# Patient Record
Sex: Male | Born: 1940
Health system: Southern US, Community
[De-identification: ages and names within clinical notes are randomized; demographics above are authoritative.]

## PROBLEM LIST (undated history)

## (undated) DIAGNOSIS — J42 Unspecified chronic bronchitis: Secondary | ICD-10-CM

## (undated) DIAGNOSIS — G4733 Obstructive sleep apnea (adult) (pediatric): Secondary | ICD-10-CM

## (undated) DIAGNOSIS — Z9289 Personal history of other medical treatment: Secondary | ICD-10-CM

## (undated) DIAGNOSIS — I10 Essential (primary) hypertension: Secondary | ICD-10-CM

## (undated) DIAGNOSIS — Z8719 Personal history of other diseases of the digestive system: Secondary | ICD-10-CM

## (undated) DIAGNOSIS — N183 Chronic kidney disease, stage 3 unspecified: Secondary | ICD-10-CM

## (undated) DIAGNOSIS — E669 Obesity, unspecified: Secondary | ICD-10-CM

## (undated) DIAGNOSIS — I5032 Chronic diastolic (congestive) heart failure: Secondary | ICD-10-CM

## (undated) DIAGNOSIS — Z9989 Dependence on other enabling machines and devices: Secondary | ICD-10-CM

## (undated) DIAGNOSIS — M4800 Spinal stenosis, site unspecified: Secondary | ICD-10-CM

## (undated) DIAGNOSIS — E119 Type 2 diabetes mellitus without complications: Secondary | ICD-10-CM

## (undated) DIAGNOSIS — E1142 Type 2 diabetes mellitus with diabetic polyneuropathy: Secondary | ICD-10-CM

## (undated) DIAGNOSIS — I219 Acute myocardial infarction, unspecified: Secondary | ICD-10-CM

## (undated) DIAGNOSIS — R9430 Abnormal result of cardiovascular function study, unspecified: Secondary | ICD-10-CM

## (undated) DIAGNOSIS — M545 Low back pain, unspecified: Secondary | ICD-10-CM

## (undated) DIAGNOSIS — M542 Cervicalgia: Secondary | ICD-10-CM

## (undated) DIAGNOSIS — K219 Gastro-esophageal reflux disease without esophagitis: Secondary | ICD-10-CM

## (undated) DIAGNOSIS — G629 Polyneuropathy, unspecified: Secondary | ICD-10-CM

## (undated) DIAGNOSIS — E785 Hyperlipidemia, unspecified: Secondary | ICD-10-CM

## (undated) DIAGNOSIS — M1990 Unspecified osteoarthritis, unspecified site: Secondary | ICD-10-CM

## (undated) DIAGNOSIS — I714 Abdominal aortic aneurysm, without rupture: Secondary | ICD-10-CM

## (undated) DIAGNOSIS — G8929 Other chronic pain: Secondary | ICD-10-CM

## (undated) DIAGNOSIS — N200 Calculus of kidney: Secondary | ICD-10-CM

## (undated) DIAGNOSIS — C4362 Malignant melanoma of left upper limb, including shoulder: Secondary | ICD-10-CM

## (undated) DIAGNOSIS — I2510 Atherosclerotic heart disease of native coronary artery without angina pectoris: Secondary | ICD-10-CM

## (undated) DIAGNOSIS — M48061 Spinal stenosis, lumbar region without neurogenic claudication: Secondary | ICD-10-CM

## (undated) DIAGNOSIS — R7989 Other specified abnormal findings of blood chemistry: Secondary | ICD-10-CM

## (undated) DIAGNOSIS — N289 Disorder of kidney and ureter, unspecified: Secondary | ICD-10-CM

## (undated) DIAGNOSIS — IMO0002 Reserved for concepts with insufficient information to code with codable children: Secondary | ICD-10-CM

## (undated) DIAGNOSIS — C4490 Unspecified malignant neoplasm of skin, unspecified: Secondary | ICD-10-CM

## (undated) DIAGNOSIS — Z87442 Personal history of urinary calculi: Secondary | ICD-10-CM

## (undated) DIAGNOSIS — K439 Ventral hernia without obstruction or gangrene: Secondary | ICD-10-CM

## (undated) HISTORY — DX: Reserved for concepts with insufficient information to code with codable children: IMO0002

## (undated) HISTORY — PX: CERVICAL DISC SURGERY: SHX588

## (undated) HISTORY — PX: NASAL SINUS SURGERY: SHX719

## (undated) HISTORY — DX: Unspecified malignant neoplasm of skin, unspecified: C44.90

## (undated) HISTORY — PX: KNEE ARTHROSCOPY: SUR90

## (undated) HISTORY — PX: CATARACT EXTRACTION W/ INTRAOCULAR LENS  IMPLANT, BILATERAL: SHX1307

## (undated) HISTORY — PX: CARPAL TUNNEL RELEASE: SHX101

## (undated) HISTORY — PX: BACK SURGERY: SHX140

## (undated) HISTORY — DX: Obesity, unspecified: E66.9

## (undated) HISTORY — DX: Ventral hernia without obstruction or gangrene: K43.9

## (undated) HISTORY — DX: Abnormal result of cardiovascular function study, unspecified: R94.30

## (undated) HISTORY — DX: Calculus of kidney: N20.0

## (undated) HISTORY — DX: Hyperlipidemia, unspecified: E78.5

## (undated) HISTORY — DX: Personal history of other diseases of the digestive system: Z87.19

## (undated) HISTORY — DX: Disorder of kidney and ureter, unspecified: N28.9

## (undated) HISTORY — PX: EYE SURGERY: SHX253

## (undated) HISTORY — DX: Atherosclerotic heart disease of native coronary artery without angina pectoris: I25.10

## (undated) HISTORY — DX: Essential (primary) hypertension: I10

---
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
## 1998-10-12 HISTORY — PX: CORONARY ANGIOPLASTY WITH STENT PLACEMENT: SHX49

## 1999-03-17 ENCOUNTER — Ambulatory Visit (HOSPITAL_COMMUNITY): Admission: RE | Admit: 1999-03-17 | Discharge: 1999-03-17 | Payer: Self-pay | Admitting: *Deleted

## 1999-03-17 ENCOUNTER — Encounter: Payer: Self-pay | Admitting: *Deleted

## 1999-05-24 ENCOUNTER — Encounter: Payer: Self-pay | Admitting: Emergency Medicine

## 1999-05-25 ENCOUNTER — Inpatient Hospital Stay (HOSPITAL_COMMUNITY): Admission: EM | Admit: 1999-05-25 | Discharge: 1999-05-28 | Payer: Self-pay | Admitting: Emergency Medicine

## 1999-07-24 ENCOUNTER — Ambulatory Visit: Admission: RE | Admit: 1999-07-24 | Discharge: 1999-07-24 | Payer: Self-pay | Admitting: Cardiology

## 1999-07-24 ENCOUNTER — Encounter (INDEPENDENT_AMBULATORY_CARE_PROVIDER_SITE_OTHER): Payer: Self-pay | Admitting: *Deleted

## 2000-07-28 ENCOUNTER — Inpatient Hospital Stay (HOSPITAL_COMMUNITY): Admission: EM | Admit: 2000-07-28 | Discharge: 2000-07-31 | Payer: Self-pay | Admitting: Emergency Medicine

## 2005-02-09 ENCOUNTER — Ambulatory Visit: Payer: Self-pay | Admitting: Cardiology

## 2005-02-11 ENCOUNTER — Ambulatory Visit: Payer: Self-pay | Admitting: Cardiology

## 2005-02-18 ENCOUNTER — Ambulatory Visit: Payer: Self-pay

## 2005-03-13 ENCOUNTER — Ambulatory Visit: Payer: Self-pay | Admitting: Cardiology

## 2005-03-19 ENCOUNTER — Ambulatory Visit: Payer: Self-pay | Admitting: Internal Medicine

## 2005-03-24 ENCOUNTER — Ambulatory Visit (HOSPITAL_COMMUNITY): Admission: RE | Admit: 2005-03-24 | Discharge: 2005-03-24 | Payer: Self-pay | Admitting: Internal Medicine

## 2005-04-09 ENCOUNTER — Ambulatory Visit: Payer: Self-pay | Admitting: Internal Medicine

## 2005-04-24 ENCOUNTER — Encounter: Admission: RE | Admit: 2005-04-24 | Discharge: 2005-04-24 | Payer: Self-pay | Admitting: Internal Medicine

## 2005-05-20 ENCOUNTER — Encounter: Admission: RE | Admit: 2005-05-20 | Discharge: 2005-05-20 | Payer: Self-pay | Admitting: Internal Medicine

## 2005-06-26 ENCOUNTER — Ambulatory Visit: Payer: Self-pay | Admitting: *Deleted

## 2005-06-26 ENCOUNTER — Inpatient Hospital Stay (HOSPITAL_COMMUNITY): Admission: EM | Admit: 2005-06-26 | Discharge: 2005-07-01 | Payer: Self-pay | Admitting: Emergency Medicine

## 2005-07-01 ENCOUNTER — Ambulatory Visit: Payer: Self-pay | Admitting: Emergency Medicine

## 2005-07-08 ENCOUNTER — Ambulatory Visit: Payer: Self-pay | Admitting: Cardiology

## 2005-07-16 ENCOUNTER — Ambulatory Visit: Payer: Self-pay | Admitting: Internal Medicine

## 2005-07-20 ENCOUNTER — Encounter: Admission: RE | Admit: 2005-07-20 | Discharge: 2005-07-20 | Payer: Self-pay | Admitting: Internal Medicine

## 2005-07-21 ENCOUNTER — Ambulatory Visit (HOSPITAL_BASED_OUTPATIENT_CLINIC_OR_DEPARTMENT_OTHER): Admission: RE | Admit: 2005-07-21 | Discharge: 2005-07-21 | Payer: Self-pay | Admitting: Emergency Medicine

## 2005-07-21 ENCOUNTER — Encounter: Payer: Self-pay | Admitting: Emergency Medicine

## 2005-07-23 ENCOUNTER — Ambulatory Visit: Payer: Self-pay | Admitting: Emergency Medicine

## 2005-08-04 ENCOUNTER — Ambulatory Visit: Payer: Self-pay | Admitting: Emergency Medicine

## 2005-08-31 ENCOUNTER — Ambulatory Visit (HOSPITAL_COMMUNITY): Admission: RE | Admit: 2005-08-31 | Discharge: 2005-08-31 | Payer: Self-pay | Admitting: Neurological Surgery

## 2006-02-08 ENCOUNTER — Ambulatory Visit: Payer: Self-pay | Admitting: Emergency Medicine

## 2006-03-01 ENCOUNTER — Ambulatory Visit: Payer: Self-pay | Admitting: Cardiology

## 2006-03-30 ENCOUNTER — Ambulatory Visit: Payer: Self-pay | Admitting: Cardiology

## 2006-07-01 ENCOUNTER — Ambulatory Visit: Payer: Self-pay | Admitting: Cardiology

## 2006-07-01 ENCOUNTER — Inpatient Hospital Stay (HOSPITAL_COMMUNITY): Admission: EM | Admit: 2006-07-01 | Discharge: 2006-07-03 | Payer: Self-pay | Admitting: Emergency Medicine

## 2006-07-12 ENCOUNTER — Ambulatory Visit: Payer: Self-pay

## 2006-07-15 ENCOUNTER — Ambulatory Visit: Payer: Self-pay | Admitting: Cardiology

## 2006-07-22 ENCOUNTER — Ambulatory Visit: Payer: Self-pay | Admitting: Cardiology

## 2006-07-22 LAB — CONVERTED CEMR LAB
BUN: 35 mg/dL — ABNORMAL HIGH (ref 6–23)
CO2: 30 meq/L (ref 19–32)
Calcium: 9.5 mg/dL (ref 8.4–10.5)
Chloride: 105 meq/L (ref 96–112)
GFR calc non Af Amer: 54 mL/min
Glomerular Filtration Rate, Af Am: 65 mL/min/{1.73_m2}

## 2006-07-26 ENCOUNTER — Ambulatory Visit: Payer: Self-pay | Admitting: Cardiology

## 2006-08-26 ENCOUNTER — Ambulatory Visit: Payer: Self-pay | Admitting: Cardiology

## 2006-09-17 ENCOUNTER — Ambulatory Visit: Payer: Self-pay | Admitting: Cardiology

## 2006-09-17 LAB — CONVERTED CEMR LAB
Albumin: 3.9 g/dL (ref 3.5–5.2)
Chol/HDL Ratio, serum: 5.7
HDL: 28.4 mg/dL — ABNORMAL LOW (ref >39.0)
LDL DIRECT: 95.3 mg/dL
Total Bilirubin: 0.8 mg/dL (ref 0.3–1.2)
Total Protein: 7.3 g/dL (ref 6.0–8.3)
Triglyceride fasting, serum: 203 mg/dL (ref 0–149)

## 2006-09-22 ENCOUNTER — Ambulatory Visit: Payer: Self-pay | Admitting: Cardiology

## 2006-12-28 ENCOUNTER — Ambulatory Visit: Payer: Self-pay | Admitting: Cardiology

## 2007-01-25 ENCOUNTER — Ambulatory Visit: Payer: Self-pay | Admitting: Emergency Medicine

## 2007-02-02 ENCOUNTER — Ambulatory Visit: Payer: Self-pay | Admitting: Cardiology

## 2007-03-17 ENCOUNTER — Ambulatory Visit: Payer: Self-pay | Admitting: Cardiology

## 2007-03-17 LAB — CONVERTED CEMR LAB
ALT: 31 units/L (ref 0–40)
AST: 22 units/L (ref 0–37)
Bilirubin, Direct: 0.1 mg/dL (ref 0.0–0.3)
Cholesterol: 205 mg/dL (ref 0–200)
Total Protein: 7.1 g/dL (ref 6.0–8.3)

## 2007-03-22 ENCOUNTER — Ambulatory Visit: Payer: Self-pay | Admitting: Cardiology

## 2007-04-06 ENCOUNTER — Ambulatory Visit: Payer: Self-pay | Admitting: Cardiology

## 2007-04-06 LAB — CONVERTED CEMR LAB
GFR calc Af Amer: 71 mL/min
GFR calc non Af Amer: 59 mL/min
Glucose, Bld: 176 mg/dL — ABNORMAL HIGH (ref 70–99)
Potassium: 3.7 meq/L (ref 3.5–5.1)
Sodium: 142 meq/L (ref 135–145)

## 2007-06-02 ENCOUNTER — Ambulatory Visit: Payer: Self-pay | Admitting: Cardiology

## 2007-09-21 ENCOUNTER — Telehealth: Payer: Self-pay | Admitting: Internal Medicine

## 2007-09-21 ENCOUNTER — Encounter: Payer: Self-pay | Admitting: Internal Medicine

## 2007-10-18 ENCOUNTER — Encounter: Payer: Self-pay | Admitting: Internal Medicine

## 2007-10-21 ENCOUNTER — Encounter: Payer: Self-pay | Admitting: Internal Medicine

## 2007-10-21 ENCOUNTER — Ambulatory Visit: Payer: Self-pay | Admitting: Internal Medicine

## 2007-10-21 DIAGNOSIS — M109 Gout, unspecified: Secondary | ICD-10-CM

## 2007-10-21 DIAGNOSIS — R5381 Other malaise: Secondary | ICD-10-CM

## 2007-10-21 DIAGNOSIS — R5383 Other fatigue: Secondary | ICD-10-CM

## 2007-10-21 DIAGNOSIS — Z87442 Personal history of urinary calculi: Secondary | ICD-10-CM

## 2007-10-21 DIAGNOSIS — K449 Diaphragmatic hernia without obstruction or gangrene: Secondary | ICD-10-CM | POA: Insufficient documentation

## 2007-10-21 DIAGNOSIS — Z85828 Personal history of other malignant neoplasm of skin: Secondary | ICD-10-CM

## 2007-10-21 DIAGNOSIS — J329 Chronic sinusitis, unspecified: Secondary | ICD-10-CM | POA: Insufficient documentation

## 2007-10-21 DIAGNOSIS — K439 Ventral hernia without obstruction or gangrene: Secondary | ICD-10-CM | POA: Insufficient documentation

## 2007-10-21 DIAGNOSIS — F329 Major depressive disorder, single episode, unspecified: Secondary | ICD-10-CM

## 2007-10-28 ENCOUNTER — Ambulatory Visit: Payer: Self-pay | Admitting: Gastroenterology

## 2007-11-11 ENCOUNTER — Ambulatory Visit: Payer: Self-pay | Admitting: Gastroenterology

## 2007-11-11 ENCOUNTER — Encounter: Payer: Self-pay | Admitting: Gastroenterology

## 2007-11-11 ENCOUNTER — Encounter: Payer: Self-pay | Admitting: Internal Medicine

## 2007-11-23 ENCOUNTER — Ambulatory Visit: Payer: Self-pay | Admitting: Cardiology

## 2007-12-06 ENCOUNTER — Telehealth: Payer: Self-pay | Admitting: Internal Medicine

## 2008-02-29 ENCOUNTER — Ambulatory Visit: Payer: Self-pay | Admitting: Cardiology

## 2008-02-29 LAB — CONVERTED CEMR LAB
ALT: 43 units/L (ref 0–53)
AST: 32 units/L (ref 0–37)
Albumin: 4.2 g/dL (ref 3.5–5.2)
BUN: 26 mg/dL — ABNORMAL HIGH (ref 6–23)
Basophils Relative: 0.4 % (ref 0.0–1.0)
Chloride: 101 meq/L (ref 96–112)
Creatinine, Ser: 1.6 mg/dL — ABNORMAL HIGH (ref 0.4–1.5)
Direct LDL: 72.8 mg/dL
Eosinophils Absolute: 0.1 10*3/uL (ref 0.0–0.7)
Eosinophils Relative: 2 % (ref 0.0–5.0)
GFR calc non Af Amer: 46 mL/min
HCT: 41.4 % (ref 39.0–52.0)
MCV: 93.4 fL (ref 78.0–100.0)
Neutrophils Relative %: 48.8 % (ref 43.0–77.0)
RBC: 4.43 M/uL (ref 4.22–5.81)
Total Protein: 7.9 g/dL (ref 6.0–8.3)
WBC: 5.8 10*3/uL (ref 4.5–10.5)

## 2008-03-09 ENCOUNTER — Ambulatory Visit: Payer: Self-pay

## 2008-04-09 ENCOUNTER — Ambulatory Visit: Payer: Self-pay | Admitting: Cardiology

## 2008-04-25 ENCOUNTER — Ambulatory Visit: Payer: Self-pay | Admitting: Internal Medicine

## 2008-04-25 DIAGNOSIS — R7989 Other specified abnormal findings of blood chemistry: Secondary | ICD-10-CM | POA: Insufficient documentation

## 2008-04-25 LAB — CONVERTED CEMR LAB: Hgb A1c MFr Bld: 10.1 % — ABNORMAL HIGH (ref 4.6–6.0)

## 2008-04-29 ENCOUNTER — Encounter: Payer: Self-pay | Admitting: Internal Medicine

## 2008-05-07 ENCOUNTER — Telehealth: Payer: Self-pay | Admitting: Internal Medicine

## 2008-05-10 ENCOUNTER — Encounter: Admission: RE | Admit: 2008-05-10 | Discharge: 2008-05-10 | Payer: Self-pay | Admitting: Internal Medicine

## 2008-05-11 ENCOUNTER — Encounter: Payer: Self-pay | Admitting: Internal Medicine

## 2008-05-21 ENCOUNTER — Telehealth: Payer: Self-pay | Admitting: Internal Medicine

## 2008-05-23 ENCOUNTER — Ambulatory Visit: Payer: Self-pay | Admitting: Internal Medicine

## 2008-07-25 ENCOUNTER — Ambulatory Visit: Payer: Self-pay | Admitting: Cardiology

## 2008-08-23 ENCOUNTER — Ambulatory Visit: Payer: Self-pay | Admitting: Internal Medicine

## 2008-08-26 ENCOUNTER — Telehealth: Payer: Self-pay | Admitting: Internal Medicine

## 2008-09-17 ENCOUNTER — Encounter: Payer: Self-pay | Admitting: Internal Medicine

## 2008-09-27 ENCOUNTER — Telehealth: Payer: Self-pay | Admitting: Internal Medicine

## 2009-01-22 DIAGNOSIS — R609 Edema, unspecified: Secondary | ICD-10-CM

## 2009-01-22 DIAGNOSIS — E663 Overweight: Secondary | ICD-10-CM

## 2009-01-23 ENCOUNTER — Encounter: Payer: Self-pay | Admitting: Cardiology

## 2009-01-23 ENCOUNTER — Ambulatory Visit: Payer: Self-pay | Admitting: Cardiology

## 2009-03-20 ENCOUNTER — Ambulatory Visit: Payer: Self-pay | Admitting: Cardiology

## 2009-05-30 ENCOUNTER — Ambulatory Visit: Payer: Self-pay | Admitting: Internal Medicine

## 2009-05-30 ENCOUNTER — Encounter: Payer: Self-pay | Admitting: Internal Medicine

## 2009-05-30 DIAGNOSIS — Z8709 Personal history of other diseases of the respiratory system: Secondary | ICD-10-CM | POA: Insufficient documentation

## 2009-05-30 DIAGNOSIS — J168 Pneumonia due to other specified infectious organisms: Secondary | ICD-10-CM

## 2009-05-30 DIAGNOSIS — R209 Unspecified disturbances of skin sensation: Secondary | ICD-10-CM

## 2009-05-30 LAB — CONVERTED CEMR LAB
Albumin: 3.9 g/dL (ref 3.5–5.2)
Alkaline Phosphatase: 88 units/L (ref 39–117)
BUN: 21 mg/dL (ref 6–23)
Basophils Absolute: 0 10*3/uL (ref 0.0–0.1)
Bilirubin, Direct: 0.1 mg/dL (ref 0.0–0.3)
CO2: 29 meq/L (ref 19–32)
Calcium: 9.3 mg/dL (ref 8.4–10.5)
Cholesterol: 163 mg/dL (ref 0–200)
Creatinine, Ser: 1.2 mg/dL (ref 0.4–1.5)
Eosinophils Absolute: 0.1 10*3/uL (ref 0.0–0.7)
Folate: 8.7 ng/mL
Glucose, Bld: 168 mg/dL — ABNORMAL HIGH (ref 70–99)
HDL goal, serum: 40 mg/dL
Hemoglobin, Urine: NEGATIVE
Hgb A1c MFr Bld: 6.7 % — ABNORMAL HIGH (ref 4.6–6.5)
Leukocytes, UA: NEGATIVE
Lymphocytes Relative: 27.1 % (ref 12.0–46.0)
MCHC: 34.7 g/dL (ref 30.0–36.0)
Monocytes Absolute: 0.6 10*3/uL (ref 0.1–1.0)
Neutrophils Relative %: 63.8 % (ref 43.0–77.0)
Nitrite: NEGATIVE
RDW: 13 % (ref 11.5–14.6)
Total Protein, Urine: NEGATIVE mg/dL
Triglycerides: 239 mg/dL — ABNORMAL HIGH (ref 0.0–149.0)
Vit D, 25-Hydroxy: 27 ng/mL — ABNORMAL LOW (ref 30–89)
Vitamin B-12: 355 pg/mL (ref 211–911)
pH: 5.5 (ref 5.0–8.0)

## 2009-05-31 ENCOUNTER — Encounter: Payer: Self-pay | Admitting: Internal Medicine

## 2009-06-05 ENCOUNTER — Telehealth: Payer: Self-pay | Admitting: Internal Medicine

## 2009-08-13 ENCOUNTER — Telehealth (INDEPENDENT_AMBULATORY_CARE_PROVIDER_SITE_OTHER): Payer: Self-pay | Admitting: *Deleted

## 2009-08-19 ENCOUNTER — Telehealth: Payer: Self-pay | Admitting: Internal Medicine

## 2009-09-17 ENCOUNTER — Encounter: Payer: Self-pay | Admitting: Cardiology

## 2009-09-18 ENCOUNTER — Ambulatory Visit: Payer: Self-pay | Admitting: Cardiology

## 2009-11-25 ENCOUNTER — Telehealth: Payer: Self-pay | Admitting: Cardiology

## 2009-12-24 ENCOUNTER — Ambulatory Visit: Payer: Self-pay | Admitting: Cardiology

## 2010-01-01 ENCOUNTER — Ambulatory Visit: Payer: Self-pay | Admitting: Cardiology

## 2010-01-22 LAB — CONVERTED CEMR LAB
Cholesterol: 165 mg/dL (ref 0–200)
Direct LDL: 85.9 mg/dL
HDL: 30.1 mg/dL — ABNORMAL LOW (ref >39.00)
Hgb A1c MFr Bld: 6.7 % — ABNORMAL HIGH (ref 4.6–6.5)
Total CHOL/HDL Ratio: 5
VLDL: 60.2 mg/dL — ABNORMAL HIGH (ref 0.0–40.0)

## 2010-03-06 ENCOUNTER — Ambulatory Visit: Payer: Self-pay | Admitting: Internal Medicine

## 2010-03-18 ENCOUNTER — Telehealth: Payer: Self-pay | Admitting: Internal Medicine

## 2010-08-04 ENCOUNTER — Ambulatory Visit: Payer: Self-pay | Admitting: Internal Medicine

## 2010-08-04 ENCOUNTER — Inpatient Hospital Stay (HOSPITAL_COMMUNITY): Admission: EM | Admit: 2010-08-04 | Discharge: 2010-08-08 | Payer: Self-pay | Admitting: Emergency Medicine

## 2010-08-04 ENCOUNTER — Ambulatory Visit: Payer: Self-pay | Admitting: Pulmonary Disease

## 2010-08-04 ENCOUNTER — Telehealth: Payer: Self-pay | Admitting: Cardiology

## 2010-08-05 ENCOUNTER — Ambulatory Visit: Payer: Self-pay | Admitting: Surgery

## 2010-08-05 ENCOUNTER — Encounter: Payer: Self-pay | Admitting: Internal Medicine

## 2010-08-07 ENCOUNTER — Encounter: Payer: Self-pay | Admitting: Pulmonary Disease

## 2010-08-15 ENCOUNTER — Telehealth (INDEPENDENT_AMBULATORY_CARE_PROVIDER_SITE_OTHER): Payer: Self-pay | Admitting: *Deleted

## 2010-08-20 ENCOUNTER — Encounter: Payer: Self-pay | Admitting: Pulmonary Disease

## 2010-08-25 ENCOUNTER — Ambulatory Visit: Payer: Self-pay | Admitting: Pulmonary Disease

## 2010-08-27 ENCOUNTER — Encounter: Payer: Self-pay | Admitting: Cardiology

## 2010-08-28 ENCOUNTER — Ambulatory Visit: Payer: Self-pay | Admitting: Cardiology

## 2010-08-28 LAB — CONVERTED CEMR LAB
CO2: 27 meq/L (ref 19–32)
Calcium: 9.7 mg/dL (ref 8.4–10.5)
Creatinine, Ser: 1.6 mg/dL — ABNORMAL HIGH (ref 0.4–1.5)
GFR calc non Af Amer: 46.94 mL/min (ref >60)
Sodium: 137 meq/L (ref 135–145)

## 2010-08-29 ENCOUNTER — Ambulatory Visit: Payer: Self-pay | Admitting: Internal Medicine

## 2010-09-02 ENCOUNTER — Encounter: Payer: Self-pay | Admitting: Internal Medicine

## 2010-09-02 LAB — CONVERTED CEMR LAB
Collection Interval-CRCL: 24 hr
Creatinine 24 HR UR: 2599 mg/24hr — ABNORMAL HIGH (ref 800–2000)
Creatinine, Urine: 104.2 mg/dL

## 2010-09-09 ENCOUNTER — Telehealth: Payer: Self-pay | Admitting: Internal Medicine

## 2010-09-14 ENCOUNTER — Encounter: Payer: Self-pay | Admitting: Internal Medicine

## 2010-09-28 ENCOUNTER — Encounter: Payer: Self-pay | Admitting: Pulmonary Disease

## 2010-11-11 NOTE — Assessment & Plan Note (Signed)
Summary: PER CHECK OUT/SF      Allergies Added:   Visit Type:  Follow-up Primary Provider:  Jacques Navy MD  CC:   CAD.  History of Present Illness: The patient is seen for followup of coronary artery disease.  He has not been having any chest pain.  He continues to work.  He has good LV function by history but he has not had an echo in many years.  We know that his LV function was good at the time of his cath.  He had nuclear scan in May of 2009 showing no ischemia.  I saw him last December, 2010.  At that time we decided to start him on fenofibrate.  He is tolerated well.  We will arrange for followup lipid testing.  Current Medications (verified): 1)  Furosemide 80 Mg Tabs (Furosemide) .... Take 1 Tablet By Mouth Two Times A Day 2)  Bayer Aspirin 325 Mg  Tabs (Aspirin) .... Take 1 Tablet By Mouth Once A Day 3)  Fish Oil 1000 Mg  Caps (Omega-3 Fatty Acids) .... Take 2 Tablets By Mouth Once A Day 4)  Protonix 40 Mg  Pack (Pantoprazole Sodium) .... Take 1 Tablet By Mouth Once A Day 5)  Tylenol Extra Strength 500 Mg  Tabs (Acetaminophen) .... Take 2 Tablet By Mouth Two Times A Day 6)  Diltiazem Hcl Cr 180 Mg  Cp24 (Diltiazem Hcl) .... Take 1 Tablet By Mouth Once A Day 7)  Atenolol 50 Mg  Tabs (Atenolol) .... Take 1 Tablet By Mouth Once A Day 8)  Imdur 30 Mg  Tb24 (Isosorbide Mononitrate) .... Take 1 Tablet By Mouth Once A Day 9)  Nitroglycerin 0.4 Mg Subl (Nitroglycerin) .... One Tablet Under Tongue Every 5 Minutes As Needed For Chest Pain---May Repeat Times Three 10)  Indomethacin 50 Mg  Caps (Indomethacin) .... Take 1 Tablet By Mouth Three Times A Day As Needed 11)  Allopurinol 300 Mg  Tabs (Allopurinol) .Marland Kitchen.. 1 Once Daily 12)  Amaryl 2 Mg  Tabs (Glimepiride) .Marland Kitchen.. 1 Two Times A Day 13)  Freestyle Freedom Lite W/device Kit (Blood Glucose Monitoring Suppl) .... Use Two Times A Day As Directed 14)  Freestyle Lite Test  Strp (Glucose Blood) .... Use Two Times A Day As Directed 15)   Vitamin D 1000 Unit  Tabs (Cholecalciferol) .... Once Daily 16)  Fenofibrate 54 Mg Tabs (Fenofibrate) .... Take One Tablet By Mouth Daily With A Meal  Allergies (verified): 1)  ! Codeine  Past History:  Past Medical History: Last updated: 12/24/2009 OVERWEIGHT/OBESITY (ICD-278.02) EDEMA (ICD-782.3) DIABETES MELLITUS, TYPE II (ICD-250.00) RENAL INSUFFICIENCY (ICD-588.9) Hx of FATIGUE (ICD-780.79) * NITRATE INTOLERANCE WITH HEADACHES NEPHROLITHIASIS, HX OF (ICD-V13.01) GOUT (ICD-274.9) DEPRESSION (ICD-311) SKIN CANCER, HX OF (ICD-V10.83) CAD.....STENT IN 2000..  /  ..cath  2006...stent patent   /   cath 2007...stent probably patent, but difficult to assess fully...  /  nuclear 02/2008...no ischemia SLEEP APNEA (ICD-780.57) VENTRAL HERNIA (ICD-553.20) HIATAL HERNIA (ICD-553.3) Hx of SINUSITIS (ICD-473.9) * BACK PROBLEMS * NASAL SURGERY HYPERTENSION (ICD-401.9) HYPERLIPIDEMIA (ICD-272.4) LV... good by history.... no echo data as of December 24, 2009 Obesity Volume overload  Pneumonia, hx of  Review of Systems       Patient denies fever, chills, headache, sweats, rash, change in vision, change in hearing, chest pain, cough, nausea vomiting, urinary symptoms.  He does have some old low back pain.  All other systems are reviewed and are negative.  Vital Signs:  Patient profile:  70 year old male Height:      70 inches Weight:      271 pounds BMI:     39.03 Pulse rate:   70 / minute BP sitting:   106 / 57  (right arm)  Vitals Entered By: Hardin Negus, RMA (December 24, 2009 2:01 PM)  Physical Exam  General:  patient is stable in general. Eyes:  no xanthelasma. Neck:  no jugular venous distention. Lungs:  lungs are clear.  Respiratory effort is nonlabored. Heart:  cardiac exam reveals S1-S2.  No clicks or significant murmurs. Abdomen:  abdomen is obese but soft. Extremities:  no peripheral edema. Psych:  patient is oriented to person time and place.  Affect is  normal.   Impression & Recommendations:  Problem # 1:  FLUID OVERLOAD (ICD-276.6) Fluid status is stable.  No change in therapy.  Problem # 2:  OVERWEIGHT/OBESITY (ICD-278.02) As always the patient needs to lose some weight.  Problem # 3:  HYPERTENSION (ICD-401.9) Blood pressure is under good control.  No change in therapy.  Problem # 4:  HYPERLIPIDEMIA (ICD-272.4)  His updated medication list for this problem includes:    Fenofibrate 54 Mg Tabs (Fenofibrate) .Marland Kitchen... Take one tablet by mouth daily with a meal Patient will have followup fasting lipid.  Problem # 5:  CORONARY ARTERY DISEASE WIT A STENT IN 2000 (ICD-414.00)  His updated medication list for this problem includes:    Bayer Aspirin 325 Mg Tabs (Aspirin) .Marland Kitchen... Take 1 tablet by mouth once a day    Diltiazem Hcl Cr 180 Mg Cp24 (Diltiazem hcl) .Marland Kitchen... Take 1 tablet by mouth once a day    Atenolol 50 Mg Tabs (Atenolol) .Marland Kitchen... Take 1 tablet by mouth once a day    Imdur 30 Mg Tb24 (Isosorbide mononitrate) .Marland Kitchen... Take 1 tablet by mouth once a day    Nitroglycerin 0.4 Mg Subl (Nitroglycerin) ..... One tablet under tongue every 5 minutes as needed for chest pain---may repeat times three Coronary disease is stable.  No further workup.  Patient Instructions: 1)  Follow up in 6 months

## 2010-11-11 NOTE — Miscellaneous (Signed)
  Clinical Lists Changes  Problems: Removed problem of LABORATORY EXAMINATION UNSPECIFIED (ICD-V72.60) Removed problem of * CATHETERIZATION IN THE FALL OF 2006 - most recently in September 2007 with a follow up Myoview showing no ischemia Added new problem of * D-DIMER ELEVATION  OCTOBER, 2011 Observations: Added new observation of PAST MED HX: OVERWEIGHT/OBESITY (ICD-278.02) EDEMA (ICD-782.3) DIABETES MELLITUS, TYPE II (ICD-250.00) RENAL INSUFFICIENCY (ICD-588.9)  CKD... stage III Hx of FATIGUE (ICD-780.79) * NITRATE INTOLERANCE WITH HEADACHES NEPHROLITHIASIS, HX OF (ICD-V13.01) GOUT (ICD-274.9) DEPRESSION (ICD-311) SKIN CANCER, HX OF (ICD-V10.83) CAD.....STENT IN 2000..  /  ..cath  2006...stent patent   /   cath 2007...stent probably patent, but difficult to assess fully...  /  nuclear 02/2008...no ischemia  /  catheterization August 06, 2010.. patent LAD stent with mild in-stent restenosis, moderate elevation LVEDP D-dimer   significant elevation.. hospital.. October, 2011.Marland Kitchen etiology unclear SLEEP APNEA (ICD-780.57) VENTRAL HERNIA (ICD-553.20) HIATAL HERNIA (ICD-553.3) Hx of SINUSITIS (ICD-473.9) * BACK PROBLEMS * NASAL SURGERY HYPERTENSION (ICD-401.9) HYPERLIPIDEMIA (ICD-272.4) EF 55%   echo.. October, 2011.. mild inferior hypokinesis Obesity Volume overload Pneumonia, hx of Obstructive sleep apnea   CPAP.. Dr. Craige Cotta Chest and back pain   Hospital... October, 2011... not cardiac... unexplained elevated d-dimer... CT of the chest, no coronary embolus or dissection.... VQ scan - normal....MRI of the spine, mild degenerative changes without spinal stenosis   foraminal narrowing on the right C5-C6 moderate degree D-dimer   significant elevation.  (20).. etiology unclear.. October, 2011... CT of the abdomen and pelvis, no acute abnormalities... chest CT, no obvious masses....venous Dopplers, no DVT Shortness of breath   October, 2011 .. mild restrictive defect on PFTs.Marland Kitchen likely due to  obesity and some volume overload... Dr. Craige Cotta this   (08/27/2010 11:03) Added new observation of REFERRING MD: Zackery Barefoot (08/27/2010 11:03) Added new observation of PRIMARY MD: Jacques Navy MD (08/27/2010 11:03)       Past History:  Past Medical History: OVERWEIGHT/OBESITY (ICD-278.02) EDEMA (ICD-782.3) DIABETES MELLITUS, TYPE II (ICD-250.00) RENAL INSUFFICIENCY (ICD-588.9)  CKD... stage III Hx of FATIGUE (ICD-780.79) * NITRATE INTOLERANCE WITH HEADACHES NEPHROLITHIASIS, HX OF (ICD-V13.01) GOUT (ICD-274.9) DEPRESSION (ICD-311) SKIN CANCER, HX OF (ICD-V10.83) CAD.....STENT IN 2000..  /  ..cath  2006...stent patent   /   cath 2007...stent probably patent, but difficult to assess fully...  /  nuclear 02/2008...no ischemia  /  catheterization August 06, 2010.. patent LAD stent with mild in-stent restenosis, moderate elevation LVEDP D-dimer   significant elevation.. hospital.. October, 2011.Marland Kitchen etiology unclear SLEEP APNEA (ICD-780.57) VENTRAL HERNIA (ICD-553.20) HIATAL HERNIA (ICD-553.3) Hx of SINUSITIS (ICD-473.9) * BACK PROBLEMS * NASAL SURGERY HYPERTENSION (ICD-401.9) HYPERLIPIDEMIA (ICD-272.4) EF 55%   echo.. October, 2011.. mild inferior hypokinesis Obesity Volume overload Pneumonia, hx of Obstructive sleep apnea   CPAP.. Dr. Craige Cotta Chest and back pain   Hospital... October, 2011... not cardiac... unexplained elevated d-dimer... CT of the chest, no coronary embolus or dissection.... VQ scan - normal....MRI of the spine, mild degenerative changes without spinal stenosis   foraminal narrowing on the right C5-C6 moderate degree D-dimer   significant elevation.  (20).. etiology unclear.. October, 2011... CT of the abdomen and pelvis, no acute abnormalities... chest CT, no obvious masses....venous Dopplers, no DVT Shortness of breath   October, 2011 .. mild restrictive defect on PFTs.Marland Kitchen likely due to obesity and some volume overload... Dr. Craige Cotta this

## 2010-11-11 NOTE — Letter (Signed)
    [facility] [address] [place], [place]  [address] Phone: [phone]      September 16, 2010   William Hendrix [address] [place], [place] [address]  RE:  LAB RESULTS  Dear  [name],  The following is an interpretation of your most recent lab tests.  Please take note of any instructions provided or changes to medications that have resulted from your lab work.    24 hour urine studies reveal normal renal (kidney) function with normal protein level and normal creatinine clearance.   Please come see me if you have any questions about these lab results.   Sincerely Yours,    Jacques Navy MD Tests: (1) Creatinine Clearance (29562) ! Creatinine                1.44 mg/dL                  0.40-1.50 ! Total Volume, Urine       2495 mL   Collection Interval       24 hours   Creatinine, Urine         104.2 mg/dL  Creatinine, 24 Hr Urine                        [H]  2599 mg/day                 [phone]   Creatinine Clearance      125 mL/min                  75-125     To convert to Grams, divide mg by 1000  Tests: (2) Total Protein, Urine Timed (13086) ! Total Volume, Urine       2495 mL ! Collection Interval       24 hours   Protein, 24 Hr Urine      50 mg/day                   50-100

## 2010-11-11 NOTE — Assessment & Plan Note (Signed)
Summary: POST HOSP/ [facility]   Vital Signs:  Patient profile:   70 year old male Height:      70 inches Weight:      263 pounds BMI:     37.87 O2 Sat:      96 % on Room air Temp:     98.1 degrees F oral Pulse rate:   54 / minute BP sitting:   110 / 62  (left arm) Cuff size:   large  Vitals Entered By: Bill Salinas CMA (August 29, 2010 10:50 AM)  O2 Flow:  Room air CC: hosp follow up/ ab   Primary Care Provider:  Jacques Navy MD  CC:  hosp follow up/ ab.  History of Present Illness: Patient presents for hospital follow-up. He had been admitted for chest pain to r/o MI vs aortic dissection.   Cardiac - Cardiac cath revealed open stent, no obstructive disease and normal LV function and normal aorta. He came to 2 d echo with EF 55%. He was ruled out by enzymes. He has seen Dr. Myrtis Ser since hospital D/C and is stable form cardiac perspective.  Elevated D-dimer at greater than 20. He had a negative CT angio. He had negative LE venous doppler study. He had a negative V/Q scan. He had a negative thromboembolic work-up. He was screened for occult malignancy with CT abdomen and pelvis which was negative except for incidental finding of fatty liver. CT Angio chest did not reveal any mass or abnormality. He has had no symptoms of malignancy - weight loss, night sweats, poor appetite, enlarged lymph nodes.  CKD - patient did have an elevated Creatinine in hospital with low calculated GFR. Reviewed labs to '07 and the creatinine has been 1.3 consistently until October '11 with a creatinine to 1.46 - to 1.39- to 1.3 - to 1.49 Oct 28th. Last creatinine was 1.6 Nov 17th.   All these issues were discussed at length with the patient. He and his wife's chief concern is the elevated D-dimer as a sign of possible malignancy and the diagnosis of CKD that was reported to them. He is free of chest pain and is doing generally well since being home.   Current Medications (verified): 1)  Furosemide 80 Mg Tabs  (Furosemide) .... Take 1 Tablet By Mouth Two Times A Day 2)  Bayer Aspirin 325 Mg  Tabs (Aspirin) .... Take 1 Tablet By Mouth Once A Day 3)  Fish Oil 1000 Mg  Caps (Omega-3 Fatty Acids) .... Take 2 Tablets By Mouth Once A Day 4)  Protonix 40 Mg  Pack (Pantoprazole Sodium) .... Take 1 Tablet By Mouth Once A Day 5)  Tylenol Extra Strength 500 Mg  Tabs (Acetaminophen) .... Take 2 Tablet By Mouth Two Times A Day 6)  Diltiazem Hcl Cr 180 Mg  Cp24 (Diltiazem Hcl) .... Take 1 Tablet By Mouth Once A Day 7)  Atenolol 50 Mg  Tabs (Atenolol) .... Take 1 Tablet By Mouth Once A Day 8)  Imdur 30 Mg  Tb24 (Isosorbide Mononitrate) .... Take 1 Tablet By Mouth Once A Day 9)  Nitroglycerin 0.4 Mg Subl (Nitroglycerin) .... One Tablet Under Tongue Every 5 Minutes As Needed For Chest Pain---May Repeat Times Three 10)  Allopurinol 300 Mg  Tabs (Allopurinol) .Marland Kitchen.. 1 Once Daily 11)  Amaryl 2 Mg  Tabs (Glimepiride) .Marland Kitchen.. 1 Two Times A Day 12)  Freestyle Freedom Lite W/device Kit (Blood Glucose Monitoring Suppl) .... Use Two Times A Day As Directed 13)  Freestyle  Lite Test  Strp (Glucose Blood) .... Use Two Times A Day As Directed 14)  Vitamin D 1000 Unit  Tabs (Cholecalciferol) .... Once Daily 15)  Fenofibrate 54 Mg Tabs (Fenofibrate) .... Take 1 Tablet By Mouth Two Times A Day With Meals  Allergies (verified): 1)  ! Codeine  Past History:  Past Medical History: Last updated: 08/28/2010 OVERWEIGHT/OBESITY (ICD-278.02) EDEMA (ICD-782.3) DIABETES MELLITUS, TYPE II (ICD-250.00) RENAL INSUFFICIENCY (ICD-588.9)  CKD... creatinine up to 1.5 in hospital after dye in October, 2011 Hx of FATIGUE (ICD-780.79) * NITRATE INTOLERANCE WITH HEADACHES NEPHROLITHIASIS, HX OF (ICD-V13.01) GOUT (ICD-274.9) DEPRESSION (ICD-311) SKIN CANCER, HX OF (ICD-V10.83) CAD.....STENT IN 2000..  /  ..cath  2006...stent patent   /   cath 2007...stent probably patent, but difficult to assess fully...  /  nuclear 02/2008...no ischemia  /   catheterization August 06, 2010.. patent LAD stent with mild in-stent restenosis, moderate elevation LVEDP D-dimer   significant elevation.. hospital.. October, 2011.Marland Kitchen etiology unclear SLEEP APNEA (ICD-780.57) VENTRAL HERNIA (ICD-553.20) HIATAL HERNIA (ICD-553.3) Hx of SINUSITIS (ICD-473.9) * BACK PROBLEMS * NASAL SURGERY HYPERTENSION (ICD-401.9) HYPERLIPIDEMIA (ICD-272.4) EF 55%   echo.. October, 2011.. mild inferior hypokinesis Obesity Volume overload Pneumonia, hx of Obstructive sleep apnea   CPAP.. Dr. Craige Cotta Chest and back pain   Hospital... October, 2011... not cardiac... unexplained elevated d-dimer... CT of the chest, no coronary embolus or dissection.... VQ scan - normal....MRI of the spine, mild degenerative changes without spinal stenosis   foraminal narrowing on the right C5-C6 moderate degree D-dimer   significant elevation.  (20).. etiology unclear.. October, 2011... CT of the abdomen and pelvis, no acute abnormalities... chest CT, no obvious masses....venous Dopplers, no DVT Shortness of breath   October, 2011 .. mild restrictive defect on PFTs.Marland Kitchen likely due to obesity and some volume overload... Dr. Craige Cotta this  Past Surgical History: Last updated: 10/21/2007 * CATHETERIZATION IN THE FALL OF 2006 CORONARY ARTERY DISEASE WITH A STENT IN 2000 (ICD-414.00) * NASAL SURGERY  Family History: Last updated: 01/22/2009 positive for prostate cancer, bladder cancer, renal cancer. Family History of Coronary Artery Disease:   Social History: Last updated: 01/22/2009 married works as a Education administrator. Tobacco Use - Former.  Alcohol Use - no  Review of Systems  The patient denies anorexia, fever, weight loss, decreased hearing, hoarseness, chest pain, dyspnea on exertion, peripheral edema, headaches, hemoptysis, abdominal pain, severe indigestion/heartburn, muscle weakness, suspicious skin lesions, unusual weight change, enlarged lymph nodes, and angioedema.    Physical  Exam  General:  WNWD white male in no distress Head:  Normocephalic and atraumatic without obvious abnormalities. No apparent alopecia or balding. Eyes:  vision grossly intact, pupils equal, pupils round, corneas and lenses clear, and no injection.   Neck:  supple.   Lungs:  normal respiratory effort, normal breath sounds, and no wheezes.   Heart:  normal rate and regular rhythm.   Abdomen:  obese, soft, BS+ Msk:  no joint tenderness, no joint warmth, and no redness over joints.   Pulses:  2+ radial Neurologic:  alert & oriented X3, cranial nerves II-XII intact, strength normal in all extremities, and gait normal.   Skin:  turgor normal, color normal, and no rashes.   Psych:  normally interactive, good eye contact, and not anxious appearing.     Impression & Recommendations:  Problem # 1:  CORONARY ARTERY DISEASE WIT A STENT IN 2000 (ICD-414.00) Stable with a complete work-up that did not reveal any progressive obstructive disease and a well preserved ejection fraction.  Plan - no further work-up planned by cardiology at this time.  His updated medication list for this problem includes:    Furosemide 80 Mg Tabs (Furosemide) .Marland Kitchen... Take 1 tablet by mouth two times a day    Bayer Aspirin 325 Mg Tabs (Aspirin) .Marland Kitchen... Take 1 tablet by mouth once a day    Diltiazem Hcl Cr 180 Mg Cp24 (Diltiazem hcl) .Marland Kitchen... Take 1 tablet by mouth once a day    Atenolol 50 Mg Tabs (Atenolol) .Marland Kitchen... Take 1 tablet by mouth once a day    Imdur 30 Mg Tb24 (Isosorbide mononitrate) .Marland Kitchen... Take 1 tablet by mouth once a day    Nitroglycerin 0.4 Mg Subl (Nitroglycerin) ..... One tablet under tongue every 5 minutes as needed for chest pain---may repeat times three  Problem # 2:  * D-DIMER ELEVATION  OCTOBER, 2011 Reveiwed all the studies done. Patient has no thromboembolic disease established. He has no symptoms t suggest thromboembolic disease. He had no malignancy identified by CT abd/pelvis and chest. He has had no  symptoms to suggest malignancy. Did a quick literature search: the association with malignancy and D-dimer is based on the hypercoagulable state seen in malignance (Trousseau's sign).  Plan - no further cancer screening. Patient does report he had a colonoscopy in the last 10 years that was normal           It may be reasonable to r/u coagulopaty: ATIII, Protein C&S, Leiden antifactor V deficiency - will discuss at next visit  Problem # 3:  DIABETES MELLITUS, TYPE II (ICD-250.00)  His updated medication list for this problem includes:    Bayer Aspirin 325 Mg Tabs (Aspirin) .Marland Kitchen... Take 1 tablet by mouth once a day    Amaryl 2 Mg Tabs (Glimepiride) .Marland Kitchen... 1 two times a day  Orders: T-Urine 24 Hr. Protein [phone]) T-Urine 24 Hr. Creatinine Clearance [phone])  Labs Reviewed: Creat: 1.6 (08/28/2010)     Last Eye Exam: normal (01/17/2009) Reviewed HgBA1c results: 6.7 (01/01/2010)  6.7 (05/30/2009)  Patient has been well controlled.! Will continue amaryl  Problem # 4:  RENAL INSUFFICIENCY (ICD-588.9) Mild renal insufficiency. May be some underlying diabetic glomerulonephropathy that was aggrevated by dye load. Explained renal insufficiency and DM-glomerulonephropathy to the patient and his wife (including cartoon)  Plan - 24 hour Urine for total protein and creatinine clearance.           based on results may prescribe ACE or ARB  Problem # 5:  HYPERTENSION (ICD-401.9)  His updated medication list for this problem includes:    Furosemide 80 Mg Tabs (Furosemide) .Marland Kitchen... Take 1 tablet by mouth two times a day    Diltiazem Hcl Cr 180 Mg Cp24 (Diltiazem hcl) .Marland Kitchen... Take 1 tablet by mouth once a day    Atenolol 50 Mg Tabs (Atenolol) .Marland Kitchen... Take 1 tablet by mouth once a day  BP today: 110/62 Prior BP: 106/58 (08/28/2010)  Prior 10 Yr Risk Heart Disease: N/A (05/30/2009)  Labs Reviewed: K+: 3.9 (08/28/2010) Creat: : 1.6 (08/28/2010)     Excellent control.   Complete Medication  List: 1)  Furosemide 80 Mg Tabs (Furosemide) .... Take 1 tablet by mouth two times a day 2)  Bayer Aspirin 325 Mg Tabs (Aspirin) .... Take 1 tablet by mouth once a day 3)  Fish Oil 1000 Mg Caps (Omega-3 fatty acids) .... Take 2 tablets by mouth once a day 4)  Protonix 40 Mg Pack (Pantoprazole sodium) .... Take 1 tablet by mouth once a day  5)  Tylenol Extra Strength 500 Mg Tabs (Acetaminophen) .... Take 2 tablet by mouth two times a day 6)  Diltiazem Hcl Cr 180 Mg Cp24 (Diltiazem hcl) .... Take 1 tablet by mouth once a day 7)  Atenolol 50 Mg Tabs (Atenolol) .... Take 1 tablet by mouth once a day 8)  Imdur 30 Mg Tb24 (Isosorbide mononitrate) .... Take 1 tablet by mouth once a day 9)  Nitroglycerin 0.4 Mg Subl (Nitroglycerin) .... One tablet under tongue every 5 minutes as needed for chest pain---may repeat times three 10)  Allopurinol 300 Mg Tabs (Allopurinol) .Marland Kitchen.. 1 once daily 11)  Amaryl 2 Mg Tabs (Glimepiride) .Marland Kitchen.. 1 two times a day 12)  Freestyle Freedom Lite W/device Kit (Blood glucose monitoring suppl) .... Use two times a day as directed 13)  Freestyle Lite Test Strp (Glucose blood) .... Use two times a day as directed 14)  Vitamin D 1000 Unit Tabs (Cholecalciferol) .... Once daily 15)  Fenofibrate 54 Mg Tabs (Fenofibrate) .... Take 1 tablet by mouth two times a day with meals   Orders Added: 1)  T-Urine 24 Hr. Protein [phone] 2)  T-Urine 24 Hr. Creatinine Clearance [[id number]] 3)  Est. Patient Level V [09811]

## 2010-11-11 NOTE — Progress Notes (Signed)
  Phone Note Refill Request Call back at Home Phone [phone] Message from:  Patient on March 18, 2010 1:07 PM  Refills Requested: Medication #1:  ALLOPURINOL 300 MG  TABS 1 once daily   Supply Requested: 3 months  Medication #2:  AMARYL 2 MG  TABS 1 two times a day   Supply Requested: 3 months pt  wife called requesting that refill for the above med are sent to [facility]  Initial call taken by: Rock Nephew CMA,  March 18, 2010 1:07 PM    Prescriptions: AMARYL 2 MG  TABS (GLIMEPIRIDE) 1 two times a day  #180 x 3   Entered by:   Rock Nephew CMA   Authorized by:   Jacques Navy MD   Signed by:   Rock Nephew CMA on 03/18/2010   Method used:   Faxed to ...       [facility]* (mail-order)             ,          Ph: [phone]       Fax: [phone]   RxID:   [id number] ALLOPURINOL 300 MG  TABS (ALLOPURINOL) 1 once daily  #90 x 3   Entered by:   Rock Nephew CMA   Authorized by:   Jacques Navy MD   Signed by:   Rock Nephew CMA on 03/18/2010   Method used:   Faxed to ...       [facility]* (mail-order)             ,          Ph: [phone]       Fax: [phone]   RxID:   [id number]

## 2010-11-11 NOTE — Progress Notes (Signed)
Summary: HFU needed with [facility]---SCHEDULED  ---- Converted from flag ---- ---- 08/08/2010 10:32 AM, Michel Bickers CMA wrote: Call to sch HFU with VS.  ---- 08/08/2010 10:16 AM, [name] MD wrote: Malvin Johns Mr. Los in the hospital.  He will likely be d/c home next week.  Can you call in the middle of next week to schedule a HFU visit 2 weeks after d/c home to f/u his dyspnea and sleep apnea.  Thanks. ------------------------------  Phone Note Outgoing Call   Call placed by: Michel Bickers CMA,  August 15, 2010 12:11 PM Call placed to: Patient Summary of Call: [facility]Michel Bickers CMA  August 15, 2010 12:11 PM  [facility]Michel Bickers CMA  August 18, 2010 4:45 PM  [facility]Michel Bickers CMA  August 21, 2010 10:39 AM    Follow-up for Phone Call        Pt's spouse called and scheduled hfu for monday, 08/25/2010 @ 2:15pm with VS. Follow-up by: Michel Bickers CMA,  August 22, 2010 9:05 AM

## 2010-11-11 NOTE — Progress Notes (Signed)
  Phone Note Refill Request      New/Updated Medications: FREESTYLE LANCETS  MISC (LANCETS) Test twice daily Prescriptions: FREESTYLE LANCETS  MISC (LANCETS) Test twice daily  #100 x 5   Entered by:   Rock Nephew CMA   Authorized by:   Etta Grandchild MD   Signed by:   Rock Nephew CMA on 09/09/2010   Method used:   Electronically to        [facility] [phone]* (retail)       [address]       [place], [place]  [address]       Ph: [phone]       Fax: [phone]   RxID:   [id number]

## 2010-11-11 NOTE — Progress Notes (Signed)
Summary: QUESTIONS ABOUT MEDICATION  Medications Added FUROSEMIDE 80 MG TABS (FUROSEMIDE) Take 1 tablet by mouth two times a day       Phone Note Call from Patient Call back at Home Phone [phone] Call back at [phone]   Caller: Spouse/[name] Summary of Call: WIFE HAVE QUESTIONS ABOUT THE PT PRESCRIPTION  FUROSEMIDE 40MG  Initial call taken by: Judie Grieve,  November 25, 2009 9:58 AM  Follow-up for Phone Call        left message Hardin Negus, RMA  November 25, 2009 3:22 PM   returning call, Migdalia Dk  November 26, 2009 10:04 AM   left message Hardin Negus, [place]  November 27, 2009 3:38 PM   Additional Follow-up for Phone Call Additional follow up Details #1::        wife states pt has been on lasix 80mg  two times a day for a long time and new rx was for 40mg , will send in new prescription Meredith Staggers, RN  November 28, 2009 9:34 AM     New/Updated Medications: FUROSEMIDE 80 MG TABS (FUROSEMIDE) Take 1 tablet by mouth two times a day Prescriptions: FUROSEMIDE 80 MG TABS (FUROSEMIDE) Take 1 tablet by mouth two times a day  #180 x 3   Entered by:   Meredith Staggers, RN   Authorized by:   Talitha Givens, MD, [facility]   Signed by:   Meredith Staggers, RN on 11/28/2009   Method used:   Electronically to        [facility]* (mail-order)             ,          Ph: [phone]       Fax: [phone]   RxID:   [id number]

## 2010-11-11 NOTE — Assessment & Plan Note (Signed)
Summary: William Hendrix      Allergies Added:   Visit Type:  post hospital visit Primary Provider:  Jacques Navy MD  CC:  chest pain.  History of Present Illness: The patient is seen back post hospitalization.  The chest discomfort and shortness of breath.  There was a component of volume overload.  He was diuresed.  There was also significant concern about the possibility of a pulmonary embolus.  During the hospitalization the patient had a chest CT with dye.  He also had a VQ scan that was normal.  Venous Dopplers revealed no DVT.  MRI of the spine revealed mild degenerative changes with no spinal stenosis.  There was foraminal narrowing right C5-C6 to moderate degree.  It was felt that this was probably not the basis of some of his chest discomfort.  He was seen by the pulmonary team and has been seen post hospital by Dr. Craige Cotta.  We are trying to get CPAP in place.  He is doing well.  He has lost 11 pounds and he is very motivated and working with his wife to continue to lose weight.  One remaining issue is the unexplained elevated d-dimer in the range of 20 in the hospital.  This was repeated at the same level.  We do not think this is from pulmonary was.  There is some concern of an undiagnosed malignancy.  This has not been seen either on a chest CT or abdominal CT.  Patient will discuss this issue further with Dr. Debby Bud.  As part of today's evaluation I carefully reviewed all of the hospital records at great length.  The past medical history has been carefully updated  I failed to mention in the first  That the patient did undergo cardiac catheterization.  The stent to the LAD was patent and there was mild in-stent restenosis.  His LVDP was elevated compatible with his volume overload at that time.  Current Medications (verified): 1)  Furosemide 80 Mg Tabs (Furosemide) .... Take 1 Tablet By Mouth Two Times A Day 2)  Bayer Aspirin 325 Mg  Tabs (Aspirin) .... Take 1 Tablet By Mouth Once A  Day 3)  Fish Oil 1000 Mg  Caps (Omega-3 Fatty Acids) .... Take 2 Tablets By Mouth Once A Day 4)  Protonix 40 Mg  Pack (Pantoprazole Sodium) .... Take 1 Tablet By Mouth Once A Day 5)  Tylenol Extra Strength 500 Mg  Tabs (Acetaminophen) .... Take 2 Tablet By Mouth Two Times A Day 6)  Diltiazem Hcl Cr 180 Mg  Cp24 (Diltiazem Hcl) .... Take 1 Tablet By Mouth Once A Day 7)  Atenolol 50 Mg  Tabs (Atenolol) .... Take 1 Tablet By Mouth Once A Day 8)  Imdur 30 Mg  Tb24 (Isosorbide Mononitrate) .... Take 1 Tablet By Mouth Once A Day 9)  Nitroglycerin 0.4 Mg Subl (Nitroglycerin) .... One Tablet Under Tongue Every 5 Minutes As Needed For Chest Pain---May Repeat Times Three 10)  Allopurinol 300 Mg  Tabs (Allopurinol) .Marland Kitchen.. 1 Once Daily 11)  Amaryl 2 Mg  Tabs (Glimepiride) .Marland Kitchen.. 1 Two Times A Day 12)  Freestyle Freedom Lite W/device Kit (Blood Glucose Monitoring Suppl) .... Use Two Times A Day As Directed 13)  Freestyle Lite Test  Strp (Glucose Blood) .... Use Two Times A Day As Directed 14)  Vitamin D 1000 Unit  Tabs (Cholecalciferol) .... Once Daily 15)  Fenofibrate 54 Mg Tabs (Fenofibrate) .... Take 1 Tablet By Mouth Two Times A Day With  Meals  Allergies (verified): 1)  ! Codeine  Past History:  Past Medical History: OVERWEIGHT/OBESITY (ICD-278.02) EDEMA (ICD-782.3) DIABETES MELLITUS, TYPE II (ICD-250.00) RENAL INSUFFICIENCY (ICD-588.9)  CKD... creatinine up to 1.5 in hospital after dye in October, 2011 Hx of FATIGUE (ICD-780.79) * NITRATE INTOLERANCE WITH HEADACHES NEPHROLITHIASIS, HX OF (ICD-V13.01) GOUT (ICD-274.9) DEPRESSION (ICD-311) SKIN CANCER, HX OF (ICD-V10.83) CAD.....STENT IN 2000..  /  ..cath  2006...stent patent   /   cath 2007...stent probably patent, but difficult to assess fully...  /  nuclear 02/2008...no ischemia  /  catheterization August 06, 2010.. patent LAD stent with mild in-stent restenosis, moderate elevation LVEDP D-dimer   significant elevation.. hospital.. October,  2011.Marland Kitchen etiology unclear SLEEP APNEA (ICD-780.57) VENTRAL HERNIA (ICD-553.20) HIATAL HERNIA (ICD-553.3) Hx of SINUSITIS (ICD-473.9) * BACK PROBLEMS * NASAL SURGERY HYPERTENSION (ICD-401.9) HYPERLIPIDEMIA (ICD-272.4) EF 55%   echo.. October, 2011.. mild inferior hypokinesis Obesity Volume overload Pneumonia, hx of Obstructive sleep apnea   CPAP.. Dr. Craige Cotta Chest and back pain   Hospital... October, 2011... not cardiac... unexplained elevated d-dimer... CT of the chest, no coronary embolus or dissection.... VQ scan - normal....MRI of the spine, mild degenerative changes without spinal stenosis   foraminal narrowing on the right C5-C6 moderate degree D-dimer   significant elevation.  (20).. etiology unclear.. October, 2011... CT of the abdomen and pelvis, no acute abnormalities... chest CT, no obvious masses....venous Dopplers, no DVT Shortness of breath   October, 2011 .. mild restrictive defect on PFTs.Marland Kitchen likely due to obesity and some volume overload... Dr. Craige Cotta this  Review of Systems       Patient denies fever, chills, headache, sweats, rash, change in vision, change in hearing, chest pain, cough, nausea vomiting, urinary symptoms.  All of the systems are reviewed and are negative.  Vital Signs:  Patient profile:   70 year old male Height:      70 inches Weight:      260 pounds BMI:     37.44 Pulse rate:   55 / minute BP sitting:   106 / 58  (left arm) Cuff size:   large  Vitals Entered By: Hardin Negus, RMA (August 28, 2010 9:58 AM)  Physical Exam  General:  patient looks good today and has lost some weight. Head:  head is atraumatic. Eyes:  no xanthelasma. Neck:  no jugular venous distention. Chest Wall:  no chest wall tenderness. Lungs:  lungs are clear.  Respiratory effort is nonlabored. Heart:  cardiac exam reveals S1-S2.  No clicks or significant murmurs. Abdomen:  abdomen is soft. Msk:  no musculoskeletal deformities. Extremities:  no peripheral edema. Skin:   no skin rashes. Psych:  patient is oriented to person time and place. Affect is normal.  He is here with his wife today.   Impression & Recommendations:  Problem # 1:  * D-DIMER ELEVATION  OCTOBER, 2011  D-dimer will be checked again today.  We cannot explain why it was 20.  As noted his chest CT did not show any tumors in the abdominal CT did not show any marked abnormalities.  The patient will discuss with Dr.Norins whether other evaluation should be undertaken.  Orders: T-D-Dimer Fibrin Derivatives Quantitive [id number])  Problem # 2:  DYSPNEA (ICD-786.05)  His updated medication list for this problem includes:    Furosemide 80 Mg Tabs (Furosemide) .Marland Kitchen... Take 1 tablet by mouth two times a day    Bayer Aspirin 325 Mg Tabs (Aspirin) .Marland Kitchen... Take 1 tablet by mouth once a day  Diltiazem Hcl Cr 180 Mg Cp24 (Diltiazem hcl) .Marland Kitchen... Take 1 tablet by mouth once a day    Atenolol 50 Mg Tabs (Atenolol) .Marland Kitchen... Take 1 tablet by mouth once a day The patient is breathing much better.  He is following with Dr.Sood and will be receiving updated CPAP.  He also is losing weight and I'm sure that this will have a very significant effect.  Orders: TLB-BMP (Basic Metabolic Panel-BMET) (80048-METABOL) T-D-Dimer Fibrin Derivatives Quantitive [id number])  Problem # 3:  FLUID OVERLOAD (ICD-276.6) Volume status is stable at this time.  No change in therapy.  Problem # 4:  OVERWEIGHT/OBESITY (ICD-278.02) patient is quite motivated and definitely losing weight at this time.  Problem # 5:  RENAL INSUFFICIENCY (ICD-588.9) Chemistry level rechecked today.  Patient was told that he had renal failure.  His creatinine did go up to 1.5 in the hospital.  His baseline is in the range of 1.0.  He did receive dye in the catheter lab and with his chest CTA.  Hopefully his creatinine has normalized his baseline.  Problem # 6:  HYPERTENSION (ICD-401.9)  His updated medication list for this problem includes:     Furosemide 80 Mg Tabs (Furosemide) .Marland Kitchen... Take 1 tablet by mouth two times a day    Bayer Aspirin 325 Mg Tabs (Aspirin) .Marland Kitchen... Take 1 tablet by mouth once a day    Diltiazem Hcl Cr 180 Mg Cp24 (Diltiazem hcl) .Marland Kitchen... Take 1 tablet by mouth once a day    Atenolol 50 Mg Tabs (Atenolol) .Marland Kitchen... Take 1 tablet by mouth once a day  Orders: EKG w/ Interpretation (93000) Blood pressure control.  No change in therapy.  EKG is done today reviewed by me.  He has sinus rhythm  Patient Instructions: 1)  Labs today 2)  Your physician wants you to follow-up in:  6 months.  You will receive a reminder letter in the mail two months in advance. If you don't receive a letter, please call our office to schedule the follow-up appointment. Prescriptions: FENOFIBRATE 54 MG TABS (FENOFIBRATE) Take 1 tablet by mouth two times a day with meals  #180 x 3   Entered by:   Meredith Staggers, RN   Authorized by:   Talitha Givens, MD, [facility]   Signed by:   Meredith Staggers, RN on 08/28/2010   Method used:   Faxed to ...       [facility] (mail-order)             , [place]         Ph: [phone]       Fax: [phone]   RxID:   [id number] IMDUR 30 MG  TB24 (ISOSORBIDE MONONITRATE) Take 1 tablet by mouth once a day  #90 x 4   Entered by:   Meredith Staggers, RN   Authorized by:   Talitha Givens, MD, [facility]   Signed by:   Meredith Staggers, RN on 08/28/2010   Method used:   Faxed to ...       [facility] (mail-order)             , [place]         Ph: [phone]       Fax: [phone]   RxID:   [id number] ATENOLOL 50 MG  TABS (ATENOLOL) Take 1 tablet by mouth once a day  #90 x 4   Entered by:   Meredith Staggers, RN   Authorized by:   Talitha Givens, MD, [facility]  Signed by:   Meredith Staggers, RN on 08/28/2010   Method used:   Faxed to ...       [facility] (mail-order)             , [place]         Ph: [phone]       Fax: [phone]   RxID:   [id number] DILTIAZEM HCL CR 180 MG  CP24 (DILTIAZEM HCL) Take 1 tablet by mouth once a day   #90 x 4   Entered by:   Meredith Staggers, RN   Authorized by:   Talitha Givens, MD, [facility]   Signed by:   Meredith Staggers, RN on 08/28/2010   Method used:   Faxed to ...       [facility] (mail-order)             , [place]         Ph: [phone]       Fax: [phone]   RxID:   [id number] FUROSEMIDE 80 MG TABS (FUROSEMIDE) Take 1 tablet by mouth two times a day  #180 x 3   Entered by:   Meredith Staggers, RN   Authorized by:   Talitha Givens, MD, [facility]   Signed by:   Meredith Staggers, RN on 08/28/2010   Method used:   Faxed to ...       [facility] (mail-order)             , [place]         Ph: [phone]       Fax: [phone]   RxID:   [id number]

## 2010-11-11 NOTE — Miscellaneous (Signed)
  Clinical Lists Changes  Observations: Added new observation of PAST MED HX: OVERWEIGHT/OBESITY (ICD-278.02) EDEMA (ICD-782.3) DIABETES MELLITUS, TYPE II (ICD-250.00) RENAL INSUFFICIENCY (ICD-588.9) Hx of FATIGUE (ICD-780.79) * NITRATE INTOLERANCE WITH HEADACHES NEPHROLITHIASIS, HX OF (ICD-V13.01) GOUT (ICD-274.9) DEPRESSION (ICD-311) SKIN CANCER, HX OF (ICD-V10.83) CAD.....STENT IN 2000..  /  ..cath  2006...stent patent   /   cath 2007...stent probably patent, but difficult to assess fully...  /  nuclear 02/2008...no ischemia SLEEP APNEA (ICD-780.57) VENTRAL HERNIA (ICD-553.20) HIATAL HERNIA (ICD-553.3) Hx of SINUSITIS (ICD-473.9) * BACK PROBLEMS * NASAL SURGERY HYPERTENSION (ICD-401.9) HYPERLIPIDEMIA (ICD-272.4) LV... good by history.... no echo data as of December 24, 2009 Obesity Volume overload  Pneumonia, hx of  (12/24/2009 10:22) Added new observation of PRIMARY MD: Jacques Navy MD (12/24/2009 10:22)       Past History:  Past Medical History: OVERWEIGHT/OBESITY (ICD-278.02) EDEMA (ICD-782.3) DIABETES MELLITUS, TYPE II (ICD-250.00) RENAL INSUFFICIENCY (ICD-588.9) Hx of FATIGUE (ICD-780.79) * NITRATE INTOLERANCE WITH HEADACHES NEPHROLITHIASIS, HX OF (ICD-V13.01) GOUT (ICD-274.9) DEPRESSION (ICD-311) SKIN CANCER, HX OF (ICD-V10.83) CAD.....STENT IN 2000..  /  ..cath  2006...stent patent   /   cath 2007...stent probably patent, but difficult to assess fully...  /  nuclear 02/2008...no ischemia SLEEP APNEA (ICD-780.57) VENTRAL HERNIA (ICD-553.20) HIATAL HERNIA (ICD-553.3) Hx of SINUSITIS (ICD-473.9) * BACK PROBLEMS * NASAL SURGERY HYPERTENSION (ICD-401.9) HYPERLIPIDEMIA (ICD-272.4) LV... good by history.... no echo data as of December 24, 2009 Obesity Volume overload  Pneumonia, hx of

## 2010-11-11 NOTE — Miscellaneous (Signed)
Summary: Pulmonary function test   Pulmonary Function Test Date: 08/07/2010 Height (in.): 70 Gender: Male  Pre-Spirometry FVC    Value: 3.22 L/min   Pred: 4.50 L/min     % Pred: 71 % FEV1    Value: 2.31 L     Pred: 3.30 L     % Pred: 69 % FEV1/FVC  Value: 72 %     Pred: 74 %     % Pred: 96 % FEF 25-75  Value: 1.42 L/min   Pred: 2.50 L/min     % Pred: 56 %  Post-Spirometry FVC    Value: 2.96 L/min   Pred: 4.50 L/min     % Pred: 65 % FEV1    Value: 2.23 L     Pred: 3.30 L     % Pred: 67 % FEV1/FVC  Value: 75 %     Pred: 74 %     % Pred: 101 % FEF 25-75  Value: 1.04 L/min   Pred: 2.50 L/min     % Pred: 41 %  Lung Volumes TLC    Value: 5.02 L   % Pred: 70 % RV    Value: 1.54 L   % Pred: 61 % DLCO    Value: 22.47 %   % Pred: 68 % DLCO/VA  Value: 4.80 %   % Pred: 103 %  Clinical Lists Changes  Observations: Added new observation of DLCO/VA%EXP: 103 % (08/07/2010 1:01) Added new observation of DLCO/VA: 4.80 % (08/07/2010 1:01) Added new observation of DLCO % EXPEC: 68 % (08/07/2010 1:01) Added new observation of DLCO: 22.47 % (08/07/2010 1:01) Added new observation of RV % EXPECT: 61 % (08/07/2010 1:01) Added new observation of RV: 1.54 L (08/07/2010 1:01) Added new observation of TLC % EXPECT: 70 % (08/07/2010 1:01) Added new observation of TLC: 5.02 L (08/07/2010 1:01) Added new observation of FEF2575%EXPS: 41 % (08/07/2010 1:01) Added new observation of PSTFEF25/75P: 2.50  (08/07/2010 1:01) Added new observation of PSTFEF25/75%: 1.04 L/min (08/07/2010 1:01) Added new observation of PSTFEV1/FCV%: 101 % (08/07/2010 1:01) Added new observation of FEV1FVCPRDPS: 74 % (08/07/2010 1:01) Added new observation of PSTFEV1/FVC: 75 % (08/07/2010 1:01) Added new observation of POSTFEV1%PRD: 67 % (08/07/2010 1:01) Added new observation of FEV1PRDPST: 3.30 L (08/07/2010 1:01) Added new observation of POST FEV1: 2.23 L/min (08/07/2010 1:01) Added new observation of POST FVC%EXP: 65 %  (08/07/2010 1:01) Added new observation of FVCPRDPST: 4.50 L/min (08/07/2010 1:01) Added new observation of POST FVC: 2.96 L (08/07/2010 1:01) Added new observation of FEF % EXPEC: 56 % (08/07/2010 1:01) Added new observation of FEF25-75%PRE: 2.50 L/min (08/07/2010 1:01) Added new observation of FEF 25-75%: 1.42 L/min (08/07/2010 1:01) Added new observation of FEV1/FVC%EXP: 96 % (08/07/2010 1:01) Added new observation of FEV1/FVC PRE: 74 % (08/07/2010 1:01) Added new observation of FEV1/FVC: 72 % (08/07/2010 1:01) Added new observation of FEV1 % EXP: 69 % (08/07/2010 1:01) Added new observation of FEV1 PREDICT: 3.30 L (08/07/2010 1:01) Added new observation of FEV1: 2.31 L (08/07/2010 1:01) Added new observation of FVC % EXPECT: 71 % (08/07/2010 1:01) Added new observation of FVC PREDICT: 4.50 L (08/07/2010 1:01) Added new observation of FVC: 3.22 L (08/07/2010 1:01) Added new observation of PFT HEIGHT: 70  (08/07/2010 1:01) Added new observation of PFT DATE: 08/07/2010  (08/07/2010 1:01)

## 2010-11-11 NOTE — Assessment & Plan Note (Signed)
Summary: hospital follow-up/LC   Visit Type:  Hospital Follow-up Copy to:  Zackery Barefoot Primary Provider/Referring Provider:  Jacques Navy MD  CC:  HFU...no breathing complaints today...doing well on CPAP...sleeping approx 7 hours with CPAP everynight.  History of Present Illness: 70 yo male with dyspnea, and OSA.  He is hear for hospital follow up.  His breathing has been doing better since he left the hospital.  He denies chest pain, wheeze, cough, sputum, fever, or hemoptysis.  He has been doing well with his CPAP.  He is sleeping well, and does not snore while wearing mask.   Current Medications (verified): 1)  Furosemide 80 Mg Tabs (Furosemide) .... Take 1 Tablet By Mouth Two Times A Day 2)  Bayer Aspirin 325 Mg  Tabs (Aspirin) .... Take 1 Tablet By Mouth Once A Day 3)  Fish Oil 1000 Mg  Caps (Omega-3 Fatty Acids) .... Take 2 Tablets By Mouth Once A Day 4)  Protonix 40 Mg  Pack (Pantoprazole Sodium) .... Take 1 Tablet By Mouth Once A Day 5)  Tylenol Extra Strength 500 Mg  Tabs (Acetaminophen) .... Take 2 Tablet By Mouth Two Times A Day 6)  Diltiazem Hcl Cr 180 Mg  Cp24 (Diltiazem Hcl) .... Take 1 Tablet By Mouth Once A Day 7)  Atenolol 50 Mg  Tabs (Atenolol) .... Take 1 Tablet By Mouth Once A Day 8)  Imdur 30 Mg  Tb24 (Isosorbide Mononitrate) .... Take 1 Tablet By Mouth Once A Day 9)  Nitroglycerin 0.4 Mg Subl (Nitroglycerin) .... One Tablet Under Tongue Every 5 Minutes As Needed For Chest Pain---May Repeat Times Three 10)  Allopurinol 300 Mg  Tabs (Allopurinol) .Marland Kitchen.. 1 Once Daily 11)  Amaryl 2 Mg  Tabs (Glimepiride) .Marland Kitchen.. 1 Two Times A Day 12)  Freestyle Freedom Lite W/device Kit (Blood Glucose Monitoring Suppl) .... Use Two Times A Day As Directed 13)  Freestyle Lite Test  Strp (Glucose Blood) .... Use Two Times A Day As Directed 14)  Vitamin D 1000 Unit  Tabs (Cholecalciferol) .... Once Daily 15)  Fenofibrate 54 Mg Tabs (Fenofibrate) .... Take 1 Tablet By Mouth Two Times A  Day With Meals  Allergies (verified): 1)  ! Codeine  Past History:  Past Medical History: OVERWEIGHT/OBESITY (ICD-278.02) EDEMA (ICD-782.3) DIABETES MELLITUS, TYPE II (ICD-250.00) RENAL INSUFFICIENCY (ICD-588.9) Hx of FATIGUE (ICD-780.79) * NITRATE INTOLERANCE WITH HEADACHES NEPHROLITHIASIS, HX OF (ICD-V13.01) GOUT (ICD-274.9) DEPRESSION (ICD-311) SKIN CANCER, HX OF (ICD-V10.83) CAD.....STENT IN 2000..  /  ..cath  2006...stent patent   /   cath 2007...stent probably patent, but difficult to assess fully...  /  nuclear 02/2008...no ischemia SLEEP APNEA (ICD-780.57) VENTRAL HERNIA (ICD-553.20) HIATAL HERNIA (ICD-553.3) Hx of SINUSITIS (ICD-473.9) * BACK PROBLEMS * NASAL SURGERY HYPERTENSION (ICD-401.9) HYPERLIPIDEMIA (ICD-272.4) LV... good by history.... no echo data as of December 24, 2009 Obesity Volume overload Pneumonia, hx of  Past Surgical History: Reviewed history from 10/21/2007 and no changes required. * CATHETERIZATION IN THE FALL OF 2006 CORONARY ARTERY DISEASE WITH A STENT IN 2000 (ICD-414.00) * NASAL SURGERY  Vital Signs:  Patient profile:   70 year old male Height:      70 inches (177.80 cm) Weight:      266 pounds (120.91 kg) BMI:     38.30 O2 Sat:      94 % on Room air Temp:     98.1 degrees F (36.72 degrees C) oral Pulse rate:   68 / minute BP sitting:   98 / 58  (  left arm) Cuff size:   large  Vitals Entered By: Michel Bickers CMA (August 25, 2010 2:23 PM)  O2 Sat at Rest %:  94 O2 Flow:  Room air CC: HFU...no breathing complaints today...doing well on CPAP...sleeping approx 7 hours with CPAP everynight Comments Medications reviewed with patient Michel Bickers CMA  August 25, 2010 2:32 PM   Physical Exam  General:  normal appearance, healthy appearing, and obese.   Nose:  no deformity, discharge, inflammation, or lesions Mouth:  MP 3, no exudate Neck:  no JVD.   Lungs:  clear bilaterally to auscultation and percussion Heart:  regular rhythm,  normal rate, and no murmurs.   Extremities:  trace pedal edema.   Neurologic:  normal CN II-XII and strength normal.   Cervical Nodes:  no significant adenopathy Psych:  alert and cooperative; normal mood and affect; normal attention span and concentration   Impression & Recommendations:  Problem # 1:  SLEEP APNEA (ICD-780.57) He has been doing better since he has been out of the hospital.  Will get a copy of his CPAP download to determine if his pressure needs to be adjusted.  Will also see if he can get a new machine.  Problem # 2:  DYSPNEA (ICD-786.05) Likely related to volume overload and cardiac dysfunction.  This has improved.  He had recent extensive pulmonary evaluation while in hospital (PFT, V/Q scan, CT chest) which was unremarkable for a cause of his dyspnea.  He did have mild restrictive defect on PFT, but this is likely related to obesity and volume overload at the time of the test.  He does not need any additional pulmonary testing at this time.  Problem # 3:  LABORATORY EXAMINATION UNSPECIFIED (ICD-V72.60) He was noted to have elevated D-Dimer while in the hospital.  Evaluation for this has been unrevealing to date.  He is to follow up with primary care for further assessment of this.  Complete Medication List: 1)  Furosemide 80 Mg Tabs (Furosemide) .... Take 1 tablet by mouth two times a day 2)  Bayer Aspirin 325 Mg Tabs (Aspirin) .... Take 1 tablet by mouth once a day 3)  Fish Oil 1000 Mg Caps (Omega-3 fatty acids) .... Take 2 tablets by mouth once a day 4)  Protonix 40 Mg Pack (Pantoprazole sodium) .... Take 1 tablet by mouth once a day 5)  Tylenol Extra Strength 500 Mg Tabs (Acetaminophen) .... Take 2 tablet by mouth two times a day 6)  Diltiazem Hcl Cr 180 Mg Cp24 (Diltiazem hcl) .... Take 1 tablet by mouth once a day 7)  Atenolol 50 Mg Tabs (Atenolol) .... Take 1 tablet by mouth once a day 8)  Imdur 30 Mg Tb24 (Isosorbide mononitrate) .... Take 1 tablet by mouth once a  day 9)  Nitroglycerin 0.4 Mg Subl (Nitroglycerin) .... One tablet under tongue every 5 minutes as needed for chest pain---may repeat times three 10)  Allopurinol 300 Mg Tabs (Allopurinol) .Marland Kitchen.. 1 once daily 11)  Amaryl 2 Mg Tabs (Glimepiride) .Marland Kitchen.. 1 two times a day 12)  Freestyle Freedom Lite W/device Kit (Blood glucose monitoring suppl) .... Use two times a day as directed 13)  Freestyle Lite Test Strp (Glucose blood) .... Use two times a day as directed 14)  Vitamin D 1000 Unit Tabs (Cholecalciferol) .... Once daily 15)  Fenofibrate 54 Mg Tabs (Fenofibrate) .... Take 1 tablet by mouth two times a day with meals  Other Orders: Est. Patient Level III (16109) DME Referral (DME)  Patient Instructions: 1)  Will check CPAP report 2)  Will check about getting a new CPAP machine 3)  Follow up in 6 months   Immunization History:  Influenza Immunization History:    Influenza:  historical (07/12/2010)   Appended Document: hospital follow-up/LC

## 2010-11-11 NOTE — Assessment & Plan Note (Signed)
Summary: KNEE AND HIP PAIN/ GOING ON FOR A WHILE/NWS   #   Vital Signs:  Patient profile:   70 year old male Height:      70 inches Weight:      269 pounds BMI:     38.74 O2 Sat:      96 % on Room air Temp:     97.2 degrees F oral Pulse rate:   66 / minute BP sitting:   112 / 62  (left arm) Cuff size:   large  Vitals Entered By: Bill Salinas CMA (Mar 06, 2010 2:03 PM)  O2 Flow:  Room air CC: pt here with c/o left knee giving out. He has problems when he tries to bend his left leg and occasionally has burning sensation that travels form left knee to hip up to his lower back region. Pt no longer takes indomethacin and needs 90 supply refills (Allopurinol, glimepiride) sent to [facility]   Primary Care Provider:  Jacques Navy MD  CC:  pt here with c/o left knee giving out. He has problems when he tries to bend his left leg and occasionally has burning sensation that travels form left knee to hip up to his lower back region. Pt no longer takes indomethacin and needs 90 supply refills (Allopurinol and glimepiride) sent to [facility].  History of Present Illness: Patient trouble with his left knee. It will lock out and has caused gait trouble. He also has pain at the left SI joint.   Current Medications (verified): 1)  Furosemide 80 Mg Tabs (Furosemide) .... Take 1 Tablet By Mouth Two Times A Day 2)  Bayer Aspirin 325 Mg  Tabs (Aspirin) .... Take 1 Tablet By Mouth Once A Day 3)  Fish Oil 1000 Mg  Caps (Omega-3 Fatty Acids) .... Take 2 Tablets By Mouth Once A Day 4)  Protonix 40 Mg  Pack (Pantoprazole Sodium) .... Take 1 Tablet By Mouth Once A Day 5)  Tylenol Extra Strength 500 Mg  Tabs (Acetaminophen) .... Take 2 Tablet By Mouth Two Times A Day 6)  Diltiazem Hcl Cr 180 Mg  Cp24 (Diltiazem Hcl) .... Take 1 Tablet By Mouth Once A Day 7)  Atenolol 50 Mg  Tabs (Atenolol) .... Take 1 Tablet By Mouth Once A Day 8)  Imdur 30 Mg  Tb24 (Isosorbide Mononitrate) .... Take 1 Tablet By Mouth Once A  Day 9)  Nitroglycerin 0.4 Mg Subl (Nitroglycerin) .... One Tablet Under Tongue Every 5 Minutes As Needed For Chest Pain---May Repeat Times Three 10)  Indomethacin 50 Mg  Caps (Indomethacin) .... Take 1 Tablet By Mouth Three Times A Day As Needed 11)  Allopurinol 300 Mg  Tabs (Allopurinol) .Marland Kitchen.. 1 Once Daily 12)  Amaryl 2 Mg  Tabs (Glimepiride) .Marland Kitchen.. 1 Two Times A Day 13)  Freestyle Freedom Lite W/device Kit (Blood Glucose Monitoring Suppl) .... Use Two Times A Day As Directed 14)  Freestyle Lite Test  Strp (Glucose Blood) .... Use Two Times A Day As Directed 15)  Vitamin D 1000 Unit  Tabs (Cholecalciferol) .... Once Daily 16)  Fenofibrate 54 Mg Tabs (Fenofibrate) .... Take 1 Tablet By Mouth Two Times A Day With Meals  Allergies (verified): 1)  ! Codeine  Past History:  Past Medical History: Last updated: 12/24/2009 OVERWEIGHT/OBESITY (ICD-278.02) EDEMA (ICD-782.3) DIABETES MELLITUS, TYPE II (ICD-250.00) RENAL INSUFFICIENCY (ICD-588.9) Hx of FATIGUE (ICD-780.79) * NITRATE INTOLERANCE WITH HEADACHES NEPHROLITHIASIS, HX OF (ICD-V13.01) GOUT (ICD-274.9) DEPRESSION (ICD-311) SKIN CANCER, HX OF (ICD-V10.83)  CAD.Marland Kitchen...STENT IN 2000..  /  ..cath  2006...stent patent   /   cath 2007...stent probably patent, but difficult to assess fully...  /  nuclear 02/2008...no ischemia SLEEP APNEA (ICD-780.57) VENTRAL HERNIA (ICD-553.20) HIATAL HERNIA (ICD-553.3) Hx of SINUSITIS (ICD-473.9) * BACK PROBLEMS * NASAL SURGERY HYPERTENSION (ICD-401.9) HYPERLIPIDEMIA (ICD-272.4) LV... good by history.... no echo data as of December 24, 2009 Obesity Volume overload  Pneumonia, hx of  Past Surgical History: Last updated: 10/21/2007 * CATHETERIZATION IN THE FALL OF 2006 CORONARY ARTERY DISEASE WITH A STENT IN 2000 (ICD-414.00) * NASAL SURGERY  Family History: Last updated: 01/22/2009 positive for prostate cancer, bladder cancer, renal cancer. Family History of Coronary Artery Disease:   Social  History: Last updated: 01/22/2009 married works as a Education administrator. Tobacco Use - Former.  Alcohol Use - no  Risk Factors: Smoking Status: quit (01/22/2009)  Review of Systems  The patient denies anorexia, fever, weight loss, weight gain, decreased hearing, hoarseness, syncope, peripheral edema, headaches, abdominal pain, and melena.    Physical Exam  General:  Overweight white male in NAD Head:  Normocephalic and atraumatic without obvious abnormalities. No apparent alopecia or balding. Msk:  nl stand, flex to 90 degrees; nl gait, toe/heel walk. Limited in step up with left leg due to knee pain. Nl SLR sitting, nl DTRs. NO CVAT tenderness.   Impression & Recommendations:  Problem # 1:  KNEE PAIN, LEFT, ACUTE (ICD-719.46)  pain and lock out. Feels like he will collapse but he has not fallen.   Plan - refer to Dr. Priscille Kluver.  His updated medication list for this problem includes:    Bayer Aspirin 325 Mg Tabs (Aspirin) .Marland Kitchen... Take 1 tablet by mouth once a day    Tylenol Extra Strength 500 Mg Tabs (Acetaminophen) .Marland Kitchen... Take 2 tablet by mouth two times a day    Indomethacin 50 Mg Caps (Indomethacin) .Marland Kitchen... Take 1 tablet by mouth three times a day as needed  Orders: Orthopedic Surgeon Referral (Ortho Surgeon)  Complete Medication List: 1)  Furosemide 80 Mg Tabs (Furosemide) .... Take 1 tablet by mouth two times a day 2)  Bayer Aspirin 325 Mg Tabs (Aspirin) .... Take 1 tablet by mouth once a day 3)  Fish Oil 1000 Mg Caps (Omega-3 fatty acids) .... Take 2 tablets by mouth once a day 4)  Protonix 40 Mg Pack (Pantoprazole sodium) .... Take 1 tablet by mouth once a day 5)  Tylenol Extra Strength 500 Mg Tabs (Acetaminophen) .... Take 2 tablet by mouth two times a day 6)  Diltiazem Hcl Cr 180 Mg Cp24 (Diltiazem hcl) .... Take 1 tablet by mouth once a day 7)  Atenolol 50 Mg Tabs (Atenolol) .... Take 1 tablet by mouth once a day 8)  Imdur 30 Mg Tb24 (Isosorbide mononitrate) .... Take 1 tablet by  mouth once a day 9)  Nitroglycerin 0.4 Mg Subl (Nitroglycerin) .... One tablet under tongue every 5 minutes as needed for chest pain---may repeat times three 10)  Indomethacin 50 Mg Caps (Indomethacin) .... Take 1 tablet by mouth three times a day as needed 11)  Allopurinol 300 Mg Tabs (Allopurinol) .Marland Kitchen.. 1 once daily 12)  Amaryl 2 Mg Tabs (Glimepiride) .Marland Kitchen.. 1 two times a day 13)  Freestyle Freedom Lite W/device Kit (Blood glucose monitoring suppl) .... Use two times a day as directed 14)  Freestyle Lite Test Strp (Glucose blood) .... Use two times a day as directed 15)  Vitamin D 1000 Unit Tabs (Cholecalciferol) .... Once daily 16)  Fenofibrate 54 Mg Tabs (Fenofibrate) .... Take 1 tablet by mouth two times a day with meals

## 2010-11-11 NOTE — Progress Notes (Signed)
Summary: pt chest pain   Phone Note Call from Patient Call back at [phone]   Caller: Spouse/ Reason for Call: Talk to Nurse, Talk to Doctor Summary of Call: pt has not been feeling well. He is still having chest pain and it gets worse and then better he took nitro last night and it helpped a little. Initial call taken by: Omer Jack,  August 04, 2010 4:15 PM  Follow-up for Phone Call        Pt. having CP over the last few days & took one ntg. SL which pt states he isnt sure if it helped b/c he fell asleep. Pt. currently having CP/SOB rating it between 5-6. He is also diaphoretic & SOB with activity. Pt. uses CPAP at night. He took two Ntg. SL tablets while on the phone with me & his CP did not get any better & he is describing it as very "tight." I have advised him to go to the ER for further eval. I will notify Trish & the ER that he is coming. Whitney Maeola Sarah RN  August 04, 2010 4:30 PM  Follow-up by: Whitney Maeola Sarah RN,  August 04, 2010 4:19 PM

## 2010-11-13 NOTE — Miscellaneous (Signed)
Summary: CPAP download 09/15/10 to 09/28/10   Clinical Lists Changes Used on 14 of 14 nights with average 6hrs 47 min.  With CPAP at 15 cm average AHI 0.2, and minimal airleak.  Results d/w pt's wife over the phone.  Explained that he has very good control of his sleep apnea with CPAP, and in fact could do trial of lowering CPAP pressure (likely to 13 or 14 cm H2O) if he desired.  Mrs. Hovis will d/w her husband and call back if he wants to try lowering CPAP setting.

## 2010-12-04 ENCOUNTER — Telehealth: Payer: Self-pay | Admitting: Internal Medicine

## 2010-12-09 NOTE — Progress Notes (Signed)
Summary: LOW CBG?   Phone Note Call from Patient Call back at [facility] Phone [phone] Call back at [phone]   Caller: Wife Summary of Call: Spoke w/pt's wife - Pt has lost 20 to 25 lbs over the last few mths. Am cbg's have gone from 130's and 140's to 90 to 100's. Pt is having low cbg's before lunch. Yesterday had 65 cbg and felt shaky before lunch. I advised he keep a snack w/him while out working - pt has had some more  Currently taking glimipride two times a day, should he decrease dose?  Initial call taken by: Lamar Sprinkles, CMA,  December 04, 2010 5:15 PM  Follow-up for Phone Call        Yes - reduce glimeperide to once a day. Med list changed Follow-up by: Jacques Navy MD,  December 04, 2010 5:45 PM  Additional Follow-up for Phone Call Additional follow up Details #1::        Pt informed  Additional Follow-up by: Lamar Sprinkles, CMA,  December 04, 2010 6:29 PM    New/Updated Medications: AMARYL 2 MG  TABS (GLIMEPIRIDE) 1 tab once daily

## 2010-12-24 LAB — HEPARIN LEVEL (UNFRACTIONATED)
Heparin Unfractionated: 0.11 IU/mL — ABNORMAL LOW (ref 0.30–0.70)
Heparin Unfractionated: 0.13 IU/mL — ABNORMAL LOW (ref 0.30–0.70)
Heparin Unfractionated: 0.4 IU/mL (ref 0.30–0.70)
Heparin Unfractionated: <0.1 IU/mL — ABNORMAL LOW (ref 0.30–0.70)

## 2010-12-24 LAB — CBC
HCT: 40.1 % (ref 39.0–52.0)
Hemoglobin: 12.6 g/dL — ABNORMAL LOW (ref 13.0–17.0)
Hemoglobin: 12.7 g/dL — ABNORMAL LOW (ref 13.0–17.0)
MCHC: 35.2 g/dL (ref 30.0–36.0)
MCV: 91.8 fL (ref 78.0–100.0)
MCV: 92.6 fL (ref 78.0–100.0)
Platelets: 155 10*3/uL (ref 150–400)
Platelets: 157 10*3/uL (ref 150–400)
RBC: 3.99 MIL/uL — ABNORMAL LOW (ref 4.22–5.81)
RBC: 4.01 MIL/uL — ABNORMAL LOW (ref 4.22–5.81)
RDW: 13.2 % (ref 11.5–15.5)
RDW: 13.5 % (ref 11.5–15.5)
WBC: 5.3 10*3/uL (ref 4.0–10.5)
WBC: 6.2 10*3/uL (ref 4.0–10.5)
WBC: 6.3 10*3/uL (ref 4.0–10.5)
WBC: 7.2 10*3/uL (ref 4.0–10.5)

## 2010-12-24 LAB — BASIC METABOLIC PANEL
BUN: 26 mg/dL — ABNORMAL HIGH (ref 6–23)
CO2: 28 mEq/L (ref 19–32)
CO2: 30 mEq/L (ref 19–32)
Calcium: 8.9 mg/dL (ref 8.4–10.5)
Calcium: 9.5 mg/dL (ref 8.4–10.5)
Calcium: 9.8 mg/dL (ref 8.4–10.5)
Chloride: 100 mEq/L (ref 96–112)
Chloride: 101 mEq/L (ref 96–112)
Chloride: 105 mEq/L (ref 96–112)
Creatinine, Ser: 1.4 mg/dL (ref 0.4–1.5)
GFR calc Af Amer: >60 mL/min (ref >60)
GFR calc Af Amer: >60 mL/min (ref >60)
GFR calc Af Amer: >60 mL/min (ref >60)
GFR calc non Af Amer: 52 mL/min — ABNORMAL LOW (ref >60)
Glucose, Bld: 137 mg/dL — ABNORMAL HIGH (ref 70–99)
Glucose, Bld: 147 mg/dL — ABNORMAL HIGH (ref 70–99)
Potassium: 3.9 mEq/L (ref 3.5–5.1)
Potassium: 4 mEq/L (ref 3.5–5.1)
Sodium: 138 mEq/L (ref 135–145)
Sodium: 139 mEq/L (ref 135–145)
Sodium: 139 mEq/L (ref 135–145)
Sodium: 141 mEq/L (ref 135–145)

## 2010-12-24 LAB — GLUCOSE, CAPILLARY
Glucose-Capillary: 106 mg/dL — ABNORMAL HIGH (ref 70–99)
Glucose-Capillary: 113 mg/dL — ABNORMAL HIGH (ref 70–99)
Glucose-Capillary: 128 mg/dL — ABNORMAL HIGH (ref 70–99)
Glucose-Capillary: 137 mg/dL — ABNORMAL HIGH (ref 70–99)
Glucose-Capillary: 139 mg/dL — ABNORMAL HIGH (ref 70–99)
Glucose-Capillary: 145 mg/dL — ABNORMAL HIGH (ref 70–99)
Glucose-Capillary: 159 mg/dL — ABNORMAL HIGH (ref 70–99)
Glucose-Capillary: 162 mg/dL — ABNORMAL HIGH (ref 70–99)
Glucose-Capillary: 175 mg/dL — ABNORMAL HIGH (ref 70–99)
Glucose-Capillary: 175 mg/dL — ABNORMAL HIGH (ref 70–99)
Glucose-Capillary: 182 mg/dL — ABNORMAL HIGH (ref 70–99)
Glucose-Capillary: 187 mg/dL — ABNORMAL HIGH (ref 70–99)
Glucose-Capillary: 187 mg/dL — ABNORMAL HIGH (ref 70–99)

## 2010-12-24 LAB — POCT CARDIAC MARKERS
CKMB, poc: <1 ng/mL — ABNORMAL LOW (ref 1.0–8.0)
Myoglobin, poc: 106 ng/mL (ref 12–200)

## 2010-12-24 LAB — CARDIAC PANEL(CRET KIN+CKTOT+MB+TROPI)
CK, MB: 1.4 ng/mL (ref 0.3–4.0)
CK, MB: 1.5 ng/mL (ref 0.3–4.0)
Relative Index: 1.4 (ref 0.0–2.5)
Relative Index: INVALID (ref 0.0–2.5)
Total CK: 108 U/L (ref 7–232)
Total CK: 99 U/L (ref 7–232)
Troponin I: <0.01 ng/mL (ref 0.00–0.06)

## 2010-12-24 LAB — CK TOTAL AND CKMB (NOT AT ARMC)
CK, MB: 1.7 ng/mL (ref 0.3–4.0)
Relative Index: 1.6 (ref 0.0–2.5)
Total CK: 104 U/L (ref 7–232)

## 2010-12-24 LAB — COMPREHENSIVE METABOLIC PANEL
Albumin: 3.4 g/dL — ABNORMAL LOW (ref 3.5–5.2)
Alkaline Phosphatase: 58 U/L (ref 39–117)
BUN: 28 mg/dL — ABNORMAL HIGH (ref 6–23)
Calcium: 9.3 mg/dL (ref 8.4–10.5)
Glucose, Bld: 165 mg/dL — ABNORMAL HIGH (ref 70–99)
Potassium: 3.7 mEq/L (ref 3.5–5.1)
Sodium: 140 mEq/L (ref 135–145)
Total Protein: 6.3 g/dL (ref 6.0–8.3)

## 2010-12-24 LAB — MRSA PCR SCREENING: MRSA by PCR: NEGATIVE

## 2010-12-24 LAB — DIFFERENTIAL
Basophils Absolute: 0 10*3/uL (ref 0.0–0.1)
Basophils Relative: 0 % (ref 0–1)
Monocytes Relative: 6 % (ref 3–12)
Neutro Abs: 3.3 10*3/uL (ref 1.7–7.7)
Neutrophils Relative %: 46 % (ref 43–77)

## 2010-12-24 LAB — LIPID PANEL
Cholesterol: 157 mg/dL (ref 0–200)
HDL: 28 mg/dL — ABNORMAL LOW (ref >39)
Total CHOL/HDL Ratio: 5.6 RATIO
Triglycerides: 506 mg/dL — ABNORMAL HIGH (ref <150)

## 2010-12-24 LAB — AMYLASE: Amylase: 57 U/L (ref 0–105)

## 2010-12-24 LAB — LIPASE, BLOOD
Lipase: 101 U/L — ABNORMAL HIGH (ref 11–59)
Lipase: 43 U/L (ref 11–59)

## 2010-12-24 LAB — D-DIMER, QUANTITATIVE
D-Dimer, Quant: >20 ug/mL-FEU — ABNORMAL HIGH (ref 0.00–0.48)
D-Dimer, Quant: >20 ug/mL-FEU — ABNORMAL HIGH (ref 0.00–0.48)

## 2010-12-24 LAB — PROTIME-INR: INR: 0.98 (ref 0.00–1.49)

## 2011-02-10 DIAGNOSIS — C4362 Malignant melanoma of left upper limb, including shoulder: Secondary | ICD-10-CM

## 2011-02-10 HISTORY — DX: Malignant melanoma of left upper limb, including shoulder: C43.62

## 2011-02-10 HISTORY — PX: MELANOMA EXCISION: SHX5266

## 2011-02-20 ENCOUNTER — Encounter: Payer: Self-pay | Admitting: Cardiology

## 2011-02-22 ENCOUNTER — Encounter: Payer: Self-pay | Admitting: Cardiology

## 2011-02-22 DIAGNOSIS — G4730 Sleep apnea, unspecified: Secondary | ICD-10-CM | POA: Insufficient documentation

## 2011-02-22 DIAGNOSIS — I10 Essential (primary) hypertension: Secondary | ICD-10-CM | POA: Insufficient documentation

## 2011-02-22 DIAGNOSIS — E785 Hyperlipidemia, unspecified: Secondary | ICD-10-CM | POA: Insufficient documentation

## 2011-02-22 DIAGNOSIS — Z79899 Other long term (current) drug therapy: Secondary | ICD-10-CM | POA: Insufficient documentation

## 2011-02-22 DIAGNOSIS — R9430 Abnormal result of cardiovascular function study, unspecified: Secondary | ICD-10-CM | POA: Insufficient documentation

## 2011-02-22 DIAGNOSIS — I25118 Atherosclerotic heart disease of native coronary artery with other forms of angina pectoris: Secondary | ICD-10-CM | POA: Insufficient documentation

## 2011-02-22 DIAGNOSIS — R0602 Shortness of breath: Secondary | ICD-10-CM | POA: Insufficient documentation

## 2011-02-22 DIAGNOSIS — G4733 Obstructive sleep apnea (adult) (pediatric): Secondary | ICD-10-CM | POA: Insufficient documentation

## 2011-02-22 DIAGNOSIS — N289 Disorder of kidney and ureter, unspecified: Secondary | ICD-10-CM | POA: Insufficient documentation

## 2011-02-22 DIAGNOSIS — R7989 Other specified abnormal findings of blood chemistry: Secondary | ICD-10-CM | POA: Insufficient documentation

## 2011-02-23 ENCOUNTER — Ambulatory Visit (INDEPENDENT_AMBULATORY_CARE_PROVIDER_SITE_OTHER): Payer: Medicare Other | Admitting: Cardiology

## 2011-02-23 ENCOUNTER — Encounter: Payer: Self-pay | Admitting: Cardiology

## 2011-02-23 DIAGNOSIS — R791 Abnormal coagulation profile: Secondary | ICD-10-CM

## 2011-02-23 DIAGNOSIS — I2510 Atherosclerotic heart disease of native coronary artery without angina pectoris: Secondary | ICD-10-CM

## 2011-02-23 DIAGNOSIS — R42 Dizziness and giddiness: Secondary | ICD-10-CM | POA: Insufficient documentation

## 2011-02-23 DIAGNOSIS — R0602 Shortness of breath: Secondary | ICD-10-CM

## 2011-02-23 DIAGNOSIS — R7989 Other specified abnormal findings of blood chemistry: Secondary | ICD-10-CM

## 2011-02-23 DIAGNOSIS — E119 Type 2 diabetes mellitus without complications: Secondary | ICD-10-CM

## 2011-02-23 LAB — D-DIMER, QUANTITATIVE: D-Dimer, Quant: >20 ug/mL-FEU — ABNORMAL HIGH (ref 0.00–0.48)

## 2011-02-23 MED ORDER — ASPIRIN EC 81 MG PO TBEC: 81.0000 mg | DELAYED_RELEASE_TABLET | Freq: Every day | ORAL | Status: DC

## 2011-02-23 NOTE — Assessment & Plan Note (Signed)
The patient had a significantly elevated d-dimer in October, 2011.  The etiology was never clear.  We will check this to see if it has remained elevated.

## 2011-02-23 NOTE — Assessment & Plan Note (Signed)
His blood pressure is running on the low side.  I'm hesitant to cut back his diuretic or his nitrate.  I am going to put his diltiazem on hold.  Although see him back for followup.

## 2011-02-23 NOTE — Assessment & Plan Note (Signed)
He did have some chest pain recently.  It is possible that there is an anginal component.  It has not persisted.  He underwent cardiac catheterization in October, 2011.  He had mild in-stent restenosis.  LVDP was elevated at that time.  EKG today does not show any acute changes.  I feel he does not need further aggressive workup at this time.

## 2011-02-23 NOTE — Assessment & Plan Note (Signed)
A shortness of breath is relatively stable.  Some of this may be due to good volume control.  I am hesitant to cut back on his diuretics.  He has not had labs checked for his diuretic therapy recently.  Chemistry will be obtained.

## 2011-02-23 NOTE — Patient Instructions (Signed)
Your physician recommends that you schedule a follow-up appointment in: 3 weeks with Dr. Myrtis Ser. Your physician has recommended you make the following change in your medication: Stop Diltiazem. Decrease aspirin to 81 mg by mouth daily.

## 2011-02-23 NOTE — Progress Notes (Signed)
HPI Patient is seen for cardiology followup.  I saw him last November, 2011.  He has continued to lose a few pounds.  With some recent holidays he's not done as well with his diet.  He mentions some mild dizziness at times.  He also has had some chest discomfort.  After lifting some bags and doing some yard work to [employer] he had some chest discomfort.  Is not having any nausea or diaphoresis. Allergies  Allergen Reactions  . Codeine     Current Outpatient Prescriptions  Medication Sig Dispense Refill  . allopurinol (ZYLOPRIM) 300 MG tablet Take 300 mg by mouth daily.        Marland Kitchen aspirin 325 MG EC tablet Take 325 mg by mouth daily.        Marland Kitchen atenolol (TENORMIN) 50 MG tablet Take 50 mg by mouth daily.        . Cholecalciferol (VITAMIN D) 1000 UNITS capsule Take 1,000 Units by mouth daily.        Marland Kitchen diltiazem (DILACOR XR) 180 MG 24 hr capsule Take 180 mg by mouth daily.        . fenofibrate 54 MG tablet Take 54 mg by mouth 2 (two) times daily.        Marland Kitchen glimepiride (AMARYL) 2 MG tablet Take 2 mg by mouth daily before breakfast.        . isosorbide mononitrate (IMDUR) 30 MG 24 hr tablet Take 30 mg by mouth daily.        . nitroGLYCERIN (NITROSTAT) 0.4 MG SL tablet Place 0.4 mg under the tongue every 5 (five) minutes as needed.        . Omega-3 Fatty Acids (FISH OIL) 1000 MG CAPS 2 tabs po qd       . pantoprazole (PROTONIX) 40 MG tablet Take 40 mg by mouth daily.          History   Social History  . Marital Status: Married    Spouse Name: N/A    Number of Children: N/A  . Years of Education: N/A   Occupational History  . Not on file.   Social History Main Topics  . Smoking status: Former Smoker    Quit date: 09/17/1996  . Smokeless tobacco: Not on file  . Alcohol Use: No  . Drug Use: Not on file  . Sexually Active: Not on file   Other Topics Concern  . Not on file   Social History Narrative  . No narrative on file    Family History  Problem Relation Age of Onset  .  Prostate cancer    . Kidney cancer    . Coronary artery disease      Past Medical History  Diagnosis Date  . Obesity   . Edema   . DM (diabetes mellitus)   . Renal insufficiency      CKD... creatinine up to 1.5 in hospital after dye in October, 2011  . Fatigue   . Nephrolithiasis   . Gout   . Depression   . Skin cancer   . CAD (coronary artery disease)     stent 2000 /  cath,stent patent 2006 / cath 2007,stent probably patenet ,but difficult to assess / cath 07/2010, patent LAD stent with mild instent restenosis, moderagte elevation LVEDP  . Sleep apnea     CPAP,  [name]  . Ventral hernia   . Sinusitis   . Back problem   . Hyperlipidemia   . HTN (hypertension)   .  OSA (obstructive sleep apnea)      CPAP.. Dr. Craige Cotta  . Chest pain     See data hospital 07/2010  . Drug therapy     Ntg intolerance  . Positive D dimer     significant elevation ,hospital 07/2010, etiology unclear  . Ejection fraction     55%, 07/2010, mild inferior hypo  . SOB (shortness of breath)     mild restrictive defect on PFTs...likely due to obesity and some volume overload    Past Surgical History  Procedure Date  . Nose surgery   . Cardiac catheterization     ROS  Patient denies fever, chills, headache, sweats, rash, change in vision, change in hearing, cough, nausea vomiting, urinary symptoms.  All of the systems are reviewed and are negative.  PHYSICAL EXAM Patient is oriented to person time and place.  Affect is normal.  He is here with his wife today.  Head is atraumatic.  There is no xanthelasma.  There is no jugular venous distention.  Lungs are clear.  Respiratory effort is nonlabored.  Cardiac exam reveals S1-S2.  There are no clicks or significant murmurs.  Abdomen is protuberant.  There are no musculoskeletal deformities.  There no skin rashes.  There is no significant peripheral edema. Filed Vitals:   02/23/11 1452  BP: 118/68  Pulse: 63  Resp: 18  Height: 5\' 11"  (1.803 m)    Weight: 258 lb (117.028 kg)    EKG  EKG is done today and reviewed by me.  He has normal sinus rhythm with nonspecific ST-T wave changes.  ASSESSMENT & PLAN

## 2011-02-24 LAB — HEMOGLOBIN A1C: Hgb A1c MFr Bld: 6.1 % (ref 4.6–6.5)

## 2011-02-24 NOTE — Assessment & Plan Note (Signed)
[facility]ALTHCARE                            CARDIOLOGY OFFICE NOTE   NAME:Dwiggins, CHANZ CAHALL                     MRN:          [id number]  DATE:02/29/2008                            DOB:          03-Dec-1940    HISTORY:  Mr. Riese is having some shortness of breath and chest  pain.  We know he has significant cardiac disease.  His last exercise  test was done in November 2007.  He is not having any significant  peripheral edema.  However, he does have some if he is wearing his boots  and up working all day long.  His exercise tolerance appears to be  limited at this time.  He is not having PND or orthopnea.  When he walks  or exercises, he gets shortness of breath and a tightness in his chest.  He has no radiation to his arms.  There is no nausea, vomiting,  diaphoresis.  He has no syncope or presyncope.   ALLERGIES:  CODEINE.   MEDICATIONS:  1. Lasix 80.  2. Allopurinol 300.  3. Trilipix (to be put on hold today).  4. Aspirin 325.  5. Fish oil.  6. Protonix.  7. Ibuprofen as needed.  8. Diltiazem 180.  9. Atenolol 50.  10.Imdur 30.   OTHER MEDICAL PROBLEMS:  See the list below.   REVIEW OF SYSTEMS:  He mentions also that he is having a tightness in  his legs with motion.  It is possible that he has some peripheral  arterial disease in his legs and we will proceed with arterial Dopplers  of his legs.  The patient also has generalized fatigue.  Otherwise  review of systems is negative.   PHYSICAL EXAMINATION:  VITAL SIGNS:  Weight is 267 pounds.  Blood  pressure 116/70 with a pulse of 65.  GENERAL:  The patient is oriented  to person, time and place.  Affect is normal.  He is here with his wife  today.  HEENT:  Reveals no xanthelasma.  Has normal extraocular motion.  NECK:  There are no carotid bruits.  There is no jugular venous  distention.  LUNGS:  Clear.  Respiratory effort is not labored.  CARDIAC:  Reveals S1-S2.  There are no clicks  or significant murmurs.  ABDOMEN:  Obese, but soft.  EXTREMITIES:  No significant peripheral edema today.   LABORATORY DATA:  No labs are available yet.  He will have studies done.   PROBLEMS:  1. History of nasal surgery.  2. Significant back problems, stable.  3. History of sinusitis.  4. Hypertension, treated.  5. History of ventral hernia  6. Sleep apnea on CPAP.  7. Coronary artery disease.  He had a stent in 2000.  Cath in 2006.      No ischemia in 2007.  We now need an adenosine Myoview and this      will be done in the near future.  8. History of nitrate intolerance, although he is tolerating low-dose      Imdur.  9. Hypercholesterolemia.  We tried him on Trilipix.  We need to be  sure that this is not causing problems.  CPK will be checked today      and he will hold his Trilipix until I see him back.  10.History of good left ventricular function.  11.Some fatigue and depression, and this may be playing a role at this      time.  12.Obesity.  He needs to lose weight.  13.History of renal stones.  14.History of gout.  15.History of some type of skin cancer with question of melanoma in      the past.  16.Volume status, this is stable.  17.History of family history of iron overload.  We checked him and      this is normal.   PLAN:  The patient needs multiple studies at this point.  He has not  eaten.  He will have a fasting lipid today.  There will also be a CPK.  Also CBC, TSH and CMET.  His Trilipix will be held.  He will have a  chest x-ray and we will arrange for leg Dopplers and adenosine Myoview  and then I will see him back.     [name], MD, [facility]  Electronically Signed    JDK/MedQ  DD: 02/29/2008  DT: 02/29/2008  Job #: [id number]

## 2011-02-24 NOTE — Assessment & Plan Note (Signed)
[facility]                            CARDIOLOGY OFFICE NOTE   JMICHAEL, GILLE                     MRN:          [id number]  DATE:04/09/2008                            DOB:          07/08/1941    William Hendrix is stable today.  I had seen him on Mar 09, 2008, and we  arranged for further evaluation.  I have labs from Feb 29, 2008.  He was  feeling poorly after we had started tried lipids.  This was stopped and  he felt better.  Labs were drawn on the day he was here.  It would  appear that to try lipid was not effectively treating his triglycerides  anyway.  It is of note that on that day his glucose was significantly  elevated.  We will arrange for him to see Dr. Debby Bud for early followup  to make sure that there is an ongoing significant diabetes.   When I saw him last, we proceeded with an arterial leg Doppler to be  sure he had no major disease and this showed no significant  abnormalities.  He had a stress Myoview scan.  There is question of a  very small abnormality, but this is mild and there is no marked ischemia  and he can be followed medically.   Most recently he had significant cough and sputum production in an after  hours.  He was seen fine.  He was treated with antibiotics.  He is still  weak, but he is definitely improving.   PAST MEDICAL HISTORY:   ALLERGIES:  CODEINE.   MEDICATIONS INTOLERANCE:  The patient has had significant difficulties  with all cholesterol medicines.  He had CPK elevation from Lipitor.  he  felt poorly with Zetia and he had felt better when Zetia was stopped.  He does not tolerate Niaspan, most recently he felt very poorly with try  lipids.  It is of note that he when he felt poorly with try lipids on  Feb 29, 2008, CPK was checked and it was not elevated.   MEDICATIONS:  1. Aspirin 325 mg.  2. Fish oil.  3. Protonix.  4. Ibuprofen.  5. Diltiazem 180 daily.  6. Atenolol 50 mg.  7. Imdur 30  mg.  8. Lasix 80 mg daily.  9. Allopurinol 300 mg.   OTHER MEDICAL PROBLEMS:  See the list below.   REVIEW OF SYSTEMS:  He feels weak recovering from the bronchopneumonia  that he had.  However, otherwise, his review of systems is negative.   PHYSICAL EXAMINATION:  VITAL SIGNS:  Weight is 262 pounds.  Blood  pressure 131/76 with a pulse of 72.  The patient is oriented to person,  time, and place.  Affect is normal.  HEENT:  Reveals no xanthelasma.  He has normal extraocular motion.  There are no carotid bruits.  There is no jugular venous distention.  LUNGS:  Clear.  Respiratory effort is not labored.  CARDIAC:  Reveals S1-S2.  There are no clicks or significant murmurs.  ABDOMEN:  Obese, but soft.  EXTREMITIES:  He has no significant  peripheral edema.   PROBLEMS:  1. History of nasal surgery.  2. Significant back problems, stable.  3. History of sinusitis.  4. Hypertension treated.  5. History of ventral hernia.  6. Sleep apnea on CPAP.  7. Coronary artery disease.  Stent in 2000.  Cath in 2006.  No      ischemia in 2007.  Most recent Myoview done recently reveals no      marked abnormalities.  8. History of nitrate intolerance.  He is tolerating very low-dose      Imdur.  9. Hypercholesterolemia.  I believe his lipids maybe of possibly being      affected by diabetes.  He needs early follow up with Dr. Debby Bud for      this.  At this point, we have no choices in terms of medications      used for his lipids.  10.History of good left ventricular function.  11.History of some fatigue and depression and this is stable.  12.Obesity.  13.History of renal stones.  14.History of gout.  15.History of some type of skin cancer with question of melanoma in      the past.  16.Volume.  He is stable at this time.  17.Family history of iron overload.  We assessed him for this and it      was normal.   No change in his meds today.  Careful discussion was done with the  patient and his  wife.  We will look for fairly early followup with Dr.  Debby Bud for complete evaluation and specifically to find out if he has it  and if he should be treated for diabetes.     William Abed, MD, [facility]  Electronically Signed    JDK/MedQ  DD: 04/09/2008  DT: 04/10/2008  Job #: [id number]   cc:   [name], MD

## 2011-02-24 NOTE — Assessment & Plan Note (Signed)
[facility] HEALTHCARE                            CARDIOLOGY OFFICE NOTE   BERNICE, MULLIN                     MRN:          [id number]  DATE:03/22/2007                            DOB:          January 02, 1941    [name] is seen for followup.  I saw him last on February 02, 2007.  At that time, his blood pressure was relatively low.  We cut his  lisinopril from 10 mg to 5 mg.  He is actually feeling well.  His blood  pressure remains borderline low at 101/63 with a pulse of 60.  After  careful consideration and with combination of other changes, I think we  will stop his lisinopril for now.   He is not having any significant chest pain.  We do believe that he does  not tolerate Zetia, and he did not tolerate Lipitor in the past.  He is  tolerating his Imdur, and his headaches have gone away.   Labs were checked, and his LDL is 93.  HDL is 27, and his triglycerides  are 364.  We talked about this at great length, and we will add a low  dose of Niaspan and follow him.   PAST MEDICAL HISTORY:  Other medical problems, see my extensive list on  the note of December 28, 2006.   ALLERGIES:  CODEINE.   MEDICATIONS:  Aspirin, fish oil, Protonix, ibuprofen, atenolol (will  consider changing this to Coreg), Imdur 30, lisinopril 5 (to be held),  and Lasix 80 daily.   REVIEW OF SYSTEMS:  He is actually feeling well.  He is working outside  in the heat, and he knows to be very careful about this, otherwise his  review of systems is negative.   PHYSICAL EXAMINATION:  VITAL SIGNS:  Weight is 267 pounds, which is down  6 pounds.  Blood pressure 101/63 with a pulse of 60.  GENERAL:  Patient is oriented to person, time, and place.  Affect was  normal.  HEENT:  He has normal extraocular motions.  Conjunctivae are normal.  NECK:  There are no carotid bruits.  There is no jugular venous  distention.  LUNGS:  Clear.  Respiratory effort is not labored.  CARDIAC:  An S1 and  S2.  There are no clicks or significant murmurs.  ABDOMEN:  Soft but obese.  He has no peripheral edema today.   PROBLEMS:  1. History of nasal surgery.  2. Significant back problems.  3. History of sinusitis.  4. Hypertension.  For unknown reasons, he is now hypotensive, and his      lisinopril will be stopped.  5. History of ventral hernia.  6. Sleep apnea, on CPAP.  7. Coronary disease with a stent in 2000 and catheterization in 2006      and most recently in September, 2007 with a followup showing no      ischemia.  8. History of nitrate intolerance, although he is tolerating low-dose      Imdur.  9. Hypercholesterolemia.  He had CPK elevation on Lipitor.  He felt      poorly  on Zetia and better when the Zetia was stopped.  For now, we      will assume that he cannot take either.  With his low HDL and      elevated triglycerides, we will see if he can tolerate some Niaspan      and it will be started.  I instructed him concerning this.  10.Good left ventricular function.  11.Fatigue and depression:  Overall, this is somewhat better.  12.Obesity:  He does know to los weight.  13.History of renal stones.  14.History of gout.  15.History of some type of skin cancer in the past with question of      melanoma.  16.Volume status.  He appears to need the Lasix, and he tolerates it      well.  A BMET will be checked.  17.Question of family history of iron overload, according to his      wife.  He has been encouraged to have his ferritin level checked,      and we will draw it today.   We will make these changes and see him back in followup.     [name], MD, [facility]  Electronically Signed    JDK/MedQ  DD: 03/22/2007  DT: 03/23/2007  Job #: [id number]

## 2011-02-24 NOTE — Assessment & Plan Note (Signed)
[facility] HEALTHCARE                            CARDIOLOGY OFFICE NOTE   CHANG, TIGGS                     MRN:          [id number]  DATE:11/23/2007                            DOB:          1941/06/14    Mr. Alcocer is seen for cardiology follow-up.  He has known documented  disease.  I saw him last in August 2008 and he was stable.  Recently he  needed to get up a flight of stairs rapidly and he chose to try to run  up them.  When he got to the top he be began to have chest pain.  He had  significant pain for 20 minutes but ultimately he this resolved.  He  then when about activities as normal and has had no recurrence.   The patient as outlined has known coronary disease.  His most recent  cath was done in 2006.  He had a stent in 2000.  He does have residual  disease but not high-grade disease.  He has not had syncope or  presyncope.   PAST MEDICAL HISTORY:   ALLERGIES:  CODEINE.   MEDICATIONS:  1. Lasix 40.  2. Allopurinol 300.  3. Aspirin 325.  4. Fish oil.  5. Protonix,  6. Diltiazem.  7. Atenolol.  8. Imdur.   OTHER MEDICAL PROBLEMS:  See the list below.   REVIEW OF SYSTEMS:  He recently had an episode of gout affecting his  right foot.  This was quite painful but it is now stabilized.  Otherwise  his review of systems is negative.   PHYSICAL EXAM:  Weight is 268 pounds.  This is stable for him. Blood  pressure is 115/69 with a pulse of 80.  The patient is oriented to person, time and place.  Affect is normal.  He is here with his wife today.  HEENT:  Reveals no xanthelasma.  He has normal extraocular motion.  There are no carotid bruits.  There is no jugular venous distention.  LUNGS:  Clear.  Respiratory effort is not labored.  CARDIAC:  Reveals an S1 with an S2.  There are no clicks or significant  murmurs.  ABDOMEN:  Protuberant but soft.  He has no significant peripheral edema  today.   EKG reveals no significant  change.  He is in sinus rhythm.   PROBLEMS:  1. History of nasal surgery.  2. Significant back problem.  3. History of sinusitis.  4. Hypertension.  His blood pressure is under good control today.  5. History of ventral hernia.  6. Sleep apnea on CPAP.  7. Coronary disease with a stent in 2000 and cath in 2006 and no      ischemia in 2007.  8. History of nitrate intolerance, although he is now tolerating low-      dose Imdur.  9. Hypercholesterolemia.  He had CPK elevation on Lipitor.  He felt      poorly on Zetia. He does not tolerate Niaspan. No changes. Because      he has had low HDL and elevated triglycerides in the past, we could  consider him on one of the fibrate medications.  Trilipix  has been      approved for the use in this setting and there is no interaction      with other medications.  I will talk to him about this.  10.Good left ventricular function.  11.History of some fatigue and  depression that is stable.  12.Obesity.  He needs to lose weight.  13.History of renal stones.  14.History of gout.  15.Some type of skin cancer in the past with question of melanoma.  16.Volume status.  He needs Lasix, but he is stable.  17.Question a family history of iron overload.  We had checked a      ferritin level. It was normal.   I am concerned about his recent episode of chest pain.  However, it was  clearly with excess exertion for him and he has had no other symptoms.  I am comfortable continuing to follow him with his current medications.  Will talk to him about trying a cholesterol medicine and then I will see  him back for follow-up.     [name], MD, [facility]  Electronically Signed    JDK/MedQ  DD: 11/23/2007  DT: 11/25/2007  Job #: [id number]

## 2011-02-24 NOTE — Assessment & Plan Note (Signed)
[facility] HEALTHCARE                            CARDIOLOGY OFFICE NOTE   JOUSHUA, DUGAR                     MRN:          [id number]  DATE:06/02/2007                            DOB:          [birth year]    Mr. Crumpler is seen for followup.  See my note of March 22, 2007.  He is  doing well.  We tried Niaspan.  He could not tolerate it.  He had  significant diarrhea, and it has been stopped.  His lisinopril has been  stopped, and his blood pressure remains under good control.  His  ferritin level was checked and it is in the normal range.  BMET showed  that his renal function was stable.   He is feeling relatively well in general.   PAST MEDICAL HISTORY:   ALLERGIES:  CODEINE.   MEDICATIONS:  1. Aspirin.  2. Fish oil.  3. Protonix.  4. Ibuprofen.  5. Diltiazem.  6. Atenolol.  7. Imdur.  8. Lasix 80 daily.   OTHER MEDICAL PROBLEMS:  See the list from the note of March 22, 2007.   REVIEW OF SYSTEMS:  He is doing relatively well and has no major  complaints.   PHYSICAL EXAM:  Blood pressure 110/70, pulse 68.  The patient was oriented to person, time, and place and his affect is  normal.  He is overweight and he needs to lose weight.  LUNGS:  Clear.  Respiratory effort is not labored.  CARDIAC:  Reveals an S1 with an S2.  There are no clicks or significant  murmurs.  ABDOMEN:  Obese, but soft.  He has no significant peripheral edema  today.   PROBLEMS:  Listed numbers 1 through 39 on my note of March 22, 2007.  1. Hypercholesterolemia.  His CPK was elevated on Lipitor.  He felt      poorly on Zetia.  He does not tolerate Niaspan.  We will not start      any new medicines at this time, and we will follow him over time.      He needs to watch his diet and do the best he can.  2. Intolerance to NIASPAN.   As the patient got up to put his shirt on, he had a sharp chest pain.  This may well be the type of pain that he has had and I believe  that  this is not cardiac in origin.     [name], MD, [facility]  Electronically Signed    JDK/MedQ  DD: 06/02/2007  DT: 06/03/2007  Job #: [id number]

## 2011-02-24 NOTE — Assessment & Plan Note (Signed)
[facility]ALTHCARE                            CARDIOLOGY OFFICE NOTE   MALICK, NETZ                     MRN:          [id number]  DATE:07/25/2008                            DOB:          16-Dec-1940    [name] is doing well.  He has had some slight chest discomfort.  He had an increase at the time that he ran out of his Protonix.  Also,  he has done fair amount of physical work and he has had some  musculoskeletal problems which are being helped by his chiropractor.  He  does not appear to be having any significant angina.  I do follow him  for his coronary artery disease.  He has not had any syncope or  presyncope.  He has no palpitations.  His last cath was in 2006.  Most  recent Myoview in May 2009 revealed no significant ischemia.   PAST MEDICAL HISTORY:   ALLERGIES:  CODEINE.   MEDICATIONS:  Lasix 40, allopurinol, aspirin, fish oil, Protonix,  diltiazem, atenolol, and Imdur.   OTHER MEDICAL PROBLEMS:  See the extensive list on my note of April 09, 2008.   REVIEW OF SYSTEMS:  He has no GI or GU symptoms.  He has no fever.  He  is stable.  Otherwise, review of systems is negative.   PHYSICAL EXAMINATION:  VITAL SIGNS:  Blood pressure 116/64.  His pulse  is 64.  GENERAL:  The patient is oriented to person, time, and place.  Affect is  normal.  HEENT:  No xanthelasma.  He has normal extraocular motion.  There are no  carotid bruits.  There is no jugular venous distention.  LUNGS:  Clear.  Respiratory effort is not labored.  CARDIAC:  S1 and S2.  There are no clicks or significant murmurs.  ABDOMEN:  Protuberant but soft.  He has no significant peripheral edema.   EKG is reviewed and reveals no significant change.   Problems are listed on my note of April 09, 2008.  He has known coronary  artery disease but he is stable.  There will be no change in his meds.  I will see him back in 6 months.     William Abed, MD, [facility]  Electronically Signed    JDK/MedQ  DD: 07/25/2008  DT: 07/26/2008  Job #: [id number]

## 2011-02-27 NOTE — Cardiovascular Report (Signed)
NAME:  JUDSON, TSAN NO.:  [id number]   MEDICAL RECORD NO.:  [id number]          PATIENT TYPE:  INP   LOCATION:  3736                         FACILITY:  [facility]   PHYSICIAN:  Charlies Constable, M.D. [facility] DATE OF BIRTH:  08-27-1941   DATE OF PROCEDURE:  06/29/2005  DATE OF DISCHARGE:                              CARDIAC CATHETERIZATION   CLINICAL HISTORY:  Mr. 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  He had a stent placed in the left anterior descending artery  in 1991 by Dr. Gerri Spore.  He was admitted on Friday with an episode of  prolonged chest pain and shortness of breath which occurred early in the  morning.  He went to the fire department and was brought to [facility] [employer] [facility], I believe by EMS, and was still having pain on arrival.  His pain settled down and he was kept over the weekend for a catheterization  today.   DESCRIPTION OF PROCEDURE:  The procedure was performed via the right femoral  artery using arterial sheath and 6 French preformed coronary catheters.  We  had to use a Doppler needle to obtain access.  The procedure was performed  with 6 [place] preformed coronary catheters.  Distal aortogram was performed  to rule out abdominal aortic aneurysm.  We were unable to close the right  femoral artery due to high bifurcation.   RESULTS:  The aortic pressure was 119/69 with a mean of 89 and left  ventricular pressure was 119/17.   Left main coronary artery:  The left main coronary artery was free of  significant disease.   Left anterior descending coronary artery:  The left anterior descending  artery gave rise to three diagonal branches and a large and small septal  perforator.  There was less than 30% narrowing within the stent in the  proximal LAD.  Shifts to LAD was irregular and small caliber but had no  major obstruction.   Circumflex coronary artery:  The circumflex artery gave rise to a ramus  branch,  an atrial branch, a small marginal branch, a large marginal branch  and a large posterolateral branch.  This vessel was irregular but there was  no significant obstruction.   Right coronary artery:  The right coronary artery is a moderately large  vessel that gave rise to a right ventricular branch, a posterior descending  branch and three posterolateral branches.  There was 30% narrowing in the  mid vessel.   Left ventriculogram:  The left ventriculogram performed in the RAO  projection showed good wall motion with no areas of hypokinesis.  The  estimated ejection fraction was 60%.   Distal aortogram:  Distal aortogram was performed which showed patent right  renal artery and dual renal arteries in the left which were patent.  The  distal aorta was hepatic but there was no discrete aneurysm.   CONCLUSION:  Coronary artery disease status post prior stenting of the left  anterior descending with nonobstructive coronary artery disease with less  than 30% narrowing at the stent site  in the proximal left anterior  descending, irregularities in the distal left anterior descending and  circumflex artery, 30% narrowing in the mid right coronary artery and normal  left ventricular function.   RECOMMENDATIONS:  The stent is widely patent and there is only mild  nonobstructive coronary artery disease and there is no source of ischemia.  The etiology of the patient's symptoms are not clear.  His oxygen saturation  is  95% on room air and there is no predisposing factors to pulmonary embolism.  Will get a D-dimer.  If this is low, then will treat him for reflux symptoms  with a proton pump inhibitor.  Will plan discharge later today if his D-  dimer is normal.  Will arrange follow-up with Dr. Myrtis Ser.           ______________________________  Charlies Constable, M.D. [facility]     BB/MEDQ  D:  06/29/2005  T:  06/29/2005  Job:  [id number]   cc:   [name], M.D. [facility]  [address]   [place]  [place] [address]   [name], M.D.  [address]  [address]  [place]  [place] [address]   Cardiopulmonary Lab

## 2011-02-27 NOTE — Letter (Signed)
January 03, 2009    [employer] rights [employer]  Attention:  Lonn Georgia Request  [address]  [place], [place] [address].   Juror # [id number]   RE:  William Hendrix, William Hendrix  MRN:  [id number]  /  DOB:  1941/07/11   To whom it may concern:   [name] has significant coronary artery disease.  He has  intermittent chest discomfort.  I see him and help treat this on a  regular basis.  He also has problems with fluid overload and has to be  on diuretics for this.   My purpose in writing is to note that I feel that on a medical basis, it  will not be appropriate for [name] to have jury duty.  I am  writing to request that he be relieved from this.   Please do not hesitate to contact me for any questions.    Sincerely,      [name], MD, [facility]  Electronically Signed    JDK/MedQ  DD: 01/03/2009  DT: 01/03/2009  Job #: [id number]

## 2011-02-27 NOTE — Consult Note (Signed)
NAME:  William Hendrix, William Hendrix NO.:  [id number]   MEDICAL RECORD NO.:  [id number]          PATIENT TYPE:  INP   LOCATION:  3736                         FACILITY:  [facility]   PHYSICIAN:  [name], M.D.  DATE OF BIRTH:  1941-09-30   DATE OF CONSULTATION:  06/29/2005  DATE OF DISCHARGE:                                   CONSULTATION   HISTORY OF PRESENT ILLNESS:  Dola Argyle. Blissett is a 70 year old white male  born on 1940/12/22, with a history of coronary artery disease. This  patient has come to the hospital at this point with problems with increasing  shortness of breath, has had a prior LAD stent placement. The patient was re-  evaluated for coronary artery disease. The patient underwent a cardiac  catheterization on the 18th of September 2006, and was found to have minimal  coronary artery stenosis. The patient has a history of obstructive sleep  apnea followed by Dr. Shelle Iron, initiated in 2000, and really has not been re-  evaluated since that time.  The patient has had increasing problems with  fatigue, apathy, cannot be productive on the job. The patient has had some  increased frustration, nervousness, and depression. The patient has been  short tempered. The patient has noted decreasing tremors in the arms. He has  had a chronic tremor since the 1990s after a cervical spine surgery on the  left side, but now has mild tremor as well on the right.  This does not  severely impact his ability to use his arms however. The patient has had  some numbness in the feet that date back several months. He has had some  slight gait instability and imbalance. The patient does report some  nightsweats over the last two to three weeks. The patient comes to the  hospital at this point. Neurology is asked to see this patient for further  evaluation.   PAST MEDICAL HISTORY:  1.  History of coronary artery disease, status post catheterization today,      prior stent in the LAD,  minimal coronary artery disease noted at this      time.  2.  History of obstructive sleep apnea, on CPAP.  3.  History of hypertension.  4.  Hiatal hernia.  5.  Renal calculi.  6.  History of melanoma, status post resection.  7.  History of cervical spine surgery, C5-6 level.  8.  History of left cataract surgery.  9.  Gout.  10. Knee surgery in the 1980s.  11. History of obesity.   MEDICATIONS AT THIS TIME:  1.  Aspirin 325 mg daily.  2.  Diltiazem 180 mg daily.  3.  Lisinopril 10 mg daily.  4.  Metoprolol 50 mg daily.  5.  Hydrochlorothiazide 12.5 mg daily.  6.  Zetia 10 mg daily.  7.  Protonix 40 mg daily.   ALLERGIES:  The patient has allergy to CODEINE.   SOCIAL HISTORY:  The patient quit smoking in 1998. He is no longer drinking  alcohol.   SOCIAL HISTORY:  The patient lives in a [place],  [facility] [place] area, is  married, lives with his wife. Works as a Education administrator.  She has three children.   FAMILY MEDICAL HISTORY:  The mother died at age 67 of pancreatic cancer.  Father died at age 36 of prostate cancer. The patient has several siblings  with stomach, lung, and colon cancer. History of alcoholism in the family as  well.   REVIEW OF SYSTEMS:  Notable for some recent nightsweats. The patient denies  headache. Does note shortness of breath. The patient denies headache. Does  note shortness of breath. Denies any abdominal complaints, nausea, vomiting.  No problems controlling the bowel or bladder. Numbness of the feet noted.   PHYSICAL EXAMINATION:  VITAL SIGNS: Blood pressure is currently 94/62, heart  rate 63, respiratory rate 20, temperature afebrile.  GENERAL: The patient is a markedly obese white male who is alert and  cooperative at this time.  HEENT: Head is atraumatic. Pupils equal, round, and reactive to light. Discs  are flat bilaterally.  NECK: Supple, no carotid bruits noted.  RESPIRATORY: Clear.  CARDIOVASCULAR: Regular rate and rhythm with no obvious  murmurs or rubs  noted.  EXTREMITIES: Without significant edema. Noted. Also 1+ edema noted in the  ankles bilaterally.  NEUROLOGIC: Cranial nerves as above. Facial symmetry is present. The patient  has good sensation to facial pinprick, soft touch bilaterally. He has good  strength in facial muscles and the muscles of head turning, shoulder shrug  bilaterally. Speech is well enunciated and not aphasic.  Motor testing 5/5  strength in both arms and left leg; cannot test the right leg due to recent  catheterization. The patient has good finger-nose-finger bilaterally and  good toe-to-finger on the left. Pinprick and soft touch sensation is normal  throughout with the exception of stocking glove, pinprick sensory deficit  noted just across the ankles bilaterally. Sensation is impaired to a  moderate degree in both feet as compared to the arms which are normal. No  drift is seen. Deep tendon reflexes are depressed, but symmetric. Toes are  neutral bilaterally. The patient is not ambulating.   Laboratory values notable for a white count of 10.9, hemoglobin 14.3,  hematocrit 41.1, MCV 91.6, platelet count 203,000.  Sodium 137, potassium  4.5, chloride 100, CO2 30, glucose 137, BUN 26, creatinine 1.2, calcium 9.5,  alkaline phosphatase 56, SGOT 18, SGPT 25, total protein 6.2, albumin 3.6,  calcium 8.8.  INR 1.0.   IMPRESSION:  1.  History of tremors, mild by exam.  2.  Fatigue.  3.  Obstructive sleep apnea.  4.  Possible depression.  5.  Obesity.  6.  Coronary artery disease.   The patient mainly seems to be complaining of apathy and fatigue. The  patient complains of some short windedness with activity as well. The  patient may need to be re-evaluated for obstructive sleep apnea to determine  if CPAP is effective at this point. Need to rule out other problems such as  B12 deficiency as it can result in peripheral neuropathy and fatigue as this patient seems to be complaining of.  The  patient is on Toprol which can also  cause some fatigue problems. Rule out cerebrovascular disease as well.   PLAN:  1.  MRI of the brain with and without gadolinium.  2.  Check bloodwork for B12 level, serum protein electrophoresis, sed rate.  3.  Will follow the patient's clinical course. Again a pulmonary evaluation      may be important.  [name], M.D.  Electronically Signed     CKW/MEDQ  D:  06/29/2005  T:  06/29/2005  Job:  [id number]

## 2011-02-27 NOTE — Consult Note (Signed)
NAME:  [name], [name] NO.:  [id number]   MEDICAL RECORD NO.:  [id number]          PATIENT TYPE:  INP   LOCATION:  3736                         FACILITY:  [facility]   PHYSICIAN:  [name], M.D.  DATE OF BIRTH:  1941/02/01   DATE OF CONSULTATION:  07/01/2005  DATE OF DISCHARGE:  07/01/2005                                   CONSULTATION   CHIEF COMPLAINT:  We were asked by Dr. Myrtis Ser to see Mr. Goodin for his  increasing fatigue and dyspnea on exertion.   BRIEF HISTORY:  William Hendrix is a pleasant 70 year old man with a history of  coronary artery disease, hypertension, hyperlipidemia, and obstructive sleep  apnea that was diagnosed around 2001. He was admitted on June 26, 2005  with chest pain. His review of systems revealed that he had also been  experiencing dyspnea on exertion with worsening fatigue that has effected  his ability to work. He has also had some mild orthopnea. These symptoms  prompted a full workup which has included a left heart catheterization that  showed no new critical coronary artery disease. He has also had a CT  pulmonary angiogram that had no evidence of pulmonary embolism and normal  parenchyma. Mr. Delo tells me that he is being compliant with his C-PAP  at 12 centimeter of water plus room air and states that it has improved his  day time symptoms significantly. He has not had any follow-up since he  started C-PAP several years ago.   Further review of systems obtained from the patient and from his wife have  revealed some subtle changes in his mood including a short temper and some  depressed affect. He has also had some decrease in his energy level.  Finally, he tells me that he has gained approximately 30 pounds over the  last several years. We are consulted to evaluate for probable pulmonary  contribution to his current symptoms.   PAST MEDICAL HISTORY:  1.  Coronary artery disease:  A left heart catheterization on  June 29, 2005 showed a less than 30% proximal LAD lesion. His old LAD stent was      intact. A 30% mid-RCA lesion and normal left ventricular function on      ventriculogram.  2.  Obstructive sleep apnea compliant with 12 centimeter of water plus room      air.  3.  Hypertension.  4.  Significant prior tobacco use with 90 to 120-pack-year total history.  5.  Obesity.  6.  Degenerative joint disease of the spine and knee.  7.  Gout.  8.  Melanoma, status post resections.  9.  Left cataract removal.  10. Knee surgery.   ALLERGIES:  CODEINE.   CURRENT MEDICATIONS:  1.  Aspirin 325 mg daily.  2.  Hydrochlorothiazide 12.5 mg daily.  3.  Prinivil 10 mg daily.  4.  Protonix 40 mg daily.  5.  Tiazac/Cardizem 180 mg daily.  6.  Toprol XL 50 mg daily.  7.  Zetia 10 mg daily.  8.  Ambien p.r.n.  9.  GI cocktail p.r.n.  10. Maalox p.r.n.  11. Milk of Magnesia p.r.n.  12. Tylenol p.r.n.  13. Vicodin p.r.n.  14. Zofran p.r.n.   SOCIAL HISTORY:  The patient lives with his wife in [place]. He works as a  Education administrator, has done so for many years. He is a former heavy tobacco user with  90 to 120 total pack years. He quit in 1997. 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   FAMILY HISTORY:  Significant for pancreatic, prostate, stomach, and lung  cancer.   PHYSICAL EXAMINATION:  VITAL SIGNS:  Temperature 98.8, blood pressure  115/70, heart rate 62, respiratory rate 18 at SPO2 99% on room air.  GENERAL:  This is a comfortable, obese, pleasant gentleman who is in acute  distress on room air. He is sitting up on the side of the bed.  HEENT:  Oropharynx is somewhat narrow but there is no erythema or evidence  of postnasal drip. His pupils are equal, round, and react to light.  NECK:  Large but supple with no lymphadenopathy or JVD.  HEART:  Regular rate and rhythm with normal S1 and S2. No murmur, rub, or  gallop.  LUNGS:  Clear to auscultation bilaterally.  Breath sounds are easily heard  and there are no wheezes or crackles.  ABDOMEN:  Obese, soft, nontender, and nondistended with positive bowel  sounds.  EXTREMITIES:  No clubbing, cyanosis, or edema.  NEUROLOGICAL:  The patient is alert and oriented x3. Cranial nerves are  grossly intact. He has no tremor on my exam. He moves all four extremities  without 5/5 strength. Sensation is intact and his reflexes were not tested.   LABORATORY DATA:  White blood cell count 12.3, hematocrit 43.2, platelets  216,000. Sodium 133, potassium 4.7, chloride 99, CO2 27, BUN 25, creatinine  1.1, glucose 148. Troponins on admission were negative. An ACE level is  pending. ANA is pending. ERS was 10. Rheumatoid factor was 99 which is  significantly elevated. TSH was 1.87. CT pulmonary angiogram is detailed  above. A MRI of the brain has been performed which is negative for any acute  stroke or intracranial process. An EEG has been performed which shows some  diffuse nonspecific slowing and no evidence of seizure focus.   IMPRESSION:  1.  Fatigue with apparent psychomotor symptoms that may be due to      depression:  Both fatigue and depressive symptoms can certainly be      exacerbated by under treated obstructive sleep apnea. He is at      significant risk for under treatment given his over 30 pound weight gain      since his C-PAP was initially prescribed. He will need repeat      polysomnography with C-PAP titration. I will arrange this for him as an      outpatient. I agree with treatment of his possible depression if we      believe this is a significant contributor to his symptoms.  2.  Dyspnea on exertion:  Contributors likely include chronic obstructive      pulmonary disease with his heavy tobacco history, although he has no      wheezing on exam. We must also consider possible restrictive disease     from his body habitus or also from possible interstitial disease. He has      an elevated  rheumatoid factor which appears to be a new finding. His CT      scan  of the chest has no parenchymal abnormality but it may be      appropriate to perform a high resolution CT scan of the chest as an      outpatient depending on the results of his pulmonary function testing. I      would recommend full pulmonary function tests as an outpatient with      follow-up by Dr. Shelle Iron or by myself. I will arrange for this. We will      perform ambulatory oximetry prior to his discharge to rule out occult      hypoxemia. We will arrange for further testing as an outpatient as      dictated by his      pulmonary function tests results including possible high resolution CT.      He has no current evidence of pulmonary hypertension and I will defer      any evaluation for this at this time unless he desaturates when he      ambulates.           ______________________________  [name], M.D.     RSB/MEDQ  D:  07/01/2005  T:  07/02/2005  Job:  [id number]   cc:   [name], M.D. [facility]  [address]  [place]  [place] [address]   [name], M.D.  [address]  [address]  [place]  [place] [address]   Marcelyn Bruins, M.D. [facility]  [address]  [place]  [place] [address]

## 2011-02-27 NOTE — Assessment & Plan Note (Signed)
[facility]                            CARDIOLOGY OFFICE NOTE   William, Hendrix                     MRN:          [id number]  DATE:09/22/2006                            DOB:          [birth year]    [name] is doing better.  Please see my last note of August 26, 2006.  I decided to push his diuretic further.  We put him on 40 mg of  Lasix twice a day and he has had a excellent response.  He has lost  three pounds of fluid weight.  His breathing is better and he is doing  well.  We discussed the timing of his second dose of Lasix and it can be  adjusted based on his activity.   For past medical history, other medical problems, allergies, and  medications, see the chart and the prior notes.   REVIEW OF SYSTEMS:  As described above.  He feels well and his review of  systems is negative.   PHYSICAL EXAMINATION:  VITAL SIGNS:  Weight today is down three pounds  to 265.  Blood pressure 105/69 with a pulse of 58.  GENERAL:  The patient's wife is present and we discussed all of the  findings.  He is oriented to person, time and place and his affect is  normal.  HEENT:  No xanthelasma.  He has normal extraocular motion.  NECK:  There are no carotid bruits.  There is no jugular venous  distension.  LUNGS:  Clear today.  Respiratory effort is not labored.  CARDIAC:  S1 and S2.  There are no clicks or significant murmurs.  ABDOMEN:  Obese but soft.  EXTREMITIES:  Today he has no peripheral edema.  MUSCULOSKELETAL:  There are no musculoskeletal deformities.   Problems are listed on my note of July 15, 2006.  His overall status  is now improved.  His volume status is better controlled on 40 mg of  Lasix b.i.d. and he will be kept on this and I will see him back in  three months.  We know that his potassium has been stable.     [name], MD, [name]  Electronically Signed    JDK/MedQ  DD: 09/22/2006  DT: 09/22/2006  Job #: [id number]

## 2011-02-27 NOTE — Assessment & Plan Note (Signed)
[facility]                              CARDIOLOGY OFFICE NOTE   NAME:William Hendrix, William Hendrix                     MRN:          [id number]  DATE:08/26/2006                            DOB:          1941-08-22    Please see my complete note of July 26, 2006.   When seen last, I decided to follow the patient.  We continued his Lasix at  40 mg daily.  He is doing well.  He is working.  He does have exertional  shortness of breath.   PAST MEDICAL HISTORY AND MEDICATIONS:  See the note of July 26, 2006.   REVIEW OF SYSTEMS:  He is really doing well other than some exertional  shortness of breath.   PHYSICAL EXAMINATION:  VITAL SIGNS:  His weight today is similar to what it  has been at 268.  Blood pressure is 122/70 with a pulse of 70.  The patient  is here with his wife.  The patient is oriented to person, time, and place,  and his affect is normal.  HEENT:  No xanthelasma in his eyes.  He has normal extraocular motion.  He  has no dental problems.  There is no jugular venous distention. There are no  carotid bruits.  LUNGS:  Clear.  Respiratory effort is not labored.  CARDIAC:  S1, S2.  There are no clicks or significant murmurs.  ABDOMEN:  Protuberant but soft.  EXTREMITIES:  The patient still has 1+ peripheral edema.   We will try to push his diuresis a little bit harder and then see him back  for early follow up to see how he does.  He will take an additional dose of  Lasix in the late afternoon, and he continues to be careful with salt and  fluid intake.     [name], MD, [facility]  Electronically Signed    JDK/MedQ  DD: 08/26/2006  DT: 08/26/2006  Job #: [id number]

## 2011-02-27 NOTE — Discharge Summary (Signed)
[place]  Patient:    William Hendrix, William Hendrix                     MRN: [id number] Adm. Date:  [id number] Disc. Date: 07/31/00 Attending:  Talitha Givens Dictator:   Delton See, P.A. CC:         Feliciana Rossetti, M.D.   Discharge Summary  DATE OF BIRTH:  1941/09/22  HISTORY OF PRESENT ILLNESS:  Mr. Keidel is a 70 year old male with a history of coronary artery disease, status post stent to the LAD in August 2000.  He has hypertension, hyperlipidemia.  He was previously on Lipitor.  This was discontinued secondary to myalgias.  He has obstructive sleep apnea.  The patient was seen in the emergency room for chest pain, which he stated was previous to his previous stent pain.  The patient was admitted for further evaluation.  PAST MEDICAL HISTORY:  Please see above.  The patient is also status post cervical disk surgery, status post back surgery.  He has a history of a hiatal hernia.  ALLERGIES:  CODEINE.  SOCIAL HISTORY:  The patient quit smoking in 1998.  He smoked two packs per day for 45 years.  He used to be a heavy drinker.  HOSPITAL COURSE:  As noted, this patient was admitted to [facility] on July 28, 2000, by Dr. Nathen May after presenting to the emergency room for an evaluation of chest pain.  He was scheduled for an adenosine Cardiolite that was performed on July 29, 2000.  This showed a focal area of ischemia in the apical anterior lateral wall, ejection fraction estimated to be 53%.  The patient underwent a cardiac catheterization on July 30, 2000, performed by Dr. Arturo Morton. Stuc[place].  There was no stent restenosis.  There was slow filling of the apical vessel with diffuse irregularities.  The left ventricle was felt to be normal with an ejection fraction of approximately 70%.  Some mild spasm was noted, and an irregular area in the RCA.  It was felt that there was a possibility that the  patients pain might be coming from the distal LAD, and it was recommended that Imdur be added.  The patient was started on Imdur 60 mg q.d.  DISPOSITION:  Arrangements were made to discharge the patient from the hospital in an improved condition.  His elevated cholesterol would be further evaluated in the office when seen in followup by Dr. [name].  LABORATORY DATA:  A CBC was within normal limits on July 30, 2000. Chemistries were unremarkable except for a sodium of 134.  Cardiac enzymes were negative x 3.  A lipid profile revealed a cholesterol of 180, triglycerides of 165, HDL 27, LDL 120.  TSH was 2.617.  A chest x-ray showed cardiomegaly with no active disease.  DISCHARGE MEDICATIONS: 1. Coated aspirin 325 mg q.d. 2. Lopressor 25 mg b.i.d. 3. Zestril 20 mg q.d. 4. Nitroglycerin p.r.n. chest pain. 5. Imdur 60 mg q.d.  INSTRUCTIONS:  The patient was told to avoid any strenuous activity or driving for at least two days.  He is told to call the office if he has any increased pain, swelling, or bleeding from his groin.  DIET:  He is to be on a low-salt, low-fat diet.  FOLLOWUP:  He is to see Dr. Myrtis Ser on August 27, 2000, at 11 a.m., to see Dr. Feliciana Rossetti as needed.  DISCHARGE DIAGNOSES:  1. Chest pain, myocardial infarction ruled out.  2. Adenosine Cardiolite performed on July 29, 2000, revealing an     ejection fraction of 53%, and focal area of ischemia in the anterior     apical lateral wall.  3. History of left anterior descending coronary artery stent in August 2000,     with no significant restenosis on catheterization this admission     performed on July 30, 2000.  4. Normal left ventricular function.  5. History of hyperlipidemia with intolerance to Lipitor.  6. Status post multiple surgeries.  7. History of hypertension.  8. Obstructive sleep apnea.  9. Remote alcohol and tobacco use. 10. History of a hiatal hernia. DD:  07/31/00 TD:   07/31/00 Job: [id number]

## 2011-02-27 NOTE — H&P (Signed)
NAME:  William Hendrix, PREVETTE NO.:  [id number]   MEDICAL RECORD NO.:  [id number]          PATIENT TYPE:  EMS   LOCATION:  MAJO                         FACILITY:  [facility]   PHYSICIAN:  [name], M.D.     DATE OF BIRTH:  1941-05-20   DATE OF ADMISSION:  06/26/2005  DATE OF DISCHARGE:                                HISTORY & PHYSICAL   CARDIOLOGIST:  Dr. Jerral Bonito.   PRIMARY CARE PHYSICIAN:  Dr. Debby Bud.   HISTORY OF PRESENT ILLNESS:  William Hendrix is a 70 year old Caucasian  gentleman with a known history of coronary artery disease, last cath in  2001, who presents today with complaints of intermittent chest pressure over  the last 2-3 weeks, very vague symptoms prior to this a.m.  However this  morning, at around 5:30, chest pressure woke [name] up.  He rated it a  10 initially.  He states he has not felt very well over the last 3 days, has  been very tired and short of breath at times.  Also he recently had an  epidural injection of steroids to his back last Friday for his chronic neck  pain and degenerative problems.  Today, the episode of substernal chest pain  under his left breast, he states it was pressure with tightness.  Over a  period of 30 minutes he took 3 nitroglycerin with decrease in discomfort to  a 2 to 3 after the third nitroglycerin.  His wife drove him up to the fire  department near their house.  Blood pressure was checked, initially 199/99.  Chest discomfort continued.  He received another nitroglycerin and  discomfort gradually eased off to a 1 to 2.  William Hendrix chest discomfort  has been associated with shortness of breath and diaphoresis.  Currently he  is having pain between a 2 and 3.  His allergies include CODEINE and  intolerance to STATINS.   CURRENT MEDICATIONS:  Aspirin 325 mg daily, Tiazac 180 mg daily, lisinopril  10 mg daily, Toprol XL 50 mg daily, Zetia 10 mg daily, HCTZ 12.5 mg daily,  and fish oil 1000 mg p.o. b.i.d.   PAST MEDICAL HISTORY:  Coronary artery disease.  [name] had a stent to  the LAD in 2000 and then a cardiac catheterization repeated in 2001  secondary to episode of chest discomfort.  There was no stent restenosis.  There was slow filling of the apical vessel with diffuse irregularities.  The left ventricle was felt to be normal, with an ejection fraction of  approximately 70%.  Some mild spasm was noted, and an irregular area in the  right coronary artery.  It was felt that there was a possibility that this  patient's pain might be coming from the distal LAD, and it was recommended  that Imdur be added.  The patient was started on Imdur 60 mg daily.  However, the patient states he has no recollection of ever being started on  Imdur, at least since he left the hospital in 2001.  Other medical problems  include hypertension, hyperlipidemia, obesity, severe obstructive sleep  apnea  requiring CPAP q.h.s., kidney stones, gout, history of skin cancer in  the form of melanoma per wife, hiatal hernia, cervical disk disease status  post neck surgery, and status post back surgery.  Adenosine Cardiolite in  May 2006 showed an EF of 51% with negative ischemia.   SOCIAL HISTORY:  The patient lives in [place] with his wife.  He is a Estate agent.  He has 3 adult children.  He has a 120-pack-per-year history of  smoking.  He quit in 1998.  Exercise limited secondary to back and neck  problems.  ETOH:  History of alcohol abuse.  He quit 15 year ago.  Denies  any drug or herbal medication use.   FAMILY HISTORY:  Predominantly medical problems in the form of cancer.  Mother deceased at age 41 from pancreatic cancer.  Father deceased at age 31  from prostate cancer.  He has multiple siblings who have all died from  stomach cancer, lung cancer, colon cancer, etc.   REVIEW OF SYSTEMS:  Positive for night sweats, chest pain, shortness of  breath, dyspnea on exertion, orthopnea, edema.  The patient  states he has  had bilateral ankle edema.  Wife states his ankle edema has been somewhat  pronounced at times over the last 2 or 3 weeks.  MS:  Positive for myalgia  and arthralgia and pain in back and neck.  All other systems negative per  patient.   PHYSICAL EXAMINATION:  VITAL SIGNS:  Temp 97.8, pulse is 56, respirations  are 16, blood pressure currently 104/54, sat 98% on 2 liters.  GENERALLY:  The patient is very pleasant and cooperative.  He is complaining  of some mild chest discomfort.  HEENT:  Pupils are equal, round and reactive to light, and sclerae are  clear.  NECK:  Supple, without lymphadenopathy.  Negative bruit, negative JVD.  HEART:  Regular rate and rhythm, S1 and S2.  Pulses are 2+ and equal.  LUNGS:  Clear to auscultation bilaterally, somewhat decreased breath sounds  in bilateral bases.  ABDOMEN:  Soft, nontender.  He has positive bowel sounds.  EXTREMITIES:  No clubbing or cyanosis.  He has 2+ BPs with a trace of edema.  NEURO:  He is alert and oriented x3.  Cranial nerves II-XII grossly intact.   ADMISSION LABORATORIES:  Chest x-ray shows minimal left base subseg  atelectasis.  EKG at a rate of 55, sinus brady.   Lab work showing hemoglobin 15 with hematocrit 44, potassium 4.3, BUN 33  with creatinine 1.2, glucose 126.  AST is 16, ALT is 28.  Troponin point of  care less than 0.05 x2.  BNP is less than 30.   ASSESSMENT AND PLAN:  The patient is being admitted with unstable angina.  Plans for cardiac catheterization.  Dr. Dionicio Stall has examined and  assessed the patient.   IMPRESSION:  1.  Coronary artery disease status post PCI.  2.  Unstable angina.  3.  Sleep apnea.  4.  Hypertension, very stable.  5.  Statin intolerance.   The patient will be admitted to telemetry.  Cycle cardiac enzymes, plan for  cardiac catheterization, proceed with heparin and nitroglycerin drip, check a fasting lipid panel, possibly check CT of the chest secondary to  findings  on chest x-ray.      William Pod, NP    ______________________________  [name], M.D.    MB/MEDQ  D:  06/26/2005  T:  06/26/2005  Job:  [id number]

## 2011-02-27 NOTE — Assessment & Plan Note (Signed)
[facility]                             PULMONARY OFFICE NOTE   NAME:[name], William Hendrix                     MRN:          [id number]  DATE:01/25/2007                            DOB:          Aug 12, 1941    SUBJECTIVE:  [name] is a 70 year old man with a history of  coronary artery disease followed by Dr. Myrtis Ser at [facility].  He  has also been followed by me for obstructive sleep apnea.  He reports  that he has been tolerating CPAP of 15 cm of water quite well since our  last visit one year ago.  He does not have any snoring or witnessed  apneas as long as he is wearing positive pressure.  He denies any  daytime sleepiness although he thinks that he has been getting more  fatigued since there have been efforts to more aggressively manage his  blood pressure.  He has also had some difficulty with pulmonary edema  and his Lasix has been adjusted.  He denies dyspnea except with  strenuous work including climbing ladders or walking over 200 feet.  He  tells me that his CPAP is in good repair.   MEDICATIONS:  1. Aspirin 325 mg daily.  2. Fish oil 1000 mg daily.  3. Protonix 40 mg daily.  4. Lisinopril 10 mg daily.  5. Ibuprofen 600 mg b.i.d.  6. Diltiazem 180 mg daily.  7. Atenolol 50 mg daily.  8. Lasix 40 mg b.i.d.  9. Imdur 30 mg daily.  10.Nitroglycerin p.r.n.  11.Ibuprofen p.r.n.   PHYSICAL EXAMINATION:  GENERAL:  This is a very pleasant, obese man in  no distress on room air.  VITAL SIGNS:  His weight is 268 pounds, which is up five pounds from our  last visit one year ago.  Temperature 97.9.  Blood pressure 110/68.  Heart rate 58.  SP02 of 95 percent on room air.  HEENT:  The oropharynx is moist without lesion.  He has no posterior  pharyngeal erythema.  NECK:  Supple without any lymphadenopathy or stridor.  LUNGS:  Clear to auscultation bilaterally without crackles or wheezes.  HEART:  Regular rhythm, he is borderline  bradycardic.  There are no  murmurs.  ABDOMEN:  Obese, soft, nontender with positive bowel sounds.  EXTREMITIES:  No cyanosis, clubbing, or edema.   IMPRESSION:  1. Obstructive sleep apnea that appears to be well controlled on 15 cm      of water CPAP.  2. History of tobacco use with normal spirometry and diffusion      capacity in October of 2006.  3. Coronary artery disease.  4. Hypertension.   PLAN:  I will continue [name] on CPAP of 15 cm of water.  He will  follow up with me in one year or sooner should he have any difficulties.  If he continues to have shortness of breath when he exerts himself, it  may be reasonable to repeat his spirometry at some point to insure that  he has not developing air flow limitation given his history of tobacco  use.  William Peer, MD  Electronically Signed    RSB/MedQ  DD: 01/25/2007  DT: 01/25/2007  Job #: [id number]   cc:   William Abed, MD, [facility]

## 2011-02-27 NOTE — Cardiovascular Report (Signed)
NAME:  William Hendrix, William Hendrix NO.:  [id number]   MEDICAL RECORD NO.:  [id number]          PATIENT TYPE:  INP   LOCATION:  2916                         FACILITY:  [facility]   PHYSICIAN:  Arturo Morton. Riley Kill, MD, FACCDATE OF BIRTH:  12-Jan-1941   DATE OF PROCEDURE:  07/02/2006  DATE OF DISCHARGE:                              CARDIAC CATHETERIZATION   INDICATIONS:  William Hendrix is a 70 year old gentleman who presents with  recurrent neck and chest discomfort.  The current study is done to assess  coronary anatomy.  The patient underwent stenting of the LAD in 2001, in  2006 the stent continued to remain patent.   PROCEDURE:  1. Left heart catheterization.  2. Selective coronary arteriography.  3. Selective left ventriculography.  4. Aortic root aortography.   DESCRIPTION OF PROCEDURE:  The patient was brought to the catheterization  laboratory, prepped and draped in usual fashion.  Through an anterior  puncture, the right femoral artery was easily entered and a 5-French sheath  was placed.  We then took views of the left and right coronary arteries.  Central aortic and left ventricular pressures were measured.  We elected not  to do a ventriculogram because of mild elevation in BUN. We did do a  proximal root aortogram because the patient had neck and shoulder pain as  part of his presentation, and blood pressure was elevated.  This was  completed with 25 mL of contrast.  He tolerated procedure without  complications and was taken to the holding area in satisfactory clinical  condition.   HEMODYNAMIC DATA:  1. Central aortic pressure 125/64, mean 90.  2. Left ventricular pressure 124/23.  3. No gradient pullback across aortic valve.   ANGIOGRAPHIC DATA:  1. Aortic root aortography demonstrates what appears to be a relatively      smooth aorta.  There does not appear to be significant aortic      regurgitation, no sign of double density in this single plane aortic  root angiogram is demonstrated.  2. The right coronary artery has some proximal calcification.  There is      some mild luminal irregularity of about 20-30% in the mid vessel but      this does not appear to be high grade or flow limiting.  There is minor      luminal irregularity at the origin of the PDA and the posterolateral      vessels are without significant focal obstruction.  3. The left main coronary artery is large caliber vessel that is free of      critical disease.  4. The left anterior descending artery demonstrates some tapered narrowing      of about 30-40% at the ostium.  This leads into a previously stented      area.  The stent, itself, has 30-40% and, perhaps at worst, 50%      narrowing proximally.  The vessel distal to this opens up and then the      LAD wraps the apex. There two diagonal branches.  The LAD, otherwise,      appears to be  free of critical disease. The diagonals do not appear      significantly compromised.  5. The circumflex is a large caliber vessel.  It provides a tiny first      marginal branch that has about 70-80% proximal narrowing, but is      insignificant.  The two remaining marginal branches are fairly large in      caliber and free of critical disease.   CONCLUSION:  1. No obvious evidence of aortic dissection.  2. Probable continued patency of the left anterior descending stent with      some mild narrowing in the proximal portion of the stent as noted      above.   DISPOSITION:  Based upon the above angiogram, my inclination would be to  recommend continued medical therapy.  I would be inclined to consider a  stress imaging study to rule out significant anterior ischemia.  If there  were be some present, then an intravascular ultrasound would be worthwhile  to reassess the LAD stent.  At the present time, it does not appear to be  significantly compromised, but the density of the stent makes intraluminal  visualization a little bit  more difficult than a standard stent.  Also,  comparing the study to the previous year, there does not appear to be high  grade change.  I will discuss the options with the patient and his family.      Arturo Morton. Riley Kill, MD, [facility]  Electronically Signed     TDS/MEDQ  D:  07/02/2006  T:  07/04/2006  Job:  [id number]   cc:   [name], MD, [facility]  [name], MD

## 2011-02-27 NOTE — Assessment & Plan Note (Signed)
[facility]                            CARDIOLOGY OFFICE NOTE   William Hendrix, William Hendrix                     MRN:          [id number]  DATE:02/02/2007                            DOB:          08/28/1941    William Hendrix is back for Cardiology followup.  See my note of December 28, 2006.  At that time, we did add a low dose of Imdur.  In fact, he is  feeling better.  We also stopped his Zetia.  I believe he does get  symptoms from the Zetia.   At this time, he is doing well.   PAST MEDICAL HISTORY:   ALLERGIES:  CODEINE.   MEDICATIONS:  1. Aspirin.  2. Fish oil.  3. Protonix.  4. Lisinopril 10 (to be changed to 5).  5. Diltiazem 180.  6. Atenolol 50.  7. Lasix 80 daily.  8. Imdur 30.   OTHER MEDICAL PROBLEMS:  See the extensive list on my note of December 28, 2006.   REVIEW OF SYSTEMS:  He is doing much better, and has no major  complaints.  He does mention that he had some dizziness today, and  noticed that his blood pressure was in the range of 110/50 while at [facility].  Otherwise, his review of systems is negative.   PHYSICAL EXAMINATION:  Blood pressure is 120/59 with a pulse of 74.  The patient is oriented to person, time, and place.  Affect is normal.  HEENT:  Reveals no xanthelasma.  He has normal extraocular motion.  He has no carotid bruits.  There is no jugular venous distention.  LUNGS:  Clear.  Respiratory effort is not labored.  CARDIAC EXAM:  Reveals an S1 with an S2.  He has no clicks or  significant murmurs.  He has no significant peripheral edema today.  He has normal bowel sounds.   Problems are listed on my note of December 28, 2006.  Number 8. Coronary artery disease.  He seems to feel better with Imdur,  and this low dose will be continued, and he is not having significant  headache.  Number 11. Hypercholesterolemia, on Zetia.  He had CPK elevation from  Lipitor.  We stopped his Zetia, and he feels better.  We will keep  him  off all medications.  He is on fish oil.  We will reconsider at the next  visit.  Number 8.  Volume overload.  He has changed to taking his Lasix as 80 mg  daily, and this helps with his overall status.  New problem:  Mild hypotension.  We will cut his Lisinopril dose from 10  to 5 mg daily, and not make any other changes today, and then I will see  him back in 6 weeks.     [name], MD, [facility]  Electronically Signed    JDK/MedQ  DD: 02/02/2007  DT: 02/02/2007  Job #: [id number]

## 2011-02-27 NOTE — Discharge Summary (Signed)
NAME:  William Hendrix, HAUGEN NO.:  [id number]   MEDICAL RECORD NO.:  [id number]          PATIENT TYPE:  INP   LOCATION:  3736                         FACILITY:  [facility]   PHYSICIAN:  Vida Roller, M.D.   DATE OF BIRTH:  08/23/1941   DATE OF ADMISSION:  06/26/2005  DATE OF DISCHARGE:  07/01/2005                                 DISCHARGE SUMMARY   ADDENDUM:  The patient has been scheduled for an outpatient pulmonary  function test with [facility] on Thursday, July 23, 2005, follow  up with Dr. Delton Coombes or Dr. Shelle Iron.      Dorian Pod, NP      Vida Roller, M.D.  Electronically Signed    MB/MEDQ  D:  07/02/2005  T:  07/03/2005  Job:  [id number]   cc:   [name], M.D. [facility]  [address]  [place]  [place] [address]   [name], M.D.   [name], M.D.  [address]  [address]  [place]  [place] [address]   C. Lesia Sago, M.D.  Fax: [phone]

## 2011-02-27 NOTE — Procedures (Signed)
NAME:  William Hendrix, SWINDLER NO.:  [id number]   MEDICAL RECORD NO.:  [id number]          PATIENT TYPE:  OUT   LOCATION:  SLEEP CENTER                 [facility]ACILITY[facility]  [facility]   PHYSICIAN:  Marcelyn Bruins, M.D. [facility] DATE OF BIRTH:  14-Jan-1941   DATE OF STUDY:  07/21/2005                              NOCTURNAL POLYSOMNOGRAM   REFERRING PHYSICIAN:  Dr. Levy Pupa.   DATE OF STUDY:  July 21, 2005.   INDICATION FOR STUDY:  Hypersomnia with sleep apnea. The patient was  diagnosed with moderate obstructive sleep apnea in 2000 and now is having  increasing symptoms. He returns for titration.   EPWORTH SCORE:  Seven.   SLEEP ARCHITECTURE:  The patient had total sleep time of 381 minutes with  decreased slow wave sleep but adequate REM. Sleep onset latency was normal  as was REM onset. Sleep efficiency was 84%.   RESPIRATORY DATA:  The patient was order to have a C-PAP titration study.  Titration was done with the patient's nasal pillows and atoms circuits from  home. He was initiated on C-PAP of 12 cm due to his demands, and ultimately  he was increased to a final pressure 15 cm due to breakthrough snoring. He  had excellent tolerance of this pressure with no evidence for leaking from  his oral airway or his nasal pillows.   OXYGEN DATA:  Lowest O2 saturation during the titration was 90%.   CARDIAC DATA:  Rare PVC.   MOVEMENT/PARASOMNIA:  The patient was found to have 155 leg jerks with 7 per  hour resulting in sleep disruption. Clinical correlation is suggested after  appropriate treatment of the patient's obstructive sleep apnea.   IMPRESSION/RECOMMENDATIONS:  1.  Good control of previously diagnosed obstructive sleep apnea and snoring      with a C-PAP pressure of 15 cm.  2.  Large numbers of leg jerks with significant sleep disruption. These      continued despite optimal C-PAP pressure. I would recommend at this time      optimizing the patient's C-PAP pressure and  considering treatment of his      leg      jerks if he continues to be symptomatic after optimal C-PAP or if he is      having symptoms of the restless leg syndrome currently.           ______________________________  Marcelyn Bruins, M.D. [facility]  Diplomate, American Board of Sleep  Medicine     KC/MEDQ  D:  07/30/2005 15:05:57  T:  07/30/2005 22:33:53  Job:  [id number]

## 2011-02-27 NOTE — Assessment & Plan Note (Signed)
[facility]                              CARDIOLOGY OFFICE NOTE   BRAETON, WOLGAMOTT                     MRN:          [id number]  DATE:07/15/2006                            DOB:          [birth year]    [name] is seen back post hospitalization.  He had been admitted for  chest pain.  He was catheterized.  He has a stent to his LAD from the past  that was patent.  Dr. Rosalyn Charters careful evaluation led to the conclusions  that he did not need an intervention.  It was felt that a follow-up Myoview  scan would be helpful to be sure that there was no sign of ischemia in the  LAD distribution.  This study has been done.  The nuclear scan was done on  July 12, 2006.  He has good LV motion.  There was no sign of ischemia.   The patient returns today and he still has some intermittent chest pain.  His bigger complaint is shortness of breath with exertion.  He is  significantly overweight, and he knows this.  At times he may have some  chest tightness when he becomes short of breath.  He also has leg fatigue  when walking.  He has not had syncope or presyncope.   PAST MEDICAL HISTORY:  Allergies:  CODEINE.   MEDICATIONS:  1. Aspirin 325 mg.  2. Hydrochlorothiazide.  3. Tiazac 180 mg.  4. Zetia 10 mg.  5. Fish oil.  6. Toprol.  7. Protonix.  8. Lisinopril 10 mg.   Other medical problems:  See the list below.   REVIEW OF SYSTEMS:  He is not having any GI or GU complaints.  He has leg  fatigue when walking.  We will decide in the future if follow-up Dopplers  are indicated.  Otherwise, his review of systems is negative other than the  HPI.   PHYSICAL EXAMINATION:  VITAL SIGNS:  Blood pressure is 112/74.  The pulse is  56.  The weight is 264 pounds.  Rhythm is regular.  GENERAL:  He is well-nourished.  In fact, he is significantly overweight.  He is stable sitting in the room today.  He is oriented to person, time and  place.  Affect gives  the impression that the patient may be mildly  depressed.  HEENT:  No xanthelasma.  He has normal extraocular motion.  NECK:  There is no jugular venous distention.  There are no carotid bruits.  BACK:  He has no kyphosis or scoliosis.  LUNGS:  Clear.  Respiratory effort is not labored.  CARDIAC:  An S1 with an S2.  There are no clicks or significant murmurs.  ABDOMEN:  Obese but soft.  There are no obvious masses or bruits.  There are  no bruits heard in the abdomen.  SKIN:  There is a question of slight pitting of the skin in his lower back.  EXTREMITIES:  There is 1+ peripheral edema.  There are 1+ distal pulses.  MUSCULOSKELETAL:  There are no major musculoskeletal deformities.   The patient had a Myoview  scan on July 12, 2006, with no sign of ischemia.   PROBLEM LIST:  1. History of nasal surgery in the past.  2. Significant back problems in the past.  3. Sinusitis history.  4. Hypertension, controlled.  5. History of hiatal hernia.  6. Sleep apnea.  The patient is on CPAP.  7. Coronary disease with a stent in 2000, catheterization in the fall of      2006, and most recent catheterization in September 2007, with a follow-      up Myoview showing no ischemia.  8. History of nitrate intolerance with headaches.  9. Hypercholesterolemia, on Zetia.  He elevated his CPK on Lipitor.  He      takes fish oil.  We will reassess and consider whether or not we will      test him with other statins.  10.Normal left ventricular function.  11.History of fatigue and depression.  12.Obesity.  He knows that he needs to lose weight.  13.History of renal stones.  14.History of gout.  15.History of skin cancer of some type in the past with question of      melanoma.  16.Some leg fatigue.  We will decide in the future if Dopplers are      appropriate.  17.Question of total body volume overload.  There is question of some      pitting in his lower back.  He does have some peripheral edema.   He      does not watch his salt intake, and he may be drinking some extra fluid      for him.  On this basis, I have asked him to be more careful with salt      and to control his volume intake, and I have changed his      hydrochlorothiazide to 40 mg of Lasix.  I will see him back in      approximately 10 days with a BMET a day or two before to see how he is      doing.  Over time we will also consider a cardiopulmonary exercise test      to learn more about his exertional shortness of breath.            ______________________________  [name], MD, [facility]     JDK/MedQ  DD:  07/15/2006  DT:  07/16/2006  Job #:  [id number]

## 2011-02-27 NOTE — Assessment & Plan Note (Signed)
[facility]                            CARDIOLOGY OFFICE NOTE   [name], [name]                     MRN:          [id number]  DATE:12/28/2006                            DOB:          May 02, 1941    William Hendrix is here for cardiology followup. He has had some chest  discomfort. His last procedure was done in September 2007. At that time  a catheterization was carefully done, Dr. Riley Kill carefully evaluated it  and he did not need an intervention. We did a nuclear scan to be sure  there was no ischemia in the LAD distribution and this showed no  ischemia. He had responded nicely to diuresis. It has been hard for him  to take his second dose of Lasix when he gets home from work. His weight  is up and there may be a volume component to his symptoms. Also he has  mild diffuse aching. He had CPK elevation from a statin and this was  stopped. He is on Zetia and will have to hold his Zetia.   PAST MEDICAL HISTORY:  CODEINE.   MEDICATIONS:  Aspirin, Zetia, fish oil, Protonix, lisinopril, ibuprofen,  diltiazem, atenolol and Lasix 40 b.i.d. (to be changed to 80 mg daily)   OTHER MEDICAL PROBLEMS:  See the list below.   REVIEW OF SYSTEMS:  I mentioned that he is having some leg discomfort  and otherwise his review of systems is negative.   PHYSICAL EXAMINATION:  VITAL SIGNS:  Weight is 272 pounds, blood  pressure 128/73 with a pulse of 54.  GENERAL:  The patient is oriented to person, time and place. Affect is  normal.  HEENT:  There is no xanthelasma. He has normal extraocular motion.  NECK:  There is no carotid bruits. There is no jugular venous  distention.  LUNGS:  Clear. Respiratory effect is not labored.  ABDOMEN:  Protuberant.  CARDIAC:  Reveals an S1 with an S2 but no clicks or significant murmurs.  He has trace peripheral edema.  MUSCULOSKELETAL:  No deformities.   EKG reveals sinus bradycardia. He has some nonspecific ST/T wave  changes  that are not significantly changed.   PROBLEM LIST:  1. History of nasal surgery.  2. Significant back problems.  3. History of sinusitis.  4. Hypertension treated.  5. Hiatal hernia.  6. Ventral hernia.  7. Sleep apnea on CPAP.  8. Coronary disease with a stent in 2000.  9. Catheterization in the fall of 2006 and most recently in September      2007 with a followup Myoview showing no ischemia.  10.History of nitrate intolerance with headaches.  11.Hypercholesterolemia on Zetia. He has a CPK elevation on Lipitor.      He takes fish oil. Because of his aches and pains will have to hold      his Zetia and then see him back.  12.Good LV function.  13.History of fatigue and depression.  14.Obesity and he knows he needs to lose weight and we talked about      this.  15.History of renal stones.  16.History of gout.  17.History of some type of skin cancer in the past with question of      melanoma.  18.Volume overload. He may hide a fair amount of fluid in various      tissues. He will start taking his Lasix 80 in the morning to be      sure he does not forget to take his dose later in the day.   Currently he is having some chest pain. I am not inclined to do either a  nuclear scan or a cath at this point. Will try to diurese him further.  Will hold his Zetia because of his diffuse aches and pains. I had  considered adding Imdur although his problem list now shows that he had  headaches from Imdur. I will talk to him about possible retrial of a low-  dose of Imdur.   Dictation ended at this point.     [name], MD, [facility]  Electronically Signed    JDK/MedQ  DD: 12/28/2006  DT: 12/29/2006  Job #: [id number]

## 2011-02-27 NOTE — Discharge Summary (Signed)
NAME:  MARCELL, CHAVARIN NO.:  [id number]   MEDICAL RECORD NO.:  [id number]          PATIENT TYPE:  INP   LOCATION:  3709                         FACILITY:  [facility]   PHYSICIAN:  Doylene Canning. Ladona Ridgel, MD    DATE OF BIRTH:  [birth year]   DATE OF ADMISSION:  07/01/2006  DATE OF DISCHARGE:  07/03/2006                                 DISCHARGE SUMMARY   PRIMARY CARDIOLOGIST:  Is Dr. [name].   PRIMARY CARE PHYSICIAN:  Dr. [name].   PRINCIPAL DIAGNOSIS:  Chest pain.   SECONDARY DIAGNOSES:  1. Coronary artery disease, status post stenting of the left anterior      descending in 2000 with normal left ventricular function at that time.  2. Hyperlipidemia.  3. Hypertension.  4. Hiatal hernia.  5. Sleep apnea.  6. History of spinal surgery.  7. Depression.  8. Nephrolithiasis.  9. Gout.  10.History of skin cancer.  11.History of intolerance to nitrates secondary to headaches.  12.History of nasal surgery.  13.History of sinusitis.   ALLERGIES:  CODEINE AND LIPITOR.   PROCEDURE:  Left heart cardiac catheterization.   HISTORY OF PRESENT ILLNESS:  A 70 year old male with prior history of CAD,  status post PCI of the LAD in 2000 with subsequent catheterization in 2006,  revealing patent stent.  He was in his usual state of health until July 01, 2006, when while at work he developed significant shortness of breath  lasting over one hour, associated with chest tightness.  This prompted him  to present to the [facility] ED where EKG showed nonspecific ST/T-wave  changes and he was admitted for further evaluation.   HOSPITAL COURSE:  Mr. Biebel ruled out for MI by cardiac markers x2.  He  underwent left heart cardiac catheterization on July 02, 2006 revealing  30% to 50% stenosis within the previously placed proximal LAD stent.  He,  otherwise, had nonobstructive coronary disease.  Decision was made to  continue medical therapy and he has had not any  recurrent chest pain.  He  will follow up with Dr. Myrtis Ser in approximately 2 weeks and will be discharged  home today in satisfactory condition.   DISCHARGE LABS:  Hemoglobin 13.4, hematocrit 37.8, WBC 6.1, platelets 173,  sodium 139, potassium 3.6, chloride 103, CO2 28, BUN 22, creatinine 1.2,  glucose 153, calcium 9.4.  CK 116, MB 2.0, troponin I 1.7.  Total  cholesterol 162, triglycerides 206, HDL 29, LDL 92.  TSH 2.324.   DISPOSITION:  The patient is being discharged home today in good condition.   FOLLOWUP PLANS AND APPOINTMENT:  He will be contacted by our office for  follow up with Dr. Myrtis Ser in approximately 2 weeks.  He is asked to follow up  with Dr. Debby Bud as previously scheduled.   DISCHARGE MEDICATIONS:  1. Aspirin 325 mg daily.  2. HCTZ 12.5 mg daily.  3. Toprol-XL 50 mg daily.  4. Zetia 10 mg daily.  5. Lisinopril 10 mg daily.  6. Protonix 40 mg daily.  7. Fish oil 1000 mg b.i.d.  8. Cardizem 180 mg  daily.  9. Nitroglycerin 0.4 mg sublingual p.r.n. chest pain.   OUTSTANDING LAB STUDIES:  None.   DURATION DISCHARGE ENCOUNTER:  Forty minutes, including physician time.     ______________________________  Nicolasa Ducking, ANP    ______________________________  Doylene Canning. Ladona Ridgel, MD    CB/MEDQ  D:  07/03/2006  T:  07/03/2006  Job:  [id number]   cc:   [name], MD

## 2011-02-27 NOTE — Discharge Summary (Signed)
NAME:  William Hendrix, BIR NO.:  [id number]   MEDICAL RECORD NO.:  [id number]          PATIENT TYPE:  INP   LOCATION:  3736                         FACILITY:  [facility]   PHYSICIAN:  [name], M.D.     DATE OF BIRTH:  27-Apr-1941   DATE OF ADMISSION:  06/26/2005  DATE OF DISCHARGE:  07/01/2005                                 DISCHARGE SUMMARY   PRIMARY CARDIOLOGIST:  [name], M.D.   PRIMARY CARE PHYSICIAN:  [name], M.D. [facility].   PULMONOLOGY:  [name], M.D.   NEUROLOGIST:  [name], M.D.   DISCHARGING DIAGNOSES:  1.  Chest pain, status post cardiac catheterization on June 29, 2005,      by Dr. Charlies Constable. Estimated ejection fraction of 60%. Coronary artery      disease, status post prior stenting of the left anterior descending with      nonobstructive coronary artery disease with less than 30% narrowing at      the stent site in the proximal left anterior descending, irregularities      in the distal left anterior descending and circumflex artery, 30%      narrowing in the mid right coronary artery, and normal left ventricular      function.  2.  Increased fatigue.  3.  Increased dyspnea.   PAST MEDICAL HISTORY:  1.  Coronary artery disease, status post stent to the LAD in 2000; repeat      cardiac catheterization in 2001 secondary to episode of chest      discomfort. There were some mild spasms noted and an irregular area in      the right coronary artery. It was felt that the patient's pain possibly      could be coming from the distal LAD.  2.  Hypertension.  3.  Hyperlipidemia.  4.  Obesity.  5.  Severe obstructive sleep apnea requiring CPAP.  6.  Kidney stones.  7.  Gout.  8.  History of skin cancer in the form of melanoma per wife.  9.  Hiatal hernia.  10. Cervical disk disease.  11. Status post neck surgery.  12. Status post back surgery.  13. Last stress test was an adenosine Cardiolite in May 2006 which showed  an      EF of 51%, with no ischemia.  14. Hypertension.  15. Statin intolerance.   PROCEDURES THIS ADMISSION:  1.  Cardiac catheterization on June 29, 2005.  2.  CT of the chest on September 19th. Negative for acute pulmonary      embolism. Diffuse fatty infiltration of liver.  3.  MRI of the brain on September 18th showing minimal nonspecific white      matter-type changes. No infarct or abnormal intracranial enhancing      lesion.   CONSULTATIONS THIS ADMISSION:  1.  On June 29, 2005, by Dr. Lesia Sago, secondary to tremors in his      arms and short temper, and numbness in his feet. The patient mainly      seemed to be complaining of apathy and fatigue.  The patient also      complains of short-windedness with activity. The patient may need to be      re-evaluated for obstructive sleep apnea to determine if the CPAP is      effective. He felt we needed to rule out other problems such as B12      deficiency as it carries peripheral neuropathy and fatigue as this      patient seems to be complaining of. The patient is also on Toprol which      can also cause some fatigue problems. Rule out cerebrovascular disease      as well. MRI of the brain results as stated above, within normal limits.      No further neurologic workup.  2.  Consultation with Dr. Delton Coombes on July 01, 2005, for increasing      fatigue and dyspnea on exertion. His impression was fatigue with      apparent psychomotor symptoms that may be due to depression, which both      can certainly be exacerbated by undertreated obstructive sleep apnea.      The patient has had 30-pound weight gain since his CPAP was initially      prescribed. He will need repeat polysomnography with CPAP titration. Dr.      Delton Coombes states he will arrange this for him as an outpatient.  3.  Dyspnea on exertion. Contributors likely include chronic obstructive      pulmonary disease with his heavy tobacco history, although he has no       wheezing on exam. Also possibly consider restrictive disease from his      body habitus from possible interstitial disease.  4.  CT scan of the chest without parenchymal abnormalities, but it would be      appropriate to perform a high resolution CT scan of his chest as an      outpatient depending on the results of his pulmonary function tests. He      recommended a full pulmonary function test as an outpatient with follow-      up by Dr. Shelle Iron or Dr. Delton Coombes, and Dr. Delton Coombes states he will arrange for      this.   HOSPITAL COURSE:  Mr. [name] initially admitted with complaints of chest  discomfort, increased dyspnea over a three-day period. The patient states he  took nitroglycerin with some decrease in his chest discomfort. However,  blood pressure had been running higher than normal at 199/99. The patient  presented to [facility] emergency room for further evaluation. He was  admitted to telemetry, cardiac enzymes were cycled. It was decided to re-  evaluate the patient's stent. He was treated with heparin and nitroglycerin.  Baseline bloodwork obtained. CT of the chest was also obtained and results  as stated above. The patient went to the cath lab on June 29, 2005,  with results as stated above. The patient tolerated the procedure without  complications. However, continued to complain of dyspnea and decreased  energy level. Consultations with urology and pulmonology obtained. No  further inpatient testing recommended. The patient is to followed up  outpatient.   Labwork prior to discharge revealed total protein 7.0, ANA negative. CBC  revealed white blood cell count of 12.3, hemoglobin 15, hematocrit 43.2,  with a platelet count 216,000. Sed rate 10.  RA 99, normal range 0-20.  Vitamin B12 was 442. Chemistry revealed sodium of 133, potassium 4.7, glucose 148, BUN 25, creatinine 1.1. D-dimer 1.19. However, CT  negative for  PE.  Portable chest x-ray on September 15th showed  mild left base  subsegmental atelectasis   DISPOSITION:  Patient being discharged home with follow up post cardiac  catheterization appointment on Wednesday, September 27th at 8:45 with  Wende Bushy, [facility]. Dr. Myrtis Ser will also be in the office that day. The  patient is to call and arrange for follow-up appointment with Dr. Debby Bud in  two to three weeks. I will call Dr. Kavin Leech office and remind them that the  patient  needs follow-up evaluation at their office. However, according to  Dr. Kavin Leech note he was going to arrange for that. The patient is also being  given discharge instructions post cardiac catheterization, no lifting over  10 pounds times one week, no driving for two days, no tub bathing times two  days. He is to call our office for any problems from his cath site.   FOLLOWUP MEDICATIONS:  A new prescription for Protonix 40 mg one p.o. daily.  He is to continue his previous medications including aspirin 325 mg, Tiazac  180 mg, lisinopril 10 mg, Toprol-XL 50 mg, Zetia 10 mg, HCTZ 12.5 mg, and  fish oil 1000 mg two capsules daily or as previously taken.      Dorian Pod, NP    ______________________________  [name], M.D.    MB/MEDQ  D:  07/02/2005  T:  07/02/2005  Job:  [id number]   cc:   [name], M.D.  Fax: [phone]   [name], M.D.   Rosalyn Gess Norins, M.D. [facility]  [address]  [place]  [place] [address]   [name], M.D.  [address]  [address]  [place]  [place] [address]

## 2011-02-27 NOTE — H&P (Signed)
NAME:  William Hendrix NO.:  [id number]   MEDICAL RECORD NO.:  [id number]          PATIENT TYPE:  INP   LOCATION:  3709                         FACILITY:  [facility]   PHYSICIAN:  Jonelle Sidle, MD DATE OF BIRTH:  [birth year]   DATE OF ADMISSION:  07/01/2006  DATE OF DISCHARGE:                                HISTORY & PHYSICAL   PRIMARY CARDIOLOGIST:  [name], MD.   PRIMARY CARE PHYSICIAN:  [name], MD.   CHIEF COMPLAINT:  Chest pain and shortness of breath.   HISTORY OF PRESENT ILLNESS:  [name] is a very pleasant 70 year old  male patient, followed by Dr. Myrtis Ser with a history of coronary disease;  status post stenting to the LAD in 2000.  He underwent catheterization  September 2006; revealed a patent stent in the proximal LAD and a 30%  stenosis in the mid RCA and good LV function.  The patient had been in his  usual state of health until this past week, when he started to notice left  arm discomfort with exertion.  Today, while at work, he developed  significant shortness of breath that lasted over an hour.  He also noted  chest tightness.  His chest tightness started around 9 o'clock and is still  ongoing.  He has noticed some diaphoresis.  He denies any syncope,  presyncope.  Denies any palpitations.  Denies any pleuritic chest pain.  He  continues to notice left arm discomfort.   PAST MEDICAL HISTORY:  As noted above, is significant for:  1. Coronary disease, status post stenting to the LAD in 2000.      Catheterization September 2006 showed less than 30% stenosis at the      proximal LAD stent, and mid RCA stenosis with 30% luminal      irregularities in the circumflex.  Good LV function with an EF of 60%.  2. Hypercholesterolemia.  3. Hypertension.  4. Hiatal hernia.  5. Sleep apnea.  6. Spine surgery.  7. Depression.  8. Nephrolithiasis.  9. Gout.  10.Skin cancer.  11.History of intolerance to nitrates secondary to  headaches in the past.  12.History of nasal surgery.  13.History of sinusitis.   CURRENT MEDICATIONS:  1. Aspirin 325 mg daily.  2. Hydrochlorothiazide 12.5 mg daily.  3. Tiazac 180 mg daily.  4. Zetia 10 mg daily.  5. Fish oil 1 gram daily.  6. Toprol 50 mg daily.  7. Protonix 40 mg daily.  8. Synthroid 10 mg daily.  9. Nitroglycerin p.r.n.   ALLERGIES:  CODEINE.   SOCIAL HISTORY:  The patient denies any current tobacco abuse.  He is an ex-  smoker.  Denies any alcohol abuse.  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   FAMILY HISTORY:  Significant for coronary disease.   REVIEW OF SYSTEMS:  Please see HPI.  Denies any fever, chills, cough,  melena, hematochezia, hematuria or dysuria; dysphagia or odynophagia.  The  rest of the review of systems are negative.   PHYSICAL EXAMINATION:  GENERAL:  He is a well nourished, well developed male  in no distress.  VITAL SIGNS:  Blood pressure 120/68, pulse 63, weight 270 pounds.  HEENT:  Head normocephalic and atraumatic.  Eyes:  PERRLA, EOMI.  Sclerae  are clear.  NECK:  Without lymphadenopathy and without thyromegaly.  Carotids without  bruits bilaterally.  CARDIAC:  Normal S1, S2.  Regular rate and rhythm without murmurs, clicks,  rubs or gallops.  LUNGS:  Clear to auscultation bilaterally; without wheezes, rales or  rhonchi.  ABDOMEN:  Soft and nontender.  There are no active bowel sounds.  No  organomegaly.  EXTREMITIES:  With trace 1+ edema bilaterally.  Calves are soft and  nontender.  VASCULAR EXAMINATION:  Femoral artery pulses are diminished, but without  bruits bilaterally.  Dorsalis pedis and posterior tibial pulses 2+  bilaterally.  SKIN:  Warm and dry.  NEUROLOGIC:  He is alert and oriented x3.  Cranial nerves II-XII are grossly  intact.   EKG:  Reveals sinus rhythm, with a heart rate of 63.  Left axis deviation.  Nonspecific ST-T wave changes.  Nonacute.   IMPRESSION:  1. Chest pain, concerning for unstable  angina pectoris.  2. Coronary artery disease.      a.     Status post stenting to the left anterior descending artery in       2000.      b.     Catheterization September 2006, revealing:  patent stent in the       left anterior descending artery and 30% stenosis in the mid right       coronary artery.      c.     Good left ventricular function.  3. Hyperlipidemia.  4. Hypertension.  5. History of hiatal hernia.  6. Sleep apnea.  7. History of spine surgery.  8. History of depression.  9. History of nephrolithiasis.  10.History of gout.  11.History of skin cancer.   PLAN:  The patient was also examined by Jonelle Sidle, MD.  We plan to  admit him to [facility].  Will treat him with aspirin, heparin and  nitroglycerin.  Hopefully he will be able to tolerate the nitrates  and not have any significant headaches.  Will also treat him with morphine  for pain.  Will continue all of his other home medications, which include  Toprol.  Will check serial cardiac markers and likely plan cardiac  catheterization tomorrow pending review by Dr. Myrtis Ser.     ______________________________  Tereso Newcomer, PA-C      Jonelle Sidle, MD  Electronically Signed    SW/MEDQ  D:  07/01/2006  T:  07/03/2006  Job:  [id number]   cc:   [name], MD, [facility]  [name], MD

## 2011-02-27 NOTE — Assessment & Plan Note (Signed)
[facility]                              CARDIOLOGY OFFICE NOTE   DELORES, THELEN                     MRN:          [id number]  DATE:07/26/2006                            DOB:          1941/01/06    Mr. Sivley returns after the visit of July 15, 2006.  I had added Lasix  at that time.  He clearly had a clinical response in terms of peeing all  the time.  His weight has not changed.  He did travel this past weekend to  a family reunion.  With this kind of travel, he may have had more  difficulties with fluid and salt issues, and sitting for long periods of  time.  His wife does think that his shortness of breath is somewhat better.  He appears less puffy.  He is not having any chest pain.  He has had no  syncope or pre-syncope.  We did check his BMET.  His creatinine is in the  1.4 range and it could be followed at that level, and his BUN is 35.   PAST MEDICAL HISTORY:   ALLERGIES:  CODEINE.   MEDICATIONS:  1. Aspirin 325 (we can lower this in the future).  2. Tiazac 180.  3. Zetia 10.  4. Fish oil.  5. Toprol 50.  6. Protonix.  7. Lisinopril 10.  8. Lasix 40.   OTHER MEDICAL PROBLEMS:  See the complete list on my note of July 15, 2006.   REVIEW OF SYSTEMS:  He is stable.  Other than his shortness of breath, he  has no significant other ongoing problems.  His review of systems,  otherwise, is negative.   PHYSICAL EXAM:  The patient is over-nourished in that his weight is high.  He is oriented to person, time, and place.  His affect reveals that he  always appears slightly depressed.  His wife is present at this time.  HEENT:  No xanthelasma.  He has normal extraocular motion.  There are no carotid bruits.  There is no jugular venous distension.  LUNGS:  Clear.  Respiratory effort is not labored.  On the previous visit I thought he might have a slight upper body edema.  This does not appear to be present today.  CARDIAC:   S1 with an S2.  There are no clicks or significant murmurs.  ABDOMEN:  Obese, but soft.  There are no masses, murmurs, or bruits.  Today, he has only trace peripheral edema.  Blood pressure is 102/60 with a pulse of 60, and his weight is 268 pounds,  which is the same as his weight at the time of the last visit.   Problems are listed extensively on the note of July 15, 2006.  1. Total body volume overload.  I do believe he has been and still is      slightly overloaded.  We talked carefully about salt and fluid intake.      He will continue on 40 of Lasix daily and I will see him back in      several weeks.  At  that time, we will consider cardiopulmonary exercise      testing because of the points made in the past.  2. Shortness of breath.  This fits in with the approach to the volume      status that I have been discussing.            ______________________________  [name], MD, [facility]     JDK/MedQ  DD:  07/26/2006  DT:  07/26/2006  Job #:  [id number]

## 2011-03-06 ENCOUNTER — Encounter: Payer: Self-pay | Admitting: Cardiology

## 2011-03-16 ENCOUNTER — Encounter: Payer: Self-pay | Admitting: Cardiology

## 2011-03-16 ENCOUNTER — Ambulatory Visit (INDEPENDENT_AMBULATORY_CARE_PROVIDER_SITE_OTHER): Payer: Medicare Other | Admitting: Cardiology

## 2011-03-16 DIAGNOSIS — R791 Abnormal coagulation profile: Secondary | ICD-10-CM

## 2011-03-16 DIAGNOSIS — C439 Malignant melanoma of skin, unspecified: Secondary | ICD-10-CM

## 2011-03-16 DIAGNOSIS — R5383 Other fatigue: Secondary | ICD-10-CM

## 2011-03-16 DIAGNOSIS — R7989 Other specified abnormal findings of blood chemistry: Secondary | ICD-10-CM

## 2011-03-16 DIAGNOSIS — R5381 Other malaise: Secondary | ICD-10-CM

## 2011-03-16 MED ORDER — FUROSEMIDE 40 MG PO TABS: 40.0000 mg | ORAL_TABLET | Freq: Every day | ORAL | Status: DC

## 2011-03-16 NOTE — Assessment & Plan Note (Signed)
We know that the patient has a very high d-dimer.  This was seen in the hospital with no explanation.  It was rechecked recently to see if it did change in urine remains the same.

## 2011-03-16 NOTE — Assessment & Plan Note (Signed)
Today was the first time that I have learned about the melanoma removed from his arm.  I am hoping that none of the symptomatology is related to this.  Up to this point there's been no proof that he has any metastatic disease.

## 2011-03-16 NOTE — Progress Notes (Signed)
HPI Patient is seen for cardiology followup.  I saw him last rate of 14, 2012.  He is fatigued.  He has some shortness of breath.  Is not having any significant chest pain.  Blood pressure was low.  Diltiazem was held.  Blood pressure is again low today.  He is not having syncope or presyncope.  Patient tells me that he had a melanoma removed from his left arm with a wide excision in the past month or 2.  The area is completely healed. Allergies  Allergen Reactions  . Codeine     Current Outpatient Prescriptions  Medication Sig Dispense Refill  . acetaminophen (TYLENOL) 500 MG tablet Take 500 mg by mouth 2 (two) times daily.        Marland Kitchen allopurinol (ZYLOPRIM) 300 MG tablet Take 300 mg by mouth daily.        Marland Kitchen aspirin 81 MG tablet Take 1 tablet (81 mg total) by mouth daily.      Marland Kitchen atenolol (TENORMIN) 50 MG tablet Take 50 mg by mouth daily.        . Cholecalciferol (VITAMIN D) 1000 UNITS capsule Take 1,000 Units by mouth daily.        . fenofibrate 54 MG tablet Take 54 mg by mouth 2 (two) times daily.        . furosemide (LASIX) 40 MG tablet Take 80 mg by mouth daily.        Marland Kitchen glimepiride (AMARYL) 2 MG tablet Take 2 mg by mouth daily before breakfast.        . glucose blood (FREESTYLE LITE) test strip 2 each by Other route daily. Use as instructed       . isosorbide mononitrate (IMDUR) 30 MG 24 hr tablet Take 30 mg by mouth daily.        . Omega-3 Fatty Acids (FISH OIL) 1000 MG CAPS 2 tabs po qd       . pantoprazole (PROTONIX) 40 MG tablet Take 40 mg by mouth daily.        Marland Kitchen diltiazem (CARDIZEM CD) 180 MG 24 hr capsule Take 180 mg by mouth daily.        . Lancets (FREESTYLE) lancets 2 each by Other route daily. Use as instructed       . nitroGLYCERIN (NITROSTAT) 0.4 MG SL tablet Place 0.4 mg under the tongue every 5 (five) minutes as needed.          History   Social History  . Marital Status: Married    Spouse Name: N/A    Number of Children: N/A  . Years of Education: N/A    Occupational History  . [employer]    Social History Main Topics  . Smoking status: Former Smoker    Quit date: 09/17/1996  . Smokeless tobacco: Not on file  . Alcohol Use: No  . Drug Use: Not on file  . Sexually Active: Not on file   Other Topics Concern  . Not on file   Social History Narrative   Married    Family History  Problem Relation Age of Onset  . Prostate cancer    . Kidney cancer    . Cancer      Bladder cancer  . Coronary artery disease      Past Medical History  Diagnosis Date  . Obesity   . Edema   . DM (diabetes mellitus)   . Renal insufficiency      CKD... creatinine up to 1.5 in hospital after  dye in October, 2011  . Fatigue   . Nephrolithiasis   . Gout   . Depression   . Skin cancer   . CAD (coronary artery disease)     stent 2000 /  cath,stent patent 2006 / cath 2007,stent probably patenet ,but difficult to assess / cath 07/2010, patent LAD stent with mild instent restenosis, moderagte elevation LVEDP  . Sleep apnea     CPAP,  [name]  . Ventral hernia   . Sinusitis   . Back problem   . Hyperlipidemia   . HTN (hypertension)   . OSA (obstructive sleep apnea)      CPAP.. Dr. Craige Cotta  . Chest pain     [facility] 07/2010  . Drug therapy     Ntg intolerance  . Positive D dimer     significant elevation ,hospital 07/2010, etiology unclear  . Ejection fraction     55%, 07/2010, mild inferior hypo  . SOB (shortness of breath)     mild restrictive defect on PFTs...likely due to obesity and some volume overload  . Dizziness     Mild positional dizziness May, 2012  . History of pneumonia   . Melanoma     Melanoma removed from the left forearm with wide excision May, 2012    Past Surgical History  Procedure Date  . Nose surgery   . Cardiac catheterization   . Coronary stent placement 2000    CAD    ROS  Patient denies fever, chills, headache, sweats, rash, change in vision, change in hearing, chest pain, cough, nausea vomiting,  urinary symptoms.  All of the systems are reviewed and are negative.  PHYSICAL EXAM Patient is overweight and stable.  Head is atraumatic.  Lungs are clear.  Respiratory effort is nonlabored.  Cardiac exam reveals S1-S2.  No clicks or significant murmurs.  There is no jugular venous distention.  No peripheral edema.  No skin rashes. Filed Vitals:   03/16/11 1609  BP: 98/60  Pulse: 72  Height: 5' 10.5" (1.791 m)  Weight: 256 lb (116.121 kg)    EKG  Is Not done today.  ASSESSMENT & PLAN

## 2011-03-16 NOTE — Patient Instructions (Signed)
Labs tomorrow at [facility] office Continue to hold your Diltiazem and hold your Imdur Decrease Furosemide to 40 mg daily Your physician recommends that you schedule a follow-up appointment in: 4 weeks

## 2011-03-16 NOTE — Assessment & Plan Note (Signed)
At this time the etiology of his fatigue is still not clear.  His blood pressure continues to be low.  He normally put on hold.  Lasix dose will be decreased to 40 mg daily.  I have not yet changed his beta blocker dose.  We met will be checked along with a TSH and CBC.

## 2011-03-17 ENCOUNTER — Telehealth: Payer: Self-pay | Admitting: Cardiology

## 2011-03-17 ENCOUNTER — Other Ambulatory Visit (INDEPENDENT_AMBULATORY_CARE_PROVIDER_SITE_OTHER): Payer: Medicare Other

## 2011-03-17 DIAGNOSIS — R5381 Other malaise: Secondary | ICD-10-CM

## 2011-03-17 DIAGNOSIS — R5383 Other fatigue: Secondary | ICD-10-CM

## 2011-03-17 LAB — BASIC METABOLIC PANEL
Calcium: 9.6 mg/dL (ref 8.4–10.5)
Chloride: 103 mEq/L (ref 96–112)
Creatinine, Ser: 1.4 mg/dL (ref 0.4–1.5)

## 2011-03-17 LAB — CBC WITH DIFFERENTIAL/PLATELET
Basophils Relative: 0.4 % (ref 0.0–3.0)
Eosinophils Relative: 1.5 % (ref 0.0–5.0)
Lymphocytes Relative: 40.6 % (ref 12.0–46.0)
MCV: 95 fl (ref 78.0–100.0)
Monocytes Relative: 7.8 % (ref 3.0–12.0)
Neutrophils Relative %: 49.7 % (ref 43.0–77.0)
RBC: 4.49 Mil/uL (ref 4.22–5.81)
WBC: 7 10*3/uL (ref 4.5–10.5)

## 2011-03-17 NOTE — Telephone Encounter (Signed)
I talked with pt's wife. Pt has been on Lasix 80mg  bid since DC from hospital 08/09/10. Our records stated Lasix 40mg  bid. Dr Myrtis Ser asked pt to decrease lasix  in half  to 40mg  daily yesterday because records indicated 40mg  bid. Pt will decrease Lasix to 80mg  daily since pt has been taking Lasix 80mg  bid.

## 2011-03-17 NOTE — Telephone Encounter (Signed)
Pt's wife calling re medication being reduced wife confused on furosimide 80mg  can't give him his dose until she knows how much to give him

## 2011-04-09 ENCOUNTER — Encounter: Payer: Self-pay | Admitting: Cardiology

## 2011-04-09 ENCOUNTER — Ambulatory Visit (INDEPENDENT_AMBULATORY_CARE_PROVIDER_SITE_OTHER): Payer: Medicare Other | Admitting: Cardiology

## 2011-04-09 DIAGNOSIS — R5381 Other malaise: Secondary | ICD-10-CM

## 2011-04-09 DIAGNOSIS — I2510 Atherosclerotic heart disease of native coronary artery without angina pectoris: Secondary | ICD-10-CM

## 2011-04-09 DIAGNOSIS — R5383 Other fatigue: Secondary | ICD-10-CM

## 2011-04-09 NOTE — Assessment & Plan Note (Signed)
Patient is feeling better.  I believe that his fatigue and dizziness was probably somewhat related to low blood pressure.  May lower his diuretic dose and had taken him off diltiazem.  I considered stopping his Imdur but decided to continue it.

## 2011-04-09 NOTE — Progress Notes (Signed)
HPI Patient is seen for followup of coronary disease, fatigue, dizziness.  When I saw him last week of his meds back further.  Also labs were checked.  His creatinine is 1.4.  His hemoglobin was stable thyroid function were stable.  He now is feeling relatively well.  I believe many of his symptoms were related to low blood pressure Allergies  Allergen Reactions  . Codeine     Current Outpatient Prescriptions  Medication Sig Dispense Refill  . acetaminophen (TYLENOL) 500 MG tablet Take 500 mg by mouth 2 (two) times daily.        Marland Kitchen allopurinol (ZYLOPRIM) 300 MG tablet Take 300 mg by mouth daily.        Marland Kitchen aspirin 81 MG tablet Take 1 tablet (81 mg total) by mouth daily.      Marland Kitchen atenolol (TENORMIN) 50 MG tablet Take 50 mg by mouth daily.        . Cholecalciferol (VITAMIN D) 1000 UNITS capsule Take 1,000 Units by mouth daily.        Marland Kitchen diltiazem (CARDIZEM CD) 180 MG 24 hr capsule Take 180 mg by mouth daily.        . fenofibrate 54 MG tablet Take 54 mg by mouth 2 (two) times daily.        . furosemide (LASIX) 80 MG tablet Take 1 tablet (80 mg total) by mouth daily.      Marland Kitchen glimepiride (AMARYL) 2 MG tablet Take 2 mg by mouth daily before breakfast.        . glucose blood (FREESTYLE LITE) test strip 2 each by Other route daily. Use as instructed       . isosorbide mononitrate (IMDUR) 30 MG 24 hr tablet Take 30 mg by mouth daily.        . Lancets (FREESTYLE) lancets 2 each by Other route daily. Use as instructed       . nitroGLYCERIN (NITROSTAT) 0.4 MG SL tablet Place 0.4 mg under the tongue every 5 (five) minutes as needed.        . Omega-3 Fatty Acids (FISH OIL) 1000 MG CAPS 2 tabs po qd       . pantoprazole (PROTONIX) 40 MG tablet Take 40 mg by mouth daily.          History   Social History  . Marital Status: Married    Spouse Name: N/A    Number of Children: N/A  . Years of Education: N/A   Occupational History  . [employer]    Social History Main Topics  . Smoking status: Former Smoker    Quit date: 09/17/1996  . Smokeless tobacco: Not on file  . Alcohol Use: No  . Drug Use: Not on file  . Sexually Active: Not on file   Other Topics Concern  . Not on file   Social History Narrative   Married    Family History  Problem Relation Age of Onset  . Prostate cancer    . Kidney cancer    . Cancer      Bladder cancer  . Coronary artery disease      Past Medical History  Diagnosis Date  . Obesity   . Edema   . DM (diabetes mellitus)   . Renal insufficiency      CKD... creatinine up to 1.5 in hospital after dye in October, 2011  . Fatigue   . Nephrolithiasis   . Gout   . Depression   . Skin cancer   .  CAD (coronary artery disease)     stent 2000 /  cath,stent patent 2006 / cath 2007,stent probably patenet ,but difficult to assess / cath 07/2010, patent LAD stent with mild instent restenosis, moderagte elevation LVEDP  . Sleep apnea     CPAP,  [name]  . Ventral hernia   . Sinusitis   . Back problem   . Hyperlipidemia   . HTN (hypertension)   . OSA (obstructive sleep apnea)      CPAP.. Dr. Craige Cotta  . Chest pain     See [facility] hospital 07/2010  . Drug therapy     Ntg intolerance  . Positive D dimer     significant elevation ,hospital 07/2010, etiology unclear  . Ejection fraction     55%, 07/2010, mild inferior hypo  . SOB (shortness of breath)     mild restrictive defect on PFTs...likely due to obesity and some volume overload  . Dizziness     Mild positional dizziness May, 2012  . History of pneumonia   . Melanoma     Melanoma removed from the left forearm with wide excision May, 2012    Past Surgical History  Procedure Date  . Nose surgery   . Cardiac catheterization   . Coronary stent placement 2000    CAD    ROS  Patient denies fever, chills, headache, sweats, rash, change in vision, change in hearing, chest pain, cough, nausea vomiting, urinary symptoms.  All other systems are reviewed and are negative.  PHYSICAL EXAM Patient is stable.   He is overweight.  He is oriented to person time and place.  Affect is normal.  Head is atraumatic.  Lungs are clear.  Respiratory effort is unlabored.  Cardiac exam reveals S1 and S2.  No clicks or significant murmurs.  Abdomen is protuberant but soft.  There is no peripheral edema. Filed Vitals:   04/09/11 1436  BP: 108/58  Pulse: 71  Height: 5' 10.5" (1.791 m)  Weight: 256 lb 12.8 oz (116.484 kg)    EKG  Is not done today. ASSESSMENT & PLAN

## 2011-04-09 NOTE — Patient Instructions (Signed)
Your physician recommends that you schedule a follow-up appointment in: 3 months.  

## 2011-04-09 NOTE — Assessment & Plan Note (Signed)
Coronary disease is stable.  No further workup. 

## 2011-06-01 ENCOUNTER — Telehealth: Payer: Self-pay | Admitting: Cardiology

## 2011-06-01 MED ORDER — NITROGLYCERIN 0.4 MG SL SUBL: 0.4000 mg | SUBLINGUAL_TABLET | SUBLINGUAL | Status: DC | PRN

## 2011-06-01 NOTE — Telephone Encounter (Signed)
[facility] in [place] [phone]

## 2011-06-02 NOTE — Telephone Encounter (Signed)
Pt's wife calling back re going out of town and needs refill of nitro, requested yesterday, asking if can be called in today

## 2011-07-01 ENCOUNTER — Encounter: Payer: Self-pay | Admitting: Cardiology

## 2011-07-01 ENCOUNTER — Ambulatory Visit (INDEPENDENT_AMBULATORY_CARE_PROVIDER_SITE_OTHER): Payer: Medicare Other | Admitting: Cardiology

## 2011-07-01 VITALS — BP 122/64 | HR 68 | Ht 70.5 in | Wt 258.8 lb

## 2011-07-01 DIAGNOSIS — I2510 Atherosclerotic heart disease of native coronary artery without angina pectoris: Secondary | ICD-10-CM

## 2011-07-01 NOTE — Assessment & Plan Note (Signed)
Coronary disease is stable.  No change in therapy.  Plan six-month followup.

## 2011-07-01 NOTE — Patient Instructions (Signed)
Your physician recommends that you schedule a follow-up appointment in: 6 months with Dr. Katz  

## 2011-07-01 NOTE — Progress Notes (Signed)
HPI Patient is seen today to followup coronary disease.  He had some fatigue and I have lowered his medicines.  He's actually done quite well with this.  He was able to go on a cruise to [place].  Despite cruising he has only gained 2 pounds.  This is a good sign.  Is not having chest pain or shortness of breath. Allergies  Allergen Reactions  . Codeine     Current Outpatient Prescriptions  Medication Sig Dispense Refill  . acetaminophen (TYLENOL) 500 MG tablet Take 500 mg by mouth 2 (two) times daily.        Marland Kitchen allopurinol (ZYLOPRIM) 300 MG tablet Take 300 mg by mouth daily.        Marland Kitchen aspirin 81 MG tablet Take 1 tablet (81 mg total) by mouth daily.      Marland Kitchen atenolol (TENORMIN) 50 MG tablet Take 50 mg by mouth daily.        . Cholecalciferol (VITAMIN D) 1000 UNITS capsule Take 1,000 Units by mouth daily.        . fenofibrate 54 MG tablet Take 54 mg by mouth 2 (two) times daily.        . furosemide (LASIX) 80 MG tablet Take 1 tablet (80 mg total) by mouth daily.      Marland Kitchen glimepiride (AMARYL) 2 MG tablet Take 2 mg by mouth daily before breakfast.        . glucose blood (FREESTYLE LITE) test strip 2 each by Other route daily. Use as instructed       . isosorbide mononitrate (IMDUR) 30 MG 24 hr tablet Take 30 mg by mouth daily.        . Lancets (FREESTYLE) lancets 2 each by Other route daily. Use as instructed       . nitroGLYCERIN (NITROSTAT) 0.4 MG SL tablet Place 1 tablet (0.4 mg total) under the tongue every 5 (five) minutes as needed.  25 tablet  2  . Omega-3 Fatty Acids (FISH OIL) 1000 MG CAPS 2 tabs po qd       . pantoprazole (PROTONIX) 40 MG tablet Take 40 mg by mouth daily.          History   Social History  . Marital Status: Married    Spouse Name: N/A    Number of Children: N/A  . Years of Education: N/A   Occupational History  . [employer]    Social History Main Topics  . Smoking status: Former Smoker    Quit date: 09/17/1996  . Smokeless tobacco: Not on file  . Alcohol Use: No    . Drug Use: Not on file  . Sexually Active: Not on file   Other Topics Concern  . Not on file   Social History Narrative   Married    Family History  Problem Relation Age of Onset  . Prostate cancer    . Kidney cancer    . Cancer      Bladder cancer  . Coronary artery disease      Past Medical History  Diagnosis Date  . Obesity   . Edema   . DM (diabetes mellitus)   . Renal insufficiency      CKD... creatinine up to 1.5 in hospital after dye in October, 2011  . Fatigue   . Nephrolithiasis   . Gout   . Depression   . Skin cancer   . CAD (coronary artery disease)     stent 2000 /  cath,stent patent 2006 /  cath 2007,stent probably patenet ,but difficult to assess / cath 07/2010, patent LAD stent with mild instent restenosis, moderagte elevation LVEDP  . Sleep apnea     CPAP,  [name]  . Ventral hernia   . Sinusitis   . Back problem   . Hyperlipidemia   . HTN (hypertension)   . OSA (obstructive sleep apnea)      CPAP.. Dr. Craige Cotta  . Chest pain     See data hospital 07/2010  . Drug therapy     Ntg intolerance  . Positive D dimer     significant elevation ,hospital 07/2010, etiology unclear  . Ejection fraction     55%, 07/2010, mild inferior hypo  . SOB (shortness of breath)     mild restrictive defect on PFTs...likely due to obesity and some volume overload  . Dizziness     Mild positional dizziness May, 2012  . History of pneumonia   . Melanoma     Melanoma removed from the left forearm with wide excision May, 2012    Past Surgical History  Procedure Date  . Nose surgery   . Cardiac catheterization   . Coronary stent placement 2000    CAD    ROS  Patient denies fever, chills, headache, sweats, rash, change in vision, change in hearing, chest pain, cough, nausea vomiting, urinary symptoms.  All other systems are reviewed and are negative.  PHYSICAL EXAM Patient is stable.  Head is atraumatic.  There is no jugular venous distention.  Lungs are clear.   Respiratory effort is nonlabored. .  Cardiac exam reveals S1 and S2.  No clicks or significant murmurs.Abdomen is protuberant but soft.  There is no peripheral edema. Filed Vitals:   07/01/11 1348  BP: 122/64  Pulse: 68  Height: 5' 10.5" (1.791 m)  Weight: 258 lb 12.8 oz (117.391 kg)    EEKG is not done today. ASSESSMENT & PLAN

## 2011-08-20 ENCOUNTER — Other Ambulatory Visit: Payer: Self-pay | Admitting: Cardiology

## 2011-08-20 ENCOUNTER — Other Ambulatory Visit: Payer: Self-pay | Admitting: *Deleted

## 2011-08-20 MED ORDER — FENOFIBRATE 54 MG PO TABS: 54.0000 mg | ORAL_TABLET | Freq: Two times a day (BID) | ORAL | Status: DC

## 2011-08-20 MED ORDER — GLIMEPIRIDE 2 MG PO TABS: 2.0000 mg | ORAL_TABLET | Freq: Every day | ORAL | Status: DC

## 2011-08-20 MED ORDER — FUROSEMIDE 80 MG PO TABS: 80.0000 mg | ORAL_TABLET | Freq: Every day | ORAL | Status: DC

## 2011-08-20 MED ORDER — ATENOLOL 50 MG PO TABS: 50.0000 mg | ORAL_TABLET | Freq: Every day | ORAL | Status: DC

## 2011-08-20 MED ORDER — ALLOPURINOL 300 MG PO TABS: 300.0000 mg | ORAL_TABLET | Freq: Every day | ORAL | Status: DC

## 2011-08-24 NOTE — Telephone Encounter (Signed)
Spoke with [facility], there was changes in med done a few times -- verified dosage was 80 mg once daily

## 2011-08-24 NOTE — Telephone Encounter (Signed)
[facility] called and has a question regarding Lasix.  Please  Call [phone] ref number [id number]

## 2011-09-24 ENCOUNTER — Ambulatory Visit (INDEPENDENT_AMBULATORY_CARE_PROVIDER_SITE_OTHER): Payer: Medicare Other | Admitting: Internal Medicine

## 2011-09-24 ENCOUNTER — Ambulatory Visit (INDEPENDENT_AMBULATORY_CARE_PROVIDER_SITE_OTHER)
Admission: RE | Admit: 2011-09-24 | Discharge: 2011-09-24 | Disposition: A | Discharge status: Home / Self Care | Payer: Medicare Other | Source: Ambulatory Visit | Attending: Internal Medicine | Admitting: Internal Medicine

## 2011-09-24 VITALS — BP 118/62 | HR 67 | Temp 98.0°F | Wt 258.0 lb

## 2011-09-24 DIAGNOSIS — J069 Acute upper respiratory infection, unspecified: Secondary | ICD-10-CM

## 2011-09-24 DIAGNOSIS — M542 Cervicalgia: Secondary | ICD-10-CM

## 2011-09-24 NOTE — Patient Instructions (Signed)
Sore throat - no sign of bacterial infection. Probably viral. Plan - vitamin C, fluids, ecchinacea, may take sudafed (generic) 30 mg twice or three times day.  Neck -pain - on exam no clear cut radicular findings but highly suspicious for some type of derangement. Plan - cervical x-rays. For any abnormality, given your symptoms we will move on to MRI. For the pain you may take aleve twice a day.

## 2011-09-24 NOTE — Progress Notes (Signed)
Subjective:    Patient ID: William Hendrix, male    DOB: 1940/11/06, 70 y.o.   MRN: [id number]  HPI Mfr. Reish fell 5-6 weeks ago - ladder went out from under him going down 12-14 feet. He sustained abrasions to the shin, he hit the side of his face snapping his neck around. He was seen at Urgent [facility]) on [address]. He had x-ray of the back, neck without acute injury. He has had progress pain in between shoulder blades with radiation to the shoulder and arm. He has real limitation in movement of the neck with flexion or extension.  As a side issue he has had a sore throat, no fever, cough productive of a colored sputum.   Past Medical History  Diagnosis Date  . Obesity   . Edema   . DM (diabetes mellitus)   . Renal insufficiency      CKD... creatinine up to 1.5 in hospital after dye in October, 2011  . Fatigue   . Nephrolithiasis   . Gout   . Depression   . Skin cancer   . CAD (coronary artery disease)     stent 2000 /  cath,stent patent 2006 / cath 2007,stent probably patenet ,but difficult to assess / cath 07/2010, patent LAD stent with mild instent restenosis, moderagte elevation LVEDP  . Sleep apnea     CPAP,  [name]  . Ventral hernia   . Sinusitis   . Back problem   . Hyperlipidemia   . HTN (hypertension)   . OSA (obstructive sleep apnea)      CPAP.. Dr. Craige Cotta  . Chest pain     See data hospital 07/2010  . Drug therapy     Ntg intolerance  . Positive D dimer     significant elevation ,hospital 07/2010, etiology unclear  . Ejection fraction     55%, 07/2010, mild inferior hypo  . SOB (shortness of breath)     mild restrictive defect on PFTs...likely due to obesity and some volume overload  . Dizziness     Mild positional dizziness May, 2012  . History of pneumonia   . Melanoma     Melanoma removed from the left forearm with wide excision May, 2012   Past Surgical History  Procedure Date  . Nose surgery   . Cardiac catheterization   . Coronary stent  placement 2000    CAD   Family History  Problem Relation Age of Onset  . Prostate cancer    . Kidney cancer    . Cancer      Bladder cancer  . Coronary artery disease     History   Social History  . Marital Status: Married    Spouse Name: N/A    Number of Children: N/A  . Years of Education: N/A   Occupational History  . [employer]    Social History Main Topics  . Smoking status: Former Smoker    Quit date: 09/17/1996  . Smokeless tobacco: Not on file  . Alcohol Use: No  . Drug Use: Not on file  . Sexually Active: Not on file   Other Topics Concern  . Not on file   Social History Narrative   Married       Review of Systems System review is negative for any constitutional, cardiac, pulmonary, GI or neuro symptoms or complaints other than as described in the HPI.      Objective:   Physical Exam Vitals - normal, afebrile Gen'l -  overwieght white man in no acute distress HEENT - Throat clear, TMs normal, No sinus tenderness Nodes - negative cervical region Neck - no thyromegaly. Decreased ROM - especially with extension and rotation Lungs- CTAP Cor - RRR Neuro - A&O x 3, MS 5/5, DTRs 2+ and symmetrical, sensation chronic decreased sensation distal left UE and hand.       Assessment & Plan:  1. Neck pain - post traumatic neck pain without radicular findings. Concern for HNP vs severe strain. Plan - c-spine series            NSAIDs            Gentle ROM  Addendum - C-spine series w/o acute change  2. Viro URI - supportive care.

## 2011-09-25 ENCOUNTER — Other Ambulatory Visit: Payer: Self-pay | Admitting: Internal Medicine

## 2011-09-27 ENCOUNTER — Telehealth: Payer: Self-pay | Admitting: Internal Medicine

## 2011-09-27 NOTE — Telephone Encounter (Signed)
Cervical spine films with no significant sign of injury or anatomic derangement.

## 2011-09-28 NOTE — Telephone Encounter (Signed)
Called pt - his neck is a little better. Gave results of x-ray - no evidence of injury. Plan - tincture of time  URI- still with congestion, cough, laryngitis, no fever. Plan - continue sudafed, Robitussin DM, vaporizer treatments.

## 2011-11-10 ENCOUNTER — Telehealth: Payer: Self-pay | Admitting: Internal Medicine

## 2011-11-10 ENCOUNTER — Other Ambulatory Visit: Payer: Self-pay | Admitting: Cardiology

## 2011-11-10 MED ORDER — ATENOLOL 50 MG PO TABS: 50.0000 mg | ORAL_TABLET | Freq: Every day | ORAL | Status: DC

## 2011-11-10 MED ORDER — FENOFIBRATE 54 MG PO TABS: 54.0000 mg | ORAL_TABLET | Freq: Two times a day (BID) | ORAL | Status: DC

## 2011-11-10 MED ORDER — ALLOPURINOL 300 MG PO TABS: 300.0000 mg | ORAL_TABLET | Freq: Every day | ORAL | Status: DC

## 2011-11-10 NOTE — Telephone Encounter (Signed)
The pt called and is hoping to get a refill of Allopurinol 300mg  sent through [facility].  The pt mentioned that they have changed from [facility] and the new dr fax is [phone].  Thanks!

## 2011-11-10 NOTE — Telephone Encounter (Signed)
Done

## 2011-11-10 NOTE — Telephone Encounter (Signed)
New Refill  Patient says pharmacy has changed to   [facility]   Fax [phone]  All prescriptions should be processed with 3 month refills through [facility]

## 2011-12-07 ENCOUNTER — Other Ambulatory Visit: Payer: Self-pay | Admitting: *Deleted

## 2011-12-07 MED ORDER — FUROSEMIDE 80 MG PO TABS: 80.0000 mg | ORAL_TABLET | Freq: Every day | ORAL | Status: DC

## 2011-12-28 ENCOUNTER — Ambulatory Visit: Payer: Medicare Other | Admitting: Cardiology

## 2012-02-12 ENCOUNTER — Ambulatory Visit (INDEPENDENT_AMBULATORY_CARE_PROVIDER_SITE_OTHER): Payer: Medicare Other | Admitting: Cardiology

## 2012-02-12 ENCOUNTER — Encounter: Payer: Self-pay | Admitting: Cardiology

## 2012-02-12 ENCOUNTER — Ambulatory Visit: Payer: Medicare Other | Admitting: Cardiology

## 2012-02-12 VITALS — BP 108/68 | HR 65 | Ht 70.5 in | Wt 255.0 lb

## 2012-02-12 DIAGNOSIS — R0602 Shortness of breath: Secondary | ICD-10-CM

## 2012-02-12 DIAGNOSIS — R609 Edema, unspecified: Secondary | ICD-10-CM

## 2012-02-12 DIAGNOSIS — I2510 Atherosclerotic heart disease of native coronary artery without angina pectoris: Secondary | ICD-10-CM

## 2012-02-12 NOTE — Assessment & Plan Note (Signed)
He's not having any significant shortness of breath at this time. No change in therapy.

## 2012-02-12 NOTE — Progress Notes (Signed)
HPI Patient is seen for cardiology followup. I saw him last September, 2012. He has known coronary disease. He has been stable. At times he has a brief cramping sensation in his chest when sitting still. It is not exertional. It does not sound like angina for him. His last catheterization was October, 2011. The stent to his LAD was patent with mild in-stent restenosis. Overall he's stable.  Allergies  Allergen Reactions  . Codeine     Current Outpatient Prescriptions  Medication Sig Dispense Refill  . acetaminophen (TYLENOL) 500 MG tablet Take 500 mg by mouth 2 (two) times daily.        Marland Kitchen allopurinol (ZYLOPRIM) 300 MG tablet Take 1 tablet (300 mg total) by mouth daily.  90 tablet  3  . aspirin 81 MG tablet Take 1 tablet (81 mg total) by mouth daily.      Marland Kitchen atenolol (TENORMIN) 50 MG tablet Take 1 tablet (50 mg total) by mouth daily.  90 tablet  2  . Cholecalciferol (VITAMIN D) 1000 UNITS capsule Take 1,000 Units by mouth daily.        . fenofibrate 54 MG tablet Take 1 tablet (54 mg total) by mouth 2 (two) times daily.  180 tablet  2  . FREESTYLE LITE test strip TEST BLOOD SUGAR TWICE DAILY AS DIRECTED  180 each  3  . furosemide (LASIX) 80 MG tablet Take 1 tablet (80 mg total) by mouth daily.  90 tablet  2  . glimepiride (AMARYL) 2 MG tablet Take 1 tablet (2 mg total) by mouth daily before breakfast.  90 tablet  3  . Lancets (FREESTYLE) lancets TEST TWICE DAILY AS DIRECTED BY PHYSICIAN  180 each  3  . nitroGLYCERIN (NITROSTAT) 0.4 MG SL tablet Place 1 tablet (0.4 mg total) under the tongue every 5 (five) minutes as needed.  25 tablet  2  . Omega-3 Fatty Acids (FISH OIL) 1000 MG CAPS 2 tabs po qd       . pantoprazole (PROTONIX) 40 MG tablet Take 40 mg by mouth daily.          History   Social History  . Marital Status: Married    Spouse Name: N/A    Number of Children: N/A  . Years of Education: N/A   Occupational History  . [employer]    Social History Main Topics  . Smoking  status: Former Smoker    Quit date: 09/17/1996  . Smokeless tobacco: Not on file  . Alcohol Use: No  . Drug Use: Not on file  . Sexually Active: Not on file   Other Topics Concern  . Not on file   Social History Narrative   Married    Family History  Problem Relation Age of Onset  . Prostate cancer    . Kidney cancer    . Cancer      Bladder cancer  . Coronary artery disease      Past Medical History  Diagnosis Date  . Obesity   . Edema   . DM (diabetes mellitus)   . Renal insufficiency      CKD... creatinine up to 1.5 in hospital after dye in October, 2011  . Fatigue   . Nephrolithiasis   . Gout   . Depression   . Skin cancer   . CAD (coronary artery disease)     stent 2000 /  cath,stent patent 2006 / cath 2007,stent probably patenet ,but difficult to assess / cath 07/2010, patent LAD stent  with mild instent restenosis, moderagte elevation LVEDP  . Sleep apnea     CPAP,  [name]  . Ventral hernia   . Sinusitis   . Back problem   . Hyperlipidemia   . HTN (hypertension)   . OSA (obstructive sleep apnea)      CPAP.. Dr. Craige Cotta  . Chest pain     See data hospital 07/2010  . Drug therapy     Ntg intolerance  . Positive D dimer     significant elevation ,hospital 07/2010, etiology unclear  . Ejection fraction     55%, 07/2010, mild inferior hypo  . SOB (shortness of breath)     mild restrictive defect on PFTs...likely due to obesity and some volume overload  . Dizziness     Mild positional dizziness May, 2012  . History of pneumonia   . Melanoma     Melanoma removed from the left forearm with wide excision May, 2012    Past Surgical History  Procedure Date  . Nose surgery   . Cardiac catheterization   . Coronary stent placement 2000    CAD    ROS  Patient denies fever, chills, headache, sweats, rash, change in vision, change in hearing, chest pain, nausea vomiting, urinary symptoms. He did have a significant sinus infection and upper respiratory  infection that is now resolved. All other systems are reviewed and are negative.  PHYSICAL EXAM  Patient is losing weight but he is doing well in that he is losing weight over time. He is down 3 pounds since his last visit. His wife is here with him today. Lungs are clear. Respiratory effort is nonlabored. There is no jugulovenous distention. Cardiac exam reveals S1 and S2. There no clicks or significant murmurs. Abdomen is protuberant but soft. There is no peripheral edema.  Filed Vitals:   02/12/12 1508  BP: 108/68  Pulse: 65  Height: 5' 10.5" (1.791 m)  Weight: 255 lb (115.667 kg)   EKG is done today and reviewed by me. They're nonspecific ST-T wave changes. There is no new change.  ASSESSMENT & PLAN

## 2012-02-12 NOTE — Assessment & Plan Note (Signed)
Coronary disease is stable. No change in therapy. 

## 2012-02-12 NOTE — Assessment & Plan Note (Signed)
There is no peripheral edema. No change in therapy.

## 2012-02-12 NOTE — Patient Instructions (Signed)
Your physician wants you to follow-up in:  6 months. You will receive a reminder letter in the mail two months in advance. If you don't receive a letter, please call our office to schedule the follow-up appointment.   

## 2012-06-09 ENCOUNTER — Other Ambulatory Visit: Payer: Self-pay | Admitting: Cardiology

## 2012-06-09 MED ORDER — ATENOLOL 50 MG PO TABS: 50.0000 mg | ORAL_TABLET | Freq: Every day | ORAL | Status: DC

## 2012-06-09 MED ORDER — FENOFIBRATE 54 MG PO TABS: 54.0000 mg | ORAL_TABLET | Freq: Two times a day (BID) | ORAL | Status: DC

## 2012-08-08 ENCOUNTER — Encounter: Payer: Self-pay | Admitting: Internal Medicine

## 2012-08-08 ENCOUNTER — Ambulatory Visit (INDEPENDENT_AMBULATORY_CARE_PROVIDER_SITE_OTHER): Payer: Medicare Other | Admitting: Internal Medicine

## 2012-08-08 VITALS — BP 102/60 | HR 64 | Temp 98.5°F | Resp 16 | Wt 257.0 lb

## 2012-08-08 DIAGNOSIS — R3919 Other difficulties with micturition: Secondary | ICD-10-CM

## 2012-08-08 DIAGNOSIS — M48061 Spinal stenosis, lumbar region without neurogenic claudication: Secondary | ICD-10-CM

## 2012-08-08 DIAGNOSIS — R202 Paresthesia of skin: Secondary | ICD-10-CM

## 2012-08-08 DIAGNOSIS — M109 Gout, unspecified: Secondary | ICD-10-CM

## 2012-08-08 DIAGNOSIS — Z23 Encounter for immunization: Secondary | ICD-10-CM

## 2012-08-08 DIAGNOSIS — R197 Diarrhea, unspecified: Secondary | ICD-10-CM

## 2012-08-08 DIAGNOSIS — R39198 Other difficulties with micturition: Secondary | ICD-10-CM

## 2012-08-08 DIAGNOSIS — N259 Disorder resulting from impaired renal tubular function, unspecified: Secondary | ICD-10-CM

## 2012-08-08 DIAGNOSIS — E785 Hyperlipidemia, unspecified: Secondary | ICD-10-CM

## 2012-08-08 DIAGNOSIS — R209 Unspecified disturbances of skin sensation: Secondary | ICD-10-CM

## 2012-08-08 DIAGNOSIS — I2510 Atherosclerotic heart disease of native coronary artery without angina pectoris: Secondary | ICD-10-CM

## 2012-08-08 DIAGNOSIS — I10 Essential (primary) hypertension: Secondary | ICD-10-CM

## 2012-08-08 DIAGNOSIS — E119 Type 2 diabetes mellitus without complications: Secondary | ICD-10-CM

## 2012-08-08 DIAGNOSIS — R29898 Other symptoms and signs involving the musculoskeletal system: Secondary | ICD-10-CM

## 2012-08-08 NOTE — Patient Instructions (Addendum)
1. Back pain and leg weakness/numbness - by MRI '06 you had spinal stenosis from disk disease. The concern is that this has gotten worse in addition to a possible acute injury.  Plan MRI lumbar-sacral spine  Flexeril 5-10 mg every 8 hours for muscle spasm - this is not addictive  Increase tylenol to 1,000 mg three times a day.  2. Diarrhea - worsening of chronic problem. May be several causes, including drugs like protonix and fish oil   Plan Transition off protonix: start zantac 150 mg twice a day. After 3-5 days stop the protonix  Stop the fish oil  Ok to take immodium as directed to slow down the diarrhea  If stopping medication doesn't help will need to have GI consult  3. Diabetes - will check lab: A1C  4. Cholesterol - the fenofibrate you take doesn't cause diarrhea, more likely to cause constipation.  Plan Will check cholesterol panel.   5. Strong family history of bladder cancer  Plan - urine for cytology.

## 2012-08-09 ENCOUNTER — Other Ambulatory Visit (INDEPENDENT_AMBULATORY_CARE_PROVIDER_SITE_OTHER): Payer: Medicare Other

## 2012-08-09 DIAGNOSIS — E785 Hyperlipidemia, unspecified: Secondary | ICD-10-CM

## 2012-08-09 DIAGNOSIS — E119 Type 2 diabetes mellitus without complications: Secondary | ICD-10-CM

## 2012-08-09 DIAGNOSIS — I2510 Atherosclerotic heart disease of native coronary artery without angina pectoris: Secondary | ICD-10-CM

## 2012-08-09 DIAGNOSIS — N259 Disorder resulting from impaired renal tubular function, unspecified: Secondary | ICD-10-CM

## 2012-08-09 LAB — HEPATIC FUNCTION PANEL
ALT: 39 U/L (ref 0–53)
Albumin: 3.9 g/dL (ref 3.5–5.2)
Alkaline Phosphatase: 70 U/L (ref 39–117)
Bilirubin, Direct: 0 mg/dL (ref 0.0–0.3)
Total Protein: 7.4 g/dL (ref 6.0–8.3)

## 2012-08-09 LAB — URINALYSIS, ROUTINE W REFLEX MICROSCOPIC
Hgb urine dipstick: NEGATIVE
Ketones, ur: NEGATIVE
Urine Glucose: NEGATIVE
Urobilinogen, UA: 0.2 (ref 0.0–1.0)

## 2012-08-09 LAB — COMPREHENSIVE METABOLIC PANEL
ALT: 39 U/L (ref 0–53)
AST: 33 U/L (ref 0–37)
Albumin: 3.9 g/dL (ref 3.5–5.2)
Alkaline Phosphatase: 70 U/L (ref 39–117)
BUN: 33 mg/dL — ABNORMAL HIGH (ref 6–23)
Creatinine, Ser: 1.7 mg/dL — ABNORMAL HIGH (ref 0.4–1.5)
Potassium: 4.5 mEq/L (ref 3.5–5.1)

## 2012-08-09 LAB — LIPID PANEL
HDL: 18.5 mg/dL — ABNORMAL LOW (ref >39.00)
Total CHOL/HDL Ratio: 10
VLDL: 163.8 mg/dL — ABNORMAL HIGH (ref 0.0–40.0)

## 2012-08-09 LAB — HEMOGLOBIN A1C: Hgb A1c MFr Bld: 7.5 % — ABNORMAL HIGH (ref 4.6–6.5)

## 2012-08-10 DIAGNOSIS — M48061 Spinal stenosis, lumbar region without neurogenic claudication: Secondary | ICD-10-CM | POA: Insufficient documentation

## 2012-08-10 NOTE — Assessment & Plan Note (Signed)
LDL is excellent at 65.6. HDL remains very low.  Plan Continue fenofibrate

## 2012-08-10 NOTE — Assessment & Plan Note (Signed)
Lab Results  Component Value Date   HGBA1C 7.5* 08/09/2012   Above goal but below 8% threshold for medication change.  Plan Continue present medication but at some time need to consider coming of sulfonylurea due to increased risk of hypoglycemia  Increase efforts to reduce carbohydrates and to loose weight.

## 2012-08-10 NOTE — Progress Notes (Signed)
Subjective:    Patient ID: William Hendrix, male    DOB: [birth year], 71 y.o.   MRN: [id number]  HPI Mr. Kielbasa presents for increasing trouble with low back pain. He reports that he cannot walk far or long w/o debilitating pain in the low back. This is a long-term problem that has become much worse. He also reports a dense loss of sensation at the proximal left LE. He denies foot drop, collapse, distal paresthesia, loss of bladder control or bowel control. Last MRI L-S spine in '06 which did reveal spinal stenosis.  For some time he has had frequent loose stools along with several episodes of fecal incontinence. He is aware impending bowel movements but has insufficient time to always get to a bathroom. Denies any blood or mucus in the stool, denies rectal or lower abdominal pain. Has had no out of state travel, new foods or eaten at new restaruants, no change in medications.  He is due for follow-up of DM and cholesterol levels.  Past Medical History  Diagnosis Date  . Obesity   . Edema   . DM (diabetes mellitus)   . Renal insufficiency      CKD... creatinine up to 1.5 in hospital after dye in October, 2011  . Fatigue   . Nephrolithiasis   . Gout   . Depression   . Skin cancer   . CAD (coronary artery disease)     stent 2000 /  cath,stent patent 2006 / cath 2007,stent probably patenet ,but difficult to assess / cath 07/2010, patent LAD stent with mild instent restenosis, moderagte elevation LVEDP  . Sleep apnea     CPAP,  [name]  . Ventral hernia   . Sinusitis   . Back problem   . Hyperlipidemia   . HTN (hypertension)   . OSA (obstructive sleep apnea)      CPAP.. Dr. Craige Cotta  . Chest pain     See data hospital 07/2010  . Drug therapy     Ntg intolerance  . Positive D dimer     significant elevation ,hospital 07/2010, etiology unclear  . Ejection fraction     55%, 07/2010, mild inferior hypo  . SOB (shortness of breath)     mild restrictive defect on PFTs...likely due to  obesity and some volume overload  . Dizziness     Mild positional dizziness May, 2012  . History of pneumonia   . Melanoma     Melanoma removed from the left forearm with wide excision May, 2012   Past Surgical History  Procedure Date  . Nose surgery   . Cardiac catheterization   . Coronary stent placement 2000    CAD   Family History  Problem Relation Age of Onset  . Prostate cancer    . Kidney cancer    . Cancer      Bladder cancer  . Coronary artery disease     History   Social History  . Marital Status: Married    Spouse Name: N/A    Number of Children: N/A  . Years of Education: N/A   Occupational History  . [employer]    Social History Main Topics  . Smoking status: Former Smoker    Quit date: 09/17/1996  . Smokeless tobacco: Not on file  . Alcohol Use: No  . Drug Use: Not on file  . Sexually Active: Not on file   Other Topics Concern  . Not on file   Social History Narrative  Married    Current Outpatient Prescriptions on File Prior to Visit  Medication Sig Dispense Refill  . acetaminophen (TYLENOL) 500 MG tablet Take 500 mg by mouth 2 (two) times daily.        Marland Kitchen allopurinol (ZYLOPRIM) 300 MG tablet Take 1 tablet (300 mg total) by mouth daily.  90 tablet  3  . aspirin 81 MG tablet Take 1 tablet (81 mg total) by mouth daily.      Marland Kitchen atenolol (TENORMIN) 50 MG tablet Take 1 tablet (50 mg total) by mouth daily.  90 tablet  2  . Cholecalciferol (VITAMIN D) 1000 UNITS capsule Take 1,000 Units by mouth daily.        . fenofibrate 54 MG tablet Take 1 tablet (54 mg total) by mouth 2 (two) times daily.  180 tablet  2  . FREESTYLE LITE test strip TEST BLOOD SUGAR TWICE DAILY AS DIRECTED  180 each  3  . furosemide (LASIX) 80 MG tablet Take 1 tablet (80 mg total) by mouth daily.  90 tablet  2  . Lancets (FREESTYLE) lancets TEST TWICE DAILY AS DIRECTED BY PHYSICIAN  180 each  3  . nitroGLYCERIN (NITROSTAT) 0.4 MG SL tablet Place 1 tablet (0.4 mg total) under the  tongue every 5 (five) minutes as needed.  25 tablet  2  . Omega-3 Fatty Acids (FISH OIL) 1000 MG CAPS 2 tabs po qd       . pantoprazole (PROTONIX) 40 MG tablet Take 40 mg by mouth daily.        Marland Kitchen glimepiride (AMARYL) 2 MG tablet Take 1 tablet (2 mg total) by mouth daily before breakfast.  90 tablet  3      Review of Systems System review is negative for any constitutional, cardiac, pulmonary, GI or neuro symptoms or complaints other than as described in the HPI.     Objective:   Physical Exam Filed Vitals:   08/08/12 1651  BP: 102/60  Pulse: 64  Temp: 98.5 F (36.9 C)  Resp: 16   Wt Readings from Last 3 Encounters:  08/08/12 257 lb (116.574 kg)  02/12/12 255 lb (115.667 kg)  09/24/11 258 lb (117.028 kg)   Gen'l- heavyset, large-framed white man in no acute distress HEENT_ C&S clear w/o icterus, PERRLA Neck- supple, w/o thyromegaly Cor- 2+ radial pulses, RRR Pulm - normal respirations Abd - obese Neuro/MSK - Back exam: normal stand; normal flex to greater than 100 degrees; broad based gait and slightly favors left leg; difficulty with toe/heel walk with discomfort with heels up; normal step up to exam table; normal SLR sitting; normal DTRs at the patellar tendons;  no  CVA tenderness; able to move supine to sitting witout assistance.  Lab Results  Component Value Date   WBC 7.0 03/17/2011   HGB 14.5 03/17/2011   HCT 42.6 03/17/2011   PLT 208.0 03/17/2011   GLUCOSE 194* 08/09/2012   CHOL 179 08/09/2012   TRIG 819.0 Lipemic Triglyceride is over 400; calculations on Lipids are invalid.* 08/09/2012   HDL 18.50* 08/09/2012   LDLDIRECT 65.6 08/09/2012   LDLCALC  Value: UNABLE TO CALCULATE IF TRIGLYCERIDE OVER 400 mg/dL         16/07/9603        ALT 39 08/09/2012   AST 33 08/09/2012        NA 136 08/09/2012   K 4.5 08/09/2012   CL 100 08/09/2012   CREATININE 1.7* 08/09/2012   BUN 33* 08/09/2012   CO2 29 08/09/2012  TSH 2.91 03/17/2011   INR 0.98 08/05/2010   HGBA1C 7.5*  08/09/2012         Assessment & Plan:   Diarrhea - worsening of chronic problem. May be several causes, including drugs like protonix and fish oil   Plan Transition off protonix: start zantac 150 mg twice a day. After 3-5 days stop the protonix  Stop the fish oil  Ok to take immodium as directed to slow down the diarrhea  If stopping medication doesn't help will need to have GI consult

## 2012-08-10 NOTE — Assessment & Plan Note (Signed)
BP Readings from Last 3 Encounters:  08/08/12 102/60  02/12/12 108/68  09/24/11 118/62   Very good control on present medications

## 2012-08-10 NOTE — Assessment & Plan Note (Addendum)
1. Back pain and leg weakness/numbness - by MRI '06 you had spinal stenosis from disk disease. The concern is that this has gotten worse in addition to a possible acute injury.  Plan MRI lumbar-sacral spine  Flexeril 5-10 mg every 8 hours for muscle spasm - this is not addictive  Increase tylenol to 1,000 mg three times a day.

## 2012-08-11 ENCOUNTER — Ambulatory Visit (HOSPITAL_COMMUNITY)
Admission: RE | Admit: 2012-08-11 | Discharge: 2012-08-11 | Disposition: A | Discharge status: Home / Self Care | Payer: Medicare Other | Source: Ambulatory Visit | Attending: Internal Medicine | Admitting: Internal Medicine

## 2012-08-11 DIAGNOSIS — M545 Low back pain, unspecified: Secondary | ICD-10-CM | POA: Insufficient documentation

## 2012-08-11 DIAGNOSIS — M79609 Pain in unspecified limb: Secondary | ICD-10-CM | POA: Insufficient documentation

## 2012-08-11 DIAGNOSIS — R202 Paresthesia of skin: Secondary | ICD-10-CM

## 2012-08-11 DIAGNOSIS — M48061 Spinal stenosis, lumbar region without neurogenic claudication: Secondary | ICD-10-CM | POA: Insufficient documentation

## 2012-08-11 DIAGNOSIS — R29898 Other symptoms and signs involving the musculoskeletal system: Secondary | ICD-10-CM

## 2012-08-12 ENCOUNTER — Encounter: Payer: Self-pay | Admitting: Internal Medicine

## 2012-08-15 ENCOUNTER — Ambulatory Visit (INDEPENDENT_AMBULATORY_CARE_PROVIDER_SITE_OTHER): Payer: Medicare Other | Admitting: Cardiology

## 2012-08-15 ENCOUNTER — Encounter: Payer: Self-pay | Admitting: Cardiology

## 2012-08-15 VITALS — BP 118/72 | HR 77 | Ht 70.5 in | Wt 260.0 lb

## 2012-08-15 DIAGNOSIS — E781 Pure hyperglyceridemia: Secondary | ICD-10-CM

## 2012-08-15 DIAGNOSIS — R9430 Abnormal result of cardiovascular function study, unspecified: Secondary | ICD-10-CM

## 2012-08-15 DIAGNOSIS — R0602 Shortness of breath: Secondary | ICD-10-CM

## 2012-08-15 DIAGNOSIS — R197 Diarrhea, unspecified: Secondary | ICD-10-CM

## 2012-08-15 DIAGNOSIS — R0989 Other specified symptoms and signs involving the circulatory and respiratory systems: Secondary | ICD-10-CM

## 2012-08-15 DIAGNOSIS — Z01810 Encounter for preprocedural cardiovascular examination: Secondary | ICD-10-CM

## 2012-08-15 DIAGNOSIS — IMO0002 Reserved for concepts with insufficient information to code with codable children: Secondary | ICD-10-CM

## 2012-08-15 DIAGNOSIS — I2510 Atherosclerotic heart disease of native coronary artery without angina pectoris: Secondary | ICD-10-CM

## 2012-08-15 DIAGNOSIS — M48061 Spinal stenosis, lumbar region without neurogenic claudication: Secondary | ICD-10-CM

## 2012-08-15 DIAGNOSIS — I10 Essential (primary) hypertension: Secondary | ICD-10-CM

## 2012-08-15 MED ORDER — FENOFIBRATE 160 MG PO TABS: 160.0000 mg | ORAL_TABLET | Freq: Every day | ORAL | Status: DC

## 2012-08-15 NOTE — Assessment & Plan Note (Signed)
Patient has had a followup MRI. He's having significant symptoms. I've spoken with his primary physician. We all agree that he needs neurosurgical consultation. Arrangements will be made with Dr.Ellsner.

## 2012-08-15 NOTE — Assessment & Plan Note (Signed)
Coronary disease is stable. I have researched the old records and once again found his cath report from October, 2011. His his stent was patent. He had only nonobstructive other coronary disease. At this point his cardiac status is stable. I am recommending no further cardiac workup.

## 2012-08-15 NOTE — Assessment & Plan Note (Signed)
Blood pressure is controlled. No change in therapy. 

## 2012-08-15 NOTE — Assessment & Plan Note (Signed)
There have been adjustments to his medications and his diarrhea is decreasing.

## 2012-08-15 NOTE — Progress Notes (Signed)
Patient ID: William Hendrix, male   DOB: [birth year], 71 y.o.   MRN: [id number]   HPI  Patient is seen today to followup coronary disease in for preop clearance for possible neurosurgery to the back. He has known coronary disease. He has been stable. His last catheterization was done in October, 2011. At that time he did not have any significant restenosis. More recently he has had marked discomfort from his lower back problems. He's had followup MRI and is awaiting the next steps on what might be done. He's very uncomfortable.  The patient also recently had diarrhea. Some of his medicines were adjusted and this helped somewhat. Etiology remains unclear.  I called and spoke today with Dr.Norins, the patient's primary physician. A letter had been sent that will be arriving at his home today or tomorrow. It was noted that his hemoglobin A1c was slightly elevated but did not require a change in his medicines. It was noted that he had significant elevation of his triglycerides.  Allergies  Allergen Reactions  . Codeine     Current Outpatient Prescriptions  Medication Sig Dispense Refill  . acetaminophen (TYLENOL) 500 MG tablet Take 500 mg by mouth 2 (two) times daily.        Marland Kitchen allopurinol (ZYLOPRIM) 300 MG tablet Take 1 tablet (300 mg total) by mouth daily.  90 tablet  3  . aspirin 81 MG tablet Take 1 tablet (81 mg total) by mouth daily.      Marland Kitchen atenolol (TENORMIN) 50 MG tablet Take 1 tablet (50 mg total) by mouth daily.  90 tablet  2  . Cholecalciferol (VITAMIN D) 1000 UNITS capsule Take 1,000 Units by mouth daily.        . fenofibrate 54 MG tablet Take 1 tablet (54 mg total) by mouth 2 (two) times daily.  180 tablet  2  . fluocinonide cream (LIDEX) 0.05 %       . FREESTYLE LITE test strip TEST BLOOD SUGAR TWICE DAILY AS DIRECTED  180 each  3  . furosemide (LASIX) 80 MG tablet Take 1 tablet (80 mg total) by mouth daily.  90 tablet  2  . glimepiride (AMARYL) 2 MG tablet Take 1 tablet (2 mg total)  by mouth daily before breakfast.  90 tablet  3  . Lancets (FREESTYLE) lancets TEST TWICE DAILY AS DIRECTED BY PHYSICIAN  180 each  3  . nitroGLYCERIN (NITROSTAT) 0.4 MG SL tablet Place 1 tablet (0.4 mg total) under the tongue every 5 (five) minutes as needed.  25 tablet  2  . Omega-3 Fatty Acids (FISH OIL) 1000 MG CAPS 2 tabs po qd       . pantoprazole (PROTONIX) 40 MG tablet Take 40 mg by mouth daily.          History   Social History  . Marital Status: Married    Spouse Name: N/A    Number of Children: N/A  . Years of Education: N/A   Occupational History  . [employer]    Social History Main Topics  . Smoking status: Former Smoker    Quit date: 09/17/1996  . Smokeless tobacco: Not on file  . Alcohol Use: No  . Drug Use: Not on file  . Sexually Active: Not on file   Other Topics Concern  . Not on file   Social History Narrative   Married    Family History  Problem Relation Age of Onset  . Prostate cancer    . Kidney cancer    .  Cancer      Bladder cancer  . Coronary artery disease      Past Medical History  Diagnosis Date  . Obesity   . Edema   . DM (diabetes mellitus)   . Renal insufficiency      CKD... creatinine up to 1.5 in hospital after dye in October, 2011  . Fatigue   . Nephrolithiasis   . Gout   . Depression   . Skin cancer   . CAD (coronary artery disease)     stent 2000 /  cath,stent patent 2006 / cath 2007,stent probably patenet ,but difficult to assess / cath 07/2010, patent LAD stent with mild instent restenosis, moderagte elevation LVEDP  . Sleep apnea     CPAP,  [name]  . Ventral hernia   . Sinusitis   . Back problem   . Hyperlipidemia   . HTN (hypertension)   . OSA (obstructive sleep apnea)      CPAP.. Dr. Craige Cotta  . Chest pain     See data hospital 07/2010  . Drug therapy     Ntg intolerance  . Positive D dimer     significant elevation ,hospital 07/2010, etiology unclear  . Ejection fraction     55%, 07/2010, mild inferior hypo    . SOB (shortness of breath)     mild restrictive defect on PFTs...likely due to obesity and some volume overload  . Dizziness     Mild positional dizziness May, 2012  . History of pneumonia   . Melanoma     Melanoma removed from the left forearm with wide excision May, 2012    Past Surgical History  Procedure Date  . Nose surgery   . Cardiac catheterization   . Coronary stent placement 2000    CAD    Patient Active Problem List  Diagnosis  . DIABETES MELLITUS, TYPE II  . GOUT  . OVERWEIGHT/OBESITY  . DEPRESSION  . SINUSITIS  . PNEUMONIA DUE TO OTHER SPECIFIED ORGANISM  . VENTRAL HERNIA  . HIATAL HERNIA  . FATIGUE  . PARESTHESIA  . EDEMA  . OTHER ABNORMAL BLOOD CHEMISTRY  . SKIN CANCER, HX OF  . PNEUMONIA, HX OF  . NEPHROLITHIASIS, HX OF  . Renal insufficiency  . CAD (coronary artery disease)  . Sleep apnea  . Hyperlipidemia  . HTN (hypertension)  . OSA (obstructive sleep apnea)  . Chest pain  . Drug therapy  . Positive D dimer  . Ejection fraction  . SOB (shortness of breath)  . Dizziness  . Melanoma  . Spinal stenosis of lumbar region at multiple levels    ROS   Patient denies fever, chills, headache, sweats, rash, change in vision, change in hearing, chest pain, cough,   PHYSICAL EXAM  Patient is here and he is uncomfortable with low back pain. He's here with his wife. There is no jugular venous distention. Lungs are clear. Respiratory effort is nonlabored. He is overweight. Cardiac exam reveals S1 and S2. There no clicks or significant murmurs. The abdomen is soft. There is no peripheral edema.  Filed Vitals:   08/15/12 1509  BP: 118/72  Pulse: 77  Height: 5' 10.5" (1.791 m)  Weight: 260 lb (117.935 kg)  SpO2: 95%   EKG is done today and reviewed by me. There is sinus rhythm. There are nonspecific ST-T wave changes. There is no significant change.  ASSESSMENT & PLAN

## 2012-08-15 NOTE — Assessment & Plan Note (Signed)
He is not having any significant shortness of breath at this time. 

## 2012-08-15 NOTE — Assessment & Plan Note (Signed)
Preop cardiac clearance for possible lumbar spine surgery, November, 2013. The patient is very uncomfortable with his current back pain. His cardiac status is stable. I cannot fully measure his mobility because of his back pain. We know from 2011 that his stent was patent and that he had other minor nonobstructive disease. I feel that further cardiac testing is not needed at this time. He has good left trigger function. He can be cleared for back surgery.

## 2012-08-15 NOTE — Patient Instructions (Addendum)
Your physician has recommended you make the following change in your medication: INCREASE your fenofibrate to  2 tabs (108mg ) in the am and 1 tab (54mg  ) in the pm until you run out and then switch to 160mg  once daily  Your physician recommends that you schedule a follow-up appointment in: 3 months.   You have been referred to Dr Ilean Skill ([address] [place], [place] [address] Phone: [phone]).  You will be contacted with an appt date and time.

## 2012-08-15 NOTE — Assessment & Plan Note (Signed)
The patient has been on fenofibrate 54 mg twice daily. I will increase this to a total of 3 tablets a day. When this runs out he will begin a new 160 mg dose of fenofibrate.

## 2012-08-17 ENCOUNTER — Telehealth: Payer: Self-pay | Admitting: Cardiology

## 2012-08-17 NOTE — Telephone Encounter (Signed)
New problem:   Dr. Danielle Dess -  [facility] is waiting for his chart to make an appt. Office # [phone].

## 2012-08-17 NOTE — Telephone Encounter (Signed)
Records were refaxed to [facility] and she was notified that they are coming.

## 2012-08-23 ENCOUNTER — Observation Stay (HOSPITAL_COMMUNITY)
Admission: EM | Admit: 2012-08-23 | Discharge: 2012-08-24 | Disposition: A | Discharge status: Home / Self Care | Payer: Medicare Other | Attending: Emergency Medicine | Admitting: Emergency Medicine

## 2012-08-23 ENCOUNTER — Encounter (HOSPITAL_COMMUNITY): Payer: Self-pay | Admitting: Emergency Medicine

## 2012-08-23 ENCOUNTER — Telehealth: Payer: Self-pay | Admitting: Internal Medicine

## 2012-08-23 ENCOUNTER — Observation Stay (HOSPITAL_COMMUNITY): Payer: Medicare Other

## 2012-08-23 DIAGNOSIS — I10 Essential (primary) hypertension: Secondary | ICD-10-CM | POA: Insufficient documentation

## 2012-08-23 DIAGNOSIS — Z85820 Personal history of malignant melanoma of skin: Secondary | ICD-10-CM | POA: Insufficient documentation

## 2012-08-23 DIAGNOSIS — M4800 Spinal stenosis, site unspecified: Secondary | ICD-10-CM

## 2012-08-23 DIAGNOSIS — Z87442 Personal history of urinary calculi: Secondary | ICD-10-CM | POA: Insufficient documentation

## 2012-08-23 DIAGNOSIS — M109 Gout, unspecified: Secondary | ICD-10-CM | POA: Insufficient documentation

## 2012-08-23 DIAGNOSIS — F329 Major depressive disorder, single episode, unspecified: Secondary | ICD-10-CM | POA: Insufficient documentation

## 2012-08-23 DIAGNOSIS — E119 Type 2 diabetes mellitus without complications: Secondary | ICD-10-CM | POA: Insufficient documentation

## 2012-08-23 DIAGNOSIS — Z79899 Other long term (current) drug therapy: Secondary | ICD-10-CM | POA: Insufficient documentation

## 2012-08-23 DIAGNOSIS — G8929 Other chronic pain: Secondary | ICD-10-CM | POA: Insufficient documentation

## 2012-08-23 DIAGNOSIS — Z8709 Personal history of other diseases of the respiratory system: Secondary | ICD-10-CM | POA: Insufficient documentation

## 2012-08-23 DIAGNOSIS — Z7982 Long term (current) use of aspirin: Secondary | ICD-10-CM | POA: Insufficient documentation

## 2012-08-23 DIAGNOSIS — IMO0002 Reserved for concepts with insufficient information to code with codable children: Principal | ICD-10-CM | POA: Insufficient documentation

## 2012-08-23 DIAGNOSIS — Z87448 Personal history of other diseases of urinary system: Secondary | ICD-10-CM | POA: Insufficient documentation

## 2012-08-23 DIAGNOSIS — Z87891 Personal history of nicotine dependence: Secondary | ICD-10-CM | POA: Insufficient documentation

## 2012-08-23 DIAGNOSIS — G4733 Obstructive sleep apnea (adult) (pediatric): Secondary | ICD-10-CM | POA: Insufficient documentation

## 2012-08-23 DIAGNOSIS — Z8701 Personal history of pneumonia (recurrent): Secondary | ICD-10-CM | POA: Insufficient documentation

## 2012-08-23 DIAGNOSIS — M545 Low back pain: Secondary | ICD-10-CM

## 2012-08-23 DIAGNOSIS — E669 Obesity, unspecified: Secondary | ICD-10-CM | POA: Insufficient documentation

## 2012-08-23 DIAGNOSIS — M48061 Spinal stenosis, lumbar region without neurogenic claudication: Secondary | ICD-10-CM | POA: Insufficient documentation

## 2012-08-23 DIAGNOSIS — F3289 Other specified depressive episodes: Secondary | ICD-10-CM | POA: Insufficient documentation

## 2012-08-23 DIAGNOSIS — E785 Hyperlipidemia, unspecified: Secondary | ICD-10-CM | POA: Insufficient documentation

## 2012-08-23 LAB — CBC
HCT: 43.1 % (ref 39.0–52.0)
Platelets: 161 10*3/uL (ref 150–400)
RDW: 13.2 % (ref 11.5–15.5)
WBC: 7.1 10*3/uL (ref 4.0–10.5)

## 2012-08-23 LAB — BASIC METABOLIC PANEL
Chloride: 99 mEq/L (ref 96–112)
GFR calc Af Amer: 59 mL/min — ABNORMAL LOW (ref >90)
Potassium: 3.9 mEq/L (ref 3.5–5.1)
Sodium: 137 mEq/L (ref 135–145)

## 2012-08-23 MED ORDER — ONDANSETRON HCL 4 MG/2ML IJ SOLN
4.0000 mg | Freq: Four times a day (QID) | INTRAMUSCULAR | Status: DC | PRN
  Administered 2012-08-23 – 2012-08-24 (×2): 4 mg via INTRAVENOUS
  Filled 2012-08-23 (×2): qty 2

## 2012-08-23 MED ORDER — ATENOLOL 50 MG PO TABS
50.0000 mg | ORAL_TABLET | Freq: Every day | ORAL | Status: DC
  Administered 2012-08-24: 50 mg via ORAL
  Filled 2012-08-23: qty 1

## 2012-08-23 MED ORDER — OXYCODONE-ACETAMINOPHEN 5-325 MG PO TABS
1.0000 | ORAL_TABLET | Freq: Four times a day (QID) | ORAL | Status: DC | PRN
  Administered 2012-08-23 – 2012-08-24 (×2): 2 via ORAL
  Filled 2012-08-23 (×2): qty 2

## 2012-08-23 MED ORDER — NITROGLYCERIN 0.4 MG SL SUBL: 0.4000 mg | SUBLINGUAL_TABLET | SUBLINGUAL | Status: DC | PRN

## 2012-08-23 MED ORDER — FAMOTIDINE 20 MG PO TABS
10.0000 mg | ORAL_TABLET | Freq: Every day | ORAL | Status: DC
  Administered 2012-08-24: 10 mg via ORAL
  Filled 2012-08-23: qty 1

## 2012-08-23 MED ORDER — ALLOPURINOL 300 MG PO TABS
300.0000 mg | ORAL_TABLET | Freq: Every day | ORAL | Status: DC
  Administered 2012-08-24: 300 mg via ORAL
  Filled 2012-08-23 (×2): qty 1

## 2012-08-23 MED ORDER — GLIMEPIRIDE 2 MG PO TABS
2.0000 mg | ORAL_TABLET | Freq: Two times a day (BID) | ORAL | Status: DC
  Administered 2012-08-23 – 2012-08-24 (×2): 2 mg via ORAL
  Filled 2012-08-23 (×4): qty 1

## 2012-08-23 MED ORDER — ASPIRIN EC 81 MG PO TBEC
81.0000 mg | DELAYED_RELEASE_TABLET | Freq: Every day | ORAL | Status: DC
  Administered 2012-08-24: 81 mg via ORAL
  Filled 2012-08-23 (×2): qty 1

## 2012-08-23 MED ORDER — DIAZEPAM 2 MG PO TABS
2.0000 mg | ORAL_TABLET | Freq: Four times a day (QID) | ORAL | Status: DC | PRN
  Administered 2012-08-24 (×2): 2 mg via ORAL
  Filled 2012-08-23 (×2): qty 1

## 2012-08-23 MED ORDER — FUROSEMIDE 20 MG PO TABS
80.0000 mg | ORAL_TABLET | Freq: Every day | ORAL | Status: DC
Start: 2012-08-24 — End: 2012-08-24
  Administered 2012-08-24: 80 mg via ORAL
  Filled 2012-08-23: qty 4

## 2012-08-23 MED ORDER — FENOFIBRATE 160 MG PO TABS
160.0000 mg | ORAL_TABLET | Freq: Two times a day (BID) | ORAL | Status: DC
  Administered 2012-08-23 – 2012-08-24 (×2): 160 mg via ORAL
  Filled 2012-08-23 (×4): qty 1

## 2012-08-23 MED ORDER — HYDROMORPHONE HCL PF 1 MG/ML IJ SOLN
0.5000 mg | Freq: Four times a day (QID) | INTRAMUSCULAR | Status: DC | PRN
  Administered 2012-08-23 – 2012-08-24 (×2): 0.5 mg via INTRAVENOUS
  Filled 2012-08-23 (×2): qty 1

## 2012-08-23 MED ORDER — VITAMIN D 1000 UNITS PO CAPS
1000.0000 [IU] | ORAL_CAPSULE | Freq: Every day | ORAL | Status: DC
  Administered 2012-08-23: 1000 [IU] via ORAL
  Filled 2012-08-23 (×3): qty 1

## 2012-08-23 NOTE — ED Notes (Signed)
Pt c/o lower back pain since having MRI on October 31st; pt sts increased pain but denies new injury; pt sts pain in lower back

## 2012-08-23 NOTE — ED Notes (Signed)
Patient had MRI and is to follow up with neurosurgery.  Wife states "can't take him home like this".  Wife requesting Neurosurgery consult today.  Explained ER MD would see patient.

## 2012-08-23 NOTE — ED Notes (Signed)
MD at bedside. 

## 2012-08-23 NOTE — ED Provider Notes (Signed)
Patient in CDU under back pain protocol.  Patient with progressively worsening lower back pain with occasional episodes of lower extremity weakness.  MRI completed on 08/08/12 with some worsening of spinal stenosis.  Patient has been referred to neurosurgery and has an appointment scheduled for 09/15/12.  Patient has not had any consistent pain management at home.  Patient awake, alert, with family at bedside.  Mild weakness noted in LE when patient attempts SLR, as well as with plantar and dorsiflexion.  Patient denies incontinence. Strong distal pulses palpated.  Lungs CTA bilaterally.  S1/S2, RRR. Abdomen soft, bowel sounds present.  Plan per Dr. Clarene Duke  is for patient to receive scheduled dosing of analgesics and muscle relaxants overnight with reassessment of response in AM.  If no significant improvement, or continued difficulty with ambulation, consult with neurosurgery recommended.  Jimmye Norman, NP 08/23/12 2350

## 2012-08-23 NOTE — ED Provider Notes (Signed)
History  This chart was scribed for Laray Anger, DO by Bennett Scrape, ED Scribe. This patient was seen in room TR08C/TR08C and the patient's care was started at 6:29 PM.   CSN: [id number]  Arrival date & time 08/23/12  1748   First MD Initiated Contact with Patient 08/23/12 1829      Chief Complaint  Patient presents with  . Back Pain     The history is provided by the patient. No language interpreter was used.   Pt was seen at 7:05 PM  William Hendrix is a 71 y.o. male who presents to the Emergency Department complaining of gradual onset and worsening of persistent acute flair of his chronic lower back "pain" for the past 1-2 months.  Pt describes the pain as "cramping" with associated left lateral thigh tingling and numbness. Pain worsens with palpation of the area and body position changes. Has been taking flexeril and tylenol with mild improvement in symptoms.  States the pain "grabs him" and he then has trouble ambulating secondary to pain. He reports that he has a neurology consult on December 3rd, 2013.  Denies incont/retention of bowel or bladder, no saddle anesthesia, no focal motor weakness, no tingling/numbness in extremities, no fevers, no injury, no abd pain.  The symptoms have been associated with no other complaints. The patient has a significant history of similar symptoms previously.    PCP is Dr. Myrtis Ser. Neuro: Dr. Jeannetta Nap Past Medical History  Diagnosis Date  . Obesity   . Edema   . DM (diabetes mellitus)   . Renal insufficiency      CKD... creatinine up to 1.5 in hospital after dye in October, 2011  . Fatigue   . Nephrolithiasis   . Gout   . Depression   . Skin cancer   . CAD (coronary artery disease)     stent 2000 /  cath,stent patent 2006 / cath 2007,stent probably patenet ,but difficult to assess / cath 07/2010, patent LAD stent with mild instent restenosis, moderagte elevation LVEDP  . Sleep apnea     CPAP,  [name]  . Ventral hernia   .  Sinusitis   . Back problem   . Hyperlipidemia   . HTN (hypertension)   . OSA (obstructive sleep apnea)      CPAP.. Dr. Craige Cotta  . Chest pain     See data hospital 07/2010  . Drug therapy     Ntg intolerance  . Positive D dimer     significant elevation ,hospital 07/2010, etiology unclear  . Ejection fraction     55%, 07/2010, mild inferior hypo  . SOB (shortness of breath)     mild restrictive defect on PFTs...likely due to obesity and some volume overload  . Dizziness     Mild positional dizziness May, 2012  . History of pneumonia   . Melanoma     Melanoma removed from the left forearm with wide excision May, 2012    Past Surgical History  Procedure Date  . Nose surgery   . Cardiac catheterization   . Coronary stent placement 2000    CAD    Family History  Problem Relation Age of Onset  . Prostate cancer    . Kidney cancer    . Cancer      Bladder cancer  . Coronary artery disease      History  Substance Use Topics  . Smoking status: Former Smoker    Quit date: 09/17/1996  . Smokeless tobacco:  Not on file  . Alcohol Use: No      Review of Systems ROS: Statement: All systems negative except as marked or noted in the HPI; Constitutional: Negative for fever and chills. ; ; Eyes: Negative for eye pain, redness and discharge. ; ; ENMT: Negative for ear pain, hoarseness, nasal congestion, sinus pressure and sore throat. ; ; Cardiovascular: Negative for chest pain, palpitations, diaphoresis, dyspnea and peripheral edema. ; ; Respiratory: Negative for cough, wheezing and stridor. ; ; Gastrointestinal: Negative for nausea, vomiting, diarrhea, abdominal pain, blood in stool, hematemesis, jaundice and rectal bleeding. . ; ; Genitourinary: Negative for dysuria, flank pain and hematuria. ; ; Musculoskeletal: +LBP. Negative for neck pain. Negative for swelling and trauma.; ; Skin: Negative for pruritus, rash, abrasions, blisters, bruising and skin lesion.; ; Neuro: Negative for  headache, lightheadedness and neck stiffness. Negative for weakness, altered level of consciousness , altered mental status, extremity weakness, paresthesias, involuntary movement, seizure and syncope.       Allergies  Codeine-gives him nausea and makes him jittery   Home Medications   Current Outpatient Rx  Name  Route  Sig  Dispense  Refill  . ACETAMINOPHEN 500 MG PO TABS   Oral   Take 500 mg by mouth 2 (two) times daily.           . ALLOPURINOL 300 MG PO TABS   Oral   Take 1 tablet (300 mg total) by mouth daily.   90 tablet   3   . ASPIRIN EC 81 MG PO TBEC   Oral   Take 1 tablet (81 mg total) by mouth daily.         . ATENOLOL 50 MG PO TABS   Oral   Take 1 tablet (50 mg total) by mouth daily.   90 tablet   2   . VITAMIN D 1000 UNITS PO CAPS   Oral   Take 1,000 Units by mouth daily.           . FENOFIBRATE 160 MG PO TABS   Oral   Take 1 tablet (160 mg total) by mouth daily.   90 tablet   2   . FLUOCINONIDE 0.05 % EX CREA               . FREESTYLE LITE TEST VI STRP      TEST BLOOD SUGAR TWICE DAILY AS DIRECTED   180 each   3   . FUROSEMIDE 80 MG PO TABS   Oral   Take 1 tablet (80 mg total) by mouth daily.   90 tablet   2   . GLIMEPIRIDE 2 MG PO TABS   Oral   Take 1 tablet (2 mg total) by mouth daily before breakfast.   90 tablet   3   . FREESTYLE LANCETS MISC      TEST TWICE DAILY AS DIRECTED BY PHYSICIAN   180 each   3   . NITROGLYCERIN 0.4 MG SL SUBL   Sublingual   Place 1 tablet (0.4 mg total) under the tongue every 5 (five) minutes as needed.   25 tablet   2   . FISH OIL 1000 MG PO CAPS      2 tabs po qd          . PANTOPRAZOLE SODIUM 40 MG PO TBEC   Oral   Take 40 mg by mouth daily.             Triage Vitals: BP 126/64  Pulse 78  Temp 97.3 F (36.3 C) (Oral)  Resp 24  SpO2 98%  Physical Exam 1910: Physical examination:  Nursing notes reviewed; Vital signs and O2 SAT reviewed;  Constitutional: Well  developed, Well nourished, Well hydrated, In no acute distress; Head:  Normocephalic, atraumatic; Eyes: EOMI, PERRL, No scleral icterus; ENMT: Mouth and pharynx normal, Mucous membranes moist; Neck: Supple, Full range of motion, No lymphadenopathy; Cardiovascular: Regular rate and rhythm, No murmur, rub, or gallop; Respiratory: Breath sounds clear & equal bilaterally, No rales, rhonchi, wheezes.  Speaking full sentences with ease, Normal respiratory effort/excursion; Chest: Nontender, Movement normal; Abdomen: Soft, Nontender, Nondistended, Normal bowel sounds; Genitourinary: No CVA tenderness; Spine:  No midline CS, TS, LS tenderness.  +TTP right lumbar paraspinal muscles.; Extremities: Pulses normal, No tenderness, No edema, No calf edema or asymmetry.; Neuro: AA&Ox3, Major CN grossly intact.  Speech clear. Strength 5/5 equal bilat UE's and LE's, including great toe dorsiflexion.  DTR 2/4 equal bilat UE's and LE's.  No gross sensory deficits.  Neg straight leg raises bilat.;; Skin: Color normal, Warm, Dry.   ED Course  Procedures   DIAGNOSTIC STUDIES: Oxygen Saturation is 98% on room air, normal by my interpretation.    COORDINATION OF CARE: 7:11 PM:  Discussed treatment plan which includes pain management and 24 hour observation with pt at bedside and pt agreed to plan.   1925:  No new gross focal neuro deficits on exam today. Will place on CDU LBP observation protocol  T/C to NP [facility] in CDU, case discussed, will assume care of pt in CDU.     MDM  MDM Reviewed: previous chart, nursing note and vitals Reviewed previous: MRI Interpretation: labs and x-ray   Mr Lumbar Spine Wo Contrast 08/11/2012  *RADIOLOGY REPORT*  Clinical Data: Low back pain with left leg pain  MRI LUMBAR SPINE WITHOUT CONTRAST  Technique:  Multiplanar and multiecho pulse sequences of the lumbar spine were obtained without intravenous contrast.  Comparison: MRI 08/31/2005  Findings: Improvement in epidural lipomatosis  since the prior study due to weight loss.  2 mm anterior slip L4-5.  Negative for fracture or mass lesion.  Conus medullaris is normal and terminates at L1.  L1-2:  Mild disc bulging and mild facet degeneration.  L2-3:  Disc bulging and facet hypertrophy.  Mild  to moderate spinal stenosis with slight progression from the  prior study.  No focal disc protrusion.  L3-4:  Improvement in central disc protrusion.  There remains disc bulging and spurring and moderate facet hypertrophy.  There is severe spinal stenosis which is unchanged.  L4-5:  Grade 1 anterior slip.  Mild disc bulging and spurring. Advanced facet hypertrophy.  There is moderate to severe spinal stenosis which is unchanged.  L5-S1:  Disc and facet degeneration without significant spinal stenosis.  IMPRESSION: Improvement in epidural lipomatosis due to interval weight loss.  Mild to moderate spinal stenosis L2-3, slightly progressive.  Severe spinal stenosis at L3-4 is similar to the prior study. There is slight improvement in the central disc protrusion.  Moderate to severe spinal stenosis at L4-5 is unchanged.   Original Report Authenticated By: Janeece Riggers, M.D.        I personally performed the services described in this documentation, which was scribed in my presence. The recorded information has been reviewed and considered. Challen Spainhour M          New note, previous note auto-imported.  Mr. Sherald Barge is a 71 year old male with back pain. He is on  back pain protocol. He has been signed out to me by Dr. Bernette Mayers.  8:08 AM Patient states that his pain has improved from 6/10 to 3/10. He complains that it is still bothering him though. He believes that something is seriously wrong, and would like further workup. I have reassured the patient that no immediate workup is required at this time, and that any further workup will need to be done on an outpatient basis.  Patient denies headache, chest pain, shortness of breath, nausea,  vomiting, diarrhea, constipation, dysuria, peripheral edema.  Endorses pain in the back which radiates to his legs.  PE: Gen: A&O x4 HEENT: PERRL, EOM CHEST: RRR, no m/r/g LUNGS: CTAB, no w/r/r ABD: BS x 4, ND/NT EXT: No edema, strong peripheral pulses, lumbar back pain with symptoms radiating to the legs. NEURO: Sensation and strength intact bilaterally   Plan: Control pain and discharged to home with primary care followup  11:32 AM I have spoken with Dr. Verlee Rossetti office, they're going to review the patient's case and see if they can work him in earlier than December 5. They will notify him if this is the case. I'm going to discharge the patient with pain medicine and muscle relaxer, and have him followup with his PCP if he needs more medications until he is able to see Dr. Danielle Dess. Patient is agreeable with this plan. Patient also states that he will begin to use a walker, which she has a home. Patient is able to ambulate, though he reports it is painful.       Roxy Horseman, PA-C 08/24/12 1134  Laray Anger, DO 08/24/12 1238

## 2012-08-23 NOTE — Telephone Encounter (Signed)
Wife, Elease Hashimoto calling.  He has worsening back pain.  She is getting him dressed now and taking him to the ED.   He "has almost fallen twice today" and she is afraid that he will fall and get hurt worse then he is.  Dr. Christain Sacramento cannot see him until 12/5.   She will take him to the ED by car.

## 2012-08-23 NOTE — ED Notes (Signed)
Patient transported to X-ray 

## 2012-08-24 MED ORDER — HYDROMORPHONE HCL PF 1 MG/ML IJ SOLN
1.0000 mg | Freq: Once | INTRAMUSCULAR | Status: AC
  Administered 2012-08-24: 1 mg via INTRAVENOUS

## 2012-08-24 MED ORDER — HYDROMORPHONE HCL PF 1 MG/ML IJ SOLN
1.0000 mg | Freq: Once | INTRAMUSCULAR | Status: AC
  Administered 2012-08-24: 1 mg via INTRAVENOUS
  Filled 2012-08-24: qty 1

## 2012-08-24 MED ORDER — OXYCODONE-ACETAMINOPHEN 5-325 MG PO TABS: 2.0000 | ORAL_TABLET | ORAL | Status: DC | PRN

## 2012-08-24 MED ORDER — DIAZEPAM 5 MG PO TABS: 5.0000 mg | ORAL_TABLET | Freq: Two times a day (BID) | ORAL | Status: DC

## 2012-08-24 NOTE — ED Provider Notes (Signed)
Medical screening examination/treatment/procedure(s) were performed by non-physician practitioner and as supervising physician I was immediately available for consultation/collaboration.  Abhishek Levesque K Linker, MD 08/24/12 0014 

## 2012-08-24 NOTE — ED Notes (Signed)
BREAKFAST ORDERED  

## 2012-08-24 NOTE — ED Notes (Signed)
Pt ambulated to the bathroom but complained that "my back feels like it is going to give out." NT advised pt to use a urinal instead of continuing to get up and go to the bathroom but patient refused. RN notified and urinal placed at bedside, pt reminded that he should use the urinal.

## 2012-08-24 NOTE — ED Notes (Signed)
PT AMBULATORY TO RESTROOM.

## 2012-08-24 NOTE — ED Notes (Signed)
Pt A.O. X 4. Resting with CPAP in place (using device from home). Updated on plan of care: aware that next dose of pain medication is not due until after 0130. No further needs at this time.

## 2012-08-24 NOTE — Progress Notes (Signed)
Utilization review completed.  

## 2012-08-24 NOTE — ED Notes (Signed)
PA IN TO DISCUSS PLAN WITH PT AND FAMILY

## 2012-08-26 ENCOUNTER — Other Ambulatory Visit: Payer: Self-pay | Admitting: Neurological Surgery

## 2012-08-29 ENCOUNTER — Encounter (HOSPITAL_COMMUNITY): Payer: Self-pay

## 2012-08-29 ENCOUNTER — Encounter (HOSPITAL_COMMUNITY)
Admission: RE | Admit: 2012-08-29 | Discharge: 2012-08-29 | Disposition: A | Discharge status: Home / Self Care | Payer: Medicare Other | Source: Ambulatory Visit | Attending: Anesthesiology | Admitting: Anesthesiology

## 2012-08-29 ENCOUNTER — Encounter (HOSPITAL_COMMUNITY): Payer: Self-pay | Admitting: Respiratory Therapy

## 2012-08-29 ENCOUNTER — Encounter (HOSPITAL_COMMUNITY): Payer: Self-pay | Admitting: Pharmacy Technician

## 2012-08-29 ENCOUNTER — Encounter (HOSPITAL_COMMUNITY)
Admission: RE | Admit: 2012-08-29 | Discharge: 2012-08-29 | Disposition: A | Discharge status: Home / Self Care | Payer: Medicare Other | Source: Ambulatory Visit | Attending: Neurological Surgery | Admitting: Neurological Surgery

## 2012-08-29 HISTORY — DX: Gastro-esophageal reflux disease without esophagitis: K21.9

## 2012-08-29 HISTORY — DX: Unspecified osteoarthritis, unspecified site: M19.90

## 2012-08-29 HISTORY — DX: Personal history of other diseases of the digestive system: Z87.19

## 2012-08-29 LAB — SURGICAL PCR SCREEN
MRSA, PCR: NEGATIVE
Staphylococcus aureus: NEGATIVE

## 2012-08-29 LAB — CBC
MCH: 31.9 pg (ref 26.0–34.0)
MCHC: 34.8 g/dL (ref 30.0–36.0)
Platelets: 171 10*3/uL (ref 150–400)
RBC: 4.55 MIL/uL (ref 4.22–5.81)
RDW: 13.4 % (ref 11.5–15.5)

## 2012-08-29 LAB — BASIC METABOLIC PANEL
Calcium: 10 mg/dL (ref 8.4–10.5)
Creatinine, Ser: 1.32 mg/dL (ref 0.50–1.35)
GFR calc non Af Amer: 52 mL/min — ABNORMAL LOW (ref >90)
Glucose, Bld: 327 mg/dL — ABNORMAL HIGH (ref 70–99)
Sodium: 135 mEq/L (ref 135–145)

## 2012-08-29 MED ORDER — CEFAZOLIN SODIUM-DEXTROSE 2-3 GM-% IV SOLR
2.0000 g | INTRAVENOUS | Status: AC
  Administered 2012-08-30: 2 g via INTRAVENOUS
  Filled 2012-08-29: qty 50

## 2012-08-29 NOTE — Pre-Procedure Instructions (Addendum)
20 William Hendrix  08/29/2012   Your procedure is scheduled on:  08/30/12  Report to [facility] at 815 AM.  Call this number if you have problems the morning of surgery: [phone]   Remember:   Do not eat food or drinkAfter Midnight.    Take these medicines the morning of surgery with A SIP OF WATER:allopurinol, atenolol, if needed, pain med,  STOP aspirin  No diabetic med am of surgery   Do not wear jewelry,   Do not wear lotions, powders, or perfumes. .  Do not shave 48 hours prior to surgery. Men may shave face and neck.  Do not bring valuables to the hospital.  Contacts, dentures or bridgework may not be worn into surgery.  Leave suitcase in the car. After surgery it may be brought to your room.  For patients admitted to the hospital, checkout time is 11:00 AM the day of discharge.   Patients discharged the day of surgery will not be allowed to drive home.  Name and phone number of your driver:patricia wife [phone]  Special Instructions: Shower using CHG 2 nights before surgery and the night before surgery.  If you shower the day of surgery use CHG.  Use special wash - you have one bottle of CHG for all showers.  You should use approximately 1/3 of the bottle for each shower.   Please read over the following fact sheets that you were given: Pain Booklet, Coughing and Deep Breathing, MRSA Information and Surgical Site Infection Prevention

## 2012-08-29 NOTE — Progress Notes (Signed)
Note from dr Myrtis Ser 11/13, cath 2011,stress 10/01. Echo 10/11 ekg 11/13

## 2012-08-30 ENCOUNTER — Inpatient Hospital Stay (HOSPITAL_COMMUNITY)
Admission: RE | Admit: 2012-08-30 | Discharge: 2012-09-02 | DRG: 491 | Disposition: A | Discharge status: Home / Self Care | Payer: Medicare Other | Source: Ambulatory Visit | Attending: Neurological Surgery | Admitting: Neurological Surgery

## 2012-08-30 ENCOUNTER — Ambulatory Visit (HOSPITAL_COMMUNITY): Payer: Medicare Other

## 2012-08-30 ENCOUNTER — Encounter (HOSPITAL_COMMUNITY)
Admission: RE | Disposition: A | Discharge status: Home / Self Care | Payer: Self-pay | Source: Ambulatory Visit | Attending: Neurological Surgery

## 2012-08-30 ENCOUNTER — Encounter (HOSPITAL_COMMUNITY): Payer: Self-pay | Admitting: Surgery

## 2012-08-30 ENCOUNTER — Encounter (HOSPITAL_COMMUNITY): Payer: Self-pay | Admitting: Certified Registered Nurse Anesthetist

## 2012-08-30 ENCOUNTER — Ambulatory Visit (HOSPITAL_COMMUNITY): Payer: Medicare Other | Admitting: Certified Registered Nurse Anesthetist

## 2012-08-30 DIAGNOSIS — Z79899 Other long term (current) drug therapy: Secondary | ICD-10-CM

## 2012-08-30 DIAGNOSIS — M129 Arthropathy, unspecified: Secondary | ICD-10-CM | POA: Diagnosis present

## 2012-08-30 DIAGNOSIS — N189 Chronic kidney disease, unspecified: Secondary | ICD-10-CM | POA: Diagnosis present

## 2012-08-30 DIAGNOSIS — D1779 Benign lipomatous neoplasm of other sites: Secondary | ICD-10-CM | POA: Diagnosis present

## 2012-08-30 DIAGNOSIS — R339 Retention of urine, unspecified: Secondary | ICD-10-CM | POA: Diagnosis not present

## 2012-08-30 DIAGNOSIS — Z885 Allergy status to narcotic agent status: Secondary | ICD-10-CM

## 2012-08-30 DIAGNOSIS — Z85828 Personal history of other malignant neoplasm of skin: Secondary | ICD-10-CM

## 2012-08-30 DIAGNOSIS — E119 Type 2 diabetes mellitus without complications: Secondary | ICD-10-CM | POA: Diagnosis present

## 2012-08-30 DIAGNOSIS — E785 Hyperlipidemia, unspecified: Secondary | ICD-10-CM | POA: Diagnosis present

## 2012-08-30 DIAGNOSIS — I2510 Atherosclerotic heart disease of native coronary artery without angina pectoris: Secondary | ICD-10-CM | POA: Diagnosis present

## 2012-08-30 DIAGNOSIS — Z87442 Personal history of urinary calculi: Secondary | ICD-10-CM

## 2012-08-30 DIAGNOSIS — Z9861 Coronary angioplasty status: Secondary | ICD-10-CM

## 2012-08-30 DIAGNOSIS — K219 Gastro-esophageal reflux disease without esophagitis: Secondary | ICD-10-CM | POA: Diagnosis present

## 2012-08-30 DIAGNOSIS — M47817 Spondylosis without myelopathy or radiculopathy, lumbosacral region: Principal | ICD-10-CM | POA: Diagnosis present

## 2012-08-30 DIAGNOSIS — Z9889 Other specified postprocedural states: Secondary | ICD-10-CM

## 2012-08-30 DIAGNOSIS — G4733 Obstructive sleep apnea (adult) (pediatric): Secondary | ICD-10-CM | POA: Diagnosis present

## 2012-08-30 DIAGNOSIS — Z7982 Long term (current) use of aspirin: Secondary | ICD-10-CM

## 2012-08-30 DIAGNOSIS — M109 Gout, unspecified: Secondary | ICD-10-CM | POA: Diagnosis present

## 2012-08-30 DIAGNOSIS — E669 Obesity, unspecified: Secondary | ICD-10-CM | POA: Diagnosis present

## 2012-08-30 DIAGNOSIS — I129 Hypertensive chronic kidney disease with stage 1 through stage 4 chronic kidney disease, or unspecified chronic kidney disease: Secondary | ICD-10-CM | POA: Diagnosis present

## 2012-08-30 HISTORY — PX: LUMBAR LAMINECTOMY/DECOMPRESSION MICRODISCECTOMY: SHX5026

## 2012-08-30 LAB — GLUCOSE, CAPILLARY
Glucose-Capillary: 163 mg/dL — ABNORMAL HIGH (ref 70–99)
Glucose-Capillary: 173 mg/dL — ABNORMAL HIGH (ref 70–99)

## 2012-08-30 SURGERY — LUMBAR LAMINECTOMY/DECOMPRESSION MICRODISCECTOMY 2 LEVELS
Anesthesia: General | Site: Back | Laterality: Bilateral | Wound class: Clean

## 2012-08-30 MED ORDER — LACTATED RINGERS IV SOLN
INTRAVENOUS | Status: DC | PRN
  Administered 2012-08-30 (×3): via INTRAVENOUS

## 2012-08-30 MED ORDER — EPHEDRINE SULFATE 50 MG/ML IJ SOLN
INTRAMUSCULAR | Status: DC | PRN
  Administered 2012-08-30: 10 mg via INTRAVENOUS
  Administered 2012-08-30: 5 mg via INTRAVENOUS

## 2012-08-30 MED ORDER — ONDANSETRON HCL 4 MG/2ML IJ SOLN: 4.0000 mg | INTRAMUSCULAR | Status: DC | PRN

## 2012-08-30 MED ORDER — DIAZEPAM 5 MG PO TABS
ORAL_TABLET | ORAL | Status: AC
  Filled 2012-08-30: qty 1

## 2012-08-30 MED ORDER — CEFAZOLIN SODIUM 1-5 GM-% IV SOLN
1.0000 g | Freq: Three times a day (TID) | INTRAVENOUS | Status: AC
  Administered 2012-08-30 (×2): 1 g via INTRAVENOUS
  Filled 2012-08-30 (×2): qty 50

## 2012-08-30 MED ORDER — OXYCODONE-ACETAMINOPHEN 5-325 MG PO TABS
1.0000 | ORAL_TABLET | ORAL | Status: DC | PRN
  Administered 2012-08-30 – 2012-09-02 (×8): 2 via ORAL
  Filled 2012-08-30 (×8): qty 2

## 2012-08-30 MED ORDER — KETOROLAC TROMETHAMINE 15 MG/ML IJ SOLN
15.0000 mg | Freq: Four times a day (QID) | INTRAMUSCULAR | Status: AC
  Administered 2012-08-30 – 2012-08-31 (×4): 15 mg via INTRAVENOUS
  Filled 2012-08-30 (×8): qty 1

## 2012-08-30 MED ORDER — ACETAMINOPHEN 650 MG RE SUPP: 650.0000 mg | RECTAL | Status: DC | PRN

## 2012-08-30 MED ORDER — MORPHINE SULFATE 2 MG/ML IJ SOLN
1.0000 mg | INTRAMUSCULAR | Status: DC | PRN
  Administered 2012-08-31: 4 mg via INTRAVENOUS
  Filled 2012-08-30: qty 2

## 2012-08-30 MED ORDER — HEMOSTATIC AGENTS (NO CHARGE) OPTIME
TOPICAL | Status: DC | PRN
  Administered 2012-08-30: 1 via TOPICAL

## 2012-08-30 MED ORDER — GLYCOPYRROLATE 0.2 MG/ML IJ SOLN
INTRAMUSCULAR | Status: DC | PRN
  Administered 2012-08-30: .8 mg via INTRAVENOUS

## 2012-08-30 MED ORDER — BUPIVACAINE HCL (PF) 0.5 % IJ SOLN
INTRAMUSCULAR | Status: DC | PRN
  Administered 2012-08-30: 10 mL

## 2012-08-30 MED ORDER — GABAPENTIN 300 MG PO CAPS
300.0000 mg | ORAL_CAPSULE | Freq: Three times a day (TID) | ORAL | Status: DC
  Administered 2012-08-30 – 2012-09-02 (×9): 300 mg via ORAL
  Filled 2012-08-30 (×11): qty 1

## 2012-08-30 MED ORDER — PROMETHAZINE HCL 25 MG/ML IJ SOLN: 6.2500 mg | INTRAMUSCULAR | Status: DC | PRN

## 2012-08-30 MED ORDER — LIDOCAINE-EPINEPHRINE 1 %-1:100000 IJ SOLN
INTRAMUSCULAR | Status: DC | PRN
  Administered 2012-08-30: 10 mL

## 2012-08-30 MED ORDER — 0.9 % SODIUM CHLORIDE (POUR BTL) OPTIME
TOPICAL | Status: DC | PRN
  Administered 2012-08-30: 1000 mL

## 2012-08-30 MED ORDER — SODIUM CHLORIDE 0.9 % IJ SOLN: 3.0000 mL | INTRAMUSCULAR | Status: DC | PRN

## 2012-08-30 MED ORDER — ONDANSETRON HCL 4 MG/2ML IJ SOLN
INTRAMUSCULAR | Status: DC | PRN
  Administered 2012-08-30: 4 mg via INTRAVENOUS

## 2012-08-30 MED ORDER — PHENOL 1.4 % MT LIQD: 1.0000 | OROMUCOSAL | Status: DC | PRN

## 2012-08-30 MED ORDER — FENOFIBRATE 160 MG PO TABS
160.0000 mg | ORAL_TABLET | Freq: Two times a day (BID) | ORAL | Status: DC
  Administered 2012-08-30 – 2012-09-02 (×7): 160 mg via ORAL
  Filled 2012-08-30 (×8): qty 1

## 2012-08-30 MED ORDER — HYDROMORPHONE HCL PF 1 MG/ML IJ SOLN
0.2500 mg | INTRAMUSCULAR | Status: DC | PRN
  Administered 2012-08-30 (×3): 0.5 mg via INTRAVENOUS

## 2012-08-30 MED ORDER — DIAZEPAM 5 MG PO TABS
5.0000 mg | ORAL_TABLET | Freq: Four times a day (QID) | ORAL | Status: DC | PRN
  Administered 2012-08-30 – 2012-09-02 (×5): 5 mg via ORAL
  Filled 2012-08-30 (×4): qty 1

## 2012-08-30 MED ORDER — SODIUM CHLORIDE 0.9 % IV SOLN: 250.0000 mL | INTRAVENOUS | Status: DC

## 2012-08-30 MED ORDER — MENTHOL 3 MG MT LOZG: 1.0000 | LOZENGE | OROMUCOSAL | Status: DC | PRN

## 2012-08-30 MED ORDER — OXYCODONE HCL 5 MG/5ML PO SOLN: 5.0000 mg | Freq: Once | ORAL | Status: AC | PRN

## 2012-08-30 MED ORDER — PROPOFOL 10 MG/ML IV BOLUS
INTRAVENOUS | Status: DC | PRN
  Administered 2012-08-30: 100 mg via INTRAVENOUS

## 2012-08-30 MED ORDER — SODIUM CHLORIDE 0.9 % IJ SOLN
3.0000 mL | Freq: Two times a day (BID) | INTRAMUSCULAR | Status: DC
  Administered 2012-08-30 – 2012-09-02 (×6): 3 mL via INTRAVENOUS

## 2012-08-30 MED ORDER — OXYCODONE HCL 5 MG PO TABS
5.0000 mg | ORAL_TABLET | Freq: Once | ORAL | Status: AC | PRN
  Administered 2012-08-30: 5 mg via ORAL

## 2012-08-30 MED ORDER — OXYCODONE HCL 5 MG PO TABS
ORAL_TABLET | ORAL | Status: AC
  Administered 2012-08-30: 5 mg via ORAL
  Filled 2012-08-30: qty 1

## 2012-08-30 MED ORDER — LIDOCAINE HCL (CARDIAC) 20 MG/ML IV SOLN
INTRAVENOUS | Status: DC | PRN
  Administered 2012-08-30: 30 mg via INTRAVENOUS

## 2012-08-30 MED ORDER — HYDROMORPHONE HCL PF 1 MG/ML IJ SOLN
INTRAMUSCULAR | Status: AC
  Administered 2012-08-30: 0.5 mg via INTRAVENOUS
  Filled 2012-08-30: qty 1

## 2012-08-30 MED ORDER — NITROGLYCERIN 0.4 MG SL SUBL: 0.4000 mg | SUBLINGUAL_TABLET | SUBLINGUAL | Status: DC | PRN

## 2012-08-30 MED ORDER — HYDROMORPHONE HCL PF 1 MG/ML IJ SOLN
INTRAMUSCULAR | Status: AC
  Filled 2012-08-30: qty 1

## 2012-08-30 MED ORDER — MIDAZOLAM HCL 2 MG/2ML IJ SOLN: 0.5000 mg | Freq: Once | INTRAMUSCULAR | Status: DC | PRN

## 2012-08-30 MED ORDER — FUROSEMIDE 80 MG PO TABS
80.0000 mg | ORAL_TABLET | Freq: Every day | ORAL | Status: DC
  Administered 2012-08-30 – 2012-09-02 (×4): 80 mg via ORAL
  Filled 2012-08-30 (×4): qty 1

## 2012-08-30 MED ORDER — FAMOTIDINE 10 MG PO TABS
10.0000 mg | ORAL_TABLET | Freq: Two times a day (BID) | ORAL | Status: DC
  Administered 2012-08-30 – 2012-09-02 (×7): 10 mg via ORAL
  Filled 2012-08-30 (×8): qty 1

## 2012-08-30 MED ORDER — KETOROLAC TROMETHAMINE 30 MG/ML IJ SOLN
INTRAMUSCULAR | Status: AC
  Administered 2012-08-30: 15 mg
  Filled 2012-08-30: qty 1

## 2012-08-30 MED ORDER — SODIUM CHLORIDE 0.9 % IR SOLN
Status: DC | PRN
  Administered 2012-08-30: 07:00:00

## 2012-08-30 MED ORDER — OXYCODONE-ACETAMINOPHEN 5-325 MG PO TABS: 1.0000 | ORAL_TABLET | ORAL | Status: DC | PRN

## 2012-08-30 MED ORDER — ALUM & MAG HYDROXIDE-SIMETH 200-200-20 MG/5ML PO SUSP: 30.0000 mL | Freq: Four times a day (QID) | ORAL | Status: DC | PRN

## 2012-08-30 MED ORDER — GLIMEPIRIDE 2 MG PO TABS
2.0000 mg | ORAL_TABLET | Freq: Two times a day (BID) | ORAL | Status: DC
  Administered 2012-08-30 – 2012-09-02 (×6): 2 mg via ORAL
  Filled 2012-08-30 (×8): qty 1

## 2012-08-30 MED ORDER — ACETAMINOPHEN 325 MG PO TABS: 650.0000 mg | ORAL_TABLET | ORAL | Status: DC | PRN

## 2012-08-30 MED ORDER — THROMBIN 5000 UNITS EX SOLR
CUTANEOUS | Status: DC | PRN
  Administered 2012-08-30 (×2): 5000 [IU] via TOPICAL

## 2012-08-30 MED ORDER — MIDAZOLAM HCL 5 MG/5ML IJ SOLN
INTRAMUSCULAR | Status: DC | PRN
  Administered 2012-08-30: 2 mg via INTRAVENOUS

## 2012-08-30 MED ORDER — ROCURONIUM BROMIDE 100 MG/10ML IV SOLN
INTRAVENOUS | Status: DC | PRN
  Administered 2012-08-30: 50 mg via INTRAVENOUS
  Administered 2012-08-30 (×2): 10 mg via INTRAVENOUS

## 2012-08-30 MED ORDER — ALLOPURINOL 300 MG PO TABS
300.0000 mg | ORAL_TABLET | Freq: Every day | ORAL | Status: DC
  Administered 2012-08-31 – 2012-09-02 (×3): 300 mg via ORAL
  Filled 2012-08-30 (×4): qty 1

## 2012-08-30 MED ORDER — MEPERIDINE HCL 25 MG/ML IJ SOLN: 6.2500 mg | INTRAMUSCULAR | Status: DC | PRN

## 2012-08-30 MED ORDER — FENTANYL CITRATE 0.05 MG/ML IJ SOLN
INTRAMUSCULAR | Status: DC | PRN
  Administered 2012-08-30: 250 ug via INTRAVENOUS

## 2012-08-30 MED ORDER — NEOSTIGMINE METHYLSULFATE 1 MG/ML IJ SOLN
INTRAMUSCULAR | Status: DC | PRN
  Administered 2012-08-30: 5 mg via INTRAVENOUS

## 2012-08-30 MED ORDER — ATENOLOL 50 MG PO TABS
50.0000 mg | ORAL_TABLET | Freq: Every day | ORAL | Status: DC
Start: 2012-08-31 — End: 2012-09-02
  Administered 2012-08-31 – 2012-09-02 (×3): 50 mg via ORAL
  Filled 2012-08-30 (×3): qty 1

## 2012-08-30 SURGICAL SUPPLY — 51 items
ADH SKN CLS APL DERMABOND .7 (GAUZE/BANDAGES/DRESSINGS) ×1
BAG DECANTER FOR FLEXI CONT (MISCELLANEOUS) ×2 IMPLANT
BLADE SURG ROTATE 9660 (MISCELLANEOUS) IMPLANT
BUR ACORN 6.0 (BURR) ×2 IMPLANT
BUR MATCHSTICK NEURO 3.0 LAGG (BURR) ×2 IMPLANT
CANISTER SUCTION 2500CC (MISCELLANEOUS) ×2 IMPLANT
CLOTH BEACON ORANGE TIMEOUT ST (SAFETY) ×2 IMPLANT
CONT SPEC 4OZ CLIKSEAL STRL BL (MISCELLANEOUS) ×2 IMPLANT
DECANTER SPIKE VIAL GLASS SM (MISCELLANEOUS) ×2 IMPLANT
DERMABOND ADVANCED (GAUZE/BANDAGES/DRESSINGS) ×1
DERMABOND ADVANCED .7 DNX12 (GAUZE/BANDAGES/DRESSINGS) ×1 IMPLANT
DRAPE LAPAROTOMY 100X72X124 (DRAPES) ×2 IMPLANT
DRAPE MICROSCOPE LEICA (MISCELLANEOUS) ×2 IMPLANT
DRAPE POUCH INSTRU U-SHP 10X18 (DRAPES) ×2 IMPLANT
DRAPE PROXIMA HALF (DRAPES) ×2 IMPLANT
DURAPREP 26ML APPLICATOR (WOUND CARE) ×2 IMPLANT
ELECT REM PT RETURN 9FT ADLT (ELECTROSURGICAL) ×2
ELECTRODE REM PT RTRN 9FT ADLT (ELECTROSURGICAL) ×1 IMPLANT
GAUZE SPONGE 4X4 16PLY XRAY LF (GAUZE/BANDAGES/DRESSINGS) ×2 IMPLANT
GLOVE BIOGEL PI IND STRL 8 (GLOVE) ×1 IMPLANT
GLOVE BIOGEL PI IND STRL 8.5 (GLOVE) ×1 IMPLANT
GLOVE BIOGEL PI INDICATOR 8 (GLOVE) ×1
GLOVE BIOGEL PI INDICATOR 8.5 (GLOVE) ×1
GLOVE ECLIPSE 7.5 STRL STRAW (GLOVE) ×6 IMPLANT
GLOVE ECLIPSE 8.5 STRL (GLOVE) ×2 IMPLANT
GLOVE EXAM NITRILE LRG STRL (GLOVE) IMPLANT
GLOVE EXAM NITRILE MD LF STRL (GLOVE) IMPLANT
GLOVE EXAM NITRILE XL STR (GLOVE) IMPLANT
GLOVE EXAM NITRILE XS STR PU (GLOVE) IMPLANT
GOWN BRE IMP SLV AUR LG STRL (GOWN DISPOSABLE) IMPLANT
GOWN BRE IMP SLV AUR XL STRL (GOWN DISPOSABLE) ×4 IMPLANT
GOWN STRL REIN 2XL LVL4 (GOWN DISPOSABLE) ×2 IMPLANT
KIT BASIN OR (CUSTOM PROCEDURE TRAY) ×2 IMPLANT
KIT ROOM TURNOVER OR (KITS) ×2 IMPLANT
NEEDLE HYPO 22GX1.5 SAFETY (NEEDLE) ×2 IMPLANT
NEEDLE SPNL 20GX3.5 QUINCKE YW (NEEDLE) ×2 IMPLANT
NS IRRIG 1000ML POUR BTL (IV SOLUTION) ×2 IMPLANT
PACK LAMINECTOMY NEURO (CUSTOM PROCEDURE TRAY) ×2 IMPLANT
PAD ARMBOARD 7.5X6 YLW CONV (MISCELLANEOUS) ×6 IMPLANT
PATTIES SURGICAL .5 X1 (DISPOSABLE) ×2 IMPLANT
RUBBERBAND STERILE (MISCELLANEOUS) ×4 IMPLANT
SPONGE GAUZE 4X4 12PLY (GAUZE/BANDAGES/DRESSINGS) ×2 IMPLANT
SPONGE SURGIFOAM ABS GEL SZ50 (HEMOSTASIS) ×2 IMPLANT
SUT VIC AB 1 CT1 18XBRD ANBCTR (SUTURE) ×1 IMPLANT
SUT VIC AB 1 CT1 8-18 (SUTURE) ×2
SUT VIC AB 2-0 CP2 18 (SUTURE) ×2 IMPLANT
SUT VIC AB 3-0 SH 8-18 (SUTURE) ×2 IMPLANT
SYR 20ML ECCENTRIC (SYRINGE) ×2 IMPLANT
TOWEL OR 17X24 6PK STRL BLUE (TOWEL DISPOSABLE) ×2 IMPLANT
TOWEL OR 17X26 10 PK STRL BLUE (TOWEL DISPOSABLE) ×2 IMPLANT
WATER STERILE IRR 1000ML POUR (IV SOLUTION) ×2 IMPLANT

## 2012-08-30 NOTE — H&P (Signed)
William Hendrix is an 71 y.o. male.   Chief Complaint: Lumbar stenosis L3-4 L4-5 HPI:  William Argyle. Hendrix   DOB:  1941/02/21     William Hendrix is admitted for surgery today.  I had last visited with him in 2006 after he was recovering from some cervical surgery.  We did talk about difficulties with his lumbar spine and previously he has had a lumbar MRI which demonstrated that he had a moderately severe stenosis at L3-4 and L4-5.  I also noted that he had some advanced epidural lipomatosis.  William Hendrix tells me that about three weeks ago fairy acutely he developed worse pain in his back and his legs, particularly in the left leg radiating down to the anterior thigh to the level of the knee.  This has been severe and unrelenting.  He was in the Emergency Department the other day for a prolonged period of time noting that the pain has been intolerable.  He cannot do activities of daily living.  He has a good deal of difficulty getting around.  He has, however, lost approximately 30 pounds of weight in the last 12 months.    Because of this exacerbation of pain, on 08/11/2012 he underwent an MRI of the lumbar spine.  This study demonstrates that he has moderately severe stenosis at L3-4 and severe stenosis at L4-5.  This is centrally.  The lateral recesses also have some modest degenerative changes and stenosis.    After careful review, I note that some of the epidural lipomatosis appears to be marginally improved but it is my feeling that the stenosis appears worse at these two levels.  This is largely due to a hypertrophy of the interspinous ligament and broad based bulging of the discs.  Clinically, the patient has mostly a left-sided radiculopathy.  He has some give away weakness in the iliopsoas on either lower extremity, the left feeling somewhat weaker than the right.  The tibialis anterior strength is a good 4+/5 bilaterally.    Overall, I feel that his problem is related to lumbar stenosis at L3-4 and  L4-5.  In the past, we have considered treating him with epidural steroid injections.  However, Quinzell is diabetic.  He is currently being managed with Glimepiride.  I feel that ultimately he will require surgical decompression at L3-4 and L4-5 and the best way to do this would be with laminotomies at each level to decompress the central canal and exiting nerve roots above and below.  We would like to avoid any need for arthrodesis or fusion; however, I do not believe that any more conservative treatment such as consideration of an epidural would be appropriate given his previous history of lipomatosis and his significant history of diabetes.  I believe that we can do simple bilateral laminotomies at 3-4 and 4-5 and effect a significant decompression that would allow him to improve.    Past Medical History  Diagnosis Date  . Obesity   . Edema   . DM (diabetes mellitus)   . Renal insufficiency      CKD... creatinine up to 1.5 in hospital after dye in October, 2011  . Fatigue   . Nephrolithiasis   . Gout   . Skin cancer   . CAD (coronary artery disease)     stent 2000 /  cath,stent patent 2006 / cath 2007,stent probably patenet ,but difficult to assess / cath 07/2010, patent LAD stent with mild instent restenosis, moderagte elevation LVEDP  . Ventral hernia   .  Sinusitis   . Back problem   . Hyperlipidemia   . HTN (hypertension)   . Chest pain     See data hospital 07/2010  . Drug therapy     Ntg intolerance  . Positive D dimer     significant elevation ,hospital 07/2010, etiology unclear  . Ejection fraction     55%, 07/2010, mild inferior hypo  . SOB (shortness of breath)     mild restrictive defect on PFTs...likely due to obesity and some volume overload  . Dizziness     Mild positional dizziness May, 2012  . History of pneumonia   . Melanoma     Melanoma removed from the left forearm with wide excision May, 2012  . Pneumonia     hx x4 young  . Sleep apnea     CPAP,  [name]  since 2000  . OSA (obstructive sleep apnea)      CPAP.. Dr. Craige Cotta  . GERD (gastroesophageal reflux disease)   . H/O hiatal hernia   . Arthritis     Past Surgical History  Procedure Date  . Nose surgery   . Cardiac catheterization   . Coronary stent placement 2000    CAD  . Knee arthroscopy 90's    rt  . Cervical disc surgery 90's    Family History  Problem Relation Age of Onset  . Prostate cancer    . Kidney cancer    . Cancer      Bladder cancer  . Coronary artery disease     Social History:  reports that he quit smoking about 15 years ago. He does not have any smokeless tobacco history on file. He reports that he drinks alcohol. He reports that he does not use illicit drugs.  Allergies:  Allergies  Allergen Reactions  . Codeine Other (See Comments)    GI UPSET & TREMORS    Medications Prior to Admission  Medication Sig Dispense Refill  . allopurinol (ZYLOPRIM) 300 MG tablet Take 300 mg by mouth daily.      Marland Kitchen aspirin EC 81 MG tablet Take 81 mg by mouth daily.      Marland Kitchen atenolol (TENORMIN) 50 MG tablet Take 50 mg by mouth daily.      . Cholecalciferol (VITAMIN D) 1000 UNITS capsule Take 1,000 Units by mouth at bedtime.       . fenofibrate 160 MG tablet Take 160 mg by mouth 2 (two) times daily.      . fluocinonide cream (LIDEX) 0.05 % Apply 1 application topically daily as needed. FOR RASH      . furosemide (LASIX) 80 MG tablet Take 80 mg by mouth daily.      Marland Kitchen gabapentin (NEURONTIN) 300 MG capsule Take 300 mg by mouth 3 (three) times daily.      Marland Kitchen glimepiride (AMARYL) 2 MG tablet Take 2 mg by mouth 2 (two) times daily.      Marland Kitchen oxyCODONE-acetaminophen (PERCOCET/ROXICET) 5-325 MG per tablet Take 2 tablets by mouth every 4 (four) hours as needed for pain.  15 tablet  0  . ranitidine (ZANTAC) 75 MG tablet Take 75 mg by mouth every morning.      . nitroGLYCERIN (NITROSTAT) 0.4 MG SL tablet Place 0.4 mg under the tongue every 5 (five) minutes as needed. FOR CHEST PAIN         Results for orders placed during the hospital encounter of 08/30/12 (from the past 48 hour(s))  GLUCOSE, CAPILLARY     Status:  Abnormal   Collection Time   08/30/12  6:28 AM      Component Value Range Comment   Glucose-Capillary 163 (*) 70 - 99 mg/dL    Dg Chest 2 View  40/98/1191  *RADIOLOGY REPORT*  Clinical Data: 71 year old male preoperative study for lumbar surgery.  Shortness of breath, chest pain, diabetes, hypertension.  CHEST - 2 VIEW  Comparison: 08/07/2010 and earlier.  Findings: Stable lung volumes.  Stable mild cardiomegaly. Other mediastinal contours are within normal limits.  No pneumothorax, pulmonary edema, pleural effusion or acute pulmonary opacity. Stable visualized osseous structures.  IMPRESSION: No acute cardiopulmonary abnormality.   Original Report Authenticated By: Erskine Speed, M.D.     Review of Systems  HENT: Negative.   Eyes: Negative.   Respiratory: Negative.   Cardiovascular: Negative.   Gastrointestinal: Negative.   Genitourinary: Negative.   Musculoskeletal: Positive for back pain.  Skin: Negative.   Neurological: Positive for focal weakness and weakness.  Endo/Heme/Allergies: Negative.   Psychiatric/Behavioral: Negative.     Blood pressure 135/74, pulse 72, temperature 97.9 F (36.6 C), temperature source Oral, resp. rate 20, SpO2 95.00%. Physical Exam  Constitutional: He is oriented to person, place, and time. He appears well-developed.       Moderately obese  HENT:  Head: Normocephalic and atraumatic.  Eyes: Conjunctivae normal and EOM are normal. Pupils are equal, round, and reactive to light.  Neck: Normal range of motion. Neck supple.  Cardiovascular: Normal rate and regular rhythm.   Respiratory: Effort normal and breath sounds normal.  GI: Soft. Bowel sounds are normal.  Musculoskeletal:       Weakness of iliopsoas and quadriceps bilaterally 4/5 absent reflexes in patella and the Achilles  Neurological: He is alert and oriented to  person, place, and time. He displays abnormal reflex. No cranial nerve deficit. Coordination normal.  Skin: Skin is warm and dry.  Psychiatric: He has a normal mood and affect. His behavior is normal. Judgment and thought content normal.     Assessment/Plan Lumbar stenosis L3-4 L4-5 with neurogenic claudication, back pain.  Plan: Bilateral laminotomies and foraminotomies L3-4 L4-5.  Marypat Kimmet J 08/30/2012, 7:51 AM

## 2012-08-30 NOTE — Progress Notes (Signed)
Patient ID: William Hendrix, male   DOB: [birth year], 71 y.o.   MRN: [id number] Vital signs are stable. Patient notes modest pain in the lower lumbar spine. Motor function appears intact the left leg seems slightly weaker.  Plan transfer to the floor mobilize as tolerated.

## 2012-08-30 NOTE — Transfer of Care (Signed)
Immediate Anesthesia Transfer of Care Note  Patient: William Hendrix  Procedure(s) Performed: Procedure(s) (LRB) with comments: LUMBAR LAMINECTOMY/DECOMPRESSION MICRODISCECTOMY 2 LEVELS (Bilateral) - Bilateral Lumbar three-four Lumbar four-five Laminotomies  Patient Location: PACU  Anesthesia Type:General  Level of Consciousness: awake, alert , oriented and patient cooperative  Airway & Oxygen Therapy: Patient Spontanous Breathing and Patient connected to nasal cannula oxygen  Post-op Assessment: Report given to PACU RN, Post -op Vital signs reviewed and stable and Patient moving all extremities X 4  Post vital signs: Reviewed and stable  Complications: No apparent anesthesia complications

## 2012-08-30 NOTE — Op Note (Signed)
Preoperative diagnosis: Lumbar spondylosis and stenosis L3-4 L4-5, lumbar radiculopathy, neurogenic claudication Postoperative diagnosis: Lumbar spondylosis and stenosis L3-4 L4-5, lumbar radiculopathy neurogenic claudication Procedure: Bilateral laminotomies L3-4 L4-5, operating microscope microdissection technique decompression of L3-L4 and L5 nerve roots. Surgeon: Barnett Abu M.D. Assistant: Colon Branch M.D. Anesthesia: Gen. endotracheal Indications: The patient is a 71 year old individual is had significant problems with back pain bilateral leg pain have been much worse in the recent past he was initially evaluated in 2006 for this condition and has been treated conservatively to a greater or lesser extent with success this past year however he is had increasingly unremitting back pain and bilateral lower extremity pain such that his gait has become unsteady and he can only tolerate walking short distances a recent MRI demonstrates that he said progression of spondylitic changes at L3-4 and L4-5 also has epidural lipomatosis please undergone a 30 pound weight loss despite this his neurologic status has not changed and he is now been advised regarding surgical decompression.  Procedure: Patient was brought to the operating room supine on a stretcher. After the smooth induction of general endotracheal anesthesia he was turned prone onto the operating table. The back was prepped with alcohol and DuraPrep and draped in a sterile fashion. Localizing radiographs identified the interspace at L4-5 and L5-S1. A midline incision was created and carried down to the lumbar dorsal fascia which was opened on either side of midline at this level. The dissection was carried out over the interlaminar space and the facet joints at L4-5 and L3-4. A self-retaining retractor was placed in the wound. A high-speed drill was then used to remove the inferior margin of the lamina out to the medial wall the facet performing the  initial portion of the dissection. The yellow ligament was then taken up and removed. Common dural tube was identified and dissection was carefully undertaken removing redundant yellow ligament and overgrown facet from the superior facet of L5 and the laminar arch of L4. A foraminotomy was created over the L5 nerve root. Under the lamina of L4 superiorly and laterally decompression was performed using a combination of straight punches and curves punches to clean the foramen for the L4 nerve root. The first laminotomy was created on the left side at the L4-L5 level then a similar laminotomy was created at L3-L4 on the left side. Similar decompression for the L4 nerve root inferiorly and the L3 nerve root superiorly was completed. This was done with the use of the operating microscope microdissection technique throughout the entirety of the decompression. Care was taken to protect the dura and the nerve roots while doing the individual decompressions. Once the left side was completed attention was turned to the right side were similar laminotomies and foraminotomies were created for the L5 nerve root inferiorly the L4 nerve root superiorly and then at L3-4 the L4 nerve root inferiorly and the health III nerve root superiorly.   Once an adequate decompression was identified and secured, hemostasis and the soft tissues obtained meticulously and when verified retractor was removed the wound was irrigated copiously with antibiotic irrigating solution, and then the lumbar dorsal fascia was closed with #1 Vicryl in interrupted fashion. 20 cc Of half percent Marcaine was injected into the paraspinous fascia. 2-0 Vicryl was used to close the subcutaneous fascia and 3-0 Vicryl was used to close the subcuticular skin. Blood loss was estimated as 100 cc. The patient was returned to the recovery room in stable condition

## 2012-08-30 NOTE — Anesthesia Procedure Notes (Signed)
Procedure Name: Intubation Date/Time: 08/30/2012 8:04 AM Performed by: Rogelia Boga Pre-anesthesia Checklist: Patient identified, Emergency Drugs available, Suction available, Patient being monitored and Timeout performed Patient Re-evaluated:Patient Re-evaluated prior to inductionOxygen Delivery Method: Circle system utilized Preoxygenation: Pre-oxygenation with 100% oxygen Intubation Type: IV induction Ventilation: Mask ventilation without difficulty and Oral airway inserted - appropriate to patient size Laryngoscope Size: Mac and 4 Grade View: Grade II Tube type: Oral Tube size: 7.5 mm Number of attempts: 1 Airway Equipment and Method: Stylet Placement Confirmation: ETT inserted through vocal cords under direct vision,  positive ETCO2 and breath sounds checked- equal and bilateral Secured at: 23 cm Tube secured with: Tape

## 2012-08-30 NOTE — Anesthesia Preprocedure Evaluation (Addendum)
Anesthesia Evaluation  Patient identified by MRN, date of birth, ID band Patient awake    Reviewed: Allergy & Precautions, H&P , NPO status , Patient's Chart, lab work & pertinent test results, reviewed documented beta blocker date and time   History of Anesthesia Complications Negative for: history of anesthetic complications  Airway Mallampati: II TM Distance: >3 FB Neck ROM: Full    Dental  (+) Dental Advisory Given   Pulmonary shortness of breath and with exertion, sleep apnea and Continuous Positive Airway Pressure Ventilation , former smoker,  breath sounds clear to auscultation  Pulmonary exam normal       Cardiovascular hypertension, Pt. on medications and Pt. on home beta blockers + CAD and + Cardiac Stents (stent x4) Rhythm:Regular Rate:Normal  Myrtis Ser cleared '11 stress test: no ischemia, EF 55%   Neuro/Psych PSYCHIATRIC DISORDERS Anxiety Depression Chronic back pain: tylenol  Neuromuscular disease    GI/Hepatic hiatal hernia, GERD-  Medicated and Controlled,  Endo/Other  diabetes (glu 163), Well Controlled, Type 2, Oral Hypoglycemic AgentsMorbid obesity  Renal/GU Renal InsufficiencyRenal disease (creat 1.32)     Musculoskeletal   Abdominal (+) + obese,   Peds  Hematology   Anesthesia Other Findings   Reproductive/Obstetrics                          Anesthesia Physical Anesthesia Plan  ASA: III  Anesthesia Plan: General   Post-op Pain Management:    Induction: Intravenous  Airway Management Planned: Oral ETT  Additional Equipment:   Intra-op Plan:   Post-operative Plan: Extubation in OR  Informed Consent: I have reviewed the patients History and Physical, chart, labs and discussed the procedure including the risks, benefits and alternatives for the proposed anesthesia with the patient or authorized representative who has indicated his/her understanding and acceptance.    Dental advisory given  Plan Discussed with: CRNA, Anesthesiologist and Surgeon  Anesthesia Plan Comments: (plan routine monitors, GETA)       Anesthesia Quick Evaluation

## 2012-08-30 NOTE — Progress Notes (Signed)
UR COMPLETED  

## 2012-08-30 NOTE — Anesthesia Postprocedure Evaluation (Signed)
  Anesthesia Post-op Note  Patient: William Hendrix  Procedure(s) Performed: Procedure(s) (LRB) with comments: LUMBAR LAMINECTOMY/DECOMPRESSION MICRODISCECTOMY 2 LEVELS (Bilateral) - Bilateral Lumbar three-four Lumbar four-five Laminotomies  Patient Location: PACU  Anesthesia Type:General  Level of Consciousness: awake, alert , oriented and patient cooperative  Airway and Oxygen Therapy: Patient Spontanous Breathing and Patient connected to nasal cannula oxygen  Post-op Pain: none  Post-op Assessment: Post-op Vital signs reviewed, Patient's Cardiovascular Status Stable, Respiratory Function Stable, Patent Airway, No signs of Nausea or vomiting and Pain level controlled  Post-op Vital Signs: Reviewed and stable  Complications: No apparent anesthesia complications

## 2012-08-31 LAB — GLUCOSE, CAPILLARY
Glucose-Capillary: 164 mg/dL — ABNORMAL HIGH (ref 70–99)
Glucose-Capillary: 177 mg/dL — ABNORMAL HIGH (ref 70–99)

## 2012-08-31 MED ORDER — TAMSULOSIN HCL 0.4 MG PO CAPS
0.4000 mg | ORAL_CAPSULE | Freq: Every day | ORAL | Status: DC
  Administered 2012-08-31 – 2012-09-02 (×3): 0.4 mg via ORAL
  Filled 2012-08-31 (×3): qty 1

## 2012-08-31 NOTE — Evaluation (Signed)
Occupational Therapy Evaluation Patient Details Name: William Hendrix MRN: [id number] DOB: [birth year] Today's Date: 08/31/2012 Time: 7829-5621 OT Time Calculation (min): 33 min  OT Assessment / Plan / Recommendation Clinical Impression  Pt s/p Bilateral laminotomies L3-4 L4-5, operating microscope microdissection technique decompression of L3-L4 and L5 nerve roots. Pt's independence and safety with ADLs limited by pain, back precautions, LLE weakness and balance deficits. Pt will benefit from skilled OT in the acute setting to maximize I with ADL and ADL mobility prior to d/c    OT Assessment  Patient needs continued OT Services    Follow Up Recommendations  No OT follow up;Supervision/Assistance - 24 hour    Barriers to Discharge      Equipment Recommendations  None recommended by OT    Recommendations for Other Services    Frequency  Min 2X/week    Precautions / Restrictions Precautions Precautions: Back   Pertinent Vitals/Pain Pt reports 7/10 back pain and states he was recently premedicated.     ADL  Grooming: Min guard Where Assessed - Grooming: Unsupported standing Upper Body Dressing: Minimal assistance Where Assessed - Upper Body Dressing: Unsupported sitting Lower Body Dressing: Moderate assistance Where Assessed - Lower Body Dressing: Unsupported sit to stand Toilet Transfer: Min Pension scheme manager Method: Sit to Barista: Regular height toilet;Grab bars Toileting - Clothing Manipulation and Hygiene: Minimal assistance Where Assessed - Engineer, mining and Hygiene: Sit to stand from 3-in-1 or toilet Tub/Shower Transfer: Min guard Tub/Shower Transfer Method: Science writer: Walk in shower Equipment Used: Gait belt;Rolling walker Transfers/Ambulation Related to ADLs: attempted ambulation with no AD; pt unsteady and losing balance and attempting to steady self with walls and furniture. Provided pt  with RW with which he was much steadier. Educated pt on proper positioning in the RW during ambulation ADL Comments: pt educated on back precautions. Pt insists he has had back sx before and knows precautions; however, requires Min-Mod cues to maintain during functional activities. Pt educated on AE and states he owns all equipment from wife's previous sx. Pt easily distracted and at times question if he is slow to process    OT Diagnosis: Generalized weakness;Acute pain  OT Problem List: Decreased strength;Decreased activity tolerance;Impaired balance (sitting and/or standing);Decreased knowledge of use of DME or AE;Decreased knowledge of precautions;Pain OT Treatment Interventions: Self-care/ADL training;DME and/or AE instruction;Therapeutic activities;Patient/family education;Balance training   OT Goals Acute Rehab OT Goals OT Goal Formulation: With patient Time For Goal Achievement: 09/07/12 Potential to Achieve Goals: Good ADL Goals Pt Will Perform Grooming: Independently;Standing at sink ADL Goal: Grooming - Progress: Goal set today Pt Will Perform Upper Body Dressing: Independently;Sitting, chair;Sitting, bed ADL Goal: Upper Body Dressing - Progress: Goal set today Pt Will Perform Lower Body Dressing: with min assist;Sit to stand from bed;Sit to stand from chair ADL Goal: Lower Body Dressing - Progress: Goal set today Pt Will Transfer to Toilet: with modified independence;Ambulation;with DME ADL Goal: Toilet Transfer - Progress: Goal set today Pt Will Perform Toileting - Hygiene: with modified independence;with adaptive equipment;Standing at 3-in-1/toilet ADL Goal: Toileting - Hygiene - Progress: Goal set today Pt Will Perform Tub/Shower Transfer: Shower transfer;with modified independence;Ambulation;with DME ADL Goal: Tub/Shower Transfer - Progress: Goal set today Additional ADL Goal #1: Pt will I'ly verbalize/generalize 3/3 back precautions for use with functional activities.  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   Prior Functioning     Home Living Lives With: Spouse Available Help at Discharge: Family;Available 24 hours/day Type of Home: House Home Access: Stairs to enter [employer] of Steps: 1 Entrance Stairs-Rails:  (door frame) Home Layout: One level Bathroom Shower/Tub: Walk-in shower;Door [employer] Toilet: Handicapped height Bathroom Accessibility: Yes How Accessible: Accessible via walker Home Adaptive Equipment: Built-in shower seat;Reacher;Sock aid;Straight cane;Walker - rolling;Long-handled shoehorn;Long-handled sponge;Grab bars in shower;Grab bars around toilet Additional Comments: pt's bathrooms are handicap accessible  Prior Function Level of Independence: Independent with assistive device(s) Able to Take Stairs?: Yes Driving: Yes Vocation: Self employed Comments: 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 Communication Communication: No difficulties Dominant Hand: Right         Vision/Perception     Cognition  Overall Cognitive Status: Appears within functional limits for tasks assessed/performed Arousal/Alertness: Awake/alert Orientation Level: Appears intact for tasks assessed Behavior During Session: [facility] for tasks performed    Extremity/Trunk Assessment Right Upper Extremity Assessment RUE ROM/Strength/Tone: Unable to fully assess;Due to pain;Due to precautions RUE Sensation: WFL - Light Touch RUE Coordination: WFL - gross/fine motor Left Upper Extremity Assessment LUE ROM/Strength/Tone: Unable to fully assess;Due to pain;Due to precautions LUE Sensation: WFL - Light Touch LUE Coordination: WFL - gross/fine motor     Mobility       Shoulder  Instructions     Exercise     Balance     End of Session OT - End of Session Equipment Utilized During Treatment: Gait belt Activity Tolerance: Patient tolerated treatment well Patient left: in bed (sitting EOB) Nurse Communication: Mobility status  GO     Zelia Yzaguirre 08/31/2012, 10:11 AM

## 2012-08-31 NOTE — Progress Notes (Signed)
Physical Therapy Evaluation Patient Details Name: DEVONTAE CASASOLA MRN: [id number] DOB: 01/05/1941 Today's Date: 08/31/2012 Time: 4098-1191 PT Time Calculation (min): 30 min  PT Assessment / Plan / Recommendation Clinical Impression  71 yo s/p bil lumbar foraminotomies/nerve root decompressions presents to PT with decr functional mobility; Will benefit from PT to maximize independence and safety with mobility and enable safe dc home    PT Assessment  Patient needs continued PT services    Follow Up Recommendations  Home health PT;Supervision/Assistance - 24 hour    Does the patient have the potential to tolerate intense rehabilitation      Barriers to Discharge None      Equipment Recommendations  None recommended by PT    Recommendations for Other Services     Frequency Min 5X/week    Precautions / Restrictions Precautions Precautions: Back   Pertinent Vitals/Pain 8/10 back pain; Repositioned in bed      Mobility  Bed Mobility Bed Mobility: Sit to Sidelying Left Sit to Sidelying Left: 4: Min assist Details for Bed Mobility Assistance: Cues for logroll/sidelie to sit technique for back precautions; min physical assist for LEs Transfers Transfers: Sit to Stand;Stand to Sit Sit to Stand: 4: Min guard;With upper extremity assist;From bed;From toilet (without physical contact) Stand to Sit: 4: Min guard;To bed;To toilet Details for Transfer Assistance: Cues for back precautions during transitional movements; Noted heavy dependence on UE support with sit to stand Ambulation/Gait Ambulation/Gait Assistance: 4: Min guard (with and without physical contact) Ambulation Distance (Feet): 180 Feet Assistive device: Rolling walker Ambulation/Gait Assistance Details: Mod cues for posture, RW proximity; Very slow moving, and tends to stop walking when engaged in conversation Gait Pattern: Decreased stride length Gait velocity: quite slow    Shoulder Instructions     Exercises      PT Diagnosis: Difficulty walking;Generalized weakness;Acute pain  PT Problem List: Decreased strength;Decreased activity tolerance;Decreased mobility;Decreased balance;Decreased knowledge of use of DME;Decreased knowledge of precautions;Pain PT Treatment Interventions: DME instruction;Gait training;Stair training;Functional mobility training;Therapeutic activities;Therapeutic exercise;Balance training;Patient/family education   PT Goals Acute Rehab PT Goals PT Goal Formulation: With patient Time For Goal Achievement: 08/31/12 Potential to Achieve Goals: Good Pt will Roll Supine to Right Side: with modified independence PT Goal: Rolling Supine to Right Side - Progress: Goal set today Pt will Roll Supine to Left Side: with modified independence PT Goal: Rolling Supine to Left Side - Progress: Goal set today Pt will go Supine/Side to Sit: with modified independence PT Goal: Supine/Side to Sit - Progress: Goal set today Pt will go Sit to Supine/Side: with modified independence PT Goal: Sit to Supine/Side - Progress: Goal set today Pt will go Sit to Stand: with supervision PT Goal: Sit to Stand - Progress: Goal set today Pt will go Stand to Sit: with supervision PT Goal: Stand to Sit - Progress: Goal set today Pt will Ambulate: >150 feet;with supervision;with rolling walker PT Goal: Ambulate - Progress: Goal set today Pt will Go Up / Down Stairs: 1-2 stairs;with supervision;with rolling walker PT Goal: Up/Down Stairs - Progress: Goal set today Additional Goals Additional Goal #1: Pt will correctly verbalaize and utilize back precautions during mobility activities PT Goal: Additional Goal #1 - Progress: Goal set today  Visit Information  Last PT Received On: 08/31/12 Assistance Needed: +1    Subjective Data  Subjective: Reports he is well-versed in using RW Patient Stated Goal: Home   Prior Functioning  Home Living Lives With: Spouse Available Help at Discharge:  Family;Available 24  hours/day Type of Home: House Home Access: Stairs to enter [employer] of Steps: 1 Entrance Stairs-Rails: None (door frame) Home Layout: One level Bathroom Shower/Tub: Walk-in shower;Door [employer] Toilet: Handicapped height Bathroom Accessibility: Yes How Accessible: Accessible via walker Home Adaptive Equipment: Built-in shower seat;Reacher;Sock aid;Straight cane;Walker - rolling;Long-handled shoehorn;Long-handled sponge;Grab bars in shower;Grab bars around toilet Additional Comments: pt's bathrooms are handicap accessible  Prior Function Level of Independence: Independent with assistive device(s) Able to Take Stairs?: Yes Driving: Yes 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 Dominant Hand: Right    Cognition  Overall Cognitive Status: Appears within functional limits for tasks assessed/performed Arousal/Alertness: Awake/alert Orientation Level: Appears intact for tasks assessed Behavior During Session: [facility] for tasks performed    Extremity/Trunk Assessment Right Upper Extremity Assessment RUE ROM/Strength/Tone: Unable to fully assess;Due to pain;Due to precautions RUE Sensation: WFL - Light Touch RUE Coordination: WFL - gross/fine motor Left Upper Extremity Assessment LUE ROM/Strength/Tone: Unable to fully assess;Due to pain;Due to precautions LUE Sensation: WFL - Light Touch LUE Coordination: WFL - gross/fine motor Right Lower Extremity Assessment RLE ROM/Strength/Tone: Deficits;Due to pain RLE ROM/Strength/Tone Deficits: Generally weak as well, with dependence on UE assist for sit to stand Left Lower Extremity Assessment LLE ROM/Strength/Tone: Deficits;Due to pain LLE ROM/Strength/Tone Deficits: Generally weak as well, with dependence on UEs for sit to stand   Balance    End of Session PT - End of Session Activity Tolerance: Patient tolerated treatment well Patient left: in  bed;with call bell/phone within reach Nurse Communication: Mobility status  GP     Van Clines [facility] [place], [place] [phone]  08/31/2012, 10:40 AM

## 2012-08-31 NOTE — Progress Notes (Signed)
Subjective: Patient reports difficulty with voiding.left leg a little weak,  Objective: Vital signs in last 24 hours: Temp:  [97.7 F (36.5 C)-99.8 F (37.7 C)] 99.2 F (37.3 C) (11/20 0814) Pulse Rate:  [59-85] 84  (11/20 0814) Resp:  [8-22] 18  (11/20 0814) BP: (105-171)/(45-79) 109/50 mmHg (11/20 0814) SpO2:  [92 %-98 %] 92 % (11/20 0814)  Intake/Output from previous day: 11/19 0701 - 11/20 0700 In: 2680 [P.O.:480; I.V.:2200] Out: 2885 [Urine:2785; Blood:100] Intake/Output this shift:    incision clean dry. Motor function ok  Lab Results:  Basename 08/29/12 1145  WBC 5.9  HGB 14.5  HCT 41.7  PLT 171   BMET  Basename 08/29/12 1145  NA 135  K 4.0  CL 96  CO2 27  GLUCOSE 327*  BUN 24*  CREATININE 1.32  CALCIUM 10.0    Studies/Results: Dg Chest 2 View  08/29/2012  *RADIOLOGY REPORT*  Clinical Data: 71 year old male preoperative study for lumbar surgery.  Shortness of breath, chest pain, diabetes, hypertension.  CHEST - 2 VIEW  Comparison: 08/07/2010 and earlier.  Findings: Stable lung volumes.  Stable mild cardiomegaly. Other mediastinal contours are within normal limits.  No pneumothorax, pulmonary edema, pleural effusion or acute pulmonary opacity. Stable visualized osseous structures.  IMPRESSION: No acute cardiopulmonary abnormality.   Original Report Authenticated By: Erskine Speed, M.D.    Dg Lumbar Spine 1 View  08/30/2012  *RADIOLOGY REPORT*  Clinical Data: For lumbar laminectomy  LUMBAR SPINE - 1 VIEW  Comparison: Lumbar radiographs, 08/23/2012  Findings: A single cross-table lateral view shows to surgical probes, one posterior to the L4-L5 disc interspace and the other posterior to the L5-S1 disc interspace.   Original Report Authenticated By: Amie Portland, M.D.     Assessment/Plan: Urinary retention  LOS: 1 day  start Flomax   William Hendrix 08/31/2012, 8:51 AM

## 2012-09-01 ENCOUNTER — Encounter (HOSPITAL_COMMUNITY): Payer: Self-pay | Admitting: Neurological Surgery

## 2012-09-01 LAB — GLUCOSE, CAPILLARY
Glucose-Capillary: 184 mg/dL — ABNORMAL HIGH (ref 70–99)
Glucose-Capillary: 226 mg/dL — ABNORMAL HIGH (ref 70–99)

## 2012-09-01 MED ORDER — KETOROLAC TROMETHAMINE 15 MG/ML IJ SOLN
15.0000 mg | Freq: Four times a day (QID) | INTRAMUSCULAR | Status: AC
  Administered 2012-09-01 – 2012-09-02 (×5): 15 mg via INTRAVENOUS
  Filled 2012-09-01 (×3): qty 1

## 2012-09-01 NOTE — Progress Notes (Signed)
PT Cancellation Note  Patient Details Name: William Hendrix MRN: [id number] DOB: 18-Oct-1940   Cancelled Treatment:    Pt amb in hallways on his own with rolling walker. Pt has already amb twice this morning.   [name] 09/01/2012, 12:15 PM

## 2012-09-01 NOTE — Progress Notes (Signed)
PT. STILL HAVING VOIDING PROBLEMS. PT. UP TO BATHROOM AT 0245 AND HAVING BM AND VOIDING 300CC. BLADDER SCAN AT 0255 SHOWED 796CC RESIDUAL. IN & OUT CATH AT 0325 DONE AND 700CC OF DARK AMBER URINE REMOVED. PT. TOLERATED WELL. CHRIS Neville Walston RN

## 2012-09-01 NOTE — Progress Notes (Signed)
Subjective: Patient reports Still with difficulty voiding no volumes increasing. Complains of centralized back pain anterior thigh pain.  Objective: Vital signs in last 24 hours: Temp:  [97.3 F (36.3 C)-98.8 F (37.1 C)] 98.2 F (36.8 C) (11/21 0825) Pulse Rate:  [69-87] 82  (11/21 0825) Resp:  [16-20] 20  (11/21 0825) BP: (111-145)/(47-69) 111/69 mmHg (11/21 0825) SpO2:  [92 %-100 %] 93 % (11/21 0825)  Intake/Output from previous day: 11/20 0701 - 11/21 0700 In: 480 [P.O.:480] Out: 2300 [Urine:2300] Intake/Output this shift: Total I/O In: -  Out: 325 [Urine:325]  Incision is clean and dry motor function appears good in iliopsoas and quadriceps.  Lab Results:  Basename 08/29/12 1145  WBC 5.9  HGB 14.5  HCT 41.7  PLT 171   BMET  Basename 08/29/12 1145  NA 135  K 4.0  CL 96  CO2 27  GLUCOSE 327*  BUN 24*  CREATININE 1.32  CALCIUM 10.0    Studies/Results: No results found.  Assessment/Plan: Skin bladder after next weight check post void residual. Encourage ambulation  LOS: 2 days  Hold off on discharge until patient voiding better and back pain under better control.   Anitra Doxtater J 09/01/2012, 9:06 AM

## 2012-09-01 NOTE — Progress Notes (Signed)
Occupational Therapy Treatment Patient Details Name: William Hendrix MRN: [id number] DOB: [birth year] Today's Date: 09/01/2012 Time: 1027-2536 OT Time Calculation (min): 16 min  OT Assessment / Plan / Recommendation Comments on Treatment Session Pt limited by pain this session. Still unable to verbalize back precautions. However, wife present during session and demonstrates excellent knowledge of back precautions and ADL techniques.  Pt has also been ambulating in hallways multiple times today with wife.  Although pt requiring assist for maintaining back precautions, feel that pt will be appropriate for d/c home with assist from wife.    Follow Up Recommendations  No OT follow up;Supervision/Assistance - 24 hour    Barriers to Discharge       Equipment Recommendations  None recommended by OT    Recommendations for Other Services    Frequency Min 2X/week   Plan Discharge plan remains appropriate    Precautions / Restrictions Precautions Precautions: Back Restrictions Weight Bearing Restrictions: No   Pertinent Vitals/Pain See vitals    ADL  Toilet Transfer: Supervision/safety;Performed Toilet Transfer Method: Sit to Barista: Comfort height toilet Equipment Used: Rolling walker Transfers/Ambulation Related to ADLs: supervision with RW ADL Comments: Pt unable to recall any back precautions, but wife able to independently verbalize 3/3 precautions.  Wife has good understanding techniques pt must use to maintain back precautions, including AE.  Reports she has been ambulating with pt up and down hallway today.  Pt willing to demonstrate toilet transfer for OT but declined further ADL participation (head feeling "funny" from meds)    OT Diagnosis:    OT Problem List:   OT Treatment Interventions:     OT Goals ADL Goals Pt Will Transfer to Toilet: with modified independence;Ambulation;with DME ADL Goal: Toilet Transfer - Progress: Progressing toward  goals Additional ADL Goal #1: Pt will I'ly verbalize/generalize 3/3 back precautions for use with functional activities.  ADL Goal: Additional Goal #1 - Progress: Progressing toward goals  Visit Information  Last OT Received On: 09/01/12    Subjective Data      Prior Functioning       Cognition  Overall Cognitive Status: Appears within functional limits for tasks assessed/performed Arousal/Alertness: Awake/alert Orientation Level: Appears intact for tasks assessed Behavior During Session: [facility] for tasks performed    Mobility  Shoulder Instructions Bed Mobility Bed Mobility: Not assessed (pt sitting EOB) Transfers Transfers: Sit to Stand;Stand to Sit Sit to Stand: 5: Supervision;From bed;From toilet;With upper extremity assist Stand to Sit: 5: Supervision;To bed;To toilet;With upper extremity assist Details for Transfer Assistance: VC for safe hand placement       Exercises      Balance     End of Session OT - End of Session Equipment Utilized During Treatment: Gait belt Activity Tolerance: Patient limited by pain;Patient limited by fatigue Patient left: in bed;with call bell/phone within reach;with family/visitor present (sitting EOB)  GO    09/01/2012 Cipriano Mile OTR/L Pager [phone] Office [phone] Cipriano Mile 09/01/2012, 2:03 PM

## 2012-09-02 ENCOUNTER — Encounter (HOSPITAL_COMMUNITY): Payer: Self-pay

## 2012-09-02 LAB — GLUCOSE, CAPILLARY: Glucose-Capillary: 175 mg/dL — ABNORMAL HIGH (ref 70–99)

## 2012-09-02 MED ORDER — OXYCODONE-ACETAMINOPHEN 5-325 MG PO TABS: 2.0000 | ORAL_TABLET | Freq: Four times a day (QID) | ORAL | Status: DC | PRN

## 2012-09-02 NOTE — Progress Notes (Signed)
PT Note  Pt has been amb multiple times with wife.  No further questions.  Ready for DC.  [employer] PT [phone]

## 2012-09-03 NOTE — Discharge Summary (Signed)
Physician Discharge Summary  Patient ID: William Hendrix MRN: [id number] DOB/AGE: [birth year] 72 y.o.  Admit date: 08/30/2012 Discharge date: 09/03/2012  Admission Diagnoses: Spinal stenosis    Discharge Diagnoses: Same   Discharged Condition: good  Hospital Course: The patient was admitted on 08/30/2012 and taken to the operating room where the patient underwent decompression laminectomy. The patient tolerated the procedure well and was taken to the recovery room and then to the floor in stable condition. The hospital course was routine. There were no complications. The wound remained clean dry and intact. Pt had appropriate back soreness. No complaints of leg pain or new N/T/W. The patient remained afebrile with stable vital signs, and tolerated a regular diet. The patient continued to increase activities, and pain was well controlled with oral pain medications. He had some issues with postoperative urinary retention, but once this cleared he was discharged home in stable condition.  Consults: None  Significant Diagnostic Studies:  Results for orders placed during the hospital encounter of 08/30/12  GLUCOSE, CAPILLARY      Component Value Range   Glucose-Capillary 163 (*) 70 - 99 mg/dL  GLUCOSE, CAPILLARY      Component Value Range   Glucose-Capillary 154 (*) 70 - 99 mg/dL  GLUCOSE, CAPILLARY      Component Value Range   Glucose-Capillary 271 (*) 70 - 99 mg/dL   Comment 1 Notify RN     Comment 2 Documented in Chart    GLUCOSE, CAPILLARY      Component Value Range   Glucose-Capillary 173 (*) 70 - 99 mg/dL   Comment 1 Notify RN     Comment 2 Documented in Chart    GLUCOSE, CAPILLARY      Component Value Range   Glucose-Capillary 164 (*) 70 - 99 mg/dL  GLUCOSE, CAPILLARY      Component Value Range   Glucose-Capillary 177 (*) 70 - 99 mg/dL  GLUCOSE, CAPILLARY      Component Value Range   Glucose-Capillary 172 (*) 70 - 99 mg/dL   Comment 1 Documented in Chart     Comment  2 Notify RN    GLUCOSE, CAPILLARY      Component Value Range   Glucose-Capillary 198 (*) 70 - 99 mg/dL   Comment 1 Documented in Chart     Comment 2 Notify RN    GLUCOSE, CAPILLARY      Component Value Range   Glucose-Capillary 172 (*) 70 - 99 mg/dL  GLUCOSE, CAPILLARY      Component Value Range   Glucose-Capillary 184 (*) 70 - 99 mg/dL  GLUCOSE, CAPILLARY      Component Value Range   Glucose-Capillary 226 (*) 70 - 99 mg/dL   Comment 1 Documented in Chart    GLUCOSE, CAPILLARY      Component Value Range   Glucose-Capillary 254 (*) 70 - 99 mg/dL  GLUCOSE, CAPILLARY      Component Value Range   Glucose-Capillary 175 (*) 70 - 99 mg/dL   Comment 1 Documented in Chart    GLUCOSE, CAPILLARY      Component Value Range   Glucose-Capillary 184 (*) 70 - 99 mg/dL   Comment 1 Documented in Chart      Dg Chest 2 View  08/29/2012  *RADIOLOGY REPORT*  Clinical Data: 71 year old male preoperative study for lumbar surgery.  Shortness of breath, chest pain, diabetes, hypertension.  CHEST - 2 VIEW  Comparison: 08/07/2010 and earlier.  Findings: Stable lung volumes.  Stable mild cardiomegaly. Other  mediastinal contours are within normal limits.  No pneumothorax, pulmonary edema, pleural effusion or acute pulmonary opacity. Stable visualized osseous structures.  IMPRESSION: No acute cardiopulmonary abnormality.   Original Report Authenticated By: Erskine Speed, M.D.    Dg Lumbar Spine Complete  08/23/2012  *RADIOLOGY REPORT*  Clinical Data: Right sacral pain radiating to the legs  LUMBAR SPINE - COMPLETE 4+ VIEW  Comparison: MR lumbar spine of 08/11/2012  Findings: The lumbar vertebrae are in normal alignment.  Anterior osteophytes are noted at L1-2 and L3-4 levels.  No compression deformity is seen.  Intervertebral disc spaces are not significantly narrowed, with only slight narrowing at L3-4.  On oblique views there is degenerative change involving the facet joints of the lower lumbar spine.  The SI  joints appear normal.  IMPRESSION: Normal alignment with degenerative change particularly involving the facet joints of the lower lumbar spine.  No compression deformity.   Original Report Authenticated By: Dwyane Dee, M.D.    Mr Lumbar Spine Wo Contrast  08/11/2012  *RADIOLOGY REPORT*  Clinical Data: Low back pain with left leg pain  MRI LUMBAR SPINE WITHOUT CONTRAST  Technique:  Multiplanar and multiecho pulse sequences of the lumbar spine were obtained without intravenous contrast.  Comparison: MRI 08/31/2005  Findings: Improvement in epidural lipomatosis since the prior study due to weight loss.  2 mm anterior slip L4-5.  Negative for fracture or mass lesion.  Conus medullaris is normal and terminates at L1.  L1-2:  Mild disc bulging and mild facet degeneration.  L2-3:  Disc bulging and facet hypertrophy.  Mild  to moderate spinal stenosis with slight progression from the  prior study.  No focal disc protrusion.  L3-4:  Improvement in central disc protrusion.  There remains disc bulging and spurring and moderate facet hypertrophy.  There is severe spinal stenosis which is unchanged.  L4-5:  Grade 1 anterior slip.  Mild disc bulging and spurring. Advanced facet hypertrophy.  There is moderate to severe spinal stenosis which is unchanged.  L5-S1:  Disc and facet degeneration without significant spinal stenosis.  IMPRESSION: Improvement in epidural lipomatosis due to interval weight loss.  Mild to moderate spinal stenosis L2-3, slightly progressive.  Severe spinal stenosis at L3-4 is similar to the prior study. There is slight improvement in the central disc protrusion.  Moderate to severe spinal stenosis at L4-5 is unchanged.   Original Report Authenticated By: Janeece Riggers, M.D.    Dg Lumbar Spine 1 View  08/30/2012  *RADIOLOGY REPORT*  Clinical Data: For lumbar laminectomy  LUMBAR SPINE - 1 VIEW  Comparison: Lumbar radiographs, 08/23/2012  Findings: A single cross-table lateral view shows to surgical  probes, one posterior to the L4-L5 disc interspace and the other posterior to the L5-S1 disc interspace.   Original Report Authenticated By: Amie Portland, M.D.     Antibiotics:  Anti-infectives     Start     Dose/Rate Route Frequency Ordered Stop   08/30/12 1600   ceFAZolin (ANCEF) IVPB 1 g/50 mL premix        1 g 100 mL/hr over 30 Minutes Intravenous Every 8 hours 08/30/12 1318 08/31/12 0003   08/30/12 0720   bacitracin 50,000 Units in sodium chloride irrigation 0.9 % 500 mL irrigation  Status:  Discontinued          As needed 08/30/12 0847 08/30/12 1229   08/30/12 0600   ceFAZolin (ANCEF) IVPB 2 g/50 mL premix        2 g 100 mL/hr  over 30 Minutes Intravenous On call to O.R. 08/29/12 1417 08/30/12 0815          Discharge Exam: Blood pressure 118/82, pulse 78, temperature 98.7 F (37.1 C), temperature source Oral, resp. rate 20, SpO2 94.00%. Neurologic: Grossly normal Incision okay  Discharge Medications:     Medication List     As of 09/03/2012 10:13 AM    TAKE these medications         allopurinol 300 MG tablet   Commonly known as: ZYLOPRIM   Take 300 mg by mouth daily.      aspirin EC 81 MG tablet   Take 81 mg by mouth daily.      atenolol 50 MG tablet   Commonly known as: TENORMIN   Take 50 mg by mouth daily.      fenofibrate 160 MG tablet   Take 160 mg by mouth 2 (two) times daily.      fluocinonide cream 0.05 %   Commonly known as: LIDEX   Apply 1 application topically daily as needed. FOR RASH      furosemide 80 MG tablet   Commonly known as: LASIX   Take 80 mg by mouth daily.      gabapentin 300 MG capsule   Commonly known as: NEURONTIN   Take 300 mg by mouth 3 (three) times daily.      glimepiride 2 MG tablet   Commonly known as: AMARYL   Take 2 mg by mouth 2 (two) times daily.      nitroGLYCERIN 0.4 MG SL tablet   Commonly known as: NITROSTAT   Place 0.4 mg under the tongue every 5 (five) minutes as needed. FOR CHEST PAIN       oxyCODONE-acetaminophen 5-325 MG per tablet   Commonly known as: PERCOCET/ROXICET   Take 2 tablets by mouth every 6 (six) hours as needed for pain.      ranitidine 75 MG tablet   Commonly known as: ZANTAC   Take 75 mg by mouth every morning.      Vitamin D 1000 UNITS capsule   Take 1,000 Units by mouth at bedtime.        Disposition: Home  Final Dx: Decompressive laminectomy      Discharge Orders    Future Appointments: Provider: Department: Dept Phone: Center:   11/28/2012 1:45 PM [name], MD [facility] Main Office [place]) [phone] [id number]     Future Orders Please Complete By Expires   Diet - low sodium heart healthy      Increase activity slowly      Call MD for:  temperature >100.4      Call MD for:  persistant nausea and vomiting      Call MD for:  severe uncontrolled pain      Call MD for:  redness, tenderness, or signs of infection (pain, swelling, redness, odor or green/yellow discharge around incision site)      Call MD for:  difficulty breathing, headache or visual disturbances      Discharge instructions      Comments:   No bending, twisting or lifting         Signed: Mileidy Atkin S 09/03/2012, 10:13 AM

## 2012-09-20 ENCOUNTER — Telehealth: Payer: Self-pay | Admitting: Pulmonary Disease

## 2012-09-20 NOTE — Telephone Encounter (Signed)
appt set for 10-06-12. Pt last seen 2011 so needs appt before order can be sent. Carron Curie, CMA

## 2012-10-06 ENCOUNTER — Ambulatory Visit (INDEPENDENT_AMBULATORY_CARE_PROVIDER_SITE_OTHER): Payer: Medicare Other | Admitting: Pulmonary Disease

## 2012-10-06 ENCOUNTER — Encounter: Payer: Self-pay | Admitting: Pulmonary Disease

## 2012-10-06 VITALS — BP 132/70 | HR 82 | Temp 97.4°F | Ht 70.5 in | Wt 261.0 lb

## 2012-10-06 DIAGNOSIS — G4733 Obstructive sleep apnea (adult) (pediatric): Secondary | ICD-10-CM

## 2012-10-06 NOTE — Assessment & Plan Note (Signed)
He is compliant with CPAP and reports benefit.  Will arrange for new CPAP machine.  Will get download after two weeks use on new machine and call him with results.

## 2012-10-06 NOTE — Patient Instructions (Signed)
Will arrange for new CPAP machine Will get report from new CPAP machine and call with results Follow up in 1 year 

## 2012-10-06 NOTE — Progress Notes (Signed)
Chief Complaint  William Hendrix presents with  . Sleep Apnea    Pt is needing a new CPAP machine.    History of Present Illness: William Hendrix is a 71 y.o. male OSA.  I last saw him 08/25/10.  He has been doing well with CPAP.  He has nasal pillows, and no problems with his mask.  He sleeps for about 8 hours a night.  He does not snore.  He feels rested during the day.  He will occasional nap during the day, but not often.  He had back surgery recently, and was sleeping more after this.  He is no longer taking pain medications.  His current CPAP machine is from 2006.  TESTS: PSG 07/24/99 >> AHI 24, SpO2 73% CPAP 07/22/15 >> CPAP 15 cm H2O CPAP 09/14/10 to 09/28/10>>used on 14 of 14 nights with average 6 hrs 47 min.  Average AHI 0.2 with CPAP 15 cm H2O  Past Medical History  Diagnosis Date  . Obesity   . Edema   . DM (diabetes mellitus)   . Renal insufficiency      CKD... creatinine up to 1.5 in hospital after dye in October, 2011  . Fatigue   . Nephrolithiasis   . Gout   . Skin cancer   . CAD (coronary artery disease)     stent 2000 /  cath,stent patent 2006 / cath 2007,stent probably patenet ,but difficult to assess / cath 07/2010, patent LAD stent with mild instent restenosis, moderagte elevation LVEDP  . Ventral hernia   . Sinusitis   . Back problem   . Hyperlipidemia   . HTN (hypertension)   . Chest pain     See data hospital 07/2010  . Drug therapy     Ntg intolerance  . Positive D dimer     significant elevation ,hospital 07/2010, etiology unclear  . Ejection fraction     55%, 07/2010, mild inferior hypo  . SOB (shortness of breath)     mild restrictive defect on PFTs...likely due to obesity and some volume overload  . Dizziness     Mild positional dizziness May, 2012  . History of pneumonia   . Melanoma     Melanoma removed from the left forearm with wide excision May, 2012  . Pneumonia     hx x4 young  . Sleep apnea     CPAP,  [name] since 2000  . OSA  (obstructive sleep apnea)      CPAP.. Dr. Craige Cotta  . GERD (gastroesophageal reflux disease)   . H/O hiatal hernia   . Arthritis     Past Surgical History  Procedure Date  . Nose surgery   . Cardiac catheterization   . Coronary stent placement 2000    CAD  . Knee arthroscopy 90's    rt  . Cervical disc surgery 90's  . Lumbar laminectomy/decompression microdiscectomy 08/30/2012    Procedure: LUMBAR LAMINECTOMY/DECOMPRESSION MICRODISCECTOMY 2 LEVELS;  Surgeon: Barnett Abu, MD;  Location: [facility];  Service: Neurosurgery;  Laterality: Bilateral;  Bilateral Lumbar three-four Lumbar four-five Laminotomies    Outpatient Encounter Prescriptions as of 10/06/2012  Medication Sig Dispense Refill  . allopurinol (ZYLOPRIM) 300 MG tablet Take 300 mg by mouth daily.      Marland Kitchen aspirin EC 81 MG tablet Take 81 mg by mouth daily.      Marland Kitchen atenolol (TENORMIN) 50 MG tablet Take 50 mg by mouth daily.      . Cholecalciferol (VITAMIN D) 1000 UNITS  capsule Take 1,000 Units by mouth at bedtime.       . fenofibrate 160 MG tablet Take 160 mg by mouth 2 (two) times daily.      . fluocinonide cream (LIDEX) 0.05 % Apply 1 application topically daily as needed. FOR RASH      . furosemide (LASIX) 80 MG tablet Take 80 mg by mouth daily.      Marland Kitchen glimepiride (AMARYL) 2 MG tablet Take 2 mg by mouth 2 (two) times daily.      . nitroGLYCERIN (NITROSTAT) 0.4 MG SL tablet Place 0.4 mg under the tongue every 5 (five) minutes as needed. FOR CHEST PAIN      . ranitidine (ZANTAC) 75 MG tablet Take 75 mg by mouth every morning.      . [DISCONTINUED] gabapentin (NEURONTIN) 300 MG capsule Take 300 mg by mouth 3 (three) times daily.      . [DISCONTINUED] oxyCODONE-acetaminophen (PERCOCET/ROXICET) 5-325 MG per tablet Take 2 tablets by mouth every 6 (six) hours as needed for pain.  90 tablet  0    Allergies  Allergen Reactions  . Codeine Other (See Comments)    GI UPSET & TREMORS    Physical Exam:  Filed Vitals:   10/06/12  1537  BP: 132/70  Pulse: 82  Temp: 97.4 F (36.3 C)  TempSrc: Oral  Height: 5' 10.5" (1.791 m)  Weight: 261 lb (118.389 kg)  SpO2: 96%    Body mass index is 36.92 kg/(m^2).   Wt Readings from Last 2 Encounters:  10/06/12 261 lb (118.389 kg)  08/29/12 257 lb (116.574 kg)     General - No distress ENT - No sinus tenderness, MP 4, no oral exudate, no LAN Cardiac - s1s2 regular, no murmur Chest - No wheeze/rales/dullness Back - No focal tenderness Abd - Soft, non-tender Ext - No edema Neuro - Normal strength Skin - No rashes Psych - normal mood, and behavior   Assessment/Plan:  William Helling, MD [place] [facility] Pager:  [phone] 10/06/2012, 3:39 PM

## 2012-11-02 ENCOUNTER — Telehealth: Payer: Self-pay | Admitting: Cardiology

## 2012-11-02 ENCOUNTER — Other Ambulatory Visit: Payer: Self-pay | Admitting: *Deleted

## 2012-11-02 MED ORDER — FENOFIBRATE 160 MG PO TABS: 160.0000 mg | ORAL_TABLET | Freq: Two times a day (BID) | ORAL | Status: DC

## 2012-11-02 MED ORDER — ALLOPURINOL 300 MG PO TABS: 300.0000 mg | ORAL_TABLET | Freq: Every day | ORAL | Status: DC

## 2012-11-02 MED ORDER — ATENOLOL 50 MG PO TABS: 50.0000 mg | ORAL_TABLET | Freq: Every day | ORAL | Status: DC

## 2012-11-02 MED ORDER — GLIMEPIRIDE 2 MG PO TABS: 2.0000 mg | ORAL_TABLET | Freq: Two times a day (BID) | ORAL | Status: DC

## 2012-11-02 MED ORDER — FUROSEMIDE 80 MG PO TABS: 80.0000 mg | ORAL_TABLET | Freq: Every day | ORAL | Status: DC

## 2012-11-02 NOTE — Telephone Encounter (Signed)
New problem :   Prime mail    Atenolol  50 mg, fenofibrate 160 mg furosemide  80 mg

## 2012-11-03 ENCOUNTER — Telehealth: Payer: Self-pay | Admitting: Pulmonary Disease

## 2012-11-03 NOTE — Telephone Encounter (Signed)
CPAP 10/10/12 to 10/23/12 >> Used on 14 of 14 nights with average 8 hrs 4 min.  Average AHI 0.5 with CPAP 15 cm H2O.  Will have my nurse inform pt that CPAP report looked very good.  No change to current set up needed.

## 2012-11-09 NOTE — Telephone Encounter (Signed)
I spoke with patient about results and he verbalized understanding and had no questions 

## 2012-11-14 ENCOUNTER — Telehealth: Payer: Self-pay | Admitting: Cardiology

## 2012-11-14 NOTE — Telephone Encounter (Signed)
New problem    Need a cheaper alternative for fenofibrate 160 mg.

## 2012-11-16 NOTE — Telephone Encounter (Signed)
William Hendrix states that his insurance company will no longer cover the fenofibrate and he wants to know if Dr Myrtis Ser can prescribe a cheaper alternative?

## 2012-11-16 NOTE — Telephone Encounter (Signed)
Pt spouse will not be home and wants to let you know the insurance company will be faxing a form to you for and exception

## 2012-11-18 NOTE — Telephone Encounter (Signed)
First of all there is no cheaper substitute. Secondly this is the drug of choice for his high triglycerides. It is inappropriate for the insurance company not to cover this medication. If there is a form that I need to fill out to convince the insurance company to get him this medication, I will be happy to do so.

## 2012-11-23 NOTE — Telephone Encounter (Signed)
Form was filled out and approved.  Pt was notified.

## 2012-11-28 ENCOUNTER — Encounter: Payer: Self-pay | Admitting: Cardiology

## 2012-11-28 ENCOUNTER — Ambulatory Visit (INDEPENDENT_AMBULATORY_CARE_PROVIDER_SITE_OTHER): Payer: Medicare Other | Admitting: Cardiology

## 2012-11-28 VITALS — BP 112/62 | HR 80 | Ht 70.5 in | Wt 257.0 lb

## 2012-11-28 DIAGNOSIS — M48061 Spinal stenosis, lumbar region without neurogenic claudication: Secondary | ICD-10-CM

## 2012-11-28 DIAGNOSIS — I2510 Atherosclerotic heart disease of native coronary artery without angina pectoris: Secondary | ICD-10-CM

## 2012-11-28 DIAGNOSIS — I10 Essential (primary) hypertension: Secondary | ICD-10-CM

## 2012-11-28 NOTE — Progress Notes (Signed)
Patient ID: William Hendrix, male   DOB: 11/07/1940, 72 y.o.   MRN: [id number]   HPI  Patient is seen to follow up coronary disease. I saw him last November, 2013. Since that time he is said surgery for spinal stenosis by Dr. Danielle Dess. He's also had his CPAP adjusted by Dr. Craige Cotta. He's doing much better. He's not having any chest pain. His last cath was 2011. He was stable.  Allergies  Allergen Reactions  . Codeine Other (See Comments)    GI UPSET & TREMORS    Current Outpatient Prescriptions  Medication Sig Dispense Refill  . allopurinol (ZYLOPRIM) 300 MG tablet Take 1 tablet (300 mg total) by mouth daily.  90 tablet  1  . aspirin EC 81 MG tablet Take 81 mg by mouth daily.      Marland Kitchen atenolol (TENORMIN) 50 MG tablet Take 1 tablet (50 mg total) by mouth daily.  90 tablet  3  . Cholecalciferol (VITAMIN D) 1000 UNITS capsule Take 1,000 Units by mouth at bedtime.       . fenofibrate 160 MG tablet Take 1 tablet (160 mg total) by mouth 2 (two) times daily.  180 tablet  3  . fluocinonide cream (LIDEX) 0.05 % Apply 1 application topically daily as needed. FOR RASH      . furosemide (LASIX) 80 MG tablet Take 1 tablet (80 mg total) by mouth daily.  90 tablet  3  . glimepiride (AMARYL) 2 MG tablet Take 1 tablet (2 mg total) by mouth 2 (two) times daily.  180 tablet  1  . nitroGLYCERIN (NITROSTAT) 0.4 MG SL tablet Place 0.4 mg under the tongue every 5 (five) minutes as needed. FOR CHEST PAIN      . pantoprazole (PROTONIX) 20 MG tablet Take 20 mg by mouth daily.       No current facility-administered medications for this visit.    History   Social History  . Marital Status: Married    Spouse Name: N/A    Number of Children: N/A  . Years of Education: N/A   Occupational History  . [employer]    Social History Main Topics  . Smoking status: Former Smoker -- 3.00 packs/day for 30 years    Quit date: 09/17/1996  . Smokeless tobacco: Not on file     Comment: occ drink  . Alcohol Use: Yes  . Drug Use:  No  . Sexually Active: Not on file   Other Topics Concern  . Not on file   Social History Narrative   Married    Family History  Problem Relation Age of Onset  . Prostate cancer    . Kidney cancer    . Cancer      Bladder cancer  . Coronary artery disease      Past Medical History  Diagnosis Date  . Obesity   . Edema   . DM (diabetes mellitus)   . Renal insufficiency      CKD... creatinine up to 1.5 in hospital after dye in October, 2011  . Fatigue   . Nephrolithiasis   . Gout   . Skin cancer   . CAD (coronary artery disease)     stent 2000 /  cath,stent patent 2006 / cath 2007,stent probably patenet ,but difficult to assess / cath 07/2010, patent LAD stent with mild instent restenosis, moderagte elevation LVEDP  . Ventral hernia   . Sinusitis   . Back problem   . Hyperlipidemia   . HTN (hypertension)   .  Chest pain     See data hospital 07/2010  . Drug therapy     Ntg intolerance  . Positive D dimer     significant elevation ,hospital 07/2010, etiology unclear  . Ejection fraction     55%, 07/2010, mild inferior hypo  . SOB (shortness of breath)     mild restrictive defect on PFTs...likely due to obesity and some volume overload  . Dizziness     Mild positional dizziness May, 2012  . History of pneumonia   . Melanoma     Melanoma removed from the left forearm with wide excision May, 2012  . Pneumonia     hx x4 young  . Sleep apnea     CPAP,  [name] since 2000  . OSA (obstructive sleep apnea)      CPAP.. Dr. Craige Cotta  . GERD (gastroesophageal reflux disease)   . H/O hiatal hernia   . Arthritis     Past Surgical History  Procedure Laterality Date  . Nose surgery    . Cardiac catheterization    . Coronary stent placement  2000    CAD  . Knee arthroscopy  90's    rt  . Cervical disc surgery  90's  . Lumbar laminectomy/decompression microdiscectomy  08/30/2012    Procedure: LUMBAR LAMINECTOMY/DECOMPRESSION MICRODISCECTOMY 2 LEVELS;  Surgeon: Barnett Abu, MD;  Location: [facility];  Service: Neurosurgery;  Laterality: Bilateral;  Bilateral Lumbar three-four Lumbar four-five Laminotomies    Patient Active Problem List  Diagnosis  . DIABETES MELLITUS, TYPE II  . GOUT  . OVERWEIGHT/OBESITY  . DEPRESSION  . VENTRAL HERNIA  . HIATAL HERNIA  . FATIGUE  . PARESTHESIA  . EDEMA  . OTHER ABNORMAL BLOOD CHEMISTRY  . SKIN CANCER, HX OF  . NEPHROLITHIASIS, HX OF  . Renal insufficiency  . CAD (coronary artery disease)  . Hyperlipidemia  . HTN (hypertension)  . OSA (obstructive sleep apnea)  . Drug therapy  . Positive D dimer  . Ejection fraction  . SOB (shortness of breath)  . Dizziness  . Melanoma  . Spinal stenosis of lumbar region at multiple levels  . Diarrhea  . Hypertriglyceridemia  . Preop cardiovascular exam    ROS   Patient denies fever, chills, headache, sweats, rash, change in vision, change in hearing, chest pain, cough, nausea vomiting, urinary symptoms. All other systems are reviewed and are negative.  PHYSICAL EXAM  Patient has lost a few pounds since I saw him last. He is overweight. He is here with his wife. There is no jugulovenous distention. Lungs are clear. Respiratory effort is nonlabored. Cardiac exam reveals S1-S2. There no clicks or significant murmurs. The abdomen is protuberant but soft. Is no peripheral edema.  Filed Vitals:   11/28/12 1344  BP: 112/62  Pulse: 80  Height: 5' 10.5" (1.791 m)  Weight: 257 lb (116.574 kg)     ASSESSMENT & PLAN

## 2012-11-28 NOTE — Assessment & Plan Note (Signed)
Coronary disease is stable. No change in therapy. 

## 2012-11-28 NOTE — Patient Instructions (Addendum)
Your physician wants you to follow-up in:  6 months. You will receive a reminder letter in the mail two months in advance. If you don't receive a letter, please call our office to schedule the follow-up appointment.   

## 2012-11-28 NOTE — Assessment & Plan Note (Signed)
Blood pressure stable. No change in therapy. 

## 2012-12-06 ENCOUNTER — Encounter: Payer: Self-pay | Admitting: Internal Medicine

## 2012-12-06 ENCOUNTER — Ambulatory Visit (INDEPENDENT_AMBULATORY_CARE_PROVIDER_SITE_OTHER): Payer: Medicare Other | Admitting: Internal Medicine

## 2012-12-06 VITALS — BP 112/64 | HR 74 | Temp 98.0°F | Resp 10 | Wt 254.0 lb

## 2012-12-06 DIAGNOSIS — E119 Type 2 diabetes mellitus without complications: Secondary | ICD-10-CM

## 2012-12-06 DIAGNOSIS — J069 Acute upper respiratory infection, unspecified: Secondary | ICD-10-CM

## 2012-12-06 MED ORDER — AMOXICILLIN 875 MG PO TABS: 875.0000 mg | ORAL_TABLET | Freq: Two times a day (BID) | ORAL | Status: DC

## 2012-12-06 NOTE — Progress Notes (Signed)
Subjective:    Patient ID: William Hendrix, male    DOB: 10-31-1940, 72 y.o.   MRN: [id number]  HPI Mr. brannigan presents for an URI: started about 2 weeks ago, responded to OTC medication but then recurred. He has a sore throat and sore ears. He has been hoarse. Has had symptoms of minor low grade fever.  Since his surgery he has been doing well. Walking w/o difficulty. Since hospitalization is blood sugars have been poorly controlled in the  200's on amaryl alone.   Past Medical History  Diagnosis Date  . Obesity   . Edema   . DM (diabetes mellitus)   . Renal insufficiency      CKD... creatinine up to 1.5 in hospital after dye in October, 2011  . Fatigue   . Nephrolithiasis   . Gout   . Skin cancer   . CAD (coronary artery disease)     stent 2000 /  cath,stent patent 2006 / cath 2007,stent probably patenet ,but difficult to assess / cath 07/2010, patent LAD stent with mild instent restenosis, moderagte elevation LVEDP  . Ventral hernia   . Sinusitis   . Back problem   . Hyperlipidemia   . HTN (hypertension)   . Chest pain     See data hospital 07/2010  . Drug therapy     Ntg intolerance  . Positive D dimer     significant elevation ,hospital 07/2010, etiology unclear  . Ejection fraction     55%, 07/2010, mild inferior hypo  . SOB (shortness of breath)     mild restrictive defect on PFTs...likely due to obesity and some volume overload  . Dizziness     Mild positional dizziness May, 2012  . History of pneumonia   . Melanoma     Melanoma removed from the left forearm with wide excision May, 2012  . Pneumonia     hx x4 young  . Sleep apnea     CPAP,  [name] since 2000  . OSA (obstructive sleep apnea)      CPAP.. Dr. Craige Cotta  . GERD (gastroesophageal reflux disease)   . H/O hiatal hernia   . Arthritis    Past Surgical History  Procedure Laterality Date  . Nose surgery    . Cardiac catheterization    . Coronary stent placement  2000    CAD  . Knee arthroscopy   90's    rt  . Cervical disc surgery  90's  . Lumbar laminectomy/decompression microdiscectomy  08/30/2012    Procedure: LUMBAR LAMINECTOMY/DECOMPRESSION MICRODISCECTOMY 2 LEVELS;  Surgeon: Barnett Abu, MD;  Location: [facility];  Service: Neurosurgery;  Laterality: Bilateral;  Bilateral Lumbar three-four Lumbar four-five Laminotomies   Family History  Problem Relation Age of Onset  . Prostate cancer    . Kidney cancer    . Cancer      Bladder cancer  . Coronary artery disease     History   Social History  . Marital Status: Married    Spouse Name: N/A    Number of Children: N/A  . Years of Education: N/A   Occupational History  . [employer]    Social History Main Topics  . Smoking status: Former Smoker -- 3.00 packs/day for 30 years    Quit date: 09/17/1996  . Smokeless tobacco: Not on file     Comment: occ drink  . Alcohol Use: Yes  . Drug Use: No  . Sexually Active: Not on file   Other Topics  Concern  . Not on file   Social History Narrative   Married    Current Outpatient Prescriptions on File Prior to Visit  Medication Sig Dispense Refill  . allopurinol (ZYLOPRIM) 300 MG tablet Take 1 tablet (300 mg total) by mouth daily.  90 tablet  1  . aspirin EC 81 MG tablet Take 81 mg by mouth daily.      Marland Kitchen atenolol (TENORMIN) 50 MG tablet Take 1 tablet (50 mg total) by mouth daily.  90 tablet  3  . Cholecalciferol (VITAMIN D) 1000 UNITS capsule Take 1,000 Units by mouth at bedtime.       . fluocinonide cream (LIDEX) 0.05 % Apply 1 application topically daily as needed. FOR RASH      . furosemide (LASIX) 80 MG tablet Take 1 tablet (80 mg total) by mouth daily.  90 tablet  3  . glimepiride (AMARYL) 2 MG tablet Take 1 tablet (2 mg total) by mouth 2 (two) times daily.  180 tablet  1  . nitroGLYCERIN (NITROSTAT) 0.4 MG SL tablet Place 0.4 mg under the tongue every 5 (five) minutes as needed. FOR CHEST PAIN      . pantoprazole (PROTONIX) 20 MG tablet Take 20 mg by mouth daily.        No current facility-administered medications on file prior to visit.     Review of Systems System review is negative for any constitutional, cardiac, pulmonary, GI or neuro symptoms or complaints other than as described in the HPI.    Objective:   Physical Exam Filed Vitals:   12/06/12 1035  BP: 112/64  Pulse: 74  Temp: 98 F (36.7 C)  Resp: 10   Gen'l- WNWD overweight  HEENT- TM's normal, throat with erythema and exudate Nodes - negative Cor- RRR Pulm - CTAP       Assessment & Plan:  URI - Amoxicillin 875 mg bid x 7 days  Supportive care.

## 2012-12-06 NOTE — Assessment & Plan Note (Signed)
Poor control on amaryl.  Plan stop Amaryl  Start Januvia 100 mg once a day. Track your sugars and let me know (using MyChart)

## 2012-12-06 NOTE — Patient Instructions (Addendum)
Sore throat = URI.  Plan Amoxicillin twice a day for seven days  Supportive care: fluids, tylenol, vitamin C, cough syrup of choice sugar free  Daibetes - poor control  Plan - stop Amaryl  Start Januvia 100 mg once a day  Keep record of blood sugars and let me know.  Please sign up for MyChart.   Upper Respiratory Infection, Adult An upper respiratory infection (URI) is also sometimes known as the common cold. The upper respiratory tract includes the nose, sinuses, throat, trachea, and bronchi. Bronchi are the airways leading to the lungs. Most people improve within 1 week, but symptoms can last up to 2 weeks. A residual cough may last even longer.  CAUSES Many different viruses can infect the tissues lining the upper respiratory tract. The tissues become irritated and inflamed and often become very moist. Mucus production is also common. A cold is contagious. You can easily spread the virus to others by oral contact. This includes kissing, sharing a glass, coughing, or sneezing. Touching your mouth or nose and then touching a surface, which is then touched by another person, can also spread the virus. SYMPTOMS  Symptoms typically develop 1 to 3 days after you come in contact with a cold virus. Symptoms vary from person to person. They may include:  Runny nose.  Sneezing.  Nasal congestion.  Sinus irritation.  Sore throat.  Loss of voice (laryngitis).  Cough.  Fatigue.  Muscle aches.  Loss of appetite.  Headache.  Low-grade fever. DIAGNOSIS  You might diagnose your own cold based on familiar symptoms, since most people get a cold 2 to 3 times a year. Your caregiver can confirm this based on your exam. Most importantly, your caregiver can check that your symptoms are not due to another disease such as strep throat, sinusitis, pneumonia, asthma, or epiglottitis. Blood tests, throat tests, and X-rays are not necessary to diagnose a common cold, but they may sometimes be  helpful in excluding other more serious diseases. Your caregiver will decide if any further tests are required. RISKS AND COMPLICATIONS  You may be at risk for a more severe case of the common cold if you smoke cigarettes, have chronic heart disease (such as heart failure) or lung disease (such as asthma), or if you have a weakened immune system. The very young and very old are also at risk for more serious infections. Bacterial sinusitis, middle ear infections, and bacterial pneumonia can complicate the common cold. The common cold can worsen asthma and chronic obstructive pulmonary disease (COPD). Sometimes, these complications can require emergency medical care and may be life-threatening. PREVENTION  The best way to protect against getting a cold is to practice good hygiene. Avoid oral or hand contact with people with cold symptoms. Wash your hands often if contact occurs. There is no clear evidence that vitamin C, vitamin E, echinacea, or exercise reduces the chance of developing a cold. However, it is always recommended to get plenty of rest and practice good nutrition. TREATMENT  Treatment is directed at relieving symptoms. There is no cure. Antibiotics are not effective, because the infection is caused by a virus, not by bacteria. Treatment may include:  Increased fluid intake. Sports drinks offer valuable electrolytes, sugars, and fluids.  Breathing heated mist or steam (vaporizer or shower).  Eating chicken soup or other clear broths, and maintaining good nutrition.  Getting plenty of rest.  Using gargles or lozenges for comfort.  Controlling fevers with ibuprofen or acetaminophen as directed by your  caregiver.  Increasing usage of your inhaler if you have asthma. Zinc gel and zinc lozenges, taken in the first 24 hours of the common cold, can shorten the duration and lessen the severity of symptoms. Pain medicines may help with fever, muscle aches, and throat pain. A variety of  non-prescription medicines are available to treat congestion and runny nose. Your caregiver can make recommendations and may suggest nasal or lung inhalers for other symptoms.  HOME CARE INSTRUCTIONS   Only take over-the-counter or prescription medicines for pain, discomfort, or fever as directed by your caregiver.  Use a warm mist humidifier or inhale steam from a shower to increase air moisture. This may keep secretions moist and make it easier to breathe.  Drink enough water and fluids to keep your urine clear or pale yellow.  Rest as needed.  Return to work when your temperature has returned to normal or as your caregiver advises. You may need to stay home longer to avoid infecting others. You can also use a face mask and careful hand washing to prevent spread of the virus. SEEK MEDICAL CARE IF:   After the first few days, you feel you are getting worse rather than better.  You need your caregiver's advice about medicines to control symptoms.  You develop chills, worsening shortness of breath, or brown or red sputum. These may be signs of pneumonia.  You develop yellow or brown nasal discharge or pain in the face, especially when you bend forward. These may be signs of sinusitis.  You develop a fever, swollen neck glands, pain with swallowing, or white areas in the back of your throat. These may be signs of strep throat. SEEK IMMEDIATE MEDICAL CARE IF:   You have a fever.  You develop severe or persistent headache, ear pain, sinus pain, or chest pain.  You develop wheezing, a prolonged cough, cough up blood, or have a change in your usual mucus (if you have chronic lung disease).  You develop sore muscles or a stiff neck. Document Released: 03/24/2001 Document Revised: 12/21/2011 Document Reviewed: 01/30/2011 [facility] Patient Information 2013 [place], [place].

## 2012-12-10 ENCOUNTER — Encounter: Payer: Self-pay | Admitting: Internal Medicine

## 2012-12-12 ENCOUNTER — Encounter: Payer: Self-pay | Admitting: Internal Medicine

## 2012-12-20 ENCOUNTER — Encounter: Payer: Self-pay | Admitting: Internal Medicine

## 2012-12-20 ENCOUNTER — Telehealth: Payer: Self-pay | Admitting: Internal Medicine

## 2012-12-20 ENCOUNTER — Ambulatory Visit (INDEPENDENT_AMBULATORY_CARE_PROVIDER_SITE_OTHER): Payer: Medicare Other | Admitting: Internal Medicine

## 2012-12-20 VITALS — BP 112/60 | HR 72 | Temp 98.0°F | Wt 252.0 lb

## 2012-12-20 DIAGNOSIS — E119 Type 2 diabetes mellitus without complications: Secondary | ICD-10-CM

## 2012-12-20 MED ORDER — SITAGLIPTIN PHOSPHATE 100 MG PO TABS: 100.0000 mg | ORAL_TABLET | Freq: Every day | ORAL | Status: DC

## 2012-12-20 MED ORDER — INSULIN PEN NEEDLE 31G X 8 MM MISC: Status: DC

## 2012-12-20 MED ORDER — INSULIN DETEMIR 100 UNIT/ML ~~LOC~~ SOLN: 50.0000 [IU] | Freq: Every day | SUBCUTANEOUS | Status: DC

## 2012-12-20 NOTE — Patient Instructions (Addendum)
Need to get diabetes under better control. It is time to start basal insulin therapy - one shot a day that keeps a level of insulin in the blood stream 24 hours.  Plan Levemir - start at 25 units injection once a day  Titration schedule: check blood sugar every morning before eating.  If the blood sugar is 150 or more three mornings in a row increase the Levemir by 3 unit, i.e go from 25 to 28 units. Next cycle you may go from 28 to 31u  If you get to 50 units daily and are still not controlled we will consider split dosing.  Continue to take the [place].

## 2012-12-20 NOTE — Telephone Encounter (Signed)
Patient Information:  Caller Name: N/A  Phone: [phone]  Patient: William Hendrix, William Hendrix  Gender: Male  DOB: 1940/12/02  Age: 72 Years  PCP: [name] (Adults only)  Office Follow Up:  Does the office need to follow up with this patient?: No  Instructions For The Office: N/A  RN Note:  Blood sugars up to 300 12/08/12.  Finished antibiotic 12/13/12; had been told to give his body a week after finishing antibiotic for it to adjust on its own with Januvia.  States blood sugar 12/14/12 AM 291; PM 297.  12/15/12 AM 302; PM 328.  Blood sugars were skipped 12/16/12.  12/17/12 AM 273; PM 315.  12/18/12 AM 295; PM 363.  12/19/12 AM 315; PM 400.  12/20/12 AM 287.  AM readings are before breakfast; PM readings are before supper.  Per diabetes protocol, emergent symptoms currently denied; advised appt, as last Epic entry by Dr. Debby Bud advised appt for medication management.  Appt scheduled 12/20/12 1500 x 30 minutes with Dr. Debby Bud.  krs/can  Symptoms  Reason For Call & Symptoms: seen by Dr. Debby Bud but blood sugars are worse.  Placed on Januvia 12/07/12; family has been sending daily blood sugars to office; has been on antibiotics.  Was told to wait a week after the antibiotics were finished; Januvia samples will run out 12/21/12.  Reviewed Health History In EMR: Yes  Reviewed Medications In EMR: Yes  Reviewed Allergies In EMR: Yes  Reviewed Surgeries / Procedures: Yes  Date of Onset of Symptoms: Unknown  Guideline(s) Used:  Diabetes - High Blood Sugar  Disposition Per Guideline:   Discuss with PCP and Callback by Nurse within 1 Hour  Reason For Disposition Reached:   Blood glucose > 300 mg/dl (65.7 mmol/l) AND two or more times in a row  Advice Given:  N/A  RN Overrode Recommendation:  Make Appointment  appt scheduled krs/can  Appointment Scheduled:  12/20/2012 15:00:00 Appointment Scheduled Provider:  [name] (Adults only)

## 2012-12-21 NOTE — Progress Notes (Signed)
  Subjective:    Patient ID: William Hendrix, male    DOB: 08-22-1941, 72 y.o.   MRN: [id number]  HPI William Hendrix presents for diabetes management. His CBGs on Januvia 100 mg q d have been out of control.  PMH, FamHx and SocHx reviewed for any changes and relevance.  Current Outpatient Prescriptions on File Prior to Visit  Medication Sig Dispense Refill  . allopurinol (ZYLOPRIM) 300 MG tablet Take 1 tablet (300 mg total) by mouth daily.  90 tablet  1  . aspirin EC 81 MG tablet Take 81 mg by mouth daily.      Marland Kitchen atenolol (TENORMIN) 50 MG tablet Take 1 tablet (50 mg total) by mouth daily.  90 tablet  3  . Cholecalciferol (VITAMIN D) 1000 UNITS capsule Take 1,000 Units by mouth at bedtime.       . fenofibrate 160 MG tablet Take 160 mg by mouth daily.      . fluocinonide cream (LIDEX) 0.05 % Apply 1 application topically daily as needed. FOR RASH      . furosemide (LASIX) 80 MG tablet Take 1 tablet (80 mg total) by mouth daily.  90 tablet  3  . nitroGLYCERIN (NITROSTAT) 0.4 MG SL tablet Place 0.4 mg under the tongue every 5 (five) minutes as needed. FOR CHEST PAIN      . pantoprazole (PROTONIX) 20 MG tablet Take 20 mg by mouth daily.       No current facility-administered medications on file prior to visit.      Review of Systems System review is negative for any constitutional, cardiac, pulmonary, GI or neuro symptoms or complaints other than as described in the HPI.     Objective:   Physical Exam Filed Vitals:   12/20/12 1507  BP: 112/60  Pulse: 72  Temp: 98 F (36.7 C)   Gen'l- overweight white man in no distress HEENT - C&S clear, normal vision Cor - RRR Pulm - normal respirations Neuro A&O x 3       Assessment & Plan:

## 2012-12-21 NOTE — Assessment & Plan Note (Signed)
Poor control on januvia 100 mg daily with CBG consistently greater than 200 and as high as 400. Discussed methods of treatment  Plan Continue januvia 100 mg daily  Initiate basal insulin therapy: levemir stating at 25 units qD; instructed in administration; instructed in 3 day cycle titration (see AVS). If he reaches 50 units daily he will need to go to split dose or add second oral agent  (greater than 50% of 30 min visit spent on education and counseling)

## 2012-12-22 ENCOUNTER — Telehealth: Payer: Self-pay | Admitting: Internal Medicine

## 2012-12-22 NOTE — Telephone Encounter (Signed)
Pt's spouse called stated that William Hendrix seem to have a red spot on the area where he give himself insulin injection. Pt's spouse is very concern about the red spot and was wondering if they need to continue this medication and order anymore levamire flexpen (very expensive)? Please advise.

## 2012-12-22 NOTE — Telephone Encounter (Signed)
Red spot is not a concern. Please continue levemir as instructed. I would give it  several days as we titrate the dose before making any other changes.

## 2012-12-22 NOTE — Telephone Encounter (Signed)
William Hendrix called back to let us know his BS is 463 at 2:00 this afternoon without eating.  He ate cereal around 10:30 this morning.

## 2012-12-23 NOTE — Telephone Encounter (Signed)
Pt informed.  He will call back next week if still has concerns to make an appt to discuss.

## 2012-12-24 ENCOUNTER — Encounter (HOSPITAL_BASED_OUTPATIENT_CLINIC_OR_DEPARTMENT_OTHER): Payer: Self-pay | Admitting: *Deleted

## 2012-12-24 ENCOUNTER — Emergency Department (HOSPITAL_BASED_OUTPATIENT_CLINIC_OR_DEPARTMENT_OTHER)
Admission: EM | Admit: 2012-12-24 | Discharge: 2012-12-25 | Disposition: A | Discharge status: Home / Self Care | Payer: Medicare Other | Attending: Emergency Medicine | Admitting: Emergency Medicine

## 2012-12-24 DIAGNOSIS — E669 Obesity, unspecified: Secondary | ICD-10-CM | POA: Insufficient documentation

## 2012-12-24 DIAGNOSIS — Z8679 Personal history of other diseases of the circulatory system: Secondary | ICD-10-CM | POA: Insufficient documentation

## 2012-12-24 DIAGNOSIS — E1169 Type 2 diabetes mellitus with other specified complication: Secondary | ICD-10-CM | POA: Insufficient documentation

## 2012-12-24 DIAGNOSIS — Z79899 Other long term (current) drug therapy: Secondary | ICD-10-CM | POA: Insufficient documentation

## 2012-12-24 DIAGNOSIS — I129 Hypertensive chronic kidney disease with stage 1 through stage 4 chronic kidney disease, or unspecified chronic kidney disease: Secondary | ICD-10-CM | POA: Insufficient documentation

## 2012-12-24 DIAGNOSIS — L270 Generalized skin eruption due to drugs and medicaments taken internally: Secondary | ICD-10-CM | POA: Insufficient documentation

## 2012-12-24 DIAGNOSIS — K219 Gastro-esophageal reflux disease without esophagitis: Secondary | ICD-10-CM | POA: Insufficient documentation

## 2012-12-24 DIAGNOSIS — Z87442 Personal history of urinary calculi: Secondary | ICD-10-CM | POA: Insufficient documentation

## 2012-12-24 DIAGNOSIS — Z9861 Coronary angioplasty status: Secondary | ICD-10-CM | POA: Insufficient documentation

## 2012-12-24 DIAGNOSIS — E785 Hyperlipidemia, unspecified: Secondary | ICD-10-CM | POA: Insufficient documentation

## 2012-12-24 DIAGNOSIS — Z794 Long term (current) use of insulin: Secondary | ICD-10-CM | POA: Insufficient documentation

## 2012-12-24 DIAGNOSIS — T383X5A Adverse effect of insulin and oral hypoglycemic [antidiabetic] drugs, initial encounter: Secondary | ICD-10-CM | POA: Insufficient documentation

## 2012-12-24 DIAGNOSIS — T50905A Adverse effect of unspecified drugs, medicaments and biological substances, initial encounter: Secondary | ICD-10-CM

## 2012-12-24 DIAGNOSIS — Z85820 Personal history of malignant melanoma of skin: Secondary | ICD-10-CM | POA: Insufficient documentation

## 2012-12-24 DIAGNOSIS — R739 Hyperglycemia, unspecified: Secondary | ICD-10-CM

## 2012-12-24 DIAGNOSIS — G4733 Obstructive sleep apnea (adult) (pediatric): Secondary | ICD-10-CM | POA: Insufficient documentation

## 2012-12-24 DIAGNOSIS — N189 Chronic kidney disease, unspecified: Secondary | ICD-10-CM | POA: Insufficient documentation

## 2012-12-24 DIAGNOSIS — Z9889 Other specified postprocedural states: Secondary | ICD-10-CM | POA: Insufficient documentation

## 2012-12-24 DIAGNOSIS — Z85828 Personal history of other malignant neoplasm of skin: Secondary | ICD-10-CM | POA: Insufficient documentation

## 2012-12-24 DIAGNOSIS — Z8719 Personal history of other diseases of the digestive system: Secondary | ICD-10-CM | POA: Insufficient documentation

## 2012-12-24 DIAGNOSIS — Z8709 Personal history of other diseases of the respiratory system: Secondary | ICD-10-CM | POA: Insufficient documentation

## 2012-12-24 DIAGNOSIS — Z7982 Long term (current) use of aspirin: Secondary | ICD-10-CM | POA: Insufficient documentation

## 2012-12-24 DIAGNOSIS — I2510 Atherosclerotic heart disease of native coronary artery without angina pectoris: Secondary | ICD-10-CM | POA: Insufficient documentation

## 2012-12-24 DIAGNOSIS — Z8669 Personal history of other diseases of the nervous system and sense organs: Secondary | ICD-10-CM | POA: Insufficient documentation

## 2012-12-24 DIAGNOSIS — M109 Gout, unspecified: Secondary | ICD-10-CM | POA: Insufficient documentation

## 2012-12-24 DIAGNOSIS — Z87891 Personal history of nicotine dependence: Secondary | ICD-10-CM | POA: Insufficient documentation

## 2012-12-24 LAB — GLUCOSE, CAPILLARY: Glucose-Capillary: 348 mg/dL — ABNORMAL HIGH (ref 70–99)

## 2012-12-24 NOTE — ED Notes (Signed)
CBG taken with result of 348 mg.?dcltr.

## 2012-12-24 NOTE — ED Notes (Deleted)
Pt states she is a drinker and last Thursday began coughing up blood and nose started bleeding. Also c/o rectal bleeding and abd pain

## 2012-12-24 NOTE — ED Notes (Signed)
Pt started levimir injections on wed- pt has red,  hot patches at injection sites

## 2012-12-25 LAB — CBC
HCT: 40.3 % (ref 39.0–52.0)
Hemoglobin: 14.3 g/dL (ref 13.0–17.0)
MCHC: 35.5 g/dL (ref 30.0–36.0)
WBC: 7.4 10*3/uL (ref 4.0–10.5)

## 2012-12-25 LAB — BASIC METABOLIC PANEL
BUN: 28 mg/dL — ABNORMAL HIGH (ref 6–23)
Chloride: 97 mEq/L (ref 96–112)
Glucose, Bld: 362 mg/dL — ABNORMAL HIGH (ref 70–99)
Potassium: 3.8 mEq/L (ref 3.5–5.1)

## 2012-12-25 LAB — GLUCOSE, CAPILLARY: Glucose-Capillary: 229 mg/dL — ABNORMAL HIGH (ref 70–99)

## 2012-12-25 MED ORDER — SODIUM CHLORIDE 0.9 % IV BOLUS (SEPSIS)
1000.0000 mL | Freq: Once | INTRAVENOUS | Status: AC
  Administered 2012-12-25: 1000 mL via INTRAVENOUS

## 2012-12-25 MED ORDER — INSULIN ASPART 100 UNIT/ML ~~LOC~~ SOLN
10.0000 [IU] | Freq: Once | SUBCUTANEOUS | Status: AC
  Administered 2012-12-25: 10 [IU] via SUBCUTANEOUS
  Filled 2012-12-25: qty 3

## 2012-12-25 NOTE — ED Provider Notes (Signed)
History     CSN: [id number]  Arrival date & time 12/24/12  2233   First MD Initiated Contact with Patient 12/25/12 0107      Chief Complaint  Patient presents with  . Abdominal Pain    (Consider location/radiation/quality/duration/timing/severity/associated sxs/prior treatment) HPI Pt presents with areas of redness over abdomen at the site of levemir injections which he began 3-4 days ago.  Has had 4 injections and each of the sites he developed erythema- areas are coalescing now as he spread the injection sites around abdomen.  No fever, no vomiting, no difficulty breathing.  No itching or hives, no lip or tongue swelling.  He contacted his PMD who advised not to worry about the areas of redness.  There are no other associated systemic symptoms, there are no other alleviating or modifying factors.   Past Medical History  Diagnosis Date  . Obesity   . Edema   . DM (diabetes mellitus)   . Renal insufficiency      CKD... creatinine up to 1.5 in hospital after dye in October, 2011  . Fatigue   . Nephrolithiasis   . Gout   . Skin cancer   . CAD (coronary artery disease)     stent 2000 /  cath,stent patent 2006 / cath 2007,stent probably patenet ,but difficult to assess / cath 07/2010, patent LAD stent with mild instent restenosis, moderagte elevation LVEDP  . Ventral hernia   . Sinusitis   . Back problem   . Hyperlipidemia   . HTN (hypertension)   . Chest pain     See data hospital 07/2010  . Drug therapy     Ntg intolerance  . Positive D dimer     significant elevation ,hospital 07/2010, etiology unclear  . Ejection fraction     55%, 07/2010, mild inferior hypo  . SOB (shortness of breath)     mild restrictive defect on PFTs...likely due to obesity and some volume overload  . Dizziness     Mild positional dizziness May, 2012  . History of pneumonia   . Melanoma     Melanoma removed from the left forearm with wide excision May, 2012  . Pneumonia     hx x4 young  .  Sleep apnea     CPAP,  [name] since 2000  . OSA (obstructive sleep apnea)      CPAP.. Dr. Craige Cotta  . GERD (gastroesophageal reflux disease)   . H/O hiatal hernia   . Arthritis     Past Surgical History  Procedure Laterality Date  . Nose surgery    . Cardiac catheterization    . Coronary stent placement  2000    CAD  . Knee arthroscopy  90's    rt  . Cervical disc surgery  90's  . Lumbar laminectomy/decompression microdiscectomy  08/30/2012    Procedure: LUMBAR LAMINECTOMY/DECOMPRESSION MICRODISCECTOMY 2 LEVELS;  Surgeon: Barnett Abu, MD;  Location: [facility];  Service: Neurosurgery;  Laterality: Bilateral;  Bilateral Lumbar three-four Lumbar four-five Laminotomies    Family History  Problem Relation Age of Onset  . Prostate cancer    . Kidney cancer    . Cancer      Bladder cancer  . Coronary artery disease      History  Substance Use Topics  . Smoking status: Former Smoker -- 3.00 packs/day for 30 years    Quit date: 09/17/1996  . Smokeless tobacco: Never Used     Comment: occ drink  . Alcohol Use:  No      Review of Systems ROS reviewed and all otherwise negative except for mentioned in HPI  Allergies  Codeine  Home Medications   Current Outpatient Rx  Name  Route  Sig  Dispense  Refill  . allopurinol (ZYLOPRIM) 300 MG tablet   Oral   Take 1 tablet (300 mg total) by mouth daily.   90 tablet   1     PT NEEDS TO SCHEDULE PHYSICAL EXAM   . aspirin EC 81 MG tablet   Oral   Take 81 mg by mouth daily.         Marland Kitchen atenolol (TENORMIN) 50 MG tablet   Oral   Take 1 tablet (50 mg total) by mouth daily.   90 tablet   3   . Cholecalciferol (VITAMIN D) 1000 UNITS capsule   Oral   Take 1,000 Units by mouth at bedtime.          . fenofibrate 160 MG tablet   Oral   Take 160 mg by mouth daily.         . fluocinonide cream (LIDEX) 0.05 %   Topical   Apply 1 application topically daily as needed. FOR RASH         . furosemide (LASIX) 80 MG  tablet   Oral   Take 1 tablet (80 mg total) by mouth daily.   90 tablet   3   . insulin detemir (LEVEMIR FLEXPEN) 100 UNIT/ML injection   Subcutaneous   Inject 50 Units into the skin daily.   4500 mL   3     This is 15 flexpens per 90 days.   . Insulin Pen Needle (1ST CHOICE PEN NEEDLES) 31G X 8 MM MISC      One needle per day   90 each   3   . nitroGLYCERIN (NITROSTAT) 0.4 MG SL tablet   Sublingual   Place 0.4 mg under the tongue every 5 (five) minutes as needed. FOR CHEST PAIN         . pantoprazole (PROTONIX) 20 MG tablet   Oral   Take 20 mg by mouth daily.         . sitaGLIPtin (JANUVIA) 100 MG tablet   Oral   Take 1 tablet (100 mg total) by mouth daily.   90 tablet   3     BP 152/62  Pulse 86  Temp(Src) 98.1 F (36.7 C) (Oral)  SpO2 95% Vitals reviewed Physical Exam Physical Examination: General appearance - alert, well appearing, and in no distress Mental status - alert, oriented to person, place, and time Eyes - no scleral icterus, no conjunctival injection Mouth - mucous membranes moist, pharynx normal without lesions Chest - clear to auscultation, no wheezes, rales or rhonchi, symmetric air entry Heart - normal rate, regular rhythm, normal S1, S2, no murmurs, rubs, clicks or gallops Abdomen - soft, nontender, nondistended, no masses or organomegaly Extremities - peripheral pulses normal, no pedal edema, no clubbing or cyanosis Skin - erythematous patches scattered over abdominal wall surrounding area of injection, no fluctuance or induration, no drainage, no hives or other rashes or lesions  ED Course  Procedures (including critical care time)  Labs Reviewed  GLUCOSE, CAPILLARY - Abnormal; Notable for the following:    Glucose-Capillary 348 (*)    All other components within normal limits  BASIC METABOLIC PANEL - Abnormal; Notable for the following:    Glucose, Bld 362 (*)    BUN 28 (*)  Creatinine, Ser 1.80 (*)    GFR calc non Af Amer 36  (*)    GFR calc Af Amer 42 (*)    All other components within normal limits  GLUCOSE, CAPILLARY - Abnormal; Notable for the following:    Glucose-Capillary 302 (*)    All other components within normal limits  GLUCOSE, CAPILLARY - Abnormal; Notable for the following:    Glucose-Capillary 229 (*)    All other components within normal limits  CBC   No results found.   1. Hyperglycemia   2. Medication side effect, initial encounter       MDM  Pt presenting with c/o ares of erythema at sites of injection of new medication, levemir.  Doubt cellulitis given short time frame of reaction just after injection at each of 4 sites, also no elevation in WBC and no fever.  More likely localized reaction.  Pt is hyperglycemic without DKA- treated with IV hydration and insulin in the ED.  Pt will arrange for close follow up with Dr. Debby Bud to discuss medication regimens for hyperglycemia.  There are no other associated systemic symptoms, there are no other alleviating or modifying factors.         Ethelda Chick, MD 12/26/12 [phone]

## 2012-12-25 NOTE — ED Notes (Signed)
I took cgb and got 229 mg./Dcltr.

## 2012-12-25 NOTE — ED Notes (Signed)
I took cbg and got 302 mg./dcltr.

## 2012-12-25 NOTE — ED Notes (Signed)
Pt and wife reports that pain and redden to bilat lower abd sites of injection occurred shortly after beginning levemir injections to those areas. PDA consulted and injection site lipodystrohy pruritus is listed as common side effect.

## 2012-12-26 ENCOUNTER — Telehealth: Payer: Self-pay | Admitting: Internal Medicine

## 2012-12-26 ENCOUNTER — Ambulatory Visit (INDEPENDENT_AMBULATORY_CARE_PROVIDER_SITE_OTHER): Payer: Medicare Other | Admitting: Internal Medicine

## 2012-12-26 ENCOUNTER — Encounter: Payer: Self-pay | Admitting: Internal Medicine

## 2012-12-26 VITALS — BP 120/70 | HR 82 | Temp 97.4°F | Resp 12 | Wt 254.0 lb

## 2012-12-26 DIAGNOSIS — E119 Type 2 diabetes mellitus without complications: Secondary | ICD-10-CM

## 2012-12-26 NOTE — Patient Instructions (Addendum)
1. Diabetes management - this has been difficult problem.   Plan Let's go back to the beginning: resume glimeperide 4 mg once a day as monotherapy (do not take the [place])  If the blood sugars remain high - will add back the the [place]  If the blood sugars remain high will refer to Dr. Farrel Conners- a diabetes specialist  2. Skin reaction from Levemir - not infected with a normal white count  Plan Warm compresses

## 2012-12-26 NOTE — Telephone Encounter (Signed)
[facility] Triage Call Report Triage Record Num: [id number] Operator: Patriciaann Clan Patient Name: William Hendrix Call Date & Time: 12/24/2012 9:21:33PM Patient Phone: [phone] PCP: [name] Patient Gender: Male PCP Fax : [phone] Patient DOB: 21-Dec-1940 Practice Name: [facility] Reason for Call: Caller: [name]/Spouse; PCP: [name] (Adults only); CB#: [phone]; Spouse/[name] states patient was prescribed Levimir FlexPen 12/20/12. Spouse states patient developed swollen, raised red, hot areas in both sides of abdomen at site of injections. Onset with initial injection 12/20/12. States areas are "flaming red" and have become progressively worse. States one area is noted on each side of abdomen. States areas are tender to touch. States area on right side of abdomen is the size of a "softball' and the area on the left side of abdomen is approx. the size of a "baseball." States areas are hard to touch. States red streaks noted extending from areas. No drainage noted. Afebrile. Triage per Puncture Wound Protocol. Disposition of " See Provider within 4 hours" obtained related to positive triage assessment for " Any signs and symptoms of worsening soft tissue infection." Care advice given per guidelines. Spouse advised to apply warm compresses. Advised to have patient evaluated in the ED at [facility]. Spouse verbalizes understanding and agreeable. Spouse states she prefers to take patient to the [facility] ED on [address]. Protocol(s) Used: Abrasions, Lacerations, Puncture Wounds Recommended Outcome per Protocol: See Provider within 4 hours Override Outcome if Used in Protocol: See ED Immediately RN Reason for Override Outcome: Nursing Judgement Used. Reason for Outcome: Any signs and symptoms of worsening soft tissue infection Care Advice: ~ SYMPTOM / CONDITION MANAGEMENT Apply local moist heat (such as a warm, wet wash cloth covered with plastic wrap) to  the area for 15-20 minutes every 2-3 hours while awake

## 2012-12-27 NOTE — Assessment & Plan Note (Addendum)
Very difficult to control since his back surgery. Currently on Januvia 100 mg without good control. Recently started Levemir and had injection site reactions.  1. Diabetes management - this has been difficult problem.   Plan Let's go back to the beginning: resume glimeperide 4 mg once a day as monotherapy (do not take the [place])  If the blood sugars remain high - will add back the the [place]  If the blood sugars remain high will refer to Dr. Farrel Conners- a diabetes specialist  2. Skin reaction from Levemir - not infected with a normal white count  Plan Warm compresses

## 2012-12-27 NOTE — Progress Notes (Signed)
  Subjective:    Patient ID: William Hendrix, male    DOB: Dec 16, 1940, 72 y.o.   MRN: [id number]  HPI Mr. Amborn was recently started on Levemir. 4/4 times with injection he develops induration and erythema with pain at the injection site. He was seen at [facility] ED for this and had no signs of systemic infection. He presents today for adjustment in his DM regimen  PMH, FamHx and SocHx reviewed for any changes and relevance. Current Outpatient Prescriptions on File Prior to Visit  Medication Sig Dispense Refill  . allopurinol (ZYLOPRIM) 300 MG tablet Take 1 tablet (300 mg total) by mouth daily.  90 tablet  1  . aspirin EC 81 MG tablet Take 81 mg by mouth daily.      Marland Kitchen atenolol (TENORMIN) 50 MG tablet Take 1 tablet (50 mg total) by mouth daily.  90 tablet  3  . Cholecalciferol (VITAMIN D) 1000 UNITS capsule Take 1,000 Units by mouth at bedtime.       . fenofibrate 160 MG tablet Take 160 mg by mouth daily.      . fluocinonide cream (LIDEX) 0.05 % Apply 1 application topically daily as needed. FOR RASH      . furosemide (LASIX) 80 MG tablet Take 1 tablet (80 mg total) by mouth daily.  90 tablet  3  . insulin detemir (LEVEMIR FLEXPEN) 100 UNIT/ML injection Inject 50 Units into the skin daily.  4500 mL  3  . Insulin Pen Needle (1ST CHOICE PEN NEEDLES) 31G X 8 MM MISC One needle per day  90 each  3  . nitroGLYCERIN (NITROSTAT) 0.4 MG SL tablet Place 0.4 mg under the tongue every 5 (five) minutes as needed. FOR CHEST PAIN      . pantoprazole (PROTONIX) 20 MG tablet Take 20 mg by mouth daily.      . sitaGLIPtin (JANUVIA) 100 MG tablet Take 1 tablet (100 mg total) by mouth daily.  90 tablet  3   No current facility-administered medications on file prior to visit.      Review of Systems System review is negative for any constitutional, cardiac, pulmonary, GI or neuro symptoms or complaints other than as described in the HPI.     Objective:   Physical Exam Filed Vitals:   12/26/12 1632   BP: 120/70  Pulse: 82  Temp: 97.4 F (36.3 C)  Resp: 12   Gen'l - heavy-set older man in no distress Derm - across the abdomen there are indurated and erythematous skin changes at injection sites.       Assessment & Plan:

## 2012-12-28 ENCOUNTER — Other Ambulatory Visit: Payer: Self-pay | Admitting: *Deleted

## 2012-12-28 MED ORDER — FENOFIBRATE 160 MG PO TABS: 160.0000 mg | ORAL_TABLET | Freq: Every day | ORAL | Status: DC

## 2012-12-28 NOTE — Telephone Encounter (Signed)
Called patient to verifiy dosage of Fenofibrate. 160 mg once daily by mouth. Patient states called [employer], they do have the correct formulary on file and we do not need to send refill request at this time.   Documenting this in chart for future reference.  William Hendrix [employer]

## 2013-01-03 ENCOUNTER — Other Ambulatory Visit: Payer: Self-pay | Admitting: Internal Medicine

## 2013-01-04 ENCOUNTER — Other Ambulatory Visit: Payer: Self-pay | Admitting: *Deleted

## 2013-01-04 ENCOUNTER — Encounter: Payer: Self-pay | Admitting: Internal Medicine

## 2013-01-04 ENCOUNTER — Other Ambulatory Visit: Payer: Self-pay | Admitting: Internal Medicine

## 2013-01-04 DIAGNOSIS — E119 Type 2 diabetes mellitus without complications: Secondary | ICD-10-CM

## 2013-01-04 MED ORDER — GLUCOSE BLOOD VI STRP: ORAL_STRIP | Status: DC

## 2013-01-04 MED ORDER — FREESTYLE LANCETS MISC: Status: DC

## 2013-01-04 NOTE — Telephone Encounter (Signed)
Please relay the reason for Amoxicillin Rx. Not mentioned in last OV note.

## 2013-01-05 ENCOUNTER — Telehealth: Payer: Self-pay | Admitting: *Deleted

## 2013-01-05 NOTE — Telephone Encounter (Signed)
Called Drenda Freeze back should be checking BS twice a day. Dx code 250.00...lmb

## 2013-01-05 NOTE — Telephone Encounter (Signed)
Left msg on triage received script for free style strips needing to know how frequent pt is testing, also need dx for insurance...Raechel Chute

## 2013-01-20 ENCOUNTER — Ambulatory Visit (INDEPENDENT_AMBULATORY_CARE_PROVIDER_SITE_OTHER): Payer: Medicare Other | Admitting: Internal Medicine

## 2013-01-20 ENCOUNTER — Encounter: Payer: Self-pay | Admitting: Internal Medicine

## 2013-01-20 VITALS — BP 118/62 | HR 79 | Temp 97.7°F | Resp 10 | Ht 69.5 in | Wt 254.0 lb

## 2013-01-20 DIAGNOSIS — E119 Type 2 diabetes mellitus without complications: Secondary | ICD-10-CM

## 2013-01-20 MED ORDER — "INSULIN SYRINGE-NEEDLE U-100 30G X 5/16"" 1 ML MISC": Status: DC

## 2013-01-20 MED ORDER — INSULIN NPH (HUMAN) (ISOPHANE) 100 UNIT/ML ~~LOC~~ SUSP: SUBCUTANEOUS | Status: DC

## 2013-01-20 NOTE — Patient Instructions (Addendum)
Please return in 1 month with your sugar log.  Please start NPH insulin at 25 units in am and 15 units at bedtime. If your sugars are >150 in the next 5 days, please increase to 30 units in am and 20 units at bedtime. Continue Amaryl for now. Stop Januvia.  PATIENT INSTRUCTIONS FOR TYPE 2 DIABETES:  **Please join MyChart!** - see attached instructions about how to join   DIET AND EXERCISE Diet and exercise is an important part of diabetic treatment.  We recommended aerobic exercise in the form of brisk walking (working between 40-60% of maximal aerobic capacity, similar to brisk walking) for 150 minutes per week (such as 30 minutes five days per week) along with 3 times per week performing 'resistance' training (using various gauge rubber tubes with handles) 5-10 exercises involving the major muscle groups (upper body, lower body and core) performing 10-15 repetitions (or near fatigue) each exercise. Start at half the above goal but build slowly to reach the above goals. If limited by weight, joint pain, or disability, we recommend daily walking in a swimming pool with water up to waist to reduce pressure from joints while allow for adequate exercise.    BLOOD GLUCOSES Monitoring your blood glucoses is important for continued management of your diabetes. Please check your blood glucoses 2-4 times a day: fasting, before meals and at bedtime (you can rotate these measurements - e.g. one day check before the 3 meals, the next day check before 2 of the meals and before bedtime, etc.   HYPOGLYCEMIA (low blood sugar) Hypoglycemia is usually a reaction to not eating, exercising, or taking too much insulin/ other diabetes drugs.  Symptoms include tremors, sweating, hunger, confusion, headache, etc. Treat IMMEDIATELY with 15 grams of Carbs:   4 glucose tablets    cup regular juice/soda   2 tablespoons raisins   4 teaspoons sugar   1 tablespoon honey Recheck blood glucose in 15 mins and repeat above  if still symptomatic/blood glucose <100. Please contact our office at [phone] if you have questions about how to next handle your insulin.  RECOMMENDATIONS TO REDUCE YOUR RISK OF DIABETIC COMPLICATIONS: * Take your prescribed MEDICATION(S). * Follow a DIABETIC diet: Complex carbs, fiber rich foods, heart healthy fish twice weekly, (monounsaturated and polyunsaturated) fats * AVOID saturated/trans fats, high fat foods, >2,300 mg salt per day. * EXERCISE at least 5 times a week for 30 minutes or preferably daily.  * DO NOT SMOKE OR DRINK more than 1 drink a day. * Check your FEET every day. Do not wear tightfitting shoes. Contact us if you develop an ulcer * See your EYE doctor once a year or more if needed * Get a FLU shot once a year * Get a PNEUMONIA vaccine once before and once after age 30 years  GOALS:  * Your Hemoglobin A1c of <7%  * Your Systolic BP should be 140 or lower  * Your Diastolic BP should be 80 or lower  * Your HDL (Good Cholesterol) should be 40 or higher  * Your LDL (Bad Cholesterol) should be 100 or lower  * Your Triglycerides should be 150 or lower  * Your Urine microalbumin (kidney function) should be <30 * Your Body Mass Index should be 25 or lower   We will be glad to help you achieve these goals. Our telephone number is: [phone].

## 2013-01-20 NOTE — Progress Notes (Signed)
Patient ID: William Hendrix, male   DOB: [birth year], 72 y.o.   MRN: [id number]  HPI: William Hendrix is a 72 y.o.-year-old male, referred by his PCP, Dr. Debby Bud, for management of DM2, insulin-dependent, uncontrolled, with complications (CAD, CKD stage 3). He is here with his wife who offers part of the history.  Patient has been diagnosed with diabetes in 2009; he has not been on insulin before. Last hemoglobin A1c was: Lab Results  Component Value Date   HGBA1C 7.5* 08/09/2012  Pt is on a regimen of: - Januvia 100 mg daily >> cannot afford ($100/3 mo) - was on Levemir 50 units qhs >> developed a severe skin reaction to Levemir >> had to go to the ED. There, he was given Novolog but he could tolerate this well.  - Amaryl 2x2 mg in am  Until his back sx 08/30/2012, his sugars were well controlled, in the 110-130, on Amaryl bid, then decreased to qd as he lost 30 lbs, but lately, his sugars are high, in the 200-300. Pt checks once a day. He brings his log for the last ~2 mo: - am: mid200s, but also has some 300s.  No lows. Lowest sugar was 196; he does not know whether he has hypoglycemia awareness. Highest sugar was 400.  Pt's meals are: - Breakfast: eggs & wheat toast or fiber cereals - Lunch: meats plus vegetables or a sandwich plus salad - Dinner: meat and vegetables - Snacks: one or 2 - nuts, pig skins, chips  Pt does not have chronic kidney disease, last BUN/creatinine was:  Lab Results  Component Value Date   BUN 28* 12/25/2012   CREATININE 1.80* 12/25/2012   Last set of lipids: Lab Results  Component Value Date   CHOL 179 08/09/2012   HDL 18.50* 08/09/2012   LDLCALC  Value: UNABLE TO CALCULATE IF TRIGLYCERIDE OVER 400 mg/dL    40/98/1191   LDLDIRECT 65.6 08/09/2012   TRIG 819.0 Lipemic Triglyceride is over 400; calculations on Lipids are invalid.* 08/09/2012   CHOLHDL 10 08/09/2012   Pt's last eye exam was in 2 years. No DR. Had cataract sx 5-6 years ago. Denies numbness  and tingling in his legs.  PMH: I reviewed her chart and she also has a history of CAD, s/p stents >10 years ago - sees Dr. Myrtis Ser, hypertension, hyperlipidemia, chronic kidney disease stage III, history of nephrolithiasis, obesity, hiatal hernia, obstructive sleep apnea, hyperglyceridemia, melanoma, spinal stenosis.  Pt has FH of DM2 in daughter and son. Extensive FH of cancer. He is the 12th of 13 children.   Past Surgical History  Procedure Laterality Date  . Nose surgery    . Cardiac catheterization    . Coronary stent placement  2000    CAD  . Knee arthroscopy  90's    rt  . Cervical disc surgery  90's  . Lumbar laminectomy/decompression microdiscectomy  08/30/2012    Procedure: LUMBAR LAMINECTOMY/DECOMPRESSION MICRODISCECTOMY 2 LEVELS;  Surgeon: Barnett Abu, MD;  Location: [facility];  Service: Neurosurgery;  Laterality: Bilateral;  Bilateral Lumbar three-four Lumbar four-five Laminotomies   History   Social History  . Marital Status: Married   Occupational History  . [employer]    Social History Main Topics  . Smoking status: Former Smoker -- 3.00 packs/day for 30 years    Quit date: 09/17/1996  . Smokeless tobacco: Never Used     Comment: occ drink  . Alcohol Use: No  . Drug Use: No   Social History  Narrative   Married   Current Outpatient Prescriptions on File Prior to Visit  Medication Sig Dispense Refill  . allopurinol (ZYLOPRIM) 300 MG tablet Take 1 tablet (300 mg total) by mouth daily.  90 tablet  1  . amoxicillin (AMOXIL) 875 MG tablet TAKE ONE TABLET BY MOUTH TWICE DAILY  14 tablet  0  . aspirin EC 81 MG tablet Take 81 mg by mouth daily.      Marland Kitchen atenolol (TENORMIN) 50 MG tablet Take 1 tablet (50 mg total) by mouth daily.  90 tablet  3  . Cholecalciferol (VITAMIN D) 1000 UNITS capsule Take 1,000 Units by mouth at bedtime.       . fenofibrate 160 MG tablet Take 1 tablet (160 mg total) by mouth daily.  90 tablet  1  . fluocinonide cream (LIDEX) 0.05 % Apply 1  application topically daily as needed. FOR RASH      . furosemide (LASIX) 80 MG tablet Take 1 tablet (80 mg total) by mouth daily.  90 tablet  3  . glimepiride (AMARYL) 2 MG tablet       . glucose blood test strip Use as instructed  200 each  10  . Insulin Pen Needle (1ST CHOICE PEN NEEDLES) 31G X 8 MM MISC One needle per day  90 each  3  . Lancets (FREESTYLE) lancets Use as instructed  200 each  10  . nitroGLYCERIN (NITROSTAT) 0.4 MG SL tablet Place 0.4 mg under the tongue every 5 (five) minutes as needed. FOR CHEST PAIN      . pantoprazole (PROTONIX) 20 MG tablet Take 20 mg by mouth daily.       No current facility-administered medications on file prior to visit.   Allergies  Allergen Reactions  . Levemir (Insulin Detemir) Swelling    Patient had redness and swelling and tenderness at injection site.  . Codeine Other (See Comments)    GI UPSET & TREMORS    Family History  Problem Relation Age of Onset  . Prostate cancer    . Kidney cancer    . Cancer      Bladder cancer  . Coronary artery disease     ROS: Constitutional: no weight gain/loss, increased appetite, + fatigue, no subjective hyperthermia/hypothermia, + poor sleep; + excessive urination + some burning with urination Eyes: no blurry vision, no xerophthalmia ENT: no sore throat, no nodules palpated in throat, no dysphagia/odynophagia, no hoarseness Cardiovascular: no CP/SOB/palpitations/leg swelling Respiratory: no cough/SOB Gastrointestinal: no N/V/+ D/no C Musculoskeletal: no muscle/joint aches Skin: no rashes Neurological: no tremors/numbness/tingling/dizziness Psychiatric: no depression/anxiety Difficulty with erections   PE: BP 118/62  Pulse 79  Temp(Src) 97.7 F (36.5 C) (Oral)  Resp 10  Ht 5' 9.5" (1.765 m)  Wt 254 lb (115.214 kg)  BMI 36.98 kg/m2  SpO2 97% Wt Readings from Last 3 Encounters:  01/20/13 254 lb (115.214 kg)  12/26/12 254 lb (115.214 kg)  12/20/12 252 lb (114.306 kg)   Constitutional: overweight, in NAD Eyes: PERRLA, EOMI, no exophthalmos ENT: moist mucous membranes, no thyromegaly, no cervical lymphadenopathy Cardiovascular: RRR, No MRG Respiratory: CTA B Gastrointestinal: abdomen soft, NT, ND, BS+ Musculoskeletal: no deformities, strength intact in all 4 Skin: moist, warm, no rashes Neurological: no tremor with outstretched hands, DTR normal in all 4  ASSESSMENT: 1. DM2, insulin-dependent, uncontrolled, with complications - CAD, s/p stents - Dr. Myrtis Ser - CKD stage 3  PLAN:  1. Patient with recently deteriorating diabetes control, after his back surgery. He admits that  he is less mobile after the surgery he does not exercise. He noticed that his sugars are in the 200s to 300s, and we reviewed his sugar log together. He only checks sugars in a.m., before eating and they are in the 200s to 370. - He was started on Levemir, however, he had a severe skin reaction to the medication (after 4 doses), and he had to go to the emergency room because of the swelling and erythema. He could tolerate NovoLog that was administered in the emergency room. - we are also limited by the patient's finances, he admits that he cannot afford the Januvia (he would pay $100 for 3 months) - We decided to stop Januvia for now - Will continue the Amaryl at the current dose of 4 mg daily - We discussed about metformin, and I explained that I cannot use this for him, because of his poor kidney function - We will need to decrease his morning CBGs, and I suggested that we use NPH insulin, starting at 25 units in a.m. and 15 units at at bedtime, increasing to 30 units in a.m. and 20 units in at bedtime in 2 days if his sugars are not at goal - I gave him prescriptions for ReliOn NPH and syringes, and I advised him to check if ReliOn NPH pen would be an option in terms of price - I advised the patient to let me know about his sugars in 2 weeks through my chart - given sugar log and advised  how to fill it and to bring it at next appt - he should check 3 times a day in the next month, before meals and at bedtime, rotating checks - given foot care handout and explained the principles - given instructions for hypoglycemia management "15-15 rule" - I will see him back in a month with his sugar log

## 2013-01-24 ENCOUNTER — Other Ambulatory Visit: Payer: Self-pay | Admitting: *Deleted

## 2013-01-24 MED ORDER — GLUCOSE BLOOD VI STRP: ORAL_STRIP | Status: DC

## 2013-01-24 MED ORDER — "INSULIN SYRINGE-NEEDLE U-100 30G X 5/16"" 1 ML MISC": Status: DC

## 2013-01-30 ENCOUNTER — Telehealth: Payer: Self-pay | Admitting: *Deleted

## 2013-01-30 ENCOUNTER — Other Ambulatory Visit: Payer: Self-pay

## 2013-01-30 MED ORDER — GLUCOSE BLOOD VI STRP: ORAL_STRIP | Status: DC

## 2013-01-30 MED ORDER — GLIMEPIRIDE 2 MG PO TABS: 2.0000 mg | ORAL_TABLET | Freq: Every day | ORAL | Status: DC

## 2013-01-30 MED ORDER — ALLOPURINOL 300 MG PO TABS: 300.0000 mg | ORAL_TABLET | Freq: Every day | ORAL | Status: DC

## 2013-01-30 NOTE — Telephone Encounter (Signed)
Pt's wife called stating that she had some questions and concerns. First, they have been out of town and she was unable to send his cbg's to Dr Elvera Lennox via [facility]. He has consistently been over 150 for 5 days. Her concern was that they are unable to refill his test strips due to it was not time and pt is testing 4 times daily. [facility] did not receive the refill request sent via escript on 01/24/13.  Advised pt's wife that we will send in a new refill to [facility] as she suggested. She requested that we send an rx refill of glimepiride to [facility]. She was also concerned about him going up on his insulin, would he have enough insulin until the next refill. Advised her that the rx was filled with sig reading Inject 30 units in the AM and 20 units at bedtime, with plenty of refills. She understood. I advised her to just keep an eye on it and if it seems he does not have enough, to call me. I sent new rx to [facility] for test strips and rx refill to [facility] for glimepiride.

## 2013-01-30 NOTE — Telephone Encounter (Signed)
Wife notified rx has been sent to [facility]

## 2013-01-30 NOTE — Telephone Encounter (Signed)
William Hendrix, thank you for doing this.

## 2013-02-07 ENCOUNTER — Other Ambulatory Visit: Payer: Self-pay | Admitting: Internal Medicine

## 2013-02-07 ENCOUNTER — Encounter: Payer: Self-pay | Admitting: Internal Medicine

## 2013-02-07 MED ORDER — INSULIN NPH (HUMAN) (ISOPHANE) 100 UNIT/ML ~~LOC~~ SUSP: SUBCUTANEOUS | Status: DC

## 2013-02-23 ENCOUNTER — Ambulatory Visit (INDEPENDENT_AMBULATORY_CARE_PROVIDER_SITE_OTHER): Payer: Medicare Other | Admitting: Internal Medicine

## 2013-02-23 ENCOUNTER — Encounter: Payer: Self-pay | Admitting: Internal Medicine

## 2013-02-23 VITALS — BP 114/62 | HR 66 | Temp 98.1°F | Resp 12

## 2013-02-23 DIAGNOSIS — E119 Type 2 diabetes mellitus without complications: Secondary | ICD-10-CM

## 2013-02-23 LAB — MICROALBUMIN / CREATININE URINE RATIO
Creatinine,U: 76.6 mg/dL
Microalb, Ur: 0.5 mg/dL (ref 0.0–1.9)

## 2013-02-23 MED ORDER — INSULIN PEN NEEDLE 31G X 8 MM MISC: Status: DC

## 2013-02-23 MED ORDER — INSULIN ISOPHANE & REGULAR (HUMAN 70-30)100 UNIT/ML KWIKPEN: 30.0000 [IU] | PEN_INJECTOR | Freq: Two times a day (BID) | SUBCUTANEOUS | Status: DC

## 2013-02-23 NOTE — Patient Instructions (Addendum)
Please return in 2 months with your sugar log.  Please send me the sugar log in approx 10 days-2 weeks.

## 2013-02-23 NOTE — Progress Notes (Signed)
Patient ID: YAQUB ARNEY, male   DOB: [birth year], 72 y.o.   MRN: [id number]  HPI: William Hendrix is a 72 y.o.-year-old male, returning for f/u for DM2, insulin-dependent, uncontrolled, with complications (CAD, CKD stage 3). He is here with his wife who offers part of the history.  Patient has been diagnosed with diabetes in 2009; he has not been on insulin before. Last hemoglobin A1c was: Lab Results  Component Value Date   HGBA1C 7.5* 08/09/2012   Pt is on a regimen of: - Relion 70/30 35 units in am and 25 units in pm - upon questioning, the patient is taking the evening insulin at that time rather than before dinner. Patient and his wife mentioned that he cannot afford We cannot use Metformin 2/2 CKD. We stopped Januvia at last visit >> could not afford ($100/3 mo)  We cannot use Levemir >> developed a severe skin reaction to Levemir  We stopped Amaryl since last visit.  At last visit, sugars were in the 200-300s, checked once a day. He brings his log for the last ~2 mo. He checks 4 times a day, and they reviewed his log together: - am: 169-228 - Before lunch 132-295, was about value of 308 - before dinner 124-194 - Before bedtime, highest sugars at that day, 175-240 No lows. Lowest sugar was 124,  he does not know whether he has hypoglycemia awareness. Highest sugar was 308, one time, when he was angry.  Pt has chronic kidney disease, last BUN/creatinine was:  Lab Results  Component Value Date   BUN 28* 12/25/2012   CREATININE 1.80* 12/25/2012   Pt's last eye exam was 2 years ago. No DR. Had cataract sx 5-6 years ago. Denies numbness and tingling in his legs.  PMH: Pt also also has a history of CAD, s/p stents >10 years ago - sees Dr. Myrtis Ser, hypertension, hyperlipidemia, chronic kidney disease stage III, history of nephrolithiasis, obesity, hiatal hernia, obstructive sleep apnea, hyperglyceridemia, melanoma, spinal stenosis.  ROS: Constitutional: no weight gain/loss, increased  appetite, + fatigue, no subjective hyperthermia/hypothermia, + poor sleep;  Eyes: no blurry vision, no xerophthalmia ENT: no sore throat, no nodules palpated in throat, no dysphagia/odynophagia, no hoarseness Cardiovascular: no CP/SOB/palpitations/leg swelling Respiratory: no cough/SOB Gastrointestinal: no N/V/+ D - this is very bothersome for the patient, as he has at least 3-4 stools a day, and sometimes does not make it to the bathroom per wife's report/no C Musculoskeletal: no muscle/joint aches Skin: no rashes Neurological: no tremors/numbness/tingling/dizziness Psychiatric: no depression/anxiety  I reviewed pt's medications, allergies, PMH, social hx, family hx and no changes required.  PE: BP 114/62  Pulse 66  Temp(Src) 98.1 F (36.7 C) (Oral)  Resp 12  SpO2 95% Wt Readings from Last 3 Encounters:  01/20/13 254 lb (115.214 kg)  12/26/12 254 lb (115.214 kg)  12/20/12 252 lb (114.306 kg)  Constitutional: overweight, in NAD Eyes: PERRLA, EOMI, no exophthalmos ENT: moist mucous membranes, no thyromegaly, no cervical lymphadenopathy Cardiovascular: RRR, No MRG Respiratory: CTA B Gastrointestinal: abdomen soft, NT, ND, BS+ Musculoskeletal: no deformities, strength intact in all 4 Skin: moist, warm, no rashes Neurological: no tremor with outstretched hands, DTR normal in all 4  ASSESSMENT: 1. DM2, insulin-dependent, uncontrolled, with complications - CAD, s/p stents - Dr. Myrtis Ser - CKD stage 3  PLAN:  1. Patient with recently deteriorating diabetes control, slightly improved after addition of NPH/regular Relion insulin 70/30 at last visit, however patient is taking the second dose of 70/30 before bedtime, rather  than before dinner, and I believe that is why he has high blood sugars at bedtime, that persist in the morning, too. - They also have a lot of problems affording his insulin, and they contacted [facility], who advised him to change the insulin to Humulin for better  coverage. They weren't quite sure about the type of insulin that we need to change to, so we called [facility] during the appointment and clarified. -  I will change the prescription from ReliOn to Humulin 70/30, for which they would pay only $100 for 3 months regardless of the number of pens, as opposed to $25 per insulin vial. - for now, I advised the patient to stay on the same doses of insulin, but to move the evening insulin to 30 minutes before dinner. - since they are going out of town before they receive insulin by mail, I gave them 3 NovoLog 70/30 pens to use until they can get the Humulin by mail  - I advised the patient to let me know about his sugars in 2 weeks through my chart - I will check a hemoglobin A1c and a microalbumin/creatinine ratio today - I will see the patient back in 2 months with his sugar log  Office Visit on 02/23/2013  Component Date Value Range Status  . Hemoglobin A1C 02/23/2013 8.8* 4.6 - 6.5 % Final   Glycemic Control Guidelines for People with Diabetes:Non Diabetic:  <6%Goal of Therapy: <7%Additional Action Suggested:  >8%   . Microalb, Ur 02/23/2013 0.5  0.0 - 1.9 mg/dL Final  . Creatinine,U 16/07/9603 76.6   Final  . Microalb Creat Ratio 02/23/2013 0.7  0.0 - 30.0 mg/g Final   Labs released to MyChart.

## 2013-03-17 ENCOUNTER — Other Ambulatory Visit: Payer: Self-pay

## 2013-03-17 MED ORDER — NITROGLYCERIN 0.4 MG SL SUBL: 0.4000 mg | SUBLINGUAL_TABLET | SUBLINGUAL | Status: DC | PRN

## 2013-03-20 ENCOUNTER — Other Ambulatory Visit: Payer: Self-pay

## 2013-03-20 MED ORDER — GLIMEPIRIDE 2 MG PO TABS: 2.0000 mg | ORAL_TABLET | Freq: Two times a day (BID) | ORAL | Status: DC

## 2013-03-20 MED ORDER — ALLOPURINOL 300 MG PO TABS: 300.0000 mg | ORAL_TABLET | Freq: Every day | ORAL | Status: DC

## 2013-03-30 ENCOUNTER — Telehealth: Payer: Self-pay | Admitting: Pulmonary Disease

## 2013-03-30 NOTE — Telephone Encounter (Signed)
I spoke with [facility]. She stated she already got everything resolved with the DME. She needed nothing from [place]. Will sign off message

## 2013-04-28 ENCOUNTER — Encounter: Payer: Self-pay | Admitting: Internal Medicine

## 2013-04-28 ENCOUNTER — Ambulatory Visit (INDEPENDENT_AMBULATORY_CARE_PROVIDER_SITE_OTHER): Payer: Medicare Other | Admitting: Internal Medicine

## 2013-04-28 VITALS — BP 110/58 | HR 72 | Temp 97.9°F | Resp 12 | Ht 69.5 in | Wt 261.0 lb

## 2013-04-28 DIAGNOSIS — E119 Type 2 diabetes mellitus without complications: Secondary | ICD-10-CM

## 2013-04-28 MED ORDER — INSULIN ISOPHANE & REGULAR (HUMAN 70-30)100 UNIT/ML KWIKPEN: 30.0000 [IU] | PEN_INJECTOR | Freq: Two times a day (BID) | SUBCUTANEOUS | Status: DC

## 2013-04-28 NOTE — Patient Instructions (Signed)
Please increase the dinnertime 70/30 to 30 units. Please return in 3 months with your sugar log.

## 2013-04-28 NOTE — Progress Notes (Signed)
Patient ID: William Hendrix, male   DOB: [birth year], 72 y.o.   MRN: [id number]  HPI: William Hendrix is a 72 y.o.-year-old male, returning for f/u for DM2, dx 2009, insulin-dependent, uncontrolled, with complications (CAD, CKD stage 3). He is here with his wife who offers part of the history. Last visit 2 mo ago.  Patient has been diagnosed with diabetes in 2009; he has not been on insulin before. Last hemoglobin A1c was: Lab Results  Component Value Date   HGBA1C 8.8* 02/23/2013  Last HbA1C was 7.4%.  Pt is on a regimen of: - Relion 70/30 35 units in am and 25 units in pm - upon questioning, the patient is now taking the evening insulin before dinner rather than at bedtime. They could not afford other insulin. We cannot use Metformin 2/2 CKD. We stopped Januvia at last visit >> could not afford ($100/3 mo)  We cannot use Levemir >> developed a severe skin reaction to Levemir  He still takes the Amaryl 4 mg in am  He brings his log for the last ~2 mo. He checks 4 times a day, and we reviewed his log together. Sugars are improved overall. - am: 169-228 >> 151-192 - Before lunch 132-295 >> 137-220, one value at 78 (injected 70/30 after he ate b'fast), one value at 272 - before dinner 124-194 >> 95-200 - Before bedtime, highest sugars at that day, 175-240 >> 157-179 No lows. Lowest sugar was 124,  he does not know whether he has hypoglycemia awareness. Highest sugar was 308, one time, when he was angry.  Pt has chronic kidney disease, last BUN/creatinine was:  Lab Results  Component Value Date   BUN 28* 12/25/2012   CREATININE 1.80* 12/25/2012  ACR 0.7 at last visit.   Pt's last eye exam was 2 years ago. No DR. Had cataract sx 5-6 years ago. Denies numbness and tingling in his legs.  PMH: Pt also also has a history of CAD, s/p stents >10 years ago - sees Dr. Myrtis Ser, hypertension, hyperlipidemia, chronic kidney disease stage III, history of nephrolithiasis, obesity, hiatal hernia,  obstructive sleep apnea, hyperglyceridemia, melanoma, spinal stenosis.  ROS: Constitutional: +  weight gain, increased appetite ("I can eat all day"), no fatigue, no subjective hyperthermia/hypothermia; no nocturia Eyes: no blurry vision, no xerophthalmia ENT: no sore throat, no nodules palpated in throat, no dysphagia/odynophagia, no hoarseness Cardiovascular: no CP/SOB/palpitations/leg swelling Respiratory: no cough/SOB Gastrointestinal: no N/V/+ D - this is very bothersome for the patient, as he has at least 3-4 stools a day, and sometimes does not make it to the bathroom per wife's report/no C Musculoskeletal: no muscle/joint aches Skin: no rashes Neurological: no tremors/numbness/tingling/dizziness  I reviewed pt's medications, allergies, PMH, social hx, family hx and no changes required.  PE: BP 110/58  Pulse 72  Temp(Src) 97.9 F (36.6 C) (Oral)  Resp 12  Ht 5' 9.5" (1.765 m)  Wt 261 lb (118.389 kg)  BMI 38 kg/m2  SpO2 97% Wt Readings from Last 3 Encounters:  04/28/13 261 lb (118.389 kg)  01/20/13 254 lb (115.214 kg)  12/26/12 254 lb (115.214 kg)  Constitutional: overweight, in NAD Eyes: PERRLA, EOMI, no exophthalmos ENT: moist mucous membranes, no thyromegaly, no cervical lymphadenopathy Cardiovascular: RRR, No MRG Respiratory: CTA B Gastrointestinal: abdomen soft, NT, ND, BS+ Musculoskeletal: no deformities, strength intact in all 4 Skin: moist, warm, no rashes Neurological: no tremor with outstretched hands, DTR normal in all 4  ASSESSMENT: 1. DM2, insulin-dependent, uncontrolled, with complications - CAD,  s/p stents - Dr. Myrtis Ser - CKD stage 3  PLAN:  1. Patient with recently deteriorating diabetes control, slightly improved after addition of NPH/regular Relion insulin 70/30, but at last visit, patient was taking the second dose of 70/30 before bedtime, rather than before dinner >> he had high blood sugars at bedtime, that persisted in the morning, too. We  discussed about how to take this correctly. At this visit, sugars are better. - He has a lot of problems affording his insulin, and they contacted [facility], who advised him to change the insulin to Humulin for better coverage. They weren't quite sure about the type of insulin that we need to change to, so we called [facility] during the appointment and clarified. I changed the prescription from ReliOn to Humulin 70/30, for which they pay only $100 for 3 months regardless of the number of pens, as opposed to $25 per insulin vial. This is still a little high for them. - since hs and am sugars are still higher than target, will increase pm dose of insulin70/30 to 30. Continue am dose at 35 units. - I told him that he can stop Amaryl - I strongly advised him to schedule a new eye exam - I also advised him to try to reduce his food portions as he gained 7 lbs in last 2 mo. - I will see the patient back in 3 months with his sugar log

## 2013-05-02 ENCOUNTER — Telehealth: Payer: Self-pay | Admitting: Internal Medicine

## 2013-05-02 NOTE — Telephone Encounter (Signed)
No samples available at this time.  Spoke with wife.

## 2013-05-12 HISTORY — PX: CORONARY ANGIOPLASTY WITH STENT PLACEMENT: SHX49

## 2013-05-15 ENCOUNTER — Other Ambulatory Visit: Payer: Self-pay | Admitting: *Deleted

## 2013-05-15 DIAGNOSIS — E119 Type 2 diabetes mellitus without complications: Secondary | ICD-10-CM

## 2013-05-15 MED ORDER — INSULIN ISOPHANE & REGULAR (HUMAN 70-30)100 UNIT/ML KWIKPEN: 30.0000 [IU] | PEN_INJECTOR | Freq: Two times a day (BID) | SUBCUTANEOUS | Status: DC

## 2013-05-15 NOTE — Telephone Encounter (Signed)
Pt switched to [facility] and they would like a new Rx sent to them.

## 2013-05-16 ENCOUNTER — Other Ambulatory Visit: Payer: Self-pay | Admitting: *Deleted

## 2013-05-16 MED ORDER — INSULIN NPH ISOPHANE & REGULAR (70-30) 100 UNIT/ML ~~LOC~~ SUSP: SUBCUTANEOUS | Status: DC

## 2013-05-16 NOTE — Telephone Encounter (Signed)
Change of formulary. From pens to vials.

## 2013-05-18 ENCOUNTER — Inpatient Hospital Stay (HOSPITAL_COMMUNITY)
Admission: EM | Admit: 2013-05-18 | Discharge: 2013-05-20 | DRG: 247 | Disposition: A | Discharge status: Home / Self Care | Payer: Medicare Other | Attending: Cardiology | Admitting: Cardiology

## 2013-05-18 ENCOUNTER — Emergency Department (HOSPITAL_COMMUNITY): Payer: Medicare Other

## 2013-05-18 ENCOUNTER — Encounter (HOSPITAL_COMMUNITY): Payer: Self-pay | Admitting: Emergency Medicine

## 2013-05-18 DIAGNOSIS — G4733 Obstructive sleep apnea (adult) (pediatric): Secondary | ICD-10-CM | POA: Diagnosis present

## 2013-05-18 DIAGNOSIS — N183 Chronic kidney disease, stage 3 unspecified: Secondary | ICD-10-CM | POA: Diagnosis present

## 2013-05-18 DIAGNOSIS — N289 Disorder of kidney and ureter, unspecified: Secondary | ICD-10-CM

## 2013-05-18 DIAGNOSIS — Z79899 Other long term (current) drug therapy: Secondary | ICD-10-CM

## 2013-05-18 DIAGNOSIS — Z85820 Personal history of malignant melanoma of skin: Secondary | ICD-10-CM

## 2013-05-18 DIAGNOSIS — T82897A Other specified complication of cardiac prosthetic devices, implants and grafts, initial encounter: Secondary | ICD-10-CM | POA: Diagnosis present

## 2013-05-18 DIAGNOSIS — E781 Pure hyperglyceridemia: Secondary | ICD-10-CM

## 2013-05-18 DIAGNOSIS — I129 Hypertensive chronic kidney disease with stage 1 through stage 4 chronic kidney disease, or unspecified chronic kidney disease: Secondary | ICD-10-CM | POA: Diagnosis present

## 2013-05-18 DIAGNOSIS — K219 Gastro-esophageal reflux disease without esophagitis: Secondary | ICD-10-CM | POA: Diagnosis present

## 2013-05-18 DIAGNOSIS — I10 Essential (primary) hypertension: Secondary | ICD-10-CM

## 2013-05-18 DIAGNOSIS — Z87891 Personal history of nicotine dependence: Secondary | ICD-10-CM

## 2013-05-18 DIAGNOSIS — R079 Chest pain, unspecified: Secondary | ICD-10-CM

## 2013-05-18 DIAGNOSIS — E785 Hyperlipidemia, unspecified: Secondary | ICD-10-CM

## 2013-05-18 DIAGNOSIS — Y849 Medical procedure, unspecified as the cause of abnormal reaction of the patient, or of later complication, without mention of misadventure at the time of the procedure: Secondary | ICD-10-CM | POA: Diagnosis present

## 2013-05-18 DIAGNOSIS — Z794 Long term (current) use of insulin: Secondary | ICD-10-CM

## 2013-05-18 DIAGNOSIS — Z7982 Long term (current) use of aspirin: Secondary | ICD-10-CM

## 2013-05-18 DIAGNOSIS — E669 Obesity, unspecified: Secondary | ICD-10-CM | POA: Diagnosis present

## 2013-05-18 DIAGNOSIS — E119 Type 2 diabetes mellitus without complications: Secondary | ICD-10-CM | POA: Diagnosis present

## 2013-05-18 DIAGNOSIS — M109 Gout, unspecified: Secondary | ICD-10-CM | POA: Diagnosis present

## 2013-05-18 DIAGNOSIS — I2510 Atherosclerotic heart disease of native coronary artery without angina pectoris: Secondary | ICD-10-CM

## 2013-05-18 DIAGNOSIS — I200 Unstable angina: Secondary | ICD-10-CM

## 2013-05-18 HISTORY — DX: Other specified abnormal findings of blood chemistry: R79.89

## 2013-05-18 HISTORY — DX: Spinal stenosis, site unspecified: M48.00

## 2013-05-18 LAB — CBC WITH DIFFERENTIAL/PLATELET
Basophils Relative: 0 % (ref 0–1)
Eosinophils Absolute: 0.1 10*3/uL (ref 0.0–0.7)
Eosinophils Relative: 2 % (ref 0–5)
Hemoglobin: 14.7 g/dL (ref 13.0–17.0)
MCH: 33.1 pg (ref 26.0–34.0)
MCHC: 36.6 g/dL — ABNORMAL HIGH (ref 30.0–36.0)
Monocytes Relative: 7 % (ref 3–12)
Neutrophils Relative %: 48 % (ref 43–77)

## 2013-05-18 LAB — BASIC METABOLIC PANEL
BUN: 35 mg/dL — ABNORMAL HIGH (ref 6–23)
Calcium: 10.2 mg/dL (ref 8.4–10.5)
Creatinine, Ser: 1.51 mg/dL — ABNORMAL HIGH (ref 0.50–1.35)
GFR calc Af Amer: 51 mL/min — ABNORMAL LOW (ref >90)
GFR calc non Af Amer: 44 mL/min — ABNORMAL LOW (ref >90)
Potassium: 4.1 mEq/L (ref 3.5–5.1)

## 2013-05-18 LAB — GLUCOSE, CAPILLARY: Glucose-Capillary: 196 mg/dL — ABNORMAL HIGH (ref 70–99)

## 2013-05-18 MED ORDER — ACETAMINOPHEN 325 MG PO TABS: 650.0000 mg | ORAL_TABLET | ORAL | Status: DC | PRN

## 2013-05-18 MED ORDER — ACETAMINOPHEN 500 MG PO TABS
1000.0000 mg | ORAL_TABLET | Freq: Two times a day (BID) | ORAL | Status: DC
  Administered 2013-05-18 – 2013-05-19 (×2): 1000 mg via ORAL
  Filled 2013-05-18 (×6): qty 2

## 2013-05-18 MED ORDER — HEPARIN BOLUS VIA INFUSION
4000.0000 [IU] | Freq: Once | INTRAVENOUS | Status: AC
  Administered 2013-05-18: 4000 [IU] via INTRAVENOUS

## 2013-05-18 MED ORDER — INSULIN ASPART 100 UNIT/ML ~~LOC~~ SOLN
0.0000 [IU] | Freq: Three times a day (TID) | SUBCUTANEOUS | Status: DC
  Administered 2013-05-19: 3 [IU] via SUBCUTANEOUS

## 2013-05-18 MED ORDER — INSULIN ISOPHANE & REGULAR (HUMAN 70-30)100 UNIT/ML KWIKPEN: 30.0000 [IU] | PEN_INJECTOR | Freq: Two times a day (BID) | SUBCUTANEOUS | Status: DC

## 2013-05-18 MED ORDER — INSULIN ASPART PROT & ASPART (70-30 MIX) 100 UNIT/ML ~~LOC~~ SUSP
30.0000 [IU] | Freq: Every day | SUBCUTANEOUS | Status: DC
  Filled 2013-05-18: qty 10

## 2013-05-18 MED ORDER — ONDANSETRON HCL 4 MG/2ML IJ SOLN: 4.0000 mg | Freq: Four times a day (QID) | INTRAMUSCULAR | Status: DC | PRN

## 2013-05-18 MED ORDER — PANTOPRAZOLE SODIUM 20 MG PO TBEC
20.0000 mg | DELAYED_RELEASE_TABLET | Freq: Every day | ORAL | Status: DC
  Administered 2013-05-18: 20 mg via ORAL
  Filled 2013-05-18 (×3): qty 1

## 2013-05-18 MED ORDER — SODIUM CHLORIDE 0.9 % IJ SOLN
3.0000 mL | Freq: Two times a day (BID) | INTRAMUSCULAR | Status: DC
  Administered 2013-05-19: 3 mL via INTRAVENOUS

## 2013-05-18 MED ORDER — SODIUM CHLORIDE 0.9 % IV SOLN: 250.0000 mL | INTRAVENOUS | Status: DC | PRN

## 2013-05-18 MED ORDER — ALLOPURINOL 300 MG PO TABS
300.0000 mg | ORAL_TABLET | Freq: Every day | ORAL | Status: DC
Start: 2013-05-18 — End: 2013-05-20
  Administered 2013-05-19: 300 mg via ORAL
  Filled 2013-05-18 (×3): qty 1

## 2013-05-18 MED ORDER — ZOLPIDEM TARTRATE 5 MG PO TABS: 5.0000 mg | ORAL_TABLET | Freq: Every evening | ORAL | Status: DC | PRN

## 2013-05-18 MED ORDER — NITROGLYCERIN 2 % TD OINT
1.0000 [in_us] | TOPICAL_OINTMENT | Freq: Four times a day (QID) | TRANSDERMAL | Status: DC
  Administered 2013-05-18 – 2013-05-20 (×5): 1 [in_us] via TOPICAL
  Filled 2013-05-18 (×2): qty 30

## 2013-05-18 MED ORDER — VITAMIN D 1000 UNITS PO TABS
1000.0000 [IU] | ORAL_TABLET | Freq: Every day | ORAL | Status: DC
  Administered 2013-05-18 – 2013-05-19 (×2): 1000 [IU] via ORAL
  Filled 2013-05-18 (×5): qty 1

## 2013-05-18 MED ORDER — SODIUM CHLORIDE 0.9 % IV SOLN
INTRAVENOUS | Status: DC
  Administered 2013-05-18: 22:00:00 via INTRAVENOUS

## 2013-05-18 MED ORDER — INSULIN ASPART PROT & ASPART (70-30 MIX) 100 UNIT/ML ~~LOC~~ SUSP
35.0000 [IU] | Freq: Every day | SUBCUTANEOUS | Status: DC
  Filled 2013-05-18: qty 10

## 2013-05-18 MED ORDER — NITROGLYCERIN 0.4 MG SL SUBL: 0.4000 mg | SUBLINGUAL_TABLET | SUBLINGUAL | Status: DC | PRN

## 2013-05-18 MED ORDER — ASPIRIN EC 81 MG PO TBEC
81.0000 mg | DELAYED_RELEASE_TABLET | Freq: Every day | ORAL | Status: DC
  Filled 2013-05-18 (×3): qty 1

## 2013-05-18 MED ORDER — HEPARIN (PORCINE) IN NACL 100-0.45 UNIT/ML-% IJ SOLN
1550.0000 [IU]/h | INTRAMUSCULAR | Status: DC
  Administered 2013-05-18: 1350 [IU]/h via INTRAVENOUS
  Administered 2013-05-19: 1550 [IU]/h via INTRAVENOUS
  Filled 2013-05-18 (×4): qty 250

## 2013-05-18 MED ORDER — ALPRAZOLAM 0.25 MG PO TABS: 0.2500 mg | ORAL_TABLET | Freq: Two times a day (BID) | ORAL | Status: DC | PRN

## 2013-05-18 MED ORDER — ASPIRIN 81 MG PO CHEW
324.0000 mg | CHEWABLE_TABLET | ORAL | Status: AC
  Administered 2013-05-19: 324 mg via ORAL
  Filled 2013-05-18: qty 4

## 2013-05-18 MED ORDER — SODIUM CHLORIDE 0.9 % IV BOLUS (SEPSIS)
500.0000 mL | Freq: Once | INTRAVENOUS | Status: AC
  Administered 2013-05-18: 500 mL via INTRAVENOUS

## 2013-05-18 MED ORDER — ATENOLOL 50 MG PO TABS
50.0000 mg | ORAL_TABLET | Freq: Every day | ORAL | Status: DC
  Administered 2013-05-19: 50 mg via ORAL
  Filled 2013-05-18 (×2): qty 1

## 2013-05-18 MED ORDER — SODIUM CHLORIDE 0.9 % IJ SOLN: 3.0000 mL | INTRAMUSCULAR | Status: DC | PRN

## 2013-05-18 MED ORDER — FENOFIBRATE 160 MG PO TABS
160.0000 mg | ORAL_TABLET | Freq: Every day | ORAL | Status: DC
  Administered 2013-05-18: 160 mg via ORAL
  Filled 2013-05-18 (×3): qty 1

## 2013-05-18 MED ORDER — GLIMEPIRIDE 2 MG PO TABS
2.0000 mg | ORAL_TABLET | Freq: Every day | ORAL | Status: DC
  Filled 2013-05-18 (×3): qty 1

## 2013-05-18 NOTE — ED Notes (Addendum)
Family at bedside. 

## 2013-05-18 NOTE — ED Notes (Signed)
Dr. Linker at bedside  

## 2013-05-18 NOTE — H&P (Signed)
Patient ID: William EHRMANN MRN: [id number], DOB/AGE: [birth year]   Admit date: 05/18/2013   Primary Physician: [name], MD Primary Cardiologist: Lovena Neighbours, MD  Pt. Profile:  72 y/o male with h/o CAD s/p PCI of the LAD in 2000, who presented to the ED today with recurrent chest pain.  Problem List  Past Medical History  Diagnosis Date  . Obesity   . Edema   . DM (diabetes mellitus)   . Renal insufficiency      CKD... creatinine up to 1.5 in hospital after dye in October, 2011  . Fatigue   . Nephrolithiasis   . Gout   . Skin cancer   . CAD (coronary artery disease)     stent 2000 /  cath,stent patent 2006 / cath 2007,stent probably patent ,but difficult to assess / cath 07/2010, patent LAD stent with 30-40% ISR, otw nonobs dzs.  . Ventral hernia   . Sinusitis   . Back problem   . Hyperlipidemia   . HTN (hypertension)   . Positive D dimer     significant elevation ,hospital 07/2010, etiology unclear  . Ejection fraction     55%, 07/2010, mild inferior hypo  . SOB (shortness of breath)     mild restrictive defect on PFTs...likely due to obesity and some volume overload  . Dizziness     Mild positional dizziness May, 2012  . History of pneumonia   . Melanoma     Melanoma removed from the left forearm with wide excision May, 2012  . Pneumonia     hx x4 young  . Sleep apnea     CPAP,  [name] since 2000  . OSA (obstructive sleep apnea)      CPAP.. Dr. Craige Cotta  . GERD (gastroesophageal reflux disease)   . H/O hiatal hernia   . Arthritis   . Spinal stenosis     a. s/p surgical repair 2013  . Abnormal laboratory test result     a. D Dimer chronically > 20.   *  Chronic elevation of D Dimer (>20)  Past Surgical History  Procedure Laterality Date  . Nose surgery    . Cardiac catheterization    . Coronary stent placement  2000    CAD  . Knee arthroscopy  90's    rt  . Cervical disc surgery  90's  . Lumbar laminectomy/decompression microdiscectomy  08/30/2012      Procedure: LUMBAR LAMINECTOMY/DECOMPRESSION MICRODISCECTOMY 2 LEVELS;  Surgeon: Barnett Abu, MD;  Location: [facility];  Service: Neurosurgery;  Laterality: Bilateral;  Bilateral Lumbar three-four Lumbar four-five Laminotomies     Allergies  Allergies  Allergen Reactions  . Levemir (Insulin Detemir) Swelling    Patient had redness and swelling and tenderness at injection site.  . Codeine Other (See Comments)    GI UPSET & TREMORS  . Nitroglycerin     HPI  72 y/o male with h/o CAD s/p LAD stenting in 2000.  Last cath in 2011 revealed mild ISR within the LAD and otw nonobstructive CAD.  Over the years, he has noted occasional chest discomfort, typically occurring ~ 1x/mont, @ rest, lasting a few mins and resolving spontaneously.  He is s/p back surgery in 08/2012 2/2 spinal stenosis and his activity has been reduced since then.  Yesterday he painted @ a school w/o difficulty and today he returned there to put on a second coat.  While using a paint roller, he had sudden onset of severe retrosternal chest pressure  associated with dyspnea, nausea, and diaphoresis.  His dtr noted that he was very pale and pt stated that he thought he might lose consciousness.  Dtr assisted him to the floor and called EMS.  He took 1 sl NTG with some relief in about 5-10 mins and upon EMS arrival, he was given additional ntg with further relief.  In all, the Ss lasted about 40 mins and then were followed by a less severe generalized tightness, which persisted once he got to the ER.  Here, ECG is non-acute and initial troponin is nl.  He is currently pain free.  Home Medications  Prior to Admission medications   Medication Sig Start Date End Date Taking? Authorizing Provider  acetaminophen (TYLENOL) 500 MG tablet Take 1,000 mg by mouth 2 (two) times daily.   Yes Historical Provider, MD  allopurinol (ZYLOPRIM) 300 MG tablet Take 1 tablet (300 mg total) by mouth daily. 03/20/13  Yes Jacques Navy, MD  aspirin  EC 81 MG tablet Take 81 mg by mouth daily. 02/23/11  Yes [name], MD  atenolol (TENORMIN) 50 MG tablet Take 1 tablet (50 mg total) by mouth daily. 11/02/12  Yes Tonny Bollman, MD  Cholecalciferol (VITAMIN D) 1000 UNITS capsule Take 1,000 Units by mouth at bedtime.    Yes Historical Provider, MD  fenofibrate 160 MG tablet Take 1 tablet (160 mg total) by mouth daily. 12/28/12  Yes [name], MD  fluocinonide cream (LIDEX) 0.05 % Apply 1 application topically daily as needed. FOR RASH 08/11/12  Yes Historical Provider, MD  furosemide (LASIX) 80 MG tablet Take 1 tablet (80 mg total) by mouth daily. 11/02/12  Yes Tonny Bollman, MD  glimepiride (AMARYL) 2 MG tablet Take 2 mg by mouth daily.   Yes Historical Provider, MD  Insulin Isophane & Regular (HUMULIN 70/30 KWIKPEN) (70-30) 100 UNIT/ML SUPN Inject 30-35 Units into the skin 2 (two) times daily. Inject 35 units in the morning and 30 units in the evening. 05/15/13  Yes Carlus Pavlov, MD  nitroGLYCERIN (NITROSTAT) 0.4 MG SL tablet Place 1 tablet (0.4 mg total) under the tongue every 5 (five) minutes as needed. FOR CHEST PAIN 03/17/13  Yes [name], MD  pantoprazole (PROTONIX) 20 MG tablet Take 20 mg by mouth daily.   Yes Historical Provider, MD  glucose blood test strip Use as instructed, test 4 times daily. 01/30/13   Carlus Pavlov, MD  Insulin Pen Needle (1ST CHOICE PEN NEEDLES) 31G X 8 MM MISC 2 needles per day 02/23/13   Carlus Pavlov, MD  Insulin Syringe-Needle U-100 (RELION INSULIN SYR 1CC/30G) 30G X 5/16" 1 ML MISC Use 2x daily 01/24/13   Carlus Pavlov, MD  Lancets (FREESTYLE) lancets Use as instructed 01/04/13   Jacques Navy, MD  RELION INSULIN SYR 1CC/30G 30G X 5/16" 1 ML MISC  01/20/13   Historical Provider, MD    Family History  Family History  Problem Relation Age of Onset  . Prostate cancer    . Kidney cancer    . Cancer      Bladder cancer  . Coronary artery disease      Social History  History   Social  History  . Marital Status: Married    Spouse Name: N/A    Number of Children: N/A  . Years of Education: N/A   Occupational History  . [employer]    Social History Main Topics  . Smoking status: Former Smoker -- 3.00 packs/day for 30 years  Quit date: 09/17/1996  . Smokeless tobacco: Never Used     Comment: occ drink  . Alcohol Use: No  . Drug Use: No  . Sexually Active: Not on file   Other Topics Concern  . Not on file   Social History Narrative   Married and lives locally with his wife.  [employer].     Review of Systems General:  No chills, fever, night sweats or weight changes.  Cardiovascular:  +++ chest pain, dyspnea, nausea, diaphoresis as outlined above.  No edema, orthopnea, palpitations, paroxysmal nocturnal dyspnea. Dermatological: No rash, lesions/masses Respiratory: No cough, +++ dyspnea Urologic: No hematuria, dysuria Abdominal:   +++ nausea, no vomiting, diarrhea, bright red blood per rectum, melena, or hematemesis Neurologic:  No visual changes, wkns, changes in mental status. All other systems reviewed and are otherwise negative except as noted above.  Physical Exam  Blood pressure 102/65, pulse 61, temperature 97.6 F (36.4 C), temperature source Oral, resp. rate 15, SpO2 96.00%.  General: Pleasant, NAD Psych: Normal affect. Neuro: Alert and oriented X 3. Moves all extremities spontaneously. HEENT: Normal  Neck: Supple without bruits or JVD. Lungs:  Resp regular and unlabored, CTA. Heart: RRR no s3, s4, or murmurs. Abdomen: Soft, non-tender, non-distended, BS + x 4.  Extremities: No clubbing, cyanosis or edema. DP/PT/Radials 2+ and equal bilaterally.  Labs   Recent Labs  05/18/13 1307  TROPONINI <0.30   Lab Results  Component Value Date   WBC 7.2 05/18/2013   HGB 14.7 05/18/2013   HCT 40.2 05/18/2013   MCV 90.5 05/18/2013   PLT 190 05/18/2013     Recent Labs Lab 05/18/13 1306  NA 142  K 4.1  CL 102  CO2 28  BUN 35*  CREATININE 1.51*    CALCIUM 10.2  GLUCOSE 112*   Radiology/Studies  Dg Chest 2 View  05/18/2013   *RADIOLOGY REPORT*  Clinical Data: Pain today.  History of hypertension and diabetes.  CHEST - 2 VIEW  Comparison: 08/29/2012 radiographs.  CT 08/04/2010.  Findings: The heart size and mediastinal contours are stable. Coronary artery stent is noted.  The lungs are clear and there is no pleural effusion or pneumothorax.  No acute osseous findings are seen.  Degenerative changes are present throughout the thoracic spine.  IMPRESSION: Stable chest.  No active cardiopulmonary process.   Original Report Authenticated By: Carey Bullocks, M.D.   ECG  Rsr, 65, minimal inf st dep, unchanged from prior.  ASSESSMENT AND PLAN  1.  USA/CAD:  Pt presented to ED following ~ 30-40 of nitrate responsive chest pain associated with dyspnea, nausea, diaphoresis, and lightheadedness.  ECG is non-acute and initial troponin is nl.  He is now pain free.  We will admit, cycle CE, and plan on cath in the AM.  He has a h/o renal insuff and creat is 1.5.  Hydrate tonight and hold lasix.  Cont asa, bb.  Add heparin/ntp.  2.  HTN:  Stable.  3.  HL/HTG:  On fibrate.  4.  DM:  Add ssi.  5.  CKD III:  Hold lasix.  Hydrate.   Signed, Nicolasa Ducking, NP 05/18/2013, 3:32 PM As above, patient seen and examined. Briefly he is a 72 year old male with a past medical history of coronary artery disease, diabetes mellitus, renal insufficiency, hypertension, hyperlipidemia who presents with unstable angina. The patient was painting today. He suddenly developed chest heaviness similar to his previous cardiac pain. He had diaphoresis, dyspnea and mild nausea. His pain lasted for 30-45  minutes. It resolved with nitroglycerin and aspirin. He is presently pain-free. Electrocardiogram shows sinus rhythm with nonspecific ST changes. Chest pain is concerning. Plan to admit and rule out myocardial infarction with serial enzymes. Treat with aspirin, beta blocker,  heparin. Plan cardiac catheterization tomorrow morning. Hold Lasix. Hydrate prior to procedure. No vgram. Check echocardiogram for LV function. The risks and benefits of cardiac catheterization were discussed and the patient agrees to proceed. Olga Millers 4:30 PM

## 2013-05-18 NOTE — ED Provider Notes (Signed)
CSN: [id number]     Arrival date & time 05/18/13  1230 History     First MD Initiated Contact with Patient 05/18/13 1245     Chief Complaint  Patient presents with  . Chest Pain   (Consider location/radiation/quality/duration/timing/severity/associated sxs/prior Treatment) HPI Pt presenting with chest pain. He has hx of CAD and cardiac stents.  He was rolling paint onto a wall when pain began. He became diaphoretic and short of breath.  Pt had 2 nitro prior to arrival and states pain resolved with these.  No fever, cough.  No leg swelling.  States he rarely needs to take nitroglycerin for chest pain.  Pain described as midsternal and a dull aching pain.  There are no other associated systemic symptoms, there are no other alleviating or modifying factors.   Past Medical History  Diagnosis Date  . Obesity   . Edema   . DM (diabetes mellitus)   . Renal insufficiency      CKD... creatinine up to 1.5 in hospital after dye in October, 2011  . Fatigue   . Nephrolithiasis   . Gout   . Skin cancer   . CAD (coronary artery disease)     stent 2000 /  cath,stent patent 2006 / cath 2007,stent probably patent ,but difficult to assess / cath 07/2010, patent LAD stent with 30-40% ISR, otw nonobs dzs.  . Ventral hernia   . Sinusitis   . Back problem   . Hyperlipidemia   . HTN (hypertension)   . Positive D dimer     significant elevation ,hospital 07/2010, etiology unclear  . Ejection fraction     55%, 07/2010, mild inferior hypo  . SOB (shortness of breath)     mild restrictive defect on PFTs...likely due to obesity and some volume overload  . Dizziness     Mild positional dizziness May, 2012  . History of pneumonia   . Melanoma     Melanoma removed from the left forearm with wide excision May, 2012  . Pneumonia     hx x4 young  . Sleep apnea     CPAP,  [name] since 2000  . OSA (obstructive sleep apnea)      CPAP.. Dr. Craige Cotta  . GERD (gastroesophageal reflux disease)   . H/O hiatal  hernia   . Arthritis   . Spinal stenosis     a. s/p surgical repair 2013  . Abnormal laboratory test result     a. D Dimer chronically > 20.   Past Surgical History  Procedure Laterality Date  . Nose surgery    . Cardiac catheterization    . Coronary stent placement  2000    CAD  . Knee arthroscopy  90's    rt  . Cervical disc surgery  90's  . Lumbar laminectomy/decompression microdiscectomy  08/30/2012    Procedure: LUMBAR LAMINECTOMY/DECOMPRESSION MICRODISCECTOMY 2 LEVELS;  Surgeon: Barnett Abu, MD;  Location: [facility];  Service: Neurosurgery;  Laterality: Bilateral;  Bilateral Lumbar three-four Lumbar four-five Laminotomies  . Back surgery     Family History  Problem Relation Age of Onset  . Prostate cancer    . Kidney cancer    . Cancer      Bladder cancer  . Coronary artery disease     History  Substance Use Topics  . Smoking status: Former Smoker -- 3.00 packs/day for 30 years    Types: Cigarettes    Quit date: 09/17/1996  . Smokeless tobacco: Never Used  Comment: occ drink  . Alcohol Use: No    Review of Systems ROS reviewed and all otherwise negative except for mentioned in HPI  Allergies  Levemir and Codeine  Home Medications   No current outpatient prescriptions on file. BP 120/65  Pulse 55  Temp(Src) 97.6 F (36.4 C) (Oral)  Resp 18  Ht 5' 10.5" (1.791 m)  Wt 257 lb 8 oz (116.8 kg)  BMI 36.41 kg/m2  SpO2 100% Vitals reviewed Physical Exam Physical Examination: General appearance - alert, well appearing, and in no distress Mental status - alert, oriented to person, place, and time Eyes - no scleral icterus, no conjunctival injection Mouth - mucous membranes moist, pharynx normal without lesions Chest - clear to auscultation, no wheezes, rales or rhonchi, symmetric air entry Heart - normal rate, regular rhythm, normal S1, S2, no murmurs, rubs, clicks or gallops Abdomen - soft, nontender, nondistended, no masses or  organomegaly Extremities - peripheral pulses normal, no pedal edema, no clubbing or cyanosis Skin - normal coloration and turgor, no rashes  ED Course   Procedures (including critical care time)   Date: 05/18/2013  Rate: 57  Rhythm: sinus bradycardia  QRS Axis: left  Intervals: PR prolonged  ST/T Wave abnormalities: nonspecific ST/T changes  Conduction Disutrbances:left anterior fascicular block, PVCs versus artifact  Narrative Interpretation:   Old EKG Reviewed:  no significant changes compared to prior ekg of 08/16/2012  3:01 PM d/w [facility] cardiology- they will see patient in the ED.   CRITICAL CARE Performed by: Ethelda Chick Total critical care time: 35 Critical care time was exclusive of separately billable procedures and treating other patients. Critical care was necessary to treat or prevent imminent or life-threatening deterioration. Critical care was time spent personally by me on the following activities: development of treatment plan with patient and/or surrogate as well as nursing, discussions with consultants, evaluation of patient's response to treatment, examination of patient, obtaining history from patient or surrogate, ordering and performing treatments and interventions, ordering and review of laboratory studies, ordering and review of radiographic studies, pulse oximetry and re-evaluation of patient's condition.   Labs Reviewed  CBC WITH DIFFERENTIAL - Abnormal; Notable for the following:    MCHC 36.6 (*)    All other components within normal limits  BASIC METABOLIC PANEL - Abnormal; Notable for the following:    Glucose, Bld 112 (*)    BUN 35 (*)    Creatinine, Ser 1.51 (*)    GFR calc non Af Amer 44 (*)    GFR calc Af Amer 51 (*)    All other components within normal limits  HEPARIN LEVEL (UNFRACTIONATED) - Abnormal; Notable for the following:    Heparin Unfractionated 0.21 (*)    All other components within normal limits  COMPREHENSIVE METABOLIC PANEL -  Abnormal; Notable for the following:    Glucose, Bld 144 (*)    BUN 30 (*)    Creatinine, Ser 1.54 (*)    GFR calc non Af Amer 43 (*)    GFR calc Af Amer 50 (*)    All other components within normal limits  GLUCOSE, CAPILLARY - Abnormal; Notable for the following:    Glucose-Capillary 196 (*)    All other components within normal limits  LIPID PANEL - Abnormal; Notable for the following:    Triglycerides 479 (*)    HDL 18 (*)    All other components within normal limits  GLUCOSE, CAPILLARY - Abnormal; Notable for the following:    Glucose-Capillary 228 (*)  All other components within normal limits  GLUCOSE, CAPILLARY - Abnormal; Notable for the following:    Glucose-Capillary 136 (*)    All other components within normal limits  GLUCOSE, CAPILLARY - Abnormal; Notable for the following:    Glucose-Capillary 112 (*)    All other components within normal limits  GLUCOSE, CAPILLARY - Abnormal; Notable for the following:    Glucose-Capillary 170 (*)    All other components within normal limits  TROPONIN I  TROPONIN I  TROPONIN I  HEPARIN LEVEL (UNFRACTIONATED)  BASIC METABOLIC PANEL  CBC  POCT ACTIVATED CLOTTING TIME   Dg Chest 2 View  05/18/2013   *RADIOLOGY REPORT*  Clinical Data: Pain today.  History of hypertension and diabetes.  CHEST - 2 VIEW  Comparison: 08/29/2012 radiographs.  CT 08/04/2010.  Findings: The heart size and mediastinal contours are stable. Coronary artery stent is noted.  The lungs are clear and there is no pleural effusion or pneumothorax.  No acute osseous findings are seen.  Degenerative changes are present throughout the thoracic spine.  IMPRESSION: Stable chest.  No active cardiopulmonary process.   Original Report Authenticated By: Carey Bullocks, M.D.   1. Chest pain   2. Intermediate coronary syndrome   3. Renal insufficiency     MDM  Pt with hx of CAD presenting with chest pain.  Workup in the ED reassuring.  Pt remains chest pain free.  Mild  hypotension responded to IV hydration.  Cardiology consulted and is planning to admit patient.  He was started on heparin drip.  Pt and family updated about findings and plan for admission.       Ethelda Chick, MD 05/19/13 [id number]

## 2013-05-18 NOTE — ED Notes (Signed)
Per EMS: pt was painting a wall with sudden onset of chest pain 9/10. Pt became very diaphoretic. Pt took one nitro of his own and eliminated pain. Pt has cardiac hx. Pt has slight tightness and was given 1 nitro en route and did not change pain. 102/61 HR 68 and regular. IV 18G in L AC. Pt has no other associated symptoms aside from chest pain CBG 122. Pt taken 324 ASA.

## 2013-05-18 NOTE — Progress Notes (Signed)
ANTICOAGULATION CONSULT NOTE - Initial Consult  Pharmacy Consult for Heparin Indication: chest pain/ACS  Allergies  Allergen Reactions  . Levemir (Insulin Detemir) Swelling    Patient had redness and swelling and tenderness at injection site.  . Codeine Other (See Comments)    GI UPSET & TREMORS    Patient Measurements: Height: 5' 10.5" (179.1 cm) Weight: 254 lb (115.214 kg) IBW/kg (Calculated) : 74.15 Heparin Dosing Weight: 99 kg  Vital Signs: Temp: 97.6 F (36.4 C) (08/07 1242) Temp src: Oral (08/07 1242) BP: 129/64 mmHg (08/07 1608) Pulse Rate: 68 (08/07 1608)  Labs:  Recent Labs  05/18/13 1306 05/18/13 1307  HGB 14.7  --   HCT 40.2  --   PLT 190  --   CREATININE 1.51*  --   TROPONINI  --  <0.30    Estimated Creatinine Clearance: 55.8 ml/min (by C-G formula based on Cr of 1.51).   Medical History: Past Medical History  Diagnosis Date  . Obesity   . Edema   . DM (diabetes mellitus)   . Renal insufficiency      CKD... creatinine up to 1.5 in hospital after dye in October, 2011  . Fatigue   . Nephrolithiasis   . Gout   . Skin cancer   . CAD (coronary artery disease)     stent 2000 /  cath,stent patent 2006 / cath 2007,stent probably patent ,but difficult to assess / cath 07/2010, patent LAD stent with 30-40% ISR, otw nonobs dzs.  . Ventral hernia   . Sinusitis   . Back problem   . Hyperlipidemia   . HTN (hypertension)   . Positive D dimer     significant elevation ,hospital 07/2010, etiology unclear  . Ejection fraction     55%, 07/2010, mild inferior hypo  . SOB (shortness of breath)     mild restrictive defect on PFTs...likely due to obesity and some volume overload  . Dizziness     Mild positional dizziness May, 2012  . History of pneumonia   . Melanoma     Melanoma removed from the left forearm with wide excision May, 2012  . Pneumonia     hx x4 young  . Sleep apnea     CPAP,  [name] since 2000  . OSA (obstructive sleep apnea)    CPAP.. Dr. Craige Cotta  . GERD (gastroesophageal reflux disease)   . H/O hiatal hernia   . Arthritis   . Spinal stenosis     a. s/p surgical repair 2013  . Abnormal laboratory test result     a. D Dimer chronically > 20.    Assessment: 72 y.o. M significant cardiac history that includes previous PCI of the LAD in '00 who presented to the [facility] on 05/18/13 with recurrent CP. Pharmacy was consulted to start heparin for ACS while awaiting further cardiology work-up -- tentatively planning for cath on Fri, 8/8. Initial troponins negative, cycling cardiac enzymes.   The patient's last surgery was on his back in Nov '13 (per patient report), no recent surgeries in the last 3 months. The patient was on a baby ASA PTA -- no other anticoagulants and has no hx CVA. Hep Wt: 99 kg, Baseline Hgb/Hct/Plt wnl.  Goal of Therapy:  Heparin level 0.3-0.7 units/ml Monitor platelets by anticoagulation protocol: Yes   Plan:  1. Heparin 4000 unit bolus x 1 2. Initiate heparin drip at a rate of 1350 units/hr (13.5 ml/hr) 3. Daily heparin levels 4. Will continue to monitor for any  signs/symptoms of bleeding and will follow up with heparin level in 8 hours   Georgina Pillion, PharmD, BCPS Clinical Pharmacist Pager: [phone] 05/18/2013 4:19 PM

## 2013-05-19 ENCOUNTER — Encounter (HOSPITAL_COMMUNITY): Payer: Self-pay | Admitting: *Deleted

## 2013-05-19 ENCOUNTER — Encounter (HOSPITAL_COMMUNITY)
Admission: EM | Disposition: A | Discharge status: Home / Self Care | Payer: Self-pay | Source: Home / Self Care | Attending: Cardiology

## 2013-05-19 DIAGNOSIS — I2510 Atherosclerotic heart disease of native coronary artery without angina pectoris: Secondary | ICD-10-CM

## 2013-05-19 DIAGNOSIS — I517 Cardiomegaly: Secondary | ICD-10-CM

## 2013-05-19 DIAGNOSIS — N289 Disorder of kidney and ureter, unspecified: Secondary | ICD-10-CM

## 2013-05-19 HISTORY — PX: LEFT HEART CATHETERIZATION WITH CORONARY ANGIOGRAM: SHX5451

## 2013-05-19 HISTORY — PX: PERCUTANEOUS CORONARY STENT INTERVENTION (PCI-S): SHX5485

## 2013-05-19 LAB — COMPREHENSIVE METABOLIC PANEL
Albumin: 3.6 g/dL (ref 3.5–5.2)
Alkaline Phosphatase: 57 U/L (ref 39–117)
BUN: 30 mg/dL — ABNORMAL HIGH (ref 6–23)
Calcium: 9.4 mg/dL (ref 8.4–10.5)
Potassium: 3.6 mEq/L (ref 3.5–5.1)
Total Protein: 6.7 g/dL (ref 6.0–8.3)

## 2013-05-19 LAB — LIPID PANEL
Cholesterol: 153 mg/dL (ref 0–200)
HDL: 18 mg/dL — ABNORMAL LOW (ref >39)
Total CHOL/HDL Ratio: 8.5 RATIO
Triglycerides: 479 mg/dL — ABNORMAL HIGH (ref <150)

## 2013-05-19 LAB — POCT ACTIVATED CLOTTING TIME: Activated Clotting Time: 416 seconds

## 2013-05-19 LAB — TROPONIN I: Troponin I: <0.3 ng/mL (ref <0.30)

## 2013-05-19 LAB — HEPARIN LEVEL (UNFRACTIONATED): Heparin Unfractionated: 0.21 IU/mL — ABNORMAL LOW (ref 0.30–0.70)

## 2013-05-19 SURGERY — LEFT HEART CATHETERIZATION WITH CORONARY ANGIOGRAM
Anesthesia: LOCAL

## 2013-05-19 MED ORDER — INSULIN ISOPHANE & REGULAR (HUMAN 70-30)100 UNIT/ML KWIKPEN: 30.0000 [IU] | PEN_INJECTOR | Freq: Every day | SUBCUTANEOUS | Status: DC

## 2013-05-19 MED ORDER — HEPARIN (PORCINE) IN NACL 2-0.9 UNIT/ML-% IJ SOLN
INTRAMUSCULAR | Status: AC
  Filled 2013-05-19: qty 1000

## 2013-05-19 MED ORDER — VERAPAMIL HCL 2.5 MG/ML IV SOLN
INTRAVENOUS | Status: AC
  Filled 2013-05-19: qty 2

## 2013-05-19 MED ORDER — CLOPIDOGREL BISULFATE 300 MG PO TABS
ORAL_TABLET | ORAL | Status: AC
  Filled 2013-05-19: qty 2

## 2013-05-19 MED ORDER — SODIUM CHLORIDE 0.9 % IV SOLN: INTRAVENOUS | Status: DC

## 2013-05-19 MED ORDER — NITROGLYCERIN 0.2 MG/ML ON CALL CATH LAB
INTRAVENOUS | Status: AC
  Filled 2013-05-19: qty 1

## 2013-05-19 MED ORDER — ACETAMINOPHEN 325 MG PO TABS: 650.0000 mg | ORAL_TABLET | ORAL | Status: DC | PRN

## 2013-05-19 MED ORDER — HEPARIN BOLUS VIA INFUSION
2000.0000 [IU] | Freq: Once | INTRAVENOUS | Status: AC
  Administered 2013-05-19: 2000 [IU] via INTRAVENOUS
  Filled 2013-05-19: qty 2000

## 2013-05-19 MED ORDER — CLOPIDOGREL BISULFATE 75 MG PO TABS
75.0000 mg | ORAL_TABLET | Freq: Every day | ORAL | Status: DC
  Administered 2013-05-20: 75 mg via ORAL
  Filled 2013-05-19: qty 1

## 2013-05-19 MED ORDER — SODIUM CHLORIDE 0.9 % IV SOLN
0.2500 mg/kg/h | INTRAVENOUS | Status: AC
  Filled 2013-05-19: qty 250

## 2013-05-19 MED ORDER — MIDAZOLAM HCL 2 MG/2ML IJ SOLN
INTRAMUSCULAR | Status: AC
  Filled 2013-05-19: qty 2

## 2013-05-19 MED ORDER — PERFLUTREN LIPID MICROSPHERE
1.0000 mL | INTRAVENOUS | Status: AC | PRN
  Administered 2013-05-19: 2 mL via INTRAVENOUS
  Filled 2013-05-19: qty 10

## 2013-05-19 MED ORDER — SODIUM CHLORIDE 0.9 % IV SOLN
0.2500 mg/kg/h | INTRAVENOUS | Status: DC
  Filled 2013-05-19: qty 250

## 2013-05-19 MED ORDER — HEPARIN SODIUM (PORCINE) 1000 UNIT/ML IJ SOLN
INTRAMUSCULAR | Status: AC
  Filled 2013-05-19: qty 1

## 2013-05-19 MED ORDER — LIDOCAINE HCL (PF) 1 % IJ SOLN
INTRAMUSCULAR | Status: AC
  Filled 2013-05-19: qty 30

## 2013-05-19 MED ORDER — INSULIN ISOPHANE & REGULAR (HUMAN 70-30)100 UNIT/ML KWIKPEN: 35.0000 [IU] | PEN_INJECTOR | Freq: Every day | SUBCUTANEOUS | Status: DC

## 2013-05-19 MED ORDER — BIVALIRUDIN 250 MG IV SOLR
INTRAVENOUS | Status: AC
  Filled 2013-05-19: qty 250

## 2013-05-19 MED ORDER — FENTANYL CITRATE 0.05 MG/ML IJ SOLN
INTRAMUSCULAR | Status: AC
  Filled 2013-05-19: qty 2

## 2013-05-19 MED ORDER — SODIUM CHLORIDE 0.9 % IV SOLN
INTRAVENOUS | Status: AC
  Administered 2013-05-19: 16:00:00 via INTRAVENOUS

## 2013-05-19 NOTE — Progress Notes (Signed)
ANTICOAGULATION CONSULT NOTE - Follow Up  Pharmacy Consult for Heparin Indication: chest pain/ACS  Allergies  Allergen Reactions  . Levemir (Insulin Detemir) Swelling    Patient had redness and swelling and tenderness at injection site.  . Codeine Other (See Comments)    GI UPSET & TREMORS    Patient Measurements: Height: 5' 10.5" (179.1 cm) Weight: 254 lb (115.214 kg) IBW/kg (Calculated) : 74.15 Heparin Dosing Weight: 99 kg  Vital Signs: Temp: 97.7 F (36.5 C) (08/07 2138) Temp src: Oral (08/07 2138) BP: 113/62 mmHg (08/07 2138) Pulse Rate: 65 (08/07 2138)  Labs:  Recent Labs  05/18/13 1306 05/18/13 1307 05/18/13 1820 05/19/13 0155  HGB 14.7  --   --   --   HCT 40.2  --   --   --   PLT 190  --   --   --   HEPARINUNFRC  --   --   --  0.21*  CREATININE 1.51*  --   --   --   TROPONINI  --  <0.30 <0.30  --     Estimated Creatinine Clearance: 55.8 ml/min (by C-G formula based on Cr of 1.51).   Medical History: Past Medical History  Diagnosis Date  . Obesity   . Edema   . DM (diabetes mellitus)   . Renal insufficiency      CKD... creatinine up to 1.5 in hospital after dye in October, 2011  . Fatigue   . Nephrolithiasis   . Gout   . Skin cancer   . CAD (coronary artery disease)     stent 2000 /  cath,stent patent 2006 / cath 2007,stent probably patent ,but difficult to assess / cath 07/2010, patent LAD stent with 30-40% ISR, otw nonobs dzs.  . Ventral hernia   . Sinusitis   . Back problem   . Hyperlipidemia   . HTN (hypertension)   . Positive D dimer     significant elevation ,hospital 07/2010, etiology unclear  . Ejection fraction     55%, 07/2010, mild inferior hypo  . SOB (shortness of breath)     mild restrictive defect on PFTs...likely due to obesity and some volume overload  . Dizziness     Mild positional dizziness May, 2012  . History of pneumonia   . Melanoma     Melanoma removed from the left forearm with wide excision May, 2012  .  Pneumonia     hx x4 young  . Sleep apnea     CPAP,  [name] since 2000  . OSA (obstructive sleep apnea)      CPAP.. Dr. Craige Cotta  . GERD (gastroesophageal reflux disease)   . H/O hiatal hernia   . Arthritis   . Spinal stenosis     a. s/p surgical repair 2013  . Abnormal laboratory test result     a. D Dimer chronically > 20.    Assessment: 72 y.o. M significant cardiac history that includes previous PCI of the LAD in '00 who presented to the [facility] on 05/18/13 with recurrent CP. Pharmacy was consulted to start heparin for ACS while awaiting further cardiology work-up -- tentatively planning for cath on Fri, 8/8. Initial troponins negative, cycling cardiac enzymes.   The patient's last surgery was on his back in Nov '13 (per patient report), no recent surgeries in the last 3 months. The patient was on a baby ASA PTA -- no other anticoagulants and has no hx CVA. Hep Wt: 99 kg, Baseline Hgb/Hct/Plt wnl.  Initial  heparin level 0.21 units/ml.  Goal of Therapy:  Heparin level 0.3-0.7 units/ml Monitor platelets by anticoagulation protocol: Yes   Plan:  1. Heparin 2000 unit bolus x 1 2. Increase heparin drip to rate of 1550 units/hr 3. Check heparin level 6 hours after rate change 4. Will continue to monitor for any signs/symptoms of bleeding  Talbert Cage, PharmD Clinical Pharmacist Pager: [phone] 05/19/2013 2:49 AM

## 2013-05-19 NOTE — CV Procedure (Signed)
   Cardiac Catheterization Operative Report  William Hendrix [id number] 8/8/20143:19 PM [name], MD  Procedure Performed:  1. PTCA/DES x 1 proximal to mid LAD 2. IVUS proximal/mid LAD   Operator: Verne Carrow, MD  Indication:  72 yo male with history of CAD with prior bare metal stent LAD 2000, last cath 2011 with moderate LAD stent restenosis admitted with class III unstable angina. Diagnostic cath today by Dr.[name] with severe disease in the stented segment of the proximal to mid LAD and just beyond the stent as well as disease in the RCA in the PDA just beyond the bifurcation with the PLA.                                    Procedure Details: The risks, benefits, complications, treatment options, and expected outcomes were discussed with the patient. The patient and/or family concurred with the proposed plan, giving informed consent. When I entered the case the patient had a 5/6 [place] sheath in the right radial artery. He was given Plavix 600 mg po x 1. A weight based bolus of Angiomax was given and a drip was started. When the ACT was over 200, I engaged the left main with a XB LAD 3.5 guiding catheter. I then advanced a BMW wire down the LAD. An IVUS catheter was then advanced over the wire. IVUS imaging showed severe stenosis in the mid LAD beyond the stent (MLA 72mm2) and severe restenosis within the stented segment with MLA under 54mm2. The IVUS catheter was removed and a 2.5 x 15 mm balloon was advanced over the wire and used to pre-dilate the lesion. I then carefully positioned and deployed a 4.0 x 28 mm Promus Premier DES in the proximal LAD from the ostium into the mid segment. The sent was post-dilated with a 4.0 x 21 mm Azle balloon x 2. There was an excellent angiographic result. The stenosis was taken from 80% down to 0%. There were no immediate complications. The patient was taken to the recovery area in stable condition.   Hemodynamic Findings: Central aortic  pressure: see diagnostic report Left ventricular pressure:see diagnostic report  Impression: 1. Successful PTCA/DES x 1 proximal to mid LAD 2. Residual stenosis in right sided PDA involving the bifurcation of the vessel.   Recommendations: Will continue Angiomax at the reduced rate for 2 hours. He will need ASA and Plavix for at least a year. I have elected to medically manage his PDA stenosis at this time. If he has recurrent angina on maximal medical therapy, could consider PCI of the PDA. Plan d/c home tomorrow if he is stable.        Complications:  None; patient tolerated the procedure well.

## 2013-05-19 NOTE — Progress Notes (Signed)
ANTICOAGULATION CONSULT NOTE - Follow Up Consult  Pharmacy Consult for Heparin Indication: chest pain/ACS  Allergies  Allergen Reactions  . Levemir (Insulin Detemir) Swelling    Patient had redness and swelling and tenderness at injection site.  . Codeine Other (See Comments)    GI UPSET & TREMORS    Patient Measurements: Height: 5' 10.5" (179.1 cm) Weight: 257 lb 8 oz (116.8 kg) IBW/kg (Calculated) : 74.15 Heparin Dosing Weight: 99kg  Vital Signs: Temp: 98.5 F (36.9 C) (08/08 0547) Temp src: Oral (08/08 0547) BP: 120/65 mmHg (08/08 0547) Pulse Rate: 71 (08/08 0547)  Labs:  Recent Labs  05/18/13 1306 05/18/13 1307 05/18/13 1820 05/19/13 0155 05/19/13 0900  HGB 14.7  --   --   --   --   HCT 40.2  --   --   --   --   PLT 190  --   --   --   --   HEPARINUNFRC  --   --   --  0.21* 0.39  CREATININE 1.51*  --   --  1.54*  --   TROPONINI  --  <0.30 <0.30 <0.30  --     Estimated Creatinine Clearance: 55.1 ml/min (by C-G formula based on Cr of 1.54).   Medications:  Heparin @ 1550 units/hr  Assessment: 72yom w/ hx CAD s/p PCI continues on heparin for CP. Heparin level is now therapeutic after rate increase this morning. No CBC today. No bleeding reported. For cath today.  Goal of Therapy:  Heparin level 0.3-0.7 units/ml Monitor platelets by anticoagulation protocol: Yes   Plan:  1) Continue heparin at 1550 units/hr 2) Follow up after cath  Fredrik Rigger 05/19/2013,9:51 AM

## 2013-05-19 NOTE — Progress Notes (Signed)
Echocardiogram 2D Echocardiogram with Definity has been performed.  Tyrena Gohr 05/19/2013, 9:52 AM

## 2013-05-19 NOTE — Progress Notes (Addendum)
   Subjective:  Denies CP or dyspnea   Objective:  Filed Vitals:   05/18/13 1608 05/18/13 1738 05/18/13 2138 05/19/13 0547  BP: 129/64 176/84 113/62 120/65  Pulse: 68 70 65 71  Temp:  97.6 F (36.4 C) 97.7 F (36.5 C) 98.5 F (36.9 C)  TempSrc:  Oral Oral Oral  Resp: 23 18 16 18   Height:      Weight:    257 lb 8 oz (116.8 kg)  SpO2: 98% 98% 96% 95%    Intake/Output from previous day:  Intake/Output Summary (Last 24 hours) at 05/19/13 0730 Last data filed at 05/19/13 0500  Gross per 24 hour  Intake      0 ml  Output    525 ml  Net   -525 ml    Physical Exam: Physical exam: Well-developed well-nourished in no acute distress.  Skin is warm and dry.  HEENT is normal.  Neck is supple.  Chest is clear to auscultation with normal expansion.  Cardiovascular exam is regular rate and rhythm.  Abdominal exam nontender or distended. No masses palpated. Extremities show no edema. neuro grossly intact    Lab Results: Basic Metabolic Panel:  Recent Labs  16/10/96 1306 05/19/13 0155  NA 142 138  K 4.1 3.6  CL 102 102  CO2 28 26  GLUCOSE 112* 144*  BUN 35* 30*  CREATININE 1.51* 1.54*  CALCIUM 10.2 9.4   CBC:  Recent Labs  05/18/13 1306  WBC 7.2  NEUTROABS 3.5  HGB 14.7  HCT 40.2  MCV 90.5  PLT 190   Cardiac Enzymes:  Recent Labs  05/18/13 1307 05/18/13 1820 05/19/13 0155  TROPONINI <0.30 <0.30 <0.30     Assessment/Plan:  1 unstable angina-patient is pain-free. Enzymes are negative. Plan to proceed with cardiac catheterization today. The risks and benefits were discussed and the patient agrees to proceed. Note he has baseline renal insufficiency. Hydrate prior to catheterization. No ventriculogram. Limited dye. Follow renal function post procedure. Echocardiogram to assess LV function. Lasix on hold. Continue aspirin, heparin and beta blocker. 2 hypertension-continue present medications. 3 diabetes mellitus 4 stage III chronic kidney  disease-monitor renal function following procedure.  Olga Millers 05/19/2013, 7:30 AM

## 2013-05-19 NOTE — CV Procedure (Signed)
   Cardiac Catheterization Procedure Note  Name: William Hendrix MRN: [id number] DOB: May 01, 1941  Procedure: Left Heart Cath, Selective Coronary Angiography  Indication: Unstable angina   Procedural Details: The right wrist was prepped, draped, and anesthetized with 1% lidocaine. Using the modified Seldinger technique, a 5 French sheath was introduced into the right radial artery. 3 mg of verapamil was administered through the sheath, weight-based unfractionated heparin was administered intravenously. Standard Judkins catheters were used for selective coronary angiography.  No LV gram due to CKD. Catheter exchanges were performed over an exchange length guidewire. There were no immediate procedural complications.The patient will undergo PCI.  Procedural Findings: Hemodynamics: AO 107/58 LV 106/16  Coronary angiography: Coronary dominance: right  Left mainstem: No significant disease.   Left anterior descending (LAD): There is a proximal LAD stent.  The stent appeared somewhat hazy with estimated 50% in-stent restenosis diffusely.  75% stenosis just distal to the stent.    Left circumflex (LCx): Luminal irregularities with 80% stenosis in the far distal LCx (small vessel at that point).   Right coronary artery (RCA): 30% mid RCA stenosis.  There is 90% stenosis at the ostium of a moderate-sized PDA.   Left ventriculography: Not done, CKD.  Final Conclusions:  There is a tight stenosis in the ostial PDA.  There is also worrisome disease involving the proximal LAD stent and just distal to the stent.  The stent appears somewhat hazy and the stenosis distal to the stent appears to be at least 75%.  Plan will be to IVUS the proximal LAD, if signfican disease will interevene on proximal LAD.  In that case, will hydrate and have him walk over the weekend.  If more chest pain, intervene on PDA.  If IVUS does not appear significant, plan intervention to PDA today.   [name] 05/19/2013, 2:35  PM

## 2013-05-19 NOTE — Progress Notes (Signed)
Utilization Review Completed.William Hendrix T8/05/2013  

## 2013-05-19 NOTE — H&P (View-Only) (Signed)
   Subjective:  Denies CP or dyspnea   Objective:  Filed Vitals:   05/18/13 1608 05/18/13 1738 05/18/13 2138 05/19/13 0547  BP: 129/64 176/84 113/62 120/65  Pulse: 68 70 65 71  Temp:  97.6 F (36.4 C) 97.7 F (36.5 C) 98.5 F (36.9 C)  TempSrc:  Oral Oral Oral  Resp: 23 18 16 18  Height:      Weight:    257 lb 8 oz (116.8 kg)  SpO2: 98% 98% 96% 95%    Intake/Output from previous day:  Intake/Output Summary (Last 24 hours) at 05/19/13 0730 Last data filed at 05/19/13 0500  Gross per 24 hour  Intake      0 ml  Output    525 ml  Net   -525 ml    Physical Exam: Physical exam: Well-developed well-nourished in no acute distress.  Skin is warm and dry.  HEENT is normal.  Neck is supple.  Chest is clear to auscultation with normal expansion.  Cardiovascular exam is regular rate and rhythm.  Abdominal exam nontender or distended. No masses palpated. Extremities show no edema. neuro grossly intact    Lab Results: Basic Metabolic Panel:  Recent Labs  05/18/13 1306 05/19/13 0155  NA 142 138  K 4.1 3.6  CL 102 102  CO2 28 26  GLUCOSE 112* 144*  BUN 35* 30*  CREATININE 1.51* 1.54*  CALCIUM 10.2 9.4   CBC:  Recent Labs  05/18/13 1306  WBC 7.2  NEUTROABS 3.5  HGB 14.7  HCT 40.2  MCV 90.5  PLT 190   Cardiac Enzymes:  Recent Labs  05/18/13 1307 05/18/13 1820 05/19/13 0155  TROPONINI <0.30 <0.30 <0.30     Assessment/Plan:  1 unstable angina-patient is pain-free. Enzymes are negative. Plan to proceed with cardiac catheterization today. The risks and benefits were discussed and the patient agrees to proceed. Note he has baseline renal insufficiency. Hydrate prior to catheterization. No ventriculogram. Limited dye. Follow renal function post procedure. Echocardiogram to assess LV function. Lasix on hold. Continue aspirin, heparin and beta blocker. 2 hypertension-continue present medications. 3 diabetes mellitus 4 stage III chronic kidney  disease-monitor renal function following procedure.  Brian Crenshaw 05/19/2013, 7:30 AM    

## 2013-05-19 NOTE — Interval H&P Note (Signed)
History and Physical Interval Note:  05/19/2013 1:39 PM  William Hendrix  has presented today for surgery, with the diagnosis of cp  The various methods of treatment have been discussed with the patient and family. After consideration of risks, benefits and other options for treatment, the patient has consented to  Procedure(s): LEFT HEART CATHETERIZATION WITH CORONARY ANGIOGRAM (N/A) as a surgical intervention .  The patient's history has been reviewed, patient examined, no change in status, stable for surgery.  I have reviewed the patient's chart and labs.  Questions were answered to the patient's satisfaction.     Granite Godman [employer]

## 2013-05-19 NOTE — Progress Notes (Signed)
Patient evaluated for long-term disease management services with [facility] Program as a benefit of his [facility] Medicare. William Hendrix pleasantly declined services.      Raiford Noble, MSN-Ed, RN,BSN, [facility], [phone]

## 2013-05-20 ENCOUNTER — Encounter (HOSPITAL_COMMUNITY): Payer: Self-pay | Admitting: Physician Assistant

## 2013-05-20 DIAGNOSIS — I10 Essential (primary) hypertension: Secondary | ICD-10-CM

## 2013-05-20 DIAGNOSIS — E781 Pure hyperglyceridemia: Secondary | ICD-10-CM

## 2013-05-20 DIAGNOSIS — E785 Hyperlipidemia, unspecified: Secondary | ICD-10-CM

## 2013-05-20 DIAGNOSIS — I2510 Atherosclerotic heart disease of native coronary artery without angina pectoris: Secondary | ICD-10-CM

## 2013-05-20 DIAGNOSIS — R079 Chest pain, unspecified: Secondary | ICD-10-CM

## 2013-05-20 LAB — CBC
MCH: 32.2 pg (ref 26.0–34.0)
MCHC: 35 g/dL (ref 30.0–36.0)
Platelets: 160 10*3/uL (ref 150–400)

## 2013-05-20 LAB — GLUCOSE, CAPILLARY: Glucose-Capillary: 146 mg/dL — ABNORMAL HIGH (ref 70–99)

## 2013-05-20 LAB — BASIC METABOLIC PANEL
BUN: 20 mg/dL (ref 6–23)
Calcium: 9.4 mg/dL (ref 8.4–10.5)
GFR calc non Af Amer: 58 mL/min — ABNORMAL LOW (ref >90)
Glucose, Bld: 155 mg/dL — ABNORMAL HIGH (ref 70–99)

## 2013-05-20 MED ORDER — CLOPIDOGREL BISULFATE 75 MG PO TABS: 75.0000 mg | ORAL_TABLET | Freq: Every day | ORAL | Status: DC

## 2013-05-20 NOTE — Progress Notes (Signed)
CARDIAC REHAB PHASE I   PRE:  Rate/Rhythm: 60 SR    BP: sitting 118/57    SaO2:   MODE:  Ambulation: 450 ft   POST:  Rate/Rhythm: 83 SR    BP: sitting 133/62     SaO2:   Slow pace, much less SOB than normal for him. Sts he has hip and back pain normally that limits his ex. Ed completed. Resistant to too much ex or change in diet. Feels like he watches diet. Not interested in CRPII. 0750-0900   Elissa Lovett [place] CES, ACSM 05/20/2013 8:54 AM

## 2013-05-20 NOTE — Discharge Summary (Signed)
Discharge Summary   Patient ID: William Hendrix MRN: [id number], DOB/AGE: 1941-02-27 72 y.o. Admit date: 05/18/2013 D/C date:     05/20/2013  Primary Cardiologist: William Hendrix  Primary Discharge Diagnoses:  1. CAD/unstable angina  - s/p IVUS-guided PTCA/DES to proximal to mid LAD 05/19/13 - residual stenosis in right sided PDA involving the bifurcation of the vessel, for med rx - if he has recurrent angina on max med therapy, consider PCI of PDA  - history: prior stenting of LAD 2000 2. DM  3. CKD stage III, stable post -cath  4. Dyslipidemia - defer statin initiation to primary cardiologist (see below)  Secondary Discharge Diagnoses:  Past Medical History  Diagnosis Date  . Obesity   . Edema   . Fatigue   . Nephrolithiasis   . Gout   . Skin cancer   . Ventral hernia   . Sinusitis   . Back problem   . Hyperlipidemia   . HTN (hypertension)   . Ejection fraction     55%, 07/2010, mild inferior hypo  . SOB (shortness of breath)     mild restrictive defect on PFTs...likely due to obesity and some volume overload  . Dizziness     Mild positional dizziness May, 2012  . History of pneumonia   . Melanoma     Melanoma removed from the left forearm with wide excision May, 2012  . Pneumonia     hx x4 young  . Sleep apnea     CPAP,  Dr Craige Hendrix since 2000  . GERD (gastroesophageal reflux disease)   . H/O hiatal hernia   . Arthritis   . Spinal stenosis     a. s/p surgical repair 2013  . Positive D-dimer     a. significant elevation ,hospital 07/2010, etiology unclear. b. D Dimer chronically > 20.   Hospital Course: William Hendrix is a 72 y/o M with history of CAD s/p PCI of the LAD in 2000, DM, CKD, HTN who presented to [facility] 05/18/2013 with complaints of chest pain. Over the years, he has noted occasional chest discomfort, typically occurring ~ 1x/month, @ rest, lasting a few mins and resolving spontaneously. He is s/p back surgery in 08/2012 2/2 spinal stenosis and his activity  has been reduced since then. He was painting a school and had no difficulty doing this the day prior to admission, but on the day of admission while using a paint roller, he had sudden onset of severe retrosternal chest pressure associated with dyspnea, nausea, and diaphoresis. He was apparently very pale. He took 1 sl NTG with some relief in about 5-10 mins and upon EMS arrival, he was given additional ntg with further relief. ECG in the ER was non-acute and initial troponin was normal. Symptoms were concerning for [place] with recommendation for cardiac cath. Lasix was held and he was hydrated for this given history of CKD. 2D echo showed mild LVH, EF 55-60%. Diagnostic cath demonstrated hazy ISR of the prox LAD stent and 75% stenosis just distal to the stent, as well as 90% of moderate sized PDA. William Hendrix performed IVUS which showed severe stenosis in the mid LAD beyond the stent (MLA 31mm2) and severe restenosis within the stented segment with MLA under 73mm2. He deployed a 4.0 x 28 mm Promus Premier DES in the proximal LAD from the ostium into the mid segment with subsequent balloon & excellent angiographic result. William Hendrix elected to treat his PDA disease medically citing that he has recurrent  angina on maximal medical therapy, could consider PCI of the PDA. The patient remained stable today. His Cr is stable post-cath. Lasix will be resumed. William Hendrix has seen and examined the patient today and feels he is stable for discharge. Please note the patient has h/o intolerance to Lipitor and thinks William Hendrix did not want him on another statin in the past. Will defer decision regarding this to William Hendrix in followup.   Of note, the patient stated he was planning a trip to [place] on Tuesday. William Hendrix would like our office to check up on him to see how he is feeling before he is cleared for this. I have called our office and left a message for our office to call him on Monday to see how he is feeling - at that point,  his doctor will need to clear him for this trip. William Hendrix stated she would discuss with William Hendrix. I also left a message on our office's scheduling voicemail requesting a follow-up appointment, and our office will call the patient with this appointment.   Discharge Vitals: Blood pressure 118/57, pulse 71, temperature 97.9 F (36.6 C), temperature source Oral, resp. rate 18, height 5' 10.5" (1.791 m), weight 259 lb 0.7 oz (117.5 kg), SpO2 97.00%.  Labs: Lab Results  Component Value Date   WBC 5.9 05/20/2013   HGB 12.8* 05/20/2013   HCT 36.6* 05/20/2013   MCV 92.2 05/20/2013   PLT 160 05/20/2013     Recent Labs Lab 05/19/13 0155 05/20/13 0430  NA 138 139  K 3.6 4.0  CL 102 106  CO2 26 23  BUN 30* 20  CREATININE 1.54* 1.21  CALCIUM 9.4 9.4  PROT 6.7  --   BILITOT 0.3  --   ALKPHOS 57  --   ALT 26  --   AST 25  --   GLUCOSE 144* 155*    Recent Labs  05/18/13 1307 05/18/13 1820 05/19/13 0155  TROPONINI <0.30 <0.30 <0.30   Lab Results  Component Value Date   CHOL 153 05/19/2013   HDL 18* 05/19/2013   LDLCALC UNABLE TO CALCULATE IF TRIGLYCERIDE OVER 400 mg/dL 4/0/9811   TRIG 914* 04/19/2955    Diagnostic Studies/Procedures   Cardiac catheterization this admission, please see full report and above for summary.  Dg Chest 2 View 05/18/2013   *RADIOLOGY REPORT*  Clinical Data: Pain today.  History of hypertension and diabetes.  CHEST - 2 VIEW  Comparison: 08/29/2012 radiographs.  CT 08/04/2010.  Findings: The heart size and mediastinal contours are stable. Coronary artery stent is noted.  The lungs are clear and there is no pleural effusion or pneumothorax.  No acute osseous findings are seen.  Degenerative changes are present throughout the thoracic spine.  IMPRESSION: Stable chest.  No active cardiopulmonary process.   Original Report Authenticated By: William Hendrix, M.D.   2D Echo 05/19/13 - Left ventricle: The cavity size was normal. Wall thickness was increased in a pattern of mild  LVH. Systolic function was normal. The estimated ejection fraction was in the range of 55% to 60%. Wall motion was normal; there were no regional wall motion abnormalities. Doppler parameters are consistent with abnormal left ventricular relaxation (grade 1 diastolic dysfunction). - Right ventricle: The cavity size was mildly dilated.   Discharge Medications     Medication List         acetaminophen 500 MG tablet  Commonly known as:  TYLENOL  Take 1,000 mg by mouth 2 (two) times  daily.     allopurinol 300 MG tablet  Commonly known as:  ZYLOPRIM  Take 1 tablet (300 mg total) by mouth daily.     aspirin EC 81 MG tablet  Take 81 mg by mouth daily.     atenolol 50 MG tablet  Commonly known as:  TENORMIN  Take 1 tablet (50 mg total) by mouth daily.     clopidogrel 75 MG tablet  Commonly known as:  PLAVIX  Take 1 tablet (75 mg total) by mouth daily with breakfast.     fenofibrate 160 MG tablet  Take 1 tablet (160 mg total) by mouth daily.     fluocinonide cream 0.05 %  Commonly known as:  LIDEX  Apply 1 application topically daily as needed. FOR RASH     furosemide 80 MG tablet  Commonly known as:  LASIX  Take 1 tablet (80 mg total) by mouth daily.     glimepiride 2 MG tablet  Commonly known as:  AMARYL  Take 2 mg by mouth daily.     Insulin Isophane & Regular (70-30) 100 UNIT/ML Supn  Commonly known as:  HUMULIN 70/30 KWIKPEN  Inject 30-35 Units into the skin 2 (two) times daily. Inject 35 units in the morning and 30 units in the evening.     nitroGLYCERIN 0.4 MG SL tablet  Commonly known as:  NITROSTAT  Place 1 tablet (0.4 mg total) under the tongue every 5 (five) minutes as needed. FOR CHEST PAIN     pantoprazole 20 MG tablet  Commonly known as:  PROTONIX  Take 20 mg by mouth daily.     Vitamin D 1000 UNITS capsule  Take 1,000 Units by mouth at bedtime.        Disposition   The patient will be discharged in stable condition to home. Discharge Orders     Future Appointments Provider Department Dept Phone   07/31/2013 2:00 PM Carlus Pavlov, MD [facility] PRIMARY [facility] ENDOCRINOLOGY [phone]   Future Orders Complete By Expires     Diet - low sodium heart healthy  As directed     Comments:      Diabetic Diet    Increase activity slowly  As directed     Comments:      No driving for 2 days. OUR OFFICE WILL CALL YOU ON Monday TO SEE HOW YOU ARE FEELING. During that conversation, they will let you know if you are cleared to go to [place]. No lifting over 5 lbs for 1 week. No sexual activity for 1 week. Keep procedure site clean & dry. If you notice increased pain, swelling, bleeding or pus, call/return!  You may shower, but no soaking baths/hot tubs/pools for 1 week.      Follow-up Information   Follow up with [name], MD. (Our office will call you on Monday to see how you are feeling (please call if you haven't heard from [place] by 1pm). Our office will also call you for a follow-up appointment.)    Contact information:   [address] [place] [address] [phone]         Duration of Discharge Encounter: Greater than 30 minutes including physician and PA time.  Signed, Ronie Spies PA-C 05/20/2013, 8:43 AM

## 2013-05-20 NOTE — Progress Notes (Signed)
Patient: William Hendrix / Admit Date: 05/18/2013 / Date of Encounter: 05/20/2013, 8:09 AM   Subjective  Feels well. No CP. Mild SOB while walking with cardiac rehab but significantly improved from before.   Objective   Telemetry: NSR occ PVC Physical Exam: Filed Vitals:   05/20/13 0727  BP: 118/57  Pulse: 71  Temp: 97.9 F (36.6 C)  Resp: 18   General: Well developed, well nourished WM in no acute distress. Head: Normocephalic, atraumatic, sclera non-icteric, no xanthomas, nares are without discharge. Neck: JVD not elevated. Lungs: Clear bilaterally to auscultation without wheezes, rales, or rhonchi. Breathing is unlabored. Heart: RRR S1 S2 without murmurs, rubs, or gallops.  Abdomen: Soft, non-tender, non-distended with normoactive bowel sounds. No hepatomegaly. No rebound/guarding. No obvious abdominal masses. Msk:  Strength and tone appear normal for age. Extremities: No clubbing or cyanosis. No edema.  Distal pedal pulses are 2+ and equal bilaterally. R wrist cath site without complication Neuro: Alert and oriented X 3. Moves all extremities spontaneously. Psych:  Responds to questions appropriately with a normal affect.    Intake/Output Summary (Last 24 hours) at 05/20/13 0809 Last data filed at 05/19/13 2145  Gross per 24 hour  Intake    835 ml  Output    400 ml  Net    435 ml    Inpatient Medications:  . acetaminophen  1,000 mg Oral BID  . allopurinol  300 mg Oral Daily  . aspirin EC  81 mg Oral Daily  . atenolol  50 mg Oral Daily  . cholecalciferol  1,000 Units Oral QHS  . clopidogrel  75 mg Oral Q breakfast  . fenofibrate  160 mg Oral Daily  . glimepiride  2 mg Oral Daily  . insulin aspart  0-15 Units Subcutaneous TID WC  . Insulin Isophane & Regular  30 Units Subcutaneous Q supper  . Insulin Isophane & Regular  35 Units Subcutaneous Q breakfast  . nitroGLYCERIN  1 inch Topical Q6H  . pantoprazole  20 mg Oral Daily  . sodium chloride  3 mL Intravenous Q12H      Labs:  Recent Labs  05/19/13 0155 05/20/13 0430  NA 138 139  K 3.6 4.0  CL 102 106  CO2 26 23  GLUCOSE 144* 155*  BUN 30* 20  CREATININE 1.54* 1.21  CALCIUM 9.4 9.4    Recent Labs  05/19/13 0155  AST 25  ALT 26  ALKPHOS 57  BILITOT 0.3  PROT 6.7  ALBUMIN 3.6    Recent Labs  05/18/13 1306 05/20/13 0430  WBC 7.2 5.9  NEUTROABS 3.5  --   HGB 14.7 12.8*  HCT 40.2 36.6*  MCV 90.5 92.2  PLT 190 160    Recent Labs  05/18/13 1307 05/18/13 1820 05/19/13 0155  TROPONINI <0.30 <0.30 <0.30   Radiology/Studies:  Dg Chest 2 View 05/18/2013   *RADIOLOGY REPORT*  Clinical Data: Pain today.  History of hypertension and diabetes.  CHEST - 2 VIEW  Comparison: 08/29/2012 radiographs.  CT 08/04/2010.  Findings: The heart size and mediastinal contours are stable. Coronary artery stent is noted.  The lungs are clear and there is no pleural effusion or pneumothorax.  No acute osseous findings are seen.  Degenerative changes are present throughout the thoracic spine.  IMPRESSION: Stable chest.  No active cardiopulmonary process.   Original Report Authenticated By: Carey Bullocks, M.D.     Assessment and Plan  1. CAD/unstable angina - s/p IVUS-guided PTCA/DES to proximal to mid LAD -  residual stenosis in right sided PDA involving the bifurcation of the vessel, for med rx - if he has recurrent angina on max med therapy, consider PCI of PDA 2. DM 3. CKD stage III, stable post -cath  4. HTN  Consider resumption of Lasix. Continue ASA x Plavix. Continue fenofibrate and consider statin as outpt. Pt states he thought Dr. Myrtis Ser did not want to have him on one. Will discuss possible DC with MD.  Signed, Ronie Spies PA-C Patient seen and examined  I have amended note to reflect findings.  S/p intervention to [facility]  Plan medical Rx for RCA  Follow   Patient would like to go on trip to [place] and [place].     Will contact patient this Monday  F/U this week before making decision to go.

## 2013-05-20 NOTE — Progress Notes (Signed)
TR BAND REMOVAL  LOCATION:    right radial  DEFLATED PER PROTOCOL:    yes  TIME BAND OFF / DRESSING APPLIED:    2030   SITE UPON ARRIVAL:    Level 0  SITE AFTER BAND REMOVAL:    Level 0  REVERSE ALLEN'S TEST:     positive  CIRCULATION SENSATION AND MOVEMENT:    Within Normal Limits   yes  COMMENTS:   Care instructions reviewed with patient. Patient instructed to limit use of right arm as well as to elevate right arm on pillows. Instructed to notify RN of any signs of pain, discomfort or bleeding. Patient verbalized understanding.

## 2013-05-22 ENCOUNTER — Telehealth: Payer: Self-pay | Admitting: Cardiology

## 2013-05-22 ENCOUNTER — Encounter: Payer: Self-pay | Admitting: Cardiology

## 2013-05-22 NOTE — Progress Notes (Signed)
   The patient was recently hospitalized and received a drug-eluting stent. He did not have an MI. He does have residual disease in another vessel was being carefully followed. Procedure was done through his arm. He was discharged on August 9. He has been planning to take a trip driving with his wife to [place]. Today we'll begin areas with good health care. He feels great.  I had a careful discussion with the patient's wife. I explained to them that we often like for patient's to be in the area after procedures for the convenience of seeing [place] back if there are any difficulties. However he is stable. There is no absolute reason why he cannot carefully be involved in a driving trip. They will stop on a regular basis. He will ask his healthcare she has any difficulties.

## 2013-05-22 NOTE — Telephone Encounter (Signed)
New Prob  Per after hrs voicemail, pt needs to speak with a nurse regarding traveling to [place]. Pt also needs appt but wife did not want to set-up appt until after they spoke with a nurse.

## 2013-05-22 NOTE — Telephone Encounter (Signed)
Spoke with patient's wife who states patient had cardiac cath Friday and wants to travel to [place] by car tomorrow and return after Labor Day.  Patient's wife states they were told at D/C to call our office to report how the patient was feeling today and that we would discuss with Dr. Myrtis Ser regarding whether or not patient could travel.  I reviewed patient's chart and called Dr. Myrtis Ser for advice.  Dr. Myrtis Ser states he will telephone the patient himself and take care of the situation from here.

## 2013-05-23 NOTE — Telephone Encounter (Signed)
I called and spoke with the patient's wife carefully. I dictated a documentation note.

## 2013-06-05 ENCOUNTER — Other Ambulatory Visit: Payer: Self-pay

## 2013-06-05 ENCOUNTER — Telehealth: Payer: Self-pay | Admitting: Internal Medicine

## 2013-06-05 MED ORDER — CLOPIDOGREL BISULFATE 75 MG PO TABS: 75.0000 mg | ORAL_TABLET | Freq: Every day | ORAL | Status: DC

## 2013-06-05 NOTE — Telephone Encounter (Signed)
Yes, this is fine.

## 2013-06-05 NOTE — Telephone Encounter (Signed)
Called pt and advised him that it is ok for him to use the Novolin 70/30. Pt glad to hear that. Pt also wanted Dr Elvera Lennox to know that he has been in the hospital. He had another heart cath done at [facility]. He states his bg levels have come down since having the stint put in. Pt said his bg this morning was 84. Be advised.

## 2013-06-05 NOTE — Telephone Encounter (Signed)
Pt called asking if he can use Novolin instead of Humalin 70/30. Please advise.

## 2013-06-05 NOTE — Telephone Encounter (Signed)
Need to know if he can use Novalin instead humalin 70/30  Please call

## 2013-07-03 ENCOUNTER — Encounter: Payer: Medicare Other | Admitting: Cardiology

## 2013-07-20 ENCOUNTER — Encounter: Payer: Self-pay | Admitting: Cardiology

## 2013-07-21 ENCOUNTER — Encounter: Payer: Self-pay | Admitting: Cardiology

## 2013-07-21 ENCOUNTER — Ambulatory Visit (INDEPENDENT_AMBULATORY_CARE_PROVIDER_SITE_OTHER): Payer: Medicare Other | Admitting: Cardiology

## 2013-07-21 ENCOUNTER — Other Ambulatory Visit: Payer: Self-pay

## 2013-07-21 VITALS — BP 118/70 | HR 78 | Ht 70.5 in | Wt 255.8 lb

## 2013-07-21 DIAGNOSIS — I2510 Atherosclerotic heart disease of native coronary artery without angina pectoris: Secondary | ICD-10-CM

## 2013-07-21 DIAGNOSIS — I10 Essential (primary) hypertension: Secondary | ICD-10-CM

## 2013-07-21 MED ORDER — ISOSORBIDE MONONITRATE ER 30 MG PO TB24: 30.0000 mg | ORAL_TABLET | Freq: Every day | ORAL | Status: DC

## 2013-07-21 MED ORDER — METOPROLOL TARTRATE 50 MG PO TABS: 50.0000 mg | ORAL_TABLET | Freq: Two times a day (BID) | ORAL | Status: DC

## 2013-07-21 NOTE — Assessment & Plan Note (Signed)
Patient had a stent placed to the LAD in August, 2014. He has residual disease of the PDA. He is now having exertional angina. Strong consideration will be given to proceeding with intervention to this area. However the plan was to do this only if he was on maximal medical therapy. It appears we can push a little harder with beta blocker and nitrates. Oh be changing his atenolol, Lopressor 50 mg twice a day. I will also add 30 mg of Imdur. I will see him back next week for followup. We will then decide whether we need to proceed with intervention or not.

## 2013-07-21 NOTE — Patient Instructions (Signed)
**Note De-Identified William Hendrix Obfuscation** Your physician has recommended you make the following change in your medication: stop taking Atenolol and start taking Metoprolol Tartrate 50 mg twice daily and Imdur 30 mg once daily  Your physician recommends that you schedule a follow-up appointment on: 10/14

## 2013-07-21 NOTE — Assessment & Plan Note (Signed)
Blood pressures control today. No change in therapy.  As part of today's evaluation I spent greater than 25 minutes with the patient's overall care. More than half of this has been spent with direct discussion with him. We've had long and careful discussion about whether we should proceed with catheterization or medical therapy at this time. Also as part of today's evaluation of reviewed all the hospital records.

## 2013-07-21 NOTE — Progress Notes (Signed)
HPI  Patient is seen to followup his coronary disease. He had marked unstable angina in August, 2014. He was immediately hospitalized. Drug-eluting stent was placed in the mid LAD. There was residual disease of a right sided PDA with a bifurcation lesion. It was felt most prudent to follow this lesion and only to proceed the patient had symptoms on maximal medical therapy. Since that time he has traveled to [place] and then back. Now he is having exertional angina.  Allergies  Allergen Reactions  . Levemir [Insulin Detemir] Swelling    Patient had redness and swelling and tenderness at injection site.  . Codeine Other (See Comments)    GI UPSET & TREMORS  . Lipitor [Atorvastatin]   . Novolog Mix [Insulin Aspart Prot & Aspart]     Causes skin to swell/itch at injection site    Current Outpatient Prescriptions  Medication Sig Dispense Refill  . acetaminophen (TYLENOL) 500 MG tablet Take 1,000 mg by mouth 2 (two) times daily.      Marland Kitchen allopurinol (ZYLOPRIM) 300 MG tablet Take 1 tablet (300 mg total) by mouth daily.  90 tablet  1  . aspirin EC 81 MG tablet Take 81 mg by mouth daily.      Marland Kitchen atenolol (TENORMIN) 50 MG tablet Take 1 tablet (50 mg total) by mouth daily.  90 tablet  3  . Cholecalciferol (VITAMIN D) 1000 UNITS capsule Take 1,000 Units by mouth at bedtime.       . clopidogrel (PLAVIX) 75 MG tablet Take 1 tablet (75 mg total) by mouth daily with breakfast.  90 tablet  1  . fenofibrate 160 MG tablet Take 1 tablet (160 mg total) by mouth daily.  90 tablet  1  . fluocinonide cream (LIDEX) 0.05 % Apply 1 application topically daily as needed. FOR RASH      . furosemide (LASIX) 80 MG tablet Take 1 tablet (80 mg total) by mouth daily.  90 tablet  3  . glimepiride (AMARYL) 2 MG tablet Take 2 mg by mouth daily.      . Insulin Isophane & Regular (HUMULIN 70/30 KWIKPEN) (70-30) 100 UNIT/ML SUPN Inject 30-35 Units into the skin 2 (two) times daily. Inject 35 units in the morning and 30 units in  the evening.  10 pen  3  . nitroGLYCERIN (NITROSTAT) 0.4 MG SL tablet Place 1 tablet (0.4 mg total) under the tongue every 5 (five) minutes as needed. FOR CHEST PAIN  30 tablet  4  . pantoprazole (PROTONIX) 20 MG tablet Take 20 mg by mouth daily.       No current facility-administered medications for this visit.    History   Social History  . Marital Status: Married    Spouse Name: N/A    Number of Children: N/A  . Years of Education: N/A   Occupational History  . [employer]    Social History Main Topics  . Smoking status: Former Smoker -- 3.00 packs/day for 30 years    Types: Cigarettes    Quit date: 09/17/1996  . Smokeless tobacco: Never Used     Comment: occ drink  . Alcohol Use: No  . Drug Use: No  . Sexual Activity: Not Currently    Birth Control/ Protection: None   Other Topics Concern  . Not on file   Social History Narrative   Married and lives locally with his wife.  [employer].    Family History  Problem Relation Age of Onset  . Prostate cancer    .  Kidney cancer    . Cancer      Bladder cancer  . Coronary artery disease      Past Medical History  Diagnosis Date  . Obesity   . Edema   . DM (diabetes mellitus)   . CKD (chronic kidney disease)      CKD stage III.. creatinine up to 1.5 in hospital after dye in October, 2011  . Fatigue   . Nephrolithiasis   . Gout   . Skin cancer   . CAD (coronary artery disease)     a. Stent 2000. b. Several caths since then with nonobst dz. c. 05/2013: IVUS-guided PTCA/DES to proximal-to-mid LAD, residual PDA disease for med rx unless recurrent angina. EF 55-60%.  . Ventral hernia   . Sinusitis   . Back problem   . Hyperlipidemia   . HTN (hypertension)   . Ejection fraction     55%, 07/2010, mild inferior hypo  . SOB (shortness of breath)     mild restrictive defect on PFTs...likely due to obesity and some volume overload  . Dizziness     Mild positional dizziness May, 2012  . History of pneumonia   . Melanoma       Melanoma removed from the left forearm with wide excision May, 2012  . Pneumonia     hx x4 young  . Sleep apnea     CPAP,  [name] since 2000  . GERD (gastroesophageal reflux disease)   . H/O hiatal hernia   . Arthritis   . Spinal stenosis     a. s/p surgical repair 2013  . Positive D-dimer     a. significant elevation ,hospital 07/2010, etiology unclear. b. D Dimer chronically > 20.    Past Surgical History  Procedure Laterality Date  . Nose surgery    . Cardiac catheterization    . Coronary stent placement  2000    CAD  . Knee arthroscopy  90's    rt  . Cervical disc surgery  90's  . Lumbar laminectomy/decompression microdiscectomy  08/30/2012    Procedure: LUMBAR LAMINECTOMY/DECOMPRESSION MICRODISCECTOMY 2 LEVELS;  Surgeon: Barnett Abu, MD;  Location: [facility];  Service: Neurosurgery;  Laterality: Bilateral;  Bilateral Lumbar three-four Lumbar four-five Laminotomies  . Back surgery      Patient Active Problem List   Diagnosis Date Noted  . Dizziness     Priority: High  . CAD (coronary artery disease)     Priority: High  . Ejection fraction     Priority: High  . SOB (shortness of breath)     Priority: High  . Intermediate coronary syndrome 05/18/2013  . Diarrhea 08/15/2012  . Hypertriglyceridemia 08/15/2012  . Preop cardiovascular exam 08/15/2012  . Spinal stenosis of lumbar region at multiple levels 08/10/2012  . Melanoma   . Renal insufficiency   . Hyperlipidemia   . HTN (hypertension)   . OSA (obstructive sleep apnea)   . Drug therapy   . Positive D dimer   . PARESTHESIA 05/30/2009  . OVERWEIGHT/OBESITY 01/22/2009  . EDEMA 01/22/2009  . DIABETES MELLITUS, TYPE II 04/25/2008  . OTHER ABNORMAL BLOOD CHEMISTRY 04/25/2008  . GOUT 10/21/2007  . DEPRESSION 10/21/2007  . VENTRAL HERNIA 10/21/2007  . HIATAL HERNIA 10/21/2007  . FATIGUE 10/21/2007  . SKIN CANCER, HX OF 10/21/2007  . NEPHROLITHIASIS, HX OF 10/21/2007    ROS   Patient denies fever,  chills, headache, sweats, rash, change in vision, change in hearing, cough, nausea vomiting, urinary symptoms. All  other systems are reviewed and are negative.  PHYSICAL EXAM   Patient is oriented to person time and place. Affect is normal. He is overweight. There is no jugulovenous distention. Lungs are clear. Respiratory effort is nonlabored. Cardiac exam reveals S1 and S2. There no clicks or significant murmurs. The abdomen is soft. There is no peripheral edema. There no musculoskeletal deformities. There are no skin rashes.  Filed Vitals:   07/21/13 1608  BP: 118/70  Pulse: 78  Height: 5' 10.5" (1.791 m)  Weight: 255 lb 12.8 oz (116.03 kg)   EKG is done today and reviewed by me. There sinus rhythm. There is no acute EKG change. There is no change from the past.  ASSESSMENT & PLAN

## 2013-07-25 ENCOUNTER — Ambulatory Visit (INDEPENDENT_AMBULATORY_CARE_PROVIDER_SITE_OTHER): Payer: Medicare Other | Admitting: Cardiology

## 2013-07-25 ENCOUNTER — Encounter: Payer: Self-pay | Admitting: Cardiology

## 2013-07-25 VITALS — BP 124/64 | HR 68 | Ht 70.0 in | Wt 259.0 lb

## 2013-07-25 DIAGNOSIS — Z01810 Encounter for preprocedural cardiovascular examination: Secondary | ICD-10-CM

## 2013-07-25 DIAGNOSIS — R079 Chest pain, unspecified: Secondary | ICD-10-CM

## 2013-07-25 DIAGNOSIS — I2510 Atherosclerotic heart disease of native coronary artery without angina pectoris: Secondary | ICD-10-CM

## 2013-07-25 LAB — BASIC METABOLIC PANEL
BUN: 27 mg/dL — ABNORMAL HIGH (ref 6–23)
Chloride: 102 mEq/L (ref 96–112)
Creatinine, Ser: 1.5 mg/dL (ref 0.4–1.5)
Glucose, Bld: 129 mg/dL — ABNORMAL HIGH (ref 70–99)

## 2013-07-25 LAB — CBC WITH DIFFERENTIAL/PLATELET
Basophils Relative: 0.4 % (ref 0.0–3.0)
Eosinophils Absolute: 0.1 10*3/uL (ref 0.0–0.7)
Hemoglobin: 14.2 g/dL (ref 13.0–17.0)
Lymphocytes Relative: 41.4 % (ref 12.0–46.0)
MCHC: 34.1 g/dL (ref 30.0–36.0)
MCV: 93.5 fl (ref 78.0–100.0)
Neutro Abs: 3.3 10*3/uL (ref 1.4–7.7)
RBC: 4.45 Mil/uL (ref 4.22–5.81)

## 2013-07-25 LAB — PROTIME-INR: Prothrombin Time: 11 s (ref 10.2–12.4)

## 2013-07-25 NOTE — Progress Notes (Signed)
[facility];  HPI  Patient is seen today to followup his coronary disease and chest pain. I saw him last week on July 21, 2013. He had return of exertional chest pain. In August, 2014 he was hospitalized with unstable pain. He received a drug-eluting stent to the mid LAD. There was residual disease of the right sided PDA with a bifurcation lesion. It was felt most prudent to follow this. Unfortunately he has had return of significant exertional symptoms. I saw him last week and adjusted his beta blocker. I added Imdur. Unfortunately he has continued exertional chest discomfort with very minimal exertion.  Allergies  Allergen Reactions  . Levemir [Insulin Detemir] Swelling    Patient had redness and swelling and tenderness at injection site.  . Codeine Other (See Comments)    GI UPSET & TREMORS  . Lipitor [Atorvastatin]   . Novolog Mix [Insulin Aspart Prot & Aspart]     Causes skin to swell/itch at injection site    Current Outpatient Prescriptions  Medication Sig Dispense Refill  . acetaminophen (TYLENOL) 500 MG tablet Take 1,000 mg by mouth 2 (two) times daily.      Marland Kitchen allopurinol (ZYLOPRIM) 300 MG tablet Take 1 tablet (300 mg total) by mouth daily.  90 tablet  1  . aspirin EC 81 MG tablet Take 81 mg by mouth daily.      . Cholecalciferol (VITAMIN D) 1000 UNITS capsule Take 1,000 Units by mouth at bedtime.       . clopidogrel (PLAVIX) 75 MG tablet Take 1 tablet (75 mg total) by mouth daily with breakfast.  90 tablet  1  . fenofibrate 160 MG tablet Take 1 tablet (160 mg total) by mouth daily.  90 tablet  1  . fluocinonide cream (LIDEX) 0.05 % Apply 1 application topically daily as needed. FOR RASH      . furosemide (LASIX) 80 MG tablet Take 1 tablet (80 mg total) by mouth daily.  90 tablet  3  . glimepiride (AMARYL) 2 MG tablet Take 2 mg by mouth daily.      . Insulin Isophane & Regular (HUMULIN 70/30 KWIKPEN) (70-30) 100 UNIT/ML SUPN Inject 30-35 Units into the skin 2 (two) times  daily. Inject 35 units in the morning and 30 units in the evening.  10 pen  3  . isosorbide mononitrate (IMDUR) 30 MG 24 hr tablet Take 1 tablet (30 mg total) by mouth daily.  30 tablet  0  . metoprolol (LOPRESSOR) 50 MG tablet Take 1 tablet (50 mg total) by mouth 2 (two) times daily.  60 tablet  0  . nitroGLYCERIN (NITROSTAT) 0.4 MG SL tablet Place 1 tablet (0.4 mg total) under the tongue every 5 (five) minutes as needed. FOR CHEST PAIN  30 tablet  4  . pantoprazole (PROTONIX) 20 MG tablet Take 20 mg by mouth daily.       No current facility-administered medications for this visit.    History   Social History  . Marital Status: Married    Spouse Name: N/A    Number of Children: N/A  . Years of Education: N/A   Occupational History  . [employer]    Social History Main Topics  . Smoking status: Former Smoker -- 3.00 packs/day for 30 years    Types: Cigarettes    Quit date: 09/17/1996  . Smokeless tobacco: Never Used     Comment: occ drink  . Alcohol Use: No  . Drug Use: No  . Sexual Activity: Not  Currently    Birth Control/ Protection: None   Other Topics Concern  . Not on file   Social History Narrative   Married and lives locally with his wife.  Georganna [employer].    Family History  Problem Relation Age of Onset  . Prostate cancer    . Kidney cancer    . Cancer      Bladder cancer  . Coronary artery disease      Past Medical History  Diagnosis Date  . Obesity   . Edema   . DM (diabetes mellitus)   . CKD (chronic kidney disease)      CKD stage III.. creatinine up to 1.5 in hospital after dye in October, 2011  . Fatigue   . Nephrolithiasis   . Gout   . Skin cancer   . CAD (coronary artery disease)     a. Stent 2000. b. Several caths since then with nonobst dz. c. 05/2013: IVUS-guided PTCA/DES to proximal-to-mid LAD, residual PDA disease for med rx unless recurrent angina. EF 55-60%.  . Ventral hernia   . Sinusitis   . Back problem   . Hyperlipidemia   . HTN  (hypertension)   . Ejection fraction     55%, 07/2010, mild inferior hypo  . SOB (shortness of breath)     mild restrictive defect on PFTs...likely due to obesity and some volume overload  . Dizziness     Mild positional dizziness May, 2012  . History of pneumonia   . Melanoma     Melanoma removed from the left forearm with wide excision May, 2012  . Pneumonia     hx x4 young  . Sleep apnea     CPAP,  [name] since 2000  . GERD (gastroesophageal reflux disease)   . H/O hiatal hernia   . Arthritis   . Spinal stenosis     a. s/p surgical repair 2013  . Positive D-dimer     a. significant elevation ,hospital 07/2010, etiology unclear. b. D Dimer chronically > 20.    Past Surgical History  Procedure Laterality Date  . Nose surgery    . Cardiac catheterization    . Coronary stent placement  2000    CAD  . Knee arthroscopy  90's    rt  . Cervical disc surgery  90's  . Lumbar laminectomy/decompression microdiscectomy  08/30/2012    Procedure: LUMBAR LAMINECTOMY/DECOMPRESSION MICRODISCECTOMY 2 LEVELS;  Surgeon: Barnett Abu, MD;  Location: [facility];  Service: Neurosurgery;  Laterality: Bilateral;  Bilateral Lumbar three-four Lumbar four-five Laminotomies  . Back surgery      Patient Active Problem List   Diagnosis Date Noted  . Dizziness     Priority: High  . CAD (coronary artery disease)     Priority: High  . Ejection fraction     Priority: High  . SOB (shortness of breath)     Priority: High  . Intermediate coronary syndrome 05/18/2013  . Diarrhea 08/15/2012  . Hypertriglyceridemia 08/15/2012  . Preop cardiovascular exam 08/15/2012  . Spinal stenosis of lumbar region at multiple levels 08/10/2012  . Melanoma   . Renal insufficiency   . Hyperlipidemia   . HTN (hypertension)   . OSA (obstructive sleep apnea)   . Drug therapy   . Positive D dimer   . PARESTHESIA 05/30/2009  . OVERWEIGHT/OBESITY 01/22/2009  . EDEMA 01/22/2009  . DIABETES MELLITUS, TYPE II  04/25/2008  . OTHER ABNORMAL BLOOD CHEMISTRY 04/25/2008  . GOUT 10/21/2007  . DEPRESSION 10/21/2007  .  VENTRAL HERNIA 10/21/2007  . HIATAL HERNIA 10/21/2007  . FATIGUE 10/21/2007  . SKIN CANCER, HX OF 10/21/2007  . NEPHROLITHIASIS, HX OF 10/21/2007    ROS   Patient denies fever, chills, headache, sweats, rash, change in vision, change in hearing, cough, nausea vomiting, urinary symptoms. All other systems are reviewed and are negative.  PHYSICAL EXAM  Patient is oriented to person time and place. Affect is normal. He is overweight. He is here with his daughter. There is no jugulovenous distention. Lungs are clear. Respiratory effort is nonlabored. Cardiac exam reveals S1 and S2. There no clicks or significant murmurs. The abdomen is soft. Is no peripheral edema. There no musculoskeletal deformities. There are no skin rashes.  Filed Vitals:   07/25/13 0911  BP: 124/64  Pulse: 68  Height: 5\' 10"  (1.778 m)  Weight: 259 lb (117.482 kg)   EKG was not done today. We did one on July 21, 2013.  ASSESSMENT & PLAN

## 2013-07-25 NOTE — H&P (Signed)
[facility];    history and physical for cardiac catheterization scheduled July 26, 2013   HPI  Patient is seen today to followup his coronary disease and chest pain. I saw him last week on July 21, 2013. He had return of exertional chest pain. In August, 2014 he was hospitalized with unstable pain. He received a drug-eluting stent to the mid LAD. There was residual disease of the right sided PDA with a bifurcation lesion. It was felt most prudent to follow this. Unfortunately he has had return of significant exertional symptoms. I saw him last week and adjusted his beta blocker. I added Imdur. Unfortunately he has continued exertional chest discomfort with very minimal exertion.    Allergies   Allergen  Reactions   .  Levemir [Insulin Detemir]  Swelling       Patient had redness and swelling and tenderness at injection site.   .  Codeine  Other (See Comments)       GI UPSET & TREMORS   .  Lipitor [Atorvastatin]     .  Novolog Mix [Insulin Aspart Prot & Aspart]         Causes skin to swell/itch at injection site         Current Outpatient Prescriptions   Medication  Sig  Dispense  Refill   .  acetaminophen (TYLENOL) 500 MG tablet  Take 1,000 mg by mouth 2 (two) times daily.         Marland Kitchen  allopurinol (ZYLOPRIM) 300 MG tablet  Take 1 tablet (300 mg total) by mouth daily.   90 tablet   1   .  aspirin EC 81 MG tablet  Take 81 mg by mouth daily.         .  Cholecalciferol (VITAMIN D) 1000 UNITS capsule  Take 1,000 Units by mouth at bedtime.          .  clopidogrel (PLAVIX) 75 MG tablet  Take 1 tablet (75 mg total) by mouth daily with breakfast.   90 tablet   1   .  fenofibrate 160 MG tablet  Take 1 tablet (160 mg total) by mouth daily.   90 tablet   1   .  fluocinonide cream (LIDEX) 0.05 %  Apply 1 application topically daily as needed. FOR RASH         .  furosemide (LASIX) 80 MG tablet  Take 1 tablet (80 mg total) by mouth daily.   90 tablet   3   .  glimepiride (AMARYL) 2 MG  tablet  Take 2 mg by mouth daily.         .  Insulin Isophane & Regular (HUMULIN 70/30 KWIKPEN) (70-30) 100 UNIT/ML SUPN  Inject 30-35 Units into the skin 2 (two) times daily. Inject 35 units in the morning and 30 units in the evening.   10 pen   3   .  isosorbide mononitrate (IMDUR) 30 MG 24 hr tablet  Take 1 tablet (30 mg total) by mouth daily.   30 tablet   0   .  metoprolol (LOPRESSOR) 50 MG tablet  Take 1 tablet (50 mg total) by mouth 2 (two) times daily.   60 tablet   0   .  nitroGLYCERIN (NITROSTAT) 0.4 MG SL tablet  Place 1 tablet (0.4 mg total) under the tongue every 5 (five) minutes as needed. FOR CHEST PAIN   30 tablet   4   .  pantoprazole (PROTONIX) 20 MG tablet  Take 20 mg by mouth daily.             No current facility-administered medications for this visit.         History       Social History   .  Marital Status:  Married       Spouse Name:  N/A       Number of Children:  N/A   .  Years of Education:  N/A       Occupational History   .  [employer]         Social History Main Topics   .  Smoking status:  Former Smoker -- 3.00 packs/day for 30 years       Types:  Cigarettes       Quit date:  09/17/1996   .  Smokeless tobacco:  Never Used         Comment: occ drink   .  Alcohol Use:  No   .  Drug Use:  No   .  Sexual Activity:  Not Currently       Birth Control/ Protection:  None       Other Topics  Concern   .  Not on file       Social History Narrative     Married and lives locally with his wife.  [employer]anna Skeans.         Family History   Problem  Relation  Age of Onset   .  Prostate cancer       .  Kidney cancer       .  Cancer           Bladder cancer   .  Coronary artery disease             Past Medical History   Diagnosis  Date   .  Obesity     .  Edema     .  DM (diabetes mellitus)     .  CKD (chronic kidney disease)          CKD stage III.. creatinine up to 1.5 in hospital after dye in October, 2011   .  Fatigue     .  Nephrolithiasis      .  Gout     .  Skin cancer     .  CAD (coronary artery disease)         a. Stent 2000. b. Several caths since then with nonobst dz. c. 05/2013: IVUS-guided PTCA/DES to proximal-to-mid LAD, residual PDA disease for med rx unless recurrent angina. EF 55-60%.   .  Ventral hernia     .  Sinusitis     .  Back problem     .  Hyperlipidemia     .  HTN (hypertension)     .  Ejection fraction         55%, 07/2010, mild inferior hypo   .  SOB (shortness of breath)         mild restrictive defect on PFTs...likely due to obesity and some volume overload   .  Dizziness         Mild positional dizziness May, 2012   .  History of pneumonia     .  Melanoma         Melanoma removed from the left forearm with wide excision May, 2012   .  Pneumonia         hx x4 young   .  Sleep apnea         CPAP,  [name] since 2000   .  GERD (gastroesophageal reflux disease)     .  H/O hiatal hernia     .  Arthritis     .  Spinal stenosis         a. s/p surgical repair 2013   .  Positive D-dimer         a. significant elevation ,hospital 07/2010, etiology unclear. b. D Dimer chronically > 20.         Past Surgical History   Procedure  Laterality  Date   .  Nose surgery       .  Cardiac catheterization       .  Coronary stent placement    2000       CAD   .  Knee arthroscopy    90's       rt   .  Cervical disc surgery    90's   .  Lumbar laminectomy/decompression microdiscectomy    08/30/2012       Procedure: LUMBAR LAMINECTOMY/DECOMPRESSION MICRODISCECTOMY 2 LEVELS;  Surgeon: Barnett Abu, MD;  Location: [facility];  Service: Neurosurgery;  Laterality: Bilateral;  Bilateral Lumbar three-four Lumbar four-five Laminotomies   .  Back surgery             Patient Active Problem List     Diagnosis  Date Noted   .  Dizziness         Priority: High   .  CAD (coronary artery disease)         Priority: High   .  Ejection fraction         Priority: High   .  SOB (shortness of breath)          Priority: High   .  Intermediate coronary syndrome  05/18/2013   .  Diarrhea  08/15/2012   .  Hypertriglyceridemia  08/15/2012   .  Preop cardiovascular exam  08/15/2012   .  Spinal stenosis of lumbar region at multiple levels  08/10/2012   .  Melanoma     .  Renal insufficiency     .  Hyperlipidemia     .  HTN (hypertension)     .  OSA (obstructive sleep apnea)     .  Drug therapy     .  Positive D dimer     .  PARESTHESIA  05/30/2009   .  OVERWEIGHT/OBESITY  01/22/2009   .  EDEMA  01/22/2009   .  DIABETES MELLITUS, TYPE II  04/25/2008   .  OTHER ABNORMAL BLOOD CHEMISTRY  04/25/2008   .  GOUT  10/21/2007   .  DEPRESSION  10/21/2007   .  VENTRAL HERNIA  10/21/2007   .  HIATAL HERNIA  10/21/2007   .  FATIGUE  10/21/2007   .  SKIN CANCER, HX OF  10/21/2007   .  NEPHROLITHIASIS, HX OF  10/21/2007        ROS    Patient denies fever, chills, headache, sweats, rash, change in vision, change in hearing, cough, nausea vomiting, urinary symptoms. All other systems are reviewed and are negative.   PHYSICAL EXAM  Patient is oriented to person time and place. Affect is normal. He is overweight. He is here with his daughter. There is no jugulovenous distention. Lungs are clear. Respiratory effort is nonlabored. Cardiac exam reveals S1 and S2. There no clicks or  significant murmurs. The abdomen is soft. Is no peripheral edema. There no musculoskeletal deformities. There are no skin rashes.    Filed Vitals:     07/25/13 0911   BP:  124/64   Pulse:  68   Height:  5\' 10"  (1.778 m)   Weight:  259 lb (117.482 kg)      EKG was not done today. We did one on July 21, 2013.   ASSESSMENT & PLAN            Ejection fraction - [name], MD at 07/25/2013  9:26 AM    Status: Written Related Problem: Ejection fraction           Ejection fraction was 55-60% by echo August, 2014. The study was technically difficult. Definity contrast was used.           CAD (coronary artery  disease) - [name], MD at 07/25/2013  9:30 AM    Status: Edited Related Problem: CAD (coronary artery disease)           The patient had an intervention in August, 2014. We know he had residual disease. He now has chest discomfort with very minimal exertion. Repeat catheterization is being scheduled for tomorrow. We need to see if there has been a problem developing with the LAD stent site. He also needs another relook at the bifurcation lesion.Marland Kitchen He's having symptoms with minimal exertion. Labs will be drawn today. I will encourage him to liberalize his fluids as the day goes on today. I will also arrange for him to have a right heart cath. The purposes this will be to be sure that he is not having symptoms related to marked elevation in his filling pressures.   As part of today's evaluation I spent greater than 25 minutes with his total care. More than half of a 25 minutes was spent discussing the pros and cons of catheterization with the patient and his daughter. Ultimately we decided to proceed with cath tomorrow.           Revision History

## 2013-07-25 NOTE — Patient Instructions (Signed)
**Note De-identified William Hendrix Obfuscation** Your physician has requested that you have a cardiac catheterization. Cardiac catheterization is used to diagnose and/or treat various heart conditions. Doctors may recommend this procedure for a number of different reasons. The most common reason is to evaluate chest pain. Chest pain can be a symptom of coronary artery disease (CAD), and cardiac catheterization can show whether plaque is narrowing or blocking your heart's arteries. This procedure is also used to evaluate the valves, as well as measure the blood flow and oxygen levels in different parts of your heart. For further information please visit [email or web address]. Please follow instruction sheet, as given.  Your physician recommends that you return for lab work in: today  Your physician recommends that you schedule a follow-up appointment in: after cath   

## 2013-07-25 NOTE — Assessment & Plan Note (Signed)
Ejection fraction was 55-60% by echo August, 2014. The study was technically difficult. Definity contrast was used.

## 2013-07-25 NOTE — Assessment & Plan Note (Addendum)
The patient had an intervention in August, 2014. We know he had residual disease. He now has chest discomfort with very minimal exertion. Repeat catheterization is being scheduled for tomorrow. We need to see if there has been a problem developing with the LAD stent site. He also needs another relook at the bifurcation lesion.William Hendrix He's having symptoms with minimal exertion. Labs will be drawn today. I will encourage him to liberalize his fluids as the day goes on today. I will also arrange for him to have a right heart cath. The purposes this will be to be sure that he is not having symptoms related to marked elevation in his filling pressures.  As part of today's evaluation I spent greater than 25 minutes with his total care. More than half of a 25 minutes was spent discussing the pros and cons of catheterization with the patient and his daughter. Ultimately we decided to proceed with cath tomorrow.

## 2013-07-26 ENCOUNTER — Encounter (HOSPITAL_COMMUNITY)
Admission: RE | Disposition: A | Discharge status: Home / Self Care | Payer: Commercial Managed Care - HMO | Source: Ambulatory Visit | Attending: Cardiovascular Disease

## 2013-07-26 ENCOUNTER — Encounter (HOSPITAL_COMMUNITY): Payer: Self-pay | Admitting: General Practice

## 2013-07-26 ENCOUNTER — Ambulatory Visit (HOSPITAL_COMMUNITY)
Admission: RE | Admit: 2013-07-26 | Discharge: 2013-07-27 | Disposition: A | Discharge status: Home / Self Care | Payer: Medicare Other | Source: Ambulatory Visit | Attending: Cardiovascular Disease | Admitting: Cardiovascular Disease

## 2013-07-26 DIAGNOSIS — Z7902 Long term (current) use of antithrombotics/antiplatelets: Secondary | ICD-10-CM | POA: Insufficient documentation

## 2013-07-26 DIAGNOSIS — I2510 Atherosclerotic heart disease of native coronary artery without angina pectoris: Secondary | ICD-10-CM | POA: Insufficient documentation

## 2013-07-26 DIAGNOSIS — G4733 Obstructive sleep apnea (adult) (pediatric): Secondary | ICD-10-CM

## 2013-07-26 DIAGNOSIS — N183 Chronic kidney disease, stage 3 unspecified: Secondary | ICD-10-CM | POA: Insufficient documentation

## 2013-07-26 DIAGNOSIS — I129 Hypertensive chronic kidney disease with stage 1 through stage 4 chronic kidney disease, or unspecified chronic kidney disease: Secondary | ICD-10-CM | POA: Insufficient documentation

## 2013-07-26 DIAGNOSIS — I10 Essential (primary) hypertension: Secondary | ICD-10-CM

## 2013-07-26 DIAGNOSIS — Z23 Encounter for immunization: Secondary | ICD-10-CM | POA: Insufficient documentation

## 2013-07-26 DIAGNOSIS — R0989 Other specified symptoms and signs involving the circulatory and respiratory systems: Secondary | ICD-10-CM | POA: Insufficient documentation

## 2013-07-26 DIAGNOSIS — E785 Hyperlipidemia, unspecified: Secondary | ICD-10-CM | POA: Insufficient documentation

## 2013-07-26 DIAGNOSIS — R0602 Shortness of breath: Secondary | ICD-10-CM | POA: Diagnosis present

## 2013-07-26 DIAGNOSIS — R0609 Other forms of dyspnea: Secondary | ICD-10-CM | POA: Insufficient documentation

## 2013-07-26 DIAGNOSIS — I25118 Atherosclerotic heart disease of native coronary artery with other forms of angina pectoris: Secondary | ICD-10-CM | POA: Diagnosis present

## 2013-07-26 DIAGNOSIS — E119 Type 2 diabetes mellitus without complications: Secondary | ICD-10-CM | POA: Insufficient documentation

## 2013-07-26 DIAGNOSIS — Z79899 Other long term (current) drug therapy: Secondary | ICD-10-CM | POA: Insufficient documentation

## 2013-07-26 DIAGNOSIS — I200 Unstable angina: Secondary | ICD-10-CM | POA: Insufficient documentation

## 2013-07-26 HISTORY — PX: LEFT AND RIGHT HEART CATHETERIZATION WITH CORONARY ANGIOGRAM: SHX5449

## 2013-07-26 HISTORY — PX: CORONARY ANGIOPLASTY WITH STENT PLACEMENT: SHX49

## 2013-07-26 LAB — POCT I-STAT 3, ART BLOOD GAS (G3+)
Bicarbonate: 24.3 mEq/L — ABNORMAL HIGH (ref 20.0–24.0)
Bicarbonate: 26 mEq/L — ABNORMAL HIGH (ref 20.0–24.0)
O2 Saturation: 64 %
TCO2: 27 mmol/L (ref 0–100)
pCO2 arterial: 44.8 mmHg (ref 35.0–45.0)
pH, Arterial: 7.372 (ref 7.350–7.450)
pH, Arterial: 7.399 (ref 7.350–7.450)
pO2, Arterial: 34 mmHg — CL (ref 80.0–100.0)
pO2, Arterial: 72 mmHg — ABNORMAL LOW (ref 80.0–100.0)

## 2013-07-26 LAB — POCT I-STAT 3, VENOUS BLOOD GAS (G3P V)
O2 Saturation: 61 %
pCO2, Ven: 45.5 mmHg (ref 45.0–50.0)
pH, Ven: 7.374 — ABNORMAL HIGH (ref 7.250–7.300)

## 2013-07-26 LAB — GLUCOSE, CAPILLARY
Glucose-Capillary: 139 mg/dL — ABNORMAL HIGH (ref 70–99)
Glucose-Capillary: 148 mg/dL — ABNORMAL HIGH (ref 70–99)
Glucose-Capillary: 189 mg/dL — ABNORMAL HIGH (ref 70–99)
Glucose-Capillary: 208 mg/dL — ABNORMAL HIGH (ref 70–99)

## 2013-07-26 SURGERY — LEFT AND RIGHT HEART CATHETERIZATION WITH CORONARY ANGIOGRAM
Anesthesia: LOCAL

## 2013-07-26 MED ORDER — ASPIRIN 81 MG PO CHEW: 81.0000 mg | CHEWABLE_TABLET | ORAL | Status: DC

## 2013-07-26 MED ORDER — ASPIRIN 81 MG PO CHEW
CHEWABLE_TABLET | ORAL | Status: AC
  Filled 2013-07-26: qty 1

## 2013-07-26 MED ORDER — SODIUM CHLORIDE 0.9 % IV SOLN
INTRAVENOUS | Status: AC
  Administered 2013-07-26: 18:00:00 via INTRAVENOUS

## 2013-07-26 MED ORDER — CLOPIDOGREL BISULFATE 75 MG PO TABS: 75.0000 mg | ORAL_TABLET | Freq: Every day | ORAL | Status: DC

## 2013-07-26 MED ORDER — DIAZEPAM 5 MG PO TABS
5.0000 mg | ORAL_TABLET | ORAL | Status: AC
  Administered 2013-07-26: 5 mg via ORAL

## 2013-07-26 MED ORDER — INSULIN LISPRO 100 UNIT/ML (KWIKPEN)
0.0000 [IU] | PEN_INJECTOR | Freq: Three times a day (TID) | SUBCUTANEOUS | Status: DC
  Administered 2013-07-26: 21:00:00 3 [IU] via SUBCUTANEOUS
  Filled 2013-07-26: qty 3

## 2013-07-26 MED ORDER — SODIUM CHLORIDE 0.9 % IV SOLN
INTRAVENOUS | Status: DC
  Administered 2013-07-26: 1000 mL via INTRAVENOUS

## 2013-07-26 MED ORDER — INSULIN ASPART 100 UNIT/ML ~~LOC~~ SOLN: 0.0000 [IU] | Freq: Three times a day (TID) | SUBCUTANEOUS | Status: DC

## 2013-07-26 MED ORDER — VERAPAMIL HCL 2.5 MG/ML IV SOLN
INTRAVENOUS | Status: AC
  Filled 2013-07-26: qty 2

## 2013-07-26 MED ORDER — METOPROLOL TARTRATE 50 MG PO TABS
50.0000 mg | ORAL_TABLET | Freq: Two times a day (BID) | ORAL | Status: DC
  Administered 2013-07-26: 17:00:00 50 mg via ORAL
  Filled 2013-07-26 (×3): qty 1

## 2013-07-26 MED ORDER — DIAZEPAM 5 MG PO TABS
ORAL_TABLET | ORAL | Status: AC
  Filled 2013-07-26: qty 1

## 2013-07-26 MED ORDER — ONDANSETRON HCL 4 MG/2ML IJ SOLN: 4.0000 mg | Freq: Four times a day (QID) | INTRAMUSCULAR | Status: DC | PRN

## 2013-07-26 MED ORDER — LIDOCAINE HCL (PF) 1 % IJ SOLN
INTRAMUSCULAR | Status: AC
  Filled 2013-07-26: qty 30

## 2013-07-26 MED ORDER — PANTOPRAZOLE SODIUM 20 MG PO TBEC
20.0000 mg | DELAYED_RELEASE_TABLET | Freq: Every day | ORAL | Status: DC
  Filled 2013-07-26: qty 1

## 2013-07-26 MED ORDER — SODIUM CHLORIDE 0.9 % IV SOLN: 250.0000 mL | INTRAVENOUS | Status: DC | PRN

## 2013-07-26 MED ORDER — NITROGLYCERIN 0.2 MG/ML ON CALL CATH LAB
INTRAVENOUS | Status: AC
  Filled 2013-07-26: qty 1

## 2013-07-26 MED ORDER — SODIUM CHLORIDE 0.9 % IV SOLN
0.2500 mg/kg/h | INTRAVENOUS | Status: DC
  Filled 2013-07-26: qty 250

## 2013-07-26 MED ORDER — ALLOPURINOL 300 MG PO TABS
300.0000 mg | ORAL_TABLET | Freq: Every day | ORAL | Status: DC
  Filled 2013-07-26: qty 1

## 2013-07-26 MED ORDER — ACETAMINOPHEN 500 MG PO TABS
1000.0000 mg | ORAL_TABLET | Freq: Two times a day (BID) | ORAL | Status: DC
  Administered 2013-07-26: 22:00:00 1000 mg via ORAL
  Filled 2013-07-26 (×3): qty 2

## 2013-07-26 MED ORDER — NITROGLYCERIN 0.4 MG SL SUBL: 0.4000 mg | SUBLINGUAL_TABLET | SUBLINGUAL | Status: DC | PRN

## 2013-07-26 MED ORDER — ACETAMINOPHEN 325 MG PO TABS
650.0000 mg | ORAL_TABLET | ORAL | Status: DC | PRN
  Administered 2013-07-26: 16:00:00 650 mg via ORAL
  Filled 2013-07-26: qty 2

## 2013-07-26 MED ORDER — INSULIN ISOPHANE & REGULAR (HUMAN 70-30)100 UNIT/ML KWIKPEN: 30.0000 [IU] | PEN_INJECTOR | Freq: Two times a day (BID) | SUBCUTANEOUS | Status: DC

## 2013-07-26 MED ORDER — GLIMEPIRIDE 2 MG PO TABS
2.0000 mg | ORAL_TABLET | Freq: Every day | ORAL | Status: DC
  Filled 2013-07-26 (×2): qty 1

## 2013-07-26 MED ORDER — INFLUENZA VAC SPLIT QUAD 0.5 ML IM SUSP
0.5000 mL | INTRAMUSCULAR | Status: AC
  Administered 2013-07-27: 0.5 mL via INTRAMUSCULAR
  Filled 2013-07-26: qty 0.5

## 2013-07-26 MED ORDER — SODIUM CHLORIDE 0.9 % IJ SOLN: 3.0000 mL | INTRAMUSCULAR | Status: DC | PRN

## 2013-07-26 MED ORDER — FENTANYL CITRATE 0.05 MG/ML IJ SOLN
50.0000 ug | INTRAMUSCULAR | Status: DC | PRN
  Administered 2013-07-26: 50 ug via INTRAVENOUS

## 2013-07-26 MED ORDER — FENTANYL CITRATE 0.05 MG/ML IJ SOLN
INTRAMUSCULAR | Status: AC
  Filled 2013-07-26: qty 2

## 2013-07-26 MED ORDER — FENOFIBRATE 160 MG PO TABS
160.0000 mg | ORAL_TABLET | Freq: Every day | ORAL | Status: DC
  Filled 2013-07-26: qty 1

## 2013-07-26 MED ORDER — INSULIN NPH ISOPHANE & REGULAR (70-30) 100 UNIT/ML ~~LOC~~ SUSP: 30.0000 [IU] | Freq: Two times a day (BID) | SUBCUTANEOUS | Status: DC

## 2013-07-26 MED ORDER — SODIUM CHLORIDE 0.9 % IJ SOLN: 3.0000 mL | Freq: Two times a day (BID) | INTRAMUSCULAR | Status: DC

## 2013-07-26 MED ORDER — HEPARIN SODIUM (PORCINE) 1000 UNIT/ML IJ SOLN
INTRAMUSCULAR | Status: AC
  Filled 2013-07-26: qty 1

## 2013-07-26 MED ORDER — FUROSEMIDE 80 MG PO TABS
80.0000 mg | ORAL_TABLET | Freq: Every day | ORAL | Status: DC
  Filled 2013-07-26 (×2): qty 1

## 2013-07-26 MED ORDER — CLOPIDOGREL BISULFATE 300 MG PO TABS
ORAL_TABLET | ORAL | Status: AC
  Filled 2013-07-26: qty 1

## 2013-07-26 MED ORDER — ISOSORBIDE MONONITRATE ER 30 MG PO TB24
30.0000 mg | ORAL_TABLET | Freq: Every day | ORAL | Status: DC
  Filled 2013-07-26: qty 1

## 2013-07-26 MED ORDER — ASPIRIN 81 MG PO CHEW
81.0000 mg | CHEWABLE_TABLET | ORAL | Status: AC
  Administered 2013-07-26: 81 mg via ORAL

## 2013-07-26 MED ORDER — HEPARIN (PORCINE) IN NACL 2-0.9 UNIT/ML-% IJ SOLN
INTRAMUSCULAR | Status: AC
  Filled 2013-07-26: qty 1000

## 2013-07-26 MED ORDER — ASPIRIN EC 81 MG PO TBEC
81.0000 mg | DELAYED_RELEASE_TABLET | Freq: Every day | ORAL | Status: DC
  Filled 2013-07-26: qty 1

## 2013-07-26 MED ORDER — BIVALIRUDIN 250 MG IV SOLR
INTRAVENOUS | Status: AC
  Filled 2013-07-26: qty 250

## 2013-07-26 NOTE — CV Procedure (Signed)
Cardiac Catheterization Procedure Note  Name: William Hendrix MRN: [id number] DOB: 12/23/1940  Procedure: Right and Left Heart Cath, Selective Coronary Angiography,  PTCA and stenting of the distal RCA into the ostial right PDA  Indication: Class III angina in spite of maximal antianginal therapy. A right heart cath was requested due to significant dyspnea.  Medications:   Contrast:  150 mL Omnipaque   Procedural Details:pre-existing IV was present in the right antecubital vein. This was exchanged under sterile fashion into a 5 French slender sheath. Right heart catheterization was performed with a 5 French Swan-Ganz catheter in the standard fashion. Cardiac output was calculated by the Fick method.   The right wrist was prepped, draped, and anesthetized with 1% lidocaine. Using the modified Seldinger technique, a 5 French Slender sheath was introduced into the right radial artery. 3 mg of verapamil was administered through the sheath, weight-based unfractionated heparin was administered intravenously. A Jackie catheter was used for selective coronary angiography. Left ventricular pressure was recorded. I elected not to perform left ventricular angiography due to mild chronic kidney disease. Catheter exchanges were performed over an exchange length guidewire. There were no immediate procedural complications.  Procedural Findings:   Procedural Findings:  Hemodynamics RA 7 mmHg RV 26/4 mmHg PA 24/9 mmHg PCWP 10 mmHg LV 115/9 mmHg . LVEDP: 15 mmHg AO 111/59 mmHg  Oxygen saturations: PA 63% AO 94%  Cardiac Output (Fick) 4.84  Cardiac Index (Fick) 2.09   Pulmonary vascular resistance (PVR): 1 Woods units.    Coronary angiography: Coronary dominance: Right   Left Main:  Normal  Left Anterior Descending (LAD):  The vessel is normal in size. A stent is noted extending into the ostium and proximal segment which is patent with no significant restenosis. There is mild 10-20%  disease distally.  1st diagonal (D1):  Normal in size with 30% ostial stenosis.  2nd diagonal (D2):  Normal in size with no significant disease.  3rd diagonal (D3):  Very small in size.  Circumflex (LCx):  Normal in size and nondominant. There is 30-40% proximal stenosis. The rest of the vessel has minor irregularities.  1st obtuse marginal:  Small in size with minor irregularities.  2nd obtuse marginal:  Normal in size with no significant disease.  3rd obtuse marginal:  Normal in size with 20% proximal stenosis.    Right Coronary Artery: Normal in size and dominant. There is a 20% proximal stenosis and 30% mid stenosis. In the distal RCA, there is 50% stenosis before the bifurcation with 90% stenosis in the ostial PDA.   Posterior descending artery: 90% ostial stenosis.  Posterior AV segment: 20% ostial stenosis.  Posterolateral branchs:  No significant disease.  Left ventriculography: Was not performed.  PCI Note:  Following the diagnostic procedure, the decision was made to proceed with PCI.  Weight-based bivalirudin was given for anticoagulation. Once a therapeutic ACT was achieved, a 6 [place] JR 4 guide catheter was inserted.  A run through coronary guidewire was used to cross the lesion in the right PDA. And intuition wire was used to place in the posterior AV segment.  The lesion was predilated with a 2.5 x 8 balloon.  Given that the lesion extended to the distal RCA, I decided to place the stent in the distal RCA extending into the ostial right PDA and jailing the posterior AV groove segment. The lesion was then stented with a 2.5 x 15 Xience stent.  I then removed the intuition wire and the wire the  AV groove artery. I tried to dilate through the stent struts but could not advance a balloon in spite of rewiring in a different location. The stent was postdilated with a 2. 7 5 x 12 noncompliant balloon.  Following PCI, there was 0% residual stenosis and TIMI-3 flow. Final  angiography confirmed an excellent result. There was 40-50% ostial stenosis in the posterior AV groove artery which was left to be treated medically. The patient tolerated the procedure well. There were no immediate procedural complications. A TR band was used for radial hemostasis. The patient was transferred to the post catheterization recovery area for further monitoring.  PCI Data: Vessel - distal RCA into ostial right PDA/Segment - 3 Percent Stenosis (pre)  90 TIMI-flow 3 Stent 2.5 x 15 mm Xience drug-eluting stent Percent Stenosis (post) 0% TIMI-flow (post) 3   Final Conclusions:  1. Normal filling pressure with no evidence of pulmonary hypertension. Mildly reduced cardiac output. 2. Widely patent LAD stent with no significant restenosis. Severe ostial right PDA stenosis extending into the distal RCA. 3. Successful angioplasty and drug-eluting stent placement to the distal RCA extending into ostial right PDA.   Recommendations:  Continue dual antiplatelet therapy for at least one year. I suspect that some of his dyspnea might also be related to COPD.  Lorine Bears MD, [facility] 07/26/2013, 10:12 AM

## 2013-07-26 NOTE — H&P (View-Only) (Signed)
 [facility];    history and physical for cardiac catheterization scheduled July 26, 2013   HPI  Patient is seen today to followup his coronary disease and chest pain. I saw him last week on July 21, 2013. He had return of exertional chest pain. In August, 2014 he was hospitalized with unstable pain. He received a drug-eluting stent to the mid LAD. There was residual disease of the right sided PDA with a bifurcation lesion. It was felt most prudent to follow this. Unfortunately he has had return of significant exertional symptoms. I saw him last week and adjusted his beta blocker. I added Imdur. Unfortunately he has continued exertional chest discomfort with very minimal exertion.    Allergies   Allergen  Reactions   .  Levemir [Insulin Detemir]  Swelling       Patient had redness and swelling and tenderness at injection site.   .  Codeine  Other (See Comments)       GI UPSET & TREMORS   .  Lipitor [Atorvastatin]     .  Novolog Mix [Insulin Aspart Prot & Aspart]         Causes skin to swell/itch at injection site         Current Outpatient Prescriptions   Medication  Sig  Dispense  Refill   .  acetaminophen (TYLENOL) 500 MG tablet  Take 1,000 mg by mouth 2 (two) times daily.         .  allopurinol (ZYLOPRIM) 300 MG tablet  Take 1 tablet (300 mg total) by mouth daily.   90 tablet   1   .  aspirin EC 81 MG tablet  Take 81 mg by mouth daily.         .  Cholecalciferol (VITAMIN D) 1000 UNITS capsule  Take 1,000 Units by mouth at bedtime.          .  clopidogrel (PLAVIX) 75 MG tablet  Take 1 tablet (75 mg total) by mouth daily with breakfast.   90 tablet   1   .  fenofibrate 160 MG tablet  Take 1 tablet (160 mg total) by mouth daily.   90 tablet   1   .  fluocinonide cream (LIDEX) 0.05 %  Apply 1 application topically daily as needed. FOR RASH         .  furosemide (LASIX) 80 MG tablet  Take 1 tablet (80 mg total) by mouth daily.   90 tablet   3   .  glimepiride (AMARYL) 2 MG  tablet  Take 2 mg by mouth daily.         .  Insulin Isophane & Regular (HUMULIN 70/30 KWIKPEN) (70-30) 100 UNIT/ML SUPN  Inject 30-35 Units into the skin 2 (two) times daily. Inject 35 units in the morning and 30 units in the evening.   10 pen   3   .  isosorbide mononitrate (IMDUR) 30 MG 24 hr tablet  Take 1 tablet (30 mg total) by mouth daily.   30 tablet   0   .  metoprolol (LOPRESSOR) 50 MG tablet  Take 1 tablet (50 mg total) by mouth 2 (two) times daily.   60 tablet   0   .  nitroGLYCERIN (NITROSTAT) 0.4 MG SL tablet  Place 1 tablet (0.4 mg total) under the tongue every 5 (five) minutes as needed. FOR CHEST PAIN   30 tablet   4   .  pantoprazole (PROTONIX) 20 MG tablet    Take 20 mg by mouth daily.             No current facility-administered medications for this visit.         History       Social History   .  Marital Status:  Married       Spouse Name:  N/A       Number of Children:  N/A   .  Years of Education:  N/A       Occupational History   .  Painter         Social History Main Topics   .  Smoking status:  Former Smoker -- 3.00 packs/day for 30 years       Types:  Cigarettes       Quit date:  09/17/1996   .  Smokeless tobacco:  Never Used         Comment: occ drink   .  Alcohol Use:  No   .  Drug Use:  No   .  Sexual Activity:  Not Currently       Birth Control/ Protection:  None       Other Topics  Concern   .  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         Family History   Problem  Relation  Age of Onset   .  Prostate cancer       .  Kidney cancer       .  Cancer           Bladder cancer   .  Coronary artery disease             Past Medical History   Diagnosis  Date   .  Obesity     .  Edema     .  DM (diabetes mellitus)     .  CKD (chronic kidney disease)          CKD stage III.. creatinine up to 1.5 in hospital after dye in October, 2011   .  Fatigue     .  Nephrolithiasis      .  Gout     .  Skin cancer     .  CAD (coronary artery disease)         a. Stent 2000. b. Several caths since then with nonobst dz. c. 05/2013: IVUS-guided PTCA/DES to proximal-to-mid LAD, residual PDA disease for med rx unless recurrent angina. EF 55-60%.   .  Ventral hernia     .  Sinusitis     .  Back problem     .  Hyperlipidemia     .  HTN (hypertension)     .  Ejection fraction         55%, 07/2010, mild inferior hypo   .  SOB (shortness of breath)         mild restrictive defect on PFTs...likely due to obesity and some volume overload   .  Dizziness         Mild positional dizziness May, 2012   .  History of pneumonia     .  Melanoma         Melanoma removed from the left forearm with wide excision May, 2012   .  Pneumonia         hx x4 young   .    Sleep apnea         CPAP,  Dr Sood since 2000   .  GERD (gastroesophageal reflux disease)     .  H/O hiatal hernia     .  Arthritis     .  Spinal stenosis         a. s/p surgical repair 2013   .  Positive D-dimer         a. significant elevation ,hospital 07/2010, etiology unclear. b. D Dimer chronically > 20.         Past Surgical History   Procedure  Laterality  Date   .  Nose surgery       .  Cardiac catheterization       .  Coronary stent placement    2000       CAD   .  Knee arthroscopy    90's       rt   .  Cervical disc surgery    90's   .  Lumbar laminectomy/decompression microdiscectomy    08/30/2012       Procedure: LUMBAR LAMINECTOMY/DECOMPRESSION MICRODISCECTOMY 2 LEVELS;  Surgeon: Henry Elsner, MD;  Location: [facility];  Service: Neurosurgery;  Laterality: Bilateral;  Bilateral Lumbar three-four Lumbar four-five Laminotomies   .  Back surgery             Patient Active Problem List     Diagnosis  Date Noted   .  Dizziness         Priority: High   .  CAD (coronary artery disease)         Priority: High   .  Ejection fraction         Priority: High   .  SOB (shortness of breath)          Priority: High   .  Intermediate coronary syndrome  05/18/2013   .  Diarrhea  08/15/2012   .  Hypertriglyceridemia  08/15/2012   .  Preop cardiovascular exam  08/15/2012   .  Spinal stenosis of lumbar region at multiple levels  08/10/2012   .  Melanoma     .  Renal insufficiency     .  Hyperlipidemia     .  HTN (hypertension)     .  OSA (obstructive sleep apnea)     .  Drug therapy     .  Positive D dimer     .  PARESTHESIA  05/30/2009   .  OVERWEIGHT/OBESITY  01/22/2009   .  EDEMA  01/22/2009   .  DIABETES MELLITUS, TYPE II  04/25/2008   .  OTHER ABNORMAL BLOOD CHEMISTRY  04/25/2008   .  GOUT  10/21/2007   .  DEPRESSION  10/21/2007   .  VENTRAL HERNIA  10/21/2007   .  HIATAL HERNIA  10/21/2007   .  FATIGUE  10/21/2007   .  SKIN CANCER, HX OF  10/21/2007   .  NEPHROLITHIASIS, HX OF  10/21/2007        ROS    Patient denies fever, chills, headache, sweats, rash, change in vision, change in hearing, cough, nausea vomiting, urinary symptoms. All other systems are reviewed and are negative.   PHYSICAL EXAM  Patient is oriented to person time and place. Affect is normal. He is overweight. He is here with his daughter. There is no jugulovenous distention. Lungs are clear. Respiratory effort is nonlabored. Cardiac exam reveals S1 and S2. There no clicks or   significant murmurs. The abdomen is soft. Is no peripheral edema. There no musculoskeletal deformities. There are no skin rashes.    Filed Vitals:     07/25/13 0911   BP:  124/64   Pulse:  68   Height:  5' 10" (1.778 m)   Weight:  259 lb (117.482 kg)      EKG was not done today. We did one on July 21, 2013.   ASSESSMENT & PLAN            Ejection fraction - Maaliyah Adolph D Melanni Benway, MD at 07/25/2013  9:26 AM    Status: Written Related Problem: Ejection fraction           Ejection fraction was 55-60% by echo August, 2014. The study was technically difficult. Definity contrast was used.           CAD (coronary artery  disease) - Neelam Tiggs D Reedy Biernat, MD at 07/25/2013  9:30 AM    Status: Edited Related Problem: CAD (coronary artery disease)           The patient had an intervention in August, 2014. We know he had residual disease. He now has chest discomfort with very minimal exertion. Repeat catheterization is being scheduled for tomorrow. We need to see if there has been a problem developing with the LAD stent site. He also needs another relook at the bifurcation lesion.. He's having symptoms with minimal exertion. Labs will be drawn today. I will encourage him to liberalize his fluids as the day goes on today. I will also arrange for him to have a right heart cath. The purposes this will be to be sure that he is not having symptoms related to marked elevation in his filling pressures.   As part of today's evaluation I spent greater than 25 minutes with his total care. More than half of a 25 minutes was spent discussing the pros and cons of catheterization with the patient and his daughter. Ultimately we decided to proceed with cath tomorrow.           Revision History       

## 2013-07-26 NOTE — Progress Notes (Signed)
TR BAND REMOVAL  LOCATION:    right radial  DEFLATED PER PROTOCOL:    yes  TIME BAND OFF / DRESSING APPLIED:    1415   SITE UPON ARRIVAL:    Level 0  SITE AFTER BAND REMOVAL:    Level 0  REVERSE ALLEN'S TEST:     positive  CIRCULATION SENSATION AND MOVEMENT:    Within Normal Limits   yes  COMMENTS:   Tolerated procedure well 

## 2013-07-26 NOTE — Interval H&P Note (Signed)
Cath Lab Visit (complete for each Cath Lab visit)  Clinical Evaluation Leading to the Procedure:   ACS: no  Non-ACS:    Anginal Classification: CCS III  Anti-ischemic medical therapy: Maximal Therapy (2 or more classes of medications)  Non-Invasive Test Results: No non-invasive testing performed  Prior CABG: No previous CABG      History and Physical Interval Note:  07/26/2013 8:50 AM  Ponciano Ort  has presented today for surgery, with the diagnosis of c/p  The various methods of treatment have been discussed with the patient and family. After consideration of risks, benefits and other options for treatment, the patient has consented to  Procedure(s): LEFT AND RIGHT HEART CATHETERIZATION WITH CORONARY ANGIOGRAM (N/A) as a surgical intervention .  The patient's history has been reviewed, patient examined, no change in status, stable for surgery.  I have reviewed the patient's chart and labs.  Questions were answered to the patient's satisfaction.     Lorine Bears

## 2013-07-27 DIAGNOSIS — I10 Essential (primary) hypertension: Secondary | ICD-10-CM

## 2013-07-27 DIAGNOSIS — G4733 Obstructive sleep apnea (adult) (pediatric): Secondary | ICD-10-CM

## 2013-07-27 DIAGNOSIS — I2510 Atherosclerotic heart disease of native coronary artery without angina pectoris: Secondary | ICD-10-CM

## 2013-07-27 DIAGNOSIS — R0602 Shortness of breath: Secondary | ICD-10-CM

## 2013-07-27 DIAGNOSIS — E119 Type 2 diabetes mellitus without complications: Secondary | ICD-10-CM

## 2013-07-27 LAB — BASIC METABOLIC PANEL
BUN: 21 mg/dL (ref 6–23)
CO2: 26 mEq/L (ref 19–32)
Calcium: 9.1 mg/dL (ref 8.4–10.5)
GFR calc non Af Amer: 63 mL/min — ABNORMAL LOW (ref >90)
Glucose, Bld: 184 mg/dL — ABNORMAL HIGH (ref 70–99)
Sodium: 138 mEq/L (ref 135–145)

## 2013-07-27 LAB — CBC
Hemoglobin: 12.6 g/dL — ABNORMAL LOW (ref 13.0–17.0)
MCH: 32.6 pg (ref 26.0–34.0)
MCHC: 36 g/dL (ref 30.0–36.0)
Platelets: 154 10*3/uL (ref 150–400)

## 2013-07-27 LAB — GLUCOSE, CAPILLARY: Glucose-Capillary: 142 mg/dL — ABNORMAL HIGH (ref 70–99)

## 2013-07-27 MED ORDER — NITROGLYCERIN 0.4 MG SL SUBL: 0.4000 mg | SUBLINGUAL_TABLET | SUBLINGUAL | Status: DC | PRN

## 2013-07-27 MED FILL — Sodium Chloride IV Soln 0.9%: INTRAVENOUS | Qty: 50 | Status: AC

## 2013-07-27 NOTE — Progress Notes (Signed)
CARDIAC REHAB PHASE I   PRE:  Rate/Rhythm: 68 SR  BP:  Supine:   Sitting: 162/73  Standing:    SaO2: 98 RA  MODE:  Ambulation: 1000 ft   POST:  Rate/Rhythm: 80 SR  BP:  Supine:   Sitting: 164/72  Standing:   SaO2: 99 RA 0730-0830 Pt tolerated ambulation well without c/o of cp states that he felt a little SOB with walking. RA sats normal before and after walk. BP up before and after walk. Steady gait, slow pace. He admits to not being able to walk due to hip pain. I encouraged him to continue to try. Completed stent discharge education with pt, reviewed. He voices understanding. Pt is not interested in Outpt. CRP.  Melina Copa RN 07/27/2013 8:32 AM

## 2013-07-27 NOTE — Discharge Summary (Signed)
CARDIOLOGY DISCHARGE SUMMARY   Patient ID: William Hendrix MRN: [id number] DOB/AGE: Feb 18, 1941 72 y.o.  Admit date: 07/26/2013 Discharge date: 07/27/2013  Primary Discharge Diagnosis:    Intermediate coronary syndrome - class 3 angina - s/p 2.5 x 15 mm Xience drug-eluting stent to the RCA/PDA  Secondary Discharge Diagnosis:    DIABETES MELLITUS, TYPE II   CAD (coronary artery disease)   Hyperlipidemia   SOB (shortness of breath)  Procedures:  Right and Left Heart Cath, Selective Coronary Angiography, PTCA and stenting of the distal RCA into the ostial right PDA   Hospital Course: William Hendrix is a 72 y.o. male with a history of CAD. He was having exertional chest pain and had a cath in September 2014. Medical therapy for CAD was recommended with PCI reserved for uncontrolled symptoms. He had progression of symptoms and was scheduled for cardiac catheterization. He came to the hospital for the procedure on 07/26/2013.  Results are below. He had a 2.5 x 15 mm Xience drug-eluting stent to the PDA, extending into the RCA. He was also complaining of dyspnea, but right heart pressures were normal. His symptoms may be more due to COPD/OSA. He should follow up with primary care for this.  On 07/27/2013, he was seen by Dr. [place] and by cardiac rehab. He was educated on stent restrictions, exercise guidelines and heart-healthy lifestyle changes. He was ambulating without chest pain or SOB and considered stable for discharge, to follow up as an outpatient.   Labs:  Lab Results  Component Value Date   WBC 5.3 07/27/2013   HGB 12.6* 07/27/2013   HCT 35.0* 07/27/2013   MCV 90.7 07/27/2013   PLT 154 07/27/2013     Recent Labs Lab 07/27/13 0505  NA 138  K 3.9  CL 105  CO2 26  BUN 21  CREATININE 1.13  CALCIUM 9.1  GLUCOSE 184*    Recent Labs  07/25/13 0958  INR 1.0     Cardiac Cath:  07/26/2013 Left Main: Normal  Left Anterior Descending (LAD): The vessel is  normal in size. A stent is noted extending into the ostium and proximal segment which is patent with no significant restenosis. There is mild 10-20% disease distally.  1st diagonal (D1): Normal in size with 30% ostial stenosis.  2nd diagonal (D2): Normal in size with no significant disease.  3rd diagonal (D3): Very small in size.  Circumflex (LCx): Normal in size and nondominant. There is 30-40% proximal stenosis. The rest of the vessel has minor irregularities.  1st obtuse marginal: Small in size with minor irregularities.  2nd obtuse marginal: Normal in size with no significant disease.  3rd obtuse marginal: Normal in size with 20% proximal stenosis.  Right Coronary Artery: Normal in size and dominant. There is a 20% proximal stenosis and 30% mid stenosis. In the distal RCA, there is 50% stenosis before the bifurcation with 90% stenosis in the ostial PDA.  Posterior descending artery: 90% ostial stenosis.  Posterior AV segment: 20% ostial stenosis.  Posterolateral branchs: No significant disease. Left ventriculography: Was not performed. PCI Data:  Vessel - distal RCA into ostial right PDA/Segment - 3  Percent Stenosis (pre) 90  TIMI-flow 3  Stent 2.5 x 15 mm Xience drug-eluting stent  Percent Stenosis (post) 0%  TIMI-flow (post) 3  Final Conclusions:  1. Normal filling pressure with no evidence of pulmonary hypertension. Mildly reduced cardiac output.  2. Widely patent LAD stent with no significant restenosis. Severe ostial right PDA  stenosis extending into the distal RCA.  3. Successful angioplasty and drug-eluting stent placement to the distal RCA extending into ostial right PDA.  EKG:  07/27/2013 SR Vent. rate 64 BPM PR interval 182 ms QRS duration 100 ms QT/QTc 422/435 ms P-R-T axes 60 13 43  FOLLOW UP PLANS AND APPOINTMENTS Allergies  Allergen Reactions  . Levemir [Insulin Detemir] Swelling    Patient had redness and swelling and tenderness at injection site.  . Codeine  Other (See Comments)    GI UPSET & TREMORS  . Lipitor [Atorvastatin]   . Novolog Mix [Insulin Aspart Prot & Aspart]     Causes skin to swell/itch at injection site     Medication List         acetaminophen 500 MG tablet  Commonly known as:  TYLENOL  Take 1,000 mg by mouth 2 (two) times daily.     allopurinol 300 MG tablet  Commonly known as:  ZYLOPRIM  Take 1 tablet (300 mg total) by mouth daily.     aspirin EC 81 MG tablet  Take 81 mg by mouth daily.     clopidogrel 75 MG tablet  Commonly known as:  PLAVIX  Take 1 tablet (75 mg total) by mouth daily with breakfast.     fenofibrate 160 MG tablet  Take 1 tablet (160 mg total) by mouth daily.     fluocinonide cream 0.05 %  Commonly known as:  LIDEX  Apply 1 application topically daily as needed. FOR RASH     furosemide 80 MG tablet  Commonly known as:  LASIX  Take 1 tablet (80 mg total) by mouth daily.     glimepiride 2 MG tablet  Commonly known as:  AMARYL  Take 2 mg by mouth daily.     Insulin Isophane & Regular (70-30) 100 UNIT/ML Supn  Commonly known as:  HUMULIN 70/30 KWIKPEN  Inject 30-35 Units into the skin 2 (two) times daily. Inject 35 units in the morning and 30 units in the evening.     isosorbide mononitrate 30 MG 24 hr tablet  Commonly known as:  IMDUR  Take 1 tablet (30 mg total) by mouth daily.     metoprolol 50 MG tablet  Commonly known as:  LOPRESSOR  Take 1 tablet (50 mg total) by mouth 2 (two) times daily.     nitroGLYCERIN 0.4 MG SL tablet  Commonly known as:  NITROSTAT  Place 1 tablet (0.4 mg total) under the tongue every 5 (five) minutes as needed. FOR CHEST PAIN     pantoprazole 20 MG tablet  Commonly known as:  PROTONIX  Take 20 mg by mouth daily.     Vitamin D 1000 UNITS capsule  Take 1,000 Units by mouth at bedtime.        Discharge Orders   Future Appointments Provider Department Dept Phone   08/01/2013 3:00 PM Carlus Pavlov, MD [facility] Endocrinology  [phone]   08/21/2013 10:45 AM [name], MD [facility] [place] Office [phone]   Future Orders Complete By Expires   Diet - low sodium heart healthy  As directed    Diet Carb Modified  As directed    Increase activity slowly  As directed      Follow-up Information   Follow up with [name], MD On 08/21/2013. (at 10:45 am)    Specialty:  Cardiology   Contact information:   [address] [place] [address] [phone]  BRING ALL MEDICATIONS WITH YOU TO FOLLOW UP APPOINTMENTS  Time spent with patient to include physician time: 38 min Signed: Theodore Demark, PA-C 07/27/2013, 8:42 AM Co-Sign MD

## 2013-07-27 NOTE — Discharge Summary (Signed)
Patient seen and examined and history reviewed. Agree with above findings and plan. See my earlier rounding note.   Theron Arista JordanMD 07/27/2013 11:21 AM

## 2013-07-27 NOTE — Progress Notes (Signed)
   TELEMETRY: Reviewed telemetry pt in NSR: Filed Vitals:   07/26/13 2046 07/27/13 0003 07/27/13 0020 07/27/13 0500  BP: 139/71  111/37 127/41  Pulse: 73 67 66 74  Temp: 97.5 F (36.4 C)  97.4 F (36.3 C) 97.5 F (36.4 C)  TempSrc: Oral  Oral Oral  Resp: 16 18 15 17   Height:      Weight:   255 lb 15.3 oz (116.1 kg)   SpO2: 96% 98% 98% 96%    Intake/Output Summary (Last 24 hours) at 07/27/13 0715 Last data filed at 07/27/13 0200  Gross per 24 hour  Intake 2331.66 ml  Output    970 ml  Net 1361.66 ml    SUBJECTIVE Feels well. No chest pain or SOB.  LABS: Basic Metabolic Panel:  Recent Labs  16/10/96 0958 07/27/13 0505  NA 141 138  K 4.1 3.9  CL 102 105  CO2 29 26  GLUCOSE 129* 184*  BUN 27* 21  CREATININE 1.5 1.13  CALCIUM 9.9 9.1   CBC:  Recent Labs  07/25/13 0958 07/27/13 0505  WBC 6.6 5.3  NEUTROABS 3.3  --   HGB 14.2 12.6*  HCT 41.6 35.0*  MCV 93.5 90.7  PLT 196.0 154   Cardiac Enzymes:  Radiology/Studies:  No results found.  Ecg: NSR, Normal Ecg.  PHYSICAL EXAM General: Well developed, obese, in no acute distress. Head: Normal. Neck: Negative for carotid bruits. JVD not elevated. Lungs: Clear bilaterally to auscultation without wheezes, rales, or rhonchi. Breathing is unlabored. Heart: RRR S1 S2 without murmurs, rubs, or gallops.  Abdomen: Soft, non-tender, non-distended with normoactive bowel sounds. No hepatomegaly.  Msk:  Strength and tone appears normal for age. Extremities: No clubbing, cyanosis or edema.  Distal pedal pulses are 2+ and equal bilaterally. No hematoma at radial site. Neuro: Alert and oriented X 3. Moves all extremities spontaneously. Psych:  Responds to questions appropriately with a normal affect.  ASSESSMENT AND PLAN: 1. CAD s/p stenting of distal RCA/PDA. Prior stenting of LAD. Continue ASA/plavix for one year. OK for DC today. Follow up with Dr. Myrtis Ser in 2 weeks. 2. Dyspnea. Normal right heart pressures and LV  function. May be more related to COPD and OSA. Will see if he improves post PCI. 3. Morbid obesity with OSA. 4. DM type 2 5. CKD stage 3- creatinine stable post cath.  Principal Problem:   Intermediate coronary syndrome Active Problems:   DIABETES MELLITUS, TYPE II   CAD (coronary artery disease)   Hyperlipidemia   SOB (shortness of breath)    Signed, Peter [place] MD,FACC 07/27/2013 7:15 AM

## 2013-07-31 ENCOUNTER — Ambulatory Visit: Payer: Medicare Other | Admitting: Internal Medicine

## 2013-08-01 ENCOUNTER — Encounter: Payer: Self-pay | Admitting: Internal Medicine

## 2013-08-01 ENCOUNTER — Ambulatory Visit (INDEPENDENT_AMBULATORY_CARE_PROVIDER_SITE_OTHER): Payer: Medicare Other | Admitting: Internal Medicine

## 2013-08-01 VITALS — BP 110/60 | HR 96 | Temp 98.2°F | Resp 12 | Wt 259.8 lb

## 2013-08-01 DIAGNOSIS — E119 Type 2 diabetes mellitus without complications: Secondary | ICD-10-CM

## 2013-08-01 LAB — HEMOGLOBIN A1C: Hgb A1c MFr Bld: 6.9 % — ABNORMAL HIGH (ref 4.6–6.5)

## 2013-08-01 MED ORDER — INSULIN ISOPHANE & REGULAR (HUMAN 70-30)100 UNIT/ML KWIKPEN: 30.0000 [IU] | PEN_INJECTOR | Freq: Two times a day (BID) | SUBCUTANEOUS | Status: DC

## 2013-08-01 MED ORDER — INSULIN NPH ISOPHANE & REGULAR (70-30) 100 UNIT/ML ~~LOC~~ SUSP: SUBCUTANEOUS | Status: DC

## 2013-08-01 NOTE — Patient Instructions (Signed)
Please continue the current regimen. Try to use almond milk (unsweetened) - vanilla or coconut flavored. Try to go for a walk every day. Please return in 3 months with your sugar log.

## 2013-08-01 NOTE — Progress Notes (Signed)
Patient ID: William Hendrix, male   DOB: 1941/07/19, 72 y.o.   MRN: [id number]  HPI: William Hendrix is a 72 y.o.-year-old male, returning for f/u for DM2, dx 2009, insulin-dependent, uncontrolled, with complications (CAD, CKD stage 3). He is here with his wife who offers part of the history. Last visit 2 mo ago.  He recently had stenting of distal RCA/PDA. Prior stenting of LAD. He feels much better.  Last hemoglobin A1c was: Lab Results  Component Value Date   HGBA1C 8.8* 02/23/2013  Last HbA1C was 7.4%.  Pt is on a regimen of: - Relion 70/30 35 units in am and 30 (<< 25) units in pm  They could not afford other insulin. We cannot use Metformin 2/2 CKD. We stopped Januvia at last visit >> could not afford ($100/3 mo)  We cannot use Levemir >> developed a severe skin reaction to Levemir  We stopped Amaryl 4 mg at last visit.  He brings his log. He checks 4 times a day, and we reviewed his log together. Sugars are ~ same as before. - am: 169-228 >> 151-192 >> 145-176  - Before lunch 132-295 >> 137-220, one value at 78 (injected 70/30 after he ate b'fast), one value at 272 >> 85-147 - before dinner 124-194 >> 95-200 >> 89-175 - Before bedtime, highest sugars at that day, 175-240 >> 157-179 >> 102 No lows. Lowest sugar was 89,  he does not know whether he has hypoglycemia awareness. Highest sugar was 259, one time, when he was angry.  Pt has chronic kidney disease, last BUN/creatinine was:  Lab Results  Component Value Date   BUN 21 07/27/2013   CREATININE 1.13 07/27/2013  ACR 0.7 at last visit.   Pt's last eye exam was 2 years ago. No DR. Had cataract sx 5-6 years ago.  Denies numbness and tingling in his legs.  PMH: Pt also also has a history of CAD, sees Dr. Myrtis Ser, hypertension, hyperlipidemia, chronic kidney disease stage III, history of nephrolithiasis, obesity, hiatal hernia, obstructive sleep apnea, hyperglyceridemia, melanoma, spinal stenosis.  ROS: Constitutional: +   weight gain, + fatigue - but improved, no subjective hyperthermia/hypothermia; + excessive urination Eyes: no blurry vision, no xerophthalmia ENT: no sore throat, no nodules palpated in throat, no dysphagia/odynophagia, no hoarseness Cardiovascular: no CP/SOB/palpitations/+ leg swelling Respiratory: no cough/SOB Gastrointestinal: no N/V/D/C Musculoskeletal: no muscle/joint aches Skin: no rashes Neurological: no tremors/numbness/tingling/dizziness  I reviewed pt's medications, allergies, PMH, social hx, family hx and no changes required.  PE: BP 110/60  Pulse 96  Temp(Src) 98.2 F (36.8 C) (Oral)  Resp 12  Wt 259 lb 12.8 oz (117.845 kg)  BMI 37.28 kg/m2  SpO2 95% Wt Readings from Last 3 Encounters:  08/01/13 259 lb 12.8 oz (117.845 kg)  07/27/13 255 lb 15.3 oz (116.1 kg)  07/27/13 255 lb 15.3 oz (116.1 kg)  Constitutional: overweight, in NAD Eyes: PERRLA, EOMI, no exophthalmos ENT: moist mucous membranes, no thyromegaly, no cervical lymphadenopathy Cardiovascular: RRR, No MRG Respiratory: CTA B Gastrointestinal: abdomen soft, NT, ND, BS+ Musculoskeletal: no deformities, strength intact in all 4 Skin: moist, warm, no rashes  ASSESSMENT: 1. DM2, insulin-dependent, uncontrolled, with complications - CAD, s/p stents - Dr. Myrtis Ser - CKD stage 3  PLAN:  1. Patient with recently deteriorating diabetes control, improved after addition of NPH/regular Relion insulin 70/30 - his hs and am sugars are still higher than target, but not by much and he plans to start exercising as he is feeling better after his  stents. We also discussed about improving diet. Please continue the current regimen. Try to use almond milk (unsweetened) - vanilla or coconut flavored. Try to go for a walk every day. Please return in 3 months with your sugar log. - will call them with samples of 70/30 insulin - will check a HbA1c - I will see the patient back in 3 months with his sugar log  Office Visit on  08/01/2013  Component Date Value Range Status  . Hemoglobin A1C 08/01/2013 6.9* 4.6 - 6.5 % Final   Glycemic Control Guidelines for People with Diabetes:Non Diabetic:  <6%Goal of Therapy: <7%Additional Action Suggested:  >8%    Excellent! I sent him a message.

## 2013-08-03 ENCOUNTER — Other Ambulatory Visit: Payer: Self-pay | Admitting: *Deleted

## 2013-08-03 MED ORDER — "INSULIN SYRINGE-NEEDLE U-100 31G X 5/16"" 1 ML MISC": Status: DC

## 2013-08-18 ENCOUNTER — Encounter: Payer: Self-pay | Admitting: Cardiology

## 2013-08-21 ENCOUNTER — Encounter: Payer: Self-pay | Admitting: Cardiology

## 2013-08-21 ENCOUNTER — Ambulatory Visit (INDEPENDENT_AMBULATORY_CARE_PROVIDER_SITE_OTHER): Payer: Medicare Other | Admitting: Cardiology

## 2013-08-21 VITALS — BP 110/80 | HR 72 | Ht 70.5 in | Wt 257.0 lb

## 2013-08-21 DIAGNOSIS — E781 Pure hyperglyceridemia: Secondary | ICD-10-CM

## 2013-08-21 DIAGNOSIS — R791 Abnormal coagulation profile: Secondary | ICD-10-CM

## 2013-08-21 DIAGNOSIS — I10 Essential (primary) hypertension: Secondary | ICD-10-CM

## 2013-08-21 DIAGNOSIS — N289 Disorder of kidney and ureter, unspecified: Secondary | ICD-10-CM

## 2013-08-21 DIAGNOSIS — R7989 Other specified abnormal findings of blood chemistry: Secondary | ICD-10-CM

## 2013-08-21 DIAGNOSIS — I2510 Atherosclerotic heart disease of native coronary artery without angina pectoris: Secondary | ICD-10-CM

## 2013-08-21 DIAGNOSIS — R42 Dizziness and giddiness: Secondary | ICD-10-CM

## 2013-08-21 DIAGNOSIS — R0602 Shortness of breath: Secondary | ICD-10-CM

## 2013-08-21 DIAGNOSIS — E785 Hyperlipidemia, unspecified: Secondary | ICD-10-CM

## 2013-08-21 MED ORDER — ISOSORBIDE MONONITRATE ER 30 MG PO TB24: 30.0000 mg | ORAL_TABLET | Freq: Every day | ORAL | Status: DC

## 2013-08-21 MED ORDER — ATORVASTATIN CALCIUM 10 MG PO TABS: 10.0000 mg | ORAL_TABLET | Freq: Every day | ORAL | Status: DC

## 2013-08-21 NOTE — Assessment & Plan Note (Signed)
The patient has a chronically elevated d-dimer. We do not know why. No further workup.

## 2013-08-21 NOTE — Assessment & Plan Note (Signed)
Blood pressure is controlled. No change in therapy. 

## 2013-08-21 NOTE — Patient Instructions (Addendum)
**Note De-Identified Shaguana Love Obfuscation** Your physician has recommended you make the following change in your medication: start taking Lipitor 10 mg at bedtime  Your physician recommends that you return for lab work in: October 02, 2013. Please do not eat or drink after midnight the night before labs are drawn.  Your physician wants you to follow-up in: 6 months. You will receive a reminder letter in the mail two months in advance. If you don't receive a letter, please call our office to schedule the follow-up appointment.

## 2013-08-21 NOTE — Assessment & Plan Note (Signed)
His coronary disease is stable. He received a drug-eluting stent to the distal RCA extending to the ostial right PDA. There is no significant pulmonary hypertension. His coronary status is stable. No change in therapy.

## 2013-08-21 NOTE — Assessment & Plan Note (Signed)
The patient is on fenofibrate for his triglycerides.

## 2013-08-21 NOTE — Assessment & Plan Note (Signed)
I have completed an extensive review of the prior notes. The patient's triglycerides have been treated over time. It is my understanding that we have not used a statin in the past for several possible reasons. One reason is that his LDL may not have been elevated. The second reason is that there was question of some liver abnormality in the past. However, his LFTs continued to be normal. I feel that it is most prudent to try to have some statin on board for this gentleman going forward. I'm going to recommend 10 mg of atorvastatin. We will do early followup of his lipids and liver functions because of the questions from the past.  As part of today's evaluation I spent greater than 25 minutes with the patient's total care. More than half of this time was direct contact with him and his wife reviewing all of the issues.

## 2013-08-21 NOTE — Assessment & Plan Note (Signed)
The patient has multiple reasons to have shortness of breath. He is feeling well at this time. Part of his ischemia symptom is shortness of breath. This is definitely improved since his last intervention.

## 2013-08-21 NOTE — Assessment & Plan Note (Signed)
Fortunately he has received low volume of dye. His renal function is stable.

## 2013-08-21 NOTE — Progress Notes (Signed)
HPI  The patient is seen back in followup coronary disease. I saw him last on August 25, 2013. He had continued exertional shortness of breath and chest tightness. He underwent a coronary intervention on July 26, 2013. He has felt well since then. His activity lead level returned to normal.  Patient's wife had a medical problem with the acute. loss of hearing in her left ear. She is very slowly recovering. She has a significant problem with balance  Allergies  Allergen Reactions  . Levemir [Insulin Detemir] Swelling    Patient had redness and swelling and tenderness at injection site.  . Codeine Other (See Comments)    GI UPSET & TREMORS  . Lipitor [Atorvastatin]   . Novolog Mix [Insulin Aspart Prot & Aspart]     Causes skin to swell/itch at injection site    Current Outpatient Prescriptions  Medication Sig Dispense Refill  . acetaminophen (TYLENOL) 500 MG tablet Take 1,000 mg by mouth 2 (two) times daily.      Marland Kitchen allopurinol (ZYLOPRIM) 300 MG tablet Take 1 tablet (300 mg total) by mouth daily.  90 tablet  1  . aspirin EC 81 MG tablet Take 81 mg by mouth daily.      . Cholecalciferol (VITAMIN D) 1000 UNITS capsule Take 1,000 Units by mouth at bedtime.       . clopidogrel (PLAVIX) 75 MG tablet Take 1 tablet (75 mg total) by mouth daily with breakfast.  90 tablet  1  . fenofibrate 160 MG tablet Take 1 tablet (160 mg total) by mouth daily.  90 tablet  1  . fluocinonide cream (LIDEX) 0.05 % Apply 1 application topically daily as needed. FOR RASH      . furosemide (LASIX) 80 MG tablet Take 1 tablet (80 mg total) by mouth daily.  90 tablet  3  . glimepiride (AMARYL) 2 MG tablet Take 2 mg by mouth daily.      . Insulin Isophane & Regular (HUMULIN 70/30 KWIKPEN) (70-30) 100 UNIT/ML SUPN Inject 30-35 Units into the skin 2 (two) times daily. Inject 35 units in the morning and 30 units in the evening.  10 pen  3  . insulin NPH-regular (NOVOLIN 70/30) (70-30) 100 UNIT/ML injection Inject 35  units in am and 30 units before dinner  50 mL  12  . Insulin Syringe-Needle U-100 (BD INSULIN SYRINGE ULTRAFINE) 31G X 5/16" 1 ML MISC Inject 2 times daily as instructed by physician.  100 each  prn  . isosorbide mononitrate (IMDUR) 30 MG 24 hr tablet Take 1 tablet (30 mg total) by mouth daily.  30 tablet  0  . metoprolol (LOPRESSOR) 50 MG tablet Take 1 tablet (50 mg total) by mouth 2 (two) times daily.  60 tablet  0  . nitroGLYCERIN (NITROSTAT) 0.4 MG SL tablet Place 1 tablet (0.4 mg total) under the tongue every 5 (five) minutes as needed for chest pain.  25 tablet  12  . pantoprazole (PROTONIX) 20 MG tablet Take 20 mg by mouth daily.       No current facility-administered medications for this visit.    History   Social History  . Marital Status: Married    Spouse Name: N/A    Number of Children: N/A  . Years of Education: N/A   Occupational History  . [employer]    Social History Main Topics  . Smoking status: Former Smoker -- 3.00 packs/day for 30 years    Types: Cigarettes    Quit  date: 09/17/1996  . Smokeless tobacco: Never Used     Comment: occ drink  . Alcohol Use: No  . Drug Use: No  . Sexual Activity: Not Currently    Birth Control/ Protection: None   Other Topics Concern  . Not on file   Social History Narrative   Married and lives locally with his wife.  [employer].    Family History  Problem Relation Age of Onset  . Prostate cancer    . Kidney cancer    . Cancer      Bladder cancer  . Coronary artery disease      Past Medical History  Diagnosis Date  . Obesity   . Edema   . DM (diabetes mellitus)   . CKD (chronic kidney disease)      CKD stage III.. creatinine up to 1.5 in hospital after dye in October, 2011  . Fatigue   . Nephrolithiasis   . Gout   . Skin cancer   . CAD (coronary artery disease)     a. Stent 2000. b. Several caths since then with nonobst dz. c. 05/2013: IVUS-guided PTCA/DES to proximal-to-mid LAD, residual PDA disease for med rx  unless recurrent angina. EF 55-60%.  . Ventral hernia   . Sinusitis   . Back problem   . Hyperlipidemia   . HTN (hypertension)   . Ejection fraction     55%, 07/2010, mild inferior hypo  . SOB (shortness of breath)     mild restrictive defect on PFTs...likely due to obesity and some volume overload  . Dizziness     Mild positional dizziness May, 2012  . History of pneumonia   . Melanoma     Melanoma removed from the left forearm with wide excision May, 2012  . Pneumonia     hx x4 young  . Sleep apnea     CPAP,  [name] since 2000  . GERD (gastroesophageal reflux disease)   . H/O hiatal hernia   . Arthritis   . Spinal stenosis     a. s/p surgical repair 2013  . Positive D-dimer     a. significant elevation ,hospital 07/2010, etiology unclear. b. D Dimer chronically > 20.    Past Surgical History  Procedure Laterality Date  . Nose surgery    . Cardiac catheterization    . Coronary stent placement  2000    CAD  . Knee arthroscopy  90's    rt  . Cervical disc surgery  90's  . Lumbar laminectomy/decompression microdiscectomy  08/30/2012    Procedure: LUMBAR LAMINECTOMY/DECOMPRESSION MICRODISCECTOMY 2 LEVELS;  Surgeon: Barnett Abu, MD;  Location: [facility];  Service: Neurosurgery;  Laterality: Bilateral;  Bilateral Lumbar three-four Lumbar four-five Laminotomies  . Back surgery    . Coronary angioplasty with stent placement  07/26/2013    DES to RCA extending to PDA      Patient Active Problem List   Diagnosis Date Noted  . Dizziness     Priority: High  . CAD (coronary artery disease)     Priority: High  . Ejection fraction     Priority: High  . SOB (shortness of breath)     Priority: High  . Intermediate coronary syndrome 05/18/2013  . Diarrhea 08/15/2012  . Hypertriglyceridemia 08/15/2012  . Preop cardiovascular exam 08/15/2012  . Spinal stenosis of lumbar region at multiple levels 08/10/2012  . Melanoma   . Renal insufficiency   . Hyperlipidemia   . HTN  (hypertension)   .  OSA (obstructive sleep apnea)   . Drug therapy   . Positive D dimer   . PARESTHESIA 05/30/2009  . OVERWEIGHT/OBESITY 01/22/2009  . EDEMA 01/22/2009  . Type II or unspecified type diabetes mellitus with unspecified complication, uncontrolled 04/25/2008  . OTHER ABNORMAL BLOOD CHEMISTRY 04/25/2008  . GOUT 10/21/2007  . DEPRESSION 10/21/2007  . VENTRAL HERNIA 10/21/2007  . HIATAL HERNIA 10/21/2007  . FATIGUE 10/21/2007  . SKIN CANCER, HX OF 10/21/2007  . NEPHROLITHIASIS, HX OF 10/21/2007    ROS   Patient denies fever, chills, headache, sweats, rash, change in vision, change in hearing, chest pain, cough, nausea or vomiting, urinary symptoms. All other systems are reviewed and are negative.  PHYSICAL EXAM    Patient is overweight. He's here with his wife. He is oriented to person time and place. Affect is normal. There is no jugulovenous distention. Lungs are clear. Respiratory effort is nonlabored. Cardiac exam reveals S1 and S2. There no clicks or significant murmurs. The abdomen is soft. There is no peripheral edema. There is no musculoskeletal deformities. There are no skin rashes.  Filed Vitals:   08/21/13 1121  BP: 110/80  Pulse: 72  Height: 5' 10.5" (1.791 m)  Weight: 257 lb (116.574 kg)  SpO2: 94%      ASSESSMENT & PLAN

## 2013-08-21 NOTE — Assessment & Plan Note (Signed)
He is not currently having any significant dizziness. No further workup.

## 2013-09-19 ENCOUNTER — Telehealth: Payer: Self-pay | Admitting: Cardiology

## 2013-09-19 MED ORDER — ATORVASTATIN CALCIUM 10 MG PO TABS: 10.0000 mg | ORAL_TABLET | Freq: Every day | ORAL | Status: DC

## 2013-09-19 NOTE — Telephone Encounter (Signed)
Left message to return call.  Pt having blood work drawn as scheduled.  We can send in another refill but the dosage and/or medication may change once lipid profile has been repeated.  He should remain on RX as written for now.

## 2013-09-19 NOTE — Telephone Encounter (Signed)
Aware to continue on same meds until he has repeat labs.  Rx sent into pharmacy.

## 2013-09-19 NOTE — Telephone Encounter (Signed)
New problem   Pt need to know if he need refill on Atorvastatin 10mg  please call pt.

## 2013-10-02 ENCOUNTER — Other Ambulatory Visit (INDEPENDENT_AMBULATORY_CARE_PROVIDER_SITE_OTHER): Payer: Medicare Other

## 2013-10-02 DIAGNOSIS — E785 Hyperlipidemia, unspecified: Secondary | ICD-10-CM

## 2013-10-02 LAB — HEPATIC FUNCTION PANEL
ALT: 28 U/L (ref 0–53)
AST: 26 U/L (ref 0–37)
Albumin: 4.4 g/dL (ref 3.5–5.2)
Bilirubin, Direct: 0.1 mg/dL (ref 0.0–0.3)
Total Protein: 7.6 g/dL (ref 6.0–8.3)

## 2013-10-02 LAB — LIPID PANEL
Cholesterol: 136 mg/dL (ref 0–200)
HDL: 25.5 mg/dL — ABNORMAL LOW (ref >39.00)
Total CHOL/HDL Ratio: 5
VLDL: 80 mg/dL — ABNORMAL HIGH (ref 0.0–40.0)

## 2013-10-16 ENCOUNTER — Other Ambulatory Visit: Payer: Self-pay | Admitting: *Deleted

## 2013-10-16 MED ORDER — CLOPIDOGREL BISULFATE 75 MG PO TABS: 75.0000 mg | ORAL_TABLET | Freq: Every day | ORAL | Status: DC

## 2013-10-16 MED ORDER — FENOFIBRATE 160 MG PO TABS: 160.0000 mg | ORAL_TABLET | Freq: Every day | ORAL | Status: DC

## 2013-10-16 MED ORDER — METOPROLOL TARTRATE 50 MG PO TABS: 50.0000 mg | ORAL_TABLET | Freq: Two times a day (BID) | ORAL | Status: DC

## 2013-10-16 MED ORDER — FUROSEMIDE 80 MG PO TABS: 80.0000 mg | ORAL_TABLET | Freq: Every day | ORAL | Status: DC

## 2013-10-16 MED ORDER — ISOSORBIDE MONONITRATE ER 30 MG PO TB24: 30.0000 mg | ORAL_TABLET | Freq: Every day | ORAL | Status: DC

## 2013-10-16 MED ORDER — ATORVASTATIN CALCIUM 10 MG PO TABS: 10.0000 mg | ORAL_TABLET | Freq: Every day | ORAL | Status: DC

## 2013-10-20 ENCOUNTER — Encounter: Payer: Self-pay | Admitting: Internal Medicine

## 2013-10-20 ENCOUNTER — Ambulatory Visit (INDEPENDENT_AMBULATORY_CARE_PROVIDER_SITE_OTHER): Payer: 59 | Admitting: Internal Medicine

## 2013-10-20 VITALS — BP 118/62 | HR 60 | Temp 98.5°F | Resp 12 | Wt 256.8 lb

## 2013-10-20 DIAGNOSIS — IMO0002 Reserved for concepts with insufficient information to code with codable children: Secondary | ICD-10-CM

## 2013-10-20 DIAGNOSIS — E118 Type 2 diabetes mellitus with unspecified complications: Principal | ICD-10-CM

## 2013-10-20 DIAGNOSIS — E1165 Type 2 diabetes mellitus with hyperglycemia: Secondary | ICD-10-CM

## 2013-10-20 NOTE — Patient Instructions (Signed)
Please increase the 70/30 insulin as follows: - 40 units before breakfast - 15 units before lunch - 30 units before dinner Please let me know how your sugars are doing in ~ 2 weeks. Please return in 1 month with your sugar log.

## 2013-10-20 NOTE — Progress Notes (Signed)
Patient ID: William Hendrix, male   DOB: May 13, 1941, 73 y.o.   MRN: [id number]  HPI: William Hendrix is a 73 y.o.-year-old male, returning for f/u for DM2, dx 2009, insulin-dependent, uncontrolled, with complications (CAD, CKD stage 3). He is here with his wife who offers part of the history. Last visit 2.5 mo ago.  Last hemoglobin A1c was: Lab Results  Component Value Date   HGBA1C 6.9* 08/01/2013   HGBA1C 8.8* 02/23/2013   HGBA1C 7.5* 08/09/2012   Pt is on a regimen of: - Relion 70/30 35 units in am and 30 (<< 25) units in pm   They could not afford other insulin. Has [employer] now. We cannot use Metformin 2/2 CKD. We stopped Januvia at last visit >> could not afford ($100/3 mo)  We cannot use Levemir >> developed a severe skin reaction to Levemir  We stopped Amaryl 4 mg.  He brings his log. He checks 3-4 times a day, and we reviewed his log together. Sugars are much increased, without a clear reason. Upon questioning, their daughter is filling up the insulin syringes for 2 mo ahead and leaves them in their fridge... We discussed that the insulin deteriorates after 28 days if bottle opened.  - am: 169-228 >> 151-192 >> 145-176  >> 200-280 - Before lunch 132-295 >> 137-220 >> 85-147 >> 219-292 - before dinner 124-194 >> 95-200 >> 89-175 >> 191-378 - Before bedtime, highest sugars at that day, 175-240 >> 157-179 >> 102 >> n/c No lows. Lowest sugar was 150,  he does not know whether he has hypoglycemia awareness. Highest sugar was 400.  Pt has chronic kidney disease, last BUN/creatinine was:  Lab Results  Component Value Date   BUN 21 07/27/2013   CREATININE 1.13 07/27/2013  ACR 0.7. Last lipids: Lab Results  Component Value Date   CHOL 136 10/02/2013   HDL 25.50* 10/02/2013   LDLCALC UNABLE TO CALCULATE IF TRIGLYCERIDE OVER 400 mg/dL 05/19/2013   LDLDIRECT 54.8 10/02/2013   TRIG 400.0* 10/02/2013   CHOLHDL 5 10/02/2013   Pt's last eye exam was 2 years ago. No DR. Had cataract sx  5-6 years ago.  Denies numbness and tingling in his legs.  PMH: Pt also also has a history of CAD, sees Dr. Ron Parker, hypertension, hyperlipidemia, chronic kidney disease stage III, history of nephrolithiasis, obesity, hiatal hernia, obstructive sleep apnea, hyperglyceridemia, melanoma, spinal stenosis.  ROS: Constitutional: no  weight gain, no fatigue, no subjective hyperthermia/hypothermia Eyes: no blurry vision, no xerophthalmia ENT: no sore throat, no nodules palpated in throat, no dysphagia/odynophagia, no hoarseness Cardiovascular: no CP/SOB/palpitations/+ leg swelling Respiratory: no cough/SOB Gastrointestinal: no N/V/D/C Musculoskeletal: no muscle/joint aches Skin: no rashes  I reviewed pt's medications, allergies, PMH, social hx, family hx and no changes required.  PE: BP 118/62  Pulse 60  Temp(Src) 98.5 F (36.9 C) (Oral)  Resp 12  Wt 385 lb (174.635 kg)  SpO2 95% Wt Readings from Last 3 Encounters:  10/20/13 256 lb 12.8 oz (116.484 kg)  08/21/13 257 lb (116.574 kg)  08/01/13 259 lb 12.8 oz (117.845 kg)  Constitutional: overweight, in NAD Eyes: PERRLA, EOMI, no exophthalmos ENT: moist mucous membranes, no thyromegaly, no cervical lymphadenopathy Cardiovascular: RRR, No MRG Respiratory: CTA B Gastrointestinal: abdomen soft, NT, ND, BS+ Musculoskeletal: no deformities, strength intact in all 4 Skin: moist, warm, no rashes  ASSESSMENT: 1. DM2, insulin-dependent, uncontrolled, with complications - CAD, s/p stents - Dr. Ron Parker.  He had stenting of distal RCA/PDA in 07/2013. Prior stenting  of LAD in 04/2013. - CKD stage 3  PLAN:  1. Patient with recently deteriorating diabetes control, improved after addition of NPH/regular Relion insulin 70/30, but now with increased sugars in the last 2 mo. I am not sure about the reason, but one of the problems may be old insulins (see HPI). Discussed that the needs a new bottle of insulin after 28 days. Advised that daughter should  only fill syringes for 1 week at a time. I advised them to; Patient Instructions  Please increase the 70/30 insulin as follows: - 40 units before breakfast - 15 units before lunch - 30 units before dinner Please let me know how your sugars are doing in ~ 2 weeks. Please return in 1 month with your sugar log.  However, I advised them to start the new insulins (given today) and continue with the previous regimen x 2 days to see if sugars improve). If not, increase as above. - probably not a glucose test strip problem, since sugar in the office 258. - gave them samples of 70/30 insulin - will check a HbA1c at next visit - I will see the patient back in 1 mo with his sugar log

## 2013-10-24 ENCOUNTER — Ambulatory Visit (INDEPENDENT_AMBULATORY_CARE_PROVIDER_SITE_OTHER)
Admission: RE | Admit: 2013-10-24 | Discharge: 2013-10-24 | Disposition: A | Discharge status: Home / Self Care | Payer: 59 | Source: Ambulatory Visit | Attending: Internal Medicine | Admitting: Internal Medicine

## 2013-10-24 ENCOUNTER — Ambulatory Visit (INDEPENDENT_AMBULATORY_CARE_PROVIDER_SITE_OTHER): Payer: 59 | Admitting: Internal Medicine

## 2013-10-24 ENCOUNTER — Other Ambulatory Visit: Payer: Self-pay | Admitting: Internal Medicine

## 2013-10-24 ENCOUNTER — Encounter: Payer: Self-pay | Admitting: Internal Medicine

## 2013-10-24 VITALS — BP 122/70 | HR 66 | Temp 98.3°F | Wt 258.6 lb

## 2013-10-24 DIAGNOSIS — E1165 Type 2 diabetes mellitus with hyperglycemia: Secondary | ICD-10-CM

## 2013-10-24 DIAGNOSIS — I739 Peripheral vascular disease, unspecified: Secondary | ICD-10-CM

## 2013-10-24 DIAGNOSIS — IMO0002 Reserved for concepts with insufficient information to code with codable children: Secondary | ICD-10-CM

## 2013-10-24 DIAGNOSIS — E118 Type 2 diabetes mellitus with unspecified complications: Secondary | ICD-10-CM

## 2013-10-24 DIAGNOSIS — M25559 Pain in unspecified hip: Secondary | ICD-10-CM

## 2013-10-24 DIAGNOSIS — N401 Enlarged prostate with lower urinary tract symptoms: Secondary | ICD-10-CM

## 2013-10-24 DIAGNOSIS — N138 Other obstructive and reflux uropathy: Secondary | ICD-10-CM

## 2013-10-24 DIAGNOSIS — M25551 Pain in right hip: Secondary | ICD-10-CM

## 2013-10-24 DIAGNOSIS — M25552 Pain in left hip: Principal | ICD-10-CM

## 2013-10-24 DIAGNOSIS — I10 Essential (primary) hypertension: Secondary | ICD-10-CM

## 2013-10-24 MED ORDER — ALLOPURINOL 300 MG PO TABS: 300.0000 mg | ORAL_TABLET | Freq: Every day | ORAL | Status: DC

## 2013-10-24 NOTE — Progress Notes (Signed)
Subjective:    Patient ID: William Hendrix, male    DOB: 1941-05-02, 73 y.o.   MRN: [id number]  HPI William Hendrix presents for hip pain bilaterally. With walking he will have sharp pain - like a hot poker - and he has to stop and rest. He will recover in several minutes and will be able to walk another short distance. He has had no injury. He does not have constant pain in the hips and does well at rest.  William Hendrix reports that when he takes furosemide he will have incontinence in the succeeding 6 or so hours. On questioning he does have several classic symptoms of prostatism.  PMH, FamHx and SocHx reviewed for any changes and relevance. Current Outpatient Prescriptions on File Prior to Visit  Medication Sig Dispense Refill  . acetaminophen (TYLENOL) 500 MG tablet Take 1,000 mg by mouth 2 (two) times daily.      Marland Kitchen aspirin EC 81 MG tablet Take 81 mg by mouth daily.      Marland Kitchen atorvastatin (LIPITOR) 10 MG tablet Take 1 tablet (10 mg total) by mouth daily.  90 tablet  1  . Cholecalciferol (VITAMIN D) 1000 UNITS capsule Take 1,000 Units by mouth at bedtime.       . clopidogrel (PLAVIX) 75 MG tablet Take 1 tablet (75 mg total) by mouth daily with breakfast.  90 tablet  1  . fenofibrate 160 MG tablet Take 1 tablet (160 mg total) by mouth daily.  90 tablet  1  . fluocinonide cream (LIDEX) 1.51 % Apply 1 application topically daily as needed. FOR RASH      . furosemide (LASIX) 80 MG tablet Take 1 tablet (80 mg total) by mouth daily.  90 tablet  1  . Insulin Isophane & Regular (HUMULIN 70/30 KWIKPEN) (70-30) 100 UNIT/ML SUPN Inject 30-35 Units into the skin 2 (two) times daily. Inject 35 units in the morning and 30 units in the evening.  10 pen  3  . insulin NPH-regular (NOVOLIN 70/30) (70-30) 100 UNIT/ML injection Inject 35 units in am and 30 units before dinner  50 mL  12  . Insulin Syringe-Needle U-100 (BD INSULIN SYRINGE ULTRAFINE) 31G X 5/16" 1 ML MISC Inject 2 times daily as instructed by  physician.  100 each  prn  . isosorbide mononitrate (IMDUR) 30 MG 24 hr tablet Take 1 tablet (30 mg total) by mouth daily.  90 tablet  1  . metoprolol (LOPRESSOR) 50 MG tablet Take 1 tablet (50 mg total) by mouth 2 (two) times daily.  180 tablet  1  . nitroGLYCERIN (NITROSTAT) 0.4 MG SL tablet Place 1 tablet (0.4 mg total) under the tongue every 5 (five) minutes as needed for chest pain.  25 tablet  12  . pantoprazole (PROTONIX) 20 MG tablet Take 20 mg by mouth daily.       No current facility-administered medications on file prior to visit.      Review of Systems System review is negative for any constitutional, cardiac, pulmonary, GI or neuro symptoms or complaints other than as described in the HPI.     Objective:   Physical Exam Filed Vitals:   10/24/13 1005  BP: 122/70  Pulse: 66  Temp: 98.3 F (36.8 C)   Wt Readings from Last 3 Encounters:  10/24/13 258 lb 9.6 oz (117.3 kg)  10/20/13 256 lb 12.8 oz (116.484 kg)  08/21/13 257 lb (116.574 kg)   BP Readings from Last 3 Encounters:  10/24/13 122/70  10/20/13 118/62  08/21/13 110/80   Gen'l- overweight man in no acute distress HEENT - C&S clear Cor 2+ radial pulse, RRR Pulm - no increased WOB, lungs - CTAP Abd - obese, BS+, soft MSK - minimal tenderness at the hips with internal or external rotation. Neuro - alert and oriented.  Bilateral hip and pelvic X-ray: FINDINGS:  The bony pelvis appears adequately mineralized. No lytic nor blastic  lesion is demonstrated. The hip joint spaces are preserved. AP and  frog-leg lateral views of both hips exhibit no acute abnormalities.  The soft tissues of the pelvis and hips reveal no abnormalities.  IMPRESSION:  There is no acute bony abnormality of either hip. The joint spaces  are reasonably well maintained for age.        Assessment & Plan:

## 2013-10-24 NOTE — Progress Notes (Signed)
Pre visit review using our clinic review tool, if applicable. No additional management support is needed unless otherwise documented below in the visit note. 

## 2013-10-24 NOTE — Patient Instructions (Signed)
1. Hip pain with walking - question if this is arthritis in the hips vs poor ciruclation. On exam there is some hip pain with movement but not severe. Plan X-rays both hips - if positive for severe arthritis will consider medical therapy and possible referral to orthopedics  If the x-rays do not show advanced arthritis will order a lower extremity arterial doppler study to look at the circulation.  2. Incontinence - you do have some symptoms of enlarged prostate with partial bladder outlet obstruction. This would cause overflow incontinence when you take the lasix. Plan  a trial of Rapaflo - this is a medication that "relaxes" the prostate and will allow for better bladder emptying. This may help the problem.   Intermittent Claudication Blockage of leg arteries results from poor circulation of blood in the leg arteries. This produces an aching, tired, and sometimes burning pain in the legs that is brought on by exercise and made better by rest. Claudication refers to the limping that happens from leg cramps. It is also referred to as Vaso-occlusive disease of the legs, arterial insufficiency of the legs, recurrent leg pain, recurrent leg cramping and calf pain with exercise.  CAUSES  This condition is due to narrowing or blockage of the arteries (muscular vessels which carry blood away from the heart and around the body). Blockage of arteries can occur anywhere in the body. If they occur in the heart, a person may experience angina (chest pain) or even a heart attack. If they occur in the neck or the brain, a person may have a stroke. Intermittent claudication is when the blockage occurs in the legs, most commonly in the calf or the foot.  Atherosclerosis, or blockage of arteries, can occur for many reasons. Some of these are smoking, diabetes, and high cholesterol. SYMPTOMS  Intermittent claudication may occur in both legs, and it often continues to get worse over time. However, some people complain  only of weakness in the legs when walking, or a feeling of "tiredness" in the buttocks. Impotence (not able to have an erection) is an occasional complaint in men. Pain while resting is uncommon.  WHAT TO EXPECT AT [facility] PROVIDER'S OFFICE: Your medical history will be asked for and a physical examination will be performed. Medical history questions documenting claudication in detail may include:   Time pattern  Do you have leg cramps at night (nocturnal cramps)?  How often does leg pain with cramping occur?  Is it getting worse?  What is the quality of the pain?  Is the pain sharp?  Is there an aching pain with the cramps?  Aggravating factors  Is it worse after you exercise?  Is it worse after you are standing for a while?  Do you smoke? How much?  Do you drink alcohol? How much?  Are you diabetic? How well is your blood sugar controlled?  Other  What other symptoms are also present?  Has there been impotence (men)?  Is there pain in the back?  Is there a darkening of the skin of the legs, feet or toes?  Is there weakness or paralysis of the legs? The physical examination may include evaluation of the femoral pulse (in the groin) and the other areas where the pulse can be felt in the legs. DIAGNOSIS  Diagnostic tests that may be performed include:  Blood pressure measured in arms and legs for comparison.  Doppler ultrasonography on the legs and the heart.  Duplex Doppler/ultrasound exam of extremity to  visualize arterial blood flow.  ECG- to evaluate the activity of your heart.  Aortography- to visualize blockages in your arteries. TREATMENT Surgical treatment may be suggested if claudication interferes with the patient's activities or work, and if the diseased arteries do not seem to be improving after treatment. Be aware that this condition can worsen over time and you should carefully monitor your condition. HOME CARE INSTRUCTIONS  Talk to your  caregiver about the cause of your leg cramping and about what to do at home to relieve it.  A healthy diet is important to lessen the likeliness of atherosclerosis.  A program of daily walking for short periods, and stopping for pain or cramping, may help improve function.  It is important to stop smoking.  Avoid putting hot or cold items on legs.  Avoid tight shoes. SEEK MEDICAL CARE IF: There are many other causes of leg pain such as arthritis or low blood potassium. However, some causes of leg pain may be life threatening such as a blood clot in the legs. Seek medical attention if you have:  Leg pain that does not go away.  Legs that may be red, hot or swollen.  Ulcers or sores appear on your ankle or foot.  Any chest pain or shortness of breath accompanying leg pain.  Diabetes.  You are pregnant. SEEK IMMEDIATE MEDICAL CARE IF:   Your leg pain becomes severe or will not go away.  Your foot turns blue or a dark color.  Your leg becomes red, hot or swollen or you develop a fever over 102F.  Any chest pain or shortness of breath accompanying leg pain. MAKE SURE YOU:   Understand these instructions.  Will watch your condition.  Will get help right away if you are not doing well or get worse. Document Released: 07/31/2004 Document Revised: 12/21/2011 Document Reviewed: 05/18/2008 [facility] Patient Information 2014 [place].   Benign Prostatic Hypertrophy  The prostate gland is part of the reproductive system of men. A normal prostate is about the size and shape of a walnut. The prostate gland produces a fluid that is mixed with sperm to make semen. This gland surrounds the urethra and is located in front of the rectum and just below the bladder. The bladder is where urine is stored. The urethra is the tube through which urine passes from the bladder to get out of the body. The prostate grows as a man ages. An enlarged prostate not caused by cancer is called benign  prostatic hypertrophy (BPH). An enlarged prostate can press on the urethra. This can make it harder to pass urine. In the early stages of enlargement, the bladder can get by with a narrowed urethra by forcing the urine through. If the problem gets worse, medical or surgical treatment may be required.  This condition should be followed by your health care provider. The accumulation of urine in the bladder can cause infection. Back pressure and infection can progress to bladder damage and kidney (renal) failure. If needed, your health care provider may refer you to a specialist in kidney and prostate disease (urologist). CAUSES  BPH is a common health problem in men older than 50 years. This condition is a normal part of aging. However, not all men will develop problems from this condition. If the enlargement grows away from the urethra, then there will not be any compression of the urethra and resistance to urine flow.If the growth is toward the urethra and compresses it, you will experience difficulty urinating.  SYMPTOMS   Not able to completely empty your bladder.  Getting up often during the night to urinate.  Need to urinate frequently during the day.  Difficultly starting urine flow.  Decrease in size and strength of your urine stream.  Dribbling after urination.  Pain on urination (more common with infection).  Inability to pass urine. This needs immediate treatment.  The development of a urinary tract infection. DIAGNOSIS  These tests will help your health care provider understand your problem:  A thorough history and physical examination.  A urination history, with the number of times you urinate, the amounts of urine, the strength of the urine stream, and the feeling of emptiness or fullness after urinating.  A postvoid bladder scan that measures any amount of urine that may remain in your bladder after you finish urinating.  Digital rectal exam. In a rectal exam, your  health care provider checks your prostate by putting a gloved, lubricated finger into your rectum to feel the back of your prostate gland. This exam detects the size of your gland and abnormal lumps or growths.  Exam of your urine (urinalysis).  Prostate specific antigen (PSA) screening. This is a blood test used to screen for prostate cancer.  Rectal ultrasonography. This test uses sound waves to electronically produce a picture of your prostate gland. TREATMENT  Once symptoms begin, your health care provider will monitor your condition. Of the men with this condition, one third will have symptoms that stabilize, one third will have symptoms that improve, and one third will have symptoms that progress in the first year. Mild symptoms may not need treatment. Simple observation and yearly exams may be all that is required. Medicines and surgery are options for more severe problems. Your health care provider can help you make an informed decision for what is best. Two classes of medicines are available for relief of prostate symptoms:  Medicines that shrink the prostate. This helps relieve symptoms. These medicines take time to work, and it may be months before any improvement is seen.  Uncommon side effects include problems with sexual function.  Medicines to relax the muscle of the prostate. This also relieves the obstruction by reducing any compression on the urethra.This group of medicines work much faster than those that reduce the size of the prostate gland. Usually, one can experience improvement in days to weeks..  Side effects can include dizziness, fatigue, lightheadedness, and retrograde ejaculation (diminished volume of ejaculate). Several types of surgical treatments are available for relief of prostate symptoms:  Transurethral resection of the prostate (TURP) In this treatment, an instrument is inserted through opening at the tip of the penis. It is used to cut away pieces of the  inner core of the prostate. The pieces are removed through the same opening of the penis. This removes the obstruction and helps get rid of the symptoms.  Transurethral incision (TUIP) In this procedure, small cuts are made in the prostate. This lessens the prostates pressure on the urethra.  Transurethral microwave thermotherapy (TUMT) This procedure uses microwaves to create heat. The heat destroys and removes a small amount of prostate tissue.  Transurethral needle ablation (TUNA) This is a procedure that uses radio frequencies to do the same as TUMT.  Interstitial laser coagulation (ILC) This is a procedure that uses a laser to do the same as TUMT and TUNA.  Transurethral electrovaporization (TUVP) This is a procedure that uses electrodes to do the same as the procedures listed above. [place]  IF:   You develop a fever.  There is unexplained back pain.  Symptoms are not helped by medicines prescribed.  You develop side effects from the medicine you are taking.  Your urine becomes very dark or has a bad smell.  Your lower abdomen becomes distended and you have difficulty passing your urine. SEEK IMMEDIATE MEDICAL CARE IF:   You are suddenly unable to urinate. This is an emergency. You should be seen immediately.  There are large amounts of blood or clots in the urine.  Your urinary problems become unmanageable.  You develop lightheadedness, severe dizziness, or you feel faint.  You develop moderate to severe low back or flank pain.  You develop chills or fever. Document Released: 09/28/2005 Document Revised: 07/19/2013 Document Reviewed: 04/13/2013 [facility] Patient Information 2014 [place], [place].

## 2013-10-25 DIAGNOSIS — I739 Peripheral vascular disease, unspecified: Secondary | ICD-10-CM | POA: Insufficient documentation

## 2013-10-25 DIAGNOSIS — N138 Other obstructive and reflux uropathy: Secondary | ICD-10-CM | POA: Insufficient documentation

## 2013-10-25 DIAGNOSIS — N401 Enlarged prostate with lower urinary tract symptoms: Secondary | ICD-10-CM

## 2013-10-25 NOTE — Assessment & Plan Note (Signed)
Mr. Umholtz c/o bilateral burning hip pain with exertion limited to approximately 100 yards of walking with a quick recovery time. Hip films are negative. He does have ASVD and he is at risk for peripheral vascular disease with claudication. Based on symptoms and negative hip films suspect he has bilateral iliac disease.  Plan LE arterial doppler study  F/u with cardiology if test is positive for further testing and possibly percutaneous intervention.

## 2013-10-25 NOTE — Assessment & Plan Note (Signed)
Followed by Dr. Cruzita Lederer.  Lab Results  Component Value Date   HGBA1C 6.9* 08/01/2013   He is doing well with BID insulin therapy.

## 2013-10-25 NOTE — Assessment & Plan Note (Signed)
Mr. Magos presents c/o uncontrolled incontinence associated with use of diuretics. He does have symptoms of prostatism: slowed stream after slow start, post-void dribble, urinary frequency and overflow incontinence.   Plan Trial of Rapaflo, if this reduces symptoms will prescribe tamsulosin for 12 weeks and simultaneously start finasteride 5 mg daily.

## 2013-10-25 NOTE — Assessment & Plan Note (Signed)
BP Readings from Last 3 Encounters:  10/24/13 122/70  10/20/13 118/62  08/21/13 110/80   Good control on present medication

## 2013-10-27 ENCOUNTER — Telehealth: Payer: Self-pay | Admitting: *Deleted

## 2013-10-27 DIAGNOSIS — N400 Enlarged prostate without lower urinary tract symptoms: Secondary | ICD-10-CM

## 2013-10-27 MED ORDER — TAMSULOSIN HCL 0.4 MG PO CAPS: 0.4000 mg | ORAL_CAPSULE | Freq: Every day | ORAL | Status: DC

## 2013-10-27 NOTE — Telephone Encounter (Signed)
Patient spouse phoned requesting either a script for rapaflo/samples.  PCP, Dr Linda Hedges present and gave VORB Tamsulosin 0.4mg  po daily #30.  Spouse informed, order entered and sent to local [facility].

## 2013-10-30 ENCOUNTER — Other Ambulatory Visit: Payer: Self-pay | Admitting: *Deleted

## 2013-10-30 ENCOUNTER — Other Ambulatory Visit (HOSPITAL_COMMUNITY): Payer: Self-pay | Admitting: Cardiology

## 2013-10-30 ENCOUNTER — Ambulatory Visit (HOSPITAL_COMMUNITY): Payer: Medicare PPO | Attending: Internal Medicine

## 2013-10-30 ENCOUNTER — Telehealth: Payer: Self-pay | Admitting: *Deleted

## 2013-10-30 DIAGNOSIS — N400 Enlarged prostate without lower urinary tract symptoms: Secondary | ICD-10-CM

## 2013-10-30 DIAGNOSIS — Z87891 Personal history of nicotine dependence: Secondary | ICD-10-CM | POA: Insufficient documentation

## 2013-10-30 DIAGNOSIS — I739 Peripheral vascular disease, unspecified: Secondary | ICD-10-CM

## 2013-10-30 DIAGNOSIS — E119 Type 2 diabetes mellitus without complications: Secondary | ICD-10-CM | POA: Insufficient documentation

## 2013-10-30 DIAGNOSIS — I10 Essential (primary) hypertension: Secondary | ICD-10-CM | POA: Insufficient documentation

## 2013-10-30 DIAGNOSIS — I2510 Atherosclerotic heart disease of native coronary artery without angina pectoris: Secondary | ICD-10-CM | POA: Insufficient documentation

## 2013-10-30 DIAGNOSIS — R0989 Other specified symptoms and signs involving the circulatory and respiratory systems: Secondary | ICD-10-CM | POA: Insufficient documentation

## 2013-10-30 DIAGNOSIS — I70219 Atherosclerosis of native arteries of extremities with intermittent claudication, unspecified extremity: Secondary | ICD-10-CM | POA: Insufficient documentation

## 2013-10-30 DIAGNOSIS — R209 Unspecified disturbances of skin sensation: Secondary | ICD-10-CM | POA: Insufficient documentation

## 2013-10-30 DIAGNOSIS — E785 Hyperlipidemia, unspecified: Secondary | ICD-10-CM | POA: Insufficient documentation

## 2013-10-30 MED ORDER — TAMSULOSIN HCL 0.4 MG PO CAPS: 0.4000 mg | ORAL_CAPSULE | Freq: Every day | ORAL | Status: DC

## 2013-10-30 NOTE — Telephone Encounter (Signed)
Spouse phoned stating that insurance company WILL cover newly prescribed flomax (previous order sent to local pharmacy).  Spouse is requesting that 90 day supply be sent to mail order pharmacy.   CB# [phone]

## 2013-10-30 NOTE — Telephone Encounter (Signed)
OK for 90 day prescription 3 refills

## 2013-11-15 ENCOUNTER — Encounter (HOSPITAL_COMMUNITY): Payer: Self-pay | Admitting: *Deleted

## 2013-11-21 ENCOUNTER — Ambulatory Visit: Payer: 59 | Admitting: Pulmonary Disease

## 2013-12-15 ENCOUNTER — Other Ambulatory Visit: Payer: Self-pay | Admitting: *Deleted

## 2013-12-15 ENCOUNTER — Telehealth: Payer: Self-pay | Admitting: *Deleted

## 2013-12-15 MED ORDER — "INSULIN SYRINGE-NEEDLE U-100 31G X 5/16"" 1 ML MISC": Status: DC

## 2013-12-15 MED ORDER — GLUCOSE BLOOD VI STRP: ORAL_STRIP | Status: DC

## 2013-12-15 MED ORDER — INSULIN NPH ISOPHANE & REGULAR (70-30) 100 UNIT/ML ~~LOC~~ SUSP: SUBCUTANEOUS | Status: DC

## 2013-12-20 ENCOUNTER — Other Ambulatory Visit: Payer: Self-pay | Admitting: *Deleted

## 2013-12-20 MED ORDER — TRUEPLUS LANCETS 28G MISC: Status: DC

## 2013-12-20 MED ORDER — GLUCOSE BLOOD VI STRP: ORAL_STRIP | Status: DC

## 2013-12-20 MED ORDER — BLOOD GLUCOSE METER KIT: PACK | Status: DC

## 2013-12-29 ENCOUNTER — Telehealth: Payer: Self-pay | Admitting: Internal Medicine

## 2013-12-29 MED ORDER — ALLOPURINOL 300 MG PO TABS
300.0000 mg | ORAL_TABLET | Freq: Every day | ORAL | Status: DC
Start: 2013-12-29 — End: 2014-02-27

## 2013-12-29 NOTE — Telephone Encounter (Signed)
Pt request new rx for allopurinol to be send into [facility]. Please advise.

## 2014-01-04 ENCOUNTER — Telehealth: Payer: Self-pay | Admitting: Cardiology

## 2014-01-04 NOTE — Telephone Encounter (Signed)
Walk in pt Form " Disability Pla-Card" Dropped off [employer] Back Monday 3.30.15/kdm

## 2014-01-16 ENCOUNTER — Telehealth: Payer: Self-pay

## 2014-01-16 NOTE — Telephone Encounter (Signed)
**Note De-Identified William Hendrix Obfuscation** The pt is advised that Dr Ron Parker has signed his and his wife, William Hendrix's, disability parking placards and that Im mailing them to their address today. I did advise the pt to give letters a week to a week and a half to arrive as our mail is slow, he verbalized understanding.

## 2014-01-18 ENCOUNTER — Telehealth: Payer: Self-pay | Admitting: *Deleted

## 2014-01-18 NOTE — Telephone Encounter (Signed)
Called pt's wife, Fraser Din [phone]) and lvm advising her that we completed the PA for the test strips and we received the response that it was filled under Medicare Part D and it has been approved through Medicare Part B. Advised her that I faxed a copy of the PA response from [facility] to [place] for Mr Brackins's test strips. Advised her to contact [facility] to discuss this with them and be sure that they received the fax. Advised her to contact us back with any questions.

## 2014-01-24 ENCOUNTER — Other Ambulatory Visit: Payer: Self-pay | Admitting: *Deleted

## 2014-01-24 MED ORDER — "PEN NEEDLES 5/16"" 31G X 8 MM MISC": Status: DC

## 2014-01-24 NOTE — Telephone Encounter (Signed)
Pt's wife requested refill for pen needles, 2 x daily be sent in. Done.

## 2014-01-24 NOTE — Telephone Encounter (Signed)
Done

## 2014-01-24 NOTE — Telephone Encounter (Signed)
The reorder for the needles is 2 times daily not 3 times daily will you please fix this for the pt  ALso almost out of the humulin

## 2014-01-29 ENCOUNTER — Telehealth: Payer: Self-pay | Admitting: *Deleted

## 2014-01-29 NOTE — Telephone Encounter (Signed)
Wife called and lvm requesting samples if we have them of Humulin 70/30 pens (or novolin, vial). Return call [phone].

## 2014-02-12 NOTE — Telephone Encounter (Signed)
Called pt's wife and advised her still no samples. And that we will no longer be giving out samples. Understood.

## 2014-02-12 NOTE — Telephone Encounter (Signed)
Humulin 70/30 do we have samples to give to them

## 2014-02-25 ENCOUNTER — Encounter: Payer: Self-pay | Admitting: Cardiology

## 2014-02-27 ENCOUNTER — Ambulatory Visit (INDEPENDENT_AMBULATORY_CARE_PROVIDER_SITE_OTHER): Payer: Commercial Managed Care - HMO | Admitting: Cardiology

## 2014-02-27 ENCOUNTER — Encounter: Payer: Self-pay | Admitting: Cardiology

## 2014-02-27 VITALS — BP 128/68 | HR 80 | Ht 70.5 in | Wt 255.0 lb

## 2014-02-27 DIAGNOSIS — N400 Enlarged prostate without lower urinary tract symptoms: Secondary | ICD-10-CM

## 2014-02-27 DIAGNOSIS — E785 Hyperlipidemia, unspecified: Secondary | ICD-10-CM

## 2014-02-27 DIAGNOSIS — I2510 Atherosclerotic heart disease of native coronary artery without angina pectoris: Secondary | ICD-10-CM

## 2014-02-27 DIAGNOSIS — R002 Palpitations: Secondary | ICD-10-CM

## 2014-02-27 MED ORDER — ISOSORBIDE MONONITRATE ER 30 MG PO TB24: 30.0000 mg | ORAL_TABLET | Freq: Every day | ORAL | Status: DC

## 2014-02-27 MED ORDER — ATORVASTATIN CALCIUM 20 MG PO TABS: 20.0000 mg | ORAL_TABLET | Freq: Every day | ORAL | Status: DC

## 2014-02-27 MED ORDER — FUROSEMIDE 80 MG PO TABS: 80.0000 mg | ORAL_TABLET | Freq: Every day | ORAL | Status: DC

## 2014-02-27 MED ORDER — CLOPIDOGREL BISULFATE 75 MG PO TABS: 75.0000 mg | ORAL_TABLET | Freq: Every day | ORAL | Status: DC

## 2014-02-27 MED ORDER — ALLOPURINOL 300 MG PO TABS: 300.0000 mg | ORAL_TABLET | Freq: Every day | ORAL | Status: DC

## 2014-02-27 MED ORDER — FENOFIBRATE 160 MG PO TABS: 160.0000 mg | ORAL_TABLET | Freq: Every day | ORAL | Status: DC

## 2014-02-27 MED ORDER — METOPROLOL TARTRATE 50 MG PO TABS: 50.0000 mg | ORAL_TABLET | Freq: Two times a day (BID) | ORAL | Status: DC

## 2014-02-27 MED ORDER — TAMSULOSIN HCL 0.4 MG PO CAPS: 0.4000 mg | ORAL_CAPSULE | Freq: Every day | ORAL | Status: DC

## 2014-02-27 NOTE — Assessment & Plan Note (Signed)
At the time of the last visit I was able to start a small dose of a statin. His liver functions are stable. We will increase that dose.

## 2014-02-27 NOTE — Progress Notes (Signed)
Patient ID: William Hendrix, male   DOB: [birth year], 73 y.o.   MRN: [id number]    HPI  Patient is seen today to followup coronary disease. I saw him last in November, 2014. He has had exertional shortness of breath and chest tightness. He underwent coronary intervention July 16, 2013. He did very well with this. Several weeks ago the patient had been doing excess work at his [place]. 1 day he felt poorly. His sugar was mildly decreased. He also felt palpitations. He did not have syncope or presyncope. He had something to eat and rested. He felt quite fatigued that day but the next day he felt better and he has not had recurring problems.  Allergies  Allergen Reactions  . Levemir [Insulin Detemir] Swelling    Patient had redness and swelling and tenderness at injection site.  . Codeine Other (See Comments)    GI UPSET & TREMORS  . Lipitor [Atorvastatin]   . Novolog Mix [Insulin Aspart Prot & Aspart]     Causes skin to swell/itch at injection site    Current Outpatient Prescriptions  Medication Sig Dispense Refill  . acetaminophen (TYLENOL) 500 MG tablet Take 1,000 mg by mouth 2 (two) times daily.      Marland Kitchen allopurinol (ZYLOPRIM) 300 MG tablet Take 1 tablet (300 mg total) by mouth daily.  90 tablet  3  . aspirin EC 81 MG tablet Take 81 mg by mouth daily.      Marland Kitchen atorvastatin (LIPITOR) 10 MG tablet Take 1 tablet (10 mg total) by mouth daily.  90 tablet  1  . Blood Glucose Monitoring Suppl (BLOOD GLUCOSE METER) kit Use as instructed  1 each  0  . Cholecalciferol (VITAMIN D) 1000 UNITS capsule Take 1,000 Units by mouth at bedtime.       . clopidogrel (PLAVIX) 75 MG tablet Take 1 tablet (75 mg total) by mouth daily with breakfast.  90 tablet  1  . fenofibrate 160 MG tablet Take 1 tablet (160 mg total) by mouth daily.  90 tablet  1  . fluocinonide cream (LIDEX) 5.00 % Apply 1 application topically daily as needed. FOR RASH      . furosemide (LASIX) 80 MG tablet Take 1 tablet (80 mg total) by  mouth daily.  90 tablet  1  . glucose blood test strip Test blood sugar 4 times daily as instructed. Dx code: 250.92  400 each  2  . Insulin Isophane & Regular (HUMULIN 70/30 KWIKPEN) (70-30) 100 UNIT/ML SUPN Inject 30-35 Units into the skin 2 (two) times daily. Inject 35 units in the morning and 30 units in the evening.  10 pen  3  . insulin NPH-regular Human (NOVOLIN 70/30) (70-30) 100 UNIT/ML injection Inject 40 units before breakfast, 15 units before lunch and 30 units before dinner.  80 mL  1  . Insulin Pen Needle (PEN NEEDLES 31GX5/16") 31G X 8 MM MISC Use to inject insulin 2 times daily as instructed.  200 each  2  . Insulin Syringe-Needle U-100 (BD INSULIN SYRINGE ULTRAFINE) 31G X 5/16" 1 ML MISC Inject 2 times daily as instructed by physician.  100 each  prn  . isosorbide mononitrate (IMDUR) 30 MG 24 hr tablet Take 1 tablet (30 mg total) by mouth daily.  90 tablet  1  . metoprolol (LOPRESSOR) 50 MG tablet Take 1 tablet (50 mg total) by mouth 2 (two) times daily.  180 tablet  1  . nitroGLYCERIN (NITROSTAT) 0.4 MG SL tablet Place  1 tablet (0.4 mg total) under the tongue every 5 (five) minutes as needed for chest pain.  25 tablet  12  . pantoprazole (PROTONIX) 20 MG tablet Take 20 mg by mouth daily.      . tamsulosin (FLOMAX) 0.4 MG CAPS capsule Take 1 capsule (0.4 mg total) by mouth daily.  90 capsule  3  . TRUEPLUS LANCETS 28G MISC Use to test blood glucose 4 times daily as instructed. Dx code: 250.92  400 each  2   No current facility-administered medications for this visit.    History   Social History  . Marital Status: Married    Spouse Name: N/A    Number of Children: N/A  . Years of Education: N/A   Occupational History  . [employer]    Social History Main Topics  . Smoking status: Former Smoker -- 3.00 packs/day for 30 years    Types: Cigarettes    Quit date: 09/17/1996  . Smokeless tobacco: Never Used     Comment: occ drink  . Alcohol Use: No  . Drug Use: No  . Sexual  Activity: Not Currently    Birth Control/ Protection: None   Other Topics Concern  . Not on file   Social History Narrative   Married and lives locally with his wife.  [employer].    Family History  Problem Relation Age of Onset  . Prostate cancer    . Kidney cancer    . Cancer      Bladder cancer  . Coronary artery disease      Past Medical History  Diagnosis Date  . Obesity   . Edema   . DM (diabetes mellitus)   . CKD (chronic kidney disease)      CKD stage III.. creatinine up to 1.5 in hospital after dye in October, 2011  . Fatigue   . Nephrolithiasis   . Gout   . Skin cancer   . CAD (coronary artery disease)     a. Stent 2000. b. Several caths since then with nonobst dz. c. 05/2013: IVUS-guided PTCA/DES to proximal-to-mid LAD, residual PDA disease for med rx unless recurrent angina. EF 55-60%.  . Ventral hernia   . Sinusitis   . Back problem   . Hyperlipidemia   . HTN (hypertension)   . Ejection fraction     55%, 07/2010, mild inferior hypo  . SOB (shortness of breath)   . Dizziness     Mild positional dizziness May, 2012  . History of pneumonia   . Melanoma     Melanoma removed from the left forearm with wide excision May, 2012  . Pneumonia     hx x4 young  . Sleep apnea     CPAP,  [name] since 2000  . GERD (gastroesophageal reflux disease)   . H/O hiatal hernia   . Arthritis   . Spinal stenosis     a. s/p surgical repair 2013  . Positive D-dimer     a. significant elevation ,hospital 07/2010, etiology unclear. b. D Dimer chronically > 20.    Past Surgical History  Procedure Laterality Date  . Nose surgery    . Cardiac catheterization    . Coronary stent placement  2000    CAD  . Knee arthroscopy  90's    rt  . Cervical disc surgery  90's  . Lumbar laminectomy/decompression microdiscectomy  08/30/2012    Procedure: LUMBAR LAMINECTOMY/DECOMPRESSION MICRODISCECTOMY 2 LEVELS;  Surgeon: [name], MD;  Location: [facility];  Service:  Neurosurgery;  Laterality: Bilateral;  Bilateral Lumbar three-four Lumbar four-five Laminotomies  . Back surgery    . Coronary angioplasty with stent placement  07/26/2013    DES to RCA extending to PDA      Patient Active Problem List   Diagnosis Date Noted  . Dizziness     Priority: High  . CAD (coronary artery disease)     Priority: High  . Ejection fraction     Priority: High  . SOB (shortness of breath)     Priority: High  . Claudication of lower extremity 10/25/2013  . BPH with obstruction/lower urinary tract symptoms 10/25/2013  . Hypertriglyceridemia 08/15/2012  . Spinal stenosis of lumbar region at multiple levels 08/10/2012  . Melanoma   . Renal insufficiency   . Hyperlipidemia   . HTN (hypertension)   . OSA (obstructive sleep apnea)   . PARESTHESIA 05/30/2009  . OVERWEIGHT/OBESITY 01/22/2009  . EDEMA 01/22/2009  . Type II or unspecified type diabetes mellitus with unspecified complication, uncontrolled 04/25/2008  . OTHER ABNORMAL BLOOD CHEMISTRY 04/25/2008  . GOUT 10/21/2007  . DEPRESSION 10/21/2007  . VENTRAL HERNIA 10/21/2007  . HIATAL HERNIA 10/21/2007  . FATIGUE 10/21/2007  . SKIN CANCER, HX OF 10/21/2007  . NEPHROLITHIASIS, HX OF 10/21/2007    ROS   Patient denies fever, chills, headache, sweats, rash, change in vision, change in hearing, chest pain, cough, nausea vomiting, urinary symptoms. All other systems are reviewed and are negative.  PHYSICAL EXAM  Patient is overweight. He is stable. He's here with his wife. He is oriented to person time and place. Affect is normal. Head is atraumatic. Sclera and conjunctiva are normal. There is no jugulovenous distention. Lungs are clear. Respiratory effort is nonlabored. Cardiac exam reveals S1 and S2. Abdomen is protuberant but soft. There is no peripheral edema. There no musculoskeletal deformities. There are no skin rashes.  Filed Vitals:   02/27/14 1434  BP: 128/68  Pulse: 80  Height: 5' 10.5" (1.791 m)    Weight: 255 lb (115.667 kg)     ASSESSMENT & PLAN

## 2014-02-27 NOTE — Assessment & Plan Note (Signed)
Coronary disease is stable. I think that the symptoms that he had recently were not from ischemic disease. No further workup.

## 2014-02-27 NOTE — Assessment & Plan Note (Signed)
The patient had an episode recently of feeling poorly and had some palpitations. This may have all been related to some excess physical activity compared to his baseline. He has not had any significant recurrence. I do feel that we need to monitor him for 48 hours to be sure that there are not any significant unusual arrhythmias.

## 2014-02-27 NOTE — Patient Instructions (Addendum)
Your physician has recommended you make the following change in your medication: increase Atorvastatin to 20 mg at bedtime  Your physician has recommended that you wear a 48 hour holter monitor. Holter monitors are medical devices that record the heart's electrical activity. Doctors most often use these monitors to diagnose arrhythmias. Arrhythmias are problems with the speed or rhythm of the heartbeat. The monitor is a small, portable device. You can wear one while you do your normal daily activities. This is usually used to diagnose what is causing palpitations/syncope (passing out).  Your physician recommends that you return for lab work on:6/29 The lab is open from 7:30 until 5: pm. Please do not eat or drink after midnight the night before labs are drawn.  Your physician recommends that you schedule a follow-up appointment in: 3 to 4 months

## 2014-03-07 ENCOUNTER — Telehealth: Payer: Self-pay | Admitting: *Deleted

## 2014-03-07 ENCOUNTER — Encounter: Payer: Self-pay | Admitting: *Deleted

## 2014-03-07 ENCOUNTER — Encounter (INDEPENDENT_AMBULATORY_CARE_PROVIDER_SITE_OTHER): Payer: Commercial Managed Care - HMO

## 2014-03-07 DIAGNOSIS — R002 Palpitations: Secondary | ICD-10-CM

## 2014-03-07 NOTE — Telephone Encounter (Signed)
Needs RX for insulin syringes [facility] it up again per wife words, 3 shots a day,

## 2014-03-07 NOTE — Progress Notes (Signed)
Patient ID: William Hendrix, male   DOB: Jun 25, 1941, 73 y.o.   MRN: [id number] EVO 48 hour holter monitor applied to patient.

## 2014-03-08 ENCOUNTER — Emergency Department (HOSPITAL_BASED_OUTPATIENT_CLINIC_OR_DEPARTMENT_OTHER): Admission: EM | Admit: 2014-03-08 | Payer: No Typology Code available for payment source | Source: Home / Self Care

## 2014-03-08 ENCOUNTER — Emergency Department (HOSPITAL_BASED_OUTPATIENT_CLINIC_OR_DEPARTMENT_OTHER): Payer: No Typology Code available for payment source

## 2014-03-08 ENCOUNTER — Encounter (HOSPITAL_BASED_OUTPATIENT_CLINIC_OR_DEPARTMENT_OTHER): Payer: Self-pay | Admitting: Emergency Medicine

## 2014-03-08 ENCOUNTER — Emergency Department (HOSPITAL_BASED_OUTPATIENT_CLINIC_OR_DEPARTMENT_OTHER)
Admission: EM | Admit: 2014-03-08 | Discharge: 2014-03-08 | Disposition: A | Discharge status: Home / Self Care | Payer: No Typology Code available for payment source | Attending: Emergency Medicine | Admitting: Emergency Medicine

## 2014-03-08 ENCOUNTER — Other Ambulatory Visit: Payer: Self-pay | Admitting: *Deleted

## 2014-03-08 DIAGNOSIS — Z9861 Coronary angioplasty status: Secondary | ICD-10-CM | POA: Insufficient documentation

## 2014-03-08 DIAGNOSIS — IMO0002 Reserved for concepts with insufficient information to code with codable children: Secondary | ICD-10-CM | POA: Insufficient documentation

## 2014-03-08 DIAGNOSIS — Y92410 Unspecified street and highway as the place of occurrence of the external cause: Secondary | ICD-10-CM | POA: Insufficient documentation

## 2014-03-08 DIAGNOSIS — Y9389 Activity, other specified: Secondary | ICD-10-CM | POA: Insufficient documentation

## 2014-03-08 DIAGNOSIS — Z85820 Personal history of malignant melanoma of skin: Secondary | ICD-10-CM | POA: Insufficient documentation

## 2014-03-08 DIAGNOSIS — K219 Gastro-esophageal reflux disease without esophagitis: Secondary | ICD-10-CM | POA: Insufficient documentation

## 2014-03-08 DIAGNOSIS — I2510 Atherosclerotic heart disease of native coronary artery without angina pectoris: Secondary | ICD-10-CM | POA: Insufficient documentation

## 2014-03-08 DIAGNOSIS — N183 Chronic kidney disease, stage 3 unspecified: Secondary | ICD-10-CM | POA: Insufficient documentation

## 2014-03-08 DIAGNOSIS — Z8701 Personal history of pneumonia (recurrent): Secondary | ICD-10-CM | POA: Insufficient documentation

## 2014-03-08 DIAGNOSIS — Z7982 Long term (current) use of aspirin: Secondary | ICD-10-CM | POA: Insufficient documentation

## 2014-03-08 DIAGNOSIS — R52 Pain, unspecified: Secondary | ICD-10-CM | POA: Insufficient documentation

## 2014-03-08 DIAGNOSIS — G4733 Obstructive sleep apnea (adult) (pediatric): Secondary | ICD-10-CM | POA: Insufficient documentation

## 2014-03-08 DIAGNOSIS — M546 Pain in thoracic spine: Secondary | ICD-10-CM

## 2014-03-08 DIAGNOSIS — E785 Hyperlipidemia, unspecified: Secondary | ICD-10-CM | POA: Insufficient documentation

## 2014-03-08 DIAGNOSIS — Z794 Long term (current) use of insulin: Secondary | ICD-10-CM | POA: Insufficient documentation

## 2014-03-08 DIAGNOSIS — Z9889 Other specified postprocedural states: Secondary | ICD-10-CM | POA: Insufficient documentation

## 2014-03-08 DIAGNOSIS — Z87891 Personal history of nicotine dependence: Secondary | ICD-10-CM | POA: Insufficient documentation

## 2014-03-08 DIAGNOSIS — R739 Hyperglycemia, unspecified: Secondary | ICD-10-CM

## 2014-03-08 DIAGNOSIS — I129 Hypertensive chronic kidney disease with stage 1 through stage 4 chronic kidney disease, or unspecified chronic kidney disease: Secondary | ICD-10-CM | POA: Insufficient documentation

## 2014-03-08 DIAGNOSIS — E669 Obesity, unspecified: Secondary | ICD-10-CM | POA: Insufficient documentation

## 2014-03-08 DIAGNOSIS — Z7901 Long term (current) use of anticoagulants: Secondary | ICD-10-CM | POA: Insufficient documentation

## 2014-03-08 DIAGNOSIS — E119 Type 2 diabetes mellitus without complications: Secondary | ICD-10-CM | POA: Insufficient documentation

## 2014-03-08 DIAGNOSIS — Z85828 Personal history of other malignant neoplasm of skin: Secondary | ICD-10-CM | POA: Insufficient documentation

## 2014-03-08 DIAGNOSIS — M109 Gout, unspecified: Secondary | ICD-10-CM | POA: Insufficient documentation

## 2014-03-08 LAB — CBG MONITORING, ED: Glucose-Capillary: 223 mg/dL — ABNORMAL HIGH (ref 70–99)

## 2014-03-08 MED ORDER — ACETAMINOPHEN 325 MG PO TABS
650.0000 mg | ORAL_TABLET | Freq: Once | ORAL | Status: AC
  Administered 2014-03-08: 650 mg via ORAL
  Filled 2014-03-08: qty 2

## 2014-03-08 MED ORDER — "INSULIN SYRINGE-NEEDLE U-100 31G X 5/16"" 1 ML MISC": Status: DC

## 2014-03-08 NOTE — ED Notes (Signed)
Pt was discharged in error. See previous chart from today's visit for triage information and testing.

## 2014-03-08 NOTE — Discharge Instructions (Signed)
If you were given medicines take as directed.  If you are on coumadin or contraceptives realize their levels and effectiveness is altered by many different medicines.  If you have any reaction (rash, tongues swelling, other) to the medicines stop taking and see a physician.   Please follow up as directed and return to the ER or see a physician for new or worsening symptoms.  Thank you. Filed Vitals:   03/08/14 0825  BP: 128/59  Pulse: 62  Temp: 98.2 F (36.8 C)  TempSrc: Oral  Resp: 16  SpO2: 98%

## 2014-03-08 NOTE — Discharge Instructions (Signed)
If you were given medicines take as directed.  If you are on coumadin or contraceptives realize their levels and effectiveness is altered by many different medicines.  If you have any reaction (rash, tongues swelling, other) to the medicines stop taking and see a physician.   °Please follow up as directed and return to the ER or see a physician for new or worsening symptoms.  Thank you. ° ° °
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
## 2014-03-16 ENCOUNTER — Telehealth: Payer: Self-pay | Admitting: Cardiology
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
## 2014-03-16 NOTE — Telephone Encounter (Signed)
New message     Want monitor results 

## 2014-03-19 NOTE — Telephone Encounter (Signed)
I have received the pts holter report and will give to Dr Ron Parker for his review on 03/21/14.

## 2014-03-21 NOTE — Telephone Encounter (Signed)
**Note De-Identified Kaleea Penner Obfuscation** The pts wife, Mardene Celeste, is advised. She verbalized understanding and states that she will notify the pt.
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
## 2014-04-09 ENCOUNTER — Other Ambulatory Visit (INDEPENDENT_AMBULATORY_CARE_PROVIDER_SITE_OTHER): Payer: Commercial Managed Care - HMO

## 2014-04-09 DIAGNOSIS — E785 Hyperlipidemia, unspecified: Secondary | ICD-10-CM

## 2014-04-09 LAB — HEPATIC FUNCTION PANEL
ALK PHOS: 51 U/L (ref 39–117)
ALT: 28 U/L (ref 0–53)
AST: 26 U/L (ref 0–37)
Albumin: 4.3 g/dL (ref 3.5–5.2)
Bilirubin, Direct: 0.1 mg/dL (ref 0.0–0.3)
TOTAL PROTEIN: 7.6 g/dL (ref 6.0–8.3)
Total Bilirubin: 0.8 mg/dL (ref 0.2–1.2)

## 2014-04-09 LAB — LIPID PANEL
CHOL/HDL RATIO: 5
Cholesterol: 122 mg/dL (ref 0–200)
HDL: 24.9 mg/dL — ABNORMAL LOW (ref >39.00)
LDL Cholesterol: 31 mg/dL (ref 0–99)
NonHDL: 97.1
TRIGLYCERIDES: 331 mg/dL — AB (ref 0.0–149.0)
VLDL: 66.2 mg/dL — ABNORMAL HIGH (ref 0.0–40.0)

## 2014-05-16 ENCOUNTER — Other Ambulatory Visit: Payer: Self-pay | Admitting: Cardiology

## 2014-07-05 ENCOUNTER — Ambulatory Visit (INDEPENDENT_AMBULATORY_CARE_PROVIDER_SITE_OTHER): Payer: Commercial Managed Care - HMO | Admitting: Cardiology

## 2014-07-05 ENCOUNTER — Encounter: Payer: Self-pay | Admitting: Cardiology

## 2014-07-05 VITALS — BP 118/68 | HR 74 | Ht 70.5 in | Wt 256.0 lb

## 2014-07-05 DIAGNOSIS — M25559 Pain in unspecified hip: Secondary | ICD-10-CM

## 2014-07-05 DIAGNOSIS — M25552 Pain in left hip: Secondary | ICD-10-CM

## 2014-07-05 DIAGNOSIS — R0602 Shortness of breath: Secondary | ICD-10-CM

## 2014-07-05 DIAGNOSIS — M25551 Pain in right hip: Secondary | ICD-10-CM | POA: Insufficient documentation

## 2014-07-05 DIAGNOSIS — I2510 Atherosclerotic heart disease of native coronary artery without angina pectoris: Secondary | ICD-10-CM

## 2014-07-05 DIAGNOSIS — E785 Hyperlipidemia, unspecified: Secondary | ICD-10-CM

## 2014-07-05 MED ORDER — METOPROLOL TARTRATE 50 MG PO TABS: 50.0000 mg | ORAL_TABLET | Freq: Two times a day (BID) | ORAL | Status: DC

## 2014-07-05 MED ORDER — FENOFIBRATE 160 MG PO TABS: 160.0000 mg | ORAL_TABLET | Freq: Every day | ORAL | Status: DC

## 2014-07-05 MED ORDER — CLOPIDOGREL BISULFATE 75 MG PO TABS: 75.0000 mg | ORAL_TABLET | Freq: Every day | ORAL | Status: DC

## 2014-07-05 MED ORDER — FUROSEMIDE 80 MG PO TABS: 80.0000 mg | ORAL_TABLET | Freq: Every day | ORAL | Status: DC

## 2014-07-05 MED ORDER — ATORVASTATIN CALCIUM 20 MG PO TABS: 20.0000 mg | ORAL_TABLET | Freq: Every day | ORAL | Status: DC

## 2014-07-05 MED ORDER — NITROGLYCERIN 0.4 MG SL SUBL: 0.4000 mg | SUBLINGUAL_TABLET | SUBLINGUAL | Status: DC | PRN

## 2014-07-05 MED ORDER — ISOSORBIDE MONONITRATE ER 30 MG PO TB24: 30.0000 mg | ORAL_TABLET | Freq: Every day | ORAL | Status: DC

## 2014-07-05 NOTE — Assessment & Plan Note (Signed)
He is now tolerating a modest dose of statin. No change in therapy.

## 2014-07-05 NOTE — Assessment & Plan Note (Signed)
Coronary disease is stable. I will keep him on dual antiplatelet therapy for now.

## 2014-07-05 NOTE — Assessment & Plan Note (Signed)
He has mild shortness of breath. No change in therapy.

## 2014-07-05 NOTE — Assessment & Plan Note (Signed)
Etiology of his hip pain is not clear. He had the pain before we restarted his statin. I decided not to stop his statin at this time.
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
## 2014-07-05 NOTE — Patient Instructions (Signed)
**Note De-identified Will Heinkel Obfuscation** Your physician recommends that you continue on your current medications as directed. Please refer to the Current Medication list given to you today.  Your physician wants you to follow-up in: 6 months. You will receive a reminder letter in the mail two months in advance. If you don't receive a letter, please call our office to schedule the follow-up appointment.  

## 2014-08-02 ENCOUNTER — Encounter: Payer: Self-pay | Admitting: Family Medicine

## 2014-08-02 ENCOUNTER — Ambulatory Visit (INDEPENDENT_AMBULATORY_CARE_PROVIDER_SITE_OTHER): Payer: Commercial Managed Care - HMO | Admitting: Family Medicine

## 2014-08-02 VITALS — BP 113/62 | HR 66 | Temp 98.3°F | Ht 70.5 in | Wt 255.4 lb

## 2014-08-02 DIAGNOSIS — G4733 Obstructive sleep apnea (adult) (pediatric): Secondary | ICD-10-CM

## 2014-08-02 DIAGNOSIS — I10 Essential (primary) hypertension: Secondary | ICD-10-CM

## 2014-08-02 DIAGNOSIS — N138 Other obstructive and reflux uropathy: Secondary | ICD-10-CM

## 2014-08-02 DIAGNOSIS — E669 Obesity, unspecified: Secondary | ICD-10-CM

## 2014-08-02 DIAGNOSIS — C439 Malignant melanoma of skin, unspecified: Secondary | ICD-10-CM

## 2014-08-02 DIAGNOSIS — E119 Type 2 diabetes mellitus without complications: Secondary | ICD-10-CM

## 2014-08-02 DIAGNOSIS — R32 Unspecified urinary incontinence: Secondary | ICD-10-CM

## 2014-08-02 DIAGNOSIS — E1169 Type 2 diabetes mellitus with other specified complication: Secondary | ICD-10-CM

## 2014-08-02 DIAGNOSIS — E785 Hyperlipidemia, unspecified: Secondary | ICD-10-CM

## 2014-08-02 DIAGNOSIS — N289 Disorder of kidney and ureter, unspecified: Secondary | ICD-10-CM

## 2014-08-02 DIAGNOSIS — M109 Gout, unspecified: Secondary | ICD-10-CM

## 2014-08-02 DIAGNOSIS — Z23 Encounter for immunization: Secondary | ICD-10-CM

## 2014-08-02 DIAGNOSIS — N401 Enlarged prostate with lower urinary tract symptoms: Secondary | ICD-10-CM

## 2014-08-02 LAB — URINALYSIS
BILIRUBIN URINE: NEGATIVE
HGB URINE DIPSTICK: NEGATIVE
Ketones, ur: NEGATIVE
LEUKOCYTES UA: NEGATIVE
NITRITE: NEGATIVE
Specific Gravity, Urine: 1.01 (ref 1.000–1.030)
TOTAL PROTEIN, URINE-UPE24: NEGATIVE
UROBILINOGEN UA: 0.2 (ref 0.0–1.0)
Urine Glucose: NEGATIVE
pH: 6 (ref 5.0–8.0)

## 2014-08-02 LAB — CBC
HCT: 41 % (ref 39.0–52.0)
Hemoglobin: 13.4 g/dL (ref 13.0–17.0)
MCHC: 32.8 g/dL (ref 30.0–36.0)
MCV: 93.3 fl (ref 78.0–100.0)
PLATELETS: 220 10*3/uL (ref 150.0–400.0)
RBC: 4.39 Mil/uL (ref 4.22–5.81)
RDW: 13.2 % (ref 11.5–15.5)
WBC: 6 10*3/uL (ref 4.0–10.5)

## 2014-08-02 LAB — HEMOGLOBIN A1C: HEMOGLOBIN A1C: 9.5 % — AB (ref 4.6–6.5)

## 2014-08-02 NOTE — Telephone Encounter (Signed)
error 

## 2014-08-02 NOTE — Patient Instructions (Signed)

## 2014-08-02 NOTE — Progress Notes (Signed)
Pre visit review using our clinic review tool, if applicable. No additional management support is needed unless otherwise documented below in the visit note. 

## 2014-08-03 LAB — TSH: TSH: 2.28 u[IU]/mL (ref 0.35–4.50)

## 2014-08-03 LAB — MICROALBUMIN / CREATININE URINE RATIO
Creatinine,U: 13.9 mg/dL
Microalb Creat Ratio: 5.1 mg/g (ref 0.0–30.0)
Microalb, Ur: 0.7 mg/dL (ref 0.0–1.9)

## 2014-08-03 LAB — RENAL FUNCTION PANEL
ALBUMIN: 3.4 g/dL — AB (ref 3.5–5.2)
BUN: 27 mg/dL — AB (ref 6–23)
CALCIUM: 9.8 mg/dL (ref 8.4–10.5)
CHLORIDE: 101 meq/L (ref 96–112)
CO2: 26 mEq/L (ref 19–32)
Creatinine, Ser: 1.4 mg/dL (ref 0.4–1.5)
GFR: 54.39 mL/min — ABNORMAL LOW (ref >60.00)
Glucose, Bld: 288 mg/dL — ABNORMAL HIGH (ref 70–99)
PHOSPHORUS: 1.9 mg/dL — AB (ref 2.3–4.6)
POTASSIUM: 3.7 meq/L (ref 3.5–5.1)
Sodium: 137 mEq/L (ref 135–145)

## 2014-08-03 LAB — HEPATIC FUNCTION PANEL
ALK PHOS: 55 U/L (ref 39–117)
ALT: 23 U/L (ref 0–53)
AST: 21 U/L (ref 0–37)
Albumin: 3.4 g/dL — ABNORMAL LOW (ref 3.5–5.2)
Bilirubin, Direct: 0 mg/dL (ref 0.0–0.3)
TOTAL PROTEIN: 7.4 g/dL (ref 6.0–8.3)
Total Bilirubin: 0.8 mg/dL (ref 0.2–1.2)

## 2014-08-03 LAB — LIPID PANEL
CHOL/HDL RATIO: 6
Cholesterol: 121 mg/dL (ref 0–200)
HDL: 21.1 mg/dL — ABNORMAL LOW (ref >39.00)
NonHDL: 99.9
Triglycerides: 330 mg/dL — ABNORMAL HIGH (ref 0.0–149.0)
VLDL: 66 mg/dL — AB (ref 0.0–40.0)

## 2014-08-03 LAB — URINE CULTURE
COLONY COUNT: NO GROWTH
Organism ID, Bacteria: NO GROWTH

## 2014-08-03 LAB — URIC ACID: Uric Acid, Serum: 4 mg/dL (ref 4.0–7.8)

## 2014-08-03 LAB — LDL CHOLESTEROL, DIRECT: LDL DIRECT: 49.7 mg/dL

## 2014-08-05 NOTE — Assessment & Plan Note (Signed)
Well controlled, no changes to meds. Encouraged heart healthy diet such as the DASH diet and exercise as tolerated.  °

## 2014-08-05 NOTE — Assessment & Plan Note (Signed)
Worsening numbers increase insulin as discussed and minimize simple carbs

## 2014-08-05 NOTE — Assessment & Plan Note (Signed)
Frequency noted, urine culture negative. Referred to urology

## 2014-08-05 NOTE — Assessment & Plan Note (Signed)
Normal uric acid

## 2014-08-05 NOTE — Progress Notes (Signed)
Patient ID: William Hendrix, male   DOB: June 20, 1941, 73 y.o.   MRN: [id number] ATARI NOVICK [id number] Dec 15, 1940 08/05/2014      Progress Note-Follow Up  Subjective  Chief Complaint  Chief Complaint  Patient presents with  . Establish Care    new patient  . Injections    flu and prevnar    HPI  Patient is a 73 year old male in today for routine medical care. In today accompanied by his wife to establish care, his previous PMD has retired. Had a bad gastroenteritis a few months ago but is improved at this time. Has ongoing struggles with neck and back pain, has had surgery on his neck in the past with Dr Ellene Route. Uses his CPAP nightly. Denies CP/palp/SOB/HA/congestion/fevers/GI or GU c/o. Taking meds as prescribed  Past Medical History  Diagnosis Date  . Obesity   . Edema   . DM (diabetes mellitus)   . Fatigue   . Gout   . Skin cancer   . CAD (coronary artery disease)     a. Stent 2000. b. Several caths since then with nonobst dz. c. 05/2013: IVUS-guided PTCA/DES to proximal-to-mid LAD, residual PDA disease for med rx unless recurrent angina. EF 55-60%.  . Ventral hernia   . Sinusitis   . Back problem   . Hyperlipidemia   . HTN (hypertension)   . Ejection fraction     55%, 07/2010, mild inferior hypo  . SOB (shortness of breath)   . Dizziness     Mild positional dizziness May, 2012  . History of pneumonia   . Melanoma     Melanoma removed from the left forearm with wide excision May, 2012  . Pneumonia     hx x4 young  . Sleep apnea     CPAP,  [name] since 2000  . GERD (gastroesophageal reflux disease)   . H/O hiatal hernia   . Arthritis   . Spinal stenosis     a. s/p surgical repair 2013  . Positive D-dimer     a. significant elevation ,hospital 07/2010, etiology unclear. b. D Dimer chronically > 20.  Marland Kitchen CKD (chronic kidney disease)      CKD stage III.. creatinine up to 1.5 in hospital after dye in October, 2011  . Nephrolithiasis     Past Surgical History   Procedure Laterality Date  . Nose surgery    . Cardiac catheterization    . Coronary stent placement  2000    CAD  . Knee arthroscopy  90's    rt  . Cervical disc surgery  90's  . Lumbar laminectomy/decompression microdiscectomy  08/30/2012    Procedure: LUMBAR LAMINECTOMY/DECOMPRESSION MICRODISCECTOMY 2 LEVELS;  Surgeon: [name], MD;  Location: [facility];  Service: Neurosurgery;  Laterality: Bilateral;  Bilateral Lumbar three-four Lumbar four-five Laminotomies  . Back surgery    . Coronary angioplasty with stent placement  07/26/2013    DES to RCA extending to PDA      Family History  Problem Relation Age of Onset  . Prostate cancer    . Kidney cancer    . Cancer      Bladder cancer  . Coronary artery disease    . Cancer Mother     intestinal   . Heart disease Mother   . Cancer Father     prostate  . Migraines Daughter   . Leukemia Sister   . Heart disease Sister     History   Social History  .  Marital Status: Married    Spouse Name: N/A    Number of Children: N/A  . Years of Education: N/A   Occupational History  . [employer]    Social History Main Topics  . Smoking status: Former Smoker -- 3.00 packs/day for 30 years    Types: Cigarettes    Quit date: 09/17/1996  . Smokeless tobacco: Never Used  . Alcohol Use: No  . Drug Use: No  . Sexual Activity: Not Currently    Birth Control/ Protection: None   Other Topics Concern  . Not on file   Social History Narrative   Married and lives locally with his wife.  [employer].    Current Outpatient Prescriptions on File Prior to Visit  Medication Sig Dispense Refill  . acetaminophen (TYLENOL) 500 MG tablet Take 1,000 mg by mouth 2 (two) times daily.      Marland Kitchen allopurinol (ZYLOPRIM) 300 MG tablet Take 1 tablet (300 mg total) by mouth daily.  90 tablet  3  . aspirin EC 81 MG tablet Take 81 mg by mouth daily.      Marland Kitchen atorvastatin (LIPITOR) 20 MG tablet Take 1 tablet (20 mg total) by mouth daily.  90 tablet  3  .  Blood Glucose Monitoring Suppl (BLOOD GLUCOSE METER) kit Use as instructed  1 each  0  . Cholecalciferol (VITAMIN D) 1000 UNITS capsule Take 1,000 Units by mouth at bedtime.       . clopidogrel (PLAVIX) 75 MG tablet Take 1 tablet (75 mg total) by mouth daily with breakfast.  90 tablet  3  . fenofibrate 160 MG tablet Take 1 tablet (160 mg total) by mouth daily.  90 tablet  3  . fluocinonide cream (LIDEX) 9.44 % Apply 1 application topically daily as needed. FOR RASH      . furosemide (LASIX) 80 MG tablet Take 1 tablet (80 mg total) by mouth daily.  90 tablet  3  . glucose blood test strip Test blood sugar 4 times daily as instructed. Dx code: 250.92  400 each  2  . Insulin Isophane & Regular (HUMULIN 70/30 KWIKPEN) (70-30) 100 UNIT/ML SUPN Inject 30-35 Units into the skin 2 (two) times daily. Inject 35 units in the morning and 30 units in the evening.  10 pen  3  . insulin NPH-regular Human (NOVOLIN 70/30) (70-30) 100 UNIT/ML injection Inject 40 units before breakfast, 15 units before lunch and 30 units before dinner.  80 mL  1  . Insulin Pen Needle (PEN NEEDLES 31GX5/16") 31G X 8 MM MISC Use to inject insulin 2 times daily as instructed.  200 each  2  . Insulin Syringe-Needle U-100 (BD INSULIN SYRINGE ULTRAFINE) 31G X 5/16" 1 ML MISC Inject 3 times daily as instructed by physician.  300 each  prn  . isosorbide mononitrate (IMDUR) 30 MG 24 hr tablet Take 1 tablet (30 mg total) by mouth daily.  90 tablet  3  . metoprolol (LOPRESSOR) 50 MG tablet Take 1 tablet (50 mg total) by mouth 2 (two) times daily.  180 tablet  3  . nitroGLYCERIN (NITROSTAT) 0.4 MG SL tablet Place 1 tablet (0.4 mg total) under the tongue every 5 (five) minutes as needed for chest pain.  25 tablet  3  . Omega-3 Fatty Acids (FISH OIL PO) Take by mouth daily.      . pantoprazole (PROTONIX) 20 MG tablet Take 20 mg by mouth daily.      . tamsulosin (FLOMAX) 0.4 MG CAPS capsule Take  1 capsule (0.4 mg total) by mouth daily.  90 capsule  3   . TRUEPLUS LANCETS 28G MISC Use to test blood glucose 4 times daily as instructed. Dx code: 250.92  400 each  2   No current facility-administered medications on file prior to visit.    Allergies  Allergen Reactions  . Levemir [Insulin Detemir] Swelling    Patient had redness and swelling and tenderness at injection site.  . Codeine Other (See Comments)    Makes him "shakey", is OK with hydrocodone GI UPSET & TREMORS  . Lipitor [Atorvastatin]   . Novolog Mix [Insulin Aspart Prot & Aspart]     Causes skin to swell/itch at injection site    Review of Systems  Review of Systems  Constitutional: Positive for malaise/fatigue. Negative for fever.  HENT: Negative for congestion.   Eyes: Negative for discharge.  Respiratory: Negative for shortness of breath.   Cardiovascular: Negative for chest pain, palpitations and leg swelling.  Gastrointestinal: Negative for nausea, abdominal pain and diarrhea.  Genitourinary: Negative for dysuria.  Musculoskeletal: Positive for back pain and joint pain. Negative for falls.  Skin: Negative for rash.  Neurological: Negative for loss of consciousness and headaches.  Endo/Heme/Allergies: Negative for polydipsia.  Psychiatric/Behavioral: Negative for depression and suicidal ideas. The patient is not nervous/anxious and does not have insomnia.     Objective  BP 113/62  Pulse 66  Temp(Src) 98.3 F (36.8 C) (Oral)  Ht 5' 10.5" (1.791 m)  Wt 255 lb 6.4 oz (115.849 kg)  BMI 36.12 kg/m2  SpO2 98%  Physical Exam  Physical Exam  Constitutional: He is oriented to person, place, and time and well-developed, well-nourished, and in no distress. No distress.  HENT:  Head: Normocephalic and atraumatic.  Eyes: Conjunctivae are normal.  Neck: Neck supple. No thyromegaly present.  Cardiovascular: Normal rate, regular rhythm and normal heart sounds.   No murmur heard. Pulmonary/Chest: Effort normal and breath sounds normal. No respiratory distress.   Abdominal: He exhibits no distension and no mass. There is no tenderness.  Musculoskeletal: He exhibits no edema.  Neurological: He is alert and oriented to person, place, and time.  Skin: Skin is warm.  Psychiatric: Memory, affect and judgment normal.    Lab Results  Component Value Date   TSH 2.28 08/02/2014   Lab Results  Component Value Date   WBC 6.0 08/02/2014   HGB 13.4 08/02/2014   HCT 41.0 08/02/2014   MCV 93.3 08/02/2014   PLT 220.0 08/02/2014   Lab Results  Component Value Date   CREATININE 1.4 08/02/2014   BUN 27* 08/02/2014   NA 137 08/02/2014   K 3.7 08/02/2014   CL 101 08/02/2014   CO2 26 08/02/2014   Lab Results  Component Value Date   ALT 23 08/02/2014   AST 21 08/02/2014   ALKPHOS 55 08/02/2014   BILITOT 0.8 08/02/2014   Lab Results  Component Value Date   CHOL 121 08/02/2014   Lab Results  Component Value Date   HDL 21.10* 08/02/2014   Lab Results  Component Value Date   LDLCALC 31 04/09/2014   Lab Results  Component Value Date   TRIG 330.0* 08/02/2014   Lab Results  Component Value Date   CHOLHDL 6 08/02/2014     Assessment & Plan  Gout Normal uric acid  HTN (hypertension) Well controlled, no changes to meds. Encouraged heart healthy diet such as the DASH diet and exercise as tolerated.   Hyperlipidemia Tolerating statin, encouraged  heart healthy diet, avoid trans fats, minimize simple carbs and saturated fats. Increase exercise as tolerated  Diabetes mellitus type 2 in obese Worsening numbers increase insulin as discussed and minimize simple carbs  OSA (obstructive sleep apnea) Using CPAP routinely and feels it helps  BPH with obstruction/lower urinary tract symptoms Frequency noted, urine culture negative. Referred to urology  Melanoma Follows with Dr Nevada Crane of dermatology

## 2014-08-05 NOTE — Assessment & Plan Note (Signed)
Follows with Dr Nevada Crane of dermatology

## 2014-08-05 NOTE — Assessment & Plan Note (Signed)
Using CPAP routinely and feels it helps

## 2014-08-05 NOTE — Assessment & Plan Note (Signed)
Tolerating statin, encouraged heart healthy diet, avoid trans fats, minimize simple carbs and saturated fats. Increase exercise as tolerated 

## 2014-09-20 ENCOUNTER — Encounter (HOSPITAL_COMMUNITY): Payer: Self-pay | Admitting: Cardiology

## 2014-10-10 ENCOUNTER — Ambulatory Visit (INDEPENDENT_AMBULATORY_CARE_PROVIDER_SITE_OTHER): Payer: Commercial Managed Care - HMO | Admitting: Medical

## 2014-10-10 ENCOUNTER — Encounter: Payer: Self-pay | Admitting: Medical

## 2014-10-10 VITALS — BP 118/71 | HR 84 | Temp 97.8°F | Ht 70.5 in | Wt 255.0 lb

## 2014-10-10 DIAGNOSIS — J0100 Acute maxillary sinusitis, unspecified: Secondary | ICD-10-CM

## 2014-10-10 DIAGNOSIS — J329 Chronic sinusitis, unspecified: Secondary | ICD-10-CM | POA: Insufficient documentation

## 2014-10-10 MED ORDER — AZITHROMYCIN 250 MG PO TABS: ORAL_TABLET | ORAL | Status: DC

## 2014-10-10 MED ORDER — FLUTICASONE PROPIONATE 50 MCG/ACT NA SUSP: 2.0000 | Freq: Every day | NASAL | Status: DC

## 2014-10-10 MED ORDER — BENZONATATE 100 MG PO CAPS: 100.0000 mg | ORAL_CAPSULE | Freq: Three times a day (TID) | ORAL | Status: DC | PRN

## 2014-10-10 NOTE — Patient Instructions (Addendum)
Your appear to have a sinus infection. I am prescribing  Azithromycin antibiotic for the infection. To help with the nasal congestion I prescribed nasal steroid flonase. For your associated cough, I prescribed cough medicine benzonatate.  Rest, hydrate, tylenol for fever.  Follow up in 7 days or as needed.

## 2014-10-10 NOTE — Progress Notes (Signed)
Pre visit review using our clinic review tool, if applicable. No additional management support is needed unless otherwise documented below in the visit note. 

## 2014-10-10 NOTE — Assessment & Plan Note (Addendum)
Your appear to have a sinus infection. I am prescribing  Azithromycin antibiotic for the infection. To help with the nasal congestion I prescribed nasal steroid flonase. For your associated cough, I prescribed cough medicine benzonatate.   Note with the sinus infection and nasal congestion occasional lightheaded sensation definitely can be associated. However I did counsel with him and his wife that if he gets constant severe dizziness or any other neurologic type signs and symptoms as discussed then be seen at the emergency department. Patient and wife verbalized understanding. Note his neurologic exam was negative.

## 2014-10-10 NOTE — Progress Notes (Signed)
Subjective:    Patient ID: William Hendrix, male    DOB: [birth year], 73 y.o.   MRN: [id number]  HPI   Pt in today reporting  cough, nasal congestion, sinus pressure and runny nose for  4 Days. Feels warm fevers. Pt using mucinex.  Pt has hx of sinus infections and hx of sinus surgery  Associated symptoms( below yes or no)  Fever-yes Chills-yes with sweats Chest congestion-mild chest congestion Sneezing- no Itching eyes-no Sore throat- no Post-nasal drainage-yes Wheezing-no Purulent drainage-yes Fatigue-yes, mild.  Pt feels on and off light headed since congestion.  Hx of smoking but none since 1997.  Past Medical History  Diagnosis Date  . Obesity   . Edema   . DM (diabetes mellitus)   . Fatigue   . Gout   . Skin cancer   . CAD (coronary artery disease)     a. Stent 2000. b. Several caths since then with nonobst dz. c. 05/2013: IVUS-guided PTCA/DES to proximal-to-mid LAD, residual PDA disease for med rx unless recurrent angina. EF 55-60%.  . Ventral hernia   . Sinusitis   . Back problem   . Hyperlipidemia   . HTN (hypertension)   . Ejection fraction     55%, 07/2010, mild inferior hypo  . SOB (shortness of breath)   . Dizziness     Mild positional dizziness May, 2012  . History of pneumonia   . Melanoma     Melanoma removed from the left forearm with wide excision May, 2012  . Pneumonia     hx x4 young  . Sleep apnea     CPAP,  [name] since 2000  . GERD (gastroesophageal reflux disease)   . H/O hiatal hernia   . Arthritis   . Spinal stenosis     a. s/p surgical repair 2013  . Positive D-dimer     a. significant elevation ,hospital 07/2010, etiology unclear. b. D Dimer chronically > 20.  Marland Kitchen CKD (chronic kidney disease)      CKD stage III.. creatinine up to 1.5 in hospital after dye in October, 2011  . Nephrolithiasis     History   Social History  . Marital Status: Married    Spouse Name: N/A    Number of Children: N/A  . Years of Education: N/A    Occupational History  . [employer]    Social History Main Topics  . Smoking status: Former Smoker -- 3.00 packs/day for 30 years    Types: Cigarettes    Quit date: 09/17/1996  . Smokeless tobacco: Never Used  . Alcohol Use: No  . Drug Use: No  . Sexual Activity: Not Currently    Birth Control/ Protection: None   Other Topics Concern  . Not on file   Social History Narrative   Married and lives locally with his wife.  [employer].    Past Surgical History  Procedure Laterality Date  . Nose surgery    . Cardiac catheterization    . Coronary stent placement  2000    CAD  . Knee arthroscopy  90's    rt  . Cervical disc surgery  90's  . Lumbar laminectomy/decompression microdiscectomy  08/30/2012    Procedure: LUMBAR LAMINECTOMY/DECOMPRESSION MICRODISCECTOMY 2 LEVELS;  Surgeon: [name], MD;  Location: [place] [facility] OR[facility];  Service: Neurosurgery;  Laterality: Bilateral;  Bilateral Lumbar three-four Lumbar four-five Laminotomies  . Back surgery    . Coronary angioplasty with stent placement  07/26/2013    DES to RCA  extending to PDA    . Left heart catheterization with coronary angiogram N/A 05/19/2013    Procedure: LEFT HEART CATHETERIZATION WITH CORONARY ANGIOGRAM;  Surgeon: [name], MD;  Location: [facility] CATH LAB;  Service: Cardiovascular;  Laterality: N/A;  . Percutaneous coronary stent intervention (pci-s)  05/19/2013    Procedure: PERCUTANEOUS CORONARY STENT INTERVENTION (PCI-S);  Surgeon: [name], MD;  Location: [facility] CATH LAB;  Service: Cardiovascular;;  . Left and right heart catheterization with coronary angiogram N/A 07/26/2013    Procedure: LEFT AND RIGHT HEART CATHETERIZATION WITH CORONARY ANGIOGRAM;  Surgeon: [name], MD;  Location: [place] CATH LAB;  Service: Cardiovascular;  Laterality: N/A;    Family History  Problem Relation Age of Onset  . Prostate cancer    . Kidney cancer    . Cancer      Bladder cancer  . Coronary artery disease    . Cancer Mother       intestinal   . Heart disease Mother   . Cancer Father     prostate  . Migraines Daughter   . Leukemia Sister   . Heart disease Sister     Allergies  Allergen Reactions  . Levemir [Insulin Detemir] Swelling    Patient had redness and swelling and tenderness at injection site.  . Codeine Other (See Comments)    Makes him "shakey", is OK with hydrocodone GI UPSET & TREMORS  . Lipitor [Atorvastatin]   . Novolog Mix [Insulin Aspart Prot & Aspart]     Causes skin to swell/itch at injection site    Current Outpatient Prescriptions on File Prior to Visit  Medication Sig Dispense Refill  . acetaminophen (TYLENOL) 500 MG tablet Take 1,000 mg by mouth 2 (two) times daily.    Marland Kitchen allopurinol (ZYLOPRIM) 300 MG tablet Take 1 tablet (300 mg total) by mouth daily. 90 tablet 3  . aspirin EC 81 MG tablet Take 81 mg by mouth daily.    Marland Kitchen atorvastatin (LIPITOR) 20 MG tablet Take 1 tablet (20 mg total) by mouth daily. 90 tablet 3  . Blood Glucose Monitoring Suppl (BLOOD GLUCOSE METER) kit Use as instructed 1 each 0  . Cholecalciferol (VITAMIN D) 1000 UNITS capsule Take 1,000 Units by mouth at bedtime.     . clopidogrel (PLAVIX) 75 MG tablet Take 1 tablet (75 mg total) by mouth daily with breakfast. 90 tablet 3  . fenofibrate 160 MG tablet Take 1 tablet (160 mg total) by mouth daily. 90 tablet 3  . fluocinonide cream (LIDEX) 2.83 % Apply 1 application topically daily as needed. FOR RASH    . furosemide (LASIX) 80 MG tablet Take 1 tablet (80 mg total) by mouth daily. 90 tablet 3  . glucose blood test strip Test blood sugar 4 times daily as instructed. Dx code: 250.92 400 each 2  . Insulin Isophane & Regular (HUMULIN 70/30 KWIKPEN) (70-30) 100 UNIT/ML SUPN Inject 30-35 Units into the skin 2 (two) times daily. Inject 35 units in the morning and 30 units in the evening. 10 pen 3  . insulin NPH-regular Human (NOVOLIN 70/30) (70-30) 100 UNIT/ML injection Inject 40 units before breakfast, 15 units before  lunch and 30 units before dinner. 80 mL 1  . Insulin Pen Needle (PEN NEEDLES 31GX5/16") 31G X 8 MM MISC Use to inject insulin 2 times daily as instructed. 200 each 2  . Insulin Syringe-Needle U-100 (BD INSULIN SYRINGE ULTRAFINE) 31G X 5/16" 1 ML MISC Inject 3 times daily as instructed by  physician. 300 each prn  . isosorbide mononitrate (IMDUR) 30 MG 24 hr tablet Take 1 tablet (30 mg total) by mouth daily. 90 tablet 3  . metoprolol (LOPRESSOR) 50 MG tablet Take 1 tablet (50 mg total) by mouth 2 (two) times daily. 180 tablet 3  . nitroGLYCERIN (NITROSTAT) 0.4 MG SL tablet Place 1 tablet (0.4 mg total) under the tongue every 5 (five) minutes as needed for chest pain. 25 tablet 3  . Omega-3 Fatty Acids (FISH OIL PO) Take by mouth daily.    . pantoprazole (PROTONIX) 20 MG tablet Take 20 mg by mouth daily.    . tamsulosin (FLOMAX) 0.4 MG CAPS capsule Take 1 capsule (0.4 mg total) by mouth daily. 90 capsule 3  . TRUEPLUS LANCETS 28G MISC Use to test blood glucose 4 times daily as instructed. Dx code: 250.92 400 each 2   No current facility-administered medications on file prior to visit.    BP 118/71 mmHg  Pulse 84  Temp(Src) 97.8 F (36.6 C) (Oral)  Ht 5' 10.5" (1.791 m)  Wt 255 lb (115.667 kg)  BMI 36.06 kg/m2  SpO2 96%     Review of Systems  Constitutional: Positive for fever, chills and fatigue.  HENT: Positive for congestion, postnasal drip, sinus pressure and sore throat.   Respiratory: Positive for cough. Negative for wheezing.   Cardiovascular: Negative for chest pain and palpitations.  Musculoskeletal: Negative for neck pain.  Neurological: Positive for dizziness. Negative for tremors, seizures, syncope, speech difficulty, weakness, light-headedness, numbness and headaches.       On and off lightheaded sensation last couple of days but not reporting any acute dizziness during the interview.  Hematological: Negative for adenopathy. Does not bruise/bleed easily.   Psychiatric/Behavioral: Negative for behavioral problems and confusion.       Objective:   Physical Exam   General  Mental Status - Alert. General Appearance - Well groomed. Not in acute distress.  Skin Rashes- No Rashes.  HEENT Head- Normal. Ear Auditory Canal - Left- Normal. Right - Normal.Tympanic Membrane- Left- Normal. Right- Normal. Eye Sclera/Conjunctiva- Left- Normal. Right- Normal. Nose & Sinuses Nasal Mucosa- Left-  Boggy and Congested. Right-  Boggy and  Congested.Bilateral maxillary and frontal sinus pressure. Mouth & Throat Lips: Upper Lip- Normal: no dryness, cracking, pallor, cyanosis, or vesicular eruption. Lower Lip-Normal: no dryness, cracking, pallor, cyanosis or vesicular eruption. Buccal Mucosa- Bilateral- No Aphthous ulcers. Oropharynx- No Discharge or Erythema. Tonsils: Characteristics- Bilateral- No Erythema or Congestion. Size/Enlargement- Bilateral- No enlargement. Discharge- bilateral-None.  Neck Neck- Supple. No Masses.   Chest and Lung Exam Auscultation: Breath Sounds:-Clear even and unlabored.  Cardiovascular Regular rate and rhythm   Neurologic Cranial Nerve exam:- CN III-XII intact(No nystagmus), symmetric smile. Romberg Exam:- Negative.  .Finger to Nose:- Normal/Intact Strength:- 5/5 equal and symmetric strength both upper and lower extremities.       Assessment & Plan:

## 2014-10-19 ENCOUNTER — Other Ambulatory Visit: Payer: Self-pay | Admitting: Internal Medicine

## 2014-11-05 ENCOUNTER — Encounter: Payer: Self-pay | Admitting: Family Medicine

## 2014-11-05 ENCOUNTER — Ambulatory Visit (INDEPENDENT_AMBULATORY_CARE_PROVIDER_SITE_OTHER): Payer: Commercial Managed Care - HMO | Admitting: Family Medicine

## 2014-11-05 ENCOUNTER — Other Ambulatory Visit: Payer: Self-pay | Admitting: Family Medicine

## 2014-11-05 VITALS — BP 113/66 | HR 72 | Temp 97.5°F | Ht 70.5 in | Wt 254.2 lb

## 2014-11-05 DIAGNOSIS — E1169 Type 2 diabetes mellitus with other specified complication: Secondary | ICD-10-CM

## 2014-11-05 DIAGNOSIS — E119 Type 2 diabetes mellitus without complications: Secondary | ICD-10-CM

## 2014-11-05 DIAGNOSIS — I10 Essential (primary) hypertension: Secondary | ICD-10-CM | POA: Diagnosis not present

## 2014-11-05 DIAGNOSIS — E785 Hyperlipidemia, unspecified: Secondary | ICD-10-CM

## 2014-11-05 DIAGNOSIS — G4733 Obstructive sleep apnea (adult) (pediatric): Secondary | ICD-10-CM

## 2014-11-05 DIAGNOSIS — E782 Mixed hyperlipidemia: Secondary | ICD-10-CM | POA: Diagnosis not present

## 2014-11-05 DIAGNOSIS — E663 Overweight: Secondary | ICD-10-CM | POA: Diagnosis not present

## 2014-11-05 DIAGNOSIS — N138 Other obstructive and reflux uropathy: Secondary | ICD-10-CM

## 2014-11-05 DIAGNOSIS — N401 Enlarged prostate with lower urinary tract symptoms: Secondary | ICD-10-CM

## 2014-11-05 DIAGNOSIS — E669 Obesity, unspecified: Secondary | ICD-10-CM

## 2014-11-05 DIAGNOSIS — R300 Dysuria: Secondary | ICD-10-CM | POA: Diagnosis not present

## 2014-11-05 NOTE — Progress Notes (Signed)
Pre visit review using our clinic review tool, if applicable. No additional management support is needed unless otherwise documented below in the visit note. 

## 2014-11-06 ENCOUNTER — Other Ambulatory Visit: Payer: Self-pay | Admitting: Family Medicine

## 2014-11-06 LAB — CBC
HEMATOCRIT: 40.8 % (ref 39.0–52.0)
HEMOGLOBIN: 14.1 g/dL (ref 13.0–17.0)
MCHC: 34.5 g/dL (ref 30.0–36.0)
MCV: 90 fl (ref 78.0–100.0)
Platelets: 228 10*3/uL (ref 150.0–400.0)
RBC: 4.54 Mil/uL (ref 4.22–5.81)
RDW: 13.6 % (ref 11.5–15.5)
WBC: 7.1 10*3/uL (ref 4.0–10.5)

## 2014-11-06 LAB — URINALYSIS
Bilirubin Urine: NEGATIVE
Hgb urine dipstick: NEGATIVE
KETONES UR: NEGATIVE
LEUKOCYTES UA: NEGATIVE
NITRITE: NEGATIVE
SPECIFIC GRAVITY, URINE: 1.02 (ref 1.000–1.030)
Total Protein, Urine: NEGATIVE
URINE GLUCOSE: NEGATIVE
UROBILINOGEN UA: 0.2 (ref 0.0–1.0)
pH: 5.5 (ref 5.0–8.0)

## 2014-11-06 LAB — COMPREHENSIVE METABOLIC PANEL
ALK PHOS: 57 U/L (ref 39–117)
ALT: 21 U/L (ref 0–53)
AST: 22 U/L (ref 0–37)
Albumin: 4 g/dL (ref 3.5–5.2)
BILIRUBIN TOTAL: 0.4 mg/dL (ref 0.2–1.2)
BUN: 26 mg/dL — AB (ref 6–23)
CALCIUM: 10.2 mg/dL (ref 8.4–10.5)
CHLORIDE: 101 meq/L (ref 96–112)
CO2: 28 mEq/L (ref 19–32)
CREATININE: 1.46 mg/dL (ref 0.40–1.50)
GFR: 50.07 mL/min — ABNORMAL LOW (ref >60.00)
GLUCOSE: 163 mg/dL — AB (ref 70–99)
POTASSIUM: 4 meq/L (ref 3.5–5.1)
Sodium: 139 mEq/L (ref 135–145)
Total Protein: 7.2 g/dL (ref 6.0–8.3)

## 2014-11-06 LAB — URINE CULTURE
Colony Count: NO GROWTH
ORGANISM ID, BACTERIA: NO GROWTH

## 2014-11-06 LAB — LIPID PANEL
Cholesterol: 165 mg/dL (ref 0–200)
HDL: 23.2 mg/dL — ABNORMAL LOW (ref >39.00)
TRIGLYCERIDES: 492 mg/dL — AB (ref 0.0–149.0)
Total CHOL/HDL Ratio: 7

## 2014-11-06 LAB — TSH: TSH: 3.7 u[IU]/mL (ref 0.35–4.50)

## 2014-11-06 LAB — LDL CHOLESTEROL, DIRECT: Direct LDL: 73 mg/dL

## 2014-11-06 LAB — HEMOGLOBIN A1C: HEMOGLOBIN A1C: 10.5 % — AB (ref 4.6–6.5)

## 2014-11-07 ENCOUNTER — Encounter: Payer: Self-pay | Admitting: Family Medicine

## 2014-11-08 MED ORDER — ATORVASTATIN CALCIUM 40 MG PO TABS
40.0000 mg | ORAL_TABLET | Freq: Every day | ORAL | Status: DC
Start: 2014-11-08 — End: 2015-01-07

## 2014-11-08 NOTE — Telephone Encounter (Signed)
Rx sent to the pharmacy by e-script.//AB/CMA 

## 2014-11-11 ENCOUNTER — Encounter: Payer: Self-pay | Admitting: Family Medicine

## 2014-11-11 NOTE — Assessment & Plan Note (Signed)
Encouraged DASH diet, decrease po intake and increase exercise as tolerated. Needs 7-8 hours of sleep nightly. Avoid trans fats, eat small, frequent meals every 4-5 hours with lean proteins, complex carbs and healthy fats. Minimize simple carbs, GMO foods. 

## 2014-11-11 NOTE — Assessment & Plan Note (Signed)
using CPAP despite congestion with recent infection

## 2014-11-11 NOTE — Assessment & Plan Note (Signed)
Has been using 70/30 twice daily with 40 units in am and 15 units later. He frequently only eats 2 meals dialy and has numbers above 200  In am encouraged to expand to 3 meals and use his 70/30 mix tid, 15 units in am, then 30 midday and 1 units later. hgba1c acceptable, minimize simple carbs. Increase exercise as tolerated. Continue current meds

## 2014-11-11 NOTE — Progress Notes (Signed)
William Hendrix  [id number] 06/08/1941 11/11/2014      Progress Note-Follow Up  Subjective  Chief Complaint  Chief Complaint  Patient presents with  . Follow-up    3 mos    HPI  Patient is a 74 y.o. male in today for routine medical care. In oday with his wife and is noting labile blood sugars, has had numbers over 200 but also numbers above 300. No numbers below 100. Skips meals frequently often only eating 2 a day so only taking 2 doses of Insulin daily. 40 units in am and 15 units midday. Acknowledges polyuria, polydipsia. No recent illness. Denies CP/palp/SOB/HA/fevers/GI or GU c/o. Taking meds as prescribed. Has noted some increased congestion this week with yellow congestion from nose but no ear or throat pain. No significant chest congestion.   Past Medical History  Diagnosis Date  . Obesity   . Edema   . DM (diabetes mellitus)   . Fatigue   . Gout   . Skin cancer   . CAD (coronary artery disease)     a. Stent 2000. b. Several caths since then with nonobst dz. c. 05/2013: IVUS-guided PTCA/DES to proximal-to-mid LAD, residual PDA disease for med rx unless recurrent angina. EF 55-60%.  . Ventral hernia   . Sinusitis   . Back problem   . Hyperlipidemia   . HTN (hypertension)   . Ejection fraction     55%, 07/2010, mild inferior hypo  . SOB (shortness of breath)   . Dizziness     Mild positional dizziness May, 2012  . History of pneumonia   . Melanoma     Melanoma removed from the left forearm with wide excision May, 2012  . Pneumonia     hx x4 young  . Sleep apnea     CPAP,  [name] since 2000  . GERD (gastroesophageal reflux disease)   . H/O hiatal hernia   . Arthritis   . Spinal stenosis     a. s/p surgical repair 2013  . Positive D-dimer     a. significant elevation ,hospital 07/2010, etiology unclear. b. D Dimer chronically > 20.  Marland Kitchen CKD (chronic kidney disease)      CKD stage III.. creatinine up to 1.5 in hospital after dye in October, 2011  .  Nephrolithiasis     Past Surgical History  Procedure Laterality Date  . Nose surgery    . Cardiac catheterization    . Coronary stent placement  2000    CAD  . Knee arthroscopy  90's    rt  . Cervical disc surgery  90's  . Lumbar laminectomy/decompression microdiscectomy  08/30/2012    Procedure: LUMBAR LAMINECTOMY/DECOMPRESSION MICRODISCECTOMY 2 LEVELS;  Surgeon: [name], MD;  Location: [place] NEURO ORS;  Service: Neurosurgery;  Laterality: Bilateral;  Bilateral Lumbar three-four Lumbar four-five Laminotomies  . Back surgery    . Coronary angioplasty with stent placement  07/26/2013    DES to RCA extending to PDA    . Left heart catheterization with coronary angiogram N/A 05/19/2013    Procedure: LEFT HEART CATHETERIZATION WITH CORONARY ANGIOGRAM;  Surgeon: [name], MD;  Location[facility] CATH LAB;  Service: Cardiovascular;  Laterality: N/A;  . Percutaneous coronary stent intervention (pci-s)  05/19/2013    Procedure: PERCUTANEOUS CORONARY STENT INTERVENTION (PCI-S);  Surgeon: [name], MD;  Location: [facility] CATH LAB;  Service: Cardiovascular;;  . Left and right heart catheterization with coronary angiogram N/A 07/26/2013    Procedure: LEFT AND  RIGHT HEART CATHETERIZATION WITH CORONARY ANGIOGRAM;  Surgeon: [name], MD;  Location: [facility] CATH LAB;  Service: Cardiovascular;  Laterality: N/A;    Family History  Problem Relation Age of Onset  . Prostate cancer    . Kidney cancer    . Cancer      Bladder cancer  . Coronary artery disease    . Cancer Mother     intestinal   . Heart disease Mother   . Cancer Father     prostate  . Migraines Daughter   . Leukemia Sister   . Heart disease Sister     History   Social History  . Marital Status: Married    Spouse Name: N/A    Number of Children: N/A  . Years of Education: N/A   Occupational History  . [employer]    Social History Main Topics  . Smoking status: Former Smoker -- 3.00 packs/day for 30 years    Types:  Cigarettes    Quit date: 09/17/1996  . Smokeless tobacco: Never Used  . Alcohol Use: No  . Drug Use: No  . Sexual Activity: Not Currently    Birth Control/ Protection: None   Other Topics Concern  . Not on file   Social History Narrative   Married and lives locally with his wife.  [employer].    Current Outpatient Prescriptions on File Prior to Visit  Medication Sig Dispense Refill  . acetaminophen (TYLENOL) 500 MG tablet Take 1,000 mg by mouth 2 (two) times daily.    Marland Kitchen allopurinol (ZYLOPRIM) 300 MG tablet Take 1 tablet (300 mg total) by mouth daily. 90 tablet 3  . aspirin EC 81 MG tablet Take 81 mg by mouth daily.    . Blood Glucose Monitoring Suppl (BLOOD GLUCOSE METER) kit Use as instructed 1 each 0  . Cholecalciferol (VITAMIN D) 1000 UNITS capsule Take 1,000 Units by mouth at bedtime.     . clopidogrel (PLAVIX) 75 MG tablet Take 1 tablet (75 mg total) by mouth daily with breakfast. 90 tablet 3  . fenofibrate 160 MG tablet Take 1 tablet (160 mg total) by mouth daily. 90 tablet 3  . fluocinonide cream (LIDEX) 5.63 % Apply 1 application topically daily as needed. FOR RASH    . fluticasone (FLONASE) 50 MCG/ACT nasal spray Place 2 sprays into both nostrils daily. 16 g 1  . furosemide (LASIX) 80 MG tablet Take 1 tablet (80 mg total) by mouth daily. 90 tablet 3  . Insulin Isophane & Regular (HUMULIN 70/30 KWIKPEN) (70-30) 100 UNIT/ML SUPN Inject 30-35 Units into the skin 2 (two) times daily. Inject 35 units in the morning and 30 units in the evening. 10 pen 3  . insulin NPH-regular Human (NOVOLIN 70/30) (70-30) 100 UNIT/ML injection Inject 40 units before breakfast, 15 units before lunch and 30 units before dinner. 80 mL 1  . Insulin Pen Needle (PEN NEEDLES 31GX5/16") 31G X 8 MM MISC Use to inject insulin 2 times daily as instructed. 200 each 2  . Insulin Syringe-Needle U-100 (BD INSULIN SYRINGE ULTRAFINE) 31G X 5/16" 1 ML MISC Inject 3 times daily as instructed by physician. 300 each prn  .  isosorbide mononitrate (IMDUR) 30 MG 24 hr tablet Take 1 tablet (30 mg total) by mouth daily. 90 tablet 3  . metoprolol (LOPRESSOR) 50 MG tablet Take 1 tablet (50 mg total) by mouth 2 (two) times daily. 180 tablet 3  . nitroGLYCERIN (NITROSTAT) 0.4 MG SL tablet Place 1 tablet (0.4 mg  total) under the tongue every 5 (five) minutes as needed for chest pain. 25 tablet 3  . Omega-3 Fatty Acids (FISH OIL PO) Take by mouth daily.    . pantoprazole (PROTONIX) 20 MG tablet Take 20 mg by mouth daily.    . tamsulosin (FLOMAX) 0.4 MG CAPS capsule Take 1 capsule (0.4 mg total) by mouth daily. 90 capsule 3  . TRUEPLUS LANCETS 28G MISC Use to test blood glucose 4 times daily as instructed. Dx code: 250.92 400 each 2  . TRUETEST TEST test strip TEST BLOOD SUGAR FOUR TIMES DAILY  AS  INSTRUCTED 400 each 2   No current facility-administered medications on file prior to visit.    Allergies  Allergen Reactions  . Levemir [Insulin Detemir] Swelling    Patient had redness and swelling and tenderness at injection site.  . Codeine Other (See Comments)    Makes him "shakey", is OK with hydrocodone GI UPSET & TREMORS  . Lipitor [Atorvastatin]   . Novolog Mix [Insulin Aspart Prot & Aspart]     Causes skin to swell/itch at injection site    Review of Systems  Review of Systems  Constitutional: Negative for fever and malaise/fatigue.  HENT: Negative for congestion.   Eyes: Negative for discharge.  Respiratory: Negative for shortness of breath.   Cardiovascular: Negative for chest pain, palpitations and leg swelling.  Gastrointestinal: Negative for nausea, abdominal pain and diarrhea.  Genitourinary: Negative for dysuria.  Musculoskeletal: Negative for falls.  Skin: Negative for rash.  Neurological: Negative for loss of consciousness and headaches.  Endo/Heme/Allergies: Negative for polydipsia.  Psychiatric/Behavioral: Negative for depression and suicidal ideas. The patient is not nervous/anxious and does  not have insomnia.     Objective  BP 113/66 mmHg  Pulse 72  Temp(Src) 97.5 F (36.4 C) (Oral)  Ht 5' 10.5" (1.791 m)  Wt 254 lb 3.2 oz (115.304 kg)  BMI 35.95 kg/m2  SpO2 97%  Physical Exam  Physical Exam  Constitutional: He is oriented to person, place, and time and well-developed, well-nourished, and in no distress. No distress.  HENT:  Head: Normocephalic and atraumatic.  Eyes: Conjunctivae are normal.  Neck: Neck supple. No thyromegaly present.  Cardiovascular: Normal rate, regular rhythm and normal heart sounds.   No murmur heard. Pulmonary/Chest: Effort normal and breath sounds normal. No respiratory distress.  Abdominal: He exhibits no distension and no mass. There is no tenderness.  Musculoskeletal: He exhibits no edema.  Neurological: He is alert and oriented to person, place, and time.  Skin: Skin is warm.  Psychiatric: Memory, affect and judgment normal.    Lab Results  Component Value Date   TSH 3.70 11/05/2014   Lab Results  Component Value Date   WBC 7.1 11/05/2014   HGB 14.1 11/05/2014   HCT 40.8 11/05/2014   MCV 90.0 11/05/2014   PLT 228.0 11/05/2014   Lab Results  Component Value Date   CREATININE 1.46 11/05/2014   BUN 26* 11/05/2014   NA 139 11/05/2014   K 4.0 11/05/2014   CL 101 11/05/2014   CO2 28 11/05/2014   Lab Results  Component Value Date   ALT 21 11/05/2014   AST 22 11/05/2014   ALKPHOS 57 11/05/2014   BILITOT 0.4 11/05/2014   Lab Results  Component Value Date   CHOL 165 11/05/2014   Lab Results  Component Value Date   HDL 23.20* 11/05/2014   Lab Results  Component Value Date   LDLCALC 31 04/09/2014   Lab Results  Component Value Date   TRIG 492.0* 11/05/2014   Lab Results  Component Value Date   CHOLHDL 7 11/05/2014     Assessment & Plan  HTN (hypertension) Well controlled, no changes to meds. Encouraged heart healthy diet such as the DASH diet and exercise as tolerated.    Diabetes mellitus type 2 in  obese Has been using 70/30 twice daily with 40 units in am and 15 units later. He frequently only eats 2 meals dialy and has numbers above 200  In am encouraged to expand to 3 meals and use his 70/30 mix tid, 15 units in am, then 30 midday and 1 units later. hgba1c acceptable, minimize simple carbs. Increase exercise as tolerated. Continue current meds   BPH with obstruction/lower urinary tract symptoms Has followed with Dr McDiarmid of urology in past. May consider referral in future.   OSA (obstructive sleep apnea) using CPAP despite congestion with recent infection   Overweight Encouraged DASH diet, decrease po intake and increase exercise as tolerated. Needs 7-8 hours of sleep nightly. Avoid trans fats, eat small, frequent meals every 4-5 hours with lean proteins, complex carbs and healthy fats. Minimize simple carbs, GMO foods.   Hyperlipidemia Tolerating statin, encouraged heart healthy diet, avoid trans fats, minimize simple carbs and saturated fats. Increase exercise as tolerated

## 2014-11-11 NOTE — Assessment & Plan Note (Signed)
Tolerating statin, encouraged heart healthy diet, avoid trans fats, minimize simple carbs and saturated fats. Increase exercise as tolerated 

## 2014-11-11 NOTE — Assessment & Plan Note (Signed)
Has followed with Dr McDiarmid of urology in past. May consider referral in future.

## 2014-11-11 NOTE — Assessment & Plan Note (Signed)
Well controlled, no changes to meds. Encouraged heart healthy diet such as the DASH diet and exercise as tolerated.  °

## 2014-12-04 ENCOUNTER — Other Ambulatory Visit: Payer: Self-pay | Admitting: Cardiology

## 2014-12-04 NOTE — Telephone Encounter (Signed)
Is this ok to refill or should it be sent to pcp? Please advise. Thanks, MI

## 2014-12-04 NOTE — Telephone Encounter (Signed)
According to the pts chart Dr Ron Parker refilled his last RX for Allopurinol as a courtesy most likely but this should be sent to his PCP as this is not a cardiac medication.

## 2014-12-10 DIAGNOSIS — R350 Frequency of micturition: Secondary | ICD-10-CM | POA: Diagnosis not present

## 2014-12-10 DIAGNOSIS — N3941 Urge incontinence: Secondary | ICD-10-CM | POA: Diagnosis not present

## 2014-12-15 ENCOUNTER — Encounter (HOSPITAL_COMMUNITY): Payer: Self-pay | Admitting: Physical Medicine and Rehabilitation

## 2014-12-15 ENCOUNTER — Emergency Department (HOSPITAL_COMMUNITY): Payer: Commercial Managed Care - HMO

## 2014-12-15 ENCOUNTER — Emergency Department (HOSPITAL_COMMUNITY)
Admission: EM | Admit: 2014-12-15 | Discharge: 2014-12-15 | Disposition: A | Discharge status: Home / Self Care | Payer: Commercial Managed Care - HMO | Attending: Emergency Medicine | Admitting: Emergency Medicine

## 2014-12-15 DIAGNOSIS — Z9981 Dependence on supplemental oxygen: Secondary | ICD-10-CM | POA: Diagnosis not present

## 2014-12-15 DIAGNOSIS — Z7902 Long term (current) use of antithrombotics/antiplatelets: Secondary | ICD-10-CM | POA: Diagnosis not present

## 2014-12-15 DIAGNOSIS — I129 Hypertensive chronic kidney disease with stage 1 through stage 4 chronic kidney disease, or unspecified chronic kidney disease: Secondary | ICD-10-CM | POA: Diagnosis not present

## 2014-12-15 DIAGNOSIS — Z85828 Personal history of other malignant neoplasm of skin: Secondary | ICD-10-CM | POA: Diagnosis not present

## 2014-12-15 DIAGNOSIS — Z79899 Other long term (current) drug therapy: Secondary | ICD-10-CM | POA: Insufficient documentation

## 2014-12-15 DIAGNOSIS — Z794 Long term (current) use of insulin: Secondary | ICD-10-CM | POA: Diagnosis not present

## 2014-12-15 DIAGNOSIS — I10 Essential (primary) hypertension: Secondary | ICD-10-CM | POA: Diagnosis not present

## 2014-12-15 DIAGNOSIS — E119 Type 2 diabetes mellitus without complications: Secondary | ICD-10-CM | POA: Insufficient documentation

## 2014-12-15 DIAGNOSIS — G4730 Sleep apnea, unspecified: Secondary | ICD-10-CM | POA: Diagnosis not present

## 2014-12-15 DIAGNOSIS — K5710 Diverticulosis of small intestine without perforation or abscess without bleeding: Secondary | ICD-10-CM | POA: Insufficient documentation

## 2014-12-15 DIAGNOSIS — Z87891 Personal history of nicotine dependence: Secondary | ICD-10-CM | POA: Insufficient documentation

## 2014-12-15 DIAGNOSIS — R109 Unspecified abdominal pain: Secondary | ICD-10-CM

## 2014-12-15 DIAGNOSIS — E669 Obesity, unspecified: Secondary | ICD-10-CM | POA: Diagnosis not present

## 2014-12-15 DIAGNOSIS — M109 Gout, unspecified: Secondary | ICD-10-CM | POA: Insufficient documentation

## 2014-12-15 DIAGNOSIS — Z7982 Long term (current) use of aspirin: Secondary | ICD-10-CM | POA: Insufficient documentation

## 2014-12-15 DIAGNOSIS — R1013 Epigastric pain: Secondary | ICD-10-CM | POA: Diagnosis not present

## 2014-12-15 DIAGNOSIS — Z8701 Personal history of pneumonia (recurrent): Secondary | ICD-10-CM | POA: Diagnosis not present

## 2014-12-15 DIAGNOSIS — Z87442 Personal history of urinary calculi: Secondary | ICD-10-CM | POA: Insufficient documentation

## 2014-12-15 DIAGNOSIS — M1990 Unspecified osteoarthritis, unspecified site: Secondary | ICD-10-CM | POA: Insufficient documentation

## 2014-12-15 DIAGNOSIS — I714 Abdominal aortic aneurysm, without rupture, unspecified: Secondary | ICD-10-CM

## 2014-12-15 DIAGNOSIS — K219 Gastro-esophageal reflux disease without esophagitis: Secondary | ICD-10-CM | POA: Insufficient documentation

## 2014-12-15 DIAGNOSIS — I509 Heart failure, unspecified: Secondary | ICD-10-CM | POA: Diagnosis not present

## 2014-12-15 DIAGNOSIS — K5712 Diverticulitis of small intestine without perforation or abscess without bleeding: Secondary | ICD-10-CM | POA: Diagnosis not present

## 2014-12-15 DIAGNOSIS — I2510 Atherosclerotic heart disease of native coronary artery without angina pectoris: Secondary | ICD-10-CM | POA: Diagnosis not present

## 2014-12-15 DIAGNOSIS — Z7951 Long term (current) use of inhaled steroids: Secondary | ICD-10-CM | POA: Insufficient documentation

## 2014-12-15 DIAGNOSIS — N183 Chronic kidney disease, stage 3 (moderate): Secondary | ICD-10-CM | POA: Insufficient documentation

## 2014-12-15 DIAGNOSIS — R0602 Shortness of breath: Secondary | ICD-10-CM | POA: Diagnosis not present

## 2014-12-15 DIAGNOSIS — R079 Chest pain, unspecified: Secondary | ICD-10-CM | POA: Diagnosis not present

## 2014-12-15 DIAGNOSIS — E785 Hyperlipidemia, unspecified: Secondary | ICD-10-CM | POA: Diagnosis not present

## 2014-12-15 DIAGNOSIS — K5732 Diverticulitis of large intestine without perforation or abscess without bleeding: Secondary | ICD-10-CM | POA: Diagnosis not present

## 2014-12-15 HISTORY — DX: Abdominal aortic aneurysm, without rupture: I71.4

## 2014-12-15 HISTORY — DX: Abdominal aortic aneurysm, without rupture, unspecified: I71.40

## 2014-12-15 LAB — CBC WITH DIFFERENTIAL/PLATELET
BASOS PCT: 0 % (ref 0–1)
Basophils Absolute: 0 10*3/uL (ref 0.0–0.1)
EOS PCT: 1 % (ref 0–5)
Eosinophils Absolute: 0.1 10*3/uL (ref 0.0–0.7)
HCT: 40.9 % (ref 39.0–52.0)
Hemoglobin: 14.3 g/dL (ref 13.0–17.0)
LYMPHS PCT: 33 % (ref 12–46)
Lymphs Abs: 2.9 10*3/uL (ref 0.7–4.0)
MCH: 31.2 pg (ref 26.0–34.0)
MCHC: 35 g/dL (ref 30.0–36.0)
MCV: 89.1 fL (ref 78.0–100.0)
MONOS PCT: 7 % (ref 3–12)
Monocytes Absolute: 0.6 10*3/uL (ref 0.1–1.0)
NEUTROS PCT: 59 % (ref 43–77)
Neutro Abs: 5.2 10*3/uL (ref 1.7–7.7)
Platelets: 180 10*3/uL (ref 150–400)
RBC: 4.59 MIL/uL (ref 4.22–5.81)
RDW: 13.8 % (ref 11.5–15.5)
WBC: 8.7 10*3/uL (ref 4.0–10.5)

## 2014-12-15 LAB — COMPREHENSIVE METABOLIC PANEL
ALT: 25 U/L (ref 0–53)
AST: 25 U/L (ref 0–37)
Albumin: 3.9 g/dL (ref 3.5–5.2)
Alkaline Phosphatase: 55 U/L (ref 39–117)
Anion gap: 5 (ref 5–15)
BUN: 20 mg/dL (ref 6–23)
CHLORIDE: 101 mmol/L (ref 96–112)
CO2: 32 mmol/L (ref 19–32)
CREATININE: 1.37 mg/dL — AB (ref 0.50–1.35)
Calcium: 9.9 mg/dL (ref 8.4–10.5)
GFR calc non Af Amer: 49 mL/min — ABNORMAL LOW (ref >90)
GFR, EST AFRICAN AMERICAN: 57 mL/min — AB (ref >90)
GLUCOSE: 287 mg/dL — AB (ref 70–99)
Potassium: 3.7 mmol/L (ref 3.5–5.1)
SODIUM: 138 mmol/L (ref 135–145)
Total Bilirubin: 0.9 mg/dL (ref 0.3–1.2)
Total Protein: 7 g/dL (ref 6.0–8.3)

## 2014-12-15 LAB — I-STAT TROPONIN, ED: TROPONIN I, POC: 0 ng/mL (ref 0.00–0.08)

## 2014-12-15 LAB — TROPONIN I: Troponin I: <0.03 ng/mL (ref <0.031)

## 2014-12-15 LAB — LIPASE, BLOOD: Lipase: 37 U/L (ref 11–59)

## 2014-12-15 MED ORDER — OXYCODONE-ACETAMINOPHEN 5-325 MG PO TABS: 1.0000 | ORAL_TABLET | Freq: Four times a day (QID) | ORAL | Status: DC | PRN

## 2014-12-15 MED ORDER — METRONIDAZOLE 500 MG PO TABS
500.0000 mg | ORAL_TABLET | Freq: Once | ORAL | Status: AC
  Administered 2014-12-15: 500 mg via ORAL
  Filled 2014-12-15: qty 1

## 2014-12-15 MED ORDER — IOHEXOL 300 MG/ML  SOLN
100.0000 mL | Freq: Once | INTRAMUSCULAR | Status: AC | PRN
  Administered 2014-12-15: 100 mL via INTRAVENOUS

## 2014-12-15 MED ORDER — HYDROCODONE-ACETAMINOPHEN 5-325 MG PO TABS: 1.0000 | ORAL_TABLET | ORAL | Status: DC | PRN

## 2014-12-15 MED ORDER — IOHEXOL 300 MG/ML  SOLN
25.0000 mL | Freq: Once | INTRAMUSCULAR | Status: AC | PRN
  Administered 2014-12-15: 25 mL via ORAL

## 2014-12-15 MED ORDER — MORPHINE SULFATE 4 MG/ML IJ SOLN
4.0000 mg | INTRAMUSCULAR | Status: DC | PRN
  Administered 2014-12-15: 4 mg via INTRAVENOUS
  Filled 2014-12-15: qty 1

## 2014-12-15 MED ORDER — SODIUM CHLORIDE 0.9 % IV SOLN
Freq: Once | INTRAVENOUS | Status: AC
  Administered 2014-12-15: 16:00:00 via INTRAVENOUS

## 2014-12-15 MED ORDER — ONDANSETRON HCL 4 MG/2ML IJ SOLN
4.0000 mg | Freq: Once | INTRAMUSCULAR | Status: AC
  Administered 2014-12-15: 4 mg via INTRAVENOUS
  Filled 2014-12-15: qty 2

## 2014-12-15 MED ORDER — METRONIDAZOLE 500 MG PO TABS: 500.0000 mg | ORAL_TABLET | Freq: Two times a day (BID) | ORAL | Status: DC

## 2014-12-15 MED ORDER — OXYCODONE-ACETAMINOPHEN 5-325 MG PO TABS
2.0000 | ORAL_TABLET | Freq: Once | ORAL | Status: AC
  Administered 2014-12-15: 2 via ORAL
  Filled 2014-12-15: qty 2

## 2014-12-15 MED ORDER — CIPROFLOXACIN HCL 500 MG PO TABS
500.0000 mg | ORAL_TABLET | Freq: Once | ORAL | Status: AC
  Administered 2014-12-15: 500 mg via ORAL
  Filled 2014-12-15: qty 1

## 2014-12-15 MED ORDER — CIPROFLOXACIN HCL 500 MG PO TABS: 500.0000 mg | ORAL_TABLET | Freq: Two times a day (BID) | ORAL | Status: DC

## 2014-12-15 NOTE — ED Notes (Signed)
Pt presents to department for evaluation of epigastric pain and SOB. 8/10 pain upon arrival to ED. Describes as constant sharp pain radiating to back. Also states difficulty breathing on exertion and diaphoresis. Pt is alert and oriented x4. History of STENT x3. Took (3) sublingual nitroglycerin at home without relief.

## 2014-12-15 NOTE — Discharge Instructions (Signed)
Return to the emergency with any changes. Return with nausea vomiting, chest pain, shortness of breath, fever, or any new symptoms. Follow up routinely with your primary care physician.  Diverticulitis Diverticulitis is inflammation or infection of small pouches in your colon that form when you have a condition called diverticulosis. The pouches in your colon are called diverticula. Your colon, or large intestine, is where water is absorbed and stool is formed. Complications of diverticulitis can include:  Bleeding.  Severe infection.  Severe pain.  Perforation of your colon.  Obstruction of your colon. CAUSES  Diverticulitis is caused by bacteria. Diverticulitis happens when stool becomes trapped in diverticula. This allows bacteria to grow in the diverticula, which can lead to inflammation and infection. RISK FACTORS People with diverticulosis are at risk for diverticulitis. Eating a diet that does not include enough fiber from fruits and vegetables may make diverticulitis more likely to develop. SYMPTOMS  Symptoms of diverticulitis may include:  Abdominal pain and tenderness. The pain is normally located on the left side of the abdomen, but may occur in other areas.  Fever and chills.  Bloating.  Cramping.  Nausea.  Vomiting.  Constipation.  Diarrhea.  Blood in your stool. DIAGNOSIS  Your health care provider will ask you about your medical history and do a physical exam. You may need to have tests done because many medical conditions can cause the same symptoms as diverticulitis. Tests may include:  Blood tests.  Urine tests.  Imaging tests of the abdomen, including X-rays and CT scans. When your condition is under control, your health care provider may recommend that you have a colonoscopy. A colonoscopy can show how severe your diverticula are and whether something else is causing your symptoms. TREATMENT  Most cases of diverticulitis are mild and can be  treated at home. Treatment may include:  Taking over-the-counter pain medicines.  Following a clear liquid diet.  Taking antibiotic medicines by mouth for 7-10 days. More severe cases may be treated at a hospital. Treatment may include:  Not eating or drinking.  Taking prescription pain medicine.  Receiving antibiotic medicines through an IV tube.  Receiving fluids and nutrition through an IV tube.  Surgery. HOME CARE INSTRUCTIONS   Follow your health care provider's instructions carefully.  Follow a full liquid diet or other diet as directed by your health care provider. After your symptoms improve, your health care provider may tell you to change your diet. He or she may recommend you eat a high-fiber diet. Fruits and vegetables are good sources of fiber. Fiber makes it easier to pass stool.  Take fiber supplements or probiotics as directed by your health care provider.  Only take medicines as directed by your health care provider.  Keep all your follow-up appointments. SEEK MEDICAL CARE IF:   Your pain does not improve.  You have a hard time eating food.  Your bowel movements do not return to normal. SEEK IMMEDIATE MEDICAL CARE IF:   Your pain becomes worse.  Your symptoms do not get better.  Your symptoms suddenly get worse.  You have a fever.  You have repeated vomiting.  You have bloody or black, tarry stools. MAKE SURE YOU:   Understand these instructions.  Will watch your condition.  Will get help right away if you are not doing well or get worse. Document Released: 07/08/2005 Document Revised: 10/03/2013 Document Reviewed: 08/23/2013 [facility] Patient Information 2015 [place], [place]. This information is not intended to replace advice given to you by your  health care provider. Make sure you discuss any questions you have with your health care provider.    Abdominal Aortic Aneurysm  Blood pumps away from the heart through tubes (blood vessels)  called arteries. Aneurysms are weak or damaged places in the wall of an artery. It bulges out like a balloon. An abdominal aortic aneurysm happens in the main artery of the body (aorta). It can burst or tear, causing bleeding inside the body. This is an emergency. It needs treatment right away. CAUSES  The exact cause is unknown. Things that could cause this problem include:  Fat and other substances building up in the lining of a tube.  Swelling of the walls of a blood vessel.  Certain tissue diseases.  Belly (abdominal) trauma.  An infection in the main artery of the body. RISK FACTORS There are things that make it more likely for you to have an aneurysm. These include:  Being over the age of 75 years old.  Having high blood pressure (hypertension).  Being a male.  Being white.  Being very overweight (obese).  Having a family history of aneurysm.  Using tobacco products. PREVENTION To lessen your chance of getting this condition:  Stop smoking. Stop chewing tobacco.  Limit or avoid alcohol.  Keep your blood pressure, blood sugar, and cholesterol within normal limits.  Eat less salt.  Eat foods low in saturated fats and cholesterol. These are found in animal and whole dairy products.  Eat more fiber. Fiber is found in whole grains, vegetables, and fruits.  Keep a healthy weight.  Stay active and exercise often. SYMPTOMS Symptoms depend on the size of the aneurysm and how fast it grows. There may not be symptoms. If symptoms occur, they can include:  Pain (belly, side, lower back, or groin).  Feeling full after eating a small amount of food.  Feeling sick to your stomach (nauseous), throwing up (vomiting), or both.  Feeling a lump in your belly that feels like it is beating (pulsating).  Feeling like you will pass out (faint). TREATMENT   Medicine to control blood pressure and pain.  Imaging tests to see if the aneurysm gets bigger.  Surgery. MAKE  SURE YOU:   Understand these instructions.  Will watch your condition.  Will get help right away if you are not doing well or get worse. Document Released: 01/23/2013 Document Reviewed: 01/23/2013 [facility] Patient Information 2015 [place]. This information is not intended to replace advice given to you by your health care provider. Make sure you discuss any questions you have with your health care provider.

## 2014-12-15 NOTE — ED Notes (Signed)
Pt done drinking contrast, called CT.

## 2014-12-15 NOTE — ED Provider Notes (Signed)
Care transferred from Dr. Jeneen Rinks on 74 year old with epigastric abdominal pain.  Patient states he is feeling improved.  Vital signs stable.  Repeat troponin is negative.  CT abdomen and pelvis with mild acute ileal diverticulitis.  He also has a mild aneurysmal dilatation of the infrarenal abdominal aorta with a max diameter of 3.2 cm which is increased from 2011 radiology recommending follow-up ultrasound in 3 years.  Do not feel that this is the cause of his pain and filled the acute diverticulitis likely culprit.  Patient given by mouth Cipro, Flagyl and Percocet, will be discharged home with same with instructions for close outpatient follow-up with Dr. Charlett Blake.    1. Diverticulitis of small intestine without perforation or abscess without bleeding   2. Abdominal pain   3. Epigastric pain   4. AAA (abdominal aortic aneurysm) without rupture      Ernestina Patches, MD 12/15/14 2108

## 2014-12-15 NOTE — ED Provider Notes (Signed)
CSN: [id number]     Arrival date & time 12/15/14  1409 History   First MD Initiated Contact with Patient 12/15/14 1437     Chief Complaint  Patient presents with  . Abdominal Pain      HPI  Patient presents evaluation of epigastric pain.  States that he started feeling poorly last night, after about 5pm.  States that he feels a tightness. It crosses upper epigastrium. Really minimal associated symptoms. He is nonspecific about his activity during the onset of the pain. It was after dinner and not frank associated with any food. He has not had difficulty eating with nausea vomiting or diarrhea. He says normal bowel habits. Does not feel short of breath anymore than his baseline. He states he awakened during the night at one point and was concerned about going back to sleep because he was so uncomfortable. His pain is persisted today and he presents here.   This has history of hypertension diabetes known heart disease. Most recent PTCA was August 2014. Has never infarcted. His episodes of pain in the past he has had nausea and shortness of breath.  My sense is intermittent trouble with his bowels over the last few years and drinking from diarrhea to constipation. However he had 2 normal bowel movements last night, and again this morning. Good appetite. No urinary symptoms.   Past Medical History  Diagnosis Date  . Obesity   . Edema   . DM (diabetes mellitus)   . Fatigue   . Gout   . Skin cancer   . CAD (coronary artery disease)     a. Stent 2000. b. Several caths since then with nonobst dz. c. 05/2013: IVUS-guided PTCA/DES to proximal-to-mid LAD, residual PDA disease for med rx unless recurrent angina. EF 55-60%.  . Ventral hernia   . Sinusitis   . Back problem   . Hyperlipidemia   . HTN (hypertension)   . Ejection fraction     55%, 07/2010, mild inferior hypo  . SOB (shortness of breath)   . Dizziness     Mild positional dizziness May, 2012  . History of pneumonia   . Melanoma       Melanoma removed from the left forearm with wide excision May, 2012  . Pneumonia     hx x4 young  . Sleep apnea     CPAP,  [name] since 2000  . GERD (gastroesophageal reflux disease)   . H/O hiatal hernia   . Arthritis   . Spinal stenosis     a. s/p surgical repair 2013  . Positive D-dimer     a. significant elevation ,hospital 07/2010, etiology unclear. b. D Dimer chronically > 20.  Marland Kitchen CKD (chronic kidney disease)      CKD stage III.. creatinine up to 1.5 in hospital after dye in October, 2011  . Nephrolithiasis    Past Surgical History  Procedure Laterality Date  . Nose surgery    . Cardiac catheterization    . Coronary stent placement  2000    CAD  . Knee arthroscopy  90's    rt  . Cervical disc surgery  90's  . Lumbar laminectomy/decompression microdiscectomy  08/30/2012    Procedure: LUMBAR LAMINECTOMY/DECOMPRESSION MICRODISCECTOMY 2 LEVELS;  Surgeon: [name], MD;  Location: [place] NEURO ORS;  Service: Neurosurgery;  Laterality: Bilateral;  Bilateral Lumbar three-four Lumbar four-five Laminotomies  . Back surgery    . Coronary angioplasty with stent placement  07/26/2013    DES to RCA  extending to PDA    . Left heart catheterization with coronary angiogram N/A 05/19/2013    Procedure: LEFT HEART CATHETERIZATION WITH CORONARY ANGIOGRAM;  Surgeon: [name], MD;  Location: [facility] CATH LAB;  Service: Cardiovascular;  Laterality: N/A;  . Percutaneous coronary stent intervention (pci-s)  05/19/2013    Procedure: PERCUTANEOUS CORONARY STENT INTERVENTION (PCI-S);  Surgeon: [name], MD;  Location: [facility] CATH LAB;  Service: Cardiovascular;;  . Left and right heart catheterization with coronary angiogram N/A 07/26/2013    Procedure: LEFT AND RIGHT HEART CATHETERIZATION WITH CORONARY ANGIOGRAM;  Surgeon: [name], MD;  Location: [place] CATH LAB;  Service: Cardiovascular;  Laterality: N/A;   Family History  Problem Relation Age of Onset  . Prostate cancer    . Kidney  cancer    . Cancer      Bladder cancer  . Coronary artery disease    . Cancer Mother     intestinal   . Heart disease Mother   . Cancer Father     prostate  . Migraines Daughter   . Leukemia Sister   . Heart disease Sister    History  Substance Use Topics  . Smoking status: Former Smoker -- 3.00 packs/day for 30 years    Types: Cigarettes    Quit date: 09/17/1996  . Smokeless tobacco: Never Used  . Alcohol Use: No    Review of Systems  Constitutional: Negative for fever, chills, diaphoresis, appetite change and fatigue.  HENT: Negative for mouth sores, sore throat and trouble swallowing.   Eyes: Negative for visual disturbance.  Respiratory: Negative for cough, chest tightness, shortness of breath and wheezing.   Cardiovascular: Negative for chest pain.  Gastrointestinal: Negative for nausea, vomiting, abdominal pain, diarrhea and abdominal distention.       Epigastric pain.   Endocrine: Negative for polydipsia, polyphagia and polyuria.  Genitourinary: Negative for dysuria, frequency and hematuria.  Musculoskeletal: Negative for gait problem.  Skin: Negative for color change, pallor and rash.  Neurological: Negative for dizziness, syncope, light-headedness and headaches.  Hematological: Does not bruise/bleed easily.  Psychiatric/Behavioral: Negative for behavioral problems and confusion.      Allergies  Levemir; Codeine; Lipitor; and Novolog mix  Home Medications   Prior to Admission medications   Medication Sig Start Date End Date Taking? Authorizing Provider  acetaminophen (TYLENOL) 500 MG tablet Take 1,000 mg by mouth 2 (two) times daily.   Yes Historical Provider, MD  allopurinol (ZYLOPRIM) 300 MG tablet Take 1 tablet (300 mg total) by mouth daily. 02/27/14  Yes [name], MD  aspirin EC 81 MG tablet Take 81 mg by mouth daily. 02/23/11  Yes [name], MD  atorvastatin (LIPITOR) 40 MG tablet Take 1 tablet (40 mg total) by mouth daily. 11/08/14  Yes [name], MD  Blood Glucose Monitoring Suppl (BLOOD GLUCOSE METER) kit Use as instructed 12/20/13  Yes [name], MD  Cholecalciferol (VITAMIN D) 1000 UNITS capsule Take 1,000 Units by mouth at bedtime.    Yes Historical Provider, MD  clopidogrel (PLAVIX) 75 MG tablet Take 1 tablet (75 mg total) by mouth daily with breakfast. 07/05/14  Yes [name], MD  fenofibrate 160 MG tablet Take 1 tablet (160 mg total) by mouth daily. 07/05/14  Yes [name], MD  fluocinonide cream (LIDEX) 9.45 % Apply 1 application topically daily as needed. FOR RASH 08/11/12  Yes Historical Provider, MD  fluticasone (FLONASE) 50 MCG/ACT nasal spray Place 2 sprays into  both nostrils daily. 10/10/14  Yes Meriam Sprague Saguier, PA-C  furosemide (LASIX) 80 MG tablet Take 1 tablet (80 mg total) by mouth daily. 07/05/14  Yes [name], MD  Insulin Isophane & Regular (HUMULIN 70/30 KWIKPEN) (70-30) 100 UNIT/ML SUPN Inject 30-35 Units into the skin 2 (two) times daily. Inject 35 units in the morning and 30 units in the evening. Patient taking differently: Inject into the skin 2 (two) times daily. Inject 40 units in the morning and 15 units at lunch  30 units in the evening. 08/01/13  Yes [name], MD  insulin NPH-regular Human (NOVOLIN 70/30) (70-30) 100 UNIT/ML injection Inject 40 units before breakfast, 15 units before lunch and 30 units before dinner. 12/15/13  Yes [name], MD  isosorbide mononitrate (IMDUR) 30 MG 24 hr tablet Take 1 tablet (30 mg total) by mouth daily. 07/05/14  Yes [name], MD  metoprolol (LOPRESSOR) 50 MG tablet Take 1 tablet (50 mg total) by mouth 2 (two) times daily. 07/05/14  Yes [name], MD  Omega-3 Fatty Acids (FISH OIL PO) Take by mouth daily.   Yes Historical Provider, MD  oxybutynin (DITROPAN) 5 MG tablet Take 1 tablet by mouth 2 (two) times daily. 10/24/14  Yes Historical Provider, MD  pantoprazole (PROTONIX) 20 MG tablet Take 20 mg by mouth daily.   Yes Historical  Provider, MD  tamsulosin (FLOMAX) 0.4 MG CAPS capsule Take 1 capsule (0.4 mg total) by mouth daily. 02/27/14  Yes [name], MD  TRUEPLUS LANCETS 28G MISC Use to test blood glucose 4 times daily as instructed. Dx code: 250.92 12/20/13  Yes [name], MD  TRUETEST TEST test strip TEST BLOOD SUGAR FOUR TIMES DAILY  AS  INSTRUCTED 10/19/14  Yes [name], MD  Insulin Pen Needle (PEN NEEDLES 31GX5/16") 31G X 8 MM MISC Use to inject insulin 2 times daily as instructed. 01/24/14   [name], MD  nitroGLYCERIN (NITROSTAT) 0.4 MG SL tablet Place 1 tablet (0.4 mg total) under the tongue every 5 (five) minutes as needed for chest pain. 07/05/14   [name], MD   BP 113/64 mmHg  Pulse 72  Temp(Src) 97.7 F (36.5 C) (Oral)  Resp 17  SpO2 97% Physical Exam  Constitutional: He is oriented to person, place, and time. He appears well-developed and well-nourished. No distress.  HENT:  Head: Normocephalic.  Eyes: Conjunctivae are normal. Pupils are equal, round, and reactive to light. No scleral icterus.  Neck: Normal range of motion. Neck supple. No thyromegaly present.  Cardiovascular: Normal rate and regular rhythm.  Exam reveals no gallop and no friction rub.   No murmur heard.   Pulmonary/Chest: Effort normal and breath sounds normal. No respiratory distress. He has no wheezes. He has no rales.  Abdominal: Soft. Bowel sounds are normal. He exhibits no distension. There is no tenderness. There is no rebound.    Musculoskeletal: Normal range of motion.  Neurological: He is alert and oriented to person, place, and time.  Skin: Skin is warm and dry. No rash noted.  Psychiatric: He has a normal mood and affect. His behavior is normal.    ED Course  Procedures (including critical care time) Labs Review Labs Reviewed  CBC WITH DIFFERENTIAL/PLATELET  COMPREHENSIVE METABOLIC PANEL  I-STAT TROPOININ, ED    Imaging Review Dg Chest 2 View  12/15/2014   CLINICAL DATA:   Midsternal chest pain and shortness of breath since last night. CHF. Cardiac stents. Hypertension. Diabetes.  EXAM:  CHEST  2 VIEW  COMPARISON:  05/18/2013  FINDINGS: Lateral view degraded by patient arm position. Midline trachea. Mild cardiomegaly. Small hiatal hernia suspected . No pleural effusion or pneumothorax. Mild biapical pleural thickening. Clear lungs.  IMPRESSION: Mild cardiomegaly, without congestive failure or acute disease   Electronically Signed   By: Abigail Miyamoto M.D.   On: 12/15/2014 15:48     EKG Interpretation   Date/Time:  Saturday December 15 2014 14:12:57 EST Ventricular Rate:  81 PR Interval:  176 QRS Duration: 94 QT Interval:  372 QTC Calculation: 432 R Axis:   -68 Text Interpretation:  Normal sinus rhythm Left anterior fascicular block  Abnormal ECG Confirmed by Jeneen Rinks  MD, [place] ([id number]) on 12/15/2014 3:02:28 PM      MDM   Final diagnoses:  Abdominal pain    Presents with almost 24 hours of symptoms, and normal troponin. EKG shows no changes. Minimally reproducible tenderness. However he has a large protuberant abdomen. No masses or pulsations. Plan is compressive profile, lipase, CT scan of the abdomen, repeat troponin, pain control reevaluation.  17:10:  Normal hepatobiliary enzymes. No leukocytosis. Normal first troponin. Chest x-ray shows no acute findings. On recheck after IV pain medication he states his pain is "much better". CT, lipase pending. Patient does not localize tenderness to the right upper quadrant. In review of his symptoms pain is not postprandial or colicky or suggestive of biliary colic.  With normal CT and normal delta troponin, theoretically, the patient could go home if symptoms remain well controlled.  Any abnormalities on CT her with lipase, or on  follow-up troponin we will address    Tanna Furry, MD 12/15/14 1714

## 2014-12-18 ENCOUNTER — Telehealth: Payer: Self-pay | Admitting: Internal Medicine

## 2014-12-18 ENCOUNTER — Other Ambulatory Visit: Payer: Self-pay | Admitting: Cardiology

## 2014-12-18 ENCOUNTER — Ambulatory Visit: Payer: Commercial Managed Care - HMO | Admitting: Physician Assistant

## 2014-12-18 NOTE — Telephone Encounter (Signed)
Please read message below and advise.  

## 2014-12-18 NOTE — Telephone Encounter (Signed)
Patient wife called stating that William Hendrix was seen in the ER 3.05.16 Want to know if follow is needed ? Also they will be taking a flight and need a letter to take insulin   Please advise   Thank you

## 2014-12-18 NOTE — Telephone Encounter (Signed)
I have not seen him in >1 year. Refills and letter per PCP. If he decides to return, I need to see him first before refilling insulin.

## 2014-12-19 NOTE — Telephone Encounter (Signed)
**Note De-Identified Moriah Shawley Obfuscation** Please refer to the pts PCP as Dr Ron Parker refilled last RX as a courtesy to the pt. Thanks.

## 2014-12-19 NOTE — Telephone Encounter (Signed)
Noted, patient is aware. 

## 2014-12-24 ENCOUNTER — Encounter: Payer: Self-pay | Admitting: *Deleted

## 2014-12-24 ENCOUNTER — Other Ambulatory Visit: Payer: Self-pay | Admitting: *Deleted

## 2014-12-24 ENCOUNTER — Encounter: Payer: Self-pay | Admitting: Internal Medicine

## 2014-12-24 ENCOUNTER — Ambulatory Visit (INDEPENDENT_AMBULATORY_CARE_PROVIDER_SITE_OTHER): Payer: Commercial Managed Care - HMO | Admitting: Internal Medicine

## 2014-12-24 VITALS — BP 128/62 | HR 79 | Temp 97.8°F | Resp 12 | Wt 257.8 lb

## 2014-12-24 DIAGNOSIS — E1159 Type 2 diabetes mellitus with other circulatory complications: Secondary | ICD-10-CM | POA: Diagnosis not present

## 2014-12-24 MED ORDER — INSULIN NPH (HUMAN) (ISOPHANE) 100 UNIT/ML ~~LOC~~ SUSP: SUBCUTANEOUS | Status: DC

## 2014-12-24 MED ORDER — "BD INSULIN SYRINGE ULTRAFINE 31G X 5/16"" 1 ML MISC": Status: DC

## 2014-12-24 MED ORDER — GLUCOSE BLOOD VI STRP: ORAL_STRIP | Status: DC

## 2014-12-24 MED ORDER — INSULIN REGULAR HUMAN 100 UNIT/ML IJ SOLN: 15.0000 [IU] | Freq: Three times a day (TID) | INTRAMUSCULAR | Status: DC

## 2014-12-24 MED ORDER — "PEN NEEDLES 5/16"" 31G X 8 MM MISC": Status: DC

## 2014-12-24 NOTE — Progress Notes (Signed)
Patient ID: William Hendrix, male   DOB: November 11, 1940, 74 y.o.   MRN: [id number]  HPI: William Hendrix is a 74 y.o.-year-old male, returning for f/u for DM2, dx 2009, insulin-dependent, uncontrolled, with complications (CAD, CKD stage 3). He is here with his wife who offers part of the history. Last visit 1 year and 2 mo ago!  He had URI >> was on 2 courses of ABx. He also had diverticulitis at the beginning of this month >> now on ABx.  She was dx'ed with an abd. aortic aneurism.  Last hemoglobin A1c was: Lab Results  Component Value Date   HGBA1C 10.5* 11/05/2014   HGBA1C 9.5* 08/02/2014   HGBA1C 6.9* 08/01/2013   Pt is on a regimen of Relion Novolin 70/30:  - 40 units before breakfast - 15 units before lunch - 30 units before dinner He gets 1 vial for 28$ - not run through insurance.  They could not afford other insulin. Has [employer] now. We cannot use Metformin 2/2 CKD. We stopped Januvia at last visit >> could not afford ($100/3 mo)  We cannot use Levemir >> developed a severe skin reaction to Levemir  We stopped Amaryl 4 mg.  He brings his log. He checks 3-4 times a day, and we reviewed his log together - high sugars.  - am: 169-228 >> 151-192 >> 145-176  >> 200-280 >> 152-220 - Before lunch 132-295 >> 137-220 >> 85-147 >> 219-292 >> 189-260 - before dinner 124-194 >> 95-200 >> 89-175 >> 191-378 >> 89, 156-299, 394 - Before bedtime, highest sugars at that day, 175-240 >> 157-179 >> 102 >> n/c No lows. Lowest sugar was 150 >> 89,  he does not know whether he has hypoglycemia awareness. Highest sugar was 300s  Pt has chronic kidney disease, last BUN/creatinine was:  Lab Results  Component Value Date   BUN 20 12/15/2014   CREATININE 1.37* 12/15/2014  ACR 0.7. Last lipids: Lab Results  Component Value Date   CHOL 165 11/05/2014   HDL 23.20* 11/05/2014   LDLCALC 31 04/09/2014   LDLDIRECT 73.0 11/05/2014   TRIG 492.0* 11/05/2014   CHOLHDL 7 11/05/2014   Pt's last eye exam  was 3 years ago. No DR. Had cataract sx 5-6 years ago.  Denies numbness and tingling in his legs.  PMH: Pt also also has a history of CAD, sees Dr. Ron Parker, hypertension, hyperlipidemia, chronic kidney disease stage III, history of nephrolithiasis, obesity, hiatal hernia, obstructive sleep apnea, hyperglyceridemia, melanoma, spinal stenosis.  ROS: Constitutional: no  weight gain, no fatigue, no subjective hyperthermia/hypothermia Eyes: no blurry vision, no xerophthalmia ENT: no sore throat, no nodules palpated in throat, no dysphagia/odynophagia, no hoarseness Cardiovascular: no CP/SOB/palpitations/+ leg swelling Respiratory: no cough/SOB Gastrointestinal: no N/V/D/C Musculoskeletal: no muscle/joint aches Skin: no rashes  I reviewed pt's medications, allergies, PMH, social hx, family hx and no changes required.  PE: BP 128/62 mmHg  Pulse 79  Temp(Src) 97.8 F (36.6 C) (Oral)  Resp 12  Wt 257 lb 12.8 oz (116.937 kg)  SpO2 95% Wt Readings from Last 3 Encounters:  12/24/14 257 lb 12.8 oz (116.937 kg)  11/05/14 254 lb 3.2 oz (115.304 kg)  10/10/14 255 lb (115.667 kg)  Constitutional: overweight, in NAD Eyes: PERRLA, EOMI, no exophthalmos ENT: moist mucous membranes, no thyromegaly, no cervical lymphadenopathy Cardiovascular: RRR, No MRG Respiratory: CTA B Gastrointestinal: abdomen soft, NT, ND, BS+ Musculoskeletal: no deformities, strength intact in all 4 Skin: moist, warm, no rashes  ASSESSMENT: 1. DM2, insulin-dependent,  uncontrolled, with complications - CAD, s/p stents - Dr. Ron Parker.  He had stenting of distal RCA/PDA in 07/2013. Prior stenting of LAD in 04/2013. - CKD stage 3  PLAN:  1. Patient with deteriorating diabetes control, I advised them to stop the 70/30 insulin and start N and R.   Patient Instructions   Please stop the 70/30 insulin.  Start ReliOn insulin:  Insulin Before breakfast Before lunch Before dinner  Regular 15 - small meal 18 - large meal 15 -  small meal 18 - large meal 15 - small meal 18 - large meal  NPH 35  35   Please inject the insulin 30 min before meals.  Please call me with sugars in 1-2 weeks.  - will check a HbA1c at next visit - filled insulins - given letter for insulin transport on the plane - I will see the patient back in 1.5 mo with his sugar log

## 2014-12-24 NOTE — Patient Instructions (Signed)
Please stop the 70/30 insulin.  Start ReliOn insulin:  Insulin Before breakfast Before lunch Before dinner  Regular 15 - small meal 18 - large meal 15 - small meal 18 - large meal 15 - small meal 18 - large meal  NPH 35  35   Please inject the insulin 30 min before meals.  Please call me with sugars in 1-2 weeks.

## 2015-01-01 ENCOUNTER — Encounter: Payer: Self-pay | Admitting: Cardiology

## 2015-01-01 DIAGNOSIS — I714 Abdominal aortic aneurysm, without rupture, unspecified: Secondary | ICD-10-CM | POA: Insufficient documentation

## 2015-01-02 ENCOUNTER — Ambulatory Visit (INDEPENDENT_AMBULATORY_CARE_PROVIDER_SITE_OTHER): Payer: Commercial Managed Care - HMO | Admitting: Cardiology

## 2015-01-02 ENCOUNTER — Telehealth: Payer: Self-pay | Admitting: Family Medicine

## 2015-01-02 ENCOUNTER — Encounter: Payer: Self-pay | Admitting: Cardiology

## 2015-01-02 VITALS — BP 108/80 | HR 62 | Ht 70.5 in | Wt 258.0 lb

## 2015-01-02 DIAGNOSIS — R0602 Shortness of breath: Secondary | ICD-10-CM

## 2015-01-02 DIAGNOSIS — I714 Abdominal aortic aneurysm, without rupture, unspecified: Secondary | ICD-10-CM

## 2015-01-02 DIAGNOSIS — I2510 Atherosclerotic heart disease of native coronary artery without angina pectoris: Secondary | ICD-10-CM

## 2015-01-02 DIAGNOSIS — I10 Essential (primary) hypertension: Secondary | ICD-10-CM

## 2015-01-02 MED ORDER — ALLOPURINOL 300 MG PO TABS: 300.0000 mg | ORAL_TABLET | Freq: Every day | ORAL | Status: DC

## 2015-01-02 NOTE — Assessment & Plan Note (Signed)
He is not having any significant shortness of breath. No change in therapy.

## 2015-01-02 NOTE — Assessment & Plan Note (Signed)
The patient has a very small infrarenal abdominal aortic aneurysm documented by CT scan. The recommendation would be a follow-up ultrasound of the abdomen in 2-3 years.

## 2015-01-02 NOTE — Progress Notes (Signed)
Cardiology Office Note   Date:  01/02/2015   ID:  William Hendrix, DOB 11/13/1940, MRN [id number]  PCP:  Penni Homans, MD  Cardiologist:  Dola Argyle, MD   Chief Complaint  Patient presents with  . Appointment    Follow-up coronary artery disease      History of Present Illness: William Hendrix is a 74 y.o. male who presents today to follow up coronary disease. I saw him last in the office September, 2015. He was seen in the emergency room with abdominal pain on December 15, 2014. He had discomfort across the epigastrium. It was felt not to be cardiac in origin. Ultimately he was felt to have an episode of diverticulitis. He improved and is doing well since then. His CT of the abdomen did show mild dilatation of the abdominal aorta. There was slight aneurysmal dilatation. He will need follow-up ultrasound after probably 2 years. CT showed mild acute ileal diverticulitis. There was scattered diverticulitis along the distal ileum. It was also noted along the distal descending and proximal sigmoid colon. There was mild aneurysmal dilatation of the aorta measuring 3.2 cm. The recommendation is a ultrasound in 3 years. The patient and his wife are aware of this finding. I discussed this with them fully.  Fortunately he has not been having any of his chest pain. He is very excited about the fact that he and his wife will be going to [place] for a trip in the near future.    Past Medical History  Diagnosis Date  . Obesity   . Edema   . DM (diabetes mellitus)   . Fatigue   . Gout   . Skin cancer   . CAD (coronary artery disease)     a. Stent 2000. b. Several caths since then with nonobst dz. c. 05/2013: IVUS-guided PTCA/DES to proximal-to-mid LAD, residual PDA disease for med rx unless recurrent angina. EF 55-60%.  . Ventral hernia   . Sinusitis   . Back problem   . Hyperlipidemia   . HTN (hypertension)   . Ejection fraction     55%, 07/2010, mild inferior hypo  . SOB (shortness of  breath)   . Dizziness     Mild positional dizziness May, 2012  . History of pneumonia   . Melanoma     Melanoma removed from the left forearm with wide excision May, 2012  . Pneumonia     hx x4 young  . Sleep apnea     CPAP,  [name] since 2000  . GERD (gastroesophageal reflux disease)   . H/O hiatal hernia   . Arthritis   . Spinal stenosis     a. s/p surgical repair 2013  . Positive D-dimer     a. significant elevation ,hospital 07/2010, etiology unclear. b. D Dimer chronically > 20.  Marland Kitchen CKD (chronic kidney disease)      CKD stage III.. creatinine up to 1.5 in hospital after dye in October, 2011  . Nephrolithiasis   . CHF (congestive heart failure)   . AAA (abdominal aortic aneurysm) 12-15-14    Mild aneurysmal dilatation of the infrarenal abdominal aorta 3.2 cm     Past Surgical History  Procedure Laterality Date  . Nose surgery    . Cardiac catheterization    . Coronary stent placement  2000    CAD  . Knee arthroscopy  90's    rt  . Cervical disc surgery  90's  . Lumbar laminectomy/decompression microdiscectomy  08/30/2012  Procedure: LUMBAR LAMINECTOMY/DECOMPRESSION MICRODISCECTOMY 2 LEVELS;  Surgeon: [name], MD;  Location: [facility];  Service: Neurosurgery;  Laterality: Bilateral;  Bilateral Lumbar three-four Lumbar four-five Laminotomies  . Back surgery    . Coronary angioplasty with stent placement  07/26/2013    DES to RCA extending to PDA    . Left heart catheterization with coronary angiogram N/A 05/19/2013    Procedure: LEFT HEART CATHETERIZATION WITH CORONARY ANGIOGRAM;  Surgeon: [name], MD;  Location: [facility] CATH LAB;  Service: Cardiovascular;  Laterality: N/A;  . Percutaneous coronary stent intervention (pci-s)  05/19/2013    Procedure: PERCUTANEOUS CORONARY STENT INTERVENTION (PCI-S);  Surgeon: [name], MD;  Location: [facility] CATH LAB;  Service: Cardiovascular;;  . Left and right heart catheterization with coronary angiogram N/A 07/26/2013     Procedure: LEFT AND RIGHT HEART CATHETERIZATION WITH CORONARY ANGIOGRAM;  Surgeon: [name], MD;  Location: [facility] CATH LAB;  Service: Cardiovascular;  Laterality: N/A;    Patient Active Problem List   Diagnosis Date Noted  . Dizziness     Priority: High  . CAD (coronary artery disease)     Priority: High  . Ejection fraction     Priority: High  . SOB (shortness of breath)     Priority: High  . AAA (abdominal aortic aneurysm) 01/01/2015  . Type 2 diabetes mellitus with other circulatory complications 84/78/4128  . Sinusitis 10/10/2014  . Hip pain, bilateral 07/05/2014  . Palpitations 02/27/2014  . Claudication of lower extremity 10/25/2013  . BPH with obstruction/lower urinary tract symptoms 10/25/2013  . Hypertriglyceridemia 08/15/2012  . Spinal stenosis of lumbar region at multiple levels 08/10/2012  . Melanoma   . Renal insufficiency   . Hyperlipidemia   . HTN (hypertension)   . OSA (obstructive sleep apnea)   . PARESTHESIA 05/30/2009  . Overweight 01/22/2009  . EDEMA 01/22/2009  . Diabetes mellitus type 2 in obese 04/25/2008  . OTHER ABNORMAL BLOOD CHEMISTRY 04/25/2008  . Gout 10/21/2007  . DEPRESSION 10/21/2007  . VENTRAL HERNIA 10/21/2007  . HIATAL HERNIA 10/21/2007  . FATIGUE 10/21/2007  . SKIN CANCER, HX OF 10/21/2007  . NEPHROLITHIASIS, HX OF 10/21/2007      Current Outpatient Prescriptions  Medication Sig Dispense Refill  . acetaminophen (TYLENOL) 500 MG tablet Take 1,000 mg by mouth 2 (two) times daily.    Marland Kitchen allopurinol (ZYLOPRIM) 300 MG tablet Take 1 tablet (300 mg total) by mouth daily. 90 tablet 3  . aspirin EC 81 MG tablet Take 81 mg by mouth daily.    Marland Kitchen atorvastatin (LIPITOR) 40 MG tablet Take 1 tablet (40 mg total) by mouth daily. 30 tablet 3  . BD INSULIN SYRINGE ULTRAFINE 31G X 5/16" 1 ML MISC Use to inject insulin 5 times daily as instructed. Dx: E11.9 450 each 3  . Blood Glucose Monitoring Suppl (BLOOD GLUCOSE METER) kit Use as instructed 1  each 0  . Cholecalciferol (VITAMIN D) 1000 UNITS capsule Take 1,000 Units by mouth at bedtime.     . ciprofloxacin (CIPRO) 500 MG tablet Take 1 tablet (500 mg total) by mouth 2 (two) times daily. One po bid x 7 days (Patient not taking: Reported on 01/02/2015) 20 tablet 0  . clopidogrel (PLAVIX) 75 MG tablet Take 1 tablet (75 mg total) by mouth daily with breakfast. 90 tablet 3  . fenofibrate 160 MG tablet Take 1 tablet (160 mg total) by mouth daily. 90 tablet 3  . fluocinonide cream (LIDEX) 2.08 % Apply 1 application  topically daily as needed. FOR RASH    . fluticasone (FLONASE) 50 MCG/ACT nasal spray Place 2 sprays into both nostrils daily. (Patient taking differently: Place 2 sprays into both nostrils as needed. ) 16 g 1  . furosemide (LASIX) 80 MG tablet Take 1 tablet (80 mg total) by mouth daily. 90 tablet 3  . glucose blood (TRUETEST TEST) test strip Use to test blood sugar 3 times daily as instructed. Dx code: E11.9 275 each 3  . HYDROcodone-acetaminophen (NORCO/VICODIN) 5-325 MG per tablet Take 1 tablet by mouth every 4 (four) hours as needed. (Patient not taking: Reported on 01/02/2015) 6 tablet 0  . insulin NPH Human (NOVOLIN N) 100 UNIT/ML injection Inject 35 units 2x a day 20 mL 2  . Insulin Pen Needle (PEN NEEDLES 31GX5/16") 31G X 8 MM MISC Use to inject insulin 2 times daily as instructed. 200 each 2  . insulin regular (NOVOLIN R RELION) 100 units/mL injection Inject 0.15-0.18 mLs (15-18 Units total) into the skin 3 (three) times daily before meals. 20 mL 2  . isosorbide mononitrate (IMDUR) 30 MG 24 hr tablet Take 1 tablet (30 mg total) by mouth daily. 90 tablet 3  . metoprolol (LOPRESSOR) 50 MG tablet Take 1 tablet (50 mg total) by mouth 2 (two) times daily. 180 tablet 3  . metroNIDAZOLE (FLAGYL) 500 MG tablet Take 1 tablet (500 mg total) by mouth 2 (two) times daily. One po bid x 7 days 20 tablet 0  . nitroGLYCERIN (NITROSTAT) 0.4 MG SL tablet Place 1 tablet (0.4 mg total) under the  tongue every 5 (five) minutes as needed for chest pain. 25 tablet 3  . Omega-3 Fatty Acids (FISH OIL PO) Take by mouth daily.    Marland Kitchen oxybutynin (DITROPAN) 5 MG tablet Take 1 tablet by mouth 2 (two) times daily.    Marland Kitchen oxyCODONE-acetaminophen (PERCOCET) 5-325 MG per tablet Take 1 tablet by mouth every 6 (six) hours as needed. (Patient not taking: Reported on 01/02/2015) 20 tablet 0  . pantoprazole (PROTONIX) 20 MG tablet Take 20 mg by mouth daily.    . tamsulosin (FLOMAX) 0.4 MG CAPS capsule Take 1 capsule (0.4 mg total) by mouth daily. 90 capsule 3  . TRUEPLUS LANCETS 28G MISC Use to test blood glucose 4 times daily as instructed. Dx code: 250.92 400 each 2   No current facility-administered medications for this visit.    Allergies:   Levemir; Codeine; Lipitor; and Novolog mix    Social History:  The patient  reports that he quit smoking about 18 years ago. His smoking use included Cigarettes. He has a 90 pack-year smoking history. He has never used smokeless tobacco. He reports that he does not drink alcohol or use illicit drugs.   Family History:  The patient's family history includes Cancer in his father, mother, and another family member; Coronary artery disease in an other family member; Heart disease in his mother and sister; Kidney cancer in an other family member; Leukemia in his sister; Migraines in his daughter; Prostate cancer in an other family member.    ROS:  Please see the history of present illness.     Patient denies fever, chills, headache, sweats, rash, change in vision, change in hearing, chest pain, cough, nausea or vomiting, urinary symptoms. All other systems are reviewed and are negative.   PHYSICAL EXAM: VS:  BP 108/80 mmHg  Pulse 62  Ht 5' 10.5" (1.791 m)  Wt 258 lb (117.028 kg)  BMI 36.48 kg/m2 , Patient  is oriented to person time and place. Affect is normal. He is here with his wife. Head is atraumatic. Sclera and conjunctiva are normal. There is no jugular venous  distention. Lungs are clear. Respiratory effort is nonlabored. Cardiac exam reveals an S1 and S2. The abdomen is protuberant as always. There is no discomfort. He has no significant peripheral edema. He is overweight but this is stable. There are no musculoskeletal deformities. There are no skin rashes. Neurologic is grossly intact.  EKG:   EKG is not done today.   Recent Labs: 11/05/2014: TSH 3.70 12/15/2014: ALT 25; BUN 20; Creatinine 1.37*; Hemoglobin 14.3; Platelets 180; Potassium 3.7; Sodium 138    Lipid Panel    Component Value Date/Time   CHOL 165 11/05/2014 1706   TRIG 492.0* 11/05/2014 1706   TRIG 203* 09/17/2006 0812   HDL 23.20* 11/05/2014 1706   CHOLHDL 7 11/05/2014 1706   CHOLHDL 5.7 CALC 09/17/2006 0812   VLDL 66.0* 08/02/2014 1049   LDLCALC 31 04/09/2014 0858   LDLDIRECT 73.0 11/05/2014 1706   LDLDIRECT 95.3 09/17/2006 0812      Wt Readings from Last 3 Encounters:  01/02/15 258 lb (117.028 kg)  12/24/14 257 lb 12.8 oz (116.937 kg)  11/05/14 254 lb 3.2 oz (115.304 kg)      Current medicines are reviewed  The patient understands his medications.     ASSESSMENT AND PLAN:

## 2015-01-02 NOTE — Telephone Encounter (Signed)
Caller name: Dr. Ron Parker office  Relation to pt: [facility]  Call back number: [phone]  Pharmacy:  Pharmacy:  [place], [address]         Reason for call:  Dr. Ron Parker is retiring and requesting PCP to refill allopurinol (ZYLOPRIM) 300 MG tablet. Please advise

## 2015-01-02 NOTE — Patient Instructions (Signed)
Your physician recommends that you continue on your current medications as directed. Please refer to the Current Medication list given to you today.  Your physician wants you to follow-up in: 6 months. You will receive a reminder letter in the mail two months in advance. If you don't receive a letter, please call our office to schedule the follow-up appointment.  

## 2015-01-02 NOTE — Telephone Encounter (Signed)
Med filled.  

## 2015-01-02 NOTE — Assessment & Plan Note (Signed)
His coronary disease is stable. He received a stent to the distal RCA October, 2014. He has not had any significant recurring symptoms.

## 2015-01-02 NOTE — Assessment & Plan Note (Signed)
Blood pressure is controlled. No change in therapy. 

## 2015-01-07 ENCOUNTER — Other Ambulatory Visit: Payer: Self-pay | Admitting: Family Medicine

## 2015-01-28 ENCOUNTER — Encounter: Payer: Self-pay | Admitting: Family Medicine

## 2015-01-28 ENCOUNTER — Ambulatory Visit (INDEPENDENT_AMBULATORY_CARE_PROVIDER_SITE_OTHER): Payer: Commercial Managed Care - HMO | Admitting: Family Medicine

## 2015-01-28 VITALS — BP 122/72 | HR 72 | Temp 98.6°F | Ht 70.5 in | Wt 260.2 lb

## 2015-01-28 DIAGNOSIS — E1169 Type 2 diabetes mellitus with other specified complication: Secondary | ICD-10-CM

## 2015-01-28 DIAGNOSIS — E782 Mixed hyperlipidemia: Secondary | ICD-10-CM

## 2015-01-28 DIAGNOSIS — I10 Essential (primary) hypertension: Secondary | ICD-10-CM | POA: Diagnosis not present

## 2015-01-28 DIAGNOSIS — E119 Type 2 diabetes mellitus without complications: Secondary | ICD-10-CM | POA: Diagnosis not present

## 2015-01-28 DIAGNOSIS — Z8719 Personal history of other diseases of the digestive system: Secondary | ICD-10-CM

## 2015-01-28 DIAGNOSIS — E669 Obesity, unspecified: Secondary | ICD-10-CM

## 2015-01-28 DIAGNOSIS — G4733 Obstructive sleep apnea (adult) (pediatric): Secondary | ICD-10-CM

## 2015-01-28 DIAGNOSIS — N400 Enlarged prostate without lower urinary tract symptoms: Secondary | ICD-10-CM

## 2015-01-28 DIAGNOSIS — E663 Overweight: Secondary | ICD-10-CM

## 2015-01-28 DIAGNOSIS — E785 Hyperlipidemia, unspecified: Secondary | ICD-10-CM

## 2015-01-28 MED ORDER — FENOFIBRATE 160 MG PO TABS: 160.0000 mg | ORAL_TABLET | Freq: Every day | ORAL | Status: DC

## 2015-01-28 MED ORDER — METOPROLOL TARTRATE 50 MG PO TABS: 50.0000 mg | ORAL_TABLET | Freq: Two times a day (BID) | ORAL | Status: DC

## 2015-01-28 MED ORDER — FUROSEMIDE 80 MG PO TABS: 80.0000 mg | ORAL_TABLET | Freq: Every day | ORAL | Status: DC

## 2015-01-28 MED ORDER — TAMSULOSIN HCL 0.4 MG PO CAPS: 0.4000 mg | ORAL_CAPSULE | Freq: Every day | ORAL | Status: DC

## 2015-01-28 NOTE — Progress Notes (Signed)
Pre visit review using our clinic review tool, if applicable. No additional management support is needed unless otherwise documented below in the visit note. 

## 2015-01-28 NOTE — Assessment & Plan Note (Signed)
Well controlled, no changes to meds. Encouraged heart healthy diet such as the DASH diet and exercise as tolerated.  °

## 2015-01-28 NOTE — Patient Instructions (Signed)

## 2015-01-28 NOTE — Progress Notes (Signed)
William Hendrix  [id number] 1941/05/25 01/28/2015      Progress Note-Follow Up  Subjective  Chief Complaint  Chief Complaint  Patient presents with  . Follow-up    3 month    HPI  Patient is a 74 y.o. male in today for routine medical care. Patient is in today for follow-up and unfortunately recently had his first episode of diverticulitis. He responded to ciprofloxacin and Flagyl but still has some mild abdominal discomfort at times. He denies nausea vomiting or fevers. His bowels are moving normally at this time. His blood sugars have been off slightly. He is generally got numbers in the 200s although he does occasionally see the 100s and 300s. Denies CP/palp/SOB/HA/congestion/fevers/GI or GU c/o. Taking meds as prescribed  Past Medical History  Diagnosis Date  . Obesity   . Edema   . DM (diabetes mellitus)   . Fatigue   . Gout   . Skin cancer   . CAD (coronary artery disease)     a. Stent 2000. b. Several caths since then with nonobst dz. c. 05/2013: IVUS-guided PTCA/DES to proximal-to-mid LAD, residual PDA disease for med rx unless recurrent angina. EF 55-60%.  . Ventral hernia   . Sinusitis   . Back problem   . Hyperlipidemia   . HTN (hypertension)   . Ejection fraction     55%, 07/2010, mild inferior hypo  . SOB (shortness of breath)   . Dizziness     Mild positional dizziness May, 2012  . History of pneumonia   . Melanoma     Melanoma removed from the left forearm with wide excision May, 2012  . Pneumonia     hx x4 young  . Sleep apnea     CPAP,  [name] since 2000  . GERD (gastroesophageal reflux disease)   . H/O hiatal hernia   . Arthritis   . Spinal stenosis     a. s/p surgical repair 2013  . Positive D-dimer     a. significant elevation ,hospital 07/2010, etiology unclear. b. D Dimer chronically > 20.  Marland Kitchen CKD (chronic kidney disease)      CKD stage III.. creatinine up to 1.5 in hospital after dye in October, 2011  . Nephrolithiasis   . CHF  (congestive heart failure)   . AAA (abdominal aortic aneurysm) 12-15-14    Mild aneurysmal dilatation of the infrarenal abdominal aorta 3.2 cm     Past Surgical History  Procedure Laterality Date  . Nose surgery    . Cardiac catheterization    . Coronary stent placement  2000    CAD  . Knee arthroscopy  90's    rt  . Cervical disc surgery  90's  . Lumbar laminectomy/decompression microdiscectomy  08/30/2012    Procedure: LUMBAR LAMINECTOMY/DECOMPRESSION MICRODISCECTOMY 2 LEVELS;  Surgeon: [name], MD;  Location: [facility];  Service: Neurosurgery;  Laterality: Bilateral;  Bilateral Lumbar three-four Lumbar four-five Laminotomies  . Back surgery    . Coronary angioplasty with stent placement  07/26/2013    DES to RCA extending to PDA    . Left heart catheterization with coronary angiogram N/A 05/19/2013    Procedure: LEFT HEART CATHETERIZATION WITH CORONARY ANGIOGRAM;  Surgeon: [name], MD;  Location: [facility] CATH LAB;  Service: Cardiovascular;  Laterality: N/A;  . Percutaneous coronary stent intervention (pci-s)  05/19/2013    Procedure: PERCUTANEOUS CORONARY STENT INTERVENTION (PCI-S);  Surgeon: [name], MD;  Location: [facility] CATH LAB;  Service: Cardiovascular;;  .  Left and right heart catheterization with coronary angiogram N/A 07/26/2013    Procedure: LEFT AND RIGHT HEART CATHETERIZATION WITH CORONARY ANGIOGRAM;  Surgeon: [name], MD;  Location: [facility] [place] CATH LAB;  Service: Cardiovascular;  Laterality: N/A;    Family History  Problem Relation Age of Onset  . Prostate cancer    . Kidney cancer    . Cancer      Bladder cancer  . Coronary artery disease    . Cancer Mother     intestinal   . Heart disease Mother   . Cancer Father     prostate  . Migraines Daughter   . Leukemia Sister   . Heart disease Sister     History   Social History  . Marital Status: Married    Spouse Name: N/A  . Number of Children: N/A  . Years of Education: N/A   Occupational  History  . [employer]    Social History Main Topics  . Smoking status: Former Smoker -- 3.00 packs/day for 30 years    Types: Cigarettes    Quit date: 09/17/1996  . Smokeless tobacco: Never Used  . Alcohol Use: No  . Drug Use: No  . Sexual Activity: Not Currently    Birth Control/ Protection: None   Other Topics Concern  . Not on file   Social History Narrative   Married and lives locally with his wife.  [employer].    Current Outpatient Prescriptions on File Prior to Visit  Medication Sig Dispense Refill  . acetaminophen (TYLENOL) 500 MG tablet Take 1,000 mg by mouth 2 (two) times daily.    Marland Kitchen allopurinol (ZYLOPRIM) 300 MG tablet Take 1 tablet (300 mg total) by mouth daily. 90 tablet 1  . aspirin EC 81 MG tablet Take 81 mg by mouth daily.    Marland Kitchen atorvastatin (LIPITOR) 40 MG tablet TAKE 1 TABLET EVERY DAY 90 tablet 0  . BD INSULIN SYRINGE ULTRAFINE 31G X 5/16" 1 ML MISC Use to inject insulin 5 times daily as instructed. Dx: E11.9 450 each 3  . Blood Glucose Monitoring Suppl (BLOOD GLUCOSE METER) kit Use as instructed 1 each 0  . Cholecalciferol (VITAMIN D) 1000 UNITS capsule Take 1,000 Units by mouth at bedtime.     . clopidogrel (PLAVIX) 75 MG tablet Take 1 tablet (75 mg total) by mouth daily with breakfast. 90 tablet 3  . fluocinonide cream (LIDEX) 5.46 % Apply 1 application topically daily as needed. FOR RASH    . fluticasone (FLONASE) 50 MCG/ACT nasal spray Place 2 sprays into both nostrils daily. (Patient taking differently: Place 2 sprays into both nostrils as needed. ) 16 g 1  . glucose blood (TRUETEST TEST) test strip Use to test blood sugar 3 times daily as instructed. Dx code: E11.9 275 each 3  . insulin NPH Human (NOVOLIN N) 100 UNIT/ML injection Inject 35 units 2x a day 20 mL 2  . Insulin Pen Needle (PEN NEEDLES 31GX5/16") 31G X 8 MM MISC Use to inject insulin 2 times daily as instructed. 200 each 2  . insulin regular (NOVOLIN R RELION) 100 units/mL injection Inject 0.15-0.18  mLs (15-18 Units total) into the skin 3 (three) times daily before meals. 20 mL 2  . isosorbide mononitrate (IMDUR) 30 MG 24 hr tablet Take 1 tablet (30 mg total) by mouth daily. 90 tablet 3  . nitroGLYCERIN (NITROSTAT) 0.4 MG SL tablet Place 1 tablet (0.4 mg total) under the tongue every 5 (five) minutes as needed for chest  pain. 25 tablet 3  . Omega-3 Fatty Acids (FISH OIL PO) Take by mouth daily.    Marland Kitchen oxybutynin (DITROPAN) 5 MG tablet Take 1 tablet by mouth 2 (two) times daily.    . pantoprazole (PROTONIX) 20 MG tablet Take 20 mg by mouth daily.    . TRUEPLUS LANCETS 28G MISC Use to test blood glucose 4 times daily as instructed. Dx code: 250.92 400 each 2   No current facility-administered medications on file prior to visit.    Allergies  Allergen Reactions  . Levemir [Insulin Detemir] Swelling    Patient had redness and swelling and tenderness at injection site.  . Codeine Other (See Comments)    Makes him "shakey", is OK with hydrocodone GI UPSET & TREMORS  . Lipitor [Atorvastatin] Other (See Comments)    Muscle aches; liver functions. Patient is currently taken  . Novolog Mix [Insulin Aspart Prot & Aspart]     Causes skin to swell/itch at injection site    Review of Systems  Review of Systems  Constitutional: Negative for fever, chills and malaise/fatigue.  HENT: Negative for congestion, hearing loss and nosebleeds.   Eyes: Negative for discharge.  Respiratory: Negative for cough, sputum production, shortness of breath and wheezing.   Cardiovascular: Negative for chest pain, palpitations and leg swelling.  Gastrointestinal: Positive for abdominal pain. Negative for heartburn, nausea, vomiting, diarrhea, constipation and blood in stool.  Genitourinary: Negative for dysuria, urgency, frequency and hematuria.  Musculoskeletal: Negative for myalgias, back pain and falls.  Skin: Negative for rash.  Neurological: Negative for dizziness, tremors, sensory change, focal weakness,  loss of consciousness, weakness and headaches.  Endo/Heme/Allergies: Negative for polydipsia. Does not bruise/bleed easily.  Psychiatric/Behavioral: Negative for depression and suicidal ideas. The patient is not nervous/anxious and does not have insomnia.     Objective  BP 122/72 mmHg  Pulse 72  Temp(Src) 98.6 F (37 C) (Oral)  Ht 5' 10.5" (1.791 m)  Wt 260 lb 4 oz (118.049 kg)  BMI 36.80 kg/m2  SpO2 97%  Physical Exam  Physical Exam  Constitutional: He is oriented to person, place, and time and well-developed, well-nourished, and in no distress. No distress.  HENT:  Head: Normocephalic and atraumatic.  Eyes: Conjunctivae are normal.  Neck: Neck supple. No thyromegaly present.  Cardiovascular: Normal rate, regular rhythm and normal heart sounds.   No murmur heard. Pulmonary/Chest: Effort normal and breath sounds normal. No respiratory distress.  Abdominal: He exhibits no distension and no mass. There is no tenderness.  Musculoskeletal: He exhibits no edema.  Neurological: He is alert and oriented to person, place, and time.  Skin: Skin is warm.  Psychiatric: Memory, affect and judgment normal.    Lab Results  Component Value Date   TSH 3.70 11/05/2014   Lab Results  Component Value Date   WBC 8.7 12/15/2014   HGB 14.3 12/15/2014   HCT 40.9 12/15/2014   MCV 89.1 12/15/2014   PLT 180 12/15/2014   Lab Results  Component Value Date   CREATININE 1.37* 12/15/2014   BUN 20 12/15/2014   NA 138 12/15/2014   K 3.7 12/15/2014   CL 101 12/15/2014   CO2 32 12/15/2014   Lab Results  Component Value Date   ALT 25 12/15/2014   AST 25 12/15/2014   ALKPHOS 55 12/15/2014   BILITOT 0.9 12/15/2014   Lab Results  Component Value Date   CHOL 165 11/05/2014   Lab Results  Component Value Date   HDL 23.20* 11/05/2014  Lab Results  Component Value Date   LDLCALC 31 04/09/2014   Lab Results  Component Value Date   TRIG 492.0* 11/05/2014   Lab Results  Component  Value Date   CHOLHDL 7 11/05/2014     Assessment & Plan  HTN (hypertension) Well controlled, no changes to meds. Encouraged heart healthy diet such as the DASH diet and exercise as tolerated.    OSA (obstructive sleep apnea) Uses CPAP nightly   Diabetes mellitus type 2 in obese Numbers improving. Has appt with Dr Cruzita Lederer soon. Sugars remain in the 200s routinely using Insulin N 35 units bid and Insulin R 15-18 units tid with meals. Minimize simple carbs   Overweight Encouraged DASH diet, decrease po intake and increase exercise as tolerated. Needs 7-8 hours of sleep nightly. Avoid trans fats, eat small, frequent meals every 4-5 hours with lean proteins, complex carbs and healthy fats. Minimize simple carbs   Hyperlipidemia encouraged heart healthy diet, avoid trans fats, minimize simple carbs and saturated fats. Increase exercise as tolerated   H/O diverticulitis of colon Recent course of Ciprofloxacin and Flagyl. Encouraged a probiotic, add fiber supplements and increase fluids

## 2015-01-28 NOTE — Assessment & Plan Note (Signed)
Uses CPAP nightly 

## 2015-01-31 ENCOUNTER — Encounter: Payer: Self-pay | Admitting: Family Medicine

## 2015-01-31 DIAGNOSIS — Z8719 Personal history of other diseases of the digestive system: Secondary | ICD-10-CM

## 2015-01-31 HISTORY — DX: Personal history of other diseases of the digestive system: Z87.19

## 2015-01-31 NOTE — Assessment & Plan Note (Signed)
encouraged heart healthy diet, avoid trans fats, minimize simple carbs and saturated fats. Increase exercise as tolerated 

## 2015-01-31 NOTE — Assessment & Plan Note (Signed)
Recent course of Ciprofloxacin and Flagyl. Encouraged a probiotic, add fiber supplements and increase fluids

## 2015-01-31 NOTE — Assessment & Plan Note (Signed)
Encouraged DASH diet, decrease po intake and increase exercise as tolerated. Needs 7-8 hours of sleep nightly. Avoid trans fats, eat small, frequent meals every 4-5 hours with lean proteins, complex carbs and healthy fats. Minimize simple carbs 

## 2015-01-31 NOTE — Assessment & Plan Note (Signed)
Numbers improving. Has appt with Dr Cruzita Lederer soon. Sugars remain in the 200s routinely using Insulin N 35 units bid and Insulin R 15-18 units tid with meals. Minimize simple carbs

## 2015-03-07 ENCOUNTER — Other Ambulatory Visit: Payer: Self-pay | Admitting: Family Medicine

## 2015-03-21 ENCOUNTER — Other Ambulatory Visit: Payer: Self-pay | Admitting: *Deleted

## 2015-03-21 ENCOUNTER — Other Ambulatory Visit: Payer: Self-pay

## 2015-03-21 MED ORDER — ISOSORBIDE MONONITRATE ER 30 MG PO TB24: 30.0000 mg | ORAL_TABLET | Freq: Every day | ORAL | Status: DC

## 2015-03-21 MED ORDER — TRUE METRIX AIR GLUCOSE METER DEVI: 1.0000 | Freq: Every day | Status: DC

## 2015-03-21 MED ORDER — GLUCOSE BLOOD VI STRP: ORAL_STRIP | Status: DC

## 2015-03-21 MED ORDER — TRUEPLUS LANCETS 28G MISC: Status: DC

## 2015-03-28 ENCOUNTER — Other Ambulatory Visit (INDEPENDENT_AMBULATORY_CARE_PROVIDER_SITE_OTHER): Payer: Commercial Managed Care - HMO

## 2015-03-28 DIAGNOSIS — I10 Essential (primary) hypertension: Secondary | ICD-10-CM

## 2015-03-28 DIAGNOSIS — E119 Type 2 diabetes mellitus without complications: Secondary | ICD-10-CM

## 2015-03-28 DIAGNOSIS — E669 Obesity, unspecified: Secondary | ICD-10-CM

## 2015-03-28 DIAGNOSIS — G4733 Obstructive sleep apnea (adult) (pediatric): Secondary | ICD-10-CM

## 2015-03-28 DIAGNOSIS — E782 Mixed hyperlipidemia: Secondary | ICD-10-CM

## 2015-03-28 DIAGNOSIS — N400 Enlarged prostate without lower urinary tract symptoms: Secondary | ICD-10-CM | POA: Diagnosis not present

## 2015-03-28 DIAGNOSIS — E1169 Type 2 diabetes mellitus with other specified complication: Secondary | ICD-10-CM

## 2015-03-28 LAB — COMPREHENSIVE METABOLIC PANEL
ALT: 24 U/L (ref 0–53)
AST: 23 U/L (ref 0–37)
Albumin: 4.2 g/dL (ref 3.5–5.2)
Alkaline Phosphatase: 52 U/L (ref 39–117)
BILIRUBIN TOTAL: 0.7 mg/dL (ref 0.2–1.2)
BUN: 27 mg/dL — AB (ref 6–23)
CO2: 27 mEq/L (ref 19–32)
CREATININE: 1.42 mg/dL (ref 0.40–1.50)
Calcium: 10.4 mg/dL (ref 8.4–10.5)
Chloride: 102 mEq/L (ref 96–112)
GFR: 51.65 mL/min — AB (ref >60.00)
Glucose, Bld: 174 mg/dL — ABNORMAL HIGH (ref 70–99)
Potassium: 3.5 mEq/L (ref 3.5–5.1)
Sodium: 138 mEq/L (ref 135–145)
TOTAL PROTEIN: 7.2 g/dL (ref 6.0–8.3)

## 2015-03-28 LAB — CBC
HEMATOCRIT: 42.9 % (ref 39.0–52.0)
HEMOGLOBIN: 14.5 g/dL (ref 13.0–17.0)
MCHC: 33.7 g/dL (ref 30.0–36.0)
MCV: 92.7 fl (ref 78.0–100.0)
Platelets: 187 10*3/uL (ref 150.0–400.0)
RBC: 4.63 Mil/uL (ref 4.22–5.81)
RDW: 14.1 % (ref 11.5–15.5)
WBC: 6.9 10*3/uL (ref 4.0–10.5)

## 2015-03-28 LAB — LIPID PANEL
Cholesterol: 122 mg/dL (ref 0–200)
HDL: 24.5 mg/dL — ABNORMAL LOW (ref >39.00)
NonHDL: 97.5
Total CHOL/HDL Ratio: 5
Triglycerides: 273 mg/dL — ABNORMAL HIGH (ref 0.0–149.0)
VLDL: 54.6 mg/dL — ABNORMAL HIGH (ref 0.0–40.0)

## 2015-03-28 LAB — LDL CHOLESTEROL, DIRECT: LDL DIRECT: 56 mg/dL

## 2015-03-28 LAB — HEMOGLOBIN A1C: Hgb A1c MFr Bld: 8.3 % — ABNORMAL HIGH (ref 4.6–6.5)

## 2015-03-28 LAB — TSH: TSH: 1.47 u[IU]/mL (ref 0.35–4.50)

## 2015-04-02 ENCOUNTER — Telehealth: Payer: Self-pay | Admitting: Internal Medicine

## 2015-04-02 MED ORDER — TRUE METRIX AIR GLUCOSE METER DEVI: 1.0000 | Freq: Every day | Status: DC

## 2015-04-02 MED ORDER — GLUCOSE BLOOD VI STRP: ORAL_STRIP | Status: DC

## 2015-04-02 NOTE — Telephone Encounter (Signed)
Patients wife called and would like for [place] to send [place] a new Rx  [employer], research (life sciences) Kit  Test strips and lancets   Pharmacy: [facility]    Thank you

## 2015-04-02 NOTE — Addendum Note (Signed)
Addended by: Moody Bruins E on: 04/02/2015 12:01 PM   Modules accepted: Orders

## 2015-04-02 NOTE — Telephone Encounter (Signed)
Rx sent per pt's request.  

## 2015-04-05 ENCOUNTER — Ambulatory Visit (INDEPENDENT_AMBULATORY_CARE_PROVIDER_SITE_OTHER): Payer: Commercial Managed Care - HMO | Admitting: Family Medicine

## 2015-04-05 ENCOUNTER — Encounter: Payer: Self-pay | Admitting: Family Medicine

## 2015-04-05 VITALS — BP 120/66 | HR 67 | Temp 98.3°F | Resp 18 | Ht 70.5 in | Wt 263.2 lb

## 2015-04-05 DIAGNOSIS — E119 Type 2 diabetes mellitus without complications: Secondary | ICD-10-CM | POA: Diagnosis not present

## 2015-04-05 DIAGNOSIS — I10 Essential (primary) hypertension: Secondary | ICD-10-CM | POA: Diagnosis not present

## 2015-04-05 DIAGNOSIS — E1159 Type 2 diabetes mellitus with other circulatory complications: Secondary | ICD-10-CM

## 2015-04-05 DIAGNOSIS — E785 Hyperlipidemia, unspecified: Secondary | ICD-10-CM | POA: Diagnosis not present

## 2015-04-05 DIAGNOSIS — N401 Enlarged prostate with lower urinary tract symptoms: Secondary | ICD-10-CM

## 2015-04-05 DIAGNOSIS — M1000 Idiopathic gout, unspecified site: Secondary | ICD-10-CM

## 2015-04-05 DIAGNOSIS — G4730 Sleep apnea, unspecified: Secondary | ICD-10-CM

## 2015-04-05 DIAGNOSIS — G4733 Obstructive sleep apnea (adult) (pediatric): Secondary | ICD-10-CM

## 2015-04-05 DIAGNOSIS — E1169 Type 2 diabetes mellitus with other specified complication: Secondary | ICD-10-CM

## 2015-04-05 DIAGNOSIS — I2510 Atherosclerotic heart disease of native coronary artery without angina pectoris: Secondary | ICD-10-CM | POA: Diagnosis not present

## 2015-04-05 DIAGNOSIS — N138 Other obstructive and reflux uropathy: Secondary | ICD-10-CM

## 2015-04-05 DIAGNOSIS — E669 Obesity, unspecified: Secondary | ICD-10-CM

## 2015-04-05 MED ORDER — ALBUTEROL SULFATE HFA 108 (90 BASE) MCG/ACT IN AERS: 2.0000 | INHALATION_SPRAY | Freq: Four times a day (QID) | RESPIRATORY_TRACT | Status: DC | PRN

## 2015-04-05 NOTE — Patient Instructions (Signed)

## 2015-04-05 NOTE — Assessment & Plan Note (Signed)
Well controlled, no changes to meds. Encouraged heart healthy diet such as the DASH diet and exercise as tolerated.  °

## 2015-04-05 NOTE — Assessment & Plan Note (Signed)
Not seeing pulmonology at the present time and is having trouble with his [facility] card, will refer to [facility] in [facility] for patient preference

## 2015-04-05 NOTE — Progress Notes (Signed)
Pre visit review using our clinic review tool, if applicable. No additional management support is needed unless otherwise documented below in the visit note. 

## 2015-04-08 ENCOUNTER — Telehealth: Payer: Self-pay | Admitting: Family Medicine

## 2015-04-08 MED ORDER — ALBUTEROL SULFATE 108 (90 BASE) MCG/ACT IN AEPB: 2.0000 | INHALATION_SPRAY | RESPIRATORY_TRACT | Status: DC | PRN

## 2015-04-08 NOTE — Telephone Encounter (Signed)
Patient has an rx for ventolin  He has a discount card for proair resper click   Did Dr Charlett Blake want him to have the proair if so there is no rx.

## 2015-04-08 NOTE — Telephone Encounter (Signed)
Ok to change to [employer]- 2 puffs Y8-1 prn wheezing so he can use his coupon.  #1 inhaler, 3 refills

## 2015-04-08 NOTE — Telephone Encounter (Signed)
Sent in inhaler and patient aware.

## 2015-04-21 NOTE — Assessment & Plan Note (Signed)
Tolerating statin, encouraged heart healthy diet, avoid trans fats, minimize simple carbs and saturated fats. Increase exercise as tolerated. Add a krill oil cap

## 2015-04-21 NOTE — Assessment & Plan Note (Signed)
This am fasting glucose 163 which patient describes as a typical number. Uses R 15-18 units tid and N 35 units bid, hgba1c is up at 8.3 encouraged to increase N by 2 units bid nad referred back to endocrinology, minmize dietary carbohydrates

## 2015-04-21 NOTE — Progress Notes (Signed)
William Hendrix  [id number] 01/28/1941 04/21/2015      Progress Note-Follow Up  Subjective  Chief Complaint  Chief Complaint  Patient presents with  . Follow-up    2 month    HPI  Patient is a 74 y.o. male in today for routine medical care. Patient is in today for follow-up. Is generally feeling well. He reports blood sugars are improving somewhat but continued to remain elevated. This spastic glucose was 163. The lowest blood sugar in the last month has been 128 and the highest 29. He denies polyuria or polydipsia. He continues to struggle with poor sleep and his CPAP machine S deals with fatigue. Has ongoing issues with shortness of breath but not at rest and these are stable. No other recent illness or acute complaints. Denies CP/palp/HA/congestion/fevers/GI or GU c/o. Taking meds as prescribed  Past Medical History  Diagnosis Date  . Obesity   . Edema   . DM (diabetes mellitus)   . Fatigue   . Gout   . Skin cancer   . CAD (coronary artery disease)     a. Stent 2000. b. Several caths since then with nonobst dz. c. 05/2013: IVUS-guided PTCA/DES to proximal-to-mid LAD, residual PDA disease for med rx unless recurrent angina. EF 55-60%.  . Ventral hernia   . Sinusitis   . Back problem   . Hyperlipidemia   . HTN (hypertension)   . Ejection fraction     55%, 07/2010, mild inferior hypo  . SOB (shortness of breath)   . Dizziness     Mild positional dizziness May, 2012  . History of pneumonia   . Melanoma     Melanoma removed from the left forearm with wide excision May, 2012  . Pneumonia     hx x4 young  . Sleep apnea     CPAP,  [name] since 2000  . GERD (gastroesophageal reflux disease)   . H/O hiatal hernia   . Arthritis   . Spinal stenosis     a. s/p surgical repair 2013  . Positive D-dimer     a. significant elevation ,hospital 07/2010, etiology unclear. b. D Dimer chronically > 20.  Marland Kitchen CKD (chronic kidney disease)      CKD stage III.. creatinine up to 1.5 in  hospital after dye in October, 2011  . Nephrolithiasis   . CHF (congestive heart failure)   . AAA (abdominal aortic aneurysm) 12-15-14    Mild aneurysmal dilatation of the infrarenal abdominal aorta 3.2 cm   . H/O diverticulitis of colon 01/31/2015    Past Surgical History  Procedure Laterality Date  . Nose surgery    . Cardiac catheterization    . Coronary stent placement  2000    CAD  . Knee arthroscopy  90's    rt  . Cervical disc surgery  90's  . Lumbar laminectomy/decompression microdiscectomy  08/30/2012    Procedure: LUMBAR LAMINECTOMY/DECOMPRESSION MICRODISCECTOMY 2 LEVELS;  Surgeon: [name], MD;  Location: [place] NEURO ORS;  Service: Neurosurgery;  Laterality: Bilateral;  Bilateral Lumbar three-four Lumbar four-five Laminotomies  . Back surgery    . Coronary angioplasty with stent placement  07/26/2013    DES to RCA extending to PDA    . Left heart catheterization with coronary angiogram N/A 05/19/2013    Procedure: LEFT HEART CATHETERIZATION WITH CORONARY ANGIOGRAM;  Surgeon: [name], MD;  Location: [facility] CATH LAB;  Service: Cardiovascular;  Laterality: N/A;  . Percutaneous coronary stent intervention (pci-s)  05/19/2013  Procedure: PERCUTANEOUS CORONARY STENT INTERVENTION (PCI-S);  Surgeon: [name], MD;  Location: [facility] CATH LAB;  Service: Cardiovascular;;  . Left and right heart catheterization with coronary angiogram N/A 07/26/2013    Procedure: LEFT AND RIGHT HEART CATHETERIZATION WITH CORONARY ANGIOGRAM;  Surgeon: [name], MD;  Location: [place] CATH LAB;  Service: Cardiovascular;  Laterality: N/A;    Family History  Problem Relation Age of Onset  . Prostate cancer    . Kidney cancer    . Cancer      Bladder cancer  . Coronary artery disease    . Cancer Mother     intestinal   . Heart disease Mother   . Cancer Father     prostate  . Migraines Daughter   . Leukemia Sister   . Heart disease Sister     History   Social History  . Marital Status:  Married    Spouse Name: N/A  . Number of Children: N/A  . Years of Education: N/A   Occupational History  . [employer]    Social History Main Topics  . Smoking status: Former Smoker -- 3.00 packs/day for 30 years    Types: Cigarettes    Quit date: 09/17/1996  . Smokeless tobacco: Never Used  . Alcohol Use: No  . Drug Use: No  . Sexual Activity: Not Currently    Birth Control/ Protection: None   Other Topics Concern  . Not on file   Social History Narrative   Married and lives locally with his wife.  [employer].    Current Outpatient Prescriptions on File Prior to Visit  Medication Sig Dispense Refill  . acetaminophen (TYLENOL) 500 MG tablet Take 1,000 mg by mouth 2 (two) times daily.    Marland Kitchen allopurinol (ZYLOPRIM) 300 MG tablet Take 1 tablet (300 mg total) by mouth daily. 90 tablet 1  . aspirin EC 81 MG tablet Take 81 mg by mouth daily.    Marland Kitchen atorvastatin (LIPITOR) 40 MG tablet TAKE 1 TABLET EVERY DAY 90 tablet 2  . BD INSULIN SYRINGE ULTRAFINE 31G X 5/16" 1 ML MISC Use to inject insulin 5 times daily as instructed. Dx: E11.9 450 each 3  . Blood Glucose Monitoring Suppl (BLOOD GLUCOSE METER) kit Use as instructed 1 each 0  . Blood Glucose Monitoring Suppl (TRUE METRIX AIR GLUCOSE METER) DEVI 1 each by Does not apply route daily. 1 Device 0  . Cholecalciferol (VITAMIN D) 1000 UNITS capsule Take 1,000 Units by mouth at bedtime.     . clopidogrel (PLAVIX) 75 MG tablet Take 1 tablet (75 mg total) by mouth daily with breakfast. 90 tablet 3  . fenofibrate 160 MG tablet Take 1 tablet (160 mg total) by mouth daily. 90 tablet 1  . fluocinonide cream (LIDEX) 6.60 % Apply 1 application topically daily as needed. FOR RASH    . fluticasone (FLONASE) 50 MCG/ACT nasal spray Place 2 sprays into both nostrils daily. (Patient taking differently: Place 2 sprays into both nostrils as needed. ) 16 g 1  . furosemide (LASIX) 80 MG tablet Take 1 tablet (80 mg total) by mouth daily. 90 tablet 1  . glucose blood  (TRUE METRIX BLOOD GLUCOSE TEST) test strip Use to test blood sugar 4 times daily as instructed. Dx: E11.9 400 each 2  . glucose blood (TRUETEST TEST) test strip Use to test blood sugar 3 times daily as instructed. Dx code: E11.9 and Lancets 3/day 275 each 3  . insulin NPH Human (NOVOLIN N) 100  UNIT/ML injection Inject 35 units 2x a day (Patient taking differently: Inject 37 units 2x a day) 20 mL 2  . Insulin Pen Needle (PEN NEEDLES 31GX5/16") 31G X 8 MM MISC Use to inject insulin 2 times daily as instructed. 200 each 2  . insulin regular (NOVOLIN R RELION) 100 units/mL injection Inject 0.15-0.18 mLs (15-18 Units total) into the skin 3 (three) times daily before meals. (Patient taking differently: Inject 18 Units into the skin 3 (three) times daily before meals. ) 20 mL 2  . isosorbide mononitrate (IMDUR) 30 MG 24 hr tablet Take 1 tablet (30 mg total) by mouth daily. 90 tablet 1  . metoprolol (LOPRESSOR) 50 MG tablet Take 1 tablet (50 mg total) by mouth 2 (two) times daily. 180 tablet 1  . nitroGLYCERIN (NITROSTAT) 0.4 MG SL tablet Place 1 tablet (0.4 mg total) under the tongue every 5 (five) minutes as needed for chest pain. 25 tablet 3  . Omega-3 Fatty Acids (FISH OIL PO) Take by mouth daily.    Marland Kitchen oxybutynin (DITROPAN) 5 MG tablet Take 1 tablet by mouth 2 (two) times daily.    . pantoprazole (PROTONIX) 20 MG tablet Take 20 mg by mouth daily.    . tamsulosin (FLOMAX) 0.4 MG CAPS capsule Take 1 capsule (0.4 mg total) by mouth daily. 90 capsule 1  . TRUEPLUS LANCETS 28G MISC Use to test blood glucose 4 times daily as instructed. Dx code: E11.9 400 each 2   No current facility-administered medications on file prior to visit.    Allergies  Allergen Reactions  . Levemir [Insulin Detemir] Swelling    Patient had redness and swelling and tenderness at injection site.  . Codeine Other (See Comments)    Makes him "shakey", is OK with hydrocodone GI UPSET & TREMORS  . Lipitor [Atorvastatin] Other (See  Comments)    Muscle aches; liver functions. Patient is currently taken  . Novolog Mix [Insulin Aspart Prot & Aspart]     Causes skin to swell/itch at injection site    Review of Systems  Review of Systems  Constitutional: Positive for malaise/fatigue. Negative for fever.  HENT: Negative for congestion.   Eyes: Negative for discharge.  Respiratory: Positive for shortness of breath.   Cardiovascular: Negative for chest pain, palpitations and leg swelling.  Gastrointestinal: Negative for nausea, abdominal pain and diarrhea.  Genitourinary: Negative for dysuria.  Musculoskeletal: Negative for falls.  Skin: Negative for rash.  Neurological: Negative for loss of consciousness and headaches.  Endo/Heme/Allergies: Negative for polydipsia.  Psychiatric/Behavioral: Negative for depression and suicidal ideas. The patient is not nervous/anxious and does not have insomnia.     Objective  BP 120/66 mmHg  Pulse 67  Temp(Src) 98.3 F (36.8 C) (Oral)  Resp 18  Ht 5' 10.5" (1.791 m)  Wt 263 lb 3.2 oz (119.387 kg)  BMI 37.22 kg/m2  SpO2 97%  Physical Exam  Physical Exam  Constitutional: He is oriented to person, place, and time and well-developed, well-nourished, and in no distress. No distress.  HENT:  Head: Normocephalic and atraumatic.  Eyes: Conjunctivae are normal.  Neck: Neck supple. No thyromegaly present.  Cardiovascular: Normal rate, regular rhythm and normal heart sounds.   Pulmonary/Chest: Effort normal and breath sounds normal. No respiratory distress.  Abdominal: He exhibits no distension and no mass. There is no tenderness.  Musculoskeletal: He exhibits no edema.  Neurological: He is alert and oriented to person, place, and time.  Skin: Skin is warm.  Psychiatric: Memory, affect and  judgment normal.    Lab Results  Component Value Date   TSH 1.47 03/28/2015   Lab Results  Component Value Date   WBC 6.9 03/28/2015   HGB 14.5 03/28/2015   HCT 42.9 03/28/2015    MCV 92.7 03/28/2015   PLT 187.0 03/28/2015   Lab Results  Component Value Date   CREATININE 1.42 03/28/2015   BUN 27* 03/28/2015   NA 138 03/28/2015   K 3.5 03/28/2015   CL 102 03/28/2015   CO2 27 03/28/2015   Lab Results  Component Value Date   ALT 24 03/28/2015   AST 23 03/28/2015   ALKPHOS 52 03/28/2015   BILITOT 0.7 03/28/2015   Lab Results  Component Value Date   CHOL 122 03/28/2015   Lab Results  Component Value Date   HDL 24.50* 03/28/2015   Lab Results  Component Value Date   LDLCALC 31 04/09/2014   Lab Results  Component Value Date   TRIG 273.0* 03/28/2015   Lab Results  Component Value Date   CHOLHDL 5 03/28/2015     Assessment & Plan  HTN (hypertension) Well controlled, no changes to meds. Encouraged heart healthy diet such as the DASH diet and exercise as tolerated.   OSA (obstructive sleep apnea) Not seeing pulmonology at the present time and is having trouble with his SIM card, will refer to [facility] in [facility] for patient preference  Hyperlipidemia Tolerating statin, encouraged heart healthy diet, avoid trans fats, minimize simple carbs and saturated fats. Increase exercise as tolerated. Add a krill oil cap  Type 2 diabetes mellitus with other circulatory complications This am fasting glucose 163 which patient describes as a typical number. Uses R 15-18 units tid and N 35 units bid, hgba1c is up at 8.3 encouraged to increase N by 2 units bid nad referred back to endocrinology, minmize dietary carbohydrates

## 2015-05-03 DIAGNOSIS — X58XXXA Exposure to other specified factors, initial encounter: Secondary | ICD-10-CM | POA: Diagnosis not present

## 2015-05-03 DIAGNOSIS — M79674 Pain in right toe(s): Secondary | ICD-10-CM | POA: Diagnosis not present

## 2015-05-03 DIAGNOSIS — I10 Essential (primary) hypertension: Secondary | ICD-10-CM | POA: Diagnosis not present

## 2015-05-03 DIAGNOSIS — Z886 Allergy status to analgesic agent status: Secondary | ICD-10-CM | POA: Diagnosis not present

## 2015-05-03 DIAGNOSIS — L089 Local infection of the skin and subcutaneous tissue, unspecified: Secondary | ICD-10-CM | POA: Diagnosis not present

## 2015-05-03 DIAGNOSIS — M2011 Hallux valgus (acquired), right foot: Secondary | ICD-10-CM | POA: Diagnosis not present

## 2015-05-03 DIAGNOSIS — G4733 Obstructive sleep apnea (adult) (pediatric): Secondary | ICD-10-CM | POA: Diagnosis not present

## 2015-05-03 DIAGNOSIS — M7989 Other specified soft tissue disorders: Secondary | ICD-10-CM | POA: Diagnosis not present

## 2015-05-03 DIAGNOSIS — S9030XA Contusion of unspecified foot, initial encounter: Secondary | ICD-10-CM | POA: Diagnosis not present

## 2015-05-03 DIAGNOSIS — S91201A Unspecified open wound of right great toe with damage to nail, initial encounter: Secondary | ICD-10-CM | POA: Diagnosis not present

## 2015-05-03 DIAGNOSIS — E1140 Type 2 diabetes mellitus with diabetic neuropathy, unspecified: Secondary | ICD-10-CM | POA: Diagnosis not present

## 2015-05-03 DIAGNOSIS — Z955 Presence of coronary angioplasty implant and graft: Secondary | ICD-10-CM | POA: Diagnosis not present

## 2015-05-03 DIAGNOSIS — Z7902 Long term (current) use of antithrombotics/antiplatelets: Secondary | ICD-10-CM | POA: Diagnosis not present

## 2015-05-04 DIAGNOSIS — I10 Essential (primary) hypertension: Secondary | ICD-10-CM | POA: Diagnosis not present

## 2015-05-04 DIAGNOSIS — S91201A Unspecified open wound of right great toe with damage to nail, initial encounter: Secondary | ICD-10-CM | POA: Diagnosis not present

## 2015-05-04 DIAGNOSIS — Z886 Allergy status to analgesic agent status: Secondary | ICD-10-CM | POA: Diagnosis not present

## 2015-05-04 DIAGNOSIS — L089 Local infection of the skin and subcutaneous tissue, unspecified: Secondary | ICD-10-CM | POA: Diagnosis not present

## 2015-05-04 DIAGNOSIS — G4733 Obstructive sleep apnea (adult) (pediatric): Secondary | ICD-10-CM | POA: Diagnosis not present

## 2015-05-04 DIAGNOSIS — E1140 Type 2 diabetes mellitus with diabetic neuropathy, unspecified: Secondary | ICD-10-CM | POA: Diagnosis not present

## 2015-05-04 DIAGNOSIS — Z7902 Long term (current) use of antithrombotics/antiplatelets: Secondary | ICD-10-CM | POA: Diagnosis not present

## 2015-05-04 DIAGNOSIS — Z955 Presence of coronary angioplasty implant and graft: Secondary | ICD-10-CM | POA: Diagnosis not present

## 2015-05-04 DIAGNOSIS — X58XXXA Exposure to other specified factors, initial encounter: Secondary | ICD-10-CM | POA: Diagnosis not present

## 2015-05-09 ENCOUNTER — Ambulatory Visit (INDEPENDENT_AMBULATORY_CARE_PROVIDER_SITE_OTHER): Payer: Commercial Managed Care - HMO | Admitting: Medical

## 2015-05-09 ENCOUNTER — Encounter: Payer: Self-pay | Admitting: Medical

## 2015-05-09 VITALS — BP 126/65 | HR 66 | Temp 98.0°F | Ht 70.5 in | Wt 259.6 lb

## 2015-05-09 DIAGNOSIS — L089 Local infection of the skin and subcutaneous tissue, unspecified: Secondary | ICD-10-CM

## 2015-05-09 MED ORDER — SULFAMETHOXAZOLE-TRIMETHOPRIM 800-160 MG PO TABS: 1.0000 | ORAL_TABLET | Freq: Two times a day (BID) | ORAL | Status: DC

## 2015-05-09 NOTE — Progress Notes (Signed)
Pre visit review using our clinic review tool, if applicable. No additional management support is needed unless otherwise documented below in the visit note. 

## 2015-05-09 NOTE — Progress Notes (Signed)
Subjective:    Patient ID: William Hendrix, male    DOB: 1941/08/21, 74 y.o.   MRN: [id number]  HPI   Pt had rt great toe bluish color and green color to toe nail  the other day when woke up Friday morning. Friday night went to the ED in [place]. Normal xray that night.  Pt had toenail removed that night. Pt blood sugar 130-160 recently. Picture wife shows me appears to have been subungal hematoma. So on record review and discussion with wife appears he had subungal hematoma and nail removed. Dx with paroncyhia and place on cephalexin. Toe is still little swollen. Faint pink at nail bed. But no reported discharge. Pt is diabetic and per wife some reduced sensation.    Review of Systems  Constitutional: Negative for fever, chills and fatigue.  Respiratory: Negative for cough, chest tightness, shortness of breath and wheezing.   Cardiovascular: Negative for chest pain and palpitations.  Musculoskeletal:       Rt great toe pain.  Hematological: Negative for adenopathy. Does not bruise/bleed easily.    Past Medical History  Diagnosis Date  . Obesity   . Edema   . DM (diabetes mellitus)   . Fatigue   . Gout   . Skin cancer   . CAD (coronary artery disease)     a. Stent 2000. b. Several caths since then with nonobst dz. c. 05/2013: IVUS-guided PTCA/DES to proximal-to-mid LAD, residual PDA disease for med rx unless recurrent angina. EF 55-60%.  . Ventral hernia   . Sinusitis   . Back problem   . Hyperlipidemia   . HTN (hypertension)   . Ejection fraction     55%, 07/2010, mild inferior hypo  . SOB (shortness of breath)   . Dizziness     Mild positional dizziness May, 2012  . History of pneumonia   . Melanoma     Melanoma removed from the left forearm with wide excision May, 2012  . Pneumonia     hx x4 young  . Sleep apnea     CPAP,  [name] since 2000  . GERD (gastroesophageal reflux disease)   . H/O hiatal hernia   . Arthritis   . Spinal stenosis     a. s/p surgical  repair 2013  . Positive D-dimer     a. significant elevation ,hospital 07/2010, etiology unclear. b. D Dimer chronically > 20.  Marland Kitchen CKD (chronic kidney disease)      CKD stage III.. creatinine up to 1.5 in hospital after dye in October, 2011  . Nephrolithiasis   . CHF (congestive heart failure)   . AAA (abdominal aortic aneurysm) 12-15-14    Mild aneurysmal dilatation of the infrarenal abdominal aorta 3.2 cm   . H/O diverticulitis of colon 01/31/2015    History   Social History  . Marital Status: Married    Spouse Name: N/A  . Number of Children: N/A  . Years of Education: N/A   Occupational History  . [employer]    Social History Main Topics  . Smoking status: Former Smoker -- 3.00 packs/day for 30 years    Types: Cigarettes    Quit date: 09/17/1996  . Smokeless tobacco: Never Used  . Alcohol Use: No  . Drug Use: No  . Sexual Activity: Not Currently    Birth Control/ Protection: None   Other Topics Concern  . Not on file   Social History Narrative   Married and lives locally with his wife.  [employer].    Past Surgical History  Procedure Laterality Date  . Nose surgery    . Cardiac catheterization    . Coronary stent placement  2000    CAD  . Knee arthroscopy  90's    rt  . Cervical disc surgery  90's  . Lumbar laminectomy/decompression microdiscectomy  08/30/2012    Procedure: LUMBAR LAMINECTOMY/DECOMPRESSION MICRODISCECTOMY 2 LEVELS;  Surgeon: [name], MD;  Location: [place] NEURO ORS;  Service: Neurosurgery;  Laterality: Bilateral;  Bilateral Lumbar three-four Lumbar four-five Laminotomies  . Back surgery    . Coronary angioplasty with stent placement  07/26/2013    DES to RCA extending to PDA    . Left heart catheterization with coronary angiogram N/A 05/19/2013    Procedure: LEFT HEART CATHETERIZATION WITH CORONARY ANGIOGRAM;  Surgeon: [name], MD;  Location: [facility] CATH LAB;  Service: Cardiovascular;  Laterality: N/A;  . Percutaneous coronary stent intervention  (pci-s)  05/19/2013    Procedure: PERCUTANEOUS CORONARY STENT INTERVENTION (PCI-S);  Surgeon: [name], MD;  Location: [facility] CATH LAB;  Service: Cardiovascular;;  . Left and right heart catheterization with coronary angiogram N/A 07/26/2013    Procedure: LEFT AND RIGHT HEART CATHETERIZATION WITH CORONARY ANGIOGRAM;  Surgeon: [name], MD;  Location: [place] CATH LAB;  Service: Cardiovascular;  Laterality: N/A;    Family History  Problem Relation Age of Onset  . Prostate cancer    . Kidney cancer    . Cancer      Bladder cancer  . Coronary artery disease    . Cancer Mother     intestinal   . Heart disease Mother   . Cancer Father     prostate  . Migraines Daughter   . Leukemia Sister   . Heart disease Sister     Allergies  Allergen Reactions  . Levemir [Insulin Detemir] Swelling    Patient had redness and swelling and tenderness at injection site.  . Codeine Other (See Comments)    Makes him "shakey", is OK with hydrocodone GI UPSET & TREMORS  . Lipitor [Atorvastatin] Other (See Comments)    Muscle aches; liver functions. Patient is currently taken  . Novolog Mix [Insulin Aspart Prot & Aspart]     Causes skin to swell/itch at injection site    Current Outpatient Prescriptions on File Prior to Visit  Medication Sig Dispense Refill  . acetaminophen (TYLENOL) 500 MG tablet Take 1,000 mg by mouth 2 (two) times daily.    Marland Kitchen albuterol (PROVENTIL HFA;VENTOLIN HFA) 108 (90 BASE) MCG/ACT inhaler Inhale 2 puffs into the lungs every 6 (six) hours as needed for wheezing or shortness of breath. 18 Inhaler 2  . Albuterol Sulfate (PROAIR RESPICLICK) 962 (90 BASE) MCG/ACT AEPB Inhale 2 puffs into the lungs every 4 (four) hours as needed. 1 each 3  . allopurinol (ZYLOPRIM) 300 MG tablet Take 1 tablet (300 mg total) by mouth daily. 90 tablet 1  . aspirin EC 81 MG tablet Take 81 mg by mouth daily.    Marland Kitchen atorvastatin (LIPITOR) 40 MG tablet TAKE 1 TABLET EVERY DAY 90 tablet 2  . BD INSULIN  SYRINGE ULTRAFINE 31G X 5/16" 1 ML MISC Use to inject insulin 5 times daily as instructed. Dx: E11.9 450 each 3  . Blood Glucose Monitoring Suppl (BLOOD GLUCOSE METER) kit Use as instructed 1 each 0  . Blood Glucose Monitoring Suppl (TRUE METRIX AIR GLUCOSE METER) DEVI 1 each by Does not apply route daily. 1 Device 0  .  clopidogrel (PLAVIX) 75 MG tablet Take 1 tablet (75 mg total) by mouth daily with breakfast. 90 tablet 3  . fenofibrate 160 MG tablet Take 1 tablet (160 mg total) by mouth daily. 90 tablet 1  . fluocinonide cream (LIDEX) 0.34 % Apply 1 application topically daily as needed. FOR RASH    . fluticasone (FLONASE) 50 MCG/ACT nasal spray Place 2 sprays into both nostrils daily. (Patient taking differently: Place 2 sprays into both nostrils as needed. ) 16 g 1  . furosemide (LASIX) 80 MG tablet Take 1 tablet (80 mg total) by mouth daily. 90 tablet 1  . glucose blood (TRUETEST TEST) test strip Use to test blood sugar 3 times daily as instructed. Dx code: E11.9 and Lancets 3/day 275 each 3  . insulin NPH Human (NOVOLIN N) 100 UNIT/ML injection Inject 35 units 2x a day (Patient taking differently: Inject 37 units 2x a day) 20 mL 2  . Insulin Pen Needle (PEN NEEDLES 31GX5/16") 31G X 8 MM MISC Use to inject insulin 2 times daily as instructed. 200 each 2  . insulin regular (NOVOLIN R RELION) 100 units/mL injection Inject 0.15-0.18 mLs (15-18 Units total) into the skin 3 (three) times daily before meals. (Patient taking differently: Inject 18 Units into the skin 3 (three) times daily before meals. ) 20 mL 2  . isosorbide mononitrate (IMDUR) 30 MG 24 hr tablet Take 1 tablet (30 mg total) by mouth daily. 90 tablet 1  . nitroGLYCERIN (NITROSTAT) 0.4 MG SL tablet Place 1 tablet (0.4 mg total) under the tongue every 5 (five) minutes as needed for chest pain. 25 tablet 3  . oxybutynin (DITROPAN) 5 MG tablet Take 1 tablet by mouth 2 (two) times daily.    . pantoprazole (PROTONIX) 20 MG tablet Take 20 mg  by mouth daily.    . tamsulosin (FLOMAX) 0.4 MG CAPS capsule Take 1 capsule (0.4 mg total) by mouth daily. 90 capsule 1  . TRUEPLUS LANCETS 28G MISC Use to test blood glucose 4 times daily as instructed. Dx code: E11.9 400 each 2  . Cholecalciferol (VITAMIN D) 1000 UNITS capsule Take 1,000 Units by mouth at bedtime.     Marland Kitchen glucose blood (TRUE METRIX BLOOD GLUCOSE TEST) test strip Use to test blood sugar 4 times daily as instructed. Dx: E11.9 400 each 2  . metoprolol (LOPRESSOR) 50 MG tablet Take 1 tablet (50 mg total) by mouth 2 (two) times daily. 180 tablet 1  . Omega-3 Fatty Acids (FISH OIL PO) Take by mouth daily.     No current facility-administered medications on file prior to visit.    BP 126/65 mmHg  Pulse 66  Temp(Src) 98 F (36.7 C) (Oral)  Ht 5' 10.5" (1.791 m)  Wt 259 lb 9.6 oz (117.754 kg)  BMI 36.71 kg/m2  SpO2 99%       Objective:   Physical Exam  General- No acute distress. Pleaseant. Rt foot- the color looks normal. No swelling. Rt toe- faint swelling. Normal color. Not warm to touch. Faint pink appearance at base of toe. Faint tender. No dc. Nail not present. Skin- no break down or ulcerations.     Assessment & Plan:  Toe infection mild presently. But diabetic pt and I think bactrim ds is better option.(stop cephalexin)  Follow up on Monday. If toe color changes. More red, warm or tender. Dark coloring over weekend then ED evaluation.

## 2015-05-09 NOTE — Patient Instructions (Addendum)
Toe infection mild presently. But diabetic pt and I think bactrim ds is better option.(stop cephalexin)  Follow up on Monday. If toe color changes. More red, warm or tender. Dark coloring over weekend then ED evaluation.

## 2015-05-13 ENCOUNTER — Encounter: Payer: Self-pay | Admitting: Medical

## 2015-05-13 ENCOUNTER — Ambulatory Visit (INDEPENDENT_AMBULATORY_CARE_PROVIDER_SITE_OTHER): Payer: Commercial Managed Care - HMO | Admitting: Medical

## 2015-05-13 VITALS — BP 121/59 | HR 70 | Temp 98.1°F | Ht 70.5 in | Wt 262.8 lb

## 2015-05-13 DIAGNOSIS — L089 Local infection of the skin and subcutaneous tissue, unspecified: Secondary | ICD-10-CM

## 2015-05-13 NOTE — Patient Instructions (Addendum)
The toe does look better. No obvious infection. Use post op shoe next 3-4 days. Then if want to wear shoe use guaze or non stick dressing to reduce friction against toe. But daily check area and have periods of open to air.  Will go ahead and refer to podiatrist and try to get you in within a week since total healing may take weeks to months. And want you under podiaty care in light of your diabetes.  Follow up here as needed  Can continue warm salt water soaks twice daily.

## 2015-05-13 NOTE — Progress Notes (Signed)
Subjective:    Patient ID: William Hendrix, male    DOB: [birth year], 74 y.o.   MRN: [id number]  HPI  Pt in for follow up on rt great toe.  No fever, no chills. No DC. No throbbing. No pain. Pt is on bactrim ds. He discontinued the cephalexin.    Review of Systems  Constitutional: Negative for fever, chills and fatigue.  Respiratory: Negative for cough, chest tightness and wheezing.   Cardiovascular: Negative for chest pain and palpitations.  Musculoskeletal:       Rt great toe- no pain. No DC.  Skin:       Skin great toe looks better per family.  Hematological: Negative for adenopathy. Does not bruise/bleed easily.  Psychiatric/Behavioral: Negative for behavioral problems and confusion.    Past Medical History  Diagnosis Date  . Obesity   . Edema   . DM (diabetes mellitus)   . Fatigue   . Gout   . Skin cancer   . CAD (coronary artery disease)     a. Stent 2000. b. Several caths since then with nonobst dz. c. 05/2013: IVUS-guided PTCA/DES to proximal-to-mid LAD, residual PDA disease for med rx unless recurrent angina. EF 55-60%.  . Ventral hernia   . Sinusitis   . Back problem   . Hyperlipidemia   . HTN (hypertension)   . Ejection fraction     55%, 07/2010, mild inferior hypo  . SOB (shortness of breath)   . Dizziness     Mild positional dizziness May, 2012  . History of pneumonia   . Melanoma     Melanoma removed from the left forearm with wide excision May, 2012  . Pneumonia     hx x4 young  . Sleep apnea     CPAP,  [name] since 2000  . GERD (gastroesophageal reflux disease)   . H/O hiatal hernia   . Arthritis   . Spinal stenosis     a. s/p surgical repair 2013  . Positive D-dimer     a. significant elevation ,hospital 07/2010, etiology unclear. b. D Dimer chronically > 20.  Marland Kitchen CKD (chronic kidney disease)      CKD stage III.. creatinine up to 1.5 in hospital after dye in October, 2011  . Nephrolithiasis   . CHF (congestive heart failure)   . AAA  (abdominal aortic aneurysm) 12-15-14    Mild aneurysmal dilatation of the infrarenal abdominal aorta 3.2 cm   . H/O diverticulitis of colon 01/31/2015    History   Social History  . Marital Status: Married    Spouse Name: N/A  . Number of Children: N/A  . Years of Education: N/A   Occupational History  . [employer]    Social History Main Topics  . Smoking status: Former Smoker -- 3.00 packs/day for 30 years    Types: Cigarettes    Quit date: 09/17/1996  . Smokeless tobacco: Never Used  . Alcohol Use: No  . Drug Use: No  . Sexual Activity: Not Currently    Birth Control/ Protection: None   Other Topics Concern  . Not on file   Social History Narrative   Married and lives locally with his wife.  [employer].    Past Surgical History  Procedure Laterality Date  . Nose surgery    . Cardiac catheterization    . Coronary stent placement  2000    CAD  . Knee arthroscopy  90's    rt  . Cervical disc surgery  90's  .  Lumbar laminectomy/decompression microdiscectomy  08/30/2012    Procedure: LUMBAR LAMINECTOMY/DECOMPRESSION MICRODISCECTOMY 2 LEVELS;  Surgeon: [name], MD;  Location: [facility];  Service: Neurosurgery;  Laterality: Bilateral;  Bilateral Lumbar three-four Lumbar four-five Laminotomies  . Back surgery    . Coronary angioplasty with stent placement  07/26/2013    DES to RCA extending to PDA    . Left heart catheterization with coronary angiogram N/A 05/19/2013    Procedure: LEFT HEART CATHETERIZATION WITH CORONARY ANGIOGRAM;  Surgeon: [name], MD;  Location: [facility] C[facility];  Service: Cardiovascular;  Laterality: N/A;  . Percutaneous coronary stent intervention (pci-s)  05/19/2013    Procedure: PERCUTANEOUS CORONARY STENT INTERVENTION (PCI-S);  Surgeon: [name], MD;  Location: [facility] CATH LAB;  Service: Cardiovascular;;  . Left and right heart catheterization with coronary angiogram N/A 07/26/2013    Procedure: LEFT AND RIGHT HEART CATHETERIZATION WITH CORONARY  ANGIOGRAM;  Surgeon: [name], MD;  Location: [facility] CATH LAB;  Service: Cardiovascular;  Laterality: N/A;    Family History  Problem Relation Age of Onset  . Prostate cancer    . Kidney cancer    . Cancer      Bladder cancer  . Coronary artery disease    . Cancer Mother     intestinal   . Heart disease Mother   . Cancer Father     prostate  . Migraines Daughter   . Leukemia Sister   . Heart disease Sister     Allergies  Allergen Reactions  . Levemir [Insulin Detemir] Swelling    Patient had redness and swelling and tenderness at injection site.  . Codeine Other (See Comments)    Makes him "shakey", is OK with hydrocodone GI UPSET & TREMORS  . Lipitor [Atorvastatin] Other (See Comments)    Muscle aches; liver functions. Patient is currently taken  . Novolog Mix [Insulin Aspart Prot & Aspart]     Causes skin to swell/itch at injection site    Current Outpatient Prescriptions on File Prior to Visit  Medication Sig Dispense Refill  . acetaminophen (TYLENOL) 500 MG tablet Take 1,000 mg by mouth 2 (two) times daily.    Marland Kitchen albuterol (PROVENTIL HFA;VENTOLIN HFA) 108 (90 BASE) MCG/ACT inhaler Inhale 2 puffs into the lungs every 6 (six) hours as needed for wheezing or shortness of breath. 18 Inhaler 2  . Albuterol Sulfate (PROAIR RESPICLICK) 785 (90 BASE) MCG/ACT AEPB Inhale 2 puffs into the lungs every 4 (four) hours as needed. 1 each 3  . allopurinol (ZYLOPRIM) 300 MG tablet Take 1 tablet (300 mg total) by mouth daily. 90 tablet 1  . aspirin EC 81 MG tablet Take 81 mg by mouth daily.    Marland Kitchen atorvastatin (LIPITOR) 40 MG tablet TAKE 1 TABLET EVERY DAY 90 tablet 2  . BD INSULIN SYRINGE ULTRAFINE 31G X 5/16" 1 ML MISC Use to inject insulin 5 times daily as instructed. Dx: E11.9 450 each 3  . Blood Glucose Monitoring Suppl (BLOOD GLUCOSE METER) kit Use as instructed 1 each 0  . Blood Glucose Monitoring Suppl (TRUE METRIX AIR GLUCOSE METER) DEVI 1 each by Does not apply route daily. 1  Device 0  . Cholecalciferol (VITAMIN D) 1000 UNITS capsule Take 1,000 Units by mouth at bedtime.     . clopidogrel (PLAVIX) 75 MG tablet Take 1 tablet (75 mg total) by mouth daily with breakfast. 90 tablet 3  . fenofibrate 160 MG tablet Take 1 tablet (160 mg total) by mouth daily.  90 tablet 1  . fluocinonide cream (LIDEX) 1.19 % Apply 1 application topically daily as needed. FOR RASH    . fluticasone (FLONASE) 50 MCG/ACT nasal spray Place 2 sprays into both nostrils daily. (Patient taking differently: Place 2 sprays into both nostrils as needed. ) 16 g 1  . furosemide (LASIX) 80 MG tablet Take 1 tablet (80 mg total) by mouth daily. 90 tablet 1  . glucose blood (TRUE METRIX BLOOD GLUCOSE TEST) test strip Use to test blood sugar 4 times daily as instructed. Dx: E11.9 400 each 2  . glucose blood (TRUETEST TEST) test strip Use to test blood sugar 3 times daily as instructed. Dx code: E11.9 and Lancets 3/day 275 each 3  . insulin NPH Human (NOVOLIN N) 100 UNIT/ML injection Inject 35 units 2x a day (Patient taking differently: Inject 37 units 2x a day) 20 mL 2  . Insulin Pen Needle (PEN NEEDLES 31GX5/16") 31G X 8 MM MISC Use to inject insulin 2 times daily as instructed. 200 each 2  . insulin regular (NOVOLIN R RELION) 100 units/mL injection Inject 0.15-0.18 mLs (15-18 Units total) into the skin 3 (three) times daily before meals. (Patient taking differently: Inject 18 Units into the skin 3 (three) times daily before meals. ) 20 mL 2  . isosorbide mononitrate (IMDUR) 30 MG 24 hr tablet Take 1 tablet (30 mg total) by mouth daily. 90 tablet 1  . metoprolol (LOPRESSOR) 50 MG tablet Take 1 tablet (50 mg total) by mouth 2 (two) times daily. 180 tablet 1  . nitroGLYCERIN (NITROSTAT) 0.4 MG SL tablet Place 1 tablet (0.4 mg total) under the tongue every 5 (five) minutes as needed for chest pain. 25 tablet 3  . Omega-3 Fatty Acids (FISH OIL PO) Take by mouth daily.    . pantoprazole (PROTONIX) 20 MG tablet Take 20  mg by mouth daily.    Marland Kitchen sulfamethoxazole-trimethoprim (BACTRIM DS,SEPTRA DS) 800-160 MG per tablet Take 1 tablet by mouth 2 (two) times daily. 20 tablet 0  . tamsulosin (FLOMAX) 0.4 MG CAPS capsule Take 1 capsule (0.4 mg total) by mouth daily. 90 capsule 1  . TRUEPLUS LANCETS 28G MISC Use to test blood glucose 4 times daily as instructed. Dx code: E11.9 400 each 2  . oxybutynin (DITROPAN) 5 MG tablet Take 1 tablet by mouth 2 (two) times daily.     No current facility-administered medications on file prior to visit.    BP 121/59 mmHg  Pulse 70  Temp(Src) 98.1 F (36.7 C) (Oral)  Ht 5' 10.5" (1.791 m)  Wt 262 lb 12.8 oz (119.205 kg)  BMI 37.16 kg/m2  SpO2 96%       Objective:   Physical Exam  General- No acute distress. Pleasant patient. Neck- Full range of motion, no jvd Lungs- Clear, even and unlabored. Heart- regular rate and rhythm. Neurologic- CNII- XII grossly intact.   Rt lower ext- good posterior tibial and dorsalis pedis pulse. Good capillary refill Rt great toe- toe less swollen and red. Base of toe looked mild pink no longer has that appearance. Not warm. No dc to nail bed. On Inpsection no break down or ulcers of feet.     Assessment & Plan:    The toe does look better. No obvious infection. Use post op shoe next 3-4 days. Then if want to wear shoe use guaze or non stick dressing to reduce friction against toe. But daily check area and have periods of open to air.  Will go ahead  and refer to podiatrist and try to get you in within a week since total healing may take weeks to months. And want you under podiaty care in light of your diabetes.  Follow up here as needed  Can continue warm salt water soaks twice daily

## 2015-05-13 NOTE — Progress Notes (Signed)
Pre visit review using our clinic review tool, if applicable. No additional management support is needed unless otherwise documented below in the visit note. 

## 2015-05-14 ENCOUNTER — Telehealth: Payer: Self-pay | Admitting: Family Medicine

## 2015-05-14 NOTE — Telephone Encounter (Signed)
Pt called stating he has appt with podiatry, Dr. Caffie Pinto on 05/22/15 and they informed him to contact us for the auth/referral.

## 2015-05-21 ENCOUNTER — Other Ambulatory Visit: Payer: Self-pay | Admitting: Family Medicine

## 2015-05-21 ENCOUNTER — Other Ambulatory Visit: Payer: Self-pay | Admitting: Cardiology

## 2015-05-22 ENCOUNTER — Encounter: Payer: Self-pay | Admitting: Podiatry

## 2015-05-22 ENCOUNTER — Ambulatory Visit (INDEPENDENT_AMBULATORY_CARE_PROVIDER_SITE_OTHER): Payer: Commercial Managed Care - HMO | Admitting: Podiatry

## 2015-05-22 DIAGNOSIS — E1142 Type 2 diabetes mellitus with diabetic polyneuropathy: Secondary | ICD-10-CM

## 2015-05-22 DIAGNOSIS — IMO0002 Reserved for concepts with insufficient information to code with codable children: Secondary | ICD-10-CM | POA: Insufficient documentation

## 2015-05-22 DIAGNOSIS — S90413A Abrasion, unspecified great toe, initial encounter: Secondary | ICD-10-CM

## 2015-05-22 DIAGNOSIS — S6981XA Other specified injuries of right wrist, hand and finger(s), initial encounter: Secondary | ICD-10-CM

## 2015-05-22 DIAGNOSIS — E1140 Type 2 diabetes mellitus with diabetic neuropathy, unspecified: Secondary | ICD-10-CM | POA: Insufficient documentation

## 2015-05-22 NOTE — Patient Instructions (Signed)
Seen for injured nails. Healed well following the infection.  All nails debrided. Return in 3 months or as needed.

## 2015-05-22 NOTE — Progress Notes (Signed)
Subjective: 74 year old male accompanied by his wife presents stating that he had been to ER in [place] two and a half weeks ago for infected nail on right toe. He was treated and feel better now. His PCP wanted him to be checked out and referred him to this office.  Diabetic x 5-6 years. Has Neuropathy (x 5 years) and can't feel toes.   Objective: Dermatologic: Irregular uneven nail bed, clean and dry without infection or drainage right great toe. Vascular: Pedal pulses are palpable. No edema or erythema noted. Neurologic: Failed response to Monofilament sensory testing and Vibratory sensory testing on both feet. Orthopedic: No gross deformities other than mild hyperextended right hallux in resting position.  Assessment: Recent history of nail injury and infection possible due to Microtrauma from Hyper extended hallux.  Well healed nail right great toe following recent infection. Mild hypertrophic nails x 10. Diabetic Neuropathy.  Plan:  Reviewed findings and available options. All nails debrided.

## 2015-05-28 ENCOUNTER — Encounter: Payer: Self-pay | Admitting: Internal Medicine

## 2015-05-28 ENCOUNTER — Ambulatory Visit (INDEPENDENT_AMBULATORY_CARE_PROVIDER_SITE_OTHER): Payer: Commercial Managed Care - HMO | Admitting: Internal Medicine

## 2015-05-28 VITALS — BP 122/62 | HR 74 | Temp 98.0°F | Resp 12 | Wt 263.8 lb

## 2015-05-28 DIAGNOSIS — E1159 Type 2 diabetes mellitus with other circulatory complications: Secondary | ICD-10-CM | POA: Diagnosis not present

## 2015-05-28 NOTE — Patient Instructions (Signed)
Please continue: ReliOn insulin: Insulin Before breakfast Before lunch Before dinner  Regular 16 - small meal 20 - large meal 16 - small meal 20- large meal 16 - small meal 20 - large meal  NPH 38  38   Please return in 3 months with your sugar log.

## 2015-05-28 NOTE — Progress Notes (Signed)
Patient ID: William Hendrix, Hendrix   DOB: 1940-12-25, 74 y.o.   MRN: [id number]  HPI: William Hendrix, returning for f/u for DM2, dx 2009, insulin-dependent, uncontrolled, with complications (CAD, CKD stage 3). He is here with his wife who offers part of the history. Last visit 8 mo ago.  Last hemoglobin A1c was: Lab Results  Component Value Date   HGBA1C 8.3* 03/28/2015   HGBA1C 10.5* 11/05/2014   HGBA1C 9.5* 08/02/2014   Pt was on a regimen of Relion Novolin 70/30:  - 40 units before breakfast - 15 units before lunch - 30 units before dinner  At last visit we changed to: ReliOn insulin: Insulin Before breakfast Before lunch Before dinner  Regular  20 - large meal  20- large meal  20 - large meal  NPH 38  38   We cannot use Metformin 2/2 CKD. We stopped Januvia at last visit >> could not afford ($100/3 mo)  We cannot use Levemir >> developed a severe skin reaction to Levemir  We stopped Amaryl 4 mg.  He brings his log. He checks 3-4 times a day >> sugars are much better: - am: 169-228 >> 151-192 >> 145-176  >> 200-280 >> 152-220 >> 95-162 - Before lunch 132-295 >> 137-220 >> 85-147 >> 219-292 >> 189-260 >> 77, 82-167 - before dinner 124-194 >> 95-200 >> 89-175 >> 191-378 >> 89, 156-299, 394 >> 65, 71, 109-148, 168 - Before bedtime, highest sugars at that day, 175-240 >> 157-179 >> 102 >> n/c No lows. Lowest sugar was 150 >> 65,  he does not know whether he has hypoglycemia awareness. Highest sugar was 300s >> 180.  Pt has chronic kidney disease, last BUN/creatinine was:  Lab Results  Component Value Date   BUN 27* 03/28/2015   CREATININE 1.42 03/28/2015  ACR 0.7. Last lipids: Lab Results  Component Value Date   CHOL 122 03/28/2015   HDL 24.50* 03/28/2015   LDLCALC 31 04/09/2014   LDLDIRECT 56.0 03/28/2015   TRIG 273.0* 03/28/2015   CHOLHDL 5 03/28/2015   Pt's last eye exam was 3 years ago. No DR. Had cataract sx 5-6 years ago.  Denies  numbness and tingling in his legs.  PMH: Pt also also has a history of CAD, sees Dr. Ron Parker (but will change Dr's soon as he retires), hypertension, hyperlipidemia, chronic kidney disease stage III, history of nephrolithiasis, obesity, hiatal hernia, obstructive sleep apnea, hyperglyceridemia, melanoma, spinal stenosis.  ROS: Constitutional: no  weight gain, no fatigue, no subjective hyperthermia/hypothermia Eyes: no blurry vision, no xerophthalmia ENT: no sore throat, no nodules palpated in throat, no dysphagia/odynophagia, no hoarseness Cardiovascular: no CP/+ SOB/no palpitations/+ leg swelling Respiratory: no cough/+ SOB Gastrointestinal: no N/V/D/C Musculoskeletal: no muscle/joint aches Skin: no rashes  I reviewed pt's medications, allergies, PMH, social hx, family hx, and changes were documented in the history of present illness. Otherwise, unchanged from my initial visit note.  PE: BP 122/62 mmHg  Pulse 74  Temp(Src) 98 F (36.7 C) (Oral)  Resp 12  Wt 263 lb 12.8 oz (119.659 kg)  SpO2 95% Wt Readings from Last 3 Encounters:  05/28/15 263 lb 12.8 oz (119.659 kg)  05/13/15 262 lb 12.8 oz (119.205 kg)  05/09/15 259 lb 9.6 oz (117.754 kg)  Constitutional: overweight, in NAD Eyes: PERRLA, EOMI, no exophthalmos ENT: moist mucous membranes, no thyromegaly, no cervical lymphadenopathy Cardiovascular: RRR, No MRG Respiratory: CTA B Gastrointestinal: abdomen soft, NT, ND, BS+ Musculoskeletal: no deformities, strength  intact in all 4 Skin: moist, warm, no rashes  ASSESSMENT: 1. DM2, insulin-dependent, uncontrolled, with complications - CAD, s/p stents - Dr. Ron Parker.  He had stenting of distal RCA/PDA in 07/2013. Prior stenting of LAD in 04/2013. - CKD stage 3  PLAN:  1. Patient with improving diabetes control after switching to basal-bolus insulin regimen. HbA1c has decreased nicely at last check in 03/2015. Will continue current regimen, but advised him to take less R insulin if  he has a smaller meal as he may have lows occasionally.  Patient Instructions   Please continue: ReliOn insulin: Insulin Before breakfast Before lunch Before dinner  Regular 16 - small meal 20 - large meal 16 - small meal 20- large meal 16 - small meal 20 - large meal  NPH 38  38   Please return in 3 months with your sugar log.   - will check a HbA1c at next visit with me or PCP - I will see the patient back in 3 mo with his sugar log

## 2015-06-13 ENCOUNTER — Ambulatory Visit (INDEPENDENT_AMBULATORY_CARE_PROVIDER_SITE_OTHER): Payer: Commercial Managed Care - HMO | Admitting: Pulmonary Disease

## 2015-06-13 ENCOUNTER — Encounter: Payer: Self-pay | Admitting: Pulmonary Disease

## 2015-06-13 VITALS — BP 124/69 | HR 74 | Temp 98.2°F | Ht 70.5 in | Wt 259.0 lb

## 2015-06-13 DIAGNOSIS — G4733 Obstructive sleep apnea (adult) (pediatric): Secondary | ICD-10-CM

## 2015-06-13 NOTE — Patient Instructions (Signed)
Your CPAP is set at 15 cm CPAP supplies will be renewed x 1 year

## 2015-06-13 NOTE — Progress Notes (Signed)
   Subjective:    Patient ID: William Hendrix, male    DOB: 1941-09-08, 74 y.o.   MRN: [id number]  HPI    Review of Systems  Constitutional: Negative for fever, chills, activity change, appetite change and unexpected weight change.  HENT: Negative for congestion, dental problem, postnasal drip, rhinorrhea, sneezing, sore throat, trouble swallowing and voice change.   Eyes: Negative for visual disturbance.  Respiratory: Negative for cough, choking and shortness of breath.   Cardiovascular: Negative for chest pain and leg swelling.  Gastrointestinal: Negative for nausea, vomiting and abdominal pain.  Genitourinary: Negative for difficulty urinating.  Musculoskeletal: Negative for arthralgias.  Skin: Negative for rash.  Psychiatric/Behavioral: Negative for behavioral problems and confusion.       Objective:   Physical Exam        Assessment & Plan:

## 2015-06-13 NOTE — Assessment & Plan Note (Signed)
Your CPAP is set at 15 cm CPAP supplies will be renewed x 1 year Weight loss encouraged, compliance with goal of at least 4-6 hrs every night is the expectation. Advised against medications with sedative side effects Cautioned against driving when sleepy - understanding that sleepiness will vary on a day to day basis

## 2015-06-13 NOTE — Progress Notes (Signed)
   Subjective:    Patient ID: William Hendrix, male    DOB: Aug 05, 1941, 74 y.o.   MRN: [id number]  HPI  VS pt, last seen 09/2012  - given new CPAP 75/M  Retired Curator with mod OSA  His current CPAP machine is from 2013  Chief Complaint  Patient presents with  . Sleep Consult    Referred by Dr. Randel Pigg; wears cpap 15cm; epworth score: 12; no complaints   Mask ok, pr ok, uses nasal pillows No dryness  sleeps 6h from [place] to 6 am - compliant, wakes up rested  Returned from a trip to [facility] unchanged   TESTS: PSG 07/24/99 >> AHI 24, SpO2 73% CPAP 07/22/15 >> CPAP 15 cm H2O CPAP 09/14/10 to 09/28/10>>used on 14 of 14 nights with average 6 hrs 47 min. Average AHI 0.2 with CPAP 15 cm H2O CPAP 10/10/12 to 10/23/12 >> Used on 14 of 14 nights with average 8 hrs 4 min. Average AHI 0.5 with CPAP 15 cm H2O.   Review of Systems  neg for any significant sore throat, dysphagia, itching, sneezing, nasal congestion or excess/ purulent secretions, fever, chills, sweats, unintended wt loss, pleuritic or exertional cp, hempoptysis, orthopnea pnd or change in chronic leg swelling. Also denies presyncope, palpitations, heartburn, abdominal pain, nausea, vomiting, diarrhea or change in bowel or urinary habits, dysuria,hematuria, rash, arthralgias, visual complaints, headache, numbness weakness or ataxia.     Objective:   Physical Exam  Gen. Pleasant, obese, in no distress ENT - no lesions, no post nasal drip Neck: No JVD, no thyromegaly, no carotid bruits Lungs: no use of accessory muscles, no dullness to percussion, decreased without rales or rhonchi  Cardiovascular: Rhythm regular, heart sounds  normal, no murmurs or gallops, no peripheral edema Musculoskeletal: No deformities, no cyanosis or clubbing , no tremors       Assessment & Plan:

## 2015-06-14 ENCOUNTER — Other Ambulatory Visit: Payer: Self-pay | Admitting: Family Medicine

## 2015-06-27 ENCOUNTER — Telehealth: Payer: Self-pay | Admitting: Family Medicine

## 2015-06-27 NOTE — Telephone Encounter (Signed)
Pt wife called in upset that they received a denial from [facility] for [place] 05/28/15 with Dr. [name]. She said that we were supposed to enter referral. It looks like we entered it but that the referral was cancelled but [facility] stating pt already existing pt with Dr. Cruzita Lederer. Pt wife is asking if the referral/auth can be entered again and back dated. She said this happened before.

## 2015-07-03 ENCOUNTER — Encounter: Payer: Self-pay | Admitting: Cardiology

## 2015-07-03 ENCOUNTER — Ambulatory Visit (INDEPENDENT_AMBULATORY_CARE_PROVIDER_SITE_OTHER): Payer: Commercial Managed Care - HMO | Admitting: Cardiology

## 2015-07-03 VITALS — BP 118/72 | HR 74 | Ht 70.5 in | Wt 261.0 lb

## 2015-07-03 DIAGNOSIS — I25118 Atherosclerotic heart disease of native coronary artery with other forms of angina pectoris: Secondary | ICD-10-CM

## 2015-07-03 DIAGNOSIS — I714 Abdominal aortic aneurysm, without rupture, unspecified: Secondary | ICD-10-CM

## 2015-07-03 DIAGNOSIS — R0602 Shortness of breath: Secondary | ICD-10-CM | POA: Diagnosis not present

## 2015-07-03 DIAGNOSIS — E785 Hyperlipidemia, unspecified: Secondary | ICD-10-CM

## 2015-07-03 NOTE — Assessment & Plan Note (Signed)
The patient has had several stents over the years. In October, 2016 he received a drug-eluting stent to the distal right coronary artery extending into the ostial right PDA. His LAD stent was widely patent. He had normal filling pressures. I have chosen to keep him on aspirin and Plavix for a longer period based on all of his prior disease. At some point it may be appropriate to stop his Plavix.

## 2015-07-03 NOTE — Assessment & Plan Note (Signed)
The patient is on guideline directed therapy. No change in therapy.

## 2015-07-03 NOTE — Assessment & Plan Note (Signed)
It was noted that he has an infrarenal dilatation of his aorta in the range of 3.2 cm. This was noted when he had an abdominal CT for diverticulitis. He will need a follow-up CT around 2019

## 2015-07-03 NOTE — Progress Notes (Signed)
Cardiology Office Note   Date:  07/03/2015   ID:  William Hendrix, DOB [birth year], MRN [id number]  PCP:  Penni Homans, MD  Cardiologist:  Dola Argyle, MD   Chief Complaint  Patient presents with  . Appointment    Follow-up coronary disease      History of Present Illness: William Hendrix is a 74 y.o. male who presents today to follow-up coronary disease. He actually is doing very well. He had a coronary intervention in 2014. He had an episode of diverticulitis in March, 2016. He has not been having any significant chest pain. He is going about full activities.    Past Medical History  Diagnosis Date  . Obesity   . Edema   . DM (diabetes mellitus)   . Fatigue   . Gout   . Skin cancer   . CAD (coronary artery disease)     a. Stent 2000. b. Several caths since then with nonobst dz. c. 05/2013: IVUS-guided PTCA/DES to proximal-to-mid LAD, residual PDA disease for med rx unless recurrent angina. EF 55-60%.  . Ventral hernia   . Sinusitis   . Back problem   . Hyperlipidemia   . HTN (hypertension)   . Ejection fraction     55%, 07/2010, mild inferior hypo  . SOB (shortness of breath)   . Dizziness     Mild positional dizziness May, 2012  . History of pneumonia   . Melanoma     Melanoma removed from the left forearm with wide excision May, 2012  . Pneumonia     hx x4 young  . Sleep apnea     CPAP,  [name] since 2000  . GERD (gastroesophageal reflux disease)   . H/O hiatal hernia   . Arthritis   . Spinal stenosis     a. s/p surgical repair 2013  . Positive D-dimer     a. significant elevation ,hospital 07/2010, etiology unclear. b. D Dimer chronically > 20.  Marland Kitchen CKD (chronic kidney disease)      CKD stage III.. creatinine up to 1.5 in hospital after dye in October, 2011  . Nephrolithiasis   . CHF (congestive heart failure)   . AAA (abdominal aortic aneurysm) 12-15-14    Mild aneurysmal dilatation of the infrarenal abdominal aorta 3.2 cm   . H/O diverticulitis of  colon 01/31/2015    Past Surgical History  Procedure Laterality Date  . Nose surgery    . Cardiac catheterization    . Coronary stent placement  2000    CAD  . Knee arthroscopy  90's    rt  . Cervical disc surgery  90's  . Lumbar laminectomy/decompression microdiscectomy  08/30/2012    Procedure: LUMBAR LAMINECTOMY/DECOMPRESSION MICRODISCECTOMY 2 LEVELS;  Surgeon: [name], MD;  Location: [place] [facility];  Service: Neurosurgery;  Laterality: Bilateral;  Bilateral Lumbar three-four Lumbar four-five Laminotomies  . Back surgery    . Coronary angioplasty with stent placement  07/26/2013    DES to RCA extending to PDA    . Left heart catheterization with coronary angiogram N/A 05/19/2013    Procedure: LEFT HEART CATHETERIZATION WITH CORONARY ANGIOGRAM;  Surgeon: [name], MD;  Location: [facility] CATH LAB;  Service: Cardiovascular;  Laterality: N/A;  . Percutaneous coronary stent intervention (pci-s)  05/19/2013    Procedure: PERCUTANEOUS CORONARY STENT INTERVENTION (PCI-S);  Surgeon: [name], MD;  Location: [facility] CATH LAB;  Service: Cardiovascular;;  . Left and right heart catheterization with coronary angiogram N/A 07/26/2013  Procedure: LEFT AND RIGHT HEART CATHETERIZATION WITH CORONARY ANGIOGRAM;  Surgeon: [name], MD;  Location: [place] CATH LAB;  Service: Cardiovascular;  Laterality: N/A;    Patient Active Problem List   Diagnosis Date Noted  . Dizziness     Priority: High  . CAD (coronary artery disease)     Priority: High  . Ejection fraction     Priority: High  . SOB (shortness of breath)     Priority: High  . Nail bed injury 05/22/2015  . Neuropathy, diabetic 05/22/2015  . H/O diverticulitis of colon 01/31/2015  . AAA (abdominal aortic aneurysm) 01/01/2015  . Type 2 diabetes mellitus with other circulatory complications 97/67/3419  . Sinusitis 10/10/2014  . Hip pain, bilateral 07/05/2014  . Palpitations 02/27/2014  . Claudication of lower extremity 10/25/2013    . BPH with obstruction/lower urinary tract symptoms 10/25/2013  . Hypertriglyceridemia 08/15/2012  . Spinal stenosis of lumbar region at multiple levels 08/10/2012  . Melanoma   . Renal insufficiency   . Hyperlipidemia   . HTN (hypertension)   . OSA (obstructive sleep apnea)   . PARESTHESIA 05/30/2009  . Overweight 01/22/2009  . EDEMA 01/22/2009  . Diabetes mellitus type 2 in obese 04/25/2008  . OTHER ABNORMAL BLOOD CHEMISTRY 04/25/2008  . Gout 10/21/2007  . DEPRESSION 10/21/2007  . VENTRAL HERNIA 10/21/2007  . HIATAL HERNIA 10/21/2007  . FATIGUE 10/21/2007  . SKIN CANCER, HX OF 10/21/2007  . NEPHROLITHIASIS, HX OF 10/21/2007      Current Outpatient Prescriptions  Medication Sig Dispense Refill  . acetaminophen (TYLENOL) 500 MG tablet Take 1,000 mg by mouth 2 (two) times daily.    Marland Kitchen albuterol (PROVENTIL HFA;VENTOLIN HFA) 108 (90 BASE) MCG/ACT inhaler Inhale 2 puffs into the lungs every 6 (six) hours as needed for wheezing or shortness of breath. 18 Inhaler 2  . Albuterol Sulfate (PROAIR RESPICLICK) 379 (90 BASE) MCG/ACT AEPB Inhale 2 puffs into the lungs every 4 (four) hours as needed. 1 each 3  . allopurinol (ZYLOPRIM) 300 MG tablet TAKE 1 TABLET (300 MG TOTAL) BY MOUTH DAILY. 90 tablet 1  . aspirin EC 81 MG tablet Take 81 mg by mouth daily.    Marland Kitchen atorvastatin (LIPITOR) 40 MG tablet TAKE 1 TABLET EVERY DAY 90 tablet 2  . BD INSULIN SYRINGE ULTRAFINE 31G X 5/16" 1 ML MISC Use to inject insulin 5 times daily as instructed. Dx: E11.9 450 each 3  . Blood Glucose Monitoring Suppl (BLOOD GLUCOSE METER) kit Use as instructed 1 each 0  . Blood Glucose Monitoring Suppl (TRUE METRIX AIR GLUCOSE METER) DEVI 1 each by Does not apply route daily. 1 Device 0  . Cholecalciferol (VITAMIN D) 1000 UNITS capsule Take 1,000 Units by mouth at bedtime.     . clopidogrel (PLAVIX) 75 MG tablet TAKE 1 TABLET EVERY DAY WITH BREAKFAST 90 tablet 0  . fenofibrate 160 MG tablet Take 1 tablet (160 mg total)  by mouth daily. 90 tablet 1  . fluocinonide cream (LIDEX) 0.24 % Apply 1 application topically daily as needed. FOR RASH    . fluticasone (FLONASE) 50 MCG/ACT nasal spray Place 2 sprays into both nostrils daily. (Patient taking differently: Place 2 sprays into both nostrils as needed. ) 16 g 1  . furosemide (LASIX) 80 MG tablet Take 1 tablet (80 mg total) by mouth daily. 90 tablet 1  . glucose blood (TRUE METRIX BLOOD GLUCOSE TEST) test strip Use to test blood sugar 4 times daily as instructed. Dx: E11.9 400  each 2  . glucose blood (TRUETEST TEST) test strip Use to test blood sugar 3 times daily as instructed. Dx code: E11.9 and Lancets 3/day 275 each 3  . insulin NPH Human (NOVOLIN N) 100 UNIT/ML injection Inject 35 units 2x a day (Patient taking differently: Inject 38 Units into the skin 2 (two) times daily before a meal. Inject 35 units 2x a day) 20 mL 2  . Insulin Pen Needle (PEN NEEDLES 31GX5/16") 31G X 8 MM MISC Use to inject insulin 2 times daily as instructed. 200 each 2  . insulin regular (NOVOLIN R RELION) 100 units/mL injection Inject 0.15-0.18 mLs (15-18 Units total) into the skin 3 (three) times daily before meals. (Patient taking differently: Inject 20 Units into the skin 3 (three) times daily before meals. ) 20 mL 2  . isosorbide mononitrate (IMDUR) 30 MG 24 hr tablet Take 1 tablet (30 mg total) by mouth daily. 90 tablet 1  . metoprolol (LOPRESSOR) 50 MG tablet Take 1 tablet (50 mg total) by mouth 2 (two) times daily. 180 tablet 1  . nitroGLYCERIN (NITROSTAT) 0.4 MG SL tablet Place 1 tablet (0.4 mg total) under the tongue every 5 (five) minutes as needed for chest pain. 25 tablet 3  . Omega-3 Fatty Acids (FISH OIL PO) Take by mouth daily.    Marland Kitchen oxybutynin (DITROPAN) 5 MG tablet Take 1 tablet by mouth 2 (two) times daily.    . pantoprazole (PROTONIX) 20 MG tablet Take 20 mg by mouth daily.    . tamsulosin (FLOMAX) 0.4 MG CAPS capsule TAKE 1 CAPSULE EVERY DAY 90 capsule 1  . TRUEPLUS  LANCETS 28G MISC Use to test blood glucose 4 times daily as instructed. Dx code: E11.9 400 each 2   No current facility-administered medications for this visit.    Allergies:   Levemir; Codeine; Lipitor; and Novolog mix    Social History:  The patient  reports that he quit smoking about 18 years ago. His smoking use included Cigarettes. He has a 90 pack-year smoking history. He has never used smokeless tobacco. He reports that he does not drink alcohol or use illicit drugs.   Family History:  The patient's family history includes Cancer in his father, mother, and another family member; Coronary artery disease in an other family member; Heart disease in his mother and sister; Hypertension in his brother and sister; Kidney cancer in an other family member; Leukemia in his sister; Migraines in his daughter; Prostate cancer in an other family member. There is no history of Heart attack or Stroke.    ROS:  Please see the history of present illness.   Patient denies fever, chills, headache, sweats, rash, change in vision, change in hearing, chest pain, cough, nausea or vomiting, urinary symptoms. All other systems are reviewed and are negative.     PHYSICAL EXAM: VS:  BP 118/72 mmHg  Pulse 74  Ht 5' 10.5" (1.791 m)  Wt 261 lb (118.389 kg)  BMI 36.91 kg/m2 , Patient is overweight but stable. He is oriented to person time and place. Affect is normal. He is here with his wife. Head is atraumatic. Sclera and conjunctiva are normal. There is no jugular venous distention. Lungs are clear. Respiratory effort is not labored. Cardiac exam reveals S1 and S2. Abdomen is protuberant but soft. There is no peripheral edema. There are no musculoskeletal deformities. There are no skin rashes. Neurologic is grossly intact.   EKG:   EKG is not done today.   Recent Labs:  03/28/2015: ALT 24; BUN 27*; Creatinine, Ser 1.42; Hemoglobin 14.5; Platelets 187.0; Potassium 3.5; Sodium 138; TSH 1.47    Lipid Panel     Component Value Date/Time   CHOL 122 03/28/2015 0802   TRIG 273.0* 03/28/2015 0802   TRIG 203* 09/17/2006 0812   HDL 24.50* 03/28/2015 0802   CHOLHDL 5 03/28/2015 0802   CHOLHDL 5.7 CALC 09/17/2006 0812   VLDL 54.6* 03/28/2015 0802   LDLCALC 31 04/09/2014 0858   LDLDIRECT 56.0 03/28/2015 0802   LDLDIRECT 95.3 09/17/2006 0812      Wt Readings from Last 3 Encounters:  07/03/15 261 lb (118.389 kg)  06/13/15 259 lb (117.482 kg)  05/28/15 263 lb 12.8 oz (119.659 kg)      Current medicines are reviewed  The patient understands his medications.     ASSESSMENT AND PLAN:

## 2015-07-03 NOTE — Patient Instructions (Signed)
Medication Instructions:  Same-no changes  Labwork: None  Testing/Procedures: None  Follow-Up: Your physician wants you to follow-up in: 6 months with Dr Stanford Breed in the [facility] office. You will receive a reminder letter in the mail two months in advance. If you don't receive a letter, please call our office to schedule the follow-up appointment.

## 2015-07-03 NOTE — Assessment & Plan Note (Signed)
The patient has some exertional shortness of breath. This has not changed. He has some mild restrictive changes on PFTs. Obesity plays a role. He is stable. No further workup.

## 2015-07-04 ENCOUNTER — Other Ambulatory Visit (INDEPENDENT_AMBULATORY_CARE_PROVIDER_SITE_OTHER): Payer: Commercial Managed Care - HMO

## 2015-07-04 ENCOUNTER — Encounter: Payer: Self-pay | Admitting: Gastroenterology

## 2015-07-04 DIAGNOSIS — I10 Essential (primary) hypertension: Secondary | ICD-10-CM

## 2015-07-04 DIAGNOSIS — E669 Obesity, unspecified: Secondary | ICD-10-CM

## 2015-07-04 DIAGNOSIS — E119 Type 2 diabetes mellitus without complications: Secondary | ICD-10-CM | POA: Diagnosis not present

## 2015-07-04 DIAGNOSIS — E785 Hyperlipidemia, unspecified: Secondary | ICD-10-CM

## 2015-07-04 DIAGNOSIS — E1169 Type 2 diabetes mellitus with other specified complication: Secondary | ICD-10-CM

## 2015-07-04 LAB — COMPREHENSIVE METABOLIC PANEL
ALT: 23 U/L (ref 0–53)
AST: 20 U/L (ref 0–37)
Albumin: 4.1 g/dL (ref 3.5–5.2)
Alkaline Phosphatase: 47 U/L (ref 39–117)
BUN: 30 mg/dL — AB (ref 6–23)
CHLORIDE: 104 meq/L (ref 96–112)
CO2: 29 mEq/L (ref 19–32)
Calcium: 9.9 mg/dL (ref 8.4–10.5)
Creatinine, Ser: 1.81 mg/dL — ABNORMAL HIGH (ref 0.40–1.50)
GFR: 39.01 mL/min — AB (ref >60.00)
GLUCOSE: 174 mg/dL — AB (ref 70–99)
POTASSIUM: 3.7 meq/L (ref 3.5–5.1)
SODIUM: 141 meq/L (ref 135–145)
Total Bilirubin: 0.5 mg/dL (ref 0.2–1.2)
Total Protein: 7 g/dL (ref 6.0–8.3)

## 2015-07-04 LAB — LIPID PANEL
CHOL/HDL RATIO: 5
Cholesterol: 121 mg/dL (ref 0–200)
HDL: 22.7 mg/dL — AB (ref >39.00)
NonHDL: 98.75
Triglycerides: 306 mg/dL — ABNORMAL HIGH (ref 0.0–149.0)
VLDL: 61.2 mg/dL — ABNORMAL HIGH (ref 0.0–40.0)

## 2015-07-04 LAB — HEMOGLOBIN A1C: HEMOGLOBIN A1C: 7.8 % — AB (ref 4.6–6.5)

## 2015-07-04 LAB — TSH: TSH: 3.47 u[IU]/mL (ref 0.35–4.50)

## 2015-07-04 LAB — CBC
HCT: 40.7 % (ref 39.0–52.0)
Hemoglobin: 14 g/dL (ref 13.0–17.0)
MCHC: 34.4 g/dL (ref 30.0–36.0)
MCV: 92.1 fl (ref 78.0–100.0)
Platelets: 170 10*3/uL (ref 150.0–400.0)
RBC: 4.42 Mil/uL (ref 4.22–5.81)
RDW: 13.4 % (ref 11.5–15.5)
WBC: 6.5 10*3/uL (ref 4.0–10.5)

## 2015-07-04 LAB — LDL CHOLESTEROL, DIRECT: LDL DIRECT: 55 mg/dL

## 2015-07-11 ENCOUNTER — Ambulatory Visit: Payer: Commercial Managed Care - HMO | Admitting: Family Medicine

## 2015-07-11 ENCOUNTER — Ambulatory Visit (INDEPENDENT_AMBULATORY_CARE_PROVIDER_SITE_OTHER): Payer: Commercial Managed Care - HMO | Admitting: Family Medicine

## 2015-07-11 ENCOUNTER — Encounter: Payer: Self-pay | Admitting: Family Medicine

## 2015-07-11 VITALS — BP 116/74 | HR 78 | Temp 98.2°F | Ht 70.5 in | Wt 262.0 lb

## 2015-07-11 DIAGNOSIS — E1165 Type 2 diabetes mellitus with hyperglycemia: Secondary | ICD-10-CM | POA: Diagnosis not present

## 2015-07-11 DIAGNOSIS — Z23 Encounter for immunization: Secondary | ICD-10-CM | POA: Diagnosis not present

## 2015-07-11 DIAGNOSIS — IMO0002 Reserved for concepts with insufficient information to code with codable children: Secondary | ICD-10-CM

## 2015-07-11 DIAGNOSIS — E1159 Type 2 diabetes mellitus with other circulatory complications: Secondary | ICD-10-CM

## 2015-07-11 DIAGNOSIS — N289 Disorder of kidney and ureter, unspecified: Secondary | ICD-10-CM | POA: Diagnosis not present

## 2015-07-11 MED ORDER — INSULIN NPH (HUMAN) (ISOPHANE) 100 UNIT/ML ~~LOC~~ SUSP: SUBCUTANEOUS | Status: DC

## 2015-07-11 NOTE — Progress Notes (Signed)
Pre visit review using our clinic review tool, if applicable. No additional management support is needed unless otherwise documented below in the visit note. 

## 2015-07-11 NOTE — Assessment & Plan Note (Signed)
Pt  Is not taking insulin as rx by endo Reviewed instructions with pt  He understands  F/u endo

## 2015-07-11 NOTE — Patient Instructions (Signed)

## 2015-07-11 NOTE — Progress Notes (Signed)
Patient ID: William Hendrix, male   DOB: [birth year], 74 y.o.   MRN: [id number]   Subjective:    Patient ID: William Hendrix, male    DOB: 05/08/1941, 74 y.o.   MRN: [id number]  Chief Complaint  Patient presents with  . Follow-up    blood work     HPI Patient is in today f/u labs.  He needs kidney function repeated since lowering lasix and hgba1c is still elevated-- pt has app with endo in Nov.  No other complaints.  Past Medical History  Diagnosis Date  . Obesity   . Edema   . DM (diabetes mellitus)   . Fatigue   . Gout   . Skin cancer   . CAD (coronary artery disease)     a. Stent 2000. b. Several caths since then with nonobst dz. c. 05/2013: IVUS-guided PTCA/DES to proximal-to-mid LAD, residual PDA disease for med rx unless recurrent angina. EF 55-60%.  . Ventral hernia   . Sinusitis   . Back problem   . Hyperlipidemia   . HTN (hypertension)   . Ejection fraction     55%, 07/2010, mild inferior hypo  . SOB (shortness of breath)   . Dizziness     Mild positional dizziness May, 2012  . History of pneumonia   . Melanoma     Melanoma removed from the left forearm with wide excision May, 2012  . Pneumonia     hx x4 young  . Sleep apnea     CPAP,  [name] since 2000  . GERD (gastroesophageal reflux disease)   . H/O hiatal hernia   . Arthritis   . Spinal stenosis     a. s/p surgical repair 2013  . Positive D-dimer     a. significant elevation ,hospital 07/2010, etiology unclear. b. D Dimer chronically > 20.  Marland Kitchen CKD (chronic kidney disease)      CKD stage III.. creatinine up to 1.5 in hospital after dye in October, 2011  . Nephrolithiasis   . CHF (congestive heart failure)   . AAA (abdominal aortic aneurysm) 12-15-14    Mild aneurysmal dilatation of the infrarenal abdominal aorta 3.2 cm   . H/O diverticulitis of colon 01/31/2015    Past Surgical History  Procedure Laterality Date  . Nose surgery    . Cardiac catheterization    . Coronary stent placement  2000    CAD    . Knee arthroscopy  90's    rt  . Cervical disc surgery  90's  . Lumbar laminectomy/decompression microdiscectomy  08/30/2012    Procedure: LUMBAR LAMINECTOMY/DECOMPRESSION MICRODISCECTOMY 2 LEVELS;  Surgeon: [name], MD;  Location: [place] [facility];  Service: Neurosurgery;  Laterality: Bilateral;  Bilateral Lumbar three-four Lumbar four-five Laminotomies  . Back surgery    . Coronary angioplasty with stent placement  07/26/2013    DES to RCA extending to PDA    . Left heart catheterization with coronary angiogram N/A 05/19/2013    Procedure: LEFT HEART CATHETERIZATION WITH CORONARY ANGIOGRAM;  Surgeon: [name], MD;  Location: [facility] CATH LAB;  Service: Cardiovascular;  Laterality: N/A;  . Percutaneous coronary stent intervention (pci-s)  05/19/2013    Procedure: PERCUTANEOUS CORONARY STENT INTERVENTION (PCI-S);  Surgeon: [name], MD;  Location: [facility] CATH LAB;  Service: Cardiovascular;;  . Left and right heart catheterization with coronary angiogram N/A 07/26/2013    Procedure: LEFT AND RIGHT HEART CATHETERIZATION WITH CORONARY ANGIOGRAM;  Surgeon: [name], MD;  Location: [facility]  CATH LAB;  Service: Cardiovascular;  Laterality: N/A;    Family History  Problem Relation Age of Onset  . Prostate cancer    . Kidney cancer    . Cancer      Bladder cancer  . Coronary artery disease    . Cancer Mother     intestinal   . Heart disease Mother   . Cancer Father     prostate  . Migraines Daughter   . Leukemia Sister   . Heart disease Sister   . Heart attack Neg Hx   . Stroke Neg Hx   . Hypertension Sister   . Hypertension Brother     Social History   Social History  . Marital Status: Married    Spouse Name: N/A  . Number of Children: N/A  . Years of Education: N/A   Occupational History  . [employer]    Social History Main Topics  . Smoking status: Former Smoker -- 3.00 packs/day for 30 years    Types: Cigarettes    Quit date: 09/17/1996  . Smokeless tobacco: Never  Used  . Alcohol Use: No  . Drug Use: No  . Sexual Activity: Not Currently    Birth Control/ Protection: None   Other Topics Concern  . Not on file   Social History Narrative   Married and lives locally with his wife.  [employer].    Outpatient Prescriptions Prior to Visit  Medication Sig Dispense Refill  . acetaminophen (TYLENOL) 500 MG tablet Take 1,000 mg by mouth 2 (two) times daily.    Marland Kitchen albuterol (PROVENTIL HFA;VENTOLIN HFA) 108 (90 BASE) MCG/ACT inhaler Inhale 2 puffs into the lungs every 6 (six) hours as needed for wheezing or shortness of breath. 18 Inhaler 2  . Albuterol Sulfate (PROAIR RESPICLICK) 338 (90 BASE) MCG/ACT AEPB Inhale 2 puffs into the lungs every 4 (four) hours as needed. 1 each 3  . allopurinol (ZYLOPRIM) 300 MG tablet TAKE 1 TABLET (300 MG TOTAL) BY MOUTH DAILY. 90 tablet 1  . aspirin EC 81 MG tablet Take 81 mg by mouth daily.    Marland Kitchen atorvastatin (LIPITOR) 40 MG tablet TAKE 1 TABLET EVERY DAY 90 tablet 2  . BD INSULIN SYRINGE ULTRAFINE 31G X 5/16" 1 ML MISC Use to inject insulin 5 times daily as instructed. Dx: E11.9 450 each 3  . Blood Glucose Monitoring Suppl (BLOOD GLUCOSE METER) kit Use as instructed 1 each 0  . Blood Glucose Monitoring Suppl (TRUE METRIX AIR GLUCOSE METER) DEVI 1 each by Does not apply route daily. 1 Device 0  . Cholecalciferol (VITAMIN D) 1000 UNITS capsule Take 1,000 Units by mouth at bedtime.     . clopidogrel (PLAVIX) 75 MG tablet TAKE 1 TABLET EVERY DAY WITH BREAKFAST 90 tablet 0  . fenofibrate 160 MG tablet Take 1 tablet (160 mg total) by mouth daily. 90 tablet 1  . fluocinonide cream (LIDEX) 3.29 % Apply 1 application topically daily as needed. FOR RASH    . fluticasone (FLONASE) 50 MCG/ACT nasal spray Place 2 sprays into both nostrils daily. (Patient taking differently: Place 2 sprays into both nostrils as needed. ) 16 g 1  . furosemide (LASIX) 80 MG tablet Take 1 tablet (80 mg total) by mouth daily. 90 tablet 1  . glucose blood (TRUE  METRIX BLOOD GLUCOSE TEST) test strip Use to test blood sugar 4 times daily as instructed. Dx: E11.9 400 each 2  . glucose blood (TRUETEST TEST) test strip Use to test blood sugar  3 times daily as instructed. Dx code: E11.9 and Lancets 3/day 275 each 3  . Insulin Pen Needle (PEN NEEDLES 31GX5/16") 31G X 8 MM MISC Use to inject insulin 2 times daily as instructed. 200 each 2  . insulin regular (NOVOLIN R RELION) 100 units/mL injection Inject 0.15-0.18 mLs (15-18 Units total) into the skin 3 (three) times daily before meals. (Patient taking differently: Inject 20 Units into the skin 3 (three) times daily before meals. ) 20 mL 2  . isosorbide mononitrate (IMDUR) 30 MG 24 hr tablet Take 1 tablet (30 mg total) by mouth daily. 90 tablet 1  . metoprolol (LOPRESSOR) 50 MG tablet Take 1 tablet (50 mg total) by mouth 2 (two) times daily. 180 tablet 1  . nitroGLYCERIN (NITROSTAT) 0.4 MG SL tablet Place 1 tablet (0.4 mg total) under the tongue every 5 (five) minutes as needed for chest pain. 25 tablet 3  . Omega-3 Fatty Acids (FISH OIL PO) Take by mouth daily.    Marland Kitchen oxybutynin (DITROPAN) 5 MG tablet Take 1 tablet by mouth 2 (two) times daily.    . pantoprazole (PROTONIX) 20 MG tablet Take 20 mg by mouth daily.    . tamsulosin (FLOMAX) 0.4 MG CAPS capsule TAKE 1 CAPSULE EVERY DAY 90 capsule 1  . TRUEPLUS LANCETS 28G MISC Use to test blood glucose 4 times daily as instructed. Dx code: E11.9 400 each 2  . insulin NPH Human (NOVOLIN N) 100 UNIT/ML injection Inject 35 units 2x a day (Patient taking differently: Inject 38 Units into the skin 2 (two) times daily before a meal. Inject 35 units 2x a day) 20 mL 2   No facility-administered medications prior to visit.    Allergies  Allergen Reactions  . Levemir [Insulin Detemir] Swelling    Patient had redness and swelling and tenderness at injection site.  . Codeine Other (See Comments)    Makes him "shakey", is OK with hydrocodone GI UPSET & TREMORS  . Lipitor  [Atorvastatin] Other (See Comments)    Muscle aches; liver functions. Patient is currently taken  . Novolog Mix [Insulin Aspart Prot & Aspart]     Causes skin to swell/itch at injection site    Review of Systems  Constitutional: Negative for fever and malaise/fatigue.  HENT: Negative for congestion.   Eyes: Negative for discharge.  Respiratory: Negative for shortness of breath.   Cardiovascular: Negative for chest pain, palpitations and leg swelling.  Gastrointestinal: Negative for nausea and abdominal pain.  Genitourinary: Negative for dysuria.  Musculoskeletal: Negative for falls.  Skin: Negative for rash.  Neurological: Negative for loss of consciousness and headaches.  Endo/Heme/Allergies: Negative for environmental allergies.  Psychiatric/Behavioral: Negative for depression. The patient is not nervous/anxious.        Objective:    Physical Exam  Constitutional: He is oriented to person, place, and time. Vital signs are normal. He appears well-developed and well-nourished. He is sleeping.  HENT:  Head: Normocephalic and atraumatic.  Mouth/Throat: Oropharynx is clear and moist.  Eyes: EOM are normal. Pupils are equal, round, and reactive to light.  Neck: Normal range of motion. Neck supple. No thyromegaly present.  Cardiovascular: Normal rate and regular rhythm.   No murmur heard. Pulmonary/Chest: Effort normal and breath sounds normal. No respiratory distress. He has no wheezes. He has no rales. He exhibits no tenderness.  Musculoskeletal: He exhibits no edema or tenderness.  Neurological: He is alert and oriented to person, place, and time.  Skin: Skin is warm and dry.  Psychiatric: He has a normal mood and affect. His behavior is normal. Judgment and thought content normal.    BP 116/74 mmHg  Pulse 78  Temp(Src) 98.2 F (36.8 C) (Oral)  Ht 5' 10.5" (1.791 m)  Wt 262 lb (118.842 kg)  BMI 37.05 kg/m2  SpO2 98% Wt Readings from Last 3 Encounters:  07/11/15 262 lb  (118.842 kg)  07/03/15 261 lb (118.389 kg)  06/13/15 259 lb (117.482 kg)     Lab Results  Component Value Date   WBC 6.5 07/04/2015   HGB 14.0 07/04/2015   HCT 40.7 07/04/2015   PLT 170.0 07/04/2015   GLUCOSE 174* 07/04/2015   CHOL 121 07/04/2015   TRIG 306.0* 07/04/2015   HDL 22.70* 07/04/2015   LDLDIRECT 55.0 07/04/2015   LDLCALC 31 04/09/2014   ALT 23 07/04/2015   AST 20 07/04/2015   NA 141 07/04/2015   K 3.7 07/04/2015   CL 104 07/04/2015   CREATININE 1.81* 07/04/2015   BUN 30* 07/04/2015   CO2 29 07/04/2015   TSH 3.47 07/04/2015   INR 1.0 07/25/2013   HGBA1C 7.8* 07/04/2015   MICROALBUR 0.7 08/02/2014    Lab Results  Component Value Date   TSH 3.47 07/04/2015   Lab Results  Component Value Date   WBC 6.5 07/04/2015   HGB 14.0 07/04/2015   HCT 40.7 07/04/2015   MCV 92.1 07/04/2015   PLT 170.0 07/04/2015   Lab Results  Component Value Date   NA 141 07/04/2015   K 3.7 07/04/2015   CO2 29 07/04/2015   GLUCOSE 174* 07/04/2015   BUN 30* 07/04/2015   CREATININE 1.81* 07/04/2015   BILITOT 0.5 07/04/2015   ALKPHOS 47 07/04/2015   AST 20 07/04/2015   ALT 23 07/04/2015   PROT 7.0 07/04/2015   ALBUMIN 4.1 07/04/2015   CALCIUM 9.9 07/04/2015   ANIONGAP 5 12/15/2014   GFR 39.01* 07/04/2015   Lab Results  Component Value Date   CHOL 121 07/04/2015   Lab Results  Component Value Date   HDL 22.70* 07/04/2015   Lab Results  Component Value Date   LDLCALC 31 04/09/2014   Lab Results  Component Value Date   TRIG 306.0* 07/04/2015   Lab Results  Component Value Date   CHOLHDL 5 07/04/2015   Lab Results  Component Value Date   HGBA1C 7.8* 07/04/2015       Assessment & Plan:   Problem List Items Addressed This Visit    Type 2 diabetes mellitus with other circulatory complications    Pt  Is not taking insulin as rx by endo Reviewed instructions with pt  He understands  F/u endo      Relevant Medications   insulin NPH Human (NOVOLIN N)  100 UNIT/ML injection   Renal insufficiency - Primary   Relevant Orders   Basic metabolic panel    Other Visit Diagnoses    Need for prophylactic vaccination and inoculation against influenza        Relevant Orders    Flu vaccine HIGH DOSE PF (Fluzone High dose) (Completed)    Diabetes mellitus type II, uncontrolled        Relevant Medications    insulin NPH Human (NOVOLIN N) 100 UNIT/ML injection       I have changed Mr. Goede insulin NPH Human. I am also having him maintain his Vitamin D, fluocinonide cream, aspirin EC, pantoprazole, acetaminophen, blood glucose meter kit and supplies, Omega-3 Fatty Acids (FISH OIL PO), nitroGLYCERIN, fluticasone, oxybutynin, PEN  NEEDLES 31GX5/16", insulin regular, BD INSULIN SYRINGE ULTRAFINE, metoprolol, furosemide, fenofibrate, atorvastatin, isosorbide mononitrate, glucose blood, TRUEPLUS LANCETS 28G, TRUE METRIX AIR GLUCOSE METER, glucose blood, albuterol, Albuterol Sulfate, clopidogrel, allopurinol, and tamsulosin.  Meds ordered this encounter  Medications  . insulin NPH Human (NOVOLIN N) 100 UNIT/ML injection    Sig: 38 u SQ bid AC    Dispense:  20 mL    Refill:  [place], DO

## 2015-07-12 LAB — BASIC METABOLIC PANEL
BUN: 29 mg/dL — AB (ref 6–23)
CHLORIDE: 102 meq/L (ref 96–112)
CO2: 28 mEq/L (ref 19–32)
Calcium: 10.1 mg/dL (ref 8.4–10.5)
Creatinine, Ser: 1.59 mg/dL — ABNORMAL HIGH (ref 0.40–1.50)
GFR: 45.3 mL/min — AB (ref >60.00)
GLUCOSE: 266 mg/dL — AB (ref 70–99)
POTASSIUM: 4.1 meq/L (ref 3.5–5.1)
SODIUM: 140 meq/L (ref 135–145)

## 2015-07-19 ENCOUNTER — Other Ambulatory Visit: Payer: Self-pay | Admitting: Family Medicine

## 2015-07-19 ENCOUNTER — Other Ambulatory Visit: Payer: Self-pay | Admitting: Cardiology

## 2015-08-01 ENCOUNTER — Other Ambulatory Visit: Payer: Self-pay | Admitting: Cardiology

## 2015-08-22 ENCOUNTER — Other Ambulatory Visit: Payer: Self-pay | Admitting: Pulmonary Disease

## 2015-08-22 DIAGNOSIS — Z9989 Dependence on other enabling machines and devices: Principal | ICD-10-CM

## 2015-08-22 DIAGNOSIS — G4733 Obstructive sleep apnea (adult) (pediatric): Secondary | ICD-10-CM

## 2015-08-23 ENCOUNTER — Ambulatory Visit: Payer: Commercial Managed Care - HMO | Admitting: Podiatry

## 2015-08-29 ENCOUNTER — Encounter: Payer: Self-pay | Admitting: Internal Medicine

## 2015-08-29 ENCOUNTER — Ambulatory Visit (INDEPENDENT_AMBULATORY_CARE_PROVIDER_SITE_OTHER): Payer: Commercial Managed Care - HMO | Admitting: Internal Medicine

## 2015-08-29 VITALS — BP 118/66 | HR 91 | Temp 98.2°F | Resp 12 | Wt 262.0 lb

## 2015-08-29 DIAGNOSIS — E1165 Type 2 diabetes mellitus with hyperglycemia: Secondary | ICD-10-CM

## 2015-08-29 DIAGNOSIS — E1159 Type 2 diabetes mellitus with other circulatory complications: Secondary | ICD-10-CM

## 2015-08-29 DIAGNOSIS — Z794 Long term (current) use of insulin: Secondary | ICD-10-CM | POA: Diagnosis not present

## 2015-08-29 DIAGNOSIS — IMO0002 Reserved for concepts with insufficient information to code with codable children: Secondary | ICD-10-CM

## 2015-08-29 MED ORDER — INSULIN NPH (HUMAN) (ISOPHANE) 100 UNIT/ML ~~LOC~~ SUSP: SUBCUTANEOUS | Status: DC

## 2015-08-29 MED ORDER — INSULIN REGULAR HUMAN 100 UNIT/ML IJ SOLN: 20.0000 [IU] | Freq: Three times a day (TID) | INTRAMUSCULAR | Status: DC

## 2015-08-29 NOTE — Progress Notes (Signed)
Patient ID: William Hendrix, male   DOB: [birth year], 74 y.o.   MRN: [id number]  HPI: William Hendrix is a 74 y.o.-year-old male, returning for f/u for DM2, dx 2009, insulin-dependent, uncontrolled, with complications (CAD, CKD stage 3). He is here with his wife who offers part of the history. Last visit 3 mo ago.  Last hemoglobin A1c was: Lab Results  Component Value Date   HGBA1C 7.8* 07/04/2015   HGBA1C 8.3* 03/28/2015   HGBA1C 10.5* 11/05/2014   Pt was on a regimen of Relion Novolin 70/30:  - 40 units before breakfast - 15 units before lunch - 30 units before dinner  At last visit we changed to: ReliOn insulin: Insulin Before breakfast Before lunch Before dinner  Regular 16 - small meal 20 - large meal 16 - small meal 20- large meal 16 - small meal 20 - large meal  NPH 38  38   We cannot use Metformin 2/2 CKD. We stopped Januvia at last visit >> could not afford ($100/3 mo)  We cannot use Levemir >> developed a severe skin reaction to Levemir  We stopped Amaryl 4 mg.  He brings his log. He checks 3 times a day: - am: 169-228 >> 151-192 >> 145-176  >> 200-280 >> 152-220 >> 95-162 >> 120-169 - Before lunch: 132-295 >> 137-220 >> 85-147 >> 219-292 >> 189-260 >> 77, 82-167 >> 95-149, 183 - before dinner: 95-200 >> 89-175 >> 191-378 >> 89, 156-299, 394 >> 65, 71, 109-148, 168 >> 73, 85-165, 207-247 if skips lunch - Before bedtime, highest sugars at that day, 175-240 >> 157-179 >> 102 >> n/c No lows. Lowest sugar was 150 >> 65 >> 73,  he does not know whether he has hypoglycemia awareness. Highest sugar was 300s >> 180 >> 247.  Pt has chronic kidney disease, last BUN/creatinine was:  Lab Results  Component Value Date   BUN 29* 07/11/2015   CREATININE 1.59* 07/11/2015  ACR 0.7. Last lipids: Lab Results  Component Value Date   CHOL 121 07/04/2015   HDL 22.70* 07/04/2015   LDLCALC 31 04/09/2014   LDLDIRECT 55.0 07/04/2015   TRIG 306.0* 07/04/2015   CHOLHDL 5 07/04/2015    Pt's last eye exam was 3 years ago. No DR. Had cataract sx 6 years ago.  Denies numbness and tingling in his legs.  PMH: Pt also also has a history of CAD, sees Dr. Ron Parker (but will change Dr's soon as he retires), hypertension, hyperlipidemia, chronic kidney disease stage III, history of nephrolithiasis, obesity, hiatal hernia, obstructive sleep apnea, hyperglyceridemia, melanoma, spinal stenosis.  ROS: Constitutional: no  weight gain, no fatigue, no subjective hyperthermia/hypothermia Eyes: no blurry vision, no xerophthalmia ENT: no sore throat, no nodules palpated in throat, no dysphagia/odynophagia, no hoarseness Cardiovascular: no CP/SOB/no palpitations/+ leg swelling Respiratory: no cough/SOB Gastrointestinal: no N/V/D/C Musculoskeletal: no muscle/joint aches Skin: no rashes  I reviewed pt's medications, allergies, PMH, social hx, family hx, and changes were documented in the history of present illness. Otherwise, unchanged from my initial visit note.  PE: BP 118/66 mmHg  Pulse 91  Temp(Src) 98.2 F (36.8 C) (Oral)  Resp 12  Wt 262 lb (118.842 kg)  SpO2 94% Body mass index is 37.05 kg/(m^2). Wt Readings from Last 3 Encounters:  08/29/15 262 lb (118.842 kg)  07/11/15 262 lb (118.842 kg)  07/03/15 261 lb (118.389 kg)  Constitutional: overweight, in NAD Eyes: PERRLA, EOMI, no exophthalmos ENT: moist mucous membranes, no thyromegaly, no cervical lymphadenopathy Cardiovascular: RRR, No  MRG, + leg swelling B Respiratory: CTA B Gastrointestinal: abdomen soft, NT, ND, BS+ Musculoskeletal: no deformities, strength intact in all 4 Skin: moist, warm, no rashes  ASSESSMENT: 1. DM2, insulin-dependent, uncontrolled, with complications - CAD, s/p stents - Dr. Ron Parker.  He had stenting of distal RCA/PDA in 07/2013. Prior stenting of LAD in 04/2013. - CKD stage 3  PLAN:  1. Patient with improving diabetes control after switching to basal-bolus insulin regimen. HbA1c has decreased  nicely at last check in 06/2015, at 7.8%. His sugars are high before dinner if skips lunch and does not inject insulin at lunchtime or if he eats lunch but he does not have the insulin with him. He will try to take the insulin with him and also will try not to skip lunch. I will also advise him to take more R insulin with a larger meal, now around the Holidays. - I advised him to: Patient Instructions   Please use the following insulin regimen: Insulin Before breakfast Before lunch Before dinner  Regular 20 - regular meal 25 - larger meal 20 - regular meal 25 - larger meal 20 - regular meal 25 - larger meal  NPH 40  40   If you plan to be active after a meal, decrease the dose of Regular insulin by 5 units.  Please return in 3 months with your sugar log.   - will check a HbA1c at next visit with me or PCP - He is up-to-date with flu shot - Needs a new eye exam >> scheduled - I will see the patient back in 3 mo with his sugar log

## 2015-08-29 NOTE — Patient Instructions (Signed)
Please use the following insulin regimen: Insulin Before breakfast Before lunch Before dinner  Regular 20 - regular meal 25 - larger meal 20 - regular meal 25 - larger meal 20 - regular meal 25 - larger meal  NPH 40  40   If you plan to be active after a meal, decrease the dose of Regular insulin by 5 units.  Please return in 3 months with your sugar log.

## 2015-09-09 NOTE — Telephone Encounter (Signed)
[employer] Josem Kaufmann [id number]

## 2015-09-12 ENCOUNTER — Telehealth: Payer: Self-pay | Admitting: Internal Medicine

## 2015-09-12 DIAGNOSIS — Z794 Long term (current) use of insulin: Principal | ICD-10-CM

## 2015-09-12 DIAGNOSIS — IMO0002 Reserved for concepts with insufficient information to code with codable children: Secondary | ICD-10-CM

## 2015-09-12 DIAGNOSIS — E1165 Type 2 diabetes mellitus with hyperglycemia: Principal | ICD-10-CM

## 2015-09-12 DIAGNOSIS — E1159 Type 2 diabetes mellitus with other circulatory complications: Secondary | ICD-10-CM

## 2015-09-12 MED ORDER — INSULIN NPH (HUMAN) (ISOPHANE) 100 UNIT/ML ~~LOC~~ SUSP: SUBCUTANEOUS | Status: DC

## 2015-09-12 MED ORDER — "BD INSULIN SYRINGE ULTRAFINE 31G X 5/16"" 1 ML MISC": Status: DC

## 2015-09-12 MED ORDER — INSULIN REGULAR HUMAN 100 UNIT/ML IJ SOLN: 20.0000 [IU] | Freq: Three times a day (TID) | INTRAMUSCULAR | Status: DC

## 2015-09-12 NOTE — Telephone Encounter (Signed)
Resending. Be advised.

## 2015-09-12 NOTE — Telephone Encounter (Signed)
[employer] is stating that they have not received the rx for the novolin N and R and the syringes please resend the previous orders for

## 2015-09-12 NOTE — Addendum Note (Signed)
Addended by: Rockie Neighbours B on: 09/12/2015 04:30 PM   Modules accepted: Orders

## 2015-09-30 DIAGNOSIS — H524 Presbyopia: Secondary | ICD-10-CM | POA: Diagnosis not present

## 2015-09-30 DIAGNOSIS — H5210 Myopia, unspecified eye: Secondary | ICD-10-CM | POA: Diagnosis not present

## 2015-09-30 LAB — HM DIABETES EYE EXAM

## 2015-10-01 ENCOUNTER — Ambulatory Visit (INDEPENDENT_AMBULATORY_CARE_PROVIDER_SITE_OTHER): Payer: Commercial Managed Care - HMO | Admitting: Medical

## 2015-10-01 ENCOUNTER — Encounter: Payer: Self-pay | Admitting: Medical

## 2015-10-01 VITALS — BP 118/72 | HR 71 | Temp 98.1°F | Ht 70.5 in | Wt 263.0 lb

## 2015-10-01 DIAGNOSIS — N390 Urinary tract infection, site not specified: Secondary | ICD-10-CM

## 2015-10-01 DIAGNOSIS — M545 Low back pain, unspecified: Secondary | ICD-10-CM

## 2015-10-01 DIAGNOSIS — M549 Dorsalgia, unspecified: Secondary | ICD-10-CM

## 2015-10-01 DIAGNOSIS — R82998 Other abnormal findings in urine: Secondary | ICD-10-CM

## 2015-10-01 LAB — CBC WITH DIFFERENTIAL/PLATELET
BASOS ABS: 0 10*3/uL (ref 0.0–0.1)
BASOS PCT: 0.3 % (ref 0.0–3.0)
EOS ABS: 0.1 10*3/uL (ref 0.0–0.7)
Eosinophils Relative: 1.5 % (ref 0.0–5.0)
HEMATOCRIT: 44.4 % (ref 39.0–52.0)
HEMOGLOBIN: 14.9 g/dL (ref 13.0–17.0)
LYMPHS PCT: 39.1 % (ref 12.0–46.0)
Lymphs Abs: 2.7 10*3/uL (ref 0.7–4.0)
MCHC: 33.6 g/dL (ref 30.0–36.0)
MCV: 93.2 fl (ref 78.0–100.0)
MONOS PCT: 7 % (ref 3.0–12.0)
Monocytes Absolute: 0.5 10*3/uL (ref 0.1–1.0)
Neutro Abs: 3.6 10*3/uL (ref 1.4–7.7)
Neutrophils Relative %: 52.1 % (ref 43.0–77.0)
Platelets: 197 10*3/uL (ref 150.0–400.0)
RBC: 4.76 Mil/uL (ref 4.22–5.81)
RDW: 13.8 % (ref 11.5–15.5)
WBC: 6.8 10*3/uL (ref 4.0–10.5)

## 2015-10-01 LAB — COMPREHENSIVE METABOLIC PANEL
ALBUMIN: 4.2 g/dL (ref 3.5–5.2)
ALT: 28 U/L (ref 0–53)
AST: 24 U/L (ref 0–37)
Alkaline Phosphatase: 49 U/L (ref 39–117)
BILIRUBIN TOTAL: 0.7 mg/dL (ref 0.2–1.2)
BUN: 30 mg/dL — ABNORMAL HIGH (ref 6–23)
CALCIUM: 10.2 mg/dL (ref 8.4–10.5)
CO2: 30 mEq/L (ref 19–32)
CREATININE: 1.49 mg/dL (ref 0.40–1.50)
Chloride: 102 mEq/L (ref 96–112)
GFR: 48.79 mL/min — ABNORMAL LOW (ref >60.00)
Glucose, Bld: 250 mg/dL — ABNORMAL HIGH (ref 70–99)
Potassium: 4.2 mEq/L (ref 3.5–5.1)
Sodium: 140 mEq/L (ref 135–145)
Total Protein: 7.2 g/dL (ref 6.0–8.3)

## 2015-10-01 LAB — POCT URINALYSIS DIPSTICK
Bilirubin, UA: NEGATIVE
Glucose, UA: NEGATIVE
Ketones, UA: NEGATIVE
Nitrite, UA: NEGATIVE
PROTEIN UA: NEGATIVE
RBC UA: NEGATIVE
SPEC GRAV UA: 1.025
UROBILINOGEN UA: 0.2
pH, UA: 6

## 2015-10-01 MED ORDER — HYDROCODONE-ACETAMINOPHEN 5-325 MG PO TABS: 1.0000 | ORAL_TABLET | Freq: Four times a day (QID) | ORAL | Status: DC | PRN

## 2015-10-01 MED ORDER — CYCLOBENZAPRINE HCL 5 MG PO TABS: ORAL_TABLET | ORAL | Status: DC

## 2015-10-01 NOTE — Progress Notes (Signed)
Subjective:    Patient ID: William Hendrix, male    DOB: 02-21-1941, 74 y.o.   MRN: [id number]  HPI  Pt in for some left side back pain. Pt present for about a week. Gradually worsening. Yesterday is worse. Pt pain is worse bending over and twisting. Patient can find position where the pain eases up some.  Pain 7-8/10 on movement.   Pt has some known low gfr in the past. Pt 2 months ago had decrease in lasix.  No nausea, no vomiting. No fever, no chills. Some pain radiating to left flank /abdomen at times. Hx of kidney stones in past years ago but he can't remember the size or side.  Review of Systems  Constitutional: Negative for fever, chills and fatigue.  Respiratory: Negative for cough, chest tightness, shortness of breath and wheezing.   Cardiovascular: Negative for chest pain and palpitations.  Musculoskeletal: Positive for back pain. Negative for myalgias, joint swelling and neck stiffness.  Neurological: Negative for dizziness and headaches.  Hematological: Negative for adenopathy. Does not bruise/bleed easily.  Psychiatric/Behavioral: Negative for behavioral problems and confusion.    Past Medical History  Diagnosis Date  . Obesity   . Edema   . DM (diabetes mellitus) ([place])   . Fatigue   . Gout   . Skin cancer   . CAD (coronary artery disease)     a. Stent 2000. b. Several caths since then with nonobst dz. c. 05/2013: IVUS-guided PTCA/DES to proximal-to-mid LAD, residual PDA disease for med rx unless recurrent angina. EF 55-60%.  . Ventral hernia   . Sinusitis   . Back problem   . Hyperlipidemia   . HTN (hypertension)   . Ejection fraction     55%, 07/2010, mild inferior hypo  . SOB (shortness of breath)   . Dizziness     Mild positional dizziness May, 2012  . History of pneumonia   . Melanoma ([place])     Melanoma removed from the left forearm with wide excision May, 2012  . Pneumonia     hx x4 young  . Sleep apnea     CPAP,  [name] since 2000  . GERD  (gastroesophageal reflux disease)   . H/O hiatal hernia   . Arthritis   . Spinal stenosis     a. s/p surgical repair 2013  . Positive D-dimer     a. significant elevation ,hospital 07/2010, etiology unclear. b. D Dimer chronically > 20.  Marland Kitchen CKD (chronic kidney disease)      CKD stage III.. creatinine up to 1.5 in hospital after dye in October, 2011  . Nephrolithiasis   . CHF (congestive heart failure) ([place])   . AAA (abdominal aortic aneurysm) ([place]) 12-15-14    Mild aneurysmal dilatation of the infrarenal abdominal aorta 3.2 cm   . H/O diverticulitis of colon 01/31/2015    Social History   Social History  . Marital Status: Married    Spouse Name: N/A  . Number of Children: N/A  . Years of Education: N/A   Occupational History  . [employer]    Social History Main Topics  . Smoking status: Former Smoker -- 3.00 packs/day for 30 years    Types: Cigarettes    Quit date: 09/17/1996  . Smokeless tobacco: Never Used  . Alcohol Use: No  . Drug Use: No  . Sexual Activity: Not Currently    Birth Control/ Protection: None   Other Topics Concern  . Not on file   Social  History Narrative   Married and lives locally with his wife.  [employer].    Past Surgical History  Procedure Laterality Date  . Nose surgery    . Cardiac catheterization    . Coronary stent placement  2000    CAD  . Knee arthroscopy  90's    rt  . Cervical disc surgery  90's  . Lumbar laminectomy/decompression microdiscectomy  08/30/2012    Procedure: LUMBAR LAMINECTOMY/DECOMPRESSION MICRODISCECTOMY 2 LEVELS;  Surgeon: [name], MD;  Location: [facility];  Service: Neurosurgery;  Laterality: Bilateral;  Bilateral Lumbar three-four Lumbar four-five Laminotomies  . Back surgery    . Coronary angioplasty with stent placement  07/26/2013    DES to RCA extending to PDA    . Left heart catheterization with coronary angiogram N/A 05/19/2013    Procedure: LEFT HEART CATHETERIZATION WITH CORONARY ANGIOGRAM;  Surgeon:  [name], MD;  Location: [facility] CATH LAB;  Service: Cardiovascular;  Laterality: N/A;  . Percutaneous coronary stent intervention (pci-s)  05/19/2013    Procedure: PERCUTANEOUS CORONARY STENT INTERVENTION (PCI-S);  Surgeon: [name], MD;  Location: [facility] CATH LAB;  Service: Cardiovascular;;  . Left and right heart catheterization with coronary angiogram N/A 07/26/2013    Procedure: LEFT AND RIGHT HEART CATHETERIZATION WITH CORONARY ANGIOGRAM;  Surgeon: [name], MD;  Location: [place] [facility] CATH LAB;  Service: Cardiovascular;  Laterality: N/A;    Family History  Problem Relation Age of Onset  . Prostate cancer    . Kidney cancer    . Cancer      Bladder cancer  . Coronary artery disease    . Cancer Mother     intestinal   . Heart disease Mother   . Cancer Father     prostate  . Migraines Daughter   . Leukemia Sister   . Heart disease Sister   . Heart attack Neg Hx   . Stroke Neg Hx   . Hypertension Sister   . Hypertension Brother     Allergies  Allergen Reactions  . Levemir [Insulin Detemir] Swelling    Patient had redness and swelling and tenderness at injection site.  . Codeine Other (See Comments)    Makes him "shakey", is OK with hydrocodone GI UPSET & TREMORS  . Lipitor [Atorvastatin] Other (See Comments)    Muscle aches; liver functions. Patient is currently taken  . Novolog Mix [Insulin Aspart Prot & Aspart]     Causes skin to swell/itch at injection site    Current Outpatient Prescriptions on File Prior to Visit  Medication Sig Dispense Refill  . acetaminophen (TYLENOL) 500 MG tablet Take 1,000 mg by mouth 2 (two) times daily.    Marland Kitchen albuterol (PROVENTIL HFA;VENTOLIN HFA) 108 (90 BASE) MCG/ACT inhaler Inhale 2 puffs into the lungs every 6 (six) hours as needed for wheezing or shortness of breath. 18 Inhaler 2  . Albuterol Sulfate (PROAIR RESPICLICK) 924 (90 BASE) MCG/ACT AEPB Inhale 2 puffs into the lungs every 4 (four) hours as needed. 1 each 3  . allopurinol  (ZYLOPRIM) 300 MG tablet TAKE 1 TABLET (300 MG TOTAL) BY MOUTH DAILY. 90 tablet 1  . aspirin EC 81 MG tablet Take 81 mg by mouth daily.    Marland Kitchen atorvastatin (LIPITOR) 40 MG tablet TAKE 1 TABLET EVERY DAY 90 tablet 2  . BD INSULIN SYRINGE ULTRAFINE 31G X 5/16" 1 ML MISC Use to inject insulin 5 times daily as instructed. Dx: E11.9 450 each 3  . Blood Glucose Monitoring  Suppl (BLOOD GLUCOSE METER) kit Use as instructed 1 each 0  . Blood Glucose Monitoring Suppl (TRUE METRIX AIR GLUCOSE METER) DEVI 1 each by Does not apply route daily. 1 Device 0  . Cholecalciferol (VITAMIN D) 1000 UNITS capsule Take 1,000 Units by mouth at bedtime.     . clopidogrel (PLAVIX) 75 MG tablet TAKE 1 TABLET EVERY DAY WITH BREAKFAST 90 tablet 3  . fenofibrate 160 MG tablet TAKE 1 TABLET EVERY DAY 90 tablet 1  . fluocinonide cream (LIDEX) 1.61 % Apply 1 application topically daily as needed. FOR RASH    . fluticasone (FLONASE) 50 MCG/ACT nasal spray Place 2 sprays into both nostrils daily. (Patient taking differently: Place 2 sprays into both nostrils as needed. ) 16 g 1  . furosemide (LASIX) 80 MG tablet TAKE 1 TABLET EVERY DAY 90 tablet 1  . glucose blood (TRUE METRIX BLOOD GLUCOSE TEST) test strip Use to test blood sugar 4 times daily as instructed. Dx: E11.9 400 each 2  . glucose blood (TRUETEST TEST) test strip Use to test blood sugar 3 times daily as instructed. Dx code: E11.9 and Lancets 3/day 275 each 3  . insulin NPH Human (NOVOLIN N) 100 UNIT/ML injection Inject 40 units 2x a day 70 mL 1  . Insulin Pen Needle (PEN NEEDLES 31GX5/16") 31G X 8 MM MISC Use to inject insulin 2 times daily as instructed. 200 each 2  . insulin regular (NOVOLIN R RELION) 100 units/mL injection Inject 0.2-0.25 mLs (20-25 Units total) into the skin 3 (three) times daily before meals. 70 mL 1  . isosorbide mononitrate (IMDUR) 30 MG 24 hr tablet TAKE 1 TABLET EVERY DAY 90 tablet 3  . metoprolol (LOPRESSOR) 50 MG tablet TAKE 1 TABLET TWICE DAILY  180 tablet 1  . nitroGLYCERIN (NITROSTAT) 0.4 MG SL tablet Place 1 tablet (0.4 mg total) under the tongue every 5 (five) minutes as needed for chest pain. 25 tablet 3  . Omega-3 Fatty Acids (FISH OIL PO) Take by mouth daily.    Marland Kitchen oxybutynin (DITROPAN) 5 MG tablet Take 1 tablet by mouth 2 (two) times daily.    . pantoprazole (PROTONIX) 20 MG tablet Take 20 mg by mouth daily.    . tamsulosin (FLOMAX) 0.4 MG CAPS capsule TAKE 1 CAPSULE EVERY DAY 90 capsule 1  . TRUEPLUS LANCETS 28G MISC Use to test blood glucose 4 times daily as instructed. Dx code: E11.9 400 each 2   No current facility-administered medications on file prior to visit.    BP 118/72 mmHg  Pulse 71  Temp(Src) 98.1 F (36.7 C) (Oral)  Ht 5' 10.5" (1.791 m)  Wt 263 lb (119.296 kg)  BMI 37.19 kg/m2  SpO2 98%       Objective:   Physical Exam  General Appearance- Not in acute distress.    Chest and Lung Exam Auscultation: Breath sounds:-Normal. Clear even and unlabored. Adventitious sounds:- No Adventitious sounds.  Cardiovascular Auscultation:Rythm - Regular, rate and rythm. Heart Sounds -Normal heart sounds.  Abdomen Inspection:-Inspection Normal.  Palpation/Perucssion: Palpation and Percussion of the abdomen reveal- Non Tender abdomen but left flank region tenderness.(no llq pain), No Rebound tenderness, No rigidity(Guarding) and No Palpable abdominal masses.  Liver:-Normal.  Spleen:- Normal.   Back Lt side para  lumbar spine tenderness to palpation. But also lt cva area pain. Obvious pain on lying supine and changing positions. Pain on lateral movements and flexion/extension of the spine.        Assessment & Plan:  Your  pain mostly seemed to muscular in origin by recent history and exam. Your urine showed 1+ infection fighting cells but was otherwise normal. I am culturing your urine.  I do want to get cbc and cmp today to check wbc and your gfr.  Since you have no blood in urine, this decreases  possibility of kidney stone.   I will rx you norco for pain and low dose muscle relaxant to use at night. I want you to update me tomorrow afternoon how you feel.  This is week before holiday and if you are not improving I may get ct abd /pelvis to evaluate for kidney stones.(study not indicated now but may be needed)  Follow up in Friday or as needed  Any severe sign or symptom change after hours or over holiday weekend then ED eval

## 2015-10-01 NOTE — Patient Instructions (Addendum)
Your pain mostly seemed to muscular in origin by recent history and exam. Your urine showed 1+ infection fighting cells but was otherwise normal. I am culturing your urine.  I do want to get cbc and cmp today to check wbc and your gfr.  Since you have no blood in urine, this decreases possibility of kidney stone.   I will rx you norco for pain and low dose muscle relaxant to use at night. I want you to update me tomorrow afternoon how you feel.  This is week before holiday and if you are not improving I may get ct abd /pelvis to evaluate for kidney stones.(study not indicated now but may be needed)  Follow up in Friday or as needed  Any severe sign or symptom change after hours or over holiday weekend then ED eval.   Also in light of old mri report if pain worsens/radicular type pattern and severe then refer to neurosurgeon.

## 2015-10-01 NOTE — Progress Notes (Signed)
Pre visit review using our clinic review tool, if applicable. No additional management support is needed unless otherwise documented below in the visit note. 

## 2015-10-02 LAB — URINE CULTURE
COLONY COUNT: NO GROWTH
Organism ID, Bacteria: NO GROWTH

## 2015-10-07 ENCOUNTER — Other Ambulatory Visit: Payer: Self-pay | Admitting: Family Medicine

## 2015-10-08 ENCOUNTER — Telehealth: Payer: Self-pay | Admitting: Family Medicine

## 2015-10-08 NOTE — Telephone Encounter (Signed)
Patient Name: IDIRIS CAPANO DOB: [birth year] Initial Comment caller states husband has back pain Nurse Assessment Nurse: Ronnald Ramp, RN, Miranda Date/Time (Eastern Time): 10/08/2015 10:50:59 AM Confirm and document reason for call. If symptomatic, describe symptoms. ---Caller states her husband is having back pain. He was seen on Tuesday and prescribed muscle relaxer and pain medication. Told to come back in if symptoms were not resolved. Has the patient traveled out of the country within the last 30 days? ---Not Applicable Does the patient have any new or worsening symptoms? ---Yes Will a triage be completed? ---Yes Related visit to physician within the last 2 weeks? ---Yes Does the PT have any chronic conditions? (i.e. diabetes, asthma, etc.) ---Yes List chronic conditions. ---Multiple - see EPIC Is this a behavioral health or substance abuse call? ---No Guidelines Guideline Title Affirmed Question Affirmed Notes Back Pain [1] MODERATE back pain (e.g., interferes with normal activities) AND [2] present > 3 days Final Disposition User See PCP When Office is Open (within 3 days) Ronnald Ramp, RN, Miranda Comments Has not taken anything for pain today Appt scheduled for tomorrow 12/28 at 245pm with Dr. Birdie Riddle Referrals REFERRED TO PCP OFFICE Disagree/Comply: Comply

## 2015-10-09 ENCOUNTER — Encounter: Payer: Self-pay | Admitting: General Practice

## 2015-10-09 ENCOUNTER — Encounter: Payer: Self-pay | Admitting: Family Medicine

## 2015-10-09 ENCOUNTER — Ambulatory Visit (HOSPITAL_BASED_OUTPATIENT_CLINIC_OR_DEPARTMENT_OTHER)
Admission: RE | Admit: 2015-10-09 | Discharge: 2015-10-09 | Disposition: A | Discharge status: Home / Self Care | Payer: Commercial Managed Care - HMO | Source: Ambulatory Visit | Attending: Family Medicine | Admitting: Family Medicine

## 2015-10-09 ENCOUNTER — Ambulatory Visit (INDEPENDENT_AMBULATORY_CARE_PROVIDER_SITE_OTHER): Payer: Commercial Managed Care - HMO | Admitting: Family Medicine

## 2015-10-09 VITALS — BP 132/78 | HR 97 | Temp 97.7°F | Resp 16 | Ht 71.0 in | Wt 266.2 lb

## 2015-10-09 DIAGNOSIS — M47816 Spondylosis without myelopathy or radiculopathy, lumbar region: Secondary | ICD-10-CM | POA: Insufficient documentation

## 2015-10-09 DIAGNOSIS — M5416 Radiculopathy, lumbar region: Secondary | ICD-10-CM | POA: Insufficient documentation

## 2015-10-09 MED ORDER — CYCLOBENZAPRINE HCL 5 MG PO TABS: ORAL_TABLET | ORAL | Status: DC

## 2015-10-09 MED ORDER — PREDNISONE 10 MG PO TABS: ORAL_TABLET | ORAL | Status: DC

## 2015-10-09 NOTE — Patient Instructions (Signed)
Follow up via phone or MyChart in 1-2 weeks to let me know how you are doing Go downstairs and get your xrays Start the Prednisone as directed- take w/ food Use the Flexeril prior to bed for muscle spasm Heating pad for pain relief Increase your NPH insulin to 45 units twice daily for the first 3 days, then 43 units twice daily for the next 3 days, and then back to your regular dose If your pre-meal sugars are >200, add 5 units of regular insulin to your usual dose Call if sugars are out of control and we can make additional adjustments Call with any questions or concerns Hang in there!!!

## 2015-10-09 NOTE — Progress Notes (Signed)
   Subjective:    Patient ID: William Hendrix, male    DOB: Feb 06, 1941, 74 y.o.   MRN: [id number]  HPI Back pain- pt was seen on 12/20 for same issue and tx'd w/ hydrocodone.  L sided low back.  sxs started 3-4 weeks ago.  Pain is 'pretty constant'.  Wife reports he is moaning at night, 'every time i move it wakes me up'.  Pain radiates into buttock and thigh.  Some temporary relief w/ TENS unit.  No relief w/ hydrocodone.  Muscle relaxers are somewhat effective.  Hx of spinal stenosis- has seen Dr Ellene Route previously.  No relief w/ leaning forward.   Review of Systems For ROS see HPI     Objective:   Physical Exam  Constitutional: He is oriented to person, place, and time. He appears well-developed and well-nourished. No distress.  HENT:  Head: Normocephalic and atraumatic.  Cardiovascular: Intact distal pulses.   Musculoskeletal: He exhibits no tenderness (no TTP over spine).  Difficulty rising from seated position Pain w/ flexion and extension  Neurological: He is alert and oriented to person, place, and time. He has normal reflexes. No cranial nerve deficit.  + antalgic gait Mildly + SLR on L, (-) on R  Skin: Skin is warm and dry.  Psychiatric: He has a normal mood and affect. His behavior is normal.  Vitals reviewed.         Assessment & Plan:

## 2015-10-09 NOTE — Assessment & Plan Note (Signed)
New to provider.  Pt has hx of spinal stenosis and 2 back surgeries w/ Dr Ellene Route.  Current pain began w/o injury or incident.  No relief w/ narcotic pain meds.  Some improvement w/ recent flexeril.  Start Prednisone but will need to adjust pt's insulin due to his ongoing diabetes.  Called Endo to assist w/ adjustments but provider is on vacation.  Written insulin adjustments provided to pt.  Get Xrays to assess for acute bony abnormality.  If no improvement w/ prednisone, pt will need f/u w/ Dr Ellene Route.  Reviewed supportive care and red flags that should prompt return.  Pt expressed understanding and is in agreement w/ plan.

## 2015-10-09 NOTE — Progress Notes (Signed)
Pre visit review using our clinic review tool, if applicable. No additional management support is needed unless otherwise documented below in the visit note. 

## 2015-10-11 ENCOUNTER — Encounter: Payer: Self-pay | Admitting: Internal Medicine

## 2015-10-30 ENCOUNTER — Other Ambulatory Visit: Payer: Self-pay | Admitting: Family Medicine

## 2015-11-28 ENCOUNTER — Ambulatory Visit (INDEPENDENT_AMBULATORY_CARE_PROVIDER_SITE_OTHER): Payer: Commercial Managed Care - HMO | Admitting: Internal Medicine

## 2015-11-28 ENCOUNTER — Other Ambulatory Visit (INDEPENDENT_AMBULATORY_CARE_PROVIDER_SITE_OTHER): Payer: Commercial Managed Care - HMO | Admitting: *Deleted

## 2015-11-28 ENCOUNTER — Encounter: Payer: Self-pay | Admitting: Internal Medicine

## 2015-11-28 ENCOUNTER — Ambulatory Visit: Payer: Commercial Managed Care - HMO | Admitting: Internal Medicine

## 2015-11-28 VITALS — BP 118/64 | HR 69 | Temp 97.9°F | Resp 12 | Wt 266.0 lb

## 2015-11-28 DIAGNOSIS — E1159 Type 2 diabetes mellitus with other circulatory complications: Secondary | ICD-10-CM

## 2015-11-28 DIAGNOSIS — E1165 Type 2 diabetes mellitus with hyperglycemia: Secondary | ICD-10-CM

## 2015-11-28 DIAGNOSIS — Z794 Long term (current) use of insulin: Secondary | ICD-10-CM

## 2015-11-28 DIAGNOSIS — IMO0002 Reserved for concepts with insufficient information to code with codable children: Secondary | ICD-10-CM

## 2015-11-28 DIAGNOSIS — E1151 Type 2 diabetes mellitus with diabetic peripheral angiopathy without gangrene: Secondary | ICD-10-CM

## 2015-11-28 DIAGNOSIS — E118 Type 2 diabetes mellitus with unspecified complications: Secondary | ICD-10-CM | POA: Insufficient documentation

## 2015-11-28 LAB — POCT GLYCOSYLATED HEMOGLOBIN (HGB A1C): HEMOGLOBIN A1C: 7.9

## 2015-11-28 MED ORDER — INSULIN REGULAR HUMAN 100 UNIT/ML IJ SOLN: 25.0000 [IU] | Freq: Three times a day (TID) | INTRAMUSCULAR | Status: DC

## 2015-11-28 MED ORDER — INSULIN NPH (HUMAN) (ISOPHANE) 100 UNIT/ML ~~LOC~~ SUSP: SUBCUTANEOUS | Status: DC

## 2015-11-28 NOTE — Progress Notes (Signed)
Patient ID: William Hendrix, male   DOB: [birth year], 75 y.o.   MRN: [id number]  HPI: William Hendrix is a 75 y.o.-year-old male, returning for f/u for DM2, dx 2009, insulin-dependent, uncontrolled, with complications (CAD, CKD stage 3). He is here with his wife who offers part of the history. Last visit 3 mo ago.  Last hemoglobin A1c was: Lab Results  Component Value Date   HGBA1C 7.8* 07/04/2015   HGBA1C 8.3* 03/28/2015   HGBA1C 10.5* 11/05/2014   Pt was on a regimen of Relion Novolin 70/30:  - 40 units before breakfast - 15 units before lunch - 30 units before dinner  At last visit we changed to: ReliOn insulin: Insulin Before breakfast Before lunch Before dinner  Regular 25 25 25   NPH 40  40   We cannot use Metformin 2/2 CKD. We stopped Januvia at last visit >> could not afford ($100/3 mo)  We cannot use Levemir >> developed a severe skin reaction to Levemir  We stopped Amaryl 4 mg.  He brings his log. He checks 3 times a day >> slightly higher: - am: 169-228 >> 151-192 >> 145-176  >> 200-280 >> 152-220 >> 95-162 >> 120-169 >> 114, 147-178 - Before lunch: 132-295 >> 137-220 >> 85-147 >> 219-292 >> 189-260 >> 77, 82-167 >> 95-149, 183 >> 146-179 - before dinner: 89-175 >> 191-378 >> 89, 156-299, 394 >> 65, 71, 109-148, 168 >> 73, 85-165, 207-247 if skips lunch >> 109-173, 191, 225 - Before bedtime, highest sugars at that day, 175-240 >> 157-179 >> 102 >> n/c No lows. Lowest sugar was 150 >> 65 >> 73 >> 102 ,  he does not know whether he has hypoglycemia awareness. Highest sugar was 300s >> 180 >> 247 >> 225.  Pt has chronic kidney disease, last BUN/creatinine was:  Lab Results  Component Value Date   BUN 30* 10/01/2015   CREATININE 1.49 10/01/2015  ACR 0.7. Last lipids: Lab Results  Component Value Date   CHOL 121 07/04/2015   HDL 22.70* 07/04/2015   LDLCALC 31 04/09/2014   LDLDIRECT 55.0 07/04/2015   TRIG 306.0* 07/04/2015   CHOLHDL 5 07/04/2015   Pt's last eye  exam was 09/30/2015. No DR. Had cataract sx 6 years ago.  Denies numbness and tingling in his legs.  PMH: Pt also also has a history of CAD, sees Dr. Ron Parker (but will change Dr's soon as he retires), hypertension, hyperlipidemia, chronic kidney disease stage III, history of nephrolithiasis, obesity, hiatal hernia, obstructive sleep apnea, hyperglyceridemia, melanoma, spinal stenosis.  ROS: Constitutional: no  weight gain, no fatigue, no subjective hyperthermia/hypothermia Eyes: no blurry vision, no xerophthalmia ENT: no sore throat, no nodules palpated in throat, no dysphagia/odynophagia, no hoarseness Cardiovascular: no CP/SOB/no palpitations/+ leg swelling Respiratory: no cough/SOB Gastrointestinal: no N/V/D/C Musculoskeletal: no muscle/joint aches Skin: no rashes  I reviewed pt's medications, allergies, PMH, social hx, family hx, and changes were documented in the history of present illness. Otherwise, unchanged from my initial visit note.  PE: BP 118/64 mmHg  Pulse 69  Temp(Src) 97.9 F (36.6 C) (Oral)  Resp 12  Wt 266 lb (120.657 kg)  SpO2 96% Body mass index is 37.12 kg/(m^2). Wt Readings from Last 3 Encounters:  11/28/15 266 lb (120.657 kg)  10/09/15 266 lb 3.2 oz (120.748 kg)  10/01/15 263 lb (119.296 kg)  Constitutional: overweight, in NAD Eyes: PERRLA, EOMI, no exophthalmos ENT: moist mucous membranes, no thyromegaly, no cervical lymphadenopathy Cardiovascular: RRR, No MRG, + leg swelling B  Respiratory: CTA B Gastrointestinal: abdomen soft, NT, ND, BS+ Musculoskeletal: no deformities, strength intact in all 4 Skin: moist, warm, no rashes  ASSESSMENT: 1. DM2, insulin-dependent, uncontrolled, with complications - CAD, s/p stents - Dr. Ron Parker. He had stenting of distal RCA/PDA in 07/2013. Prior stenting of LAD in 04/2013. - AAA - CKD stage 3  PLAN:  1. Patient with improving diabetes control after switching to basal-bolus insulin regimen. HbA1c has decreased nicely up  to last visit, now sugars slightly higher. Will increase insulin with B'fast and dinner - see below. - I advised him to: Patient Instructions   Please change ReliOn insulin doses: Insulin Before breakfast Before lunch Before dinner  Regular 25 >> 30 25 25  >> 30  NPH 40 >> 45  40    Please check some sugars at bedtime, also.  Please come back for a follow-up appointment in 3 months.  - He is up-to-date with flu shot - UTD with eye exams - HbA1c today: 7.9% (stable, but higher than target) - I will see the patient back in 3 mo with his sugar log

## 2015-11-28 NOTE — Patient Instructions (Signed)
Please change ReliOn insulin doses: Insulin Before breakfast Before lunch Before dinner  Regular 25 >> 30 25 25  >> 30  NPH 40 >> 45  40    Please check some sugars at bedtime, also.  Please come back for a follow-up appointment in 3 months.

## 2015-12-02 ENCOUNTER — Other Ambulatory Visit: Payer: Self-pay | Admitting: Family Medicine

## 2015-12-25 ENCOUNTER — Ambulatory Visit (INDEPENDENT_AMBULATORY_CARE_PROVIDER_SITE_OTHER): Payer: Commercial Managed Care - HMO | Admitting: Cardiology

## 2015-12-25 ENCOUNTER — Encounter: Payer: Self-pay | Admitting: Cardiology

## 2015-12-25 VITALS — BP 104/60 | HR 76 | Ht 70.5 in | Wt 258.0 lb

## 2015-12-25 DIAGNOSIS — I2510 Atherosclerotic heart disease of native coronary artery without angina pectoris: Secondary | ICD-10-CM | POA: Diagnosis not present

## 2015-12-25 DIAGNOSIS — I714 Abdominal aortic aneurysm, without rupture, unspecified: Secondary | ICD-10-CM

## 2015-12-25 NOTE — Patient Instructions (Signed)
Medication Instructions:   STOP PLAVIX  Testing/Procedures:  Your physician has requested that you have an abdominal aorta duplex. During this test, an ultrasound is used to evaluate the aorta. Allow 30 minutes for this exam. Do not eat after midnight the day before and avoid carbonated beverages   Follow-Up:  Your physician wants you to follow-up in: [place] will receive a reminder letter in the mail two months in advance. If you don't receive a letter, please call our office to schedule the follow-up appointment.

## 2015-12-25 NOTE — Assessment & Plan Note (Signed)
Continue statin. 

## 2015-12-25 NOTE — Assessment & Plan Note (Signed)
Plan follow-up abdominal ultrasound.

## 2015-12-25 NOTE — Assessment & Plan Note (Signed)
Continue aspirin and statin.It has been greater than 1 year since his previous stent. Discontinue Plavix.

## 2015-12-25 NOTE — Assessment & Plan Note (Signed)
Blood pressure controlled. Continue present medications. 

## 2015-12-25 NOTE — Progress Notes (Signed)
HPI: FU CAD; previously followed by Dr Ron Parker. Echocardiogram August 2014 showed normal LV systolic function, grade 1 diastolic dysfunction and mild right ventricular enlargement. Pt has had previous PCI of his LAD. Last catheterization in October 2014 showed normal left main, patent stent in the LAD, 30-40% proximal circumflex and 90% ostial PDA. Patient had drug-eluting stent to the PDA at that time. Lower extremity Dopplers January 2015 normal. Abdominal CT March 2016 showed mild aneurysmal dilatation of the abdominal aorta to 3.2 cm. Since last seen, the patient has dyspnea with more extreme activities but not with routine activities. It is relieved with rest. It is not associated with chest pain. There is no orthopnea, PND or pedal edema. There is no syncope or palpitations. There is no exertional chest pain.   Current Outpatient Prescriptions  Medication Sig Dispense Refill  . acetaminophen (TYLENOL) 500 MG tablet Take 1,000 mg by mouth 2 (two) times daily.    Marland Kitchen albuterol (PROVENTIL HFA;VENTOLIN HFA) 108 (90 BASE) MCG/ACT inhaler Inhale 2 puffs into the lungs every 6 (six) hours as needed for wheezing or shortness of breath. 18 Inhaler 2  . Albuterol Sulfate (PROAIR RESPICLICK) 194 (90 BASE) MCG/ACT AEPB Inhale 2 puffs into the lungs every 4 (four) hours as needed. 1 each 3  . allopurinol (ZYLOPRIM) 300 MG tablet TAKE 1 TABLET EVERY DAY 90 tablet 1  . aspirin EC 81 MG tablet Take 81 mg by mouth daily.    Marland Kitchen atorvastatin (LIPITOR) 40 MG tablet TAKE 1 TABLET EVERY DAY 90 tablet 2  . BD INSULIN SYRINGE ULTRAFINE 31G X 5/16" 1 ML MISC Use to inject insulin 5 times daily as instructed. Dx: E11.9 450 each 3  . Blood Glucose Monitoring Suppl (BLOOD GLUCOSE METER) kit Use as instructed 1 each 0  . Blood Glucose Monitoring Suppl (TRUE METRIX AIR GLUCOSE METER) DEVI 1 each by Does not apply route daily. 1 Device 0  . Cholecalciferol (VITAMIN D) 1000 UNITS capsule Take 1,000 Units by mouth at  bedtime.     . clopidogrel (PLAVIX) 75 MG tablet TAKE 1 TABLET EVERY DAY WITH BREAKFAST 90 tablet 3  . cyclobenzaprine (FLEXERIL) 5 MG tablet 1 tab po q hs prn muscle spasms 30 tablet 0  . fenofibrate 160 MG tablet TAKE 1 TABLET EVERY DAY 90 tablet 1  . fluocinonide cream (LIDEX) 1.74 % Apply 1 application topically daily as needed. FOR RASH    . fluticasone (FLONASE) 50 MCG/ACT nasal spray Place 2 sprays into both nostrils daily. (Patient taking differently: Place 2 sprays into both nostrils as needed. ) 16 g 1  . furosemide (LASIX) 80 MG tablet TAKE 1 TABLET EVERY DAY 90 tablet 1  . glucose blood (TRUE METRIX BLOOD GLUCOSE TEST) test strip Use to test blood sugar 4 times daily as instructed. Dx: E11.9 400 each 2  . glucose blood (TRUETEST TEST) test strip Use to test blood sugar 3 times daily as instructed. Dx code: E11.9 and Lancets 3/day 275 each 3  . HYDROcodone-acetaminophen (NORCO) 5-325 MG tablet Take 1 tablet by mouth every 6 (six) hours as needed for moderate pain. 24 tablet 0  . insulin NPH Human (NOVOLIN N) 100 UNIT/ML injection Inject 40-45 units 2x a day 70 mL 1  . Insulin Pen Needle (PEN NEEDLES 31GX5/16") 31G X 8 MM MISC Use to inject insulin 2 times daily as instructed. 200 each 2  . insulin regular (NOVOLIN R RELION) 100 units/mL injection Inject 0.25-0.3 mLs (25-30 Units  total) into the skin 3 (three) times daily before meals. 70 mL 1  . isosorbide mononitrate (IMDUR) 30 MG 24 hr tablet TAKE 1 TABLET EVERY DAY 90 tablet 3  . metoprolol (LOPRESSOR) 50 MG tablet TAKE 1 TABLET TWICE DAILY 180 tablet 1  . nitroGLYCERIN (NITROSTAT) 0.4 MG SL tablet Place 1 tablet (0.4 mg total) under the tongue every 5 (five) minutes as needed for chest pain. 25 tablet 3  . Omega-3 Fatty Acids (FISH OIL PO) Take by mouth daily.    Marland Kitchen oxybutynin (DITROPAN) 5 MG tablet Take 1 tablet by mouth 2 (two) times daily.    . pantoprazole (PROTONIX) 20 MG tablet Take 20 mg by mouth daily.    . predniSONE  (DELTASONE) 10 MG tablet 3 tabs x3 days and then 2 tabs x3 days and then 1 tab x3 days.  Take w/ food. 18 tablet 0  . tamsulosin (FLOMAX) 0.4 MG CAPS capsule TAKE 1 CAPSULE EVERY DAY 90 capsule 1  . TRUEPLUS LANCETS 28G MISC Use to test blood glucose 4 times daily as instructed. Dx code: E11.9 400 each 2   No current facility-administered medications for this visit.     Past Medical History  Diagnosis Date  . Obesity   . DM (diabetes mellitus) ([place])   . Gout   . Skin cancer   . CAD (coronary artery disease)     a. Stent 2000. b. Several caths since then with nonobst dz. c. 05/2013: IVUS-guided PTCA/DES to proximal-to-mid LAD, residual PDA disease for med rx unless recurrent angina. EF 55-60%.  . Ventral hernia   . Sinusitis   . Back problem   . Hyperlipidemia   . HTN (hypertension)   . Ejection fraction     55%, 07/2010, mild inferior hypo  . History of pneumonia   . Melanoma ([place])     Melanoma removed from the left forearm with wide excision May, 2012  . Sleep apnea     CPAP,  [name] since 2000  . GERD (gastroesophageal reflux disease)   . H/O hiatal hernia   . Arthritis   . Spinal stenosis     a. s/p surgical repair 2013  . Positive D-dimer     a. significant elevation ,hospital 07/2010, etiology unclear. b. D Dimer chronically > 20.  Marland Kitchen CKD (chronic kidney disease)      CKD stage III.. creatinine up to 1.5 in hospital after dye in October, 2011  . Nephrolithiasis   . CHF (congestive heart failure) ([place])   . AAA (abdominal aortic aneurysm) ([place]) 12-15-14    Mild aneurysmal dilatation of the infrarenal abdominal aorta 3.2 cm   . H/O diverticulitis of colon 01/31/2015    Past Surgical History  Procedure Laterality Date  . Nose surgery    . Cardiac catheterization    . Coronary stent placement  2000    CAD  . Knee arthroscopy  90's    rt  . Cervical disc surgery  90's  . Lumbar laminectomy/decompression microdiscectomy  08/30/2012    Procedure: LUMBAR  LAMINECTOMY/DECOMPRESSION MICRODISCECTOMY 2 LEVELS;  Surgeon: [name], MD;  Location: [place] [facility]URO ORS;  Service: Neurosurgery;  Laterality: Bilateral;  Bilateral Lumbar three-four Lumbar four-five Laminotomies  . Back surgery    . Coronary angioplasty with stent placement  07/26/2013    DES to RCA extending to PDA    . Left heart catheterization with coronary angiogram N/A 05/19/2013    Procedure: LEFT HEART CATHETERIZATION WITH CORONARY ANGIOGRAM;  Surgeon: Kirk Ruths  Claris Gladden, MD;  Location: [facility] CATH LAB;  Service: Cardiovascular;  Laterality: N/A;  . Percutaneous coronary stent intervention (pci-s)  05/19/2013    Procedure: PERCUTANEOUS CORONARY STENT INTERVENTION (PCI-S);  Surgeon: [name], MD;  Location: [facility] CATH LAB;  Service: Cardiovascular;;  . Left and right heart catheterization with coronary angiogram N/A 07/26/2013    Procedure: LEFT AND RIGHT HEART CATHETERIZATION WITH CORONARY ANGIOGRAM;  Surgeon: [name], MD;  Location: [place] CATH LAB;  Service: Cardiovascular;  Laterality: N/A;    Social History   Social History  . Marital Status: Married    Spouse Name: N/A  . Number of Children: N/A  . Years of Education: N/A   Occupational History  . [employer]    Social History Main Topics  . Smoking status: Former Smoker -- 3.00 packs/day for 30 years    Types: Cigarettes    Quit date: 09/17/1996  . Smokeless tobacco: Never Used  . Alcohol Use: No  . Drug Use: No  . Sexual Activity: Not Currently    Birth Control/ Protection: None   Other Topics Concern  . Not on file   Social History Narrative   Married and lives locally with his wife.  [employer].    Family History  Problem Relation Age of Onset  . Prostate cancer    . Kidney cancer    . Cancer      Bladder cancer  . Coronary artery disease    . Cancer Mother     intestinal   . Heart disease Mother   . Cancer Father     prostate  . Migraines Daughter   . Leukemia Sister   . Heart disease Sister   . Heart  attack Neg Hx   . Stroke Neg Hx   . Hypertension Sister   . Hypertension Brother     ROS: no fevers or chills, productive cough, hemoptysis, dysphasia, odynophagia, melena, hematochezia, dysuria, hematuria, rash, seizure activity, orthopnea, PND, pedal edema, claudication. Remaining systems are negative.  Physical Exam: Well-developed obese in no acute distress.  Skin is warm and dry.  HEENT is normal.  Neck is supple.  Chest is clear to auscultation with normal expansion.  Cardiovascular exam is regular rate and rhythm.  Abdominal exam nontender or distended. No masses palpated. Extremities show no edema. neuro grossly intact  ECG Sinus rhythm with occasional PVC. Left anterior fascicular block. RV conduction delay. Nonspecific T-wave changes.

## 2016-01-01 ENCOUNTER — Ambulatory Visit (HOSPITAL_BASED_OUTPATIENT_CLINIC_OR_DEPARTMENT_OTHER)
Admission: RE | Admit: 2016-01-01 | Discharge: 2016-01-01 | Disposition: A | Discharge status: Home / Self Care | Payer: Commercial Managed Care - HMO | Source: Ambulatory Visit | Attending: Cardiology | Admitting: Cardiology

## 2016-01-01 DIAGNOSIS — I714 Abdominal aortic aneurysm, without rupture, unspecified: Secondary | ICD-10-CM

## 2016-01-08 ENCOUNTER — Ambulatory Visit: Payer: Commercial Managed Care - HMO | Admitting: Cardiology

## 2016-01-27 ENCOUNTER — Other Ambulatory Visit: Payer: Self-pay | Admitting: Internal Medicine

## 2016-02-25 ENCOUNTER — Other Ambulatory Visit: Payer: Self-pay | Admitting: Family Medicine

## 2016-02-26 ENCOUNTER — Encounter: Payer: Self-pay | Admitting: Internal Medicine

## 2016-02-26 ENCOUNTER — Other Ambulatory Visit (INDEPENDENT_AMBULATORY_CARE_PROVIDER_SITE_OTHER): Payer: Commercial Managed Care - HMO | Admitting: *Deleted

## 2016-02-26 ENCOUNTER — Ambulatory Visit (INDEPENDENT_AMBULATORY_CARE_PROVIDER_SITE_OTHER): Payer: Commercial Managed Care - HMO | Admitting: Internal Medicine

## 2016-02-26 VITALS — BP 122/78 | HR 76 | Temp 98.5°F | Resp 12 | Wt 265.0 lb

## 2016-02-26 DIAGNOSIS — Z794 Long term (current) use of insulin: Secondary | ICD-10-CM

## 2016-02-26 DIAGNOSIS — IMO0002 Reserved for concepts with insufficient information to code with codable children: Secondary | ICD-10-CM

## 2016-02-26 DIAGNOSIS — E1159 Type 2 diabetes mellitus with other circulatory complications: Secondary | ICD-10-CM

## 2016-02-26 DIAGNOSIS — E1165 Type 2 diabetes mellitus with hyperglycemia: Secondary | ICD-10-CM

## 2016-02-26 LAB — POCT GLYCOSYLATED HEMOGLOBIN (HGB A1C): HEMOGLOBIN A1C: 7.8

## 2016-02-26 NOTE — Progress Notes (Signed)
Patient ID: William Hendrix, male   DOB: 10-05-1941, 75 y.o.   MRN: [id number]  HPI: William Hendrix is a 75 y.o.-year-old male, returning for f/u for DM2, dx 2009, insulin-dependent, uncontrolled, with complications (CAD, CKD stage 3). Last visit 3 mo ago.  Last hemoglobin A1c was: Lab Results  Component Value Date   HGBA1C 7.9 11/28/2015   HGBA1C 7.8* 07/04/2015   HGBA1C 8.3* 03/28/2015   He is now on: ReliOn insulin: Insulin Before breakfast Before lunch Before dinner  Regular 25 >> 30 25 25  >> 30  NPH 40 >> 45  40    We cannot use Metformin 2/2 CKD. We stopped Januvia at last visit >> could not afford ($100/3 mo)  We cannot use Levemir >> developed a severe skin reaction to Levemir  We stopped Amaryl 4 mg.  He brings his log. He checks 3 times a day - forgot log >> feels sugars are a little better but cannot recall specific CBGs. Reviewed the ones from last visit: - am: 169-228 >> 151-192 >> 145-176  >> 200-280 >> 152-220 >> 95-162 >> 120-169 >> 114, 147-178 - Before lunch: 137-220 >> 85-147 >> 219-292 >> 189-260 >> 77, 82-167 >> 95-149, 183 >> 146-179 - before dinner: 65, 71, 109-148, 168 >> 73, 85-165, 207-247 if skips lunch >> 109-173, 191, 225 - Before bedtime, highest sugars at that day, 175-240 >> 157-179 >> 102 >> n/c No lows. Lowest sugar was 150 >> 65 >> 73 >> 102 ,  he does not know whether he has hypoglycemia awareness. Highest sugar was 300s >> 180 >> 247 >> 225 >> 220 (cake).  Pt has chronic kidney disease, last BUN/creatinine was:  Lab Results  Component Value Date   BUN 30* 10/01/2015   CREATININE 1.49 10/01/2015  ACR 0.7. Last lipids: Lab Results  Component Value Date   CHOL 121 07/04/2015   HDL 22.70* 07/04/2015   LDLCALC 31 04/09/2014   LDLDIRECT 55.0 07/04/2015   TRIG 306.0* 07/04/2015   CHOLHDL 5 07/04/2015   Pt's last eye exam was 09/30/2015. No DR. Had cataract sx 6 years ago.  Denies numbness and tingling in his legs.  His daughter has acute  pancreatitis >> she has a tight sphincter of Oddi - has been admitted 8x for it  PMH: Pt also also has a history of CAD, sees Dr. Ron Parker (but will change Dr's soon as he retires), hypertension, hyperlipidemia, chronic kidney disease stage III, history of nephrolithiasis, obesity, hiatal hernia, obstructive sleep apnea, hyperglyceridemia, melanoma, spinal stenosis.  ROS: Constitutional: no  weight gain, no fatigue, no subjective hyperthermia/hypothermia Eyes: no blurry vision, no xerophthalmia ENT: no sore throat, no nodules palpated in throat, no dysphagia/odynophagia, no hoarseness Cardiovascular: no CP/SOB/no palpitations/+ leg swelling Respiratory: no cough/SOB Gastrointestinal: no N/V/D/C Musculoskeletal: no muscle/joint aches Skin: no rashes  I reviewed pt's medications, allergies, PMH, social hx, family hx, and changes were documented in the history of present illness. Otherwise, unchanged from my initial visit note.  PE: BP 122/78 mmHg  Pulse 76  Temp(Src) 98.5 F (36.9 C) (Oral)  Resp 12  Wt 265 lb (120.203 kg)  SpO2 95% Body mass index is 37.47 kg/(m^2). Wt Readings from Last 3 Encounters:  02/26/16 265 lb (120.203 kg)  12/25/15 258 lb (117.028 kg)  11/28/15 266 lb (120.657 kg)  Constitutional: overweight, in NAD Eyes: PERRLA, EOMI, no exophthalmos ENT: moist mucous membranes, no thyromegaly, no cervical lymphadenopathy Cardiovascular: RRR, No MRG, + leg swelling B Respiratory: CTA B  Gastrointestinal: abdomen soft, NT, ND, BS+ Musculoskeletal: no deformities, strength intact in all 4 Skin: moist, warm, no rashes  ASSESSMENT: 1. DM2, insulin-dependent, uncontrolled, with complications - CAD, s/p stents - Dr. Ron Parker. He had stenting of distal RCA/PDA in 07/2013. Prior stenting of LAD in 04/2013. - AAA - CKD stage 3  PLAN:  1. Patient with improving diabetes control after switching to basal-bolus insulin regimen, but sugars are still above goal, despite increasing  insulin doses at last visit. He eats out frequently (almost all meals) which I believe is a big pb  - he also gained weight and has more SOB... Strongly advised him to eat out less frequently or cut the portions in half. - I advised him to: Patient Instructions   Please continue ReliOn insulin doses: Insulin Before breakfast Before lunch Before dinner  Regular 30 25 30   NPH 45  40    Please check some sugars at bedtime, also.  Please come back for a follow-up appointment in 3 months.  - He is up-to-date with flu shot - UTD with eye exams - HbA1c today: 7.8% (stable, but higher than target) - I will see the patient back in 3 mo with his sugar log

## 2016-02-26 NOTE — Patient Instructions (Addendum)
  Please continue ReliOn insulin doses: Insulin Before breakfast Before lunch Before dinner  Regular 30 25 30   NPH 45  40    Please try to work on the diet >> limit eating out or limit portions.  Please come back for a follow-up appointment in 3 months.

## 2016-03-05 ENCOUNTER — Ambulatory Visit (HOSPITAL_BASED_OUTPATIENT_CLINIC_OR_DEPARTMENT_OTHER)
Admission: RE | Admit: 2016-03-05 | Discharge: 2016-03-05 | Disposition: A | Discharge status: Home / Self Care | Payer: Commercial Managed Care - HMO | Source: Ambulatory Visit | Attending: Medical | Admitting: Medical

## 2016-03-05 ENCOUNTER — Encounter: Payer: Self-pay | Admitting: Medical

## 2016-03-05 ENCOUNTER — Ambulatory Visit (INDEPENDENT_AMBULATORY_CARE_PROVIDER_SITE_OTHER): Payer: Commercial Managed Care - HMO | Admitting: Medical

## 2016-03-05 VITALS — BP 118/74 | HR 60 | Temp 97.9°F | Ht 70.5 in | Wt 267.4 lb

## 2016-03-05 DIAGNOSIS — M5416 Radiculopathy, lumbar region: Secondary | ICD-10-CM

## 2016-03-05 DIAGNOSIS — M4806 Spinal stenosis, lumbar region: Secondary | ICD-10-CM

## 2016-03-05 DIAGNOSIS — I739 Peripheral vascular disease, unspecified: Secondary | ICD-10-CM | POA: Insufficient documentation

## 2016-03-05 DIAGNOSIS — M47814 Spondylosis without myelopathy or radiculopathy, thoracic region: Secondary | ICD-10-CM | POA: Diagnosis not present

## 2016-03-05 DIAGNOSIS — M542 Cervicalgia: Secondary | ICD-10-CM

## 2016-03-05 DIAGNOSIS — M25559 Pain in unspecified hip: Secondary | ICD-10-CM | POA: Diagnosis not present

## 2016-03-05 DIAGNOSIS — M546 Pain in thoracic spine: Secondary | ICD-10-CM

## 2016-03-05 DIAGNOSIS — I6529 Occlusion and stenosis of unspecified carotid artery: Secondary | ICD-10-CM | POA: Diagnosis not present

## 2016-03-05 DIAGNOSIS — M48061 Spinal stenosis, lumbar region without neurogenic claudication: Secondary | ICD-10-CM

## 2016-03-05 DIAGNOSIS — Z955 Presence of coronary angioplasty implant and graft: Secondary | ICD-10-CM | POA: Insufficient documentation

## 2016-03-05 DIAGNOSIS — M47812 Spondylosis without myelopathy or radiculopathy, cervical region: Secondary | ICD-10-CM | POA: Diagnosis not present

## 2016-03-05 DIAGNOSIS — M160 Bilateral primary osteoarthritis of hip: Secondary | ICD-10-CM | POA: Diagnosis not present

## 2016-03-05 MED ORDER — HYDROCODONE-ACETAMINOPHEN 5-325 MG PO TABS: 1.0000 | ORAL_TABLET | Freq: Four times a day (QID) | ORAL | Status: DC | PRN

## 2016-03-05 MED ORDER — PANTOPRAZOLE SODIUM 20 MG PO TBEC: 20.0000 mg | DELAYED_RELEASE_TABLET | Freq: Every day | ORAL | Status: DC

## 2016-03-05 MED ORDER — CYCLOBENZAPRINE HCL 10 MG PO TABS: 10.0000 mg | ORAL_TABLET | Freq: Every day | ORAL | Status: DC

## 2016-03-05 NOTE — Patient Instructions (Signed)
For your varies areas of moderate to severe pain rx norco and flexeril.  We will get xrays of cspine, tspine and hips today.  I will go ahead and order mri of lumbar spine and see if radiologist will compare to mri of 2013. May refer you back to your neurosurgeon in near future.  Red flags reviewed of symptoms that would necessitate ED evaluation.  Follow up in 10-14 days or as needed

## 2016-03-05 NOTE — Progress Notes (Signed)
Pre visit review using our clinic review tool, if applicable. No additional management support is needed unless otherwise documented below in the visit note. 

## 2016-03-05 NOTE — Progress Notes (Signed)
Subjective:    Patient ID: William Hendrix, male    DOB: 1941-04-06, 75 y.o.   MRN: [id number]  HPI   Pt in for lower  back area pain, both  Hips hurt and left trapezius pain. Lower back and hips hurt worse than other areas. Pt wife states over the years x-rays have been negative. Also he had vascular studies of legs done and those were negative.   Pt has had surgery of his lower back surgery. Pt had surgery maybe 5-6 years ago.  Also lower neck and upper tspine junction pain. Some pain radiates to left shoulder.  Pain severe side recently. Over past 2 wks. No saddle anesthesia. No incontenence. No foot drop.  But pain has been on and off moderate in lumbar spine for years with occasional flares per pt.   IMPRESSION: MRI 2013. Improvement in epidural lipomatosis due to interval weight loss.  Mild to moderate spinal stenosis L2-3, slightly progressive.  Severe spinal stenosis at L3-4 is similar to the prior study. There is slight improvement in the central disc protrusion.  Moderate to severe spinal stenosis at L4-5 is unchanged.     Review of Systems  Constitutional: Negative for fever, chills and fatigue.  Respiratory: Negative for chest tightness, shortness of breath and wheezing.   Cardiovascular: Negative for chest pain and palpitations.  Musculoskeletal: Positive for back pain.       Hips, neck and lower lumbar pain.  Skin: Negative for rash.  Neurological: Negative for dizziness and headaches.  Hematological: Negative for adenopathy.  Psychiatric/Behavioral: Negative for behavioral problems and confusion.    Past Medical History  Diagnosis Date  . Obesity   . DM (diabetes mellitus) ([place])   . Gout   . Skin cancer   . CAD (coronary artery disease)     a. Stent 2000. b. Several caths since then with nonobst dz. c. 05/2013: IVUS-guided PTCA/DES to proximal-to-mid LAD, residual PDA disease for med rx unless recurrent angina. EF 55-60%.  . Ventral hernia   .  Sinusitis   . Back problem   . Hyperlipidemia   . HTN (hypertension)   . Ejection fraction     55%, 07/2010, mild inferior hypo  . History of pneumonia   . Melanoma ([place])     Melanoma removed from the left forearm with wide excision May, 2012  . Sleep apnea     CPAP,  [name] since 2000  . GERD (gastroesophageal reflux disease)   . H/O hiatal hernia   . Arthritis   . Spinal stenosis     a. s/p surgical repair 2013  . Positive D-dimer     a. significant elevation ,hospital 07/2010, etiology unclear. b. D Dimer chronically > 20.  Marland Kitchen CKD (chronic kidney disease)      CKD stage III.. creatinine up to 1.5 in hospital after dye in October, 2011  . Nephrolithiasis   . CHF (congestive heart failure) ([place])   . AAA (abdominal aortic aneurysm) ([place]) 12-15-14    Mild aneurysmal dilatation of the infrarenal abdominal aorta 3.2 cm   . H/O diverticulitis of colon 01/31/2015     Social History   Social History  . Marital Status: Married    Spouse Name: N/A  . Number of Children: N/A  . Years of Education: N/A   Occupational History  . William Hend[employer]    Social History Main Topics  . Smoking status: Former Smoker -- 3.00 packs/day for 30 years    Types: Cigarettes  Quit date: 09/17/1996  . Smokeless tobacco: Never Used  . Alcohol Use: No  . Drug Use: No  . Sexual Activity: Not Currently    Birth Control/ Protection: None   Other Topics Concern  . Not on file   Social History Narrative   Married and lives locally with his wife.  William Hendrix.    Past Surgical History  Procedure Laterality Date  . Nose surgery    . Cardiac catheterization    . Coronary stent placement  2000    CAD  . Knee arthroscopy  90's    rt  . Cervical disc surgery  90's  . Lumbar laminectomy/decompression microdiscectomy  08/30/2012    Procedure: LUMBAR LAMINECTOMY/DECOMPRESSION MICRODISCECTOMY 2 LEVELS;  Surgeon: William Miss, MD;  Location: [place] [facility] OR[facility];  Service: Neurosurgery;  Laterality: Bilateral;   Bilateral Lumbar three-four Lumbar four-five Laminotomies  . Back surgery    . Coronary angioplasty with stent placement  07/26/2013    DES to RCA extending to PDA    . Left heart catheterization with coronary angiogram N/A 05/19/2013    Procedure: LEFT HEART CATHETERIZATION WITH CORONARY ANGIOGRAM;  Surgeon: William Dresser, MD;  Location: [facility] CATH LAB;  Service: Cardiovascular;  Laterality: N/A;  . Percutaneous coronary stent intervention (pci-s)  05/19/2013    Procedure: PERCUTANEOUS CORONARY STENT INTERVENTION (PCI-S);  Surgeon: William Dresser, MD;  Location: [facility] CATH LAB;  Service: Cardiovascular;;  . Left and right heart catheterization with coronary angiogram N/A 07/26/2013    Procedure: LEFT AND RIGHT HEART CATHETERIZATION WITH CORONARY ANGIOGRAM;  Surgeon: William Hampshire, MD;  Location: [facility] CATH LAB;  Service: Cardiovascular;  Laterality: N/A;    Family History  Problem Relation Age of Onset  . Prostate cancer    . Kidney cancer    . Cancer      Bladder cancer  . Coronary artery disease    . Cancer Mother     intestinal   . Heart disease Mother   . Cancer Father     prostate  . Migraines Daughter   . Leukemia Sister   . Heart disease Sister   . Heart attack Neg Hx   . Stroke Neg Hx   . Hypertension Sister   . Hypertension Brother     Allergies  Allergen Reactions  . Levemir [Insulin Detemir] Swelling    Patient had redness and swelling and tenderness at injection site.  . Codeine Other (See Comments)    Makes him "shakey", is OK with hydrocodone GI UPSET & TREMORS  . Lipitor [Atorvastatin] Other (See Comments)    Muscle aches; liver functions. Patient is currently taken  . Novolog Mix [Insulin Aspart Prot & Aspart]     Causes skin to swell/itch at injection site    Current Outpatient Prescriptions on File Prior to Visit  Medication Sig Dispense Refill  . acetaminophen (TYLENOL) 500 MG tablet Take 1,000 mg by mouth 2 (two) times daily.    Marland Kitchen albuterol (PROVENTIL  HFA;VENTOLIN HFA) 108 (90 BASE) MCG/ACT inhaler Inhale 2 puffs into the lungs every 6 (six) hours as needed for wheezing or shortness of breath. 18 Inhaler 2  . Albuterol Sulfate (PROAIR RESPICLICK) 220 (90 BASE) MCG/ACT AEPB Inhale 2 puffs into the lungs every 4 (four) hours as needed. 1 each 3  . allopurinol (ZYLOPRIM) 300 MG tablet TAKE 1 TABLET EVERY DAY 90 tablet 1  . aspirin EC 81 MG tablet Take 81 mg by mouth daily.    Marland Kitchen atorvastatin (LIPITOR) 40  MG tablet TAKE 1 TABLET EVERY DAY 90 tablet 2  . BD INSULIN SYRINGE ULTRAFINE 31G X 5/16" 1 ML MISC USE  TO INJECT  INSULIN  FIVE  TIMES  DAILY AS INSTRUCTED 450 each 3  . Blood Glucose Monitoring Suppl (BLOOD GLUCOSE METER) kit Use as instructed 1 each 0  . Blood Glucose Monitoring Suppl (TRUE METRIX AIR GLUCOSE METER) DEVI 1 each by Does not apply route daily. 1 Device 0  . Cholecalciferol (VITAMIN D) 1000 UNITS capsule Take 1,000 Units by mouth at bedtime.     . cyclobenzaprine (FLEXERIL) 5 MG tablet 1 tab po q hs prn muscle spasms 30 tablet 0  . fenofibrate 160 MG tablet TAKE 1 TABLET EVERY DAY 90 tablet 1  . fluocinonide cream (LIDEX) 8.56 % Apply 1 application topically daily as needed. FOR RASH    . fluticasone (FLONASE) 50 MCG/ACT nasal spray Place 2 sprays into both nostrils daily. (Patient taking differently: Place 2 sprays into both nostrils as needed. ) 16 g 1  . furosemide (LASIX) 80 MG tablet TAKE 1 TABLET EVERY DAY 90 tablet 1  . glucose blood (TRUE METRIX BLOOD GLUCOSE TEST) test strip Use to test blood sugar 4 times daily as instructed. Dx: E11.9 400 each 2  . glucose blood (TRUETEST TEST) test strip Use to test blood sugar 3 times daily as instructed. Dx code: E11.9 and Lancets 3/day 275 each 3  . HYDROcodone-acetaminophen (NORCO) 5-325 MG tablet Take 1 tablet by mouth every 6 (six) hours as needed for moderate pain. 24 tablet 0  . insulin NPH Human (NOVOLIN N) 100 UNIT/ML injection Inject 40-45 units 2x a day 70 mL 1  . Insulin  Pen Needle (PEN NEEDLES 31GX5/16") 31G X 8 MM MISC Use to inject insulin 2 times daily as instructed. 200 each 2  . insulin regular (NOVOLIN R RELION) 100 units/mL injection Inject 0.25-0.3 mLs (25-30 Units total) into the skin 3 (three) times daily before meals. 70 mL 1  . isosorbide mononitrate (IMDUR) 30 MG 24 hr tablet TAKE 1 TABLET EVERY DAY 90 tablet 3  . metoprolol (LOPRESSOR) 50 MG tablet TAKE 1 TABLET TWICE DAILY 180 tablet 1  . nitroGLYCERIN (NITROSTAT) 0.4 MG SL tablet Place 1 tablet (0.4 mg total) under the tongue every 5 (five) minutes as needed for chest pain. 25 tablet 3  . Omega-3 Fatty Acids (FISH OIL PO) Take by mouth daily.    Marland Kitchen oxybutynin (DITROPAN) 5 MG tablet Take 1 tablet by mouth 2 (two) times daily.    . tamsulosin (FLOMAX) 0.4 MG CAPS capsule TAKE 1 CAPSULE EVERY DAY 90 capsule 1  . TRUEPLUS LANCETS 28G MISC Use to test blood glucose 4 times daily as instructed. Dx code: E11.9 400 each 2   No current facility-administered medications on file prior to visit.    BP 118/74 mmHg  Pulse 60  Temp(Src) 97.9 F (36.6 C) (Oral)  Ht 5' 10.5" (1.791 m)  Wt 267 lb 6.4 oz (121.292 kg)  BMI 37.81 kg/m2  SpO2 96%       Objective:   Physical Exam  General Appearance- Not in acute distress.  Neck- left side trapezius tender. Faint mid cspine tender.  Upper back- upper tspine tender to palpation junctin of cspine.  Chest and Lung Exam Auscultation: Breath sounds:-Normal. Clear even and unlabored. Adventitious sounds:- No Adventitious sounds.  Cardiovascular Auscultation:Rythm - Regular, rate and rythm. Heart Sounds -Normal heart sounds.  Abdomen Inspection:-Inspection Normal.  Palpation/Perucssion: Palpation and Percussion of  the abdomen reveal- Non Tender, No Rebound tenderness, No rigidity(Guarding) and No Palpable abdominal masses.  Liver:-Normal.  Spleen:- Normal.   Back Mid lumbar spine tenderness to palpation. Pain on straight leg lift. Pain on lateral  movements and flexion/extension of the spine.  Lower ext neurologic  L5-S1 sensation intact bilaterally. Normal patellar reflexes bilaterally. No foot drop bilaterally.  Hips- both hips pain on rom. Left side worse.        Assessment & Plan:  For your varies areas of moderate to severe pain rx norco and flexeril.  We will get xrays of cspine, tspine and hips today.  I will go ahead and order mri of lumbar spine and see if radiologist will compare to mri of 2013. May refer you back to your neurosurgeon in near future.  Red flags reviewed of symptoms that would necessitate ED evaluation.  Follow up in 10-14 days or as needed  Abri Vacca, Percell Miller, [employer]

## 2016-03-14 ENCOUNTER — Ambulatory Visit (HOSPITAL_BASED_OUTPATIENT_CLINIC_OR_DEPARTMENT_OTHER)
Admission: RE | Admit: 2016-03-14 | Discharge: 2016-03-14 | Disposition: A | Discharge status: Home / Self Care | Payer: Commercial Managed Care - HMO | Source: Ambulatory Visit | Attending: Medical | Admitting: Medical

## 2016-03-14 ENCOUNTER — Telehealth: Payer: Self-pay | Admitting: Medical

## 2016-03-14 DIAGNOSIS — M4807 Spinal stenosis, lumbosacral region: Secondary | ICD-10-CM | POA: Insufficient documentation

## 2016-03-14 DIAGNOSIS — M5440 Lumbago with sciatica, unspecified side: Secondary | ICD-10-CM

## 2016-03-14 DIAGNOSIS — M5126 Other intervertebral disc displacement, lumbar region: Secondary | ICD-10-CM | POA: Diagnosis not present

## 2016-03-14 DIAGNOSIS — M4806 Spinal stenosis, lumbar region: Secondary | ICD-10-CM | POA: Diagnosis not present

## 2016-03-14 DIAGNOSIS — M48061 Spinal stenosis, lumbar region without neurogenic claudication: Secondary | ICD-10-CM

## 2016-03-14 DIAGNOSIS — M5410 Radiculopathy, site unspecified: Secondary | ICD-10-CM

## 2016-03-14 NOTE — Telephone Encounter (Signed)
Referral to neurosurgeon placed. 

## 2016-03-16 ENCOUNTER — Telehealth: Payer: Self-pay | Admitting: Medical

## 2016-03-16 ENCOUNTER — Telehealth: Payer: Self-pay

## 2016-03-16 NOTE — Telephone Encounter (Signed)
Will try to refer to Dr. Ellene Route. Pt does not need to follow up with me this week if his symptoms are undercontrol.

## 2016-03-16 NOTE — Telephone Encounter (Signed)
Patient states he is still having pain and will be running out of pills. Will keep his appointment on Thursday.

## 2016-03-16 NOTE — Telephone Encounter (Signed)
Pt wants to be referred to [name] MD. If this is neurosurgeon then will you refer to him.

## 2016-03-16 NOTE — Telephone Encounter (Signed)
Patient would like to be seen by [name]. Would like to know if it is necessary for him to come for appointment on Thursday.  Reviewed results of MRI.

## 2016-03-17 NOTE — Telephone Encounter (Signed)
Referral was faxed to [facility] on 03/16/16, awaiting appt

## 2016-03-18 ENCOUNTER — Other Ambulatory Visit: Payer: Self-pay | Admitting: Family Medicine

## 2016-03-19 ENCOUNTER — Ambulatory Visit: Payer: Commercial Managed Care - HMO | Admitting: Medical

## 2016-03-19 DIAGNOSIS — M5126 Other intervertebral disc displacement, lumbar region: Secondary | ICD-10-CM | POA: Diagnosis not present

## 2016-03-19 DIAGNOSIS — M5412 Radiculopathy, cervical region: Secondary | ICD-10-CM | POA: Diagnosis not present

## 2016-03-19 DIAGNOSIS — Z6837 Body mass index (BMI) 37.0-37.9, adult: Secondary | ICD-10-CM | POA: Diagnosis not present

## 2016-03-19 DIAGNOSIS — R030 Elevated blood-pressure reading, without diagnosis of hypertension: Secondary | ICD-10-CM | POA: Diagnosis not present

## 2016-03-26 DIAGNOSIS — M50221 Other cervical disc displacement at C4-C5 level: Secondary | ICD-10-CM | POA: Diagnosis not present

## 2016-03-26 DIAGNOSIS — M5412 Radiculopathy, cervical region: Secondary | ICD-10-CM | POA: Diagnosis not present

## 2016-04-01 DIAGNOSIS — M5126 Other intervertebral disc displacement, lumbar region: Secondary | ICD-10-CM | POA: Diagnosis not present

## 2016-04-07 DIAGNOSIS — M5126 Other intervertebral disc displacement, lumbar region: Secondary | ICD-10-CM | POA: Diagnosis not present

## 2016-04-07 DIAGNOSIS — M5116 Intervertebral disc disorders with radiculopathy, lumbar region: Secondary | ICD-10-CM | POA: Diagnosis not present

## 2016-04-07 HISTORY — PX: ULNAR TUNNEL RELEASE: SHX820

## 2016-04-07 HISTORY — PX: LUMBAR SPINE SURGERY: SHX701

## 2016-04-11 HISTORY — PX: DENTAL SURGERY: SHX609

## 2016-05-03 ENCOUNTER — Emergency Department (HOSPITAL_BASED_OUTPATIENT_CLINIC_OR_DEPARTMENT_OTHER)
Admission: EM | Admit: 2016-05-03 | Discharge: 2016-05-03 | Disposition: A | Discharge status: Home / Self Care | Payer: Commercial Managed Care - HMO | Attending: Emergency Medicine | Admitting: Emergency Medicine

## 2016-05-03 DIAGNOSIS — E11649 Type 2 diabetes mellitus with hypoglycemia without coma: Secondary | ICD-10-CM | POA: Diagnosis not present

## 2016-05-03 DIAGNOSIS — I130 Hypertensive heart and chronic kidney disease with heart failure and stage 1 through stage 4 chronic kidney disease, or unspecified chronic kidney disease: Secondary | ICD-10-CM | POA: Insufficient documentation

## 2016-05-03 DIAGNOSIS — I509 Heart failure, unspecified: Secondary | ICD-10-CM | POA: Diagnosis not present

## 2016-05-03 DIAGNOSIS — E785 Hyperlipidemia, unspecified: Secondary | ICD-10-CM | POA: Insufficient documentation

## 2016-05-03 DIAGNOSIS — I2510 Atherosclerotic heart disease of native coronary artery without angina pectoris: Secondary | ICD-10-CM | POA: Diagnosis not present

## 2016-05-03 DIAGNOSIS — Z85828 Personal history of other malignant neoplasm of skin: Secondary | ICD-10-CM | POA: Diagnosis not present

## 2016-05-03 DIAGNOSIS — E162 Hypoglycemia, unspecified: Secondary | ICD-10-CM

## 2016-05-03 DIAGNOSIS — Z7982 Long term (current) use of aspirin: Secondary | ICD-10-CM | POA: Insufficient documentation

## 2016-05-03 DIAGNOSIS — Z87891 Personal history of nicotine dependence: Secondary | ICD-10-CM | POA: Insufficient documentation

## 2016-05-03 DIAGNOSIS — N183 Chronic kidney disease, stage 3 (moderate): Secondary | ICD-10-CM | POA: Insufficient documentation

## 2016-05-03 DIAGNOSIS — E1122 Type 2 diabetes mellitus with diabetic chronic kidney disease: Secondary | ICD-10-CM | POA: Insufficient documentation

## 2016-05-03 DIAGNOSIS — Z794 Long term (current) use of insulin: Secondary | ICD-10-CM | POA: Insufficient documentation

## 2016-05-03 DIAGNOSIS — Z79899 Other long term (current) drug therapy: Secondary | ICD-10-CM | POA: Insufficient documentation

## 2016-05-03 NOTE — ED Provider Notes (Signed)
[facility] DEPT [facility] Provider Note   CSN: [id number] Arrival date & time: 05/03/16  0120  First Provider Contact:  None       History   Chief Complaint No chief complaint on file.   HPI William Hendrix is a 75 y.o. male.  Patient is a 75 year old male with past history of diabetes. He presents for evaluation of low blood sugar. He states he began feeling lightheaded and shaky this evening. When he checked his sugar was 42. He then drank orange juice and his sugar has since improved. He denies any recent illness. He denies any changes in his medication or having taken additional or incorrect doses.      Past Medical History:  Diagnosis Date  . AAA (abdominal aortic aneurysm) ([place]) 12-15-14   Mild aneurysmal dilatation of the infrarenal abdominal aorta 3.2 cm   . Arthritis   . Back problem   . CAD (coronary artery disease)    a. Stent 2000. b. Several caths since then with nonobst dz. c. 05/2013: IVUS-guided PTCA/DES to proximal-to-mid LAD, residual PDA disease for med rx unless recurrent angina. EF 55-60%.  . CHF (congestive heart failure) ([place])   . CKD (chronic kidney disease)     CKD stage III.. creatinine up to 1.5 in hospital after dye in October, 2011  . DM (diabetes mellitus) ([place])   . Ejection fraction    55%, 07/2010, mild inferior hypo  . GERD (gastroesophageal reflux disease)   . Gout   . H/O diverticulitis of colon 01/31/2015  . H/O hiatal hernia   . History of pneumonia   . HTN (hypertension)   . Hyperlipidemia   . Melanoma ([place])    Melanoma removed from the left forearm with wide excision May, 2012  . Nephrolithiasis   . Obesity   . Positive D-dimer    a. significant elevation ,hospital 07/2010, etiology unclear. b. D Dimer chronically > 20.  . Sinusitis   . Skin cancer   . Sleep apnea    CPAP,  [name] since 2000  . Spinal stenosis    a. s/p surgical repair 2013  . Ventral hernia     Patient Active Problem List   Diagnosis Date Noted  .  Uncontrolled type 2 diabetes mellitus with circulatory disorder, with long-term current use of insulin ([place]) 11/28/2015  . Lumbar radiculopathy 10/09/2015  . Nail bed injury 05/22/2015  . Neuropathy, diabetic ([place]) 05/22/2015  . H/O diverticulitis of colon 01/31/2015  . AAA (abdominal aortic aneurysm) ([place]) 01/01/2015  . Sinusitis 10/10/2014  . Hip pain, bilateral 07/05/2014  . Palpitations 02/27/2014  . Claudication of lower extremity ([place]) 10/25/2013  . BPH with obstruction/lower urinary tract symptoms 10/25/2013  . Hypertriglyceridemia 08/15/2012  . Spinal stenosis of lumbar region at multiple levels 08/10/2012  . Melanoma ([place])   . Dizziness   . Renal insufficiency   . CAD (coronary artery disease)   . Hyperlipidemia   . HTN (hypertension)   . OSA (obstructive sleep apnea)   . Ejection fraction   . SOB (shortness of breath)   . PARESTHESIA 05/30/2009  . Overweight 01/22/2009  . EDEMA 01/22/2009  . OTHER ABNORMAL BLOOD CHEMISTRY 04/25/2008  . Gout 10/21/2007  . DEPRESSION 10/21/2007  . VENTRAL HERNIA 10/21/2007  . HIATAL HERNIA 10/21/2007  . FATIGUE 10/21/2007  . SKIN CANCER, HX OF 10/21/2007  . NEPHROLITHIASIS, HX OF 10/21/2007    Past Surgical History:  Procedure Laterality Date  . BACK SURGERY    . CARDIAC  CATHETERIZATION    . CERVICAL DISC SURGERY  90's  . CORONARY ANGIOPLASTY WITH STENT PLACEMENT  07/26/2013   DES to RCA extending to PDA    . CORONARY STENT PLACEMENT  2000   CAD  . KNEE ARTHROSCOPY  90's   rt  . LEFT AND RIGHT HEART CATHETERIZATION WITH CORONARY ANGIOGRAM N/A 07/26/2013   Procedure: LEFT AND RIGHT HEART CATHETERIZATION WITH CORONARY ANGIOGRAM;  Surgeon: [name], MD;  Location: [facility] CATH LAB;  Service: Cardiovascular;  Laterality: N/A;  . LEFT HEART CATHETERIZATION WITH CORONARY ANGIOGRAM N/A 05/19/2013   Procedure: LEFT HEART CATHETERIZATION WITH CORONARY ANGIOGRAM;  Surgeon: [name], MD;  Location: [facility] CATH LAB;  Service:  Cardiovascular;  Laterality: N/A;  . LUMBAR LAMINECTOMY/DECOMPRESSION MICRODISCECTOMY  08/30/2012   Procedure: LUMBAR LAMINECTOMY/DECOMPRESSION MICRODISCECTOMY 2 LEVELS;  Surgeon: [name], MD;  Location: [facility] NEURO ORS;  Service: Neurosurgery;  Laterality: Bilateral;  Bilateral Lumbar three-four Lumbar four-five Laminotomies  . NOSE SURGERY    . PERCUTANEOUS CORONARY STENT INTERVENTION (PCI-S)  05/19/2013   Procedure: PERCUTANEOUS CORONARY STENT INTERVENTION (PCI-S);  Surgeon: [name], MD;  Location: [facility] CATH LAB;  Service: Cardiovascular;;       Home Medications    Prior to Admission medications   Medication Sig Start Date End Date Taking? Authorizing Provider  acetaminophen (TYLENOL) 500 MG tablet Take 1,000 mg by mouth 2 (two) times daily.    Historical Provider, MD  albuterol (PROVENTIL HFA;VENTOLIN HFA) 108 (90 BASE) MCG/ACT inhaler Inhale 2 puffs into the lungs every 6 (six) hours as needed for wheezing or shortness of breath. 04/05/15   [name], MD  Albuterol Sulfate (PROAIR RESPICLICK) 536 (90 BASE) MCG/ACT AEPB Inhale 2 puffs into the lungs every 4 (four) hours as needed. 04/08/15   [name], MD  allopurinol (ZYLOPRIM) 300 MG tablet TAKE 1 TABLET EVERY DAY 02/25/16   [name], MD  aspirin EC 81 MG tablet Take 81 mg by mouth daily. 02/23/11   [name], MD  atorvastatin (LIPITOR) 40 MG tablet TAKE 1 TABLET EVERY DAY 10/08/15   [name], MD  BD INSULIN SYRINGE ULTRAFINE 31G X 5/16" 1 ML MISC USE  TO INJECT  INSULIN  FIVE  TIMES  DAILY AS INSTRUCTED 01/27/16   [name], MD  Blood Glucose Monitoring Suppl (BLOOD GLUCOSE METER) kit Use as instructed 12/20/13   [name], MD  Blood Glucose Monitoring Suppl (TRUE METRIX AIR GLUCOSE METER) DEVI 1 each by Does not apply route daily. 04/02/15   [name], MD  Cholecalciferol (VITAMIN D) 1000 UNITS capsule Take 1,000 Units by mouth at bedtime.     Historical Provider, MD  cyclobenzaprine  (FLEXERIL) 10 MG tablet Take 1 tablet (10 mg total) by mouth at bedtime. 03/05/16   Percell Miller Saguier, PA-C  fenofibrate 160 MG tablet TAKE 1 TABLET EVERY DAY 12/02/15   [name], MD  fluocinonide cream (LIDEX) 4.68 % Apply 1 application topically daily as needed. FOR RASH 08/11/12   Historical Provider, MD  fluticasone (FLONASE) 50 MCG/ACT nasal spray Place 2 sprays into both nostrils daily. Patient taking differently: Place 2 sprays into both nostrils as needed.  10/10/14   Percell Miller Saguier, PA-C  furosemide (LASIX) 80 MG tablet TAKE 1 TABLET EVERY DAY 12/02/15   [name], MD  glucose blood (TRUE METRIX BLOOD GLUCOSE TEST) test strip Use to test blood sugar 4 times daily as instructed. Dx: E11.9 03/21/15   [name], MD  glucose blood (TRUETEST TEST) test strip Use to test blood sugar 3 times daily as instructed. Dx code: E11.9 and Lancets 3/day 04/02/15   [name], MD  HYDROcodone-acetaminophen (NORCO) 5-325 MG tablet Take 1 tablet by mouth every 6 (six) hours as needed for moderate pain. 03/05/16   Percell Miller Saguier, PA-C  insulin NPH Human (NOVOLIN N) 100 UNIT/ML injection Inject 40-45 units 2x a day 11/28/15   [name], MD  Insulin Pen Needle (PEN NEEDLES 31GX5/16") 31G X 8 MM MISC Use to inject insulin 2 times daily as instructed. 12/24/14   [name], MD  insulin regular (NOVOLIN R RELION) 100 units/mL injection Inject 0.25-0.3 mLs (25-30 Units total) into the skin 3 (three) times daily before meals. 11/28/15   [name], MD  isosorbide mononitrate (IMDUR) 30 MG 24 hr tablet TAKE 1 TABLET EVERY DAY 08/01/15   [name], MD  metoprolol (LOPRESSOR) 50 MG tablet TAKE 1 TABLET TWICE DAILY 12/02/15   [name], MD  nitroGLYCERIN (NITROSTAT) 0.4 MG SL tablet Place 1 tablet (0.4 mg total) under the tongue every 5 (five) minutes as needed for chest pain. 07/05/14   [name], MD  Omega-3 Fatty Acids (FISH OIL PO) Take by mouth daily.    Historical Provider,  MD  oxybutynin (DITROPAN) 5 MG tablet Take 1 tablet by mouth 2 (two) times daily. 10/24/14   Historical Provider, MD  pantoprazole (PROTONIX) 20 MG tablet Take 1 tablet (20 mg total) by mouth daily. 03/05/16   [name], MD  tamsulosin (FLOMAX) 0.4 MG CAPS capsule TAKE 1 CAPSULE EVERY DAY 03/18/16   [name], MD  TRUEPLUS LANCETS 28G MISC Use to test blood glucose 4 times daily as instructed. Dx code: E11.9 03/21/15   [name], MD    Family History Family History  Problem Relation Age of Onset  . Prostate cancer    . Kidney cancer    . Cancer      Bladder cancer  . Coronary artery disease    . Cancer Mother     intestinal   . Heart disease Mother   . Cancer Father     prostate  . Migraines Daughter   . Leukemia Sister   . Heart disease Sister   . Heart attack Neg Hx   . Stroke Neg Hx   . Hypertension Sister   . Hypertension Brother     Social History Social History  Substance Use Topics  . Smoking status: Former Smoker    Packs/day: 3.00    Years: 30.00    Types: Cigarettes    Quit date: 09/17/1996  . Smokeless tobacco: Never Used  . Alcohol use No     Allergies   Levemir [insulin detemir]; Codeine; Lipitor [atorvastatin]; and Novolog mix [insulin aspart prot & aspart]   Review of Systems Review of Systems  All other systems reviewed and are negative.    Physical Exam Updated Vital Signs There were no vitals taken for this visit.  Physical Exam  Constitutional: He is oriented to person, place, and time. He appears well-developed and well-nourished. No distress.  HENT:  Head: Normocephalic and atraumatic.  Mouth/Throat: No oropharyngeal exudate.  Neck: Normal range of motion. Neck supple.  Cardiovascular: Normal rate and regular rhythm.   No murmur heard. Pulmonary/Chest: Effort normal and breath sounds normal. No respiratory distress. He has no wheezes.  Abdominal: Soft. He exhibits no distension. There is no tenderness.  Musculoskeletal:  Normal range of motion. He  exhibits no edema or tenderness.  Neurological: He is alert and oriented to person, place, and time. No cranial nerve deficit.  Skin: He is not diaphoretic.  Nursing note and vitals reviewed.    ED Treatments / Results  Labs (all labs ordered are listed, but only abnormal results are displayed) Labs Reviewed - No data to display  EKG  EKG Interpretation None       Radiology No results found.  Procedures Procedures (including critical care time)  Medications Ordered in ED Medications - No data to display   Initial Impression / Assessment and Plan / ED Course  I have reviewed the triage vital signs and the nursing notes.  Pertinent labs & imaging results that were available during my care of the patient were reviewed by me and considered in my medical decision making (see chart for details).  Clinical Course    Patient with history of insulin-dependent diabetes who presents with low blood sugar. His physical examination is unremarkable. His blood sugars have been remaining stable after he consumed orange juice at home. His sugars have been running somewhat lower than normal. I have advised him to decrease the dose of his insulin that he takes at night and keep a record of his blood sugars. He has an appointment upcoming with his endocrinologist and I have advised him to keep this. He is on no long-acting oral hypoglycemic agents/sulfonylureas.  Final Clinical Impressions(s) / ED Diagnoses   Final diagnoses:  Hypoglycemia    New Prescriptions New Prescriptions   No medications on file     Veryl Speak, MD 05/03/16 [phone]

## 2016-05-03 NOTE — ED Notes (Signed)
Pt d/c'd at 0415. See paper chart documentation during downtime

## 2016-05-04 LAB — CBG MONITORING, ED
Glucose-Capillary: 89 mg/dL (ref 65–99)
Glucose-Capillary: 90 mg/dL (ref 65–99)

## 2016-05-05 ENCOUNTER — Other Ambulatory Visit: Payer: Self-pay | Admitting: Internal Medicine

## 2016-05-05 ENCOUNTER — Other Ambulatory Visit: Payer: Self-pay | Admitting: Family Medicine

## 2016-05-09 DIAGNOSIS — E10649 Type 1 diabetes mellitus with hypoglycemia without coma: Secondary | ICD-10-CM | POA: Diagnosis not present

## 2016-05-13 ENCOUNTER — Telehealth: Payer: Self-pay | Admitting: Family Medicine

## 2016-05-13 NOTE — Telephone Encounter (Signed)
Noted. Pt was given 30 day supply by PCP because he is overdue for follow-up and he is on medication that requires regular blood work and monitoring (has not seen PCP for routine visit in over 1 year). 90 day supply may be granted by PCP once pt comes in for appt.

## 2016-05-13 NOTE — Telephone Encounter (Signed)
Pt called in because she says that they received a 30 day supply of atorvastatin. Pt says that they receive medication free if a 90 day supply. Pt would like to have 90 day instead going forward.   Pt has been scheduled for 8/4 at 10:00a

## 2016-05-15 ENCOUNTER — Telehealth: Payer: Self-pay | Admitting: *Deleted

## 2016-05-15 ENCOUNTER — Encounter: Payer: Self-pay | Admitting: Family Medicine

## 2016-05-15 ENCOUNTER — Ambulatory Visit (INDEPENDENT_AMBULATORY_CARE_PROVIDER_SITE_OTHER): Payer: Commercial Managed Care - HMO | Admitting: Family Medicine

## 2016-05-15 VITALS — BP 124/72 | HR 63 | Temp 98.0°F | Resp 16 | Ht 71.0 in | Wt 263.2 lb

## 2016-05-15 DIAGNOSIS — M25552 Pain in left hip: Secondary | ICD-10-CM

## 2016-05-15 DIAGNOSIS — Z794 Long term (current) use of insulin: Secondary | ICD-10-CM | POA: Diagnosis not present

## 2016-05-15 DIAGNOSIS — M25551 Pain in right hip: Secondary | ICD-10-CM

## 2016-05-15 DIAGNOSIS — I10 Essential (primary) hypertension: Secondary | ICD-10-CM | POA: Diagnosis not present

## 2016-05-15 DIAGNOSIS — E1165 Type 2 diabetes mellitus with hyperglycemia: Secondary | ICD-10-CM

## 2016-05-15 DIAGNOSIS — N289 Disorder of kidney and ureter, unspecified: Secondary | ICD-10-CM

## 2016-05-15 DIAGNOSIS — E1159 Type 2 diabetes mellitus with other circulatory complications: Secondary | ICD-10-CM | POA: Diagnosis not present

## 2016-05-15 DIAGNOSIS — E785 Hyperlipidemia, unspecified: Secondary | ICD-10-CM

## 2016-05-15 DIAGNOSIS — R002 Palpitations: Secondary | ICD-10-CM | POA: Diagnosis not present

## 2016-05-15 DIAGNOSIS — M1000 Idiopathic gout, unspecified site: Secondary | ICD-10-CM

## 2016-05-15 DIAGNOSIS — M5416 Radiculopathy, lumbar region: Secondary | ICD-10-CM

## 2016-05-15 DIAGNOSIS — IMO0002 Reserved for concepts with insufficient information to code with codable children: Secondary | ICD-10-CM

## 2016-05-15 DIAGNOSIS — E663 Overweight: Secondary | ICD-10-CM | POA: Diagnosis not present

## 2016-05-15 LAB — LIPID PANEL
Cholesterol: 133 mg/dL (ref 0–200)
HDL: 26.8 mg/dL — ABNORMAL LOW (ref >39.00)
NONHDL: 106.65
Total CHOL/HDL Ratio: 5
Triglycerides: 214 mg/dL — ABNORMAL HIGH (ref 0.0–149.0)
VLDL: 42.8 mg/dL — ABNORMAL HIGH (ref 0.0–40.0)

## 2016-05-15 LAB — CBC
HEMATOCRIT: 40.2 % (ref 39.0–52.0)
HEMOGLOBIN: 13.6 g/dL (ref 13.0–17.0)
MCHC: 33.8 g/dL (ref 30.0–36.0)
MCV: 91.8 fl (ref 78.0–100.0)
PLATELETS: 226 10*3/uL (ref 150.0–400.0)
RBC: 4.38 Mil/uL (ref 4.22–5.81)
RDW: 14.1 % (ref 11.5–15.5)
WBC: 6.6 10*3/uL (ref 4.0–10.5)

## 2016-05-15 LAB — LDL CHOLESTEROL, DIRECT: Direct LDL: 73 mg/dL

## 2016-05-15 LAB — COMPREHENSIVE METABOLIC PANEL
ALBUMIN: 4.3 g/dL (ref 3.5–5.2)
ALK PHOS: 47 U/L (ref 39–117)
ALT: 21 U/L (ref 0–53)
AST: 21 U/L (ref 0–37)
BILIRUBIN TOTAL: 0.7 mg/dL (ref 0.2–1.2)
BUN: 25 mg/dL — AB (ref 6–23)
CO2: 30 mEq/L (ref 19–32)
CREATININE: 1.49 mg/dL (ref 0.40–1.50)
Calcium: 10.3 mg/dL (ref 8.4–10.5)
Chloride: 102 mEq/L (ref 96–112)
GFR: 48.71 mL/min — ABNORMAL LOW (ref >60.00)
GLUCOSE: 166 mg/dL — AB (ref 70–99)
POTASSIUM: 3.6 meq/L (ref 3.5–5.1)
SODIUM: 139 meq/L (ref 135–145)
TOTAL PROTEIN: 7.4 g/dL (ref 6.0–8.3)

## 2016-05-15 LAB — TSH: TSH: 3.19 u[IU]/mL (ref 0.35–4.50)

## 2016-05-15 MED ORDER — CYCLOBENZAPRINE HCL 5 MG PO TABS: 5.0000 mg | ORAL_TABLET | Freq: Three times a day (TID) | ORAL | 1 refills | Status: DC | PRN

## 2016-05-15 MED ORDER — ATORVASTATIN CALCIUM 40 MG PO TABS: 40.0000 mg | ORAL_TABLET | Freq: Every day | ORAL | 0 refills | Status: DC

## 2016-05-15 NOTE — Assessment & Plan Note (Signed)
Encouraged heart healthy diet, increase exercise, avoid trans fats, consider a krill oil cap daily 

## 2016-05-15 NOTE — Assessment & Plan Note (Signed)
Well controlled, no changes to meds. Encouraged heart healthy diet such as the DASH diet and exercise as tolerated.  °

## 2016-05-15 NOTE — Patient Instructions (Addendum)
Lidocaine patches at bed  Basic Carbohydrate Counting for Diabetes Mellitus Carbohydrate counting is a method for keeping track of the amount of carbohydrates you eat. Eating carbohydrates naturally increases the level of sugar (glucose) in your blood, so it is important for you to know the amount that is okay for you to have in every meal. Carbohydrate counting helps keep the level of glucose in your blood within normal limits. The amount of carbohydrates allowed is different for every person. A dietitian can help you calculate the amount that is right for you. Once you know the amount of carbohydrates you can have, you can count the carbohydrates in the foods you want to eat. Carbohydrates are found in the following foods:  Grains, such as breads and cereals.  Dried beans and soy products.  Starchy vegetables, such as potatoes, peas, and corn.  Fruit and fruit juices.  Milk and yogurt.  Sweets and snack foods, such as cake, cookies, candy, chips, soft drinks, and fruit drinks. CARBOHYDRATE COUNTING There are two ways to count the carbohydrates in your food. You can use either of the methods or a combination of both. Reading the "Nutrition Facts" on [place] The "Nutrition Facts" is an area that is included on the labels of almost all packaged food and beverages in the [place]. It includes the serving size of that food or beverage and information about the nutrients in each serving of the food, including the grams (g) of carbohydrate per serving.  Decide the number of servings of this food or beverage that you will be able to eat or drink. Multiply that number of servings by the number of grams of carbohydrate that is listed on the label for that serving. The total will be the amount of carbohydrates you will be having when you eat or drink this food or beverage. Learning Standard Serving Sizes of Food When you eat food that is not packaged or does not include "Nutrition Facts" on  the label, you need to measure the servings in order to count the amount of carbohydrates.A serving of most carbohydrate-rich foods contains about 15 g of carbohydrates. The following list includes serving sizes of carbohydrate-rich foods that provide 15 g ofcarbohydrate per serving:   1 slice of bread (1 oz) or 1 six-inch tortilla.    of a hamburger bun or English muffin.  4-6 crackers.   cup unsweetened dry cereal.    cup hot cereal.   cup rice or pasta.    cup mashed potatoes or  of a large baked potato.  1 cup fresh fruit or one small piece of fruit.    cup canned or frozen fruit or fruit juice.  1 cup milk.   cup plain fat-free yogurt or yogurt sweetened with artificial sweeteners.   cup cooked dried beans or starchy vegetable, such as peas, corn, or potatoes.  Decide the number of standard-size servings that you will eat. Multiply that number of servings by 15 (the grams of carbohydrates in that serving). For example, if you eat 2 cups of strawberries, you will have eaten 2 servings and 30 g of carbohydrates (2 servings x 15 g = 30 g). For foods such as soups and casseroles, in which more than one food is mixed in, you will need to count the carbohydrates in each food that is included. EXAMPLE OF CARBOHYDRATE COUNTING Sample Dinner  3 oz chicken breast.   cup of brown rice.   cup of corn.  1 cup milk.  1 cup strawberries with sugar-free whipped topping.  Carbohydrate Calculation Step 1: Identify the foods that contain carbohydrates:   Rice.   Corn.   Milk.   Strawberries. Step 2:Calculate the number of servings eaten of each:   2 servings of rice.   1 serving of corn.   1 serving of milk.   1 serving of strawberries. Step 3: Multiply each of those number of servings by 15 g:   2 servings of rice x 15 g = 30 g.   1 serving of corn x 15 g = 15 g.   1 serving of milk x 15 g = 15 g.   1 serving of strawberries x 15 g =  15 g. Step 4: Add together all of the amounts to find the total grams of carbohydrates eaten: 30 g + 15 g + 15 g + 15 g = 75 g.   This information is not intended to replace advice given to you by your health care provider. Make sure you discuss any questions you have with your health care provider.   Document Released: 09/28/2005 Document Revised: 10/19/2014 Document Reviewed: 08/25/2013 Elsevier Interactive Patient Education [employer].

## 2016-05-15 NOTE — Telephone Encounter (Signed)
Spoke with pharmacy at [facility] regarding cancellation needed for Cyclobenzaprine, as patient requested Rx be sent to local phamracy; Rx canceled per pharmacy rep/SLS 08/04

## 2016-05-15 NOTE — Assessment & Plan Note (Signed)
L>R hip pain,

## 2016-05-20 DIAGNOSIS — M5126 Other intervertebral disc displacement, lumbar region: Secondary | ICD-10-CM | POA: Diagnosis not present

## 2016-05-25 DIAGNOSIS — R030 Elevated blood-pressure reading, without diagnosis of hypertension: Secondary | ICD-10-CM | POA: Diagnosis not present

## 2016-05-26 ENCOUNTER — Telehealth: Payer: Self-pay | Admitting: Family Medicine

## 2016-05-26 DIAGNOSIS — G5622 Lesion of ulnar nerve, left upper limb: Secondary | ICD-10-CM | POA: Diagnosis not present

## 2016-05-26 DIAGNOSIS — I10 Essential (primary) hypertension: Secondary | ICD-10-CM | POA: Diagnosis not present

## 2016-05-26 DIAGNOSIS — G5602 Carpal tunnel syndrome, left upper limb: Secondary | ICD-10-CM | POA: Diagnosis not present

## 2016-05-26 DIAGNOSIS — Z6837 Body mass index (BMI) 37.0-37.9, adult: Secondary | ICD-10-CM | POA: Diagnosis not present

## 2016-05-26 MED ORDER — PANTOPRAZOLE SODIUM 20 MG PO TBEC: 20.0000 mg | DELAYED_RELEASE_TABLET | Freq: Every day | ORAL | 3 refills | Status: DC

## 2016-05-26 NOTE — Telephone Encounter (Signed)
Refill done as requested to [facility].

## 2016-05-26 NOTE — Telephone Encounter (Signed)
Pt's spouse is requesting a refill on pt's Rx for pantoprazole . She says that it is a 90 day with 1 refill.    Pharmacy: [facility] - [place], [place]

## 2016-05-28 ENCOUNTER — Ambulatory Visit (INDEPENDENT_AMBULATORY_CARE_PROVIDER_SITE_OTHER): Payer: Commercial Managed Care - HMO | Admitting: Internal Medicine

## 2016-05-28 ENCOUNTER — Encounter: Payer: Self-pay | Admitting: Internal Medicine

## 2016-05-28 ENCOUNTER — Other Ambulatory Visit: Payer: Self-pay

## 2016-05-28 VITALS — BP 148/80 | HR 74 | Ht 69.0 in | Wt 263.0 lb

## 2016-05-28 DIAGNOSIS — E1159 Type 2 diabetes mellitus with other circulatory complications: Secondary | ICD-10-CM | POA: Diagnosis not present

## 2016-05-28 DIAGNOSIS — E1165 Type 2 diabetes mellitus with hyperglycemia: Secondary | ICD-10-CM

## 2016-05-28 DIAGNOSIS — Z794 Long term (current) use of insulin: Secondary | ICD-10-CM

## 2016-05-28 DIAGNOSIS — IMO0002 Reserved for concepts with insufficient information to code with codable children: Secondary | ICD-10-CM

## 2016-05-28 DIAGNOSIS — M5126 Other intervertebral disc displacement, lumbar region: Secondary | ICD-10-CM | POA: Diagnosis not present

## 2016-05-28 LAB — POCT GLYCOSYLATED HEMOGLOBIN (HGB A1C): HEMOGLOBIN A1C: 7

## 2016-05-28 MED ORDER — INSULIN NPH (HUMAN) (ISOPHANE) 100 UNIT/ML ~~LOC~~ SUSP: SUBCUTANEOUS | 1 refills | Status: DC

## 2016-05-28 MED ORDER — "PEN NEEDLES 5/16"" 31G X 8 MM MISC": 2 refills | Status: DC

## 2016-05-28 MED ORDER — GLUCOSE BLOOD VI STRP
ORAL_STRIP | 3 refills | Status: DC
Start: 2016-05-28 — End: 2016-07-30

## 2016-05-28 NOTE — Addendum Note (Signed)
Addended by: Caprice Beaver T on: 05/28/2016 11:37 AM   Modules accepted: Orders

## 2016-05-28 NOTE — Patient Instructions (Addendum)
Patient Instructions  Please continue ReliOn insulin doses: Insulin Before breakfast Before lunch Before dinner  Regular 25 25 25   NPH 40  35  Please check some sugars at bedtime, also.  Please let me know if you have low sugars again.  Please come back for a follow-up appointment in 3 months.

## 2016-05-28 NOTE — Progress Notes (Signed)
Patient ID: William Hendrix, male   DOB: [birth year], 75 y.o.   MRN: [id number]  HPI: William Hendrix is a 75 y.o.-year-old male, returning for f/u for DM2, dx 2009, insulin-dependent, uncontrolled, with complications (CAD, CKD stage 3). Last visit 3 mo ago.  He had back surgery in 03/2016. He then fell >> may need reoperation.  He had low CBGs >> 42 on 05/02/2016 >> went to hospital >> insulin doses decreased.   Last hemoglobin A1c was: Lab Results  Component Value Date   HGBA1C 7.8 02/26/2016   HGBA1C 7.9 11/28/2015   HGBA1C 7.8 (H) 07/04/2015   He is now on: ReliOn insulin: Insulin Before breakfast Before lunch Before dinner  Regular 30 >> 25 25 30  >> 25  NPH 45 >> 40  40 >> 35   We cannot use Metformin 2/2 CKD. We stopped Januvia at last visit >> could not afford ($100/3 mo)  We cannot use Levemir >> developed a severe skin reaction to Levemir  We stopped Amaryl 4 mg.  He brings his log. He checks 3 times a day - brings a good log: - am: 169-228 >> 151-192 >> 145-176  >> 200-280 >> 152-220 >> 95-162 >> 120-169 >> 114, 147-178 >> 90-146 - Before lunch: 137-220 >> 85-147 >> 219-292 >> 189-260 >> 77, 82-167 >> 95-149, 183 >> 146-179 >> 123, 130-148 - before dinner: 65, 71, 109-148, 168 >> 73, 85-165, 207-247 if skips lunch >> 109-173, 191, 225 >> 66, 81-151 - Before bedtime, highest sugars at that day, 175-240 >> 157-179 >> 102 >> n/c No lows. Lowest sugar was 150 >> 65 >> 73 >> 102 >> 42 x1, 60,  he does not know whether he has hypoglycemia awareness. Highest sugar was 300s >> 180 >> 247 >> 225 >> 220 (cake) >> 171.  Pt has chronic kidney disease, last BUN/creatinine was:  Lab Results  Component Value Date   BUN 25 (H) 05/15/2016   CREATININE 1.49 05/15/2016  ACR 0.7. Last lipids: Lab Results  Component Value Date   CHOL 133 05/15/2016   HDL 26.80 (L) 05/15/2016   LDLCALC 31 04/09/2014   LDLDIRECT 73.0 05/15/2016   TRIG 214.0 (H) 05/15/2016   CHOLHDL 5 05/15/2016    Pt's last eye exam was 09/30/2015. No DR. Had cataract sx 6 years ago.  Denies numbness and tingling in his legs.  His daughter has acute pancreatitis >> she has a tight sphincter of Oddi - has been admitted 8x for it  PMH: Pt also also has a history of CAD, sees Dr. Ron Parker (but will change Dr's soon as he retires), hypertension, hyperlipidemia, chronic kidney disease stage III, history of nephrolithiasis, obesity, hiatal hernia, obstructive sleep apnea, hyperglyceridemia, melanoma, spinal stenosis.  ROS: Constitutional: no  weight gain, no fatigue, no subjective hyperthermia/hypothermia Eyes: no blurry vision, no xerophthalmia ENT: no sore throat, no nodules palpated in throat, no dysphagia/odynophagia, no hoarseness Cardiovascular: no CP/SOB/no palpitations/+ leg swelling Respiratory: no cough/SOB Gastrointestinal: no N/V/D/C Musculoskeletal: no muscle/joint aches Skin: no rashes  I reviewed pt's medications, allergies, PMH, social hx, family hx, and changes were documented in the history of present illness. Otherwise, unchanged from my initial visit note.  PE: BP (!) 148/80 (BP Location: Left Arm, Patient Position: Sitting)   Pulse 74   Ht 5\' 9"  (1.753 m)   Wt 263 lb (119.3 kg)   BMI 38.84 kg/m  Body mass index is 38.84 kg/m. Wt Readings from Last 3 Encounters:  05/28/16 263 lb (119.3  kg)  05/15/16 263 lb 4 oz (119.4 kg)  03/05/16 267 lb 6.4 oz (121.3 kg)  Constitutional: overweight, in NAD Eyes: PERRLA, EOMI, no exophthalmos ENT: moist mucous membranes, no thyromegaly, no cervical lymphadenopathy Cardiovascular: RRR, No MRG, + leg swelling B Respiratory: CTA B Gastrointestinal: abdomen soft, NT, ND, BS+ Musculoskeletal: no deformities, strength intact in all 4 Skin: moist, warm, no rashes  ASSESSMENT: 1. DM2, insulin-dependent, uncontrolled, with complications - CAD, s/p stents - Dr. Ron Parker. He had stenting of distal RCA/PDA in 07/2013. Prior stenting of LAD in  04/2013. - AAA - CKD stage 3  PLAN:  1. Patient with improving diabetes control after switching to basal-bolus insulin regimen, with much better sugars after his surgery. He had a low CBG >> went to the ED >> insulin doses reduced. He had 1x 60 since then, the rest of the sugars are at or close to goal. Will continue the same doses. - I advised him to: Patient Instructions  Please continue ReliOn insulin doses: Insulin Before breakfast Before lunch Before dinner  Regular 25 25 25   NPH 40  35  Please check some sugars at bedtime, also.  Please let me know if you have low sugars again.  Please come back for a follow-up appointment in 3 months.  - He is up-to-date with flu shot - UTD with eye exams - HbA1c today: 7.0% (improved!) - I will see the patient back in 3 mo with his sugar log  [name], MD PhD [facility] Endocrinology

## 2016-05-31 NOTE — Assessment & Plan Note (Signed)
[place] in June aninjured his back he has had surgery and is improving. Will continue to follow with surgeon. Encouraged to consider topical treatments prn

## 2016-05-31 NOTE — Assessment & Plan Note (Signed)
Has an appointment with endocrinology in 2 weeks. Minimize simple carbs, continue insulin bid and eat small frequent meals with lean proteins and minimal simple carbs.
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
## 2016-05-31 NOTE — Assessment & Plan Note (Signed)
Encouraged DASH diet, decrease po intake and increase exercise as tolerated. Needs 7-8 hours of sleep nightly. Avoid trans fats, eat small, frequent meals every 4-5 hours with lean proteins, complex carbs and healthy fats. Minimize simple carbs 

## 2016-06-04 ENCOUNTER — Other Ambulatory Visit: Payer: Self-pay

## 2016-06-04 ENCOUNTER — Telehealth: Payer: Self-pay | Admitting: Internal Medicine

## 2016-06-04 MED ORDER — "BD INSULIN SYRINGE ULTRAFINE 31G X 5/16"" 1 ML MISC": 3 refills | Status: DC

## 2016-06-04 NOTE — Telephone Encounter (Signed)
Called and notified patient we were sending in the correct syringes for the patient, advised her to call [employer] and get a sticker to send those back so she is not billed. Patient stated she would.

## 2016-06-04 NOTE — Telephone Encounter (Signed)
PT wife called and said that he does not need the Pen Needles he needs the Syringes called into the [facility].  She also requests that you let the pharmacy know that he does not need the pen needles so they can reverse the charge. Requests call back.

## 2016-06-12 DIAGNOSIS — M5416 Radiculopathy, lumbar region: Secondary | ICD-10-CM | POA: Diagnosis not present

## 2016-06-12 DIAGNOSIS — M5116 Intervertebral disc disorders with radiculopathy, lumbar region: Secondary | ICD-10-CM | POA: Diagnosis not present

## 2016-06-24 DIAGNOSIS — G5622 Lesion of ulnar nerve, left upper limb: Secondary | ICD-10-CM | POA: Diagnosis not present

## 2016-06-24 DIAGNOSIS — G5602 Carpal tunnel syndrome, left upper limb: Secondary | ICD-10-CM | POA: Diagnosis not present

## 2016-06-29 DIAGNOSIS — G5602 Carpal tunnel syndrome, left upper limb: Secondary | ICD-10-CM | POA: Diagnosis not present

## 2016-06-29 DIAGNOSIS — G5622 Lesion of ulnar nerve, left upper limb: Secondary | ICD-10-CM | POA: Diagnosis not present

## 2016-07-15 ENCOUNTER — Telehealth: Payer: Self-pay | Admitting: Family Medicine

## 2016-07-15 NOTE — Telephone Encounter (Signed)
Caller name: Jacori, Nagarajan Relation to [phone]  Call back [phone]   Reason for call:  Spouse requesting a referral to [place] neuro[facility] and spin [facility] due to follow up appointments

## 2016-07-16 ENCOUNTER — Other Ambulatory Visit: Payer: Self-pay | Admitting: Family Medicine

## 2016-07-16 DIAGNOSIS — M545 Low back pain: Secondary | ICD-10-CM

## 2016-07-16 NOTE — Telephone Encounter (Signed)
preop is best within 30 to 60 day of surgery. If he wants to discuss he should come in. If he does not have a date it could easily expire before they set a date. So please clarify what he wants

## 2016-07-16 NOTE — Telephone Encounter (Signed)
Called the patient informed of PCP instructions. Patient states surgery may not be until the first of the year, once he has a date will call back to schedule a surgical clearance appt. With PCP.

## 2016-07-16 NOTE — Telephone Encounter (Signed)
To clarify request.  He did not need a referral/spin gso a misunderstanding of message. He need surgical clearance ok as neurosurgeon states he made need further back surgery and previous ok clearance is has almost expired. Last office visit with PCP 05/15/2016 Last EKG at Cardiologist 12/25/2015

## 2016-07-16 NOTE — Telephone Encounter (Signed)
Not sure what spin [facility] is, have placed the nuerosurg consult please clarify with patient what the other is.

## 2016-07-30 ENCOUNTER — Emergency Department (HOSPITAL_COMMUNITY): Payer: Commercial Managed Care - HMO

## 2016-07-30 ENCOUNTER — Encounter (HOSPITAL_COMMUNITY)
Admission: EM | Disposition: A | Discharge status: Home / Self Care | Payer: Self-pay | Source: Home / Self Care | Attending: Emergency Medicine

## 2016-07-30 ENCOUNTER — Other Ambulatory Visit: Payer: Self-pay

## 2016-07-30 ENCOUNTER — Encounter (HOSPITAL_COMMUNITY): Payer: Self-pay | Admitting: Emergency Medicine

## 2016-07-30 ENCOUNTER — Observation Stay (HOSPITAL_BASED_OUTPATIENT_CLINIC_OR_DEPARTMENT_OTHER)
Admission: EM | Admit: 2016-07-30 | Discharge: 2016-07-31 | Disposition: A | Discharge status: Home / Self Care | Payer: Commercial Managed Care - HMO | Source: Home / Self Care | Attending: Emergency Medicine | Admitting: Emergency Medicine

## 2016-07-30 DIAGNOSIS — G4733 Obstructive sleep apnea (adult) (pediatric): Secondary | ICD-10-CM

## 2016-07-30 DIAGNOSIS — Z7982 Long term (current) use of aspirin: Secondary | ICD-10-CM | POA: Diagnosis not present

## 2016-07-30 DIAGNOSIS — R079 Chest pain, unspecified: Secondary | ICD-10-CM | POA: Diagnosis present

## 2016-07-30 DIAGNOSIS — M1990 Unspecified osteoarthritis, unspecified site: Secondary | ICD-10-CM

## 2016-07-30 DIAGNOSIS — E1122 Type 2 diabetes mellitus with diabetic chronic kidney disease: Secondary | ICD-10-CM | POA: Diagnosis present

## 2016-07-30 DIAGNOSIS — Z7902 Long term (current) use of antithrombotics/antiplatelets: Secondary | ICD-10-CM | POA: Diagnosis not present

## 2016-07-30 DIAGNOSIS — K219 Gastro-esophageal reflux disease without esophagitis: Secondary | ICD-10-CM

## 2016-07-30 DIAGNOSIS — Z85820 Personal history of malignant melanoma of skin: Secondary | ICD-10-CM | POA: Insufficient documentation

## 2016-07-30 DIAGNOSIS — G8929 Other chronic pain: Secondary | ICD-10-CM | POA: Diagnosis present

## 2016-07-30 DIAGNOSIS — E781 Pure hyperglyceridemia: Secondary | ICD-10-CM | POA: Diagnosis present

## 2016-07-30 DIAGNOSIS — Z794 Long term (current) use of insulin: Secondary | ICD-10-CM

## 2016-07-30 DIAGNOSIS — Z955 Presence of coronary angioplasty implant and graft: Secondary | ICD-10-CM | POA: Diagnosis not present

## 2016-07-30 DIAGNOSIS — Z79899 Other long term (current) drug therapy: Secondary | ICD-10-CM

## 2016-07-30 DIAGNOSIS — M545 Low back pain: Secondary | ICD-10-CM | POA: Diagnosis present

## 2016-07-30 DIAGNOSIS — I25700 Atherosclerosis of coronary artery bypass graft(s), unspecified, with unstable angina pectoris: Secondary | ICD-10-CM

## 2016-07-30 DIAGNOSIS — N183 Chronic kidney disease, stage 3 unspecified: Secondary | ICD-10-CM

## 2016-07-30 DIAGNOSIS — I5032 Chronic diastolic (congestive) heart failure: Secondary | ICD-10-CM

## 2016-07-30 DIAGNOSIS — M109 Gout, unspecified: Secondary | ICD-10-CM | POA: Diagnosis present

## 2016-07-30 DIAGNOSIS — R05 Cough: Secondary | ICD-10-CM | POA: Diagnosis not present

## 2016-07-30 DIAGNOSIS — Z87891 Personal history of nicotine dependence: Secondary | ICD-10-CM

## 2016-07-30 DIAGNOSIS — Z23 Encounter for immunization: Secondary | ICD-10-CM | POA: Diagnosis not present

## 2016-07-30 DIAGNOSIS — I214 Non-ST elevation (NSTEMI) myocardial infarction: Secondary | ICD-10-CM | POA: Diagnosis present

## 2016-07-30 DIAGNOSIS — I2510 Atherosclerotic heart disease of native coronary artery without angina pectoris: Secondary | ICD-10-CM | POA: Insufficient documentation

## 2016-07-30 DIAGNOSIS — I200 Unstable angina: Secondary | ICD-10-CM | POA: Diagnosis not present

## 2016-07-30 DIAGNOSIS — E1165 Type 2 diabetes mellitus with hyperglycemia: Secondary | ICD-10-CM | POA: Diagnosis present

## 2016-07-30 DIAGNOSIS — B349 Viral infection, unspecified: Secondary | ICD-10-CM | POA: Diagnosis present

## 2016-07-30 DIAGNOSIS — I714 Abdominal aortic aneurysm, without rupture: Secondary | ICD-10-CM | POA: Diagnosis present

## 2016-07-30 DIAGNOSIS — I130 Hypertensive heart and chronic kidney disease with heart failure and stage 1 through stage 4 chronic kidney disease, or unspecified chronic kidney disease: Secondary | ICD-10-CM

## 2016-07-30 DIAGNOSIS — E669 Obesity, unspecified: Secondary | ICD-10-CM | POA: Insufficient documentation

## 2016-07-30 DIAGNOSIS — I10 Essential (primary) hypertension: Secondary | ICD-10-CM | POA: Diagnosis present

## 2016-07-30 DIAGNOSIS — R0602 Shortness of breath: Secondary | ICD-10-CM | POA: Diagnosis not present

## 2016-07-30 DIAGNOSIS — I25110 Atherosclerotic heart disease of native coronary artery with unstable angina pectoris: Secondary | ICD-10-CM | POA: Diagnosis not present

## 2016-07-30 DIAGNOSIS — N289 Disorder of kidney and ureter, unspecified: Secondary | ICD-10-CM | POA: Diagnosis present

## 2016-07-30 DIAGNOSIS — E1159 Type 2 diabetes mellitus with other circulatory complications: Secondary | ICD-10-CM | POA: Diagnosis present

## 2016-07-30 DIAGNOSIS — R071 Chest pain on breathing: Secondary | ICD-10-CM | POA: Diagnosis not present

## 2016-07-30 DIAGNOSIS — E785 Hyperlipidemia, unspecified: Secondary | ICD-10-CM | POA: Insufficient documentation

## 2016-07-30 DIAGNOSIS — R0609 Other forms of dyspnea: Secondary | ICD-10-CM | POA: Diagnosis not present

## 2016-07-30 DIAGNOSIS — I110 Hypertensive heart disease with heart failure: Secondary | ICD-10-CM | POA: Diagnosis not present

## 2016-07-30 DIAGNOSIS — N189 Chronic kidney disease, unspecified: Secondary | ICD-10-CM | POA: Diagnosis not present

## 2016-07-30 DIAGNOSIS — I509 Heart failure, unspecified: Secondary | ICD-10-CM | POA: Diagnosis not present

## 2016-07-30 DIAGNOSIS — E1142 Type 2 diabetes mellitus with diabetic polyneuropathy: Secondary | ICD-10-CM | POA: Diagnosis present

## 2016-07-30 HISTORY — DX: Other chronic pain: G89.29

## 2016-07-30 HISTORY — DX: Personal history of other medical treatment: Z92.89

## 2016-07-30 HISTORY — DX: Cervicalgia: M54.2

## 2016-07-30 HISTORY — DX: Dependence on other enabling machines and devices: Z99.89

## 2016-07-30 HISTORY — PX: CARDIAC CATHETERIZATION: SHX172

## 2016-07-30 HISTORY — DX: Type 2 diabetes mellitus with diabetic polyneuropathy: E11.42

## 2016-07-30 HISTORY — DX: Chronic kidney disease, stage 3 unspecified: N18.30

## 2016-07-30 HISTORY — DX: Chronic diastolic (congestive) heart failure: I50.32

## 2016-07-30 HISTORY — DX: Obstructive sleep apnea (adult) (pediatric): G47.33

## 2016-07-30 HISTORY — PX: CORONARY ANGIOPLASTY WITH STENT PLACEMENT: SHX49

## 2016-07-30 HISTORY — DX: Type 2 diabetes mellitus without complications: E11.9

## 2016-07-30 HISTORY — DX: Low back pain: M54.5

## 2016-07-30 HISTORY — DX: Malignant melanoma of left upper limb, including shoulder: C43.62

## 2016-07-30 HISTORY — DX: Unspecified chronic bronchitis: J42

## 2016-07-30 HISTORY — DX: Chronic kidney disease, stage 3 (moderate): N18.3

## 2016-07-30 HISTORY — DX: Low back pain, unspecified: M54.50

## 2016-07-30 LAB — BASIC METABOLIC PANEL
Anion gap: 9 (ref 5–15)
BUN: 28 mg/dL — AB (ref 6–20)
CALCIUM: 10 mg/dL (ref 8.9–10.3)
CHLORIDE: 104 mmol/L (ref 101–111)
CO2: 27 mmol/L (ref 22–32)
CREATININE: 1.37 mg/dL — AB (ref 0.61–1.24)
GFR calc Af Amer: 56 mL/min — ABNORMAL LOW (ref >60)
GFR calc non Af Amer: 49 mL/min — ABNORMAL LOW (ref >60)
Glucose, Bld: 164 mg/dL — ABNORMAL HIGH (ref 65–99)
Potassium: 3.9 mmol/L (ref 3.5–5.1)
Sodium: 140 mmol/L (ref 135–145)

## 2016-07-30 LAB — CBC
HCT: 43.2 % (ref 39.0–52.0)
Hemoglobin: 14.8 g/dL (ref 13.0–17.0)
MCH: 31.4 pg (ref 26.0–34.0)
MCHC: 34.3 g/dL (ref 30.0–36.0)
MCV: 91.5 fL (ref 78.0–100.0)
PLATELETS: 168 10*3/uL (ref 150–400)
RBC: 4.72 MIL/uL (ref 4.22–5.81)
RDW: 14 % (ref 11.5–15.5)
WBC: 8.5 10*3/uL (ref 4.0–10.5)

## 2016-07-30 LAB — TROPONIN I
Troponin I: 0.04 ng/mL (ref <0.03)
Troponin I: 0.08 ng/mL (ref <0.03)

## 2016-07-30 LAB — POCT ACTIVATED CLOTTING TIME
ACTIVATED CLOTTING TIME: 334 s
Activated Clotting Time: 230 seconds
Activated Clotting Time: 285 seconds
Activated Clotting Time: 324 seconds

## 2016-07-30 LAB — GLUCOSE, CAPILLARY
Glucose-Capillary: 71 mg/dL (ref 65–99)
Glucose-Capillary: 170 mg/dL — ABNORMAL HIGH (ref 65–99)

## 2016-07-30 LAB — I-STAT TROPONIN, ED: Troponin i, poc: 0.01 ng/mL (ref 0.00–0.08)

## 2016-07-30 SURGERY — LEFT HEART CATH AND CORONARY ANGIOGRAPHY
Anesthesia: LOCAL

## 2016-07-30 MED ORDER — LIDOCAINE HCL (PF) 1 % IJ SOLN
INTRAMUSCULAR | Status: AC
  Filled 2016-07-30: qty 30

## 2016-07-30 MED ORDER — SODIUM CHLORIDE 0.9 % IV SOLN
250.0000 mL | INTRAVENOUS | Status: DC | PRN
Start: 2016-07-30 — End: 2016-07-31

## 2016-07-30 MED ORDER — ANGIOPLASTY BOOK
Freq: Once | Status: AC
Start: 2016-07-30 — End: 2016-07-30
  Administered 2016-07-30: 23:00:00
  Filled 2016-07-30: qty 1

## 2016-07-30 MED ORDER — TICAGRELOR 90 MG PO TABS
ORAL_TABLET | ORAL | Status: AC
Start: 2016-07-30 — End: 2016-07-30
  Filled 2016-07-30: qty 1

## 2016-07-30 MED ORDER — NITROGLYCERIN 0.4 MG SL SUBL: 0.4000 mg | SUBLINGUAL_TABLET | SUBLINGUAL | Status: DC | PRN

## 2016-07-30 MED ORDER — INSULIN NPH (HUMAN) (ISOPHANE) 100 UNIT/ML ~~LOC~~ SUSP
35.0000 [IU] | Freq: Every day | SUBCUTANEOUS | Status: DC
  Administered 2016-07-30: 35 [IU] via SUBCUTANEOUS

## 2016-07-30 MED ORDER — NITROGLYCERIN 2 % TD OINT
1.0000 [in_us] | TOPICAL_OINTMENT | Freq: Once | TRANSDERMAL | Status: AC
  Administered 2016-07-30: 1 [in_us] via TOPICAL
  Filled 2016-07-30: qty 1

## 2016-07-30 MED ORDER — MIDAZOLAM HCL 2 MG/2ML IJ SOLN
INTRAMUSCULAR | Status: AC
  Filled 2016-07-30: qty 2

## 2016-07-30 MED ORDER — ISOSORBIDE MONONITRATE ER 30 MG PO TB24
30.0000 mg | ORAL_TABLET | Freq: Every day | ORAL | Status: DC
  Administered 2016-07-31: 30 mg via ORAL
  Filled 2016-07-30 (×2): qty 1

## 2016-07-30 MED ORDER — CYCLOBENZAPRINE HCL 5 MG PO TABS
5.0000 mg | ORAL_TABLET | Freq: Two times a day (BID) | ORAL | Status: DC | PRN
  Filled 2016-07-30: qty 1

## 2016-07-30 MED ORDER — MIDAZOLAM HCL 2 MG/2ML IJ SOLN
INTRAMUSCULAR | Status: DC | PRN
  Administered 2016-07-30: 1 mg via INTRAVENOUS

## 2016-07-30 MED ORDER — VERAPAMIL HCL 2.5 MG/ML IV SOLN
INTRAVENOUS | Status: DC | PRN
  Administered 2016-07-30: 15:00:00 via INTRA_ARTERIAL

## 2016-07-30 MED ORDER — ALBUTEROL SULFATE (2.5 MG/3ML) 0.083% IN NEBU: 3.0000 mL | INHALATION_SOLUTION | Freq: Four times a day (QID) | RESPIRATORY_TRACT | Status: DC | PRN

## 2016-07-30 MED ORDER — INSULIN NPH (HUMAN) (ISOPHANE) 100 UNIT/ML ~~LOC~~ SUSP
40.0000 [IU] | Freq: Every day | SUBCUTANEOUS | Status: DC
  Administered 2016-07-31: 40 [IU] via SUBCUTANEOUS
  Filled 2016-07-30: qty 10

## 2016-07-30 MED ORDER — IOPAMIDOL (ISOVUE-370) INJECTION 76%
INTRAVENOUS | Status: DC | PRN
  Administered 2016-07-30: 190 mL

## 2016-07-30 MED ORDER — LIDOCAINE HCL (PF) 1 % IJ SOLN
INTRAMUSCULAR | Status: DC | PRN
  Administered 2016-07-30: 1 mL via SUBCUTANEOUS

## 2016-07-30 MED ORDER — ASPIRIN 81 MG PO CHEW
243.0000 mg | CHEWABLE_TABLET | Freq: Once | ORAL | Status: AC
  Administered 2016-07-30: 243 mg via ORAL
  Filled 2016-07-30: qty 3

## 2016-07-30 MED ORDER — TICAGRELOR 90 MG PO TABS
90.0000 mg | ORAL_TABLET | Freq: Two times a day (BID) | ORAL | Status: DC
  Administered 2016-07-31 (×2): 90 mg via ORAL
  Filled 2016-07-30 (×2): qty 1

## 2016-07-30 MED ORDER — IOPAMIDOL (ISOVUE-370) INJECTION 76%
INTRAVENOUS | Status: AC
  Filled 2016-07-30: qty 100

## 2016-07-30 MED ORDER — TICAGRELOR 90 MG PO TABS
ORAL_TABLET | ORAL | Status: AC
  Filled 2016-07-30: qty 1

## 2016-07-30 MED ORDER — INSULIN LISPRO 100 UNIT/ML (KWIKPEN): 0.0000 [IU] | PEN_INJECTOR | Freq: Three times a day (TID) | SUBCUTANEOUS | Status: DC

## 2016-07-30 MED ORDER — HEPARIN SODIUM (PORCINE) 1000 UNIT/ML IJ SOLN
INTRAMUSCULAR | Status: DC | PRN
Start: 2016-07-30 — End: 2016-07-30
  Administered 2016-07-30: 5000 [IU] via INTRAVENOUS
  Administered 2016-07-30: 3000 [IU] via INTRAVENOUS
  Administered 2016-07-30: 6000 [IU] via INTRAVENOUS
  Administered 2016-07-30: 4000 [IU] via INTRAVENOUS

## 2016-07-30 MED ORDER — TICAGRELOR 90 MG PO TABS
ORAL_TABLET | ORAL | Status: DC | PRN
Start: 2016-07-30 — End: 2016-07-30
  Administered 2016-07-30: 180 mg via ORAL

## 2016-07-30 MED ORDER — ASPIRIN EC 81 MG PO TBEC
81.0000 mg | DELAYED_RELEASE_TABLET | Freq: Every day | ORAL | Status: DC
  Administered 2016-07-31: 81 mg via ORAL
  Filled 2016-07-30 (×2): qty 1

## 2016-07-30 MED ORDER — FUROSEMIDE 40 MG PO TABS
40.0000 mg | ORAL_TABLET | Freq: Every day | ORAL | Status: DC
  Filled 2016-07-30 (×2): qty 1

## 2016-07-30 MED ORDER — ACETAMINOPHEN 325 MG PO TABS: 650.0000 mg | ORAL_TABLET | ORAL | Status: DC | PRN

## 2016-07-30 MED ORDER — FENTANYL CITRATE (PF) 100 MCG/2ML IJ SOLN
INTRAMUSCULAR | Status: DC | PRN
  Administered 2016-07-30: 25 ug via INTRAVENOUS

## 2016-07-30 MED ORDER — ONDANSETRON HCL 4 MG/2ML IJ SOLN: 4.0000 mg | Freq: Four times a day (QID) | INTRAMUSCULAR | Status: DC | PRN

## 2016-07-30 MED ORDER — HEPARIN (PORCINE) IN NACL 2-0.9 UNIT/ML-% IJ SOLN
INTRAMUSCULAR | Status: AC
  Filled 2016-07-30: qty 1500

## 2016-07-30 MED ORDER — FENOFIBRATE 160 MG PO TABS
160.0000 mg | ORAL_TABLET | Freq: Every day | ORAL | Status: DC
  Administered 2016-07-30: 160 mg via ORAL
  Filled 2016-07-30 (×2): qty 1

## 2016-07-30 MED ORDER — ENOXAPARIN SODIUM 40 MG/0.4ML ~~LOC~~ SOLN
40.0000 mg | SUBCUTANEOUS | Status: DC
  Filled 2016-07-30: qty 0.4

## 2016-07-30 MED ORDER — HEPARIN SODIUM (PORCINE) 1000 UNIT/ML IJ SOLN
INTRAMUSCULAR | Status: AC
  Filled 2016-07-30: qty 1

## 2016-07-30 MED ORDER — SODIUM CHLORIDE 0.9 % IV SOLN: INTRAVENOUS | Status: AC

## 2016-07-30 MED ORDER — SODIUM CHLORIDE 0.9% FLUSH: 3.0000 mL | Freq: Two times a day (BID) | INTRAVENOUS | Status: DC

## 2016-07-30 MED ORDER — SODIUM CHLORIDE 0.9% FLUSH: 3.0000 mL | INTRAVENOUS | Status: DC | PRN

## 2016-07-30 MED ORDER — VERAPAMIL HCL 2.5 MG/ML IV SOLN
INTRAVENOUS | Status: AC
  Filled 2016-07-30: qty 2

## 2016-07-30 MED ORDER — ATORVASTATIN CALCIUM 40 MG PO TABS
40.0000 mg | ORAL_TABLET | Freq: Every day | ORAL | Status: DC
  Filled 2016-07-30 (×2): qty 1

## 2016-07-30 MED ORDER — FENTANYL CITRATE (PF) 100 MCG/2ML IJ SOLN
INTRAMUSCULAR | Status: AC
  Filled 2016-07-30: qty 2

## 2016-07-30 MED ORDER — HEPARIN (PORCINE) IN NACL 2-0.9 UNIT/ML-% IJ SOLN
INTRAMUSCULAR | Status: DC | PRN
  Administered 2016-07-30: 16:00:00

## 2016-07-30 MED ORDER — METOPROLOL TARTRATE 50 MG PO TABS
50.0000 mg | ORAL_TABLET | Freq: Two times a day (BID) | ORAL | Status: DC
  Administered 2016-07-30 – 2016-07-31 (×2): 50 mg via ORAL
  Filled 2016-07-30 (×2): qty 2

## 2016-07-30 MED ORDER — NITROGLYCERIN 1 MG/10 ML FOR IR/CATH LAB
INTRA_ARTERIAL | Status: DC | PRN
  Administered 2016-07-30 (×2): 200 ug via INTRACORONARY

## 2016-07-30 MED ORDER — ALLOPURINOL 300 MG PO TABS
300.0000 mg | ORAL_TABLET | Freq: Every day | ORAL | Status: DC
  Filled 2016-07-30 (×2): qty 1

## 2016-07-30 MED ORDER — PANTOPRAZOLE SODIUM 20 MG PO TBEC
20.0000 mg | DELAYED_RELEASE_TABLET | Freq: Every day | ORAL | Status: DC
  Filled 2016-07-30 (×2): qty 1

## 2016-07-30 MED ORDER — IOPAMIDOL (ISOVUE-370) INJECTION 76%
INTRAVENOUS | Status: AC
Start: 2016-07-30 — End: 2016-07-30
  Filled 2016-07-30: qty 100

## 2016-07-30 MED ORDER — TAMSULOSIN HCL 0.4 MG PO CAPS
0.4000 mg | ORAL_CAPSULE | Freq: Every day | ORAL | Status: DC
  Filled 2016-07-30 (×2): qty 1

## 2016-07-30 SURGICAL SUPPLY — 25 items
BALLN EMERGE MR 2.0X15 (BALLOONS) ×2
BALLN EUPHORA RX 3.0X12 (BALLOONS) ×2
BALLN ~~LOC~~ EUPHORA RX 2.0X12 (BALLOONS) ×2
BALLN ~~LOC~~ EUPHORA RX 3.75X20 (BALLOONS) ×2
BALLN ~~LOC~~ TREK RX 3.0X12 (BALLOONS) ×2
BALLOON EMERGE MR 2.0X15 (BALLOONS) ×1 IMPLANT
BALLOON EUPHORA RX 3.0X12 (BALLOONS) ×1 IMPLANT
BALLOON ~~LOC~~ EUPHORA RX 2.0X12 (BALLOONS) ×1 IMPLANT
BALLOON ~~LOC~~ EUPHORA RX 3.75X20 (BALLOONS) ×1 IMPLANT
BALLOON ~~LOC~~ TREK RX 3.0X12 (BALLOONS) ×1 IMPLANT
CATH 5FR JL3.5 JR4 ANG PIG MP (CATHETERS) ×2 IMPLANT
CATH VISTA GUIDE 6FR XB3.5 (CATHETERS) ×2 IMPLANT
DEVICE RAD COMP TR BAND LRG (VASCULAR PRODUCTS) ×2 IMPLANT
GLIDESHEATH SLEND SS 6F .021 (SHEATH) ×2 IMPLANT
GUIDE CATH RUNWAY 6FR AL 75 (CATHETERS) ×2 IMPLANT
KIT ENCORE 26 ADVANTAGE (KITS) ×4 IMPLANT
KIT HEART LEFT (KITS) ×2 IMPLANT
PACK CARDIAC CATHETERIZATION (CUSTOM PROCEDURE TRAY) ×2 IMPLANT
STENT PROMUS PREM MR 3.0X16 (Permanent Stent) ×2 IMPLANT
STENT PROMUS PREM MR 3.5X28 (Permanent Stent) ×2 IMPLANT
TRANSDUCER W/STOPCOCK (MISCELLANEOUS) ×2 IMPLANT
TUBING CIL FLEX 10 FLL-RA (TUBING) ×2 IMPLANT
WIRE HI TORQ BMW 190CM (WIRE) ×2 IMPLANT
WIRE HI TORQ VERSACORE-J 145CM (WIRE) ×2 IMPLANT
WIRE RUNTHROUGH .014X180CM (WIRE) ×2 IMPLANT

## 2016-07-30 NOTE — ED Provider Notes (Signed)
[facility] DEPT Provider Note   CSN: [id number] Arrival date & time: 07/30/16  1053     History   Chief Complaint Chief Complaint  Patient presents with  . Chest Pain    HPI CLARIS William Hendrix is a 75 y.o. male.HPI Complains of anterior chest tightness nonradiating, typical of "heart pain" he's had in the past while walking in a store approximately an hour prior to coming here. He treated himself with 3 sublingual nitroglycerin with almost complete relief. Presently tightness is minimal. No associated sweatiness nausea or shortness of breath. He last had similar discomfort approximate 8 months ago. No other associated symptoms symptoms worse with exertion and improved with rest Past Medical History:  Diagnosis Date  . AAA (abdominal aortic aneurysm) (William Hendrix) 12-15-14   Mild aneurysmal dilatation of the infrarenal abdominal aorta 3.2 cm   . Arthritis   . Back problem   . CAD (coronary artery disease)    a. Stent 2000. b. Several caths since then with nonobst dz. c. 05/2013: IVUS-guided PTCA/DES to proximal-to-mid LAD, residual PDA disease for med rx unless recurrent angina. EF 55-60%.  . CHF (congestive heart failure) (William Hendrix)   . CKD (chronic kidney disease)     CKD stage III.. creatinine up to 1.5 in hospital after dye in October, 2011  . DM (diabetes mellitus) (William Hendrix)   . Ejection fraction    55%, 07/2010, mild inferior hypo  . GERD (gastroesophageal reflux disease)   . Gout   . H/O diverticulitis of colon 01/31/2015  . H/O hiatal hernia   . History of pneumonia   . HTN (hypertension)   . Hyperlipidemia   . Melanoma (William Hendrix)    Melanoma removed from the left forearm with wide excision May, 2012  . Nephrolithiasis   . Obesity   . Positive D-dimer    a. significant elevation ,hospital 07/2010, etiology unclear. b. D Dimer chronically > 20.  . Sinusitis   . Skin cancer   . Sleep apnea    CPAP,  [name] since 2000  . Spinal stenosis    a. s/p surgical repair 2013  . Ventral  hernia     Patient Active Problem List   Diagnosis Date Noted  . Uncontrolled type 2 diabetes mellitus with circulatory disorder, with long-term current use of insulin (William Hendrix) 11/28/2015  . Lumbar radiculopathy 10/09/2015  . Nail bed injury 05/22/2015  . Neuropathy, diabetic (William Hendrix) 05/22/2015  . H/O diverticulitis of colon 01/31/2015  . AAA (abdominal aortic aneurysm) (William Hendrix) 01/01/2015  . Sinusitis 10/10/2014  . Hip pain, bilateral 07/05/2014  . Palpitations 02/27/2014  . Claudication of lower extremity (William Hendrix) 10/25/2013  . BPH with obstruction/lower urinary tract symptoms 10/25/2013  . Hypertriglyceridemia 08/15/2012  . Melanoma (William Hendrix)   . Dizziness   . Renal insufficiency   . CAD (coronary artery disease)   . Hyperlipidemia   . HTN (hypertension)   . OSA (obstructive sleep apnea)   . Ejection fraction   . SOB (shortness of breath)   . PARESTHESIA 05/30/2009  . Overweight 01/22/2009  . EDEMA 01/22/2009  . OTHER ABNORMAL BLOOD CHEMISTRY 04/25/2008  . Gout 10/21/2007  . DEPRESSION 10/21/2007  . VENTRAL HERNIA 10/21/2007  . HIATAL HERNIA 10/21/2007  . FATIGUE 10/21/2007  . SKIN CANCER, HX OF 10/21/2007  . NEPHROLITHIASIS, HX OF 10/21/2007    Past Surgical History:  Procedure Laterality Date  . BACK SURGERY    . CARDIAC CATHETERIZATION    . CERVICAL DISC SURGERY  90's  . CORONARY ANGIOPLASTY  WITH STENT PLACEMENT  07/26/2013   DES to RCA extending to PDA    . CORONARY STENT PLACEMENT  2000   CAD  . KNEE ARTHROSCOPY  90's   rt  . LEFT AND RIGHT HEART CATHETERIZATION WITH CORONARY ANGIOGRAM N/A 07/26/2013   Procedure: LEFT AND RIGHT HEART CATHETERIZATION WITH CORONARY ANGIOGRAM;  Surgeon: [name], MD;  Location: [facility] CATH LAB;  Service: Cardiovascular;  Laterality: N/A;  . LEFT HEART CATHETERIZATION WITH CORONARY ANGIOGRAM N/A 05/19/2013   Procedure: LEFT HEART CATHETERIZATION WITH CORONARY ANGIOGRAM;  Surgeon: [name], MD;  Location: [facility] CATH LAB;  Service:  Cardiovascular;  Laterality: N/A;  . LUMBAR LAMINECTOMY/DECOMPRESSION MICRODISCECTOMY  08/30/2012   Procedure: LUMBAR LAMINECTOMY/DECOMPRESSION MICRODISCECTOMY 2 LEVELS;  Surgeon: [name], MD;  Location: [facility] NEURO ORS;  Service: Neurosurgery;  Laterality: Bilateral;  Bilateral Lumbar three-four Lumbar four-five Laminotomies  . NOSE SURGERY    . PERCUTANEOUS CORONARY STENT INTERVENTION (PCI-S)  05/19/2013   Procedure: PERCUTANEOUS CORONARY STENT INTERVENTION (PCI-S);  Surgeon: [name], MD;  Location: [facility] CATH LAB;  Service: Cardiovascular;;       Home Medications    Prior to Admission medications   Medication Sig Start Date End Date Taking? Authorizing Provider  acetaminophen (TYLENOL) 500 MG tablet Take 1,000 mg by mouth 2 (two) times daily.    Historical Provider, MD  albuterol (PROVENTIL HFA;VENTOLIN HFA) 108 (90 BASE) MCG/ACT inhaler Inhale 2 puffs into the lungs every 6 (six) hours as needed for wheezing or shortness of breath. 04/05/15   [name], MD  Albuterol Sulfate (PROAIR RESPICLICK) 741 (90 BASE) MCG/ACT AEPB Inhale 2 puffs into the lungs every 4 (four) hours as needed. 04/08/15   [name], MD  allopurinol (ZYLOPRIM) 300 MG tablet TAKE 1 TABLET EVERY DAY 02/25/16   [name], MD  aspirin EC 81 MG tablet Take 81 mg by mouth daily. 02/23/11   [name], MD  atorvastatin (LIPITOR) 40 MG tablet Take 1 tablet (40 mg total) by mouth daily. 05/15/16   [name], MD  BD INSULIN SYRINGE ULTRAFINE 31G X 5/16" 1 ML MISC USE  TO INJECT  INSULIN  FIVE  TIMES  DAILY AS INSTRUCTED 06/04/16   [name], MD  Blood Glucose Monitoring Suppl (BLOOD GLUCOSE METER) kit Use as instructed 12/20/13   [name], MD  Blood Glucose Monitoring Suppl (TRUE METRIX AIR GLUCOSE METER) DEVI 1 each by Does not apply route daily. 04/02/15   [name], MD  Cholecalciferol (VITAMIN D) 1000 UNITS capsule Take 1,000 Units by mouth at bedtime.     Historical Provider, MD    cyclobenzaprine (FLEXERIL) 5 MG tablet Take 1 tablet (5 mg total) by mouth 3 (three) times daily as needed for muscle spasms. 05/15/16   [name], MD  fenofibrate 160 MG tablet TAKE 1 TABLET EVERY DAY 12/02/15   [name], MD  fluocinonide cream (LIDEX) 2.87 % Apply 1 application topically daily as needed. FOR RASH 08/11/12   Historical Provider, MD  fluticasone (FLONASE) 50 MCG/ACT nasal spray Place 2 sprays into both nostrils daily. Patient taking differently: Place 2 sprays into both nostrils as needed.  10/10/14   Percell Miller Saguier, PA-C  furosemide (LASIX) 80 MG tablet TAKE 1 TABLET EVERY DAY 12/02/15   [name], MD  glucose blood (TRUE METRIX BLOOD GLUCOSE TEST) test strip Use to test blood sugar 4 times daily as instructed. Dx: E11.9 03/21/15   [name], MD  glucose blood (TRUE METRIX BLOOD GLUCOSE TEST) test strip USE TO TEST BLOOD SUGAR THREE TIMES DAILY AS DIRECTED 05/28/16   [name], MD  glucose blood (TRUETEST TEST) test strip Use to test blood sugar 3 times daily as instructed. Dx code: E11.9 and Lancets 3/day 04/02/15   [name], MD  HYDROcodone-acetaminophen (NORCO) 5-325 MG tablet Take 1 tablet by mouth every 6 (six) hours as needed for moderate pain. 03/05/16   Percell Miller Saguier, PA-C  insulin NPH Human (NOVOLIN N) 100 UNIT/ML injection Inject 40 units in am and 35 units in pm 05/28/16   [name], MD  Insulin Pen Needle (PEN NEEDLES 31GX5/16") 31G X 8 MM MISC Use to inject insulin 2 times daily as instructed. 05/28/16   [name], MD  insulin regular (NOVOLIN R RELION) 100 units/mL injection Inject 0.25-0.3 mLs (25-30 Units total) into the skin 3 (three) times daily before meals. 11/28/15   [name], MD  isosorbide mononitrate (IMDUR) 30 MG 24 hr tablet TAKE 1 TABLET EVERY DAY 08/01/15   [name], MD  metoprolol (LOPRESSOR) 50 MG tablet TAKE 1 TABLET TWICE DAILY 12/02/15   [name], MD  nitroGLYCERIN (NITROSTAT) 0.4 MG SL  tablet Place 1 tablet (0.4 mg total) under the tongue every 5 (five) minutes as needed for chest pain. 07/05/14   [name], MD  Omega-3 Fatty Acids (FISH OIL PO) Take by mouth daily.    Historical Provider, MD  oxybutynin (DITROPAN) 5 MG tablet Take 1 tablet by mouth 2 (two) times daily. 10/24/14   Historical Provider, MD  oxyCODONE-acetaminophen (PERCOCET/ROXICET) 5-325 MG tablet  04/08/16   Historical Provider, MD  pantoprazole (PROTONIX) 20 MG tablet Take 1 tablet (20 mg total) by mouth daily. 05/26/16   [name], MD  tamsulosin (FLOMAX) 0.4 MG CAPS capsule TAKE 1 CAPSULE EVERY DAY 03/18/16   [name], MD  TRUEPLUS LANCETS 28G MISC Use to test blood glucose 4 times daily as instructed. Dx code: E11.9 03/21/15   [name], MD    Family History Family History  Problem Relation Age of Onset  . Cancer Mother     intestinal   . Heart disease Mother   . Cancer Father     prostate  . Migraines Daughter   . Leukemia Sister   . Heart disease Sister   . Prostate cancer    . Kidney cancer    . Cancer      Bladder cancer  . Coronary artery disease    . Hypertension Sister   . Hypertension Brother   . Heart attack Neg Hx   . Stroke Neg Hx     Social History Social History  Substance Use Topics  . Smoking status: Former Smoker    Packs/day: 3.00    Years: 30.00    Types: Cigarettes    Quit date: 09/17/1996  . Smokeless tobacco: Never Used  . Alcohol use No     Allergies   Levemir [insulin detemir]; Codeine; Lipitor [atorvastatin]; and Novolog mix [insulin aspart prot & aspart]   Review of Systems Review of Systems  Constitutional: Negative.   HENT: Negative.   Respiratory: Negative.   Cardiovascular: Positive for chest pain.       Syncope  Gastrointestinal: Negative.   Musculoskeletal: Negative.   Skin: Negative.   Allergic/Immunologic: Positive for immunocompromised state.       Diabetic  Neurological: Negative.   Psychiatric/Behavioral: Negative.     All other systems reviewed and  are negative.    Physical Exam Updated Vital Signs BP 112/68 (BP Location: Right Arm)   Pulse 80   Temp 97.9 F (36.6 C) (Oral)   Resp 16   Ht '5\' 10"'  (1.778 m)   Wt 260 lb (117.9 kg)   SpO2 98%   BMI 37.31 kg/m   Physical Exam  Constitutional: He appears well-developed and well-nourished.  HENT:  Head: Normocephalic and atraumatic.  Eyes: Conjunctivae are normal. Pupils are equal, round, and reactive to light.  Neck: Neck supple. No tracheal deviation present. No thyromegaly present.  Cardiovascular: Normal rate and regular rhythm.   No murmur heard. Pulmonary/Chest: Effort normal and breath sounds normal.  Abdominal: Soft. Bowel sounds are normal. He exhibits no distension. There is no tenderness.  Obese  Musculoskeletal: Normal range of motion. He exhibits no edema or tenderness.  Neurological: He is alert. Coordination normal.  Skin: Skin is warm and dry. No rash noted.  Psychiatric: He has a normal mood and affect.  Nursing note and vitals reviewed.    ED Treatments / Results  Labs (all labs ordered are listed, but only abnormal results are displayed) Labs Reviewed  BASIC METABOLIC PANEL - Abnormal; Notable for the following:       Result Value   Glucose, Bld 164 (*)    BUN 28 (*)    Creatinine, Ser 1.37 (*)    GFR calc non Af Amer 49 (*)    GFR calc Af Amer 56 (*)    All other components within normal limits  CBC  I-STAT TROPOININ, ED    EKG  EKG Interpretation  Date/Time:  Thursday July 30 2016 10:57:22 EDT Ventricular Rate:  77 PR Interval:  176 QRS Duration: 92 QT Interval:  356 QTC Calculation: 402 R Axis:   -78 Text Interpretation:  Normal sinus rhythm Pulmonary disease pattern Incomplete right bundle branch block Left anterior fascicular block Nonspecific T wave abnormality Abnormal ECG No significant change since last tracing Confirmed by Winfred Leeds  MD, Lilyanna Lunt [phone]) on 07/30/2016 11:03:11 AM        Radiology Dg Chest 2 View  Result Date: 07/30/2016 CLINICAL DATA:  Chest  Pain since  This  am EXAM: CHEST  2 VIEW COMPARISON:  05/18/2013 FINDINGS: Heart size is upper limits normal. Coronary stent is visible. The lungs are free of focal consolidations and pleural effusions. No pulmonary edema. Mid thoracic spondylosis noted. IMPRESSION: No active cardiopulmonary disease. Electronically Signed   By: Nolon Nations M.D.   On: 07/30/2016 11:27    Procedures Procedures (including critical care time)  Medications Ordered in ED Medications  nitroGLYCERIN (NITROGLYN) 2 % ointment 1 inch (not administered)  aspirin chewable tablet 243 mg (not administered)    Results for orders placed or performed during the hospital encounter of 00/93/81  Basic metabolic panel  Result Value Ref Range   Sodium 140 135 - 145 mmol/L   Potassium 3.9 3.5 - 5.1 mmol/L   Chloride 104 101 - 111 mmol/L   CO2 27 22 - 32 mmol/L   Glucose, Bld 164 (H) 65 - 99 mg/dL   BUN 28 (H) 6 - 20 mg/dL   Creatinine, Ser 1.37 (H) 0.61 - 1.24 mg/dL   Calcium 10.0 8.9 - 10.3 mg/dL   GFR calc non Af Amer 49 (L) >60 mL/min   GFR calc Af Amer 56 (L) >60 mL/min   Anion gap 9 5 - 15  CBC  Result Value Ref Range   WBC 8.5 4.0 -  10.5 K/uL   RBC 4.72 4.22 - 5.81 MIL/uL   Hemoglobin 14.8 13.0 - 17.0 g/dL   HCT 43.2 39.0 - 52.0 %   MCV 91.5 78.0 - 100.0 fL   MCH 31.4 26.0 - 34.0 pg   MCHC 34.3 30.0 - 36.0 g/dL   RDW 14.0 11.5 - 15.5 %   Platelets 168 150 - 400 K/uL  I-stat troponin, ED  Result Value Ref Range   Troponin i, poc 0.01 0.00 - 0.08 ng/mL   Comment 3           Dg Chest 2 View  Result Date: 07/30/2016 CLINICAL DATA:  Chest  Pain since  This  am EXAM: CHEST  2 VIEW COMPARISON:  05/18/2013 FINDINGS: Heart size is upper limits normal. Coronary stent is visible. The lungs are free of focal consolidations and pleural effusions. No pulmonary edema. Mid thoracic spondylosis noted. IMPRESSION: No active  cardiopulmonary disease. Electronically Signed   By: Nolon Nations M.D.   On: 07/30/2016 11:27    Initial Impression / Assessment and Plan / ED Course  I have reviewed the triage vital signs and the nursing notes.  Pertinent labs & imaging results that were available during my care of the patient were reviewed by me and considered in my medical decision making (see chart for details).  Clinical Course    1:20 PM patient asymptomatic and pain-free after treatment with nitroglycerin paste and aspirin. Cardiology service consulted and evaluated patient in the emergency department and arrange for inpatient stay. Renal insufficiency is chronic Final Clinical Impressions(s) / ED Diagnoses  Diagnosis #1 unstable angina #2 hyperglycemia #3 chronic renal insufficiency Final diagnoses:  None    New Prescriptions New Prescriptions   No medications on file     Orlie Dakin, MD 07/30/16 1323

## 2016-07-30 NOTE — Progress Notes (Signed)
TR BAND REMOVAL  LOCATION:    right radial  DEFLATED PER PROTOCOL:    Yes.    TIME BAND OFF / DRESSING APPLIED:    21:00   SITE UPON ARRIVAL:    Level 0  SITE AFTER BAND REMOVAL:    Level 0  CIRCULATION SENSATION AND MOVEMENT:    Within Normal Limits   Yes.    COMMENTS:   Post TR band instructions given. Pt tolerated well. 

## 2016-07-30 NOTE — H&P (Signed)
Cardiology Consult    Patient ID: William Hendrix MRN: [id number], DOB/AGE: 76-May-1942   Admit date: 07/30/2016 Date of Consult: 07/30/2016  Primary Physician: Penni Homans, MD Primary Cardiologist: Dr. Stanford Breed   Patient Profile    William Hendrix is a 75 year old male with a past medical history of CAD(last cath was in 2014, with DES to distal RCA to ostial RCA), AAA (3.2 cm), DM, HTN, OSA on CPAP and CKD.   History of Present Illness    William Hendrix was shopping around at [facility] today, and walked very quickly to the restroom that was on the other side of the store. He developed acute onset substernal chest pain. His pain was associated with diaphoresis, however he denies associated nausea and shortness of breath. His daughter was with him at [facility] and says that he became very pale and diaphoretic and his appearance was similar to that of his last heart attack. His family took him to the emergency room. He took 3 sublingual nitroglycerin without relief of his pain. His EKG showed normal sinus rhythm with no acute ST changes.  His chest pain was relieved with Nitropaste however at the time of my encounter he still has some chest tightness. Initial troponin was negative.  William Hendrix says the characteristics of this pain are reminiscent of his previous angina. The last some experiencing angina was in August 2014, at that time he underwent left heart cath and had drug-eluting stent placed to his mid LAD where there was a prior BMS.He had residual stenosis in his PDA which was going to be managed medically however, he developed chest pain 2 months later in October 2014 and underwent heart cath and received a drug-eluting stent to his PDA. He has had no problems with angina since.  He remains fairly active. He mowed his lawn yesterday without angina. He is a former smoker, denies ETOH use.      Past Medical History   Past Medical History:  Diagnosis Date  . AAA (abdominal aortic  aneurysm) ([place]) 12-15-14   Mild aneurysmal dilatation of the infrarenal abdominal aorta 3.2 cm   . Arthritis   . Back problem   . CAD (coronary artery disease)    a. Stent 2000. b. Several caths since then with nonobst dz. c. 05/2013: IVUS-guided PTCA/DES to proximal-to-mid LAD, residual PDA disease for med rx unless recurrent angina. EF 55-60%.  . CHF (congestive heart failure) ([place])   . CKD (chronic kidney disease)     CKD stage III.. creatinine up to 1.5 in hospital after dye in October, 2011  . DM (diabetes mellitus) ([place])   . Ejection fraction    55%, 07/2010, mild inferior hypo  . GERD (gastroesophageal reflux disease)   . Gout   . H/O diverticulitis of colon 01/31/2015  . H/O hiatal hernia   . History of pneumonia   . HTN (hypertension)   . Hyperlipidemia   . Melanoma ([place])    Melanoma removed from the left forearm with wide excision May, 2012  . Nephrolithiasis   . Obesity   . Positive D-dimer    a. significant elevation ,hospital 07/2010, etiology unclear. b. D Dimer chronically > 20.  . Sinusitis   . Skin cancer   . Sleep apnea    CPAP,  [name] since 2000  . Spinal stenosis    a. s/p surgical repair 2013  . Ventral hernia     Past Surgical History:  Procedure Laterality Date  . BACK  SURGERY    . CARDIAC CATHETERIZATION    . CERVICAL DISC SURGERY  90's  . CORONARY ANGIOPLASTY WITH STENT PLACEMENT  07/26/2013   DES to RCA extending to PDA    . CORONARY STENT PLACEMENT  2000   CAD  . KNEE ARTHROSCOPY  90's   rt  . LEFT AND RIGHT HEART CATHETERIZATION WITH CORONARY ANGIOGRAM N/A 07/26/2013   Procedure: LEFT AND RIGHT HEART CATHETERIZATION WITH CORONARY ANGIOGRAM;  Surgeon: [name], MD;  Location: [facility] CATH LAB;  Service: Cardiovascular;  Laterality: N/A;  . LEFT HEART CATHETERIZATION WITH CORONARY ANGIOGRAM N/A 05/19/2013   Procedure: LEFT HEART CATHETERIZATION WITH CORONARY ANGIOGRAM;  Surgeon: [name], MD;  Location: [facility] CATH LAB;  Service:  Cardiovascular;  Laterality: N/A;  . LUMBAR LAMINECTOMY/DECOMPRESSION MICRODISCECTOMY  08/30/2012   Procedure: LUMBAR LAMINECTOMY/DECOMPRESSION MICRODISCECTOMY 2 LEVELS;  Surgeon: [name], MD;  Location: [facility] NEURO ORS;  Service: Neurosurgery;  Laterality: Bilateral;  Bilateral Lumbar three-four Lumbar four-five Laminotomies  . NOSE SURGERY    . PERCUTANEOUS CORONARY STENT INTERVENTION (PCI-S)  05/19/2013   Procedure: PERCUTANEOUS CORONARY STENT INTERVENTION (PCI-S);  Surgeon: [name], MD;  Location: [facility] CATH LAB;  Service: Cardiovascular;;     Allergies  Allergies  Allergen Reactions  . Levemir [Insulin Detemir] Swelling    Patient had redness and swelling and tenderness at injection site.  . Codeine Other (See Comments)    Makes him "shakey", is OK with hydrocodone GI UPSET & TREMORS  . Lipitor [Atorvastatin] Other (See Comments)    Muscle aches; liver functions. Patient is currently taken  . Novolog Mix [Insulin Aspart Prot & Aspart]     Causes skin to swell/itch at injection site    Inpatient Medications      Family History    Family History  Problem Relation Age of Onset  . Cancer Mother     intestinal   . Heart disease Mother   . Cancer Father     prostate  . Migraines Daughter   . Leukemia Sister   . Heart disease Sister   . Prostate cancer    . Kidney cancer    . Cancer      Bladder cancer  . Coronary artery disease    . Hypertension Sister   . Hypertension Brother   . Heart attack Neg Hx   . Stroke Neg Hx     Social History    Social History   Social History  . Marital status: Married    Spouse name: N/A  . Number of children: N/A  . Years of education: N/A   Occupational History  . [employer]    Social History Main Topics  . Smoking status: Former Smoker    Packs/day: 3.00    Years: 30.00    Types: Cigarettes    Quit date: 09/17/1996  . Smokeless tobacco: Never Used  . Alcohol use No  . Drug use: No  . Sexual activity: Not  Currently    Birth control/ protection: None   Other Topics Concern  . Not on file   Social History Narrative   Married and lives locally with his wife.  [employer].     Review of Systems    General:  No chills, fever, night sweats or weight changes.  Cardiovascular:  + chest pain, dyspnea on exertion, edema, orthopnea, palpitations, paroxysmal nocturnal dyspnea. Dermatological: No rash, lesions/masses Respiratory: No cough, dyspnea Urologic: No hematuria, dysuria Abdominal:   No nausea, vomiting, diarrhea, bright red blood  per rectum, melena, or hematemesis Neurologic:  No visual changes, wkns, changes in mental status. All other systems reviewed and are otherwise negative except as noted above.  Physical Exam    Blood pressure 109/57, pulse 77, temperature 97.9 F (36.6 C), temperature source Oral, resp. rate 15, height 5\' 10"  (1.778 m), weight 260 lb (117.9 kg), SpO2 97 %.  General: Pleasant, NAD Psych: Normal affect. Neuro: Alert and oriented X 3. Moves all extremities spontaneously. HEENT: Normal  Neck: Supple without bruits or JVD. Lungs:  Resp regular and unlabored, CTA. Heart: RRR no s3, s4, or murmurs. Abdomen: Soft, non-tender, non-distended, BS + x 4.  Extremities: No clubbing, cyanosis or edema. DP/PT/Radials 2+ and equal bilaterally.  Labs    Troponin (Point of Care Test)  Recent Labs  07/30/16 1106  TROPIPOC 0.01   No results for input(s): CKTOTAL, CKMB, TROPONINI in the last 72 hours. Lab Results  Component Value Date   WBC 8.5 07/30/2016   HGB 14.8 07/30/2016   HCT 43.2 07/30/2016   MCV 91.5 07/30/2016   PLT 168 07/30/2016    Recent Labs Lab 07/30/16 1100  NA 140  K 3.9  CL 104  CO2 27  BUN 28*  CREATININE 1.37*  CALCIUM 10.0  GLUCOSE 164*   Lab Results  Component Value Date   CHOL 133 05/15/2016   HDL 26.80 (L) 05/15/2016   LDLCALC 31 04/09/2014   TRIG 214.0 (H) 05/15/2016   Lab Results  Component Value Date   DDIMER >20.00 (H)  02/23/2011     Radiology Studies    Dg Chest 2 View  Result Date: 07/30/2016 CLINICAL DATA:  Chest  Pain since  This  am EXAM: CHEST  2 VIEW COMPARISON:  05/18/2013 FINDINGS: Heart size is upper limits normal. Coronary stent is visible. The lungs are free of focal consolidations and pleural effusions. No pulmonary edema. Mid thoracic spondylosis noted. IMPRESSION: No active cardiopulmonary disease. Electronically Signed   By: Nolon Nations M.D.   On: 07/30/2016 11:27    EKG & Cardiac Imaging    EKG: NSR    Assessment & Plan    1. Unstable angina: Patient presents with acute onset substernal chest pain reminiscent of his previous angina. Patient has a previous history of CAD. Initial troponin is negative and without acute ST changes on his EKG. However given his history and his risk factors it is best that we evaluate his anatomy with left heart catheterization. Plan for left heart cath today.  He is on antianginal therapy with beta blocker and long-acting nitrates. We can increase his isosorbide if he continues to have angina post cath.  The risks and benefits of a cardiac catheterization including, but not limited to, death, stroke, MI, kidney damage and bleeding were discussed with the patient who indicates understanding and agrees to proceed.   2. Hypertension: Well controlled on beta blocker, and isosorbide.  3. Hyperlipidemia: Last LDL was 61. Continue atorvastatin 40 mg.     Signed, Arbutus Leas, NP 07/30/2016, 12:54 PM Pager: [phone]  I have examined the patient and reviewed assessment and plan and discussed with patient.  Agree with above as stated.  Unstable angina.  Second troponin increased slightly.  Plan for cath.  Procedure explained to the patient.  All questions answered.  Larae Grooms

## 2016-07-30 NOTE — H&P (Signed)
CARDIAC CATHETERIZATION HISTORY AND PHYSICAL  07/30/16 2:31 PM  Chief complaint: Chest pain  History of present illness: 75 year old man with history of coronary artery disease status post prior PCI's to the LAD and RCA, AAA, diabetes, hypertension, history of sleep apnea, chronic kidney disease, who presented to the ER after developing acute onset of chest pain initially while walking but persistent at rest while at [employer]. He notes the pain was substernal associated diaphoresis. He is brought to the emergency department for further evaluation. He has had improvement but incomplete resolution of his chest pain despite receiving supplemental nitroglycerin. Initial troponin is 0.04. Given his continued chest pain and history of CAD with additional comorbidities, he has been referred for urgent cardiac catheterization.  Past Medical History:  Diagnosis Date  . AAA (abdominal aortic aneurysm) ([place]) 12-15-14   Mild aneurysmal dilatation of the infrarenal abdominal aorta 3.2 cm   . Arthritis   . Back problem   . CAD (coronary artery disease)    a. Stent 2000. b. Several caths since then with nonobst dz. c. 05/2013: IVUS-guided PTCA/DES to proximal-to-mid LAD, residual PDA disease for med rx unless recurrent angina. EF 55-60%.  . CHF (congestive heart failure) ([place])   . CKD (chronic kidney disease)     CKD stage III.. creatinine up to 1.5 in hospital after dye in October, 2011  . DM (diabetes mellitus) ([place])   . Ejection fraction    55%, 07/2010, mild inferior hypo  . GERD (gastroesophageal reflux disease)   . Gout   . H/O diverticulitis of colon 01/31/2015  . H/O hiatal hernia   . History of pneumonia   . HTN (hypertension)   . Hyperlipidemia   . Melanoma ([place])    Melanoma removed from the left forearm with wide excision May, 2012  . Nephrolithiasis   . Obesity   . Positive D-dimer    a. significant elevation ,hospital 07/2010, etiology unclear. b. D Dimer chronically > 20.  .  Sinusitis   . Skin cancer   . Sleep apnea    CPAP,  [name] since 2000  . Spinal stenosis    a. s/p surgical repair 2013  . Ventral hernia    Past Surgical History:  Procedure Laterality Date  . BACK SURGERY    . CARDIAC CATHETERIZATION    . CERVICAL DISC SURGERY  90's  . CORONARY ANGIOPLASTY WITH STENT PLACEMENT  07/26/2013   DES to RCA extending to PDA    . CORONARY STENT PLACEMENT  2000   CAD  . KNEE ARTHROSCOPY  90's   rt  . LEFT AND RIGHT HEART CATHETERIZATION WITH CORONARY ANGIOGRAM N/A 07/26/2013   Procedure: LEFT AND RIGHT HEART CATHETERIZATION WITH CORONARY ANGIOGRAM;  Surgeon: [name], MD;  Location: [facility] CATH LAB;  Service: Cardiovascular;  Laterality: N/A;  . LEFT HEART CATHETERIZATION WITH CORONARY ANGIOGRAM N/A 05/19/2013   Procedure: LEFT HEART CATHETERIZATION WITH CORONARY ANGIOGRAM;  Surgeon: [name], MD;  Location: [facility] CATH LAB;  Service: Cardiovascular;  Laterality: N/A;  . LUMBAR LAMINECTOMY/DECOMPRESSION MICRODISCECTOMY  08/30/2012   Procedure: LUMBAR LAMINECTOMY/DECOMPRESSION MICRODISCECTOMY 2 LEVELS;  Surgeon: [name], MD;  Location: [facility] NEURO ORS;  Service: Neurosurgery;  Laterality: Bilateral;  Bilateral Lumbar three-four Lumbar four-five Laminotomies  . NOSE SURGERY    . PERCUTANEOUS CORONARY STENT INTERVENTION (PCI-S)  05/19/2013   Procedure: PERCUTANEOUS CORONARY STENT INTERVENTION (PCI-S);  Surgeon: [name], MD;  Location: [facility] CATH LAB;  Service: Cardiovascular;;   Allergies  Allergen Reactions  .  Levemir [Insulin Detemir] Swelling    Patient had redness and swelling and tenderness at injection site.  . Codeine Other (See Comments)    Makes him "shakey", is OK with hydrocodone GI UPSET & TREMORS  . Lipitor [Atorvastatin] Other (See Comments)    Muscle aches; liver functions. Patient is currently taken  . Novolog Mix [Insulin Aspart Prot & Aspart]     Causes skin to swell/itch at injection site   No current  facility-administered medications on file prior to encounter.    Current Outpatient Prescriptions on File Prior to Encounter  Medication Sig Dispense Refill  . acetaminophen (TYLENOL) 500 MG tablet Take 1,000 mg by mouth 2 (two) times daily.    Marland Kitchen allopurinol (ZYLOPRIM) 300 MG tablet TAKE 1 TABLET EVERY DAY 90 tablet 1  . aspirin EC 81 MG tablet Take 81 mg by mouth daily.    Marland Kitchen atorvastatin (LIPITOR) 40 MG tablet Take 1 tablet (40 mg total) by mouth daily. 90 tablet 0  . Cholecalciferol (VITAMIN D) 1000 UNITS capsule Take 1,000 Units by mouth at bedtime.     . cyclobenzaprine (FLEXERIL) 5 MG tablet Take 1 tablet (5 mg total) by mouth 3 (three) times daily as needed for muscle spasms. (Patient taking differently: Take 5 mg by mouth 2 (two) times daily as needed for muscle spasms. ) 30 tablet 1  . fenofibrate 160 MG tablet TAKE 1 TABLET EVERY DAY (Patient taking differently: TAKE 1 TABLET EVERY night) 90 tablet 1  . fluocinonide cream (LIDEX) AB-123456789 % Apply 1 application topically daily as needed. FOR RASH    . furosemide (LASIX) 80 MG tablet TAKE 1 TABLET EVERY DAY (Patient taking differently: TAKE 1/2 TABLET EVERY DAY) 90 tablet 1  . insulin NPH Human (NOVOLIN N) 100 UNIT/ML injection Inject 40 units in am and 35 units in pm (Patient taking differently: Inject 40 Units into the skin 2 (two) times daily before a meal. ) 70 mL 1  . insulin regular (NOVOLIN R RELION) 100 units/mL injection Inject 0.25-0.3 mLs (25-30 Units total) into the skin 3 (three) times daily before meals. (Patient taking differently: Inject 30 Units into the skin 3 (three) times daily before meals. ) 70 mL 1  . isosorbide mononitrate (IMDUR) 30 MG 24 hr tablet TAKE 1 TABLET EVERY DAY 90 tablet 3  . metoprolol (LOPRESSOR) 50 MG tablet TAKE 1 TABLET TWICE DAILY 180 tablet 1  . nitroGLYCERIN (NITROSTAT) 0.4 MG SL tablet Place 1 tablet (0.4 mg total) under the tongue every 5 (five) minutes as needed for chest pain. 25 tablet 3  .  pantoprazole (PROTONIX) 20 MG tablet Take 1 tablet (20 mg total) by mouth daily. 90 tablet 3  . tamsulosin (FLOMAX) 0.4 MG CAPS capsule TAKE 1 CAPSULE EVERY DAY 90 capsule 1  . albuterol (PROVENTIL HFA;VENTOLIN HFA) 108 (90 BASE) MCG/ACT inhaler Inhale 2 puffs into the lungs every 6 (six) hours as needed for wheezing or shortness of breath. 18 Inhaler 2  . fluticasone (FLONASE) 50 MCG/ACT nasal spray Place 2 sprays into both nostrils daily. (Patient taking differently: Place 2 sprays into both nostrils as needed. ) 16 g 1  . HYDROcodone-acetaminophen (NORCO) 5-325 MG tablet Take 1 tablet by mouth every 6 (six) hours as needed for moderate pain. (Patient not taking: Reported on 07/30/2016) 30 tablet 0     Family History  Problem Relation Age of Onset  . Cancer Mother     intestinal   . Heart disease Mother   .  Cancer Father     prostate  . Migraines Daughter   . Leukemia Sister   . Heart disease Sister   . Prostate cancer    . Kidney cancer    . Cancer      Bladder cancer  . Coronary artery disease    . Hypertension Sister   . Hypertension Brother   . Heart attack Neg Hx   . Stroke Neg Hx    Social History  Substance Use Topics  . Smoking status: Former Smoker    Packs/day: 3.00    Years: 30.00    Types: Cigarettes    Quit date: 09/17/1996  . Smokeless tobacco: Never Used  . Alcohol use No   Review of systems: A 12 system review of systems performed and was negative, except as noted in the history of present illness.  Physical exam: Temp:  [97.9 F (36.6 C)] 97.9 F (36.6 C) (10/19 1058) Pulse Rate:  [74-80] 74 (10/19 1400) Resp:  [15-18] 16 (10/19 1400) BP: (105-124)/(57-91) 113/70 (10/19 1400) SpO2:  [96 %-98 %] 96 % (10/19 1400) Weight:  [117.9 kg (260 lb)] 117.9 kg (260 lb) (10/19 1059) Gen.: Obese man lying comfortably in bed. Lungs: Clear to auscultation bilaterally. Heart: Regular rate and rhythm without murmurs, rubs, or gallops Abdomen: Bowel sounds  present. Soft, nontender, nondistended. Extremities: 2+ radial and femoral pulses bilaterally.  EKG: Normal sinus rhythm, left anterior fascicular block, poor R-wave progression (question lead placement), and nonspecific T-wave changes.  Lab Results  Component Value Date   WBC 8.5 07/30/2016   HGB 14.8 07/30/2016   HCT 43.2 07/30/2016   MCV 91.5 07/30/2016   PLT 168 07/30/2016   Lab Results  Component Value Date   CREATININE 1.37 (H) 07/30/2016   Assessment/plan: 75 year old male with history of CAD with multiple PCI's, hypertension, hyperlipidemia, diabetes mellitus, CK, and OSA, who presented to ER with acute onset of chest pain with mild exertion that has persisted at rest. Given his history and continued pain, we will proceed with urgent left heart catheterization with possible PCI today. I have reviewed the risks, indications, and alternatives to cardiac catheterization, possible angioplasty, and stenting with the patient. Risks include but are not limited to bleeding, infection, vascular injury, stroke, myocardial infection, arrhythmia, kidney injury, radiation-related injury in the case of prolonged fluoroscopy use, emergency cardiac surgery, and death. The patient understands the risks of serious complication is 1-2 in [id number] with diagnostic cardiac cath and 1-2% or less with angioplasty/stenting. We will plan for a right radial artery approach.  Nelva Bush, MD [facility] Pager: [phone]

## 2016-07-30 NOTE — Care Management Note (Addendum)
Case Management Note  Patient Details  Name: William Hendrix MRN: [id number] Date of Birth: 1941/09/30  Subjective/Objective:      NSTEMI              Action/Plan: Discharge Planning:  Provided pt with Brilinta 30 day free trial card. Will checks benefits for medications. Contacted [facility]. Brilinta copay for 90 day supply $141.00. Pt states he used [employer]. Wife states she will call to see what mail order cost will run. [facility] has Brilinta in stock.   PCP - [name] MD   Expected Discharge Date:  07/31/2016               Expected Discharge Plan:  Home/Self Care  In-House Referral:  NA  Discharge planning Services  CM Consult, Medication Assistance  Post Acute Care Choice:  NA Choice offered to:  NA  DME Arranged:  N/A DME Agency:  NA  HH Arranged:  NA HH Agency:  NA  Status of Service:  In process, will continue to follow  If discussed at Long Length of Stay Meetings, dates discussed:    Additional Comments:  Erenest Rasher, RN 07/30/2016, 5:05 PM

## 2016-07-30 NOTE — Progress Notes (Signed)
MEDICATION RELATED CONSULT NOTE - INITIAL   Pharmacy Consult for sliding scale insulin management    Allergies  Allergen Reactions  . Levemir [Insulin Detemir] Swelling    Patient had redness and swelling and tenderness at injection site.  . Codeine Other (See Comments)    Makes him "shakey", is OK with hydrocodone GI UPSET & TREMORS  . Lipitor [Atorvastatin] Other (See Comments)    Muscle aches; liver functions. Patient is currently taken  . Novolog Mix [Insulin Aspart Prot & Aspart]     Causes skin to swell/itch at injection site    Vital Signs: Temp: 97.5 F (36.4 C) (10/19 1650) Temp Source: Oral (10/19 1650) BP: 138/47 (10/19 1650) Pulse Rate: 75 (10/19 1650) Intake/Output from previous day: No intake/output data recorded. Intake/Output from this shift: Total I/O In: -  Out: 750 [Urine:750]   Medications:  Prescriptions Prior to Admission  Medication Sig Dispense Refill Last Dose  . acetaminophen (TYLENOL) 500 MG tablet Take 1,000 mg by mouth 2 (two) times daily.   07/30/2016 at Unknown time  . allopurinol (ZYLOPRIM) 300 MG tablet TAKE 1 TABLET EVERY DAY 90 tablet 1 07/30/2016 at Unknown time  . aspirin EC 81 MG tablet Take 81 mg by mouth daily.   07/30/2016 at Unknown time  . atorvastatin (LIPITOR) 40 MG tablet Take 1 tablet (40 mg total) by mouth daily. 90 tablet 0 07/30/2016 at Unknown time  . B Complex Vitamins (B COMPLEX-B12 PO) Take 1 tablet by mouth at bedtime.   07/29/2016 at Unknown time  . Cholecalciferol (VITAMIN D) 1000 UNITS capsule Take 1,000 Units by mouth at bedtime.    07/29/2016 at Unknown time  . cyclobenzaprine (FLEXERIL) 5 MG tablet Take 1 tablet (5 mg total) by mouth 3 (three) times daily as needed for muscle spasms. (Patient taking differently: Take 5 mg by mouth 2 (two) times daily as needed for muscle spasms. ) 30 tablet 1 07/30/2016 at Unknown time  . fenofibrate 160 MG tablet TAKE 1 TABLET EVERY DAY (Patient taking differently: TAKE 1 TABLET  EVERY night) 90 tablet 1 07/29/2016 at Unknown time  . fluocinonide cream (LIDEX) [id number] % Apply 1 application topically daily as needed. FOR RASH   Past Month at Unknown time  . furosemide (LASIX) 80 MG tablet TAKE 1 TABLET EVERY DAY (Patient taking differently: TAKE 1/2 TABLET EVERY DAY) 90 tablet 1 07/29/2016 at Unknown time  . insulin NPH Human (NOVOLIN N) 100 UNIT/ML injection Inject 40 units in am and 35 units in pm (Patient taking differently: Inject 40 Units into the skin 2 (two) times daily before a meal. ) 70 mL 1 07/30/2016 at Unknown time  . insulin regular (NOVOLIN R RELION) 100 units/mL injection Inject 0.25-0.3 mLs (25-30 Units total) into the skin 3 (three) times daily before meals. (Patient taking differently: Inject 30 Units into the skin 3 (three) times daily before meals. ) 70 mL 1 07/30/2016 at Unknown time  . isosorbide mononitrate (IMDUR) 30 MG 24 hr tablet TAKE 1 TABLET EVERY DAY 90 tablet 3 07/30/2016 at Unknown time  . metoprolol (LOPRESSOR) 50 MG tablet TAKE 1 TABLET TWICE DAILY 180 tablet 1 07/30/2016 at 0730  . nitroGLYCERIN (NITROSTAT) 0.4 MG SL tablet Place 1 tablet (0.4 mg total) under the tongue every 5 (five) minutes as needed for chest pain. 25 tablet 3 07/30/2016 at Unknown time  . pantoprazole (PROTONIX) 20 MG tablet Take 1 tablet (20 mg total) by mouth daily. 90 tablet 3 07/30/2016 at Unknown time  .  tamsulosin (FLOMAX) 0.4 MG CAPS capsule TAKE 1 CAPSULE EVERY DAY 90 capsule 1 07/30/2016 at Unknown time  . albuterol (PROVENTIL HFA;VENTOLIN HFA) 108 (90 BASE) MCG/ACT inhaler Inhale 2 puffs into the lungs every 6 (six) hours as needed for wheezing or shortness of breath. 18 Inhaler 2 rescue  . fluticasone (FLONASE) 50 MCG/ACT nasal spray Place 2 sprays into both nostrils daily. (Patient taking differently: Place 2 sprays into both nostrils as needed. ) 16 g 1 Taking  . HYDROcodone-acetaminophen (NORCO) 5-325 MG tablet Take 1 tablet by mouth every 6 (six) hours as needed  for moderate pain. (Patient not taking: Reported on 07/30/2016) 30 tablet 0 Not Taking at Unknown time    Assessment: Pt is intolerant to Novolog   Plan:  Entered in order for sliding scale using insulin lispro    Jadin Creque, Jake Church 07/30/2016,6:38 PM

## 2016-07-30 NOTE — ED Notes (Signed)
Cardiology at bedside.

## 2016-07-30 NOTE — ED Notes (Signed)
Cath lab called and is ready for the patient in lab 1 rn will take patient

## 2016-07-30 NOTE — ED Triage Notes (Signed)
Pt reports walking around in [employer] to the restroom and had a sudden onset of substernal chest pain with sob. Pt took 3 sl nitro tablets that has eases the pain to 8/10. Pt states prior to nitro tablets was 10/10. Pt is warn and dry.

## 2016-07-31 ENCOUNTER — Encounter (HOSPITAL_COMMUNITY): Payer: Self-pay | Admitting: Internal Medicine

## 2016-07-31 DIAGNOSIS — N183 Chronic kidney disease, stage 3 unspecified: Secondary | ICD-10-CM

## 2016-07-31 DIAGNOSIS — I2510 Atherosclerotic heart disease of native coronary artery without angina pectoris: Secondary | ICD-10-CM

## 2016-07-31 DIAGNOSIS — I10 Essential (primary) hypertension: Secondary | ICD-10-CM | POA: Diagnosis not present

## 2016-07-31 DIAGNOSIS — E782 Mixed hyperlipidemia: Secondary | ICD-10-CM

## 2016-07-31 DIAGNOSIS — I214 Non-ST elevation (NSTEMI) myocardial infarction: Secondary | ICD-10-CM

## 2016-07-31 DIAGNOSIS — I25700 Atherosclerosis of coronary artery bypass graft(s), unspecified, with unstable angina pectoris: Secondary | ICD-10-CM | POA: Diagnosis not present

## 2016-07-31 LAB — CBC
HCT: 40.9 % (ref 39.0–52.0)
Hemoglobin: 13.6 g/dL (ref 13.0–17.0)
MCH: 30.6 pg (ref 26.0–34.0)
MCHC: 33.3 g/dL (ref 30.0–36.0)
MCV: 91.9 fL (ref 78.0–100.0)
PLATELETS: 149 10*3/uL — AB (ref 150–400)
RBC: 4.45 MIL/uL (ref 4.22–5.81)
RDW: 13.9 % (ref 11.5–15.5)
WBC: 9 10*3/uL (ref 4.0–10.5)

## 2016-07-31 LAB — BASIC METABOLIC PANEL
Anion gap: 8 (ref 5–15)
BUN: 23 mg/dL — ABNORMAL HIGH (ref 6–20)
CALCIUM: 9.7 mg/dL (ref 8.9–10.3)
CHLORIDE: 103 mmol/L (ref 101–111)
CO2: 27 mmol/L (ref 22–32)
CREATININE: 1.33 mg/dL — AB (ref 0.61–1.24)
GFR calc Af Amer: 58 mL/min — ABNORMAL LOW (ref >60)
GFR calc non Af Amer: 50 mL/min — ABNORMAL LOW (ref >60)
GLUCOSE: 193 mg/dL — AB (ref 65–99)
Potassium: 3.7 mmol/L (ref 3.5–5.1)
Sodium: 138 mmol/L (ref 135–145)

## 2016-07-31 LAB — TROPONIN I: TROPONIN I: 0.17 ng/mL — AB (ref <0.03)

## 2016-07-31 LAB — GLUCOSE, CAPILLARY
GLUCOSE-CAPILLARY: 174 mg/dL — AB (ref 65–99)
GLUCOSE-CAPILLARY: 166 mg/dL — AB (ref 65–99)

## 2016-07-31 MED ORDER — FUROSEMIDE 20 MG PO TABS: 20.0000 mg | ORAL_TABLET | Freq: Every day | ORAL | Status: DC

## 2016-07-31 MED ORDER — FUROSEMIDE 40 MG PO TABS: 40.0000 mg | ORAL_TABLET | Freq: Every day | ORAL | Status: DC

## 2016-07-31 MED ORDER — FUROSEMIDE 80 MG PO TABS: 40.0000 mg | ORAL_TABLET | Freq: Every day | ORAL | 1 refills | Status: DC

## 2016-07-31 MED ORDER — TICAGRELOR 90 MG PO TABS: 90.0000 mg | ORAL_TABLET | Freq: Two times a day (BID) | ORAL | 3 refills | Status: DC

## 2016-07-31 NOTE — Progress Notes (Signed)
Patient Name: William Hendrix Date of Encounter: 07/31/2016  Primary Cardiologist: [facility] Problem List     Principal Problem:   NSTEMI (non-ST elevated myocardial infarction) [facility]) Active Problems:   Hyperlipidemia   Essential hypertension   CAD in native artery   Obesity   CKD (chronic kidney disease), stage III    Subjective   Feeling well - no CP or SOB. Mild nasal congestion - recently had allergies after cutting the grass. No fever.  Inpatient Medications    . allopurinol  300 mg Oral Daily  . aspirin EC  81 mg Oral Daily  . atorvastatin  40 mg Oral Daily  . enoxaparin (LOVENOX) injection  40 mg Subcutaneous Q24H  . fenofibrate  160 mg Oral QHS  . furosemide  40 mg Oral Daily  . insulin lispro  0-15 Units Subcutaneous TID WC  . insulin NPH Human  35 Units Subcutaneous QHS  . insulin NPH Human  40 Units Subcutaneous QAC breakfast  . isosorbide mononitrate  30 mg Oral Daily  . metoprolol  50 mg Oral BID  . pantoprazole  20 mg Oral Daily  . sodium chloride flush  3 mL Intravenous Q12H  . tamsulosin  0.4 mg Oral Daily  . ticagrelor  90 mg Oral BID    Vital Signs    Vitals:   07/30/16 1650 07/30/16 1912 07/30/16 2100 07/31/16 0405  BP: (!) 138/47 (!) 151/75  (!) 135/53  Pulse: 75 81  84  Resp: 19 (!) 22 (!) 23 (!) 23  Temp: 97.5 F (36.4 C) 97.7 F (36.5 C)  97.3 F (36.3 C)  TempSrc: Oral Oral  Oral  SpO2: 98% 98%  97%  Weight:    268 lb 4.8 oz (121.7 kg)  Height:        Intake/Output Summary (Last 24 hours) at 07/31/16 0804 Last data filed at 07/31/16 0634  Gross per 24 hour  Intake              990 ml  Output             1900 ml  Net             -910 ml   Filed Weights   07/30/16 1059 07/31/16 0405  Weight: 260 lb (117.9 kg) 268 lb 4.8 oz (121.7 kg)    Physical Exam    General: Well developed, well nourished WM, in no acute distress. HEENT: Normocephalic, atraumatic, sclera non-icteric, no xanthomas, nares are without  discharge. Neck: Negative for carotid bruits. JVP not elevated. Lungs: Clear bilaterally to auscultation without wheezes, rales, or rhonchi. Breathing is unlabored. Cardiac: RRR S1 S2 without murmurs, rubs, or gallops.  Abdomen: Soft, non-tender, non-distended with normoactive bowel sounds. No rebound/guarding. Extremities: No clubbing or cyanosis. No edema. Distal pedal pulses are 2+ and equal bilaterally. Skin: Warm and dry, no significant rash. Right radial cath site without hematoma or ecchymosis; good pulse. Neuro: Alert and oriented X 3. Strength and sensation in tact. Psych:  Responds to questions appropriately with a normal affect.  Labs    CBC  Recent Labs  07/30/16 1100 07/31/16 0037  WBC 8.5 9.0  HGB 14.8 13.6  HCT 43.2 40.9  MCV 91.5 91.9  PLT 168 123456*   Basic Metabolic Panel  Recent Labs  07/30/16 1100 07/31/16 0037  NA 140 138  K 3.9 3.7  CL 104 103  CO2 27 27  GLUCOSE 164* 193*  BUN 28* 23*  CREATININE 1.37*  1.33*  CALCIUM 10.0 9.7   Liver Function Tests No results for input(s): AST, ALT, ALKPHOS, BILITOT, PROT, ALBUMIN in the last 72 hours. No results for input(s): LIPASE, AMYLASE in the last 72 hours. Cardiac Enzymes  Recent Labs  07/30/16 1242 07/30/16 1936 07/31/16 0037  TROPONINI 0.04* 0.08* 0.17*   BNP Invalid input(s): POCBNP D-Dimer No results for input(s): DDIMER in the last 72 hours. Hemoglobin A1C No results for input(s): HGBA1C in the last 72 hours. Fasting Lipid Panel No results for input(s): CHOL, HDL, LDLCALC, TRIG, CHOLHDL, LDLDIRECT in the last 72 hours. Thyroid Function Tests No results for input(s): TSH, T4TOTAL, T3FREE, THYROIDAB in the last 72 hours.  Invalid input(s): FREET3  Telemetry    NSR rare PVC  Radiology    Dg Chest 2 View  Result Date: 07/30/2016 CLINICAL DATA:  Chest  Pain since  This  am EXAM: CHEST  2 VIEW COMPARISON:  05/18/2013 FINDINGS: Heart size is upper limits normal. Coronary stent is  visible. The lungs are free of focal consolidations and pleural effusions. No pulmonary edema. Mid thoracic spondylosis noted. IMPRESSION: No active cardiopulmonary disease. Electronically Signed   By: Nolon Nations M.D.   On: 07/30/2016 11:27    Patient Profile     48M with CAD (stent to LAD 2000, possible spasm by cath 2001, IVUS/PTCA/DES to [place] 05/2013, PTCA/DES of dRCA into ostial rPDA 07/2013), arthritis, CKD stage III, DM, GERD, HTN, hyperlipidemia, obesity, sleep apnea, mild aneurysmal dilation of infrarenal abdominal aorta (3.2cm in 12/2014 with recommended f/u 2019) admitted with chest pain and mild NSTEMI. LHC 07/31/16 with PTCA to OM2, DES to mLCx, and DES to mRCA, upper normal to mildly elevated LV filling pressures.  Assessment & Plan    1. CAD/NSTEMI - stable post PCI. Continue ASA, BB, statin, Brilinta, Imdur. He had myalgias with higher dose statin so will continue current dose. Cardiac rehab to see. He declined participation in [place].  2. Chronic diastolic CHF - stable. Patient states he's actually on 20mg  Lasix. Will resume in AM given recent cath and CKD.  3. CKD stage III - Cr stable post-cath. Baseline appears 1.3-1.6.  4. HTN - controlled.  Signed, Charlie Pitter, PA-C  07/31/2016, 8:04 AM   I have examined the patient and reviewed assessment and plan and discussed with patient.  Agree with above as stated.  Cr stable.  S/p 2 vessel PCI.  Stressed importance of DAPT.  Encouraged cardiac rehab.  2+ right radial pulse.  No hematoma.  Plan discharge later today.  Larae Grooms

## 2016-07-31 NOTE — Discharge Summary (Signed)
Discharge Summary    Patient ID: William Hendrix,  MRN: [id number], DOB/AGE: [birth year] 75 y.o.  Admit date: 07/30/2016 Discharge date: 07/31/2016  Primary Care Provider: Penni Homans Primary Cardiologist: Dr. Stanford Breed  Discharge Diagnoses    Principal Problem:   NSTEMI (non-ST elevated myocardial infarction) [facility]) Active Problems:   Hyperlipidemia   Essential hypertension   CAD in native artery   Obesity   CKD (chronic kidney disease), stage III   Diagnostic Studies/Procedures    1. Cardiac catheterization this admission, please see full report and below for summary. _____________   History of Present Illness & Hospital Course    [phone] with CAD (stent to LAD 2000, possible spasm by cath 2001, IVUS/PTCA/DES to [place] 05/2013, PTCA/DES of dRCA into ostial rPDA 07/2013), arthritis, CKD stage III, DM, GERD, HTN, hyperlipidemia, obesity, sleep apnea, mild aneurysmal dilation of infrarenal abdominal aorta (3.2cm in 12/2014 with recommended f/u 2019) admitted with chest pain and mild NSTEMI with peak troponin of 0.17. He was admitted and underwent LHC 07/31/16 with PTCA to OM2, DES to mLCx, and DES to [facility], upper normal to mildly elevated LV filling pressures, LV gram not performed due to CKD. He tolerated this procedure well. He had myalgias with higher dose statin so will continue current dose of 40mg . He declined participation in [place] so received 30 day free rx of Brilinta then standard refills. 2D echo will be considered as outpatient. F/u Cr this AM was 1.33 (baseline appears 1.3-1.6). We will resume Lasix tomorrow - note he said he's actually taking 1/2 tablet a day. Dr. Irish Lack has seen and examined the patient today and feels he is stable for discharge.  I have sent a message to our [facility] office's scheduler requesting a follow-up appointment, and our office will call the patient with this information - the patient requested f/u in the [place] location. I am not sure this  will be available so early as the required 1 week TOC, and the patient is aware. _____________  Discharge Vitals Blood pressure (!) 154/67, pulse 85, temperature 97.4 F (36.3 C), temperature source Oral, resp. rate 20, height 5\' 10"  (1.778 m), weight 268 lb 4.8 oz (121.7 kg), SpO2 96 %.  Filed Weights   07/30/16 1059 07/31/16 0405  Weight: 260 lb (117.9 kg) 268 lb 4.8 oz (121.7 kg)    Labs & Radiologic Studies    CBC  Recent Labs  07/30/16 1100 07/31/16 0037  WBC 8.5 9.0  HGB 14.8 13.6  HCT 43.2 40.9  MCV 91.5 91.9  PLT 168 123456*   Basic Metabolic Panel  Recent Labs  07/30/16 1100 07/31/16 0037  NA 140 138  K 3.9 3.7  CL 104 103  CO2 27 27  GLUCOSE 164* 193*  BUN 28* 23*  CREATININE 1.37* 1.33*  CALCIUM 10.0 9.7   Cardiac Enzymes  Recent Labs  07/30/16 1242 07/30/16 1936 07/31/16 0037  TROPONINI 0.04* 0.08* 0.17*   _____________  Dg Chest 2 View  Result Date: 07/30/2016 CLINICAL DATA:  Chest  Pain since  This  am EXAM: CHEST  2 VIEW COMPARISON:  05/18/2013 FINDINGS: Heart size is upper limits normal. Coronary stent is visible. The lungs are free of focal consolidations and pleural effusions. No pulmonary edema. Mid thoracic spondylosis noted. IMPRESSION: No active cardiopulmonary disease. Electronically Signed   By: Nolon Nations M.D.   On: 07/30/2016 11:27   Disposition   Pt is being discharged home today in good condition.  Follow-up Plans & Appointments  Follow-up Information    Kirk Ruths, MD .   Specialty:  Cardiology Why:  Dr. Jacalyn Lefevre office will call you with your follow-up - since you need to be seen in 1 week this may be at a different location instead of [place] since those appointments are usually further out. Contact information: [address] [place] [address] [phone]          Discharge Instructions    Amb Referral to Cardiac Rehabilitation    Complete by:  As directed    Diagnosis:    PTCA Coronary Stents     Diet - low sodium heart healthy    Complete by:  As directed    Increase activity slowly    Complete by:  As directed    No driving for 1 week. No lifting over 10 lbs for 2 weeks. No sexual activity for 2 weeks. Keep procedure site clean & dry. If you notice increased pain, swelling, bleeding or pus, call/return!  You may shower, but no soaking baths/hot tubs/pools for 1 week.   Restart your furosemide (Lasix - fluid pill) tomorrow.      Discharge Medications     Medication List    STOP taking these medications   HYDROcodone-acetaminophen 5-325 MG tablet - not taking prior to admission. Commonly known as:  NORCO     TAKE these medications   acetaminophen 500 MG tablet Commonly known as:  TYLENOL Take 1,000 mg by mouth 2 (two) times daily.   albuterol 108 (90 Base) MCG/ACT inhaler Commonly known as:  PROVENTIL HFA;VENTOLIN HFA Inhale 2 puffs into the lungs every 6 (six) hours as needed for wheezing or shortness of breath.   allopurinol 300 MG tablet Commonly known as:  ZYLOPRIM TAKE 1 TABLET EVERY DAY   aspirin EC 81 MG tablet Take 81 mg by mouth daily.   atorvastatin 40 MG tablet Commonly known as:  LIPITOR Take 1 tablet (40 mg total) by mouth daily.   B COMPLEX-B12 PO Take 1 tablet by mouth at bedtime.   cyclobenzaprine 5 MG tablet Commonly known as:  FLEXERIL Take 1 tablet (5 mg total) by mouth 3 (three) times daily as needed for muscle spasms. What changed:  when to take this   fenofibrate 160 MG tablet TAKE 1 TABLET EVERY DAY What changed:  See the new instructions.   fluocinonide cream 0.05 % Commonly known as:  LIDEX Apply 1 application topically daily as needed. FOR RASH   fluticasone 50 MCG/ACT nasal spray Commonly known as:  FLONASE Place 2 sprays into both nostrils daily. What changed:  when to take this  reasons to take this   furosemide 80 MG tablet Commonly known as:  LASIX Take 0.5 tablets (40 mg total) by  mouth daily. Start taking on:  08/01/2016 What changed:  See the new instructions.   insulin NPH Human 100 UNIT/ML injection Commonly known as:  NOVOLIN N Inject 40 units in am and 35 units in pm What changed:  how much to take  how to take this  when to take this  additional instructions   insulin regular 100 units/mL injection Commonly known as:  NOVOLIN R RELION Inject 0.25-0.3 mLs (25-30 Units total) into the skin 3 (three) times daily before meals. What changed:  how much to take   isosorbide mononitrate 30 MG 24 hr tablet Commonly known as:  IMDUR TAKE 1 TABLET EVERY DAY   metoprolol 50 MG tablet Commonly known as:  LOPRESSOR TAKE 1 TABLET  TWICE DAILY   nitroGLYCERIN 0.4 MG SL tablet Commonly known as:  NITROSTAT Place 1 tablet (0.4 mg total) under the tongue every 5 (five) minutes as needed for chest pain.   pantoprazole 20 MG tablet Commonly known as:  PROTONIX Take 1 tablet (20 mg total) by mouth daily.   tamsulosin 0.4 MG Caps capsule Commonly known as:  FLOMAX TAKE 1 CAPSULE EVERY DAY   ticagrelor 90 MG Tabs tablet Commonly known as:  BRILINTA Take 1 tablet (90 mg total) by mouth 2 (two) times daily.   Vitamin D 1000 units capsule Take 1,000 Units by mouth at bedtime.        Allergies:  Allergies  Allergen Reactions  . Levemir [Insulin Detemir] Swelling    Patient had redness and swelling and tenderness at injection site.  . Codeine Other (See Comments)    Makes him "shakey", is OK with hydrocodone GI UPSET & TREMORS  . Lipitor [Atorvastatin] Other (See Comments)    Muscle aches; liver functions. Patient is currently taken  . Novolog Mix [Insulin Aspart Prot & Aspart]     Causes skin to swell/itch at injection site    Aspirin prescribed at discharge?  Yes High Intensity Statin Prescribed? (Lipitor 40-80mg  or Crestor 20-40mg ): Yes Beta Blocker Prescribed? Yes For EF <40%, was ACEI/ARB Prescribed? No: renal insufficiency ADP Receptor  Inhibitor Prescribed? (i.e. Plavix etc.-Includes Medically Managed Patients): Yes For EF <40%, Aldosterone Inhibitor Prescribed? No: renal insufficiency  Was EF assessed during THIS hospitalization? No: will plan to consider echo as outpatient Was Cardiac Rehab II ordered? (Included Medically managed Patients): Yes   Outstanding Labs/Studies   NA  Duration of Discharge Encounter   Greater than 30 minutes including physician time.  Signed, Nedra Hai Dunn PA-C 07/31/2016, 9:52 AM   I have examined the patient and reviewed assessment and plan and discussed with patient.  Agree with above as stated.  S/p 2 vessel PCI.  Stressed importance of DAPT.  Right radial site is stable.  No hematoma.  Pulse present.  Continue aggressive secondary prevention.  F/u with Dr. Stanford Breed.  Larae Grooms

## 2016-07-31 NOTE — Progress Notes (Signed)
CARDIAC REHAB PHASE I   PRE:  Rate/Rhythm: 94 SR  BP:  Sitting: 135/60        SaO2: 95 RA  MODE:  Ambulation: 300 ft   POST:  Rate/Rhythm: 97 SR  BP:  Sitting: 134/62        SaO2: 96 RA  Pt ambulated 35ft w/o assistance but had to stop several times to rest.  Pt had some dyspnea w/ exercise and c/o of bilateral hip pain.  Reviewed stent booklet and discussed the importance of taking Brilinta/ASA and NTG(as needed).  Discussed diet, DM and the importance of exercise.  Will refer to [place]. [place], [place] 07/31/2016 9:30 AM

## 2016-08-01 ENCOUNTER — Emergency Department (HOSPITAL_BASED_OUTPATIENT_CLINIC_OR_DEPARTMENT_OTHER): Payer: Commercial Managed Care - HMO

## 2016-08-01 ENCOUNTER — Inpatient Hospital Stay (HOSPITAL_BASED_OUTPATIENT_CLINIC_OR_DEPARTMENT_OTHER)
Admission: EM | Admit: 2016-08-01 | Discharge: 2016-08-04 | DRG: 247 | Disposition: A | Discharge status: Home / Self Care | Payer: Commercial Managed Care - HMO | Attending: Internal Medicine | Admitting: Internal Medicine

## 2016-08-01 ENCOUNTER — Encounter (HOSPITAL_BASED_OUTPATIENT_CLINIC_OR_DEPARTMENT_OTHER): Payer: Self-pay | Admitting: Respiratory Therapy

## 2016-08-01 DIAGNOSIS — E1165 Type 2 diabetes mellitus with hyperglycemia: Secondary | ICD-10-CM | POA: Diagnosis present

## 2016-08-01 DIAGNOSIS — I25118 Atherosclerotic heart disease of native coronary artery with other forms of angina pectoris: Secondary | ICD-10-CM | POA: Diagnosis present

## 2016-08-01 DIAGNOSIS — E669 Obesity, unspecified: Secondary | ICD-10-CM | POA: Diagnosis present

## 2016-08-01 DIAGNOSIS — G4733 Obstructive sleep apnea (adult) (pediatric): Secondary | ICD-10-CM | POA: Diagnosis present

## 2016-08-01 DIAGNOSIS — E1159 Type 2 diabetes mellitus with other circulatory complications: Secondary | ICD-10-CM | POA: Diagnosis present

## 2016-08-01 DIAGNOSIS — N183 Chronic kidney disease, stage 3 unspecified: Secondary | ICD-10-CM | POA: Diagnosis present

## 2016-08-01 DIAGNOSIS — Z79899 Other long term (current) drug therapy: Secondary | ICD-10-CM

## 2016-08-01 DIAGNOSIS — R0602 Shortness of breath: Secondary | ICD-10-CM | POA: Diagnosis present

## 2016-08-01 DIAGNOSIS — L8990 Pressure ulcer of unspecified site, unspecified stage: Secondary | ICD-10-CM | POA: Insufficient documentation

## 2016-08-01 DIAGNOSIS — I130 Hypertensive heart and chronic kidney disease with heart failure and stage 1 through stage 4 chronic kidney disease, or unspecified chronic kidney disease: Secondary | ICD-10-CM | POA: Diagnosis present

## 2016-08-01 DIAGNOSIS — E781 Pure hyperglyceridemia: Secondary | ICD-10-CM | POA: Diagnosis present

## 2016-08-01 DIAGNOSIS — Z23 Encounter for immunization: Secondary | ICD-10-CM

## 2016-08-01 DIAGNOSIS — K219 Gastro-esophageal reflux disease without esophagitis: Secondary | ICD-10-CM | POA: Diagnosis present

## 2016-08-01 DIAGNOSIS — Z87891 Personal history of nicotine dependence: Secondary | ICD-10-CM

## 2016-08-01 DIAGNOSIS — I2510 Atherosclerotic heart disease of native coronary artery without angina pectoris: Secondary | ICD-10-CM | POA: Diagnosis present

## 2016-08-01 DIAGNOSIS — E118 Type 2 diabetes mellitus with unspecified complications: Secondary | ICD-10-CM

## 2016-08-01 DIAGNOSIS — R0600 Dyspnea, unspecified: Secondary | ICD-10-CM | POA: Diagnosis present

## 2016-08-01 DIAGNOSIS — R079 Chest pain, unspecified: Principal | ICD-10-CM

## 2016-08-01 DIAGNOSIS — Z7902 Long term (current) use of antithrombotics/antiplatelets: Secondary | ICD-10-CM

## 2016-08-01 DIAGNOSIS — Z794 Long term (current) use of insulin: Secondary | ICD-10-CM

## 2016-08-01 DIAGNOSIS — R0789 Other chest pain: Secondary | ICD-10-CM

## 2016-08-01 DIAGNOSIS — I714 Abdominal aortic aneurysm, without rupture: Secondary | ICD-10-CM | POA: Diagnosis present

## 2016-08-01 DIAGNOSIS — M109 Gout, unspecified: Secondary | ICD-10-CM | POA: Diagnosis present

## 2016-08-01 DIAGNOSIS — Z6836 Body mass index (BMI) 36.0-36.9, adult: Secondary | ICD-10-CM

## 2016-08-01 DIAGNOSIS — B349 Viral infection, unspecified: Secondary | ICD-10-CM | POA: Diagnosis present

## 2016-08-01 DIAGNOSIS — I10 Essential (primary) hypertension: Secondary | ICD-10-CM | POA: Diagnosis present

## 2016-08-01 DIAGNOSIS — E1122 Type 2 diabetes mellitus with diabetic chronic kidney disease: Secondary | ICD-10-CM | POA: Diagnosis present

## 2016-08-01 DIAGNOSIS — E1142 Type 2 diabetes mellitus with diabetic polyneuropathy: Secondary | ICD-10-CM | POA: Diagnosis present

## 2016-08-01 DIAGNOSIS — Z955 Presence of coronary angioplasty implant and graft: Secondary | ICD-10-CM

## 2016-08-01 DIAGNOSIS — M545 Low back pain: Secondary | ICD-10-CM | POA: Diagnosis present

## 2016-08-01 DIAGNOSIS — G8929 Other chronic pain: Secondary | ICD-10-CM | POA: Diagnosis present

## 2016-08-01 DIAGNOSIS — Z7982 Long term (current) use of aspirin: Secondary | ICD-10-CM

## 2016-08-01 DIAGNOSIS — N289 Disorder of kidney and ureter, unspecified: Secondary | ICD-10-CM

## 2016-08-01 DIAGNOSIS — I5032 Chronic diastolic (congestive) heart failure: Secondary | ICD-10-CM | POA: Diagnosis present

## 2016-08-01 LAB — CBC WITH DIFFERENTIAL/PLATELET
BASOS ABS: 0 10*3/uL (ref 0.0–0.1)
BASOS PCT: 0 %
EOS ABS: 0.2 10*3/uL (ref 0.0–0.7)
EOS PCT: 2 %
HCT: 40.6 % (ref 39.0–52.0)
Hemoglobin: 13.9 g/dL (ref 13.0–17.0)
LYMPHS PCT: 29 %
Lymphs Abs: 3 10*3/uL (ref 0.7–4.0)
MCH: 32 pg (ref 26.0–34.0)
MCHC: 34.2 g/dL (ref 30.0–36.0)
MCV: 93.3 fL (ref 78.0–100.0)
MONO ABS: 0.9 10*3/uL (ref 0.1–1.0)
Monocytes Relative: 9 %
Neutro Abs: 6 10*3/uL (ref 1.7–7.7)
Neutrophils Relative %: 60 %
PLATELETS: 177 10*3/uL (ref 150–400)
RBC: 4.35 MIL/uL (ref 4.22–5.81)
RDW: 14.1 % (ref 11.5–15.5)
WBC: 10.1 10*3/uL (ref 4.0–10.5)

## 2016-08-01 LAB — BASIC METABOLIC PANEL
Anion gap: 10 (ref 5–15)
BUN: 28 mg/dL — AB (ref 6–20)
CALCIUM: 9.9 mg/dL (ref 8.9–10.3)
CO2: 23 mmol/L (ref 22–32)
CREATININE: 1.78 mg/dL — AB (ref 0.61–1.24)
Chloride: 106 mmol/L (ref 101–111)
GFR calc Af Amer: 41 mL/min — ABNORMAL LOW (ref >60)
GFR, EST NON AFRICAN AMERICAN: 35 mL/min — AB (ref >60)
GLUCOSE: 138 mg/dL — AB (ref 65–99)
Potassium: 3.8 mmol/L (ref 3.5–5.1)
Sodium: 139 mmol/L (ref 135–145)

## 2016-08-01 LAB — TROPONIN I: TROPONIN I: 0.1 ng/mL — AB (ref <0.03)

## 2016-08-01 NOTE — ED Triage Notes (Signed)
Pt in c/o chest pain and SOB since his stent placement this week but gradually getting worse after coming home from hospital. Pt is alert, interactive, in NAD. Pt appears SOB.

## 2016-08-01 NOTE — ED Provider Notes (Addendum)
[facility] DEPT [facility] Provider Note   CSN: [id number] Arrival date & time: 08/01/16  2119   By signing my name below, I, Neta Mends, attest that this documentation has been prepared under the direction and in the presence of Davonna Belling, MD . Electronically Signed: Neta Mends, ED Scribe. 08/01/2016. 11:35 PM.   History   Chief Complaint Chief Complaint  Patient presents with  . Chest Pain    The history is provided by the patient. No language interpreter was used.   HPI Comments:  TALAN GRUSS is a 75 y.o. male with PMHx of AAA, CAD, CHF, CKD and DM who presents to the Emergency Department complaining of sudden onset, worsening chest pain x 5 days. Pt states that he was at [employer], walked all the way across the store to go to the bathroom, and then began having chest pain when he got to the bathroom. Pt has had 3 coronary stents placed and had 1 placed this week. Pt complains of an associated cough and SOB. No alleviating factors noted. Pt denies other associated symptoms.   Past Medical History:  Diagnosis Date  . AAA (abdominal aortic aneurysm) ([place]) 12/15/2014   a. Mild aneurysmal dilatation of the infrarenal abdominal aorta 3.2 cm - f/u due by 2019.  . Arthritis    "all over" (07/30/2016)  . CAD (coronary artery disease)    a. stent to LAD 2000. b. possible spasm by cath 2001. c. IVUS/PTCA/DES to mLAD 05/2013. d. PTCA/DES of dRCA into ostial rPDA 07/2013. e. PTCA of OM2, DES to Cx, DES to [facility] 07/2016.  Marland Kitchen Chronic bronchitis ([place])   . Chronic diastolic CHF (congestive heart failure) ([place])   . Chronic lower back pain   . Chronic neck pain   . CKD (chronic kidney disease), stage III   . Diabetic peripheral neuropathy ([place])   . Ejection fraction    55%, 07/2010, mild inferior hypo  . GERD (gastroesophageal reflux disease)   . Gout   . H/O diverticulitis of colon 01/31/2015  . H/O hiatal hernia   . History of blood transfusion ~ 10/1940   "had  pneumonia"  . HTN (hypertension)   . Hyperlipidemia   . Melanoma of forearm, left ([place]) 02/2011   with wide excision   . Nephrolithiasis   . Obesity   . OSA on CPAP     [name] since 2000  . Positive D-dimer    a. significant elevation ,hospital 07/2010, etiology unclear. b. D Dimer chronically > 20.  . Skin cancer   . Spinal stenosis    a. s/p surgical repair 2013  . Type II diabetes mellitus ([place])   . Ventral hernia     Patient Active Problem List   Diagnosis Date Noted  . CKD (chronic kidney disease), stage III 07/31/2016  . NSTEMI (non-ST elevated myocardial infarction) ([place]) 07/30/2016  . CAD in native artery   . Obesity   . Uncontrolled type 2 diabetes mellitus with circulatory disorder, with long-term current use of insulin ([place]) 11/28/2015  . Lumbar radiculopathy 10/09/2015  . Nail bed injury 05/22/2015  . Neuropathy, diabetic ([place]) 05/22/2015  . H/O diverticulitis of colon 01/31/2015  . AAA (abdominal aortic aneurysm) ([place]) 01/01/2015  . Sinusitis 10/10/2014  . Hip pain, bilateral 07/05/2014  . Palpitations 02/27/2014  . Claudication of lower extremity ([place]) 10/25/2013  . BPH with obstruction/lower urinary tract symptoms 10/25/2013  . Hypertriglyceridemia 08/15/2012  . Melanoma ([place])   . Dizziness   .  Renal insufficiency   . CAD (coronary artery disease)   . Hyperlipidemia   . Essential hypertension   . OSA (obstructive sleep apnea)   . Ejection fraction   . SOB (shortness of breath)   . PARESTHESIA 05/30/2009  . Overweight 01/22/2009  . EDEMA 01/22/2009  . OTHER ABNORMAL BLOOD CHEMISTRY 04/25/2008  . Gout 10/21/2007  . DEPRESSION 10/21/2007  . VENTRAL HERNIA 10/21/2007  . HIATAL HERNIA 10/21/2007  . FATIGUE 10/21/2007  . SKIN CANCER, HX OF 10/21/2007  . NEPHROLITHIASIS, HX OF 10/21/2007    Past Surgical History:  Procedure Laterality Date  . BACK SURGERY    . CARDIAC CATHETERIZATION N/A 07/30/2016   Procedure: Left Heart Cath and Coronary  Angiography;  Surgeon: Nelva Bush, MD;  Location: [place] CV LAB;  Service: Cardiovascular;  Laterality: N/A;  . CARDIAC CATHETERIZATION N/A 07/30/2016   Procedure: Coronary Stent Intervention;  Surgeon: Nelva Bush, MD;  Location: [place] CV LAB;  Service: Cardiovascular;  Laterality: N/A;  Mid CFX and MID RCA  . CARDIAC CATHETERIZATION N/A 07/30/2016   Procedure: Coronary Balloon Angioplasty;  Surgeon: Nelva Bush, MD;  Location: [place] CV LAB;  Service: Cardiovascular;  Laterality: N/A;  OM 1  . CARPAL TUNNEL RELEASE Left   . CATARACT EXTRACTION W/ INTRAOCULAR LENS  IMPLANT, BILATERAL Bilateral ~ 2014  . CERVICAL DISC SURGERY  1990s  . CORONARY ANGIOPLASTY WITH STENT PLACEMENT  05/2013  . CORONARY ANGIOPLASTY WITH STENT PLACEMENT  07/26/2013   DES to RCA extending to PDA    . CORONARY ANGIOPLASTY WITH STENT PLACEMENT  07/30/2016  . CORONARY ANGIOPLASTY WITH STENT PLACEMENT  2000   CAD  . DENTAL SURGERY  04/2016   "got infected; had to dig it out"  . KNEE ARTHROSCOPY Right 1990's   rt  . LEFT AND RIGHT HEART CATHETERIZATION WITH CORONARY ANGIOGRAM N/A 07/26/2013   Procedure: LEFT AND RIGHT HEART CATHETERIZATION WITH CORONARY ANGIOGRAM;  Surgeon: [name], MD;  Location: [place] [facility] CATH LAB;  Service: Cardiovascular;  Laterality: N/A;  . LEFT HEART CATHETERIZATION WITH CORONARY ANGIOGRAM N/A 05/19/2013   Procedure: LEFT HEART CATHETERIZATION WITH CORONARY ANGIOGRAM;  Surgeon: [name], MD;  Location: [facility] CATH LAB;  Service: Cardiovascular;  Laterality: N/A;  . LUMBAR LAMINECTOMY/DECOMPRESSION MICRODISCECTOMY  08/30/2012   Procedure: LUMBAR LAMINECTOMY/DECOMPRESSION MICRODISCECTOMY 2 LEVELS;  Surgeon: [name], MD;  Location: [place] NEURO ORS;  Service: Neurosurgery;  Laterality: Bilateral;  Bilateral Lumbar three-four Lumbar four-five Laminotomies  . LUMBAR SPINE SURGERY  04/07/2016   Dr. Ellene Route; "?ruptured disc"  . MELANOMA EXCISION Left 02/2011   forearm    . NASAL SINUS SURGERY  1970s   "cut windows in sinus pockets"  . PERCUTANEOUS CORONARY STENT INTERVENTION (PCI-S)  05/19/2013   Procedure: PERCUTANEOUS CORONARY STENT INTERVENTION (PCI-S);  Surgeon: [name], MD;  Location[facility] C[facility] LAB;  Service: Cardiovascular;;  . ULNAR TUNNEL RELEASE Left 04/07/2016   Dr. Ellene Route       Home Medications    Prior to Admission medications   Medication Sig Start Date End Date Taking? Authorizing Provider  acetaminophen (TYLENOL) 500 MG tablet Take 1,000 mg by mouth 2 (two) times daily.    Historical Provider, MD  albuterol (PROVENTIL HFA;VENTOLIN HFA) 108 (90 BASE) MCG/ACT inhaler Inhale 2 puffs into the lungs every 6 (six) hours as needed for wheezing or shortness of breath. 04/05/15   [name], MD  allopurinol (ZYLOPRIM) 300 MG tablet TAKE 1 TABLET EVERY DAY 02/25/16   Bonnita Levan  Charlett Blake, MD  aspirin EC 81 MG tablet Take 81 mg by mouth daily. 02/23/11   [name], MD  atorvastatin (LIPITOR) 40 MG tablet Take 1 tablet (40 mg total) by mouth daily. 05/15/16   [name], MD  B Complex Vitamins (B COMPLEX-B12 PO) Take 1 tablet by mouth at bedtime. 07/23/16   Historical Provider, MD  Cholecalciferol (VITAMIN D) 1000 UNITS capsule Take 1,000 Units by mouth at bedtime.     Historical Provider, MD  cyclobenzaprine (FLEXERIL) 5 MG tablet Take 1 tablet (5 mg total) by mouth 3 (three) times daily as needed for muscle spasms. Patient taking differently: Take 5 mg by mouth 2 (two) times daily as needed for muscle spasms.  05/15/16   [name], MD  fenofibrate 160 MG tablet TAKE 1 TABLET EVERY DAY Patient taking differently: TAKE 1 TABLET EVERY night 12/02/15   [name], MD  fluocinonide cream (LIDEX) [id number] % Apply 1 application topically daily as needed. FOR RASH 08/11/12   Historical Provider, MD  fluticasone (FLONASE) 50 MCG/ACT nasal spray Place 2 sprays into both nostrils daily. Patient taking differently: Place 2 sprays into both nostrils as  needed.  10/10/14   Percell Miller Saguier, PA-C  furosemide (LASIX) 80 MG tablet Take 0.5 tablets (40 mg total) by mouth daily. 08/01/16   Dayna N Dunn, PA-C  insulin NPH Human (NOVOLIN N) 100 UNIT/ML injection Inject 40 units in am and 35 units in pm Patient taking differently: Inject 40 Units into the skin 2 (two) times daily before a meal.  05/28/16   [name], MD  insulin regular (NOVOLIN R RELION) 100 units/mL injection Inject 0.25-0.3 mLs (25-30 Units total) into the skin 3 (three) times daily before meals. Patient taking differently: Inject 30 Units into the skin 3 (three) times daily before meals.  11/28/15   [name], MD  isosorbide mononitrate (IMDUR) 30 MG 24 hr tablet TAKE 1 TABLET EVERY DAY 08/01/15   [name], MD  metoprolol (LOPRESSOR) 50 MG tablet TAKE 1 TABLET TWICE DAILY 12/02/15   [name], MD  nitroGLYCERIN (NITROSTAT) 0.4 MG SL tablet Place 1 tablet (0.4 mg total) under the tongue every 5 (five) minutes as needed for chest pain. 07/05/14   [name], MD  pantoprazole (PROTONIX) 20 MG tablet Take 1 tablet (20 mg total) by mouth daily. 05/26/16   [name], MD  tamsulosin (FLOMAX) 0.4 MG CAPS capsule TAKE 1 CAPSULE EVERY DAY 03/18/16   [name], MD  ticagrelor (BRILINTA) 90 MG TABS tablet Take 1 tablet (90 mg total) by mouth 2 (two) times daily. 07/31/16   Charlie Pitter, PA-C    Family History Family History  Problem Relation Age of Onset  . Cancer Mother     intestinal   . Heart disease Mother   . Cancer Father     prostate  . Migraines Daughter   . Leukemia Sister   . Heart disease Sister   . Prostate cancer    . Kidney cancer    . Cancer      Bladder cancer  . Coronary artery disease    . Hypertension Sister   . Hypertension Brother   . Heart attack Neg Hx   . Stroke Neg Hx     Social History Social History  Substance Use Topics  . Smoking status: Former Smoker    Packs/day: 3.00    Years: 30.00    Types: Cigarettes  Quit date: 09/17/1996  . Smokeless tobacco: Never Used  . Alcohol use No     Allergies   Levemir [insulin detemir]; Codeine; Lipitor [atorvastatin]; and Novolog mix [insulin aspart prot & aspart]   Review of Systems Review of Systems  Respiratory: Positive for cough and shortness of breath.   Cardiovascular: Positive for chest pain.  All other systems reviewed and are negative.    Physical Exam Updated Vital Signs BP 128/70   Pulse 106   Temp 98.1 F (36.7 C)   Resp 18   Ht 5\' 10"  (1.778 m)   Wt 268 lb (121.6 kg)   SpO2 97%   BMI 38.45 kg/m   Physical Exam  Constitutional: He appears well-developed and well-nourished.  HENT:  Head: Normocephalic and atraumatic.  Eyes: Conjunctivae are normal.  Neck: Neck supple.  Cardiovascular: Normal rate and regular rhythm.   No murmur heard. Pulmonary/Chest: Effort normal and breath sounds normal. No respiratory distress.  No wheezes, pursed lip breathing. Tachypnea.   Abdominal: Soft. There is no tenderness.  Musculoskeletal: He exhibits no edema.  Neurological: He is alert.  Skin: Skin is warm and dry.  Mild hematoma to right wrist  Psychiatric: He has a normal mood and affect.  Nursing note and vitals reviewed.    ED Treatments / Results  DIAGNOSTIC STUDIES:  Oxygen Saturation is 97% on RA, normal by my interpretation.    COORDINATION OF CARE:  9:56 PM Discussed treatment plan with pt at bedside and pt agreed to plan.   Labs (all labs ordered are listed, but only abnormal results are displayed) Labs Reviewed  TROPONIN I - Abnormal; Notable for the following:       Result Value   Troponin I 0.10 (*)    All other components within normal limits  BASIC METABOLIC PANEL - Abnormal; Notable for the following:    Glucose, Bld 138 (*)    BUN 28 (*)    Creatinine, Ser 1.78 (*)    GFR calc non Af Amer 35 (*)    GFR calc Af Amer 41 (*)    All other components within normal limits  CBC WITH DIFFERENTIAL/PLATELET     EKG  EKG Interpretation  Date/Time:  Saturday August 01 2016 21:27:23 EDT Ventricular Rate:  111 PR Interval:  160 QRS Duration: 92 QT Interval:  338 QTC Calculation: 459 R Axis:   -74 Text Interpretation:  Sinus tachycardia Incomplete right bundle branch block Left anterior fascicular block Cannot rule out Anterior infarct , age undetermined Abnormal ECG rate increased since previous Confirmed by Alvino Chapel  MD, Joanna Hall [phone]) on 08/01/2016 9:31:45 PM       Radiology Dg Chest 2 View  Result Date: 08/01/2016 CLINICAL DATA:  Acute acute onset worsening chest pain for 2 days. History of CHF, hypertension, diabetes. EXAM: CHEST  2 VIEW COMPARISON:  Chest radiograph July 30, 2016 FINDINGS: The cardiac silhouette is mildly enlarged and unchanged. Coronary artery stent. Mildly calcified aortic knob. No pleural effusion or focal consolidation. No pneumothorax. Soft tissue planes included osseous structures are nonsuspicious unchanged, moderate degenerative change of thoracic spine. IMPRESSION: Mild cardiomegaly, no acute pulmonary process. Electronically Signed   By: Elon Alas M.D.   On: 08/01/2016 23:12    Procedures Procedures (including critical care time)  Medications Ordered in ED Medications - No data to display   Initial Impression / Assessment and Plan / ED Course  I have reviewed the triage vital signs and the nursing notes.  Pertinent labs &  imaging results that were available during my care of the patient were reviewed by me and considered in my medical decision making (see chart for details).  Clinical Course    Patient's had shortness of breath since yesterday. On Thursday he had a heart catheterization with stents for a non-STEMI. Ejection fraction not done due to renal insufficiency. Now is more short of breath. States she's having trouble with exertion. States he has had a little bit of a cough 2 no chest pain. No swelling of his legs. States it feels as  if he could have too much fluid. Patient states he also felt as if he felt some phlegm in his chest that needed to come out. States this does not feel like his recent MI. EKG reassuring. Troponin at 0.1 but decreased from 0.17 yesterday. X-ray does not show CHF. No swelling of his legs. Lung exam shows clear lungs. Has some continue tachycardia. Has a chronically elevated d-dimer. His creatinine is gone from 1.3-1.8.echocardiogram was not done and ejection fraction also not done due to the baseline renal insufficiency. Will discuss with cardiology about admission at [facility].   Patient is been accepted for admission.  Final Clinical Impressions(s) / ED Diagnoses   Final diagnoses:  Dyspnea, unspecified type  Renal insufficiency    New Prescriptions New Prescriptions   No medications on file  I personally performed the services described in this documentation, which was scribed in my presence. The recorded information has been reviewed and is accurate.        Davonna Belling, MD 08/01/16 [id number]    Davonna Belling, MD 08/01/16 [phone]

## 2016-08-01 NOTE — ED Notes (Signed)
MD aware of elevated troponin. No orders received.

## 2016-08-02 ENCOUNTER — Inpatient Hospital Stay (HOSPITAL_COMMUNITY): Payer: Commercial Managed Care - HMO

## 2016-08-02 ENCOUNTER — Encounter (HOSPITAL_BASED_OUTPATIENT_CLINIC_OR_DEPARTMENT_OTHER): Payer: Self-pay | Admitting: Emergency Medicine

## 2016-08-02 DIAGNOSIS — R071 Chest pain on breathing: Secondary | ICD-10-CM | POA: Diagnosis not present

## 2016-08-02 DIAGNOSIS — E1122 Type 2 diabetes mellitus with diabetic chronic kidney disease: Secondary | ICD-10-CM | POA: Diagnosis present

## 2016-08-02 DIAGNOSIS — I10 Essential (primary) hypertension: Secondary | ICD-10-CM

## 2016-08-02 DIAGNOSIS — R0609 Other forms of dyspnea: Secondary | ICD-10-CM | POA: Diagnosis not present

## 2016-08-02 DIAGNOSIS — I714 Abdominal aortic aneurysm, without rupture: Secondary | ICD-10-CM | POA: Diagnosis present

## 2016-08-02 DIAGNOSIS — R0602 Shortness of breath: Secondary | ICD-10-CM | POA: Diagnosis not present

## 2016-08-02 DIAGNOSIS — E781 Pure hyperglyceridemia: Secondary | ICD-10-CM | POA: Diagnosis present

## 2016-08-02 DIAGNOSIS — N183 Chronic kidney disease, stage 3 (moderate): Secondary | ICD-10-CM | POA: Diagnosis present

## 2016-08-02 DIAGNOSIS — N189 Chronic kidney disease, unspecified: Secondary | ICD-10-CM | POA: Diagnosis not present

## 2016-08-02 DIAGNOSIS — I2510 Atherosclerotic heart disease of native coronary artery without angina pectoris: Secondary | ICD-10-CM

## 2016-08-02 DIAGNOSIS — R079 Chest pain, unspecified: Secondary | ICD-10-CM | POA: Diagnosis present

## 2016-08-02 DIAGNOSIS — Z794 Long term (current) use of insulin: Secondary | ICD-10-CM | POA: Diagnosis not present

## 2016-08-02 DIAGNOSIS — Z23 Encounter for immunization: Secondary | ICD-10-CM | POA: Diagnosis not present

## 2016-08-02 DIAGNOSIS — E669 Obesity, unspecified: Secondary | ICD-10-CM | POA: Diagnosis present

## 2016-08-02 DIAGNOSIS — I5032 Chronic diastolic (congestive) heart failure: Secondary | ICD-10-CM | POA: Diagnosis present

## 2016-08-02 DIAGNOSIS — Z7902 Long term (current) use of antithrombotics/antiplatelets: Secondary | ICD-10-CM | POA: Diagnosis not present

## 2016-08-02 DIAGNOSIS — B349 Viral infection, unspecified: Secondary | ICD-10-CM | POA: Diagnosis present

## 2016-08-02 DIAGNOSIS — Z7982 Long term (current) use of aspirin: Secondary | ICD-10-CM | POA: Diagnosis not present

## 2016-08-02 DIAGNOSIS — K219 Gastro-esophageal reflux disease without esophagitis: Secondary | ICD-10-CM | POA: Diagnosis present

## 2016-08-02 DIAGNOSIS — G4733 Obstructive sleep apnea (adult) (pediatric): Secondary | ICD-10-CM | POA: Diagnosis present

## 2016-08-02 DIAGNOSIS — G8929 Other chronic pain: Secondary | ICD-10-CM | POA: Diagnosis present

## 2016-08-02 DIAGNOSIS — N289 Disorder of kidney and ureter, unspecified: Secondary | ICD-10-CM | POA: Diagnosis present

## 2016-08-02 DIAGNOSIS — E1159 Type 2 diabetes mellitus with other circulatory complications: Secondary | ICD-10-CM | POA: Diagnosis present

## 2016-08-02 DIAGNOSIS — I509 Heart failure, unspecified: Secondary | ICD-10-CM | POA: Diagnosis not present

## 2016-08-02 DIAGNOSIS — M109 Gout, unspecified: Secondary | ICD-10-CM | POA: Diagnosis present

## 2016-08-02 DIAGNOSIS — M545 Low back pain: Secondary | ICD-10-CM | POA: Diagnosis present

## 2016-08-02 DIAGNOSIS — I130 Hypertensive heart and chronic kidney disease with heart failure and stage 1 through stage 4 chronic kidney disease, or unspecified chronic kidney disease: Secondary | ICD-10-CM | POA: Diagnosis present

## 2016-08-02 DIAGNOSIS — E1142 Type 2 diabetes mellitus with diabetic polyneuropathy: Secondary | ICD-10-CM | POA: Diagnosis present

## 2016-08-02 DIAGNOSIS — Z955 Presence of coronary angioplasty implant and graft: Secondary | ICD-10-CM | POA: Diagnosis not present

## 2016-08-02 DIAGNOSIS — E1165 Type 2 diabetes mellitus with hyperglycemia: Secondary | ICD-10-CM | POA: Diagnosis present

## 2016-08-02 LAB — CK TOTAL AND CKMB (NOT AT ARMC)
CK, MB: 2.2 ng/mL (ref 0.5–5.0)
Relative Index: 1.5 (ref 0.0–2.5)
Total CK: 145 U/L (ref 49–397)

## 2016-08-02 LAB — BASIC METABOLIC PANEL
Anion gap: 10 (ref 5–15)
BUN: 26 mg/dL — ABNORMAL HIGH (ref 6–20)
CALCIUM: 9.9 mg/dL (ref 8.9–10.3)
CO2: 23 mmol/L (ref 22–32)
CREATININE: 1.55 mg/dL — AB (ref 0.61–1.24)
Chloride: 106 mmol/L (ref 101–111)
GFR, EST AFRICAN AMERICAN: 48 mL/min — AB (ref >60)
GFR, EST NON AFRICAN AMERICAN: 42 mL/min — AB (ref >60)
Glucose, Bld: 149 mg/dL — ABNORMAL HIGH (ref 65–99)
Potassium: 3.7 mmol/L (ref 3.5–5.1)
SODIUM: 139 mmol/L (ref 135–145)

## 2016-08-02 LAB — GLUCOSE, CAPILLARY
GLUCOSE-CAPILLARY: 205 mg/dL — AB (ref 65–99)
GLUCOSE-CAPILLARY: 234 mg/dL — AB (ref 65–99)
Glucose-Capillary: 139 mg/dL — ABNORMAL HIGH (ref 65–99)
Glucose-Capillary: 288 mg/dL — ABNORMAL HIGH (ref 65–99)
Glucose-Capillary: 192 mg/dL — ABNORMAL HIGH (ref 65–99)

## 2016-08-02 LAB — MAGNESIUM: MAGNESIUM: 1.8 mg/dL (ref 1.7–2.4)

## 2016-08-02 LAB — CBC
HEMATOCRIT: 40.9 % (ref 39.0–52.0)
HEMOGLOBIN: 13.9 g/dL (ref 13.0–17.0)
MCH: 31.1 pg (ref 26.0–34.0)
MCHC: 34 g/dL (ref 30.0–36.0)
MCV: 91.5 fL (ref 78.0–100.0)
Platelets: 163 10*3/uL (ref 150–400)
RBC: 4.47 MIL/uL (ref 4.22–5.81)
RDW: 14.1 % (ref 11.5–15.5)
WBC: 10.6 10*3/uL — AB (ref 4.0–10.5)

## 2016-08-02 LAB — D-DIMER, QUANTITATIVE: D-Dimer, Quant: >20 ug/mL-FEU — ABNORMAL HIGH (ref 0.00–0.50)

## 2016-08-02 LAB — TROPONIN I
TROPONIN I: 0.07 ng/mL — AB (ref <0.03)
Troponin I: 0.09 ng/mL (ref <0.03)
Troponin I: 0.06 ng/mL (ref <0.03)

## 2016-08-02 LAB — HEPARIN LEVEL (UNFRACTIONATED)
HEPARIN UNFRACTIONATED: 0.25 [IU]/mL — AB (ref 0.30–0.70)
Heparin Unfractionated: 0.34 IU/mL (ref 0.30–0.70)

## 2016-08-02 LAB — BRAIN NATRIURETIC PEPTIDE: B NATRIURETIC PEPTIDE 5: 19.7 pg/mL (ref 0.0–100.0)

## 2016-08-02 LAB — MRSA PCR SCREENING: MRSA BY PCR: NEGATIVE

## 2016-08-02 MED ORDER — FUROSEMIDE 40 MG PO TABS
40.0000 mg | ORAL_TABLET | Freq: Every day | ORAL | Status: DC
  Administered 2016-08-02 – 2016-08-04 (×3): 40 mg via ORAL
  Filled 2016-08-02 (×3): qty 1

## 2016-08-02 MED ORDER — HEPARIN BOLUS VIA INFUSION
1500.0000 [IU] | Freq: Once | INTRAVENOUS | Status: AC
  Administered 2016-08-02: 1500 [IU] via INTRAVENOUS
  Filled 2016-08-02: qty 1500

## 2016-08-02 MED ORDER — INSULIN ASPART 100 UNIT/ML ~~LOC~~ SOLN: 0.0000 [IU] | Freq: Every day | SUBCUTANEOUS | Status: DC

## 2016-08-02 MED ORDER — ALLOPURINOL 300 MG PO TABS
300.0000 mg | ORAL_TABLET | Freq: Every day | ORAL | Status: DC
  Administered 2016-08-02 – 2016-08-04 (×3): 300 mg via ORAL
  Filled 2016-08-02 (×3): qty 1

## 2016-08-02 MED ORDER — TAMSULOSIN HCL 0.4 MG PO CAPS
0.4000 mg | ORAL_CAPSULE | Freq: Every day | ORAL | Status: DC
  Administered 2016-08-02 – 2016-08-04 (×3): 0.4 mg via ORAL
  Filled 2016-08-02 (×3): qty 1

## 2016-08-02 MED ORDER — INSULIN NPH (HUMAN) (ISOPHANE) 100 UNIT/ML ~~LOC~~ SUSP
40.0000 [IU] | Freq: Every day | SUBCUTANEOUS | Status: DC
  Administered 2016-08-02 – 2016-08-04 (×3): 40 [IU] via SUBCUTANEOUS
  Filled 2016-08-02: qty 10

## 2016-08-02 MED ORDER — ACETAMINOPHEN 325 MG PO TABS
650.0000 mg | ORAL_TABLET | ORAL | Status: DC | PRN
  Administered 2016-08-03: 650 mg via ORAL
  Filled 2016-08-02: qty 2

## 2016-08-02 MED ORDER — NON FORMULARY: 30.0000 [IU] | Freq: Three times a day (TID) | Status: DC

## 2016-08-02 MED ORDER — FENOFIBRATE 160 MG PO TABS
160.0000 mg | ORAL_TABLET | Freq: Every day | ORAL | Status: DC
  Administered 2016-08-02 – 2016-08-04 (×3): 160 mg via ORAL
  Filled 2016-08-02 (×3): qty 1

## 2016-08-02 MED ORDER — HEPARIN (PORCINE) IN NACL 100-0.45 UNIT/ML-% IJ SOLN
1400.0000 [IU]/h | INTRAMUSCULAR | Status: DC
  Administered 2016-08-02: 1400 [IU]/h via INTRAVENOUS
  Filled 2016-08-02: qty 250

## 2016-08-02 MED ORDER — VITAMIN D 1000 UNITS PO TABS
1000.0000 [IU] | ORAL_TABLET | Freq: Every day | ORAL | Status: DC
  Administered 2016-08-02 – 2016-08-03 (×2): 1000 [IU] via ORAL
  Filled 2016-08-02 (×2): qty 1

## 2016-08-02 MED ORDER — ONDANSETRON HCL 4 MG/2ML IJ SOLN: 4.0000 mg | Freq: Four times a day (QID) | INTRAMUSCULAR | Status: DC | PRN

## 2016-08-02 MED ORDER — INSULIN NPH (HUMAN) (ISOPHANE) 100 UNIT/ML ~~LOC~~ SUSP: 35.0000 [IU] | Freq: Every day | SUBCUTANEOUS | Status: DC

## 2016-08-02 MED ORDER — ALBUTEROL SULFATE (2.5 MG/3ML) 0.083% IN NEBU
2.5000 mg | INHALATION_SOLUTION | Freq: Four times a day (QID) | RESPIRATORY_TRACT | Status: DC | PRN
  Administered 2016-08-02: 2.5 mg via RESPIRATORY_TRACT
  Filled 2016-08-02: qty 3

## 2016-08-02 MED ORDER — ATORVASTATIN CALCIUM 40 MG PO TABS
40.0000 mg | ORAL_TABLET | Freq: Every day | ORAL | Status: DC
  Administered 2016-08-02 – 2016-08-03 (×2): 40 mg via ORAL
  Filled 2016-08-02 (×2): qty 1

## 2016-08-02 MED ORDER — ASPIRIN EC 81 MG PO TBEC
81.0000 mg | DELAYED_RELEASE_TABLET | Freq: Every day | ORAL | Status: DC
  Administered 2016-08-02 – 2016-08-04 (×3): 81 mg via ORAL
  Filled 2016-08-02 (×3): qty 1

## 2016-08-02 MED ORDER — PANTOPRAZOLE SODIUM 20 MG PO TBEC
20.0000 mg | DELAYED_RELEASE_TABLET | Freq: Every day | ORAL | Status: DC
  Administered 2016-08-02 – 2016-08-04 (×3): 20 mg via ORAL
  Filled 2016-08-02 (×4): qty 1

## 2016-08-02 MED ORDER — HEPARIN (PORCINE) IN NACL 100-0.45 UNIT/ML-% IJ SOLN
1600.0000 [IU]/h | INTRAMUSCULAR | Status: DC
  Administered 2016-08-03: 1600 [IU]/h via INTRAVENOUS
  Filled 2016-08-02 (×2): qty 250

## 2016-08-02 MED ORDER — ISOSORBIDE MONONITRATE ER 30 MG PO TB24
30.0000 mg | ORAL_TABLET | Freq: Every day | ORAL | Status: DC
  Administered 2016-08-02 – 2016-08-04 (×3): 30 mg via ORAL
  Filled 2016-08-02 (×3): qty 1

## 2016-08-02 MED ORDER — TECHNETIUM TC 99M DIETHYLENETRIAME-PENTAACETIC ACID: 32.1000 | Freq: Once | INTRAVENOUS | Status: DC | PRN

## 2016-08-02 MED ORDER — TICAGRELOR 90 MG PO TABS
90.0000 mg | ORAL_TABLET | Freq: Two times a day (BID) | ORAL | Status: DC
  Administered 2016-08-02 – 2016-08-03 (×3): 90 mg via ORAL
  Filled 2016-08-02 (×3): qty 1

## 2016-08-02 MED ORDER — FLUTICASONE PROPIONATE 50 MCG/ACT NA SUSP: 2.0000 | Freq: Every day | NASAL | Status: DC | PRN

## 2016-08-02 MED ORDER — NITROGLYCERIN 0.4 MG SL SUBL: 0.4000 mg | SUBLINGUAL_TABLET | SUBLINGUAL | Status: DC | PRN

## 2016-08-02 MED ORDER — METOPROLOL TARTRATE 50 MG PO TABS
50.0000 mg | ORAL_TABLET | Freq: Two times a day (BID) | ORAL | Status: DC
  Administered 2016-08-02 – 2016-08-04 (×6): 50 mg via ORAL
  Filled 2016-08-02 (×6): qty 1

## 2016-08-02 MED ORDER — INSULIN REGULAR HUMAN 100 UNIT/ML IJ SOLN
30.0000 [IU] | Freq: Three times a day (TID) | INTRAMUSCULAR | Status: DC
  Administered 2016-08-02 – 2016-08-04 (×5): 30 [IU] via SUBCUTANEOUS
  Filled 2016-08-02 (×25): qty 0.3

## 2016-08-02 MED ORDER — HEPARIN BOLUS VIA INFUSION
4000.0000 [IU] | Freq: Once | INTRAVENOUS | Status: AC
  Administered 2016-08-02: 4000 [IU] via INTRAVENOUS

## 2016-08-02 MED ORDER — INSULIN ASPART 100 UNIT/ML ~~LOC~~ SOLN: 0.0000 [IU] | Freq: Three times a day (TID) | SUBCUTANEOUS | Status: DC

## 2016-08-02 MED ORDER — INSULIN NPH (HUMAN) (ISOPHANE) 100 UNIT/ML ~~LOC~~ SUSP
40.0000 [IU] | Freq: Every day | SUBCUTANEOUS | Status: DC
Start: 2016-08-02 — End: 2016-08-04
  Administered 2016-08-02 – 2016-08-03 (×2): 40 [IU] via SUBCUTANEOUS

## 2016-08-02 MED ORDER — TECHNETIUM TO 99M ALBUMIN AGGREGATED
4.0000 | Freq: Once | INTRAVENOUS | Status: AC | PRN
  Administered 2016-08-02: 4 via INTRAVENOUS

## 2016-08-02 MED ORDER — INSULIN LISPRO 100 UNIT/ML (KWIKPEN): 0.0000 [IU] | PEN_INJECTOR | Freq: Three times a day (TID) | SUBCUTANEOUS | Status: DC

## 2016-08-02 NOTE — Progress Notes (Signed)
ANTICOAGULATION CONSULT NOTE - Follow Up Consult  Pharmacy Consult for Heparin Indication: chest pain/ACS  Allergies  Allergen Reactions  . Levemir [Insulin Detemir] Swelling    Patient had redness and swelling and tenderness at injection site.  . Codeine Other (See Comments)    Makes him "shakey", is OK with hydrocodone GI UPSET & TREMORS  . Lipitor [Atorvastatin] Other (See Comments)    Muscle aches; liver functions. Patient is currently taken  . Novolog Mix [Insulin Aspart Prot & Aspart]     Causes skin to swell/itch at injection site    Patient Measurements: Height: 5\' 10"  (177.8 cm) Weight: 268 lb 14.4 oz (122 kg) IBW/kg (Calculated) : 73 Heparin Dosing Weight: 100 kg  Vital Signs: Temp: 99 F (37.2 C) (10/22 2016) Temp Source: Oral (10/22 2016) BP: 131/63 (10/22 2016) Pulse Rate: 94 (10/22 2016)  Labs:  Recent Labs  07/31/16 0037 08/01/16 2210 08/02/16 0239 08/02/16 0951 08/02/16 1535 08/02/16 2046  HGB 13.6 13.9 13.9  --   --   --   HCT 40.9 40.6 40.9  --   --   --   PLT 149* 177 163  --   --   --   HEPARINUNFRC  --   --   --  0.25*  --  0.34  CREATININE 1.33* 1.78* 1.55*  --   --   --   CKTOTAL  --   --  145  --   --   --   CKMB  --   --  2.2  --   --   --   TROPONINI 0.17* 0.10* 0.09* 0.07* 0.06*  --     Estimated Creatinine Clearance: 53.1 mL/min (by C-G formula based on SCr of 1.55 mg/dL (H)).   Medical History: Past Medical History:  Diagnosis Date  . AAA (abdominal aortic aneurysm) ([place]) 12/15/2014   a. Mild aneurysmal dilatation of the infrarenal abdominal aorta 3.2 cm - f/u due by 2019.  . Arthritis    "all over" (07/30/2016)  . CAD (coronary artery disease)    a. stent to LAD 2000. b. possible spasm by cath 2001. c. IVUS/PTCA/DES to mLAD 05/2013. d. PTCA/DES of dRCA into ostial rPDA 07/2013. e. PTCA of OM2, DES to Cx, [facility] 07[facility].  Marland Kitchen Chronic bronchitis ([place])   . Chronic diastolic CHF (congestive heart failure) ([place])   . Chronic  lower back pain   . Chronic neck pain   . CKD (chronic kidney disease), stage III   . Diabetic peripheral neuropathy ([place])   . Ejection fraction    55%, 07/2010, mild inferior hypo  . GERD (gastroesophageal reflux disease)   . Gout   . H/O diverticulitis of colon 01/31/2015  . H/O hiatal hernia   . History of blood transfusion ~ 10/1940   "had pneumonia"  . HTN (hypertension)   . Hyperlipidemia   . Melanoma of forearm, left ([place]) 02/2011   with wide excision   . Nephrolithiasis   . Obesity   . OSA on CPAP     [name] since 2000  . Positive D-dimer    a. significant elevation ,hospital 07/2010, etiology unclear. b. D Dimer chronically > 20.  . Skin cancer   . Spinal stenosis    a. s/p surgical repair 2013  . Type II diabetes mellitus ([place])   . Ventral hernia       Assessment: 75 y.o. M presents to [facility] with CP. Troponin up to 0.1. To begin heparin for  ACS.   Heparin drip rate 1600 uts/hr  heparin level therapeutic at 0.34 CBC stable, no bleeding noted  Goal of Therapy:  Heparin level 0.3-0.7 units/ml Monitor platelets by anticoagulation protocol: Yes   Plan:  Continue  Heparin infusion to 1600 IV units/hr s Daily heparin level and CBC  Bonnita Nasuti Pharm.D. CPP, BCPS Clinical Pharmacist [phone] 08/02/2016 9:37 PM

## 2016-08-02 NOTE — Progress Notes (Addendum)
ANTICOAGULATION CONSULT NOTE - Initial Consult  Pharmacy Consult for Heparin Indication: chest pain/ACS  Allergies  Allergen Reactions  . Levemir [Insulin Detemir] Swelling    Patient had redness and swelling and tenderness at injection site.  . Codeine Other (See Comments)    Makes him "shakey", is OK with hydrocodone GI UPSET & TREMORS  . Lipitor [Atorvastatin] Other (See Comments)    Muscle aches; liver functions. Patient is currently taken  . Novolog Mix [Insulin Aspart Prot & Aspart]     Causes skin to swell/itch at injection site    Patient Measurements: Height: 5\' 10"  (177.8 cm) Weight: 268 lb (121.6 kg) IBW/kg (Calculated) : 73 Heparin Dosing Weight: 100 kg  Vital Signs: Temp: 98.1 F (36.7 C) (10/21 2124) BP: 137/74 (10/21 2315) Pulse Rate: 99 (10/21 2315)  Labs:  Recent Labs  07/30/16 1100  07/30/16 1936 07/31/16 0037 08/01/16 2210  HGB 14.8  --   --  13.6 13.9  HCT 43.2  --   --  40.9 40.6  PLT 168  --   --  149* 177  CREATININE 1.37*  --   --  1.33* 1.78*  TROPONINI  --   < > 0.08* 0.17* 0.10*  < > = values in this interval not displayed.  Estimated Creatinine Clearance: 46.1 mL/min (by C-G formula based on SCr of 1.78 mg/dL (H)).   Medical History: Past Medical History:  Diagnosis Date  . AAA (abdominal aortic aneurysm) ([place]) 12/15/2014   a. Mild aneurysmal dilatation of the infrarenal abdominal aorta 3.2 cm - f/u due by 2019.  . Arthritis    "all over" (07/30/2016)  . CAD (coronary artery disease)    a. stent to LAD 2000. b. possible spasm by cath 2001. c. IVUS/PTCA/DES to mLAD 05/2013. d. PTCA/DES of dRCA into ostial rPDA 07/2013. e. PTCA of OM2, DES to Cx, [facility] to [facility] 07/2016.  Marland Kitchen Chronic bronchitis ([place])   . Chronic diastolic CHF (congestive heart failure) ([place])   . Chronic lower back pain   . Chronic neck pain   . CKD (chronic kidney disease), stage III   . Diabetic peripheral neuropathy ([place])   . Ejection fraction    55%, 07/2010, mild  inferior hypo  . GERD (gastroesophageal reflux disease)   . Gout   . H/O diverticulitis of colon 01/31/2015  . H/O hiatal hernia   . History of blood transfusion ~ 10/1940   "had pneumonia"  . HTN (hypertension)   . Hyperlipidemia   . Melanoma of forearm, left ([place]) 02/2011   with wide excision   . Nephrolithiasis   . Obesity   . OSA on CPAP     [name] since 2000  . Positive D-dimer    a. significant elevation ,hospital 07/2010, etiology unclear. b. D Dimer chronically > 20.  . Skin cancer   . Spinal stenosis    a. s/p surgical repair 2013  . Type II diabetes mellitus ([place])   . Ventral hernia     Medications:  See electronic med rec  Assessment: 75 y.o. M presents to [facility] with CP. Troponin up to 0.1. To begin heparin for ACS. CBC ok on admission.  Goal of Therapy:  Heparin level 0.3-0.7 units/ml Monitor platelets by anticoagulation protocol: Yes   Plan:  Heparin IV bolus 4000 units Heparin gtt at 1400 units/hr Will f/u heparin level in 8 hours Daily heparin level and CBC  Sherlon Handing, PharmD, BCPS Clinical pharmacist, pager [phone] 08/02/2016,12:05 AM   Addendum: Pharmacy  consulted to help with inpt insulin dosing. CBG 139 on admission.  Home dose of insulin: NPH 40 units in AM and 35 units in PM; Regular insulin 25-30 units with meals. **Pt with allergies to both Levemir and Novolog mix**  Plan: Begin home dose of NPH  Will begin SSI moderate TIDWC with Lispro insulin with Novolog allergy Will hold pre-meal home regular insulin for right now and f/u CBGs to see if pt requires  Sherlon Handing, PharmD, BCPS Clinical pharmacist, pager [phone] 08/02/2016 2:35 AM

## 2016-08-02 NOTE — Plan of Care (Signed)
Problem: Pain Managment: Goal: General experience of comfort will improve Outcome: Not Progressing Pt c/o of rib pain rated a 7/10. Pain does correlate to some CP as stated by patient. Pt is SOB at rest and on exertion. Pt given a breathing tx which provided much relief.

## 2016-08-02 NOTE — Progress Notes (Signed)
   Was paged by RN regarding critical lab value. D-dimer > 20. Patient admitted for SOB. Seen by fellow overnight. Per H&P, plan was for V/Q scan to r/o PE if abnormal laboratory work, given CKD and recent contrast exposure. Will order V/Q scan for r/o. Will f/u on result. MD to follow with further recommendations during morning rounds.   Lyda Jester 08/02/2016

## 2016-08-02 NOTE — H&P (Signed)
History and Physical  Primary Cardiologist:[facility] PCP: Penni Homans, MD  Chief Complaint: Dyspnea, chest pain, cough  HPI:   75 year old man with history of coronary artery disease status post prior PCI's to the LAD and RCA (stent to LAD 2000, possible spasm by cath 2001, IVUS/PTCA/DES to [place] 05/2013, PTCA/DES of dRCA into ostial rPDA 07/2013), AAA, diabetes, hypertension, history of sleep apnea, chronic kidney disease, who recently underwent on 07/30/16 2V PCI (LCx and mid RCA) discharged yesterday.  At the time of the cath upper normal to mildly elevated LV filling pressures, LV gram not performed due to CKD.  Last EF by TTE 2014 and nl 55-60%.    Acutely over the course of the past half day, he has developed dyspnea on exertion that has been progressive and also orthopnea.  No edema.  Has bad OSA so has some of this chronically.  However, his dyspnea has progressed to inability to finish full sentences without getting winded.  Prior to discharge, he did not notice these symptoms (developed post-D/C).  Denies fevers, chills, COPD flare.  He notes that he is making normal amount of urine.  He has also noticed a cough with clear sputum production.  He denies missing any medications including Brillinta/ASA.  No bleeding/bruiding other than right radial access site (had oozing when changing bandages, now resolved).  No real chest pain.  Presented to an outside ED and given recent care here, transferred to [facility]     Previous studies  Cath 07/31/16 Conclusions: 1. Two-vessel coronary artery disease, including 80% mid LCx stenosis with the appearance of a ruptured plaque involving the ostium of OM2, as well as hazy, ulcerated 50% stenosis in the mid RCA concerning for unstable plaque. 2. Widely patent stents in the proximal LAD and distal RCA/ostial PDA. 3. Upper normal to mildly elevated left ventricular filling pressure. 4. Successful PCI to mid LCx and balloon angioplasty to the  ostium of OM2 with placement of a Promus Premier 3.0 x 16 mm drug-eluting stent with final kissing balloon inflation. 5. Successful PCI to mid RCA with placement of a Promus Premier 3.5 x 28 mm drug-eluting stent.  Recommendations: 1. Dual antiplatelet therapy with aspirin and ticagrelor. The patient is a candidate for the Twilight study evaluating long-term antiplatelet therapy following PCI. 2. Continue aggressive secondary prevention.  Last echo 2014 Study Conclusions  - Left ventricle: The cavity size was normal. Wall thickness was increased in a pattern of mild LVH. Systolic function was normal. The estimated ejection fraction was in the range of 55% to 60%. Wall motion was normal; there were no regional wall motion abnormalities. Doppler parameters are consistent with abnormal left ventricular relaxation (grade 1 diastolic dysfunction). - Right ventricle: The cavity size was mildly dilated. Impressions:  - Technically difficult; definity used.   Past Medical History:  Diagnosis Date  . AAA (abdominal aortic aneurysm) ([place]) 12/15/2014   a. Mild aneurysmal dilatation of the infrarenal abdominal aorta 3.2 cm - f/u due by 2019.  . Arthritis    "all over" (07/30/2016)  . CAD (coronary artery disease)    a. stent to LAD 2000. b. possible spasm by cath 2001. c. IVUS/PTCA/DES to mLAD 05/2013. d. PTCA/DES of dRCA into ostial rPDA 07/2013. e. PTCA of OM2, DES to Cx, DES to [facility] 07/2016.  Marland Kitchen Chronic bronchitis ([place])   . Chronic diastolic CHF (congestive heart failure) ([place])   . Chronic lower back pain   . Chronic neck pain   . CKD (chronic  kidney disease), stage III   . Diabetic peripheral neuropathy ([place])   . Ejection fraction    55%, 07/2010, mild inferior hypo  . GERD (gastroesophageal reflux disease)   . Gout   . H/O diverticulitis of colon 01/31/2015  . H/O hiatal hernia   . History of blood transfusion ~ 10/1940   "had pneumonia"  . HTN (hypertension)   .  Hyperlipidemia   . Melanoma of forearm, left ([place]) 02/2011   with wide excision   . Nephrolithiasis   . Obesity   . OSA on CPAP     [name] since 2000  . Positive D-dimer    a. significant elevation ,hospital 07/2010, etiology unclear. b. D Dimer chronically > 20.  . Skin cancer   . Spinal stenosis    a. s/p surgical repair 2013  . Type II diabetes mellitus ([place])   . Ventral hernia     Past Surgical History:  Procedure Laterality Date  . BACK SURGERY    . CARDIAC CATHETERIZATION N/A 07/30/2016   Procedure: Left Heart Cath and Coronary Angiography;  Surgeon: Nelva Bush, MD;  Location: [facility];  Service: Cardiovascular;  Laterality: N/A;  . CARDIAC CATHETERIZATION N/A 07/30/2016   Procedure: Coronary Stent Intervention;  Surgeon: Nelva Bush, MD;  Location: [facility];  Service: Cardiovascular;  Laterality: N/A;  Mid CFX and MID RCA  . CARDIAC CATHETERIZATION N/A 07/30/2016   Procedure: Coronary Balloon Angioplasty;  Surgeon: Nelva Bush, MD;  Location: [facility];  Service: Cardiovascular;  Laterality: N/A;  OM 1  . CARPAL TUNNEL RELEASE Left   . CATARACT EXTRACTION W/ INTRAOCULAR LENS  IMPLANT, BILATERAL Bilateral ~ 2014  . CERVICAL DISC SURGERY  1990s  . CORONARY ANGIOPLASTY WITH STENT PLACEMENT  05/2013  . CORONARY ANGIOPLASTY WITH STENT PLACEMENT  07/26/2013   DES to RCA extending to PDA    . CORONARY ANGIOPLASTY WITH STENT PLACEMENT  07/30/2016  . CORONARY ANGIOPLASTY WITH STENT PLACEMENT  2000   CAD  . DENTAL SURGERY  04/2016   "got infected; had to dig it out"  . KNEE ARTHROSCOPY Right 1990's   rt  . LEFT AND RIGHT HEART CATHETERIZATION WITH CORONARY ANGIOGRAM N/A 07/26/2013   Procedure: LEFT AND RIGHT HEART CATHETERIZATION WITH CORONARY ANGIOGRAM;  Surgeon: [name], MD;  Location: [facility] CATH LAB;  Service: Cardiovascular;  Laterality: N/A;  . LEFT HEART CATHETERIZATION WITH CORONARY ANGIOGRAM N/A 05/19/2013   Procedure: LEFT  HEART CATHETERIZATION WITH CORONARY ANGIOGRAM;  Surgeon: [name], MD;  Location: [facility] CATH LAB;  Service: Cardiovascular;  Laterality: N/A;  . LUMBAR LAMINECTOMY/DECOMPRESSION MICRODISCECTOMY  08/30/2012   Procedure: LUMBAR LAMINECTOMY/DECOMPRESSION MICRODISCECTOMY 2 LEVELS;  Surgeon: [name], MD;  Location: [facility];  Service: Neurosurgery;  Laterality: Bilateral;  Bilateral Lumbar three-four Lumbar four-five Laminotomies  . LUMBAR SPINE SURGERY  04/07/2016   Dr. Ellene Route; "?ruptured disc"  . MELANOMA EXCISION Left 02/2011   forearm  . NASAL SINUS SURGERY  1970s   "cut windows in sinus pockets"  . PERCUTANEOUS CORONARY STENT INTERVENTION (PCI-S)  05/19/2013   Procedure: PERCUTANEOUS CORONARY STENT INTERVENTION (PCI-S);  Surgeon: [name], MD;  Location[facility] C[facility] LAB;  Service: Cardiovascular;;  . ULNAR TUNNEL RELEASE Left 04/07/2016   Dr. Ellene Route    Family History  Problem Relation Age of Onset  . Cancer Mother     intestinal   . Heart disease Mother   . Cancer Father     prostate  .  Migraines Daughter   . Leukemia Sister   . Heart disease Sister   . Prostate cancer    . Kidney cancer    . Cancer      Bladder cancer  . Coronary artery disease    . Hypertension Sister   . Hypertension Brother   . Heart attack Neg Hx   . Stroke Neg Hx    Social History:  reports that he quit smoking about 19 years ago. His smoking use included Cigarettes. He has a 90.00 pack-year smoking history. He has never used smokeless tobacco. He reports that he does not drink alcohol or use drugs.  Allergies:  Allergies  Allergen Reactions  . Levemir [Insulin Detemir] Swelling    Patient had redness and swelling and tenderness at injection site.  . Codeine Other (See Comments)    Makes him "shakey", is OK with hydrocodone GI UPSET & TREMORS  . Lipitor [Atorvastatin] Other (See Comments)    Muscle aches; liver functions. Patient is currently taken  . Novolog Mix [Insulin Aspart Prot  & Aspart]     Causes skin to swell/itch at injection site    No current facility-administered medications on file prior to encounter.    Current Outpatient Prescriptions on File Prior to Encounter  Medication Sig Dispense Refill  . acetaminophen (TYLENOL) 500 MG tablet Take 1,000 mg by mouth 2 (two) times daily.    Marland Kitchen albuterol (PROVENTIL HFA;VENTOLIN HFA) 108 (90 BASE) MCG/ACT inhaler Inhale 2 puffs into the lungs every 6 (six) hours as needed for wheezing or shortness of breath. 18 Inhaler 2  . allopurinol (ZYLOPRIM) 300 MG tablet TAKE 1 TABLET EVERY DAY 90 tablet 1  . aspirin EC 81 MG tablet Take 81 mg by mouth daily.    Marland Kitchen atorvastatin (LIPITOR) 40 MG tablet Take 1 tablet (40 mg total) by mouth daily. 90 tablet 0  . B Complex Vitamins (B COMPLEX-B12 PO) Take 1 tablet by mouth at bedtime.    . Cholecalciferol (VITAMIN D) 1000 UNITS capsule Take 1,000 Units by mouth at bedtime.     . cyclobenzaprine (FLEXERIL) 5 MG tablet Take 1 tablet (5 mg total) by mouth 3 (three) times daily as needed for muscle spasms. (Patient taking differently: Take 5 mg by mouth 2 (two) times daily as needed for muscle spasms. ) 30 tablet 1  . fenofibrate 160 MG tablet TAKE 1 TABLET EVERY DAY (Patient taking differently: TAKE 1 TABLET EVERY night) 90 tablet 1  . fluocinonide cream (LIDEX) 1.10 % Apply 1 application topically daily as needed. FOR RASH    . fluticasone (FLONASE) 50 MCG/ACT nasal spray Place 2 sprays into both nostrils daily. (Patient taking differently: Place 2 sprays into both nostrils as needed. ) 16 g 1  . furosemide (LASIX) 80 MG tablet Take 0.5 tablets (40 mg total) by mouth daily. 45 tablet 1  . insulin NPH Human (NOVOLIN N) 100 UNIT/ML injection Inject 40 units in am and 35 units in pm (Patient taking differently: Inject 40 Units into the skin 2 (two) times daily before a meal. ) 70 mL 1  . insulin regular (NOVOLIN R RELION) 100 units/mL injection Inject 0.25-0.3 mLs (25-30 Units total) into the  skin 3 (three) times daily before meals. (Patient taking differently: Inject 30 Units into the skin 3 (three) times daily before meals. ) 70 mL 1  . isosorbide mononitrate (IMDUR) 30 MG 24 hr tablet TAKE 1 TABLET EVERY DAY 90 tablet 3  . metoprolol (LOPRESSOR) 50 MG tablet  TAKE 1 TABLET TWICE DAILY 180 tablet 1  . nitroGLYCERIN (NITROSTAT) 0.4 MG SL tablet Place 1 tablet (0.4 mg total) under the tongue every 5 (five) minutes as needed for chest pain. 25 tablet 3  . pantoprazole (PROTONIX) 20 MG tablet Take 1 tablet (20 mg total) by mouth daily. 90 tablet 3  . tamsulosin (FLOMAX) 0.4 MG CAPS capsule TAKE 1 CAPSULE EVERY DAY 90 capsule 1  . ticagrelor (BRILINTA) 90 MG TABS tablet Take 1 tablet (90 mg total) by mouth 2 (two) times daily. 180 tablet 3    Results for orders placed or performed during the hospital encounter of 08/01/16 (from the past 48 hour(s))  Troponin I     Status: Abnormal   Collection Time: 08/01/16 10:10 PM  Result Value Ref Range   Troponin I 0.10 (HH) <0.03 ng/mL    Comment: CRITICAL RESULT CALLED TO, READ BACK BY AND VERIFIED WITH: MAYNARD,C,RN @ 2240 08/01/16 BY GWYN,P   Basic metabolic panel     Status: Abnormal   Collection Time: 08/01/16 10:10 PM  Result Value Ref Range   Sodium 139 135 - 145 mmol/L   Potassium 3.8 3.5 - 5.1 mmol/L   Chloride 106 101 - 111 mmol/L   CO2 23 22 - 32 mmol/L   Glucose, Bld 138 (H) 65 - 99 mg/dL   BUN 28 (H) 6 - 20 mg/dL   Creatinine, Ser 1.78 (H) 0.61 - 1.24 mg/dL   Calcium 9.9 8.9 - 10.3 mg/dL   GFR calc non Af Amer 35 (L) >60 mL/min   GFR calc Af Amer 41 (L) >60 mL/min    Comment: (NOTE) The eGFR has been calculated using the CKD EPI equation. This calculation has not been validated in all clinical situations. eGFR's persistently <60 mL/min signify possible Chronic Kidney Disease.    Anion gap 10 5 - 15  CBC with Differential     Status: None   Collection Time: 08/01/16 10:10 PM  Result Value Ref Range   WBC 10.1 4.0 -  10.5 K/uL   RBC 4.35 4.22 - 5.81 MIL/uL   Hemoglobin 13.9 13.0 - 17.0 g/dL   HCT 40.6 39.0 - 52.0 %   MCV 93.3 78.0 - 100.0 fL   MCH 32.0 26.0 - 34.0 pg   MCHC 34.2 30.0 - 36.0 g/dL   RDW 14.1 11.5 - 15.5 %   Platelets 177 150 - 400 K/uL   Neutrophils Relative % 60 %   Neutro Abs 6.0 1.7 - 7.7 K/uL   Lymphocytes Relative 29 %   Lymphs Abs 3.0 0.7 - 4.0 K/uL   Monocytes Relative 9 %   Monocytes Absolute 0.9 0.1 - 1.0 K/uL   Eosinophils Relative 2 %   Eosinophils Absolute 0.2 0.0 - 0.7 K/uL   Basophils Relative 0 %   Basophils Absolute 0.0 0.0 - 0.1 K/uL  Glucose, capillary     Status: Abnormal   Collection Time: 08/02/16  2:18 AM  Result Value Ref Range   Glucose-Capillary 139 (H) 65 - 99 mg/dL   Dg Chest 2 View  Result Date: 08/01/2016 CLINICAL DATA:  Acute acute onset worsening chest pain for 2 days. History of CHF, hypertension, diabetes. EXAM: CHEST  2 VIEW COMPARISON:  Chest radiograph July 30, 2016 FINDINGS: The cardiac silhouette is mildly enlarged and unchanged. Coronary artery stent. Mildly calcified aortic knob. No pleural effusion or focal consolidation. No pneumothorax. Soft tissue planes included osseous structures are nonsuspicious unchanged, moderate degenerative change of thoracic spine.  IMPRESSION: Mild cardiomegaly, no acute pulmonary process. Electronically Signed   By: Elon Alas M.D.   On: 08/01/2016 23:12    ECG/Tele: Tachycardia, sinus, inc RBBB with poor r wave progression and prominent R wave aVR  ROS: As above. Otherwise, review of systems is negative unless per above HPI  Vitals:   08/02/16 0000 08/02/16 0015 08/02/16 0045 08/02/16 0100  BP: 125/72 129/68 134/59 123/97  Pulse: 102 102 103 104  Resp: _0 Temp:      SpO2: 97% 98% 97% 98%  Weight:      Height:       Wt Readings from Last 10 Encounters:  08/01/16 121.6 kg (268 lb)  07/31/16 121.7 kg (268 lb 4.8 oz)  05/28/16 119.3 kg (263 lb)  05/15/16 119.4 kg (263 lb 4 oz)    03/05/16 121.3 kg (267 lb 6.4 oz)  02/26/16 120.2 kg (265 lb)  12/25/15 117 kg (258 lb)  11/28/15 120.7 kg (266 lb)  10/09/15 120.7 kg (266 lb 3.2 oz)  10/01/15 119.3 kg (263 lb)    PE:  General: No acute distress, dyspneic HEENT: Atraumatic, EOMI, mucous membranes moist CV: RRR faint SEM, gallops. No JVD at 90 degrees. No HJR. Respiratory: Clear, no crackles or wheezing. Dyspneic ABD: Non-distended and non-tender. No palpable organomegaly.  Extremities: 2+ radial pulses bilaterally. No LE edema. Warm.  Neuro/Psych: CN grossly intact, alert and oriented  Assessment/Plan Shortness of breath -- unclear etiology differential includes Brillinta-induced dyspnea, heart failure, pulmonay embolism less likely infection CAD s/p multiple PCI and recent PCI to RCA and LCx and OM-- Mildly elevated troponin CKD HTN HLD DM COPD OSA   Shortness of breath -- unclear etiology differential includes Brillinta-induced dyspnea, heart failure, pulmonay embolism less likely infection.  Not an impressive fluid exam, no real LE edema or crackles on lungs.  No wheezing. Weights stable.  Tachy on exam.  - TTE - pro BNP ordered - LE PVLs, D Dimer.  Would likely need V/Q scan to w/u PE given CKD and recent contrast exposure - If workup unrevealing, consider switching Brillinta to prasugrel / clopidogrel  CAD s/p multiple PCI and recent PCI to RCA and LCx and OM-- Mildly elevated troponin - Continue ASA, Brillinta as above. - Continue Atorva, fibrate, imdur - Daily ECG - Repeat troponin to ensure down trending with CK/MB - TTE as above - Tachy on exam, but warm.  Will not increase BB until TTE done for EF.    CKD - Elevated from recent cath, but within previous range.  CTM  DM - SSI ordered; pharmacy consult to help with mealtime insulin dosing   OSA - home CPAP  William Hendrix William Krupka  MD 08/02/2016, 2:35 AM

## 2016-08-02 NOTE — Progress Notes (Signed)
Patient Name: William Hendrix Date of Encounter: 08/02/2016  Primary Cardiologist: [facility] Problem List     Active Problems:   CAD (coronary artery disease)   Essential hypertension   SOB (shortness of breath)   Hypertriglyceridemia   Uncontrolled type 2 diabetes mellitus with circulatory disorder, with long-term current use of insulin (HCC)   CKD (chronic kidney disease), stage III   Dyspnea   Chest pain     Subjective   75 year old man with history of coronary artery disease status post prior PCI's to the LAD and RCA, AAA, diabetes, hypertension, history of sleep apnea, chronic kidney disease, who presented to the ER after developing acute onset of chest pain initially while walking but persistent at rest while at [employer]  Was found to have an elevated D-dimer level. Has had an echocardiogram as well as a V/Q scan.  Inpatient Medications    Scheduled Meds: . allopurinol  300 mg Oral Daily  . aspirin EC  81 mg Oral Daily  . atorvastatin  40 mg Oral q1800  . cholecalciferol  1,000 Units Oral QHS  . fenofibrate  160 mg Oral Daily  . furosemide  40 mg Oral Daily  . heparin  1,500 Units Intravenous Once  . insulin lispro  0-15 Units Subcutaneous TID WC  . insulin NPH Human  35 Units Subcutaneous QHS  . insulin NPH Human  40 Units Subcutaneous QAC breakfast  . isosorbide mononitrate  30 mg Oral Daily  . metoprolol  50 mg Oral BID  . pantoprazole  20 mg Oral Daily  . tamsulosin  0.4 mg Oral Daily  . ticagrelor  90 mg Oral BID   Continuous Infusions: . heparin     PRN Meds: acetaminophen, albuterol, fluticasone, nitroGLYCERIN, ondansetron (ZOFRAN) IV, technetium TC 75M diethylenetriame-pentaacetic acid   Vital Signs    Vitals:   08/02/16 0100 08/02/16 0153 08/02/16 0332 08/02/16 0733  BP: 123/97 (!) 153/77 137/80 130/76  Pulse: 104 (!) 107 (!) 105 96  Resp: 21  20 (!) 21  Temp:   99.1 F (37.3 C) 98.9 F (37.2 C)  TempSrc:   Oral Oral  SpO2:  98%  97% 97%  Weight:   268 lb 14.4 oz (122 kg)   Height:        Intake/Output Summary (Last 24 hours) at 08/02/16 1132 Last data filed at 08/02/16 0400  Gross per 24 hour  Intake            52.27 ml  Output                0 ml  Net            52.27 ml   Filed Weights   08/01/16 2125 08/02/16 0332  Weight: 268 lb (121.6 kg) 268 lb 14.4 oz (122 kg)    Physical Exam   GEN:  Obese,  Has nasal pillows / CPAP in  in no acute distress.  HEENT: Grossly normal.  Neck: Supple, no JVD, carotid bruits, or masses. Cardiac: RRR, no murmurs, rubs, or gallops. No clubbing, cyanosis, edema.  Radials/DP/PT 2+ and equal bilaterally.  Respiratory:  Respirations regular and unlabored, clear to auscultation bilaterally. GI: Soft, nontender, nondistended, BS + x 4. MS: no deformity or atrophy. Skin: warm and dry, no rash. Neuro:  Strength and sensation are intact. Psych: AAOx3.  Normal affect.  Labs    CBC  Recent Labs  08/01/16 2210 08/02/16 0239  WBC 10.1 10.6*  NEUTROABS  6.0  --   HGB 13.9 13.9  HCT 40.6 40.9  MCV 93.3 91.5  PLT 177 [id number]   Basic Metabolic Panel  Recent Labs  08/01/16 2210 08/02/16 0239  NA 139 139  K 3.8 3.7  CL 106 106  CO2 23 23  GLUCOSE 138* 149*  BUN 28* 26*  CREATININE 1.78* 1.55*  CALCIUM 9.9 9.9  MG  --  1.8   Liver Function Tests No results for input(s): AST, ALT, ALKPHOS, BILITOT, PROT, ALBUMIN in the last 72 hours. No results for input(s): LIPASE, AMYLASE in the last 72 hours. Cardiac Enzymes  Recent Labs  08/01/16 2210 08/02/16 0239 08/02/16 0951  CKTOTAL  --  145  --   CKMB  --  2.2  --   TROPONINI 0.10* 0.09* 0.07*   BNP Invalid input(s): POCBNP D-Dimer  Recent Labs  08/02/16 0239  DDIMER >20.00*   Hemoglobin A1C No results for input(s): HGBA1C in the last 72 hours. Fasting Lipid Panel No results for input(s): CHOL, HDL, LDLCALC, TRIG, CHOLHDL, LDLDIRECT in the last 72 hours. Thyroid Function Tests No results for  input(s): TSH, T4TOTAL, T3FREE, THYROIDAB in the last 72 hours.  Invalid input(s): FREET3  Telemetry    NSR , no ST or T  Wave changes.  - Personally Reviewed  ECG     Personally Reviewed - sinus tach at 111.   No ST or T wave changs   Radiology    Dg Chest 2 View  Result Date: 08/01/2016 CLINICAL DATA:  Acute acute onset worsening chest pain for 2 days. History of CHF, hypertension, diabetes. EXAM: CHEST  2 VIEW COMPARISON:  Chest radiograph July 30, 2016 FINDINGS: The cardiac silhouette is mildly enlarged and unchanged. Coronary artery stent. Mildly calcified aortic knob. No pleural effusion or focal consolidation. No pneumothorax. Soft tissue planes included osseous structures are nonsuspicious unchanged, moderate degenerative change of thoracic spine. IMPRESSION: Mild cardiomegaly, no acute pulmonary process. Electronically Signed   By: Elon Alas M.D.   On: 08/01/2016 23:12   Nm Pulmonary Perf And Vent  Result Date: 08/02/2016 CLINICAL DATA:  Chest pain and shortness of Breath EXAM: NUCLEAR MEDICINE VENTILATION - PERFUSION LUNG SCAN TECHNIQUE: Ventilation images were obtained in multiple projections using inhaled aerosol Tc-67m DTPA. Perfusion images were obtained in multiple projections after intravenous injection of Tc-28m MAA. RADIOPHARMACEUTICALS:  32 mCi Technetium-50m DTPA aerosol inhalation and 4.2 mCi Technetium-88m MAA IV COMPARISON:  None. FINDINGS: Ventilation: No focal ventilation defect. Perfusion: No wedge shaped peripheral perfusion defects to suggest acute pulmonary embolism. IMPRESSION: No evidence of ventilation perfusion mismatch to suggest pulmonary embolism. Electronically Signed   By: Inez Catalina M.D.   On: 08/02/2016 10:19    Cardiac Studies   Cath  OCT. 10 - LCx - 80% mid stenosis -> promus premier 3.0 x 16 DES RCA - promus Premier 3.5 x 20  DES    Patient Profile    Assessment & Plan    1. Chest pain: The patient presents several days  after PCI. He also was found have a positive d-dimer level. The wife tells me that he's had an elevated d-dimer in the past and workup was negative. History: Levels are very minimally elevated and are more likely related to his PCI from 4 days ago. His EKG does not show any ST or T wave changes.  VQ scan was performed and is currently pending.  If he's found to have a pulmonary and was then we will need to change  the Brilinta to Plavix. We'll treat him with either Eliquis or Xarelto at the dose indicated for pulmonary embolus.    Signed, Mertie Moores, MD  08/02/2016, 11:32 AM

## 2016-08-02 NOTE — Progress Notes (Signed)
ANTICOAGULATION CONSULT NOTE - Initial Consult  Pharmacy Consult for Heparin Indication: chest pain/ACS  Allergies  Allergen Reactions  . Levemir [Insulin Detemir] Swelling    Patient had redness and swelling and tenderness at injection site.  . Codeine Other (See Comments)    Makes him "shakey", is OK with hydrocodone GI UPSET & TREMORS  . Lipitor [Atorvastatin] Other (See Comments)    Muscle aches; liver functions. Patient is currently taken  . Novolog Mix [Insulin Aspart Prot & Aspart]     Causes skin to swell/itch at injection site    Patient Measurements: Height: 5\' 10"  (177.8 cm) Weight: 268 lb 14.4 oz (122 kg) IBW/kg (Calculated) : 73 Heparin Dosing Weight: 100 kg  Vital Signs: Temp: 98.9 F (37.2 C) (10/22 0733) Temp Source: Oral (10/22 0733) BP: 130/76 (10/22 0733) Pulse Rate: 96 (10/22 0733)  Labs:  Recent Labs  07/31/16 0037 08/01/16 2210 08/02/16 0239 08/02/16 0951  HGB 13.6 13.9 13.9  --   HCT 40.9 40.6 40.9  --   PLT 149* 177 163  --   HEPARINUNFRC  --   --   --  0.25*  CREATININE 1.33* 1.78* 1.55*  --   CKTOTAL  --   --  145  --   CKMB  --   --  2.2  --   TROPONINI 0.17* 0.10* 0.09* 0.07*    Estimated Creatinine Clearance: 53.1 mL/min (by C-G formula based on SCr of 1.55 mg/dL (H)).   Medical History: Past Medical History:  Diagnosis Date  . AAA (abdominal aortic aneurysm) ([place]) 12/15/2014   a. Mild aneurysmal dilatation of the infrarenal abdominal aorta 3.2 cm - f/u due by 2019.  . Arthritis    "all over" (07/30/2016)  . CAD (coronary artery disease)    a. stent to LAD 2000. b. possible spasm by cath 2001. c. IVUS/PTCA/DES to mLAD 05/2013. d. PTCA/DES of dRCA into ostial rPDA 07/2013. e. PTCA of OM2, DES to Cx, DES to [facility] 07/2016.  Marland Kitchen Chronic bronchitis ([place])   . Chronic diastolic CHF (congestive heart failure) ([place])   . Chronic lower back pain   . Chronic neck pain   . CKD (chronic kidney disease), stage III   . Diabetic peripheral  neuropathy ([place])   . Ejection fraction    55%, 07/2010, mild inferior hypo  . GERD (gastroesophageal reflux disease)   . Gout   . H/O diverticulitis of colon 01/31/2015  . H/O hiatal hernia   . History of blood transfusion ~ 10/1940   "had pneumonia"  . HTN (hypertension)   . Hyperlipidemia   . Melanoma of forearm, left ([place]) 02/2011   with wide excision   . Nephrolithiasis   . Obesity   . OSA on CPAP     [name] since 2000  . Positive D-dimer    a. significant elevation ,hospital 07/2010, etiology unclear. b. D Dimer chronically > 20.  . Skin cancer   . Spinal stenosis    a. s/p surgical repair 2013  . Type II diabetes mellitus ([place])   . Ventral hernia     Medications:  See electronic med rec  Assessment: 75 y.o. M presents to [facility] with CP. Troponin up to 0.1. To begin heparin for ACS.   8-hour heparin level came back subtherapeutic at 0.025 units/mL. Per RN, the drip has been running the whole time and no signs/symptoms of bleeding. Hemoglobin and platelet counts were okay on admission.   Goal of Therapy:  Heparin level 0.3-0.7  units/ml Monitor platelets by anticoagulation protocol: Yes   Plan:  Re-bolus with Heparin 1500 units IV x1 Increase Heparin infusion to 1600 IV units/hr  Repeat heparin level in 8 hours Daily heparin level and CBC  Demetrius Charity, PharmD Acute Care Pharmacy Resident  Pager: [phone] 08/02/2016

## 2016-08-02 NOTE — Progress Notes (Signed)
Dr. Acie Fredrickson notified of pt allergies to Levemir and Novolog. Pt states he takes Novolin N 40 units twice a day with breakfast and supper, and Novolin Regular Insulin 30 units three times a day with meals. Insulin ordered per home med dosing per Dr. Acie Fredrickson. Med Reconciliation of Home Med dosing of insulin verified with pt and his wife.

## 2016-08-03 ENCOUNTER — Encounter (HOSPITAL_COMMUNITY): Payer: Self-pay | Admitting: Student

## 2016-08-03 ENCOUNTER — Inpatient Hospital Stay (HOSPITAL_COMMUNITY): Payer: Commercial Managed Care - HMO

## 2016-08-03 DIAGNOSIS — L8990 Pressure ulcer of unspecified site, unspecified stage: Secondary | ICD-10-CM | POA: Insufficient documentation

## 2016-08-03 DIAGNOSIS — R071 Chest pain on breathing: Secondary | ICD-10-CM

## 2016-08-03 DIAGNOSIS — E781 Pure hyperglyceridemia: Secondary | ICD-10-CM

## 2016-08-03 DIAGNOSIS — I509 Heart failure, unspecified: Secondary | ICD-10-CM

## 2016-08-03 DIAGNOSIS — R0602 Shortness of breath: Secondary | ICD-10-CM

## 2016-08-03 DIAGNOSIS — N183 Chronic kidney disease, stage 3 (moderate): Secondary | ICD-10-CM

## 2016-08-03 LAB — ECHOCARDIOGRAM COMPLETE
Height: 70 in
Weight: 4086.4 oz

## 2016-08-03 LAB — GLUCOSE, CAPILLARY
GLUCOSE-CAPILLARY: 141 mg/dL — AB (ref 65–99)
GLUCOSE-CAPILLARY: 118 mg/dL — AB (ref 65–99)
Glucose-Capillary: 246 mg/dL — ABNORMAL HIGH (ref 65–99)
Glucose-Capillary: 109 mg/dL — ABNORMAL HIGH (ref 65–99)

## 2016-08-03 LAB — CBC
HEMATOCRIT: 40.4 % (ref 39.0–52.0)
HEMOGLOBIN: 13.6 g/dL (ref 13.0–17.0)
MCH: 30.9 pg (ref 26.0–34.0)
MCHC: 33.7 g/dL (ref 30.0–36.0)
MCV: 91.8 fL (ref 78.0–100.0)
Platelets: 180 10*3/uL (ref 150–400)
RBC: 4.4 MIL/uL (ref 4.22–5.81)
RDW: 14 % (ref 11.5–15.5)
WBC: 10 10*3/uL (ref 4.0–10.5)

## 2016-08-03 LAB — HEPARIN LEVEL (UNFRACTIONATED): Heparin Unfractionated: 0.35 IU/mL (ref 0.30–0.70)

## 2016-08-03 MED ORDER — ENOXAPARIN SODIUM 40 MG/0.4ML ~~LOC~~ SOLN
40.0000 mg | SUBCUTANEOUS | Status: DC
  Administered 2016-08-04: 40 mg via SUBCUTANEOUS
  Filled 2016-08-03: qty 0.4

## 2016-08-03 MED ORDER — PRASUGREL HCL 10 MG PO TABS
10.0000 mg | ORAL_TABLET | Freq: Every day | ORAL | Status: DC
  Administered 2016-08-04: 10 mg via ORAL
  Filled 2016-08-03: qty 1

## 2016-08-03 MED ORDER — PERFLUTREN LIPID MICROSPHERE
INTRAVENOUS | Status: AC
  Filled 2016-08-03: qty 10

## 2016-08-03 MED ORDER — PRASUGREL HCL 10 MG PO TABS: 10.0000 mg | ORAL_TABLET | Freq: Two times a day (BID) | ORAL | Status: DC

## 2016-08-03 MED ORDER — PERFLUTREN LIPID MICROSPHERE
1.0000 mL | INTRAVENOUS | Status: AC | PRN
  Administered 2016-08-03: 2 mL via INTRAVENOUS
  Filled 2016-08-03: qty 10

## 2016-08-03 MED ORDER — INFLUENZA VAC SPLIT QUAD 0.5 ML IM SUSY
0.5000 mL | PREFILLED_SYRINGE | INTRAMUSCULAR | Status: AC | PRN
  Administered 2016-08-04: 0.5 mL via INTRAMUSCULAR
  Filled 2016-08-03: qty 0.5

## 2016-08-03 MED ORDER — LORATADINE 10 MG PO TABS
10.0000 mg | ORAL_TABLET | Freq: Every day | ORAL | Status: DC
  Administered 2016-08-03 – 2016-08-04 (×2): 10 mg via ORAL
  Filled 2016-08-03 (×2): qty 1

## 2016-08-03 MED ORDER — PRASUGREL HCL 10 MG PO TABS
30.0000 mg | ORAL_TABLET | Freq: Once | ORAL | Status: AC
  Administered 2016-08-03: 30 mg via ORAL
  Filled 2016-08-03: qty 3

## 2016-08-03 NOTE — Progress Notes (Signed)
Echocardiogram 2D Echocardiogram has been performed.  Aggie Cosier 08/03/2016, 3:23 PM

## 2016-08-03 NOTE — Progress Notes (Signed)
ANTICOAGULATION CONSULT NOTE - Follow Up Consult  Pharmacy Consult for Heparin > change to Lovenox for DVT prophylaxis Indication: chest pain/ACS  Allergies  Allergen Reactions  . Levemir [Insulin Detemir] Swelling    Patient had redness and swelling and tenderness at injection site.  . Codeine Other (See Comments)    Makes him "shakey", is OK with hydrocodone GI UPSET & TREMORS  . Lipitor [Atorvastatin] Other (See Comments)    Muscle aches; liver functions. Patient is currently taken  . Novolog Mix [Insulin Aspart Prot & Aspart]     Causes skin to swell/itch at injection site    Patient Measurements: Height: 5\' 10"  (177.8 cm) Weight: 255 lb 6.4 oz (115.8 kg) IBW/kg (Calculated) : 73 Heparin Dosing Weight: 100 kg  Vital Signs: Temp: 98.1 F (36.7 C) (10/23 0810) Temp Source: Oral (10/23 0810) BP: 118/72 (10/23 0810) Pulse Rate: 91 (10/23 0810)  Labs:  Recent Labs  08/01/16 2210 08/02/16 0239 08/02/16 0951 08/02/16 1535 08/02/16 2046 08/03/16 0409  HGB 13.9 13.9  --   --   --  13.6  HCT 40.6 40.9  --   --   --  40.4  PLT 177 163  --   --   --  180  HEPARINUNFRC  --   --  0.25*  --  0.34 0.35  CREATININE 1.78* 1.55*  --   --   --   --   CKTOTAL  --  145  --   --   --   --   CKMB  --  2.2  --   --   --   --   TROPONINI 0.10* 0.09* 0.07* 0.06*  --   --     Estimated Creatinine Clearance: 51.7 mL/min (by C-G formula based on SCr of 1.55 mg/dL (H)).   Medical History: Past Medical History:  Diagnosis Date  . AAA (abdominal aortic aneurysm) ([place]) 12/15/2014   a. Mild aneurysmal dilatation of the infrarenal abdominal aorta 3.2 cm - f/u due by 2019.  . Arthritis    "all over" (07/30/2016)  . CAD (coronary artery disease)    a. stent to LAD 2000. b. possible spasm by cath 2001. c. IVUS/PTCA/DES to mLAD 05/2013. d. PTCA/DES of dRCA into ostial rPDA 07/2013. e. PTCA of OM2, DES to Cx, DES to [facility] 07/2016.  Marland Kitchen Chronic bronchitis ([place])   . Chronic diastolic CHF  (congestive heart failure) ([place])   . Chronic lower back pain   . Chronic neck pain   . CKD (chronic kidney disease), stage III   . Diabetic peripheral neuropathy ([place])   . Ejection fraction    55%, 07/2010, mild inferior hypo  . GERD (gastroesophageal reflux disease)   . Gout   . H/O diverticulitis of colon 01/31/2015  . H/O hiatal hernia   . History of blood transfusion ~ 10/1940   "had pneumonia"  . HTN (hypertension)   . Hyperlipidemia   . Melanoma of forearm, left ([place]) 02/2011   with wide excision   . Nephrolithiasis   . Obesity   . OSA on CPAP     [name] since 2000  . Positive D-dimer    a. significant elevation ,hospital 07/2010, etiology unclear. b. D Dimer chronically > 20.  . Skin cancer   . Spinal stenosis    a. s/p surgical repair 2013  . Type II diabetes mellitus ([place])   . Ventral hernia     Assessment: 75 y.o. M presents to [facility] with CP. Troponin  up to 0.1. To begin heparin for ACS.   Heparin drip rate 1600 uts/hr  heparin level therapeutic at 0.35.  VQ scan negative.  CBC stable, no bleeding noted  Goal of Therapy:  Heparin level 0.3-0.7 units/ml Monitor platelets by anticoagulation protocol: Yes   Plan:  Per orders from Dr. Tamala Julian, d/c heparin now. Start Lovenox for DVT prophylaxis tomorrow AM. Will give Lovenox 40 mg sq daily. Pharmacy will sign off, please contact if questions.  Thanks!  William Hendrix, BCPS  Clinical Pharmacist Pager [phone]  08/03/2016 11:12 AM

## 2016-08-03 NOTE — Progress Notes (Signed)
Patient Name: William Hendrix Date of Encounter: 08/03/2016  Primary Cardiologist: [facility] Problem List     Principal Problem:   Chest pain Active Problems:   SOB (shortness of breath)   CAD (coronary artery disease)   Essential hypertension   Hypertriglyceridemia   Uncontrolled type 2 diabetes mellitus with circulatory disorder, with long-term current use of insulin (HCC)   CKD (chronic kidney disease), stage III   Dyspnea     Subjective   + SOB and complains of pain all over, freq prod cough and nasal drainage with mucus and blood, not having the same type of chest pain he was admitted with 07/30/16     Inpatient Medications    Scheduled Meds: . allopurinol  300 mg Oral Daily  . aspirin EC  81 mg Oral Daily  . atorvastatin  40 mg Oral q1800  . cholecalciferol  1,000 Units Oral QHS  . fenofibrate  160 mg Oral Daily  . furosemide  40 mg Oral Daily  . [START ON 08/04/2016] Influenza vac split quadrivalent PF  0.5 mL Intramuscular Tomorrow-1000  . insulin NPH Human  40 Units Subcutaneous QAC breakfast  . insulin NPH Human  40 Units Subcutaneous QAC supper  . insulin regular  30 Units Subcutaneous TID WC  . isosorbide mononitrate  30 mg Oral Daily  . metoprolol  50 mg Oral BID  . pantoprazole  20 mg Oral Daily  . tamsulosin  0.4 mg Oral Daily  . ticagrelor  90 mg Oral BID   Continuous Infusions: . heparin 1,600 Units/hr (08/03/16 0734)   PRN Meds: acetaminophen, albuterol, fluticasone, nitroGLYCERIN, ondansetron (ZOFRAN) IV, technetium TC 78M diethylenetriame-pentaacetic acid   Vital Signs    Vitals:   08/02/16 2016 08/03/16 0036 08/03/16 0555 08/03/16 0810  BP: 131/63 122/69 119/68 118/72  Pulse: 94 85 94 91  Resp: 19 17 18 15   Temp: 99 F (37.2 C) 98.4 F (36.9 C) 99 F (37.2 C) 98.1 F (36.7 C)  TempSrc: Oral Oral Oral Oral  SpO2: 97% 99% 94% 96%  Weight:   255 lb 6.4 oz (115.8 kg)   Height:        Intake/Output Summary (Last 24 hours) at  08/03/16 0840 Last data filed at 08/03/16 0800  Gross per 24 hour  Intake            277.6 ml  Output              390 ml  Net           -112.4 ml   Filed Weights   08/01/16 2125 08/02/16 0332 08/03/16 0555  Weight: 268 lb (121.6 kg) 268 lb 14.4 oz (122 kg) 255 lb 6.4 oz (115.8 kg)    Physical Exam    GEN: Well nourished, well developed, in no acute distress.  Low grade fever HEENT: Grossly normal.  Neck: Supple, no JVD, or masses. Cardiac: RRR, rapid easily,  no murmurs, rubs, or gallops. No clubbing, cyanosis, edema.  Radials 2+ and equal bilaterally.  Respiratory:  Respirations, rapid with any exertion, regular and labored with exertion, diminished breath sounds but no wheezes or gallops GI: obese, soft, nontender, nondistended, BS + x 4. MS: no deformity or atrophy. Skin: warm and dry, no rash. Neuro:   AAOx3 MAE, follows commands Psych:.  Normal affect.  Labs    CBC  Recent Labs  08/01/16 2210 08/02/16 0239 08/03/16 0409  WBC 10.1 10.6* 10.0  NEUTROABS 6.0  --   --  HGB 13.9 13.9 13.6  HCT 40.6 40.9 40.4  MCV 93.3 91.5 91.8  PLT 177 163 [id number]   Basic Metabolic Panel  Recent Labs  08/01/16 2210 08/02/16 0239  NA 139 139  K 3.8 3.7  CL 106 106  CO2 23 23  GLUCOSE 138* 149*  BUN 28* 26*  CREATININE 1.78* 1.55*  CALCIUM 9.9 9.9  MG  --  1.8   Liver Function Tests No results for input(s): AST, ALT, ALKPHOS, BILITOT, PROT, ALBUMIN in the last 72 hours. No results for input(s): LIPASE, AMYLASE in the last 72 hours. Cardiac Enzymes  Recent Labs  08/02/16 0239 08/02/16 0951 08/02/16 1535  CKTOTAL 145  --   --   CKMB 2.2  --   --   TROPONINI 0.09* 0.07* 0.06*   BNP Invalid input(s): POCBNP D-Dimer  Recent Labs  08/02/16 0239  DDIMER >20.00*   Hemoglobin A1C No results for input(s): HGBA1C in the last 72 hours. Fasting Lipid Panel No results for input(s): CHOL, HDL, LDLCALC, TRIG, CHOLHDL, LDLDIRECT in the last 72 hours. Thyroid Function  Tests No results for input(s): TSH, T4TOTAL, T3FREE, THYROIDAB in the last 72 hours.  Invalid input(s): FREET3  Telemetry    SR with occ PVC - Personally Reviewed  ECG    EKG with SR no changes from recent stent  - Personally Reviewed  Radiology    Dg Chest 2 View  Result Date: 08/01/2016 CLINICAL DATA:  Acute acute onset worsening chest pain for 2 days. History of CHF, hypertension, diabetes. EXAM: CHEST  2 VIEW COMPARISON:  Chest radiograph July 30, 2016 FINDINGS: The cardiac silhouette is mildly enlarged and unchanged. Coronary artery stent. Mildly calcified aortic knob. No pleural effusion or focal consolidation. No pneumothorax. Soft tissue planes included osseous structures are nonsuspicious unchanged, moderate degenerative change of thoracic spine. IMPRESSION: Mild cardiomegaly, no acute pulmonary process. Electronically Signed   By: Elon Alas M.D.   On: 08/01/2016 23:12   Nm Pulmonary Perf And Vent  Result Date: 08/02/2016 CLINICAL DATA:  Chest pain and shortness of Breath EXAM: NUCLEAR MEDICINE VENTILATION - PERFUSION LUNG SCAN TECHNIQUE: Ventilation images were obtained in multiple projections using inhaled aerosol Tc-42m DTPA. Perfusion images were obtained in multiple projections after intravenous injection of Tc-65m MAA. RADIOPHARMACEUTICALS:  32 mCi Technetium-46m DTPA aerosol inhalation and 4.2 mCi Technetium-16m MAA IV COMPARISON:  None. FINDINGS: Ventilation: No focal ventilation defect. Perfusion: No wedge shaped peripheral perfusion defects to suggest acute pulmonary embolism. IMPRESSION: No evidence of ventilation perfusion mismatch to suggest pulmonary embolism. Electronically Signed   By: Inez Catalina M.D.   On: 08/02/2016 10:19    Cardiac Studies   Echo pending  Patient Profile     75 year old man with history of coronary artery disease status post prior PCI's to the LAD and RCA, with NSTEMI 07/30/16 (home one day),  AAA, diabetes, hypertension,  history of sleep apnea, chronic kidney disease, who presented to the ER after developing acute onset of chest pain initially while walking but persistent at rest while at [employer]  Was found to have an elevated D-dimer level. Has had an echocardiogram as well as a V/Q scan.   Assessment & Plan    Chest pain associated with SOB since placement of stent that had increased with elevated troponin though could be coming down from NSTEMI on the 19th of this month.   Elevated D Dimer > 20 with neg VQ for PE.   SOB no edema or fluid on CXR ?  Due to Brilinta  Does cough causing increased chest pain ? Viral syndrome as well with mild fever at 99  CAD s/p multiple PCI and recent PCI to RCA and LCx and OM-- Mildly elevated troponin - Continue ASA, Brillinta as above. ? Change Brillinta to plavix or effient?  - Continue Atorva, fibrate, imdur - Daily ECG - Repeat troponin to ensure down trending with CK/MB - TTE as above - Tachy on exam, but warm.  Will not increase BB until TTE done for EF.    CKD - Elevated from recent cath, but within previous range.  CTM  DM - SSI ordered; pharmacy consult to help with mealtime insulin dosing   OSA - home CPAP     Signed, Cecilie Kicks, NP  08/03/2016, 8:40 AM   The patient has been seen in conjunction with Cecilie Kicks, NP. All aspects of care have been considered and discussed. The patient has been personally interviewed, examined, and all clinical data has been reviewed.   Images from the most recent multivessel PCI of circumflex and RCA were reviewed. Nice angiographic results were obtained. Current presentation does not include symptoms similar to presentation.  Complaint is more of upper respiratory congestion, feeling of dyspnea, and frequent coughing since discharge from the hospital.  Cardiac markers are trending downward from low elevation on admission. This likely reflects persistent elevation from non-ST elevation MI 1 week  ago.  Plan to switch Brilinta to Effient. Discontinue IV heparin. Ambulate. Antihistamine therapy for congestion.

## 2016-08-03 NOTE — Progress Notes (Signed)
Preliminary results by tech: Bilateral lower extremity duplex complete. Bilateral lower extremities negative for deep and superficial vein thrombosis. Negative for bakers cysts bilaterally. Lita Mains- RDMS, RVT 1:03 PM  08/03/2016

## 2016-08-03 NOTE — Progress Notes (Signed)
ANTICOAGULATION CONSULT NOTE - Follow Up Consult  Pharmacy Consult for Heparin Indication: chest pain/ACS  Allergies  Allergen Reactions  . Levemir [Insulin Detemir] Swelling    Patient had redness and swelling and tenderness at injection site.  . Codeine Other (See Comments)    Makes him "shakey", is OK with hydrocodone GI UPSET & TREMORS  . Lipitor [Atorvastatin] Other (See Comments)    Muscle aches; liver functions. Patient is currently taken  . Novolog Mix [Insulin Aspart Prot & Aspart]     Causes skin to swell/itch at injection site    Patient Measurements: Height: 5\' 10"  (177.8 cm) Weight: 255 lb 6.4 oz (115.8 kg) IBW/kg (Calculated) : 73 Heparin Dosing Weight: 100 kg  Vital Signs: Temp: 99 F (37.2 C) (10/23 0555) Temp Source: Oral (10/23 0555) BP: 119/68 (10/23 0555) Pulse Rate: 94 (10/23 0555)  Labs:  Recent Labs  08/01/16 2210 08/02/16 0239 08/02/16 0951 08/02/16 1535 08/02/16 2046 08/03/16 0409  HGB 13.9 13.9  --   --   --  13.6  HCT 40.6 40.9  --   --   --  40.4  PLT 177 163  --   --   --  180  HEPARINUNFRC  --   --  0.25*  --  0.34 0.35  CREATININE 1.78* 1.55*  --   --   --   --   CKTOTAL  --  145  --   --   --   --   CKMB  --  2.2  --   --   --   --   TROPONINI 0.10* 0.09* 0.07* 0.06*  --   --     Estimated Creatinine Clearance: 51.7 mL/min (by C-G formula based on SCr of 1.55 mg/dL (H)).   Medical History: Past Medical History:  Diagnosis Date  . AAA (abdominal aortic aneurysm) ([place]) 12/15/2014   a. Mild aneurysmal dilatation of the infrarenal abdominal aorta 3.2 cm - f/u due by 2019.  . Arthritis    "all over" (07/30/2016)  . CAD (coronary artery disease)    a. stent to LAD 2000. b. possible spasm by cath 2001. c. IVUS/PTCA/DES to mLAD 05/2013. d. PTCA/DES of dRCA into ostial rPDA 07/2013. e. PTCA of OM2, DES to Cx, DES to [facility] 07/2016.  Marland Kitchen Chronic bronchitis ([place])   . Chronic diastolic CHF (congestive heart failure) ([place])   . Chronic  lower back pain   . Chronic neck pain   . CKD (chronic kidney disease), stage III   . Diabetic peripheral neuropathy ([place])   . Ejection fraction    55%, 07/2010, mild inferior hypo  . GERD (gastroesophageal reflux disease)   . Gout   . H/O diverticulitis of colon 01/31/2015  . H/O hiatal hernia   . History of blood transfusion ~ 10/1940   "had pneumonia"  . HTN (hypertension)   . Hyperlipidemia   . Melanoma of forearm, left ([place]) 02/2011   with wide excision   . Nephrolithiasis   . Obesity   . OSA on CPAP     [name] since 2000  . Positive D-dimer    a. significant elevation ,hospital 07/2010, etiology unclear. b. D Dimer chronically > 20.  . Skin cancer   . Spinal stenosis    a. s/p surgical repair 2013  . Type II diabetes mellitus ([place])   . Ventral hernia       Assessment: 75 y.o. M presents to [facility] with CP. Troponin up to 0.1. To begin  heparin for ACS.   Heparin drip rate 1600 uts/hr  heparin level therapeutic at 0.35.  VQ scan negative.  CBC stable, no bleeding noted  Goal of Therapy:  Heparin level 0.3-0.7 units/ml Monitor platelets by anticoagulation protocol: Yes   Plan:  Continue IV heparin at current rate. Daily heparin level and CBC. D/c heparin soon?  Uvaldo Rising, BCPS  Clinical Pharmacist Pager [phone]  08/03/2016 7:48 AM

## 2016-08-04 ENCOUNTER — Telehealth: Payer: Self-pay | Admitting: Cardiology

## 2016-08-04 DIAGNOSIS — R0609 Other forms of dyspnea: Secondary | ICD-10-CM

## 2016-08-04 DIAGNOSIS — Z23 Encounter for immunization: Secondary | ICD-10-CM | POA: Diagnosis not present

## 2016-08-04 LAB — CBC
HCT: 39.4 % (ref 39.0–52.0)
Hemoglobin: 13.4 g/dL (ref 13.0–17.0)
MCH: 31.1 pg (ref 26.0–34.0)
MCHC: 34 g/dL (ref 30.0–36.0)
MCV: 91.4 fL (ref 78.0–100.0)
PLATELETS: 177 10*3/uL (ref 150–400)
RBC: 4.31 MIL/uL (ref 4.22–5.81)
RDW: 13.7 % (ref 11.5–15.5)
WBC: 9.5 10*3/uL (ref 4.0–10.5)

## 2016-08-04 LAB — GLUCOSE, CAPILLARY: Glucose-Capillary: 133 mg/dL — ABNORMAL HIGH (ref 65–99)

## 2016-08-04 MED ORDER — LORATADINE 10 MG PO TABS: 10.0000 mg | ORAL_TABLET | Freq: Every day | ORAL | 0 refills | Status: DC

## 2016-08-04 MED ORDER — PRASUGREL HCL 10 MG PO TABS: 10.0000 mg | ORAL_TABLET | Freq: Every day | ORAL | 11 refills | Status: DC

## 2016-08-04 NOTE — Discharge Summary (Addendum)
Discharge Summary    Patient ID: William Hendrix,  MRN: [id number], DOB/AGE: [birth year] 75 y.o.  Admit date: 08/01/2016 Discharge date: 08/04/2016  Primary Care Provider: Penni Hendrix Primary Cardiologist: Dr. Stanford Breed  Discharge Diagnoses    Principal Problem:   Chest pain Active Problems:   CAD (coronary artery disease)   Essential hypertension   SOB (shortness of breath)   Hypertriglyceridemia   Uncontrolled type 2 diabetes mellitus with circulatory disorder, with long-term current use of insulin (HCC)   CKD (chronic kidney disease), stage III   Dyspnea   Pressure injury of skin   Allergies Allergies  Allergen Reactions  . Levemir [Insulin Detemir] Swelling    Patient had redness and swelling and tenderness at injection site.  . Codeine Other (See Comments)    Makes him "shakey", is OK with hydrocodone GI UPSET & TREMORS  . Lipitor [Atorvastatin] Other (See Comments)    Muscle aches; liver functions. Patient is currently taken  . Novolog Mix [Insulin Aspart Prot & Aspart]     Causes skin to swell/itch at injection site    Diagnostic Studies/Procedures     Bilateral Lower Extremity Venous Duplex Evaluation 08/03/16  Summary:  - No evidence of deep vein or superficial thrombosis involving the   right lower extremity and left lower extremity. - No evidence of Baker&'s cyst on the right or left.  Other specific details can be found in the table(s) above. Prepared and Electronically Authenticated by   Transthoracic Echocardiography 08/03/16 LV EF: 45% -   50%  ------------------------------------------------------------------- Indications:      CHF - 428.0.  ------------------------------------------------------------------- History:   Risk factors:  Hypertension. Diabetes mellitus.  ------------------------------------------------------------------- Study Conclusions  - Left ventricle: The cavity size was normal. Wall thickness was  increased in a pattern of mild LVH. Systolic function was mildly   reduced. The estimated ejection fraction was in the range of 45%   to 50%. Doppler parameters are consistent with abnormal left   ventricular relaxation (grade 1 diastolic dysfunction). - Right ventricle: The cavity size was mildly dilated. Systolic   function was mildly reduced. - Pericardium, extracardiac: A trivial pericardial effusion was   identified. - Impressions: Poor acoustic windows limit study, even with the use   of Definity.  Impressions:  - Poor acoustic windows limit study, even with the use of Definity.    History of Present Illness     75 year old man with history of coronary artery disease status post prior PCI's to the LAD and RCA (stent to LAD 2000, possible spasm by cath 2001, IVUS/PTCA/DES to [place] 05/2013, PTCA/DES of dRCA into ostial rPDA 07/2013), AAA, diabetes, hypertension, history of sleep apnea, chronic kidney disease, who recently underwent 07/30/16 2V PCI (LCx and mid RCA) discharged 07/31/16  (at the time of the cath upper normal to mildly elevated LV filling pressures, LV gram not performed due to CKD.  Last EF by TTE 2014 and nl 55-60%) who presented with CP 08/01/16.   Over the course of the past half day, he has developed dyspnea on exertion that has been progressive and also orthopnea.  No edema.  Has bad OSA so has some of this chronically.  However, his dyspnea has progressed to inability to finish full sentences without getting winded.  Prior to discharge, he did not notice these symptoms (developed post-D/C).  Denies fevers, chills, COPD flare.  He notes that he is making normal amount of urine.  He has also noticed a cough with clear  sputum production.  He denies missing any medications including Brillinta/ASA.  No bleeding/bruiding other than right radial access site (had oozing when changing bandages, now resolved).  No real chest pain.  Presented to an outside ED and given recent care  here, transferred to [facility].     Hospital Course     Consultants: None  The patient was started on IV heparin. He was found to have an elevated d-dimer > 20. V/Q scan ordered given CKD and recent contrast exposure which was negative for PE. Cardiac markers were trending down from prior admission for NSTEMI a week ago. Patient's complaint is more of upper respiratory congestion, feeling of dyspnea, and frequent coughing since discharge from the hospital. D/C heparin. Switched Brillinta to Effient with improvement in SOB.  Antihistamine therapy for congestion.  Echo during admission showed LV EF of 45-50%, mild LVH, grade 1 DD, RV systolic function was minimally reduced. LE doppler was negative for DVT.  Ambulated well.   On exam the patient has some nasal drainage. Chest revealed no wheezes or rales. Faint rhonchi were heard they cleared with cough. No pericardial rub or murmur was noted. Neck veins were not distended.  The patient was been seen by Dr. Tamala Julian today and deemed ready for discharge home. All follow-up appointments have been scheduled. Discharge medications are listed below.    Discharge Vitals Blood pressure 122/66, pulse 81, temperature 97.8 F (36.6 C), temperature source Oral, resp. rate 18, height 5\' 10"  (1.778 m), weight 254 lb 3.2 oz (115.3 kg), SpO2 98 %.  Filed Weights   08/02/16 0332 08/03/16 0555 08/04/16 0500  Weight: 268 lb 14.4 oz (122 kg) 255 lb 6.4 oz (115.8 kg) 254 lb 3.2 oz (115.3 kg)    Labs & Radiologic Studies     CBC  Recent Labs  08/01/16 2210  08/03/16 0409 08/04/16 0332  WBC 10.1  < > 10.0 9.5  NEUTROABS 6.0  --   --   --   HGB 13.9  < > 13.6 13.4  HCT 40.6  < > 40.4 39.4  MCV 93.3  < > 91.8 91.4  PLT 177  < > 180 177  < > = values in this interval not displayed. Basic Metabolic Panel  Recent Labs  08/01/16 2210 08/02/16 0239  NA 139 139  K 3.8 3.7  CL 106 106  CO2 23 23  GLUCOSE 138* 149*  BUN 28* 26*  CREATININE 1.78* 1.55*   CALCIUM 9.9 9.9  MG  --  1.8   Liver Function Tests No results for input(s): AST, ALT, ALKPHOS, BILITOT, PROT, ALBUMIN in the last 72 hours. No results for input(s): LIPASE, AMYLASE in the last 72 hours. Cardiac Enzymes  Recent Labs  08/02/16 0239 08/02/16 0951 08/02/16 1535  CKTOTAL 145  --   --   CKMB 2.2  --   --   TROPONINI 0.09* 0.07* 0.06*   BNP Invalid input(s): POCBNP D-Dimer  Recent Labs  08/02/16 0239  DDIMER >20.00*   Hemoglobin A1C No results for input(s): HGBA1C in the last 72 hours. Fasting Lipid Panel No results for input(s): CHOL, HDL, LDLCALC, TRIG, CHOLHDL, LDLDIRECT in the last 72 hours. Thyroid Function Tests No results for input(s): TSH, T4TOTAL, T3FREE, THYROIDAB in the last 72 hours.  Invalid input(s): FREET3  Dg Chest 2 View  Result Date: 08/01/2016 CLINICAL DATA:  Acute acute onset worsening chest pain for 2 days. History of CHF, hypertension, diabetes. EXAM: CHEST  2 VIEW COMPARISON:  Chest  radiograph July 30, 2016 FINDINGS: The cardiac silhouette is mildly enlarged and unchanged. Coronary artery stent. Mildly calcified aortic knob. No pleural effusion or focal consolidation. No pneumothorax. Soft tissue planes included osseous structures are nonsuspicious unchanged, moderate degenerative change of thoracic spine. IMPRESSION: Mild cardiomegaly, no acute pulmonary process. Electronically Signed   By: Elon Alas M.D.   On: 08/01/2016 23:12   Dg Chest 2 View  Result Date: 07/30/2016 CLINICAL DATA:  Chest  Pain since  This  am EXAM: CHEST  2 VIEW COMPARISON:  05/18/2013 FINDINGS: Heart size is upper limits normal. Coronary stent is visible. The lungs are free of focal consolidations and pleural effusions. No pulmonary edema. Mid thoracic spondylosis noted. IMPRESSION: No active cardiopulmonary disease. Electronically Signed   By: Nolon Nations M.D.   On: 07/30/2016 11:27   Nm Pulmonary Perf And Vent  Result Date: 08/02/2016 CLINICAL  DATA:  Chest pain and shortness of Breath EXAM: NUCLEAR MEDICINE VENTILATION - PERFUSION LUNG SCAN TECHNIQUE: Ventilation images were obtained in multiple projections using inhaled aerosol Tc-37m DTPA. Perfusion images were obtained in multiple projections after intravenous injection of Tc-71m MAA. RADIOPHARMACEUTICALS:  32 mCi Technetium-60m DTPA aerosol inhalation and 4.2 mCi Technetium-5m MAA IV COMPARISON:  None. FINDINGS: Ventilation: No focal ventilation defect. Perfusion: No wedge shaped peripheral perfusion defects to suggest acute pulmonary embolism. IMPRESSION: No evidence of ventilation perfusion mismatch to suggest pulmonary embolism. Electronically Signed   By: Inez Catalina M.D.   On: 08/02/2016 10:19    Disposition   Pt is being discharged home today in good condition.  Follow-up Plans & Appointments    Follow-up Information    William Homans, MD Follow up in 3 day(s).   Specialty:  Family Medicine Why:  for post hospital and possible respiratory congestion Contact information: [address] [place] [address] [phone]        Hao Meng, [place]. Go on 08/12/2016.   Specialties:  Cardiology, Radiology Why:  @10 :00 am for TCM Contact information: [address] [place] [address] [phone]          Discharge Instructions    Diet - low sodium heart healthy    Complete by:  As directed    Increase activity slowly    Complete by:  As directed       Discharge Medications   Current Discharge Medication List    START taking these medications   Details  loratadine (CLARITIN) 10 MG tablet Take 1 tablet (10 mg total) by mouth daily. Qty: 30 tablet, Refills: 0    prasugrel (EFFIENT) 10 MG TABS tablet Take 1 tablet (10 mg total) by mouth daily. Qty: 30 tablet, Refills: 11      CONTINUE these medications which have NOT CHANGED   Details  acetaminophen (TYLENOL) 500 MG tablet Take 1,000 mg by mouth 2 (two) times daily.      albuterol (PROVENTIL HFA;VENTOLIN HFA) 108 (90 BASE) MCG/ACT inhaler Inhale 2 puffs into the lungs every 6 (six) hours as needed for wheezing or shortness of breath. Qty: 18 Inhaler, Refills: 2   Associated Diagnoses: Coronary artery disease involving native coronary artery of native heart without angina pectoris    allopurinol (ZYLOPRIM) 300 MG tablet TAKE 1 TABLET EVERY DAY Qty: 90 tablet, Refills: 1    aspirin EC 81 MG tablet Take 81 mg by mouth daily.    atorvastatin (LIPITOR) 40 MG tablet Take 1 tablet (40 mg total) by mouth daily. Qty: 90 tablet,  Refills: 0    B Complex Vitamins (B COMPLEX-B12 PO) Take 1 tablet by mouth at bedtime.    Cholecalciferol (VITAMIN D) 1000 UNITS capsule Take 1,000 Units by mouth at bedtime.     cyclobenzaprine (FLEXERIL) 5 MG tablet Take 1 tablet (5 mg total) by mouth 3 (three) times daily as needed for muscle spasms. Qty: 30 tablet, Refills: 1    fenofibrate 160 MG tablet TAKE 1 TABLET EVERY DAY Qty: 90 tablet, Refills: 1    fluocinonide cream (LIDEX) [id number] % Apply 1 application topically daily as needed. FOR RASH    fluticasone (FLONASE) 50 MCG/ACT nasal spray Place 2 sprays into both nostrils daily. Qty: 16 g, Refills: 1    furosemide (LASIX) 80 MG tablet Take 0.5 tablets (40 mg total) by mouth daily. Qty: 45 tablet, Refills: 1    insulin NPH Human (NOVOLIN N) 100 UNIT/ML injection Inject 40 units in am and 35 units in pm Qty: 70 mL, Refills: 1   Associated Diagnoses: Uncontrolled type 2 diabetes mellitus with other circulatory complication, with long-term current use of insulin (HCC)    insulin regular (NOVOLIN R RELION) 100 units/mL injection Inject 0.25-0.3 mLs (25-30 Units total) into the skin 3 (three) times daily before meals. Qty: 70 mL, Refills: 1    isosorbide mononitrate (IMDUR) 30 MG 24 hr tablet TAKE 1 TABLET EVERY DAY Qty: 90 tablet, Refills: 3    metoprolol (LOPRESSOR) 50 MG tablet TAKE 1 TABLET TWICE DAILY Qty: 180 tablet,  Refills: 1    nitroGLYCERIN (NITROSTAT) 0.4 MG SL tablet Place 1 tablet (0.4 mg total) under the tongue every 5 (five) minutes as needed for chest pain. Qty: 25 tablet, Refills: 3    pantoprazole (PROTONIX) 20 MG tablet Take 1 tablet (20 mg total) by mouth daily. Qty: 90 tablet, Refills: 3    tamsulosin (FLOMAX) 0.4 MG CAPS capsule TAKE 1 CAPSULE EVERY DAY Qty: 90 capsule, Refills: 1      STOP taking these medications     ticagrelor (BRILINTA) 90 MG TABS tablet          Outstanding Labs/Studies   N/A  Duration of Discharge Encounter   Greater than 30 minutes including physician time.  Signed, Bhagat,Bhavinkumar PA-C 08/04/2016, 9:02 AM  The patient has been seen in conjunction with Robbie Lis, MD. All aspects of care have been considered and discussed. The patient has been personally interviewed, examined, and all clinical data has been reviewed.   Vague and multisystem complaints on admission. Sensation of dyspnea has improved off Brilinta. Upper respiratory congestion and cough is improved on antihistamine therapy.  No evidence of infarction on this admission. LV function is low normal. No evidence of PE noted.  Patient is ready for discharge. Continue Effient for at least 6-8 weeks before switching to Plavix.  Follow-up with Dr. Stanford Breed. Discharge today.

## 2016-08-04 NOTE — Progress Notes (Signed)
Discharged pt home with wife.  Reviewed discharge instructions and education, all questions answered.  Assessment unchanged from earlier.

## 2016-08-04 NOTE — Telephone Encounter (Signed)
currently admitted today

## 2016-08-04 NOTE — Telephone Encounter (Signed)
Patient contacted regarding discharge from [facility] on 08/04/16.  Patient understands to follow up with provider MENG on 08/12/16 at 10 AM at [facility]. Patient understands discharge instructions? yes  Patient understands medications and regiment? yes  Patient understands to bring all medications to this visit? yes

## 2016-08-04 NOTE — Telephone Encounter (Signed)
New message     TOC appt on  11.1.2017 with [facility] per [employer].

## 2016-08-04 NOTE — Plan of Care (Signed)
Problem: Education: Goal: Knowledge of [facility] Education information/materials will improve Outcome: Completed/Met Date Met: 08/04/16 Pt educated throughout entire admission regarding tests, procedures, medications, and available resources.   Problem: Safety: Goal: Ability to remain free from injury will improve Outcome: Completed/Met Date Met: 08/04/16 Pt has remained free from injury during this admission   Problem: Physical Regulation: Goal: Will remain free from infection Outcome: Completed/Met Date Met: 08/04/16 Pt has remained free from infection during this admission   Problem: Skin Integrity: Goal: Risk for impaired skin integrity will decrease Outcome: Completed/Met Date Met: 08/04/16 Pt educated on the importance of turning every 2 hours to relieve pressure on his buttocks   Problem: Fluid Volume: Goal: Ability to maintain a balanced intake and output will improve Outcome: Completed/Met Date Met: 08/04/16 Pt has adequate intake and output   Problem: Nutrition: Goal: Adequate nutrition will be maintained Outcome: Completed/Met Date Met: 08/04/16 Pt has adequate intake and output

## 2016-08-04 NOTE — Care Management (Signed)
[id number] 08-04-16 CM did call [facility] in [place] and Effient is available. Medication is generic now- no 30 day free card to be provided. Cost will be $47.00 and pt is aware. No further needs from CM @ this time. Bethena Roys, RN BSN [phone]

## 2016-08-04 NOTE — Consult Note (Signed)
   [facility] Inpatient Consult   08/04/2016  William Hendrix July 26, 1941 [id number]   Patient is eligible for [facility] Care Management services with his [place].  Chart review reveals this is the patient's second admission in 6 months. MD notes reveals the patient is a 75 year old man with history of coronary artery disease status post prior PCI's to the LAD and RCA (stent to LAD 2000, possible spasm by cath 2001, IVUS/PTCA/DES to [place] 05/2013, PTCA/DES of dRCA into ostial rPDA 07/2013), AAA, diabetes, hypertension, history of sleep apnea, chronic kidney disease, who recently underwent 07/30/16 2V PCI (LCx and mid RCA) discharged 07/31/16 (at the time of the cath upper normal to mildly elevated LV filling pressures, LV gram not performed due to CKD. Last EF by TTE 2014 and nl 55-60%) who presented with CP 08/01/16.     Met with the patient regarding the benefits of [facility] services.Explained that [facility] is a covered benefit of insurance. Patient endorses [name] as his primary care provider.  He denies any issues with his medications [mail order and [employer]], transportation or need for follow up.  "I am not well but I do feel a lot better and I am going home today."   Review information for [facility] Care Management and a brochure was provided with contact information and received.[name] that [facility] does not interfere with or replace any services arranged by the inpatient care management staff.  Patient declined services with [facility] stating, "I feel like I will be alright, I have had stents before and all."  No further needs identified.  For questions, please contact:  Natividad Brood, RN BSN [facility] Liaison  [phone] business mobile phone Toll free office [phone]

## 2016-08-05 ENCOUNTER — Ambulatory Visit (HOSPITAL_BASED_OUTPATIENT_CLINIC_OR_DEPARTMENT_OTHER)
Admission: RE | Admit: 2016-08-05 | Discharge: 2016-08-05 | Disposition: A | Discharge status: Home / Self Care | Payer: Commercial Managed Care - HMO | Source: Ambulatory Visit | Attending: Medical | Admitting: Medical

## 2016-08-05 ENCOUNTER — Telehealth: Payer: Self-pay | Admitting: *Deleted

## 2016-08-05 ENCOUNTER — Ambulatory Visit (INDEPENDENT_AMBULATORY_CARE_PROVIDER_SITE_OTHER): Payer: Commercial Managed Care - HMO | Admitting: Medical

## 2016-08-05 ENCOUNTER — Encounter: Payer: Self-pay | Admitting: Medical

## 2016-08-05 VITALS — HR 99 | Temp 98.3°F | Ht 69.0 in | Wt 259.0 lb

## 2016-08-05 DIAGNOSIS — J209 Acute bronchitis, unspecified: Secondary | ICD-10-CM

## 2016-08-05 DIAGNOSIS — R5383 Other fatigue: Secondary | ICD-10-CM | POA: Diagnosis not present

## 2016-08-05 DIAGNOSIS — R7989 Other specified abnormal findings of blood chemistry: Secondary | ICD-10-CM

## 2016-08-05 DIAGNOSIS — J0100 Acute maxillary sinusitis, unspecified: Secondary | ICD-10-CM

## 2016-08-05 DIAGNOSIS — R778 Other specified abnormalities of plasma proteins: Secondary | ICD-10-CM

## 2016-08-05 DIAGNOSIS — R0602 Shortness of breath: Secondary | ICD-10-CM | POA: Diagnosis not present

## 2016-08-05 DIAGNOSIS — R05 Cough: Secondary | ICD-10-CM

## 2016-08-05 DIAGNOSIS — Z955 Presence of coronary angioplasty implant and graft: Secondary | ICD-10-CM

## 2016-08-05 DIAGNOSIS — R059 Cough, unspecified: Secondary | ICD-10-CM

## 2016-08-05 DIAGNOSIS — R0781 Pleurodynia: Secondary | ICD-10-CM

## 2016-08-05 DIAGNOSIS — R748 Abnormal levels of other serum enzymes: Secondary | ICD-10-CM

## 2016-08-05 LAB — CBC WITH DIFFERENTIAL/PLATELET
BASOS ABS: 0 10*3/uL (ref 0.0–0.1)
Basophils Relative: 0.2 % (ref 0.0–3.0)
EOS ABS: 0.2 10*3/uL (ref 0.0–0.7)
Eosinophils Relative: 2.4 % (ref 0.0–5.0)
HEMATOCRIT: 41.6 % (ref 39.0–52.0)
Hemoglobin: 14.4 g/dL (ref 13.0–17.0)
LYMPHS ABS: 2.2 10*3/uL (ref 0.7–4.0)
LYMPHS PCT: 27 % (ref 12.0–46.0)
MCHC: 34.5 g/dL (ref 30.0–36.0)
MCV: 91.5 fl (ref 78.0–100.0)
Monocytes Absolute: 0.7 10*3/uL (ref 0.1–1.0)
Monocytes Relative: 8.4 % (ref 3.0–12.0)
NEUTROS ABS: 5.1 10*3/uL (ref 1.4–7.7)
NEUTROS PCT: 62 % (ref 43.0–77.0)
PLATELETS: 244 10*3/uL (ref 150.0–400.0)
RBC: 4.55 Mil/uL (ref 4.22–5.81)
RDW: 14 % (ref 11.5–15.5)
WBC: 8.2 10*3/uL (ref 4.0–10.5)

## 2016-08-05 LAB — COMPLETE METABOLIC PANEL WITH GFR
ALT: 16 U/L (ref 9–46)
AST: 16 U/L (ref 10–35)
Albumin: 3.9 g/dL (ref 3.6–5.1)
Alkaline Phosphatase: 41 U/L (ref 40–115)
BUN: 28 mg/dL — AB (ref 7–25)
CHLORIDE: 101 mmol/L (ref 98–110)
CO2: 26 mmol/L (ref 20–31)
CREATININE: 1.47 mg/dL — AB (ref 0.70–1.18)
Calcium: 9.9 mg/dL (ref 8.6–10.3)
GFR, Est African American: 53 mL/min — ABNORMAL LOW (ref >=60)
GFR, Est Non African American: 46 mL/min — ABNORMAL LOW (ref >=60)
GLUCOSE: 132 mg/dL — AB (ref 65–99)
Potassium: 4 mmol/L (ref 3.5–5.3)
SODIUM: 138 mmol/L (ref 135–146)
Total Bilirubin: 0.6 mg/dL (ref 0.2–1.2)
Total Protein: 7 g/dL (ref 6.1–8.1)

## 2016-08-05 LAB — TROPONIN I: TNIDX: 0.02 ug/l (ref 0.00–0.06)

## 2016-08-05 MED ORDER — DOXYCYCLINE HYCLATE 100 MG PO TABS: 100.0000 mg | ORAL_TABLET | Freq: Two times a day (BID) | ORAL | 0 refills | Status: DC

## 2016-08-05 MED ORDER — CEFTRIAXONE SODIUM 1 G IJ SOLR
1.0000 g | Freq: Once | INTRAMUSCULAR | Status: AC
Start: 2016-08-05 — End: 2016-08-05
  Administered 2016-08-05: 1 g via INTRAMUSCULAR

## 2016-08-05 MED ORDER — HYDROCODONE-HOMATROPINE 5-1.5 MG/5ML PO SYRP: 5.0000 mL | ORAL_SOLUTION | Freq: Three times a day (TID) | ORAL | 0 refills | Status: DC | PRN

## 2016-08-05 MED ORDER — FLUTICASONE PROPIONATE 50 MCG/ACT NA SUSP: 2.0000 | Freq: Every day | NASAL | 1 refills | Status: DC

## 2016-08-05 NOTE — Patient Instructions (Signed)
You appear to have bronchitis and sinusitis. Rest hydrate and tylenol for fever. I am prescribing cough medicine hycodan, and doxycycline  antibiotic. For your nasal congestion rx flonase nasal steroid.   Possible pneumonia. Want to get cxr today to evaluate.  Reviewed you recent hospitalizations. Will recheck cbc, cmp, troponin today. Depending on labs might advise ED evaluation again. Also if symptoms worsen again as before then ED evaluation.  Follow up in 5 days or as needed

## 2016-08-05 NOTE — Progress Notes (Signed)
Subjective:    Patient ID: William Hendrix, male    DOB: 1941-01-13, 75 y.o.   MRN: [id number]  HPI  Pt had 2 stents on Thursday and on Friday he was sent home. Pt had severe symptoms of chest pain on Thursday that led to stents being placed.   Below summary of hospital course first admission 89M with CAD (stent to LAD 2000, possible spasm by cath 2001, IVUS/PTCA/DES to [place] 05/2013, PTCA/DES of dRCA into ostial rPDA 07/2013), arthritis, CKD stage III, DM, GERD, HTN, hyperlipidemia, obesity, sleep apnea, mild aneurysmal dilation of infrarenal abdominal aorta (3.2cm in 12/2014 with recommended f/u 2019) admitted with chest pain and mild NSTEMI with peak troponin of 0.17. He was admitted and underwent LHC 07/31/16 with PTCA to OM2, DES to mLCx, and DES to [facility], upper normal to mildly elevated LV filling pressures, LV gram not performed due to CKD. He tolerated this procedure well. He had myalgias with higher dose statin so will continue current dose of 40mg . He declined participation in [place] so received 30 day free rx of Brilinta then standard refills. 2D echo will be considered as outpatient. F/u Cr this AM was 1.33 (baseline appears 1.3-1.6). We will resume Lasix tomorrow - note he said he's actually taking 1/2 tablet a day. Dr. Irish Lack has seen and examined the patient today and feels he is stable for discharge.  I have sent a message to our [facility] office's scheduler requesting a follow-up appointment, and our office will call the patient with this information - the patient requested f/u in the [place] location. I am not sure this will be available so early as the required 1 week TOC, and the patient is aware.  End of summary on 1st admission.   He states he did not feel well when he was being discharge post stent placement. Described mostly feeling fatigued.  He then went back to ED and for  sob, nasal congestion and coughing. But described severe difficulty breathing and chest wall  pain. He was readmitted per his report on Saturday.  He was admitted again second time and evaluated. Below is summary.  The patient was started on IV heparin. He was found to have an elevated d-dimer > 20. V/Q scan ordered given CKD and recent contrast exposure which was negative for PE. Cardiac markers were trending down from prior admission for NSTEMI a week ago. Patient's complaint is more of upper respiratory congestion, feeling of dyspnea, and frequent coughing since discharge from the hospital. D/C heparin. Switched Brillinta to Effient with improvement in SOB.  Antihistamine therapy for congestion.  Echo during admission showed LV EF of 45-50%, mild LVH, grade 1 DD, RV systolic function was minimally reduced. LE doppler was negative for DVT.  Ambulated well.   On exam the patient has some nasal drainage. Chest revealed no wheezes or rales. Faint rhonchi were heard they cleared with cough. No pericardial rub or murmur was noted. Neck veins were not distended.  The patient was been seen by Dr. Tamala Julian today and deemed ready for discharge home. All follow-up appointments have been scheduled. Discharge medications are listed below.   End of summary second admission.   Since second admission and discharge  he reports coughing, some chest congestion sinus inflammation. When he blows nose will get yellow/green mucous with some blood tinged appearance. On his second hospitalization. They gave him claritin. He feels some better. Overall a lot better than 2nd presentation to ED. Not reporting chest pain today.  Or sob.     Review of Systems  Constitutional: Positive for fatigue. Negative for chills and fever.  HENT: Positive for congestion and sinus pressure. Negative for rhinorrhea and sneezing.   Respiratory: Positive for cough. Negative for apnea, choking, shortness of breath and wheezing.   Cardiovascular: Negative for chest pain and palpitations.  Gastrointestinal: Negative for abdominal  pain.  Musculoskeletal: Negative for back pain.       Rib pain and upper back pain when coughing.  Skin: Negative for rash.  Neurological: Negative for dizziness and headaches.  Hematological: Negative for adenopathy. Does not bruise/bleed easily.  Psychiatric/Behavioral: Negative for behavioral problems, confusion and decreased concentration.    Past Medical History:  Diagnosis Date  . AAA (abdominal aortic aneurysm) ([place]) 12/15/2014   a. Mild aneurysmal dilatation of the infrarenal abdominal aorta 3.2 cm - f/u due by 2019.  . Arthritis    "all over" (07/30/2016)  . CAD (coronary artery disease)    a. stent to LAD 2000. b. possible spasm by cath 2001. c. IVUS/PTCA/DES to mLAD 05/2013. d. PTCA/DES of dRCA into ostial rPDA 07/2013. e. PTCA of OM2, DES to Cx, DES to [facility] 07/2016.  Marland Kitchen Chronic bronchitis ([place])   . Chronic diastolic CHF (congestive heart failure) ([place])   . Chronic lower back pain   . Chronic neck pain   . CKD (chronic kidney disease), stage III   . Diabetic peripheral neuropathy ([place])   . Ejection fraction    55%, 07/2010, mild inferior hypo  . GERD (gastroesophageal reflux disease)   . Gout   . H/O diverticulitis of colon 01/31/2015  . H/O hiatal hernia   . History of blood transfusion ~ 10/1940   "had pneumonia"  . HTN (hypertension)   . Hyperlipidemia   . Melanoma of forearm, left ([place]) 02/2011   with wide excision   . Nephrolithiasis   . Obesity   . OSA on CPAP     [name] since 2000  . Positive D-dimer    a. significant elevation ,hospital 07/2010, etiology unclear. b. D Dimer chronically > 20.  . Skin cancer   . Spinal stenosis    a. s/p surgical repair 2013  . Type II diabetes mellitus ([place])   . Ventral hernia      Social History   Social History  . Marital status: Married    Spouse name: N/A  . Number of children: N/A  . Years of education: N/A   Occupational History  . [employer]    Social History Main Topics  . Smoking status: Former Smoker      Packs/day: 3.00    Years: 30.00    Types: Cigarettes    Quit date: 09/17/1996  . Smokeless tobacco: Never Used  . Alcohol use No  . Drug use: No  . Sexual activity: Not Currently    Birth control/ protection: None   Other Topics Concern  . Not on file   Social History Narrative   Married and lives locally with his wife.  [employer].    Past Surgical History:  Procedure Laterality Date  . BACK SURGERY    . CARDIAC CATHETERIZATION N/A 07/30/2016   Procedure: Left Heart Cath and Coronary Angiography;  Surgeon: Nelva Bush, MD;  Location: [place] CV LAB;  Service: Cardiovascular;  Laterality: N/A;  . CARDIAC CATHETERIZATION N/A 07/30/2016   Procedure: Coronary Stent Intervention;  Surgeon: Nelva Bush, MD;  Location: [place] [facility];  Service: Cardiovascular;  Laterality: N/A;  Mid CFX  and MID RCA  . CARDIAC CATHETERIZATION N/A 07/30/2016   Procedure: Coronary Balloon Angioplasty;  Surgeon: Nelva Bush, MD;  Location: [place] [facility];  Service: Cardiovascular;  Laterality: N/A;  OM 1  . CARPAL TUNNEL RELEASE Left   . CATARACT EXTRACTION W/ INTRAOCULAR LENS  IMPLANT, BILATERAL Bilateral ~ 2014  . CERVICAL DISC SURGERY  1990s  . CORONARY ANGIOPLASTY WITH STENT PLACEMENT  05/2013  . CORONARY ANGIOPLASTY WITH STENT PLACEMENT  07/26/2013   DES to RCA extending to PDA    . CORONARY ANGIOPLASTY WITH STENT PLACEMENT  07/30/2016  . CORONARY ANGIOPLASTY WITH STENT PLACEMENT  2000   CAD  . DENTAL SURGERY  04/2016   "got infected; had to dig it out"  . KNEE ARTHROSCOPY Right 1990's   rt  . LEFT AND RIGHT HEART CATHETERIZATION WITH CORONARY ANGIOGRAM N/A 07/26/2013   Procedure: LEFT AND RIGHT HEART CATHETERIZATION WITH CORONARY ANGIOGRAM;  Surgeon: [name], MD;  Location: [place] CATH LAB;  Service: Cardiovascular;  Laterality: N/A;  . LEFT HEART CATHETERIZATION WITH CORONARY ANGIOGRAM N/A 05/19/2013   Procedure: LEFT HEART CATHETERIZATION WITH CORONARY ANGIOGRAM;   Surgeon: [name], MD;  Location: [facility] CATH LAB;  Service: Cardiovascular;  Laterality: N/A;  . LUMBAR LAMINECTOMY/DECOMPRESSION MICRODISCECTOMY  08/30/2012   Procedure: LUMBAR LAMINECTOMY/DECOMPRESSION MICRODISCECTOMY 2 LEVELS;  Surgeon: [name], MD;  Location: [place] NEURO ORS;  Service: Neurosurgery;  Laterality: Bilateral;  Bilateral Lumbar three-four Lumbar four-five Laminotomies  . LUMBAR SPINE SURGERY  04/07/2016   Dr. Ellene Route; "?ruptured disc"  . MELANOMA EXCISION Left 02/2011   forearm  . NASAL SINUS SURGERY  1970s   "cut windows in sinus pockets"  . PERCUTANEOUS CORONARY STENT INTERVENTION (PCI-S)  05/19/2013   Procedure: PERCUTANEOUS CORONARY STENT INTERVENTION (PCI-S);  Surgeon: [name], MD;  Location: [facility] C[facility] LAB;  Service: Cardiovascular;;  . ULNAR TUNNEL RELEASE Left 04/07/2016   Dr. Ellene Route    Family History  Problem Relation Age of Onset  . Cancer Mother     intestinal   . Heart disease Mother   . Cancer Father     prostate  . Migraines Daughter   . Leukemia Sister   . Heart disease Sister   . Prostate cancer    . Kidney cancer    . Cancer      Bladder cancer  . Coronary artery disease    . Hypertension Sister   . Hypertension Brother   . Heart attack Neg Hx   . Stroke Neg Hx     Allergies  Allergen Reactions  . Levemir [Insulin Detemir] Swelling    Patient had redness and swelling and tenderness at injection site.  . Codeine Other (See Comments)    Makes him "shakey", is OK with hydrocodone GI UPSET & TREMORS  . Lipitor [Atorvastatin] Other (See Comments)    Muscle aches; liver functions. Patient is currently taken  . Novolog Mix [Insulin Aspart Prot & Aspart]     Causes skin to swell/itch at injection site    Current Outpatient Prescriptions on File Prior to Visit  Medication Sig Dispense Refill  . acetaminophen (TYLENOL) 500 MG tablet Take 1,000 mg by mouth 2 (two) times daily.    Marland Kitchen albuterol (PROVENTIL HFA;VENTOLIN HFA) 108 (90 BASE)  MCG/ACT inhaler Inhale 2 puffs into the lungs every 6 (six) hours as needed for wheezing or shortness of breath. 18 Inhaler 2  . allopurinol (ZYLOPRIM) 300 MG tablet TAKE 1 TABLET EVERY DAY 90 tablet 1  .  aspirin EC 81 MG tablet Take 81 mg by mouth daily.    Marland Kitchen atorvastatin (LIPITOR) 40 MG tablet Take 1 tablet (40 mg total) by mouth daily. 90 tablet 0  . B Complex Vitamins (B COMPLEX-B12 PO) Take 1 tablet by mouth at bedtime.    . Cholecalciferol (VITAMIN D) 1000 UNITS capsule Take 1,000 Units by mouth at bedtime.     . cyclobenzaprine (FLEXERIL) 5 MG tablet Take 1 tablet (5 mg total) by mouth 3 (three) times daily as needed for muscle spasms. (Patient taking differently: Take 5 mg by mouth 2 (two) times daily as needed for muscle spasms. ) 30 tablet 1  . fenofibrate 160 MG tablet TAKE 1 TABLET EVERY DAY (Patient taking differently: TAKE 1 TABLET EVERY night) 90 tablet 1  . fluocinonide cream (LIDEX) [id number] % Apply 1 application topically daily as needed. FOR RASH    . fluticasone (FLONASE) 50 MCG/ACT nasal spray Place 2 sprays into both nostrils daily. (Patient taking differently: Place 2 sprays into both nostrils as needed. ) 16 g 1  . furosemide (LASIX) 80 MG tablet Take 0.5 tablets (40 mg total) by mouth daily. 45 tablet 1  . insulin NPH Human (NOVOLIN N) 100 UNIT/ML injection Inject 40 units in am and 35 units in pm (Patient taking differently: Inject 40 Units into the skin 2 (two) times daily before a meal. ) 70 mL 1  . insulin regular (NOVOLIN R RELION) 100 units/mL injection Inject 0.25-0.3 mLs (25-30 Units total) into the skin 3 (three) times daily before meals. (Patient taking differently: Inject 30 Units into the skin 3 (three) times daily before meals. ) 70 mL 1  . isosorbide mononitrate (IMDUR) 30 MG 24 hr tablet TAKE 1 TABLET EVERY DAY 90 tablet 3  . loratadine (CLARITIN) 10 MG tablet Take 1 tablet (10 mg total) by mouth daily. 30 tablet 0  . metoprolol (LOPRESSOR) 50 MG tablet TAKE 1  TABLET TWICE DAILY 180 tablet 1  . nitroGLYCERIN (NITROSTAT) 0.4 MG SL tablet Place 1 tablet (0.4 mg total) under the tongue every 5 (five) minutes as needed for chest pain. 25 tablet 3  . pantoprazole (PROTONIX) 20 MG tablet Take 1 tablet (20 mg total) by mouth daily. 90 tablet 3  . prasugrel (EFFIENT) 10 MG TABS tablet Take 1 tablet (10 mg total) by mouth daily. 30 tablet 11  . tamsulosin (FLOMAX) 0.4 MG CAPS capsule TAKE 1 CAPSULE EVERY DAY 90 capsule 1   No current facility-administered medications on file prior to visit.     Pulse 99   Temp 98.3 F (36.8 C) (Oral)   Ht 5\' 9"  (1.753 m)   Wt 259 lb (117.5 kg)   SpO2 96%   BMI 38.25 kg/m       Objective:   Physical Exam  General  Mental Status - Alert. General Appearance - Well groomed. Not in acute distress.  Skin Rashes- No Rashes.  HEENT Head- Normal. Ear Auditory Canal - Left- Normal. Right - Normal.Tympanic Membrane- Left- Normal. Right- Normal. Eye Sclera/Conjunctiva- Left- Normal. Right- Normal. Nose & Sinuses Nasal Mucosa- Left-  Boggy and Congested. Right-  Boggy and  Congested.Bilateral maxillary and frontal sinus pressure. Mouth & Throat Lips: Upper Lip- Normal: no dryness, cracking, pallor, cyanosis, or vesicular eruption. Lower Lip-Normal: no dryness, cracking, pallor, cyanosis or vesicular eruption. Buccal Mucosa- Bilateral- No Aphthous ulcers. Oropharynx- No Discharge or Erythema. Tonsils: Characteristics- Bilateral- No Erythema or Congestion. Size/Enlargement- Bilateral- No enlargement. Discharge- bilateral-None.  Neck Neck- Supple.  No Masses. No jvd.   Chest and Lung Exam Auscultation: Breath Sounds:-Clear even and unlabored.  Cardiovascular Auscultation:Rythm- Regular, rate and rhythm. Murmurs & Other Heart Sounds:Ausculatation of the heart reveal- No Murmurs.  Lymphatic Head & Neck General Head & Neck Lymphatics: Bilateral: Description- No Localized lymphadenopathy.  Anterior  thorax-  lower ribs tender to palpation.  Lower ext- no pedal edema. Neg homans signs.         Assessment & Plan:   You appear to have bronchitis and sinusitis. Rest hydrate and tylenol for fever. I am prescribing cough medicine hycodan, and doxycycline  antibiotic. For your nasal congestion rx flonase nasal steroid.   Possible pneumonia. Want to get cxr today to evaluate.  Reviewed you recent hospitalizations. Will recheck cbc, cmp, troponin today. Depending on labs might advise ED evaluation again. Also if symptoms worsen again as before then ED evaluation.  Follow up in 5 days or as needed  Pt states can tolerate hycodan. Has used in past per pt. Wife thinks it affects his behavior some. So advised use only at night and consider even low dose of 2.5 ml to start with.  Pt cxr showed no pneumonia. Wbc not elevated. cmp look ok. Only mild sugar increase and mild low gfr. Troponin was normal. I advised pt tonght on results.

## 2016-08-05 NOTE — Progress Notes (Signed)
Pre visit review using our clinic tool,if applicable. No additional management support is needed unless otherwise documented below in the visit note.  

## 2016-08-05 NOTE — Telephone Encounter (Signed)
Transition Care Management Follow-up Telephone Call  Per Discharge Summary:  Admit date: 08/01/2016 Discharge date: 08/04/2016  Primary Care Provider: Penni Homans Primary Cardiologist: Dr. Stanford Breed  Discharge Diagnoses    Principal Problem:   Chest pain Active Problems:   CAD (coronary artery disease)   Essential hypertension   SOB (shortness of breath)   Hypertriglyceridemia   Uncontrolled type 2 diabetes mellitus with circulatory disorder, with long-term current use of insulin (HCC)   CKD (chronic kidney disease), stage III   Dyspnea   Pressure injury of skin  --   How have you been since you were released from the hospital? "Not feeling too good." Pt states he is still recovering but is overall feeling much better than when he went in to hospital.   Do you understand why you were in the hospital? yes   Do you understand the discharge instructions? yes   Where were you discharged to? Home   Items Reviewed:  Medications reviewed: no, will review at appt today  Allergies reviewed: no, will review at appt today  Dietary changes reviewed: yes, heart healthy  Referrals reviewed: no, none made   Functional Questionnaire:   Activities of Daily Living (ADLs):   He states they are independent in the following: ambulation, bathing and hygiene, feeding, continence, grooming, toileting and dressing States they require assistance with the following: none   Any transportation issues/concerns?: no   Any patient concerns? no   Confirmed importance and date/time of follow-up visits scheduled yes  Provider Appointment booked with Mackie Pai, PA-C 08/05/16 @ 1:00pm  Confirmed with patient if condition begins to worsen call PCP or go to the ER.  Patient was given the office number and encouraged to call back with question or concerns.  : yes

## 2016-08-06 ENCOUNTER — Other Ambulatory Visit: Payer: Self-pay

## 2016-08-06 DIAGNOSIS — I25119 Atherosclerotic heart disease of native coronary artery with unspecified angina pectoris: Secondary | ICD-10-CM

## 2016-08-06 NOTE — Progress Notes (Signed)
Referral to Cardiology, Kirk Ruths, MD

## 2016-08-11 ENCOUNTER — Other Ambulatory Visit: Payer: Self-pay | Admitting: Family Medicine

## 2016-08-12 ENCOUNTER — Encounter: Payer: Self-pay | Admitting: Physician Assistant

## 2016-08-12 ENCOUNTER — Ambulatory Visit (INDEPENDENT_AMBULATORY_CARE_PROVIDER_SITE_OTHER): Payer: Commercial Managed Care - HMO | Admitting: Physician Assistant

## 2016-08-12 VITALS — BP 118/62 | HR 78 | Ht 70.5 in | Wt 263.2 lb

## 2016-08-12 DIAGNOSIS — I2510 Atherosclerotic heart disease of native coronary artery without angina pectoris: Secondary | ICD-10-CM | POA: Diagnosis not present

## 2016-08-12 DIAGNOSIS — E785 Hyperlipidemia, unspecified: Secondary | ICD-10-CM | POA: Diagnosis not present

## 2016-08-12 DIAGNOSIS — I10 Essential (primary) hypertension: Secondary | ICD-10-CM

## 2016-08-12 DIAGNOSIS — E118 Type 2 diabetes mellitus with unspecified complications: Secondary | ICD-10-CM

## 2016-08-12 DIAGNOSIS — Z794 Long term (current) use of insulin: Secondary | ICD-10-CM

## 2016-08-12 NOTE — Progress Notes (Signed)
Cardiology Office Note    Date:  08/12/2016   ID:  William Hendrix, DOB December 11, 1940, MRN [id number]  PCP:  Penni Homans, MD  Cardiologist:  Dr. Stanford Breed  Chief Complaint  Patient presents with  . Hospitalization Follow-up    seen for Dr. Stanford Breed.     History of Present Illness:  William Hendrix is a 75 y.o. male with AAA, CAD, chronic diastolic HF, CKD stage III, HTN, HLD, OSA and DMII. He had previous stent to LAD 2000, possible spasm by cath in 2001. He also received a drug-eluting stent to mid LAD in August 2014 followed by another drug eluting stent to ostial PDA in October 2014. He recently underwent PCI to left circumflex and the mid RCA on 07/30/2016. He presented back to the hospital on 08/02/2016 with dyspnea on exertion that has been progressive and also orthopnea. Patient was admitted to cardiology service, initial d-dimer came back > 20. VQ scan obtained on 08/02/2016 was negative for ventilation perfusion mismatch to suggest PE. Subsequent venous Doppler of lower extremity was negative. Echocardiogram obtained on 10/23 showed EF [id number], grade 1 diastolic dysfunction, overall limited study due to poor acoustic windows despite the use of Definity contrast. He did have mildly elevated troponin which was felt that this could be coming down from the recent NSTEMI. His symptom was felt to be more upper respiratory in nature. He was switched from Brilinta to Effient and given antihistamine for nasal congestion. He ambulated well without further discomfort and was subsequently discharged on 10/24.  He presents today for post hospital followup. He has been seen by [facility] as transition of care visit. Otherwise he has been doing well, he says his shortness of breath has completely resolved. He has some issues with his teeth and is planning to see a dentist, he also had a back problem as well. I told him if surgery is elective, we would prefer him to delay the surgery until later  time.   Past Medical History:  Diagnosis Date  . AAA (abdominal aortic aneurysm) ([place]) 12/15/2014   a. Mild aneurysmal dilatation of the infrarenal abdominal aorta 3.2 cm - f/u due by 2019.  . Arthritis    "all over" (07/30/2016)  . CAD (coronary artery disease)    a. stent to LAD 2000. b. possible spasm by cath 2001. c. IVUS/PTCA/DES to mLAD 05/2013. d. PTCA/DES of dRCA into ostial rPDA 07/2013. e. PTCA of OM2, DES to Cx, DES to [facility] 07/2016.  Marland Kitchen Chronic bronchitis ([place])   . Chronic diastolic CHF (congestive heart failure) ([place])   . Chronic lower back pain   . Chronic neck pain   . CKD (chronic kidney disease), stage III   . Diabetic peripheral neuropathy ([place])   . Ejection fraction    55%, 07/2010, mild inferior hypo  . GERD (gastroesophageal reflux disease)   . Gout   . H/O diverticulitis of colon 01/31/2015  . H/O hiatal hernia   . History of blood transfusion ~ 10/1940   "had pneumonia"  . HTN (hypertension)   . Hyperlipidemia   . Melanoma of forearm, left ([place]) 02/2011   with wide excision   . Nephrolithiasis   . Obesity   . OSA on CPAP     [name] since 2000  . Positive D-dimer    a. significant elevation ,hospital 07/2010, etiology unclear. b. D Dimer chronically > 20.  . Skin cancer   . Spinal stenosis    a. s/p surgical repair  2013  . Type II diabetes mellitus ([place])   . Ventral hernia     Past Surgical History:  Procedure Laterality Date  . BACK SURGERY    . CARDIAC CATHETERIZATION N/A 07/30/2016   Procedure: Left Heart Cath and Coronary Angiography;  Surgeon: Nelva Bush, MD;  Location: [place] CV LAB;  Service: Cardiovascular;  Laterality: N/A;  . CARDIAC CATHETERIZATION N/A 07/30/2016   Procedure: Coronary Stent Intervention;  Surgeon: Nelva Bush, MD;  Location: [place] CV LAB;  Service: Cardiovascular;  Laterality: N/A;  Mid CFX and MID RCA  . CARDIAC CATHETERIZATION N/A 07/30/2016   Procedure: Coronary Balloon Angioplasty;  Surgeon:  Nelva Bush, MD;  Location: [place] CV LAB;  Service: Cardiovascular;  Laterality: N/A;  OM 1  . CARPAL TUNNEL RELEASE Left   . CATARACT EXTRACTION W/ INTRAOCULAR LENS  IMPLANT, BILATERAL Bilateral ~ 2014  . CERVICAL DISC SURGERY  1990s  . CORONARY ANGIOPLASTY WITH STENT PLACEMENT  05/2013  . CORONARY ANGIOPLASTY WITH STENT PLACEMENT  07/26/2013   DES to RCA extending to PDA    . CORONARY ANGIOPLASTY WITH STENT PLACEMENT  07/30/2016  . CORONARY ANGIOPLASTY WITH STENT PLACEMENT  2000   CAD  . DENTAL SURGERY  04/2016   "got infected; had to dig it out"  . KNEE ARTHROSCOPY Right 1990's   rt  . LEFT AND RIGHT HEART CATHETERIZATION WITH CORONARY ANGIOGRAM N/A 07/26/2013   Procedure: LEFT AND RIGHT HEART CATHETERIZATION WITH CORONARY ANGIOGRAM;  Surgeon: [name], MD;  Location: [place] CATH LAB;  Service: Cardiovascular;  Laterality: N/A;  . LEFT HEART CATHETERIZATION WITH CORONARY ANGIOGRAM N/A 05/19/2013   Procedure: LEFT HEART CATHETERIZATION WITH CORONARY ANGIOGRAM;  Surgeon: [name], MD;  Location[facility] CATH LAB;  Service: Cardiovascular;  Laterality: N/A;  . LUMBAR LAMINECTOMY/DECOMPRESSION MICRODISCECTOMY  08/30/2012   Procedure: LUMBAR LAMINECTOMY/DECOMPRESSION MICRODISCECTOMY 2 LEVELS;  Surgeon: [name], MD;  Location: [place] NEURO ORS;  Service: Neurosurgery;  Laterality: Bilateral;  Bilateral Lumbar three-four Lumbar four-five Laminotomies  . LUMBAR SPINE SURGERY  04/07/2016   Dr. Ellene Route; "?ruptured disc"  . MELANOMA EXCISION Left 02/2011   forearm  . NASAL SINUS SURGERY  1970s   "cut windows in sinus pockets"  . PERCUTANEOUS CORONARY STENT INTERVENTION (PCI-S)  05/19/2013   Procedure: PERCUTANEOUS CORONARY STENT INTERVENTION (PCI-S);  Surgeon: [name], MD;  Location: [facility] CATH LAB;  Service: Cardiovascular;;  . ULNAR TUNNEL RELEASE Left 04/07/2016   Dr. Ellene Route    Current Medications: Outpatient Medications Prior to Visit  Medication Sig Dispense Refill  .  acetaminophen (TYLENOL) 500 MG tablet Take 1,000 mg by mouth 2 (two) times daily.    Marland Kitchen albuterol (PROVENTIL HFA;VENTOLIN HFA) 108 (90 BASE) MCG/ACT inhaler Inhale 2 puffs into the lungs every 6 (six) hours as needed for wheezing or shortness of breath. 18 Inhaler 2  . allopurinol (ZYLOPRIM) 300 MG tablet TAKE 1 TABLET EVERY DAY 90 tablet 1  . aspirin EC 81 MG tablet Take 81 mg by mouth daily.    Marland Kitchen atorvastatin (LIPITOR) 40 MG tablet Take 1 tablet (40 mg total) by mouth daily. 90 tablet 0  . B Complex Vitamins (B COMPLEX-B12 PO) Take 1 tablet by mouth at bedtime.    . Cholecalciferol (VITAMIN D) 1000 UNITS capsule Take 1,000 Units by mouth at bedtime.     . cyclobenzaprine (FLEXERIL) 5 MG tablet Take 1 tablet (5 mg total) by mouth 3 (three) times daily as needed for muscle spasms. (Patient taking differently: Take  5 mg by mouth 2 (two) times daily as needed for muscle spasms. ) 30 tablet 1  . doxycycline (VIBRA-TABS) 100 MG tablet Take 1 tablet (100 mg total) by mouth 2 (two) times daily. Can give caps or generic. 20 tablet 0  . fenofibrate 160 MG tablet TAKE 1 TABLET EVERY DAY (Patient taking differently: TAKE 1 TABLET EVERY night) 90 tablet 1  . fluocinonide cream (LIDEX) [id number] % Apply 1 application topically daily as needed. FOR RASH    . fluticasone (FLONASE) 50 MCG/ACT nasal spray Place 2 sprays into both nostrils daily. (Patient taking differently: Place 2 sprays into both nostrils as needed. ) 16 g 1  . fluticasone (FLONASE) 50 MCG/ACT nasal spray Place 2 sprays into both nostrils daily. 16 g 1  . furosemide (LASIX) 80 MG tablet Take 0.5 tablets (40 mg total) by mouth daily. 45 tablet 1  . HYDROcodone-homatropine (HYCODAN) 5-1.5 MG/5ML syrup Take 5 mLs by mouth every 8 (eight) hours as needed for cough. 120 mL 0  . insulin NPH Human (NOVOLIN N) 100 UNIT/ML injection Inject 40 units in am and 35 units in pm (Patient taking differently: Inject 40 Units into the skin 2 (two) times daily before a  meal. ) 70 mL 1  . insulin regular (NOVOLIN R RELION) 100 units/mL injection Inject 0.25-0.3 mLs (25-30 Units total) into the skin 3 (three) times daily before meals. (Patient taking differently: Inject 30 Units into the skin 3 (three) times daily before meals. ) 70 mL 1  . isosorbide mononitrate (IMDUR) 30 MG 24 hr tablet TAKE 1 TABLET EVERY DAY 90 tablet 3  . loratadine (CLARITIN) 10 MG tablet Take 1 tablet (10 mg total) by mouth daily. 30 tablet 0  . metoprolol (LOPRESSOR) 50 MG tablet TAKE 1 TABLET TWICE DAILY 180 tablet 1  . nitroGLYCERIN (NITROSTAT) 0.4 MG SL tablet Place 1 tablet (0.4 mg total) under the tongue every 5 (five) minutes as needed for chest pain. 25 tablet 3  . pantoprazole (PROTONIX) 20 MG tablet Take 1 tablet (20 mg total) by mouth daily. 90 tablet 3  . prasugrel (EFFIENT) 10 MG TABS tablet Take 1 tablet (10 mg total) by mouth daily. 30 tablet 11  . tamsulosin (FLOMAX) 0.4 MG CAPS capsule TAKE 1 CAPSULE EVERY DAY 90 capsule 1   No facility-administered medications prior to visit.      Allergies:   Levemir [insulin detemir]; Codeine; Lipitor [atorvastatin]; and Novolog mix [insulin aspart prot & aspart]   Social History   Social History  . Marital status: Married    Spouse name: N/A  . Number of children: N/A  . Years of education: N/A   Occupational History  . [employer]    Social History Main Topics  . Smoking status: Former Smoker    Packs/day: 3.00    Years: 30.00    Types: Cigarettes    Quit date: 09/17/1996  . Smokeless tobacco: Never Used  . Alcohol use No  . Drug use: No  . Sexual activity: Not Currently    Birth control/ protection: None   Other Topics Concern  . None   Social History Narrative   Married and lives locally with his wife.  [employer].     Family History:  The patient's family history includes Cancer in his father and mother; Heart disease in his mother and sister; Hypertension in his brother and sister; Leukemia in his sister;  Migraines in his daughter.   ROS:   Please see the history of  present illness.    ROS All other systems reviewed and are negative.   PHYSICAL EXAM:   VS:  BP 118/62   Pulse 78   Ht 5' 10.5" (1.791 m)   Wt 263 lb 3.2 oz (119.4 kg)   BMI 37.23 kg/m    GEN: Well nourished, well developed, in no acute distress  HEENT: normal  Neck: no JVD, carotid bruits, or masses Cardiac: RRR; no murmurs, rubs, or gallops,no edema  Respiratory:  clear to auscultation bilaterally, normal work of breathing GI: soft, nontender, nondistended, + BS MS: no deformity or atrophy  Skin: warm and dry, no rash Neuro:  Alert and Oriented x 3, Strength and sensation are intact Psych: euthymic mood, full affect  Wt Readings from Last 3 Encounters:  08/12/16 263 lb 3.2 oz (119.4 kg)  08/05/16 259 lb (117.5 kg)  08/04/16 254 lb 3.2 oz (115.3 kg)      Studies/Labs Reviewed:   EKG:  EKG is  ordered today.  The ekg ordered today demonstrates Normal sinus rhythm with left anterior fascicular block, heart rate 78.  Recent Labs: 05/15/2016: TSH 3.19 08/02/2016: B Natriuretic Peptide 19.7; Magnesium 1.8 08/05/2016: ALT 16; BUN 28; Creat 1.47; Hemoglobin 14.4; Platelets 244.0; Potassium 4.0; Sodium 138   Lipid Panel    Component Value Date/Time   CHOL 133 05/15/2016 1117   TRIG 214.0 (H) 05/15/2016 1117   TRIG 203 (HH) 09/17/2006 0812   HDL 26.80 (L) 05/15/2016 1117   CHOLHDL 5 05/15/2016 1117   VLDL 42.8 (H) 05/15/2016 1117   LDLCALC 31 04/09/2014 0858   LDLDIRECT 73.0 05/15/2016 1117    Additional studies/ records that were reviewed today include:   Cath 07/31/16 Conclusions: 1.         Two-vessel coronary artery disease, including 80% mid LCx stenosis with the appearance of a ruptured plaque involving the ostium of OM2, as well as hazy, ulcerated 50% stenosis in the mid RCA concerning for unstable plaque. 2.         Widely patent stents in the proximal LAD and distal RCA/ostial PDA. 3.          Upper normal to mildly elevated left ventricular filling pressure. 4.         Successful PCI to mid LCx and balloon angioplasty to the ostium of OM2 with placement of a Promus Premier 3.0 x 16 mm drug-eluting stent with final kissing balloon inflation. 5.         Successful PCI to mid RCA with placement of a Promus Premier 3.5 x 28 mm drug-eluting stent.  Recommendations: 1.         Dual antiplatelet therapy with aspirin and ticagrelor. The patient is a candidate for the Twilight study evaluating long-term antiplatelet therapy following PCI. 2.         Continue aggressive secondary prevention.   Last echo 2014 Study Conclusions  - Left ventricle: The cavity size was normal. Wall thickness was increased in a pattern of mild LVH. Systolic function was normal. The estimated ejection fraction was in the range of 55% to 60%. Wall motion was normal; there were no regional wall motion abnormalities. Doppler parameters are consistent with abnormal left ventricular relaxation (grade 1 diastolic dysfunction). - Right ventricle: The cavity size was mildly dilated. Impressions:  - Technically difficult; definity used.   V/Q scan 10/33/2017  FINDINGS: Ventilation: No focal ventilation defect.  Perfusion: No wedge shaped peripheral perfusion defects to suggest acute pulmonary embolism.  IMPRESSION: No evidence of  ventilation perfusion mismatch to suggest pulmonary embolism.   LE venous doppler 08/03/2016 Summary:  - No evidence of deep vein or superficial thrombosis involving the   right lower extremity and left lower extremity. - No evidence of Baker&'s cyst on the right or left.   Echo 08/03/2016 LV EF: 45% -   50%  - Left ventricle: The cavity size was normal. Wall thickness was   increased in a pattern of mild LVH. Systolic function was mildly   reduced. The estimated ejection fraction was in the range of 45%   to 50%. Doppler parameters are consistent  with abnormal left   ventricular relaxation (grade 1 diastolic dysfunction). - Right ventricle: The cavity size was mildly dilated. Systolic   function was mildly reduced. - Pericardium, extracardiac: A trivial pericardial effusion was   identified. - Impressions: Poor acoustic windows limit study, even with the use   of Definity.  Impressions:  - Poor acoustic windows limit study, even with the use of Definity.  ASSESSMENT:    1. Coronary artery disease involving native coronary artery of native heart without angina pectoris   2. Essential hypertension   3. Hyperlipidemia, unspecified hyperlipidemia type   4. Controlled type 2 diabetes mellitus with complication, with long-term current use of insulin (HCC)      PLAN:  In order of problems listed above:  1. CAD  -  previous stent to LAD 2000, possible spasm by cath in 2001.   - drug-eluting stent to mid LAD in August 2014 followed by another drug eluting stent to ostial PDA in October 2014  - PCI to left circumflex and the mid RCA on 07/30/2016  - Continue aspirin and Effient. No chest pain. Shortness breath resolved after switching Brilinta to Effient. Unclear cause for d-dimer > 20, lower extremity venous Doppler and VQ scan are both negative.  - He has visited with Dr. Stanford Breed in one week, however given how stable he is, we can potentially push it back for 1 month.  2. HTN  - controlled on Imdur and metoprolol.  3. HLD  - Continue Lipitor 40 mg daily.  4. DM II: On insulin  5. CKD III: Stable after cath based on recent basic metabolic panel.    Medication Adjustments/Labs and Tests Ordered: Current medicines are reviewed at length with the patient today.  Concerns regarding medicines are outlined above.  Medication changes, Labs and Tests ordered today are listed in the Patient Instructions below. Patient Instructions  Medication Instructions:  Your physician recommends that you continue on your current  medications as directed. Please refer to the Current Medication list given to you today.  Labwork: none  Testing/Procedures: None   Follow-Up: Your physician recommends that you schedule a follow-up appointment in: 12/6 with Dr Stanford Breed at the [facility] office    If you need a refill on your cardiac medications before your next appointment, please call your pharmacy.     Hilbert Corrigan, [place]  08/12/2016 1:17 PM    [facility] [place], [place], [place]  [address] Phone: [phone]; Fax: [phone]

## 2016-08-12 NOTE — Patient Instructions (Addendum)
Medication Instructions:  Your physician recommends that you continue on your current medications as directed. Please refer to the Current Medication list given to you today.  Labwork: none  Testing/Procedures: None   Follow-Up: Your physician recommends that you schedule a follow-up appointment in: 12/6 with Dr Stanford Breed at the [facility] office    If you need a refill on your cardiac medications before your next appointment, please call your pharmacy.

## 2016-08-17 ENCOUNTER — Ambulatory Visit (INDEPENDENT_AMBULATORY_CARE_PROVIDER_SITE_OTHER): Payer: Commercial Managed Care - HMO | Admitting: Family Medicine

## 2016-08-17 ENCOUNTER — Encounter: Payer: Self-pay | Admitting: Family Medicine

## 2016-08-17 VITALS — BP 130/72 | HR 82 | Temp 97.8°F | Ht 70.5 in | Wt 263.5 lb

## 2016-08-17 DIAGNOSIS — I2510 Atherosclerotic heart disease of native coronary artery without angina pectoris: Secondary | ICD-10-CM | POA: Diagnosis not present

## 2016-08-17 DIAGNOSIS — E663 Overweight: Secondary | ICD-10-CM

## 2016-08-17 DIAGNOSIS — M5416 Radiculopathy, lumbar region: Secondary | ICD-10-CM | POA: Diagnosis not present

## 2016-08-17 DIAGNOSIS — E1159 Type 2 diabetes mellitus with other circulatory complications: Secondary | ICD-10-CM | POA: Diagnosis not present

## 2016-08-17 DIAGNOSIS — I10 Essential (primary) hypertension: Secondary | ICD-10-CM

## 2016-08-17 DIAGNOSIS — IMO0002 Reserved for concepts with insufficient information to code with codable children: Secondary | ICD-10-CM

## 2016-08-17 DIAGNOSIS — M5441 Lumbago with sciatica, right side: Secondary | ICD-10-CM | POA: Diagnosis not present

## 2016-08-17 DIAGNOSIS — Z794 Long term (current) use of insulin: Secondary | ICD-10-CM

## 2016-08-17 DIAGNOSIS — E1165 Type 2 diabetes mellitus with hyperglycemia: Secondary | ICD-10-CM

## 2016-08-17 MED ORDER — CYCLOBENZAPRINE HCL 5 MG PO TABS: 5.0000 mg | ORAL_TABLET | Freq: Two times a day (BID) | ORAL | 2 refills | Status: DC | PRN

## 2016-08-17 NOTE — Patient Instructions (Signed)
Lidocaine gel Salon Pas or Aspercreme makes it. Lidocaine Salon Pas, Aspercreme or Icy Hot  Basic Carbohydrate Counting for Diabetes Mellitus Carbohydrate counting is a method for keeping track of the amount of carbohydrates you eat. Eating carbohydrates naturally increases the level of sugar (glucose) in your blood, so it is important for you to know the amount that is okay for you to have in every meal. Carbohydrate counting helps keep the level of glucose in your blood within normal limits. The amount of carbohydrates allowed is different for every person. A dietitian can help you calculate the amount that is right for you. Once you know the amount of carbohydrates you can have, you can count the carbohydrates in the foods you want to eat. Carbohydrates are found in the following foods:  Grains, such as breads and cereals.  Dried beans and soy products.  Starchy vegetables, such as potatoes, peas, and corn.  Fruit and fruit juices.  Milk and yogurt.  Sweets and snack foods, such as cake, cookies, candy, chips, soft drinks, and fruit drinks. CARBOHYDRATE COUNTING There are two ways to count the carbohydrates in your food. You can use either of the methods or a combination of both. Reading the "Nutrition Facts" on [place] The "Nutrition Facts" is an area that is included on the labels of almost all packaged food and beverages in the [place]. It includes the serving size of that food or beverage and information about the nutrients in each serving of the food, including the grams (g) of carbohydrate per serving.  Decide the number of servings of this food or beverage that you will be able to eat or drink. Multiply that number of servings by the number of grams of carbohydrate that is listed on the label for that serving. The total will be the amount of carbohydrates you will be having when you eat or drink this food or beverage. Learning Standard Serving Sizes of Food When you eat  food that is not packaged or does not include "Nutrition Facts" on the label, you need to measure the servings in order to count the amount of carbohydrates.A serving of most carbohydrate-rich foods contains about 15 g of carbohydrates. The following list includes serving sizes of carbohydrate-rich foods that provide 15 g ofcarbohydrate per serving:   1 slice of bread (1 oz) or 1 six-inch tortilla.    of a hamburger bun or English muffin.  4-6 crackers.   cup unsweetened dry cereal.    cup hot cereal.   cup rice or pasta.    cup mashed potatoes or  of a large baked potato.  1 cup fresh fruit or one small piece of fruit.    cup canned or frozen fruit or fruit juice.  1 cup milk.   cup plain fat-free yogurt or yogurt sweetened with artificial sweeteners.   cup cooked dried beans or starchy vegetable, such as peas, corn, or potatoes.  Decide the number of standard-size servings that you will eat. Multiply that number of servings by 15 (the grams of carbohydrates in that serving). For example, if you eat 2 cups of strawberries, you will have eaten 2 servings and 30 g of carbohydrates (2 servings x 15 g = 30 g). For foods such as soups and casseroles, in which more than one food is mixed in, you will need to count the carbohydrates in each food that is included. EXAMPLE OF CARBOHYDRATE COUNTING Sample Dinner  3 oz chicken breast.   cup of brown  rice.   cup of corn.  1 cup milk.   1 cup strawberries with sugar-free whipped topping.  Carbohydrate Calculation Step 1: Identify the foods that contain carbohydrates:   Rice.   Corn.   Milk.   Strawberries. Step 2:Calculate the number of servings eaten of each:   2 servings of rice.   1 serving of corn.   1 serving of milk.   1 serving of strawberries. Step 3: Multiply each of those number of servings by 15 g:   2 servings of rice x 15 g = 30 g.   1 serving of corn x 15 g = 15 g.   1  serving of milk x 15 g = 15 g.   1 serving of strawberries x 15 g = 15 g. Step 4: Add together all of the amounts to find the total grams of carbohydrates eaten: 30 g + 15 g + 15 g + 15 g = 75 g.   This information is not intended to replace advice given to you by your health care provider. Make sure you discuss any questions you have with your health care provider.   Document Released: 09/28/2005 Document Revised: 10/19/2014 Document Reviewed: 08/25/2013 Elsevier Interactive Patient Education [employer].

## 2016-08-17 NOTE — Progress Notes (Signed)
Pre visit review using our clinic review tool, if applicable. No additional management support is needed unless otherwise documented below in the visit note. 

## 2016-08-19 ENCOUNTER — Ambulatory Visit: Payer: Commercial Managed Care - HMO | Admitting: Cardiology

## 2016-08-23 NOTE — Progress Notes (Signed)
Patient ID: William Hendrix, male   DOB: Apr 14, 1941, 75 y.o.   MRN: [id number]   Subjective:    Patient ID: William Hendrix, male    DOB: 07/20/1941, 75 y.o.   MRN: [id number]  Chief Complaint  Patient presents with  . Follow-up    HPI Patient is in today for follow up. He has recently been hospitalized and two cardiac stents were placed. He tolerated the procedure but did not tolerate the Brillinta. Has done better on the Effient. He has some low back pain today but not severe. Had a surgery on his back in June and it had been doing better. Denies palp/SOB/HA/congestion/fevers/GI or GU c/o. Taking meds as prescribed  Past Medical History:  Diagnosis Date  . AAA (abdominal aortic aneurysm) ([place]) 12/15/2014   a. Mild aneurysmal dilatation of the infrarenal abdominal aorta 3.2 cm - f/u due by 2019.  . Arthritis    "all over" (07/30/2016)  . CAD (coronary artery disease)    a. stent to LAD 2000. b. possible spasm by cath 2001. c. IVUS/PTCA/DES to mLAD 05/2013. d. PTCA/DES of dRCA into ostial rPDA 07/2013. e. PTCA of OM2, DES to Cx, DES to [facility] 07/2016.  Marland Kitchen Chronic bronchitis ([place])   . Chronic diastolic CHF (congestive heart failure) ([place])   . Chronic lower back pain   . Chronic neck pain   . CKD (chronic kidney disease), stage III   . Diabetic peripheral neuropathy ([place])   . Ejection fraction    55%, 07/2010, mild inferior hypo  . GERD (gastroesophageal reflux disease)   . Gout   . H/O diverticulitis of colon 01/31/2015  . H/O hiatal hernia   . History of blood transfusion ~ 10/1940   "had pneumonia"  . HTN (hypertension)   . Hyperlipidemia   . Melanoma of forearm, left ([place]) 02/2011   with wide excision   . Nephrolithiasis   . Obesity   . OSA on CPAP     [name] since 2000  . Positive D-dimer    a. significant elevation ,hospital 07/2010, etiology unclear. b. D Dimer chronically > 20.  . Skin cancer   . Spinal stenosis    a. s/p surgical repair 2013  . Type II diabetes  mellitus ([place])   . Ventral hernia     Past Surgical History:  Procedure Laterality Date  . BACK SURGERY    . CARDIAC CATHETERIZATION N/A 07/30/2016   Procedure: Left Heart Cath and Coronary Angiography;  Surgeon: Nelva Bush, MD;  Location: [place] CV LAB;  Service: Cardiovascular;  Laterality: N/A;  . CARDIAC CATHETERIZATION N/A 07/30/2016   Procedure: Coronary Stent Intervention;  Surgeon: Nelva Bush, MD;  Location: [place] CV LAB;  Service: Cardiovascular;  Laterality: N/A;  Mid CFX and MID RCA  . CARDIAC CATHETERIZATION N/A 07/30/2016   Procedure: Coronary Balloon Angioplasty;  Surgeon: Nelva Bush, MD;  Location: [place] CV LAB;  Service: Cardiovascular;  Laterality: N/A;  OM 1  . CARPAL TUNNEL RELEASE Left   . CATARACT EXTRACTION W/ INTRAOCULAR LENS  IMPLANT, BILATERAL Bilateral ~ 2014  . CERVICAL DISC SURGERY  1990s  . CORONARY ANGIOPLASTY WITH STENT PLACEMENT  05/2013  . CORONARY ANGIOPLASTY WITH STENT PLACEMENT  07/26/2013   DES to RCA extending to PDA    . CORONARY ANGIOPLASTY WITH STENT PLACEMENT  07/30/2016  . CORONARY ANGIOPLASTY WITH STENT PLACEMENT  2000   CAD  . DENTAL SURGERY  04/2016   "got infected; had to dig it out"  .  KNEE ARTHROSCOPY Right 1990's   rt  . LEFT AND RIGHT HEART CATHETERIZATION WITH CORONARY ANGIOGRAM N/A 07/26/2013   Procedure: LEFT AND RIGHT HEART CATHETERIZATION WITH CORONARY ANGIOGRAM;  Surgeon: [name], MD;  Location: [place] CATH LAB;  Service: Cardiovascular;  Laterality: N/A;  . LEFT HEART CATHETERIZATION WITH CORONARY ANGIOGRAM N/A 05/19/2013   Procedure: LEFT HEART CATHETERIZATION WITH CORONARY ANGIOGRAM;  Surgeon: [name], MD;  Location: [facility] CATH LAB;  Service: Cardiovascular;  Laterality: N/A;  . LUMBAR LAMINECTOMY/DECOMPRESSION MICRODISCECTOMY  08/30/2012   Procedure: LUMBAR LAMINECTOMY/DECOMPRESSION MICRODISCECTOMY 2 LEVELS;  Surgeon: [name], MD;  Location: [place] NEURO ORS;  Service: Neurosurgery;   Laterality: Bilateral;  Bilateral Lumbar three-four Lumbar four-five Laminotomies  . LUMBAR SPINE SURGERY  04/07/2016   Dr. Ellene Route; "?ruptured disc"  . MELANOMA EXCISION Left 02/2011   forearm  . NASAL SINUS SURGERY  1970s   "cut windows in sinus pockets"  . PERCUTANEOUS CORONARY STENT INTERVENTION (PCI-S)  05/19/2013   Procedure: PERCUTANEOUS CORONARY STENT INTERVENTION (PCI-S);  Surgeon: [name], MD;  Location: [facility] CATH LAB;  Service: Cardiovascular;;  . ULNAR TUNNEL RELEASE Left 04/07/2016   Dr. Ellene Route    Family History  Problem Relation Age of Onset  . Cancer Mother     intestinal   . Heart disease Mother   . Cancer Father     prostate  . Migraines Daughter   . Leukemia Sister   . Heart disease Sister   . Prostate cancer    . Kidney cancer    . Cancer      Bladder cancer  . Coronary artery disease    . Hypertension Sister   . Hypertension Brother   . Heart attack Neg Hx   . Stroke Neg Hx     Social History   Social History  . Marital status: Married    Spouse name: N/A  . Number of children: N/A  . Years of education: N/A   Occupational History  . [employer]    Social History Main Topics  . Smoking status: Former Smoker    Packs/day: 3.00    Years: 30.00    Types: Cigarettes    Quit date: 09/17/1996  . Smokeless tobacco: Never Used  . Alcohol use No  . Drug use: No  . Sexual activity: Not Currently    Birth control/ protection: None   Other Topics Concern  . Not on file   Social History Narrative   Married and lives locally with his wife.  [employer].    Outpatient Medications Prior to Visit  Medication Sig Dispense Refill  . acetaminophen (TYLENOL) 500 MG tablet Take 1,000 mg by mouth 2 (two) times daily.    Marland Kitchen albuterol (PROVENTIL HFA;VENTOLIN HFA) 108 (90 BASE) MCG/ACT inhaler Inhale 2 puffs into the lungs every 6 (six) hours as needed for wheezing or shortness of breath. 18 Inhaler 2  . allopurinol (ZYLOPRIM) 300 MG tablet TAKE 1 TABLET EVERY  DAY 90 tablet 1  . aspirin EC 81 MG tablet Take 81 mg by mouth daily.    Marland Kitchen atorvastatin (LIPITOR) 40 MG tablet Take 1 tablet (40 mg total) by mouth daily. 90 tablet 0  . B Complex Vitamins (B COMPLEX-B12 PO) Take 1 tablet by mouth at bedtime.    . Cholecalciferol (VITAMIN D) 1000 UNITS capsule Take 1,000 Units by mouth at bedtime.     Marland Kitchen doxycycline (VIBRA-TABS) 100 MG tablet Take 1 tablet (100 mg total) by mouth 2 (two) times daily. Can  give caps or generic. 20 tablet 0  . fenofibrate 160 MG tablet TAKE 1 TABLET EVERY DAY (Patient taking differently: TAKE 1 TABLET EVERY night) 90 tablet 1  . fluocinonide cream (LIDEX) [id number] % Apply 1 application topically daily as needed. FOR RASH    . fluticasone (FLONASE) 50 MCG/ACT nasal spray Place 2 sprays into both nostrils daily. (Patient taking differently: Place 2 sprays into both nostrils as needed. ) 16 g 1  . fluticasone (FLONASE) 50 MCG/ACT nasal spray Place 2 sprays into both nostrils daily. 16 g 1  . furosemide (LASIX) 80 MG tablet Take 0.5 tablets (40 mg total) by mouth daily. 45 tablet 1  . HYDROcodone-homatropine (HYCODAN) 5-1.5 MG/5ML syrup Take 5 mLs by mouth every 8 (eight) hours as needed for cough. 120 mL 0  . insulin NPH Human (NOVOLIN N) 100 UNIT/ML injection Inject 40 units in am and 35 units in pm (Patient taking differently: Inject 40 Units into the skin 2 (two) times daily before a meal. ) 70 mL 1  . insulin regular (NOVOLIN R RELION) 100 units/mL injection Inject 0.25-0.3 mLs (25-30 Units total) into the skin 3 (three) times daily before meals. (Patient taking differently: Inject 30 Units into the skin 3 (three) times daily before meals. ) 70 mL 1  . isosorbide mononitrate (IMDUR) 30 MG 24 hr tablet TAKE 1 TABLET EVERY DAY 90 tablet 3  . loratadine (CLARITIN) 10 MG tablet Take 1 tablet (10 mg total) by mouth daily. 30 tablet 0  . metoprolol (LOPRESSOR) 50 MG tablet TAKE 1 TABLET TWICE DAILY 180 tablet 1  . nitroGLYCERIN (NITROSTAT) 0.4  MG SL tablet Place 1 tablet (0.4 mg total) under the tongue every 5 (five) minutes as needed for chest pain. 25 tablet 3  . pantoprazole (PROTONIX) 20 MG tablet Take 1 tablet (20 mg total) by mouth daily. 90 tablet 3  . prasugrel (EFFIENT) 10 MG TABS tablet Take 1 tablet (10 mg total) by mouth daily. 30 tablet 11  . tamsulosin (FLOMAX) 0.4 MG CAPS capsule TAKE 1 CAPSULE EVERY DAY 90 capsule 1  . cyclobenzaprine (FLEXERIL) 5 MG tablet Take 1 tablet (5 mg total) by mouth 3 (three) times daily as needed for muscle spasms. (Patient taking differently: Take 5 mg by mouth 2 (two) times daily as needed for muscle spasms. ) 30 tablet 1   No facility-administered medications prior to visit.     Allergies  Allergen Reactions  . Levemir [Insulin Detemir] Swelling    Patient had redness and swelling and tenderness at injection site.  . Codeine Other (See Comments)    Makes him "shakey", is OK with hydrocodone GI UPSET & TREMORS  . Lipitor [Atorvastatin] Other (See Comments)    Muscle aches; liver functions. Patient is currently taken  . Novolog Mix [Insulin Aspart Prot & Aspart]     Causes skin to swell/itch at injection site  . Other     Brilinta caused SOB    Review of Systems  Constitutional: Positive for malaise/fatigue. Negative for fever.  HENT: Negative for congestion.   Eyes: Negative for blurred vision.  Respiratory: Negative for shortness of breath.   Cardiovascular: Negative for chest pain, palpitations and leg swelling.  Gastrointestinal: Negative for abdominal pain, blood in stool and nausea.  Genitourinary: Negative for dysuria and frequency.  Musculoskeletal: Positive for back pain. Negative for falls.  Skin: Negative for rash.  Neurological: Negative for dizziness, loss of consciousness and headaches.  Endo/Heme/Allergies: Negative for environmental allergies.  Psychiatric/Behavioral:  Negative for depression. The patient is not nervous/anxious.        Objective:      Physical Exam  Constitutional: He is oriented to person, place, and time. He appears well-developed and well-nourished. No distress.  HENT:  Head: Normocephalic and atraumatic.  Eyes: Conjunctivae are normal.  Neck: Neck supple. No thyromegaly present.  Cardiovascular: Normal rate, regular rhythm and normal heart sounds.   Pulmonary/Chest: Effort normal and breath sounds normal. No respiratory distress. He has no wheezes.  Abdominal: Soft. Bowel sounds are normal. He exhibits no mass. There is no tenderness.  Musculoskeletal: He exhibits no edema.  Lymphadenopathy:    He has no cervical adenopathy.  Neurological: He is alert and oriented to person, place, and time.  Skin: Skin is warm and dry.  Psychiatric: He has a normal mood and affect. His behavior is normal.    BP 130/72 (BP Location: Left Arm, Patient Position: Sitting, Cuff Size: Large)   Pulse 82   Temp 97.8 F (36.6 C) (Oral)   Ht 5' 10.5" (1.791 m)   Wt 263 lb 8 oz (119.5 kg)   SpO2 97%   BMI 37.27 kg/m  Wt Readings from Last 3 Encounters:  08/17/16 263 lb 8 oz (119.5 kg)  08/12/16 263 lb 3.2 oz (119.4 kg)  08/05/16 259 lb (117.5 kg)     Lab Results  Component Value Date   WBC 8.2 08/05/2016   HGB 14.4 08/05/2016   HCT 41.6 08/05/2016   PLT 244.0 08/05/2016   GLUCOSE 132 (H) 08/05/2016   CHOL 133 05/15/2016   TRIG 214.0 (H) 05/15/2016   HDL 26.80 (L) 05/15/2016   LDLDIRECT 73.0 05/15/2016   LDLCALC 31 04/09/2014   ALT 16 08/05/2016   AST 16 08/05/2016   NA 138 08/05/2016   K 4.0 08/05/2016   CL 101 08/05/2016   CREATININE 1.47 (H) 08/05/2016   BUN 28 (H) 08/05/2016   CO2 26 08/05/2016   TSH 3.19 05/15/2016   INR 1.0 07/25/2013   HGBA1C 7.0 05/28/2016   MICROALBUR 0.7 08/02/2014    Lab Results  Component Value Date   TSH 3.19 05/15/2016   Lab Results  Component Value Date   WBC 8.2 08/05/2016   HGB 14.4 08/05/2016   HCT 41.6 08/05/2016   MCV 91.5 08/05/2016   PLT 244.0 08/05/2016   Lab  Results  Component Value Date   NA 138 08/05/2016   K 4.0 08/05/2016   CO2 26 08/05/2016   GLUCOSE 132 (H) 08/05/2016   BUN 28 (H) 08/05/2016   CREATININE 1.47 (H) 08/05/2016   BILITOT 0.6 08/05/2016   ALKPHOS 41 08/05/2016   AST 16 08/05/2016   ALT 16 08/05/2016   PROT 7.0 08/05/2016   ALBUMIN 3.9 08/05/2016   CALCIUM 9.9 08/05/2016   ANIONGAP 10 08/02/2016   GFR 48.71 (L) 05/15/2016   Lab Results  Component Value Date   CHOL 133 05/15/2016   Lab Results  Component Value Date   HDL 26.80 (L) 05/15/2016   Lab Results  Component Value Date   LDLCALC 31 04/09/2014   Lab Results  Component Value Date   TRIG 214.0 (H) 05/15/2016   Lab Results  Component Value Date   CHOLHDL 5 05/15/2016   Lab Results  Component Value Date   HGBA1C 7.0 05/28/2016       Assessment & Plan:   Problem List Items Addressed This Visit    Overweight    Encouraged DASH diet, decrease po intake and  increase exercise as tolerated. Needs 7-8 hours of sleep nightly. Avoid trans fats, eat small, frequent meals every 4-5 hours with lean proteins, complex carbs and healthy fats. Minimize simple carbs      Essential hypertension    Well controlled, no changes to meds. Encouraged heart healthy diet such as the DASH diet and exercise as tolerated.       Lumbar radiculopathy    Had back surgery with Dr Ellene Route of neurosurgery on 6/27 and has generally healed well. Today he is having some mild pain. Encouraged moist heat and gentle stretching as tolerated. May try NSAIDs and prescription meds as directed and report if symptoms worsen or seek immediate care. Try Lidocaine patches and folllow up with neurosurg if worsens.      Relevant Medications   cyclobenzaprine (FLEXERIL) 5 MG tablet   Uncontrolled type 2 diabetes mellitus with circulatory disorder, with long-term current use of insulin (HCC)    minimize simple carbs. Increase exercise as tolerated. Continue current meds      CAD in native  artery    Recently hospitalized with chest pain and ultimately received 2 stents. Was initially placed on Brillinta but this caused SOB so he was switched to Effient and he has tolerated this better. Has follow up scheduled with cardiology       Other Visit Diagnoses    Right-sided low back pain with right-sided sciatica, unspecified chronicity    -  Primary   Relevant Medications   cyclobenzaprine (FLEXERIL) 5 MG tablet      I have changed Mr. Padalino cyclobenzaprine. I am also having him maintain his Vitamin D, fluocinonide cream, aspirin EC, acetaminophen, nitroGLYCERIN, fluticasone, albuterol, isosorbide mononitrate, insulin regular, fenofibrate, metoprolol, tamsulosin, atorvastatin, pantoprazole, insulin NPH Human, B Complex Vitamins (B COMPLEX-B12 PO), furosemide, loratadine, prasugrel, doxycycline, fluticasone, HYDROcodone-homatropine, and allopurinol.  Meds ordered this encounter  Medications  . cyclobenzaprine (FLEXERIL) 5 MG tablet    Sig: Take 1 tablet (5 mg total) by mouth 2 (two) times daily as needed for muscle spasms.    Dispense:  60 tablet    Refill:  2     Penni Homans, MD

## 2016-08-23 NOTE — Assessment & Plan Note (Signed)
Had back surgery with Dr Ellene Route of neurosurgery on 6/27 and has generally healed well. Today he is having some mild pain. Encouraged moist heat and gentle stretching as tolerated. May try NSAIDs and prescription meds as directed and report if symptoms worsen or seek immediate care. Try Lidocaine patches and folllow up with neurosurg if worsens.

## 2016-08-23 NOTE — Assessment & Plan Note (Signed)
minimize simple carbs. Increase exercise as tolerated. Continue current meds  

## 2016-08-23 NOTE — Assessment & Plan Note (Signed)
Well controlled, no changes to meds. Encouraged heart healthy diet such as the DASH diet and exercise as tolerated.  °

## 2016-08-23 NOTE — Assessment & Plan Note (Signed)
Encouraged DASH diet, decrease po intake and increase exercise as tolerated. Needs 7-8 hours of sleep nightly. Avoid trans fats, eat small, frequent meals every 4-5 hours with lean proteins, complex carbs and healthy fats. Minimize simple carbs 

## 2016-08-23 NOTE — Assessment & Plan Note (Addendum)
Recently hospitalized with chest pain and ultimately received 2 stents. Was initially placed on Brillinta but this caused SOB so he was switched to Effient and he has tolerated this better. Has follow up scheduled with cardiology

## 2016-08-28 ENCOUNTER — Encounter: Payer: Self-pay | Admitting: Internal Medicine

## 2016-08-28 ENCOUNTER — Ambulatory Visit (INDEPENDENT_AMBULATORY_CARE_PROVIDER_SITE_OTHER): Payer: Commercial Managed Care - HMO | Admitting: Internal Medicine

## 2016-08-28 VITALS — BP 120/68 | HR 80 | Wt 267.0 lb

## 2016-08-28 DIAGNOSIS — IMO0002 Reserved for concepts with insufficient information to code with codable children: Secondary | ICD-10-CM

## 2016-08-28 DIAGNOSIS — Z794 Long term (current) use of insulin: Secondary | ICD-10-CM

## 2016-08-28 DIAGNOSIS — E1159 Type 2 diabetes mellitus with other circulatory complications: Secondary | ICD-10-CM

## 2016-08-28 DIAGNOSIS — E1165 Type 2 diabetes mellitus with hyperglycemia: Secondary | ICD-10-CM | POA: Diagnosis not present

## 2016-08-28 LAB — POCT GLYCOSYLATED HEMOGLOBIN (HGB A1C): HEMOGLOBIN A1C: 6.5

## 2016-08-28 MED ORDER — INSULIN NPH (HUMAN) (ISOPHANE) 100 UNIT/ML ~~LOC~~ SUSP: 45.0000 [IU] | Freq: Two times a day (BID) | SUBCUTANEOUS | Status: DC
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
## 2016-08-28 NOTE — Patient Instructions (Addendum)
  Patient Instructions  Please continue ReliOn insulin doses:  Insulin Before breakfast Before lunch Before dinner  Regular 30 30 30   NPH 45  45   Please check some sugars at bedtime, also.  Keep up the good job!  Please come back for a follow-up appointment in 3 months.

## 2016-09-14 ENCOUNTER — Encounter: Payer: Self-pay | Admitting: Family Medicine

## 2016-09-14 ENCOUNTER — Ambulatory Visit (INDEPENDENT_AMBULATORY_CARE_PROVIDER_SITE_OTHER): Payer: Commercial Managed Care - HMO | Admitting: Family Medicine

## 2016-09-14 VITALS — BP 118/68 | HR 76 | Temp 98.3°F | Wt 268.2 lb

## 2016-09-14 DIAGNOSIS — N289 Disorder of kidney and ureter, unspecified: Secondary | ICD-10-CM

## 2016-09-14 DIAGNOSIS — R0602 Shortness of breath: Secondary | ICD-10-CM

## 2016-09-14 DIAGNOSIS — I25709 Atherosclerosis of coronary artery bypass graft(s), unspecified, with unspecified angina pectoris: Secondary | ICD-10-CM

## 2016-09-14 DIAGNOSIS — I10 Essential (primary) hypertension: Secondary | ICD-10-CM | POA: Diagnosis not present

## 2016-09-14 DIAGNOSIS — J0100 Acute maxillary sinusitis, unspecified: Secondary | ICD-10-CM

## 2016-09-14 MED ORDER — CEFDINIR 300 MG PO CAPS: 300.0000 mg | ORAL_CAPSULE | Freq: Two times a day (BID) | ORAL | 0 refills | Status: DC

## 2016-09-14 NOTE — Progress Notes (Signed)
Patient ID: William Hendrix, male   DOB: 1941-09-25, 75 y.o.   MRN: [id number]   Subjective:    Patient ID: William Hendrix, male    DOB: Jun 12, 1941, 75 y.o.   MRN: [id number]  Chief Complaint  Patient presents with  . 4-week follow up.    HPI Patient is in today for follow up. Has been struggling with persistent cough and congestion. Was hospitalized twice in October but not since. Still has headache, sinus pressure, cough, malaise. Notes fatigue and malaise. Denies CP/palp/fevers orGU c/o. Taking meds as prescribed  Past Medical History:  Diagnosis Date  . AAA (abdominal aortic aneurysm) ([place]) 12/15/2014   a. Mild aneurysmal dilatation of the infrarenal abdominal aorta 3.2 cm - f/u due by 2019.  . Arthritis    "all over" (07/30/2016)  . CAD (coronary artery disease)    a. stent to LAD 2000. b. possible spasm by cath 2001. c. IVUS/PTCA/DES to mLAD 05/2013. d. PTCA/DES of dRCA into ostial rPDA 07/2013. e. PTCA of OM2, DES to Cx, DES to [facility] 07/2016.  Marland Kitchen Chronic bronchitis ([place])   . Chronic diastolic CHF (congestive heart failure) ([place])   . Chronic lower back pain   . Chronic neck pain   . CKD (chronic kidney disease), stage III   . Diabetic peripheral neuropathy ([place])   . Ejection fraction    55%, 07/2010, mild inferior hypo  . GERD (gastroesophageal reflux disease)   . Gout   . H/O diverticulitis of colon 01/31/2015  . H/O hiatal hernia   . History of blood transfusion ~ 10/1940   "had pneumonia"  . HTN (hypertension)   . Hyperlipidemia   . Melanoma of forearm, left ([place]) 02/2011   with wide excision   . Nephrolithiasis   . Obesity   . OSA on CPAP     [name] since 2000  . Positive D-dimer    a. significant elevation ,hospital 07/2010, etiology unclear. b. D Dimer chronically > 20.  . Skin cancer   . Spinal stenosis    a. s/p surgical repair 2013  . Type II diabetes mellitus ([place])   . Ventral hernia     Past Surgical History:  Procedure Laterality Date  . BACK SURGERY     . CARDIAC CATHETERIZATION N/A 07/30/2016   Procedure: Left Heart Cath and Coronary Angiography;  Surgeon: Nelva Bush, MD;  Location: [place] CV LAB;  Service: Cardiovascular;  Laterality: N/A;  . CARDIAC CATHETERIZATION N/A 07/30/2016   Procedure: Coronary Stent Intervention;  Surgeon: Nelva Bush, MD;  Location: [place] CV LAB;  Service: Cardiovascular;  Laterality: N/A;  Mid CFX and MID RCA  . CARDIAC CATHETERIZATION N/A 07/30/2016   Procedure: Coronary Balloon Angioplasty;  Surgeon: Nelva Bush, MD;  Location: [place] CV LAB;  Service: Cardiovascular;  Laterality: N/A;  OM 1  . CARPAL TUNNEL RELEASE Left   . CATARACT EXTRACTION W/ INTRAOCULAR LENS  IMPLANT, BILATERAL Bilateral ~ 2014  . CERVICAL DISC SURGERY  1990s  . CORONARY ANGIOPLASTY WITH STENT PLACEMENT  05/2013  . CORONARY ANGIOPLASTY WITH STENT PLACEMENT  07/26/2013   DES to RCA extending to PDA    . CORONARY ANGIOPLASTY WITH STENT PLACEMENT  07/30/2016  . CORONARY ANGIOPLASTY WITH STENT PLACEMENT  2000   CAD  . DENTAL SURGERY  04/2016   "got infected; had to dig it out"  . KNEE ARTHROSCOPY Right 1990's   rt  . LEFT AND RIGHT HEART CATHETERIZATION WITH CORONARY ANGIOGRAM N/A 07/26/2013  Procedure: LEFT AND RIGHT HEART CATHETERIZATION WITH CORONARY ANGIOGRAM;  Surgeon: [name], MD;  Location: [place] CATH LAB;  Service: Cardiovascular;  Laterality: N/A;  . LEFT HEART CATHETERIZATION WITH CORONARY ANGIOGRAM N/A 05/19/2013   Procedure: LEFT HEART CATHETERIZATION WITH CORONARY ANGIOGRAM;  Surgeon: [name], MD;  Location: [facility] C[facility] LAB;  Service: Cardiovascular;  Laterality: N/A;  . LUMBAR LAMINECTOMY/DECOMPRESSION MICRODISCECTOMY  08/30/2012   Procedure: LUMBAR LAMINECTOMY/DECOMPRESSION MICRODISCECTOMY 2 LEVELS;  Surgeon: [name], MD;  Location: [place] NEURO ORS;  Service: Neurosurgery;  Laterality: Bilateral;  Bilateral Lumbar three-four Lumbar four-five Laminotomies  . LUMBAR SPINE SURGERY   04/07/2016   Dr. Ellene Route; "?ruptured disc"  . MELANOMA EXCISION Left 02/2011   forearm  . NASAL SINUS SURGERY  1970s   "cut windows in sinus pockets"  . PERCUTANEOUS CORONARY STENT INTERVENTION (PCI-S)  05/19/2013   Procedure: PERCUTANEOUS CORONARY STENT INTERVENTION (PCI-S);  Surgeon: [name], MD;  Location: [facility] CATH LAB;  Service: Cardiovascular;;  . ULNAR TUNNEL RELEASE Left 04/07/2016   Dr. Ellene Route    Family History  Problem Relation Age of Onset  . Cancer Mother     intestinal   . Heart disease Mother   . Cancer Father     prostate  . Migraines Daughter   . Leukemia Sister   . Heart disease Sister   . Prostate cancer    . Kidney cancer    . Cancer      Bladder cancer  . Coronary artery disease    . Hypertension Sister   . Hypertension Brother   . Heart attack Neg Hx   . Stroke Neg Hx     Social History   Social History  . Marital status: Married    Spouse name: N/A  . Number of children: N/A  . Years of education: N/A   Occupational History  . [employer]    Social History Main Topics  . Smoking status: Former Smoker    Packs/day: 3.00    Years: 30.00    Types: Cigarettes    Quit date: 09/17/1996  . Smokeless tobacco: Never Used  . Alcohol use No  . Drug use: No  . Sexual activity: Not Currently    Birth control/ protection: None   Other Topics Concern  . Not on file   Social History Narrative   Married and lives locally with his wife.  [employer].    Outpatient Medications Prior to Visit  Medication Sig Dispense Refill  . acetaminophen (TYLENOL) 500 MG tablet Take 1,000 mg by mouth 2 (two) times daily.    Marland Kitchen albuterol (PROVENTIL HFA;VENTOLIN HFA) 108 (90 BASE) MCG/ACT inhaler Inhale 2 puffs into the lungs every 6 (six) hours as needed for wheezing or shortness of breath. 18 Inhaler 2  . allopurinol (ZYLOPRIM) 300 MG tablet TAKE 1 TABLET EVERY DAY 90 tablet 1  . aspirin EC 81 MG tablet Take 81 mg by mouth daily.    Marland Kitchen atorvastatin (LIPITOR) 40 MG  tablet Take 1 tablet (40 mg total) by mouth daily. 90 tablet 0  . B Complex Vitamins (B COMPLEX-B12 PO) Take 1 tablet by mouth at bedtime.    . Cholecalciferol (VITAMIN D) 1000 UNITS capsule Take 1,000 Units by mouth at bedtime.     . cyclobenzaprine (FLEXERIL) 5 MG tablet Take 1 tablet (5 mg total) by mouth 2 (two) times daily as needed for muscle spasms. 60 tablet 2  . fenofibrate 160 MG tablet TAKE 1 TABLET EVERY DAY (Patient taking differently:  TAKE 1 TABLET EVERY night) 90 tablet 1  . fluocinonide cream (LIDEX) [id number] % Apply 1 application topically daily as needed. FOR RASH    . fluticasone (FLONASE) 50 MCG/ACT nasal spray Place 2 sprays into both nostrils daily. (Patient taking differently: Place 2 sprays into both nostrils as needed. ) 16 g 1  . fluticasone (FLONASE) 50 MCG/ACT nasal spray Place 2 sprays into both nostrils daily. 16 g 1  . furosemide (LASIX) 80 MG tablet Take 0.5 tablets (40 mg total) by mouth daily. 45 tablet 1  . HYDROcodone-homatropine (HYCODAN) 5-1.5 MG/5ML syrup Take 5 mLs by mouth every 8 (eight) hours as needed for cough. 120 mL 0  . insulin NPH Human (NOVOLIN N) 100 UNIT/ML injection Inject 0.45 mLs (45 Units total) into the skin 2 (two) times daily before a meal.    . insulin regular (NOVOLIN R RELION) 100 units/mL injection Inject 0.25-0.3 mLs (25-30 Units total) into the skin 3 (three) times daily before meals. (Patient taking differently: Inject 30 Units into the skin 3 (three) times daily before meals. ) 70 mL 1  . isosorbide mononitrate (IMDUR) 30 MG 24 hr tablet TAKE 1 TABLET EVERY DAY 90 tablet 3  . loratadine (CLARITIN) 10 MG tablet Take 1 tablet (10 mg total) by mouth daily. 30 tablet 0  . metoprolol (LOPRESSOR) 50 MG tablet TAKE 1 TABLET TWICE DAILY 180 tablet 1  . nitroGLYCERIN (NITROSTAT) 0.4 MG SL tablet Place 1 tablet (0.4 mg total) under the tongue every 5 (five) minutes as needed for chest pain. 25 tablet 3  . pantoprazole (PROTONIX) 20 MG tablet Take 1  tablet (20 mg total) by mouth daily. 90 tablet 3  . prasugrel (EFFIENT) 10 MG TABS tablet Take 1 tablet (10 mg total) by mouth daily. 30 tablet 11  . tamsulosin (FLOMAX) 0.4 MG CAPS capsule TAKE 1 CAPSULE EVERY DAY 90 capsule 1  . doxycycline (VIBRA-TABS) 100 MG tablet Take 1 tablet (100 mg total) by mouth 2 (two) times daily. Can give caps or generic. 20 tablet 0   No facility-administered medications prior to visit.     Allergies  Allergen Reactions  . Levemir [Insulin Detemir] Swelling    Patient had redness and swelling and tenderness at injection site.  . Codeine Other (See Comments)    Makes him "shakey", is OK with hydrocodone GI UPSET & TREMORS  . Lipitor [Atorvastatin] Other (See Comments)    Muscle aches; liver functions. Patient is currently taken  . Novolog Mix [Insulin Aspart Prot & Aspart]     Causes skin to swell/itch at injection site  . Other     Brilinta caused SOB    Review of Systems  Constitutional: Positive for malaise/fatigue. Negative for fever.  HENT: Positive for congestion, ear pain, nosebleeds and sore throat. Negative for hearing loss.   Eyes: Negative for blurred vision.  Respiratory: Positive for cough, sputum production and shortness of breath. Negative for wheezing.   Cardiovascular: Negative for chest pain, palpitations and leg swelling.  Gastrointestinal: Negative for abdominal pain, blood in stool and nausea.  Genitourinary: Negative for dysuria and frequency.  Musculoskeletal: Positive for joint pain. Negative for falls.  Skin: Negative for rash.  Neurological: Negative for dizziness, loss of consciousness and headaches.  Endo/Heme/Allergies: Negative for environmental allergies.  Psychiatric/Behavioral: Negative for depression. The patient is not nervous/anxious.        Objective:    Physical Exam  Constitutional: He is oriented to person, place, and time. He appears well-developed  and well-nourished. No distress.  HENT:  Head:  Normocephalic and atraumatic.  Nose: Nose normal.  Left TM and oropharynx erythematous  Eyes: Right eye exhibits no discharge. Left eye exhibits no discharge.  Neck: Normal range of motion. Neck supple.  Cardiovascular: Normal rate and regular rhythm.   No murmur heard. Pulmonary/Chest: Effort normal and breath sounds normal.  Abdominal: Soft. Bowel sounds are normal. There is no tenderness.  Musculoskeletal: He exhibits no edema.  Lymphadenopathy:    He has cervical adenopathy.  Neurological: He is alert and oriented to person, place, and time.  Skin: Skin is warm and dry.  Psychiatric: He has a normal mood and affect.  Nursing note and vitals reviewed.   BP 118/68 (BP Location: Left Arm, Patient Position: Sitting, Cuff Size: Large)   Pulse 76   Temp 98.3 F (36.8 C) (Oral)   Wt 268 lb 3.2 oz (121.7 kg)   SpO2 96%   BMI 37.94 kg/m  Wt Readings from Last 3 Encounters:  09/14/16 268 lb 3.2 oz (121.7 kg)  08/28/16 267 lb (121.1 kg)  08/17/16 263 lb 8 oz (119.5 kg)     Lab Results  Component Value Date   WBC 8.2 08/05/2016   HGB 14.4 08/05/2016   HCT 41.6 08/05/2016   PLT 244.0 08/05/2016   GLUCOSE 132 (H) 08/05/2016   CHOL 133 05/15/2016   TRIG 214.0 (H) 05/15/2016   HDL 26.80 (L) 05/15/2016   LDLDIRECT 73.0 05/15/2016   LDLCALC 31 04/09/2014   ALT 16 08/05/2016   AST 16 08/05/2016   NA 138 08/05/2016   K 4.0 08/05/2016   CL 101 08/05/2016   CREATININE 1.47 (H) 08/05/2016   BUN 28 (H) 08/05/2016   CO2 26 08/05/2016   TSH 3.19 05/15/2016   INR 1.0 07/25/2013   HGBA1C 6.5 08/28/2016   MICROALBUR 0.7 08/02/2014    Lab Results  Component Value Date   TSH 3.19 05/15/2016   Lab Results  Component Value Date   WBC 8.2 08/05/2016   HGB 14.4 08/05/2016   HCT 41.6 08/05/2016   MCV 91.5 08/05/2016   PLT 244.0 08/05/2016   Lab Results  Component Value Date   NA 138 08/05/2016   K 4.0 08/05/2016   CO2 26 08/05/2016   GLUCOSE 132 (H) 08/05/2016   BUN 28  (H) 08/05/2016   CREATININE 1.47 (H) 08/05/2016   BILITOT 0.6 08/05/2016   ALKPHOS 41 08/05/2016   AST 16 08/05/2016   ALT 16 08/05/2016   PROT 7.0 08/05/2016   ALBUMIN 3.9 08/05/2016   CALCIUM 9.9 08/05/2016   ANIONGAP 10 08/02/2016   GFR 48.71 (L) 05/15/2016   Lab Results  Component Value Date   CHOL 133 05/15/2016   Lab Results  Component Value Date   HDL 26.80 (L) 05/15/2016   Lab Results  Component Value Date   LDLCALC 31 04/09/2014   Lab Results  Component Value Date   TRIG 214.0 (H) 05/15/2016   Lab Results  Component Value Date   CHOLHDL 5 05/15/2016   Lab Results  Component Value Date   HGBA1C 6.5 08/28/2016       Assessment & Plan:   Problem List Items Addressed This Visit    Renal insufficiency    Repeat CMP today.      CAD (coronary artery disease)   Relevant Orders   Ambulatory referral to Physical Therapy   SOB (shortness of breath) - Primary    Persistent s/p his cardiac stenting and frustrated.  Is referred for Physical therapy to help him get moving again.       Relevant Orders   Ambulatory referral to Physical Therapy   Sinusitis    Start Cefdinir, Mucinex and report if symptoms worsen      Relevant Medications   cefdinir (OMNICEF) 300 MG capsule    Other Visit Diagnoses    Hypertension, unspecified type       Relevant Orders   Comprehensive metabolic panel   CBC   TSH      I have discontinued Mr. Trembly doxycycline. I am also having him start on cefdinir. Additionally, I am having him maintain his Vitamin D, fluocinonide cream, aspirin EC, acetaminophen, nitroGLYCERIN, fluticasone, albuterol, isosorbide mononitrate, insulin regular, fenofibrate, metoprolol, tamsulosin, atorvastatin, pantoprazole, B Complex Vitamins (B COMPLEX-B12 PO), furosemide, loratadine, prasugrel, fluticasone, HYDROcodone-homatropine, allopurinol, cyclobenzaprine, and insulin NPH Human.  Meds ordered this encounter  Medications  . cefdinir (OMNICEF)  300 MG capsule    Sig: Take 1 capsule (300 mg total) by mouth 2 (two) times daily.    Dispense:  20 capsule    Refill:  0     Penni Homans, MD

## 2016-09-14 NOTE — Assessment & Plan Note (Signed)
Persistent s/p his cardiac stenting and frustrated. Is referred for Physical therapy to help him get moving again.

## 2016-09-14 NOTE — Patient Instructions (Signed)

## 2016-09-14 NOTE — Progress Notes (Signed)
HPI: FU CAD. Pt has had previous PCI of his LAD. Lower extremity Dopplers January 2015 normal. Abdominal ultrasound March 2017 showed 3.2 cm abdominal aortic aneurysm. Patient had admission October 2017 with NSTEMI. Echocardiogram October 2017 showed ejection fraction 45-50%, mild right ventricular enlargement. Venous Dopplers October 2017 showed no DVT. Cardiac catheterization October 2017 showed 80% mid circumflex and 50% ulcerated mid RCA lesion. The LAD and distal RCA stents were patent. He had PCI of the circumflex and RCA with drug-eluting stents. Readmitted with dyspnea. D-dimer elevated but VQ scan negative for pulmonary embolus. Since last seen, patient has fatigue but denies dyspnea, chest pain or syncope.  Current Outpatient Prescriptions  Medication Sig Dispense Refill  . acetaminophen (TYLENOL) 500 MG tablet Take 1,000 mg by mouth 2 (two) times daily.    Marland Kitchen allopurinol (ZYLOPRIM) 300 MG tablet TAKE 1 TABLET EVERY DAY 90 tablet 1  . aspirin EC 81 MG tablet Take 81 mg by mouth daily.    Marland Kitchen atorvastatin (LIPITOR) 40 MG tablet Take 1 tablet (40 mg total) by mouth daily. 90 tablet 0  . B Complex Vitamins (B COMPLEX-B12 PO) Take 1 tablet by mouth at bedtime.    . Cholecalciferol (VITAMIN D) 1000 UNITS capsule Take 1,000 Units by mouth at bedtime.     . cyclobenzaprine (FLEXERIL) 5 MG tablet Take 1 tablet (5 mg total) by mouth 2 (two) times daily as needed for muscle spasms. 60 tablet 2  . fenofibrate 160 MG tablet TAKE 1 TABLET EVERY DAY (Patient taking differently: TAKE 1 TABLET EVERY night) 90 tablet 1  . fluocinonide cream (LIDEX) [id number] % Apply 1 application topically daily as needed. FOR RASH    . fluticasone (FLONASE) 50 MCG/ACT nasal spray Place 2 sprays into both nostrils daily. (Patient taking differently: Place 2 sprays into both nostrils as needed. ) 16 g 1  . fluticasone (FLONASE) 50 MCG/ACT nasal spray Place 2 sprays into both nostrils daily. 16 g 1  . furosemide (LASIX) 80  MG tablet Take 0.5 tablets (40 mg total) by mouth daily. 45 tablet 1  . HYDROcodone-homatropine (HYCODAN) 5-1.5 MG/5ML syrup Take 5 mLs by mouth every 8 (eight) hours as needed for cough. 120 mL 0  . insulin NPH Human (NOVOLIN N) 100 UNIT/ML injection Inject 0.45 mLs (45 Units total) into the skin 2 (two) times daily before a meal.    . insulin regular (NOVOLIN R RELION) 100 units/mL injection Inject 0.25-0.3 mLs (25-30 Units total) into the skin 3 (three) times daily before meals. (Patient taking differently: Inject 30 Units into the skin 3 (three) times daily before meals. ) 70 mL 1  . isosorbide mononitrate (IMDUR) 30 MG 24 hr tablet TAKE 1 TABLET EVERY DAY 90 tablet 3  . loratadine (CLARITIN) 10 MG tablet Take 1 tablet (10 mg total) by mouth daily. 30 tablet 0  . metoprolol (LOPRESSOR) 50 MG tablet TAKE 1 TABLET TWICE DAILY 180 tablet 1  . nitroGLYCERIN (NITROSTAT) 0.4 MG SL tablet Place 1 tablet (0.4 mg total) under the tongue every 5 (five) minutes as needed for chest pain. 25 tablet 3  . pantoprazole (PROTONIX) 20 MG tablet Take 1 tablet (20 mg total) by mouth daily. 90 tablet 3  . prasugrel (EFFIENT) 10 MG TABS tablet Take 1 tablet (10 mg total) by mouth daily. 30 tablet 11  . tamsulosin (FLOMAX) 0.4 MG CAPS capsule TAKE 1 CAPSULE EVERY DAY 90 capsule 1   No current facility-administered medications for this visit.  Past Medical History:  Diagnosis Date  . AAA (abdominal aortic aneurysm) ([place]) 12/15/2014   a. Mild aneurysmal dilatation of the infrarenal abdominal aorta 3.2 cm - f/u due by 2019.  . Arthritis    "all over" (07/30/2016)  . CAD (coronary artery disease)    a. stent to LAD 2000. b. possible spasm by cath 2001. c. IVUS/PTCA/DES to mLAD 05/2013. d. PTCA/DES of dRCA into ostial rPDA 07/2013. e. PTCA of OM2, DES to Cx, DES to [facility] 07/2016.  Marland Kitchen Chronic bronchitis ([place])   . Chronic diastolic CHF (congestive heart failure) ([place])   . Chronic lower back pain   . Chronic neck  pain   . CKD (chronic kidney disease), stage III   . Diabetic peripheral neuropathy ([place])   . Ejection fraction    55%, 07/2010, mild inferior hypo  . GERD (gastroesophageal reflux disease)   . Gout   . H/O diverticulitis of colon 01/31/2015  . H/O hiatal hernia   . History of blood transfusion ~ 10/1940   "had pneumonia"  . HTN (hypertension)   . Hyperlipidemia   . Melanoma of forearm, left ([place]) 02/2011   with wide excision   . Nephrolithiasis   . Obesity   . OSA on CPAP     [name] since 2000  . Positive D-dimer    a. significant elevation ,hospital 07/2010, etiology unclear. b. D Dimer chronically > 20.  . Skin cancer   . Spinal stenosis    a. s/p surgical repair 2013  . Type II diabetes mellitus ([place])   . Ventral hernia     Past Surgical History:  Procedure Laterality Date  . BACK SURGERY    . CARDIAC CATHETERIZATION N/A 07/30/2016   Procedure: Left Heart Cath and Coronary Angiography;  Surgeon: Nelva Bush, MD;  Location: [place] CV LAB;  Service: Cardiovascular;  Laterality: N/A;  . CARDIAC CATHETERIZATION N/A 07/30/2016   Procedure: Coronary Stent Intervention;  Surgeon: Nelva Bush, MD;  Location: [place] CV LAB;  Service: Cardiovascular;  Laterality: N/A;  Mid CFX and MID RCA  . CARDIAC CATHETERIZATION N/A 07/30/2016   Procedure: Coronary Balloon Angioplasty;  Surgeon: Nelva Bush, MD;  Location: [place] CV LAB;  Service: Cardiovascular;  Laterality: N/A;  OM 1  . CARPAL TUNNEL RELEASE Left   . CATARACT EXTRACTION W/ INTRAOCULAR LENS  IMPLANT, BILATERAL Bilateral ~ 2014  . CERVICAL DISC SURGERY  1990s  . CORONARY ANGIOPLASTY WITH STENT PLACEMENT  05/2013  . CORONARY ANGIOPLASTY WITH STENT PLACEMENT  07/26/2013   DES to RCA extending to PDA    . CORONARY ANGIOPLASTY WITH STENT PLACEMENT  07/30/2016  . CORONARY ANGIOPLASTY WITH STENT PLACEMENT  2000   CAD  . DENTAL SURGERY  04/2016   "got infected; had to dig it out"  . KNEE ARTHROSCOPY  Right 1990's   rt  . LEFT AND RIGHT HEART CATHETERIZATION WITH CORONARY ANGIOGRAM N/A 07/26/2013   Procedure: LEFT AND RIGHT HEART CATHETERIZATION WITH CORONARY ANGIOGRAM;  Surgeon: [name], MD;  Location: [place] CATH LAB;  Service: Cardiovascular;  Laterality: N/A;  . LEFT HEART CATHETERIZATION WITH CORONARY ANGIOGRAM N/A 05/19/2013   Procedure: LEFT HEART CATHETERIZATION WITH CORONARY ANGIOGRAM;  Surgeon: [name], MD;  Location: [facility] CATH LAB;  Service: Cardiovascular;  Laterality: N/A;  . LUMBAR LAMINECTOMY/DECOMPRESSION MICRODISCECTOMY  08/30/2012   Procedure: LUMBAR LAMINECTOMY/DECOMPRESSION MICRODISCECTOMY 2 LEVELS;  Surgeon: [name], MD;  Location: [place] NEURO ORS;  Service: Neurosurgery;  Laterality: Bilateral;  Bilateral Lumbar three-four  Lumbar four-five Laminotomies  . LUMBAR SPINE SURGERY  04/07/2016   Dr. Ellene Route; "?ruptured disc"  . MELANOMA EXCISION Left 02/2011   forearm  . NASAL SINUS SURGERY  1970s   "cut windows in sinus pockets"  . PERCUTANEOUS CORONARY STENT INTERVENTION (PCI-S)  05/19/2013   Procedure: PERCUTANEOUS CORONARY STENT INTERVENTION (PCI-S);  Surgeon: [name], MD;  Location: [facility] CATH LAB;  Service: Cardiovascular;;  . ULNAR TUNNEL RELEASE Left 04/07/2016   Dr. Ellene Route    Social History   Social History  . Marital status: Married    Spouse name: N/A  . Number of children: N/A  . Years of education: N/A   Occupational History  . [employer]    Social History Main Topics  . Smoking status: Former Smoker    Packs/day: 3.00    Years: 30.00    Types: Cigarettes    Quit date: 09/17/1996  . Smokeless tobacco: Never Used  . Alcohol use No  . Drug use: No  . Sexual activity: Not Currently    Birth control/ protection: None   Other Topics Concern  . Not on file   Social History Narrative   Married and lives locally with his wife.  [employer].    Family History  Problem Relation Age of Onset  . Cancer Mother     intestinal   . Heart  disease Mother   . Cancer Father     prostate  . Migraines Daughter   . Leukemia Sister   . Heart disease Sister   . Prostate cancer    . Kidney cancer    . Cancer      Bladder cancer  . Coronary artery disease    . Hypertension Sister   . Hypertension Brother   . Heart attack Neg Hx   . Stroke Neg Hx     ROS: Fatigued but no fevers or chills, productive cough, hemoptysis, dysphasia, odynophagia, melena, hematochezia, dysuria, hematuria, rash, seizure activity, orthopnea, PND, pedal edema, claudication. Remaining systems are negative.  Physical Exam: Well-developed well-nourished in no acute distress.  Skin is warm and dry.  HEENT is normal.  Neck is supple.  Chest is clear to auscultation with normal expansion.  Cardiovascular exam is regular rate and rhythm.  Abdominal exam nontender or distended. No masses palpated. Extremities show no edema. neuro grossly intact  A/P  1 coronary artery disease-continue aspirin. Patient is greater than 75 and I will discontinue effient and instead treat with Plavix 75 mg daily. He had severe dyspnea with brilinta. Continue statin.  2 Abdominal aortic aneurysm-follow-up abdominal ultrasound March 2018.   3 hypertension-blood pressure controlled. Continue present medications.  4 hyperlipidemia-continue statin.   Kirk Ruths, MD

## 2016-09-14 NOTE — Progress Notes (Signed)
Pre visit review using our clinic review tool, if applicable. No additional management support is needed unless otherwise documented below in the visit note. 

## 2016-09-14 NOTE — Assessment & Plan Note (Signed)
Start Cefdinir, Mucinex and report if symptoms worsen

## 2016-09-14 NOTE — Assessment & Plan Note (Signed)
Repeat CMP today 

## 2016-09-15 LAB — CBC
HCT: 40.3 % (ref 39.0–52.0)
HEMOGLOBIN: 13.7 g/dL (ref 13.0–17.0)
MCHC: 34 g/dL (ref 30.0–36.0)
MCV: 93.8 fl (ref 78.0–100.0)
Platelets: 205 10*3/uL (ref 150.0–400.0)
RBC: 4.3 Mil/uL (ref 4.22–5.81)
RDW: 15.6 % — AB (ref 11.5–15.5)
WBC: 6.6 10*3/uL (ref 4.0–10.5)

## 2016-09-15 LAB — COMPREHENSIVE METABOLIC PANEL
ALBUMIN: 4.2 g/dL (ref 3.5–5.2)
ALK PHOS: 53 U/L (ref 39–117)
ALT: 24 U/L (ref 0–53)
AST: 19 U/L (ref 0–37)
BUN: 22 mg/dL (ref 6–23)
CALCIUM: 10.2 mg/dL (ref 8.4–10.5)
CHLORIDE: 102 meq/L (ref 96–112)
CO2: 29 mEq/L (ref 19–32)
CREATININE: 1.31 mg/dL (ref 0.40–1.50)
GFR: 56.47 mL/min — ABNORMAL LOW (ref >60.00)
Glucose, Bld: 355 mg/dL — ABNORMAL HIGH (ref 70–99)
POTASSIUM: 4 meq/L (ref 3.5–5.1)
SODIUM: 139 meq/L (ref 135–145)
TOTAL PROTEIN: 7.2 g/dL (ref 6.0–8.3)
Total Bilirubin: 0.6 mg/dL (ref 0.2–1.2)

## 2016-09-15 LAB — TSH: TSH: 2.75 u[IU]/mL (ref 0.35–4.50)

## 2016-09-16 ENCOUNTER — Ambulatory Visit (INDEPENDENT_AMBULATORY_CARE_PROVIDER_SITE_OTHER): Payer: Commercial Managed Care - HMO | Admitting: Cardiology

## 2016-09-16 ENCOUNTER — Encounter: Payer: Self-pay | Admitting: Cardiology

## 2016-09-16 VITALS — BP 121/70 | HR 77 | Ht 70.5 in | Wt 259.1 lb

## 2016-09-16 DIAGNOSIS — I714 Abdominal aortic aneurysm, without rupture, unspecified: Secondary | ICD-10-CM

## 2016-09-16 DIAGNOSIS — M5416 Radiculopathy, lumbar region: Secondary | ICD-10-CM | POA: Diagnosis not present

## 2016-09-16 DIAGNOSIS — I2510 Atherosclerotic heart disease of native coronary artery without angina pectoris: Secondary | ICD-10-CM

## 2016-09-16 MED ORDER — CLOPIDOGREL BISULFATE 75 MG PO TABS: 75.0000 mg | ORAL_TABLET | Freq: Every day | ORAL | 3 refills | Status: DC

## 2016-09-16 NOTE — Patient Instructions (Signed)
Medication Instructions:   STOP EFFIENT  START CLOPIDOGREL 75 MG ONCE DAILY   Follow-Up:  Your physician wants you to follow-up in: [place] will receive a reminder letter in the mail two months in advance. If you don't receive a letter, please call our office to schedule the follow-up appointment.   If you need a refill on your cardiac medications before your next appointment, please call your pharmacy.

## 2016-10-09 ENCOUNTER — Telehealth: Payer: Self-pay | Admitting: Cardiology

## 2016-10-09 DIAGNOSIS — I2510 Atherosclerotic heart disease of native coronary artery without angina pectoris: Secondary | ICD-10-CM

## 2016-10-09 NOTE — Telephone Encounter (Signed)
Ok for cardiac rehab William Hendrix

## 2016-10-09 NOTE — Telephone Encounter (Signed)
Pt's wife calling regarding status of Cardiac Rehab -pls call [phone]

## 2016-10-09 NOTE — Telephone Encounter (Signed)
Spoke to patient's wife .  She was calling to follow up if patient needs cardiac rehab , she states something was mentioned at last office visit.  She states if needed , they were prefere something closest to [place] area. Aware will defer Dr Stanford Breed and then placed order if needed. Verbalized understanding.

## 2016-10-13 NOTE — Telephone Encounter (Signed)
Spoke with pt wife, aware order for rehab has been placed and she will let me know if they do not hear from rehab.

## 2016-10-15 ENCOUNTER — Telehealth (HOSPITAL_COMMUNITY): Payer: Self-pay | Admitting: *Deleted

## 2016-10-15 ENCOUNTER — Ambulatory Visit (INDEPENDENT_AMBULATORY_CARE_PROVIDER_SITE_OTHER): Payer: Medicare HMO | Admitting: Medical

## 2016-10-15 ENCOUNTER — Encounter: Payer: Self-pay | Admitting: Medical

## 2016-10-15 VITALS — BP 117/64 | HR 76 | Temp 97.5°F | Ht 70.5 in | Wt 263.6 lb

## 2016-10-15 DIAGNOSIS — R197 Diarrhea, unspecified: Secondary | ICD-10-CM

## 2016-10-15 DIAGNOSIS — R112 Nausea with vomiting, unspecified: Secondary | ICD-10-CM | POA: Diagnosis not present

## 2016-10-15 LAB — COMPREHENSIVE METABOLIC PANEL
ALBUMIN: 4.6 g/dL (ref 3.5–5.2)
ALT: 28 U/L (ref 0–53)
AST: 24 U/L (ref 0–37)
Alkaline Phosphatase: 49 U/L (ref 39–117)
BUN: 27 mg/dL — ABNORMAL HIGH (ref 6–23)
CALCIUM: 10.1 mg/dL (ref 8.4–10.5)
CHLORIDE: 105 meq/L (ref 96–112)
CO2: 26 meq/L (ref 19–32)
Creatinine, Ser: 1.51 mg/dL — ABNORMAL HIGH (ref 0.40–1.50)
GFR: 47.92 mL/min — ABNORMAL LOW (ref >60.00)
Glucose, Bld: 216 mg/dL — ABNORMAL HIGH (ref 70–99)
POTASSIUM: 4.2 meq/L (ref 3.5–5.1)
SODIUM: 138 meq/L (ref 135–145)
Total Bilirubin: 0.8 mg/dL (ref 0.2–1.2)
Total Protein: 7.9 g/dL (ref 6.0–8.3)

## 2016-10-15 LAB — CBC WITH DIFFERENTIAL/PLATELET
BASOS PCT: 0.4 % (ref 0.0–3.0)
Basophils Absolute: 0 10*3/uL (ref 0.0–0.1)
EOS ABS: 0.2 10*3/uL (ref 0.0–0.7)
EOS PCT: 2.1 % (ref 0.0–5.0)
HCT: 45.5 % (ref 39.0–52.0)
HEMOGLOBIN: 15.7 g/dL (ref 13.0–17.0)
Lymphocytes Relative: 31.3 % (ref 12.0–46.0)
Lymphs Abs: 3.4 10*3/uL (ref 0.7–4.0)
MCHC: 34.4 g/dL (ref 30.0–36.0)
MCV: 93.7 fl (ref 78.0–100.0)
MONO ABS: 0.7 10*3/uL (ref 0.1–1.0)
Monocytes Relative: 6.1 % (ref 3.0–12.0)
Neutro Abs: 6.6 10*3/uL (ref 1.4–7.7)
Neutrophils Relative %: 60.1 % (ref 43.0–77.0)
PLATELETS: 239 10*3/uL (ref 150.0–400.0)
RBC: 4.86 Mil/uL (ref 4.22–5.81)
RDW: 14.5 % (ref 11.5–15.5)
WBC: 10.9 10*3/uL — AB (ref 4.0–10.5)

## 2016-10-15 MED ORDER — DIPHENOXYLATE-ATROPINE 2.5-0.025 MG PO TABS: ORAL_TABLET | ORAL | 0 refills | Status: DC

## 2016-10-15 MED ORDER — ONDANSETRON 8 MG PO TBDP: 8.0000 mg | ORAL_TABLET | Freq: Three times a day (TID) | ORAL | 0 refills | Status: DC | PRN

## 2016-10-15 MED FILL — DIPHENOXYLATE/ATROPINE TAB: 2.5-0.025 | 4 days supply | Qty: 30 | Fill #0

## 2016-10-15 MED FILL — ONDANSETRON ODT 8 MG TABLET: 8 | 6 days supply | Qty: 20 | Fill #0

## 2016-10-15 NOTE — Telephone Encounter (Signed)
Pt returned call from message left.  Pt recently discharged from [facility] due to diarrhea.  Pt reported undergoing series of test to determine the cause.  Pt requested his insurance be checked for benefits and to be called back next week. Cherre Huger, BSN

## 2016-10-15 NOTE — Progress Notes (Signed)
 Subjective:    Patient ID: Marcelle R Procell, male    DOB: 11/08/1940, 76 y.o.   MRN: [id number]  HPI  Pt in with diarrhea for 3 days. Pt loose stools about every 2 hours. Mild nausea yesterday. Last 2 days just eating small soup amount of soup. Some dry heaving and nausea. No vomiting. Over last 2 weeks no recent antibiotics but early December cefdnir and he would have finished that antibiotic around 09-24-2016. No recent hospital visits. Pt feels little chills inside his house. Wife had very mild loose stools last week but she felt better quickly.  Pt has been waiting on getting cardiac rehab. Pt saw Dr .Crenshaw and he had agreed but that has not been set up yet. They area awaiting on call within a week.   Review of Systems  Constitutional: Positive for fatigue. Negative for chills.  Respiratory: Negative for cough, chest tightness, shortness of breath and wheezing.   Cardiovascular: Negative for chest pain and palpitations.  Gastrointestinal: Positive for diarrhea. Negative for abdominal pain.       No preceding constipation.  Skin: Negative for rash.  Neurological: Negative for dizziness, speech difficulty, weakness, numbness and headaches.  Hematological: Negative for adenopathy. Does not bruise/bleed easily.  Psychiatric/Behavioral: Negative for behavioral problems and confusion.   Past Medical History:  Diagnosis Date  . AAA (abdominal aortic aneurysm) (HCC) 12/15/2014   a. Mild aneurysmal dilatation of the infrarenal abdominal aorta 3.2 cm - f/u due by 2019.  . Arthritis    "all over" (07/30/2016)  . CAD (coronary artery disease)    a. stent to LAD 2000. b. possible spasm by cath 2001. c. IVUS/PTCA/DES to mLAD 05/2013. d. PTCA/DES of dRCA into ostial rPDA 07/2013. e. PTCA of OM2, DES to Cx, DES to mRCA 07/2016.  . Chronic bronchitis (HCC)   . Chronic diastolic CHF (congestive heart failure) (HCC)   . Chronic lower back pain   . Chronic neck pain   . CKD (chronic kidney  disease), stage III   . Diabetic peripheral neuropathy (HCC)   . Ejection fraction    55%, 07/2010, mild inferior hypo  . GERD (gastroesophageal reflux disease)   . Gout   . H/O diverticulitis of colon 01/31/2015  . H/O hiatal hernia   . History of blood transfusion ~ 10/1940   "had pneumonia"  . HTN (hypertension)   . Hyperlipidemia   . Melanoma of forearm, left (HCC) 02/2011   with wide excision   . Nephrolithiasis   . Obesity   . OSA on CPAP     Dr Sood since 2000  . Positive D-dimer    a. significant elevation ,hospital 07/2010, etiology unclear. b. D Dimer chronically > 20.  . Skin cancer   . Spinal stenosis    a. s/p surgical repair 2013  . Type II diabetes mellitus (HCC)   . Ventral hernia      Social History   Social History  . Marital status: Married    Spouse name: N/A  . Number of children: N/A  . Years of education: N/A   Occupational History  . Painter    Social History Main Topics  . Smoking status: Former Smoker    Packs/day: 3.00    Years: 30.00    Types: Cigarettes    Quit date: 09/17/1996  . Smokeless tobacco: Never Used  . Alcohol use No  . Drug use: No  . Sexual activity: Not Currently    Birth control/ protection: None     Other Topics Concern  . 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    Past Surgical History:  Procedure Laterality Date  . BACK SURGERY    . CARDIAC CATHETERIZATION N/A 07/30/2016   Procedure: Left Heart Cath and Coronary Angiography;  Surgeon: Christopher End, MD;  Location: [facility];  Service: Cardiovascular;  Laterality: N/A;  . CARDIAC CATHETERIZATION N/A 07/30/2016   Procedure: Coronary Stent Intervention;  Surgeon: Christopher End, MD;  Location: [facility];  Service: Cardiovascular;  Laterality: N/A;  Mid CFX and MID RCA  . CARDIAC CATHETERIZATION N/A 07/30/2016   Procedure: Coronary Balloon Angioplasty;  Surgeon: Christopher End, MD;  Location:  [facility];  Service: Cardiovascular;  Laterality: N/A;  OM 1  . CARPAL TUNNEL RELEASE Left   . CATARACT EXTRACTION W/ INTRAOCULAR LENS  IMPLANT, BILATERAL Bilateral ~ 2014  . CERVICAL DISC SURGERY  1990s  . CORONARY ANGIOPLASTY WITH STENT PLACEMENT  05/2013  . CORONARY ANGIOPLASTY WITH STENT PLACEMENT  07/26/2013   DES to RCA extending to PDA    . CORONARY ANGIOPLASTY WITH STENT PLACEMENT  07/30/2016  . CORONARY ANGIOPLASTY WITH STENT PLACEMENT  2000   CAD  . DENTAL SURGERY  04/2016   "got infected; had to dig it out"  . KNEE ARTHROSCOPY Right 1990's   rt  . LEFT AND RIGHT HEART CATHETERIZATION WITH CORONARY ANGIOGRAM N/A 07/26/2013   Procedure: LEFT AND RIGHT HEART CATHETERIZATION WITH CORONARY ANGIOGRAM;  Surgeon: Muhammad A Arida, MD;  Location: [facility];  Service: Cardiovascular;  Laterality: N/A;  . LEFT HEART CATHETERIZATION WITH CORONARY ANGIOGRAM N/A 05/19/2013   Procedure: LEFT HEART CATHETERIZATION WITH CORONARY ANGIOGRAM;  Surgeon: Dalton S [name], MD;  Location: [facility];  Service: Cardiovascular;  Laterality: N/A;  . LUMBAR LAMINECTOMY/DECOMPRESSION MICRODISCECTOMY  08/30/2012   Procedure: LUMBAR LAMINECTOMY/DECOMPRESSION MICRODISCECTOMY 2 LEVELS;  Surgeon: Henry Elsner, MD;  Location: [facility];  Service: Neurosurgery;  Laterality: Bilateral;  Bilateral Lumbar three-four Lumbar four-five Laminotomies  . LUMBAR SPINE SURGERY  04/07/2016   Dr. Elsner; "?ruptured disc"  . MELANOMA EXCISION Left 02/2011   forearm  . NASAL SINUS SURGERY  1970s   "cut windows in sinus pockets"  . PERCUTANEOUS CORONARY STENT INTERVENTION (PCI-S)  05/19/2013   Procedure: PERCUTANEOUS CORONARY STENT INTERVENTION (PCI-S);  Surgeon: Dalton S [name], MD;  Location: [facility];  Service: Cardiovascular;;  . ULNAR TUNNEL RELEASE Left 04/07/2016   Dr. Elsner    Family History  Problem Relation Age of Onset  . Cancer Mother     intestinal   . Heart disease Mother   . Cancer Father       prostate  . Migraines Daughter   . Leukemia Sister   . Heart disease Sister   . Prostate cancer    . Kidney cancer    . Cancer      Bladder cancer  . Coronary artery disease    . Hypertension Sister   . Hypertension Brother   . Heart attack Neg Hx   . Stroke Neg Hx     Allergies  Allergen Reactions  . Levemir [Insulin Detemir] Swelling    Patient had redness and swelling and tenderness at injection site.  . Codeine Other (See Comments)    Makes him "shakey", is OK with hydrocodone GI UPSET & TREMORS  . Lipitor [Atorvastatin] Other (See Comments)    Muscle aches; liver functions. Patient is   currently taken  . Novolog Mix [Insulin Aspart Prot & Aspart]     Causes skin to swell/itch at injection site  . Other     Brilinta caused SOB    Current Outpatient Prescriptions on File Prior to Visit  Medication Sig Dispense Refill  . acetaminophen (TYLENOL) 500 MG tablet Take 1,000 mg by mouth 2 (two) times daily.    . allopurinol (ZYLOPRIM) 300 MG tablet TAKE 1 TABLET EVERY DAY 90 tablet 1  . aspirin EC 81 MG tablet Take 81 mg by mouth daily.    . atorvastatin (LIPITOR) 40 MG tablet Take 1 tablet (40 mg total) by mouth daily. 90 tablet 0  . B Complex Vitamins (B COMPLEX-B12 PO) Take 1 tablet by mouth at bedtime.    . Cholecalciferol (VITAMIN D) 1000 UNITS capsule Take 1,000 Units by mouth at bedtime.     . clopidogrel (PLAVIX) 75 MG tablet Take 1 tablet (75 mg total) by mouth daily. 90 tablet 3  . cyclobenzaprine (FLEXERIL) 5 MG tablet Take 1 tablet (5 mg total) by mouth 2 (two) times daily as needed for muscle spasms. 60 tablet 2  . fenofibrate 160 MG tablet TAKE 1 TABLET EVERY DAY (Patient taking differently: TAKE 1 TABLET EVERY night) 90 tablet 1  . fluocinonide cream (LIDEX) 0.05 % Apply 1 application topically daily as needed. FOR RASH    . fluticasone (FLONASE) 50 MCG/ACT nasal spray Place 2 sprays into both nostrils daily. (Patient taking differently: Place 2 sprays into  both nostrils as needed. ) 16 g 1  . furosemide (LASIX) 80 MG tablet Take 0.5 tablets (40 mg total) by mouth daily. 45 tablet 1  . insulin NPH Human (NOVOLIN N) 100 UNIT/ML injection Inject 0.45 mLs (45 Units total) into the skin 2 (two) times daily before a meal.    . insulin regular (NOVOLIN R RELION) 100 units/mL injection Inject 0.25-0.3 mLs (25-30 Units total) into the skin 3 (three) times daily before meals. (Patient taking differently: Inject 30 Units into the skin 3 (three) times daily before meals. ) 70 mL 1  . isosorbide mononitrate (IMDUR) 30 MG 24 hr tablet TAKE 1 TABLET EVERY DAY 90 tablet 3  . loratadine (CLARITIN) 10 MG tablet Take 1 tablet (10 mg total) by mouth daily. 30 tablet 0  . metoprolol (LOPRESSOR) 50 MG tablet TAKE 1 TABLET TWICE DAILY 180 tablet 1  . nitroGLYCERIN (NITROSTAT) 0.4 MG SL tablet Place 1 tablet (0.4 mg total) under the tongue every 5 (five) minutes as needed for chest pain. 25 tablet 3  . pantoprazole (PROTONIX) 20 MG tablet Take 1 tablet (20 mg total) by mouth daily. 90 tablet 3  . tamsulosin (FLOMAX) 0.4 MG CAPS capsule TAKE 1 CAPSULE EVERY DAY 90 capsule 1   No current facility-administered medications on file prior to visit.     BP 117/64 (BP Location: Right Arm, Patient Position: Sitting, Cuff Size: Large)   Pulse 76   Temp 97.5 F (36.4 C) (Oral)   Ht 5' 10.5" (1.791 m)   Wt 263 lb 9.6 oz (119.6 kg)   SpO2 98%   BMI 37.29 kg/m       Objective:   Physical Exam  General Appearance- Not in acute distress. Appears mild-moderate fatigue appearance  HEENT Eyes- Scleraeral/Conjuntiva-bilat- Not Yellow. Mouth & Throat- Normal.  Chest and Lung Exam Auscultation: Breath sounds:-Normal. Adventitious sounds:- No Adventitious sounds.  Cardiovascular Auscultation:Rythm - Regular. Heart Sounds -Normal heart sounds.  Abdomen Inspection:-Inspection Normal.  Palpation/Perucssion: Palpation   and Percussion of the abdomen reveal- Non Tender, No  Rebound tenderness, No rigidity(Guarding) and No Palpable abdominal masses.  Liver:-Normal.  Spleen:- Normal.   Back- no cva tenderness.      Assessment & Plan:  You have likely viral or bacterial cause of diarrhea(presently think viral but do want to do stool studies and labs.  I want you to rest, hydrate with propel, follow bland diet guidlines and take tylenol for fever.  Rx lomotil for  diarrhea. For nausea of vomiting, I am prescribing zofran.    There is some chance that your have a bacterial infection so I do want you to get stool panel kit and turn that in as soon as possible/today. Turning stool panel kit earlier will provide [place] with quicker result of studies and more informed decision if antibiotics are needed.  If you feel that your are not improving then would recommend iv hydration through the ED.  Follow up in 4 days or as needed

## 2016-10-15 NOTE — Telephone Encounter (Signed)
Received notification from Dr, Stanford Breed  that pt would like to attend cardiac rehab in [place].  Pt called and message left to please contact.  Contact information provided. Cherre Huger, BSN

## 2016-10-15 NOTE — Patient Instructions (Addendum)
You have likely viral or bacterial cause of diarrhea(presently I  think viral but do want to do stool studies and labs.)  I want you to rest, hydrate with propel, follow bland diet guidlines and take tylenol for fever.  Rx lomotil for  diarrhea. For nausea of vomiting, I am prescribing zofran.    There is some chance that your have a bacterial infection so I do want you to get stool panel kit and turn that in as soon as possible/today. Turning stool panel kit earlier will provide [place] with quicker result of studies and more informed decision if antibiotics are needed.  If you feel that your are not improving then would recommend iv hydration through the ED.  Follow up in 4 days or as needed  Regarding our cardiac rehab if they have not called you by next week then notify me and I can get our referral staff to call over and check on the hold up.

## 2016-10-16 ENCOUNTER — Telehealth (HOSPITAL_COMMUNITY): Payer: Self-pay | Admitting: Family Medicine

## 2016-10-16 DIAGNOSIS — Z955 Presence of coronary angioplasty implant and graft: Secondary | ICD-10-CM

## 2016-10-16 DIAGNOSIS — R5383 Other fatigue: Secondary | ICD-10-CM

## 2016-10-16 LAB — CLOSTRIDIUM DIFFICILE BY PCR: Toxigenic C. Difficile by PCR: NOT DETECTED

## 2016-10-16 NOTE — Telephone Encounter (Signed)
Per [name] with [employer] pt is eligible for Cardiac Rehab with a $10 co-pay.... William Hendrix

## 2016-10-18 LAB — OVA AND PARASITE EXAMINATION: OP: NONE SEEN

## 2016-10-19 LAB — STOOL CULTURE

## 2016-10-22 ENCOUNTER — Telehealth (HOSPITAL_COMMUNITY): Payer: Self-pay | Admitting: *Deleted

## 2016-10-22 NOTE — Telephone Encounter (Signed)
Pt contacted to see how he is feeling.  Pt is much better from the diarrhea.  Pt given coverage information regarding [employer] CPT [id number].  Pt has a 10.00 copay for each session. Pt reported that his daughter will be having major surgery in [place] on 1/29.  Pt requested to call us back to schedule when he returns to [facility].  Pt indicated he would. Cherre Huger, BSN

## 2016-10-30 ENCOUNTER — Encounter: Payer: Self-pay | Admitting: Medical

## 2016-10-30 ENCOUNTER — Ambulatory Visit (HOSPITAL_BASED_OUTPATIENT_CLINIC_OR_DEPARTMENT_OTHER)
Admission: RE | Admit: 2016-10-30 | Discharge: 2016-10-30 | Disposition: A | Discharge status: Home / Self Care | Payer: Medicare HMO | Source: Ambulatory Visit | Attending: Medical | Admitting: Medical

## 2016-10-30 ENCOUNTER — Ambulatory Visit (INDEPENDENT_AMBULATORY_CARE_PROVIDER_SITE_OTHER): Payer: Medicare HMO | Admitting: Medical

## 2016-10-30 VITALS — BP 128/70 | HR 97 | Temp 98.0°F | Resp 16 | Ht 70.5 in | Wt 271.5 lb

## 2016-10-30 DIAGNOSIS — J0100 Acute maxillary sinusitis, unspecified: Secondary | ICD-10-CM | POA: Diagnosis not present

## 2016-10-30 DIAGNOSIS — R05 Cough: Secondary | ICD-10-CM | POA: Diagnosis not present

## 2016-10-30 DIAGNOSIS — R6889 Other general symptoms and signs: Secondary | ICD-10-CM | POA: Diagnosis not present

## 2016-10-30 DIAGNOSIS — J209 Acute bronchitis, unspecified: Secondary | ICD-10-CM | POA: Insufficient documentation

## 2016-10-30 DIAGNOSIS — M791 Myalgia, unspecified site: Secondary | ICD-10-CM

## 2016-10-30 DIAGNOSIS — R059 Cough, unspecified: Secondary | ICD-10-CM

## 2016-10-30 LAB — POCT INFLUENZA A: Rapid Influenza A Ag: NEGATIVE

## 2016-10-30 MED ORDER — ALBUTEROL SULFATE HFA 108 (90 BASE) MCG/ACT IN AERS: 2.0000 | INHALATION_SPRAY | Freq: Four times a day (QID) | RESPIRATORY_TRACT | 0 refills | Status: DC | PRN

## 2016-10-30 MED ORDER — AZITHROMYCIN 250 MG PO TABS: ORAL_TABLET | ORAL | 0 refills | Status: DC

## 2016-10-30 MED ORDER — OSELTAMIVIR PHOSPHATE 75 MG PO CAPS: 75.0000 mg | ORAL_CAPSULE | Freq: Two times a day (BID) | ORAL | 0 refills | Status: DC

## 2016-10-30 MED ORDER — HYDROCODONE-HOMATROPINE 5-1.5 MG/5ML PO SYRP: 5.0000 mL | ORAL_SOLUTION | Freq: Three times a day (TID) | ORAL | 0 refills | Status: DC | PRN

## 2016-10-30 MED FILL — AZITHROMYCIN 250 MG TABLET: 250 | 5 days supply | Qty: 6 | Fill #0

## 2016-10-30 MED FILL — OSELTAMIVIR PHOS 75 MG CAP: 75 | 5 days supply | Qty: 10 | Fill #0

## 2016-10-30 MED FILL — VENTOLIN HFA 90 MCG INHALER: 108 (90 BAS | 25 days supply | Qty: 18 | Fill #0

## 2016-10-30 MED FILL — HYDROCODONE-HOMATROPINE SOL: 5-1.5 | 8 days supply | Qty: 120 | Fill #0

## 2016-10-30 NOTE — Progress Notes (Signed)
Pre visit review using our clinic review tool, if applicable. No additional management support is needed unless otherwise documented below in the visit note/SLS  

## 2016-10-30 NOTE — Patient Instructions (Addendum)
You appear to have bronchitis and sinusitis. Rest hydrate and tylenol for fever. I am prescribing cough medicine hycodan, and azithromycin antibiotic. For your nasal congestion use your flonase nasal steroid.   For myalgia and concern for flu rx tamiflu.  For wheezing rx albuterol.  Please get chest xray today.  Follow up in 7-10 days or as needed

## 2016-10-30 NOTE — Progress Notes (Signed)
Subjective:    Patient ID: William Hendrix, male    DOB: 1941/08/15, 76 y.o.   MRN: [id number]  HPI  Pt in for recent head, sinus pressure, ear pressure and chest congestion. Describes chest congestion and feels like he needs to bring up mucous but can't bring up mucous. Also a lot of points.   Pt has some lower back aches and body aches.shoulder, hips and legs.   Pt has flu vaccine this year.     Review of Systems  Constitutional: Negative for chills and fatigue.  Respiratory: Positive for cough and shortness of breath. Negative for chest tightness and wheezing.        Pt has same level of shortness of breath since November. Since had stents placed.    Cardiovascular: Negative for chest pain and palpitations.  Gastrointestinal: Negative for abdominal pain.  Musculoskeletal: Negative for back pain and gait problem.  Hematological: Negative for adenopathy. Does not bruise/bleed easily.  Psychiatric/Behavioral: Negative for behavioral problems.    Past Medical History:  Diagnosis Date  . AAA (abdominal aortic aneurysm) ([place]) 12/15/2014   a. Mild aneurysmal dilatation of the infrarenal abdominal aorta 3.2 cm - f/u due by 2019.  . Arthritis    "all over" (07/30/2016)  . CAD (coronary artery disease)    a. stent to LAD 2000. b. possible spasm by cath 2001. c. IVUS/PTCA/DES to mLAD 05/2013. d. PTCA/DES of dRCA into ostial rPDA 07/2013. e. PTCA of OM2, DES to Cx, DES to [facility] 07/2016.  Marland Kitchen Chronic bronchitis ([place])   . Chronic diastolic CHF (congestive heart failure) ([place])   . Chronic lower back pain   . Chronic neck pain   . CKD (chronic kidney disease), stage III   . Diabetic peripheral neuropathy ([place])   . Ejection fraction    55%, 07/2010, mild inferior hypo  . GERD (gastroesophageal reflux disease)   . Gout   . H/O diverticulitis of colon 01/31/2015  . H/O hiatal hernia   . History of blood transfusion ~ 10/1940   "had pneumonia"  . HTN (hypertension)   . Hyperlipidemia     . Melanoma of forearm, left ([place]) 02/2011   with wide excision   . Nephrolithiasis   . Obesity   . OSA on CPAP     [name] since 2000  . Positive D-dimer    a. significant elevation ,hospital 07/2010, etiology unclear. b. D Dimer chronically > 20.  . Skin cancer   . Spinal stenosis    a. s/p surgical repair 2013  . Type II diabetes mellitus ([place])   . Ventral hernia      Social History   Social History  . Marital status: Married    Spouse name: N/A  . Number of children: N/A  . Years of education: N/A   Occupational History  . [employer]    Social History Main Topics  . Smoking status: Former Smoker    Packs/day: 3.00    Years: 30.00    Types: Cigarettes    Quit date: 09/17/1996  . Smokeless tobacco: Never Used  . Alcohol use No  . Drug use: No  . Sexual activity: Not Currently    Birth control/ protection: None   Other Topics Concern  . Not on file   Social History Narrative   Married and lives locally with his wife.  [employer].    Past Surgical History:  Procedure Laterality Date  . BACK SURGERY    . CARDIAC CATHETERIZATION N/A 07/30/2016  Procedure: Left Heart Cath and Coronary Angiography;  Surgeon: Nelva Bush, MD;  Location: [place] CV LAB;  Service: Cardiovascular;  Laterality: N/A;  . CARDIAC CATHETERIZATION N/A 07/30/2016   Procedure: Coronary Stent Intervention;  Surgeon: Nelva Bush, MD;  Location: [place] CV LAB;  Service: Cardiovascular;  Laterality: N/A;  Mid CFX and MID RCA  . CARDIAC CATHETERIZATION N/A 07/30/2016   Procedure: Coronary Balloon Angioplasty;  Surgeon: Nelva Bush, MD;  Location: [place] CV LAB;  Service: Cardiovascular;  Laterality: N/A;  OM 1  . CARPAL TUNNEL RELEASE Left   . CATARACT EXTRACTION W/ INTRAOCULAR LENS  IMPLANT, BILATERAL Bilateral ~ 2014  . CERVICAL DISC SURGERY  1990s  . CORONARY ANGIOPLASTY WITH STENT PLACEMENT  05/2013  . CORONARY ANGIOPLASTY WITH STENT PLACEMENT  07/26/2013   DES to RCA  extending to PDA    . CORONARY ANGIOPLASTY WITH STENT PLACEMENT  07/30/2016  . CORONARY ANGIOPLASTY WITH STENT PLACEMENT  2000   CAD  . DENTAL SURGERY  04/2016   "got infected; had to dig it out"  . KNEE ARTHROSCOPY Right 1990's   rt  . LEFT AND RIGHT HEART CATHETERIZATION WITH CORONARY ANGIOGRAM N/A 07/26/2013   Procedure: LEFT AND RIGHT HEART CATHETERIZATION WITH CORONARY ANGIOGRAM;  Surgeon: [name], MD;  Location: [place] CATH LAB;  Service: Cardiovascular;  Laterality: N/A;  . LEFT HEART CATHETERIZATION WITH CORONARY ANGIOGRAM N/A 05/19/2013   Procedure: LEFT HEART CATHETERIZATION WITH CORONARY ANGIOGRAM;  Surgeon: [name], MD;  Location: [facility] CATH LAB;  Service: Cardiovascular;  Laterality: N/A;  . LUMBAR LAMINECTOMY/DECOMPRESSION MICRODISCECTOMY  08/30/2012   Procedure: LUMBAR LAMINECTOMY/DECOMPRESSION MICRODISCECTOMY 2 LEVELS;  Surgeon: [name], MD;  Location: [place] NEURO ORS;  Service: Neurosurgery;  Laterality: Bilateral;  Bilateral Lumbar three-four Lumbar four-five Laminotomies  . LUMBAR SPINE SURGERY  04/07/2016   Dr. Ellene Route; "?ruptured disc"  . MELANOMA EXCISION Left 02/2011   forearm  . NASAL SINUS SURGERY  1970s   "cut windows in sinus pockets"  . PERCUTANEOUS CORONARY STENT INTERVENTION (PCI-S)  05/19/2013   Procedure: PERCUTANEOUS CORONARY STENT INTERVENTION (PCI-S);  Surgeon: [name], MD;  Location: [facility] CATH LAB;  Service: Cardiovascular;;  . ULNAR TUNNEL RELEASE Left 04/07/2016   Dr. Ellene Route    Family History  Problem Relation Age of Onset  . Cancer Mother     intestinal   . Heart disease Mother   . Cancer Father     prostate  . Migraines Daughter   . Leukemia Sister   . Heart disease Sister   . Prostate cancer    . Kidney cancer    . Cancer      Bladder cancer  . Coronary artery disease    . Hypertension Sister   . Hypertension Brother   . Heart attack Neg Hx   . Stroke Neg Hx     Allergies  Allergen Reactions  . Levemir [Insulin  Detemir] Swelling    Patient had redness and swelling and tenderness at injection site.  . Codeine Other (See Comments)    Makes him "shakey", is OK with hydrocodone GI UPSET & TREMORS  . Lipitor [Atorvastatin] Other (See Comments)    Muscle aches; liver functions. Patient is currently taken  . Novolog Mix [Insulin Aspart Prot & Aspart]     Causes skin to swell/itch at injection site  . Other     Brilinta caused SOB    Current Outpatient Prescriptions on File Prior to Visit  Medication Sig Dispense Refill  .  acetaminophen (TYLENOL) 500 MG tablet Take 1,000 mg by mouth 2 (two) times daily.    Marland Kitchen allopurinol (ZYLOPRIM) 300 MG tablet TAKE 1 TABLET EVERY DAY 90 tablet 1  . aspirin EC 81 MG tablet Take 81 mg by mouth daily.    Marland Kitchen atorvastatin (LIPITOR) 40 MG tablet Take 1 tablet (40 mg total) by mouth daily. 90 tablet 0  . B Complex Vitamins (B COMPLEX-B12 PO) Take 1 tablet by mouth at bedtime.    . Cholecalciferol (VITAMIN D) 1000 UNITS capsule Take 1,000 Units by mouth at bedtime.     . clopidogrel (PLAVIX) 75 MG tablet Take 1 tablet (75 mg total) by mouth daily. 90 tablet 3  . cyclobenzaprine (FLEXERIL) 5 MG tablet Take 1 tablet (5 mg total) by mouth 2 (two) times daily as needed for muscle spasms. 60 tablet 2  . diphenoxylate-atropine (LOMOTIL) 2.5-0.025 MG tablet 1-2 tab po qid as needed diarrhea. 30 tablet 0  . fenofibrate 160 MG tablet TAKE 1 TABLET EVERY DAY (Patient taking differently: TAKE 1 TABLET EVERY night) 90 tablet 1  . fluocinonide cream (LIDEX) [id number] % Apply 1 application topically daily as needed. FOR RASH    . fluticasone (FLONASE) 50 MCG/ACT nasal spray Place 2 sprays into both nostrils daily. (Patient taking differently: Place 2 sprays into both nostrils as needed. ) 16 g 1  . furosemide (LASIX) 80 MG tablet Take 0.5 tablets (40 mg total) by mouth daily. 45 tablet 1  . insulin NPH Human (NOVOLIN N) 100 UNIT/ML injection Inject 0.45 mLs (45 Units total) into the skin 2 (two)  times daily before a meal.    . insulin regular (NOVOLIN R RELION) 100 units/mL injection Inject 0.25-0.3 mLs (25-30 Units total) into the skin 3 (three) times daily before meals. (Patient taking differently: Inject 30 Units into the skin 3 (three) times daily before meals. ) 70 mL 1  . isosorbide mononitrate (IMDUR) 30 MG 24 hr tablet TAKE 1 TABLET EVERY DAY 90 tablet 3  . loratadine (CLARITIN) 10 MG tablet Take 1 tablet (10 mg total) by mouth daily. 30 tablet 0  . metoprolol (LOPRESSOR) 50 MG tablet TAKE 1 TABLET TWICE DAILY 180 tablet 1  . nitroGLYCERIN (NITROSTAT) 0.4 MG SL tablet Place 1 tablet (0.4 mg total) under the tongue every 5 (five) minutes as needed for chest pain. 25 tablet 3  . ondansetron (ZOFRAN ODT) 8 MG disintegrating tablet Take 1 tablet (8 mg total) by mouth every 8 (eight) hours as needed for nausea or vomiting. 20 tablet 0  . pantoprazole (PROTONIX) 20 MG tablet Take 1 tablet (20 mg total) by mouth daily. 90 tablet 3  . tamsulosin (FLOMAX) 0.4 MG CAPS capsule TAKE 1 CAPSULE EVERY DAY 90 capsule 1  . TRUE METRIX BLOOD GLUCOSE TEST test strip Check blood sugar 4 times daily.     No current facility-administered medications on file prior to visit.     BP 128/70 (BP Location: Left Arm, Patient Position: Sitting, Cuff Size: Large)   Pulse 97   Temp 98 F (36.7 C) (Oral)   Resp 16   Ht 5' 10.5" (1.791 m)   Wt 271 lb 8 oz (123.2 kg)   SpO2 98%   BMI 38.41 kg/m       Objective:   Physical Exam  General  Mental Status - Alert. General Appearance - Well groomed. Not in acute distress.  Skin Rashes- No Rashes.  HEENT Head- Normal. Ear Auditory Canal - Left- Normal. Right -  Normal.Tympanic Membrane- Left- Normal. Right- Normal. Eye Sclera/Conjunctiva- Left- Normal. Right- Normal. Nose & Sinuses Nasal Mucosa- Left-  Boggy and Congested. Right-  Boggy and  Congested.Bilateral maxillary and frontal sinus pressure. Mouth & Throat Lips: Upper Lip- Normal: no  dryness, cracking, pallor, cyanosis, or vesicular eruption. Lower Lip-Normal: no dryness, cracking, pallor, cyanosis or vesicular eruption. Buccal Mucosa- Bilateral- No Aphthous ulcers. Oropharynx- No Discharge or Erythema. Tonsils: Characteristics- Bilateral- No Erythema or Congestion. Size/Enlargement- Bilateral- No enlargement. Discharge- bilateral-None.  Neck Neck- Supple. No Masses.   Chest and Lung Exam Auscultation: Breath Sounds:-even and unlabored.  Cardiovascular Auscultation:Rythm- Regular, rate and rhythm. Murmurs & Other Heart Sounds:Ausculatation of the heart reveal- No Murmurs.  Lymphatic Head & Neck General Head & Neck Lymphatics: Bilateral: Description- No Localized lymphadenopathy.  Lower ext- no pedal edema. Neg homans signs.       Assessment & Plan:   You appear to have bronchitis and sinusitis. Rest hydrate and tylenol for fever. I am prescribing cough medicine hycodan, and azithromycin antibiotic. For your nasal congestion use your flonase nasal steroid.   For myalgia and concern for flu rx tamiflu.  For wheezing rx albuterol.  Please get chest xray today.  Follow up in 7-10 days or as needed   Kaylin Marcon, Percell Miller, [employer]

## 2016-10-30 NOTE — Telephone Encounter (Signed)
Please see referral to cardiologist

## 2016-11-02 NOTE — Telephone Encounter (Signed)
Patient was seen by Dr Stanford Breed on 09/16/16, I do not see a referral since then. Please advise

## 2016-11-02 NOTE — Telephone Encounter (Signed)
See the referral

## 2016-11-05 ENCOUNTER — Telehealth: Payer: Self-pay | Admitting: Family Medicine

## 2016-11-05 NOTE — Telephone Encounter (Signed)
Attempted to call patient to schedule awv. Patient did not answer will attempt to call patient again at a later time.

## 2016-11-16 ENCOUNTER — Encounter: Payer: Self-pay | Admitting: Family Medicine

## 2016-11-16 ENCOUNTER — Ambulatory Visit (INDEPENDENT_AMBULATORY_CARE_PROVIDER_SITE_OTHER): Payer: Medicare HMO | Admitting: Family Medicine

## 2016-11-16 VITALS — BP 140/78 | HR 87 | Temp 97.0°F | Wt 272.6 lb

## 2016-11-16 DIAGNOSIS — B349 Viral infection, unspecified: Secondary | ICD-10-CM | POA: Diagnosis not present

## 2016-11-16 DIAGNOSIS — R05 Cough: Secondary | ICD-10-CM | POA: Diagnosis not present

## 2016-11-16 DIAGNOSIS — M5416 Radiculopathy, lumbar region: Secondary | ICD-10-CM

## 2016-11-16 DIAGNOSIS — M5441 Lumbago with sciatica, right side: Secondary | ICD-10-CM

## 2016-11-16 DIAGNOSIS — I10 Essential (primary) hypertension: Secondary | ICD-10-CM

## 2016-11-16 DIAGNOSIS — R059 Cough, unspecified: Secondary | ICD-10-CM

## 2016-11-16 DIAGNOSIS — E785 Hyperlipidemia, unspecified: Secondary | ICD-10-CM

## 2016-11-16 DIAGNOSIS — E663 Overweight: Secondary | ICD-10-CM

## 2016-11-16 MED ORDER — MUPIROCIN 2 % EX OINT: 1.0000 "application " | TOPICAL_OINTMENT | Freq: Two times a day (BID) | CUTANEOUS | 0 refills | Status: DC

## 2016-11-16 MED ORDER — CYCLOBENZAPRINE HCL 5 MG PO TABS: 5.0000 mg | ORAL_TABLET | Freq: Two times a day (BID) | ORAL | 2 refills | Status: DC | PRN

## 2016-11-16 MED FILL — MUPIROCIN 2% OINTMENT: 2 | 10 days supply | Qty: 22 | Fill #0

## 2016-11-16 NOTE — Assessment & Plan Note (Signed)
Encouraged DASH diet, decrease po intake and increase exercise as tolerated. Needs 7-8 hours of sleep nightly. Avoid trans fats, eat small, frequent meals every 4-5 hours with lean proteins, complex carbs and healthy fats. Minimize simple carbs, GMO foods. 

## 2016-11-16 NOTE — Assessment & Plan Note (Signed)
Well controlled, slight increase with acute pain. Encouraged heart healthy diet such as the DASH diet and exercise as tolerated.

## 2016-11-16 NOTE — Patient Instructions (Signed)
Tylenol ES 500 mg tabs, 2 tabs po 3 x daily as needed for pain   Back Pain, Adult Back pain is very common in adults.The cause of back pain is rarely dangerous and the pain often gets better over time.The cause of your back pain may not be known. Some common causes of back pain include:  Strain of the muscles or ligaments supporting the spine.  Wear and tear (degeneration) of the spinal disks.  Arthritis.  Direct injury to the back. For many people, back pain may return. Since back pain is rarely dangerous, most people can learn to manage this condition on their own. Follow these instructions at home: Watch your back pain for any changes. The following actions may help to lessen any discomfort you are feeling:  Remain active. It is stressful on your back to sit or stand in one place for long periods of time. Do not sit, drive, or stand in one place for more than 30 minutes at a time. Take short walks on even surfaces as soon as you are able.Try to increase the length of time you walk each day.  Exercise regularly as directed by your health care provider. Exercise helps your back heal faster. It also helps avoid future injury by keeping your muscles strong and flexible.  Do not stay in bed.Resting more than 1-2 days can delay your recovery.  Pay attention to your body when you bend and lift. The most comfortable positions are those that put less stress on your recovering back. Always use proper lifting techniques, including:  Bending your knees.  Keeping the load close to your body.  Avoiding twisting.  Find a comfortable position to sleep. Use a firm mattress and lie on your side with your knees slightly bent. If you lie on your back, put a pillow under your knees.  Avoid feeling anxious or stressed.Stress increases muscle tension and can worsen back pain.It is important to recognize when you are anxious or stressed and learn ways to manage it, such as with exercise.  Take  medicines only as directed by your health care provider. Over-the-counter medicines to reduce pain and inflammation are often the most helpful.Your health care provider may prescribe muscle relaxant drugs.These medicines help dull your pain so you can more quickly return to your normal activities and healthy exercise.  Apply ice to the injured area:  Put ice in a plastic bag.  Place a towel between your skin and the bag.  Leave the ice on for 20 minutes, 2-3 times a day for the first 2-3 days. After that, ice and heat may be alternated to reduce pain and spasms.  Maintain a healthy weight. Excess weight puts extra stress on your back and makes it difficult to maintain good posture. Contact a health care provider if:  You have pain that is not relieved with rest or medicine.  You have increasing pain going down into the legs or buttocks.  You have pain that does not improve in one week.  You have night pain.  You lose weight.  You have a fever or chills. Get help right away if:  You develop new bowel or bladder control problems.  You have unusual weakness or numbness in your arms or legs.  You develop nausea or vomiting.  You develop abdominal pain.  You feel faint. This information is not intended to replace advice given to you by your health care provider. Make sure you discuss any questions you have with your health care  provider. Document Released: 09/28/2005 Document Revised: 02/06/2016 Document Reviewed: 01/30/2014 Elsevier Interactive Patient Education  2017 [employer].

## 2016-11-16 NOTE — Progress Notes (Signed)
Patient ID: William Hendrix, male   DOB: [birth year], 76 y.o.   MRN: [id number] I acted as a Education administrator for Dr. Charlett Blake. [name], [place]   Subjective:    Patient ID: William Hendrix, male    DOB: 1941-10-04, 76 y.o.   MRN: [id number]  Chief Complaint  Patient presents with  . Follow-up  . Diabetes  . Hypertension  . Back Pain    Diabetes  Pertinent negatives for hypoglycemia include no headaches. Associated symptoms include chest pain. Pertinent negatives for diabetes include no blurred vision.  Hypertension  Associated symptoms include chest pain and shortness of breath. Pertinent negatives include no blurred vision, headaches, malaise/fatigue or palpitations.  Back Pain  This is a new problem. The current episode started 1 to 4 weeks ago. The problem occurs constantly. The problem has been gradually worsening since onset. The quality of the pain is described as aching. The pain does not radiate. The pain is at a severity of 9/10. The pain is the same all the time. The symptoms are aggravated by standing and twisting. Stiffness is present all day. Associated symptoms include chest pain and pelvic pain. Pertinent negatives include no abdominal pain, bladder incontinence, fever, headaches or numbness. The treatment provided no relief.    Patient is in today for follow up on numerous medical concerns including HTN and DM. He has a history of back pain but it is usually manageable. He has not had a trauma but it has flared recently. No new incontinence. Denies CP/palp/SOB/HA/fevers/GI c/o. Taking meds as prescribed  Past Medical History:  Diagnosis Date  . AAA (abdominal aortic aneurysm) ([place]) 12/15/2014   a. Mild aneurysmal dilatation of the infrarenal abdominal aorta 3.2 cm - f/u due by 2019.  . Arthritis    "all over" (07/30/2016)  . CAD (coronary artery disease)    a. stent to LAD 2000. b. possible spasm by cath 2001. c. IVUS/PTCA/DES to mLAD 05/2013. d. PTCA/DES of dRCA into ostial rPDA 07/2013.  e. PTCA of OM2, DES to Cx, DES to [facility] 07/2016.  Marland Kitchen Chronic bronchitis ([place])   . Chronic diastolic CHF (congestive heart failure) ([place])   . Chronic lower back pain   . Chronic neck pain   . CKD (chronic kidney disease), stage III   . Diabetic peripheral neuropathy ([place])   . Ejection fraction    55%, 07/2010, mild inferior hypo  . GERD (gastroesophageal reflux disease)   . Gout   . H/O diverticulitis of colon 01/31/2015  . H/O hiatal hernia   . History of blood transfusion ~ 10/1940   "had pneumonia"  . HTN (hypertension)   . Hyperlipidemia   . Melanoma of forearm, left ([place]) 02/2011   with wide excision   . Nephrolithiasis   . Obesity   . OSA on CPAP     [name] since 2000  . Positive D-dimer    a. significant elevation ,hospital 07/2010, etiology unclear. b. D Dimer chronically > 20.  . Skin cancer   . Spinal stenosis    a. s/p surgical repair 2013  . Type II diabetes mellitus ([place])   . Ventral hernia     Past Surgical History:  Procedure Laterality Date  . BACK SURGERY    . CARDIAC CATHETERIZATION N/A 07/30/2016   Procedure: Left Heart Cath and Coronary Angiography;  Surgeon: Nelva Bush, MD;  Location: [place] [facility] CV LAB;  Service: Cardiovascular;  Laterality: N/A;  . CARDIAC CATHETERIZATION N/A 07/30/2016   Procedure: Coronary Stent Intervention;  Surgeon: Nelva Bush, MD;  Location: [place] CV LAB;  Service: Cardiovascular;  Laterality: N/A;  Mid CFX and MID RCA  . CARDIAC CATHETERIZATION N/A 07/30/2016   Procedure: Coronary Balloon Angioplasty;  Surgeon: Nelva Bush, MD;  Location: [place] CV LAB;  Service: Cardiovascular;  Laterality: N/A;  OM 1  . CARPAL TUNNEL RELEASE Left   . CATARACT EXTRACTION W/ INTRAOCULAR LENS  IMPLANT, BILATERAL Bilateral ~ 2014  . CERVICAL DISC SURGERY  1990s  . CORONARY ANGIOPLASTY WITH STENT PLACEMENT  05/2013  . CORONARY ANGIOPLASTY WITH STENT PLACEMENT  07/26/2013   DES to RCA extending to PDA    . CORONARY  ANGIOPLASTY WITH STENT PLACEMENT  07/30/2016  . CORONARY ANGIOPLASTY WITH STENT PLACEMENT  2000   CAD  . DENTAL SURGERY  04/2016   "got infected; had to dig it out"  . KNEE ARTHROSCOPY Right 1990's   rt  . LEFT AND RIGHT HEART CATHETERIZATION WITH CORONARY ANGIOGRAM N/A 07/26/2013   Procedure: LEFT AND RIGHT HEART CATHETERIZATION WITH CORONARY ANGIOGRAM;  Surgeon: [name], MD;  Location: [facility] C[facility] LAB;  Service: Cardiovascular;  Laterality: N/A;  . LEFT HEART CATHETERIZATION WITH CORONARY ANGIOGRAM N/A 05/19/2013   Procedure: LEFT HEART CATHETERIZATION WITH CORONARY ANGIOGRAM;  Surgeon: [name], MD;  Location: [facility] CATH LAB;  Service: Cardiovascular;  Laterality: N/A;  . LUMBAR LAMINECTOMY/DECOMPRESSION MICRODISCECTOMY  08/30/2012   Procedure: LUMBAR LAMINECTOMY/DECOMPRESSION MICRODISCECTOMY 2 LEVELS;  Surgeon: [name], MD;  Location: [facility] NEURO ORS;  Service: Neurosurgery;  Laterality: Bilateral;  Bilateral Lumbar three-four Lumbar four-five Laminotomies  . LUMBAR SPINE SURGERY  04/07/2016   Dr. Ellene Route; "?ruptured disc"  . MELANOMA EXCISION Left 02/2011   forearm  . NASAL SINUS SURGERY  1970s   "cut windows in sinus pockets"  . PERCUTANEOUS CORONARY STENT INTERVENTION (PCI-S)  05/19/2013   Procedure: PERCUTANEOUS CORONARY STENT INTERVENTION (PCI-S);  Surgeon: [name], MD;  Location: [facility] CATH LAB;  Service: Cardiovascular;;  . ULNAR TUNNEL RELEASE Left 04/07/2016   Dr. Ellene Route    Family History  Problem Relation Age of Onset  . Cancer Mother     intestinal   . Heart disease Mother   . Cancer Father     prostate  . Migraines Daughter   . Leukemia Sister   . Heart disease Sister   . Prostate cancer    . Kidney cancer    . Cancer      Bladder cancer  . Coronary artery disease    . Hypertension Sister   . Hypertension Brother   . Heart attack Neg Hx   . Stroke Neg Hx     Social History   Social History  . Marital status: Married    Spouse name: N/A  .  Number of children: N/A  . Years of education: N/A   Occupational History  . [employer]    Social History Main Topics  . Smoking status: Former Smoker    Packs/day: 3.00    Years: 30.00    Types: Cigarettes    Quit date: 09/17/1996  . Smokeless tobacco: Never Used  . Alcohol use No  . Drug use: No  . Sexual activity: Not Currently    Birth control/ protection: None   Other Topics Concern  . Not on file   Social History Narrative   Married and lives locally with his wife.  [employer].    Outpatient Medications Prior to Visit  Medication Sig Dispense Refill  . acetaminophen (TYLENOL) 500 MG tablet  Take 1,000 mg by mouth 2 (two) times daily.    Marland Kitchen albuterol (PROVENTIL HFA;VENTOLIN HFA) 108 (90 Base) MCG/ACT inhaler Inhale 2 puffs into the lungs every 6 (six) hours as needed for wheezing or shortness of breath. 1 Inhaler 0  . allopurinol (ZYLOPRIM) 300 MG tablet TAKE 1 TABLET EVERY DAY 90 tablet 1  . aspirin EC 81 MG tablet Take 81 mg by mouth daily.    Marland Kitchen atorvastatin (LIPITOR) 40 MG tablet Take 1 tablet (40 mg total) by mouth daily. 90 tablet 0  . B Complex Vitamins (B COMPLEX-B12 PO) Take 1 tablet by mouth at bedtime.    . Cholecalciferol (VITAMIN D) 1000 UNITS capsule Take 1,000 Units by mouth at bedtime.     . clopidogrel (PLAVIX) 75 MG tablet Take 1 tablet (75 mg total) by mouth daily. 90 tablet 3  . diphenoxylate-atropine (LOMOTIL) 2.5-0.025 MG tablet 1-2 tab po qid as needed diarrhea. 30 tablet 0  . fenofibrate 160 MG tablet TAKE 1 TABLET EVERY DAY (Patient taking differently: TAKE 1 TABLET EVERY night) 90 tablet 1  . fluocinonide cream (LIDEX) [id number] % Apply 1 application topically daily as needed. FOR RASH    . fluticasone (FLONASE) 50 MCG/ACT nasal spray Place 2 sprays into both nostrils daily. (Patient taking differently: Place 2 sprays into both nostrils as needed. ) 16 g 1  . furosemide (LASIX) 80 MG tablet Take 0.5 tablets (40 mg total) by mouth daily. 45 tablet 1  .  HYDROcodone-homatropine (HYCODAN) 5-1.5 MG/5ML syrup Take 5 mLs by mouth every 8 (eight) hours as needed for cough. 120 mL 0  . insulin NPH Human (NOVOLIN N) 100 UNIT/ML injection Inject 0.45 mLs (45 Units total) into the skin 2 (two) times daily before a meal.    . insulin regular (NOVOLIN R RELION) 100 units/mL injection Inject 0.25-0.3 mLs (25-30 Units total) into the skin 3 (three) times daily before meals. (Patient taking differently: Inject 30 Units into the skin 3 (three) times daily before meals. ) 70 mL 1  . isosorbide mononitrate (IMDUR) 30 MG 24 hr tablet TAKE 1 TABLET EVERY DAY 90 tablet 3  . loratadine (CLARITIN) 10 MG tablet Take 1 tablet (10 mg total) by mouth daily. 30 tablet 0  . metoprolol (LOPRESSOR) 50 MG tablet TAKE 1 TABLET TWICE DAILY 180 tablet 1  . nitroGLYCERIN (NITROSTAT) 0.4 MG SL tablet Place 1 tablet (0.4 mg total) under the tongue every 5 (five) minutes as needed for chest pain. 25 tablet 3  . ondansetron (ZOFRAN ODT) 8 MG disintegrating tablet Take 1 tablet (8 mg total) by mouth every 8 (eight) hours as needed for nausea or vomiting. 20 tablet 0  . pantoprazole (PROTONIX) 20 MG tablet Take 1 tablet (20 mg total) by mouth daily. 90 tablet 3  . tamsulosin (FLOMAX) 0.4 MG CAPS capsule TAKE 1 CAPSULE EVERY DAY 90 capsule 1  . TRUE METRIX BLOOD GLUCOSE TEST test strip Check blood sugar 4 times daily.    Marland Kitchen azithromycin (ZITHROMAX) 250 MG tablet Take 2 tablets by mouth on day 1, followed by 1 tablet by mouth daily for 4 days. 6 tablet 0  . cyclobenzaprine (FLEXERIL) 5 MG tablet Take 1 tablet (5 mg total) by mouth 2 (two) times daily as needed for muscle spasms. 60 tablet 2  . oseltamivir (TAMIFLU) 75 MG capsule Take 1 capsule (75 mg total) by mouth 2 (two) times daily. 10 capsule 0   No facility-administered medications prior to visit.  Allergies  Allergen Reactions  . Levemir [Insulin Detemir] Swelling    Patient had redness and swelling and tenderness at injection  site.  . Codeine Other (See Comments)    Makes him "shakey", is OK with hydrocodone GI UPSET & TREMORS  . Lipitor [Atorvastatin] Other (See Comments)    Muscle aches; liver functions. Patient is currently taken  . Novolog Mix [Insulin Aspart Prot & Aspart]     Causes skin to swell/itch at injection site  . Other     Brilinta caused SOB    Review of Systems  Constitutional: Negative for fever and malaise/fatigue.  HENT: Negative for congestion.   Eyes: Negative for blurred vision.  Respiratory: Positive for cough, shortness of breath and wheezing. Negative for sputum production.   Cardiovascular: Positive for chest pain. Negative for palpitations and leg swelling.  Gastrointestinal: Negative for abdominal pain and vomiting.  Genitourinary: Positive for pelvic pain. Negative for bladder incontinence.  Musculoskeletal: Positive for back pain.       Left    Skin: Negative for rash.  Neurological: Negative for loss of consciousness, numbness and headaches.       Objective:    Physical Exam  Constitutional: He is oriented to person, place, and time. He appears well-developed and well-nourished. No distress.  HENT:  Head: Normocephalic and atraumatic.  Eyes: Conjunctivae are normal.  Neck: Normal range of motion. No thyromegaly present.  Cardiovascular: Normal rate and regular rhythm.   Pulmonary/Chest: Effort normal. He has no wheezes. He has rales.  Slight rales b/l bases.  Abdominal: Soft. Bowel sounds are normal. There is no tenderness.  Musculoskeletal: Normal range of motion. He exhibits no edema or deformity.  Neurological: He is alert and oriented to person, place, and time.  Skin: Skin is warm and dry. He is not diaphoretic.  Psychiatric: He has a normal mood and affect.    BP 140/78 (BP Location: Left Arm, Patient Position: Sitting, Cuff Size: Large)   Pulse 87   Temp 97 F (36.1 C) (Oral)   Wt 272 lb 9.6 oz (123.7 kg)   SpO2 97%   BMI 38.56 kg/m  Wt Readings  from Last 3 Encounters:  11/16/16 272 lb 9.6 oz (123.7 kg)  10/30/16 271 lb 8 oz (123.2 kg)  10/15/16 263 lb 9.6 oz (119.6 kg)     Lab Results  Component Value Date   WBC 7.3 11/16/2016   HGB 15.1 11/16/2016   HCT 43.2 11/16/2016   PLT 199.0 11/16/2016   GLUCOSE 237 (H) 11/16/2016   CHOL 133 05/15/2016   TRIG 214.0 (H) 05/15/2016   HDL 26.80 (L) 05/15/2016   LDLDIRECT 73.0 05/15/2016   LDLCALC 31 04/09/2014   ALT 33 11/16/2016   AST 35 11/16/2016   NA 139 11/16/2016   K 3.8 11/16/2016   CL 104 11/16/2016   CREATININE 1.27 11/16/2016   BUN 23 11/16/2016   CO2 28 11/16/2016   TSH 2.75 09/14/2016   INR 1.0 07/25/2013   HGBA1C 6.5 08/28/2016   MICROALBUR 0.7 08/02/2014    Lab Results  Component Value Date   TSH 2.75 09/14/2016   Lab Results  Component Value Date   WBC 7.3 11/16/2016   HGB 15.1 11/16/2016   HCT 43.2 11/16/2016   MCV 93.5 11/16/2016   PLT 199.0 11/16/2016   Lab Results  Component Value Date   NA 139 11/16/2016   K 3.8 11/16/2016   CO2 28 11/16/2016   GLUCOSE 237 (H) 11/16/2016   BUN  23 11/16/2016   CREATININE 1.27 11/16/2016   BILITOT 0.4 11/16/2016   ALKPHOS 60 11/16/2016   AST 35 11/16/2016   ALT 33 11/16/2016   PROT 7.4 11/16/2016   ALBUMIN 4.3 11/16/2016   CALCIUM 10.1 11/16/2016   ANIONGAP 10 08/02/2016   GFR 58.50 (L) 11/16/2016   Lab Results  Component Value Date   CHOL 133 05/15/2016   Lab Results  Component Value Date   HDL 26.80 (L) 05/15/2016   Lab Results  Component Value Date   LDLCALC 31 04/09/2014   Lab Results  Component Value Date   TRIG 214.0 (H) 05/15/2016   Lab Results  Component Value Date   CHOLHDL 5 05/15/2016   Lab Results  Component Value Date   HGBA1C 6.5 08/28/2016       Assessment & Plan:   Problem List Items Addressed This Visit    RESOLVED: Overweight    Encouraged DASH diet, decrease po intake and increase exercise as tolerated. Needs 7-8 hours of sleep nightly. Avoid trans fats, eat  small, frequent meals every 4-5 hours with lean proteins, complex carbs and healthy fats. Minimize simple carbs, GMO foods.      Hyperlipidemia    Encouraged heart healthy diet, increase exercise, avoid trans fats, consider a krill oil cap daily      Essential hypertension    Well controlled, slight increase with acute pain. Encouraged heart healthy diet such as the DASH diet and exercise as tolerated.       Relevant Orders   Comprehensive metabolic panel (Completed)   CBC with Differential/Platelet (Completed)   Lumbar radiculopathy    Flare in low back pain mostly left sided this week. No falls or trauma, no incontinence. Add Lidocaine patches prn. Alternate Tylenol and Ibuprofen.      Relevant Medications   cyclobenzaprine (FLEXERIL) 5 MG tablet   Other Relevant Orders   Urinalysis (Completed)   Urine culture (Completed)   Right-sided low back pain with right-sided sciatica   Relevant Medications   cyclobenzaprine (FLEXERIL) 5 MG tablet   Viral illness - Primary    Encouraged increased rest and hydration, add probiotics, zinc such as Coldeze or Xicam. Treat fevers as needed. Mucinex, Vitamin C, aged garlic and report worsening symptoms.      Relevant Medications   mupirocin ointment (BACTROBAN) 2 %   Other Relevant Orders   CBC with Differential/Platelet (Completed)    Other Visit Diagnoses    Cough       Relevant Orders   CBC with Differential/Platelet (Completed)      I have discontinued Mr. Nair azithromycin and oseltamivir. I am also having him start on mupirocin ointment. Additionally, I am having him maintain his Vitamin D, fluocinonide cream, aspirin EC, acetaminophen, nitroGLYCERIN, fluticasone, isosorbide mononitrate, insulin regular, fenofibrate, metoprolol, tamsulosin, atorvastatin, pantoprazole, B Complex Vitamins (B COMPLEX-B12 PO), furosemide, loratadine, allopurinol, insulin NPH Human, clopidogrel, TRUE METRIX BLOOD GLUCOSE TEST, ondansetron,  diphenoxylate-atropine, HYDROcodone-homatropine, albuterol, and cyclobenzaprine.  Meds ordered this encounter  Medications  . mupirocin ointment (BACTROBAN) 2 %    Sig: Place 1 application into the nose 2 (two) times daily.    Dispense:  22 g    Refill:  0  . cyclobenzaprine (FLEXERIL) 5 MG tablet    Sig: Take 1 tablet (5 mg total) by mouth 2 (two) times daily as needed for muscle spasms.    Dispense:  60 tablet    Refill:  2    CMA served as scribe during this visit.  History, Physical and Plan performed by medical provider. Documentation and orders reviewed and attested to.  Penni Homans, MD

## 2016-11-16 NOTE — Assessment & Plan Note (Addendum)
Flare in low back pain mostly left sided this week. No falls or trauma, no incontinence. Add Lidocaine patches prn. Alternate Tylenol and Ibuprofen.

## 2016-11-16 NOTE — Progress Notes (Signed)
Pre visit review using our clinic review tool, if applicable. No additional management support is needed unless otherwise documented below in the visit note. 

## 2016-11-17 LAB — URINALYSIS
Bilirubin Urine: NEGATIVE
Hgb urine dipstick: NEGATIVE
KETONES UR: NEGATIVE
LEUKOCYTES UA: NEGATIVE
Nitrite: NEGATIVE
PH: 6.5 (ref 5.0–8.0)
SPECIFIC GRAVITY, URINE: 1.015 (ref 1.000–1.030)
Total Protein, Urine: NEGATIVE
URINE GLUCOSE: 500 — AB
Urobilinogen, UA: 0.2 (ref 0.0–1.0)

## 2016-11-17 LAB — COMPREHENSIVE METABOLIC PANEL
ALT: 33 U/L (ref 0–53)
AST: 35 U/L (ref 0–37)
Albumin: 4.3 g/dL (ref 3.5–5.2)
Alkaline Phosphatase: 60 U/L (ref 39–117)
BUN: 23 mg/dL (ref 6–23)
CALCIUM: 10.1 mg/dL (ref 8.4–10.5)
CHLORIDE: 104 meq/L (ref 96–112)
CO2: 28 mEq/L (ref 19–32)
Creatinine, Ser: 1.27 mg/dL (ref 0.40–1.50)
GFR: 58.5 mL/min — AB (ref >60.00)
Glucose, Bld: 237 mg/dL — ABNORMAL HIGH (ref 70–99)
Potassium: 3.8 mEq/L (ref 3.5–5.1)
Sodium: 139 mEq/L (ref 135–145)
Total Bilirubin: 0.4 mg/dL (ref 0.2–1.2)
Total Protein: 7.4 g/dL (ref 6.0–8.3)

## 2016-11-17 LAB — CBC WITH DIFFERENTIAL/PLATELET
BASOS PCT: 0.9 % (ref 0.0–3.0)
Basophils Absolute: 0.1 10*3/uL (ref 0.0–0.1)
EOS PCT: 2.3 % (ref 0.0–5.0)
Eosinophils Absolute: 0.2 10*3/uL (ref 0.0–0.7)
HEMATOCRIT: 43.2 % (ref 39.0–52.0)
Hemoglobin: 15.1 g/dL (ref 13.0–17.0)
LYMPHS PCT: 38.8 % (ref 12.0–46.0)
Lymphs Abs: 2.8 10*3/uL (ref 0.7–4.0)
MCHC: 35 g/dL (ref 30.0–36.0)
MCV: 93.5 fl (ref 78.0–100.0)
MONOS PCT: 6.2 % (ref 3.0–12.0)
Monocytes Absolute: 0.4 10*3/uL (ref 0.1–1.0)
NEUTROS ABS: 3.8 10*3/uL (ref 1.4–7.7)
Neutrophils Relative %: 51.8 % (ref 43.0–77.0)
PLATELETS: 199 10*3/uL (ref 150.0–400.0)
RBC: 4.62 Mil/uL (ref 4.22–5.81)
RDW: 14.1 % (ref 11.5–15.5)
WBC: 7.3 10*3/uL (ref 4.0–10.5)

## 2016-11-17 LAB — URINE CULTURE: Organism ID, Bacteria: NO GROWTH

## 2016-11-22 DIAGNOSIS — B349 Viral infection, unspecified: Secondary | ICD-10-CM | POA: Insufficient documentation

## 2016-11-22 DIAGNOSIS — M5441 Lumbago with sciatica, right side: Secondary | ICD-10-CM | POA: Insufficient documentation

## 2016-11-22 NOTE — Assessment & Plan Note (Signed)
Encouraged DASH diet, decrease po intake and increase exercise as tolerated. Needs 7-8 hours of sleep nightly. Avoid trans fats, eat small, frequent meals every 4-5 hours with lean proteins, complex carbs and healthy fats. Minimize simple carbs. Consider bariatric referral 

## 2016-11-22 NOTE — Assessment & Plan Note (Signed)
Encouraged increased rest and hydration, add probiotics, zinc such as Coldeze or Xicam. Treat fevers as needed. Mucinex, Vitamin C, aged garlic and report worsening symptoms.

## 2016-11-22 NOTE — Assessment & Plan Note (Signed)
Encouraged heart healthy diet, increase exercise, avoid trans fats, consider a krill oil cap daily 

## 2016-11-26 ENCOUNTER — Ambulatory Visit (INDEPENDENT_AMBULATORY_CARE_PROVIDER_SITE_OTHER): Payer: Medicare HMO | Admitting: Internal Medicine

## 2016-11-26 ENCOUNTER — Encounter: Payer: Self-pay | Admitting: Internal Medicine

## 2016-11-26 VITALS — BP 134/84 | HR 85 | Ht 68.5 in | Wt 270.0 lb

## 2016-11-26 DIAGNOSIS — E1165 Type 2 diabetes mellitus with hyperglycemia: Secondary | ICD-10-CM

## 2016-11-26 DIAGNOSIS — Z794 Long term (current) use of insulin: Secondary | ICD-10-CM | POA: Diagnosis not present

## 2016-11-26 DIAGNOSIS — E1159 Type 2 diabetes mellitus with other circulatory complications: Secondary | ICD-10-CM | POA: Diagnosis not present

## 2016-11-26 DIAGNOSIS — IMO0002 Reserved for concepts with insufficient information to code with codable children: Secondary | ICD-10-CM

## 2016-11-26 LAB — POCT GLYCOSYLATED HEMOGLOBIN (HGB A1C): Hemoglobin A1C: 7.7

## 2016-11-26 NOTE — Patient Instructions (Addendum)
Please increase ReliOn insulin doses:  Insulin Before breakfast Before lunch Before dinner  Regular 30-35 30-35 30-35  NPH 45  45   Please check some sugars at bedtime, also.  Please come back for a follow-up appointment in 3 months.

## 2016-11-26 NOTE — Addendum Note (Signed)
Addended by: Caprice Beaver T on: 11/26/2016 11:04 AM   Modules accepted: Orders

## 2016-11-26 NOTE — Progress Notes (Signed)
Patient ID: William Hendrix, male   DOB: [birth year], 76 y.o.   MRN: [id number]  HPI: William Hendrix is a 76 y.o.-year-old male, returning for f/u for DM2, dx 2009, insulin-dependent, uncontrolled, with complications (CAD s/p AMI 2017, CKD stage 3). Last visit 3 mo ago.  His daughter has acute pancreatitis >> she has a tight sphincter of Oddi - has been admitted many times for this >> now at [place], [place] >> getting extensive surgery. His wife is there with her x 1 mo >> he has been eating junk food >> sugars are higher.  He had the flu and sinusitis x2.  He will start cardiac rehab.   Last hemoglobin A1c was: Lab Results  Component Value Date   HGBA1C 6.5 08/28/2016   HGBA1C 7.0 05/28/2016   HGBA1C 7.8 02/26/2016   He is now on: ReliOn insulin doses: Insulin Before breakfast Before lunch Before dinner  Regular 30 30 30   NPH 45  45  We cannot use Metformin 2/2 CKD. We stopped Januvia at last visit >> could not afford ($100/3 mo)  We cannot use Levemir >> developed a severe skin reaction to Levemir  We stopped Amaryl 4 mg.  He brings his log. He checks 3 times a day - forgot log >> sugars are higher: - am:  120-169 >> 114, 147-178 >> 90-146 >> 79, 90-141, 189 >> 158-190 - Before lunch:   146-179 >> 123, 130-148 >> 81, 118-149, 161 >> 160-185 - before dinner: 109-173, 191, 225 >> 66, 81-151 >> 51, 75-167, 179, 187 >> 160-185 - Before bedtime: 175-240 >> 157-179 >> 102 >> n/c >> 87-177 >> n/c Lowest sugar was 42 x1, 60 >> 51 x1 >> ,  he does not know whether he has hypoglycemia awareness.  Highest sugar was 171 >> 189.  Pt has chronic kidney disease, last BUN/creatinine was:  Lab Results  Component Value Date   BUN 23 11/16/2016   CREATININE 1.27 11/16/2016  ACR 0.7. Last lipids: Lab Results  Component Value Date   CHOL 133 05/15/2016   HDL 26.80 (L) 05/15/2016   LDLCALC 31 04/09/2014   LDLDIRECT 73.0 05/15/2016   TRIG 214.0 (H) 05/15/2016   CHOLHDL  5 05/15/2016   Pt's last eye exam was 09/30/2015. No DR. Had cataract sx in 2011. Denies numbness and tingling in his legs.  He had back surgery in 03/2016. He was admitted 2x in 2017 for: CP/SOB >> AMI. He had 2 stents placed.   PMH: Pt also also has a history of CAD, hypertension, hyperlipidemia, chronic kidney disease stage III, history of nephrolithiasis, obesity, hiatal hernia, obstructive sleep apnea, hyperglyceridemia, melanoma, spinal stenosis.  ROS: Constitutional: + weight gain, no fatigue, no subjective hyperthermia/hypothermia Eyes: no blurry vision, no xerophthalmia ENT: no sore throat, no nodules palpated in throat, no dysphagia/odynophagia, no hoarseness Cardiovascular: no CP/+ SOB/no palpitations/+ leg swelling Respiratory: no cough/+ SOB Gastrointestinal: no N/V/D/C Musculoskeletal: no muscle/joint aches Skin: no rashes  I reviewed pt's medications, allergies, PMH, social hx, family hx, and changes were documented in the history of present illness. Otherwise, unchanged from my initial visit note.  PE: Ht 5' 8.5" (1.74 m)   Wt 270 lb (122.5 kg)   BMI 40.46 kg/m  Body mass index is 40.46 kg/m. Wt Readings from Last 3 Encounters:  11/26/16 270 lb (122.5 kg)  11/16/16 272 lb 9.6 oz (123.7 kg)  10/30/16 271 lb 8 oz (123.2 kg)  Constitutional: overweight, in NAD Eyes: PERRLA, EOMI,  no exophthalmos ENT: moist mucous membranes, no thyromegaly, no cervical lymphadenopathy Cardiovascular: RRR, No MRG, + leg swelling B Respiratory: CTA B Gastrointestinal: abdomen soft, NT, ND, BS+ Musculoskeletal: no deformities, strength intact in all 4 Skin: moist, warm, no rashes  ASSESSMENT: 1. DM2, insulin-dependent, uncontrolled, with complications - CAD, s/p stents - He had stenting of distal RCA/PDA in 07/2013. Prior stenting of LAD in 04/2013. 2 new stents: PCI to left circumflex and the mid RCA on 07/30/2016. - AAA - CKD stage 3  PLAN:  1. Patient with improved diabetes  control after switching to basal-bolus insulin regimen, but now higher CBGs after he started to eat fast food x 1 mo as his wife is not home. Strongly advised him to go back to eating healthier but will also increase his R insulin a little. - I advised him to: Patient Instructions   Please increase ReliOn insulin doses:  Insulin Before breakfast Before lunch Before dinner  Regular 30-35 30-35 30-35  NPH 45  45   Please check some sugars at bedtime, also.  Please come back for a follow-up appointment in 3 months.  - He is up-to-date with flu shot - UTD with eye exams - HbA1c today: 7.7% (worse!) - I will see the patient back in 3 mo with his sugar log  William Kingdom, MD PhD [facility] Endocrinology

## 2016-12-22 ENCOUNTER — Telehealth: Payer: Self-pay | Admitting: *Deleted

## 2016-12-22 DIAGNOSIS — I714 Abdominal aortic aneurysm, without rupture, unspecified: Secondary | ICD-10-CM

## 2016-12-22 NOTE — Telephone Encounter (Signed)
Unable to reach pt or leave a message, 12-31-16 will be one year since the last abdominal US to follow AAA. Patient has this done at the [place] location.

## 2016-12-23 ENCOUNTER — Telehealth: Payer: Self-pay | Admitting: Cardiology

## 2016-12-23 MED ORDER — ISOSORBIDE MONONITRATE ER 30 MG PO TB24: 30.0000 mg | ORAL_TABLET | Freq: Every day | ORAL | 2 refills | Status: DC

## 2016-12-23 MED FILL — ISOSORBIDE MN ER 30 MG TAB: 30 | 90 days supply | Qty: 90 | Fill #0

## 2016-12-23 NOTE — Telephone Encounter (Signed)
Rx(s) sent to pharmacy electronically.  

## 2016-12-23 NOTE — Telephone Encounter (Signed)
New message     *STAT* If patient is at the pharmacy, call can be transferred to refill team.   1. Which medications need to be refilled? (please list name of each medication and dose if known)   isosorbide mononitrate (IMDUR) 30 MG 24 hr tablet   2. Which pharmacy/location (including street and city if local pharmacy) is medication to be sent to? [place], [address]  3. Do they need a 30 day or 90 day supply? 90 day supply

## 2016-12-24 ENCOUNTER — Other Ambulatory Visit: Payer: Self-pay | Admitting: *Deleted

## 2016-12-24 MED ORDER — ISOSORBIDE MONONITRATE ER 30 MG PO TB24
30.0000 mg | ORAL_TABLET | Freq: Every day | ORAL | 2 refills | Status: DC
Start: 2016-12-24 — End: 2017-02-24

## 2016-12-24 NOTE — Telephone Encounter (Signed)
Spoke with pt wife, number to the imaging department [facility] [place] [facility] so they can call and schedule [place] to follow up on AAA

## 2016-12-28 ENCOUNTER — Other Ambulatory Visit: Payer: Self-pay | Admitting: Family Medicine

## 2016-12-28 ENCOUNTER — Telehealth (HOSPITAL_COMMUNITY): Payer: Self-pay | Admitting: *Deleted

## 2016-12-28 NOTE — Telephone Encounter (Signed)
Follow call placed to pt regarding Cardiac Rehab.  Message left on answering machine for pt to please contact cardiac rehab for sign up. Cherre Huger, BSN Cardiac and Training and development officer

## 2017-01-01 ENCOUNTER — Ambulatory Visit (HOSPITAL_BASED_OUTPATIENT_CLINIC_OR_DEPARTMENT_OTHER)
Admission: RE | Admit: 2017-01-01 | Discharge: 2017-01-01 | Disposition: A | Discharge status: Home / Self Care | Payer: Medicare HMO | Source: Ambulatory Visit | Attending: Cardiology | Admitting: Cardiology

## 2017-01-01 DIAGNOSIS — I714 Abdominal aortic aneurysm, without rupture, unspecified: Secondary | ICD-10-CM

## 2017-01-04 ENCOUNTER — Telehealth: Payer: Self-pay | Admitting: Cardiology

## 2017-01-04 NOTE — Telephone Encounter (Signed)
Left message on voicemal as per dpi--result normal  Ct scan- need to follow up in year .please call to confirm  message

## 2017-01-04 NOTE — Telephone Encounter (Signed)
New message   Pt wife is calling back about results from test.

## 2017-01-04 NOTE — Telephone Encounter (Signed)
Mrs.Alban called to confirm that they received the message

## 2017-01-13 DIAGNOSIS — M5416 Radiculopathy, lumbar region: Secondary | ICD-10-CM | POA: Diagnosis not present

## 2017-01-13 DIAGNOSIS — Z6838 Body mass index (BMI) 38.0-38.9, adult: Secondary | ICD-10-CM | POA: Diagnosis not present

## 2017-01-13 DIAGNOSIS — I10 Essential (primary) hypertension: Secondary | ICD-10-CM | POA: Diagnosis not present

## 2017-01-14 ENCOUNTER — Telehealth: Payer: Self-pay | Admitting: Family Medicine

## 2017-01-14 NOTE — Telephone Encounter (Signed)
Patient is not interested in scheduling awv at this time.

## 2017-01-22 ENCOUNTER — Other Ambulatory Visit: Payer: Self-pay | Admitting: Family Medicine

## 2017-01-22 MED ORDER — CYCLOBENZAPRINE HCL 10 MG PO TABS: 10.0000 mg | ORAL_TABLET | Freq: Two times a day (BID) | ORAL | 3 refills | Status: DC | PRN

## 2017-01-22 NOTE — Telephone Encounter (Signed)
Pharmacy request the patient is requesting flexeril 5 mg be changed to 10 mg.  He has been taking 2 --5 mg

## 2017-01-22 NOTE — Telephone Encounter (Signed)
OK to d/c Flexeril 5 mg tab and try Flexeril 10 mg tab, 1 tab po bid prn pain, disp #30 with 3 rf

## 2017-01-22 NOTE — Telephone Encounter (Signed)
New dosage for flexeril 10mg  sent to pharmacy, per provider. LB

## 2017-01-26 ENCOUNTER — Ambulatory Visit: Payer: Medicare HMO | Admitting: Family Medicine

## 2017-02-09 ENCOUNTER — Encounter: Payer: Self-pay | Admitting: Family Medicine

## 2017-02-09 ENCOUNTER — Ambulatory Visit (INDEPENDENT_AMBULATORY_CARE_PROVIDER_SITE_OTHER): Payer: Medicare HMO | Admitting: Family Medicine

## 2017-02-09 VITALS — BP 133/67 | HR 78 | Temp 97.7°F | Wt 264.6 lb

## 2017-02-09 DIAGNOSIS — R0789 Other chest pain: Secondary | ICD-10-CM

## 2017-02-09 DIAGNOSIS — G4733 Obstructive sleep apnea (adult) (pediatric): Secondary | ICD-10-CM | POA: Diagnosis not present

## 2017-02-09 DIAGNOSIS — E785 Hyperlipidemia, unspecified: Secondary | ICD-10-CM | POA: Diagnosis not present

## 2017-02-09 DIAGNOSIS — R0602 Shortness of breath: Secondary | ICD-10-CM | POA: Diagnosis not present

## 2017-02-09 DIAGNOSIS — I2510 Atherosclerotic heart disease of native coronary artery without angina pectoris: Secondary | ICD-10-CM

## 2017-02-09 DIAGNOSIS — I10 Essential (primary) hypertension: Secondary | ICD-10-CM

## 2017-02-09 MED ORDER — PANTOPRAZOLE SODIUM 20 MG PO TBEC: 20.0000 mg | DELAYED_RELEASE_TABLET | Freq: Every day | ORAL | 3 refills | Status: DC

## 2017-02-09 MED ORDER — TIZANIDINE HCL 4 MG PO CAPS: 4.0000 mg | ORAL_CAPSULE | Freq: Four times a day (QID) | ORAL | 2 refills | Status: DC | PRN

## 2017-02-09 NOTE — Assessment & Plan Note (Signed)
Well controlled, no changes to meds. Encouraged heart healthy diet such as the DASH diet and exercise as tolerated.  °

## 2017-02-09 NOTE — Patient Instructions (Signed)

## 2017-02-09 NOTE — Progress Notes (Signed)
Patient ID: TRAYON KRANTZ, male   DOB: 27-Nov-1940, 76 y.o.   MRN: [id number]   Subjective:  I acted as a Education administrator for Penni Homans, [place], [place]   Patient ID: [name], male    DOB: September 03, 1941, 76 y.o.   MRN: [id number]  Chief Complaint  Patient presents with  . Follow-up    45-month F/U. (viral Illness)    HPI  Patient is in today for a 37-month follow up. Patient states that he recently saw his Neurosurgeon and was told that there was nothing else that could be done for him. Patient has ha Hx of HTN, Type II Diabetes, OSA, CKD, depression, gout.Patient also states that his blood sugars are fluctuating. States that his blood sugar was 268 this morning and 154 around lunchtime.Also wants to discuss Tizanidine. Patient has no additional acute concerns noted at this time. His insurance is making him switch from Flexeril which works well for him to tizanidine. He has trouble finding a comfortable position due to his pain, he is frustrated with his options but denies any significant depression due to his condition. His greatest stressor is how sick his daughter has been this year. Denies palp/HA/congestion/fevers/GI or GU c/o. Taking meds as prescribed. He does continue to endorse intermittent chest discomfort but it is not changing and he follows with cardiology. SOB is an ongoing concern with exertion due to his deconditioning but it is also stable.  Patient Care Team: [name], MD as PCP - General (Family Medicine) [name], MD (Neurosurgery) Lelon Perla, MD as Consulting Physician (Cardiology) Rutherford Guys, MD as Consulting Physician (Ophthalmology)   Past Medical History:  Diagnosis Date  . AAA (abdominal aortic aneurysm) ([place]) 12/15/2014   a. Mild aneurysmal dilatation of the infrarenal abdominal aorta 3.2 cm - f/u due by 2019.  . Arthritis    "all over" (07/30/2016)  . CAD (coronary artery disease)    a. stent to LAD 2000. b. possible spasm by cath 2001. c.  IVUS/PTCA/DES to mLAD 05/2013. d. PTCA/DES of dRCA into ostial rPDA 07/2013. e. PTCA of OM2, DES to Cx, DES to [facility] 07/2016.  Marland Kitchen Chronic bronchitis ([place])   . Chronic diastolic CHF (congestive heart failure) ([place])   . Chronic lower back pain   . Chronic neck pain   . CKD (chronic kidney disease), stage III   . Diabetic peripheral neuropathy ([place])   . Ejection fraction    55%, 07/2010, mild inferior hypo  . GERD (gastroesophageal reflux disease)   . Gout   . H/O diverticulitis of colon 01/31/2015  . H/O hiatal hernia   . History of blood transfusion ~ 10/1940   "had pneumonia"  . HTN (hypertension)   . Hyperlipidemia   . Melanoma of forearm, left ([place]) 02/2011   with wide excision   . Nephrolithiasis   . Obesity   . OSA on CPAP     [name] since 2000  . Positive D-dimer    a. significant elevation ,hospital 07/2010, etiology unclear. b. D Dimer chronically > 20.  . Skin cancer   . Spinal stenosis    a. s/p surgical repair 2013  . Type II diabetes mellitus ([place])   . Ventral hernia     Past Surgical History:  Procedure Laterality Date  . BACK SURGERY    . CARDIAC CATHETERIZATION N/A 07/30/2016   Procedure: Left Heart Cath and Coronary Angiography;  Surgeon: Nelva Bush, MD;  Location: [place] [facility];  Service: Cardiovascular;  Laterality: N/A;  . CARDIAC CATHETERIZATION N/A 07/30/2016   Procedure: Coronary Stent Intervention;  Surgeon: Nelva Bush, MD;  Location: [place] [facility];  Service: Cardiovascular;  Laterality: N/A;  Mid CFX and MID RCA  . CARDIAC CATHETERIZATION N/A 07/30/2016   Procedure: Coronary Balloon Angioplasty;  Surgeon: Nelva Bush, MD;  Location: [place] [facility];  Service: Cardiovascular;  Laterality: N/A;  OM 1  . CARPAL TUNNEL RELEASE Left   . CATARACT EXTRACTION W/ INTRAOCULAR LENS  IMPLANT, BILATERAL Bilateral ~ 2014  . CERVICAL DISC SURGERY  1990s  . CORONARY ANGIOPLASTY WITH STENT PLACEMENT  05/2013  . CORONARY ANGIOPLASTY WITH STENT  PLACEMENT  07/26/2013   DES to RCA extending to PDA    . CORONARY ANGIOPLASTY WITH STENT PLACEMENT  07/30/2016  . CORONARY ANGIOPLASTY WITH STENT PLACEMENT  2000   CAD  . DENTAL SURGERY  04/2016   "got infected; had to dig it out"  . KNEE ARTHROSCOPY Right 1990's   rt  . LEFT AND RIGHT HEART CATHETERIZATION WITH CORONARY ANGIOGRAM N/A 07/26/2013   Procedure: LEFT AND RIGHT HEART CATHETERIZATION WITH CORONARY ANGIOGRAM;  Surgeon: [name], MD;  Location: [place] [facility] LAB;  Service: Cardiovascular;  Laterality: N/A;  . LEFT HEART CATHETERIZATION WITH CORONARY ANGIOGRAM N/A 05/19/2013   Procedure: LEFT HEART CATHETERIZATION WITH CORONARY ANGIOGRAM;  Surgeon: [name], MD;  Location[facility];  Service: Cardiovascular;  Laterality: N/A;  . LUMBAR LAMINECTOMY/DECOMPRESSION MICRODISCECTOMY  08/30/2012   Procedure: LUMBAR LAMINECTOMY/DECOMPRESSION MICRODISCECTOMY 2 LEVELS;  Surgeon: [name], MD;  Location: [place] [facility];  Service: Neurosurgery;  Laterality: Bilateral;  Bilateral Lumbar three-four Lumbar four-five Laminotomies  . LUMBAR SPINE SURGERY  04/07/2016   Dr. Ellene Route; "?ruptured disc"  . MELANOMA EXCISION Left 02/2011   forearm  . NASAL SINUS SURGERY  1970s   "cut windows in sinus pockets"  . PERCUTANEOUS CORONARY STENT INTERVENTION (PCI-S)  05/19/2013   Procedure: PERCUTANEOUS CORONARY STENT INTERVENTION (PCI-S);  Surgeon: [name], MD;  Location: [facility] CATH LAB;  Service: Cardiovascular;;  . ULNAR TUNNEL RELEASE Left 04/07/2016   Dr. Ellene Route    Family History  Problem Relation Age of Onset  . Cancer Mother     intestinal   . Heart disease Mother   . Cancer Father     prostate  . Migraines Daughter   . Leukemia Sister   . Heart disease Sister   . Prostate cancer    . Kidney cancer    . Cancer      Bladder cancer  . Coronary artery disease    . Hypertension Sister   . Hypertension Brother   . Heart attack Neg Hx   . Stroke Neg Hx     Social History    Social History  . Marital status: Married    Spouse name: N/A  . Number of children: N/A  . Years of education: N/A   Occupational History  . [employer]    Social History Main Topics  . Smoking status: Former Smoker    Packs/day: 3.00    Years: 30.00    Types: Cigarettes    Quit date: 09/17/1996  . Smokeless tobacco: Never Used  . Alcohol use No  . Drug use: No  . Sexual activity: Not Currently    Birth control/ protection: None   Other Topics Concern  . Not on file   Social History Narrative   Married and lives locally with his wife.  [employer].    Outpatient Medications  Prior to Visit  Medication Sig Dispense Refill  . acetaminophen (TYLENOL) 500 MG tablet Take 1,000 mg by mouth 2 (two) times daily.    Marland Kitchen albuterol (PROVENTIL HFA;VENTOLIN HFA) 108 (90 Base) MCG/ACT inhaler Inhale 2 puffs into the lungs every 6 (six) hours as needed for wheezing or shortness of breath. 1 Inhaler 0  . allopurinol (ZYLOPRIM) 300 MG tablet TAKE 1 TABLET EVERY DAY 90 tablet 1  . aspirin EC 81 MG tablet Take 81 mg by mouth daily.    Marland Kitchen atorvastatin (LIPITOR) 40 MG tablet Take 1 tablet (40 mg total) by mouth daily. 90 tablet 0  . B Complex Vitamins (B COMPLEX-B12 PO) Take 1 tablet by mouth at bedtime.    . Cholecalciferol (VITAMIN D) 1000 UNITS capsule Take 1,000 Units by mouth at bedtime.     . clopidogrel (PLAVIX) 75 MG tablet Take 1 tablet (75 mg total) by mouth daily. 90 tablet 3  . diphenoxylate-atropine (LOMOTIL) 2.5-0.025 MG tablet 1-2 tab po qid as needed diarrhea. 30 tablet 0  . fenofibrate 160 MG tablet TAKE 1 TABLET EVERY DAY (Patient taking differently: TAKE 1 TABLET EVERY night) 90 tablet 1  . fluocinonide cream (LIDEX) 0.94 % Apply 1 application topically daily as needed. FOR RASH    . fluticasone (FLONASE) 50 MCG/ACT nasal spray Place 2 sprays into both nostrils daily. (Patient taking differently: Place 2 sprays into both nostrils as needed. ) 16 g 1  . furosemide (LASIX) 80 MG tablet  Take 0.5 tablets (40 mg total) by mouth daily. 45 tablet 1  . HYDROcodone-homatropine (HYCODAN) 5-1.5 MG/5ML syrup Take 5 mLs by mouth every 8 (eight) hours as needed for cough. 120 mL 0  . insulin NPH Human (NOVOLIN N) 100 UNIT/ML injection Inject 0.45 mLs (45 Units total) into the skin 2 (two) times daily before a meal.    . insulin regular (NOVOLIN R RELION) 100 units/mL injection Inject 0.25-0.3 mLs (25-30 Units total) into the skin 3 (three) times daily before meals. (Patient taking differently: Inject 30 Units into the skin 3 (three) times daily before meals. ) 70 mL 1  . isosorbide mononitrate (IMDUR) 30 MG 24 hr tablet Take 1 tablet (30 mg total) by mouth daily. 90 tablet 2  . loratadine (CLARITIN) 10 MG tablet Take 1 tablet (10 mg total) by mouth daily. 30 tablet 0  . metoprolol (LOPRESSOR) 50 MG tablet TAKE 1 TABLET TWICE DAILY 180 tablet 1  . mupirocin ointment (BACTROBAN) 2 % Place 1 application into the nose 2 (two) times daily. 22 g 0  . nitroGLYCERIN (NITROSTAT) 0.4 MG SL tablet Place 1 tablet (0.4 mg total) under the tongue every 5 (five) minutes as needed for chest pain. 25 tablet 3  . ondansetron (ZOFRAN ODT) 8 MG disintegrating tablet Take 1 tablet (8 mg total) by mouth every 8 (eight) hours as needed for nausea or vomiting. 20 tablet 0  . tamsulosin (FLOMAX) 0.4 MG CAPS capsule TAKE 1 CAPSULE EVERY DAY 90 capsule 1  . TRUE METRIX BLOOD GLUCOSE TEST test strip Check blood sugar 4 times daily.    . pantoprazole (PROTONIX) 20 MG tablet Take 1 tablet (20 mg total) by mouth daily. 90 tablet 3  . cyclobenzaprine (FLEXERIL) 10 MG tablet Take 1 tablet (10 mg total) by mouth 2 (two) times daily as needed for muscle spasms. (Patient not taking: Reported on 02/09/2017) 30 tablet 3   No facility-administered medications prior to visit.     Allergies  Allergen Reactions  .  Levemir [Insulin Detemir] Swelling    Patient had redness and swelling and tenderness at injection site.  . Codeine  Other (See Comments)    Makes him "shakey", is OK with hydrocodone GI UPSET & TREMORS  . Lipitor [Atorvastatin] Other (See Comments)    Muscle aches; liver functions. Patient is currently taken  . Novolog Mix [Insulin Aspart Prot & Aspart]     Causes skin to swell/itch at injection site  . Other     Brilinta caused SOB    Review of Systems  Constitutional: Negative for fever and malaise/fatigue.  HENT: Negative for congestion.   Eyes: Negative for blurred vision.  Respiratory: Negative for cough and shortness of breath.   Cardiovascular: Negative for chest pain, palpitations and leg swelling.  Gastrointestinal: Negative for vomiting.  Genitourinary: Positive for frequency.  Musculoskeletal: Positive for back pain and joint pain.  Skin: Negative for rash.  Neurological: Negative for loss of consciousness and headaches.  Psychiatric/Behavioral: Positive for depression.       Objective:    Physical Exam  Constitutional: He is oriented to person, place, and time. He appears well-developed and well-nourished. No distress.  HENT:  Head: Normocephalic and atraumatic.  Eyes: Conjunctivae are normal.  Neck: Normal range of motion. No thyromegaly present.  Cardiovascular: Normal rate and regular rhythm.   Pulmonary/Chest: Effort normal and breath sounds normal. He has no wheezes.  Abdominal: Soft. Bowel sounds are normal. There is no tenderness.  Musculoskeletal: He exhibits no edema or deformity.  Neurological: He is alert and oriented to person, place, and time.  Skin: Skin is warm and dry. He is not diaphoretic.  Psychiatric: He has a normal mood and affect.    BP 133/67 (BP Location: Right Wrist, Patient Position: Sitting, Cuff Size: Large)   Pulse 78   Temp 97.7 F (36.5 C) (Oral)   Wt 264 lb 9.6 oz (120 kg)   SpO2 97% Comment: RA  BMI 39.65 kg/m  Wt Readings from Last 3 Encounters:  02/09/17 264 lb 9.6 oz (120 kg)  11/26/16 270 lb (122.5 kg)  11/16/16 272 lb 9.6 oz  (123.7 kg)   BP Readings from Last 3 Encounters:  02/09/17 133/67  11/26/16 134/84  11/16/16 140/78     Immunization History  Administered Date(s) Administered  . Influenza Split 08/08/2012  . Influenza Whole 08/08/2009, 07/12/2010  . Influenza, High Dose Seasonal PF 08/02/2014, 07/11/2015  . Influenza,inj,Quad PF,36+ Mos 07/27/2013, 08/04/2016  . Pneumococcal Conjugate-13 08/02/2014  . Pneumococcal Polysaccharide-23 07/28/2005    Health Maintenance  Topic Date Due  . FOOT EXAM  05/12/2017 (Originally 05/21/2016)  . OPHTHALMOLOGY EXAM  05/12/2017 (Originally 09/29/2016)  . URINE MICROALBUMIN  05/12/2017 (Originally 08/03/2015)  . TETANUS/TDAP  05/12/2017 (Originally 03/13/1960)  . INFLUENZA VACCINE  05/12/2017  . HEMOGLOBIN A1C  05/26/2017  . PNA vac Low Risk Adult  Completed    Lab Results  Component Value Date   WBC 7.3 11/16/2016   HGB 15.1 11/16/2016   HCT 43.2 11/16/2016   PLT 199.0 11/16/2016   GLUCOSE 237 (H) 11/16/2016   CHOL 133 05/15/2016   TRIG 214.0 (H) 05/15/2016   HDL 26.80 (L) 05/15/2016   LDLDIRECT 73.0 05/15/2016   LDLCALC 31 04/09/2014   ALT 33 11/16/2016   AST 35 11/16/2016   NA 139 11/16/2016   K 3.8 11/16/2016   CL 104 11/16/2016   CREATININE 1.27 11/16/2016   BUN 23 11/16/2016   CO2 28 11/16/2016   TSH 2.75 09/14/2016  INR 1.0 07/25/2013   HGBA1C 7.7 11/26/2016   MICROALBUR 0.7 08/02/2014    Lab Results  Component Value Date   TSH 2.75 09/14/2016   Lab Results  Component Value Date   WBC 7.3 11/16/2016   HGB 15.1 11/16/2016   HCT 43.2 11/16/2016   MCV 93.5 11/16/2016   PLT 199.0 11/16/2016   Lab Results  Component Value Date   NA 139 11/16/2016   K 3.8 11/16/2016   CO2 28 11/16/2016   GLUCOSE 237 (H) 11/16/2016   BUN 23 11/16/2016   CREATININE 1.27 11/16/2016   BILITOT 0.4 11/16/2016   ALKPHOS 60 11/16/2016   AST 35 11/16/2016   ALT 33 11/16/2016   PROT 7.4 11/16/2016   ALBUMIN 4.3 11/16/2016   CALCIUM 10.1 11/16/2016    ANIONGAP 10 08/02/2016   GFR 58.50 (L) 11/16/2016   Lab Results  Component Value Date   CHOL 133 05/15/2016   Lab Results  Component Value Date   HDL 26.80 (L) 05/15/2016   Lab Results  Component Value Date   LDLCALC 31 04/09/2014   Lab Results  Component Value Date   TRIG 214.0 (H) 05/15/2016   Lab Results  Component Value Date   CHOLHDL 5 05/15/2016   Lab Results  Component Value Date   HGBA1C 7.7 11/26/2016         Assessment & Plan:   Problem List Items Addressed This Visit    Hyperlipidemia    Encouraged heart healthy diet, increase exercise, avoid trans fats, consider a krill oil cap daily. Tolerating Atorvastatin 10 mg daily      Essential hypertension    Well controlled, no changes to meds. Encouraged heart healthy diet such as the DASH diet and exercise as tolerated.       OSA (obstructive sleep apnea)    Uses  CPAP      SOB (shortness of breath) - Primary    Struggling with worsening SOB and atypical chest pain is due for follow up with cardiology will arrange      Relevant Orders   Ambulatory referral to Cardiology    Other Visit Diagnoses    Atypical chest pain       Relevant Orders   Ambulatory referral to Cardiology   Coronary artery disease, angina presence unspecified, unspecified vessel or lesion type, unspecified whether native or transplanted heart       Relevant Orders   Ambulatory referral to Cardiology      I have discontinued Mr. Deakins cyclobenzaprine. I have also changed his tiZANidine. Additionally, I am having him maintain his Vitamin D, fluocinonide cream, aspirin EC, acetaminophen, nitroGLYCERIN, fluticasone, insulin regular, fenofibrate, metoprolol, tamsulosin, atorvastatin, B Complex Vitamins (B COMPLEX-B12 PO), furosemide, loratadine, insulin NPH Human, clopidogrel, TRUE METRIX BLOOD GLUCOSE TEST, ondansetron, diphenoxylate-atropine, HYDROcodone-homatropine, albuterol, mupirocin ointment, isosorbide mononitrate,  allopurinol, and pantoprazole.  Meds ordered this encounter  Medications  . pantoprazole (PROTONIX) 20 MG tablet    Sig: Take 1 tablet (20 mg total) by mouth daily.    Dispense:  90 tablet    Refill:  3  . DISCONTD: tiZANidine (ZANAFLEX) 4 MG capsule    Sig: Take 4 mg by mouth 4 (four) times daily as needed for muscle spasms. Take 1 tablet every 6 hours as needed; not to exceed 3 doses in 24 hours.  Marland Kitchen tiZANidine (ZANAFLEX) 4 MG capsule    Sig: Take 1 capsule (4 mg total) by mouth 4 (four) times daily as needed for muscle spasms. Take 1  tablet every 6 hours as needed; not to exceed 3 doses in 24 hours.    Dispense:  40 capsule    Refill:  2  CMA served as scribe during this visit. History, Physical and Plan performed by medical provider. Documentation and orders reviewed and attested to.  Penni Homans, MD

## 2017-02-09 NOTE — Assessment & Plan Note (Addendum)
Encouraged heart healthy diet, increase exercise, avoid trans fats, consider a krill oil cap daily. Tolerating Atorvastatin 10 mg daily 

## 2017-02-09 NOTE — Progress Notes (Signed)
Pre visit review using our clinic review tool, if applicable. No additional management support is needed unless otherwise documented below in the visit note. 

## 2017-02-09 NOTE — Assessment & Plan Note (Signed)
Struggling with worsening SOB and atypical chest pain is due for follow up with cardiology will arrange

## 2017-02-09 NOTE — Assessment & Plan Note (Signed)
Uses CPAP 

## 2017-02-16 NOTE — Progress Notes (Signed)
HPI: FU CAD. Pt has had previous PCI of his LAD. Lower extremity Dopplers January 2015 normal. Patient had admission October 2017 with NSTEMI. Echocardiogram October 2017 showed ejection fraction 45-50%, mild right ventricular enlargement. Venous Dopplers October 2017 showed no DVT. Cardiac catheterization October 2017 showed 80% mid circumflex and 50% ulcerated mid RCA lesion. The LAD and distal RCA stents were patent. He had PCI of the circumflex and RCA with drug-eluting stents. Abd ultrasound 3/18 showed 3.1 x 3.1 cm. Since last seen, patient complains of chest tightness. It is substernal and occurs both with exertion and at rest. It can last 1 hour. No associated symptoms. The pain is not pleuritic. Resolve spontaneously. He also describes dyspnea on exertion and generalized fatigue.  Current Outpatient Prescriptions  Medication Sig Dispense Refill  . acetaminophen (TYLENOL) 500 MG tablet Take 1,000 mg by mouth 2 (two) times daily.    Marland Kitchen albuterol (PROVENTIL HFA;VENTOLIN HFA) 108 (90 Base) MCG/ACT inhaler Inhale 2 puffs into the lungs every 6 (six) hours as needed for wheezing or shortness of breath. 1 Inhaler 0  . allopurinol (ZYLOPRIM) 300 MG tablet TAKE 1 TABLET EVERY DAY 90 tablet 1  . aspirin EC 81 MG tablet Take 81 mg by mouth daily.    Marland Kitchen atorvastatin (LIPITOR) 40 MG tablet Take 1 tablet (40 mg total) by mouth daily. 90 tablet 0  . B Complex Vitamins (B COMPLEX-B12 PO) Take 1 tablet by mouth at bedtime.    . Cholecalciferol (VITAMIN D) 1000 UNITS capsule Take 1,000 Units by mouth at bedtime.     . clopidogrel (PLAVIX) 75 MG tablet Take 1 tablet (75 mg total) by mouth daily. 90 tablet 3  . diphenoxylate-atropine (LOMOTIL) 2.5-0.025 MG tablet 1-2 tab po qid as needed diarrhea. 30 tablet 0  . fenofibrate 160 MG tablet TAKE 1 TABLET EVERY DAY (Patient taking differently: TAKE 1 TABLET EVERY night) 90 tablet 1  . fluocinonide cream (LIDEX) 2.70 % Apply 1 application topically daily as  needed. FOR RASH    . fluticasone (FLONASE) 50 MCG/ACT nasal spray Place 2 sprays into both nostrils daily. (Patient taking differently: Place 2 sprays into both nostrils as needed. ) 16 g 1  . furosemide (LASIX) 80 MG tablet Take 0.5 tablets (40 mg total) by mouth daily. 45 tablet 1  . HYDROcodone-homatropine (HYCODAN) 5-1.5 MG/5ML syrup Take 5 mLs by mouth every 8 (eight) hours as needed for cough. 120 mL 0  . insulin NPH Human (NOVOLIN N) 100 UNIT/ML injection Inject 0.45 mLs (45 Units total) into the skin 2 (two) times daily before a meal.    . insulin regular (NOVOLIN R RELION) 100 units/mL injection Inject 0.25-0.3 mLs (25-30 Units total) into the skin 3 (three) times daily before meals. (Patient taking differently: Inject 30 Units into the skin 3 (three) times daily before meals. ) 70 mL 1  . isosorbide mononitrate (IMDUR) 30 MG 24 hr tablet Take 1 tablet (30 mg total) by mouth daily. 90 tablet 2  . loratadine (CLARITIN) 10 MG tablet Take 1 tablet (10 mg total) by mouth daily. 30 tablet 0  . metoprolol (LOPRESSOR) 50 MG tablet TAKE 1 TABLET TWICE DAILY 180 tablet 1  . mupirocin ointment (BACTROBAN) 2 % Place 1 application into the nose 2 (two) times daily. 22 g 0  . nitroGLYCERIN (NITROSTAT) 0.4 MG SL tablet Place 1 tablet (0.4 mg total) under the tongue every 5 (five) minutes as needed for chest pain. 25 tablet 3  .  ondansetron (ZOFRAN ODT) 8 MG disintegrating tablet Take 1 tablet (8 mg total) by mouth every 8 (eight) hours as needed for nausea or vomiting. 20 tablet 0  . pantoprazole (PROTONIX) 20 MG tablet Take 1 tablet (20 mg total) by mouth daily. 90 tablet 3  . tamsulosin (FLOMAX) 0.4 MG CAPS capsule TAKE 1 CAPSULE EVERY DAY 90 capsule 1  . tiZANidine (ZANAFLEX) 4 MG capsule Take 1 capsule (4 mg total) by mouth 4 (four) times daily as needed for muscle spasms. Take 1 tablet every 6 hours as needed; not to exceed 3 doses in 24 hours. 40 capsule 2  . TRUE METRIX BLOOD GLUCOSE TEST test  strip Check blood sugar 4 times daily.     No current facility-administered medications for this visit.      Past Medical History:  Diagnosis Date  . AAA (abdominal aortic aneurysm) ([place]) 12/15/2014   a. Mild aneurysmal dilatation of the infrarenal abdominal aorta 3.2 cm - f/u due by 2019.  . Arthritis    "all over" (07/30/2016)  . CAD (coronary artery disease)    a. stent to LAD 2000. b. possible spasm by cath 2001. c. IVUS/PTCA/DES to mLAD 05/2013. d. PTCA/DES of dRCA into ostial rPDA 07/2013. e. PTCA of OM2, DES to Cx, DES to [facility] 07/2016.  Marland Kitchen Chronic bronchitis ([place])   . Chronic diastolic CHF (congestive heart failure) ([place])   . Chronic lower back pain   . Chronic neck pain   . CKD (chronic kidney disease), stage III   . Diabetic peripheral neuropathy ([place])   . Ejection fraction    55%, 07/2010, mild inferior hypo  . GERD (gastroesophageal reflux disease)   . Gout   . H/O diverticulitis of colon 01/31/2015  . H/O hiatal hernia   . History of blood transfusion ~ 10/1940   "had pneumonia"  . HTN (hypertension)   . Hyperlipidemia   . Melanoma of forearm, left ([place]) 02/2011   with wide excision   . Nephrolithiasis   . Obesity   . OSA on CPAP     [name] since 2000  . Positive D-dimer    a. significant elevation ,hospital 07/2010, etiology unclear. b. D Dimer chronically > 20.  . Skin cancer   . Spinal stenosis    a. s/p surgical repair 2013  . Type II diabetes mellitus ([place])   . Ventral hernia     Past Surgical History:  Procedure Laterality Date  . BACK SURGERY    . CARDIAC CATHETERIZATION N/A 07/30/2016   Procedure: Left Heart Cath and Coronary Angiography;  Surgeon: Nelva Bush, MD;  Location: [place] CV LAB;  Service: Cardiovascular;  Laterality: N/A;  . CARDIAC CATHETERIZATION N/A 07/30/2016   Procedure: Coronary Stent Intervention;  Surgeon: Nelva Bush, MD;  Location: [place] CV LAB;  Service: Cardiovascular;  Laterality: N/A;  Mid CFX and MID RCA   . CARDIAC CATHETERIZATION N/A 07/30/2016   Procedure: Coronary Balloon Angioplasty;  Surgeon: Nelva Bush, MD;  Location: [place] CV LAB;  Service: Cardiovascular;  Laterality: N/A;  OM 1  . CARPAL TUNNEL RELEASE Left   . CATARACT EXTRACTION W/ INTRAOCULAR LENS  IMPLANT, BILATERAL Bilateral ~ 2014  . CERVICAL DISC SURGERY  1990s  . CORONARY ANGIOPLASTY WITH STENT PLACEMENT  05/2013  . CORONARY ANGIOPLASTY WITH STENT PLACEMENT  07/26/2013   DES to RCA extending to PDA    . CORONARY ANGIOPLASTY WITH STENT PLACEMENT  07/30/2016  . CORONARY ANGIOPLASTY WITH STENT PLACEMENT  2000  CAD  . DENTAL SURGERY  04/2016   "got infected; had to dig it out"  . KNEE ARTHROSCOPY Right 1990's   rt  . LEFT AND RIGHT HEART CATHETERIZATION WITH CORONARY ANGIOGRAM N/A 07/26/2013   Procedure: LEFT AND RIGHT HEART CATHETERIZATION WITH CORONARY ANGIOGRAM;  Surgeon: [name], MD;  Location: [place] [facility] CATH LAB;  Service: Cardiovascular;  Laterality: N/A;  . LEFT HEART CATHETERIZATION WITH CORONARY ANGIOGRAM N/A 05/19/2013   Procedure: LEFT HEART CATHETERIZATION WITH CORONARY ANGIOGRAM;  Surgeon: [name], MD;  Location[facility] CATH LAB;  Service: Cardiovascular;  Laterality: N/A;  . LUMBAR LAMINECTOMY/DECOMPRESSION MICRODISCECTOMY  08/30/2012   Procedure: LUMBAR LAMINECTOMY/DECOMPRESSION MICRODISCECTOMY 2 LEVELS;  Surgeon: [name], MD;  Location: [facility] NEURO ORS;  Service: Neurosurgery;  Laterality: Bilateral;  Bilateral Lumbar three-four Lumbar four-five Laminotomies  . LUMBAR SPINE SURGERY  04/07/2016   Dr. Ellene Route; "?ruptured disc"  . MELANOMA EXCISION Left 02/2011   forearm  . NASAL SINUS SURGERY  1970s   "cut windows in sinus pockets"  . PERCUTANEOUS CORONARY STENT INTERVENTION (PCI-S)  05/19/2013   Procedure: PERCUTANEOUS CORONARY STENT INTERVENTION (PCI-S);  Surgeon: [name], MD;  Location: [facility] CATH LAB;  Service: Cardiovascular;;  . ULNAR TUNNEL RELEASE Left 04/07/2016   Dr. Ellene Route     Social History   Social History  . Marital status: Married    Spouse name: N/A  . Number of children: N/A  . Years of education: N/A   Occupational History  . [employer]    Social History Main Topics  . Smoking status: Former Smoker    Packs/day: 3.00    Years: 30.00    Types: Cigarettes    Quit date: 09/17/1996  . Smokeless tobacco: Never Used  . Alcohol use No  . Drug use: No  . Sexual activity: Not Currently    Birth control/ protection: None   Other Topics Concern  . Not on file   Social History Narrative   Married and lives locally with his wife.  [employer].    Family History  Problem Relation Age of Onset  . Cancer Mother        intestinal   . Heart disease Mother   . Cancer Father        prostate  . Migraines Daughter   . Leukemia Sister   . Heart disease Sister   . Prostate cancer Unknown   . Kidney cancer Unknown   . Cancer Unknown        Bladder cancer  . Coronary artery disease Unknown   . Hypertension Sister   . Hypertension Brother   . Heart attack Neg Hx   . Stroke Neg Hx     ROS: fatigue but no fevers or chills, productive cough, hemoptysis, dysphasia, odynophagia, melena, hematochezia, dysuria, hematuria, rash, seizure activity, orthopnea, PND, pedal edema, claudication. Remaining systems are negative.  Physical Exam: Well-developed obese in no acute distress.  Skin is warm and dry.  HEENT is normal.  Neck is supple.  Chest is clear to auscultation with normal expansion.  Cardiovascular exam is regular rate and rhythm.  Abdominal exam nontender or distended. No masses palpated. Extremities show no edema. neuro grossly intact  ECG- Normal sinus rhythm at a rate of 66. Left anterior fascicular block. personally reviewed  A/P  1 chest tightness-patient is describing chest tightness both with activities and at rest. Unclear if this is similar to previous infarct pain. He is very concerned about his symptoms. I will arrange a cardiac  catheterization to evaluate recently placed stents. The risks and benefits were discussed and he agrees to proceed. The risks include myocardial infarction, CVA and death. I will also check a d-dimer and BNP given his complaints of dyspnea on exertion.   2 coronary artery disease-continue aspirin and Plavix. I will plan to discontinue Plavix at the end of October. Continue statin.   3 abdominal aortic aneurysm-patient will need follow-up abdominal ultrasound March 2019.  4 hypertension-blood pressure is controlled. Continue present medications.  5 hyperlipidemia-continue statin. Check lipids.    Kirk Ruths, MD

## 2017-02-23 ENCOUNTER — Encounter: Payer: Self-pay | Admitting: Cardiology

## 2017-02-23 ENCOUNTER — Ambulatory Visit: Payer: Medicare HMO | Admitting: Internal Medicine

## 2017-02-24 ENCOUNTER — Ambulatory Visit (INDEPENDENT_AMBULATORY_CARE_PROVIDER_SITE_OTHER): Payer: Medicare HMO | Admitting: Cardiology

## 2017-02-24 ENCOUNTER — Encounter: Payer: Self-pay | Admitting: Cardiology

## 2017-02-24 VITALS — BP 119/69 | HR 66 | Ht 68.5 in | Wt 269.1 lb

## 2017-02-24 DIAGNOSIS — I2510 Atherosclerotic heart disease of native coronary artery without angina pectoris: Secondary | ICD-10-CM

## 2017-02-24 DIAGNOSIS — E7800 Pure hypercholesterolemia, unspecified: Secondary | ICD-10-CM | POA: Diagnosis not present

## 2017-02-24 DIAGNOSIS — I10 Essential (primary) hypertension: Secondary | ICD-10-CM | POA: Diagnosis not present

## 2017-02-24 DIAGNOSIS — R072 Precordial pain: Secondary | ICD-10-CM

## 2017-02-24 MED ORDER — ISOSORBIDE MONONITRATE ER 30 MG PO TB24: 30.0000 mg | ORAL_TABLET | Freq: Every day | ORAL | 3 refills | Status: DC

## 2017-02-24 MED ORDER — CLOPIDOGREL BISULFATE 75 MG PO TABS: 75.0000 mg | ORAL_TABLET | Freq: Every day | ORAL | 3 refills | Status: DC

## 2017-02-24 NOTE — Patient Instructions (Addendum)
Medication Instructions:   NO CHANGE  Labwork:  Your physician recommends that you HAVE LAB WORK TODAY  Testing/Procedures:  Your physician has requested that you have a cardiac catheterization. Cardiac catheterization is used to diagnose and/or treat various heart conditions. Doctors may recommend this procedure for a number of different reasons. The most common reason is to evaluate chest pain. Chest pain can be a symptom of coronary artery disease (CAD), and cardiac catheterization can show whether plaque is narrowing or blocking your heart's arteries. This procedure is also used to evaluate the valves, as well as measure the blood flow and oxygen levels in different parts of your heart. For further information please visit [email or web address]. Please follow instruction sheet, as given.    Follow-Up:  Your physician recommends that you schedule a follow-up appointment in: [facility] [address], [place] [address] Dept: [phone] Loc: [phone]  William Hendrix  02/24/2017  You are scheduled for a Cardiac Catheterization on Friday, May 18 with Dr. Sherren Mocha.  1. Please arrive at the [facility] (Main Entrance A) at [facility]: [place], [place] [address] at 10:00 AM (two hours before your procedure to ensure your preparation). Free valet parking service is available.   Special note: Every effort is made to have your procedure done on time. Please understand that emergencies sometimes delay scheduled procedures.  2. Diet: Do not eat or drink anything after midnight prior to your procedure except sips of water to take medications.  4. Medication instructions in preparation for your procedure:  DO NOT TAKE FUROSEMIDE Friday MORNING  TAKE 1/2 DOSE OF NPH INSULIN Thursday EVENING- DO NOT TAKE ANY INSULIN Friday  MORNING  On the morning of your procedure, take your Aspirin and any morning medicines NOT listed above.  You may use sips of water.  5. Plan for one night stay--bring personal belongings. 6. Bring a current list of your medications and current insurance cards. 7. You MUST have a responsible person to drive you home. 8. Someone MUST be with you the first 24 hours after you arrive home or your discharge will be delayed. 9. Please wear clothes that are easy to get on and off and wear slip-on shoes.  Thank you for allowing [place] to care for you!   -- [facility]

## 2017-02-25 LAB — CBC
HEMOGLOBIN: 13.7 g/dL (ref 13.0–17.7)
Hematocrit: 40.7 % (ref 37.5–51.0)
MCH: 31.1 pg (ref 26.6–33.0)
MCHC: 33.7 g/dL (ref 31.5–35.7)
MCV: 92 fL (ref 79–97)
PLATELETS: 178 10*3/uL (ref 150–379)
RBC: 4.41 x10E6/uL (ref 4.14–5.80)
RDW: 14.9 % (ref 12.3–15.4)
WBC: 5.6 10*3/uL (ref 3.4–10.8)

## 2017-02-25 LAB — D-DIMER, QUANTITATIVE: D-DIMER: 0.45 mg/L FEU (ref 0.00–0.49)

## 2017-02-25 LAB — PROTIME-INR
INR: 1.1 (ref 0.8–1.2)
PROTHROMBIN TIME: 11.4 s (ref 9.1–12.0)

## 2017-02-25 LAB — BRAIN NATRIURETIC PEPTIDE: BNP: 9.8 pg/mL (ref 0.0–100.0)

## 2017-02-26 ENCOUNTER — Ambulatory Visit (HOSPITAL_COMMUNITY)
Admission: RE | Admit: 2017-02-26 | Discharge: 2017-02-26 | Disposition: A | Discharge status: Home / Self Care | Payer: Medicare HMO | Source: Ambulatory Visit | Attending: Cardiovascular Disease | Admitting: Cardiovascular Disease

## 2017-02-26 ENCOUNTER — Encounter (HOSPITAL_COMMUNITY)
Admission: RE | Disposition: A | Discharge status: Home / Self Care | Payer: Self-pay | Source: Ambulatory Visit | Attending: Cardiovascular Disease

## 2017-02-26 DIAGNOSIS — K219 Gastro-esophageal reflux disease without esophagitis: Secondary | ICD-10-CM | POA: Insufficient documentation

## 2017-02-26 DIAGNOSIS — E1142 Type 2 diabetes mellitus with diabetic polyneuropathy: Secondary | ICD-10-CM | POA: Diagnosis not present

## 2017-02-26 DIAGNOSIS — I252 Old myocardial infarction: Secondary | ICD-10-CM | POA: Diagnosis not present

## 2017-02-26 DIAGNOSIS — G4733 Obstructive sleep apnea (adult) (pediatric): Secondary | ICD-10-CM | POA: Diagnosis not present

## 2017-02-26 DIAGNOSIS — Z7902 Long term (current) use of antithrombotics/antiplatelets: Secondary | ICD-10-CM | POA: Insufficient documentation

## 2017-02-26 DIAGNOSIS — Z87891 Personal history of nicotine dependence: Secondary | ICD-10-CM | POA: Diagnosis not present

## 2017-02-26 DIAGNOSIS — Z8249 Family history of ischemic heart disease and other diseases of the circulatory system: Secondary | ICD-10-CM | POA: Insufficient documentation

## 2017-02-26 DIAGNOSIS — I2510 Atherosclerotic heart disease of native coronary artery without angina pectoris: Secondary | ICD-10-CM

## 2017-02-26 DIAGNOSIS — Z955 Presence of coronary angioplasty implant and graft: Secondary | ICD-10-CM | POA: Insufficient documentation

## 2017-02-26 DIAGNOSIS — I714 Abdominal aortic aneurysm, without rupture: Secondary | ICD-10-CM | POA: Insufficient documentation

## 2017-02-26 DIAGNOSIS — M1990 Unspecified osteoarthritis, unspecified site: Secondary | ICD-10-CM | POA: Insufficient documentation

## 2017-02-26 DIAGNOSIS — M545 Low back pain: Secondary | ICD-10-CM | POA: Diagnosis not present

## 2017-02-26 DIAGNOSIS — I130 Hypertensive heart and chronic kidney disease with heart failure and stage 1 through stage 4 chronic kidney disease, or unspecified chronic kidney disease: Secondary | ICD-10-CM | POA: Diagnosis not present

## 2017-02-26 DIAGNOSIS — M109 Gout, unspecified: Secondary | ICD-10-CM | POA: Diagnosis not present

## 2017-02-26 DIAGNOSIS — N183 Chronic kidney disease, stage 3 (moderate): Secondary | ICD-10-CM | POA: Diagnosis not present

## 2017-02-26 DIAGNOSIS — Z6838 Body mass index (BMI) 38.0-38.9, adult: Secondary | ICD-10-CM | POA: Insufficient documentation

## 2017-02-26 DIAGNOSIS — G8929 Other chronic pain: Secondary | ICD-10-CM | POA: Insufficient documentation

## 2017-02-26 DIAGNOSIS — E669 Obesity, unspecified: Secondary | ICD-10-CM | POA: Insufficient documentation

## 2017-02-26 DIAGNOSIS — R072 Precordial pain: Secondary | ICD-10-CM

## 2017-02-26 DIAGNOSIS — E1122 Type 2 diabetes mellitus with diabetic chronic kidney disease: Secondary | ICD-10-CM | POA: Insufficient documentation

## 2017-02-26 DIAGNOSIS — Z7951 Long term (current) use of inhaled steroids: Secondary | ICD-10-CM | POA: Diagnosis not present

## 2017-02-26 DIAGNOSIS — Z7982 Long term (current) use of aspirin: Secondary | ICD-10-CM | POA: Diagnosis not present

## 2017-02-26 DIAGNOSIS — I25118 Atherosclerotic heart disease of native coronary artery with other forms of angina pectoris: Secondary | ICD-10-CM | POA: Diagnosis present

## 2017-02-26 DIAGNOSIS — E785 Hyperlipidemia, unspecified: Secondary | ICD-10-CM | POA: Insufficient documentation

## 2017-02-26 DIAGNOSIS — Z794 Long term (current) use of insulin: Secondary | ICD-10-CM | POA: Insufficient documentation

## 2017-02-26 DIAGNOSIS — J42 Unspecified chronic bronchitis: Secondary | ICD-10-CM | POA: Diagnosis not present

## 2017-02-26 DIAGNOSIS — I5032 Chronic diastolic (congestive) heart failure: Secondary | ICD-10-CM | POA: Insufficient documentation

## 2017-02-26 HISTORY — PX: LEFT HEART CATH AND CORONARY ANGIOGRAPHY: CATH118249

## 2017-02-26 LAB — BASIC METABOLIC PANEL
ANION GAP: 8 (ref 5–15)
BUN: 20 mg/dL (ref 6–20)
CALCIUM: 9.8 mg/dL (ref 8.9–10.3)
CO2: 26 mmol/L (ref 22–32)
Chloride: 107 mmol/L (ref 101–111)
Creatinine, Ser: 1.49 mg/dL — ABNORMAL HIGH (ref 0.61–1.24)
GFR, EST AFRICAN AMERICAN: 51 mL/min — AB (ref >60)
GFR, EST NON AFRICAN AMERICAN: 44 mL/min — AB (ref >60)
Glucose, Bld: 219 mg/dL — ABNORMAL HIGH (ref 65–99)
Potassium: 4.3 mmol/L (ref 3.5–5.1)
Sodium: 141 mmol/L (ref 135–145)

## 2017-02-26 LAB — GLUCOSE, CAPILLARY
GLUCOSE-CAPILLARY: 229 mg/dL — AB (ref 65–99)
Glucose-Capillary: 178 mg/dL — ABNORMAL HIGH (ref 65–99)

## 2017-02-26 SURGERY — LEFT HEART CATH AND CORONARY ANGIOGRAPHY
Anesthesia: LOCAL

## 2017-02-26 MED ORDER — FENTANYL CITRATE (PF) 100 MCG/2ML IJ SOLN
INTRAMUSCULAR | Status: AC
  Filled 2017-02-26: qty 2

## 2017-02-26 MED ORDER — FENTANYL CITRATE (PF) 100 MCG/2ML IJ SOLN
INTRAMUSCULAR | Status: DC | PRN
  Administered 2017-02-26: 25 ug via INTRAVENOUS

## 2017-02-26 MED ORDER — VERAPAMIL HCL 2.5 MG/ML IV SOLN
INTRAVENOUS | Status: DC | PRN
  Administered 2017-02-26: 10 mL via INTRA_ARTERIAL

## 2017-02-26 MED ORDER — ONDANSETRON HCL 4 MG/2ML IJ SOLN: 4.0000 mg | Freq: Four times a day (QID) | INTRAMUSCULAR | Status: DC | PRN

## 2017-02-26 MED ORDER — VERAPAMIL HCL 2.5 MG/ML IV SOLN
INTRAVENOUS | Status: AC
  Filled 2017-02-26: qty 2

## 2017-02-26 MED ORDER — MIDAZOLAM HCL 2 MG/2ML IJ SOLN
INTRAMUSCULAR | Status: AC
  Filled 2017-02-26: qty 2

## 2017-02-26 MED ORDER — SODIUM CHLORIDE 0.9% FLUSH: 3.0000 mL | INTRAVENOUS | Status: DC | PRN

## 2017-02-26 MED ORDER — SODIUM CHLORIDE 0.9 % WEIGHT BASED INFUSION: 1.0000 mL/kg/h | INTRAVENOUS | Status: DC

## 2017-02-26 MED ORDER — HEPARIN (PORCINE) IN NACL 2-0.9 UNIT/ML-% IJ SOLN
INTRAMUSCULAR | Status: AC
  Filled 2017-02-26: qty 1000

## 2017-02-26 MED ORDER — MIDAZOLAM HCL 2 MG/2ML IJ SOLN
INTRAMUSCULAR | Status: DC | PRN
  Administered 2017-02-26: 2 mg via INTRAVENOUS

## 2017-02-26 MED ORDER — IOPAMIDOL (ISOVUE-370) INJECTION 76%
INTRAVENOUS | Status: AC
  Filled 2017-02-26: qty 100

## 2017-02-26 MED ORDER — ACETAMINOPHEN 325 MG PO TABS: 650.0000 mg | ORAL_TABLET | ORAL | Status: DC | PRN

## 2017-02-26 MED ORDER — ASPIRIN 81 MG PO CHEW: 81.0000 mg | CHEWABLE_TABLET | ORAL | Status: DC

## 2017-02-26 MED ORDER — LIDOCAINE HCL (PF) 1 % IJ SOLN
INTRAMUSCULAR | Status: DC | PRN
  Administered 2017-02-26: 2 mL

## 2017-02-26 MED ORDER — SODIUM CHLORIDE 0.9 % WEIGHT BASED INFUSION
3.0000 mL/kg/h | INTRAVENOUS | Status: AC
  Administered 2017-02-26: 3 mL/kg/h via INTRAVENOUS

## 2017-02-26 MED ORDER — SODIUM CHLORIDE 0.9% FLUSH: 3.0000 mL | Freq: Two times a day (BID) | INTRAVENOUS | Status: DC

## 2017-02-26 MED ORDER — HEPARIN (PORCINE) IN NACL 2-0.9 UNIT/ML-% IJ SOLN
INTRAMUSCULAR | Status: AC | PRN
  Administered 2017-02-26: 1000 mL

## 2017-02-26 MED ORDER — SODIUM CHLORIDE 0.9 % IV SOLN: 250.0000 mL | INTRAVENOUS | Status: DC | PRN

## 2017-02-26 MED ORDER — HEPARIN SODIUM (PORCINE) 1000 UNIT/ML IJ SOLN
INTRAMUSCULAR | Status: AC
  Filled 2017-02-26: qty 1

## 2017-02-26 MED ORDER — IOPAMIDOL (ISOVUE-370) INJECTION 76%
INTRAVENOUS | Status: DC | PRN
  Administered 2017-02-26: 50 mL via INTRA_ARTERIAL

## 2017-02-26 MED ORDER — HEPARIN SODIUM (PORCINE) 1000 UNIT/ML IJ SOLN
INTRAMUSCULAR | Status: DC | PRN
  Administered 2017-02-26: 6000 [IU] via INTRAVENOUS

## 2017-02-26 SURGICAL SUPPLY — 9 items
CATH 5FR JL3.5 JR4 ANG PIG MP (CATHETERS) ×2 IMPLANT
DEVICE RAD COMP TR BAND LRG (VASCULAR PRODUCTS) ×2 IMPLANT
GLIDESHEATH SLEND SS 6F .021 (SHEATH) ×2 IMPLANT
GUIDEWIRE INQWIRE 1.5J.035X260 (WIRE) ×1 IMPLANT
INQWIRE 1.5J .035X260CM (WIRE) ×2
KIT HEART LEFT (KITS) ×2 IMPLANT
PACK CARDIAC CATHETERIZATION (CUSTOM PROCEDURE TRAY) ×2 IMPLANT
TRANSDUCER W/STOPCOCK (MISCELLANEOUS) ×2 IMPLANT
TUBING CIL FLEX 10 FLL-RA (TUBING) ×2 IMPLANT

## 2017-02-26 NOTE — Interval H&P Note (Signed)
Cath Lab Visit (complete for each Cath Lab visit)  Clinical Evaluation Leading to the Procedure:   ACS: No.  Non-ACS:    Anginal Classification: CCS III  Anti-ischemic medical therapy: Maximal Therapy (2 or more classes of medications)  Non-Invasive Test Results: No non-invasive testing performed  Prior CABG: No previous CABG      History and Physical Interval Note:  02/26/2017 12:23 PM  William Hendrix  has presented today for surgery, with the diagnosis of cp  The various methods of treatment have been discussed with the patient and family. After consideration of risks, benefits and other options for treatment, the patient has consented to  Procedure(s): Left Heart Cath and Coronary Angiography (N/A) as a surgical intervention .  The patient's history has been reviewed, patient examined, no change in status, stable for surgery.  I have reviewed the patient's chart and labs.  Questions were answered to the patient's satisfaction.     Sherren Mocha

## 2017-02-26 NOTE — Progress Notes (Signed)
TR band was taken off at 1605.  Site was unremarkable.  Dressing applied it is  Clean dry and intact

## 2017-02-26 NOTE — Discharge Instructions (Signed)
Radial Site Care °Refer to this sheet in the next few weeks. These instructions provide you with information about caring for yourself after your procedure. Your health care provider may also give you more specific instructions. Your treatment has been planned according to current medical practices, but problems sometimes occur. Call your health care provider if you have any problems or questions after your procedure. °What can I expect after the procedure? °After your procedure, it is typical to have the following: °· Bruising at the radial site that usually fades within 1-2 weeks. °· Blood collecting in the tissue (hematoma) that may be painful to the touch. It should usually decrease in size and tenderness within 1-2 weeks. °Follow these instructions at home: °· Take medicines only as directed by your health care provider. °· You may shower 24-48 hours after the procedure or as directed by your health care provider. Remove the bandage (dressing) and gently wash the site with plain soap and water. Pat the area dry with a clean towel. Do not rub the site, because this may cause bleeding. °· Do not take baths, swim, or use a hot tub until your health care provider approves. °· Check your insertion site every day for redness, swelling, or drainage. °· Do not apply powder or lotion to the site. °· Do not flex or bend the affected arm for 24 hours or as directed by your health care provider. °· Do not push or pull heavy objects with the affected arm for 24 hours or as directed by your health care provider. °· Do not lift over 10 lb (4.5 kg) for 5 days after your procedure or as directed by your health care provider. °· Ask your health care provider when it is okay to: °¨ Return to work or school. °¨ Resume usual physical activities or sports. °¨ Resume sexual activity. °· Do not drive home if you are discharged the same day as the procedure. Have someone else drive you. °· You may drive 24 hours after the procedure  unless otherwise instructed by your health care provider. °· Do not operate machinery or power tools for 24 hours after the procedure. °· If your procedure was done as an outpatient procedure, which means that you went home the same day as your procedure, a responsible adult should be with you for the first 24 hours after you arrive home. °· Keep all follow-up visits as directed by your health care provider. This is important. °Contact a health care provider if: °· You have a fever. °· You have chills. °· You have increased bleeding from the radial site. Hold pressure on the site. °Get help right away if: °· You have unusual pain at the radial site. °· You have redness, warmth, or swelling at the radial site. °· You have drainage (other than a small amount of blood on the dressing) from the radial site. °· The radial site is bleeding, and the bleeding does not stop after 30 minutes of holding steady pressure on the site. °· Your arm or hand becomes pale, cool, tingly, or numb. °This information is not intended to replace advice given to you by your health care provider. Make sure you discuss any questions you have with your health care provider. °Document Released: 10/31/2010 Document Revised: 03/05/2016 Document Reviewed: 04/16/2014 °Elsevier Interactive Patient Education © 2017 [employer] ° °

## 2017-02-26 NOTE — H&P (View-Only) (Signed)
HPI: FU CAD. Pt has had previous PCI of his LAD. Lower extremity Dopplers January 2015 normal. Patient had admission October 2017 with NSTEMI. Echocardiogram October 2017 showed ejection fraction 45-50%, mild right ventricular enlargement. Venous Dopplers October 2017 showed no DVT. Cardiac catheterization October 2017 showed 80% mid circumflex and 50% ulcerated mid RCA lesion. The LAD and distal RCA stents were patent. He had PCI of the circumflex and RCA with drug-eluting stents. Abd ultrasound 3/18 showed 3.1 x 3.1 cm. Since last seen, patient complains of chest tightness. It is substernal and occurs both with exertion and at rest. It can last 1 hour. No associated symptoms. The pain is not pleuritic. Resolve spontaneously. He also describes dyspnea on exertion and generalized fatigue.  Current Outpatient Prescriptions  Medication Sig Dispense Refill  . acetaminophen (TYLENOL) 500 MG tablet Take 1,000 mg by mouth 2 (two) times daily.    Marland Kitchen albuterol (PROVENTIL HFA;VENTOLIN HFA) 108 (90 Base) MCG/ACT inhaler Inhale 2 puffs into the lungs every 6 (six) hours as needed for wheezing or shortness of breath. 1 Inhaler 0  . allopurinol (ZYLOPRIM) 300 MG tablet TAKE 1 TABLET EVERY DAY 90 tablet 1  . aspirin EC 81 MG tablet Take 81 mg by mouth daily.    Marland Kitchen atorvastatin (LIPITOR) 40 MG tablet Take 1 tablet (40 mg total) by mouth daily. 90 tablet 0  . B Complex Vitamins (B COMPLEX-B12 PO) Take 1 tablet by mouth at bedtime.    . Cholecalciferol (VITAMIN D) 1000 UNITS capsule Take 1,000 Units by mouth at bedtime.     . clopidogrel (PLAVIX) 75 MG tablet Take 1 tablet (75 mg total) by mouth daily. 90 tablet 3  . diphenoxylate-atropine (LOMOTIL) 2.5-0.025 MG tablet 1-2 tab po qid as needed diarrhea. 30 tablet 0  . fenofibrate 160 MG tablet TAKE 1 TABLET EVERY DAY (Patient taking differently: TAKE 1 TABLET EVERY night) 90 tablet 1  . fluocinonide cream (LIDEX) 7.25 % Apply 1 application topically daily as  needed. FOR RASH    . fluticasone (FLONASE) 50 MCG/ACT nasal spray Place 2 sprays into both nostrils daily. (Patient taking differently: Place 2 sprays into both nostrils as needed. ) 16 g 1  . furosemide (LASIX) 80 MG tablet Take 0.5 tablets (40 mg total) by mouth daily. 45 tablet 1  . HYDROcodone-homatropine (HYCODAN) 5-1.5 MG/5ML syrup Take 5 mLs by mouth every 8 (eight) hours as needed for cough. 120 mL 0  . insulin NPH Human (NOVOLIN N) 100 UNIT/ML injection Inject 0.45 mLs (45 Units total) into the skin 2 (two) times daily before a meal.    . insulin regular (NOVOLIN R RELION) 100 units/mL injection Inject 0.25-0.3 mLs (25-30 Units total) into the skin 3 (three) times daily before meals. (Patient taking differently: Inject 30 Units into the skin 3 (three) times daily before meals. ) 70 mL 1  . isosorbide mononitrate (IMDUR) 30 MG 24 hr tablet Take 1 tablet (30 mg total) by mouth daily. 90 tablet 2  . loratadine (CLARITIN) 10 MG tablet Take 1 tablet (10 mg total) by mouth daily. 30 tablet 0  . metoprolol (LOPRESSOR) 50 MG tablet TAKE 1 TABLET TWICE DAILY 180 tablet 1  . mupirocin ointment (BACTROBAN) 2 % Place 1 application into the nose 2 (two) times daily. 22 g 0  . nitroGLYCERIN (NITROSTAT) 0.4 MG SL tablet Place 1 tablet (0.4 mg total) under the tongue every 5 (five) minutes as needed for chest pain. 25 tablet 3  .  ondansetron (ZOFRAN ODT) 8 MG disintegrating tablet Take 1 tablet (8 mg total) by mouth every 8 (eight) hours as needed for nausea or vomiting. 20 tablet 0  . pantoprazole (PROTONIX) 20 MG tablet Take 1 tablet (20 mg total) by mouth daily. 90 tablet 3  . tamsulosin (FLOMAX) 0.4 MG CAPS capsule TAKE 1 CAPSULE EVERY DAY 90 capsule 1  . tiZANidine (ZANAFLEX) 4 MG capsule Take 1 capsule (4 mg total) by mouth 4 (four) times daily as needed for muscle spasms. Take 1 tablet every 6 hours as needed; not to exceed 3 doses in 24 hours. 40 capsule 2  . TRUE METRIX BLOOD GLUCOSE TEST test  strip Check blood sugar 4 times daily.     No current facility-administered medications for this visit.      Past Medical History:  Diagnosis Date  . AAA (abdominal aortic aneurysm) ([place]) 12/15/2014   a. Mild aneurysmal dilatation of the infrarenal abdominal aorta 3.2 cm - f/u due by 2019.  . Arthritis    "all over" (07/30/2016)  . CAD (coronary artery disease)    a. stent to LAD 2000. b. possible spasm by cath 2001. c. IVUS/PTCA/DES to mLAD 05/2013. d. PTCA/DES of dRCA into ostial rPDA 07/2013. e. PTCA of OM2, DES to Cx, DES to [facility] 07/2016.  Marland Kitchen Chronic bronchitis ([place])   . Chronic diastolic CHF (congestive heart failure) ([place])   . Chronic lower back pain   . Chronic neck pain   . CKD (chronic kidney disease), stage III   . Diabetic peripheral neuropathy ([place])   . Ejection fraction    55%, 07/2010, mild inferior hypo  . GERD (gastroesophageal reflux disease)   . Gout   . H/O diverticulitis of colon 01/31/2015  . H/O hiatal hernia   . History of blood transfusion ~ 10/1940   "had pneumonia"  . HTN (hypertension)   . Hyperlipidemia   . Melanoma of forearm, left ([place]) 02/2011   with wide excision   . Nephrolithiasis   . Obesity   . OSA on CPAP     [name] since 2000  . Positive D-dimer    a. significant elevation ,hospital 07/2010, etiology unclear. b. D Dimer chronically > 20.  . Skin cancer   . Spinal stenosis    a. s/p surgical repair 2013  . Type II diabetes mellitus ([place])   . Ventral hernia     Past Surgical History:  Procedure Laterality Date  . BACK SURGERY    . CARDIAC CATHETERIZATION N/A 07/30/2016   Procedure: Left Heart Cath and Coronary Angiography;  Surgeon: Nelva Bush, MD;  Location: [place] CV LAB;  Service: Cardiovascular;  Laterality: N/A;  . CARDIAC CATHETERIZATION N/A 07/30/2016   Procedure: Coronary Stent Intervention;  Surgeon: Nelva Bush, MD;  Location: [place] CV LAB;  Service: Cardiovascular;  Laterality: N/A;  Mid CFX and MID RCA   . CARDIAC CATHETERIZATION N/A 07/30/2016   Procedure: Coronary Balloon Angioplasty;  Surgeon: Nelva Bush, MD;  Location: [place] CV LAB;  Service: Cardiovascular;  Laterality: N/A;  OM 1  . CARPAL TUNNEL RELEASE Left   . CATARACT EXTRACTION W/ INTRAOCULAR LENS  IMPLANT, BILATERAL Bilateral ~ 2014  . CERVICAL DISC SURGERY  1990s  . CORONARY ANGIOPLASTY WITH STENT PLACEMENT  05/2013  . CORONARY ANGIOPLASTY WITH STENT PLACEMENT  07/26/2013   DES to RCA extending to PDA    . CORONARY ANGIOPLASTY WITH STENT PLACEMENT  07/30/2016  . CORONARY ANGIOPLASTY WITH STENT PLACEMENT  2000  CAD  . DENTAL SURGERY  04/2016   "got infected; had to dig it out"  . KNEE ARTHROSCOPY Right 1990's   rt  . LEFT AND RIGHT HEART CATHETERIZATION WITH CORONARY ANGIOGRAM N/A 07/26/2013   Procedure: LEFT AND RIGHT HEART CATHETERIZATION WITH CORONARY ANGIOGRAM;  Surgeon: [name], MD;  Location: [place] CATH LAB;  Service: Cardiovascular;  Laterality: N/A;  . LEFT HEART CATHETERIZATION WITH CORONARY ANGIOGRAM N/A 05/19/2013   Procedure: LEFT HEART CATHETERIZATION WITH CORONARY ANGIOGRAM;  Surgeon: [name], MD;  Location: [facility] CATH LAB;  Service: Cardiovascular;  Laterality: N/A;  . LUMBAR LAMINECTOMY/DECOMPRESSION MICRODISCECTOMY  08/30/2012   Procedure: LUMBAR LAMINECTOMY/DECOMPRESSION MICRODISCECTOMY 2 LEVELS;  Surgeon: [name], MD;  Location: [place] NEURO ORS;  Service: Neurosurgery;  Laterality: Bilateral;  Bilateral Lumbar three-four Lumbar four-five Laminotomies  . LUMBAR SPINE SURGERY  04/07/2016   Dr. Ellene Route; "?ruptured disc"  . MELANOMA EXCISION Left 02/2011   forearm  . NASAL SINUS SURGERY  1970s   "cut windows in sinus pockets"  . PERCUTANEOUS CORONARY STENT INTERVENTION (PCI-S)  05/19/2013   Procedure: PERCUTANEOUS CORONARY STENT INTERVENTION (PCI-S);  Surgeon: [name], MD;  Location: [facility] C[facility] LAB;  Service: Cardiovascular;;  . ULNAR TUNNEL RELEASE Left 04/07/2016   Dr. Ellene Route     Social History   Social History  . Marital status: Married    Spouse name: N/A  . Number of children: N/A  . Years of education: N/A   Occupational History  . [employer]    Social History Main Topics  . Smoking status: Former Smoker    Packs/day: 3.00    Years: 30.00    Types: Cigarettes    Quit date: 09/17/1996  . Smokeless tobacco: Never Used  . Alcohol use No  . Drug use: No  . Sexual activity: Not Currently    Birth control/ protection: None   Other Topics Concern  . Not on file   Social History Narrative   Married and lives locally with his wife.  [employer].    Family History  Problem Relation Age of Onset  . Cancer Mother        intestinal   . Heart disease Mother   . Cancer Father        prostate  . Migraines Daughter   . Leukemia Sister   . Heart disease Sister   . Prostate cancer Unknown   . Kidney cancer Unknown   . Cancer Unknown        Bladder cancer  . Coronary artery disease Unknown   . Hypertension Sister   . Hypertension Brother   . Heart attack Neg Hx   . Stroke Neg Hx     ROS: fatigue but no fevers or chills, productive cough, hemoptysis, dysphasia, odynophagia, melena, hematochezia, dysuria, hematuria, rash, seizure activity, orthopnea, PND, pedal edema, claudication. Remaining systems are negative.  Physical Exam: Well-developed obese in no acute distress.  Skin is warm and dry.  HEENT is normal.  Neck is supple.  Chest is clear to auscultation with normal expansion.  Cardiovascular exam is regular rate and rhythm.  Abdominal exam nontender or distended. No masses palpated. Extremities show no edema. neuro grossly intact  ECG- Normal sinus rhythm at a rate of 66. Left anterior fascicular block. personally reviewed  A/P  1 chest tightness-patient is describing chest tightness both with activities and at rest. Unclear if this is similar to previous infarct pain. He is very concerned about his symptoms. I will arrange a cardiac  catheterization to evaluate recently placed stents. The risks and benefits were discussed and he agrees to proceed. The risks include myocardial infarction, CVA and death. I will also check a d-dimer and BNP given his complaints of dyspnea on exertion.   2 coronary artery disease-continue aspirin and Plavix. I will plan to discontinue Plavix at the end of October. Continue statin.   3 abdominal aortic aneurysm-patient will need follow-up abdominal ultrasound March 2019.  4 hypertension-blood pressure is controlled. Continue present medications.  5 hyperlipidemia-continue statin. Check lipids.    Kirk Ruths, MD

## 2017-03-01 ENCOUNTER — Encounter (HOSPITAL_COMMUNITY): Payer: Self-pay | Admitting: Cardiovascular Disease

## 2017-03-01 ENCOUNTER — Telehealth: Payer: Self-pay | Admitting: Cardiology

## 2017-03-01 NOTE — Telephone Encounter (Signed)
New message     Pt c/o of Chest Pain: STAT if CP now or developed within 24 hours  1. Are you having CP right now?  Chest tightness 2. Are you experiencing any other symptoms (ex. SOB, nausea, vomiting, sweating)?  sob  3. How long have you been experiencing CP?  Pt had a cath on 5-18 that was normal.  Pt had chest pain prior to cath and still having pain 4. Is your CP continuous or coming and going?  Comes and goes 5. Have you taken Nitroglycerin?  ?no

## 2017-03-01 NOTE — Telephone Encounter (Signed)
Spoke with pt, Aware of dr crenshaw's recommendations.  °

## 2017-03-01 NOTE — Telephone Encounter (Signed)
plavix would not cause this; brilinta has been DCed and would not cause this residual effect; increase lasix to 60 mg daily to see if that helps; bmet one week Kirk Ruths

## 2017-03-01 NOTE — Telephone Encounter (Signed)
Returned call to patient's wife Dr Burt Knack did [facility] 5/18 that did not show obstruction/patent stents Patient still having CP & SOB Wife states when he had 2 stents last year, he was started on Brilinta and it "almost killed him" and he "couldn't breathe, his chest was all squeezed up" She states he took Plavix in the past and tolerated it fine but now she wonders if he is having a rxn to Plavix She wants to know if the Brilinta started something in his body that could cause him to now have SE to Plavix.  She would like MD opinion on her concerns  Routed to Dr. Lyda Kalata

## 2017-03-02 ENCOUNTER — Ambulatory Visit (INDEPENDENT_AMBULATORY_CARE_PROVIDER_SITE_OTHER): Payer: Medicare HMO | Admitting: Internal Medicine

## 2017-03-02 ENCOUNTER — Encounter: Payer: Self-pay | Admitting: Internal Medicine

## 2017-03-02 ENCOUNTER — Telehealth: Payer: Self-pay | Admitting: Cardiology

## 2017-03-02 VITALS — BP 132/72 | HR 70 | Wt 265.0 lb

## 2017-03-02 DIAGNOSIS — IMO0002 Reserved for concepts with insufficient information to code with codable children: Secondary | ICD-10-CM

## 2017-03-02 DIAGNOSIS — Z794 Long term (current) use of insulin: Secondary | ICD-10-CM | POA: Diagnosis not present

## 2017-03-02 DIAGNOSIS — E1165 Type 2 diabetes mellitus with hyperglycemia: Secondary | ICD-10-CM

## 2017-03-02 DIAGNOSIS — E1159 Type 2 diabetes mellitus with other circulatory complications: Secondary | ICD-10-CM | POA: Diagnosis not present

## 2017-03-02 DIAGNOSIS — R072 Precordial pain: Secondary | ICD-10-CM

## 2017-03-02 LAB — POCT GLYCOSYLATED HEMOGLOBIN (HGB A1C): Hemoglobin A1C: 7.5

## 2017-03-02 MED ORDER — INSULIN NPH (HUMAN) (ISOPHANE) 100 UNIT/ML ~~LOC~~ SUSP: SUBCUTANEOUS | Status: DC

## 2017-03-02 MED ORDER — FUROSEMIDE 40 MG PO TABS: 60.0000 mg | ORAL_TABLET | Freq: Every day | ORAL | 3 refills | Status: DC

## 2017-03-02 MED ORDER — ISOSORBIDE MONONITRATE ER 30 MG PO TB24: 30.0000 mg | ORAL_TABLET | Freq: Every day | ORAL | 3 refills | Status: DC

## 2017-03-02 NOTE — Telephone Encounter (Signed)
Spoke with pt wife, questions regarding lasix dose and refills sent to the pharmacy.

## 2017-03-02 NOTE — Addendum Note (Signed)
Addended by: Milderd Meager on: 03/02/2017 01:41 PM   Modules accepted: Orders

## 2017-03-02 NOTE — Progress Notes (Signed)
Patient ID: William Hendrix, male   DOB: May 22, 1941, 76 y.o.   MRN: [id number]  HPI: William Hendrix is a 76 y.o.-year-old male, returning for f/u for DM2, dx 2009, insulin-dependent, uncontrolled, with complications (CAD s/p AMI 2017, CKD stage 3). Last visit 3 mo ago.  He had a cardiac cath 4 days ago >> No new blockages.  Furosemide was recently increased to 60 mg daily .  Last hemoglobin A1c was: Lab Results  Component Value Date   HGBA1C 7.7 11/26/2016   HGBA1C 6.5 08/28/2016   HGBA1C 7.0 05/28/2016   He is now on: ReliOn insulin doses: Insulin Before breakfast Before lunch Before dinner  Regular 30-35 30-35 30-35  NPH 45  45  We cannot use Metformin 2/2 CKD. We stopped Januvia >> could not afford ($100/3 mo)  We cannot use Levemir >> developed a severe skin reaction We stopped Amaryl 4 mg.  He brings his log. He checks 3 times a day: - am:  120-169 >> 114, 147-178 >> 90-146 >> 79, 90-141, 189 >> 158-190 >> 149-226, 269 - Before lunch:   146-179 >> 123, 130-148 >> 81, 118-149, 161 >> 160-185 >> 141-221, 241 - before dinner: 109-173, 191, 225 >> 66, 81-151 >> 51, 75-167, 179, 187 >> 160-185 >> 103-201 - Before bedtime: 175-240 >> 157-179 >> 102 >> n/c >> 87-177 >> n/c >> 131-276 Lowest sugar was 42 x1, 60 >> 51 x1 >> 103,  he does not know whether he has hypoglycemia awareness.  Highest sugar was 171 >> 189 >> 269  Pt has CKD, last BUN/creatinine was:  Lab Results  Component Value Date   BUN 20 02/26/2017   CREATININE 1.49 (H) 02/26/2017  ACR 0.7. Last lipids: Lab Results  Component Value Date   CHOL 133 05/15/2016   HDL 26.80 (L) 05/15/2016   LDLCALC 31 04/09/2014   LDLDIRECT 73.0 05/15/2016   TRIG 214.0 (H) 05/15/2016   CHOLHDL 5 05/15/2016   Pt's last eye exam was 09/2015. No DR. Had cataract sx in 2011. Denies numbness and tingling in his legs.  He had back surgery in 03/2016. He was admitted 2x in 2017 for: CP/SOB >> AMI. He had 2 stents placed.    PMH: Pt also also has a history of CAD, hypertension, hyperlipidemia, chronic kidney disease stage III, history of nephrolithiasis, obesity, hiatal hernia, obstructive sleep apnea, hyperglyceridemia, melanoma, spinal stenosis.  ROS: Constitutional:+  weight gain/no weight loss,+e, no subjective hyperthermia, no subjective hypothermia Eyes: no blurry vision, no xerophthalmia ENT: no sore throat, no nodules palpated in throat, no dysphagia, no odynophagia, no hoarseness Cardiovascular: no CP/+  SOB/no palpitations/no leg swelling Respiratory: no cough/+ SOB/no wheezing Gastrointestinal: no N/no V/no D/no C/no acid reflux Musculoskeletal: no muscle aches/+  joint aches Skin: no rashes, no hair loss Neurological: no tremors/no numbness/no tingling/no dizziness  I reviewed pt's medications, allergies, PMH, social hx, family hx, and changes were documented in the history of present illness. Otherwise, unchanged from my initial visit note.  PE: BP 132/72 (BP Location: Left Arm, Patient Position: Sitting)   Pulse 70   Wt 265 lb (120.2 kg)   SpO2 96%   BMI 37.49 kg/m   Body mass index is 37.49 kg/m. Wt Readings from Last 3 Encounters:  03/02/17 265 lb (120.2 kg)  02/26/17 269 lb (122 kg)  02/24/17 269 lb 1.9 oz (122.1 kg)   Constitutional: obese, in NAD Eyes: PERRLA, EOMI, no exophthalmos ENT: moist mucous membranes, no thyromegaly, no cervical lymphadenopathy Cardiovascular:  RRR, No MRG Respiratory: CTA B Gastrointestinal: abdomen soft, NT, ND, BS+ Musculoskeletal: no deformities, strength intact in all 4 Skin: moist, warm, no rashes Neurological: no tremor with outstretched hands, DTR normal in all 4  ASSESSMENT: 1. DM2, insulin-dependent, uncontrolled, with complications - CAD, s/p stents - He had stenting of distal RCA/PDA in 07/2013. Prior stenting of LAD in 04/2013. 2 new stents: PCI to left circumflex and the mid RCA on 07/30/2016. - AAA - CKD stage 3  PLAN:  1.  Patient with Worsening blood sugars in the last few months. However, oday, HbA1c is 7.5% (slightly better), which is surprising. We discussed at length about improving his diet as I can see some perfect sugars he his log, which were most likely check when he was eating more plant-based meals.  - For now, see sugars in the morning are too high, will increase the NPH at night  - ideally, I would love to be able to add Jardiance or Trulicity for example, however, he cannot afford this. Since I cannot do much in terms of his medication regimen, I strongly encouraged her to start to make better choices with his diet.  - I advised him to: Patient Instructions   Please increase:  Insulin Before breakfast Before lunch Before dinner  Regular 30-35 30-35 30-35  NPH 45  55   Please come back for a follow-up appointment in 3 months.  - continue checking sugars at different times of the day - check 3x a day, rotating checks.  I advised him to check some sugars at bedtime.   - advised for yearly eye exams >> he is UTD - Return to clinic in 3 mo with sugar log   William Kingdom, MD PhD [facility] Endocrinology

## 2017-03-02 NOTE — Patient Instructions (Addendum)
Please increase:  Insulin Before breakfast Before lunch Before dinner  Regular 30-35 30-35 30-35  NPH 45  55   Please come back for a follow-up appointment in 3 months.

## 2017-03-02 NOTE — Telephone Encounter (Signed)
William Hendrix is calling about the Medication in which William Hendrix is taking , She has some mix ups and is wanting to speak with you about it . Please call

## 2017-03-03 DIAGNOSIS — D225 Melanocytic nevi of trunk: Secondary | ICD-10-CM | POA: Diagnosis not present

## 2017-03-03 DIAGNOSIS — C44519 Basal cell carcinoma of skin of other part of trunk: Secondary | ICD-10-CM | POA: Diagnosis not present

## 2017-03-03 DIAGNOSIS — L570 Actinic keratosis: Secondary | ICD-10-CM | POA: Diagnosis not present

## 2017-03-03 DIAGNOSIS — X32XXXD Exposure to sunlight, subsequent encounter: Secondary | ICD-10-CM | POA: Diagnosis not present

## 2017-03-03 DIAGNOSIS — L708 Other acne: Secondary | ICD-10-CM | POA: Diagnosis not present

## 2017-03-17 ENCOUNTER — Other Ambulatory Visit: Payer: Self-pay | Admitting: Internal Medicine

## 2017-03-29 ENCOUNTER — Telehealth: Payer: Self-pay | Admitting: Family Medicine

## 2017-03-29 MED ORDER — METOPROLOL TARTRATE 50 MG PO TABS: 50.0000 mg | ORAL_TABLET | Freq: Two times a day (BID) | ORAL | 1 refills | Status: DC

## 2017-03-29 MED ORDER — TAMSULOSIN HCL 0.4 MG PO CAPS: 0.4000 mg | ORAL_CAPSULE | Freq: Every day | ORAL | 1 refills | Status: DC

## 2017-03-29 MED ORDER — ATORVASTATIN CALCIUM 40 MG PO TABS: 40.0000 mg | ORAL_TABLET | Freq: Every day | ORAL | 1 refills | Status: DC

## 2017-03-29 MED ORDER — FENOFIBRATE 160 MG PO TABS: ORAL_TABLET | ORAL | 1 refills | Status: DC

## 2017-03-29 NOTE — Telephone Encounter (Signed)
Caller name: Keyontay, Stolz Relationship to patient: wife Can be reached: [phone] [facility]: [facility]  Reason for call: pt needing refills on metoprolol, tamsulosin, fenofibrate, atorvastatin. Please send for 90 day supply of meds.

## 2017-03-29 NOTE — Telephone Encounter (Signed)
Sent all in to [facility]/wife informed

## 2017-04-15 NOTE — Progress Notes (Signed)
HPI: FU CAD. Pt has had previous PCI of his LAD. Lower extremity Dopplers January 2015 normal. Patient had admission October 2017 with NSTEMI. Echocardiogram October 2017 showed ejection fraction 45-50%, mild right ventricular enlargement. Venous Dopplers October 2017 showed no DVT. He had PCI of the circumflex and RCA with drug-eluting stents. Abd ultrasound 3/18 showed 3.1 x 3.1 cm. Cardiac catheterization repeated May 2018 because of dyspnea and chest pain. There was continued patency of stent segments in his RCA, left circumflex and LAD. Left ventricular end-diastolic pressure moderately elevated (23). BNP 9.8. DDimer-0.45. Since last seen, patient had one episode of chest pain relieved with nitroglycerin. He does have dyspnea on exertion but denies orthopnea or PND. Chronic mild pedal edema.  Current Outpatient Prescriptions  Medication Sig Dispense Refill  . acetaminophen (TYLENOL) 500 MG tablet Take 1,000 mg by mouth 2 (two) times daily.    Marland Kitchen albuterol (PROVENTIL HFA;VENTOLIN HFA) 108 (90 Base) MCG/ACT inhaler Inhale 2 puffs into the lungs every 6 (six) hours as needed for wheezing or shortness of breath. 1 Inhaler 0  . allopurinol (ZYLOPRIM) 300 MG tablet TAKE 1 TABLET EVERY DAY 90 tablet 1  . aspirin EC 81 MG tablet Take 81 mg by mouth daily.    Marland Kitchen atorvastatin (LIPITOR) 40 MG tablet Take 1 tablet (40 mg total) by mouth daily. 90 tablet 1  . B Complex Vitamins (B COMPLEX-B12 PO) Take 1 tablet by mouth at bedtime.    . Cholecalciferol (VITAMIN D) 1000 UNITS capsule Take 1,000 Units by mouth at bedtime.     . clopidogrel (PLAVIX) 75 MG tablet Take 1 tablet (75 mg total) by mouth daily. 90 tablet 3  . fenofibrate 160 MG tablet TAKE 1 TABLET EVERY night 90 tablet 1  . fluocinonide cream (LIDEX) 2.99 % Apply 1 application topically daily as needed. FOR RASH    . fluticasone (FLONASE) 50 MCG/ACT nasal spray Place 2 sprays into both nostrils daily. (Patient taking differently: Place 2 sprays  into both nostrils as needed for allergies. ) 16 g 1  . furosemide (LASIX) 40 MG tablet Take 1.5 tablets (60 mg total) by mouth daily. 90 tablet 3  . insulin NPH Human (NOVOLIN N) 100 UNIT/ML injection Inject 45 units in am and 55 in the evening 10 mL   . insulin regular (NOVOLIN R RELION) 100 units/mL injection Inject 0.25-0.3 mLs (25-30 Units total) into the skin 3 (three) times daily before meals. (Patient taking differently: Inject 30 Units into the skin 3 (three) times daily before meals. ) 70 mL 1  . isosorbide mononitrate (IMDUR) 30 MG 24 hr tablet Take 1 tablet (30 mg total) by mouth daily. 90 tablet 3  . metoprolol tartrate (LOPRESSOR) 50 MG tablet Take 1 tablet (50 mg total) by mouth 2 (two) times daily. 180 tablet 1  . nitroGLYCERIN (NITROSTAT) 0.4 MG SL tablet Place 1 tablet (0.4 mg total) under the tongue every 5 (five) minutes as needed for chest pain. 25 tablet 3  . pantoprazole (PROTONIX) 20 MG tablet Take 1 tablet (20 mg total) by mouth daily. 90 tablet 3  . tamsulosin (FLOMAX) 0.4 MG CAPS capsule Take 1 capsule (0.4 mg total) by mouth daily. 90 capsule 1  . tiZANidine (ZANAFLEX) 4 MG capsule Take 1 capsule (4 mg total) by mouth 4 (four) times daily as needed for muscle spasms. Take 1 tablet every 6 hours as needed; not to exceed 3 doses in 24 hours. 40 capsule 2  . BD  INSULIN SYRINGE ULTRAFINE 31G X 5/16" 1 ML MISC USE  TO INJECT  INSULIN  FIVE  TIMES  DAILY AS INSTRUCTED 450 each 3   No current facility-administered medications for this visit.      Past Medical History:  Diagnosis Date  . AAA (abdominal aortic aneurysm) ([place]) 12/15/2014   a. Mild aneurysmal dilatation of the infrarenal abdominal aorta 3.2 cm - f/u due by 2019.  . Arthritis    "all over" (07/30/2016)  . CAD (coronary artery disease)    a. stent to LAD 2000. b. possible spasm by cath 2001. c. IVUS/PTCA/DES to mLAD 05/2013. d. PTCA/DES of dRCA into ostial rPDA 07/2013. e. PTCA of OM2, DES to Cx, DES to [facility]  07/2016.  Marland Kitchen Chronic bronchitis ([place])   . Chronic diastolic CHF (congestive heart failure) ([place])   . Chronic lower back pain   . Chronic neck pain   . CKD (chronic kidney disease), stage III   . Diabetic peripheral neuropathy ([place])   . Ejection fraction    55%, 07/2010, mild inferior hypo  . GERD (gastroesophageal reflux disease)   . Gout   . H/O diverticulitis of colon 01/31/2015  . H/O hiatal hernia   . History of blood transfusion ~ 10/1940   "had pneumonia"  . HTN (hypertension)   . Hyperlipidemia   . Melanoma of forearm, left ([place]) 02/2011   with wide excision   . Nephrolithiasis   . Obesity   . OSA on CPAP     [name] since 2000  . Positive D-dimer    a. significant elevation ,hospital 07/2010, etiology unclear. b. D Dimer chronically > 20.  . Skin cancer   . Spinal stenosis    a. s/p surgical repair 2013  . Type II diabetes mellitus ([place])   . Ventral hernia     Past Surgical History:  Procedure Laterality Date  . BACK SURGERY    . CARDIAC CATHETERIZATION N/A 07/30/2016   Procedure: Left Heart Cath and Coronary Angiography;  Surgeon: Nelva Bush, MD;  Location: [place] CV [facility];  Service: Cardiovascular;  Laterality: N/A;  . CARDIAC CATHETERIZATION N/A 07/30/2016   Procedure: Coronary Stent Intervention;  Surgeon: Nelva Bush, MD;  Location: [place] CV [facility];  Service: Cardiovascular;  Laterality: N/A;  Mid CFX and MID RCA  . CARDIAC CATHETERIZATION N/A 07/30/2016   Procedure: Coronary Balloon Angioplasty;  Surgeon: Nelva Bush, MD;  Location: [place] [facility];  Service: Cardiovascular;  Laterality: N/A;  OM 1  . CARPAL TUNNEL RELEASE Left   . CATARACT EXTRACTION W/ INTRAOCULAR LENS  IMPLANT, BILATERAL Bilateral ~ 2014  . CERVICAL DISC SURGERY  1990s  . CORONARY ANGIOPLASTY WITH STENT PLACEMENT  05/2013  . CORONARY ANGIOPLASTY WITH STENT PLACEMENT  07/26/2013   DES to RCA extending to PDA    . CORONARY ANGIOPLASTY WITH STENT PLACEMENT  07/30/2016    . CORONARY ANGIOPLASTY WITH STENT PLACEMENT  2000   CAD  . DENTAL SURGERY  04/2016   "got infected; had to dig it out"  . KNEE ARTHROSCOPY Right 1990's   rt  . LEFT AND RIGHT HEART CATHETERIZATION WITH CORONARY ANGIOGRAM N/A 07/26/2013   Procedure: LEFT AND RIGHT HEART CATHETERIZATION WITH CORONARY ANGIOGRAM;  Surgeon: [name], MD;  Location: [place] CATH LAB;  Service: Cardiovascular;  Laterality: N/A;  . LEFT HEART CATH AND CORONARY ANGIOGRAPHY N/A 02/26/2017   Procedure: Left Heart Cath and Coronary Angiography;  Surgeon: Sherren Mocha, MD;  Location: [place] [facility];  Service: Cardiovascular;  Laterality: N/A;  . LEFT HEART CATHETERIZATION WITH CORONARY ANGIOGRAM N/A 05/19/2013   Procedure: LEFT HEART CATHETERIZATION WITH CORONARY ANGIOGRAM;  Surgeon: [name], MD;  Location: [facility] CATH LAB;  Service: Cardiovascular;  Laterality: N/A;  . LUMBAR LAMINECTOMY/DECOMPRESSION MICRODISCECTOMY  08/30/2012   Procedure: LUMBAR LAMINECTOMY/DECOMPRESSION MICRODISCECTOMY 2 LEVELS;  Surgeon: [name], MD;  Location: [place] NEURO ORS;  Service: Neurosurgery;  Laterality: Bilateral;  Bilateral Lumbar three-four Lumbar four-five Laminotomies  . LUMBAR SPINE SURGERY  04/07/2016   Dr. Ellene Route; "?ruptured disc"  . MELANOMA EXCISION Left 02/2011   forearm  . NASAL SINUS SURGERY  1970s   "cut windows in sinus pockets"  . PERCUTANEOUS CORONARY STENT INTERVENTION (PCI-S)  05/19/2013   Procedure: PERCUTANEOUS CORONARY STENT INTERVENTION (PCI-S);  Surgeon: [name], MD;  Location[facility] CATH LAB;  Service: Cardiovascular;;  . ULNAR TUNNEL RELEASE Left 04/07/2016   Dr. Ellene Route    Social History   Social History  . Marital status: Married    Spouse name: N/A  . Number of children: N/A  . Years of education: N/A   Occupational History  . [employer]    Social History Main Topics  . Smoking status: Former Smoker    Packs/day: 3.00    Years: 30.00    Types: Cigarettes    Quit date: 09/17/1996   . Smokeless tobacco: Never Used  . Alcohol use No  . Drug use: No  . Sexual activity: Not Currently    Birth control/ protection: None   Other Topics Concern  . Not on file   Social History Narrative   Married and lives locally with his wife.  [employer].    Family History  Problem Relation Age of Onset  . Cancer Mother        intestinal   . Heart disease Mother   . Cancer Father        prostate  . Migraines Daughter   . Leukemia Sister   . Heart disease Sister   . Prostate cancer Unknown   . Kidney cancer Unknown   . Cancer Unknown        Bladder cancer  . Coronary artery disease Unknown   . Hypertension Sister   . Hypertension Brother   . Heart attack Neg Hx   . Stroke Neg Hx     ROS: Back pain but no fevers or chills, productive cough, hemoptysis, dysphasia, odynophagia, melena, hematochezia, dysuria, hematuria, rash, seizure activity, orthopnea, PND, claudication. Remaining systems are negative.  Physical Exam: Well-developed obese in no acute distress.  Skin is warm and dry.  HEENT is normal.  Neck is supple.  Chest is clear to auscultation with normal expansion.  Cardiovascular exam is regular rate and rhythm.  Abdominal exam nontender or distended. No masses palpated. Extremities show trace edema. neuro grossly intact  ECG- Sinus rhythm at a rate of 62. Left anterior fascicular block. RV conduction delay. personally reviewed  A/P  1 Coronary artery disease-recent catheterization as outlined above. Plan medical therapy. Continue aspirin, Plavix and statin. I will discontinue Plavix at the end of October.  2 abdominal aortic aneurysm-plan follow-up abdominal ultrasound March 2019.  3 hypertension-blood pressure is controlled. Continue present medications.  4 hyperlipidemia-continue statin.  5 chest pain-patient has had only one episode of chest pain since his catheterization which revealed patent vessels. We will continue medical therapy.    Kirk Ruths, MD

## 2017-04-21 DIAGNOSIS — C44519 Basal cell carcinoma of skin of other part of trunk: Secondary | ICD-10-CM | POA: Diagnosis not present

## 2017-04-21 DIAGNOSIS — L570 Actinic keratosis: Secondary | ICD-10-CM | POA: Diagnosis not present

## 2017-04-21 DIAGNOSIS — X32XXXD Exposure to sunlight, subsequent encounter: Secondary | ICD-10-CM | POA: Diagnosis not present

## 2017-04-28 ENCOUNTER — Ambulatory Visit (INDEPENDENT_AMBULATORY_CARE_PROVIDER_SITE_OTHER): Payer: Medicare HMO | Admitting: Cardiology

## 2017-04-28 ENCOUNTER — Encounter: Payer: Self-pay | Admitting: Cardiology

## 2017-04-28 ENCOUNTER — Other Ambulatory Visit: Payer: Self-pay | Admitting: Internal Medicine

## 2017-04-28 VITALS — BP 106/63 | HR 65 | Ht 70.5 in | Wt 265.0 lb

## 2017-04-28 DIAGNOSIS — I10 Essential (primary) hypertension: Secondary | ICD-10-CM

## 2017-04-28 DIAGNOSIS — R072 Precordial pain: Secondary | ICD-10-CM

## 2017-04-28 DIAGNOSIS — I2510 Atherosclerotic heart disease of native coronary artery without angina pectoris: Secondary | ICD-10-CM

## 2017-04-28 DIAGNOSIS — I714 Abdominal aortic aneurysm, without rupture, unspecified: Secondary | ICD-10-CM

## 2017-04-28 DIAGNOSIS — E7800 Pure hypercholesterolemia, unspecified: Secondary | ICD-10-CM | POA: Diagnosis not present

## 2017-04-28 NOTE — Patient Instructions (Signed)
Your physician wants you to follow-up in: 6 MONTHS WITH DR CRENSHAW You will receive a reminder letter in the mail two months in advance. If you don't receive a letter, please call our office to schedule the follow-up appointment.   If you need a refill on your cardiac medications before your next appointment, please call your pharmacy.  

## 2017-05-10 ENCOUNTER — Other Ambulatory Visit: Payer: Self-pay | Admitting: Family Medicine

## 2017-05-13 ENCOUNTER — Encounter: Payer: Self-pay | Admitting: Family Medicine

## 2017-05-13 ENCOUNTER — Ambulatory Visit (INDEPENDENT_AMBULATORY_CARE_PROVIDER_SITE_OTHER): Payer: Medicare HMO | Admitting: Family Medicine

## 2017-05-13 DIAGNOSIS — E669 Obesity, unspecified: Secondary | ICD-10-CM

## 2017-05-13 DIAGNOSIS — E7800 Pure hypercholesterolemia, unspecified: Secondary | ICD-10-CM

## 2017-05-13 DIAGNOSIS — M5416 Radiculopathy, lumbar region: Secondary | ICD-10-CM

## 2017-05-13 DIAGNOSIS — E781 Pure hyperglyceridemia: Secondary | ICD-10-CM

## 2017-05-13 DIAGNOSIS — N183 Chronic kidney disease, stage 3 unspecified: Secondary | ICD-10-CM

## 2017-05-13 DIAGNOSIS — I10 Essential (primary) hypertension: Secondary | ICD-10-CM

## 2017-05-13 DIAGNOSIS — M1000 Idiopathic gout, unspecified site: Secondary | ICD-10-CM

## 2017-05-13 DIAGNOSIS — I2510 Atherosclerotic heart disease of native coronary artery without angina pectoris: Secondary | ICD-10-CM

## 2017-05-13 LAB — COMPREHENSIVE METABOLIC PANEL
ALK PHOS: 61 U/L (ref 39–117)
ALT: 23 U/L (ref 0–53)
AST: 24 U/L (ref 0–37)
Albumin: 4.1 g/dL (ref 3.5–5.2)
BILIRUBIN TOTAL: 0.4 mg/dL (ref 0.2–1.2)
BUN: 28 mg/dL — AB (ref 6–23)
CO2: 29 meq/L (ref 19–32)
CREATININE: 1.75 mg/dL — AB (ref 0.40–1.50)
Calcium: 10 mg/dL (ref 8.4–10.5)
Chloride: 103 mEq/L (ref 96–112)
GFR: 40.35 mL/min — ABNORMAL LOW (ref >60.00)
GLUCOSE: 202 mg/dL — AB (ref 70–99)
Potassium: 3.9 mEq/L (ref 3.5–5.1)
SODIUM: 139 meq/L (ref 135–145)
TOTAL PROTEIN: 7.1 g/dL (ref 6.0–8.3)

## 2017-05-13 LAB — LDL CHOLESTEROL, DIRECT: Direct LDL: 56 mg/dL

## 2017-05-13 LAB — CBC
HCT: 40 % (ref 39.0–52.0)
HEMOGLOBIN: 13.8 g/dL (ref 13.0–17.0)
MCHC: 34.6 g/dL (ref 30.0–36.0)
MCV: 94.2 fl (ref 78.0–100.0)
Platelets: 203 10*3/uL (ref 150.0–400.0)
RBC: 4.24 Mil/uL (ref 4.22–5.81)
RDW: 14.4 % (ref 11.5–15.5)
WBC: 7.2 10*3/uL (ref 4.0–10.5)

## 2017-05-13 LAB — URIC ACID: URIC ACID, SERUM: 4.7 mg/dL (ref 4.0–7.8)

## 2017-05-13 LAB — LIPID PANEL
Cholesterol: 126 mg/dL (ref 0–200)
HDL: 22.1 mg/dL — ABNORMAL LOW (ref >39.00)
Total CHOL/HDL Ratio: 6
Triglycerides: 516 mg/dL — ABNORMAL HIGH (ref 0.0–149.0)

## 2017-05-13 LAB — TSH: TSH: 3.01 u[IU]/mL (ref 0.35–4.50)

## 2017-05-13 NOTE — Assessment & Plan Note (Signed)
Tolerating statin, encouraged heart healthy diet, avoid trans fats, minimize simple carbs and saturated fats. Increase exercise as tolerated 

## 2017-05-13 NOTE — Assessment & Plan Note (Signed)
Was unable to proceed with cardiac rehab due to stress of traveling for daughter's illness.

## 2017-05-13 NOTE — Assessment & Plan Note (Signed)
Well controlled, no changes to meds. Encouraged heart healthy diet such as the DASH diet and exercise as tolerated.  °

## 2017-05-13 NOTE — Assessment & Plan Note (Signed)
Check cmp 

## 2017-05-13 NOTE — Assessment & Plan Note (Signed)
Check uric acid. 

## 2017-05-13 NOTE — Patient Instructions (Signed)

## 2017-05-13 NOTE — Progress Notes (Signed)
Subjective:  I acted as a Education administrator for Dr. Charlett Blake. [name], [place]  Patient ID: William Hendrix, male    DOB: 04-03-1941, 76 y.o.   MRN: [id number]  No chief complaint on file.   HPI  Patient is in today for a 3 month follow up for his diabetes. Patient c/o back pain, and edema in both legs. No recent febrile illness or acute hospitalizations. Denies CP/palp/SOB/HA/congestion/fevers/GI or GU c/o. Taking meds as prescribed. No polyuria or polydipsia. No change in bowel or bladder habits. His back pain is ongoing but has been escalating. He has pain shooting down his legs at times. His pain is significant enough to make it difficult to walk. Denies any falls. Has had surgery in his low back with Dr. Ellene Route of neurosurgery in the past. Does also endorse diffuse myalgias. Denies CP/palp/SOB/HA/congestion/fevers/GI or GU c/o. Taking meds as prescribed   Patient Care Team: [name], MD as PCP - General (Family Medicine) [name], MD (Neurosurgery) Lelon Perla, MD as Consulting Physician (Cardiology) Rutherford Guys, MD as Consulting Physician (Ophthalmology)   Past Medical History:  Diagnosis Date  . AAA (abdominal aortic aneurysm) ([place]) 12/15/2014   a. Mild aneurysmal dilatation of the infrarenal abdominal aorta 3.2 cm - f/u due by 2019.  . Arthritis    "all over" (07/30/2016)  . CAD (coronary artery disease)    a. stent to LAD 2000. b. possible spasm by cath 2001. c. IVUS/PTCA/DES to mLAD 05/2013. d. PTCA/DES of dRCA into ostial rPDA 07/2013. e. PTCA of OM2, DES to Cx, DES to [facility] ([place]) 07/2016.  Marland Kitchen Chronic bronchitis ([place])   . Chronic diastolic CHF (congestive heart failure) ([place])   . Chronic lower back pain   . Chronic neck pain   . CKD (chronic kidney disease), stage III   . Diabetic peripheral neuropathy ([place])   . Ejection fraction    55%, 07/2010, mild inferior hypo  . GERD (gastroesophageal reflux disease)   . Gout   . H/O diverticulitis of colon 01/31/2015  . H/O hiatal  hernia   . History of blood transfusion ~ 10/1940   "had pneumonia"  . HTN (hypertension)   . Hyperlipidemia   . Melanoma of forearm, left ([place]) 02/2011   with wide excision   . Nephrolithiasis   . Obesity   . OSA on CPAP     [name] since 2000  . Positive D-dimer    a. significant elevation ,hospital 07/2010, etiology unclear. b. D Dimer chronically > 20.  . Skin cancer   . Spinal stenosis    a. s/p surgical repair 2013  . Type II diabetes mellitus ([place])   . Ventral hernia     Past Surgical History:  Procedure Laterality Date  . BACK SURGERY    . CARDIAC CATHETERIZATION N/A 07/30/2016   Procedure: Left Heart Cath and Coronary Angiography;  Surgeon: Nelva Bush, MD;  Location: [facility];  Service: Cardiovascular;  Laterality: N/A;  . CARDIAC CATHETERIZATION N/A 07/30/2016   Procedure: Coronary Stent Intervention;  Surgeon: Nelva Bush, MD;  Location: [facility];  Service: Cardiovascular;  Laterality: N/A;  Mid CFX and MID RCA  . CARDIAC CATHETERIZATION N/A 07/30/2016   Procedure: Coronary Balloon Angioplasty;  Surgeon: Nelva Bush, MD;  Location: [facility];  Service: Cardiovascular;  Laterality: N/A;  OM 1  . CARPAL TUNNEL RELEASE Left   . CATARACT EXTRACTION W/ INTRAOCULAR LENS  IMPLANT, BILATERAL Bilateral ~ 2014  . CERVICAL DISC SURGERY  1990s  . CORONARY ANGIOPLASTY WITH STENT PLACEMENT  05/2013  . CORONARY ANGIOPLASTY WITH STENT PLACEMENT  07/26/2013   DES to RCA extending to PDA    . CORONARY ANGIOPLASTY WITH STENT PLACEMENT  07/30/2016  . CORONARY ANGIOPLASTY WITH STENT PLACEMENT  2000   CAD  . DENTAL SURGERY  04/2016   "got infected; had to dig it out"  . KNEE ARTHROSCOPY Right 1990's   rt  . LEFT AND RIGHT HEART CATHETERIZATION WITH CORONARY ANGIOGRAM N/A 07/26/2013   Procedure: LEFT AND RIGHT HEART CATHETERIZATION WITH CORONARY ANGIOGRAM;  Surgeon: [name], MD;  Location: [facility] LAB;  Service: Cardiovascular;   Laterality: N/A;  . LEFT HEART CATH AND CORONARY ANGIOGRAPHY N/A 02/26/2017   Procedure: Left Heart Cath and Coronary Angiography;  Surgeon: Sherren Mocha, MD;  Location: [facility];  Service: Cardiovascular;  Laterality: N/A;  . LEFT HEART CATHETERIZATION WITH CORONARY ANGIOGRAM N/A 05/19/2013   Procedure: LEFT HEART CATHETERIZATION WITH CORONARY ANGIOGRAM;  Surgeon: [name], MD;  Location: [facility] CATH LAB;  Service: Cardiovascular;  Laterality: N/A;  . LUMBAR LAMINECTOMY/DECOMPRESSION MICRODISCECTOMY  08/30/2012   Procedure: LUMBAR LAMINECTOMY/DECOMPRESSION MICRODISCECTOMY 2 LEVELS;  Surgeon: [name], MD;  Location: [facility] NEURO ORS;  Service: Neurosurgery;  Laterality: Bilateral;  Bilateral Lumbar three-four Lumbar four-five Laminotomies  . LUMBAR SPINE SURGERY  04/07/2016   Dr. Ellene Route; "?ruptured disc"  . MELANOMA EXCISION Left 02/2011   forearm  . NASAL SINUS SURGERY  1970s   "cut windows in sinus pockets"  . PERCUTANEOUS CORONARY STENT INTERVENTION (PCI-S)  05/19/2013   Procedure: PERCUTANEOUS CORONARY STENT INTERVENTION (PCI-S);  Surgeon: [name], MD;  Location: [facility] CATH LAB;  Service: Cardiovascular;;  . ULNAR TUNNEL RELEASE Left 04/07/2016   Dr. Ellene Route    Family History  Problem Relation Age of Onset  . Cancer Mother        intestinal   . Heart disease Mother   . Cancer Father        prostate  . Migraines Daughter   . Leukemia Sister   . Heart disease Sister   . Prostate cancer Unknown   . Kidney cancer Unknown   . Cancer Unknown        Bladder cancer  . Coronary artery disease Unknown   . Hypertension Sister   . Hypertension Brother   . Heart attack Neg Hx   . Stroke Neg Hx     Social History   Social History  . Marital status: Married    Spouse name: N/A  . Number of children: N/A  . Years of education: N/A   Occupational History  . [employer]    Social History Main Topics  . Smoking status: Former Smoker    Packs/day: 3.00    Years:  30.00    Types: Cigarettes    Quit date: 09/17/1996  . Smokeless tobacco: Never Used  . Alcohol use No  . Drug use: No  . Sexual activity: Not Currently    Birth control/ protection: None   Other Topics Concern  . Not on file   Social History Narrative   Married and lives locally with his wife.  [employer].    Outpatient Medications Prior to Visit  Medication Sig Dispense Refill  . acetaminophen (TYLENOL) 500 MG tablet Take 1,000 mg by mouth 2 (two) times daily.    Marland Kitchen albuterol (PROVENTIL HFA;VENTOLIN HFA) 108 (90 Base) MCG/ACT inhaler Inhale 2 puffs into the lungs every 6 (six) hours as needed for  wheezing or shortness of breath. 1 Inhaler 0  . allopurinol (ZYLOPRIM) 300 MG tablet TAKE 1 TABLET EVERY DAY 90 tablet 1  . aspirin EC 81 MG tablet Take 81 mg by mouth daily.    Marland Kitchen atorvastatin (LIPITOR) 40 MG tablet Take 1 tablet (40 mg total) by mouth daily. 90 tablet 1  . B Complex Vitamins (B COMPLEX-B12 PO) Take 1 tablet by mouth at bedtime.    . BD INSULIN SYRINGE ULTRAFINE 31G X 5/16" 1 ML MISC USE  TO INJECT  INSULIN  FIVE  TIMES  DAILY AS INSTRUCTED 450 each 3  . Cholecalciferol (VITAMIN D) 1000 UNITS capsule Take 1,000 Units by mouth at bedtime.     . clopidogrel (PLAVIX) 75 MG tablet Take 1 tablet (75 mg total) by mouth daily. 90 tablet 3  . fenofibrate 160 MG tablet Take 160 mg by mouth daily.    . fluocinonide cream (LIDEX) 8.52 % Apply 1 application topically daily as needed. FOR RASH    . fluticasone (FLONASE) 50 MCG/ACT nasal spray Place 2 sprays into both nostrils daily. (Patient taking differently: Place 2 sprays into both nostrils as needed for allergies. ) 16 g 1  . furosemide (LASIX) 40 MG tablet Take 1.5 tablets (60 mg total) by mouth daily. 90 tablet 3  . insulin NPH Human (NOVOLIN N) 100 UNIT/ML injection Inject 45 units in am and 55 in the evening 10 mL   . insulin regular (NOVOLIN R RELION) 100 units/mL injection Inject 0.25-0.3 mLs (25-30 Units total) into the skin 3  (three) times daily before meals. (Patient taking differently: Inject 30 Units into the skin 3 (three) times daily before meals. ) 70 mL 1  . isosorbide mononitrate (IMDUR) 30 MG 24 hr tablet Take 1 tablet (30 mg total) by mouth daily. 90 tablet 3  . metoprolol tartrate (LOPRESSOR) 50 MG tablet Take 1 tablet (50 mg total) by mouth 2 (two) times daily. 180 tablet 1  . nitroGLYCERIN (NITROSTAT) 0.4 MG SL tablet Place 1 tablet (0.4 mg total) under the tongue every 5 (five) minutes as needed for chest pain. 25 tablet 3  . pantoprazole (PROTONIX) 20 MG tablet Take 1 tablet (20 mg total) by mouth daily. 90 tablet 3  . tamsulosin (FLOMAX) 0.4 MG CAPS capsule Take 1 capsule (0.4 mg total) by mouth daily. 90 capsule 1  . tiZANidine (ZANAFLEX) 4 MG capsule Take 1 capsule (4 mg total) by mouth 4 (four) times daily as needed for muscle spasms. Take 1 tablet every 6 hours as needed; not to exceed 3 doses in 24 hours. 40 capsule 2  . TRUE METRIX BLOOD GLUCOSE TEST test strip USE TO TEST BLOOD SUGAR THREE TIMES DAILY AS DIRECTED 300 each 3   No facility-administered medications prior to visit.     Allergies  Allergen Reactions  . Levemir [Insulin Detemir] Swelling    Patient had redness and swelling and tenderness at injection site.  . Codeine Other (See Comments)    Makes him "shakey", is OK with hydrocodone GI UPSET & TREMORS  . Lipitor [Atorvastatin] Other (See Comments)    Muscle aches; liver functions. Patient is currently taken  . Novolog Mix [Insulin Aspart Prot & Aspart] Other (See Comments)    Causes skin to swell/itch at injection site  . Other Other (See Comments)    Brilinta caused SOB    Review of Systems  Constitutional: Negative for fever and malaise/fatigue.  HENT: Negative for congestion.   Eyes: Negative for blurred  vision.  Respiratory: Negative for cough and shortness of breath.   Cardiovascular: Negative for chest pain, palpitations and leg swelling.  Gastrointestinal: Negative  for vomiting.  Musculoskeletal: Positive for back pain, joint pain, myalgias and neck pain.  Skin: Negative for rash.  Neurological: Negative for loss of consciousness and headaches.       Objective:    Physical Exam  Constitutional: He is oriented to person, place, and time. He appears well-developed and well-nourished. No distress.  HENT:  Head: Normocephalic and atraumatic.  Eyes: Conjunctivae are normal.  Neck: Normal range of motion. No thyromegaly present.  Cardiovascular: Normal rate and regular rhythm.   Pulmonary/Chest: Effort normal and breath sounds normal. He has no wheezes.  Abdominal: Soft. Bowel sounds are normal. There is no tenderness.  Musculoskeletal: Normal range of motion. He exhibits no edema or deformity.  Neurological: He is alert and oriented to person, place, and time.  Skin: Skin is warm and dry. He is not diaphoretic.  Psychiatric: He has a normal mood and affect.    BP 110/66 (BP Location: Left Arm, Patient Position: Sitting, Cuff Size: Normal)   Pulse 68   Temp 98.2 F (36.8 C) (Oral)   Resp 18   Wt 268 lb 3.2 oz (121.7 kg)   SpO2 98%   BMI 37.94 kg/m  Wt Readings from Last 3 Encounters:  05/13/17 268 lb 3.2 oz (121.7 kg)  04/28/17 265 lb (120.2 kg)  03/02/17 265 lb (120.2 kg)   BP Readings from Last 3 Encounters:  05/13/17 110/66  04/28/17 106/63  03/02/17 132/72     Immunization History  Administered Date(s) Administered  . Influenza Split 08/08/2012  . Influenza Whole 08/08/2009, 07/12/2010  . Influenza, High Dose Seasonal PF 08/02/2014, 07/11/2015  . Influenza,inj,Quad PF,36+ Mos 07/27/2013, 08/04/2016  . Pneumococcal Conjugate-13 08/02/2014  . Pneumococcal Polysaccharide-23 07/28/2005    Health Maintenance  Topic Date Due  . TETANUS/TDAP  03/13/1959  . URINE MICROALBUMIN  08/03/2015  . FOOT EXAM  05/21/2016  . OPHTHALMOLOGY EXAM  09/29/2016  . INFLUENZA VACCINE  05/12/2017  . HEMOGLOBIN A1C  09/02/2017  . PNA vac Low  Risk Adult  Completed    Lab Results  Component Value Date   WBC 7.2 05/13/2017   HGB 13.8 05/13/2017   HCT 40.0 05/13/2017   PLT 203.0 05/13/2017   GLUCOSE 202 (H) 05/13/2017   CHOL 126 05/13/2017   TRIG (H) 05/13/2017    516.0 Triglyceride is over 400; calculations on Lipids are invalid.   HDL 22.10 (L) 05/13/2017   LDLDIRECT 56.0 05/13/2017   LDLCALC 31 04/09/2014   ALT 23 05/13/2017   AST 24 05/13/2017   NA 139 05/13/2017   K 3.9 05/13/2017   CL 103 05/13/2017   CREATININE 1.75 (H) 05/13/2017   BUN 28 (H) 05/13/2017   CO2 29 05/13/2017   TSH 3.01 05/13/2017   INR 1.1 02/24/2017   HGBA1C 7.5 03/02/2017   MICROALBUR 0.7 08/02/2014    Lab Results  Component Value Date   TSH 3.01 05/13/2017   Lab Results  Component Value Date   WBC 7.2 05/13/2017   HGB 13.8 05/13/2017   HCT 40.0 05/13/2017   MCV 94.2 05/13/2017   PLT 203.0 05/13/2017   Lab Results  Component Value Date   NA 139 05/13/2017   K 3.9 05/13/2017   CO2 29 05/13/2017   GLUCOSE 202 (H) 05/13/2017   BUN 28 (H) 05/13/2017   CREATININE 1.75 (H) 05/13/2017   BILITOT 0.4  05/13/2017   ALKPHOS 61 05/13/2017   AST 24 05/13/2017   ALT 23 05/13/2017   PROT 7.1 05/13/2017   ALBUMIN 4.1 05/13/2017   CALCIUM 10.0 05/13/2017   ANIONGAP 8 02/26/2017   GFR 40.35 (L) 05/13/2017   Lab Results  Component Value Date   CHOL 126 05/13/2017   Lab Results  Component Value Date   HDL 22.10 (L) 05/13/2017   Lab Results  Component Value Date   LDLCALC 31 04/09/2014   Lab Results  Component Value Date   TRIG (H) 05/13/2017    516.0 Triglyceride is over 400; calculations on Lipids are invalid.   Lab Results  Component Value Date   CHOLHDL 6 05/13/2017   Lab Results  Component Value Date   HGBA1C 7.5 03/02/2017         Assessment & Plan:   Problem List Items Addressed This Visit    Gout    Check uric acid      Relevant Orders   Uric acid (Completed)   Hyperlipidemia    Tolerating statin,  encouraged heart healthy diet, avoid trans fats, minimize simple carbs and saturated fats. Increase exercise as tolerated      Relevant Orders   Lipid panel (Completed)   Essential hypertension    Well controlled, no changes to meds. Encouraged heart healthy diet such as the DASH diet and exercise as tolerated.       Relevant Orders   CBC (Completed)   Comprehensive metabolic panel (Completed)   TSH (Completed)   Hypertriglyceridemia    Encouraged heart healthy diet, increase exercise, avoid trans fats, consider a krill oil cap daily      Lumbar radiculopathy    Has a history of surgery with Dr Ellene Route of neurosurgery in past but is struggling with increased pain and dysfunction recently. Can barely walk. He will get in touch with hsi neurosurgeon to discuss options. Try moist heat and gentle stretching as tolerated. May try NSAIDs and prescription meds as directed and report if symptoms worsen or seek immediate care      CAD in native artery    Was unable to proceed with cardiac rehab due to stress of traveling for daughter's illness.      Obesity    Encouraged DASH diet, decrease po intake and increase exercise as tolerated. Needs 7-8 hours of sleep nightly. Avoid trans fats, eat small, frequent meals every 4-5 hours with lean proteins, complex carbs and healthy fats. Minimize simple carbs, GMO foods.      CKD (chronic kidney disease), stage III    Check cmp         I am having Mr. Doshier maintain his Vitamin D, fluocinonide cream, aspirin EC, acetaminophen, nitroGLYCERIN, fluticasone, insulin regular, B Complex Vitamins (B COMPLEX-B12 PO), albuterol, pantoprazole, tiZANidine, clopidogrel, isosorbide mononitrate, furosemide, insulin NPH Human, BD INSULIN SYRINGE ULTRAFINE, atorvastatin, metoprolol tartrate, tamsulosin, fenofibrate, TRUE METRIX BLOOD GLUCOSE TEST, and allopurinol.  No orders of the defined types were placed in this encounter.   CMA served as Education administrator during  this visit. History, Physical and Plan performed by medical provider. Documentation and orders reviewed and attested to.  Penni Homans, MD

## 2017-05-13 NOTE — Assessment & Plan Note (Signed)
Encouraged heart healthy diet, increase exercise, avoid trans fats, consider a krill oil cap daily 

## 2017-05-13 NOTE — Assessment & Plan Note (Signed)
Encouraged DASH diet, decrease po intake and increase exercise as tolerated. Needs 7-8 hours of sleep nightly. Avoid trans fats, eat small, frequent meals every 4-5 hours with lean proteins, complex carbs and healthy fats. Minimize simple carbs, GMO foods. 

## 2017-05-16 NOTE — Assessment & Plan Note (Signed)
Has a history of surgery with Dr Ellene Route of neurosurgery in past but is struggling with increased pain and dysfunction recently. Can barely walk. He will get in touch with hsi neurosurgeon to discuss options. Try moist heat and gentle stretching as tolerated. May try NSAIDs and prescription meds as directed and report if symptoms worsen or seek immediate care

## 2017-06-09 ENCOUNTER — Encounter: Payer: Self-pay | Admitting: Internal Medicine

## 2017-06-09 ENCOUNTER — Ambulatory Visit (INDEPENDENT_AMBULATORY_CARE_PROVIDER_SITE_OTHER): Payer: Medicare HMO | Admitting: Internal Medicine

## 2017-06-09 VITALS — BP 128/60 | HR 74 | Ht 70.5 in | Wt 267.0 lb

## 2017-06-09 DIAGNOSIS — Z794 Long term (current) use of insulin: Secondary | ICD-10-CM | POA: Diagnosis not present

## 2017-06-09 DIAGNOSIS — IMO0002 Reserved for concepts with insufficient information to code with codable children: Secondary | ICD-10-CM

## 2017-06-09 DIAGNOSIS — E785 Hyperlipidemia, unspecified: Secondary | ICD-10-CM | POA: Diagnosis not present

## 2017-06-09 DIAGNOSIS — E1159 Type 2 diabetes mellitus with other circulatory complications: Secondary | ICD-10-CM | POA: Diagnosis not present

## 2017-06-09 DIAGNOSIS — E1165 Type 2 diabetes mellitus with hyperglycemia: Secondary | ICD-10-CM

## 2017-06-09 LAB — POCT GLYCOSYLATED HEMOGLOBIN (HGB A1C): Hemoglobin A1C: 7.3

## 2017-06-09 NOTE — Progress Notes (Signed)
Patient ID: William Hendrix, male   DOB: 11-16-1940, 76 y.o.   MRN: [id number]  HPI: William Hendrix is a 76 y.o.-year-old male, returning for f/u for DM2, dx 2009, insulin-dependent, uncontrolled, with complications (CAD s/p AMI 2017, CKD stage 3). Last visit 3 mo ago.  Last hemoglobin A1c was: Lab Results  Component Value Date   HGBA1C 7.5 03/02/2017   HGBA1C 7.7 11/26/2016   HGBA1C 6.5 08/28/2016   He is now on: ReliOn insulin: Insulin Before breakfast Before lunch Before dinner  Regular 30-35 30-35 30-35  NPH 45  (using only 50)  We cannot use Metformin 2/2 CKD. We stopped Januvia >> could not afford ($100/3 mo)  We cannot use Levemir >> developed a severe skin reaction We stopped Amaryl 4 mg.  He checks sugars 3x a day - reviewed his log: - am:  158-190 >> 149-226, 269 >> 152-220 - Before lunch: 160-185 >> 141-221, 241 >> 138-201 - before dinner: 160-185 >> 103-201 >> 99, 107, 139-216 - Before bedtime:  87-177 >> n/c >> 131-276 >> 115-253, 325 Lowest sugar was 42 x1, 60 >> 51 x1 >> 103 >> 99,  he does not know whether he has hypoglycemia awareness.  Highest sugar was 171 >> 189 >> 269 >> 325  He has CKD, last BUN/creatinine was:  Lab Results  Component Value Date   BUN 28 (H) 05/13/2017   CREATININE 1.75 (H) 05/13/2017   Latest ACR:. Lab Results  Component Value Date   MICRALBCREAT 5.1 08/02/2014   MICRALBCREAT 0.7 02/23/2013   Latest GFR: Lab Results  Component Value Date   GFRAA 51 (L) 02/26/2017   GFRAA 53 (L) 08/05/2016   GFRAA 48 (L) 08/02/2016   GFRAA 41 (L) 08/01/2016   GFRAA 58 (L) 07/31/2016   GFRAA 56 (L) 07/30/2016   GFRAA 57 (L) 12/15/2014   GFRAA 73 (L) 07/27/2013   GFRAA 67 (L) 05/20/2013   GFRAA 50 (L) 05/19/2013   Last lipids - higher TG: Lab Results  Component Value Date   CHOL 126 05/13/2017   HDL 22.10 (L) 05/13/2017   LDLCALC 31 04/09/2014   LDLDIRECT 56.0 05/13/2017   TRIG (H) 05/13/2017    516.0 Triglyceride is over 400;  calculations on Lipids are invalid.   CHOLHDL 6 05/13/2017  He is on Lipitor 40 mg daily and fenofibrate 160 mg daily. Pt's last eye exam was 09/2015 >> No DR.Had cataract sx in 2011. He denies numbness and tingling in his legs.  He had back surgery in 03/2016.  He was admitted 2x in 2017 for: CP/SOB >> AMI. He had 2 stents placed. He had a cardiac cath 02/2017 >> No new blockages.   PMH: Pt also also has a history of CAD, hypertension, hyperlipidemia, chronic kidney disease stage III, history of nephrolithiasis, obesity, hiatal hernia, obstructive sleep apnea, hyperglyceridemia, melanoma, spinal stenosis.  ROS: Constitutional: no weight gain/no weight loss, no fatigue, no subjective hyperthermia, no subjective hypothermia Eyes: no blurry vision, no xerophthalmia ENT: no sore throat, no nodules palpated in throat, no dysphagia, no odynophagia, no hoarseness Cardiovascular: no CP/no SOB/no palpitations/no leg swelling Respiratory: no cough/no SOB/no wheezing Gastrointestinal: no N/no V/no D/no C/no acid reflux Musculoskeletal: no muscle aches/no joint aches Skin: no rashes, no hair loss Neurological: no tremors/no numbness/no tingling/no dizziness  I reviewed pt's medications, allergies, PMH, social hx, family hx, and changes were documented in the history of present illness. Otherwise, unchanged from my initial visit note.   PE: BP 128/60  Pulse 74   Ht 5' 10.5" (1.791 m)   Wt 267 lb (121.1 kg)   SpO2 95%   BMI 37.77 kg/m  Body mass index is 37.77 kg/m. Wt Readings from Last 3 Encounters:  06/09/17 267 lb (121.1 kg)  05/13/17 268 lb 3.2 oz (121.7 kg)  04/28/17 265 lb (120.2 kg)   Constitutional: overweight, in NAD Eyes: PERRLA, EOMI, no exophthalmos ENT: moist mucous membranes, no thyromegaly, no cervical lymphadenopathy Cardiovascular: RRR, No MRG Respiratory: CTA B Gastrointestinal: abdomen soft, NT, ND, BS+ Musculoskeletal: no deformities, strength intact in all  4 Skin: moist, warm, no rashes Neurological: no tremor with outstretched hands, DTR normal in all 4  ASSESSMENT: 1. DM2, insulin-dependent, uncontrolled, with complications - CAD, s/p stents - He had stenting of distal RCA/PDA in 07/2013. Prior stenting of LAD in 04/2013. 2 new stents: PCI to left circumflex and the mid RCA on 07/30/2016. - AAA - CKD stage 3  2. HL  PLAN:  1. Patient with history of uncontrolled diabetes, on basal-bolus insulin regimen, with improvement of his HbA1c over the last few months. His last HbA1c was 7.5%. We discussed at length in the past about improving his diet, which he is trying to do. - At last visit, as the sugars in the morning are too high, we increased NPH at night at this visit: -  At this visit, sugars are slightly better (higher in last 2 weeks, though - unclear why) - I advised him to: Patient Instructions   Please increase:  Insulin Before breakfast Before lunch Before dinner  Regular 35-40 35-40 35-40  NPH 45  50   Try to reduce animal fat.  Try to start Flax seed oil capsule 2x a week.  Please come back for a follow-up appointment in 3 months.  - today, HbA1c is 7.3% (better)  - continue checking sugars at different times of the day - check 3x a day, rotating checks - advised for yearly eye exams >> he needs one - Return to clinic in 3 mo with sugar log    2. HL - TG elevated on latest Lipid panel: 516 - He is on Lipitor and fenofibrate.  - discussed to cut down animal fat - will also start Flax seed oil capsules 2x a day  [name], MD PhD [facility]

## 2017-06-09 NOTE — Patient Instructions (Addendum)
Please increase:  Insulin Before breakfast Before lunch Before dinner  Regular 35-40 35-40 35-40  NPH 45  50   Try to reduce animal fat.  Try to start Flax seed oil capsule 2x a week.  Please come back for a follow-up appointment in 3 months.

## 2017-06-30 DIAGNOSIS — M5416 Radiculopathy, lumbar region: Secondary | ICD-10-CM | POA: Diagnosis not present

## 2017-06-30 DIAGNOSIS — M5126 Other intervertebral disc displacement, lumbar region: Secondary | ICD-10-CM | POA: Diagnosis not present

## 2017-06-30 DIAGNOSIS — Z6838 Body mass index (BMI) 38.0-38.9, adult: Secondary | ICD-10-CM | POA: Diagnosis not present

## 2017-07-09 DIAGNOSIS — M5126 Other intervertebral disc displacement, lumbar region: Secondary | ICD-10-CM | POA: Diagnosis not present

## 2017-07-12 ENCOUNTER — Other Ambulatory Visit: Payer: Self-pay | Admitting: Cardiology

## 2017-07-12 NOTE — Telephone Encounter (Signed)
REFILL 

## 2017-07-15 ENCOUNTER — Encounter (HOSPITAL_BASED_OUTPATIENT_CLINIC_OR_DEPARTMENT_OTHER): Payer: Self-pay | Admitting: *Deleted

## 2017-07-15 ENCOUNTER — Emergency Department (HOSPITAL_BASED_OUTPATIENT_CLINIC_OR_DEPARTMENT_OTHER)
Admission: EM | Admit: 2017-07-15 | Discharge: 2017-07-15 | Disposition: A | Discharge status: Home / Self Care | Payer: Medicare HMO | Attending: Physician Assistant | Admitting: Physician Assistant

## 2017-07-15 ENCOUNTER — Emergency Department (HOSPITAL_BASED_OUTPATIENT_CLINIC_OR_DEPARTMENT_OTHER): Payer: Medicare HMO

## 2017-07-15 DIAGNOSIS — M48061 Spinal stenosis, lumbar region without neurogenic claudication: Secondary | ICD-10-CM | POA: Diagnosis not present

## 2017-07-15 DIAGNOSIS — M791 Myalgia, unspecified site: Secondary | ICD-10-CM | POA: Insufficient documentation

## 2017-07-15 DIAGNOSIS — R6883 Chills (without fever): Secondary | ICD-10-CM | POA: Diagnosis present

## 2017-07-15 DIAGNOSIS — Z794 Long term (current) use of insulin: Secondary | ICD-10-CM | POA: Insufficient documentation

## 2017-07-15 DIAGNOSIS — I130 Hypertensive heart and chronic kidney disease with heart failure and stage 1 through stage 4 chronic kidney disease, or unspecified chronic kidney disease: Secondary | ICD-10-CM | POA: Diagnosis not present

## 2017-07-15 DIAGNOSIS — Z87891 Personal history of nicotine dependence: Secondary | ICD-10-CM | POA: Diagnosis not present

## 2017-07-15 DIAGNOSIS — I2510 Atherosclerotic heart disease of native coronary artery without angina pectoris: Secondary | ICD-10-CM | POA: Insufficient documentation

## 2017-07-15 DIAGNOSIS — E1140 Type 2 diabetes mellitus with diabetic neuropathy, unspecified: Secondary | ICD-10-CM | POA: Diagnosis not present

## 2017-07-15 DIAGNOSIS — Z7902 Long term (current) use of antithrombotics/antiplatelets: Secondary | ICD-10-CM | POA: Insufficient documentation

## 2017-07-15 DIAGNOSIS — Z79899 Other long term (current) drug therapy: Secondary | ICD-10-CM | POA: Diagnosis not present

## 2017-07-15 DIAGNOSIS — I5032 Chronic diastolic (congestive) heart failure: Secondary | ICD-10-CM | POA: Insufficient documentation

## 2017-07-15 DIAGNOSIS — Z955 Presence of coronary angioplasty implant and graft: Secondary | ICD-10-CM | POA: Diagnosis not present

## 2017-07-15 DIAGNOSIS — R509 Fever, unspecified: Secondary | ICD-10-CM | POA: Insufficient documentation

## 2017-07-15 DIAGNOSIS — I252 Old myocardial infarction: Secondary | ICD-10-CM | POA: Insufficient documentation

## 2017-07-15 DIAGNOSIS — Z7982 Long term (current) use of aspirin: Secondary | ICD-10-CM | POA: Diagnosis not present

## 2017-07-15 DIAGNOSIS — N183 Chronic kidney disease, stage 3 (moderate): Secondary | ICD-10-CM | POA: Diagnosis not present

## 2017-07-15 DIAGNOSIS — Z85828 Personal history of other malignant neoplasm of skin: Secondary | ICD-10-CM | POA: Insufficient documentation

## 2017-07-15 DIAGNOSIS — J984 Other disorders of lung: Secondary | ICD-10-CM | POA: Diagnosis not present

## 2017-07-15 DIAGNOSIS — M5416 Radiculopathy, lumbar region: Secondary | ICD-10-CM | POA: Diagnosis not present

## 2017-07-15 LAB — COMPREHENSIVE METABOLIC PANEL
ALBUMIN: 4.1 g/dL (ref 3.5–5.0)
ALK PHOS: 45 U/L (ref 38–126)
ALT: 26 U/L (ref 17–63)
AST: 36 U/L (ref 15–41)
Anion gap: 7 (ref 5–15)
BILIRUBIN TOTAL: 0.7 mg/dL (ref 0.3–1.2)
BUN: 21 mg/dL — ABNORMAL HIGH (ref 6–20)
CALCIUM: 9.8 mg/dL (ref 8.9–10.3)
CO2: 27 mmol/L (ref 22–32)
CREATININE: 1.47 mg/dL — AB (ref 0.61–1.24)
Chloride: 104 mmol/L (ref 101–111)
GFR calc non Af Amer: 44 mL/min — ABNORMAL LOW (ref >60)
GFR, EST AFRICAN AMERICAN: 51 mL/min — AB (ref >60)
GLUCOSE: 89 mg/dL (ref 65–99)
Potassium: 3.8 mmol/L (ref 3.5–5.1)
SODIUM: 138 mmol/L (ref 135–145)
TOTAL PROTEIN: 7.1 g/dL (ref 6.5–8.1)

## 2017-07-15 LAB — CBG MONITORING, ED
GLUCOSE-CAPILLARY: 101 mg/dL — AB (ref 65–99)
Glucose-Capillary: 89 mg/dL (ref 65–99)

## 2017-07-15 LAB — URINALYSIS, ROUTINE W REFLEX MICROSCOPIC
Bilirubin Urine: NEGATIVE
Glucose, UA: NEGATIVE mg/dL
Hgb urine dipstick: NEGATIVE
KETONES UR: NEGATIVE mg/dL
Leukocytes, UA: NEGATIVE
Nitrite: NEGATIVE
PH: 7 (ref 5.0–8.0)
Protein, ur: NEGATIVE mg/dL
Specific Gravity, Urine: 1.01 (ref 1.005–1.030)

## 2017-07-15 LAB — LIPASE, BLOOD: Lipase: 32 U/L (ref 11–51)

## 2017-07-15 LAB — CBC WITH DIFFERENTIAL/PLATELET
BASOS PCT: 0 %
Basophils Absolute: 0 10*3/uL (ref 0.0–0.1)
EOS ABS: 0.1 10*3/uL (ref 0.0–0.7)
Eosinophils Relative: 1 %
HCT: 39.7 % (ref 39.0–52.0)
Hemoglobin: 13.5 g/dL (ref 13.0–17.0)
Lymphocytes Relative: 9 %
Lymphs Abs: 0.5 10*3/uL — ABNORMAL LOW (ref 0.7–4.0)
MCH: 31.9 pg (ref 26.0–34.0)
MCHC: 34 g/dL (ref 30.0–36.0)
MCV: 93.9 fL (ref 78.0–100.0)
MONO ABS: 0.2 10*3/uL (ref 0.1–1.0)
MONOS PCT: 4 %
Neutro Abs: 5.2 10*3/uL (ref 1.7–7.7)
Neutrophils Relative %: 86 %
PLATELETS: 149 10*3/uL — AB (ref 150–400)
RBC: 4.23 MIL/uL (ref 4.22–5.81)
RDW: 13.9 % (ref 11.5–15.5)
WBC: 6 10*3/uL (ref 4.0–10.5)

## 2017-07-15 LAB — I-STAT CG4 LACTIC ACID, ED: LACTIC ACID, VENOUS: 2.47 mmol/L — AB (ref 0.5–1.9)

## 2017-07-15 LAB — TROPONIN I: Troponin I: <0.03 ng/mL (ref <0.03)

## 2017-07-15 LAB — INFLUENZA PANEL BY PCR (TYPE A & B)
INFLBPCR: NEGATIVE
Influenza A By PCR: NEGATIVE

## 2017-07-15 MED ORDER — OSELTAMIVIR PHOSPHATE 75 MG PO CAPS: 75.0000 mg | ORAL_CAPSULE | Freq: Two times a day (BID) | ORAL | 0 refills | Status: DC

## 2017-07-15 MED ORDER — ACETAMINOPHEN 500 MG PO TABS
1000.0000 mg | ORAL_TABLET | Freq: Once | ORAL | Status: AC
  Administered 2017-07-15: 1000 mg via ORAL
  Filled 2017-07-15: qty 2

## 2017-07-15 MED ORDER — SODIUM CHLORIDE 0.9 % IV BOLUS (SEPSIS)
1000.0000 mL | Freq: Once | INTRAVENOUS | Status: AC
  Administered 2017-07-15: 1000 mL via INTRAVENOUS

## 2017-07-15 NOTE — ED Provider Notes (Signed)
[facility] DEPT [facility] Provider Note   CSN: [id number] Arrival date & time: 07/15/17  1446     History   Chief Complaint Chief Complaint  Patient presents with  . Weakness    HPI William Hendrix is a 76 y.o. male.  HPI   PT is a 76 yo Male presenting with feeling of chills. Patient has a lot of chronic issues, including CAD, fibromyalgia, peripheral neuropathy, chronic lower back pain, chronic neck pain, type 2 diabetes hypertension hyperlipidemia. Patient had MRI of his back this morning. He reports he's got some pain in his back and shoulders after the MRI. He mostly reports feelings of chills and body aches.  Denies urinary symptoms or cough symptoms. Patient has not received flu shot this year.  Patient has mild nausea. No vomiting. Past Medical History:  Diagnosis Date  . AAA (abdominal aortic aneurysm) ([place]) 12/15/2014   a. Mild aneurysmal dilatation of the infrarenal abdominal aorta 3.2 cm - f/u due by 2019.  . Arthritis    "all over" (07/30/2016)  . CAD (coronary artery disease)    a. stent to LAD 2000. b. possible spasm by cath 2001. c. IVUS/PTCA/DES to mLAD 05/2013. d. PTCA/DES of dRCA into ostial rPDA 07/2013. e. PTCA of OM2, DES to Cx, DES to [facility] 07/2016.  Marland Kitchen Chronic bronchitis ([place])   . Chronic diastolic CHF (congestive heart failure) ([place])   . Chronic lower back pain   . Chronic neck pain   . CKD (chronic kidney disease), stage III ([place])   . Diabetic peripheral neuropathy ([place])   . Ejection fraction    55%, 07/2010, mild inferior hypo  . GERD (gastroesophageal reflux disease)   . Gout   . H/O diverticulitis of colon 01/31/2015  . H/O hiatal hernia   . History of blood transfusion ~ 10/1940   "had pneumonia"  . HTN (hypertension)   . Hyperlipidemia   . Melanoma of forearm, left ([place]) 02/2011   with wide excision   . Nephrolithiasis   . Obesity   . OSA on CPAP     [name] since 2000  . Positive D-dimer    a. significant elevation ,hospital 07/2010,  etiology unclear. b. D Dimer chronically > 20.  . Skin cancer   . Spinal stenosis    a. s/p surgical repair 2013  . Type II diabetes mellitus ([place])   . Ventral hernia     Patient Active Problem List   Diagnosis Date Noted  . Right-sided low back pain with right-sided sciatica 11/22/2016  . Pressure injury of skin 08/03/2016  . Dyspnea 08/01/2016  . CKD (chronic kidney disease), stage III ([place]) 07/31/2016  . NSTEMI (non-ST elevated myocardial infarction) ([place]) 07/30/2016  . CAD in native artery   . Obesity   . Uncontrolled type 2 diabetes mellitus with circulatory disorder, with long-term current use of insulin ([place]) 11/28/2015  . Lumbar radiculopathy 10/09/2015  . Neuropathy, diabetic ([place]) 05/22/2015  . H/O diverticulitis of colon 01/31/2015  . AAA (abdominal aortic aneurysm) ([place]) 01/01/2015  . Sinusitis 10/10/2014  . Hip pain, bilateral 07/05/2014  . Palpitations 02/27/2014  . Claudication of lower extremity ([place]) 10/25/2013  . BPH with obstruction/lower urinary tract symptoms 10/25/2013  . Hypertriglyceridemia 08/15/2012  . Melanoma ([place])   . Dizziness   . Renal insufficiency   . Coronary artery disease with exertional angina ([place])   . Hyperlipidemia   . Essential hypertension   . OSA (obstructive sleep apnea)   . Ejection fraction   .  SOB (shortness of breath)   . PARESTHESIA 05/30/2009  . EDEMA 01/22/2009  . OTHER ABNORMAL BLOOD CHEMISTRY 04/25/2008  . Gout 10/21/2007  . DEPRESSION 10/21/2007  . VENTRAL HERNIA 10/21/2007  . HIATAL HERNIA 10/21/2007  . FATIGUE 10/21/2007  . SKIN CANCER, HX OF 10/21/2007  . NEPHROLITHIASIS, HX OF 10/21/2007    Past Surgical History:  Procedure Laterality Date  . BACK SURGERY    . CARDIAC CATHETERIZATION N/A 07/30/2016   Procedure: Left Heart Cath and Coronary Angiography;  Surgeon: Nelva Bush, MD;  Location: [facility] CV LAB;  Service: Cardiovascular;  Laterality: N/A;  . CARDIAC CATHETERIZATION N/A 07/30/2016    Procedure: Coronary Stent Intervention;  Surgeon: Nelva Bush, MD;  Location: [facility] LAB;  Service: Cardiovascular;  Laterality: N/A;  Mid CFX and MID RCA  . CARDIAC CATHETERIZATION N/A 07/30/2016   Procedure: Coronary Balloon Angioplasty;  Surgeon: Nelva Bush, MD;  Location: [facility] LAB;  Service: Cardiovascular;  Laterality: N/A;  OM 1  . CARPAL TUNNEL RELEASE Left   . CATARACT EXTRACTION W/ INTRAOCULAR LENS  IMPLANT, BILATERAL Bilateral ~ 2014  . CERVICAL DISC SURGERY  1990s  . CORONARY ANGIOPLASTY WITH STENT PLACEMENT  05/2013  . CORONARY ANGIOPLASTY WITH STENT PLACEMENT  07/26/2013   DES to RCA extending to PDA    . CORONARY ANGIOPLASTY WITH STENT PLACEMENT  07/30/2016  . CORONARY ANGIOPLASTY WITH STENT PLACEMENT  2000   CAD  . DENTAL SURGERY  04/2016   "got infected; had to dig it out"  . KNEE ARTHROSCOPY Right 1990's   rt  . LEFT AND RIGHT HEART CATHETERIZATION WITH CORONARY ANGIOGRAM N/A 07/26/2013   Procedure: LEFT AND RIGHT HEART CATHETERIZATION WITH CORONARY ANGIOGRAM;  Surgeon: [name], MD;  Location: [facility] CATH LAB;  Service: Cardiovascular;  Laterality: N/A;  . LEFT HEART CATH AND CORONARY ANGIOGRAPHY N/A 02/26/2017   Procedure: Left Heart Cath and Coronary Angiography;  Surgeon: Sherren Mocha, MD;  Location: [facility] CV LAB;  Service: Cardiovascular;  Laterality: N/A;  . LEFT HEART CATHETERIZATION WITH CORONARY ANGIOGRAM N/A 05/19/2013   Procedure: LEFT HEART CATHETERIZATION WITH CORONARY ANGIOGRAM;  Surgeon: [name], MD;  Location: [facility] CATH LAB;  Service: Cardiovascular;  Laterality: N/A;  . LUMBAR LAMINECTOMY/DECOMPRESSION MICRODISCECTOMY  08/30/2012   Procedure: LUMBAR LAMINECTOMY/DECOMPRESSION MICRODISCECTOMY 2 LEVELS;  Surgeon: [name], MD;  Location: [facility] NEURO ORS;  Service: Neurosurgery;  Laterality: Bilateral;  Bilateral Lumbar three-four Lumbar four-five Laminotomies  . LUMBAR SPINE SURGERY  04/07/2016   Dr. Ellene Route;  "?ruptured disc"  . MELANOMA EXCISION Left 02/2011   forearm  . NASAL SINUS SURGERY  1970s   "cut windows in sinus pockets"  . PERCUTANEOUS CORONARY STENT INTERVENTION (PCI-S)  05/19/2013   Procedure: PERCUTANEOUS CORONARY STENT INTERVENTION (PCI-S);  Surgeon: [name], MD;  Location: [facility] CATH LAB;  Service: Cardiovascular;;  . ULNAR TUNNEL RELEASE Left 04/07/2016   Dr. Ellene Route       Home Medications    Prior to Admission medications   Medication Sig Start Date End Date Taking? Authorizing Provider  acetaminophen (TYLENOL) 500 MG tablet Take 1,000 mg by mouth 2 (two) times daily.    [provider]  albuterol (PROVENTIL HFA;VENTOLIN HFA) 108 (90 Base) MCG/ACT inhaler Inhale 2 puffs into the lungs every 6 (six) hours as needed for wheezing or shortness of breath. 10/30/16   Saguier, Percell Miller, PA-C  allopurinol (ZYLOPRIM) 300 MG tablet TAKE 1 TABLET EVERY DAY 05/11/17   [name], MD  aspirin EC 81 MG tablet Take 81 mg by mouth daily. 02/23/11   [name], MD  atorvastatin (LIPITOR) 40 MG tablet Take 1 tablet (40 mg total) by mouth daily. 03/29/17   [name], MD  B Complex Vitamins (B COMPLEX-B12 PO) Take 1 tablet by mouth at bedtime. 07/23/16   [provider]  BD INSULIN SYRINGE ULTRAFINE 31G X 5/16" 1 ML MISC USE  TO INJECT  INSULIN  FIVE  TIMES  DAILY AS INSTRUCTED 03/17/17   [name], MD  Cholecalciferol (VITAMIN D) 1000 UNITS capsule Take 1,000 Units by mouth at bedtime.     [provider]  clopidogrel (PLAVIX) 75 MG tablet Take 1 tablet (75 mg total) by mouth daily. 02/24/17   Lelon Perla, MD  fenofibrate 160 MG tablet Take 160 mg by mouth daily.    [provider]  fluocinonide cream (LIDEX) 6.29 % Apply 1 application topically daily as needed. FOR RASH 08/11/12   [provider]  fluticasone (FLONASE) 50 MCG/ACT nasal spray Place 2 sprays into both nostrils daily. Patient taking differently: Place 2 sprays  into both nostrils as needed for allergies.  10/10/14   Saguier, Percell Miller, PA-C  furosemide (LASIX) 40 MG tablet TAKE 1 AND 1/2 TABLETS EVERY DAY 07/12/17   Lelon Perla, MD  insulin NPH Human (NOVOLIN N) 100 UNIT/ML injection Inject 45 units in am and 55 in the evening 03/02/17   [name], MD  insulin regular (NOVOLIN R RELION) 100 units/mL injection Inject 0.25-0.3 mLs (25-30 Units total) into the skin 3 (three) times daily before meals. Patient taking differently: Inject 35 Units into the skin 3 (three) times daily before meals.  11/28/15   [name], MD  isosorbide mononitrate (IMDUR) 30 MG 24 hr tablet Take 1 tablet (30 mg total) by mouth daily. 03/02/17   Lelon Perla, MD  metoprolol tartrate (LOPRESSOR) 50 MG tablet Take 1 tablet (50 mg total) by mouth 2 (two) times daily. 03/29/17   [name], MD  nitroGLYCERIN (NITROSTAT) 0.4 MG SL tablet Place 1 tablet (0.4 mg total) under the tongue every 5 (five) minutes as needed for chest pain. 07/05/14   [name], MD  pantoprazole (PROTONIX) 20 MG tablet Take 1 tablet (20 mg total) by mouth daily. 02/09/17   [name], MD  tamsulosin (FLOMAX) 0.4 MG CAPS capsule Take 1 capsule (0.4 mg total) by mouth daily. 03/29/17   [name], MD  tiZANidine (ZANAFLEX) 4 MG capsule Take 1 capsule (4 mg total) by mouth 4 (four) times daily as needed for muscle spasms. Take 1 tablet every 6 hours as needed; not to exceed 3 doses in 24 hours. 02/09/17   [name], MD  TRUE METRIX BLOOD GLUCOSE TEST test strip USE TO TEST BLOOD SUGAR THREE TIMES DAILY AS DIRECTED 04/29/17   [name], MD    Family History Family History  Problem Relation Age of Onset  . Cancer Mother        intestinal   . Heart disease Mother   . Cancer Father        prostate  . Migraines Daughter   . Leukemia Sister   . Heart disease Sister   . Prostate cancer Unknown   . Kidney cancer Unknown   . Cancer Unknown        Bladder cancer  .  Coronary artery disease Unknown   . Hypertension Sister   . Hypertension Brother   .  Heart attack Neg Hx   . Stroke Neg Hx     Social History Social History  Substance Use Topics  . Smoking status: Former Smoker    Packs/day: 3.00    Years: 30.00    Types: Cigarettes    Quit date: 09/17/1996  . Smokeless tobacco: Never Used  . Alcohol use No     Allergies   Levemir [insulin detemir]; Codeine; Lipitor [atorvastatin]; Novolog mix [insulin aspart prot & aspart]; and Other   Review of Systems Review of Systems  Constitutional: Positive for appetite change, chills and fatigue. Negative for activity change.  Respiratory: Negative for shortness of breath.   Cardiovascular: Negative for chest pain.  Gastrointestinal: Positive for nausea. Negative for abdominal pain and vomiting.  Musculoskeletal: Positive for back pain and myalgias. Negative for gait problem and neck stiffness.     Physical Exam Updated Vital Signs BP (!) 123/48   Pulse (!) 105   Temp (!) 102.1 F (38.9 C) (Rectal)   Resp 18   Ht 5' 10.5" (1.791 m)   Wt 119.3 kg (263 lb)   SpO2 92%   BMI 37.20 kg/m   Physical Exam  Constitutional: He is oriented to person, place, and time. He appears well-nourished.  HENT:  Head: Normocephalic.  Eyes: Conjunctivae are normal. Right eye exhibits no discharge. Left eye exhibits no discharge.  Neck:  No signs of meningismus.  Cardiovascular: Normal rate and regular rhythm.   No murmur heard. Pulmonary/Chest: Effort normal and breath sounds normal. No respiratory distress. He has no wheezes.  Abdominal: Soft. He exhibits no distension. There is no tenderness.  Musculoskeletal: Normal range of motion. He exhibits no edema.  Neurological: He is oriented to person, place, and time. No cranial nerve deficit.  Skin: Skin is warm and dry. He is not diaphoretic.  Psychiatric: He has a normal mood and affect. His behavior is normal.     ED Treatments / Results  Labs (all  labs ordered are listed, but only abnormal results are displayed) Labs Reviewed  COMPREHENSIVE METABOLIC PANEL - Abnormal; Notable for the following:       Result Value   BUN 21 (*)    Creatinine, Ser 1.47 (*)    GFR calc non Af Amer 44 (*)    GFR calc Af Amer 51 (*)    All other components within normal limits  CBC WITH DIFFERENTIAL/PLATELET - Abnormal; Notable for the following:    Platelets 149 (*)    Lymphs Abs 0.5 (*)    All other components within normal limits  I-STAT CG4 LACTIC ACID, ED - Abnormal; Notable for the following:    Lactic Acid, Venous 2.47 (*)    All other components within normal limits  CBG MONITORING, ED - Abnormal; Notable for the following:    Glucose-Capillary 101 (*)    All other components within normal limits  TROPONIN I  LIPASE, BLOOD  URINALYSIS, ROUTINE W REFLEX MICROSCOPIC  INFLUENZA PANEL BY PCR (TYPE A & B)  CBG MONITORING, ED    EKG  EKG Interpretation  Date/Time:  Thursday July 15 2017 14:57:38 EDT Ventricular Rate:  90 PR Interval:    QRS Duration: 92 QT Interval:  359 QTC Calculation: 440 R Axis:   -70 Text Interpretation:  Sinus rhythm Normal sinus rhythm Confirmed by Thomasene Lot, [place] [phone]) on 07/15/2017 3:31:44 PM       Radiology Dg Chest 2 View  Result Date: 07/15/2017 CLINICAL DATA:  Generalize weakness, chills, and body aches after  a MRI of the back this morning. History of coronary artery disease, CHF, peripheral vascular disease, and fibromyalgia. EXAM: CHEST  2 VIEW COMPARISON:  PA and lateral chest x-ray of October 30, 2016 FINDINGS: The lungs are adequately inflated. There is no focal infiltrate. There is no pleural effusion. Coronary artery stents are visible. The cardiac silhouette is top-normal in size. The pulmonary vascularity is normal. There is biapical pleural thickening. The bony thorax exhibits no acute abnormality. IMPRESSION: There is no acute pneumonia nor CHF. Mild chronic bronchitic changes, stable.  Electronically Signed   By: David  Martinique M.D.   On: 07/15/2017 15:37    Procedures Procedures (including critical care time)  Medications Ordered in ED Medications  sodium chloride 0.9 % bolus 1,000 mL (0 mLs Intravenous Stopped 07/15/17 1713)  acetaminophen (TYLENOL) tablet 1,000 mg (1,000 mg Oral Given 07/15/17 1542)     Initial Impression / Assessment and Plan / ED Course  I have reviewed the triage vital signs and the nursing notes.  Pertinent labs & imaging results that were available during my care of the patient were reviewed by me and considered in my medical decision making (see chart for details).     PT is a 76 yo Male presenting with feeling of chills. Patient has a lot of chronic issues, including CAD, fibromyalgia, peripheral neuropathy, chronic lower back pain, chronic neck pain, type 2 diabetes hypertension hyperlipidemia. Patient had MRI of his back this morning. He reports he's got some pain in his back and shoulders after the MRI. He mostly reports feelings of chills and body aches.  Denies urinary symptoms or cough symptoms. Patient has not received flu shot this year.  Patient has mild nausea. No vomiting.  6:52 PM Patient has nonspecific complaints of feeling unwell all over. Patient has perfectly normal vital signs and physical exam is reassuring. Suspect the patient has viral illness. We'll get urine, chest x-ray. No fever right now.  6:52 PM There was 103 rectally. No source of infection, negative chest x-ray negative UA. I think is likely represents the flu. Influenza sent but will not be back in a timely fashion. Patient is eating and drinking normally, with normal vital signs currently. Patient centered discussion had with family at bedside. We could admit with broad-spectrum antibiotics versus that this is likely the flu sent home with Tamiflu and have follow-up with a primary care in the next 24-48 hours  Despite high fevers, patient has normal labs, and  appears very well.  . Patient's family lives close by and thinks it safe to go home and will return with any concerns.  Final Clinical Impressions(s) / ED Diagnoses   Final diagnoses:  None    New Prescriptions New Prescriptions   No medications on file     Macarthur Critchley, MD 07/15/17 [phone]

## 2017-07-15 NOTE — ED Triage Notes (Addendum)
Generalized weakness after having MRI of his back this am. Chills. Alert, oriented. Pain in his back and shoulders.

## 2017-07-15 NOTE — Discharge Instructions (Signed)
Please return immediately if patient's unable to keep his Tylenol down, has high fevers that are unable to break, has any other symptoms that are concerning.

## 2017-07-21 DIAGNOSIS — I10 Essential (primary) hypertension: Secondary | ICD-10-CM | POA: Diagnosis not present

## 2017-07-21 DIAGNOSIS — Z6837 Body mass index (BMI) 37.0-37.9, adult: Secondary | ICD-10-CM | POA: Diagnosis not present

## 2017-08-05 DIAGNOSIS — M4802 Spinal stenosis, cervical region: Secondary | ICD-10-CM | POA: Diagnosis not present

## 2017-08-11 ENCOUNTER — Encounter: Payer: Self-pay | Admitting: Medical

## 2017-08-11 ENCOUNTER — Ambulatory Visit (INDEPENDENT_AMBULATORY_CARE_PROVIDER_SITE_OTHER): Payer: Medicare HMO | Admitting: Medical

## 2017-08-11 VITALS — BP 115/53 | HR 74 | Temp 98.9°F | Resp 16 | Ht 70.0 in | Wt 260.8 lb

## 2017-08-11 DIAGNOSIS — R5383 Other fatigue: Secondary | ICD-10-CM | POA: Diagnosis not present

## 2017-08-11 DIAGNOSIS — J40 Bronchitis, not specified as acute or chronic: Secondary | ICD-10-CM

## 2017-08-11 DIAGNOSIS — R05 Cough: Secondary | ICD-10-CM

## 2017-08-11 DIAGNOSIS — R059 Cough, unspecified: Secondary | ICD-10-CM

## 2017-08-11 DIAGNOSIS — J0100 Acute maxillary sinusitis, unspecified: Secondary | ICD-10-CM | POA: Diagnosis not present

## 2017-08-11 DIAGNOSIS — M791 Myalgia, unspecified site: Secondary | ICD-10-CM | POA: Diagnosis not present

## 2017-08-11 DIAGNOSIS — J111 Influenza due to unidentified influenza virus with other respiratory manifestations: Secondary | ICD-10-CM

## 2017-08-11 MED ORDER — FLUTICASONE PROPIONATE 50 MCG/ACT NA SUSP: 2.0000 | Freq: Every day | NASAL | 1 refills | Status: DC

## 2017-08-11 MED ORDER — BENZONATATE 100 MG PO CAPS: 100.0000 mg | ORAL_CAPSULE | Freq: Three times a day (TID) | ORAL | 0 refills | Status: DC | PRN

## 2017-08-11 MED ORDER — DOXYCYCLINE HYCLATE 100 MG PO TABS: 100.0000 mg | ORAL_TABLET | Freq: Two times a day (BID) | ORAL | 0 refills | Status: DC

## 2017-08-11 MED ORDER — OSELTAMIVIR PHOSPHATE 75 MG PO CAPS
75.0000 mg | ORAL_CAPSULE | Freq: Two times a day (BID) | ORAL | 0 refills | Status: DC
Start: 2017-08-11 — End: 2017-08-26

## 2017-08-11 MED FILL — FLUTICASONE PROP 50 MCG SPR: 50 | 30 days supply | Qty: 16 | Fill #0

## 2017-08-11 MED FILL — BENZONATATE 100 MG CAPSULE: 100 | 7 days supply | Qty: 21 | Fill #0

## 2017-08-11 MED FILL — DOXYCYCLINE HYCLATE 100 MG: 100 | 10 days supply | Qty: 20 | Fill #0

## 2017-08-11 MED FILL — OSELTAMIVIR PHOS 75 MG CAP: 75 | 5 days supply | Qty: 10 | Fill #0

## 2017-08-11 NOTE — Progress Notes (Signed)
Subjective:    Patient ID: William Hendrix, male    DOB: 1941-05-20, 76 y.o.   MRN: [id number]  HPI  Pt in for some recent nasal congestion, body aches, cough and chest congestion. Pt states symptoms started Monday night acutely.   Pt has some moderate diffuse achiness. He has chronic severe neck pain and may get surgery in near future.   Pt did not get flu vaccine.   Review of Systems  Constitutional: Positive for diaphoresis and fatigue. Negative for chills and fever.  HENT: Positive for congestion and rhinorrhea. Negative for sinus pain, sinus pressure, sneezing and sore throat.        Sweating at night some. Not presently.  Respiratory: Negative for cough, choking, shortness of breath and wheezing.        Coughing but not keeping him up.  Cardiovascular: Negative for chest pain and palpitations.       Chest congestion and coughing up mucous.  Gastrointestinal: Negative for abdominal pain.  Musculoskeletal: Positive for myalgias and neck pain. Negative for arthralgias, back pain and neck stiffness.  Skin: Negative for rash.  Neurological: Negative for dizziness, seizures, weakness and light-headedness.  Hematological: Negative for adenopathy. Does not bruise/bleed easily.  Psychiatric/Behavioral: Negative for behavioral problems, confusion, hallucinations, self-injury and suicidal ideas. The patient is not nervous/anxious.    Past Medical History:  Diagnosis Date  . AAA (abdominal aortic aneurysm) ([place]) 12/15/2014   a. Mild aneurysmal dilatation of the infrarenal abdominal aorta 3.2 cm - f/u due by 2019.  . Arthritis    "all over" (07/30/2016)  . CAD (coronary artery disease)    a. stent to LAD 2000. b. possible spasm by cath 2001. c. IVUS/PTCA/DES to mLAD 05/2013. d. PTCA/DES of dRCA into ostial rPDA 07/2013. e. PTCA of OM2, DES to Cx, DES to [facility] 07/2016.  Marland Kitchen Chronic bronchitis ([place])   . Chronic diastolic CHF (congestive heart failure) ([place])   . Chronic lower back pain   .  Chronic neck pain   . CKD (chronic kidney disease), stage III ([place])   . Diabetic peripheral neuropathy ([place])   . Ejection fraction    55%, 07/2010, mild inferior hypo  . GERD (gastroesophageal reflux disease)   . Gout   . H/O diverticulitis of colon 01/31/2015  . H/O hiatal hernia   . History of blood transfusion ~ 10/1940   "had pneumonia"  . HTN (hypertension)   . Hyperlipidemia   . Melanoma of forearm, left ([place]) 02/2011   with wide excision   . Nephrolithiasis   . Obesity   . OSA on CPAP     [name] since 2000  . Positive D-dimer    a. significant elevation ,hospital 07/2010, etiology unclear. b. D Dimer chronically > 20.  . Skin cancer   . Spinal stenosis    a. s/p surgical repair 2013  . Type II diabetes mellitus ([place])   . Ventral hernia      Social History   Social History  . Marital status: Married    Spouse name: N/A  . Number of children: N/A  . Years of education: N/A   Occupational History  . [employer]    Social History Main Topics  . Smoking status: Former Smoker    Packs/day: 3.00    Years: 30.00    Types: Cigarettes    Quit date: 09/17/1996  . Smokeless tobacco: Never Used  . Alcohol use No  . Drug use: No  . Sexual activity: Not Currently  Birth control/ protection: None   Other Topics Concern  . Not on file   Social History Narrative   Married and lives locally with his wife.  [employer].    Past Surgical History:  Procedure Laterality Date  . BACK SURGERY    . CARDIAC CATHETERIZATION N/A 07/30/2016   Procedure: Left Heart Cath and Coronary Angiography;  Surgeon: Nelva Bush, MD;  Location: [place] [facility];  Service: Cardiovascular;  Laterality: N/A;  . CARDIAC CATHETERIZATION N/A 07/30/2016   Procedure: Coronary Stent Intervention;  Surgeon: Nelva Bush, MD;  Location: [place] [facility];  Service: Cardiovascular;  Laterality: N/A;  Mid CFX and MID RCA  . CARDIAC CATHETERIZATION N/A 07/30/2016   Procedure: Coronary Balloon  Angioplasty;  Surgeon: Nelva Bush, MD;  Location: [place] [facility];  Service: Cardiovascular;  Laterality: N/A;  OM 1  . CARPAL TUNNEL RELEASE Left   . CATARACT EXTRACTION W/ INTRAOCULAR LENS  IMPLANT, BILATERAL Bilateral ~ 2014  . CERVICAL DISC SURGERY  1990s  . CORONARY ANGIOPLASTY WITH STENT PLACEMENT  05/2013  . CORONARY ANGIOPLASTY WITH STENT PLACEMENT  07/26/2013   DES to RCA extending to PDA    . CORONARY ANGIOPLASTY WITH STENT PLACEMENT  07/30/2016  . CORONARY ANGIOPLASTY WITH STENT PLACEMENT  2000   CAD  . DENTAL SURGERY  04/2016   "got infected; had to dig it out"  . KNEE ARTHROSCOPY Right 1990's   rt  . LEFT AND RIGHT HEART CATHETERIZATION WITH CORONARY ANGIOGRAM N/A 07/26/2013   Procedure: LEFT AND RIGHT HEART CATHETERIZATION WITH CORONARY ANGIOGRAM;  Surgeon: [name], MD;  Location: [place] [facility] LAB;  Service: Cardiovascular;  Laterality: N/A;  . LEFT HEART CATH AND CORONARY ANGIOGRAPHY N/A 02/26/2017   Procedure: Left Heart Cath and Coronary Angiography;  Surgeon: Sherren Mocha, MD;  Location: [place] [facility];  Service: Cardiovascular;  Laterality: N/A;  . LEFT HEART CATHETERIZATION WITH CORONARY ANGIOGRAM N/A 05/19/2013   Procedure: LEFT HEART CATHETERIZATION WITH CORONARY ANGIOGRAM;  Surgeon: [name], MD;  Location: [facility] C[facility];  Service: Cardiovascular;  Laterality: N/A;  . LUMBAR LAMINECTOMY/DECOMPRESSION MICRODISCECTOMY  08/30/2012   Procedure: LUMBAR LAMINECTOMY/DECOMPRESSION MICRODISCECTOMY 2 LEVELS;  Surgeon: [name], MD;  Location: [place] [facility];  Service: Neurosurgery;  Laterality: Bilateral;  Bilateral Lumbar three-four Lumbar four-five Laminotomies  . LUMBAR SPINE SURGERY  04/07/2016   Dr. Ellene Route; "?ruptured disc"  . MELANOMA EXCISION Left 02/2011   forearm  . NASAL SINUS SURGERY  1970s   "cut windows in sinus pockets"  . PERCUTANEOUS CORONARY STENT INTERVENTION (PCI-S)  05/19/2013   Procedure: PERCUTANEOUS CORONARY STENT INTERVENTION  (PCI-S);  Surgeon: [name], MD;  Location: [facility] CATH LAB;  Service: Cardiovascular;;  . ULNAR TUNNEL RELEASE Left 04/07/2016   Dr. Ellene Route    Family History  Problem Relation Age of Onset  . Cancer Mother        intestinal   . Heart disease Mother   . Cancer Father        prostate  . Migraines Daughter   . Leukemia Sister   . Heart disease Sister   . Prostate cancer Unknown   . Kidney cancer Unknown   . Cancer Unknown        Bladder cancer  . Coronary artery disease Unknown   . Hypertension Sister   . Hypertension Brother   . Heart attack Neg Hx   . Stroke Neg Hx     Allergies  Allergen Reactions  . Levemir [Insulin Detemir]  Swelling    Patient had redness and swelling and tenderness at injection site.  . Codeine Other (See Comments)    Makes him "shakey", is OK with hydrocodone GI UPSET & TREMORS  . Lipitor [Atorvastatin] Other (See Comments)    Muscle aches; liver functions. Patient is currently taken  . Novolog Mix [Insulin Aspart Prot & Aspart] Other (See Comments)    Causes skin to swell/itch at injection site  . Other Other (See Comments)    Brilinta caused SOB    Current Outpatient Prescriptions on File Prior to Visit  Medication Sig Dispense Refill  . acetaminophen (TYLENOL) 500 MG tablet Take 1,000 mg by mouth 2 (two) times daily.    Marland Kitchen albuterol (PROVENTIL HFA;VENTOLIN HFA) 108 (90 Base) MCG/ACT inhaler Inhale 2 puffs into the lungs every 6 (six) hours as needed for wheezing or shortness of breath. 1 Inhaler 0  . allopurinol (ZYLOPRIM) 300 MG tablet TAKE 1 TABLET EVERY DAY 90 tablet 1  . aspirin EC 81 MG tablet Take 81 mg by mouth daily.    Marland Kitchen atorvastatin (LIPITOR) 40 MG tablet Take 1 tablet (40 mg total) by mouth daily. 90 tablet 1  . B Complex Vitamins (B COMPLEX-B12 PO) Take 1 tablet by mouth at bedtime.    . BD INSULIN SYRINGE ULTRAFINE 31G X 5/16" 1 ML MISC USE  TO INJECT  INSULIN  FIVE  TIMES  DAILY AS INSTRUCTED 450 each 3  . Cholecalciferol  (VITAMIN D) 1000 UNITS capsule Take 1,000 Units by mouth at bedtime.     . clopidogrel (PLAVIX) 75 MG tablet Take 1 tablet (75 mg total) by mouth daily. 90 tablet 3  . fenofibrate 160 MG tablet Take 160 mg by mouth daily.    . fluocinonide cream (LIDEX) 9.37 % Apply 1 application topically daily as needed. FOR RASH    . fluticasone (FLONASE) 50 MCG/ACT nasal spray Place 2 sprays into both nostrils daily. (Patient taking differently: Place 2 sprays into both nostrils as needed for allergies. ) 16 g 1  . furosemide (LASIX) 40 MG tablet TAKE 1 AND 1/2 TABLETS EVERY DAY 135 tablet 2  . insulin NPH Human (NOVOLIN N) 100 UNIT/ML injection Inject 45 units in am and 55 in the evening 10 mL   . insulin regular (NOVOLIN R RELION) 100 units/mL injection Inject 0.25-0.3 mLs (25-30 Units total) into the skin 3 (three) times daily before meals. (Patient taking differently: Inject 35 Units into the skin 3 (three) times daily before meals. ) 70 mL 1  . isosorbide mononitrate (IMDUR) 30 MG 24 hr tablet Take 1 tablet (30 mg total) by mouth daily. 90 tablet 3  . metoprolol tartrate (LOPRESSOR) 50 MG tablet Take 1 tablet (50 mg total) by mouth 2 (two) times daily. 180 tablet 1  . nitroGLYCERIN (NITROSTAT) 0.4 MG SL tablet Place 1 tablet (0.4 mg total) under the tongue every 5 (five) minutes as needed for chest pain. 25 tablet 3  . pantoprazole (PROTONIX) 20 MG tablet Take 1 tablet (20 mg total) by mouth daily. 90 tablet 3  . tamsulosin (FLOMAX) 0.4 MG CAPS capsule Take 1 capsule (0.4 mg total) by mouth daily. 90 capsule 1  . tiZANidine (ZANAFLEX) 4 MG capsule Take 1 capsule (4 mg total) by mouth 4 (four) times daily as needed for muscle spasms. Take 1 tablet every 6 hours as needed; not to exceed 3 doses in 24 hours. 40 capsule 2  . TRUE METRIX BLOOD GLUCOSE TEST test strip USE TO  TEST BLOOD SUGAR THREE TIMES DAILY AS DIRECTED 300 each 3   No current facility-administered medications on file prior to visit.     BP  (!) 115/53   Pulse 74   Temp 98.9 F (37.2 C) (Oral)   Resp 16   Ht 5\' 10"  (1.778 m)   Wt 260 lb 12.8 oz (118.3 kg)   SpO2 99%   BMI 37.42 kg/m       Objective:   Physical Exam   General  Mental Status - Alert. General Appearance - Well groomed. Not in acute distress.  Skin Rashes- No Rashes.  HEENT Head- Normal. Ear Auditory Canal - Left- Normal. Right - Normal.Tympanic Membrane- Left- Normal. Right- Normal. Eye Sclera/Conjunctiva- Left- Normal. Right- Normal. Nose & Sinuses Nasal Mucosa- Left-  Boggy and Congested. Right-  Boggy and  Congested.Bilateral maxillary and frontal sinus pressure. Mouth & Throat Lips: Upper Lip- Normal: no dryness, cracking, pallor, cyanosis, or vesicular eruption. Lower Lip-Normal: no dryness, cracking, pallor, cyanosis or vesicular eruption. Buccal Mucosa- Bilateral- No Aphthous ulcers. Oropharynx- No Discharge or Erythema. Tonsils: Characteristics- Bilateral- No Erythema or Congestion. Size/Enlargement- Bilateral- No enlargement. Discharge- bilateral-None.  Neck Neck- Supple. No Masses.   Chest and Lung Exam Auscultation: Breath Sounds:-Clear even and unlabored.  Cardiovascular Auscultation:Rythm- Regular, rate and rhythm. Murmurs & Other Heart Sounds:Ausculatation of the heart reveal- No Murmurs.  Lymphatic Head & Neck General Head & Neck Lymphatics: Bilateral: Description- No Localized lymphadenopathy.      Assessment & Plan:  Flu syndrome/suspiciouss for flu despite the rapid flu test negative. Start tamiflu today.  Concern for secondary sinus infection and bronchitis. . Rx doxycycline antibiotic.(rx advisement explained).  For nasal congestion rx flonase. For cough rx benzonatate.  Rest hydrate and tylenol for fever  If symptoms worsen then chest xray on Friday/before the weekend  Mackie Pai, [place]

## 2017-08-11 NOTE — Patient Instructions (Addendum)
Flu syndrome/suspious for flu despite the  rapid flu test  negative. Start tamiflu today. Concern for secondary sinus infection and bronchitis.  Rx doxycycline antibiotic.(rx advisement explained).  For nasal congestion rx flonase. For cough rx benzonatate.  Rest  hydrate and tylenol for fever  If symptoms worsen then chest xray on Friday/before the weekend.

## 2017-08-19 DIAGNOSIS — M47816 Spondylosis without myelopathy or radiculopathy, lumbar region: Secondary | ICD-10-CM | POA: Diagnosis not present

## 2017-08-19 DIAGNOSIS — M4802 Spinal stenosis, cervical region: Secondary | ICD-10-CM | POA: Diagnosis not present

## 2017-08-19 DIAGNOSIS — M48061 Spinal stenosis, lumbar region without neurogenic claudication: Secondary | ICD-10-CM | POA: Diagnosis not present

## 2017-08-20 NOTE — Progress Notes (Signed)
Subjective:   William Hendrix is a 76 y.o. male who presents for an Initial Medicare Annual Wellness Visit. Pt here with his wife.  Review of Systems  No ROS.  Medicare Wellness Visit. Additional risk factors are reflected in the social history.  Cardiac Risk Factors include: advanced age (>54men, >11 women);diabetes mellitus;male gender;hypertension;sedentary lifestyle;obesity (BMI >30kg/m2);dyslipidemia Sleep patterns: Sleeps 6 hrs straight per pt. Naps daily.  Male:   CCS-  Last 11/11/07   PSA- No results found for: PSA     Objective:    Today's Vitals   08/26/17 1028  BP: (!) 144/69  Pulse: 61  Temp: 98.2 F (36.8 C)  SpO2: 97%  Weight: 261 lb 6.4 oz (118.6 kg)  Height: 5\' 10"  (1.778 m)  PainSc: 7    Body mass index is 37.51 kg/m.  Current Medications (verified) Outpatient Encounter Medications as of 08/26/2017  Medication Sig  . allopurinol (ZYLOPRIM) 300 MG tablet TAKE 1 TABLET EVERY DAY  . aspirin EC 81 MG tablet Take 81 mg by mouth daily.  Marland Kitchen atorvastatin (LIPITOR) 40 MG tablet Take 1 tablet (40 mg total) by mouth daily.  . B Complex Vitamins (B COMPLEX-B12 PO) Take 1 tablet by mouth at bedtime.  . BD INSULIN SYRINGE ULTRAFINE 31G X 5/16" 1 ML MISC USE  TO INJECT  INSULIN  FIVE  TIMES  DAILY AS INSTRUCTED  . Cholecalciferol (VITAMIN D) 1000 UNITS capsule Take 1,000 Units by mouth at bedtime.   . clopidogrel (PLAVIX) 75 MG tablet Take 1 tablet (75 mg total) by mouth daily.  . fenofibrate 160 MG tablet Take 160 mg by mouth daily.  . fluticasone (FLONASE) 50 MCG/ACT nasal spray Place 2 sprays into both nostrils daily.  . furosemide (LASIX) 40 MG tablet TAKE 1 AND 1/2 TABLETS EVERY DAY  . insulin NPH Human (NOVOLIN N) 100 UNIT/ML injection Inject 45 units in am and 55 in the evening  . insulin regular (NOVOLIN R RELION) 100 units/mL injection Inject 0.25-0.3 mLs (25-30 Units total) into the skin 3 (three) times daily before meals. (Patient taking differently:  Inject 35 Units into the skin 3 (three) times daily before meals. )  . isosorbide mononitrate (IMDUR) 30 MG 24 hr tablet Take 1 tablet (30 mg total) by mouth daily.  . metoprolol tartrate (LOPRESSOR) 50 MG tablet Take 1 tablet (50 mg total) by mouth 2 (two) times daily.  . nitroGLYCERIN (NITROSTAT) 0.4 MG SL tablet Place 1 tablet (0.4 mg total) under the tongue every 5 (five) minutes as needed for chest pain.  . pantoprazole (PROTONIX) 20 MG tablet Take 1 tablet (20 mg total) by mouth daily.  . tamsulosin (FLOMAX) 0.4 MG CAPS capsule Take 1 capsule (0.4 mg total) by mouth daily.  Marland Kitchen tiZANidine (ZANAFLEX) 4 MG capsule Take 1 capsule (4 mg total) by mouth 4 (four) times daily as needed for muscle spasms. Take 1 tablet every 6 hours as needed; not to exceed 3 doses in 24 hours.  . TRUE METRIX BLOOD GLUCOSE TEST test strip USE TO TEST BLOOD SUGAR THREE TIMES DAILY AS DIRECTED  . acetaminophen (TYLENOL) 500 MG tablet Take 1,000 mg by mouth 2 (two) times daily.  Marland Kitchen albuterol (PROVENTIL HFA;VENTOLIN HFA) 108 (90 Base) MCG/ACT inhaler Inhale 2 puffs into the lungs every 6 (six) hours as needed for wheezing or shortness of breath. (Patient not taking: Reported on 08/26/2017)  . benzonatate (TESSALON) 100 MG capsule Take 1 capsule (100 mg total) by mouth 3 (three) times daily  as needed for cough. (Patient not taking: Reported on 08/26/2017)  . fluocinonide cream (LIDEX) 9.93 % Apply 1 application topically daily as needed. FOR RASH  . [DISCONTINUED] doxycycline (VIBRA-TABS) 100 MG tablet Take 1 tablet (100 mg total) by mouth 2 (two) times daily. Can give caps or generic  . [DISCONTINUED] oseltamivir (TAMIFLU) 75 MG capsule Take 1 capsule (75 mg total) by mouth 2 (two) times daily.   No facility-administered encounter medications on file as of 08/26/2017.     Allergies (verified) Levemir [insulin detemir]; Codeine; Lipitor [atorvastatin]; Novolog mix [insulin aspart prot & aspart]; and Other   History: Past  Medical History:  Diagnosis Date  . AAA (abdominal aortic aneurysm) ([place]) 12/15/2014   a. Mild aneurysmal dilatation of the infrarenal abdominal aorta 3.2 cm - f/u due by 2019.  . Arthritis    "all over" (07/30/2016)  . CAD (coronary artery disease)    a. stent to LAD 2000. b. possible spasm by cath 2001. c. IVUS/PTCA/DES to mLAD 05/2013. d. PTCA/DES of dRCA into ostial rPDA 07/2013. e. PTCA of OM2, DES to Cx, DES to [facility] 07/2016.  Marland Kitchen Chronic bronchitis ([place])   . Chronic diastolic CHF (congestive heart failure) ([place])   . Chronic lower back pain   . Chronic neck pain   . CKD (chronic kidney disease), stage III ([place])   . Diabetic peripheral neuropathy ([place])   . Ejection fraction    55%, 07/2010, mild inferior hypo  . GERD (gastroesophageal reflux disease)   . Gout   . H/O diverticulitis of colon 01/31/2015  . H/O hiatal hernia   . History of blood transfusion ~ 10/1940   "had pneumonia"  . HTN (hypertension)   . Hyperlipidemia   . Melanoma of forearm, left ([place]) 02/2011   with wide excision   . Nephrolithiasis   . Obesity   . OSA on CPAP     [name] since 2000  . Positive D-dimer    a. significant elevation ,hospital 07/2010, etiology unclear. b. D Dimer chronically > 20.  . Skin cancer   . Spinal stenosis    a. s/p surgical repair 2013  . Type II diabetes mellitus ([place])   . Ventral hernia    Past Surgical History:  Procedure Laterality Date  . BACK SURGERY    . CARDIAC CATHETERIZATION N/A 07/30/2016   Procedure: Left Heart Cath and Coronary Angiography;  Surgeon: Nelva Bush, MD;  Location: [place] CV LAB;  Service: Cardiovascular;  Laterality: N/A;  . CARDIAC CATHETERIZATION N/A 07/30/2016   Procedure: Coronary Stent Intervention;  Surgeon: Nelva Bush, MD;  Location: [place] CV LAB;  Service: Cardiovascular;  Laterality: N/A;  Mid CFX and MID RCA  . CARDIAC CATHETERIZATION N/A 07/30/2016   Procedure: Coronary Balloon Angioplasty;  Surgeon: Nelva Bush,  MD;  Location: [place] CV LAB;  Service: Cardiovascular;  Laterality: N/A;  OM 1  . CARPAL TUNNEL RELEASE Left   . CATARACT EXTRACTION W/ INTRAOCULAR LENS  IMPLANT, BILATERAL Bilateral ~ 2014  . CERVICAL DISC SURGERY  1990s  . CORONARY ANGIOPLASTY WITH STENT PLACEMENT  05/2013  . CORONARY ANGIOPLASTY WITH STENT PLACEMENT  07/26/2013   DES to RCA extending to PDA    . CORONARY ANGIOPLASTY WITH STENT PLACEMENT  07/30/2016  . CORONARY ANGIOPLASTY WITH STENT PLACEMENT  2000   CAD  . DENTAL SURGERY  04/2016   "got infected; had to dig it out"  . KNEE ARTHROSCOPY Right 1990's   rt  . LEFT AND RIGHT  HEART CATHETERIZATION WITH CORONARY ANGIOGRAM N/A 07/26/2013   Procedure: LEFT AND RIGHT HEART CATHETERIZATION WITH CORONARY ANGIOGRAM;  Surgeon: [name], MD;  Location: [place] CATH LAB;  Service: Cardiovascular;  Laterality: N/A;  . LEFT HEART CATH AND CORONARY ANGIOGRAPHY N/A 02/26/2017   Procedure: Left Heart Cath and Coronary Angiography;  Surgeon: Sherren Mocha, MD;  Location: [place] CV LAB;  Service: Cardiovascular;  Laterality: N/A;  . LEFT HEART CATHETERIZATION WITH CORONARY ANGIOGRAM N/A 05/19/2013   Procedure: LEFT HEART CATHETERIZATION WITH CORONARY ANGIOGRAM;  Surgeon: [name], MD;  Location: [facility] CATH LAB;  Service: Cardiovascular;  Laterality: N/A;  . LUMBAR LAMINECTOMY/DECOMPRESSION MICRODISCECTOMY  08/30/2012   Procedure: LUMBAR LAMINECTOMY/DECOMPRESSION MICRODISCECTOMY 2 LEVELS;  Surgeon: [name], MD;  Location: [place] NEURO ORS;  Service: Neurosurgery;  Laterality: Bilateral;  Bilateral Lumbar three-four Lumbar four-five Laminotomies  . LUMBAR SPINE SURGERY  04/07/2016   Dr. Ellene Route; "?ruptured disc"  . MELANOMA EXCISION Left 02/2011   forearm  . NASAL SINUS SURGERY  1970s   "cut windows in sinus pockets"  . PERCUTANEOUS CORONARY STENT INTERVENTION (PCI-S)  05/19/2013   Procedure: PERCUTANEOUS CORONARY STENT INTERVENTION (PCI-S);  Surgeon: [name], MD;   Location: [facility] CATH LAB;  Service: Cardiovascular;;  . ULNAR TUNNEL RELEASE Left 04/07/2016   Dr. Ellene Route   Family History  Problem Relation Age of Onset  . Cancer Mother        intestinal   . Heart disease Mother   . Cancer Father        prostate  . Migraines Daughter   . Leukemia Sister   . Heart disease Sister   . Prostate cancer Unknown   . Kidney cancer Unknown   . Cancer Unknown        Bladder cancer  . Coronary artery disease Unknown   . Hypertension Sister   . Hypertension Brother   . Heart attack Neg Hx   . Stroke Neg Hx    Social History   Occupational History  . Occupation: Painter  Tobacco Use  . Smoking status: Former Smoker    Packs/day: 3.00    Years: 30.00    Pack years: 90.00    Types: Cigarettes    Last attempt to quit: 09/17/1996    Years since quitting: 20.9  . Smokeless tobacco: Never Used  Substance and Sexual Activity  . Alcohol use: No  . Drug use: No  . Sexual activity: Not Currently    Birth control/protection: None   Tobacco Counseling Counseling given: Not Answered   Activities of Daily Living In your present state of health, do you have any difficulty performing the following activities: 08/26/2017 02/26/2017  Hearing? Y N  Comment declines audiololgy -  Vision? N N  Comment wears reading glasses. Dr.Shapiro for diabetic eye exam. -  Difficulty concentrating or making decisions? N N  Walking or climbing stairs? N N  Dressing or bathing? N N  Doing errands, shopping? N -  Preparing Food and eating ? N -  Using the Toilet? N -  In the past six months, have you accidently leaked urine? N -  Do you have problems with loss of bowel control? N -  Managing your Medications? N -  Managing your Finances? N -  Housekeeping or managing your Housekeeping? N -  Some recent data might be hidden    Immunizations and Health Maintenance Immunization History  Administered Date(s) Administered  . Influenza Split 08/08/2012  . Influenza Whole  08/08/2009, 07/12/2010  .  Influenza, High Dose Seasonal PF 08/02/2014, 07/11/2015, 08/26/2017  . Influenza,inj,Quad PF,6+ Mos 07/27/2013, 08/04/2016  . Pneumococcal Conjugate-13 08/02/2014  . Pneumococcal Polysaccharide-23 07/28/2005   Health Maintenance Due  Topic Date Due  . TETANUS/TDAP  03[birth year]13/1960  . URINE MICROALBUMIN  08/03/2015  . FOOT EXAM  05/21/2016  . OPHTHALMOLOGY EXAM  09/29/2016    Patient Care Team: [name], MD as PCP - General (Family Medicine) [name], MD (Neurosurgery) Lelon Perla, MD as Consulting Physician (Cardiology) Rutherford Guys, MD as Consulting Physician (Ophthalmology) [name], MD as Consulting Physician (Internal Medicine)  Indicate any recent Medical Services you may have received from other than [facility] providers in the past year (date may be approximate).    Assessment:   This is a routine wellness examination for Khalen. Physical assessment deferred to PCP.   Hearing/Vision screen See screening tab  Dietary issues and exercise activities discussed: Current Exercise Habits: The patient does not participate in regular exercise at present, Exercise limited by: orthopedic condition(s)(chronic back pain) Diet (meal preparation, eat out, water intake, caffeinated beverages, dairy products, fruits and vegetables): in general, a "healthy" diet    Goals    . Weight (lb) < 240 lb (108.9 kg)      Depression Screen PHQ 2/9 Scores 08/26/2017 03/05/2016 11/05/2014  PHQ - 2 Score 0 0 0    Fall Risk Fall Risk  08/26/2017 03/05/2016 11/05/2014  Falls in the past year? No No No    Cognitive Function: MMSE - Mini Mental State Exam 08/26/2017  Orientation to time 5  Orientation to Place 5  Registration 3  Attention/ Calculation 4  Recall 2  Language- name 2 objects 2  Language- repeat 1  Language- follow 3 step command 3  Language- read & follow direction 1  Write a sentence 1  Copy design 1  Total score 28         Screening Tests Health Maintenance  Topic Date Due  . TETANUS/TDAP  [birth year]13/1960  . URINE MICROALBUMIN  08/03/2015  . FOOT EXAM  05/21/2016  . OPHTHALMOLOGY EXAM  09/29/2016  . HEMOGLOBIN A1C  12/09/2017  . INFLUENZA VACCINE  Completed  . PNA vac Low Risk Adult  Completed        Plan:   Follow up with PCP as directed  Continue to eat heart healthy diet (full of fruits, vegetables, whole grains, lean protein, water--limit salt, fat, and sugar intake) and increase physical activity as tolerated.  Continue doing brain stimulating activities (puzzles, reading, adult coloring books, staying active) to keep memory sharp.   Bring a copy of your living will and/or healthcare power of attorney to your next office visit.     I have personally reviewed and noted the following in the patient's chart:   . Medical and social history . Use of alcohol, tobacco or illicit drugs  . Current medications and supplements . Functional ability and status . Nutritional status . Physical activity . Advanced directives . List of other physicians . Hospitalizations, surgeries, and ER visits in previous 12 months . Vitals . Screenings to include cognitive, depression, and falls . Referrals and appointments  In addition, I have reviewed and discussed with patient certain preventive protocols, quality metrics, and best practice recommendations. A written personalized care plan for preventive services as well as general preventive health recommendations were provided to patient.     Shela Nevin, [place]   08/26/2017

## 2017-08-26 ENCOUNTER — Encounter: Payer: Self-pay | Admitting: Family Medicine

## 2017-08-26 ENCOUNTER — Ambulatory Visit (INDEPENDENT_AMBULATORY_CARE_PROVIDER_SITE_OTHER): Payer: Medicare HMO | Admitting: Family Medicine

## 2017-08-26 ENCOUNTER — Ambulatory Visit: Payer: Medicare HMO | Admitting: Family Medicine

## 2017-08-26 VITALS — BP 144/69 | HR 61 | Temp 98.2°F | Ht 70.0 in | Wt 261.4 lb

## 2017-08-26 DIAGNOSIS — E1165 Type 2 diabetes mellitus with hyperglycemia: Secondary | ICD-10-CM

## 2017-08-26 DIAGNOSIS — Z794 Long term (current) use of insulin: Secondary | ICD-10-CM

## 2017-08-26 DIAGNOSIS — N183 Chronic kidney disease, stage 3 unspecified: Secondary | ICD-10-CM

## 2017-08-26 DIAGNOSIS — E785 Hyperlipidemia, unspecified: Secondary | ICD-10-CM

## 2017-08-26 DIAGNOSIS — E669 Obesity, unspecified: Secondary | ICD-10-CM

## 2017-08-26 DIAGNOSIS — Z23 Encounter for immunization: Secondary | ICD-10-CM | POA: Diagnosis not present

## 2017-08-26 DIAGNOSIS — E781 Pure hyperglyceridemia: Secondary | ICD-10-CM

## 2017-08-26 DIAGNOSIS — M549 Dorsalgia, unspecified: Secondary | ICD-10-CM

## 2017-08-26 DIAGNOSIS — I10 Essential (primary) hypertension: Secondary | ICD-10-CM

## 2017-08-26 DIAGNOSIS — N289 Disorder of kidney and ureter, unspecified: Secondary | ICD-10-CM | POA: Diagnosis not present

## 2017-08-26 DIAGNOSIS — E1159 Type 2 diabetes mellitus with other circulatory complications: Secondary | ICD-10-CM | POA: Diagnosis not present

## 2017-08-26 DIAGNOSIS — Z0000 Encounter for general adult medical examination without abnormal findings: Secondary | ICD-10-CM

## 2017-08-26 DIAGNOSIS — IMO0002 Reserved for concepts with insufficient information to code with codable children: Secondary | ICD-10-CM

## 2017-08-26 LAB — CBC
HEMATOCRIT: 40 % (ref 39.0–52.0)
Hemoglobin: 13.5 g/dL (ref 13.0–17.0)
MCHC: 33.8 g/dL (ref 30.0–36.0)
MCV: 94.7 fl (ref 78.0–100.0)
Platelets: 199 10*3/uL (ref 150.0–400.0)
RBC: 4.22 Mil/uL (ref 4.22–5.81)
RDW: 14.1 % (ref 11.5–15.5)
WBC: 7.1 10*3/uL (ref 4.0–10.5)

## 2017-08-26 LAB — COMPREHENSIVE METABOLIC PANEL
ALBUMIN: 4 g/dL (ref 3.5–5.2)
ALT: 21 U/L (ref 0–53)
AST: 23 U/L (ref 0–37)
Alkaline Phosphatase: 42 U/L (ref 39–117)
BUN: 21 mg/dL (ref 6–23)
CHLORIDE: 105 meq/L (ref 96–112)
CO2: 31 meq/L (ref 19–32)
CREATININE: 1.27 mg/dL (ref 0.40–1.50)
Calcium: 10.1 mg/dL (ref 8.4–10.5)
GFR: 58.38 mL/min — ABNORMAL LOW (ref >60.00)
Glucose, Bld: 154 mg/dL — ABNORMAL HIGH (ref 70–99)
POTASSIUM: 4.2 meq/L (ref 3.5–5.1)
SODIUM: 141 meq/L (ref 135–145)
Total Bilirubin: 0.5 mg/dL (ref 0.2–1.2)
Total Protein: 6.8 g/dL (ref 6.0–8.3)

## 2017-08-26 LAB — LIPID PANEL
CHOL/HDL RATIO: 5
CHOLESTEROL: 114 mg/dL (ref 0–200)
HDL: 24.6 mg/dL — ABNORMAL LOW (ref >39.00)
NONHDL: 89.32
Triglycerides: 206 mg/dL — ABNORMAL HIGH (ref 0.0–149.0)
VLDL: 41.2 mg/dL — AB (ref 0.0–40.0)

## 2017-08-26 LAB — LDL CHOLESTEROL, DIRECT: LDL DIRECT: 63 mg/dL

## 2017-08-26 NOTE — Assessment & Plan Note (Signed)
Tolerating statin, encouraged heart healthy diet, avoid trans fats, minimize simple carbs and saturated fats. Increase exercise as tolerated 

## 2017-08-26 NOTE — Assessment & Plan Note (Signed)
Encouraged DASH diet, decrease po intake and increase exercise as tolerated. Needs 7-8 hours of sleep nightly. Avoid trans fats, eat small, frequent meals every 4-5 hours with lean proteins, complex carbs and healthy fats. Minimize simple carbs, consider bariatric referral 

## 2017-08-26 NOTE — Patient Instructions (Addendum)
William Hendrix , Thank you for taking time to come for your Medicare Wellness Visit. I appreciate your ongoing commitment to your health goals. Please review the following plan we discussed and let me know if I can assist you in the future.   These are the goals we discussed: Goals    . Weight (lb) < 240 lb (108.9 kg)       This is a list of the screening recommended for you and due dates:  Health Maintenance  Topic Date Due  . Tetanus Vaccine  03/13/1960  . Urine Protein Check  08/03/2015  . Complete foot exam   05/21/2016  . Eye exam for diabetics  09/29/2016  . Hemoglobin A1C  12/09/2017  . Flu Shot  Completed  . Pneumonia vaccines  Completed   Continue to eat heart healthy diet (full of fruits, vegetables, whole grains, lean protein, water--limit salt, fat, and sugar intake) and increase physical activity as tolerated.  Continue doing brain stimulating activities (puzzles, reading, adult coloring books, staying active) to keep memory sharp.   Bring a copy of your living will and/or healthcare power of attorney to your next office visit. Health Maintenance, Male A healthy lifestyle and preventive care is important for your health and wellness. Ask your health care provider about what schedule of regular examinations is right for you. What should I know about weight and diet? Eat a Healthy Diet  Eat plenty of vegetables, fruits, whole grains, low-fat dairy products, and lean protein.  Do not eat a lot of foods high in solid fats, added sugars, or salt.  Maintain a Healthy Weight Regular exercise can help you achieve or maintain a healthy weight. You should:  Do at least 150 minutes of exercise each week. The exercise should increase your heart rate and make you sweat (moderate-intensity exercise).  Do strength-training exercises at least twice a week.  Watch Your Levels of Cholesterol and Blood Lipids  Have your blood tested for lipids and cholesterol every 5 years  starting at 77 years of age. If you are at high risk for heart disease, you should start having your blood tested when you are 77 years old. You may need to have your cholesterol levels checked more often if: ? Your lipid or cholesterol levels are high. ? You are older than 77 years of age. ? You are at high risk for heart disease.  What should I know about cancer screening? Many types of cancers can be detected early and may often be prevented. Lung Cancer  You should be screened every year for lung cancer if: ? You are a current smoker who has smoked for at least 30 years. ? You are a former smoker who has quit within the past 15 years.  Talk to your health care provider about your screening options, when you should start screening, and how often you should be screened.  Colorectal Cancer  Routine colorectal cancer screening usually begins at 77 years of age and should be repeated every 5-10 years until you are 77 years old. You may need to be screened more often if early forms of precancerous polyps or small growths are found. Your health care provider may recommend screening at an earlier age if you have risk factors for colon cancer.  Your health care provider may recommend using home test kits to check for hidden blood in the stool.  A small camera at the end of a tube can be used to examine your colon (sigmoidoscopy or  colonoscopy). This checks for the earliest forms of colorectal cancer.  Prostate and Testicular Cancer  Depending on your age and overall health, your health care provider may do certain tests to screen for prostate and testicular cancer.  Talk to your health care provider about any symptoms or concerns you have about testicular or prostate cancer.  Skin Cancer  Check your skin from head to toe regularly.  Tell your health care provider about any new moles or changes in moles, especially if: ? There is a change in a mole's size, shape, or color. ? You have a  mole that is larger than a pencil eraser.  Always use sunscreen. Apply sunscreen liberally and repeat throughout the day.  Protect yourself by wearing long sleeves, pants, a wide-brimmed hat, and sunglasses when outside.  What should I know about heart disease, diabetes, and high blood pressure?  If you are 26-17 years of age, have your blood pressure checked every 3-5 years. If you are 30 years of age or older, have your blood pressure checked every year. You should have your blood pressure measured twice-once when you are at a hospital or clinic, and once when you are not at a hospital or clinic. Record the average of the two measurements. To check your blood pressure when you are not at a hospital or clinic, you can use: ? An automated blood pressure machine at a pharmacy. ? A home blood pressure monitor.  Talk to your health care provider about your target blood pressure.  If you are between 82-11 years old, ask your health care provider if you should take aspirin to prevent heart disease.  Have regular diabetes screenings by checking your fasting blood sugar level. ? If you are at a normal weight and have a low risk for diabetes, have this test once every three years after the age of 37. ? If you are overweight and have a high risk for diabetes, consider being tested at a younger age or more often.  A one-time screening for abdominal aortic aneurysm (AAA) by ultrasound is recommended for men aged 57-75 years who are current or former smokers. What should I know about preventing infection? Hepatitis B If you have a higher risk for hepatitis B, you should be screened for this virus. Talk with your health care provider to find out if you are at risk for hepatitis B infection. Hepatitis C Blood testing is recommended for:  Everyone born from [birth year] through 1966.  Anyone with known risk factors for hepatitis C.  Sexually Transmitted Diseases (STDs)  You should be screened each year for  STDs including gonorrhea and chlamydia if: ? You are sexually active and are younger than 77 years of age. ? You are older than 77 years of age and your health care provider tells you that you are at risk for this type of infection. ? Your sexual activity has changed since you were last screened and you are at an increased risk for chlamydia or gonorrhea. Ask your health care provider if you are at risk.  Talk with your health care provider about whether you are at high risk of being infected with HIV. Your health care provider may recommend a prescription medicine to help prevent HIV infection.  What else can I do?  Schedule regular health, dental, and eye exams.  Stay current with your vaccines (immunizations).  Do not use any tobacco products, such as cigarettes, chewing tobacco, and e-cigarettes. If you need help quitting, ask your health  care provider.  Limit alcohol intake to no more than 2 drinks per day. One drink equals 12 ounces of beer, 5 ounces of wine, or 1 ounces of hard liquor.  Do not use street drugs.  Do not share needles.  Ask your health care provider for help if you need support or information about quitting drugs.  Tell your health care provider if you often feel depressed.  Tell your health care provider if you have ever been abused or do not feel safe at home. This information is not intended to replace advice given to you by your health care provider. Make sure you discuss any questions you have with your health care provider. Document Released: 03/26/2008 Document Revised: 05/27/2016 Document Reviewed: 07/02/2015 Elsevier Interactive Patient Education  [employer].

## 2017-08-26 NOTE — Assessment & Plan Note (Signed)
Well controlled, no changes to meds. Encouraged heart healthy diet such as the DASH diet and exercise as tolerated.  °

## 2017-08-26 NOTE — Assessment & Plan Note (Signed)
Check cmp hydrate

## 2017-08-27 ENCOUNTER — Other Ambulatory Visit: Payer: Self-pay | Admitting: Neurological Surgery

## 2017-08-27 LAB — TSH: TSH: 2.15 u[IU]/mL (ref 0.35–4.50)

## 2017-08-29 DIAGNOSIS — M549 Dorsalgia, unspecified: Secondary | ICD-10-CM | POA: Insufficient documentation

## 2017-08-29 NOTE — Progress Notes (Signed)
Patient ID: William Hendrix, male   DOB: [birth year], 76 y.o.   MRN: [id number]   Subjective:    Patient ID: William Hendrix, male    DOB: [birth year], 76 y.o.   MRN: [id number]  Chief Complaint  Patient presents with  . Medicare Wellness    with RN    HPI Patient is in today for follow up accompanied by his wife. He continues to struggle with low back pain with right sided radicular symptoms. No recent trauma but his pain is severe enough he is following closely with Dr Ellene Route of neurosurgery. They plan to proceed to surgery but not til the first of the year. No recent febrile illness or hospitalizations. No polyuria or polydipsia. Denies CP/palp/SOB/HA/congestion/fevers/GI or GU c/o. Taking meds as prescribed  Past Medical History:  Diagnosis Date  . AAA (abdominal aortic aneurysm) ([place]) 12/15/2014   a. Mild aneurysmal dilatation of the infrarenal abdominal aorta 3.2 cm - f/u due by 2019.  . Arthritis    "all over" (07/30/2016)  . CAD (coronary artery disease)    a. stent to LAD 2000. b. possible spasm by cath 2001. c. IVUS/PTCA/DES to mLAD 05/2013. d. PTCA/DES of dRCA into ostial rPDA 07/2013. e. PTCA of OM2, DES to Cx, DES to [facility] 07/2016.  Marland Kitchen Chronic bronchitis ([place])   . Chronic diastolic CHF (congestive heart failure) ([place])   . Chronic lower back pain   . Chronic neck pain   . CKD (chronic kidney disease), stage III ([place])   . Diabetic peripheral neuropathy ([place])   . Ejection fraction    55%, 07/2010, mild inferior hypo  . GERD (gastroesophageal reflux disease)   . Gout   . H/O diverticulitis of colon 01/31/2015  . H/O hiatal hernia   . History of blood transfusion ~ 10/1940   "had pneumonia"  . HTN (hypertension)   . Hyperlipidemia   . Melanoma of forearm, left ([place]) 02/2011   with wide excision   . Nephrolithiasis   . Obesity   . OSA on CPAP     [name] since 2000  . Positive D-dimer    a. significant elevation ,hospital 07/2010, etiology unclear. b. D Dimer chronically >  20.  . Skin cancer   . Spinal stenosis    a. s/p surgical repair 2013  . Type II diabetes mellitus ([place])   . Ventral hernia     Past Surgical History:  Procedure Laterality Date  . BACK SURGERY    . CARPAL TUNNEL RELEASE Left   . CATARACT EXTRACTION W/ INTRAOCULAR LENS  IMPLANT, BILATERAL Bilateral ~ 2014  . CERVICAL DISC SURGERY  1990s  . CORONARY ANGIOPLASTY WITH STENT PLACEMENT  05/2013  . CORONARY ANGIOPLASTY WITH STENT PLACEMENT  07/26/2013   DES to RCA extending to PDA    . CORONARY ANGIOPLASTY WITH STENT PLACEMENT  07/30/2016  . CORONARY ANGIOPLASTY WITH STENT PLACEMENT  2000   CAD  . Coronary Balloon Angioplasty N/A 07/30/2016   Performed by Nelva Bush, MD at [facility]  . Coronary Stent Intervention N/A 07/30/2016   Performed by Nelva Bush, MD at [place] [facility]  . DENTAL SURGERY  04/2016   "got infected; had to dig it out"  . KNEE ARTHROSCOPY Right 1990's   rt  . LEFT AND RIGHT HEART CATHETERIZATION WITH CORONARY ANGIOGRAM N/A 07/26/2013   Performed by [name], MD at [facility] CATH LAB  . Left Heart Cath and Coronary Angiography N/A 02/26/2017   Performed  by Sherren Mocha, MD at [place] [facility]  . Left Heart Cath and Coronary Angiography N/A 07/30/2016   Performed by Nelva Bush, MD at [place] [facility]  . LEFT HEART CATHETERIZATION WITH CORONARY ANGIOGRAM N/A 05/19/2013   Performed by [name], MD at [facility] CATH LAB  . LUMBAR LAMINECTOMY/DECOMPRESSION MICRODISCECTOMY 2 LEVELS Bilateral 08/30/2012   Performed by [name], MD at [facility] NEURO ORS  . LUMBAR SPINE SURGERY  04/07/2016   Dr. Ellene Route; "?ruptured disc"  . MELANOMA EXCISION Left 02/2011   forearm  . NASAL SINUS SURGERY  1970s   "cut windows in sinus pockets"  . PERCUTANEOUS CORONARY STENT INTERVENTION (PCI-S)  05/19/2013   Performed by [name], MD at [facility] CATH LAB  . ULNAR TUNNEL RELEASE Left 04/07/2016   Dr. Ellene Route    Family History  Problem Relation Age  of Onset  . Cancer Mother        intestinal   . Heart disease Mother   . Cancer Father        prostate  . Migraines Daughter   . Leukemia Sister   . Heart disease Sister   . Prostate cancer Unknown   . Kidney cancer Unknown   . Cancer Unknown        Bladder cancer  . Coronary artery disease Unknown   . Hypertension Sister   . Hypertension Brother   . Heart attack Neg Hx   . Stroke Neg Hx     Social History   Socioeconomic History  . Marital status: Married    Spouse name: Not on file  . Number of children: Not on file  . Years of education: Not on file  . Highest education level: Not on file  Social Needs  . Financial resource strain: Not on file  . Food insecurity - worry: Not on file  . Food insecurity - inability: Not on file  . Transportation needs - medical: Not on file  . Transportation needs - non-medical: Not on file  Occupational History  . Occupation: Painter  Tobacco Use  . Smoking status: Former Smoker    Packs/day: 3.00    Years: 30.00    Pack years: 90.00    Types: Cigarettes    Last attempt to quit: 09/17/1996    Years since quitting: 20.9  . Smokeless tobacco: Never Used  Substance and Sexual Activity  . Alcohol use: No  . Drug use: No  . Sexual activity: Not Currently    Birth control/protection: None  Other Topics Concern  . Not on file  Social History Narrative   Married and lives locally with his wife.  Sharyon Cable.    Outpatient Medications Prior to Visit  Medication Sig Dispense Refill  . allopurinol (ZYLOPRIM) 300 MG tablet TAKE 1 TABLET EVERY DAY 90 tablet 1  . aspirin EC 81 MG tablet Take 81 mg by mouth daily.    Marland Kitchen atorvastatin (LIPITOR) 40 MG tablet Take 1 tablet (40 mg total) by mouth daily. 90 tablet 1  . B Complex Vitamins (B COMPLEX-B12 PO) Take 1 tablet by mouth at bedtime.    . BD INSULIN SYRINGE ULTRAFINE 31G X 5/16" 1 ML MISC USE  TO INJECT  INSULIN  FIVE  TIMES  DAILY AS INSTRUCTED 450 each 3  . Cholecalciferol (VITAMIN D)  1000 UNITS capsule Take 1,000 Units by mouth at bedtime.     . clopidogrel (PLAVIX) 75 MG tablet Take 1 tablet (75 mg total) by mouth daily.  90 tablet 3  . fenofibrate 160 MG tablet Take 160 mg by mouth daily.    . fluticasone (FLONASE) 50 MCG/ACT nasal spray Place 2 sprays into both nostrils daily. 16 g 1  . furosemide (LASIX) 40 MG tablet TAKE 1 AND 1/2 TABLETS EVERY DAY 135 tablet 2  . insulin NPH Human (NOVOLIN N) 100 UNIT/ML injection Inject 45 units in am and 55 in the evening 10 mL   . insulin regular (NOVOLIN R RELION) 100 units/mL injection Inject 0.25-0.3 mLs (25-30 Units total) into the skin 3 (three) times daily before meals. (Patient taking differently: Inject 35 Units into the skin 3 (three) times daily before meals. ) 70 mL 1  . isosorbide mononitrate (IMDUR) 30 MG 24 hr tablet Take 1 tablet (30 mg total) by mouth daily. 90 tablet 3  . metoprolol tartrate (LOPRESSOR) 50 MG tablet Take 1 tablet (50 mg total) by mouth 2 (two) times daily. 180 tablet 1  . nitroGLYCERIN (NITROSTAT) 0.4 MG SL tablet Place 1 tablet (0.4 mg total) under the tongue every 5 (five) minutes as needed for chest pain. 25 tablet 3  . pantoprazole (PROTONIX) 20 MG tablet Take 1 tablet (20 mg total) by mouth daily. 90 tablet 3  . tamsulosin (FLOMAX) 0.4 MG CAPS capsule Take 1 capsule (0.4 mg total) by mouth daily. 90 capsule 1  . tiZANidine (ZANAFLEX) 4 MG capsule Take 1 capsule (4 mg total) by mouth 4 (four) times daily as needed for muscle spasms. Take 1 tablet every 6 hours as needed; not to exceed 3 doses in 24 hours. 40 capsule 2  . TRUE METRIX BLOOD GLUCOSE TEST test strip USE TO TEST BLOOD SUGAR THREE TIMES DAILY AS DIRECTED 300 each 3  . acetaminophen (TYLENOL) 500 MG tablet Take 1,000 mg by mouth 2 (two) times daily.    Marland Kitchen albuterol (PROVENTIL HFA;VENTOLIN HFA) 108 (90 Base) MCG/ACT inhaler Inhale 2 puffs into the lungs every 6 (six) hours as needed for wheezing or shortness of breath. (Patient not taking:  Reported on 08/26/2017) 1 Inhaler 0  . benzonatate (TESSALON) 100 MG capsule Take 1 capsule (100 mg total) by mouth 3 (three) times daily as needed for cough. (Patient not taking: Reported on 08/26/2017) 21 capsule 0  . fluocinonide cream (LIDEX) 4.09 % Apply 1 application topically daily as needed. FOR RASH    . doxycycline (VIBRA-TABS) 100 MG tablet Take 1 tablet (100 mg total) by mouth 2 (two) times daily. Can give caps or generic 20 tablet 0  . oseltamivir (TAMIFLU) 75 MG capsule Take 1 capsule (75 mg total) by mouth 2 (two) times daily. 10 capsule 0   No facility-administered medications prior to visit.     Allergies  Allergen Reactions  . Levemir [Insulin Detemir] Swelling    Patient had redness and swelling and tenderness at injection site.  . Codeine Other (See Comments)    Makes him "shakey", is OK with hydrocodone GI UPSET & TREMORS  . Lipitor [Atorvastatin] Other (See Comments)    Muscle aches; liver functions. Patient is currently taken  . Novolog Mix [Insulin Aspart Prot & Aspart] Other (See Comments)    Causes skin to swell/itch at injection site  . Other Other (See Comments)    Brilinta caused SOB    Review of Systems  Constitutional: Positive for malaise/fatigue. Negative for fever.  HENT: Negative for congestion.   Eyes: Negative for blurred vision.  Respiratory: Negative for shortness of breath.   Cardiovascular: Negative for chest pain,  palpitations and leg swelling.  Gastrointestinal: Negative for abdominal pain, blood in stool and nausea.  Genitourinary: Negative for dysuria and frequency.  Musculoskeletal: Positive for back pain, joint pain and myalgias. Negative for falls.  Skin: Negative for rash.  Neurological: Negative for dizziness, loss of consciousness and headaches.  Endo/Heme/Allergies: Negative for environmental allergies.  Psychiatric/Behavioral: Negative for depression. The patient is not nervous/anxious.        Objective:    Physical Exam    Constitutional: He is oriented to person, place, and time. He appears well-developed and well-nourished. No distress.  HENT:  Head: Normocephalic and atraumatic.  Nose: Nose normal.  Eyes: Right eye exhibits no discharge. Left eye exhibits no discharge.  Neck: Normal range of motion. Neck supple.  Cardiovascular: Normal rate and regular rhythm.  No murmur heard. Pulmonary/Chest: Effort normal and breath sounds normal.  Abdominal: Soft. Bowel sounds are normal. There is no tenderness.  Musculoskeletal: He exhibits no edema.  Neurological: He is alert and oriented to person, place, and time.  Skin: Skin is warm and dry.  Psychiatric: He has a normal mood and affect.  Nursing note and vitals reviewed.   BP (!) 144/69 (BP Location: Left Arm, Patient Position: Sitting, Cuff Size: Normal)   Pulse 61   Temp 98.2 F (36.8 C)   Ht 5\' 10"  (1.778 m)   Wt 261 lb 6.4 oz (118.6 kg)   SpO2 97%   BMI 37.51 kg/m  Wt Readings from Last 3 Encounters:  08/26/17 261 lb 6.4 oz (118.6 kg)  08/11/17 260 lb 12.8 oz (118.3 kg)  07/15/17 263 lb (119.3 kg)     Lab Results  Component Value Date   WBC 7.1 08/26/2017   HGB 13.5 08/26/2017   HCT 40.0 08/26/2017   PLT 199.0 08/26/2017   GLUCOSE 154 (H) 08/26/2017   CHOL 114 08/26/2017   TRIG 206.0 (H) 08/26/2017   HDL 24.60 (L) 08/26/2017   LDLDIRECT 63.0 08/26/2017   LDLCALC 31 04/09/2014   ALT 21 08/26/2017   AST 23 08/26/2017   NA 141 08/26/2017   K 4.2 08/26/2017   CL 105 08/26/2017   CREATININE 1.27 08/26/2017   BUN 21 08/26/2017   CO2 31 08/26/2017   TSH 2.15 08/26/2017   INR 1.1 02/24/2017   HGBA1C 7.3 06/09/2017   MICROALBUR 0.7 08/02/2014    Lab Results  Component Value Date   TSH 2.15 08/26/2017   Lab Results  Component Value Date   WBC 7.1 08/26/2017   HGB 13.5 08/26/2017   HCT 40.0 08/26/2017   MCV 94.7 08/26/2017   PLT 199.0 08/26/2017   Lab Results  Component Value Date   NA 141 08/26/2017   K 4.2 08/26/2017    CO2 31 08/26/2017   GLUCOSE 154 (H) 08/26/2017   BUN 21 08/26/2017   CREATININE 1.27 08/26/2017   BILITOT 0.5 08/26/2017   ALKPHOS 42 08/26/2017   AST 23 08/26/2017   ALT 21 08/26/2017   PROT 6.8 08/26/2017   ALBUMIN 4.0 08/26/2017   CALCIUM 10.1 08/26/2017   ANIONGAP 7 07/15/2017   GFR 58.38 (L) 08/26/2017   Lab Results  Component Value Date   CHOL 114 08/26/2017   Lab Results  Component Value Date   HDL 24.60 (L) 08/26/2017   Lab Results  Component Value Date   LDLCALC 31 04/09/2014   Lab Results  Component Value Date   TRIG 206.0 (H) 08/26/2017   Lab Results  Component Value Date   CHOLHDL 5  08/26/2017   Lab Results  Component Value Date   HGBA1C 7.3 06/09/2017       Assessment & Plan:   Problem List Items Addressed This Visit    Renal insufficiency    Check cmp hydrate      Hyperlipidemia    Encouraged heart healthy diet, increase exercise, avoid trans fats, consider a krill oil cap daily      Essential hypertension    Well controlled, no changes to meds. Encouraged heart healthy diet such as the DASH diet and exercise as tolerated.       Relevant Orders   CBC (Completed)   Comprehensive metabolic panel (Completed)   TSH (Completed)   Hypertriglyceridemia    Tolerating statin, encouraged heart healthy diet, avoid trans fats, minimize simple carbs and saturated fats. Increase exercise as tolerated      Relevant Orders   Lipid panel (Completed)   Uncontrolled type 2 diabetes mellitus with circulatory disorder, with long-term current use of insulin [facility])    Following with endocrinology and is doing well. Has largely maintained his vegetarian diet they recommended and he feels better.      Obesity    Encouraged DASH diet, decrease po intake and increase exercise as tolerated. Needs 7-8 hours of sleep nightly. Avoid trans fats, eat small, frequent meals every 4-5 hours with lean proteins, complex carbs and healthy fats. Minimize simple carbs,  consider bariatric referral      CKD (chronic kidney disease), stage III ([facility])    Stable, continue to monitor manintain adequate hydration      Back pain    Encouraged moist heat and gentle stretching as tolerated. May try NSAIDs and prescription meds as directed and report if symptoms worsen or seek immediate care. Is following with Dr Ellene Route for his low back pain with right sided symptoms and they are planning to do surgery but not sure when.       Other Visit Diagnoses    Encounter for Medicare annual wellness exam    -  Primary   Encounter for immunization       Relevant Orders   Flu vaccine HIGH DOSE PF (Completed)      I have discontinued Elyn Aquas. Greener's oseltamivir and doxycycline. I am also having him maintain his Vitamin D, fluocinonide cream, aspirin EC, acetaminophen, nitroGLYCERIN, insulin regular, B Complex Vitamins (B COMPLEX-B12 PO), albuterol, pantoprazole, tiZANidine, clopidogrel, isosorbide mononitrate, insulin NPH Human, BD INSULIN SYRINGE ULTRAFINE, atorvastatin, metoprolol tartrate, tamsulosin, fenofibrate, TRUE METRIX BLOOD GLUCOSE TEST, allopurinol, furosemide, benzonatate, and fluticasone.  No orders of the defined types were placed in this encounter.    Penni Homans, MD

## 2017-08-29 NOTE — Assessment & Plan Note (Signed)
Following with endocrinology and is doing well. Has largely maintained his vegetarian diet they recommended and he feels better.

## 2017-08-29 NOTE — Assessment & Plan Note (Signed)
Stable, continue to monitor manintain adequate hydration

## 2017-08-29 NOTE — Assessment & Plan Note (Signed)
Encouraged heart healthy diet, increase exercise, avoid trans fats, consider a krill oil cap daily 

## 2017-08-29 NOTE — Assessment & Plan Note (Signed)
Encouraged moist heat and gentle stretching as tolerated. May try NSAIDs and prescription meds as directed and report if symptoms worsen or seek immediate care. Is following with Dr Ellene Route for his low back pain with right sided symptoms and they are planning to do surgery but not sure when.

## 2017-08-30 ENCOUNTER — Telehealth: Payer: Self-pay | Admitting: *Deleted

## 2017-08-30 NOTE — Telephone Encounter (Signed)
    Chart reviewed as part of pre-operative protocol coverage. Patient was contacted 08/30/2017 in reference to pre-operative risk assessment for pending surgery as outlined below.  William Hendrix was last seen on 04/28/17 by [place].  Since that day, William Hendrix has done well. No cardiac issue except back pain limiting his ambulation.   Per Last note "discontinue Plavix at the end of October". He is currently taking ASA and Plavix. I will route to Dr. Stanford Breed to review antiplatelet therapy.   Therefore, based on ACC/AHA guidelines, the patient would be at acceptable risk for the planned procedure without further cardiovascular testing.   [place], [place] 08/30/2017, 4:25 PM

## 2017-08-30 NOTE — Telephone Encounter (Signed)
Ok to DC plavix; ok for surgery Kirk Ruths

## 2017-08-30 NOTE — Telephone Encounter (Signed)
   [facility] Pre-operative Risk Assessment    Request for surgical clearance:  1. What type of surgery is being performed? L2-3, L3-4, L4-5 ANTEROLATERAL LUMBAR INTERBODAY FUSION WITH PERCUTANEOUS SCREW FIXATION AND INFUSE    2. When is this surgery scheduled? 10/22/2017   3. Are there any medications that need to be held prior to surgery and how long?PLAVIX   4. Practice name and name of physician performing surgery? [facility]    5. What is your office phone and fax number? PHONE [phone] FAX [phone]  Anesthesia type (None, local, MAC, general) ? UNKNOWN

## 2017-08-31 NOTE — Telephone Encounter (Signed)
See Dr. Jacalyn Lefevre comments below. [place] for surgery. Ok to hold Plavix.   Will route clearance to fax # listed below.

## 2017-09-06 DIAGNOSIS — Z6835 Body mass index (BMI) 35.0-35.9, adult: Secondary | ICD-10-CM | POA: Diagnosis not present

## 2017-09-06 DIAGNOSIS — I10 Essential (primary) hypertension: Secondary | ICD-10-CM | POA: Diagnosis not present

## 2017-09-06 DIAGNOSIS — E785 Hyperlipidemia, unspecified: Secondary | ICD-10-CM | POA: Diagnosis not present

## 2017-09-06 DIAGNOSIS — N400 Enlarged prostate without lower urinary tract symptoms: Secondary | ICD-10-CM | POA: Diagnosis not present

## 2017-09-09 ENCOUNTER — Ambulatory Visit (INDEPENDENT_AMBULATORY_CARE_PROVIDER_SITE_OTHER): Payer: Medicare HMO | Admitting: Internal Medicine

## 2017-09-09 ENCOUNTER — Encounter: Payer: Self-pay | Admitting: Internal Medicine

## 2017-09-09 VITALS — BP 118/74 | HR 60 | Wt 261.6 lb

## 2017-09-09 DIAGNOSIS — Z6837 Body mass index (BMI) 37.0-37.9, adult: Secondary | ICD-10-CM

## 2017-09-09 DIAGNOSIS — E785 Hyperlipidemia, unspecified: Secondary | ICD-10-CM

## 2017-09-09 DIAGNOSIS — IMO0002 Reserved for concepts with insufficient information to code with codable children: Secondary | ICD-10-CM

## 2017-09-09 DIAGNOSIS — E1159 Type 2 diabetes mellitus with other circulatory complications: Secondary | ICD-10-CM | POA: Diagnosis not present

## 2017-09-09 DIAGNOSIS — Z794 Long term (current) use of insulin: Secondary | ICD-10-CM | POA: Diagnosis not present

## 2017-09-09 DIAGNOSIS — E1165 Type 2 diabetes mellitus with hyperglycemia: Secondary | ICD-10-CM | POA: Diagnosis not present

## 2017-09-09 LAB — POCT GLYCOSYLATED HEMOGLOBIN (HGB A1C): HEMOGLOBIN A1C: 6.3

## 2017-09-09 NOTE — Progress Notes (Signed)
Patient ID: William Hendrix, male   DOB: 19-Feb-1941, 76 y.o.   MRN: [id number]  HPI: William Hendrix is a 76 y.o.-year-old male, returning for f/u for DM2, dx 2009, insulin-dependent, uncontrolled, with complications (CAD s/p AMI 2017, CKD stage 3). Last visit 3 mo ago.  Since last visit, he eliminated meat almost completely >> sugars much better. He also lost weight (by his scale: 15 lbs: 267 >> 251). He still cooks with butter and chicken stock. Had [place] for Thanksgiving.  He will have back surgery in 10/2017.  Last hemoglobin A1c was: Lab Results  Component Value Date   HGBA1C 7.3 06/09/2017   HGBA1C 7.5 03/02/2017   HGBA1C 7.7 11/26/2016   He is now on: ReliOn insulin: Insulin Before breakfast Before lunch Before dinner  Regular 35-40 35-40 35-40  NPH 45  50   We cannot use Metformin 2/2 CKD. We stopped Januvia >> could not afford ($100/3 mo)  We cannot use Levemir >> developed a severe skin reaction We stopped Amaryl 4 mg.  He checks sugars 3x a day >> per log: - am:  158-190 >> 149-226, 269 >> 152-220 >> 80-158, 170 - Before lunch:  141-221, 241 >> 138-201 >> 87-173, 212 - before dinner: 103-201 >> 99, 107, 139-216 >> 66, 72-157, 209 - Before bedtime:  131-276 >> 115-253, 325 >> 89-169 Lowest sugar was 42 x1, 60 >> 51 x1 >> 103 >> 99 >> 66, ? hypoglycemia awareness.  Highest sugar was 325 >> 212.  Meals: - Breakfast: oatmeal + toast + tomatoes - Lunch: beans  - Dinner:home cooked veggies  - Snacks:  + CKD, last BUN/creatinine was:  Lab Results  Component Value Date   BUN 21 08/26/2017   CREATININE 1.27 08/26/2017   Latest ACR:. Lab Results  Component Value Date   MICRALBCREAT 5.1 08/02/2014   MICRALBCREAT 0.7 02/23/2013   Latest GFR: Lab Results  Component Value Date   GFRAA 51 (L) 07/15/2017   GFRAA 51 (L) 02/26/2017   GFRAA 53 (L) 08/05/2016   GFRAA 48 (L) 08/02/2016   GFRAA 41 (L) 08/01/2016   GFRAA 58 (L) 07/31/2016   GFRAA 56 (L) 07/30/2016   GFRAA 57 (L) 12/15/2014   GFRAA 73 (L) 07/27/2013   GFRAA 67 (L) 05/20/2013   + HL: Last lipids - higher TG: Lab Results  Component Value Date   CHOL 114 08/26/2017   HDL 24.60 (L) 08/26/2017   LDLCALC 31 04/09/2014   LDLDIRECT 63.0 08/26/2017   TRIG 206.0 (H) 08/26/2017   CHOLHDL 5 08/26/2017  He is on Lipitor 40;  Fenofibrate 160; flax oil Pt's last eye exam was 09/2015 >> No DR.Had cataract sx in 2011. No numbness and tingling in his legs.  He had back surgery in 03/2016.  He was admitted 2x in 2017 for: CP/SOB >> AMI. He had 2 stents placed. He had a cardiac cath 02/2017 >> No new blockages.   PMH: Pt also also has a history of CAD, hypertension, hyperlipidemia, chronic kidney disease stage III, history of nephrolithiasis, obesity, hiatal hernia, obstructive sleep apnea, hyperglyceridemia, melanoma, spinal stenosis.  ROS: Constitutional: + weight loss, no fatigue, no subjective hyperthermia, no subjective hypothermia Eyes: no blurry vision, no xerophthalmia ENT: no sore throat, no nodules palpated in throat, no dysphagia, no odynophagia, no hoarseness Cardiovascular: no CP/no SOB/no palpitations/no leg swelling Respiratory: no cough/no SOB/no wheezing Gastrointestinal: no N/no V/no D/no C/no acid reflux Musculoskeletal: no muscle aches/no joint aches Skin: no rashes, no hair loss  Neurological: no tremors/no numbness/no tingling/no dizziness  I reviewed pt's medications, allergies, PMH, social hx, family hx, and changes were documented in the history of present illness. Otherwise, unchanged from my initial visit note.  PE: BP 118/74   Pulse 60   Wt 261 lb 9.6 oz (118.7 kg)   SpO2 98%   BMI 37.54 kg/m  Body mass index is 37.54 kg/m. Wt Readings from Last 3 Encounters:  09/09/17 261 lb 9.6 oz (118.7 kg)  08/26/17 261 lb 6.4 oz (118.6 kg)  08/11/17 260 lb 12.8 oz (118.3 kg)   Constitutional: overweight, in NAD Eyes: PERRLA, EOMI, no exophthalmos ENT: moist mucous  membranes, no thyromegaly, no cervical lymphadenopathy Cardiovascular: RRR, No MRG Respiratory: CTA B Gastrointestinal: abdomen soft, NT, ND, BS+ Musculoskeletal: no deformities, strength intact in all 4 Skin: moist, warm, no rashes Neurological: no tremor with outstretched hands, DTR normal in all 4  ASSESSMENT: 1. DM2, insulin-dependent, uncontrolled, with complications - CAD, s/p stents - He had stenting of distal RCA/PDA in 07/2013. Prior stenting of LAD in 04/2013. 2 new stents: PCI to left circumflex and the mid RCA on 07/30/2016. - AAA - CKD stage 3  2. HL  3.  Obesity class II BMI Classification:  < 18.5 underweight   18.5-24.9 normal weight   25.0-29.9 overweight   30.0-34.9 class I obesity   35.0-39.9 class II obesity   ? 40.0 class III obesity   PLAN:  1. Patient with history of uncontrolled diabetes, on basal/bolus insulin regimen,  with significant improvement in his sugars since last visit, after he eliminated meat from his diet almost completely.  He also lost a significant amount of weight.  I congratulated him for this!  We also discussed about ways to improve his diet even more so we can reduce his insulin doses.  As of now, he still has some hyperglycemic spikes, so we cannot change the doses significantly. - He now has occasional very mild lows mainly before dinner so I advised him to take a slightly lower dose of R insulin before lunch - When his sugars are low before dinner, he sometimes skips the entire dose of insulin then, but we discussed about taking a lower dose rather than skipping the insulin completely. - I advised him to: Patient Instructions   Please change:  Insulin Before breakfast Before lunch Before dinner  Regular 35 30-35 35(-40) (if sugars <100, take 15 units)  NPH 45  50   - today, HbA1c is 6.3% (BETTER!) - continue checking sugars at different times of the day - check 3x a day, rotating checks - advised for yearly eye exams >>  he is UTD - Return to clinic in 3 mo with sugar log    2. HL - On previous lipid panel, his triglycerides were high: 516 however, I reviewed his most recent triglyceride level obtained this month and is dropped to 200s.  This is most likely secondary to his dietary changes.  I strongly encouraged him to continue.  We discussed about cooking without butter and switching from chicken stuck to vegetable stock. - He is on Lipitor and fenofibrate. We added flax seed oil at last visit.  No side effects.  3.  Obesity  - per his scale at home, he lost 16 lbs after he changed his diet.  - he is determined to continue with the diet - he will need to lose as much weight as possible for his upcoming back sx  [name], MD PhD  [facility]

## 2017-09-09 NOTE — Addendum Note (Signed)
Addended by: Drucilla Schmidt on: 09/09/2017 11:58 AM   Modules accepted: Orders

## 2017-09-09 NOTE — Patient Instructions (Signed)
Please change:  Insulin Before breakfast Before lunch Before dinner  Regular 35 30-35 35(-40) (if sugars <100, take 15 units)  NPH 45  50

## 2017-09-17 ENCOUNTER — Other Ambulatory Visit: Payer: Self-pay

## 2017-09-17 DIAGNOSIS — E1159 Type 2 diabetes mellitus with other circulatory complications: Secondary | ICD-10-CM

## 2017-09-17 DIAGNOSIS — Z794 Long term (current) use of insulin: Principal | ICD-10-CM

## 2017-09-17 MED ORDER — INSULIN NPH (HUMAN) (ISOPHANE) 100 UNIT/ML ~~LOC~~ SUSP: SUBCUTANEOUS | 3 refills | Status: DC

## 2017-09-17 MED ORDER — INSULIN REGULAR HUMAN 100 UNIT/ML IJ SOLN: 25.0000 [IU] | Freq: Three times a day (TID) | INTRAMUSCULAR | 3 refills | Status: DC

## 2017-09-28 ENCOUNTER — Ambulatory Visit: Payer: Self-pay

## 2017-09-28 NOTE — Telephone Encounter (Signed)
Patient's wife PAT called in about a skin tear found on his the left great toe. She said when he took the shoe off, it was full of blood. She soaked the foot in epsom salt soak, dried and applied neosporin, then applied a dressing.  She said it did not look like it was infected, no redness, warmth, draining pus. Care instructions given per protocol, appointment for tomorrow is already made.  Reason for Disposition . [1] Has diabetes (diabetes mellitus) AND [2] minor cut or scratch on foot  Answer Assessment - Initial Assessment Questions 1. APPEARANCE of INJURY: "What does the injury look like?"      Skin curled up on his left big toe, posterior, cut the skin off. Posterior toe was blueish white after cleaning with epsom salt soak. Toe was flesh colored on the top of the toe. 2. SIZE: "How large is the cut?"      Maybe 3/4" long 3. BLEEDING: "Is it bleeding now?" If so, ask: "Is it difficult to stop?"      Not bleeding through dressing at this time 4. LOCATION: "Where is the injury located?"      Left posterior toe 5. ONSET: "How long ago did the injury occur?"      Unknown 6. MECHANISM: "Tell me how it happened."      Unknown. Blood was in the shoe when he took it off. 7. TETANUS: "When was the last tetanus booster?"     Unsure, it should be on the record 8. PREGNANCY: "Is there any chance you are pregnant?" "When was your last menstrual period?" N/A  Protocols used: [place]

## 2017-09-29 ENCOUNTER — Ambulatory Visit: Payer: Medicare HMO | Admitting: Medical

## 2017-09-29 ENCOUNTER — Encounter: Payer: Self-pay | Admitting: Medical

## 2017-09-29 ENCOUNTER — Other Ambulatory Visit: Payer: Self-pay

## 2017-09-29 VITALS — BP 135/53 | HR 63 | Temp 98.0°F | Resp 16 | Ht 70.0 in | Wt 236.2 lb

## 2017-09-29 DIAGNOSIS — S91109A Unspecified open wound of unspecified toe(s) without damage to nail, initial encounter: Secondary | ICD-10-CM

## 2017-09-29 DIAGNOSIS — L089 Local infection of the skin and subcutaneous tissue, unspecified: Secondary | ICD-10-CM | POA: Diagnosis not present

## 2017-09-29 MED ORDER — CLINDAMYCIN HCL 300 MG PO CAPS: 300.0000 mg | ORAL_CAPSULE | Freq: Three times a day (TID) | ORAL | 0 refills | Status: DC

## 2017-09-29 MED ORDER — MUPIROCIN 2 % EX OINT: TOPICAL_OINTMENT | CUTANEOUS | 0 refills | Status: DC

## 2017-09-29 MED FILL — CLINDAMYCIN HCL 300 MG CAPS: 300 | 10 days supply | Qty: 30 | Fill #0

## 2017-09-29 MED FILL — MUPIROCIN 2% OINTMENT: 2 | 10 days supply | Qty: 22 | Fill #0

## 2017-09-29 NOTE — Patient Instructions (Addendum)
With your recent wound of toe and diabetes, I am concerned for early skin infection.  I did get wound culture today and will see if that grows out any bacteria.  I wrote you mupirocin topical ointment to apply twice daily.  Also prescribed you clindamycin oral antibiotic.  Please continue oral probiotic and you could eat probiotic rich foods.  If while on antibiotic you get watery stools then recommend stop oral antibiotic and let us know.  Want you to make sure that you change your socks midday.  Avoid staying in moist socks.  If you get a chance and sitting around the house can leave your feet open to air.  If you see any dark blackish discoloration or any ulcer formation let us know immediately.  I am going to go ahead and try to get you in with either podiatrist or wound care.  This may be challenging over the upcoming holiday week.  Follow-up in 7 days or as needed.

## 2017-09-29 NOTE — Progress Notes (Signed)
Subjective:    Patient ID: William Hendrix, male    DOB: 02-Feb-1941, 76 y.o.   MRN: [id number]  HPI  Pt in states took his shoes off on Sunday. Saw some blood. He had no known trauma. No pain. Pt is diabetic. Wife states tissue was rolled up. And she trimmed.  Pt recent sugars controlled. 2 weeks ago a1-c 6.3.  Patient does have a history of diabetic neuropathy.      Review of Systems  Constitutional: Negative for chills, fatigue and fever.  Respiratory: Negative for cough, chest tightness, shortness of breath and wheezing.   Cardiovascular: Negative for chest pain and palpitations.  Musculoskeletal: Negative for back pain.  Skin:       Left great toe wound.  Neurological: Negative for dizziness, tremors, speech difficulty, weakness and headaches.  Hematological: Negative for adenopathy. Does not bruise/bleed easily.  Psychiatric/Behavioral: Negative for behavioral problems and confusion.    Past Medical History:  Diagnosis Date  . AAA (abdominal aortic aneurysm) ([place]) 12/15/2014   a. Mild aneurysmal dilatation of the infrarenal abdominal aorta 3.2 cm - f/u due by 2019.  . Arthritis    "all over" (07/30/2016)  . CAD (coronary artery disease)    a. stent to LAD 2000. b. possible spasm by cath 2001. c. IVUS/PTCA/DES to mLAD 05/2013. d. PTCA/DES of dRCA into ostial rPDA 07/2013. e. PTCA of OM2, DES to Cx, DES to [facility] 07/2016.  Marland Kitchen Chronic bronchitis ([place])   . Chronic diastolic CHF (congestive heart failure) ([place])   . Chronic lower back pain   . Chronic neck pain   . CKD (chronic kidney disease), stage III ([place])   . Diabetic peripheral neuropathy ([place])   . Ejection fraction    55%, 07/2010, mild inferior hypo  . GERD (gastroesophageal reflux disease)   . Gout   . H/O diverticulitis of colon 01/31/2015  . H/O hiatal hernia   . History of blood transfusion ~ 10/1940   "had pneumonia"  . HTN (hypertension)   . Hyperlipidemia   . Melanoma of forearm, left ([place]) 02/2011   with  wide excision   . Nephrolithiasis   . Obesity   . OSA on CPAP     [name] since 2000  . Positive D-dimer    a. significant elevation ,hospital 07/2010, etiology unclear. b. D Dimer chronically > 20.  . Skin cancer   . Spinal stenosis    a. s/p surgical repair 2013  . Type II diabetes mellitus ([place])   . Ventral hernia      Social History   Socioeconomic History  . Marital status: Married    Spouse name: Not on file  . Number of children: Not on file  . Years of education: Not on file  . Highest education level: Not on file  Social Needs  . Financial resource strain: Not on file  . Food insecurity - worry: Not on file  . Food insecurity - inability: Not on file  . Transportation needs - medical: Not on file  . Transportation needs - non-medical: Not on file  Occupational History  . Occupation: Painter  Tobacco Use  . Smoking status: Former Smoker    Packs/day: 3.00    Years: 30.00    Pack years: 90.00    Types: Cigarettes    Last attempt to quit: 09/17/1996    Years since quitting: 21.0  . Smokeless tobacco: Never Used  Substance and Sexual Activity  . Alcohol use: No  . Drug  use: No  . Sexual activity: Not Currently    Birth control/protection: None  Other Topics Concern  . Not on file  Social History Narrative   Married and lives locally with his wife.  Sharyon Cable.    Past Surgical History:  Procedure Laterality Date  . BACK SURGERY    . CARDIAC CATHETERIZATION N/A 07/30/2016   Procedure: Left Heart Cath and Coronary Angiography;  Surgeon: Nelva Bush, MD;  Location: [facility] CV LAB;  Service: Cardiovascular;  Laterality: N/A;  . CARDIAC CATHETERIZATION N/A 07/30/2016   Procedure: Coronary Stent Intervention;  Surgeon: Nelva Bush, MD;  Location: [facility];  Service: Cardiovascular;  Laterality: N/A;  Mid CFX and MID RCA  . CARDIAC CATHETERIZATION N/A 07/30/2016   Procedure: Coronary Balloon Angioplasty;  Surgeon: Nelva Bush, MD;   Location: [facility];  Service: Cardiovascular;  Laterality: N/A;  OM 1  . CARPAL TUNNEL RELEASE Left   . CATARACT EXTRACTION W/ INTRAOCULAR LENS  IMPLANT, BILATERAL Bilateral ~ 2014  . CERVICAL DISC SURGERY  1990s  . CORONARY ANGIOPLASTY WITH STENT PLACEMENT  05/2013  . CORONARY ANGIOPLASTY WITH STENT PLACEMENT  07/26/2013   DES to RCA extending to PDA    . CORONARY ANGIOPLASTY WITH STENT PLACEMENT  07/30/2016  . CORONARY ANGIOPLASTY WITH STENT PLACEMENT  2000   CAD  . DENTAL SURGERY  04/2016   "got infected; had to dig it out"  . KNEE ARTHROSCOPY Right 1990's   rt  . LEFT AND RIGHT HEART CATHETERIZATION WITH CORONARY ANGIOGRAM N/A 07/26/2013   Procedure: LEFT AND RIGHT HEART CATHETERIZATION WITH CORONARY ANGIOGRAM;  Surgeon: [name], MD;  Location: [facility] CATH LAB;  Service: Cardiovascular;  Laterality: N/A;  . LEFT HEART CATH AND CORONARY ANGIOGRAPHY N/A 02/26/2017   Procedure: Left Heart Cath and Coronary Angiography;  Surgeon: Sherren Mocha, MD;  Location: [facility];  Service: Cardiovascular;  Laterality: N/A;  . LEFT HEART CATHETERIZATION WITH CORONARY ANGIOGRAM N/A 05/19/2013   Procedure: LEFT HEART CATHETERIZATION WITH CORONARY ANGIOGRAM;  Surgeon: [name], MD;  Location: [facility] CATH LAB;  Service: Cardiovascular;  Laterality: N/A;  . LUMBAR LAMINECTOMY/DECOMPRESSION MICRODISCECTOMY  08/30/2012   Procedure: LUMBAR LAMINECTOMY/DECOMPRESSION MICRODISCECTOMY 2 LEVELS;  Surgeon: [name], MD;  Location: [place]URO [facility];  Service: Neurosurgery;  Laterality: Bilateral;  Bilateral Lumbar three-four Lumbar four-five Laminotomies  . LUMBAR SPINE SURGERY  04/07/2016   Dr. Ellene Route; "?ruptured disc"  . MELANOMA EXCISION Left 02/2011   forearm  . NASAL SINUS SURGERY  1970s   "cut windows in sinus pockets"  . PERCUTANEOUS CORONARY STENT INTERVENTION (PCI-S)  05/19/2013   Procedure: PERCUTANEOUS CORONARY STENT INTERVENTION (PCI-S);  Surgeon: [name], MD;   [facility] LAB;  Service: Cardiovascular;;  . ULNAR TUNNEL RELEASE Left 04/07/2016   Dr. Ellene Route    Family History  Problem Relation Age of Onset  . Cancer Mother        intestinal   . Heart disease Mother   . Cancer Father        prostate  . Migraines Daughter   . Leukemia Sister   . Heart disease Sister   . Prostate cancer Unknown   . Kidney cancer Unknown   . Cancer Unknown        Bladder cancer  . Coronary artery disease Unknown   . Hypertension Sister   . Hypertension Brother   . Heart attack Neg Hx   . Stroke Neg Hx     Allergies  Allergen Reactions  . Levemir [Insulin Detemir] Swelling    Patient had redness and swelling and tenderness at injection site.  . Codeine Other (See Comments)    Makes him "shakey", is OK with hydrocodone GI UPSET & TREMORS  . Lipitor [Atorvastatin] Other (See Comments)    Muscle aches; liver functions. Patient is currently taken  . Novolog Mix [Insulin Aspart Prot & Aspart] Other (See Comments)    Causes skin to swell/itch at injection site  . Other Other (See Comments)    Brilinta caused SOB    Current Outpatient Medications on File Prior to Visit  Medication Sig Dispense Refill  . acetaminophen (TYLENOL) 500 MG tablet Take 1,000 mg by mouth 2 (two) times daily.    Marland Kitchen albuterol (PROVENTIL HFA;VENTOLIN HFA) 108 (90 Base) MCG/ACT inhaler Inhale 2 puffs into the lungs every 6 (six) hours as needed for wheezing or shortness of breath. 1 Inhaler 0  . allopurinol (ZYLOPRIM) 300 MG tablet TAKE 1 TABLET EVERY DAY 90 tablet 1  . aspirin EC 81 MG tablet Take 81 mg by mouth daily.    Marland Kitchen atorvastatin (LIPITOR) 40 MG tablet Take 1 tablet (40 mg total) by mouth daily. 90 tablet 1  . B Complex Vitamins (B COMPLEX-B12 PO) Take 1 tablet by mouth at bedtime.    . BD INSULIN SYRINGE ULTRAFINE 31G X 5/16" 1 ML MISC USE  TO INJECT  INSULIN  FIVE  TIMES  DAILY AS INSTRUCTED 450 each 3  . benzonatate (TESSALON) 100 MG capsule Take 1 capsule (100 mg  total) by mouth 3 (three) times daily as needed for cough. 21 capsule 0  . Cholecalciferol (VITAMIN D) 1000 UNITS capsule Take 1,000 Units by mouth at bedtime.     . clopidogrel (PLAVIX) 75 MG tablet Take 1 tablet (75 mg total) by mouth daily. 90 tablet 3  . fenofibrate 160 MG tablet Take 160 mg by mouth daily.    . fluocinonide cream (LIDEX) 8.31 % Apply 1 application topically daily as needed. FOR RASH    . fluticasone (FLONASE) 50 MCG/ACT nasal spray Place 2 sprays into both nostrils daily. 16 g 1  . furosemide (LASIX) 40 MG tablet TAKE 1 AND 1/2 TABLETS EVERY DAY 135 tablet 2  . insulin NPH Human (NOVOLIN N) 100 UNIT/ML injection Inject 45 units in am and 55 in the evening 30 mL 3  . insulin regular (NOVOLIN R RELION) 100 units/mL injection Inject 0.25-0.3 mLs (25-30 Units total) into the skin 3 (three) times daily before meals. 70 mL 3  . isosorbide mononitrate (IMDUR) 30 MG 24 hr tablet Take 1 tablet (30 mg total) by mouth daily. 90 tablet 3  . metoprolol tartrate (LOPRESSOR) 50 MG tablet Take 1 tablet (50 mg total) by mouth 2 (two) times daily. 180 tablet 1  . nitroGLYCERIN (NITROSTAT) 0.4 MG SL tablet Place 1 tablet (0.4 mg total) under the tongue every 5 (five) minutes as needed for chest pain. 25 tablet 3  . pantoprazole (PROTONIX) 20 MG tablet Take 1 tablet (20 mg total) by mouth daily. 90 tablet 3  . tamsulosin (FLOMAX) 0.4 MG CAPS capsule Take 1 capsule (0.4 mg total) by mouth daily. 90 capsule 1  . tiZANidine (ZANAFLEX) 4 MG capsule Take 1 capsule (4 mg total) by mouth 4 (four) times daily as needed for muscle spasms. Take 1 tablet every 6 hours as needed; not to exceed 3 doses in 24 hours. 40 capsule 2  . TRUE METRIX BLOOD GLUCOSE TEST test  strip USE TO TEST BLOOD SUGAR THREE TIMES DAILY AS DIRECTED 300 each 3   No current facility-administered medications on file prior to visit.     BP (!) 135/53   Pulse 63   Temp 98 F (36.7 C) (Oral)   Resp 16   Ht 5\' 10"  (1.778 m)   Wt  236 lb 3.2 oz (107.1 kg)   SpO2 100%   BMI 33.89 kg/m       Objective:   Physical Exam  General- No acute distress. Pleasant patient. Neck- Full range of motion, no jvd Lungs- Clear, even and unlabored. Heart- regular rate and rhythm. Neurologic- CNII- XII grossly intact.   Left foot- No swelling. No redness. Left great toe mild redness at base of toenail. On bottom of her great tow 15 mm x 15 mm square shaped very shallow break down but just at level of epidermis. The skin around toe looks is macerated.      Assessment & Plan:  With your recent wound of  toe and diabetes, I am concerned for early skin infection.  I did get wound culture today and will see if that grows out any bacteria.  I wrote you mupirocin topical ointment to apply twice daily.  Also prescribed you clindamycin oral antibiotic.  Please continue oral probiotic and you could eat probiotic rich foods.  If while on antibiotic you get watery stools then recommend stop oral antibiotic and let us know.  Want you to make sure that you change your socks midday.  Avoid staying in moist socks.  If you get a chance and sitting around the house can leave your feet open to air.  If you see any dark blackish discoloration or any ulcer formation let us know immediately.  I am going to go ahead and try to get you in with either podiatrist or wound care.  This may be challenging over the upcoming holiday week.  Follow-up in 7 days or as needed.  Deontra Pereyra, Percell Miller, PA-C

## 2017-10-02 ENCOUNTER — Telehealth: Payer: Self-pay | Admitting: Medical

## 2017-10-02 LAB — WOUND CULTURE
MICRO NUMBER: 81427938
SPECIMEN QUALITY: ADEQUATE

## 2017-10-02 MED ORDER — LEVOFLOXACIN 500 MG PO TABS
500.0000 mg | ORAL_TABLET | Freq: Every day | ORAL | 0 refills | Status: DC
Start: 2017-10-02 — End: 2017-10-14

## 2017-10-02 NOTE — Telephone Encounter (Signed)
Rx levofloxin sent to pt pharmacy. Advised by my chart. See that result note.

## 2017-10-04 ENCOUNTER — Telehealth: Payer: Self-pay | Admitting: Family Medicine

## 2017-10-04 NOTE — Telephone Encounter (Signed)
Copied from [place]. Topic: Quick Communication - See Telephone Encounter >> Oct 04, 2017  9:01 AM Antonieta Iba C wrote: CRM for notification. See Telephone encounter for: pt is returning call. Please call back to discuss further.   10/04/17.

## 2017-10-04 NOTE — Telephone Encounter (Signed)
Patient was seen recently by Ginette Pitman did you call?  Please advise

## 2017-10-06 ENCOUNTER — Telehealth: Payer: Self-pay | Admitting: Internal Medicine

## 2017-10-06 NOTE — Telephone Encounter (Signed)
Pt is asking for a call back regarding the novolin N and R refills we called in. I attempted to help her but I was not successful.

## 2017-10-06 NOTE — Telephone Encounter (Signed)
Sent electronically on 12/6 and also faxed to the pharmacy on 12/13, pt is aware of all of that and went over how much of each medication was sent.

## 2017-10-15 ENCOUNTER — Telehealth: Payer: Self-pay | Admitting: Family Medicine

## 2017-10-15 MED ORDER — TAMSULOSIN HCL 0.4 MG PO CAPS: 0.4000 mg | ORAL_CAPSULE | Freq: Every day | ORAL | 1 refills | Status: DC

## 2017-10-15 MED ORDER — ATORVASTATIN CALCIUM 40 MG PO TABS: 40.0000 mg | ORAL_TABLET | Freq: Every day | ORAL | 1 refills | Status: DC

## 2017-10-15 MED ORDER — FENOFIBRATE 160 MG PO TABS: 160.0000 mg | ORAL_TABLET | Freq: Every day | ORAL | 1 refills | Status: DC

## 2017-10-15 MED ORDER — METOPROLOL TARTRATE 50 MG PO TABS: 50.0000 mg | ORAL_TABLET | Freq: Two times a day (BID) | ORAL | 1 refills | Status: DC

## 2017-10-15 NOTE — Telephone Encounter (Signed)
Copied from [place] [id number]. Topic: Quick Communication - Rx Refill/Question >> Oct 15, 2017 11:22 AM Oliver Pila B wrote: Pt called to get the following Rx's refilled, contact pt if needed   atorvastatin (LIPITOR) 40 MG tablet [[id number]]  fenofibrate 160 MG tablet [[id number]]  metoprolol tartrate (LOPRESSOR) 50 MG tablet [[id number]]  tamsulosin (FLOMAX) 0.4 MG CAPS capsule [[id number]]

## 2017-10-15 NOTE — Telephone Encounter (Signed)
rx sent in 

## 2017-10-19 NOTE — Pre-Procedure Instructions (Signed)
William Hendrix [facility]  10/19/2017      [facility] [place], [place] [address] [place] [address] Phone: [phone] Fax: [phone]    Your procedure is scheduled on Friday, October 22, 2017  Report to [facility] Admitting Entrance "A" at 5:30AM   Call this number if you have problems the morning of surgery:  [phone]   Remember:  Do not eat food or drink liquids after midnight.  Take these medicines the morning of surgery with A SIP OF WATER: Isosorbide mononitrate (IMDUR), Metoprolol tartrate (LOPRESSOR), Pantoprazole (PROTONIX), Tamsulosin (FLOMAX), Allopurinol (ZYLOPRIM), and Chlorpheniramine (CHLOR-TRIMETON). If needed Acetaminophen (TYLENOL) for pain, NitroGLYCERIN (NITROSTAT) for chest pain (Notify the nurse if you had to take this medicine), and Albuterol Inhaler for cough or wheezing (Bring with you the day of surgery)  Follow your doctor's instruction regarding Aspirin and Plavix.  As of today, stop taking all Aspirins, Vitamins, Fish oils, and Herbal medications. Also stop all NSAIDS i.e. Advil, Ibuprofen, Motrin, Aleve, Anaprox, Naproxen, BC and Goody Powders.   How to Manage Your Diabetes Before and After Surgery  Why is it important to control my blood sugar before and after surgery? . Improving blood sugar levels before and after surgery helps healing and can limit problems. . A way of improving blood sugar control is eating a healthy diet by: o  Eating less sugar and carbohydrates o  Increasing activity/exercise o  Talking with your doctor about reaching your blood sugar goals . High blood sugars (greater than 180 mg/dL) can raise your risk of infections and slow your recovery, so you will need to focus on controlling your diabetes during the weeks before surgery. . Make sure that the doctor who takes care of your diabetes knows about your planned surgery including the date and location.  How do  I manage my blood sugar before surgery? . Check your blood sugar at least 4 times a day, starting 2 days before surgery, to make sure that the level is not too high or low. o Check your blood sugar the morning of your surgery when you wake up and every 2 hours until you get to the Short Stay unit. . If your blood sugar is less than 70 mg/dL, you will need to treat for low blood sugar: o Do not take insulin. o Treat a low blood sugar (less than 70 mg/dL) with  cup of clear juice (cranberry or apple), 4 glucose tablets, OR glucose gel. Recheck blood sugar in 15 minutes after treatment (to make sure it is greater than 70 mg/dL). If your blood sugar is not greater than 70 mg/dL on recheck, call [phone] o  for further instructions. . Report your blood sugar to the short stay nurse when you get to Short Stay.  . If you are admitted to the hospital after surgery: o Your blood sugar will be checked by the staff and you will probably be given insulin after surgery (instead of oral diabetes medicines) to make sure you have good blood sugar levels. o The goal for blood sugar control after surgery is 80-180 mg/dL.  WHAT DO I DO ABOUT MY DIABETES MEDICATION?  . THE NIGHT BEFORE SURGERY, take _____25______ units of ______Novolin N_____insulin.      . THE MORNING OF SURGERY, take ______22_______ units of _____Novolin N_____insulin.  . If your CBG is greater than 220 mg/dL, you may take  of your sliding scale (correction) dose of insulin.  Do not wear jewelry.  Do not wear lotions, powders, colognes, or deodorant.  Do not shave 48 hours prior to surgery.  Men may shave face and neck.  Do not bring valuables to the hospital.  [facility] is not responsible for any belongings or valuables.  Contacts, dentures or bridgework may not be worn into surgery.  Leave your suitcase in the car.  After surgery it may be brought to your room.  For patients admitted to the hospital, discharge time will be  determined by your treatment team.  Patients discharged the day of surgery will not be allowed to drive home.   Special instructions:   [place]- Preparing For Surgery  Before surgery, you can play an important role. Because skin is not sterile, your skin needs to be as free of germs as possible. You can reduce the number of germs on your skin by washing with CHG (chlorahexidine gluconate) Soap before surgery.  CHG is an antiseptic cleaner which kills germs and bonds with the skin to continue killing germs even after washing.  Please do not use if you have an allergy to CHG or antibacterial soaps. If your skin becomes reddened/irritated stop using the CHG.  Do not shave (including legs and underarms) for at least 48 hours prior to first CHG shower. It is OK to shave your face.  Please follow these instructions carefully.   1. Shower the NIGHT BEFORE SURGERY and the MORNING OF SURGERY with CHG.   2. If you chose to wash your hair, wash your hair first as usual with your normal shampoo.  3. After you shampoo, rinse your hair and body thoroughly to remove the shampoo.  4. Use CHG as you would any other liquid soap. You can apply CHG directly to the skin and wash gently with a scrungie or a clean washcloth.   5. Apply the CHG Soap to your body ONLY FROM THE NECK DOWN.  Do not use on open wounds or open sores. Avoid contact with your eyes, ears, mouth and genitals (private parts). Wash Face and genitals (private parts)  with your normal soap.  6. Wash thoroughly, paying special attention to the area where your surgery will be performed.  7. Thoroughly rinse your body with warm water from the neck down.  8. DO NOT shower/wash with your normal soap after using and rinsing off the CHG Soap.  9. Pat yourself dry with a CLEAN TOWEL.  10. Wear CLEAN PAJAMAS to bed the night before surgery, wear comfortable clothes the morning of surgery  11. Place CLEAN SHEETS on your bed the night of your  first shower and DO NOT SLEEP WITH PETS.  Day of Surgery: Do not apply any deodorants/lotions. Please wear clean clothes to the hospital/surgery center.    Please read over the following fact sheets that you were given. Pain Booklet, Coughing and Deep Breathing, MRSA Information and Surgical Site Infection Prevention

## 2017-10-20 ENCOUNTER — Encounter (HOSPITAL_COMMUNITY)
Admission: RE | Admit: 2017-10-20 | Discharge: 2017-10-20 | Disposition: A | Discharge status: Home / Self Care | Payer: Medicare HMO | Source: Ambulatory Visit | Attending: Neurological Surgery | Admitting: Neurological Surgery

## 2017-10-20 ENCOUNTER — Encounter (HOSPITAL_COMMUNITY): Payer: Self-pay

## 2017-10-20 ENCOUNTER — Other Ambulatory Visit: Payer: Self-pay

## 2017-10-20 DIAGNOSIS — Z79899 Other long term (current) drug therapy: Secondary | ICD-10-CM

## 2017-10-20 DIAGNOSIS — Z87891 Personal history of nicotine dependence: Secondary | ICD-10-CM

## 2017-10-20 DIAGNOSIS — N189 Chronic kidney disease, unspecified: Secondary | ICD-10-CM | POA: Insufficient documentation

## 2017-10-20 DIAGNOSIS — Z981 Arthrodesis status: Secondary | ICD-10-CM

## 2017-10-20 DIAGNOSIS — M5441 Lumbago with sciatica, right side: Secondary | ICD-10-CM | POA: Diagnosis not present

## 2017-10-20 DIAGNOSIS — M47816 Spondylosis without myelopathy or radiculopathy, lumbar region: Secondary | ICD-10-CM | POA: Diagnosis not present

## 2017-10-20 DIAGNOSIS — E1122 Type 2 diabetes mellitus with diabetic chronic kidney disease: Secondary | ICD-10-CM

## 2017-10-20 DIAGNOSIS — I5032 Chronic diastolic (congestive) heart failure: Secondary | ICD-10-CM

## 2017-10-20 DIAGNOSIS — I130 Hypertensive heart and chronic kidney disease with heart failure and stage 1 through stage 4 chronic kidney disease, or unspecified chronic kidney disease: Secondary | ICD-10-CM | POA: Diagnosis present

## 2017-10-20 DIAGNOSIS — Z6836 Body mass index (BMI) 36.0-36.9, adult: Secondary | ICD-10-CM

## 2017-10-20 DIAGNOSIS — I110 Hypertensive heart disease with heart failure: Secondary | ICD-10-CM | POA: Insufficient documentation

## 2017-10-20 DIAGNOSIS — Z7902 Long term (current) use of antithrombotics/antiplatelets: Secondary | ICD-10-CM

## 2017-10-20 DIAGNOSIS — E785 Hyperlipidemia, unspecified: Secondary | ICD-10-CM

## 2017-10-20 DIAGNOSIS — Z85820 Personal history of malignant melanoma of skin: Secondary | ICD-10-CM

## 2017-10-20 DIAGNOSIS — E669 Obesity, unspecified: Secondary | ICD-10-CM

## 2017-10-20 DIAGNOSIS — E1140 Type 2 diabetes mellitus with diabetic neuropathy, unspecified: Secondary | ICD-10-CM | POA: Insufficient documentation

## 2017-10-20 DIAGNOSIS — K219 Gastro-esophageal reflux disease without esophagitis: Secondary | ICD-10-CM | POA: Diagnosis present

## 2017-10-20 DIAGNOSIS — Z9889 Other specified postprocedural states: Secondary | ICD-10-CM

## 2017-10-20 DIAGNOSIS — I2510 Atherosclerotic heart disease of native coronary artery without angina pectoris: Secondary | ICD-10-CM

## 2017-10-20 DIAGNOSIS — N183 Chronic kidney disease, stage 3 (moderate): Secondary | ICD-10-CM | POA: Diagnosis present

## 2017-10-20 DIAGNOSIS — M5126 Other intervertebral disc displacement, lumbar region: Secondary | ICD-10-CM | POA: Diagnosis not present

## 2017-10-20 DIAGNOSIS — Z01818 Encounter for other preprocedural examination: Secondary | ICD-10-CM | POA: Insufficient documentation

## 2017-10-20 DIAGNOSIS — M109 Gout, unspecified: Secondary | ICD-10-CM | POA: Diagnosis not present

## 2017-10-20 DIAGNOSIS — Z955 Presence of coronary angioplasty implant and graft: Secondary | ICD-10-CM

## 2017-10-20 DIAGNOSIS — I255 Ischemic cardiomyopathy: Secondary | ICD-10-CM

## 2017-10-20 DIAGNOSIS — Z87442 Personal history of urinary calculi: Secondary | ICD-10-CM | POA: Diagnosis not present

## 2017-10-20 DIAGNOSIS — I714 Abdominal aortic aneurysm, without rupture: Secondary | ICD-10-CM | POA: Diagnosis present

## 2017-10-20 DIAGNOSIS — Z7982 Long term (current) use of aspirin: Secondary | ICD-10-CM

## 2017-10-20 DIAGNOSIS — M48061 Spinal stenosis, lumbar region without neurogenic claudication: Secondary | ICD-10-CM | POA: Diagnosis not present

## 2017-10-20 DIAGNOSIS — Z961 Presence of intraocular lens: Secondary | ICD-10-CM | POA: Diagnosis present

## 2017-10-20 DIAGNOSIS — M4326 Fusion of spine, lumbar region: Secondary | ICD-10-CM | POA: Diagnosis not present

## 2017-10-20 DIAGNOSIS — I252 Old myocardial infarction: Secondary | ICD-10-CM | POA: Diagnosis not present

## 2017-10-20 DIAGNOSIS — E1142 Type 2 diabetes mellitus with diabetic polyneuropathy: Secondary | ICD-10-CM | POA: Diagnosis present

## 2017-10-20 DIAGNOSIS — J42 Unspecified chronic bronchitis: Secondary | ICD-10-CM | POA: Diagnosis present

## 2017-10-20 DIAGNOSIS — M48062 Spinal stenosis, lumbar region with neurogenic claudication: Secondary | ICD-10-CM | POA: Diagnosis present

## 2017-10-20 DIAGNOSIS — G4733 Obstructive sleep apnea (adult) (pediatric): Secondary | ICD-10-CM | POA: Insufficient documentation

## 2017-10-20 DIAGNOSIS — Z794 Long term (current) use of insulin: Secondary | ICD-10-CM | POA: Diagnosis not present

## 2017-10-20 DIAGNOSIS — K449 Diaphragmatic hernia without obstruction or gangrene: Secondary | ICD-10-CM | POA: Diagnosis present

## 2017-10-20 DIAGNOSIS — Z9841 Cataract extraction status, right eye: Secondary | ICD-10-CM | POA: Diagnosis not present

## 2017-10-20 DIAGNOSIS — Z9842 Cataract extraction status, left eye: Secondary | ICD-10-CM | POA: Diagnosis not present

## 2017-10-20 HISTORY — DX: Acute myocardial infarction, unspecified: I21.9

## 2017-10-20 HISTORY — DX: Personal history of urinary calculi: Z87.442

## 2017-10-20 HISTORY — DX: Spinal stenosis, lumbar region without neurogenic claudication: M48.061

## 2017-10-20 HISTORY — DX: Polyneuropathy, unspecified: G62.9

## 2017-10-20 LAB — BASIC METABOLIC PANEL
Anion gap: 8 (ref 5–15)
BUN: 24 mg/dL — AB (ref 6–20)
CO2: 26 mmol/L (ref 22–32)
CREATININE: 1.44 mg/dL — AB (ref 0.61–1.24)
Calcium: 9.8 mg/dL (ref 8.9–10.3)
Chloride: 105 mmol/L (ref 101–111)
GFR calc Af Amer: 53 mL/min — ABNORMAL LOW (ref >60)
GFR, EST NON AFRICAN AMERICAN: 45 mL/min — AB (ref >60)
GLUCOSE: 158 mg/dL — AB (ref 65–99)
POTASSIUM: 3.7 mmol/L (ref 3.5–5.1)
SODIUM: 139 mmol/L (ref 135–145)

## 2017-10-20 LAB — SURGICAL PCR SCREEN
MRSA, PCR: NEGATIVE
STAPHYLOCOCCUS AUREUS: NEGATIVE

## 2017-10-20 LAB — CBC
HEMATOCRIT: 42.3 % (ref 39.0–52.0)
Hemoglobin: 13.8 g/dL (ref 13.0–17.0)
MCH: 30.5 pg (ref 26.0–34.0)
MCHC: 32.6 g/dL (ref 30.0–36.0)
MCV: 93.4 fL (ref 78.0–100.0)
PLATELETS: 183 10*3/uL (ref 150–400)
RBC: 4.53 MIL/uL (ref 4.22–5.81)
RDW: 14 % (ref 11.5–15.5)
WBC: 6.7 10*3/uL (ref 4.0–10.5)

## 2017-10-20 LAB — TYPE AND SCREEN
ABO/RH(D): AB POS
ANTIBODY SCREEN: NEGATIVE

## 2017-10-20 LAB — ABO/RH: ABO/RH(D): AB POS

## 2017-10-20 LAB — GLUCOSE, CAPILLARY: GLUCOSE-CAPILLARY: 153 mg/dL — AB (ref 65–99)

## 2017-10-20 NOTE — Progress Notes (Signed)
PCP - Dr. Maryellen Pile  Cardiologist - Dr. Stanford Breed  Chest x-ray - 07/15/17 (E)  EKG - 07/15/17 (E)  Stress Test - 03/09/08 (E)  ECHO - 08/03/16 (E)  Cardiac Cath - 07/30/16 & 02/26/17 (E)  Sleep Study - Yes CPAP - Yes- Told to bring mask DOS  LABS- 10/20/17: CBC, BMP, T/S  HA1C- 09/09/17: 6.3 Fasting Blood Sugar - 140 + -, Today 153 Checks Blood Sugar __3___ times a day  Pt stopped taking Aspirin and Plavix on 10/15/17.  Anesthesia- Yes- Cardiac history  Pt denies having chest pain, sob, or fever at this time. All instructions explained to the pt, with a verbal understanding of the material. Pt agrees to go over the instructions while at home for a better understanding. The opportunity to ask questions was provided.

## 2017-10-21 ENCOUNTER — Telehealth: Payer: Self-pay | Admitting: Cardiology

## 2017-10-21 NOTE — Telephone Encounter (Signed)
Incoming call from [place] at Dr. Clarice Pole office. She needs a verbal that it is okay to hold the patient's aspirin for surgery tomorrow. He has been given prior approval to hold the Plavix. She needs a verbal about holding the aspirin. Message will be routed to the provider for his recommendation.

## 2017-10-21 NOTE — Telephone Encounter (Signed)
Would continue asa 81 mg daily given history of PCI. Kirk Ruths

## 2017-10-21 NOTE — Progress Notes (Addendum)
Anesthesia Chart Review: Patient is a 77 year old male scheduled for L2-3, L3-4, L4-5 anterior lateral lumbar interbody fusion with percutaneous pedicle screw fixation and Infuse on 10/22/2017 by Dr. [name].  History includes former smoker, CAD/MI (s/p stent LAD '01; DES LAD 05/2013; DES RCA and PDA 07/2013; PTCA OM2 and DES CX and RCA 07/2016), chronic diastolic CHF, HLD, HTN, CKD, DM2 with peripheral neuropathy, GERD, hiatal hernia, AAA (3.1 cm 12/2316, 3 year f/u rec), OSA (CPAP), chronic bronchitis, chronic back and neck pain, melanoma excision (left forearm), ventral hernia, gout, c-spine surgery '90's, L3-5 laminotomies 09/09/12. BMI is consistent with obesity.  - PCP is [name]. - Cardiologist is Dr. Kirk Ruths. He felt patient was okay for surgery and gave permission to discontinue Plavix. (See notations by both Dr. Stanford Breed and Jonelle Sidle, PA-C.) - Endocrinologist is Dr. [name].  Meds include ASA 81 mg (held 10/15/17), Plavix (stopped 10/15/17), albuterol, allopurinol, Lipitor, fenofibrate, Lasix, Novolin N, Novolin R Relion, Imdur, Lopressor, Nitro, Protonix, Flomax, Zanaflex. Cardiac clearance does not address holding ASA, so Jessica at Dr. Clarice Pole office to clarify with cardiology (and patient if needed). (UPDATE 10/21/17 12:14 PM: Dr. Stanford Breed would like patient to continue ASA perioperatively. Janett Billow has notified patient to resume ASA.)  BP (!) 123/53   Pulse 60   Resp 18   Ht 5' 10.5" (1.791 m)   Wt 261 lb 8 oz (118.6 kg)   SpO2 98%   BMI 36.99 kg/m    EKG 07/15/17: NSR, incomplete right BBB, LAFB.   Cardiac cath 02/26/17:  Left Main: Vessel is large. LAD: Vessel is large. Ostial LAD 20%. Proximal-mid LAD stent widely patent. LCX: Proximal CX 30%. Mid CX lesion with no stenosis was previously treated. Culprit lesion. The lesion is located at the major branch and eccentric. Haziness suggesting acute plaque rupture. OM1 vessel is small. OM2 vessel is moderate  with ostial 40%, previously treated. OM3 is a large vessel.  RCA: Mid RCA lesion with no stenosis was previously treated. The lesion is ulcerative. Lesion appears hazy concerning for unstable plaque. Previously placed distal RCA DES is widely patent. Previously placed ostial RPDA DES is widely patent. Conclusion: 1. Three-vessel coronary artery disease with continued patency of the stented segments in the right coronary artery, left circumflex, and LAD 2. Moderately elevated LVEDP Recommendations: The patient's angiogram as compared to his prior cath films from last year when he underwent 2 vessel PCI of the right coronary artery and left circumflex. His stents are widely patent and there is no change in coronary anatomy. I do not appreciate any obstructive disease. He should continue with medical therapy and work on lifestyle modification.  Echo 08/03/16: Study Conclusions - Left ventricle: The cavity size was normal. Wall thickness was   increased in a pattern of mild LVH. Systolic function was mildly   reduced. The estimated ejection fraction was in the range of 45%   to 50%. Doppler parameters are consistent with abnormal left   ventricular relaxation (grade 1 diastolic dysfunction). - Right ventricle: The cavity size was mildly dilated. Systolic   function was mildly reduced. - Pericardium, extracardiac: A trivial pericardial effusion was   identified. - Impressions: Poor acoustic windows limit study, even with the use   of Definity. Impressions: - Poor acoustic windows limit study, even with the use of Definity.  03/07/14 24 hour Holter monitor: NSR (59-115 bpm), average HR 79 bpm. Frequent PVCs. Rare couplets. No VT, SVT, or afib. No arrhythmia when patient reported  tachycardia or slight chest pain.   Abdominal aortic U/S 01/01/17: IMPRESSION: Stable aneurysm involving the mid abdominal portion of the aorta with a maximum transverse diameter 3.1 x 3.1 cm. No progression of aneurysm  or new site of aneurysm. There is atherosclerotic calcification and plaque in this area, similar to CT appearance. No periaortic fluid or adenopathy. No common iliac artery aneurysm evident. Recommend followup by ultrasound in 3 years. This recommendation follows ACR consensus guidelines: White Paper of the ACR Incidental Findings Committee II on Vascular Findings. J Am Coll Radiol 2013; 10:789-794.  CXR 07/15/17: IMPRESSION: There is no acute pneumonia nor CHF. Mild chronic bronchitic changes, stable.  Preoperative labs noted. Cr 1.44. CBC WNL. A1c 6.3 on 09/09/17.  If no acute changes then I would anticipate that he can proceed as planned.  George Hugh [facility]/Anesthesiology Phone [phone] 10/21/2017 11:16 AM

## 2017-10-21 NOTE — Telephone Encounter (Signed)
William Hendrix has been made aware of the need for the patient to take his 81 mg aspirin daily, per Dr. Stanford Breed.

## 2017-10-22 ENCOUNTER — Inpatient Hospital Stay (HOSPITAL_COMMUNITY): Payer: Medicare HMO

## 2017-10-22 ENCOUNTER — Inpatient Hospital Stay (HOSPITAL_COMMUNITY): Payer: Medicare HMO | Admitting: Vascular Surgery

## 2017-10-22 ENCOUNTER — Encounter (HOSPITAL_COMMUNITY)
Admission: RE | Disposition: A | Discharge status: Home / Self Care | Payer: Self-pay | Source: Ambulatory Visit | Attending: Neurological Surgery

## 2017-10-22 ENCOUNTER — Encounter (HOSPITAL_COMMUNITY): Payer: Self-pay | Admitting: *Deleted

## 2017-10-22 ENCOUNTER — Inpatient Hospital Stay (HOSPITAL_COMMUNITY): Payer: Medicare HMO | Admitting: Certified Registered Nurse Anesthetist

## 2017-10-22 ENCOUNTER — Inpatient Hospital Stay (HOSPITAL_COMMUNITY)
Admission: RE | Admit: 2017-10-22 | Discharge: 2017-10-25 | DRG: 460 | Disposition: A | Discharge status: Home / Self Care | Payer: Medicare HMO | Source: Ambulatory Visit | Attending: Neurological Surgery | Admitting: Neurological Surgery

## 2017-10-22 DIAGNOSIS — M4326 Fusion of spine, lumbar region: Secondary | ICD-10-CM | POA: Diagnosis not present

## 2017-10-22 DIAGNOSIS — I2510 Atherosclerotic heart disease of native coronary artery without angina pectoris: Secondary | ICD-10-CM | POA: Diagnosis present

## 2017-10-22 DIAGNOSIS — J42 Unspecified chronic bronchitis: Secondary | ICD-10-CM | POA: Diagnosis present

## 2017-10-22 DIAGNOSIS — K449 Diaphragmatic hernia without obstruction or gangrene: Secondary | ICD-10-CM | POA: Diagnosis present

## 2017-10-22 DIAGNOSIS — Z6836 Body mass index (BMI) 36.0-36.9, adult: Secondary | ICD-10-CM

## 2017-10-22 DIAGNOSIS — I5032 Chronic diastolic (congestive) heart failure: Secondary | ICD-10-CM | POA: Diagnosis present

## 2017-10-22 DIAGNOSIS — Z888 Allergy status to other drugs, medicaments and biological substances status: Secondary | ICD-10-CM

## 2017-10-22 DIAGNOSIS — E1142 Type 2 diabetes mellitus with diabetic polyneuropathy: Secondary | ICD-10-CM | POA: Diagnosis present

## 2017-10-22 DIAGNOSIS — Z7902 Long term (current) use of antithrombotics/antiplatelets: Secondary | ICD-10-CM

## 2017-10-22 DIAGNOSIS — Z794 Long term (current) use of insulin: Secondary | ICD-10-CM

## 2017-10-22 DIAGNOSIS — Z9841 Cataract extraction status, right eye: Secondary | ICD-10-CM

## 2017-10-22 DIAGNOSIS — Z87442 Personal history of urinary calculi: Secondary | ICD-10-CM

## 2017-10-22 DIAGNOSIS — Z79899 Other long term (current) drug therapy: Secondary | ICD-10-CM

## 2017-10-22 DIAGNOSIS — E785 Hyperlipidemia, unspecified: Secondary | ICD-10-CM | POA: Diagnosis present

## 2017-10-22 DIAGNOSIS — E1122 Type 2 diabetes mellitus with diabetic chronic kidney disease: Secondary | ICD-10-CM | POA: Diagnosis present

## 2017-10-22 DIAGNOSIS — I252 Old myocardial infarction: Secondary | ICD-10-CM

## 2017-10-22 DIAGNOSIS — Z9842 Cataract extraction status, left eye: Secondary | ICD-10-CM | POA: Diagnosis not present

## 2017-10-22 DIAGNOSIS — Z419 Encounter for procedure for purposes other than remedying health state, unspecified: Secondary | ICD-10-CM

## 2017-10-22 DIAGNOSIS — K219 Gastro-esophageal reflux disease without esophagitis: Secondary | ICD-10-CM | POA: Diagnosis present

## 2017-10-22 DIAGNOSIS — M4726 Other spondylosis with radiculopathy, lumbar region: Secondary | ICD-10-CM | POA: Diagnosis present

## 2017-10-22 DIAGNOSIS — Z961 Presence of intraocular lens: Secondary | ICD-10-CM | POA: Diagnosis present

## 2017-10-22 DIAGNOSIS — Z7982 Long term (current) use of aspirin: Secondary | ICD-10-CM | POA: Diagnosis not present

## 2017-10-22 DIAGNOSIS — Z87891 Personal history of nicotine dependence: Secondary | ICD-10-CM

## 2017-10-22 DIAGNOSIS — M5126 Other intervertebral disc displacement, lumbar region: Secondary | ICD-10-CM | POA: Diagnosis not present

## 2017-10-22 DIAGNOSIS — I130 Hypertensive heart and chronic kidney disease with heart failure and stage 1 through stage 4 chronic kidney disease, or unspecified chronic kidney disease: Secondary | ICD-10-CM | POA: Diagnosis present

## 2017-10-22 DIAGNOSIS — N183 Chronic kidney disease, stage 3 (moderate): Secondary | ICD-10-CM | POA: Diagnosis present

## 2017-10-22 DIAGNOSIS — I714 Abdominal aortic aneurysm, without rupture: Secondary | ICD-10-CM | POA: Diagnosis present

## 2017-10-22 DIAGNOSIS — M48062 Spinal stenosis, lumbar region with neurogenic claudication: Secondary | ICD-10-CM | POA: Diagnosis present

## 2017-10-22 DIAGNOSIS — M1990 Unspecified osteoarthritis, unspecified site: Secondary | ICD-10-CM | POA: Diagnosis present

## 2017-10-22 DIAGNOSIS — Z955 Presence of coronary angioplasty implant and graft: Secondary | ICD-10-CM | POA: Diagnosis not present

## 2017-10-22 DIAGNOSIS — Z885 Allergy status to narcotic agent status: Secondary | ICD-10-CM

## 2017-10-22 HISTORY — PX: ANTERIOR LAT LUMBAR FUSION: SHX1168

## 2017-10-22 HISTORY — PX: LUMBAR PERCUTANEOUS PEDICLE SCREW 1 LEVEL: SHX5560

## 2017-10-22 HISTORY — PX: APPLICATION OF ROBOTIC ASSISTANCE FOR SPINAL PROCEDURE: SHX6753

## 2017-10-22 LAB — POCT I-STAT 4, (NA,K, GLUC, HGB,HCT)
Glucose, Bld: 103 mg/dL — ABNORMAL HIGH (ref 65–99)
HEMATOCRIT: 36 % — AB (ref 39.0–52.0)
Hemoglobin: 12.2 g/dL — ABNORMAL LOW (ref 13.0–17.0)
Potassium: 4 mmol/L (ref 3.5–5.1)
SODIUM: 143 mmol/L (ref 135–145)

## 2017-10-22 LAB — GLUCOSE, CAPILLARY
Glucose-Capillary: 124 mg/dL — ABNORMAL HIGH (ref 65–99)
Glucose-Capillary: 181 mg/dL — ABNORMAL HIGH (ref 65–99)

## 2017-10-22 SURGERY — ANTERIOR LATERAL LUMBAR FUSION 3 LEVELS
Anesthesia: General | Site: Spine Lumbar | Laterality: Bilateral

## 2017-10-22 MED ORDER — POLYETHYLENE GLYCOL 3350 17 G PO PACK
17.0000 g | PACK | Freq: Every day | ORAL | Status: DC | PRN
  Administered 2017-10-24: 17 g via ORAL
  Filled 2017-10-22: qty 1

## 2017-10-22 MED ORDER — PHENYLEPHRINE HCL 10 MG/ML IJ SOLN
INTRAVENOUS | Status: DC | PRN
  Administered 2017-10-22: 25 ug/min via INTRAVENOUS

## 2017-10-22 MED ORDER — THROMBIN (RECOMBINANT) 5000 UNITS EX SOLR
CUTANEOUS | Status: AC
  Filled 2017-10-22: qty 5000

## 2017-10-22 MED ORDER — ALLOPURINOL 300 MG PO TABS
300.0000 mg | ORAL_TABLET | Freq: Every day | ORAL | Status: DC
  Administered 2017-10-23 – 2017-10-25 (×3): 300 mg via ORAL
  Filled 2017-10-22 (×3): qty 1

## 2017-10-22 MED ORDER — ONDANSETRON HCL 4 MG/2ML IJ SOLN
INTRAMUSCULAR | Status: AC
  Filled 2017-10-22: qty 2

## 2017-10-22 MED ORDER — ACETAMINOPHEN 325 MG PO TABS: 325.0000 mg | ORAL_TABLET | ORAL | Status: DC | PRN

## 2017-10-22 MED ORDER — DEXAMETHASONE SODIUM PHOSPHATE 10 MG/ML IJ SOLN
INTRAMUSCULAR | Status: DC | PRN
  Administered 2017-10-22: 10 mg via INTRAVENOUS

## 2017-10-22 MED ORDER — PANTOPRAZOLE SODIUM 20 MG PO TBEC
20.0000 mg | DELAYED_RELEASE_TABLET | Freq: Every day | ORAL | Status: DC
  Administered 2017-10-23 – 2017-10-25 (×3): 20 mg via ORAL
  Filled 2017-10-22 (×3): qty 1

## 2017-10-22 MED ORDER — SUCCINYLCHOLINE CHLORIDE 20 MG/ML IJ SOLN
INTRAMUSCULAR | Status: DC | PRN
  Administered 2017-10-22: 100 mg via INTRAVENOUS

## 2017-10-22 MED ORDER — HYDROMORPHONE HCL 1 MG/ML IJ SOLN
INTRAMUSCULAR | Status: AC
  Filled 2017-10-22: qty 1

## 2017-10-22 MED ORDER — CEFAZOLIN SODIUM-DEXTROSE 2-4 GM/100ML-% IV SOLN
2.0000 g | Freq: Three times a day (TID) | INTRAVENOUS | Status: AC
  Administered 2017-10-22 – 2017-10-23 (×2): 2 g via INTRAVENOUS
  Filled 2017-10-22 (×2): qty 100

## 2017-10-22 MED ORDER — ISOSORBIDE MONONITRATE ER 30 MG PO TB24
30.0000 mg | ORAL_TABLET | Freq: Every day | ORAL | Status: DC
  Administered 2017-10-23 – 2017-10-25 (×3): 30 mg via ORAL
  Filled 2017-10-22 (×3): qty 1

## 2017-10-22 MED ORDER — CEFAZOLIN SODIUM-DEXTROSE 2-4 GM/100ML-% IV SOLN
2.0000 g | INTRAVENOUS | Status: AC
  Administered 2017-10-22: 2 g via INTRAVENOUS

## 2017-10-22 MED ORDER — ACETAMINOPHEN 325 MG PO TABS
650.0000 mg | ORAL_TABLET | ORAL | Status: DC | PRN
Start: 2017-10-22 — End: 2017-10-25
  Administered 2017-10-24: 650 mg via ORAL
  Filled 2017-10-22: qty 2

## 2017-10-22 MED ORDER — HYDROMORPHONE HCL 1 MG/ML IJ SOLN
INTRAMUSCULAR | Status: DC | PRN
  Administered 2017-10-22: 0.5 mg via INTRAVENOUS

## 2017-10-22 MED ORDER — DEXAMETHASONE SODIUM PHOSPHATE 10 MG/ML IJ SOLN
INTRAMUSCULAR | Status: AC
  Filled 2017-10-22: qty 1

## 2017-10-22 MED ORDER — NITROGLYCERIN 0.4 MG SL SUBL: 0.4000 mg | SUBLINGUAL_TABLET | SUBLINGUAL | Status: DC | PRN

## 2017-10-22 MED ORDER — ROCURONIUM BROMIDE 10 MG/ML (PF) SYRINGE
PREFILLED_SYRINGE | INTRAVENOUS | Status: AC
  Filled 2017-10-22: qty 5

## 2017-10-22 MED ORDER — FUROSEMIDE 80 MG PO TABS
80.0000 mg | ORAL_TABLET | Freq: Every day | ORAL | Status: DC
  Administered 2017-10-23 – 2017-10-25 (×3): 80 mg via ORAL
  Filled 2017-10-22 (×3): qty 1

## 2017-10-22 MED ORDER — THROMBIN (RECOMBINANT) 20000 UNITS EX SOLR
CUTANEOUS | Status: AC
  Filled 2017-10-22: qty 20000

## 2017-10-22 MED ORDER — PROPOFOL 10 MG/ML IV BOLUS
INTRAVENOUS | Status: AC
  Filled 2017-10-22: qty 40

## 2017-10-22 MED ORDER — FENTANYL CITRATE (PF) 100 MCG/2ML IJ SOLN
INTRAMUSCULAR | Status: DC | PRN
  Administered 2017-10-22 (×3): 50 ug via INTRAVENOUS
  Administered 2017-10-22: 100 ug via INTRAVENOUS
  Administered 2017-10-22 (×5): 50 ug via INTRAVENOUS

## 2017-10-22 MED ORDER — LACTATED RINGERS IV SOLN
INTRAVENOUS | Status: DC
  Administered 2017-10-22: 19:00:00 via INTRAVENOUS

## 2017-10-22 MED ORDER — PHENOL 1.4 % MT LIQD: 1.0000 | OROMUCOSAL | Status: DC | PRN

## 2017-10-22 MED ORDER — SENNA 8.6 MG PO TABS
1.0000 | ORAL_TABLET | Freq: Two times a day (BID) | ORAL | Status: DC
  Administered 2017-10-22 – 2017-10-25 (×6): 8.6 mg via ORAL
  Filled 2017-10-22 (×6): qty 1

## 2017-10-22 MED ORDER — HYDROMORPHONE HCL 1 MG/ML IJ SOLN
INTRAMUSCULAR | Status: AC
Start: 2017-10-22 — End: 2017-10-22
  Filled 2017-10-22: qty 0.5

## 2017-10-22 MED ORDER — THROMBIN (RECOMBINANT) 5000 UNITS EX SOLR
OROMUCOSAL | Status: DC | PRN
  Administered 2017-10-22: 5 mL via TOPICAL

## 2017-10-22 MED ORDER — LACTATED RINGERS IV SOLN
INTRAVENOUS | Status: DC | PRN
  Administered 2017-10-22 (×3): via INTRAVENOUS

## 2017-10-22 MED ORDER — SODIUM CHLORIDE 0.9 % IV SOLN
250.0000 mL | INTRAVENOUS | Status: DC
  Administered 2017-10-23: 250 mL via INTRAVENOUS

## 2017-10-22 MED ORDER — CHLORHEXIDINE GLUCONATE CLOTH 2 % EX PADS: 6.0000 | MEDICATED_PAD | Freq: Once | CUTANEOUS | Status: DC

## 2017-10-22 MED ORDER — ALUM & MAG HYDROXIDE-SIMETH 200-200-20 MG/5ML PO SUSP: 30.0000 mL | Freq: Four times a day (QID) | ORAL | Status: DC | PRN

## 2017-10-22 MED ORDER — ROCURONIUM BROMIDE 10 MG/ML (PF) SYRINGE
PREFILLED_SYRINGE | INTRAVENOUS | Status: DC | PRN
  Administered 2017-10-22: 20 mg via INTRAVENOUS
  Administered 2017-10-22: 50 mg via INTRAVENOUS

## 2017-10-22 MED ORDER — SODIUM CHLORIDE 0.9 % IJ SOLN
INTRAMUSCULAR | Status: AC
Start: 2017-10-22 — End: 2017-10-22
  Filled 2017-10-22: qty 10

## 2017-10-22 MED ORDER — INSULIN NPH (HUMAN) (ISOPHANE) 100 UNIT/ML ~~LOC~~ SUSP
45.0000 [IU] | Freq: Every day | SUBCUTANEOUS | Status: DC
  Administered 2017-10-23 – 2017-10-25 (×3): 45 [IU] via SUBCUTANEOUS
  Filled 2017-10-22: qty 10

## 2017-10-22 MED ORDER — DOCUSATE SODIUM 100 MG PO CAPS
100.0000 mg | ORAL_CAPSULE | Freq: Two times a day (BID) | ORAL | Status: DC
  Administered 2017-10-22 – 2017-10-25 (×6): 100 mg via ORAL
  Filled 2017-10-22 (×6): qty 1

## 2017-10-22 MED ORDER — ACETAMINOPHEN 650 MG RE SUPP: 650.0000 mg | RECTAL | Status: DC | PRN

## 2017-10-22 MED ORDER — HYDROCODONE-ACETAMINOPHEN 5-325 MG PO TABS
2.0000 | ORAL_TABLET | ORAL | Status: DC | PRN
  Administered 2017-10-22 – 2017-10-25 (×11): 2 via ORAL
  Filled 2017-10-22 (×11): qty 2

## 2017-10-22 MED ORDER — SODIUM CHLORIDE 0.9 % IR SOLN
Status: DC | PRN
  Administered 2017-10-22: 500 mL

## 2017-10-22 MED ORDER — LIDOCAINE-EPINEPHRINE 1 %-1:100000 IJ SOLN
INTRAMUSCULAR | Status: DC | PRN
  Administered 2017-10-22: 20 mL

## 2017-10-22 MED ORDER — SODIUM CHLORIDE 0.9% FLUSH
3.0000 mL | Freq: Two times a day (BID) | INTRAVENOUS | Status: DC
  Administered 2017-10-22 – 2017-10-25 (×6): 3 mL via INTRAVENOUS

## 2017-10-22 MED ORDER — FENTANYL CITRATE (PF) 250 MCG/5ML IJ SOLN
INTRAMUSCULAR | Status: AC
  Filled 2017-10-22: qty 5

## 2017-10-22 MED ORDER — FENOFIBRATE 160 MG PO TABS
160.0000 mg | ORAL_TABLET | Freq: Every day | ORAL | Status: DC
  Administered 2017-10-22 – 2017-10-24 (×3): 160 mg via ORAL
  Filled 2017-10-22 (×3): qty 1

## 2017-10-22 MED ORDER — TAMSULOSIN HCL 0.4 MG PO CAPS
0.4000 mg | ORAL_CAPSULE | Freq: Every day | ORAL | Status: DC
  Administered 2017-10-23 – 2017-10-25 (×3): 0.4 mg via ORAL
  Filled 2017-10-22 (×3): qty 1

## 2017-10-22 MED ORDER — LIDOCAINE 2% (20 MG/ML) 5 ML SYRINGE
INTRAMUSCULAR | Status: AC
  Filled 2017-10-22: qty 5

## 2017-10-22 MED ORDER — MORPHINE SULFATE (PF) 4 MG/ML IV SOLN
4.0000 mg | INTRAVENOUS | Status: DC | PRN
  Administered 2017-10-22 (×2): 4 mg via INTRAVENOUS
  Filled 2017-10-22: qty 1

## 2017-10-22 MED ORDER — FLEET ENEMA 7-19 GM/118ML RE ENEM: 1.0000 | ENEMA | Freq: Once | RECTAL | Status: DC | PRN

## 2017-10-22 MED ORDER — GLYCOPYRROLATE 0.2 MG/ML IJ SOLN
INTRAMUSCULAR | Status: DC | PRN
  Administered 2017-10-22: 0.2 mg via INTRAVENOUS

## 2017-10-22 MED ORDER — EPHEDRINE SULFATE-NACL 50-0.9 MG/10ML-% IV SOSY
PREFILLED_SYRINGE | INTRAVENOUS | Status: DC | PRN
  Administered 2017-10-22: 10 mg via INTRAVENOUS
  Administered 2017-10-22: 5 mg via INTRAVENOUS
  Administered 2017-10-22 (×2): 10 mg via INTRAVENOUS

## 2017-10-22 MED ORDER — SUCCINYLCHOLINE CHLORIDE 200 MG/10ML IV SOSY
PREFILLED_SYRINGE | INTRAVENOUS | Status: AC
  Filled 2017-10-22: qty 10

## 2017-10-22 MED ORDER — OXYCODONE HCL 5 MG/5ML PO SOLN: 5.0000 mg | Freq: Once | ORAL | Status: DC | PRN

## 2017-10-22 MED ORDER — HYDROCODONE-ACETAMINOPHEN 5-325 MG PO TABS: 1.0000 | ORAL_TABLET | ORAL | Status: DC | PRN

## 2017-10-22 MED ORDER — INSULIN NPH (HUMAN) (ISOPHANE) 100 UNIT/ML ~~LOC~~ SUSP
55.0000 [IU] | Freq: Every day | SUBCUTANEOUS | Status: DC
  Administered 2017-10-22 – 2017-10-24 (×3): 55 [IU] via SUBCUTANEOUS
  Filled 2017-10-22: qty 10

## 2017-10-22 MED ORDER — SUGAMMADEX SODIUM 500 MG/5ML IV SOLN
INTRAVENOUS | Status: DC | PRN
  Administered 2017-10-22: 250 mg via INTRAVENOUS

## 2017-10-22 MED ORDER — MENTHOL 3 MG MT LOZG: 1.0000 | LOZENGE | OROMUCOSAL | Status: DC | PRN

## 2017-10-22 MED ORDER — METHOCARBAMOL 1000 MG/10ML IJ SOLN
500.0000 mg | Freq: Four times a day (QID) | INTRAVENOUS | Status: DC | PRN
  Filled 2017-10-22: qty 5

## 2017-10-22 MED ORDER — LIDOCAINE 2% (20 MG/ML) 5 ML SYRINGE
INTRAMUSCULAR | Status: DC | PRN
  Administered 2017-10-22: 60 mg via INTRAVENOUS

## 2017-10-22 MED ORDER — ALBUTEROL SULFATE (2.5 MG/3ML) 0.083% IN NEBU: 3.0000 mL | INHALATION_SOLUTION | Freq: Four times a day (QID) | RESPIRATORY_TRACT | Status: DC | PRN

## 2017-10-22 MED ORDER — METHOCARBAMOL 500 MG PO TABS
ORAL_TABLET | ORAL | Status: AC
  Filled 2017-10-22: qty 1

## 2017-10-22 MED ORDER — METHOCARBAMOL 500 MG PO TABS
500.0000 mg | ORAL_TABLET | Freq: Four times a day (QID) | ORAL | Status: DC | PRN
  Administered 2017-10-22 – 2017-10-25 (×6): 500 mg via ORAL
  Filled 2017-10-22 (×5): qty 1

## 2017-10-22 MED ORDER — ONDANSETRON HCL 4 MG/2ML IJ SOLN: 4.0000 mg | Freq: Four times a day (QID) | INTRAMUSCULAR | Status: DC | PRN

## 2017-10-22 MED ORDER — PROPOFOL 10 MG/ML IV BOLUS
INTRAVENOUS | Status: DC | PRN
  Administered 2017-10-22: 40 mg via INTRAVENOUS
  Administered 2017-10-22: 70 mg via INTRAVENOUS

## 2017-10-22 MED ORDER — HYDROMORPHONE HCL 1 MG/ML IJ SOLN
1.0000 mg | INTRAMUSCULAR | Status: DC | PRN
  Administered 2017-10-22 – 2017-10-25 (×11): 1 mg via INTRAVENOUS
  Filled 2017-10-22 (×11): qty 1

## 2017-10-22 MED ORDER — LIDOCAINE-EPINEPHRINE 1 %-1:100000 IJ SOLN
INTRAMUSCULAR | Status: AC
  Filled 2017-10-22: qty 1

## 2017-10-22 MED ORDER — ATORVASTATIN CALCIUM 40 MG PO TABS
40.0000 mg | ORAL_TABLET | Freq: Every day | ORAL | Status: DC
  Administered 2017-10-23 – 2017-10-25 (×3): 40 mg via ORAL
  Filled 2017-10-22 (×3): qty 1

## 2017-10-22 MED ORDER — BUPIVACAINE HCL (PF) 0.5 % IJ SOLN
INTRAMUSCULAR | Status: DC | PRN
  Administered 2017-10-22: 20 mL

## 2017-10-22 MED ORDER — ONDANSETRON HCL 4 MG PO TABS: 4.0000 mg | ORAL_TABLET | Freq: Four times a day (QID) | ORAL | Status: DC | PRN

## 2017-10-22 MED ORDER — 0.9 % SODIUM CHLORIDE (POUR BTL) OPTIME
TOPICAL | Status: DC | PRN
  Administered 2017-10-22: 1000 mL

## 2017-10-22 MED ORDER — OXYCODONE HCL 5 MG PO TABS: 5.0000 mg | ORAL_TABLET | Freq: Once | ORAL | Status: DC | PRN

## 2017-10-22 MED ORDER — CEFAZOLIN SODIUM 1 G IJ SOLR
INTRAMUSCULAR | Status: AC
  Filled 2017-10-22: qty 20

## 2017-10-22 MED ORDER — HYDROMORPHONE HCL 1 MG/ML IJ SOLN
0.2500 mg | INTRAMUSCULAR | Status: DC | PRN
  Administered 2017-10-22 (×2): 0.5 mg via INTRAVENOUS

## 2017-10-22 MED ORDER — BISACODYL 10 MG RE SUPP
10.0000 mg | Freq: Every day | RECTAL | Status: DC | PRN
  Administered 2017-10-24: 10 mg via RECTAL
  Filled 2017-10-22: qty 1

## 2017-10-22 MED ORDER — BUPIVACAINE HCL (PF) 0.5 % IJ SOLN
INTRAMUSCULAR | Status: AC
  Filled 2017-10-22: qty 30

## 2017-10-22 MED ORDER — RISAQUAD PO CAPS
ORAL_CAPSULE | Freq: Every day | ORAL | Status: DC
  Administered 2017-10-23 – 2017-10-25 (×3): 1 via ORAL
  Filled 2017-10-22 (×4): qty 1

## 2017-10-22 MED ORDER — LACTATED RINGERS IV SOLN
INTRAVENOUS | Status: DC | PRN
  Administered 2017-10-22: 07:00:00 via INTRAVENOUS

## 2017-10-22 MED ORDER — MORPHINE SULFATE (PF) 4 MG/ML IV SOLN
INTRAVENOUS | Status: AC
  Filled 2017-10-22: qty 1

## 2017-10-22 MED ORDER — HYDROMORPHONE HCL 1 MG/ML IJ SOLN
1.0000 mg | INTRAMUSCULAR | Status: DC | PRN
Start: 2017-10-22 — End: 2017-10-22

## 2017-10-22 MED ORDER — METOPROLOL TARTRATE 50 MG PO TABS
50.0000 mg | ORAL_TABLET | Freq: Two times a day (BID) | ORAL | Status: DC
  Administered 2017-10-22 – 2017-10-25 (×6): 50 mg via ORAL
  Filled 2017-10-22 (×6): qty 1

## 2017-10-22 MED ORDER — SODIUM CHLORIDE 0.9% FLUSH: 3.0000 mL | INTRAVENOUS | Status: DC | PRN

## 2017-10-22 MED ORDER — SUGAMMADEX SODIUM 500 MG/5ML IV SOLN
INTRAVENOUS | Status: AC
  Filled 2017-10-22: qty 5

## 2017-10-22 MED ORDER — CEFAZOLIN SODIUM-DEXTROSE 2-3 GM-%(50ML) IV SOLR
INTRAVENOUS | Status: DC | PRN
  Administered 2017-10-22: 2 g via INTRAVENOUS

## 2017-10-22 MED ORDER — ACETAMINOPHEN 160 MG/5ML PO SOLN: 325.0000 mg | ORAL | Status: DC | PRN

## 2017-10-22 MED ORDER — PROPOFOL 500 MG/50ML IV EMUL
INTRAVENOUS | Status: DC | PRN
  Administered 2017-10-22: 50 ug/kg/min via INTRAVENOUS

## 2017-10-22 MED ORDER — ALBUMIN HUMAN 5 % IV SOLN
INTRAVENOUS | Status: DC | PRN
  Administered 2017-10-22 (×2): via INTRAVENOUS

## 2017-10-22 MED ORDER — ONDANSETRON HCL 4 MG/2ML IJ SOLN
INTRAMUSCULAR | Status: DC | PRN
  Administered 2017-10-22: 4 mg via INTRAVENOUS

## 2017-10-22 SURGICAL SUPPLY — 83 items
ADH SKN CLS APL DERMABOND .7 (GAUZE/BANDAGES/DRESSINGS) ×6
APL SKNCLS STERI-STRIP NONHPOA (GAUZE/BANDAGES/DRESSINGS) ×2
BAG DECANTER FOR FLEXI CONT (MISCELLANEOUS) ×4 IMPLANT
BENZOIN TINCTURE PRP APPL 2/3 (GAUZE/BANDAGES/DRESSINGS) ×4 IMPLANT
BIT DRILL LONG 3.0X30 (BIT) ×3 IMPLANT
BIT DRILL LONG 3.0X30MM (BIT) ×1
BLADE CLIPPER SURG (BLADE) IMPLANT
BONE CANC CHIPS 40CC CAN1/2 (Bone Implant) ×4 IMPLANT
CAGE MODULUS XLW 10X22X60 - 10 (Cage) ×4 IMPLANT
CARTRIDGE OIL MAESTRO DRILL (MISCELLANEOUS) IMPLANT
CHIPS CANC BONE 40CC CAN1/2 (Bone Implant) ×2 IMPLANT
CLOSURE WOUND 1/2 X4 (GAUZE/BANDAGES/DRESSINGS) ×1
CONT SPEC 4OZ CLIKSEAL STRL BL (MISCELLANEOUS) ×4 IMPLANT
COVER BACK TABLE 24X17X13 BIG (DRAPES) IMPLANT
COVER BACK TABLE 60X90IN (DRAPES) ×4 IMPLANT
DERMABOND ADVANCED (GAUZE/BANDAGES/DRESSINGS) ×6
DERMABOND ADVANCED .7 DNX12 (GAUZE/BANDAGES/DRESSINGS) ×6 IMPLANT
DIFFUSER DRILL AIR PNEUMATIC (MISCELLANEOUS) IMPLANT
DRAPE C-ARM 42X72 X-RAY (DRAPES) ×8 IMPLANT
DRAPE C-ARMOR (DRAPES) ×8 IMPLANT
DRAPE LAPAROTOMY 100X72X124 (DRAPES) ×8 IMPLANT
DRAPE POUCH INSTRU U-SHP 10X18 (DRAPES) ×8 IMPLANT
DRAPE SURG 17X23 STRL (DRAPES) ×4 IMPLANT
DRSG OPSITE POSTOP 3X4 (GAUZE/BANDAGES/DRESSINGS) ×12 IMPLANT
DRSG OPSITE POSTOP 4X8 (GAUZE/BANDAGES/DRESSINGS) ×8 IMPLANT
DURAPREP 26ML APPLICATOR (WOUND CARE) ×8 IMPLANT
ELECT REM PT RETURN 9FT ADLT (ELECTROSURGICAL) ×8
ELECTRODE REM PT RTRN 9FT ADLT (ELECTROSURGICAL) ×4 IMPLANT
GAUZE SPONGE 4X4 12PLY STRL (GAUZE/BANDAGES/DRESSINGS) IMPLANT
GAUZE SPONGE 4X4 16PLY XRAY LF (GAUZE/BANDAGES/DRESSINGS) ×4 IMPLANT
GLOVE BIOGEL PI IND STRL 6.5 (GLOVE) ×2 IMPLANT
GLOVE BIOGEL PI IND STRL 7.5 (GLOVE) ×6 IMPLANT
GLOVE BIOGEL PI IND STRL 8.5 (GLOVE) ×4 IMPLANT
GLOVE BIOGEL PI INDICATOR 6.5 (GLOVE) ×2
GLOVE BIOGEL PI INDICATOR 7.5 (GLOVE) ×6
GLOVE BIOGEL PI INDICATOR 8.5 (GLOVE) ×4
GLOVE ECLIPSE 8.5 STRL (GLOVE) ×16 IMPLANT
GLOVE EXAM NITRILE LRG STRL (GLOVE) IMPLANT
GLOVE EXAM NITRILE XL STR (GLOVE) IMPLANT
GLOVE EXAM NITRILE XS STR PU (GLOVE) IMPLANT
GLOVE SURG SS PI 7.0 STRL IVOR (GLOVE) ×12 IMPLANT
GOWN STRL REUS W/ TWL LRG LVL3 (GOWN DISPOSABLE) IMPLANT
GOWN STRL REUS W/ TWL XL LVL3 (GOWN DISPOSABLE) ×6 IMPLANT
GOWN STRL REUS W/TWL 2XL LVL3 (GOWN DISPOSABLE) ×12 IMPLANT
GOWN STRL REUS W/TWL LRG LVL3 (GOWN DISPOSABLE)
GOWN STRL REUS W/TWL XL LVL3 (GOWN DISPOSABLE) ×12
GUIDEWIRE NITINOL BEVEL TIP (WIRE) ×32 IMPLANT
KIT BASIN OR (CUSTOM PROCEDURE TRAY) ×8 IMPLANT
KIT DILATOR XLIF 5 (KITS) ×3 IMPLANT
KIT INFUSE SMALL (Orthopedic Implant) ×4 IMPLANT
KIT ROOM TURNOVER OR (KITS) ×4 IMPLANT
KIT SPINE MAZOR X ROBO DISP (MISCELLANEOUS) ×4 IMPLANT
KIT SURGICAL ACCESS MAXCESS 4 (KITS) ×4 IMPLANT
KIT XLIF (KITS) ×1
MARKER SKIN DUAL TIP RULER LAB (MISCELLANEOUS) ×8 IMPLANT
MODULE NVM5 NEXT GEN EMG (NEEDLE) ×4 IMPLANT
MODULUS XLW 12X22X55MM 10 (Spine Construct) ×4 IMPLANT
MODULUS XLW 12X22X60MM 10 (Spine Construct) ×4 IMPLANT
NEEDLE HYPO 25X1 1.5 SAFETY (NEEDLE) ×8 IMPLANT
NS IRRIG 1000ML POUR BTL (IV SOLUTION) ×4 IMPLANT
OIL CARTRIDGE MAESTRO DRILL (MISCELLANEOUS)
PACK LAMINECTOMY NEURO (CUSTOM PROCEDURE TRAY) ×8 IMPLANT
PAD ARMBOARD 7.5X6 YLW CONV (MISCELLANEOUS) ×20 IMPLANT
PATTIES SURGICAL .5 X.5 (GAUZE/BANDAGES/DRESSINGS) IMPLANT
PATTIES SURGICAL .5 X1 (DISPOSABLE) ×4 IMPLANT
PATTIES SURGICAL 1X1 (DISPOSABLE) IMPLANT
ROD RELINE MAS LORD 5.5X100MM (Rod) ×8 IMPLANT
SCREW LOCK RELINE 5.5 TULIP (Screw) ×32 IMPLANT
SCREW MAS RELINE 6.5X45 POLY (Screw) ×16 IMPLANT
SCREW RELINE MAS 7.5X50MM POLY (Screw) ×16 IMPLANT
SCREW SCHANZ 4MMX80 (MISCELLANEOUS) ×4 IMPLANT
SPONGE LAP 4X18 X RAY DECT (DISPOSABLE) IMPLANT
STAPLER SKIN PROX WIDE 3.9 (STAPLE) ×4 IMPLANT
STRIP CLOSURE SKIN 1/2X4 (GAUZE/BANDAGES/DRESSINGS) ×3 IMPLANT
SUT VIC AB 1 CT1 18XBRD ANBCTR (SUTURE) ×4 IMPLANT
SUT VIC AB 1 CT1 8-18 (SUTURE) ×8
SUT VIC AB 2-0 CP2 18 (SUTURE) ×12 IMPLANT
SUT VIC AB 3-0 SH 8-18 (SUTURE) ×20 IMPLANT
TOWEL GREEN STERILE (TOWEL DISPOSABLE) ×4 IMPLANT
TOWEL GREEN STERILE FF (TOWEL DISPOSABLE) ×4 IMPLANT
TRAY FOLEY W/METER SILVER 16FR (SET/KITS/TRAYS/PACK) ×4 IMPLANT
TUBE MAZOR SA REDUCTION (TUBING) ×4 IMPLANT
WATER STERILE IRR 1000ML POUR (IV SOLUTION) ×8 IMPLANT

## 2017-10-22 NOTE — Progress Notes (Signed)
Pt wears home CPAP unit.

## 2017-10-22 NOTE — Op Note (Signed)
Date of surgery: 10/22/2017 Preoperative diagnosis: Spondylosis and stenosis L2-3 L3-4 L4-5 with neurogenic claudication, lumbar radiculopathy. Postoperative diagnosis: Same Procedure: Anterolateral decompression and arthrodesis L2-3 L3-4 L4-5 using titanium spacers with allograft and infuse. Neural monitoring. Surgeon: [name] Anesthesia: Gen. endotracheal Indications: [name] is a 77 year old individual who's had a previous spondylitic stenosis in the lumbar spine. He underwent decompression and number of years ago and tolerated this well however he is developed restenosis at the levels of L2-3 L3-4 and L4-5. He has significant subarticular lateral recess stenosis. Is advised regarding the need for surgical decompression but also stabilization and arthrodesis from L2-L5. The procedures being done in 2 stages with first stage being an anterolateral indirect decompression and arthrodesis followed by posterior fixation.  Procedure: The patient was brought to the operating room supine on a stretcher. After the smooth induction of general endotracheal anesthesia, he was placed in the right lateral decubitus position after neural monitoring equipment was placed onto the lower extremity musculature. Fluoroscopic images were obtained in true lateral and AP orientation to uses guiding for this minimally invasive procedure. Then entry sites were chosen on the left lateral aspect by noting the orientation of L4-5 L3-4 and L2-3. This area was then cleansed with alcohol and prepped with DuraPrep and draped in a sterile fashion. A lateral incision was created over the L4-L5 region and a posterior incision was created to dissect into the retroperitoneum. With a finger in the retroperitoneum the lateral incision was then probed and a blunt-tipped probe was passed through the lateral abdominal wall to enter the retroperitoneal space. This was guided into the psoas muscle. Radiographically confirmed position  over the L4-L5 interspace on the lateral aspect. Then a K wire was passed into the disc space. Stimulation was performed in all quadrants to avoid any neural structures. A series of dilators was then passed and a self-retaining retractor was placed over the lateral aspect to expose the lateral aspect of the disc space at L4-L5 a shim was placed into the disc space to help secure the retractor in proper position. Then lateral discectomy was performed by using a 15 blade to open the disc space. A series of curettes and rongeurs was used to evacuate a substantial quantity of severely degenerated and desiccated disc material. Care was taken to decorticate the endplates at G3-O7. Then a series of dilators was used to expand the space and at L4-L5 ultimately a 12 mm tall lordotic spacer measuring 22 mm in anterior posterior dimension and 60 mm in width was able to be placed this allowed for 10 of lordosis in the L4-L5 space. Once the probable was placed was removed and the endplates were decorticated adequately to allow good grafting surface. Distraction of the interspace was obtained with the spacer. With this the spacer was placed with allograft being cortical cancellus chips of bone along with infuse in a titanium spacer of the dimensions noted. Attention was then turned to L3 for worse similar procedure was carried out here the disc space was also markedly degenerated and by gradually dilating and expanding it were able to place a 10 mm tall 10 lordotic 22 x 60 mm spacer into the interspace at L3-4. The same allograft infuse commendation was used. At L to 3 a 12 mm tall spacer with 10 lordosis measuring 22 x 55 mm was placed. Final radiographic confirmation of all 3 spacers was obtained. Then the 2 lateral incisions that were used to access these interspaces were closed with 2-0  Vicryl in the deep tissue and 3-0 Vicryl subcutaneous take a. Dermabond was placed on all through the incisions. Monitoring equipment was  then removed and the patient was prepared for the second portion of the procedure by being placed prone onto the [place] table. Blood loss for this procedure was estimated at 150 mL.

## 2017-10-22 NOTE — Anesthesia Preprocedure Evaluation (Addendum)
Anesthesia Evaluation  Patient identified by MRN, date of birth, ID band Patient awake    Reviewed: Allergy & Precautions, H&P , NPO status , Patient's Chart, lab work & pertinent test results, reviewed documented beta blocker date and time   History of Anesthesia Complications Negative for: history of anesthetic complications  Airway Mallampati: III  TM Distance: >3 FB Neck ROM: Limited    Dental  (+) Partial Upper, Dental Advidsory Given   Pulmonary shortness of breath, sleep apnea and Continuous Positive Airway Pressure Ventilation , former smoker,    breath sounds clear to auscultation       Cardiovascular hypertension, On Medications and On Home Beta Blockers (-) angina+ CAD, + Past MI and + Cardiac Stents   Rhythm:Regular     Neuro/Psych PSYCHIATRIC DISORDERS Depression  Neuromuscular disease    GI/Hepatic hiatal hernia, GERD  Controlled,  Endo/Other  diabetes, Type 2Morbid obesity  Renal/GU CRFRenal disease     Musculoskeletal  (+) Arthritis ,   Abdominal   Peds  Hematology   Anesthesia Other Findings   Reproductive/Obstetrics                            Anesthesia Physical Anesthesia Plan  ASA: III  Anesthesia Plan: General   Post-op Pain Management:    Induction: Intravenous  PONV Risk Score and Plan: 2 and Ondansetron and Dexamethasone  Airway Management Planned: Oral ETT  Additional Equipment: None  Intra-op Plan:   Post-operative Plan: Extubation in OR  Informed Consent: I have reviewed the patients History and Physical, chart, labs and discussed the procedure including the risks, benefits and alternatives for the proposed anesthesia with the patient or authorized representative who has indicated his/her understanding and acceptance.   Dental Advisory Given and Dental advisory given  Plan Discussed with: CRNA and Surgeon  Anesthesia Plan Comments:        Anesthesia Quick Evaluation

## 2017-10-22 NOTE — H&P (Signed)
William Hendrix is an 77 y.o. male.   Chief Complaint: Back pain bilateral lower extremity pain HPI: William Hendrix is a 77 year old individual who's had significant spondylosis of his lower lumbar spine. He is had previous laminotomies and decompression and for appear to time he seemed to improve his function over the past year so he's had deteriorating level of function regards to his back and his lower extremity strength and stamina. He has spondylitic disease at L2-3 3445 with lateral recess stenosis that is recurring. Been advised regarding the need for surgical decompression and stabilization. He has underlying diabetes mellitus. After careful consideration we discussed doing a minimally invasive approach via an anterolateral decompression using an X lift technique and posterior stabilization using a percutaneous technique to decompress and stabilize L2-3 L3-4 and L4-5. Is now admitted for this procedure. He has a history of an abdominal aortic aneurysm that is mild and stable.  Past Medical History:  Diagnosis Date  . AAA (abdominal aortic aneurysm) ([place]) 12/15/2014   a. Mild aneurysmal dilatation of the infrarenal abdominal aorta 3.2 cm - f/u due by 2019.  . Arthritis    "all over" (07/30/2016)  . CAD (coronary artery disease)    a. stent to LAD 2000. b. possible spasm by cath 2001. c. IVUS/PTCA/DES to mLAD 05/2013. d. PTCA/DES of dRCA into ostial rPDA 07/2013. e. PTCA of OM2, DES to Cx, DES to [facility] 07/2016.  Marland Kitchen Chronic bronchitis ([place])   . Chronic diastolic CHF (congestive heart failure) ([place])   . Chronic lower back pain   . Chronic neck pain   . CKD (chronic kidney disease), stage III ([place])   . Diabetic peripheral neuropathy ([place])   . Ejection fraction    55%, 07/2010, mild inferior hypo  . GERD (gastroesophageal reflux disease)   . Gout   . H/O diverticulitis of colon 01/31/2015  . H/O hiatal hernia   . History of blood transfusion ~ 10/1940   "had pneumonia"  . History of kidney  stones   . HTN (hypertension)   . Hyperlipidemia   . Melanoma of forearm, left ([place]) 02/2011   with wide excision   . Myocardial infarction ([place])   . Nephrolithiasis   . Obesity   . OSA on CPAP     [name] since 2000  . Peripheral neuropathy    both feet  . Positive D-dimer    a. significant elevation ,hospital 07/2010, etiology unclear. b. D Dimer chronically > 20.  . Skin cancer   . Spinal stenosis    a. s/p surgical repair 2013  . Spinal stenosis of lumbar region   . Type II diabetes mellitus ([place])   . Ventral hernia     Past Surgical History:  Procedure Laterality Date  . BACK SURGERY    . CARDIAC CATHETERIZATION N/A 07/30/2016   Procedure: Left Heart Cath and Coronary Angiography;  Surgeon: Nelva Bush, MD;  Location: [place] [facility];  Service: Cardiovascular;  Laterality: N/A;  . CARDIAC CATHETERIZATION N/A 07/30/2016   Procedure: Coronary Stent Intervention;  Surgeon: Nelva Bush, MD;  Location: [place] [facility];  Service: Cardiovascular;  Laterality: N/A;  Mid CFX and MID RCA  . CARDIAC CATHETERIZATION N/A 07/30/2016   Procedure: Coronary Balloon Angioplasty;  Surgeon: Nelva Bush, MD;  Location: [place] [facility];  Service: Cardiovascular;  Laterality: N/A;  OM 1  . CARPAL TUNNEL RELEASE Left   . CATARACT EXTRACTION W/ INTRAOCULAR LENS  IMPLANT, BILATERAL Bilateral ~ 2014  .  CERVICAL DISC SURGERY  1990s  . CORONARY ANGIOPLASTY WITH STENT PLACEMENT  05/2013  . CORONARY ANGIOPLASTY WITH STENT PLACEMENT  07/26/2013   DES to RCA extending to PDA    . CORONARY ANGIOPLASTY WITH STENT PLACEMENT  07/30/2016  . CORONARY ANGIOPLASTY WITH STENT PLACEMENT  2000   CAD  . DENTAL SURGERY  04/2016   "got infected; had to dig it out"  . EYE SURGERY    . KNEE ARTHROSCOPY Right 1990's   rt  . LEFT AND RIGHT HEART CATHETERIZATION WITH CORONARY ANGIOGRAM N/A 07/26/2013   Procedure: LEFT AND RIGHT HEART CATHETERIZATION WITH CORONARY ANGIOGRAM;  Surgeon: [name], MD;  Location: [place] [facility] CATH LAB;  Service: Cardiovascular;  Laterality: N/A;  . LEFT HEART CATH AND CORONARY ANGIOGRAPHY N/A 02/26/2017   Procedure: Left Heart Cath and Coronary Angiography;  Surgeon: Sherren Mocha, MD;  Location: [place] [facility];  Service: Cardiovascular;  Laterality: N/A;  . LEFT HEART CATHETERIZATION WITH CORONARY ANGIOGRAM N/A 05/19/2013   Procedure: LEFT HEART CATHETERIZATION WITH CORONARY ANGIOGRAM;  Surgeon: [name], MD;  Location: [facility] C[facility] LAB;  Service: Cardiovascular;  Laterality: N/A;  . LUMBAR LAMINECTOMY/DECOMPRESSION MICRODISCECTOMY  08/30/2012   Procedure: LUMBAR LAMINECTOMY/DECOMPRESSION MICRODISCECTOMY 2 LEVELS;  Surgeon: [name], MD;  Location: [facility] NEURO ORS;  Service: Neurosurgery;  Laterality: Bilateral;  Bilateral Lumbar three-four Lumbar four-five Laminotomies  . LUMBAR SPINE SURGERY  04/07/2016   Dr. Ellene Route; "?ruptured disc"  . MELANOMA EXCISION Left 02/2011   forearm  . NASAL SINUS SURGERY  1970s   "cut windows in sinus pockets"  . PERCUTANEOUS CORONARY STENT INTERVENTION (PCI-S)  05/19/2013   Procedure: PERCUTANEOUS CORONARY STENT INTERVENTION (PCI-S);  Surgeon: [name], MD;  Location: [facility] CATH LAB;  Service: Cardiovascular;;  . ULNAR TUNNEL RELEASE Left 04/07/2016   Dr. Ellene Route    Family History  Problem Relation Age of Onset  . Cancer Mother        intestinal   . Heart disease Mother   . Cancer Father        prostate  . Migraines Daughter   . Leukemia Sister   . Heart disease Sister   . Prostate cancer Unknown   . Kidney cancer Unknown   . Cancer Unknown        Bladder cancer  . Coronary artery disease Unknown   . Hypertension Sister   . Hypertension Brother   . Heart attack Neg Hx   . Stroke Neg Hx    Social History:  reports that he quit smoking about 21 years ago. His smoking use included cigarettes. He has a 90.00 pack-year smoking history. he has never used smokeless tobacco. He reports that he does not drink  alcohol or use drugs.  Allergies:  Allergies  Allergen Reactions  . Levemir [Insulin Detemir] Swelling and Other (See Comments)    Patient had redness and swelling and tenderness at injection site.  . Codeine Other (See Comments)    Makes him "shakey", is OK with hydrocodone GI UPSET & TREMORS  . Lipitor [Atorvastatin] Other (See Comments)    Muscle aches; liver functions. Patient is currently taken  . Novolog Mix [Insulin Aspart Prot & Aspart] Other (See Comments)    Causes skin to swell/itch at injection site  . Other Other (See Comments)    Brilinta caused SOB    Medications Prior to Admission  Medication Sig Dispense Refill  . acetaminophen (TYLENOL) 500 MG tablet Take 1,000 mg by mouth 2 (two) times daily.    Marland Kitchen  allopurinol (ZYLOPRIM) 300 MG tablet TAKE 1 TABLET EVERY DAY (Patient taking differently: Take 300 mg by mouth every day) 90 tablet 1  . aspirin EC 81 MG tablet Take 81 mg by mouth daily.    Marland Kitchen atorvastatin (LIPITOR) 40 MG tablet Take 1 tablet (40 mg total) by mouth daily. 90 tablet 1  . B Complex Vitamins (B COMPLEX-B12 PO) Take 1 tablet by mouth at bedtime.    . chlorpheniramine (CHLOR-TRIMETON) 4 MG tablet Take 4 mg by mouth daily.    . Cholecalciferol (VITAMIN D) 1000 UNITS capsule Take 1,000 Units by mouth at bedtime.     . clopidogrel (PLAVIX) 75 MG tablet Take 1 tablet (75 mg total) by mouth daily. 90 tablet 3  . fenofibrate 160 MG tablet Take 1 tablet (160 mg total) by mouth at bedtime. 90 tablet 1  . fluocinonide cream (LIDEX) 9.15 % Apply 1 application topically daily as needed (for rash).     . furosemide (LASIX) 40 MG tablet TAKE 1 AND 1/2 TABLETS EVERY DAY (Patient taking differently: Take 60 mg by mouth every day) 135 tablet 2  . insulin NPH Human (NOVOLIN N) 100 UNIT/ML injection Inject 45 units in am and 55 in the evening (Patient taking differently: Inject 45-50 Units into the skin See admin instructions. Inject 45 units SQ in the morning and inject 50 units  SQ at bedtime) 30 mL 3  . insulin regular (NOVOLIN R RELION) 100 units/mL injection Inject 0.25-0.3 mLs (25-30 Units total) into the skin 3 (three) times daily before meals. (Patient taking differently: Inject 35 Units into the skin 3 (three) times daily before meals. ) 70 mL 3  . isosorbide mononitrate (IMDUR) 30 MG 24 hr tablet Take 1 tablet (30 mg total) by mouth daily. 90 tablet 3  . metoprolol tartrate (LOPRESSOR) 50 MG tablet Take 1 tablet (50 mg total) by mouth 2 (two) times daily. 180 tablet 1  . nitroGLYCERIN (NITROSTAT) 0.4 MG SL tablet Place 1 tablet (0.4 mg total) under the tongue every 5 (five) minutes as needed for chest pain. 25 tablet 3  . pantoprazole (PROTONIX) 20 MG tablet Take 1 tablet (20 mg total) by mouth daily. 90 tablet 3  . Probiotic Product (PROBIOTIC PO) Take 1 capsule by mouth daily.    . tamsulosin (FLOMAX) 0.4 MG CAPS capsule Take 1 capsule (0.4 mg total) by mouth daily. 90 capsule 1  . albuterol (PROVENTIL HFA;VENTOLIN HFA) 108 (90 Base) MCG/ACT inhaler Inhale 2 puffs into the lungs every 6 (six) hours as needed for wheezing or shortness of breath. 1 Inhaler 0  . BD INSULIN SYRINGE ULTRAFINE 31G X 5/16" 1 ML MISC USE  TO INJECT  INSULIN  FIVE  TIMES  DAILY AS INSTRUCTED (Patient not taking: Reported on 10/14/2017) 450 each 3  . benzonatate (TESSALON) 100 MG capsule Take 1 capsule (100 mg total) by mouth 3 (three) times daily as needed for cough. (Patient not taking: Reported on 10/14/2017) 21 capsule 0  . fluticasone (FLONASE) 50 MCG/ACT nasal spray Place 2 sprays into both nostrils daily. (Patient not taking: Reported on 10/14/2017) 16 g 1  . mupirocin ointment (BACTROBAN) 2 % Apply to area thin film twice daily (Patient not taking: Reported on 10/14/2017) 22 g 0  . tiZANidine (ZANAFLEX) 4 MG capsule Take 1 capsule (4 mg total) by mouth 4 (four) times daily as needed for muscle spasms. Take 1 tablet every 6 hours as needed; not to exceed 3 doses in 24 hours. (Patient not  taking:  Reported on 10/14/2017) 40 capsule 2  . TRUE METRIX BLOOD GLUCOSE TEST test strip USE TO TEST BLOOD SUGAR THREE TIMES DAILY AS DIRECTED (Patient not taking: Reported on 10/14/2017) 300 each 3    Results for orders placed or performed during the hospital encounter of 10/20/17 (from the past 48 hour(s))  Basic metabolic panel     Status: Abnormal   Collection Time: 10/20/17 11:55 AM  Result Value Ref Range   Sodium 139 135 - 145 mmol/L   Potassium 3.7 3.5 - 5.1 mmol/L   Chloride 105 101 - 111 mmol/L   CO2 26 22 - 32 mmol/L   Glucose, Bld 158 (H) 65 - 99 mg/dL   BUN 24 (H) 6 - 20 mg/dL   Creatinine, Ser 1.44 (H) 0.61 - 1.24 mg/dL   Calcium 9.8 8.9 - 10.3 mg/dL   GFR calc non Af Amer 45 (L) >60 mL/min   GFR calc Af Amer 53 (L) >60 mL/min    Comment: (NOTE) The eGFR has been calculated using the CKD EPI equation. This calculation has not been validated in all clinical situations. eGFR's persistently <60 mL/min signify possible Chronic Kidney Disease.    Anion gap 8 5 - 15  CBC     Status: None   Collection Time: 10/20/17 11:55 AM  Result Value Ref Range   WBC 6.7 4.0 - 10.5 K/uL   RBC 4.53 4.22 - 5.81 MIL/uL   Hemoglobin 13.8 13.0 - 17.0 g/dL   HCT 42.3 39.0 - 52.0 %   MCV 93.4 78.0 - 100.0 fL   MCH 30.5 26.0 - 34.0 pg   MCHC 32.6 30.0 - 36.0 g/dL   RDW 14.0 11.5 - 15.5 %   Platelets 183 150 - 400 K/uL  Surgical pcr screen     Status: None   Collection Time: 10/20/17 11:55 AM  Result Value Ref Range   MRSA, PCR NEGATIVE NEGATIVE   Staphylococcus aureus NEGATIVE NEGATIVE    Comment: (NOTE) The Xpert SA Assay (FDA approved for NASAL specimens in patients 2 years of age and older), is one component of a comprehensive surveillance program. It is not intended to diagnose infection nor to guide or monitor treatment.   Type and screen     Status: None   Collection Time: 10/20/17 12:02 PM  Result Value Ref Range   ABO/RH(D) AB POS    Antibody Screen NEG    Sample Expiration  11/03/2017    Extend sample reason NO TRANSFUSIONS OR PREGNANCY IN THE PAST 3 MONTHS   ABO/Rh     Status: None   Collection Time: 10/20/17 12:05 PM  Result Value Ref Range   ABO/RH(D) AB POS   Glucose, capillary     Status: Abnormal   Collection Time: 10/20/17 12:09 PM  Result Value Ref Range   Glucose-Capillary 153 (H) 65 - 99 mg/dL   No results found.  Review of Systems  Eyes: Negative.   Respiratory: Negative.   Cardiovascular: Negative.   Gastrointestinal: Negative.   Genitourinary: Negative.   Musculoskeletal: Positive for back pain.  Skin: Negative.   Neurological: Positive for tingling, sensory change, focal weakness and weakness.  Endo/Heme/Allergies:       Diabetes mellitus type 2  Psychiatric/Behavioral: Negative.     Blood pressure 136/64, pulse 60, temperature (!) 97.5 F (36.4 C), temperature source Oral, resp. rate 18, SpO2 99 %. Physical Exam  Constitutional: He is oriented to person, place, and time. He appears well-developed and well-nourished.  Obese  HENT:  Head: Normocephalic and atraumatic.  Eyes: Conjunctivae and EOM are normal.  Neck: Normal range of motion. Neck supple.  Cardiovascular: Normal rate and regular rhythm.  Respiratory: Effort normal and breath sounds normal.  GI: Soft. Bowel sounds are normal.  Musculoskeletal: Normal range of motion.  Positive straight leg raising at 30 in either lower extremity Patrick's maneuver is negative bilaterally.  Neurological: He is alert and oriented to person, place, and time.  Absent deep tendon reflexes  Skin: Skin is warm and dry.  Psychiatric: He has a normal mood and affect. His behavior is normal. Judgment and thought content normal.     Assessment/Plan Spondylosis and stenosis L2-3 L3-4 L4-5.  Anterolateral decompression arthrodesis using X lift technique L2-3 L3-4 L4-5. Posterior percutaneous fixation from L2-L5.  Earleen Newport, MD 10/22/2017, 7:53 AM

## 2017-10-22 NOTE — Transfer of Care (Signed)
Immediate Anesthesia Transfer of Care Note  Patient: William Hendrix  Procedure(s) Performed: Lumbar two-three Lumbar three-four Lumbar four-five Anteriolateral lumbar interbody fusion with percutaneous pedicle screw fixation and infuse (Spine Lumbar) LUMBAR PERCUTANEOUS PEDICLE SCREW 1 LEVEL (Bilateral Spine Lumbar) APPLICATION OF ROBOTIC ASSISTANCE FOR SPINAL PROCEDURE (Spine Lumbar)  Patient Location: PACU  Anesthesia Type:General  Level of Consciousness: drowsy  Airway & Oxygen Therapy: Patient Spontanous Breathing and Patient connected to face mask oxygen  Post-op Assessment: Report given to RN and Post -op Vital signs reviewed and stable  Post vital signs: Reviewed and stable  Last Vitals:  Vitals:   10/22/17 0551  BP: 136/64  Pulse: 60  Resp: 18  Temp: (!) 36.4 C  SpO2: 99%    Last Pain:  Vitals:   10/22/17 0627  TempSrc:   PainSc: 5       Patients Stated Pain Goal: 3 (12/30/20 4825)  Complications: No apparent anesthesia complications

## 2017-10-22 NOTE — Anesthesia Procedure Notes (Signed)
Procedure Name: Intubation Date/Time: 10/22/2017 8:29 AM Performed by: Colin Benton, CRNA Pre-anesthesia Checklist: Patient identified, Emergency Drugs available, Suction available and Patient being monitored Patient Re-evaluated:Patient Re-evaluated prior to induction Oxygen Delivery Method: Circle system utilized Preoxygenation: Pre-oxygenation with 100% oxygen Induction Type: IV induction Ventilation: Mask ventilation with difficulty and Oral airway inserted - appropriate to patient size Laryngoscope Size: Mac and 3 Grade View: Grade I Tube type: Oral Tube size: 7.5 mm Number of attempts: 1 Airway Equipment and Method: Stylet Placement Confirmation: ETT inserted through vocal cords under direct vision and positive ETCO2 Secured at: 23 cm Tube secured with: Tape Dental Injury: Teeth and Oropharynx as per pre-operative assessment

## 2017-10-22 NOTE — Progress Notes (Signed)
Patient ID: William Hendrix, male   DOB: 15-Apr-1941, 77 y.o.   MRN: [id number] Vital signs are stable Patient complains of right buttock pain posteriorly Incision is clean and dry Motor function is intact Complains of some weakness in right leg Otherwise stable

## 2017-10-22 NOTE — Op Note (Signed)
Date of surgery: 10/22/2017 Preoperative diagnosis: Lumbar spondylosis and stenosis L2-3 L3-4 L4-5. Postoperative diagnosis: Same Procedure: Stabilization of lumbar spine with posterior fixation L2-L5 segmentally with pedicle screws using robotic placement of screws to facilitate accuracy. Surgeon: [name] First assistant: Ashley Jacobs M.D. Anesthesia: Gen. endotracheal Indications: [name] is a 77 year old individual who's been brought to the operating room to undergo a two-stage procedure with the first stage being an anterolateral indirect decompression of L2-3 L3-4 L4-5. Having completed that he is being turned prone onto a Jackson table to undergo posterior stabilization with segmental fixation from L2-L5 with robotically assisted screw placement.  Procedure: The patient had completed the first stages operation was then carefully turned prone onto a [place] table. Bony prominences were appropriately padded and protected. His back was then prepped with alcohol and DuraPrep and draped in a sterile fashion. Then by performing the proper orientation radiographs the [facility] surgical robot was attached to the operating table and attached to the patient's posterior suprailiac spine through a guide pin was placed into the posterior suprailiac spine through a small stab incision using a 15 blade. Once the appropriate orientation radiographs were obtained in AP and lateral projection the preoperatively designed surgical plan for placement of the screws had been verified by me and was then administered by the robotic arm to give guidance to placement of the pedicle screws in L2 L3 L4 and L5 bilaterally. The robotic arm was used for planning the incisions in either paramedian region. The skin was infiltrated with lidocaine with epinephrine and a small vertical incisions were made over the entry sites. On the right side at L2 1 singular incision was created at L3 1 separate incision was created  at L4 and L5 1 singular incision was used. The robot was used then to guide placement of a guide tube through which a 2.5 mm drill hole was placed into the pedicle site on L2 L3 L4 and L5. The dural tube was then filled with a K wire. Once all the K wires were placed into the respective vertebrae then placed individual pedicle screws 6.5 x 45 mm screws were placed in L2 and L3 bilaterally and 7.5 x 50 mm screws were placed in L4 and L5. Once the screws were placed we verified their position was an AP and lateral fluoroscopic image. The robot was then removed. Then by placing towers over the screws we placed a 100 mm precontoured rods between the screw heads and secured this to the pedicle screws in a neutral construct. Once the screws were secured the [facility] were removed and verification radiographs were obtained again in AP and lateral projection demonstrating solid fixation from L2-L5. The wounds were then copiously irrigated with antibiotic irrigating solution hemostasis was established using some pledgets of [place] which were irrigated away. The fascia was then closed with 2-0 Vicryl inverted interrupted fashion and 3-0 Vicryl was used in the subcutaneous take her skin. Blood loss for this portion of procedure was estimated at 150 mL also thus 03 100 mL blood loss for this case was had. Patient tolerated procedure was returned to recovery room in stable condition.

## 2017-10-22 NOTE — Anesthesia Procedure Notes (Signed)
Arterial Line Insertion Start/End1/08/2018 7:20 AM, 10/22/2017 7:30 AM Performed by: Josephine Igo, CRNA, CRNA  Patient location: Pre-op. Preanesthetic checklist: patient identified, IV checked, site marked, risks and benefits discussed, surgical consent, monitors and equipment checked, pre-op evaluation, timeout performed and anesthesia consent Lidocaine 1% used for infiltration Left, radial was placed Catheter size: 20 G Hand hygiene performed  and maximum sterile barriers used  Allen's test indicative of satisfactory collateral circulation Attempts: 2 Procedure performed without using ultrasound guided technique. Following insertion, dressing applied and Biopatch. Post procedure assessment: normal and unchanged  Patient tolerated the procedure well with no immediate complications.

## 2017-10-23 LAB — GLUCOSE, CAPILLARY
GLUCOSE-CAPILLARY: 178 mg/dL — AB (ref 65–99)
GLUCOSE-CAPILLARY: 169 mg/dL — AB (ref 65–99)
GLUCOSE-CAPILLARY: 180 mg/dL — AB (ref 65–99)
Glucose-Capillary: 210 mg/dL — ABNORMAL HIGH (ref 65–99)

## 2017-10-23 LAB — BASIC METABOLIC PANEL
ANION GAP: 9 (ref 5–15)
BUN: 20 mg/dL (ref 6–20)
CALCIUM: 8.6 mg/dL — AB (ref 8.9–10.3)
CO2: 24 mmol/L (ref 22–32)
Chloride: 102 mmol/L (ref 101–111)
Creatinine, Ser: 1.32 mg/dL — ABNORMAL HIGH (ref 0.61–1.24)
GFR calc non Af Amer: 50 mL/min — ABNORMAL LOW (ref >60)
GFR, EST AFRICAN AMERICAN: 58 mL/min — AB (ref >60)
Glucose, Bld: 234 mg/dL — ABNORMAL HIGH (ref 65–99)
POTASSIUM: 4.3 mmol/L (ref 3.5–5.1)
Sodium: 135 mmol/L (ref 135–145)

## 2017-10-23 LAB — CBC
HEMATOCRIT: 32.6 % — AB (ref 39.0–52.0)
HEMOGLOBIN: 10.8 g/dL — AB (ref 13.0–17.0)
MCH: 30.9 pg (ref 26.0–34.0)
MCHC: 33.1 g/dL (ref 30.0–36.0)
MCV: 93.4 fL (ref 78.0–100.0)
Platelets: 150 10*3/uL (ref 150–400)
RBC: 3.49 MIL/uL — AB (ref 4.22–5.81)
RDW: 14 % (ref 11.5–15.5)
WBC: 9 10*3/uL (ref 4.0–10.5)

## 2017-10-23 MED ORDER — ORAL CARE MOUTH RINSE
15.0000 mL | Freq: Two times a day (BID) | OROMUCOSAL | Status: DC
  Administered 2017-10-23 (×2): 15 mL via OROMUCOSAL

## 2017-10-23 NOTE — Evaluation (Signed)
Physical Therapy Evaluation Patient Details Name: William Hendrix MRN: [id number] DOB: April 20, 1941 Today's Date: 10/23/2017   History of Present Illness  patient is a 77 yo male s/p Anterolateral decompression and arthrodesis L2-3 L3-4 L4-5   Clinical Impression  Orders received for PT evaluation. Patient demonstrates deficits in functional mobility as indicated below. Will benefit from continued skilled PT to address deficits and maximize function. Will see as indicated and progress as tolerated.  Educated patient on precautions, session limited by pain at this time.     Follow Up Recommendations No PT follow up;Supervision/Assistance - 24 hour    Equipment Recommendations  Rolling walker with 5" wheels    Recommendations for Other Services       Precautions / Restrictions Precautions Precautions: Back Precaution Booklet Issued: Yes (comment) Precaution Comments: verbally reviewed Required Braces or Orthoses: Spinal Brace Spinal Brace: Lumbar corset      Mobility  Bed Mobility               General bed mobility comments: recieved in chair  Transfers Overall transfer level: Needs assistance Equipment used: Rolling walker (2 wheeled) Transfers: Sit to/from Stand Sit to Stand: Min guard         General transfer comment: min guard for safety, increased time and effort to perform. limited by pain  Ambulation/Gait Ambulation/Gait assistance: Min guard Ambulation Distance (Feet): 20 Feet Assistive device: Rolling walker (2 wheeled) Gait Pattern/deviations: Step-through pattern;Decreased stride length;Drifts right/left;Wide base of support Gait velocity: decreased Gait velocity interpretation: Below normal speed for age/gender General Gait Details: patient extremely limited by pain with activity, noted LE weakness and increased anxiety with movement  Stairs            Wheelchair Mobility    Modified Rankin (Stroke Patients Only)       Balance Overall  balance assessment: Needs assistance   Sitting balance-Leahy Scale: Fair Sitting balance - Comments: able to sit without assist     Standing balance-Leahy Scale: Poor Standing balance comment: min guard for static standing during functional tasks at sink, UE support required                              Pertinent Vitals/Pain Pain Assessment: Faces Pain Location: 8 Pain Descriptors / Indicators: Grimacing;Guarding;Sore Pain Intervention(s): Limited activity within patient's tolerance;Monitored during session;Repositioned    Home Living Family/patient expects to be discharged to:: Private residence Living Arrangements: Spouse/significant other Available Help at Discharge: Family Type of Home: House Home Access: Stairs to enter Entrance Stairs-Rails: None Entrance Stairs-Number of Steps: 1 Home Layout: One level Home Equipment: Environmental consultant - 2 wheels;Cane - single point;Shower seat - built in;Grab bars - toilet;Grab bars - tub/shower;Hand held shower head      Prior Function Level of Independence: Independent               Hand Dominance   Dominant Hand: Right    Extremity/Trunk Assessment   Upper Extremity Assessment Upper Extremity Assessment: Overall WFL for tasks assessed    Lower Extremity Assessment Lower Extremity Assessment: Generalized weakness    Cervical / Trunk Assessment Cervical / Trunk Assessment: (s/p spinal surgery)  Communication   Communication: No difficulties  Cognition Arousal/Alertness: Awake/alert Behavior During Therapy: Anxious Overall Cognitive Status: Within Functional Limits for tasks assessed  General Comments      Exercises     Assessment/Plan    PT Assessment Patient needs continued PT services  PT Problem List Decreased strength;Decreased activity tolerance;Decreased balance;Decreased mobility;Decreased knowledge of precautions;Pain       PT Treatment  Interventions DME instruction;Gait training;Stair training;Functional mobility training;Therapeutic activities;Therapeutic exercise;Balance training;Patient/family education    PT Goals (Current goals can be found in the Care Plan section)  Acute Rehab PT Goals Patient Stated Goal: to feel better PT Goal Formulation: With patient Time For Goal Achievement: 11/06/17 Potential to Achieve Goals: Good    Frequency Min 5X/week   Barriers to discharge Decreased caregiver support      Co-evaluation               AM-PAC PT "6 Clicks" Daily Activity  Outcome Measure Difficulty turning over in bed (including adjusting bedclothes, sheets and blankets)?: A Little Difficulty moving from lying on back to sitting on the side of the bed? : A Lot Difficulty sitting down on and standing up from a chair with arms (e.g., wheelchair, bedside commode, etc,.)?: A Lot Help needed moving to and from a bed to chair (including a wheelchair)?: A Little Help needed walking in hospital room?: A Little Help needed climbing 3-5 steps with a railing? : A Lot 6 Click Score: 15    End of Session Equipment Utilized During Treatment: Gait belt Activity Tolerance: Patient limited by pain Patient left: in chair;with call bell/phone within reach Nurse Communication: Mobility status      Time: 1696-7893 PT Time Calculation (min) (ACUTE ONLY): 16 min   Charges:   PT Evaluation $PT Eval Moderate Complexity: 1 Mod     PT G Codes:        Alben Deeds, PT DPT  Board Certified Neurologic Specialist [place] 10/23/2017, 9:49 AM

## 2017-10-23 NOTE — Progress Notes (Signed)
1900: Handoff report received from RN. Pt with poor pain control despite frequent medication. While assessing the pt for ways to manage pain, pt suggests that he needs something to eat. Tray ordered.  2000: Dr. Ellene Route rounding. Verbal order for dilaudid placed in hopes that it will be more effective than morphine. Asked pt about codeine allergy in chart. States that it made him "feel funny" but denies s/s of true allergy. Pt had a dose of dilaudid today in PACU without event. Pt in better spirits after eating, but still with poor pain control.  2100: Pt refusing POD 0 ambulation. Will try again in the morning. Pt wears home CPAP. [facility] on call provider paged. Order placed. Respiratory paged to help pt with setup.  2200: Pt continues with poor pain control despite po and IV medication interventions, repositioning, and verbal encouragement.  0000: Pt continues with poor pain control. PRN medication administered and pt repositioned. Encouraged pt to try and sleep as there will now be some time without interruption.  0200: Pt getting intermittent rest, but awakens easily and c/o pain unrelenting pain.  0400: Pt sleeping.  0600: Foley d/c'ed. Pt got up to ambulate around the unit with a front wheel walker. Pt expressed some pain relief getting oob, walked across the hall and returned to the recliner.  0700: Handoff report given to RN.

## 2017-10-23 NOTE — Progress Notes (Signed)
1900: Handoff report received from RN. Pt resting in recliner with moderate pain. Will medicate. Expressing desire to ambulate once more before bed tonight.  0000: Pt resting comfortably.  0400: Pt continues resting comfortably. Pt spilled urinal in bed and had to get up for linen change. Pt reports pain is likely acute r/t the transfer from bed to chair.  0600: Pt up to the bathroom with the urge to have a bowel movement but unable to do so. PRN laxative administered. IV bruised and painful.  0700: Handoff report given to RN. No acute events overnight.

## 2017-10-23 NOTE — Progress Notes (Signed)
Vitals:   10/22/17 2155 10/23/17 0000 10/23/17 0416 10/23/17 0757  BP: (!) 139/58 (!) 133/54 (!) 126/54 (!) 135/58  Pulse: (!) 103 93 77 68  Resp:  11 12 (!) 100  Temp:  100.3 F (37.9 C) 99.6 F (37.6 C) 98.3 F (36.8 C)  TempSrc:  Oral Oral Oral  SpO2:  96% 97%     CBC Recent Labs    10/20/17 1155 10/22/17 0940 10/23/17 0424  WBC 6.7  --  9.0  HGB 13.8 12.2* 10.8*  HCT 42.3 36.0* 32.6*  PLT 183  --  150   BMET Recent Labs    10/20/17 1155 10/22/17 0940 10/23/17 0424  NA 139 143 135  K 3.7 4.0 4.3  CL 105  --  102  CO2 26  --  24  GLUCOSE 158* 103* 234*  BUN 24*  --  20  CREATININE 1.44*  --  1.32*  CALCIUM 9.8  --  8.6*    Patient sitting up, eating breakfast. Moderate incisional and some radicular discomfort. Dressing clean and dry. Has ambulated once in the halls, awaiting PT and OT.  Plan: Stable following surgery. Encouraged to ambulate. Continue to progress through postoperative recovery.  Hosie Spangle, MD 10/23/2017, 9:12 AM

## 2017-10-23 NOTE — Anesthesia Postprocedure Evaluation (Signed)
Anesthesia Post Note  Patient: RHODES CALVERT  Procedure(s) Performed: Lumbar two-three Lumbar three-four Lumbar four-five Anteriolateral lumbar interbody fusion with percutaneous pedicle screw fixation and infuse (Spine Lumbar) LUMBAR PERCUTANEOUS PEDICLE SCREW 1 LEVEL (Bilateral Spine Lumbar) APPLICATION OF ROBOTIC ASSISTANCE FOR SPINAL PROCEDURE (Spine Lumbar)     Patient location during evaluation: PACU Anesthesia Type: General Level of consciousness: awake and alert Pain management: pain level controlled Vital Signs Assessment: post-procedure vital signs reviewed and stable Respiratory status: spontaneous breathing, nonlabored ventilation, respiratory function stable and patient connected to nasal cannula oxygen Cardiovascular status: blood pressure returned to baseline and stable Postop Assessment: no apparent nausea or vomiting Anesthetic complications: no    Last Vitals:  Vitals:   10/23/17 0416 10/23/17 0757  BP: (!) 126/54 (!) 135/58  Pulse: 77 68  Resp: 12 (!) 100  Temp: 37.6 C 36.8 C  SpO2: 97%     Last Pain:  Vitals:   10/23/17 0757  TempSrc: Oral  PainSc:                  Elad Macphail

## 2017-10-23 NOTE — Progress Notes (Signed)
CSW met with patient at bedside to discuss discharge needs. Patient came from home where he lives with his wife of 78 years. Patient stated that he is doing well considering the recent surgery and that he did not desire to go to SNF at discharge that he wants to go home. Patient stated that he has been educated on what to do and not do whenever he goes home regarding picking up heavy items, etc. Patient stated that he asked PT to keep him at the hospital for further therapy until he is stable to go home. CSW informed patient that if any changes occur and he would like to go to a facility for further rehab to please reach out to staff, stated understanding.  CSW signing off.  Madilyn Fireman, MSW, LCSW-A Weekend Clinical Social Worker [phone]

## 2017-10-23 NOTE — Evaluation (Signed)
Occupational Therapy Evaluation Patient Details Name: William Hendrix MRN: [id number] DOB: 1941/03/01 Today's Date: 10/23/2017    History of Present Illness patient is a 77 yo male s/p Anterolateral decompression and arthrodesis L2-3 L3-4 L4-5    Clinical Impression   PTA Pt modified independent in ADL and mobility. Pt is currently max A for LB ADL and min guard for standing grooming tasks. Back handout provided and reviewed adls in detail. Pt educated on: clothing between brace, never sleep in brace, set an alarm at night for medication, avoid sitting for long periods of time, correct bed positioning for sleeping, correct sequence for bed mobility, AE for LB ADL (Pt has reacher already at home) avoiding lifting more than 5 pounds and never wash directly over incision. Pt will benefit from skilled OT in the acute setting to maximize safety and independence in ADL and functional transfer prior to dc home with 24 hour supervision from family. Next session to focus on AE education and transfers.      Follow Up Recommendations  Supervision/Assistance - 24 hour    Equipment Recommendations  3 in 1 bedside commode    Recommendations for Other Services       Precautions / Restrictions Precautions Precautions: Back Precaution Booklet Issued: Yes (comment) Precaution Comments: verbally reviewed Required Braces or Orthoses: Spinal Brace Spinal Brace: Lumbar corset Restrictions Weight Bearing Restrictions: No      Mobility Bed Mobility Overal bed mobility: Needs Assistance Bed Mobility: Sit to Sidelying;Rolling Rolling: Supervision       Sit to sidelying: Min assist General bed mobility comments: assist for BLE into bed, educated on technique to maintain back precautions  Transfers Overall transfer level: Needs assistance Equipment used: Rolling walker (2 wheeled) Transfers: Sit to/from Stand Sit to Stand: Min guard         General transfer comment: min guard for safety,  increased time and effort to perform. limited by pain    Balance Overall balance assessment: Needs assistance   Sitting balance-Leahy Scale: Fair Sitting balance - Comments: able to sit without assist     Standing balance-Leahy Scale: Poor Standing balance comment: min guard for static standing during functional tasks at sink, UE support required                            ADL either performed or assessed with clinical judgement   ADL Overall ADL's : Needs assistance/impaired Eating/Feeding: Modified independent;Sitting   Grooming: Min guard;Standing Grooming Details (indicate cue type and reason): educated in compensatory methods for standing grooming Upper Body Bathing: Moderate assistance   Lower Body Bathing: With adaptive equipment;Min guard;With caregiver independent assisting;Sit to/from stand Lower Body Bathing Details (indicate cue type and reason): wife can clean back, also educated in long handle sponge Upper Body Dressing : Minimal assistance;Sitting Upper Body Dressing Details (indicate cue type and reason): to don brace Lower Body Dressing: Maximal assistance;Sit to/from stand Lower Body Dressing Details (indicate cue type and reason): initiated AE for LB dressing Toilet Transfer: Min guard Toilet Transfer Details (indicate cue type and reason): increased time and effort required - significant pain Toileting- Clothing Manipulation and Hygiene: Maximal assistance Toileting - Clothing Manipulation Details (indicate cue type and reason): reports that his wife can assist Tub/ Shower Transfer: Walk-in shower;Min guard;Rolling walker;Ambulation;Shower seat   Functional mobility during ADLs: Min guard;Rolling walker General ADL Comments: Reviewed compensatory strategies for ADL and verbally initiated AE education. Pt has grabber/reacher and feels confident  using it and plans to use slippers at home.     Vision Patient Visual Report: No change from  baseline Vision Assessment?: No apparent visual deficits     Perception     Praxis      Pertinent Vitals/Pain Pain Assessment: Faces Pain Location: 8 Pain Descriptors / Indicators: Grimacing;Guarding;Sore Pain Intervention(s): Monitored during session;Repositioned     Hand Dominance Right   Extremity/Trunk Assessment Upper Extremity Assessment Upper Extremity Assessment: Overall WFL for tasks assessed   Lower Extremity Assessment Lower Extremity Assessment: Defer to PT evaluation   Cervical / Trunk Assessment Cervical / Trunk Assessment: (s/p spinal surgery)   Communication Communication Communication: No difficulties   Cognition Arousal/Alertness: Awake/alert Behavior During Therapy: Anxious Overall Cognitive Status: Within Functional Limits for tasks assessed                                     General Comments  wife at home can help some - but has back problems herself and is limited in the amount of assist that she can provide    Exercises     Shoulder Instructions      Home Living Family/patient expects to be discharged to:: Private residence Living Arrangements: Spouse/significant other Available Help at Discharge: Family Type of Home: House Home Access: Stairs to enter Technical brewer of Steps: 1 Entrance Stairs-Rails: None Home Layout: One level     Bathroom Shower/Tub: Occupational psychologist: Handicapped height Bathroom Accessibility: Yes   Home Equipment: Environmental consultant - 2 wheels;Cane - single point;Shower seat - built in;Grab bars - toilet;Grab bars - tub/shower;Hand held shower head;Adaptive equipment Adaptive Equipment: Reacher;Long-handled sponge        Prior Functioning/Environment Level of Independence: Independent                 OT Problem List: Decreased range of motion;Decreased activity tolerance;Impaired balance (sitting and/or standing);Decreased safety awareness;Decreased knowledge of use of DME  or AE;Decreased knowledge of precautions;Obesity;Pain      OT Treatment/Interventions: Self-care/ADL training;Energy conservation;DME and/or AE instruction;Therapeutic activities;Patient/family education;Balance training    OT Goals(Current goals can be found in the care plan section) Acute Rehab OT Goals Patient Stated Goal: to feel better OT Goal Formulation: With patient Time For Goal Achievement: 11/06/17 Potential to Achieve Goals: Good ADL Goals Pt Will Perform Grooming: with modified independence;standing Pt Will Perform Lower Body Bathing: with set-up;with adaptive equipment;sit to/from stand Pt Will Perform Upper Body Dressing: with set-up;sitting Pt Will Perform Lower Body Dressing: with min assist;with caregiver independent in assisting;with adaptive equipment;sit to/from stand Pt Will Transfer to Toilet: with modified independence;ambulating;regular height toilet Pt Will Perform Toileting - Clothing Manipulation and hygiene: with caregiver independent in assisting;with adaptive equipment;with min guard assist;sit to/from stand Additional ADL Goal #1: Pt will recall 3/3 back precautions with no verbal cues prior to participation in ADL activity  OT Frequency: Min 2X/week   Barriers to D/C:            Co-evaluation              AM-PAC PT "6 Clicks" Daily Activity     Outcome Measure Help from another person eating meals?: None Help from another person taking care of personal grooming?: A Little Help from another person toileting, which includes using toliet, bedpan, or urinal?: A Lot Help from another person bathing (including washing, rinsing, drying)?: A Lot Help from another person to put  on and taking off regular upper body clothing?: A Little Help from another person to put on and taking off regular lower body clothing?: A Lot 6 Click Score: 16   End of Session Equipment Utilized During Treatment: Back brace;Rolling walker Nurse Communication: Mobility  status  Activity Tolerance: Patient limited by pain Patient left: in bed;with call bell/phone within reach;with SCD's reapplied  OT Visit Diagnosis: Unsteadiness on feet (R26.81);Other abnormalities of gait and mobility (R26.89);Pain Pain - part of body: (back)                Time: 5537-4827 OT Time Calculation (min): 28 min Charges:  OT General Charges $OT Visit: 1 Visit OT Evaluation $OT Eval Moderate Complexity: 1 Mod OT Treatments $Self Care/Home Management : 8-22 mins G-Codes:     Hulda Humphrey OTR/L [place] 10/23/2017, 11:43 AM

## 2017-10-24 LAB — GLUCOSE, CAPILLARY
GLUCOSE-CAPILLARY: 141 mg/dL — AB (ref 65–99)
GLUCOSE-CAPILLARY: 193 mg/dL — AB (ref 65–99)
GLUCOSE-CAPILLARY: 184 mg/dL — AB (ref 65–99)

## 2017-10-24 NOTE — Progress Notes (Signed)
Orthopedic Tech Progress Note Patient Details:  William Hendrix May 05, 1941 [id number]  Patient ID: William Hendrix, male   DOB: [birth year], 77 y.o.   MRN: [id number]   [name] 10/24/2017, 8:33 AMCalled [employer] for Lumbar brace.

## 2017-10-24 NOTE — Progress Notes (Signed)
Neurosurgery Progress Note  No issues overnight. Pain fairly manageable Would like to stay one more day  EXAM:  BP (!) 146/67 (BP Location: Left Arm)   Pulse 86   Temp 99.2 F (37.3 C) (Oral)   Resp 18   SpO2 97%   Awake, alert, oriented  Speech fluent, appropriate  CN grossly intact  MAEW with good strength Wound c/d/i  PLAN Stable this am Continue post op recovery Hopeful for d/c tomorrow

## 2017-10-24 NOTE — Progress Notes (Signed)
Physical Therapy Treatment Patient Details Name: William Hendrix MRN: [id number] DOB: 06-25-1941 Today's Date: 10/24/2017    History of Present Illness patient is a 77 yo male s/p Anterolateral decompression and arthrodesis L2-3 L3-4 L4-5     PT Comments    Pt very pleasant reports limitation by pain despite premedication remains at 7-8/10 with activity and with sitting. Pt educated for all precaution as only able to recall 2/3. Pt able to don and adjust brace without assist. Pt encouraged to walk 3x/day as well as continue sitting less than 30 min at a time with handout reinforced. Will continue to follow.     Follow Up Recommendations  No PT follow up;Supervision/Assistance - 24 hour     Equipment Recommendations  None recommended by PT    Recommendations for Other Services       Precautions / Restrictions Precautions Precautions: Back Required Braces or Orthoses: Spinal Brace Spinal Brace: Lumbar corset;Applied in sitting position    Mobility  Bed Mobility               General bed mobility comments: in chair on arrival  Transfers Overall transfer level: Needs assistance   Transfers: Sit to/from Stand Sit to Stand: Supervision         General transfer comment: cues for posture and hand placement  Ambulation/Gait Ambulation/Gait assistance: Supervision Ambulation Distance (Feet): 175 Feet Assistive device: Rolling walker (2 wheeled) Gait Pattern/deviations: Step-through pattern;Decreased stride length;Wide base of support   Gait velocity interpretation: Below normal speed for age/gender General Gait Details: cues for position in RW, decreased speed with pt noting right groin pain limiting tolerance   Stairs            Wheelchair Mobility    Modified Rankin (Stroke Patients Only)       Balance Overall balance assessment: No apparent balance deficits (not formally assessed)                                           Cognition Arousal/Alertness: Awake/alert Behavior During Therapy: WFL for tasks assessed/performed Overall Cognitive Status: Within Functional Limits for tasks assessed                                        Exercises      General Comments        Pertinent Vitals/Pain Pain Location: 8 Pain Descriptors / Indicators: Grimacing;Guarding;Sore Pain Intervention(s): Limited activity within patient's tolerance;Repositioned;Premedicated before session    Home Living                      Prior Function            PT Goals (current goals can now be found in the care plan section) Progress towards PT goals: Progressing toward goals    Frequency    Min 5X/week      PT Plan Current plan remains appropriate    Co-evaluation              AM-PAC PT "6 Clicks" Daily Activity  Outcome Measure  Difficulty turning over in bed (including adjusting bedclothes, sheets and blankets)?: A Little Difficulty moving from lying on back to sitting on the side of the bed? : A Little Difficulty sitting down on and standing up from  a chair with arms (e.g., wheelchair, bedside commode, etc,.)?: A Little Help needed moving to and from a bed to chair (including a wheelchair)?: A Little Help needed walking in hospital room?: A Little Help needed climbing 3-5 steps with a railing? : A Little 6 Click Score: 18    End of Session Equipment Utilized During Treatment: Gait belt;Back brace Activity Tolerance: Patient tolerated treatment well Patient left: in chair;with call bell/phone within reach Nurse Communication: Mobility status;Precautions PT Visit Diagnosis: Other abnormalities of gait and mobility (R26.89)     Time: 1324-4010 PT Time Calculation (min) (ACUTE ONLY): 19 min  Charges:  $Gait Training: 8-22 mins                    G Codes:       Elwyn Reach, PT [phone]    [place] 10/24/2017, 8:24 AM

## 2017-10-24 NOTE — Progress Notes (Signed)
Inpatient Diabetes Program Recommendations  AACE/ADA: New Consensus Statement on Inpatient Glycemic Control (2015)  Target Ranges:  Prepandial:   less than 140 mg/dL      Peak postprandial:   less than 180 mg/dL (1-2 hours)      Critically ill patients:  140 - 180 mg/dL   Lab Results  Component Value Date   GLUCAP 193 (H) 10/24/2017   HGBA1C 6.3 09/09/2017    Review of Glycemic Control  Diabetes history: DM2 Outpatient Diabetes medications: Novolin N 45 in am, 50 units QHS, Novolin R 35 units tidwc Current orders for Inpatient glycemic control: NPH 45 in am, 55 QHS,   Good glycemic control after surgery.  Inpatient Diabetes Program Recommendations:     Add Novolog 0-15 units tidwc and hs  Continue to follow.  Thank you. Lorenda Peck, RD, LDN, CDE Inpatient Diabetes Coordinator [phone]

## 2017-10-24 NOTE — Care Management Note (Signed)
Case Management Note  Patient Details  Name: JONH MCQUEARY MRN: [id number] Date of Birth: 1941-02-04  Subjective/Objective:       Pt presented for surgical intervention of lumbar spine.  Pt from home with wife.  Pt independent PTA. Pt states he has handicapped accessible bathrooms and necessary DME including RW.  Pt states that wife is there 24/7, but limited in the amount she can help him due to her back problems.  Pt has son who can help as needed.     Pt briefly discussed going to a facility to relieve the stress on his wife, but understands that he would not qualify for a SNF because of his independence with ambulation.         Action/Plan: Offered pt RW and 3in1.  Pt states he has a RW and 3in1 not necessary.    CM will continue to follow for d/c needs.   Expected Discharge Date:   10/25/17             Expected Discharge Plan:  Home/Self Care  In-House Referral:  NA  Discharge planning Services  CM Consult  Post Acute Care Choice:  Durable Medical Equipment Choice offered to:  Patient  DME Arranged:  N/A(Pt declined RW and 3N1) DME Agency:  NA  HH Arranged:  NA HH Agency:  NA  Status of Service:  Completed, signed off  If discussed at [place] of Stay Meetings, dates discussed:    Additional Comments:  Arley Phenix, RN 10/24/2017, 3:33 PM

## 2017-10-25 ENCOUNTER — Encounter (HOSPITAL_COMMUNITY): Payer: Self-pay | Admitting: Neurological Surgery

## 2017-10-25 LAB — URINALYSIS, ROUTINE W REFLEX MICROSCOPIC
Bilirubin Urine: NEGATIVE
Glucose, UA: 50 mg/dL — AB
Hgb urine dipstick: NEGATIVE
KETONES UR: NEGATIVE mg/dL
LEUKOCYTES UA: NEGATIVE
NITRITE: NEGATIVE
PH: 5 (ref 5.0–8.0)
PROTEIN: NEGATIVE mg/dL
Specific Gravity, Urine: 1.009 (ref 1.005–1.030)

## 2017-10-25 LAB — GLUCOSE, CAPILLARY
GLUCOSE-CAPILLARY: 121 mg/dL — AB (ref 65–99)
Glucose-Capillary: 188 mg/dL — ABNORMAL HIGH (ref 65–99)
Glucose-Capillary: 248 mg/dL — ABNORMAL HIGH (ref 65–99)
Glucose-Capillary: 172 mg/dL — ABNORMAL HIGH (ref 65–99)

## 2017-10-25 MED ORDER — METHOCARBAMOL 500 MG PO TABS: 500.0000 mg | ORAL_TABLET | Freq: Four times a day (QID) | ORAL | 3 refills | Status: DC | PRN

## 2017-10-25 MED ORDER — HYDROCODONE-ACETAMINOPHEN 5-325 MG PO TABS: 1.0000 | ORAL_TABLET | ORAL | 0 refills | Status: DC | PRN

## 2017-10-25 MED FILL — Heparin Sodium (Porcine) Inj 1000 Unit/ML: INTRAMUSCULAR | Qty: 30 | Status: AC

## 2017-10-25 MED FILL — Sodium Chloride IV Soln 0.9%: INTRAVENOUS | Qty: 1000 | Status: AC

## 2017-10-25 NOTE — Progress Notes (Signed)
1900: Upon arriving to the unit, pt son asking about pt going home. During report, Dr. Ellene Route on the unit, ok to dc pt. IV removed by another Therapist, sports. I went over the discharge papers with pt and his son, and provided signed paper prescription. Pt in no distress, able to dress self and ambulate about the room. Pt confirmed that he had all of his belongings. Unit secretary escorted pt via wheelchair and his son to [facility].

## 2017-10-25 NOTE — Progress Notes (Signed)
Physical Therapy Treatment Patient Details Name: William Hendrix MRN: [id number] DOB: Nov 24, 1940 Today's Date: 10/25/2017    History of Present Illness patient is a 77 yo male s/p Anterolateral decompression and arthrodesis L2-3 L3-4 L4-5     PT Comments    Pt pleasant and reports increased sleep last night with decreased groin pain today. Pt able to perform bed mobility without assist but could not recall precautions beginning of session. Pt able to don brace without assit and ambulate. Encouraged increased activity throughout the day. Will continue to follow.     Follow Up Recommendations  No PT follow up;Supervision/Assistance - 24 hour     Equipment Recommendations  None recommended by PT    Recommendations for Other Services       Precautions / Restrictions Precautions Precautions: Back Precaution Comments: pt unable to recall at beginning of session but stated all end of session Required Braces or Orthoses: Spinal Brace Spinal Brace: Lumbar corset;Applied in sitting position    Mobility  Bed Mobility Overal bed mobility: Modified Independent             General bed mobility comments: pt able to roll and perform side to sit without assist  Transfers Overall transfer level: Modified independent               General transfer comment: increased time  Ambulation/Gait Ambulation/Gait assistance: Supervision Ambulation Distance (Feet): 175 Feet Assistive device: Rolling walker (2 wheeled) Gait Pattern/deviations: Step-through pattern;Decreased stride length;Wide base of support   Gait velocity interpretation: Below normal speed for age/gender General Gait Details: cues for position in RW   Stairs            Wheelchair Mobility    Modified Rankin (Stroke Patients Only)       Balance                                            Cognition Arousal/Alertness: Awake/alert Behavior During Therapy: WFL for tasks  assessed/performed Overall Cognitive Status: Within Functional Limits for tasks assessed                                        Exercises      General Comments        Pertinent Vitals/Pain Pain Assessment: 0-10 Pain Score: 5  Pain Location: back Pain Descriptors / Indicators: Grimacing;Guarding;Sore Pain Intervention(s): Limited activity within patient's tolerance;Repositioned    Home Living                      Prior Function            PT Goals (current goals can now be found in the care plan section) Progress towards PT goals: Progressing toward goals    Frequency    Min 5X/week      PT Plan Current plan remains appropriate    Co-evaluation              AM-PAC PT "6 Clicks" Daily Activity  Outcome Measure  Difficulty turning over in bed (including adjusting bedclothes, sheets and blankets)?: A Little Difficulty moving from lying on back to sitting on the side of the bed? : A Little Difficulty sitting down on and standing up from a chair with arms (e.g., wheelchair, bedside commode,  etc,.)?: A Little Help needed moving to and from a bed to chair (including a wheelchair)?: A Little Help needed walking in hospital room?: A Little Help needed climbing 3-5 steps with a railing? : A Little 6 Click Score: 18    End of Session Equipment Utilized During Treatment: Back brace Activity Tolerance: Patient tolerated treatment well Patient left: in chair;with call bell/phone within reach Nurse Communication: Mobility status;Precautions PT Visit Diagnosis: Other abnormalities of gait and mobility (R26.89)     Time: 3734-2876 PT Time Calculation (min) (ACUTE ONLY): 19 min  Charges:  $Gait Training: 8-22 mins                    G Codes:       Elwyn Reach, PT [phone]    [place] 10/25/2017, 9:39 AM

## 2017-10-25 NOTE — Discharge Summary (Signed)
Physician Discharge Summary  Patient ID: William Hendrix MRN: [id number] DOB/AGE: 02-08-1941 77 y.o.  Admit date: 10/22/2017 Discharge date: 10/25/2017  Admission Diagnoses: Spondylosis and stenosis with radiculopathy L2-3 L3-4 L4-5, diabetes mellitus. Morbid obesity.  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 Morbid obesity. Active Problems:   Lumbar stenosis with neurogenic claudication   Discharged Condition: fair  Hospital Course: Patient was admitted to undergo surgical decompression at L2-3 L3-4 and L4-5 using a minimally invasive technique. He underwent posterior stabilization also. He tolerated surgery well. He is ambulatory.  Consults: None  Significant Diagnostic Studies: None  Treatments: surgery: Decompression L2-3 L3-4 L4-5 with anterolateral technique. Posterior stabilization with segmental pedicle screws L2-L5  Discharge Exam: Blood pressure 112/60, pulse 64, temperature 97.7 F (36.5 C), temperature source Oral, resp. rate 14, SpO2 93 %. Incision is clean and dry motor function is intact.  Disposition: 01-Home or Self Care  Discharge Instructions    Call MD for:  redness, tenderness, or signs of infection (pain, swelling, redness, odor or green/yellow discharge around incision site)   Complete by:  As directed    Call MD for:  severe uncontrolled pain   Complete by:  As directed    Call MD for:  temperature >100.4   Complete by:  As directed    Diet - low sodium heart healthy   Complete by:  As directed    Discharge instructions   Complete by:  As directed    Okay to shower. Do not apply salves or appointments to incision. No heavy lifting with the upper extremities greater than 15 pounds. May resume driving when not requiring pain medication and patient feels comfortable with doing so.   Incentive spirometry RT   Complete by:  As directed    Increase activity slowly   Complete by:  As directed       Allergies as of 10/25/2017      Reactions   Levemir [insulin Detemir] Swelling, Other (See Comments)   Patient had redness and swelling and tenderness at injection site.   Codeine Other (See Comments)   Makes him "shakey", is OK with hydrocodone GI UPSET & TREMORS   Lipitor [atorvastatin] Other (See Comments)   Muscle aches; liver functions. Patient is currently taken   Novolog Mix [insulin Aspart Prot & Aspart] Other (See Comments)   Causes skin to swell/itch at injection site   Other Other (See Comments)   Brilinta caused SOB      Medication List    TAKE these medications   acetaminophen 500 MG tablet Commonly known as:  TYLENOL Take 1,000 mg by mouth 2 (two) times daily.   albuterol 108 (90 Base) MCG/ACT inhaler Commonly known as:  PROVENTIL HFA;VENTOLIN HFA Inhale 2 puffs into the lungs every 6 (six) hours as needed for wheezing or shortness of breath.   allopurinol 300 MG tablet Commonly known as:  ZYLOPRIM TAKE 1 TABLET EVERY DAY What changed:    how much to take  how to take this  when to take this   aspirin EC 81 MG tablet Take 81 mg by mouth daily.   atorvastatin 40 MG tablet Commonly known as:  LIPITOR Take 1 tablet (40 mg total) by mouth daily.   B COMPLEX-B12 PO Take 1 tablet by mouth at bedtime.   BD INSULIN SYRINGE ULTRAFINE 31G X 5/16" 1 ML Misc Generic drug:  Insulin Syringe-Needle U-100 USE  TO INJECT  INSULIN  FIVE  TIMES  DAILY  AS INSTRUCTED   benzonatate 100 MG capsule Commonly known as:  TESSALON Take 1 capsule (100 mg total) by mouth 3 (three) times daily as needed for cough.   chlorpheniramine 4 MG tablet Commonly known as:  CHLOR-TRIMETON Take 4 mg by mouth daily.   clopidogrel 75 MG tablet Commonly known as:  PLAVIX Take 1 tablet (75 mg total) by mouth daily.   fenofibrate 160 MG tablet Take 1 tablet (160 mg total) by mouth at bedtime.   fluocinonide cream 0.05 % Commonly known as:  LIDEX Apply 1 application topically  daily as needed (for rash).   fluticasone 50 MCG/ACT nasal spray Commonly known as:  FLONASE Place 2 sprays into both nostrils daily.   furosemide 40 MG tablet Commonly known as:  LASIX TAKE 1 AND 1/2 TABLETS EVERY DAY What changed:  See the new instructions.   HYDROcodone-acetaminophen 5-325 MG tablet Commonly known as:  NORCO/VICODIN Take 1-2 tablets by mouth every 4 (four) hours as needed for severe pain ((score 7 to 10)).   insulin NPH Human 100 UNIT/ML injection Commonly known as:  NOVOLIN N Inject 45 units in am and 55 in the evening What changed:    how much to take  how to take this  when to take this  additional instructions   insulin regular 100 units/mL injection Commonly known as:  NOVOLIN R RELION Inject 0.25-0.3 mLs (25-30 Units total) into the skin 3 (three) times daily before meals. What changed:  how much to take   isosorbide mononitrate 30 MG 24 hr tablet Commonly known as:  IMDUR Take 1 tablet (30 mg total) by mouth daily.   methocarbamol 500 MG tablet Commonly known as:  ROBAXIN Take 1 tablet (500 mg total) by mouth every 6 (six) hours as needed for muscle spasms.   metoprolol tartrate 50 MG tablet Commonly known as:  LOPRESSOR Take 1 tablet (50 mg total) by mouth 2 (two) times daily.   mupirocin ointment 2 % Commonly known as:  BACTROBAN Apply to area thin film twice daily   nitroGLYCERIN 0.4 MG SL tablet Commonly known as:  NITROSTAT Place 1 tablet (0.4 mg total) under the tongue every 5 (five) minutes as needed for chest pain.   pantoprazole 20 MG tablet Commonly known as:  PROTONIX Take 1 tablet (20 mg total) by mouth daily.   PROBIOTIC PO Take 1 capsule by mouth daily.   tamsulosin 0.4 MG Caps capsule Commonly known as:  FLOMAX Take 1 capsule (0.4 mg total) by mouth daily.   tiZANidine 4 MG capsule Commonly known as:  ZANAFLEX Take 1 capsule (4 mg total) by mouth 4 (four) times daily as needed for muscle spasms. Take 1 tablet  every 6 hours as needed; not to exceed 3 doses in 24 hours.   TRUE METRIX BLOOD GLUCOSE TEST test strip Generic drug:  glucose blood USE TO TEST BLOOD SUGAR THREE TIMES DAILY AS DIRECTED   Vitamin D 1000 units capsule Take 1,000 Units by mouth at bedtime.        SignedEarleen Newport 10/25/2017, 7:17 PM

## 2017-10-25 NOTE — Progress Notes (Signed)
U.A results came back, paged physician to discuss and plan of discharge.

## 2017-10-25 NOTE — Progress Notes (Signed)
Occupational Therapy Treatment Patient Details Name: William Hendrix MRN: [id number] DOB: April 16, 1941 Today's Date: 10/25/2017    History of present illness patient is a 77 yo male s/p Anterolateral decompression and arthrodesis L2-3 L3-4 L4-5    OT comments  Pt progressing towards established OT goals. Pt able to recall 3/3 back precautions and demonstrating understanding of AE for LB ADLs. Pt donning underwear with Min Guard for safety in standing. Pt demonstrating understanding of brace management. Answering all pt questions in preparation for possible dc later today. All acute OT needs met and will sign off. Thank you.    Follow Up Recommendations  Supervision/Assistance - 24 hour    Equipment Recommendations  3 in 1 bedside commode    Recommendations for Other Services      Precautions / Restrictions Precautions Precautions: Back Precaution Comments: Pt able to recall 3/3 back precautions Required Braces or Orthoses: Spinal Brace Spinal Brace: Lumbar corset;Applied in sitting position Restrictions Weight Bearing Restrictions: No       Mobility Bed Mobility Overal bed mobility: Modified Independent             General bed mobility comments: In recliner upon arrival. Reviewed log roll for carry over to home  Transfers Overall transfer level: Modified independent               General transfer comment: increased time    Balance Overall balance assessment: No apparent balance deficits (not formally assessed)   Sitting balance-Leahy Scale: Fair Sitting balance - Comments: able to sit without assist     Standing balance-Leahy Scale: Fair Standing balance comment: Able to perform grooming at sink without UE support                           ADL either performed or assessed with clinical judgement   ADL Overall ADL's : Needs assistance/impaired     Grooming: Min guard;Standing;Wash/dry hands Grooming Details (indicate cue type and reason):  Min Guard for safety while performing hand hygiene at sink         Upper Body Dressing : Min guard;Standing Upper Body Dressing Details (indicate cue type and reason): Readjust brace - brace rising due to increaed body habitus.  Min Gaurd for safety. Pt demonstrating understanding of brace management Lower Body Dressing: Sit to/from stand;With adaptive equipment;Min guard Lower Body Dressing Details (indicate cue type and reason): Reviewed AE for LB ADLs. Pt reporting he has AE from prior back surgeried. Pt donning underwear with AE and Min guard A for safety into standing.      Toileting- Water quality scientist and Hygiene: Min guard;Sit to/from stand Toileting - Clothing Manipulation Details (indicate cue type and reason): Min Guard for safety for peri care after urination. Pt reports his wife will assist with toilet hygiene after BM     Functional mobility during ADLs: Min guard;Rolling walker General ADL Comments: Pt demonstrating understanding of all education and reports wife will be present to assist as needed.      Vision   Vision Assessment?: No apparent visual deficits   Perception     Praxis      Cognition Arousal/Alertness: Awake/alert Behavior During Therapy: WFL for tasks assessed/performed Overall Cognitive Status: Within Functional Limits for tasks assessed  Exercises     Shoulder Instructions       General Comments      Pertinent Vitals/ Pain       Pain Assessment: 0-10 Pain Score: 7  Pain Location: back Pain Descriptors / Indicators: Grimacing;Guarding;Sore Pain Intervention(s): Monitored during session;Limited activity within patient's tolerance;Repositioned;Patient requesting pain meds-RN notified  Home Living                                          Prior Functioning/Environment              Frequency  Min 2X/week        Progress Toward Goals  OT  Goals(current goals can now be found in the care plan section)  Progress towards OT goals: Progressing toward goals  Acute Rehab OT Goals Patient Stated Goal: to feel better OT Goal Formulation: With patient Time For Goal Achievement: 11/06/17 Potential to Achieve Goals: Good ADL Goals Pt Will Perform Grooming: with modified independence;standing Pt Will Perform Lower Body Bathing: with set-up;with adaptive equipment;sit to/from stand Pt Will Perform Upper Body Dressing: with set-up;sitting Pt Will Perform Lower Body Dressing: with min assist;with caregiver independent in assisting;with adaptive equipment;sit to/from stand Pt Will Transfer to Toilet: with modified independence;ambulating;regular height toilet Pt Will Perform Toileting - Clothing Manipulation and hygiene: with caregiver independent in assisting;with adaptive equipment;with min guard assist;sit to/from stand Additional ADL Goal #1: Pt will recall 3/3 back precautions with no verbal cues prior to participation in ADL activity  Plan Discharge plan remains appropriate    Co-evaluation                 AM-PAC PT "6 Clicks" Daily Activity     Outcome Measure   Help from another person eating meals?: None Help from another person taking care of personal grooming?: A Little Help from another person toileting, which includes using toliet, bedpan, or urinal?: A Lot Help from another person bathing (including washing, rinsing, drying)?: A Little Help from another person to put on and taking off regular upper body clothing?: A Little Help from another person to put on and taking off regular lower body clothing?: A Little 6 Click Score: 18    End of Session Equipment Utilized During Treatment: Back brace;Rolling walker  OT Visit Diagnosis: Unsteadiness on feet (R26.81);Other abnormalities of gait and mobility (R26.89);Pain Pain - part of body: (back)   Activity Tolerance Patient tolerated treatment well   Patient Left  with call bell/phone within reach;in chair;with nursing/sitter in room   Nurse Communication Mobility status        Time: 1020-1036 OT Time Calculation (min): 16 min  Charges: OT General Charges $OT Visit: 1 Visit OT Treatments $Self Care/Home Management : 8-22 mins  [place], OTR/L Acute Rehab Pager: [phone] Office: [place] 10/25/2017, 11:13 AM

## 2017-10-26 ENCOUNTER — Telehealth: Payer: Self-pay

## 2017-10-26 LAB — URINE CULTURE: Culture: NO GROWTH

## 2017-10-26 NOTE — Telephone Encounter (Signed)
10/26/17  TCM Hospital Follow Up  Transition Care Management Follow-up Telephone Call  ADMISSION DATE: 1/11/201/9 DISCHARGE DATE: 10/25/2017   How have you been since you were released from the hospital? Soreness from surgery   Do you understand why you were in the hospital? Yes   Do you understand the discharge instrcutions? Yes  Items Reviewed:  Medications reviewed: Patient states he was able to get all prescribed medications.   Allergies reviewed: Yes   Dietary changes reviewed: Low Sodium Heart Healthy   Referrals reviewed: Appointment scheduled with E. Saguier for hospital follow up 11/05/17 @ 11:15   Functional Questionnaire:   Activities of Daily Living (ADLs): Patient has to have assistance with all ADL's at this time  Any patient concerns? None at this time   Confirmed importance and date/time of follow-up visits scheduled: Yes    Confirmed with patient if condition begins to worsen call PCP or go to the ER. Yes     Patient was given the office number and encouragred to call back with questions or concerns. Yes

## 2017-11-05 ENCOUNTER — Ambulatory Visit (INDEPENDENT_AMBULATORY_CARE_PROVIDER_SITE_OTHER): Payer: Medicare HMO | Admitting: Family Medicine

## 2017-11-05 ENCOUNTER — Encounter: Payer: Self-pay | Admitting: Family Medicine

## 2017-11-05 ENCOUNTER — Inpatient Hospital Stay: Payer: Medicare HMO | Admitting: Medical

## 2017-11-05 VITALS — BP 128/70 | HR 85 | Temp 97.7°F | Resp 18 | Wt 246.0 lb

## 2017-11-05 DIAGNOSIS — D649 Anemia, unspecified: Secondary | ICD-10-CM | POA: Diagnosis not present

## 2017-11-05 DIAGNOSIS — I10 Essential (primary) hypertension: Secondary | ICD-10-CM

## 2017-11-05 DIAGNOSIS — M549 Dorsalgia, unspecified: Secondary | ICD-10-CM

## 2017-11-05 DIAGNOSIS — E1165 Type 2 diabetes mellitus with hyperglycemia: Secondary | ICD-10-CM

## 2017-11-05 DIAGNOSIS — N183 Chronic kidney disease, stage 3 unspecified: Secondary | ICD-10-CM

## 2017-11-05 DIAGNOSIS — Z794 Long term (current) use of insulin: Secondary | ICD-10-CM | POA: Diagnosis not present

## 2017-11-05 DIAGNOSIS — M48062 Spinal stenosis, lumbar region with neurogenic claudication: Secondary | ICD-10-CM

## 2017-11-05 DIAGNOSIS — K5900 Constipation, unspecified: Secondary | ICD-10-CM | POA: Diagnosis not present

## 2017-11-05 DIAGNOSIS — Z6837 Body mass index (BMI) 37.0-37.9, adult: Secondary | ICD-10-CM | POA: Diagnosis not present

## 2017-11-05 DIAGNOSIS — E1159 Type 2 diabetes mellitus with other circulatory complications: Secondary | ICD-10-CM | POA: Diagnosis not present

## 2017-11-05 DIAGNOSIS — IMO0002 Reserved for concepts with insufficient information to code with codable children: Secondary | ICD-10-CM

## 2017-11-05 LAB — CBC WITH DIFFERENTIAL/PLATELET
BASOS PCT: 0.5 % (ref 0.0–3.0)
Basophils Absolute: 0 10*3/uL (ref 0.0–0.1)
EOS ABS: 0.2 10*3/uL (ref 0.0–0.7)
Eosinophils Relative: 1.8 % (ref 0.0–5.0)
HEMATOCRIT: 35.3 % — AB (ref 39.0–52.0)
HEMOGLOBIN: 12 g/dL — AB (ref 13.0–17.0)
LYMPHS PCT: 27.8 % (ref 12.0–46.0)
Lymphs Abs: 2.4 10*3/uL (ref 0.7–4.0)
MCHC: 33.9 g/dL (ref 30.0–36.0)
MCV: 93.2 fl (ref 78.0–100.0)
MONOS PCT: 5.9 % (ref 3.0–12.0)
Monocytes Absolute: 0.5 10*3/uL (ref 0.1–1.0)
Neutro Abs: 5.5 10*3/uL (ref 1.4–7.7)
Neutrophils Relative %: 64 % (ref 43.0–77.0)
Platelets: 377 10*3/uL (ref 150.0–400.0)
RBC: 3.79 Mil/uL — ABNORMAL LOW (ref 4.22–5.81)
RDW: 13.8 % (ref 11.5–15.5)
WBC: 8.5 10*3/uL (ref 4.0–10.5)

## 2017-11-05 LAB — COMPREHENSIVE METABOLIC PANEL
ALBUMIN: 3.8 g/dL (ref 3.5–5.2)
ALT: 12 U/L (ref 0–53)
AST: 17 U/L (ref 0–37)
Alkaline Phosphatase: 117 U/L (ref 39–117)
BUN: 28 mg/dL — ABNORMAL HIGH (ref 6–23)
CHLORIDE: 103 meq/L (ref 96–112)
CO2: 30 mEq/L (ref 19–32)
Calcium: 9.9 mg/dL (ref 8.4–10.5)
Creatinine, Ser: 1.32 mg/dL (ref 0.40–1.50)
GFR: 55.8 mL/min — AB (ref >60.00)
Glucose, Bld: 115 mg/dL — ABNORMAL HIGH (ref 70–99)
POTASSIUM: 4.3 meq/L (ref 3.5–5.1)
Sodium: 140 mEq/L (ref 135–145)
Total Bilirubin: 0.5 mg/dL (ref 0.2–1.2)
Total Protein: 7.1 g/dL (ref 6.0–8.3)

## 2017-11-05 NOTE — Assessment & Plan Note (Signed)
Had a surgery

## 2017-11-05 NOTE — Patient Instructions (Addendum)
Encouraged increased hydration and fiber in diet. Daily probiotics. If bowels not moving can use MOM 2 tbls po in 4 oz of warm prune juice by mouth every 2-3 days. If no results then repeat in 4 hours with  Dulcolax suppository pr, may repeat again in 4 more hours as needed. Seek care if symptoms worsen. Consider daily Miralax and/or Dulcolax if symptoms persist.   Mix Miralax and beneifiber twice a day Constipation, Adult Constipation is when a person:  Poops (has a bowel movement) fewer times in a week than normal.  Has a hard time pooping.  Has poop that is dry, hard, or bigger than normal.  Follow these instructions at home: Eating and drinking   Eat foods that have a lot of fiber, such as: ? Fresh fruits and vegetables. ? Whole grains. ? Beans.  Eat less of foods that are high in fat, low in fiber, or overly processed, such as: ? [place] fries. ? Hamburgers. ? Cookies. ? Candy. ? Soda.  Drink enough fluid to keep your pee (urine) clear or pale yellow. General instructions  Exercise regularly or as told by your doctor.  Go to the restroom when you feel like you need to poop. Do not hold it in.  Take over-the-counter and prescription medicines only as told by your doctor. These include any fiber supplements.  Do pelvic floor retraining exercises, such as: ? Doing deep breathing while relaxing your lower belly (abdomen). ? Relaxing your pelvic floor while pooping.  Watch your condition for any changes.  Keep all follow-up visits as told by your doctor. This is important. Contact a doctor if:  You have pain that gets worse.  You have a fever.  You have not pooped for 4 days.  You throw up (vomit).  You are not hungry.  You lose weight.  You are bleeding from the anus.  You have thin, pencil-like poop (stool). Get help right away if:  You have a fever, and your symptoms suddenly get worse.  You leak poop or have blood in your poop.  Your belly feels  hard or bigger than normal (is bloated).  You have very bad belly pain.  You feel dizzy or you faint. This information is not intended to replace advice given to you by your health care provider. Make sure you discuss any questions you have with your health care provider. Document Released: 03/16/2008 Document Revised: 04/17/2016 Document Reviewed: 03/18/2016 Elsevier Interactive Patient Education  2018 [employer].

## 2017-11-05 NOTE — Assessment & Plan Note (Signed)
Encouraged DASH diet, decrease po intake and increase exercise as tolerated. Needs 7-8 hours of sleep nightly. Avoid trans fats, eat small, frequent meals every 4-5 hours with lean proteins, complex carbs and healthy fats. Minimize simple carbs, has had good weight loss with vegetarian and no processed sugars.

## 2017-11-05 NOTE — Assessment & Plan Note (Signed)
Great improvement following with Dr Cruzita Lederer

## 2017-11-05 NOTE — Assessment & Plan Note (Signed)
Well controlled, no changes to meds. Encouraged heart healthy diet such as the DASH diet and exercise as tolerated.  °

## 2017-11-05 NOTE — Progress Notes (Signed)
Subjective:  I acted as a Education administrator for Dr. Charlett Blake. [name], [place]  Patient ID: William Hendrix, male    DOB: December 06, 1940, 77 y.o.   MRN: [id number]  No chief complaint on file.   HPI  Patient is in today for a hospital follow up andStruggling with significant pain s/p his major surgery with neurosurgery Dr Ellene Route on 10/22/17. He had 3 fusions, a cage, rods and multiple screws placed. Pain is still significant enough to keep him from resting comfortably but not worsening. accompanied by his wife. Symptoms are slowly improving. Had some post op constipation but it is gradually improving. No bloody or tarry stool. No polyuria or polydipsia. Denies CP/palp/SOB/HA/congestion/fevers/GI or GU c/o. Taking meds as prescribed  Patient Care Team: [name], MD as PCP - General (Family Medicine) [name], MD (Neurosurgery) Lelon Perla, MD as Consulting Physician (Cardiology) Rutherford Guys, MD as Consulting Physician (Ophthalmology) [name], MD as Consulting Physician (Internal Medicine)   Past Medical History:  Diagnosis Date  . AAA (abdominal aortic aneurysm) ([place]) 12/15/2014   a. Mild aneurysmal dilatation of the infrarenal abdominal aorta 3.2 cm - f/u due by 2019.  . Arthritis    "all over" (07/30/2016)  . CAD (coronary artery disease)    a. stent to LAD 2000. b. possible spasm by cath 2001. c. IVUS/PTCA/DES to mLAD 05/2013. d. PTCA/DES of dRCA into ostial rPDA 07/2013. e. PTCA of OM2, DES to Cx, DES to [facility] 07/2016.  Marland Kitchen Chronic bronchitis ([place])   . Chronic diastolic CHF (congestive heart failure) ([place])   . Chronic lower back pain   . Chronic neck pain   . CKD (chronic kidney disease), stage III ([place])   . Diabetic peripheral neuropathy ([place])   . Ejection fraction    55%, 07/2010, mild inferior hypo  . GERD (gastroesophageal reflux disease)   . Gout   . H/O diverticulitis of colon 01/31/2015  . H/O hiatal hernia   . History of blood transfusion ~ 10/1940   "had  pneumonia"  . History of kidney stones   . HTN (hypertension)   . Hyperlipidemia   . Melanoma of forearm, left ([place]) 02/2011   with wide excision   . Myocardial infarction ([place])   . Nephrolithiasis   . Obesity   . OSA on CPAP     [name] since 2000  . Peripheral neuropathy    both feet  . Positive D-dimer    a. significant elevation ,hospital 07/2010, etiology unclear. b. D Dimer chronically > 20.  . Skin cancer   . Spinal stenosis    a. s/p surgical repair 2013  . Spinal stenosis of lumbar region   . Type II diabetes mellitus ([place])   . Ventral hernia     Past Surgical History:  Procedure Laterality Date  . ANTERIOR LAT LUMBAR FUSION  10/22/2017   Procedure: Lumbar two-three Lumbar three-four Lumbar four-five Anteriolateral lumbar interbody fusion with percutaneous pedicle screw fixation and infuse;  Surgeon: [name], MD;  Location: [facility];  Service: Neurosurgery;;  . APPLICATION OF ROBOTIC ASSISTANCE FOR SPINAL PROCEDURE  10/22/2017   Procedure: APPLICATION OF ROBOTIC ASSISTANCE FOR SPINAL PROCEDURE;  Surgeon: [name], MD;  Location: [facility];  Service: Neurosurgery;;  . BACK SURGERY    . CARDIAC CATHETERIZATION N/A 07/30/2016   Procedure: Left Heart Cath and Coronary Angiography;  Surgeon: Nelva Bush, MD;  Location: [facility];  Service: Cardiovascular;  Laterality: N/A;  . CARDIAC CATHETERIZATION N/A 07/30/2016  Procedure: Coronary Stent Intervention;  Surgeon: Nelva Bush, MD;  Location: [facility] CV [facility];  Service: Cardiovascular;  Laterality: N/A;  Mid CFX and MID RCA  . CARDIAC CATHETERIZATION N/A 07/30/2016   Procedure: Coronary Balloon Angioplasty;  Surgeon: Nelva Bush, MD;  Location: [facility];  Service: Cardiovascular;  Laterality: N/A;  OM 1  . CARPAL TUNNEL RELEASE Left   . CATARACT EXTRACTION W/ INTRAOCULAR LENS  IMPLANT, BILATERAL Bilateral ~ 2014  . CERVICAL DISC SURGERY  1990s  . CORONARY ANGIOPLASTY WITH STENT PLACEMENT   05/2013  . CORONARY ANGIOPLASTY WITH STENT PLACEMENT  07/26/2013   DES to RCA extending to PDA    . CORONARY ANGIOPLASTY WITH STENT PLACEMENT  07/30/2016  . CORONARY ANGIOPLASTY WITH STENT PLACEMENT  2000   CAD  . DENTAL SURGERY  04/2016   "got infected; had to dig it out"  . EYE SURGERY    . KNEE ARTHROSCOPY Right 1990's   rt  . LEFT AND RIGHT HEART CATHETERIZATION WITH CORONARY ANGIOGRAM N/A 07/26/2013   Procedure: LEFT AND RIGHT HEART CATHETERIZATION WITH CORONARY ANGIOGRAM;  Surgeon: [name], MD;  Location: [facility] CATH LAB;  Service: Cardiovascular;  Laterality: N/A;  . LEFT HEART CATH AND CORONARY ANGIOGRAPHY N/A 02/26/2017   Procedure: Left Heart Cath and Coronary Angiography;  Surgeon: Sherren Mocha, MD;  Location: [facility];  Service: Cardiovascular;  Laterality: N/A;  . LEFT HEART CATHETERIZATION WITH CORONARY ANGIOGRAM N/A 05/19/2013   Procedure: LEFT HEART CATHETERIZATION WITH CORONARY ANGIOGRAM;  Surgeon: [name], MD;  Location: [facility] CATH LAB;  Service: Cardiovascular;  Laterality: N/A;  . LUMBAR LAMINECTOMY/DECOMPRESSION MICRODISCECTOMY  08/30/2012   Procedure: LUMBAR LAMINECTOMY/DECOMPRESSION MICRODISCECTOMY 2 LEVELS;  Surgeon: [name], MD;  Location: [facility] NEURO ORS;  Service: Neurosurgery;  Laterality: Bilateral;  Bilateral Lumbar three-four Lumbar four-five Laminotomies  . LUMBAR PERCUTANEOUS PEDICLE SCREW 1 LEVEL Bilateral 10/22/2017   Procedure: LUMBAR PERCUTANEOUS PEDICLE SCREW 1 LEVEL;  Surgeon: [name], MD;  Location: [facility];  Service: Neurosurgery;  Laterality: Bilateral;  . LUMBAR SPINE SURGERY  04/07/2016   Dr. Ellene Route; "?ruptured disc"  . MELANOMA EXCISION Left 02/2011   forearm  . NASAL SINUS SURGERY  1970s   "cut windows in sinus pockets"  . PERCUTANEOUS CORONARY STENT INTERVENTION (PCI-S)  05/19/2013   Procedure: PERCUTANEOUS CORONARY STENT INTERVENTION (PCI-S);  Surgeon: [name], MD;  Location: [facility] CATH LAB;  Service:  Cardiovascular;;  . ULNAR TUNNEL RELEASE Left 04/07/2016   Dr. Ellene Route    Family History  Problem Relation Age of Onset  . Cancer Mother        intestinal   . Heart disease Mother   . Cancer Father        prostate  . Migraines Daughter   . Leukemia Sister   . Heart disease Sister   . Prostate cancer Unknown   . Kidney cancer Unknown   . Cancer Unknown        Bladder cancer  . Coronary artery disease Unknown   . Hypertension Sister   . Hypertension Brother   . Heart attack Neg Hx   . Stroke Neg Hx     Social History   Socioeconomic History  . Marital status: Married    Spouse name: Not on file  . Number of children: Not on file  . Years of education: Not on file  . Highest education level: Not on file  Social Needs  . Financial resource strain: Not on file  .  Food insecurity - worry: Not on file  . Food insecurity - inability: Not on file  . Transportation needs - medical: Not on file  . Transportation needs - non-medical: Not on file  Occupational History  . Occupation: Painter  Tobacco Use  . Smoking status: Former Smoker    Packs/day: 3.00    Years: 30.00    Pack years: 90.00    Types: Cigarettes    Last attempt to quit: 09/17/1996    Years since quitting: 21.1  . Smokeless tobacco: Never Used  Substance and Sexual Activity  . Alcohol use: No  . Drug use: No  . Sexual activity: Not Currently    Birth control/protection: None  Other Topics Concern  . Not on file  Social History Narrative   Married and lives locally with his wife.  Sharyon Cable.    Outpatient Medications Prior to Visit  Medication Sig Dispense Refill  . acetaminophen (TYLENOL) 500 MG tablet Take 1,000 mg by mouth 2 (two) times daily.    Marland Kitchen albuterol (PROVENTIL HFA;VENTOLIN HFA) 108 (90 Base) MCG/ACT inhaler Inhale 2 puffs into the lungs every 6 (six) hours as needed for wheezing or shortness of breath. 1 Inhaler 0  . allopurinol (ZYLOPRIM) 300 MG tablet TAKE 1 TABLET EVERY DAY (Patient taking  differently: Take 300 mg by mouth every day) 90 tablet 1  . aspirin EC 81 MG tablet Take 81 mg by mouth daily.    Marland Kitchen atorvastatin (LIPITOR) 40 MG tablet Take 1 tablet (40 mg total) by mouth daily. 90 tablet 1  . B Complex Vitamins (B COMPLEX-B12 PO) Take 1 tablet by mouth at bedtime.    . BD INSULIN SYRINGE ULTRAFINE 31G X 5/16" 1 ML MISC USE  TO INJECT  INSULIN  FIVE  TIMES  DAILY AS INSTRUCTED 450 each 3  . benzonatate (TESSALON) 100 MG capsule Take 1 capsule (100 mg total) by mouth 3 (three) times daily as needed for cough. 21 capsule 0  . chlorpheniramine (CHLOR-TRIMETON) 4 MG tablet Take 4 mg by mouth daily.    . Cholecalciferol (VITAMIN D) 1000 UNITS capsule Take 1,000 Units by mouth at bedtime.     . clopidogrel (PLAVIX) 75 MG tablet Take 1 tablet (75 mg total) by mouth daily. 90 tablet 3  . fenofibrate 160 MG tablet Take 1 tablet (160 mg total) by mouth at bedtime. 90 tablet 1  . fluocinonide cream (LIDEX) 3.54 % Apply 1 application topically daily as needed (for rash).     . fluticasone (FLONASE) 50 MCG/ACT nasal spray Place 2 sprays into both nostrils daily. 16 g 1  . furosemide (LASIX) 40 MG tablet TAKE 1 AND 1/2 TABLETS EVERY DAY (Patient taking differently: Take 60 mg by mouth every day) 135 tablet 2  . HYDROcodone-acetaminophen (NORCO/VICODIN) 5-325 MG tablet Take 1-2 tablets by mouth every 4 (four) hours as needed for severe pain ((score 7 to 10)). 60 tablet 0  . insulin NPH Human (NOVOLIN N) 100 UNIT/ML injection Inject 45 units in am and 55 in the evening (Patient taking differently: Inject 45-50 Units into the skin See admin instructions. Inject 45 units SQ in the morning and inject 50 units SQ at bedtime) 30 mL 3  . insulin regular (NOVOLIN R RELION) 100 units/mL injection Inject 0.25-0.3 mLs (25-30 Units total) into the skin 3 (three) times daily before meals. (Patient taking differently: Inject 35 Units into the skin 3 (three) times daily before meals. ) 70 mL 3  . isosorbide  mononitrate (IMDUR)  30 MG 24 hr tablet Take 1 tablet (30 mg total) by mouth daily. 90 tablet 3  . methocarbamol (ROBAXIN) 500 MG tablet Take 1 tablet (500 mg total) by mouth every 6 (six) hours as needed for muscle spasms. 40 tablet 3  . metoprolol tartrate (LOPRESSOR) 50 MG tablet Take 1 tablet (50 mg total) by mouth 2 (two) times daily. 180 tablet 1  . mupirocin ointment (BACTROBAN) 2 % Apply to area thin film twice daily 22 g 0  . nitroGLYCERIN (NITROSTAT) 0.4 MG SL tablet Place 1 tablet (0.4 mg total) under the tongue every 5 (five) minutes as needed for chest pain. 25 tablet 3  . pantoprazole (PROTONIX) 20 MG tablet Take 1 tablet (20 mg total) by mouth daily. 90 tablet 3  . Probiotic Product (PROBIOTIC PO) Take 1 capsule by mouth daily.    . tamsulosin (FLOMAX) 0.4 MG CAPS capsule Take 1 capsule (0.4 mg total) by mouth daily. 90 capsule 1  . tiZANidine (ZANAFLEX) 4 MG capsule Take 1 capsule (4 mg total) by mouth 4 (four) times daily as needed for muscle spasms. Take 1 tablet every 6 hours as needed; not to exceed 3 doses in 24 hours. 40 capsule 2  . TRUE METRIX BLOOD GLUCOSE TEST test strip USE TO TEST BLOOD SUGAR THREE TIMES DAILY AS DIRECTED 300 each 3   No facility-administered medications prior to visit.     Allergies  Allergen Reactions  . Levemir [Insulin Detemir] Swelling and Other (See Comments)    Patient had redness and swelling and tenderness at injection site.  . Codeine Other (See Comments)    Makes him "shakey", is OK with hydrocodone GI UPSET & TREMORS  . Lipitor [Atorvastatin] Other (See Comments)    Muscle aches; liver functions. Patient is currently taken  . Novolog Mix [Insulin Aspart Prot & Aspart] Other (See Comments)    Causes skin to swell/itch at injection site  . Other Other (See Comments)    Brilinta caused SOB    Review of Systems  Constitutional: Negative for fever and malaise/fatigue.  HENT: Negative for congestion.   Eyes: Negative for blurred  vision.  Respiratory: Negative for shortness of breath.   Cardiovascular: Negative for chest pain, palpitations and leg swelling.  Gastrointestinal: Positive for constipation. Negative for abdominal pain, blood in stool and nausea.  Genitourinary: Negative for dysuria and frequency.  Musculoskeletal: Positive for back pain and joint pain. Negative for falls.  Skin: Negative for rash.  Neurological: Negative for dizziness, loss of consciousness and headaches.  Endo/Heme/Allergies: Negative for environmental allergies.  Psychiatric/Behavioral: Negative for depression. The patient is not nervous/anxious.        Objective:    Physical Exam  Constitutional: He is oriented to person, place, and time. He appears well-developed and well-nourished. No distress.  HENT:  Head: Normocephalic and atraumatic.  Nose: Nose normal.  Eyes: Right eye exhibits no discharge. Left eye exhibits no discharge.  Neck: Normal range of motion. Neck supple.  Cardiovascular: Normal rate and regular rhythm.  Pulmonary/Chest: Effort normal and breath sounds normal.  Abdominal: Soft. Bowel sounds are normal. There is no tenderness.  Musculoskeletal: He exhibits no edema.  Neurological: He is alert and oriented to person, place, and time.  Skin: Skin is warm and dry.  Incision sites on back healing well.no redness, fluctuance, swelling or drainage. Bandages still in place   Psychiatric: He has a normal mood and affect.  Nursing note and vitals reviewed.   BP 128/70 (BP Location:  Left Arm, Patient Position: Sitting, Cuff Size: Normal)   Pulse 85   Temp 97.7 F (36.5 C) (Oral)   Resp 18   Wt 246 lb (111.6 kg)   SpO2 97%   BMI 34.80 kg/m  Wt Readings from Last 3 Encounters:  11/05/17 246 lb (111.6 kg)  10/20/17 261 lb 8 oz (118.6 kg)  09/29/17 236 lb 3.2 oz (107.1 kg)   BP Readings from Last 3 Encounters:  11/05/17 128/70  10/25/17 112/60  10/20/17 (!) 123/53     Immunization History  Administered  Date(s) Administered  . Influenza Split 08/08/2012  . Influenza Whole 08/08/2009, 07/12/2010  . Influenza, High Dose Seasonal PF 08/02/2014, 07/11/2015, 08/26/2017  . Influenza,inj,Quad PF,6+ Mos 07/27/2013, 08/04/2016  . Pneumococcal Conjugate-13 08/02/2014  . Pneumococcal Polysaccharide-23 07/28/2005    Health Maintenance  Topic Date Due  . TETANUS/TDAP  03/13/1959  . URINE MICROALBUMIN  08/03/2015  . FOOT EXAM  05/21/2016  . OPHTHALMOLOGY EXAM  09/29/2016  . HEMOGLOBIN A1C  03/09/2018  . INFLUENZA VACCINE  Completed  . PNA vac Low Risk Adult  Completed    Lab Results  Component Value Date   WBC 8.5 11/05/2017   HGB 12.0 (L) 11/05/2017   HCT 35.3 (L) 11/05/2017   PLT 377.0 11/05/2017   GLUCOSE 115 (H) 11/05/2017   CHOL 114 08/26/2017   TRIG 206.0 (H) 08/26/2017   HDL 24.60 (L) 08/26/2017   LDLDIRECT 63.0 08/26/2017   LDLCALC 31 04/09/2014   ALT 12 11/05/2017   AST 17 11/05/2017   NA 140 11/05/2017   K 4.3 11/05/2017   CL 103 11/05/2017   CREATININE 1.32 11/05/2017   BUN 28 (H) 11/05/2017   CO2 30 11/05/2017   TSH 2.15 08/26/2017   INR 1.1 02/24/2017   HGBA1C 6.3 09/09/2017   MICROALBUR 0.7 08/02/2014    Lab Results  Component Value Date   TSH 2.15 08/26/2017   Lab Results  Component Value Date   WBC 8.5 11/05/2017   HGB 12.0 (L) 11/05/2017   HCT 35.3 (L) 11/05/2017   MCV 93.2 11/05/2017   PLT 377.0 11/05/2017   Lab Results  Component Value Date   NA 140 11/05/2017   K 4.3 11/05/2017   CO2 30 11/05/2017   GLUCOSE 115 (H) 11/05/2017   BUN 28 (H) 11/05/2017   CREATININE 1.32 11/05/2017   BILITOT 0.5 11/05/2017   ALKPHOS 117 11/05/2017   AST 17 11/05/2017   ALT 12 11/05/2017   PROT 7.1 11/05/2017   ALBUMIN 3.8 11/05/2017   CALCIUM 9.9 11/05/2017   ANIONGAP 9 10/23/2017   GFR 55.80 (L) 11/05/2017   Lab Results  Component Value Date   CHOL 114 08/26/2017   Lab Results  Component Value Date   HDL 24.60 (L) 08/26/2017   Lab Results    Component Value Date   LDLCALC 31 04/09/2014   Lab Results  Component Value Date   TRIG 206.0 (H) 08/26/2017   Lab Results  Component Value Date   CHOLHDL 5 08/26/2017   Lab Results  Component Value Date   HGBA1C 6.3 09/09/2017         Assessment & Plan:   Problem List Items Addressed This Visit    Essential hypertension    Well controlled, no changes to meds. Encouraged heart healthy diet such as the DASH diet and exercise as tolerated.       Relevant Orders   Comprehensive metabolic panel (Completed)   CBC w/Diff (Completed)   Uncontrolled type  2 diabetes mellitus with circulatory disorder, with long-term current use of insulin (HCC)    Great improvement following with Dr Cruzita Lederer      Obesity    Encouraged DASH diet, decrease po intake and increase exercise as tolerated. Needs 7-8 hours of sleep nightly. Avoid trans fats, eat small, frequent meals every 4-5 hours with lean proteins, complex carbs and healthy fats. Minimize simple carbs, has had good weight loss with vegetarian and no processed sugars.       CKD (chronic kidney disease), stage III ([place])    Was mildly elevated after surgery stable on recheck today. Encouraged adequate hydration      Lumbar stenosis with neurogenic claudication    Had a surgery        Other Visit Diagnoses    Anemia, unspecified type    -  Primary   Relevant Orders   CBC w/Diff (Completed)      I am having William Hendrix maintain his Vitamin D, fluocinonide cream, aspirin EC, acetaminophen, nitroGLYCERIN, B Complex Vitamins (B COMPLEX-B12 PO), albuterol, pantoprazole, tiZANidine, clopidogrel, isosorbide mononitrate, BD INSULIN SYRINGE ULTRAFINE, TRUE METRIX BLOOD GLUCOSE TEST, allopurinol, furosemide, benzonatate, fluticasone, insulin regular, insulin NPH Human, mupirocin ointment, chlorpheniramine, atorvastatin, fenofibrate, metoprolol tartrate, tamsulosin, Probiotic Product (PROBIOTIC PO), HYDROcodone-acetaminophen, and  methocarbamol.  No orders of the defined types were placed in this encounter.   CMA served as Education administrator during this visit. History, Physical and Plan performed by medical provider. Documentation and orders reviewed and attested to.  Penni Homans, MD

## 2017-11-07 DIAGNOSIS — K5900 Constipation, unspecified: Secondary | ICD-10-CM | POA: Insufficient documentation

## 2017-11-07 DIAGNOSIS — D649 Anemia, unspecified: Secondary | ICD-10-CM | POA: Insufficient documentation

## 2017-11-07 NOTE — Assessment & Plan Note (Signed)
Noted post op and improving on recheck today. Encouraged temporary supplementation.

## 2017-11-07 NOTE — Assessment & Plan Note (Signed)
Struggling with significant pain s/p his major surgery with neurosurgery Dr Ellene Route on 10/22/17. He had 3 fusions, a cage, rods and multiple screws placed. Pain is still significant enough to keep him from resting comfortably but not worsening. He has follow up with neurosurgery next week. No new symptoms such as incontinence or fevers. He will seek care if his symptoms worsen.

## 2017-11-07 NOTE — Assessment & Plan Note (Signed)
Was worse immediately post op and improving but still struggles at times. Encouraged increased hydration and fiber in diet. Daily probiotics. If bowels not moving can use MOM 2 tbls po in 4 oz of warm prune juice by mouth every 2-3 days. If no results then repeat in 4 hours with  Dulcolax suppository pr, may repeat again in 4 more hours as needed. Seek care if symptoms worsen. Consider daily Miralax and/or Dulcolax if symptoms persist.  Mix miralax and benefiber together once to twice daily

## 2017-11-07 NOTE — Assessment & Plan Note (Signed)
Was mildly elevated after surgery stable on recheck today. Encouraged adequate hydration

## 2017-11-08 NOTE — Progress Notes (Signed)
HPI: FU CAD. Pt has had previous PCI of his LAD. Lower extremity Dopplers January 2015 normal. Patient had admission October 2017 with NSTEMI. Echocardiogram October 2017 showed ejection fraction 45-50%, mild right ventricular enlargement. Venous Dopplers October 2017 showed no DVT. He had PCI of the circumflex and RCA with drug-eluting stents. Cardiac catheterization repeated May 2018 because of dyspnea and chest pain. There was continued patency of stent segments in his RCA, left circumflex and LAD. Left ventricular end-diastolic pressure moderately elevated (23). BNP 9.8. DDimer-0.45.   CT February 2019 showed 3.2 x 3 cm abdominal aortic aneurysm.  Since last seen,  patient denies chest pain dyspnea or syncope.  Current Outpatient Medications  Medication Sig Dispense Refill  . acetaminophen (TYLENOL) 500 MG tablet Take 1,000 mg by mouth 2 (two) times daily.    Marland Kitchen albuterol (PROVENTIL HFA;VENTOLIN HFA) 108 (90 Base) MCG/ACT inhaler Inhale 2 puffs into the lungs every 6 (six) hours as needed for wheezing or shortness of breath. 1 Inhaler 0  . allopurinol (ZYLOPRIM) 300 MG tablet TAKE 1 TABLET EVERY DAY (Patient taking differently: Take 300 mg by mouth every day) 90 tablet 1  . aspirin EC 81 MG tablet Take 81 mg by mouth daily.    Marland Kitchen atorvastatin (LIPITOR) 40 MG tablet Take 1 tablet (40 mg total) by mouth daily. 90 tablet 1  . B Complex Vitamins (B COMPLEX-B12 PO) Take 1 tablet by mouth at bedtime.    . BD INSULIN SYRINGE ULTRAFINE 31G X 5/16" 1 ML MISC USE  TO INJECT  INSULIN  FIVE  TIMES  DAILY AS INSTRUCTED 450 each 3  . benzonatate (TESSALON) 100 MG capsule Take 1 capsule (100 mg total) by mouth 3 (three) times daily as needed for cough. 21 capsule 0  . chlorpheniramine (CHLOR-TRIMETON) 4 MG tablet Take 4 mg by mouth daily.    . Cholecalciferol (VITAMIN D) 1000 UNITS capsule Take 1,000 Units by mouth at bedtime.     . ciprofloxacin (CIPRO) 500 MG tablet Take 1 tablet (500 mg total) by mouth  2 (two) times daily. 10 tablet 0  . clopidogrel (PLAVIX) 75 MG tablet Take 1 tablet (75 mg total) by mouth daily. 90 tablet 3  . fenofibrate 160 MG tablet Take 1 tablet (160 mg total) by mouth at bedtime. 90 tablet 1  . fluocinonide cream (LIDEX) 7.62 % Apply 1 application topically daily as needed (for rash).     . fluticasone (FLONASE) 50 MCG/ACT nasal spray Place 2 sprays into both nostrils daily. 16 g 1  . furosemide (LASIX) 40 MG tablet TAKE 1 AND 1/2 TABLETS EVERY DAY (Patient taking differently: Take 60 mg by mouth every day) 135 tablet 2  . HYDROcodone-acetaminophen (NORCO/VICODIN) 5-325 MG tablet Take 1-2 tablets by mouth every 4 (four) hours as needed for severe pain ((score 7 to 10)). 60 tablet 0  . insulin NPH Human (NOVOLIN N) 100 UNIT/ML injection Inject 45 units in am and 55 in the evening (Patient taking differently: Inject 45-50 Units into the skin See admin instructions. Inject 45 units SQ in the morning and inject 50 units SQ at bedtime) 30 mL 3  . insulin regular (NOVOLIN R RELION) 100 units/mL injection Inject 0.25-0.3 mLs (25-30 Units total) into the skin 3 (three) times daily before meals. (Patient taking differently: Inject 35 Units into the skin 3 (three) times daily before meals. ) 70 mL 3  . isosorbide mononitrate (IMDUR) 30 MG 24 hr tablet Take 1 tablet (30  mg total) by mouth daily. 90 tablet 3  . methocarbamol (ROBAXIN) 500 MG tablet Take 1 tablet (500 mg total) by mouth every 6 (six) hours as needed for muscle spasms. 40 tablet 3  . metoprolol tartrate (LOPRESSOR) 50 MG tablet Take 1 tablet (50 mg total) by mouth 2 (two) times daily. 180 tablet 1  . mupirocin ointment (BACTROBAN) 2 % Apply to area thin film twice daily 22 g 0  . nitroGLYCERIN (NITROSTAT) 0.4 MG SL tablet Place 1 tablet (0.4 mg total) under the tongue every 5 (five) minutes as needed for chest pain. 25 tablet 3  . pantoprazole (PROTONIX) 20 MG tablet Take 1 tablet (20 mg total) by mouth daily. 90 tablet 3    . Probiotic Product (PROBIOTIC PO) Take 1 capsule by mouth daily.    . tamsulosin (FLOMAX) 0.4 MG CAPS capsule Take 1 capsule (0.4 mg total) by mouth daily. 90 capsule 1  . tiZANidine (ZANAFLEX) 4 MG capsule Take 1 capsule (4 mg total) by mouth 4 (four) times daily as needed for muscle spasms. Take 1 tablet every 6 hours as needed; not to exceed 3 doses in 24 hours. 40 capsule 2  . TRUE METRIX BLOOD GLUCOSE TEST test strip USE TO TEST BLOOD SUGAR THREE TIMES DAILY AS DIRECTED 300 each 3   No current facility-administered medications for this visit.      Past Medical History:  Diagnosis Date  . AAA (abdominal aortic aneurysm) ([place]) 12/15/2014   a. Mild aneurysmal dilatation of the infrarenal abdominal aorta 3.2 cm - f/u due by 2019.  . Arthritis    "all over" (07/30/2016)  . CAD (coronary artery disease)    a. stent to LAD 2000. b. possible spasm by cath 2001. c. IVUS/PTCA/DES to mLAD 05/2013. d. PTCA/DES of dRCA into ostial rPDA 07/2013. e. PTCA of OM2, DES to Cx, DES to [facility] 07/2016.  Marland Kitchen Chronic bronchitis ([place])   . Chronic diastolic CHF (congestive heart failure) ([place])   . Chronic lower back pain   . Chronic neck pain   . CKD (chronic kidney disease), stage III ([place])   . Diabetic peripheral neuropathy ([place])   . Ejection fraction    55%, 07/2010, mild inferior hypo  . GERD (gastroesophageal reflux disease)   . Gout   . H/O diverticulitis of colon 01/31/2015  . H/O hiatal hernia   . History of blood transfusion ~ 10/1940   "had pneumonia"  . History of kidney stones   . HTN (hypertension)   . Hyperlipidemia   . Melanoma of forearm, left ([place]) 02/2011   with wide excision   . Myocardial infarction ([place])   . Nephrolithiasis   . Obesity   . OSA on CPAP     [name] since 2000  . Peripheral neuropathy    both feet  . Positive D-dimer    a. significant elevation ,hospital 07/2010, etiology unclear. b. D Dimer chronically > 20.  . Skin cancer   . Spinal stenosis    a. s/p  surgical repair 2013  . Spinal stenosis of lumbar region   . Type II diabetes mellitus ([place])   . Ventral hernia     Past Surgical History:  Procedure Laterality Date  . ANTERIOR LAT LUMBAR FUSION  10/22/2017   Procedure: Lumbar two-three Lumbar three-four Lumbar four-five Anteriolateral lumbar interbody fusion with percutaneous pedicle screw fixation and infuse;  Surgeon: [name], MD;  Location: [facility];  Service: Neurosurgery;;  . APPLICATION OF ROBOTIC ASSISTANCE FOR SPINAL  PROCEDURE  10/22/2017   Procedure: APPLICATION OF ROBOTIC ASSISTANCE FOR SPINAL PROCEDURE;  Surgeon: [name], MD;  Location: [name];  Service: Neurosurgery;;  . BACK SURGERY    . CARDIAC CATHETERIZATION N/A 07/30/2016   Procedure: Left Heart Cath and Coronary Angiography;  Surgeon: Nelva Bush, MD;  Location: [facility];  Service: Cardiovascular;  Laterality: N/A;  . CARDIAC CATHETERIZATION N/A 07/30/2016   Procedure: Coronary Stent Intervention;  Surgeon: Nelva Bush, MD;  Location: [facility] CV [facility];  Service: Cardiovascular;  Laterality: N/A;  Mid CFX and MID RCA  . CARDIAC CATHETERIZATION N/A 07/30/2016   Procedure: Coronary Balloon Angioplasty;  Surgeon: Nelva Bush, MD;  Location: [facility];  Service: Cardiovascular;  Laterality: N/A;  OM 1  . CARPAL TUNNEL RELEASE Left   . CATARACT EXTRACTION W/ INTRAOCULAR LENS  IMPLANT, BILATERAL Bilateral ~ 2014  . CERVICAL DISC SURGERY  1990s  . CORONARY ANGIOPLASTY WITH STENT PLACEMENT  05/2013  . CORONARY ANGIOPLASTY WITH STENT PLACEMENT  07/26/2013   DES to RCA extending to PDA    . CORONARY ANGIOPLASTY WITH STENT PLACEMENT  07/30/2016  . CORONARY ANGIOPLASTY WITH STENT PLACEMENT  2000   CAD  . DENTAL SURGERY  04/2016   "got infected; had to dig it out"  . EYE SURGERY    . KNEE ARTHROSCOPY Right 1990's   rt  . LEFT AND RIGHT HEART CATHETERIZATION WITH CORONARY ANGIOGRAM N/A 07/26/2013   Procedure: LEFT AND RIGHT HEART  CATHETERIZATION WITH CORONARY ANGIOGRAM;  Surgeon: [name], MD;  Location: [facility] CATH LAB;  Service: Cardiovascular;  Laterality: N/A;  . LEFT HEART CATH AND CORONARY ANGIOGRAPHY N/A 02/26/2017   Procedure: Left Heart Cath and Coronary Angiography;  Surgeon: Sherren Mocha, MD;  Location: [place] [facility];  Service: Cardiovascular;  Laterality: N/A;  . LEFT HEART CATHETERIZATION WITH CORONARY ANGIOGRAM N/A 05/19/2013   Procedure: LEFT HEART CATHETERIZATION WITH CORONARY ANGIOGRAM;  Surgeon: [name], MD;  Location[facility] C[facility];  Service: Cardiovascular;  Laterality: N/A;  . LUMBAR LAMINECTOMY/DECOMPRESSION MICRODISCECTOMY  08/30/2012   Procedure: LUMBAR LAMINECTOMY/DECOMPRESSION MICRODISCECTOMY 2 LEVELS;  Surgeon: [name], MD;  Location: [facility] NEURO ORS;  Service: Neurosurgery;  Laterality: Bilateral;  Bilateral Lumbar three-four Lumbar four-five Laminotomies  . LUMBAR PERCUTANEOUS PEDICLE SCREW 1 LEVEL Bilateral 10/22/2017   Procedure: LUMBAR PERCUTANEOUS PEDICLE SCREW 1 LEVEL;  Surgeon: [name], MD;  Location: [facility];  Service: Neurosurgery;  Laterality: Bilateral;  . LUMBAR SPINE SURGERY  04/07/2016   Dr. Ellene Route; "?ruptured disc"  . MELANOMA EXCISION Left 02/2011   forearm  . NASAL SINUS SURGERY  1970s   "cut windows in sinus pockets"  . PERCUTANEOUS CORONARY STENT INTERVENTION (PCI-S)  05/19/2013   Procedure: PERCUTANEOUS CORONARY STENT INTERVENTION (PCI-S);  Surgeon: [name], MD;  Location: [facility] CATH LAB;  Service: Cardiovascular;;  . ULNAR TUNNEL RELEASE Left 04/07/2016   Dr. Ellene Route    Social History   Socioeconomic History  . Marital status: Married    Spouse name: Not on file  . Number of children: Not on file  . Years of education: Not on file  . Highest education level: Not on file  Social Needs  . Financial resource strain: Not on file  . Food insecurity - worry: Not on file  . Food insecurity - inability: Not on file  . Transportation needs -  medical: Not on file  . Transportation needs - non-medical: Not on file  Occupational History  . Occupation: [employer]  Tobacco Use  . Smoking status: Former Smoker    Packs/day: 3.00    Years: 30.00    Pack years: 90.00    Types: Cigarettes    Last attempt to quit: 09/17/1996    Years since quitting: 21.1  . Smokeless tobacco: Never Used  Substance and Sexual Activity  . Alcohol use: No  . Drug use: No  . Sexual activity: Not Currently    Birth control/protection: None  Other Topics Concern  . Not on file  Social History Narrative   Married and lives locally with his wife.  [employer].    Family History  Problem Relation Age of Onset  . Cancer Mother        intestinal   . Heart disease Mother   . Cancer Father        prostate  . Migraines Daughter   . Leukemia Sister   . Heart disease Sister   . Prostate cancer Unknown   . Kidney cancer Unknown   . Cancer Unknown        Bladder cancer  . Coronary artery disease Unknown   . Hypertension Sister   . Hypertension Brother   . Heart attack Neg Hx   . Stroke Neg Hx     ROS: Some back pain following recent surgery but no fevers or chills, productive cough, hemoptysis, dysphasia, odynophagia, melena, hematochezia, dysuria, hematuria, rash, seizure activity, orthopnea, PND, pedal edema, claudication. Remaining systems are negative.  Physical Exam: Well-developed well-nourished in no acute distress.  Skin is warm and dry.  HEENT is normal.  Neck is supple.  Chest is clear to auscultation with normal expansion.  Cardiovascular exam is regular rate and rhythm.  Abdominal exam nontender or distended. No masses palpated. Extremities show no edema. neuro grossly intact   A/P  1 coronary artery disease-last catheterization revealed patent stents.  Plan medical therapy.  Continue aspirin and statin.  Discontinue Plavix.  2 abdominal aortic aneurysm-patient will need follow-up ultrasound Feb 2020.  3 hypertension-blood  pressure is controlled.  Continue present medications.  4 hyperlipidemia-continue statin.  Kirk Ruths, MD

## 2017-11-10 DIAGNOSIS — M48061 Spinal stenosis, lumbar region without neurogenic claudication: Secondary | ICD-10-CM | POA: Diagnosis not present

## 2017-11-15 ENCOUNTER — Ambulatory Visit (HOSPITAL_BASED_OUTPATIENT_CLINIC_OR_DEPARTMENT_OTHER)
Admission: RE | Admit: 2017-11-15 | Discharge: 2017-11-15 | Disposition: A | Discharge status: Home / Self Care | Payer: Medicare HMO | Source: Ambulatory Visit | Attending: Family | Admitting: Family

## 2017-11-15 ENCOUNTER — Encounter: Payer: Self-pay | Admitting: Family

## 2017-11-15 ENCOUNTER — Telehealth: Payer: Self-pay | Admitting: Family

## 2017-11-15 ENCOUNTER — Telehealth: Payer: Self-pay | Admitting: *Deleted

## 2017-11-15 ENCOUNTER — Ambulatory Visit (INDEPENDENT_AMBULATORY_CARE_PROVIDER_SITE_OTHER): Payer: Medicare HMO | Admitting: Family

## 2017-11-15 VITALS — BP 126/57 | HR 76 | Temp 97.1°F | Resp 16 | Ht 70.0 in | Wt 246.0 lb

## 2017-11-15 DIAGNOSIS — K4020 Bilateral inguinal hernia, without obstruction or gangrene, not specified as recurrent: Secondary | ICD-10-CM | POA: Diagnosis not present

## 2017-11-15 DIAGNOSIS — R9341 Abnormal radiologic findings on diagnostic imaging of renal pelvis, ureter, or bladder: Secondary | ICD-10-CM | POA: Insufficient documentation

## 2017-11-15 DIAGNOSIS — R109 Unspecified abdominal pain: Secondary | ICD-10-CM | POA: Diagnosis not present

## 2017-11-15 DIAGNOSIS — R310 Gross hematuria: Secondary | ICD-10-CM | POA: Diagnosis not present

## 2017-11-15 DIAGNOSIS — N400 Enlarged prostate without lower urinary tract symptoms: Secondary | ICD-10-CM | POA: Diagnosis not present

## 2017-11-15 DIAGNOSIS — R319 Hematuria, unspecified: Secondary | ICD-10-CM

## 2017-11-15 DIAGNOSIS — K5730 Diverticulosis of large intestine without perforation or abscess without bleeding: Secondary | ICD-10-CM | POA: Diagnosis not present

## 2017-11-15 DIAGNOSIS — I714 Abdominal aortic aneurysm, without rupture: Secondary | ICD-10-CM | POA: Insufficient documentation

## 2017-11-15 LAB — POC URINALSYSI DIPSTICK (AUTOMATED)
BILIRUBIN UA: NEGATIVE
Glucose, UA: NEGATIVE
KETONES UA: NEGATIVE
Nitrite, UA: POSITIVE
Spec Grav, UA: 1.02 (ref 1.010–1.025)
Urobilinogen, UA: NEGATIVE E.U./dL — AB
pH, UA: 6 (ref 5.0–8.0)

## 2017-11-15 MED ORDER — CIPROFLOXACIN HCL 500 MG PO TABS: 500.0000 mg | ORAL_TABLET | Freq: Two times a day (BID) | ORAL | 0 refills | Status: DC

## 2017-11-15 NOTE — Telephone Encounter (Signed)
Attempted to get approval for STAT CT for tonight. Automated system gave an error message and said could not process request at this time.  Spoke with CSR, Ed and was told that their system is updating and they are unable to pull up any pt information or provide approvals at this time. Pt will complete CT tonight and I will call insurance tomorrow morning.

## 2017-11-15 NOTE — Patient Instructions (Signed)
Start cipro for possible urinary tract infection. Complete lab work prior to leaving. Complete CT scan on the first floor. Go to the ER if you develop fever >101, increasing back pain, or inability to empty your bladder.

## 2017-11-15 NOTE — Progress Notes (Signed)
Subjective:    Patient ID: William Hendrix, male    DOB: November 26, 1940, 77 y.o.   MRN: [id number]  HPI  Patient is a 77 yr old male who presents today with c/o gross hematuria.  He is maintained on plavix and aspirin. He has previous history of kidney stones. Last episode was 30 years ago. Reports that his back pain has been worse, but worse on the right side. Reports dysuria (started this afternoon). Voiding without difficulty. Denies fever.     Review of Systems See HPI  Past Medical History:  Diagnosis Date  . AAA (abdominal aortic aneurysm) ([place]) 12/15/2014   a. Mild aneurysmal dilatation of the infrarenal abdominal aorta 3.2 cm - f/u due by 2019.  . Arthritis    "all over" (07/30/2016)  . CAD (coronary artery disease)    a. stent to LAD 2000. b. possible spasm by cath 2001. c. IVUS/PTCA/DES to mLAD 05/2013. d. PTCA/DES of dRCA into ostial rPDA 07/2013. e. PTCA of OM2, DES to Cx, DES to [facility] 07/2016.  Marland Kitchen Chronic bronchitis ([place])   . Chronic diastolic CHF (congestive heart failure) ([place])   . Chronic lower back pain   . Chronic neck pain   . CKD (chronic kidney disease), stage III ([place])   . Diabetic peripheral neuropathy ([place])   . Ejection fraction    55%, 07/2010, mild inferior hypo  . GERD (gastroesophageal reflux disease)   . Gout   . H/O diverticulitis of colon 01/31/2015  . H/O hiatal hernia   . History of blood transfusion ~ 10/1940   "had pneumonia"  . History of kidney stones   . HTN (hypertension)   . Hyperlipidemia   . Melanoma of forearm, left ([place]) 02/2011   with wide excision   . Myocardial infarction ([place])   . Nephrolithiasis   . Obesity   . OSA on CPAP     [name] since 2000  . Peripheral neuropathy    both feet  . Positive D-dimer    a. significant elevation ,hospital 07/2010, etiology unclear. b. D Dimer chronically > 20.  . Skin cancer   . Spinal stenosis    a. s/p surgical repair 2013  . Spinal stenosis of lumbar region   . Type II diabetes mellitus  ([place])   . Ventral hernia      Social History   Socioeconomic History  . Marital status: Married    Spouse name: Not on file  . Number of children: Not on file  . Years of education: Not on file  . Highest education level: Not on file  Social Needs  . Financial resource strain: Not on file  . Food insecurity - worry: Not on file  . Food insecurity - inability: Not on file  . Transportation needs - medical: Not on file  . Transportation needs - non-medical: Not on file  Occupational History  . Occupation: Painter  Tobacco Use  . Smoking status: Former Smoker    Packs/day: 3.00    Years: 30.00    Pack years: 90.00    Types: Cigarettes    Last attempt to quit: 09/17/1996    Years since quitting: 21.1  . Smokeless tobacco: Never Used  Substance and Sexual Activity  . Alcohol use: No  . Drug use: No  . Sexual activity: Not Currently    Birth control/protection: None  Other Topics Concern  . Not on file  Social History Narrative   Married and lives locally with his wife.  Sharyon Cable.    Past Surgical History:  Procedure Laterality Date  . ANTERIOR LAT LUMBAR FUSION  10/22/2017   Procedure: Lumbar two-three Lumbar three-four Lumbar four-five Anteriolateral lumbar interbody fusion with percutaneous pedicle screw fixation and infuse;  Surgeon: [name], MD;  Location: [place];  Service: Neurosurgery;;  . APPLICATION OF ROBOTIC ASSISTANCE FOR SPINAL PROCEDURE  10/22/2017   Procedure: APPLICATION OF ROBOTIC ASSISTANCE FOR SPINAL PROCEDURE;  Surgeon: [name], MD;  Location: [place];  Service: Neurosurgery;;  . BACK SURGERY    . CARDIAC CATHETERIZATION N/A 07/30/2016   Procedure: Left Heart Cath and Coronary Angiography;  Surgeon: Nelva Bush, MD;  Location: [place] CV LAB;  Service: Cardiovascular;  Laterality: N/A;  . CARDIAC CATHETERIZATION N/A 07/30/2016   Procedure: Coronary Stent Intervention;  Surgeon: Nelva Bush, MD;  Location: [place] CV LAB;  Service:  Cardiovascular;  Laterality: N/A;  Mid CFX and MID RCA  . CARDIAC CATHETERIZATION N/A 07/30/2016   Procedure: Coronary Balloon Angioplasty;  Surgeon: Nelva Bush, MD;  Location: [place] CV LAB;  Service: Cardiovascular;  Laterality: N/A;  OM 1  . CARPAL TUNNEL RELEASE Left   . CATARACT EXTRACTION W/ INTRAOCULAR LENS  IMPLANT, BILATERAL Bilateral ~ 2014  . CERVICAL DISC SURGERY  1990s  . CORONARY ANGIOPLASTY WITH STENT PLACEMENT  05/2013  . CORONARY ANGIOPLASTY WITH STENT PLACEMENT  07/26/2013   DES to RCA extending to PDA    . CORONARY ANGIOPLASTY WITH STENT PLACEMENT  07/30/2016  . CORONARY ANGIOPLASTY WITH STENT PLACEMENT  2000   CAD  . DENTAL SURGERY  04/2016   "got infected; had to dig it out"  . EYE SURGERY    . KNEE ARTHROSCOPY Right 1990's   rt  . LEFT AND RIGHT HEART CATHETERIZATION WITH CORONARY ANGIOGRAM N/A 07/26/2013   Procedure: LEFT AND RIGHT HEART CATHETERIZATION WITH CORONARY ANGIOGRAM;  Surgeon: [name], MD;  Location: [place] CATH LAB;  Service: Cardiovascular;  Laterality: N/A;  . LEFT HEART CATH AND CORONARY ANGIOGRAPHY N/A 02/26/2017   Procedure: Left Heart Cath and Coronary Angiography;  Surgeon: Sherren Mocha, MD;  Location: [place] CV LAB;  Service: Cardiovascular;  Laterality: N/A;  . LEFT HEART CATHETERIZATION WITH CORONARY ANGIOGRAM N/A 05/19/2013   Procedure: LEFT HEART CATHETERIZATION WITH CORONARY ANGIOGRAM;  Surgeon: [name], MD;  Location: [facility] CATH LAB;  Service: Cardiovascular;  Laterality: N/A;  . LUMBAR LAMINECTOMY/DECOMPRESSION MICRODISCECTOMY  08/30/2012   Procedure: LUMBAR LAMINECTOMY/DECOMPRESSION MICRODISCECTOMY 2 LEVELS;  Surgeon: [name], MD;  Location: [place] NEURO ORS;  Service: Neurosurgery;  Laterality: Bilateral;  Bilateral Lumbar three-four Lumbar four-five Laminotomies  . LUMBAR PERCUTANEOUS PEDICLE SCREW 1 LEVEL Bilateral 10/22/2017   Procedure: LUMBAR PERCUTANEOUS PEDICLE SCREW 1 LEVEL;  Surgeon: [name], MD;   Location: [place];  Service: Neurosurgery;  Laterality: Bilateral;  . LUMBAR SPINE SURGERY  04/07/2016   Dr. Ellene Route; "?ruptured disc"  . MELANOMA EXCISION Left 02/2011   forearm  . NASAL SINUS SURGERY  1970s   "cut windows in sinus pockets"  . PERCUTANEOUS CORONARY STENT INTERVENTION (PCI-S)  05/19/2013   Procedure: PERCUTANEOUS CORONARY STENT INTERVENTION (PCI-S);  Surgeon: [name], MD;  Location: [facility] CATH LAB;  Service: Cardiovascular;;  . ULNAR TUNNEL RELEASE Left 04/07/2016   Dr. Ellene Route    Family History  Problem Relation Age of Onset  . Cancer Mother        intestinal   . Heart disease Mother   . Cancer Father  prostate  . Migraines Daughter   . Leukemia Sister   . Heart disease Sister   . Prostate cancer Unknown   . Kidney cancer Unknown   . Cancer Unknown        Bladder cancer  . Coronary artery disease Unknown   . Hypertension Sister   . Hypertension Brother   . Heart attack Neg Hx   . Stroke Neg Hx     Allergies  Allergen Reactions  . Levemir [Insulin Detemir] Swelling and Other (See Comments)    Patient had redness and swelling and tenderness at injection site.  . Codeine Other (See Comments)    Makes him "shakey", is OK with hydrocodone GI UPSET & TREMORS  . Lipitor [Atorvastatin] Other (See Comments)    Muscle aches; liver functions. Patient is currently taken  . Novolog Mix [Insulin Aspart Prot & Aspart] Other (See Comments)    Causes skin to swell/itch at injection site  . Other Other (See Comments)    Brilinta caused SOB    Current Outpatient Medications on File Prior to Visit  Medication Sig Dispense Refill  . acetaminophen (TYLENOL) 500 MG tablet Take 1,000 mg by mouth 2 (two) times daily.    Marland Kitchen albuterol (PROVENTIL HFA;VENTOLIN HFA) 108 (90 Base) MCG/ACT inhaler Inhale 2 puffs into the lungs every 6 (six) hours as needed for wheezing or shortness of breath. 1 Inhaler 0  . allopurinol (ZYLOPRIM) 300 MG tablet TAKE 1 TABLET EVERY DAY  (Patient taking differently: Take 300 mg by mouth every day) 90 tablet 1  . aspirin EC 81 MG tablet Take 81 mg by mouth daily.    Marland Kitchen atorvastatin (LIPITOR) 40 MG tablet Take 1 tablet (40 mg total) by mouth daily. 90 tablet 1  . B Complex Vitamins (B COMPLEX-B12 PO) Take 1 tablet by mouth at bedtime.    . BD INSULIN SYRINGE ULTRAFINE 31G X 5/16" 1 ML MISC USE  TO INJECT  INSULIN  FIVE  TIMES  DAILY AS INSTRUCTED 450 each 3  . benzonatate (TESSALON) 100 MG capsule Take 1 capsule (100 mg total) by mouth 3 (three) times daily as needed for cough. 21 capsule 0  . chlorpheniramine (CHLOR-TRIMETON) 4 MG tablet Take 4 mg by mouth daily.    . Cholecalciferol (VITAMIN D) 1000 UNITS capsule Take 1,000 Units by mouth at bedtime.     . clopidogrel (PLAVIX) 75 MG tablet Take 1 tablet (75 mg total) by mouth daily. 90 tablet 3  . fenofibrate 160 MG tablet Take 1 tablet (160 mg total) by mouth at bedtime. 90 tablet 1  . fluocinonide cream (LIDEX) 4.13 % Apply 1 application topically daily as needed (for rash).     . fluticasone (FLONASE) 50 MCG/ACT nasal spray Place 2 sprays into both nostrils daily. 16 g 1  . furosemide (LASIX) 40 MG tablet TAKE 1 AND 1/2 TABLETS EVERY DAY (Patient taking differently: Take 60 mg by mouth every day) 135 tablet 2  . HYDROcodone-acetaminophen (NORCO/VICODIN) 5-325 MG tablet Take 1-2 tablets by mouth every 4 (four) hours as needed for severe pain ((score 7 to 10)). 60 tablet 0  . insulin NPH Human (NOVOLIN N) 100 UNIT/ML injection Inject 45 units in am and 55 in the evening (Patient taking differently: Inject 45-50 Units into the skin See admin instructions. Inject 45 units SQ in the morning and inject 50 units SQ at bedtime) 30 mL 3  . insulin regular (NOVOLIN R RELION) 100 units/mL injection Inject 0.25-0.3 mLs (25-30 Units total)  into the skin 3 (three) times daily before meals. (Patient taking differently: Inject 35 Units into the skin 3 (three) times daily before meals. ) 70 mL 3  .  isosorbide mononitrate (IMDUR) 30 MG 24 hr tablet Take 1 tablet (30 mg total) by mouth daily. 90 tablet 3  . methocarbamol (ROBAXIN) 500 MG tablet Take 1 tablet (500 mg total) by mouth every 6 (six) hours as needed for muscle spasms. 40 tablet 3  . metoprolol tartrate (LOPRESSOR) 50 MG tablet Take 1 tablet (50 mg total) by mouth 2 (two) times daily. 180 tablet 1  . mupirocin ointment (BACTROBAN) 2 % Apply to area thin film twice daily 22 g 0  . nitroGLYCERIN (NITROSTAT) 0.4 MG SL tablet Place 1 tablet (0.4 mg total) under the tongue every 5 (five) minutes as needed for chest pain. 25 tablet 3  . pantoprazole (PROTONIX) 20 MG tablet Take 1 tablet (20 mg total) by mouth daily. 90 tablet 3  . Probiotic Product (PROBIOTIC PO) Take 1 capsule by mouth daily.    . tamsulosin (FLOMAX) 0.4 MG CAPS capsule Take 1 capsule (0.4 mg total) by mouth daily. 90 capsule 1  . tiZANidine (ZANAFLEX) 4 MG capsule Take 1 capsule (4 mg total) by mouth 4 (four) times daily as needed for muscle spasms. Take 1 tablet every 6 hours as needed; not to exceed 3 doses in 24 hours. 40 capsule 2  . TRUE METRIX BLOOD GLUCOSE TEST test strip USE TO TEST BLOOD SUGAR THREE TIMES DAILY AS DIRECTED 300 each 3   No current facility-administered medications on file prior to visit.     BP (!) 126/57 (BP Location: Left Arm, Patient Position: Sitting, Cuff Size: Large)   Pulse 76   Resp 16   Ht 5\' 10"  (1.778 m)   Wt 246 lb (111.6 kg)   SpO2 99%   BMI 35.30 kg/m       Objective:   Physical Exam  Constitutional: He is oriented to person, place, and time. He appears well-developed and well-nourished. No distress.  HENT:  Head: Normocephalic and atraumatic.  Cardiovascular: Normal rate and regular rhythm.  No murmur heard. Pulmonary/Chest: Effort normal and breath sounds normal. No respiratory distress. He has no wheezes. He has no rales.  Abdominal: Soft. Bowel sounds are normal. He exhibits no distension and no mass. There is no  tenderness. There is no rebound and no guarding.  Exam limited by obesity  Musculoskeletal: He exhibits no edema.  Neurological: He is alert and oriented to person, place, and time.  Skin: Skin is warm and dry.  Psychiatric: He has a normal mood and affect. His behavior is normal. Thought content normal.          Assessment & Plan:  Gross hematuria-suspect kidney stone. Check CMET, CBC with diff,  begin empiric cipro, send urine for culture.  Obtain CT scan with kidney stone protocol.  Follow up in 2 days. Advised as follows:  Go to the ER if you develop fever >101, increasing back pain, or inability to empty your bladder.

## 2017-11-15 NOTE — Telephone Encounter (Signed)
CT shows possible small kidney stone on the right.  He should continue flomax, start cipro. I will also place referral to urology.  Note is again also made of a small aortic aneurysm. He should plan to repeat US again 3 years to monitor this aneurysm.

## 2017-11-16 LAB — CBC WITH DIFFERENTIAL/PLATELET
BASOS ABS: 0 10*3/uL (ref 0.0–0.1)
BASOS PCT: 0.7 % (ref 0.0–3.0)
EOS ABS: 0.2 10*3/uL (ref 0.0–0.7)
Eosinophils Relative: 3.1 % (ref 0.0–5.0)
HEMATOCRIT: 34.9 % — AB (ref 39.0–52.0)
Hemoglobin: 11.8 g/dL — ABNORMAL LOW (ref 13.0–17.0)
LYMPHS ABS: 2.9 10*3/uL (ref 0.7–4.0)
LYMPHS PCT: 48 % — AB (ref 12.0–46.0)
MCHC: 33.9 g/dL (ref 30.0–36.0)
MCV: 94.4 fl (ref 78.0–100.0)
MONOS PCT: 7.4 % (ref 3.0–12.0)
Monocytes Absolute: 0.4 10*3/uL (ref 0.1–1.0)
NEUTROS ABS: 2.5 10*3/uL (ref 1.4–7.7)
NEUTROS PCT: 40.8 % — AB (ref 43.0–77.0)
PLATELETS: 231 10*3/uL (ref 150.0–400.0)
RBC: 3.69 Mil/uL — ABNORMAL LOW (ref 4.22–5.81)
RDW: 14.4 % (ref 11.5–15.5)
WBC: 6 10*3/uL (ref 4.0–10.5)

## 2017-11-16 LAB — COMPREHENSIVE METABOLIC PANEL
ALT: 12 U/L (ref 0–53)
AST: 16 U/L (ref 0–37)
Albumin: 4 g/dL (ref 3.5–5.2)
Alkaline Phosphatase: 115 U/L (ref 39–117)
BUN: 39 mg/dL — ABNORMAL HIGH (ref 6–23)
CALCIUM: 9.7 mg/dL (ref 8.4–10.5)
CHLORIDE: 105 meq/L (ref 96–112)
CO2: 29 meq/L (ref 19–32)
Creatinine, Ser: 1.48 mg/dL (ref 0.40–1.50)
GFR: 48.9 mL/min — AB (ref >60.00)
GLUCOSE: 73 mg/dL (ref 70–99)
Potassium: 3.6 mEq/L (ref 3.5–5.1)
Sodium: 142 mEq/L (ref 135–145)
Total Bilirubin: 0.3 mg/dL (ref 0.2–1.2)
Total Protein: 6.9 g/dL (ref 6.0–8.3)

## 2017-11-16 NOTE — Telephone Encounter (Signed)
Notified pt of below and he voices understanding and is agreeable to referral.

## 2017-11-16 NOTE — Telephone Encounter (Signed)
Spoke with Elta Guadeloupe at [employer] for authorization of CT.  Ref# [id number] and Was transferred to a third party group for approval at [phone]. Spoke with Elvia Collum and received authorization good from 11/15/17 through 12/15/17; #[id number].

## 2017-11-17 ENCOUNTER — Encounter: Payer: Self-pay | Admitting: Cardiology

## 2017-11-17 ENCOUNTER — Ambulatory Visit (HOSPITAL_BASED_OUTPATIENT_CLINIC_OR_DEPARTMENT_OTHER)
Admission: RE | Admit: 2017-11-17 | Discharge: 2017-11-17 | Disposition: A | Discharge status: Home / Self Care | Payer: Medicare HMO | Source: Ambulatory Visit | Attending: Family | Admitting: Family

## 2017-11-17 ENCOUNTER — Encounter: Payer: Self-pay | Admitting: Family

## 2017-11-17 ENCOUNTER — Ambulatory Visit (INDEPENDENT_AMBULATORY_CARE_PROVIDER_SITE_OTHER): Payer: Medicare HMO | Admitting: Family

## 2017-11-17 ENCOUNTER — Ambulatory Visit: Payer: Medicare HMO | Admitting: Cardiology

## 2017-11-17 VITALS — BP 114/75 | HR 61 | Ht 70.0 in | Wt 246.3 lb

## 2017-11-17 VITALS — BP 110/47 | HR 62 | Temp 98.2°F | Resp 16 | Ht 70.0 in | Wt 246.1 lb

## 2017-11-17 DIAGNOSIS — I10 Essential (primary) hypertension: Secondary | ICD-10-CM | POA: Diagnosis not present

## 2017-11-17 DIAGNOSIS — I714 Abdominal aortic aneurysm, without rupture, unspecified: Secondary | ICD-10-CM

## 2017-11-17 DIAGNOSIS — E7800 Pure hypercholesterolemia, unspecified: Secondary | ICD-10-CM | POA: Diagnosis not present

## 2017-11-17 DIAGNOSIS — N200 Calculus of kidney: Secondary | ICD-10-CM | POA: Diagnosis not present

## 2017-11-17 DIAGNOSIS — R310 Gross hematuria: Secondary | ICD-10-CM | POA: Diagnosis not present

## 2017-11-17 DIAGNOSIS — I2510 Atherosclerotic heart disease of native coronary artery without angina pectoris: Secondary | ICD-10-CM | POA: Diagnosis not present

## 2017-11-17 LAB — BASIC METABOLIC PANEL
BUN: 34 mg/dL — ABNORMAL HIGH (ref 6–23)
CHLORIDE: 106 meq/L (ref 96–112)
CO2: 28 meq/L (ref 19–32)
CREATININE: 1.46 mg/dL (ref 0.40–1.50)
Calcium: 9.7 mg/dL (ref 8.4–10.5)
GFR: 49.67 mL/min — ABNORMAL LOW (ref >60.00)
GLUCOSE: 123 mg/dL — AB (ref 70–99)
Potassium: 4.3 mEq/L (ref 3.5–5.1)
Sodium: 141 mEq/L (ref 135–145)

## 2017-11-17 NOTE — Patient Instructions (Signed)
Please complete lab work prior to leaving. Continue cipro. Avoid tizanidine while on cipro. We will contact you with your urine results once they are available.

## 2017-11-17 NOTE — Patient Instructions (Signed)
Medication Instructions:   STOP PLAVIX  Follow-Up:  Your physician wants you to follow-up in: ONE YEAR WITH DR CRENSHAW You will receive a reminder letter in the mail two months in advance. If you don't receive a letter, please call our office to schedule the follow-up appointment.   If you need a refill on your cardiac medications before your next appointment, please call your pharmacy.    

## 2017-11-17 NOTE — Progress Notes (Signed)
Subjective:    Patient ID: William Hendrix, male    DOB: 1941/06/21, 77 y.o.   MRN: [id number]  HPI  Pt is a 77 yr old male who presents today for follow up of his gross hematuria. Was seen on 11/15/17 and underwent CT which showed possible stone on the right.  He was started on empiric cipro, and urine culture was obtained. Culture is still pending. Reports generalized soreness in his back but th unusual right-sided soreness has resolved.  He reports no further hematuria.  He denies dysuria.   Review of Systems See HPI  Past Medical History:  Diagnosis Date  . AAA (abdominal aortic aneurysm) ([place]) 12/15/2014   a. Mild aneurysmal dilatation of the infrarenal abdominal aorta 3.2 cm - f/u due by 2019.  . Arthritis    "all over" (07/30/2016)  . CAD (coronary artery disease)    a. stent to LAD 2000. b. possible spasm by cath 2001. c. IVUS/PTCA/DES to mLAD 05/2013. d. PTCA/DES of dRCA into ostial rPDA 07/2013. e. PTCA of OM2, DES to Cx, DES to [facility] 07/2016.  Marland Kitchen Chronic bronchitis ([place])   . Chronic diastolic CHF (congestive heart failure) ([place])   . Chronic lower back pain   . Chronic neck pain   . CKD (chronic kidney disease), stage III ([place])   . Diabetic peripheral neuropathy ([place])   . Ejection fraction    55%, 07/2010, mild inferior hypo  . GERD (gastroesophageal reflux disease)   . Gout   . H/O diverticulitis of colon 01/31/2015  . H/O hiatal hernia   . History of blood transfusion ~ 10/1940   "had pneumonia"  . History of kidney stones   . HTN (hypertension)   . Hyperlipidemia   . Melanoma of forearm, left ([place]) 02/2011   with wide excision   . Myocardial infarction ([place])   . Nephrolithiasis   . Obesity   . OSA on CPAP     [name] since 2000  . Peripheral neuropathy    both feet  . Positive D-dimer    a. significant elevation ,hospital 07/2010, etiology unclear. b. D Dimer chronically > 20.  . Skin cancer   . Spinal stenosis    a. s/p surgical repair 2013  . Spinal  stenosis of lumbar region   . Type II diabetes mellitus ([place])   . Ventral hernia      Social History   Socioeconomic History  . Marital status: Married    Spouse name: Not on file  . Number of children: Not on file  . Years of education: Not on file  . Highest education level: Not on file  Social Needs  . Financial resource strain: Not on file  . Food insecurity - worry: Not on file  . Food insecurity - inability: Not on file  . Transportation needs - medical: Not on file  . Transportation needs - non-medical: Not on file  Occupational History  . Occupation: Painter  Tobacco Use  . Smoking status: Former Smoker    Packs/day: 3.00    Years: 30.00    Pack years: 90.00    Types: Cigarettes    Last attempt to quit: 09/17/1996    Years since quitting: 21.1  . Smokeless tobacco: Never Used  Substance and Sexual Activity  . Alcohol use: No  . Drug use: No  . Sexual activity: Not Currently    Birth control/protection: None  Other Topics Concern  . Not on file  Social History Narrative  Married and lives locally with his wife.  Sharyon Cable.    Past Surgical History:  Procedure Laterality Date  . ANTERIOR LAT LUMBAR FUSION  10/22/2017   Procedure: Lumbar two-three Lumbar three-four Lumbar four-five Anteriolateral lumbar interbody fusion with percutaneous pedicle screw fixation and infuse;  Surgeon: [name], MD;  Location: [place];  Service: Neurosurgery;;  . APPLICATION OF ROBOTIC ASSISTANCE FOR SPINAL PROCEDURE  10/22/2017   Procedure: APPLICATION OF ROBOTIC ASSISTANCE FOR SPINAL PROCEDURE;  Surgeon: [name], MD;  Location: [place];  Service: Neurosurgery;;  . BACK SURGERY    . CARDIAC CATHETERIZATION N/A 07/30/2016   Procedure: Left Heart Cath and Coronary Angiography;  Surgeon: Nelva Bush, MD;  Location: [place] CV LAB;  Service: Cardiovascular;  Laterality: N/A;  . CARDIAC CATHETERIZATION N/A 07/30/2016   Procedure: Coronary Stent Intervention;  Surgeon:  Nelva Bush, MD;  Location: [place] CV LAB;  Service: Cardiovascular;  Laterality: N/A;  Mid CFX and MID RCA  . CARDIAC CATHETERIZATION N/A 07/30/2016   Procedure: Coronary Balloon Angioplasty;  Surgeon: Nelva Bush, MD;  Location: [place] [facility];  Service: Cardiovascular;  Laterality: N/A;  OM 1  . CARPAL TUNNEL RELEASE Left   . CATARACT EXTRACTION W/ INTRAOCULAR LENS  IMPLANT, BILATERAL Bilateral ~ 2014  . CERVICAL DISC SURGERY  1990s  . CORONARY ANGIOPLASTY WITH STENT PLACEMENT  05/2013  . CORONARY ANGIOPLASTY WITH STENT PLACEMENT  07/26/2013   DES to RCA extending to PDA    . CORONARY ANGIOPLASTY WITH STENT PLACEMENT  07/30/2016  . CORONARY ANGIOPLASTY WITH STENT PLACEMENT  2000   CAD  . DENTAL SURGERY  04/2016   "got infected; had to dig it out"  . EYE SURGERY    . KNEE ARTHROSCOPY Right 1990's   rt  . LEFT AND RIGHT HEART CATHETERIZATION WITH CORONARY ANGIOGRAM N/A 07/26/2013   Procedure: LEFT AND RIGHT HEART CATHETERIZATION WITH CORONARY ANGIOGRAM;  Surgeon: [name], MD;  Location: [place] CATH LAB;  Service: Cardiovascular;  Laterality: N/A;  . LEFT HEART CATH AND CORONARY ANGIOGRAPHY N/A 02/26/2017   Procedure: Left Heart Cath and Coronary Angiography;  Surgeon: Sherren Mocha, MD;  Location: [place] CV [facility];  Service: Cardiovascular;  Laterality: N/A;  . LEFT HEART CATHETERIZATION WITH CORONARY ANGIOGRAM N/A 05/19/2013   Procedure: LEFT HEART CATHETERIZATION WITH CORONARY ANGIOGRAM;  Surgeon: [name], MD;  Location: [facility] C[facility] LAB;  Service: Cardiovascular;  Laterality: N/A;  . LUMBAR LAMINECTOMY/DECOMPRESSION MICRODISCECTOMY  08/30/2012   Procedure: LUMBAR LAMINECTOMY/DECOMPRESSION MICRODISCECTOMY 2 LEVELS;  Surgeon: [name], MD;  Location: [place] [facility]UR[facility];  Service: Neurosurgery;  Laterality: Bilateral;  Bilateral Lumbar three-four Lumbar four-five Laminotomies  . LUMBAR PERCUTANEOUS PEDICLE SCREW 1 LEVEL Bilateral 10/22/2017   Procedure: LUMBAR  PERCUTANEOUS PEDICLE SCREW 1 LEVEL;  Surgeon: [name], MD;  Location: [facility];  Service: Neurosurgery;  Laterality: Bilateral;  . LUMBAR SPINE SURGERY  04/07/2016   Dr. Ellene Route; "?ruptured disc"  . MELANOMA EXCISION Left 02/2011   forearm  . NASAL SINUS SURGERY  1970s   "cut windows in sinus pockets"  . PERCUTANEOUS CORONARY STENT INTERVENTION (PCI-S)  05/19/2013   Procedure: PERCUTANEOUS CORONARY STENT INTERVENTION (PCI-S);  Surgeon: [name], MD;  Location: [facility] CATH LAB;  Service: Cardiovascular;;  . ULNAR TUNNEL RELEASE Left 04/07/2016   Dr. Ellene Route    Family History  Problem Relation Age of Onset  . Cancer Mother        intestinal   . Heart disease Mother   . Cancer  Father        prostate  . Migraines Daughter   . Leukemia Sister   . Heart disease Sister   . Prostate cancer Unknown   . Kidney cancer Unknown   . Cancer Unknown        Bladder cancer  . Coronary artery disease Unknown   . Hypertension Sister   . Hypertension Brother   . Heart attack Neg Hx   . Stroke Neg Hx     Allergies  Allergen Reactions  . Levemir [Insulin Detemir] Swelling and Other (See Comments)    Patient had redness and swelling and tenderness at injection site.  . Codeine Other (See Comments)    Makes him "shakey", is OK with hydrocodone GI UPSET & TREMORS  . Lipitor [Atorvastatin] Other (See Comments)    Muscle aches; liver functions. Patient is currently taken  . Novolog Mix [Insulin Aspart Prot & Aspart] Other (See Comments)    Causes skin to swell/itch at injection site  . Other Other (See Comments)    Brilinta caused SOB    Current Outpatient Medications on File Prior to Visit  Medication Sig Dispense Refill  . acetaminophen (TYLENOL) 500 MG tablet Take 1,000 mg by mouth 2 (two) times daily.    Marland Kitchen albuterol (PROVENTIL HFA;VENTOLIN HFA) 108 (90 Base) MCG/ACT inhaler Inhale 2 puffs into the lungs every 6 (six) hours as needed for wheezing or shortness of breath. 1 Inhaler 0  .  allopurinol (ZYLOPRIM) 300 MG tablet TAKE 1 TABLET EVERY DAY (Patient taking differently: Take 300 mg by mouth every day) 90 tablet 1  . aspirin EC 81 MG tablet Take 81 mg by mouth daily.    Marland Kitchen atorvastatin (LIPITOR) 40 MG tablet Take 1 tablet (40 mg total) by mouth daily. 90 tablet 1  . B Complex Vitamins (B COMPLEX-B12 PO) Take 1 tablet by mouth at bedtime.    . BD INSULIN SYRINGE ULTRAFINE 31G X 5/16" 1 ML MISC USE  TO INJECT  INSULIN  FIVE  TIMES  DAILY AS INSTRUCTED 450 each 3  . benzonatate (TESSALON) 100 MG capsule Take 1 capsule (100 mg total) by mouth 3 (three) times daily as needed for cough. 21 capsule 0  . chlorpheniramine (CHLOR-TRIMETON) 4 MG tablet Take 4 mg by mouth daily.    . Cholecalciferol (VITAMIN D) 1000 UNITS capsule Take 1,000 Units by mouth at bedtime.     . ciprofloxacin (CIPRO) 500 MG tablet Take 1 tablet (500 mg total) by mouth 2 (two) times daily. 10 tablet 0  . clopidogrel (PLAVIX) 75 MG tablet Take 1 tablet (75 mg total) by mouth daily. 90 tablet 3  . fenofibrate 160 MG tablet Take 1 tablet (160 mg total) by mouth at bedtime. 90 tablet 1  . fluocinonide cream (LIDEX) 4.74 % Apply 1 application topically daily as needed (for rash).     . fluticasone (FLONASE) 50 MCG/ACT nasal spray Place 2 sprays into both nostrils daily. 16 g 1  . furosemide (LASIX) 40 MG tablet TAKE 1 AND 1/2 TABLETS EVERY DAY (Patient taking differently: Take 60 mg by mouth every day) 135 tablet 2  . HYDROcodone-acetaminophen (NORCO/VICODIN) 5-325 MG tablet Take 1-2 tablets by mouth every 4 (four) hours as needed for severe pain ((score 7 to 10)). 60 tablet 0  . insulin NPH Human (NOVOLIN N) 100 UNIT/ML injection Inject 45 units in am and 55 in the evening (Patient taking differently: Inject 45-50 Units into the skin See admin instructions. Inject 45 units  SQ in the morning and inject 50 units SQ at bedtime) 30 mL 3  . insulin regular (NOVOLIN R RELION) 100 units/mL injection Inject 0.25-0.3 mLs (25-30  Units total) into the skin 3 (three) times daily before meals. (Patient taking differently: Inject 35 Units into the skin 3 (three) times daily before meals. ) 70 mL 3  . isosorbide mononitrate (IMDUR) 30 MG 24 hr tablet Take 1 tablet (30 mg total) by mouth daily. 90 tablet 3  . methocarbamol (ROBAXIN) 500 MG tablet Take 1 tablet (500 mg total) by mouth every 6 (six) hours as needed for muscle spasms. 40 tablet 3  . metoprolol tartrate (LOPRESSOR) 50 MG tablet Take 1 tablet (50 mg total) by mouth 2 (two) times daily. 180 tablet 1  . mupirocin ointment (BACTROBAN) 2 % Apply to area thin film twice daily 22 g 0  . nitroGLYCERIN (NITROSTAT) 0.4 MG SL tablet Place 1 tablet (0.4 mg total) under the tongue every 5 (five) minutes as needed for chest pain. 25 tablet 3  . pantoprazole (PROTONIX) 20 MG tablet Take 1 tablet (20 mg total) by mouth daily. 90 tablet 3  . Probiotic Product (PROBIOTIC PO) Take 1 capsule by mouth daily.    . tamsulosin (FLOMAX) 0.4 MG CAPS capsule Take 1 capsule (0.4 mg total) by mouth daily. 90 capsule 1  . tiZANidine (ZANAFLEX) 4 MG capsule Take 1 capsule (4 mg total) by mouth 4 (four) times daily as needed for muscle spasms. Take 1 tablet every 6 hours as needed; not to exceed 3 doses in 24 hours. 40 capsule 2  . TRUE METRIX BLOOD GLUCOSE TEST test strip USE TO TEST BLOOD SUGAR THREE TIMES DAILY AS DIRECTED 300 each 3   No current facility-administered medications on file prior to visit.     BP (!) 110/47 (BP Location: Right Arm, Patient Position: Sitting, Cuff Size: Large)   Pulse 62   Temp 98.2 F (36.8 C) (Oral)   Resp 16   Ht 5\' 10"  (1.778 m)   Wt 246 lb 1.6 oz (111.6 kg)   SpO2 95%   BMI 35.31 kg/m       Objective:   Physical Exam  Constitutional: He is oriented to person, place, and time. He appears well-developed and well-nourished. No distress.  HENT:  Head: Normocephalic and atraumatic.  Cardiovascular: Normal rate and regular rhythm.  No murmur  heard. Pulmonary/Chest: Effort normal and breath sounds normal. No respiratory distress. He has no wheezes. He has no rales.  Abdominal:  Negative CVA tenderness , abdomen is soft.  Musculoskeletal: He exhibits no edema.  Neurological: He is alert and oriented to person, place, and time.  Skin: Skin is warm and dry.  Psychiatric: He has a normal mood and affect. His behavior is normal. Thought content normal.          Assessment & Plan:  Gross hematuria-this is resolved.  At time of visit urine culture was still pending.  Recommended referral to urology for further evaluation however he declines.  We did perform a KUB to evaluate the stone however radiology felt that due to the size of the stone it would not be visible on KUB.  Clinically his symptoms have resolved.  He is advised to follow-up with PCP.  Addendum:  After the office visit we did receive urine culture result and it showed that it was Cipro resistant.  He was changed to Keflex.  In addition he stated that his cardiologist had him discontinue Plavix.  He will need a follow-up urinalysis with micro at his follow-up appointment on 2/19 with his PCP if still positive for urine may need to revisit possibility of urology referral.

## 2017-11-18 LAB — URINE CULTURE
MICRO NUMBER:: 90147357
SPECIMEN QUALITY: ADEQUATE

## 2017-11-19 ENCOUNTER — Telehealth: Payer: Self-pay | Admitting: Family

## 2017-11-19 ENCOUNTER — Encounter: Payer: Self-pay | Admitting: Family

## 2017-11-19 MED ORDER — CEPHALEXIN 500 MG PO CAPS: 500.0000 mg | ORAL_CAPSULE | Freq: Two times a day (BID) | ORAL | 0 refills | Status: DC

## 2017-11-19 NOTE — Telephone Encounter (Signed)
Noted  

## 2017-11-19 NOTE — Telephone Encounter (Signed)
Notified pt's spouse and she voices understanding. Pt has appt with PCP Charlett Blake) on 10/30/17 and will repeat urine at that time. Pt's spouse states that pt saw Dr Stanford Breed and they have taken pt off of Plavix at this time.

## 2017-11-19 NOTE — Telephone Encounter (Signed)
Please contact patient and let him know that his urine culture did grow bacteria.  The bacteria that is growing is not sensitive to Cipro.  He should stop Cipro.  Instead I would like for him to start Keflex twice daily for the next 5 days.  I would also like for him to complete urinalysis with micro in 2 weeks to make sure that the blood has resolved.  Diagnosis microscopic hematuria.  I sent him a my chart message as well about his KUB.  KUB looked okay but the radiologist felt that they would not likely be able to see the small stone on KUB.  Clinically it seems like it has resolved but if he has any further abdominal pain or bleeding he should let us know because we will need to repeat CT.

## 2017-11-30 ENCOUNTER — Encounter: Payer: Self-pay | Admitting: Family Medicine

## 2017-11-30 ENCOUNTER — Ambulatory Visit (INDEPENDENT_AMBULATORY_CARE_PROVIDER_SITE_OTHER): Payer: Medicare HMO | Admitting: Family Medicine

## 2017-11-30 VITALS — BP 118/58 | HR 71 | Temp 98.1°F | Resp 18 | Wt 245.7 lb

## 2017-11-30 DIAGNOSIS — Z794 Long term (current) use of insulin: Secondary | ICD-10-CM

## 2017-11-30 DIAGNOSIS — M549 Dorsalgia, unspecified: Secondary | ICD-10-CM

## 2017-11-30 DIAGNOSIS — K5900 Constipation, unspecified: Secondary | ICD-10-CM

## 2017-11-30 DIAGNOSIS — N289 Disorder of kidney and ureter, unspecified: Secondary | ICD-10-CM

## 2017-11-30 DIAGNOSIS — E1159 Type 2 diabetes mellitus with other circulatory complications: Secondary | ICD-10-CM

## 2017-11-30 DIAGNOSIS — E1165 Type 2 diabetes mellitus with hyperglycemia: Secondary | ICD-10-CM

## 2017-11-30 DIAGNOSIS — I10 Essential (primary) hypertension: Secondary | ICD-10-CM | POA: Diagnosis not present

## 2017-11-30 DIAGNOSIS — IMO0002 Reserved for concepts with insufficient information to code with codable children: Secondary | ICD-10-CM

## 2017-11-30 DIAGNOSIS — D649 Anemia, unspecified: Secondary | ICD-10-CM

## 2017-11-30 NOTE — Patient Instructions (Signed)
Max Tylenol/Acetaminophen/APAP 3000 mg in 24 hours.  Lidocaine gel by [facility], [place] or [place]percre[place] Sacroiliac Joint Dysfunction Sacroiliac joint dysfunction is a condition that causes inflammation on one or both sides of the sacroiliac (SI) joint. The SI joint connects the lower part of the spine (sacrum) with the two upper portions of the pelvis (ilium). This condition causes deep aching or burning pain in the low back. In some cases, the pain may also spread into one or both buttocks or hips or spread down the legs. What are the causes? This condition may be caused by:  Pregnancy. During pregnancy, extra stress is put on the SI joints because the pelvis widens.  Injury, such as: ? Car accidents. ? Sport-related injuries. ? Work-related injuries.  Having one leg that is shorter than the other.  Conditions that affect the joints, such as: ? Rheumatoid arthritis. ? Gout. ? Psoriatic arthritis. ? Joint infection (septic arthritis).  Sometimes, the cause of SI joint dysfunction is not known. What are the signs or symptoms? Symptoms of this condition include:  Aching or burning pain in the lower back. The pain may also spread to other areas, such as: ? Buttocks. ? Groin. ? Thighs and legs.  Muscle spasms in or around the painful areas.  Increased pain when standing, walking, running, stair climbing, bending, or lifting.  How is this diagnosed? Your health care provider will do a physical exam and take your medical history. During the exam, the health care provider may move one or both of your legs to different positions to check for pain. Various tests may be done to help verify the diagnosis, including:  Imaging tests to look for other causes of pain. These may include: ? MRI. ? CT scan. ? Bone scan.  Diagnostic injection. A numbing medicine is injected into the SI joint using a needle. If the pain is temporarily improved or stopped after the injection, this can indicate  that SI joint dysfunction is the problem.  How is this treated? Treatment may vary depending on the cause and severity of your condition. Treatment options may include:  Applying ice or heat to the lower back area. This can help to reduce pain and muscle spasms.  Medicines to relieve pain or inflammation or to relax the muscles.  Wearing a back brace (sacroiliac brace) to help support the joint while your back is healing.  Physical therapy to increase muscle strength around the joint and flexibility at the joint. This may also involve learning proper body positions and ways of moving to relieve stress on the joint.  Direct manipulation of the SI joint.  Injections of steroid medicine into the joint in order to reduce pain and swelling.  Radiofrequency ablation to burn away nerves that are carrying pain messages from the joint.  Use of a device that provides electrical stimulation in order to reduce pain at the joint.  Surgery to put in screws and plates that limit or prevent joint motion. This is rare.  Follow these instructions at home:  Rest as needed. Limit your activities as directed by your health care provider.  Take medicines only as directed by your health care provider.  If directed, apply ice to the affected area: ? Put ice in a plastic bag. ? Place a towel between your skin and the bag. ? Leave the ice on for 20 minutes, 2-3 times per day.  Use a heating pad or a moist heat pack as directed by your health care provider.  Exercise as directed by your health care provider or physical therapist.  Keep all follow-up visits as directed by your health care provider. This is important. Contact a health care provider if:  Your pain is not controlled with medicine.  You have a fever.  You have increasingly severe pain. Get help right away if:  You have weakness, numbness, or tingling in your legs or feet.  You lose control of your bladder or bowel. This  information is not intended to replace advice given to you by your health care provider. Make sure you discuss any questions you have with your health care provider. Document Released: 12/25/2008 Document Revised: 03/05/2016 Document Reviewed: 06/05/2014 Elsevier Interactive Patient Education  [employer].

## 2017-11-30 NOTE — Progress Notes (Signed)
Subjective:  I acted as a Education administrator for Dr. Charlett Blake. [name], [place]  Patient ID: William Hendrix, male    DOB: June 23, 1941, 77 y.o.   MRN: [id number]  No chief complaint on file.   HPI  Patient is in today for a follow up and he is accompanied by his son. He continues to struggle with back pain but it is improving except with certain movements he will get a sudden sharp pain over his sacroiliac joint. No radicular symptoms or incontinence. No more renal stone pain or colick. Denies CP/palp/SOB/HA/congestion/fevers/GI or GU c/o. Taking meds as prescribed  Patient Care Team: [name], MD as PCP - General (Family Medicine) [name], MD (Neurosurgery) Lelon Perla, MD as Consulting Physician (Cardiology) Rutherford Guys, MD as Consulting Physician (Ophthalmology) [name], MD as Consulting Physician (Internal Medicine)   Past Medical History:  Diagnosis Date  . AAA (abdominal aortic aneurysm) ([place]) 12/15/2014   a. Mild aneurysmal dilatation of the infrarenal abdominal aorta 3.2 cm - f/u due by 2019.  . Arthritis    "all over" (07/30/2016)  . CAD (coronary artery disease)    a. stent to LAD 2000. b. possible spasm by cath 2001. c. IVUS/PTCA/DES to mLAD 05/2013. d. PTCA/DES of dRCA into ostial rPDA 07/2013. e. PTCA of OM2, DES to Cx, DES to [facility] 07/2016.  Marland Kitchen Chronic bronchitis ([place])   . Chronic diastolic CHF (congestive heart failure) ([place])   . Chronic lower back pain   . Chronic neck pain   . CKD (chronic kidney disease), stage III ([place])   . Diabetic peripheral neuropathy ([place])   . Ejection fraction    55%, 07/2010, mild inferior hypo  . GERD (gastroesophageal reflux disease)   . Gout   . H/O diverticulitis of colon 01/31/2015  . H/O hiatal hernia   . History of blood transfusion ~ 10/1940   "had pneumonia"  . History of kidney stones   . HTN (hypertension)   . Hyperlipidemia   . Melanoma of forearm, left ([place]) 02/2011   with wide excision   . Myocardial  infarction ([place])   . Nephrolithiasis   . Obesity   . OSA on CPAP     [name] since 2000  . Peripheral neuropathy    both feet  . Positive D-dimer    a. significant elevation ,hospital 07/2010, etiology unclear. b. D Dimer chronically > 20.  . Skin cancer   . Spinal stenosis    a. s/p surgical repair 2013  . Spinal stenosis of lumbar region   . Type II diabetes mellitus ([place])   . Ventral hernia     Past Surgical History:  Procedure Laterality Date  . ANTERIOR LAT LUMBAR FUSION  10/22/2017   Procedure: Lumbar two-three Lumbar three-four Lumbar four-five Anteriolateral lumbar interbody fusion with percutaneous pedicle screw fixation and infuse;  Surgeon: [name], MD;  Location: [place];  Service: Neurosurgery;;  . APPLICATION OF ROBOTIC ASSISTANCE FOR SPINAL PROCEDURE  10/22/2017   Procedure: APPLICATION OF ROBOTIC ASSISTANCE FOR SPINAL PROCEDURE;  Surgeon: [name], MD;  Location: [place];  Service: Neurosurgery;;  . BACK SURGERY    . CARDIAC CATHETERIZATION N/A 07/30/2016   Procedure: Left Heart Cath and Coronary Angiography;  Surgeon: Nelva Bush, MD;  Location: [place] CV LAB;  Service: Cardiovascular;  Laterality: N/A;  . CARDIAC CATHETERIZATION N/A 07/30/2016   Procedure: Coronary Stent Intervention;  Surgeon: Nelva Bush, MD;  Location: [place] CV LAB;  Service: Cardiovascular;  Laterality: N/A;  Mid CFX and MID RCA  . CARDIAC CATHETERIZATION N/A 07/30/2016   Procedure: Coronary Balloon Angioplasty;  Surgeon: Nelva Bush, MD;  Location: [place] CV LAB;  Service: Cardiovascular;  Laterality: N/A;  OM 1  . CARPAL TUNNEL RELEASE Left   . CATARACT EXTRACTION W/ INTRAOCULAR LENS  IMPLANT, BILATERAL Bilateral ~ 2014  . CERVICAL DISC SURGERY  1990s  . CORONARY ANGIOPLASTY WITH STENT PLACEMENT  05/2013  . CORONARY ANGIOPLASTY WITH STENT PLACEMENT  07/26/2013   DES to RCA extending to PDA    . CORONARY ANGIOPLASTY WITH STENT PLACEMENT  07/30/2016  .  CORONARY ANGIOPLASTY WITH STENT PLACEMENT  2000   CAD  . DENTAL SURGERY  04/2016   "got infected; had to dig it out"  . EYE SURGERY    . KNEE ARTHROSCOPY Right 1990's   rt  . LEFT AND RIGHT HEART CATHETERIZATION WITH CORONARY ANGIOGRAM N/A 07/26/2013   Procedure: LEFT AND RIGHT HEART CATHETERIZATION WITH CORONARY ANGIOGRAM;  Surgeon: [name], MD;  Location: [place] CATH LAB;  Service: Cardiovascular;  Laterality: N/A;  . LEFT HEART CATH AND CORONARY ANGIOGRAPHY N/A 02/26/2017   Procedure: Left Heart Cath and Coronary Angiography;  Surgeon: Sherren Mocha, MD;  Location: [place] CV LAB;  Service: Cardiovascular;  Laterality: N/A;  . LEFT HEART CATHETERIZATION WITH CORONARY ANGIOGRAM N/A 05/19/2013   Procedure: LEFT HEART CATHETERIZATION WITH CORONARY ANGIOGRAM;  Surgeon: [name], MD;  Location: [facility] CATH LAB;  Service: Cardiovascular;  Laterality: N/A;  . LUMBAR LAMINECTOMY/DECOMPRESSION MICRODISCECTOMY  08/30/2012   Procedure: LUMBAR LAMINECTOMY/DECOMPRESSION MICRODISCECTOMY 2 LEVELS;  Surgeon: [name], MD;  Location: [place] NEURO ORS;  Service: Neurosurgery;  Laterality: Bilateral;  Bilateral Lumbar three-four Lumbar four-five Laminotomies  . LUMBAR PERCUTANEOUS PEDICLE SCREW 1 LEVEL Bilateral 10/22/2017   Procedure: LUMBAR PERCUTANEOUS PEDICLE SCREW 1 LEVEL;  Surgeon: [name], MD;  Location: [place];  Service: Neurosurgery;  Laterality: Bilateral;  . LUMBAR SPINE SURGERY  04/07/2016   Dr. Ellene Route; "?ruptured disc"  . MELANOMA EXCISION Left 02/2011   forearm  . NASAL SINUS SURGERY  1970s   "cut windows in sinus pockets"  . PERCUTANEOUS CORONARY STENT INTERVENTION (PCI-S)  05/19/2013   Procedure: PERCUTANEOUS CORONARY STENT INTERVENTION (PCI-S);  Surgeon: [name], MD;  Location: [facility] CATH LAB;  Service: Cardiovascular;;  . ULNAR TUNNEL RELEASE Left 04/07/2016   Dr. Ellene Route    Family History  Problem Relation Age of Onset  . Cancer Mother        intestinal   . Heart  disease Mother   . Cancer Father        prostate  . Migraines Daughter   . Leukemia Sister   . Heart disease Sister   . Prostate cancer Unknown   . Kidney cancer Unknown   . Cancer Unknown        Bladder cancer  . Coronary artery disease Unknown   . Hypertension Sister   . Hypertension Brother   . Heart attack Neg Hx   . Stroke Neg Hx     Social History   Socioeconomic History  . Marital status: Married    Spouse name: Not on file  . Number of children: Not on file  . Years of education: Not on file  . Highest education level: Not on file  Social Needs  . Financial resource strain: Not on file  . Food insecurity - worry: Not on file  . Food insecurity - inability: Not on file  . Transportation needs -  medical: Not on file  . Transportation needs - non-medical: Not on file  Occupational History  . Occupation: Painter  Tobacco Use  . Smoking status: Former Smoker    Packs/day: 3.00    Years: 30.00    Pack years: 90.00    Types: Cigarettes    Last attempt to quit: 09/17/1996    Years since quitting: 21.2  . Smokeless tobacco: Never Used  Substance and Sexual Activity  . Alcohol use: No  . Drug use: No  . Sexual activity: Not Currently    Birth control/protection: None  Other Topics Concern  . Not on file  Social History Narrative   Married and lives locally with his wife.  Sharyon Cable.    Outpatient Medications Prior to Visit  Medication Sig Dispense Refill  . acetaminophen (TYLENOL) 500 MG tablet Take 1,000 mg by mouth 2 (two) times daily.    Marland Kitchen albuterol (PROVENTIL HFA;VENTOLIN HFA) 108 (90 Base) MCG/ACT inhaler Inhale 2 puffs into the lungs every 6 (six) hours as needed for wheezing or shortness of breath. 1 Inhaler 0  . allopurinol (ZYLOPRIM) 300 MG tablet TAKE 1 TABLET EVERY DAY (Patient taking differently: Take 300 mg by mouth every day) 90 tablet 1  . aspirin EC 81 MG tablet Take 81 mg by mouth daily.    Marland Kitchen atorvastatin (LIPITOR) 40 MG tablet Take 1 tablet (40  mg total) by mouth daily. 90 tablet 1  . B Complex Vitamins (B COMPLEX-B12 PO) Take 1 tablet by mouth at bedtime.    . BD INSULIN SYRINGE ULTRAFINE 31G X 5/16" 1 ML MISC USE  TO INJECT  INSULIN  FIVE  TIMES  DAILY AS INSTRUCTED 450 each 3  . chlorpheniramine (CHLOR-TRIMETON) 4 MG tablet Take 4 mg by mouth daily.    . Cholecalciferol (VITAMIN D) 1000 UNITS capsule Take 1,000 Units by mouth at bedtime.     . fenofibrate 160 MG tablet Take 1 tablet (160 mg total) by mouth at bedtime. 90 tablet 1  . fluocinonide cream (LIDEX) 0.16 % Apply 1 application topically daily as needed (for rash).     . fluticasone (FLONASE) 50 MCG/ACT nasal spray Place 2 sprays into both nostrils daily. 16 g 1  . furosemide (LASIX) 40 MG tablet TAKE 1 AND 1/2 TABLETS EVERY DAY (Patient taking differently: Take 60 mg by mouth every day) 135 tablet 2  . HYDROcodone-acetaminophen (NORCO/VICODIN) 5-325 MG tablet Take 1-2 tablets by mouth every 4 (four) hours as needed for severe pain ((score 7 to 10)). 60 tablet 0  . insulin NPH Human (NOVOLIN N) 100 UNIT/ML injection Inject 45 units in am and 55 in the evening (Patient taking differently: Inject 45-50 Units into the skin See admin instructions. Inject 45 units SQ in the morning and inject 50 units SQ at bedtime) 30 mL 3  . insulin regular (NOVOLIN R RELION) 100 units/mL injection Inject 0.25-0.3 mLs (25-30 Units total) into the skin 3 (three) times daily before meals. (Patient taking differently: Inject 35 Units into the skin 3 (three) times daily before meals. ) 70 mL 3  . isosorbide mononitrate (IMDUR) 30 MG 24 hr tablet Take 1 tablet (30 mg total) by mouth daily. 90 tablet 3  . metoprolol tartrate (LOPRESSOR) 50 MG tablet Take 1 tablet (50 mg total) by mouth 2 (two) times daily. 180 tablet 1  . mupirocin ointment (BACTROBAN) 2 % Apply to area thin film twice daily 22 g 0  . nitroGLYCERIN (NITROSTAT) 0.4 MG SL tablet Place 1  tablet (0.4 mg total) under the tongue every 5 (five)  minutes as needed for chest pain. 25 tablet 3  . pantoprazole (PROTONIX) 20 MG tablet Take 1 tablet (20 mg total) by mouth daily. 90 tablet 3  . Probiotic Product (PROBIOTIC PO) Take 1 capsule by mouth daily.    . tamsulosin (FLOMAX) 0.4 MG CAPS capsule Take 1 capsule (0.4 mg total) by mouth daily. 90 capsule 1  . tiZANidine (ZANAFLEX) 4 MG capsule Take 1 capsule (4 mg total) by mouth 4 (four) times daily as needed for muscle spasms. Take 1 tablet every 6 hours as needed; not to exceed 3 doses in 24 hours. 40 capsule 2  . TRUE METRIX BLOOD GLUCOSE TEST test strip USE TO TEST BLOOD SUGAR THREE TIMES DAILY AS DIRECTED 300 each 3  . benzonatate (TESSALON) 100 MG capsule Take 1 capsule (100 mg total) by mouth 3 (three) times daily as needed for cough. 21 capsule 0  . cephALEXin (KEFLEX) 500 MG capsule Take 1 capsule (500 mg total) by mouth 2 (two) times daily. 10 capsule 0  . ciprofloxacin (CIPRO) 500 MG tablet Take 1 tablet (500 mg total) by mouth 2 (two) times daily. 10 tablet 0  . methocarbamol (ROBAXIN) 500 MG tablet Take 1 tablet (500 mg total) by mouth every 6 (six) hours as needed for muscle spasms. 40 tablet 3   No facility-administered medications prior to visit.     Allergies  Allergen Reactions  . Levemir [Insulin Detemir] Swelling and Other (See Comments)    Patient had redness and swelling and tenderness at injection site.  . Codeine Other (See Comments)    Makes him "shakey", is OK with hydrocodone GI UPSET & TREMORS  . Lipitor [Atorvastatin] Other (See Comments)    Muscle aches; liver functions. Patient is currently taken  . Novolog Mix [Insulin Aspart Prot & Aspart] Other (See Comments)    Causes skin to swell/itch at injection site  . Other Other (See Comments)    Brilinta caused SOB    Review of Systems  Constitutional: Positive for malaise/fatigue. Negative for fever.  HENT: Negative for congestion.   Eyes: Negative for blurred vision.  Respiratory: Negative for cough  and shortness of breath.   Cardiovascular: Negative for chest pain, palpitations and leg swelling.  Gastrointestinal: Positive for abdominal pain and constipation. Negative for vomiting.  Musculoskeletal: Positive for back pain.  Skin: Negative for rash.  Neurological: Negative for loss of consciousness and headaches.       Objective:    Physical Exam  Constitutional: He is oriented to person, place, and time. He appears well-developed and well-nourished. No distress.  HENT:  Head: Normocephalic and atraumatic.  Eyes: Conjunctivae are normal.  Neck: Normal range of motion. No thyromegaly present.  Cardiovascular: Normal rate and regular rhythm.  Pulmonary/Chest: Effort normal and breath sounds normal. He has no wheezes.  Abdominal: Soft. Bowel sounds are normal. There is no tenderness.  Musculoskeletal: Normal range of motion. He exhibits no edema or deformity.  Back brace  Neurological: He is alert and oriented to person, place, and time.  Skin: Skin is warm and dry. He is not diaphoretic.  Psychiatric: He has a normal mood and affect.    BP (!) 118/58 (BP Location: Left Arm, Patient Position: Sitting, Cuff Size: Normal)   Pulse 71   Temp 98.1 F (36.7 C) (Oral)   Resp 18   Wt 245 lb 11.2 oz (111.4 kg)   SpO2 98%   BMI 35.25  kg/m  Wt Readings from Last 3 Encounters:  11/30/17 245 lb 11.2 oz (111.4 kg)  11/17/17 246 lb 4.8 oz (111.7 kg)  11/17/17 246 lb 1.6 oz (111.6 kg)   BP Readings from Last 3 Encounters:  11/30/17 (!) 118/58  11/17/17 114/75  11/17/17 (!) 110/47     Immunization History  Administered Date(s) Administered  . Influenza Split 08/08/2012  . Influenza Whole 08/08/2009, 07/12/2010  . Influenza, High Dose Seasonal PF 08/02/2014, 07/11/2015, 08/26/2017  . Influenza,inj,Quad PF,6+ Mos 07/27/2013, 08/04/2016  . Pneumococcal Conjugate-13 08/02/2014  . Pneumococcal Polysaccharide-23 07/28/2005    Health Maintenance  Topic Date Due  . TETANUS/TDAP   03/13/1959  . URINE MICROALBUMIN  08/03/2015  . FOOT EXAM  05/21/2016  . OPHTHALMOLOGY EXAM  09/29/2016  . HEMOGLOBIN A1C  03/09/2018  . INFLUENZA VACCINE  Completed  . PNA vac Low Risk Adult  Completed    Lab Results  Component Value Date   WBC 5.6 11/30/2017   HGB 12.6 (L) 11/30/2017   HCT 37.8 (L) 11/30/2017   PLT 226.0 11/30/2017   GLUCOSE 174 (H) 11/30/2017   CHOL 114 08/26/2017   TRIG 206.0 (H) 08/26/2017   HDL 24.60 (L) 08/26/2017   LDLDIRECT 63.0 08/26/2017   LDLCALC 31 04/09/2014   ALT 13 11/30/2017   AST 19 11/30/2017   NA 140 11/30/2017   K 4.3 11/30/2017   CL 104 11/30/2017   CREATININE 1.40 11/30/2017   BUN 28 (H) 11/30/2017   CO2 32 11/30/2017   TSH 2.15 08/26/2017   INR 1.1 02/24/2017   HGBA1C 6.3 09/09/2017   MICROALBUR 0.7 08/02/2014    Lab Results  Component Value Date   TSH 2.15 08/26/2017   Lab Results  Component Value Date   WBC 5.6 11/30/2017   HGB 12.6 (L) 11/30/2017   HCT 37.8 (L) 11/30/2017   MCV 94.3 11/30/2017   PLT 226.0 11/30/2017   Lab Results  Component Value Date   NA 140 11/30/2017   K 4.3 11/30/2017   CO2 32 11/30/2017   GLUCOSE 174 (H) 11/30/2017   BUN 28 (H) 11/30/2017   CREATININE 1.40 11/30/2017   BILITOT 0.5 11/30/2017   ALKPHOS 66 11/30/2017   AST 19 11/30/2017   ALT 13 11/30/2017   PROT 6.9 11/30/2017   ALBUMIN 4.0 11/30/2017   CALCIUM 10.3 11/30/2017   ANIONGAP 9 10/23/2017   GFR 52.13 (L) 11/30/2017   Lab Results  Component Value Date   CHOL 114 08/26/2017   Lab Results  Component Value Date   HDL 24.60 (L) 08/26/2017   Lab Results  Component Value Date   LDLCALC 31 04/09/2014   Lab Results  Component Value Date   TRIG 206.0 (H) 08/26/2017   Lab Results  Component Value Date   CHOLHDL 5 08/26/2017   Lab Results  Component Value Date   HGBA1C 6.3 09/09/2017         Assessment & Plan:   Problem List Items Addressed This Visit    Renal insufficiency   Relevant Orders    Comprehensive metabolic panel (Completed)   Essential hypertension    Well controlled, no changes to meds. Encouraged heart healthy diet such as the DASH diet and exercise as tolerated.       Uncontrolled type 2 diabetes mellitus with circulatory disorder, with long-term current use of insulin (HCC)     minimize simple carbs. Increase exercise as tolerated. Continue current meds      Back pain    Slowly  recovering from surgery and continues to need pain meds but they are helpful. Can consider adding Lidocaine topically      Anemia - Primary   Relevant Orders   CBC w/Diff (Completed)   Constipation    Encouraged increased hydration and fiber in diet. Daily probiotics. If bowels not moving can use MOM 2 tbls po in 4 oz of warm prune juice by mouth every 2-3 days. If no results then repeat in 4 hours with  Dulcolax suppository pr, may repeat again in 4 more hours as needed. Seek care if symptoms worsen. Consider daily Miralax and/or Dulcolax if symptoms persist.          I have discontinued Elyn Aquas. Remus's benzonatate, methocarbamol, ciprofloxacin, and cephALEXin. I am also having him maintain his Vitamin D, fluocinonide cream, aspirin EC, acetaminophen, nitroGLYCERIN, B Complex Vitamins (B COMPLEX-B12 PO), albuterol, pantoprazole, tiZANidine, isosorbide mononitrate, BD INSULIN SYRINGE ULTRAFINE, TRUE METRIX BLOOD GLUCOSE TEST, allopurinol, furosemide, fluticasone, insulin regular, insulin NPH Human, mupirocin ointment, chlorpheniramine, atorvastatin, fenofibrate, metoprolol tartrate, tamsulosin, Probiotic Product (PROBIOTIC PO), and HYDROcodone-acetaminophen.  No orders of the defined types were placed in this encounter.   CMA served as Education administrator during this visit. History, Physical and Plan performed by medical provider. Documentation and orders reviewed and attested to.  Penni Homans, MD

## 2017-12-01 LAB — COMPREHENSIVE METABOLIC PANEL
ALBUMIN: 4 g/dL (ref 3.5–5.2)
ALK PHOS: 66 U/L (ref 39–117)
ALT: 13 U/L (ref 0–53)
AST: 19 U/L (ref 0–37)
BUN: 28 mg/dL — AB (ref 6–23)
CO2: 32 mEq/L (ref 19–32)
CREATININE: 1.4 mg/dL (ref 0.40–1.50)
Calcium: 10.3 mg/dL (ref 8.4–10.5)
Chloride: 104 mEq/L (ref 96–112)
GFR: 52.13 mL/min — ABNORMAL LOW (ref >60.00)
Glucose, Bld: 174 mg/dL — ABNORMAL HIGH (ref 70–99)
Potassium: 4.3 mEq/L (ref 3.5–5.1)
SODIUM: 140 meq/L (ref 135–145)
TOTAL PROTEIN: 6.9 g/dL (ref 6.0–8.3)
Total Bilirubin: 0.5 mg/dL (ref 0.2–1.2)

## 2017-12-01 LAB — CBC WITH DIFFERENTIAL/PLATELET
BASOS ABS: 0 10*3/uL (ref 0.0–0.1)
Basophils Relative: 0.7 % (ref 0.0–3.0)
EOS ABS: 0.2 10*3/uL (ref 0.0–0.7)
EOS PCT: 3.2 % (ref 0.0–5.0)
HCT: 37.8 % — ABNORMAL LOW (ref 39.0–52.0)
HEMOGLOBIN: 12.6 g/dL — AB (ref 13.0–17.0)
Lymphocytes Relative: 36.5 % (ref 12.0–46.0)
Lymphs Abs: 2.1 10*3/uL (ref 0.7–4.0)
MCHC: 33.2 g/dL (ref 30.0–36.0)
MCV: 94.3 fl (ref 78.0–100.0)
MONO ABS: 0.4 10*3/uL (ref 0.1–1.0)
Monocytes Relative: 7 % (ref 3.0–12.0)
Neutro Abs: 3 10*3/uL (ref 1.4–7.7)
Neutrophils Relative %: 52.6 % (ref 43.0–77.0)
Platelets: 226 10*3/uL (ref 150.0–400.0)
RBC: 4.01 Mil/uL — ABNORMAL LOW (ref 4.22–5.81)
RDW: 14.4 % (ref 11.5–15.5)
WBC: 5.6 10*3/uL (ref 4.0–10.5)

## 2017-12-01 NOTE — Assessment & Plan Note (Signed)
minimize simple carbs. Increase exercise as tolerated. Continue current meds  

## 2017-12-01 NOTE — Assessment & Plan Note (Signed)
Well controlled, no changes to meds. Encouraged heart healthy diet such as the DASH diet and exercise as tolerated.  °

## 2017-12-01 NOTE — Assessment & Plan Note (Signed)
Slowly recovering from surgery and continues to need pain meds but they are helpful. Can consider adding Lidocaine topically

## 2017-12-01 NOTE — Assessment & Plan Note (Signed)
Encouraged increased hydration and fiber in diet. Daily probiotics. If bowels not moving can use MOM 2 tbls po in 4 oz of warm prune juice by mouth every 2-3 days. If no results then repeat in 4 hours with  Dulcolax suppository pr, may repeat again in 4 more hours as needed. Seek care if symptoms worsen. Consider daily Miralax and/or Dulcolax if symptoms persist.  

## 2017-12-09 IMAGING — NM NM PULMONARY VENT & PERF
16 series · 16 of 16 positions shown · non-contrast
Comparison: None.

CLINICAL DATA: Chest pain and shortness of Breath

EXAM:
NUCLEAR MEDICINE VENTILATION - PERFUSION LUNG SCAN
TECHNIQUE: Ventilation images were obtained in multiple projections using
inhaled aerosol Jc-88m DTPA. Perfusion images were obtained in
multiple projections after intravenous injection of Jc-88m MAA.
RADIOPHARMACEUTICALS:  32 mCi Wechnetium-00m DTPA aerosol inhalation
and 4.2 mCi Wechnetium-00m MAA IV

[Series 1: ant/post vent · 4.14mm/px · 1 of 1 slices shown (1 of 2)]
[im 1/1]
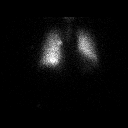

[Series 1: ant/post vent · 4.14mm/px · 1 of 1 slices shown (2 of 2)]
[im 1/1]
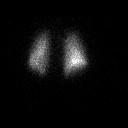

[Series 2: lao/rpo vent · 4.14mm/px · 1 of 1 slices shown (1 of 2)]
[im 1/1]
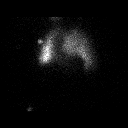

[Series 2: lao/rpo vent · 4.14mm/px · 1 of 1 slices shown (2 of 2)]
[im 1/1]
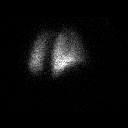

[Series 3: lpo/rao vent · 4.14mm/px · 1 of 1 slices shown (1 of 2)]
[im 1/1]
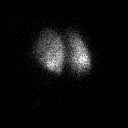

[Series 3: lpo/rao vent · 4.14mm/px · 1 of 1 slices shown (2 of 2)]
[im 1/1]
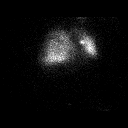

[Series 4: lt lat/rt lat vent · 4.14mm/px · 1 of 1 slices shown (1 of 2)]
[im 1/1  full-range]
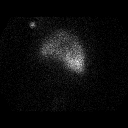

[Series 4: lt lat/rt lat vent · 4.14mm/px · 1 of 1 slices shown (2 of 2)]
[im 1/1  full-range]
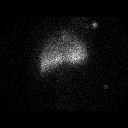

[Series 5: lt lat/rt lat perf · 4.14mm/px · 1 of 1 slices shown (1 of 2)]
[im 1/1]
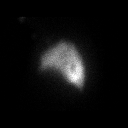

[Series 5: lt lat/rt lat perf · 4.14mm/px · 1 of 1 slices shown (2 of 2)]
[im 1/1]
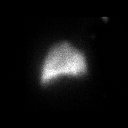

[Series 6: lpo/rao perf · 4.14mm/px · 1 of 1 slices shown (1 of 2)]
[im 1/1]
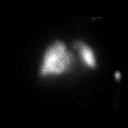

[Series 6: lpo/rao perf · 4.14mm/px · 1 of 1 slices shown (2 of 2)]
[im 1/1]
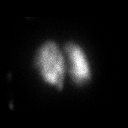

[Series 7: ant/post perf · 4.14mm/px · 1 of 1 slices shown (1 of 2)]
[im 1/1]
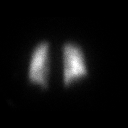

[Series 7: ant/post perf · 4.14mm/px · 1 of 1 slices shown (2 of 2)]
[im 1/1]
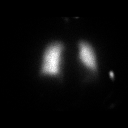

[Series 8: lao/rpo perf · 4.14mm/px · 1 of 1 slices shown (1 of 2)]
[im 1/1]
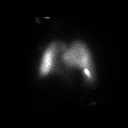

[Series 8: lao/rpo perf · 4.14mm/px · 1 of 1 slices shown (2 of 2)]
[im 1/1]
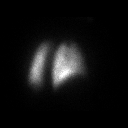

[16 of 16 positions shown; findings below may reference images not displayed]

FINDINGS: Ventilation: No focal ventilation defect.

Perfusion: No wedge shaped peripheral perfusion defects to suggest
acute pulmonary embolism.
IMPRESSION: No evidence of ventilation perfusion mismatch to suggest pulmonary
embolism.

## 2017-12-15 ENCOUNTER — Other Ambulatory Visit: Payer: Self-pay | Admitting: *Deleted

## 2017-12-15 ENCOUNTER — Encounter: Payer: Self-pay | Admitting: *Deleted

## 2017-12-15 DIAGNOSIS — I714 Abdominal aortic aneurysm, without rupture, unspecified: Secondary | ICD-10-CM

## 2017-12-16 ENCOUNTER — Telehealth: Payer: Self-pay | Admitting: Cardiology

## 2017-12-16 NOTE — Telephone Encounter (Signed)
Spoke with pt wife, the patient had a CT scan in February and we are able to see the size of the aortic aneurysm on that test. No [place] needed at this time.

## 2017-12-16 NOTE — Telephone Encounter (Signed)
New message    Patient spouse calling with questions about obtaining US. She feels this test may not be necessary.  Please call

## 2017-12-17 ENCOUNTER — Encounter: Payer: Self-pay | Admitting: Gastroenterology

## 2017-12-22 DIAGNOSIS — M47816 Spondylosis without myelopathy or radiculopathy, lumbar region: Secondary | ICD-10-CM | POA: Diagnosis not present

## 2017-12-28 ENCOUNTER — Other Ambulatory Visit (HOSPITAL_BASED_OUTPATIENT_CLINIC_OR_DEPARTMENT_OTHER): Payer: Self-pay | Admitting: Neurological Surgery

## 2017-12-28 DIAGNOSIS — M47816 Spondylosis without myelopathy or radiculopathy, lumbar region: Secondary | ICD-10-CM

## 2017-12-29 ENCOUNTER — Other Ambulatory Visit: Payer: Self-pay | Admitting: Family Medicine

## 2018-01-01 ENCOUNTER — Ambulatory Visit (HOSPITAL_BASED_OUTPATIENT_CLINIC_OR_DEPARTMENT_OTHER)
Admission: RE | Admit: 2018-01-01 | Discharge: 2018-01-01 | Disposition: A | Discharge status: Home / Self Care | Payer: Medicare HMO | Source: Ambulatory Visit | Attending: Neurological Surgery | Admitting: Neurological Surgery

## 2018-01-01 DIAGNOSIS — M4807 Spinal stenosis, lumbosacral region: Secondary | ICD-10-CM | POA: Diagnosis not present

## 2018-01-01 DIAGNOSIS — M47816 Spondylosis without myelopathy or radiculopathy, lumbar region: Secondary | ICD-10-CM

## 2018-01-01 DIAGNOSIS — M48061 Spinal stenosis, lumbar region without neurogenic claudication: Secondary | ICD-10-CM | POA: Insufficient documentation

## 2018-01-01 DIAGNOSIS — Z9889 Other specified postprocedural states: Secondary | ICD-10-CM | POA: Diagnosis not present

## 2018-01-01 DIAGNOSIS — M5116 Intervertebral disc disorders with radiculopathy, lumbar region: Secondary | ICD-10-CM | POA: Diagnosis not present

## 2018-01-01 MED ORDER — GADOBENATE DIMEGLUMINE 529 MG/ML IV SOLN
20.0000 mL | Freq: Once | INTRAVENOUS | Status: AC | PRN
  Administered 2018-01-01: 20 mL via INTRAVENOUS

## 2018-01-06 ENCOUNTER — Encounter: Payer: Self-pay | Admitting: Internal Medicine

## 2018-01-06 ENCOUNTER — Ambulatory Visit: Payer: Medicare HMO | Admitting: Internal Medicine

## 2018-01-06 VITALS — BP 134/74 | HR 65 | Ht 70.0 in | Wt 245.4 lb

## 2018-01-06 DIAGNOSIS — IMO0002 Reserved for concepts with insufficient information to code with codable children: Secondary | ICD-10-CM

## 2018-01-06 DIAGNOSIS — E1159 Type 2 diabetes mellitus with other circulatory complications: Secondary | ICD-10-CM | POA: Diagnosis not present

## 2018-01-06 DIAGNOSIS — Z794 Long term (current) use of insulin: Secondary | ICD-10-CM | POA: Diagnosis not present

## 2018-01-06 DIAGNOSIS — Z6837 Body mass index (BMI) 37.0-37.9, adult: Secondary | ICD-10-CM | POA: Diagnosis not present

## 2018-01-06 DIAGNOSIS — E785 Hyperlipidemia, unspecified: Secondary | ICD-10-CM

## 2018-01-06 DIAGNOSIS — E1165 Type 2 diabetes mellitus with hyperglycemia: Secondary | ICD-10-CM | POA: Diagnosis not present

## 2018-01-06 LAB — POCT GLYCOSYLATED HEMOGLOBIN (HGB A1C): Hemoglobin A1C: 5.7

## 2018-01-06 NOTE — Patient Instructions (Addendum)
Please continue:  Insulin Before breakfast Before lunch Before dinner  Regular 35 30-35 35  (if sugars <100, take 15 units)  NPH 45  50   Please return in 4 months with your sugar log.

## 2018-01-06 NOTE — Progress Notes (Signed)
Patient ID: William Hendrix, male   DOB: 1940-12-16, 77 y.o.   MRN: [id number]  HPI: William Hendrix is a 77 y.o.-year-old male, returning for f/u for DM2, dx 2009, insulin-dependent, uncontrolled, with complications (CAD s/p AMI 2017, CKD stage 3). Last visit 4 mo ago.  At the end of last year, he eliminated meat almost completely and his sugars were much better.  He also lost weight, approximately 15 pounds.  Sugars are much better.  He had back surgery in 10/2017, then developed a kidney stone.  Last hemoglobin A1c was: Lab Results  Component Value Date   HGBA1C 5.7 01/06/2018   HGBA1C 6.3 09/09/2017   HGBA1C 7.3 06/09/2017   He is now on: ReliOn insulin:  Insulin Before breakfast Before lunch Before dinner  Regular 35 30-35 35 (if sugars <100, take 15 units)  NPH 45  50   We cannot use Metformin 2/2 CKD. We stopped Januvia >> could not afford ($100/3 mo)  We cannot use Levemir >> developed a severe skin reaction We stopped Amaryl 4 mg.  He checks sugars 3 times a day-we reviewed his log: - am: 152-220 >> 80-158, 170 >> 84-164, 200 - Before lunch:  138-201 >> 87-173, 212 >> 69, 81-169, 214 - before dinner: 66, 72-157, 209 >> 60, 62-156, 201 - Before bedtime:  115-253, 325 >> 89-169 Lowest sugar was 66 >> 60; it is unclear at which level he has hypoglycemia awareness Highest sugar was 325 >> 212 >> 214.  Meals: - Breakfast: oatmeal + toast + tomatoes - Lunch: beans  - Dinner:home cooked veggies   + CKD, last BUN/creatinine was:  Lab Results  Component Value Date   BUN 28 (H) 11/30/2017   CREATININE 1.40 11/30/2017   Latest ACR:. Lab Results  Component Value Date   MICRALBCREAT 5.1 08/02/2014   MICRALBCREAT 0.7 02/23/2013   Latest GFR: Lab Results  Component Value Date   GFRAA 58 (L) 10/23/2017   GFRAA 53 (L) 10/20/2017   GFRAA 51 (L) 07/15/2017   GFRAA 51 (L) 02/26/2017   GFRAA 53 (L) 08/05/2016   GFRAA 48 (L) 08/02/2016   GFRAA 41 (L) 08/01/2016    GFRAA 58 (L) 07/31/2016   GFRAA 56 (L) 07/30/2016   GFRAA 57 (L) 12/15/2014   + HL: Last lipids - higher TG: Lab Results  Component Value Date   CHOL 114 08/26/2017   HDL 24.60 (L) 08/26/2017   LDLCALC 31 04/09/2014   LDLDIRECT 63.0 08/26/2017   TRIG 206.0 (H) 08/26/2017   CHOLHDL 5 08/26/2017  He is on Lipitor, fenofibrate, flax oil Pt's last eye exam was 09/2015: No DR.Had cataract sx in 2011. Denies numbness and tingling in his legs.  He had back surgery in 03/2016.  He was admitted 2x in 2017 for: CP/SOB >> AMI. He had 2 stents placed. He had a cardiac cath 02/2017 >> No new blockages.   PMH: Pt also also has a history of CAD, hypertension, hyperlipidemia, chronic kidney disease stage III, history of nephrolithiasis, obesity, hiatal hernia, obstructive sleep apnea, hyperglyceridemia, melanoma, spinal stenosis.  ROS: Constitutional: + weight loss, no fatigue, no subjective hyperthermia, no subjective hypothermia Eyes: no blurry vision, no xerophthalmia ENT: no sore throat, no nodules palpated in throat, no dysphagia, no odynophagia, no hoarseness Cardiovascular: no CP/no SOB/no palpitations/no leg swelling Respiratory: no cough/no SOB/no wheezing Gastrointestinal: no N/no V/no D/no C/no acid reflux Musculoskeletal: no muscle aches/+ joint aches Skin: no rashes, no hair loss Neurological: no tremors/no numbness/no tingling/no  dizziness  I reviewed pt's medications, allergies, PMH, social hx, family hx, and changes were documented in the history of present illness. Otherwise, unchanged from my initial visit note.  PE: BP 134/74   Pulse 65   Ht 5\' 10"  (1.778 m)   Wt 245 lb 6.4 oz (111.3 kg)   SpO2 96%   BMI 35.21 kg/m  Body mass index is 35.21 kg/m. Wt Readings from Last 3 Encounters:  01/06/18 245 lb 6.4 oz (111.3 kg)  11/30/17 245 lb 11.2 oz (111.4 kg)  11/17/17 246 lb 4.8 oz (111.7 kg)   Constitutional: overweight, in NAD Eyes: PERRLA, EOMI, no  exophthalmos ENT: moist mucous membranes, no thyromegaly, no cervical lymphadenopathy Cardiovascular: RRR, No MRG Respiratory: CTA B Gastrointestinal: abdomen soft, NT, ND, BS+ Musculoskeletal: no deformities, strength intact in all 4 Skin: moist, warm, no rashes Neurological: no tremor with outstretched hands, DTR normal in all 4  ASSESSMENT: 1. DM2, insulin-dependent, uncontrolled, with complications - CAD, s/p stents - He had stenting of distal RCA/PDA in 07/2013. Prior stenting of LAD in 04/2013. 2 new stents: PCI to left circumflex and the mid RCA on 07/30/2016. - AAA - CKD stage 3  2. HL  3.  Obesity class II BMI Classification:  < 18.5 underweight   18.5-24.9 normal weight   25.0-29.9 overweight   30.0-34.9 class I obesity   35.0-39.9 class II obesity   ? 40.0 class III obesity   PLAN:  1. Patient with history of uncontrolled diabetes, on basal-bolus insulin regimen, with significant improvement in his sugars at last visit, after he eliminated meat from his diet almost completely.  He also lost a significant amount of weight and his sugars were better.  We reduced his insulin doses slightly. - at this visit, sugars are mostly at goal, with few hyperglycemic spikes >> no changes are needed in his regimen. - discussed when to skip mealtime insulin doses - I advised him to: Patient Instructions   Please continue:  Insulin Before breakfast Before lunch Before dinner  Regular 35 30-35 35  (if sugars <100, take 15 units)  NPH 45  50   Please return in 4 months with your sugar log.   - today, HbA1c is 5.7% (better)  - continue checking sugars at different times of the day - check 1x a day, rotating checks - advised for yearly eye exams >> he is UTD - Return to clinic in 4 mo with sugar log     2. HL - Reviewed most recent lipid panel: Triglycerides high, but decreased to 200s due to changing his diet.,  -He continues on Lipitor and fenofibrate and also we added  flax oi no side effects from these medications.l.   3.  Obesity  - Did not lose a significant amount of weight since last visit.  - strongly encouraged him to stay on his plant-based diet  [name], MD PhD [facility]

## 2018-01-07 ENCOUNTER — Other Ambulatory Visit: Payer: Self-pay | Admitting: *Deleted

## 2018-01-07 ENCOUNTER — Encounter: Payer: Self-pay | Admitting: *Deleted

## 2018-01-07 DIAGNOSIS — I714 Abdominal aortic aneurysm, without rupture, unspecified: Secondary | ICD-10-CM

## 2018-01-11 ENCOUNTER — Ambulatory Visit: Payer: Medicare HMO | Admitting: Family Medicine

## 2018-01-18 ENCOUNTER — Telehealth: Payer: Self-pay | Admitting: Cardiology

## 2018-01-18 DIAGNOSIS — R072 Precordial pain: Secondary | ICD-10-CM

## 2018-01-18 MED ORDER — ISOSORBIDE MONONITRATE ER 30 MG PO TB24: 30.0000 mg | ORAL_TABLET | Freq: Every day | ORAL | 6 refills | Status: DC

## 2018-01-18 MED ORDER — ISOSORBIDE MONONITRATE ER 30 MG PO TB24: 30.0000 mg | ORAL_TABLET | Freq: Every day | ORAL | 3 refills | Status: DC

## 2018-01-18 NOTE — Telephone Encounter (Signed)
Returned call to patient's wife. She asked for [employer]. Explained she was in clinic, but if she prefers to speak with her only. Advised she will call her back when she is able

## 2018-01-18 NOTE — Telephone Encounter (Signed)
Mrs. Egnor is calling because she has some questions about his medication and some of the things they have done this year .  Please call

## 2018-01-18 NOTE — Telephone Encounter (Signed)
Spoke with pt wife, Refill sent to the pharmacy electronically.  ?

## 2018-01-24 ENCOUNTER — Encounter: Payer: Self-pay | Admitting: Family Medicine

## 2018-01-24 ENCOUNTER — Ambulatory Visit (INDEPENDENT_AMBULATORY_CARE_PROVIDER_SITE_OTHER): Payer: Medicare HMO | Admitting: Family Medicine

## 2018-01-24 VITALS — BP 120/62 | HR 69 | Temp 97.8°F | Resp 18 | Wt 246.4 lb

## 2018-01-24 DIAGNOSIS — IMO0002 Reserved for concepts with insufficient information to code with codable children: Secondary | ICD-10-CM

## 2018-01-24 DIAGNOSIS — E785 Hyperlipidemia, unspecified: Secondary | ICD-10-CM | POA: Diagnosis not present

## 2018-01-24 DIAGNOSIS — M549 Dorsalgia, unspecified: Secondary | ICD-10-CM | POA: Diagnosis not present

## 2018-01-24 DIAGNOSIS — E1165 Type 2 diabetes mellitus with hyperglycemia: Secondary | ICD-10-CM

## 2018-01-24 DIAGNOSIS — I10 Essential (primary) hypertension: Secondary | ICD-10-CM

## 2018-01-24 DIAGNOSIS — E1159 Type 2 diabetes mellitus with other circulatory complications: Secondary | ICD-10-CM

## 2018-01-24 DIAGNOSIS — Z794 Long term (current) use of insulin: Secondary | ICD-10-CM

## 2018-01-24 DIAGNOSIS — Z6837 Body mass index (BMI) 37.0-37.9, adult: Secondary | ICD-10-CM | POA: Diagnosis not present

## 2018-01-24 MED ORDER — FENOFIBRATE 160 MG PO TABS: 160.0000 mg | ORAL_TABLET | Freq: Every day | ORAL | 1 refills | Status: DC

## 2018-01-24 MED ORDER — ATORVASTATIN CALCIUM 40 MG PO TABS: 40.0000 mg | ORAL_TABLET | Freq: Every day | ORAL | 1 refills | Status: DC

## 2018-01-24 MED ORDER — METOPROLOL TARTRATE 50 MG PO TABS: 50.0000 mg | ORAL_TABLET | Freq: Two times a day (BID) | ORAL | 1 refills | Status: DC

## 2018-01-24 MED ORDER — TAMSULOSIN HCL 0.4 MG PO CAPS: 0.4000 mg | ORAL_CAPSULE | Freq: Every day | ORAL | 1 refills | Status: DC

## 2018-01-24 NOTE — Assessment & Plan Note (Addendum)
Underwent surgery with Dr Ellene Route about 3 months ago and he is healing well but slowly. He declined physical therapy after surgery but will consider it now. Will order PT here at [facility]

## 2018-01-24 NOTE — Progress Notes (Signed)
Subjective:  I acted as a Education administrator for Dr. Charlett Blake. William Hendrix, [place]  Patient ID: William Hendrix, male    DOB: May 20, 1941, 77 y.o.   MRN: [id number]  No chief complaint on file.   HPI  Patient is in today for a 6 week follow up. Patient c/o back pain and he is frustrated by how slowly he is improving. He had surgery with neurosurgery, Dr Roselee Culver, a couple of months ago but did not proceed with physical therapy at that time. He agrees he is willing to try now. No new concerns of fevers, incontinence or recent hospitalizations. Denies CP/palp/SOB/HA/congestion/fevers/GI or GU c/o. Taking meds as prescribed  Patient Care Team: [name], MD as PCP - General (Family Medicine) [name], MD (Neurosurgery) Lelon Perla, MD as Consulting Physician (Cardiology) Rutherford Guys, MD as Consulting Physician (Ophthalmology) [name], MD as Consulting Physician (Internal Medicine)   Past Medical History:  Diagnosis Date  . AAA (abdominal aortic aneurysm) ([place]) 12/15/2014   a. Mild aneurysmal dilatation of the infrarenal abdominal aorta 3.2 cm - f/u due by 2019.  . Arthritis    "all over" (07/30/2016)  . CAD (coronary artery disease)    a. stent to LAD 2000. b. possible spasm by cath 2001. c. IVUS/PTCA/DES to mLAD 05/2013. d. PTCA/DES of dRCA into ostial rPDA 07/2013. e. PTCA of OM2, DES to Cx, DES to [facility] 07/2016.  Marland Kitchen Chronic bronchitis ([place])   . Chronic diastolic CHF (congestive heart failure) ([place])   . Chronic lower back pain   . Chronic neck pain   . CKD (chronic kidney disease), stage III ([place])   . Diabetic peripheral neuropathy ([place])   . Ejection fraction    55%, 07/2010, mild inferior hypo  . GERD (gastroesophageal reflux disease)   . Gout   . H/O diverticulitis of colon 01/31/2015  . H/O hiatal hernia   . History of blood transfusion ~ 10/1940   "had pneumonia"  . History of kidney stones   . HTN (hypertension)   . Hyperlipidemia   . Melanoma of forearm, left ([place])  02/2011   with wide excision   . Myocardial infarction ([place])   . Nephrolithiasis   . Obesity   . OSA on CPAP     [name] since 2000  . Peripheral neuropathy    both feet  . Positive D-dimer    a. significant elevation ,hospital 07/2010, etiology unclear. b. D Dimer chronically > 20.  . Skin cancer   . Spinal stenosis    a. s/p surgical repair 2013  . Spinal stenosis of lumbar region   . Type II diabetes mellitus ([place])   . Ventral hernia     Past Surgical History:  Procedure Laterality Date  . ANTERIOR LAT LUMBAR FUSION  10/22/2017   Procedure: Lumbar two-three Lumbar three-four Lumbar four-five Anteriolateral lumbar interbody fusion with percutaneous pedicle screw fixation and infuse;  Surgeon: [name], MD;  Location: [place];  Service: Neurosurgery;;  . APPLICATION OF ROBOTIC ASSISTANCE FOR SPINAL PROCEDURE  10/22/2017   Procedure: APPLICATION OF ROBOTIC ASSISTANCE FOR SPINAL PROCEDURE;  Surgeon: [name], MD;  Location: [place];  Service: Neurosurgery;;  . BACK SURGERY    . CARDIAC CATHETERIZATION N/A 07/30/2016   Procedure: Left Heart Cath and Coronary Angiography;  Surgeon: Nelva Bush, MD;  Location: [place] CV LAB;  Service: Cardiovascular;  Laterality: N/A;  . CARDIAC CATHETERIZATION N/A 07/30/2016   Procedure: Coronary Stent Intervention;  Surgeon: Nelva Bush, MD;  Location:  [place] INVASIVE CV LAB;  Service: Cardiovascular;  Laterality: N/A;  Mid CFX and MID RCA  . CARDIAC CATHETERIZATION N/A 07/30/2016   Procedure: Coronary Balloon Angioplasty;  Surgeon: Nelva Bush, MD;  Location: [place] CV LAB;  Service: Cardiovascular;  Laterality: N/A;  OM 1  . CARPAL TUNNEL RELEASE Left   . CATARACT EXTRACTION W/ INTRAOCULAR LENS  IMPLANT, BILATERAL Bilateral ~ 2014  . CERVICAL DISC SURGERY  1990s  . CORONARY ANGIOPLASTY WITH STENT PLACEMENT  05/2013  . CORONARY ANGIOPLASTY WITH STENT PLACEMENT  07/26/2013   DES to RCA extending to PDA    . CORONARY  ANGIOPLASTY WITH STENT PLACEMENT  07/30/2016  . CORONARY ANGIOPLASTY WITH STENT PLACEMENT  2000   CAD  . DENTAL SURGERY  04/2016   "got infected; had to dig it out"  . EYE SURGERY    . KNEE ARTHROSCOPY Right 1990's   rt  . LEFT AND RIGHT HEART CATHETERIZATION WITH CORONARY ANGIOGRAM N/A 07/26/2013   Procedure: LEFT AND RIGHT HEART CATHETERIZATION WITH CORONARY ANGIOGRAM;  Surgeon: [name], MD;  Location: [place] CATH LAB;  Service: Cardiovascular;  Laterality: N/A;  . LEFT HEART CATH AND CORONARY ANGIOGRAPHY N/A 02/26/2017   Procedure: Left Heart Cath and Coronary Angiography;  Surgeon: Sherren Mocha, MD;  Location: [place] CV LAB;  Service: Cardiovascular;  Laterality: N/A;  . LEFT HEART CATHETERIZATION WITH CORONARY ANGIOGRAM N/A 05/19/2013   Procedure: LEFT HEART CATHETERIZATION WITH CORONARY ANGIOGRAM;  Surgeon: [name], MD;  Location: [facility] CATH LAB;  Service: Cardiovascular;  Laterality: N/A;  . LUMBAR LAMINECTOMY/DECOMPRESSION MICRODISCECTOMY  08/30/2012   Procedure: LUMBAR LAMINECTOMY/DECOMPRESSION MICRODISCECTOMY 2 LEVELS;  Surgeon: [name], MD;  Location: [place] NEURO ORS;  Service: Neurosurgery;  Laterality: Bilateral;  Bilateral Lumbar three-four Lumbar four-five Laminotomies  . LUMBAR PERCUTANEOUS PEDICLE SCREW 1 LEVEL Bilateral 10/22/2017   Procedure: LUMBAR PERCUTANEOUS PEDICLE SCREW 1 LEVEL;  Surgeon: [name], MD;  Location: [place];  Service: Neurosurgery;  Laterality: Bilateral;  . LUMBAR SPINE SURGERY  04/07/2016   Dr. Ellene Route; "?ruptured disc"  . MELANOMA EXCISION Left 02/2011   forearm  . NASAL SINUS SURGERY  1970s   "cut windows in sinus pockets"  . PERCUTANEOUS CORONARY STENT INTERVENTION (PCI-S)  05/19/2013   Procedure: PERCUTANEOUS CORONARY STENT INTERVENTION (PCI-S);  Surgeon: [name], MD;  Location[facility] CATH LAB;  Service: Cardiovascular;;  . ULNAR TUNNEL RELEASE Left 04/07/2016   Dr. Ellene Route    Family History  Problem Relation Age of Onset   . Cancer Mother        intestinal   . Heart disease Mother   . Cancer Father        prostate  . Migraines Daughter   . Leukemia Sister   . Heart disease Sister   . Prostate cancer Unknown   . Kidney cancer Unknown   . Cancer Unknown        Bladder cancer  . Coronary artery disease Unknown   . Hypertension Sister   . Hypertension Brother   . Heart attack Neg Hx   . Stroke Neg Hx     Social History   Socioeconomic History  . Marital status: Married    Spouse name: Not on file  . Number of children: Not on file  . Years of education: Not on file  . Highest education level: Not on file  Occupational History  . Occupation: [employer]  . Financial resource strain: Not on file  . Food insecurity:  Worry: Not on file    Inability: Not on file  . Transportation needs:    Medical: Not on file    Non-medical: Not on file  Tobacco Use  . Smoking status: Former Smoker    Packs/day: 3.00    Years: 30.00    Pack years: 90.00    Types: Cigarettes    Last attempt to quit: 09/17/1996    Years since quitting: 21.3  . Smokeless tobacco: Never Used  Substance and Sexual Activity  . Alcohol use: No  . Drug use: No  . Sexual activity: Not Currently    Birth control/protection: None  Lifestyle  . Physical activity:    Days per week: Not on file    Minutes per session: Not on file  . Stress: Not on file  Relationships  . Social connections:    Talks on phone: Not on file    Gets together: Not on file    Attends religious service: Not on file    Active member of club or organization: Not on file    Attends meetings of clubs or organizations: Not on file    Relationship status: Not on file  . Intimate partner violence:    Fear of current or ex partner: Not on file    Emotionally abused: Not on file    Physically abused: Not on file    Forced sexual activity: Not on file  Other Topics Concern  . Not on file  Social History Narrative   Married and lives  locally with his wife.  Sharyon Cable.    Outpatient Medications Prior to Visit  Medication Sig Dispense Refill  . acetaminophen (TYLENOL) 500 MG tablet Take 1,000 mg by mouth 2 (two) times daily.    Marland Kitchen albuterol (PROVENTIL HFA;VENTOLIN HFA) 108 (90 Base) MCG/ACT inhaler Inhale 2 puffs into the lungs every 6 (six) hours as needed for wheezing or shortness of breath. 1 Inhaler 0  . allopurinol (ZYLOPRIM) 300 MG tablet TAKE 1 TABLET EVERY DAY (Patient taking differently: Take 300 mg by mouth every day) 90 tablet 1  . aspirin EC 81 MG tablet Take 81 mg by mouth daily.    . B Complex Vitamins (B COMPLEX-B12 PO) Take 1 tablet by mouth at bedtime.    . BD INSULIN SYRINGE ULTRAFINE 31G X 5/16" 1 ML MISC USE  TO INJECT  INSULIN  FIVE  TIMES  DAILY AS INSTRUCTED 450 each 3  . chlorpheniramine (CHLOR-TRIMETON) 4 MG tablet Take 4 mg by mouth daily.    . Cholecalciferol (VITAMIN D) 1000 UNITS capsule Take 1,000 Units by mouth at bedtime.     . fluocinonide cream (LIDEX) 3.81 % Apply 1 application topically daily as needed (for rash).     . fluticasone (FLONASE) 50 MCG/ACT nasal spray Place 2 sprays into both nostrils daily. 16 g 1  . furosemide (LASIX) 40 MG tablet TAKE 1 AND 1/2 TABLETS EVERY DAY (Patient taking differently: Take 60 mg by mouth every day) 135 tablet 2  . HYDROcodone-acetaminophen (NORCO/VICODIN) 5-325 MG tablet Take 1-2 tablets by mouth every 4 (four) hours as needed for severe pain ((score 7 to 10)). 60 tablet 0  . insulin NPH Human (NOVOLIN N) 100 UNIT/ML injection Inject 45 units in am and 55 in the evening (Patient taking differently: Inject 45-50 Units into the skin See admin instructions. Inject 45 units SQ in the morning and inject 50 units SQ at bedtime) 30 mL 3  . insulin regular (NOVOLIN R RELION) 100 units/mL  injection Inject 0.25-0.3 mLs (25-30 Units total) into the skin 3 (three) times daily before meals. (Patient taking differently: Inject 35 Units into the skin 3 (three) times daily  before meals. ) 70 mL 3  . isosorbide mononitrate (IMDUR) 30 MG 24 hr tablet Take 1 tablet (30 mg total) by mouth daily. 90 tablet 3  . mupirocin ointment (BACTROBAN) 2 % Apply to area thin film twice daily 22 g 0  . nitroGLYCERIN (NITROSTAT) 0.4 MG SL tablet Place 1 tablet (0.4 mg total) under the tongue every 5 (five) minutes as needed for chest pain. 25 tablet 3  . pantoprazole (PROTONIX) 20 MG tablet TAKE 1 TABLET (20 MG TOTAL) BY MOUTH DAILY. 90 tablet 3  . Probiotic Product (PROBIOTIC PO) Take 1 capsule by mouth daily.    Marland Kitchen tiZANidine (ZANAFLEX) 4 MG capsule Take 1 capsule (4 mg total) by mouth 4 (four) times daily as needed for muscle spasms. Take 1 tablet every 6 hours as needed; not to exceed 3 doses in 24 hours. 40 capsule 2  . TRUE METRIX BLOOD GLUCOSE TEST test strip USE TO TEST BLOOD SUGAR THREE TIMES DAILY AS DIRECTED 300 each 3  . atorvastatin (LIPITOR) 40 MG tablet Take 1 tablet (40 mg total) by mouth daily. 90 tablet 1  . fenofibrate 160 MG tablet Take 1 tablet (160 mg total) by mouth at bedtime. 90 tablet 1  . metoprolol tartrate (LOPRESSOR) 50 MG tablet Take 1 tablet (50 mg total) by mouth 2 (two) times daily. 180 tablet 1  . tamsulosin (FLOMAX) 0.4 MG CAPS capsule Take 1 capsule (0.4 mg total) by mouth daily. 90 capsule 1   No facility-administered medications prior to visit.     Allergies  Allergen Reactions  . Levemir [Insulin Detemir] Swelling and Other (See Comments)    Patient had redness and swelling and tenderness at injection site.  . Codeine Other (See Comments)    Makes him "shakey", is OK with hydrocodone GI UPSET & TREMORS  . Lipitor [Atorvastatin] Other (See Comments)    Muscle aches; liver functions. Patient is currently taken  . Novolog Mix [Insulin Aspart Prot & Aspart] Other (See Comments)    Causes skin to swell/itch at injection site  . Other Other (See Comments)    Brilinta caused SOB    Review of Systems  Constitutional: Negative for fever and  malaise/fatigue.  HENT: Negative for congestion.   Eyes: Negative for blurred vision.  Respiratory: Negative for shortness of breath.   Cardiovascular: Negative for chest pain, palpitations and leg swelling.  Gastrointestinal: Negative for abdominal pain, blood in stool and nausea.  Genitourinary: Negative for dysuria and frequency.  Musculoskeletal: Positive for back pain. Negative for falls.  Skin: Negative for rash.  Neurological: Negative for dizziness, loss of consciousness and headaches.  Endo/Heme/Allergies: Negative for environmental allergies.  Psychiatric/Behavioral: Negative for depression. The patient is not nervous/anxious.        Objective:    Physical Exam  Constitutional: He is oriented to person, place, and time. He appears well-developed and well-nourished. No distress.  HENT:  Head: Normocephalic and atraumatic.  Nose: Nose normal.  Eyes: Right eye exhibits no discharge. Left eye exhibits no discharge.  Neck: Normal range of motion. Neck supple.  Cardiovascular: Normal rate and regular rhythm.  No murmur heard. Pulmonary/Chest: Effort normal and breath sounds normal.  Abdominal: Soft. Bowel sounds are normal. There is no tenderness.  Musculoskeletal: He exhibits no edema.  Neurological: He is alert and oriented to  person, place, and time.  Skin: Skin is warm and dry.  Psychiatric: He has a normal mood and affect.  Nursing note and vitals reviewed.   BP 120/62 (BP Location: Left Arm, Patient Position: Sitting, Cuff Size: Large)   Pulse 69   Temp 97.8 F (36.6 C) (Oral)   Resp 18   Wt 246 lb 6.4 oz (111.8 kg)   SpO2 96%   BMI 35.35 kg/m  Wt Readings from Last 3 Encounters:  01/24/18 246 lb 6.4 oz (111.8 kg)  01/06/18 245 lb 6.4 oz (111.3 kg)  11/30/17 245 lb 11.2 oz (111.4 kg)   BP Readings from Last 3 Encounters:  01/24/18 120/62  01/06/18 134/74  11/30/17 (!) 118/58     Immunization History  Administered Date(s) Administered  . Influenza  Split 08/08/2012  . Influenza Whole 08/08/2009, 07/12/2010  . Influenza, High Dose Seasonal PF 08/02/2014, 07/11/2015, 08/26/2017  . Influenza,inj,Quad PF,6+ Mos 07/27/2013, 08/04/2016  . Pneumococcal Conjugate-13 08/02/2014  . Pneumococcal Polysaccharide-23 07/28/2005    Health Maintenance  Topic Date Due  . TETANUS/TDAP  03/13/1959  . URINE MICROALBUMIN  08/03/2015  . FOOT EXAM  05/21/2016  . OPHTHALMOLOGY EXAM  09/29/2016  . INFLUENZA VACCINE  05/12/2018  . HEMOGLOBIN A1C  07/09/2018  . PNA vac Low Risk Adult  Completed    Lab Results  Component Value Date   WBC 6.9 01/24/2018   HGB 14.0 01/24/2018   HCT 41.6 01/24/2018   PLT 221.0 01/24/2018   GLUCOSE 57 (L) 01/24/2018   CHOL 135 01/24/2018   TRIG 286.0 (H) 01/24/2018   HDL 25.20 (L) 01/24/2018   LDLDIRECT 69.0 01/24/2018   LDLCALC 31 04/09/2014   ALT 20 01/24/2018   AST 21 01/24/2018   NA 143 01/24/2018   K 4.0 01/24/2018   CL 103 01/24/2018   CREATININE 1.56 (H) 01/24/2018   BUN 27 (H) 01/24/2018   CO2 31 01/24/2018   TSH 3.39 01/24/2018   INR 1.1 02/24/2017   HGBA1C 5.7 01/06/2018   MICROALBUR 0.7 08/02/2014    Lab Results  Component Value Date   TSH 3.39 01/24/2018   Lab Results  Component Value Date   WBC 6.9 01/24/2018   HGB 14.0 01/24/2018   HCT 41.6 01/24/2018   MCV 92.6 01/24/2018   PLT 221.0 01/24/2018   Lab Results  Component Value Date   NA 143 01/24/2018   K 4.0 01/24/2018   CO2 31 01/24/2018   GLUCOSE 57 (L) 01/24/2018   BUN 27 (H) 01/24/2018   CREATININE 1.56 (H) 01/24/2018   BILITOT 0.5 01/24/2018   ALKPHOS 61 01/24/2018   AST 21 01/24/2018   ALT 20 01/24/2018   PROT 7.9 01/24/2018   ALBUMIN 4.5 01/24/2018   CALCIUM 10.9 (H) 01/24/2018   ANIONGAP 9 10/23/2017   GFR 45.99 (L) 01/24/2018   Lab Results  Component Value Date   CHOL 135 01/24/2018   Lab Results  Component Value Date   HDL 25.20 (L) 01/24/2018   Lab Results  Component Value Date   LDLCALC 31 04/09/2014     Lab Results  Component Value Date   TRIG 286.0 (H) 01/24/2018   Lab Results  Component Value Date   CHOLHDL 5 01/24/2018   Lab Results  Component Value Date   HGBA1C 5.7 01/06/2018         Assessment & Plan:   Problem List Items Addressed This Visit    Hyperlipidemia - Primary   Relevant Medications   metoprolol tartrate (LOPRESSOR)  50 MG tablet   atorvastatin (LIPITOR) 40 MG tablet   fenofibrate 160 MG tablet   Other Relevant Orders   Lipid panel (Completed)   Essential hypertension    Well controlled, no changes to meds. Encouraged heart healthy diet such as the DASH diet and exercise as tolerated.       Relevant Medications   metoprolol tartrate (LOPRESSOR) 50 MG tablet   atorvastatin (LIPITOR) 40 MG tablet   fenofibrate 160 MG tablet   Other Relevant Orders   CBC (Completed)   Comprehensive metabolic panel (Completed)   TSH (Completed)   Uncontrolled type 2 diabetes mellitus with circulatory disorder, with long-term current use of insulin (HCC)    hgba1c acceptable, minimize simple carbs. Increase exercise as tolerated. Continue current meds      Relevant Medications   metoprolol tartrate (LOPRESSOR) 50 MG tablet   atorvastatin (LIPITOR) 40 MG tablet   fenofibrate 160 MG tablet   Obesity    decrease po intake and increase exercise as tolerated. Needs 7-8 hours of sleep nightly. Avoid trans fats, eat small, frequent meals every 4-5 hours with lean proteins, complex carbs and healthy fats. Minimize simple carbs      Back pain    Underwent surgery with Dr Ellene Route about 3 months ago and he is healing well but slowly. He declined physical therapy after surgery but will consider it now. Will order PT [facility] at [facility]      Relevant Orders   Ambulatory referral to Physical Therapy      I am having William Hendrix maintain his Vitamin D, fluocinonide cream, aspirin EC, acetaminophen, nitroGLYCERIN, B Complex Vitamins (B COMPLEX-B12 PO), albuterol, tiZANidine, BD  INSULIN SYRINGE ULTRAFINE, TRUE METRIX BLOOD GLUCOSE TEST, allopurinol, furosemide, fluticasone, insulin regular, insulin NPH Human, mupirocin ointment, chlorpheniramine, Probiotic Product (PROBIOTIC PO), HYDROcodone-acetaminophen, pantoprazole, isosorbide mononitrate, tamsulosin, metoprolol tartrate, atorvastatin, and fenofibrate.  Meds ordered this encounter  Medications  . tamsulosin (FLOMAX) 0.4 MG CAPS capsule    Sig: Take 1 capsule (0.4 mg total) by mouth daily.    Dispense:  90 capsule    Refill:  1  . metoprolol tartrate (LOPRESSOR) 50 MG tablet    Sig: Take 1 tablet (50 mg total) by mouth 2 (two) times daily.    Dispense:  180 tablet    Refill:  1  . atorvastatin (LIPITOR) 40 MG tablet    Sig: Take 1 tablet (40 mg total) by mouth daily.    Dispense:  90 tablet    Refill:  1  . fenofibrate 160 MG tablet    Sig: Take 1 tablet (160 mg total) by mouth at bedtime.    Dispense:  90 tablet    Refill:  1    CMA served as Education administrator during this visit. History, Physical and Plan performed by medical provider. Documentation and orders reviewed and attested to.  Penni Homans, MD

## 2018-01-24 NOTE — Assessment & Plan Note (Signed)
Well controlled, no changes to meds. Encouraged heart healthy diet such as the DASH diet and exercise as tolerated.  °

## 2018-01-24 NOTE — Patient Instructions (Signed)

## 2018-01-24 NOTE — Assessment & Plan Note (Signed)
decrease po intake and increase exercise as tolerated. Needs 7-8 hours of sleep nightly. Avoid trans fats, eat small, frequent meals every 4-5 hours with lean proteins, complex carbs and healthy fats. Minimize simple carbs 

## 2018-01-24 NOTE — Assessment & Plan Note (Signed)
hgba1c acceptable, minimize simple carbs. Increase exercise as tolerated. Continue current meds 

## 2018-01-25 ENCOUNTER — Other Ambulatory Visit (INDEPENDENT_AMBULATORY_CARE_PROVIDER_SITE_OTHER): Payer: Medicare HMO

## 2018-01-25 LAB — COMPREHENSIVE METABOLIC PANEL
ALBUMIN: 4.5 g/dL (ref 3.5–5.2)
ALK PHOS: 61 U/L (ref 39–117)
ALT: 20 U/L (ref 0–53)
AST: 21 U/L (ref 0–37)
BILIRUBIN TOTAL: 0.5 mg/dL (ref 0.2–1.2)
BUN: 27 mg/dL — AB (ref 6–23)
CALCIUM: 10.9 mg/dL — AB (ref 8.4–10.5)
CHLORIDE: 103 meq/L (ref 96–112)
CO2: 31 mEq/L (ref 19–32)
CREATININE: 1.56 mg/dL — AB (ref 0.40–1.50)
GFR: 45.99 mL/min — ABNORMAL LOW (ref >60.00)
Glucose, Bld: 57 mg/dL — ABNORMAL LOW (ref 70–99)
Potassium: 4 mEq/L (ref 3.5–5.1)
SODIUM: 143 meq/L (ref 135–145)
TOTAL PROTEIN: 7.9 g/dL (ref 6.0–8.3)

## 2018-01-25 LAB — CBC
HCT: 41.6 % (ref 39.0–52.0)
Hemoglobin: 14 g/dL (ref 13.0–17.0)
MCHC: 33.8 g/dL (ref 30.0–36.0)
MCV: 92.6 fl (ref 78.0–100.0)
Platelets: 221 10*3/uL (ref 150.0–400.0)
RBC: 4.49 Mil/uL (ref 4.22–5.81)
RDW: 14.4 % (ref 11.5–15.5)
WBC: 6.9 10*3/uL (ref 4.0–10.5)

## 2018-01-25 LAB — TSH: TSH: 3.39 u[IU]/mL (ref 0.35–4.50)

## 2018-01-25 LAB — VITAMIN D 25 HYDROXY (VIT D DEFICIENCY, FRACTURES): VITD: 40.18 ng/mL (ref 30.00–100.00)

## 2018-01-25 LAB — LIPID PANEL
CHOL/HDL RATIO: 5
CHOLESTEROL: 135 mg/dL (ref 0–200)
HDL: 25.2 mg/dL — ABNORMAL LOW (ref >39.00)
NonHDL: 110.18
TRIGLYCERIDES: 286 mg/dL — AB (ref 0.0–149.0)
VLDL: 57.2 mg/dL — ABNORMAL HIGH (ref 0.0–40.0)

## 2018-01-25 LAB — LDL CHOLESTEROL, DIRECT: LDL DIRECT: 69 mg/dL

## 2018-01-27 ENCOUNTER — Telehealth: Payer: Self-pay

## 2018-01-27 NOTE — Telephone Encounter (Signed)
Copied from [place] [id number]. Topic: Inquiry >> Jan 26, 2018  3:03 PM Oliver Pila B wrote: Reason for CRM: pt called and states that pt spoke w/ someone yesterday about his labs but pt is hard of hearing and did not understand what was discussed, call pt if needed to go over recent lab results    Yancey Flemings can you call this patient back. He said he did not hear you or understand you when you called.

## 2018-01-27 NOTE — Telephone Encounter (Signed)
Spoke with pt again to go over results

## 2018-02-15 ENCOUNTER — Ambulatory Visit: Payer: Medicare HMO | Admitting: Rehabilitative and Restorative Service Providers"

## 2018-02-15 ENCOUNTER — Encounter: Payer: Self-pay | Admitting: Rehabilitative and Restorative Service Providers"

## 2018-02-15 DIAGNOSIS — M5442 Lumbago with sciatica, left side: Secondary | ICD-10-CM | POA: Diagnosis not present

## 2018-02-15 DIAGNOSIS — M5441 Lumbago with sciatica, right side: Secondary | ICD-10-CM | POA: Diagnosis not present

## 2018-02-15 DIAGNOSIS — M6281 Muscle weakness (generalized): Secondary | ICD-10-CM | POA: Diagnosis not present

## 2018-02-15 DIAGNOSIS — G8929 Other chronic pain: Secondary | ICD-10-CM | POA: Diagnosis not present

## 2018-02-15 DIAGNOSIS — R29898 Other symptoms and signs involving the musculoskeletal system: Secondary | ICD-10-CM

## 2018-02-15 NOTE — Therapy (Signed)
[facility] [place], [place], [address] Phone: [phone]   Fax:  [phone]  Physical Therapy Evaluation  Patient Details  Name: William Hendrix MRN: [id number] Date of Birth: 05-Apr-1941 Referring Provider: Dr Penni Homans   Encounter Date: 02/15/2018  PT End of Session - 02/15/18 1013    Visit Number  1    Number of Visits  12    Date for PT Re-Evaluation  03/29/18    PT Start Time  6962    PT Stop Time  1112    PT Time Calculation (min)  58 min    Activity Tolerance  Patient tolerated treatment well       Past Medical History:  Diagnosis Date  . AAA (abdominal aortic aneurysm) ([place]) 12/15/2014   a. Mild aneurysmal dilatation of the infrarenal abdominal aorta 3.2 cm - f/u due by 2019.  . Arthritis    "all over" (07/30/2016)  . CAD (coronary artery disease)    a. stent to LAD 2000. b. possible spasm by cath 2001. c. IVUS/PTCA/DES to mLAD 05/2013. d. PTCA/DES of dRCA into ostial rPDA 07/2013. e. PTCA of OM2, DES to Cx, DES to [facility] 07/2016.  Marland Kitchen Chronic bronchitis ([place])   . Chronic diastolic CHF (congestive heart failure) ([place])   . Chronic lower back pain   . Chronic neck pain   . CKD (chronic kidney disease), stage III ([place])   . Diabetic peripheral neuropathy ([place])   . Ejection fraction    55%, 07/2010, mild inferior hypo  . GERD (gastroesophageal reflux disease)   . Gout   . H/O diverticulitis of colon 01/31/2015  . H/O hiatal hernia   . History of blood transfusion ~ 10/1940   "had pneumonia"  . History of kidney stones   . HTN (hypertension)   . Hyperlipidemia   . Melanoma of forearm, left ([place]) 02/2011   with wide excision   . Myocardial infarction ([place])   . Nephrolithiasis   . Obesity   . OSA on CPAP     [name] since 2000  . Peripheral neuropathy    both feet  . Positive D-dimer    a. significant elevation ,hospital 07/2010, etiology unclear. b. D Dimer chronically > 20.  . Skin  cancer   . Spinal stenosis    a. s/p surgical repair 2013  . Spinal stenosis of lumbar region   . Type II diabetes mellitus ([place])   . Ventral hernia     Past Surgical History:  Procedure Laterality Date  . ANTERIOR LAT LUMBAR FUSION  10/22/2017   Procedure: Lumbar two-three Lumbar three-four Lumbar four-five Anteriolateral lumbar interbody fusion with percutaneous pedicle screw fixation and infuse;  Surgeon: [name], MD;  Location: [facility];  Service: Neurosurgery;;  . APPLICATION OF ROBOTIC ASSISTANCE FOR SPINAL PROCEDURE  10/22/2017   Procedure: APPLICATION OF ROBOTIC ASSISTANCE FOR SPINAL PROCEDURE;  Surgeon: [name], MD;  Location: [facility];  Service: Neurosurgery;;  . BACK SURGERY    . CARDIAC CATHETERIZATION N/A 07/30/2016   Procedure: Left Heart Cath and Coronary Angiography;  Surgeon: Nelva Bush, MD;  Location: [facility] CV LAB;  Service: Cardiovascular;  Laterality: N/A;  . CARDIAC CATHETERIZATION N/A 07/30/2016   Procedure: Coronary Stent Intervention;  Surgeon: Nelva Bush, MD;  Location: [facility] CV LAB;  Service: Cardiovascular;  Laterality: N/A;  Mid CFX and MID RCA  . CARDIAC CATHETERIZATION N/A 07/30/2016   Procedure: Coronary Balloon Angioplasty;  Surgeon: Nelva Bush, MD;  Location: [facility] CV LAB;  Service: Cardiovascular;  Laterality: N/A;  OM 1  . CARPAL TUNNEL RELEASE Left   . CATARACT EXTRACTION W/ INTRAOCULAR LENS  IMPLANT, BILATERAL Bilateral ~ 2014  . CERVICAL DISC SURGERY  1990s  . CORONARY ANGIOPLASTY WITH STENT PLACEMENT  05/2013  . CORONARY ANGIOPLASTY WITH STENT PLACEMENT  07/26/2013   DES to RCA extending to PDA    . CORONARY ANGIOPLASTY WITH STENT PLACEMENT  07/30/2016  . CORONARY ANGIOPLASTY WITH STENT PLACEMENT  2000   CAD  . DENTAL SURGERY  04/2016   "got infected; had to dig it out"  . EYE SURGERY    . KNEE ARTHROSCOPY Right 1990's   rt  . LEFT AND RIGHT HEART CATHETERIZATION WITH CORONARY ANGIOGRAM N/A 07/26/2013    Procedure: LEFT AND RIGHT HEART CATHETERIZATION WITH CORONARY ANGIOGRAM;  Surgeon: [name], MD;  Location: [facility] CATH LAB;  Service: Cardiovascular;  Laterality: N/A;  . LEFT HEART CATH AND CORONARY ANGIOGRAPHY N/A 02/26/2017   Procedure: Left Heart Cath and Coronary Angiography;  Surgeon: Sherren Mocha, MD;  Location: [facility] CV LAB;  Service: Cardiovascular;  Laterality: N/A;  . LEFT HEART CATHETERIZATION WITH CORONARY ANGIOGRAM N/A 05/19/2013   Procedure: LEFT HEART CATHETERIZATION WITH CORONARY ANGIOGRAM;  Surgeon: [name], MD;  Location: [facility] CATH LAB;  Service: Cardiovascular;  Laterality: N/A;  . LUMBAR LAMINECTOMY/DECOMPRESSION MICRODISCECTOMY  08/30/2012   Procedure: LUMBAR LAMINECTOMY/DECOMPRESSION MICRODISCECTOMY 2 LEVELS;  Surgeon: [name], MD;  Location: [facility] NEURO ORS;  Service: Neurosurgery;  Laterality: Bilateral;  Bilateral Lumbar three-four Lumbar four-five Laminotomies  . LUMBAR PERCUTANEOUS PEDICLE SCREW 1 LEVEL Bilateral 10/22/2017   Procedure: LUMBAR PERCUTANEOUS PEDICLE SCREW 1 LEVEL;  Surgeon: [name], MD;  Location: [facility];  Service: Neurosurgery;  Laterality: Bilateral;  . LUMBAR SPINE SURGERY  04/07/2016   Dr. Ellene Route; "?ruptured disc"  . MELANOMA EXCISION Left 02/2011   forearm  . NASAL SINUS SURGERY  1970s   "cut windows in sinus pockets"  . PERCUTANEOUS CORONARY STENT INTERVENTION (PCI-S)  05/19/2013   Procedure: PERCUTANEOUS CORONARY STENT INTERVENTION (PCI-S);  Surgeon: [name], MD;  Location: [facility] CATH LAB;  Service: Cardiovascular;;  . ULNAR TUNNEL RELEASE Left 04/07/2016   Dr. Ellene Route    There were no vitals filed for this visit.   Subjective Assessment - 02/15/18 1019    Subjective  Patient reports that he has had LBP for ~ 10 years with symptoms gradually progressing. He was experiencing weakness and pain in his legs. Patient underwent lumbar fusion 10/22/17. He has continued pain and weakness following the surgery. He has pain in  the LB and Rt anterior hip into the Rt quad with cramps in the Rt LE to the calf.     Pertinent History  Heart disease with stent placement; AODM; cervical disc surgery - fusion 25 yrs ago; LBP for ~10 yrs with prior surgery 3-4 yrs ago for lumbar fusion; sleep apnea     How long can you sit comfortably?  not at all; (recliner 30 min)     How long can you stand comfortably?  not at all     How long can you walk comfortably?  5 min     Diagnostic tests  multiple tests MRI/xray     Patient Stated Goals  get rid of lumbar support and walker; get stronger in back and legs - go on his boat to go fishing     Currently in Pain?  Yes    Pain  Score  7     Pain Location  Back    Pain Orientation  Lower;Mid;Right    Pain Descriptors / Indicators  Throbbing    Pain Type  Chronic pain    Pain Radiating Towards  Rt posterior hip to anterior thigh to calf     Pain Onset  More than a month ago    Pain Frequency  Constant    Aggravating Factors   prolonged sitting, standing, walking; any bending     Pain Relieving Factors  meds; lying in bed with legs elevated/head elevated; has a TESN unit for home - helps some          [facility] PT Assessment - 02/15/18 0001      Assessment   Medical Diagnosis  Back pain     Referring Provider  Dr Penni Homans    Onset Date/Surgical Date  10/22/17    Hand Dominance  Right    Next MD Visit  to schedule     Prior Therapy  yes in the past for LB - chiropractic care with no improvement - none since surgery       Precautions   Precautions  None    Precaution Comments  lumbar support most of the time - walker uses outside house       Balance Screen   Has the patient fallen in the past 6 months  No    Has the patient had a decrease in activity level because of a fear of falling?   No    Is the patient reluctant to leave their home because of a fear of falling?   No      Prior Function   Level of Independence  Independent    Vocation  Retired    [employer] - self employed retired 5 yrs ago     Leisure  yard work; fishing; boating; sedentary       Observation/Other Assessments   Focus on Therapeutic Outcomes (FOTO)   65% limitation       Sensation   Additional Comments  tingling and numbness in the Rt LE       Posture/Postural Control   Posture Comments  head forward; shoulders rounded      AROM   Overall AROM   -- pt unable to lie in supine for full ROM assessment     Overall AROM Comments  limited ROM bilat hips throughout     Right/Left Hip  -- tight bilat in all planes       Strength   Overall Strength Comments  patient unable to lie supine; sidelying; prone for strength testing - assessed in sitting     Right Hip Flexion  3+/5    Right Hip Extension  4/5    Right Hip ABduction  4-/5    Left Hip Flexion  4-/5    Left Hip Extension  4-/5    Left Hip ABduction  4-/5    Right Knee Flexion  4-/5    Right Knee Extension  4-/5    Left Knee Flexion  4-/5    Left Knee Extension  4-/5      Flexibility   Hamstrings  limited knee extension in sitting - due to tight HS     Quadriceps  tight bilat      Transfers   Comments  transfers in/out of chair independently       Ambulation/Gait   Gait Comments  ambulates with rolling  walker with slowed gait pattern - equal length strides no significant limp noted       Balance   Balance Assessed  -- decreased balance unable to stand on one leg independently                 Objective measurements completed on examination: See above findings.      [facility] Adult PT Treatment/Exercise - 02/15/18 0001      Self-Care   Self-Care  -- initiated back care education - positioning        Lumbar Exercises: Stretches   Passive Hamstring Stretch  Right;Left;3 reps;30 seconds seated with LE extended; fwd flexed slightly at hips     Quad Stretch  Right;Left;2 reps;30 seconds sitting with knee flexed foot under table     Gastroc Stretch  Right;Left;3 reps;30  seconds    Gastroc Stretch Limitations  soleus stretch Rt/Lt 30 sec x 2              PT Education - 02/15/18 1329    Education provided  Yes    Education Details  HEP    Person(s) Educated  Patient;Spouse    Methods  Explanation;Demonstration;Tactile cues;Verbal cues;Handout    Comprehension  Verbalized understanding;Returned demonstration;Verbal cues required;Tactile cues required          PT Long Term Goals - 02/15/18 1352      PT LONG TERM GOAL #1   Title  Improve LE mobility and ROM to WFL's thus decreasing muscular tightness and cramping/pain 03/29/18    Time  6    Period  Weeks    Status  New      PT LONG TERM GOAL #2   Title  Increase strength bilat LE's to 4+/5 to 5-/5 throughout 03/29/18    Time  6    Period  Weeks    Status  New      PT LONG TERM GOAL #3   Title  Progress with ADL's and functional activities with plan to return to boating and fishing 03/29/18    Time  6    Period  Weeks    Status  New      PT LONG TERM GOAL #4   Title  Independent in HEP 03/19/18    Time  6    Period  Weeks    Status  New      PT LONG TERM GOAL #5   Title  Improve FOTO to </= 56% limitation 03/29/18    Time  6    Period  Weeks    Status  New             Plan - 02/15/18 1344    Clinical Impression Statement  Patient presents s/p multiple level lumbar fusion 10/22/17 with persistent pain and limited functional activity tolerance. Patient has limited trunk and LE ROM, strength and function. He is ambulatory with rolling walker outside of home and without walker in the home. Patient reports continued pain and weakness in LB and LE's. He continues to be in lumbar brace when upright. Patient will benefit from PT to address problems identified.     History and Personal Factors relevant to plan of care:  multiple surgeries; cardiac condition with stent placement x 5; AODM; sedentary lifestyle; cervical surgery; prior lumbar surgery     Clinical Presentation  Evolving     Clinical Presentation due to:  persistent pain in LB and LE's; weakness; limited functional activity level     Clinical Decision  Making  Moderate    Rehab Potential  Good    PT Frequency  2x / week    PT Duration  6 weeks    PT Treatment/Interventions  Patient/family education;ADLs/Self Care Home Management;Cryotherapy;Electrical Stimulation;Iontophoresis 4mg /ml Dexamethasone;Moist Heat;Ultrasound;Dry needling;Manual techniques;Neuromuscular re-education;Gait training;Functional mobility training;Therapeutic activities;Therapeutic exercise;Balance training    PT Next Visit Plan  review HEP; progress with stretching for hip flexors/quads; strengthening - focus on core stabilization and LE strengthening - functional activities     Consulted and Agree with Plan of Care  Patient       Patient will benefit from skilled therapeutic intervention in order to improve the following deficits and impairments:  Postural dysfunction, Improper body mechanics, Pain, Increased fascial restricitons, Increased muscle spasms, Decreased mobility, Decreased range of motion, Decreased strength, Decreased activity tolerance  Visit Diagnosis: Chronic bilateral low back pain with bilateral sciatica - Plan: PT plan of care cert/re-cert  Muscle weakness (generalized) - Plan: PT plan of care cert/re-cert  Other symptoms and signs involving the musculoskeletal system - Plan: PT plan of care cert/re-cert     Problem List Patient Active Problem List   Diagnosis Date Noted  . Anemia 11/07/2017  . Constipation 11/07/2017  . Lumbar stenosis with neurogenic claudication 10/22/2017  . Back pain 08/29/2017  . Right-sided low back pain with right-sided sciatica 11/22/2016  . Pressure injury of skin 08/03/2016  . Dyspnea 08/01/2016  . NSTEMI (non-ST elevated myocardial infarction) ([place]) 07/30/2016  . CAD in native artery   . Obesity   . Uncontrolled type 2 diabetes mellitus with circulatory disorder, with long-term  current use of insulin ([place]) 11/28/2015  . Lumbar radiculopathy 10/09/2015  . Neuropathy, diabetic ([place]) 05/22/2015  . H/O diverticulitis of colon 01/31/2015  . AAA (abdominal aortic aneurysm) ([place]) 01/01/2015  . Sinusitis 10/10/2014  . Hip pain, bilateral 07/05/2014  . Palpitations 02/27/2014  . Claudication of lower extremity ([place]) 10/25/2013  . BPH with obstruction/lower urinary tract symptoms 10/25/2013  . Hypertriglyceridemia 08/15/2012  . Melanoma ([place])   . Dizziness   . Renal insufficiency   . Coronary artery disease with exertional angina ([place])   . Hyperlipidemia   . Essential hypertension   . OSA (obstructive sleep apnea)   . Ejection fraction   . SOB (shortness of breath)   . PARESTHESIA 05/30/2009  . EDEMA 01/22/2009  . OTHER ABNORMAL BLOOD CHEMISTRY 04/25/2008  . Gout 10/21/2007  . DEPRESSION 10/21/2007  . VENTRAL HERNIA 10/21/2007  . HIATAL HERNIA 10/21/2007  . FATIGUE 10/21/2007  . SKIN CANCER, HX OF 10/21/2007  . NEPHROLITHIASIS, HX OF 10/21/2007    Equilla Que Nilda Simmer PT, MPH  02/15/2018, 1:57 PM  [name] [place], [place], [address] Phone: [phone]   Fax:  [phone]  Name: William Hendrix MRN: [id number] Date of Birth: December 22, 1940

## 2018-02-15 NOTE — Patient Instructions (Addendum)
Chair Sitting    Sit at edge of seat, spine straight, one leg extended. Put a hand on each thigh and bend forward from the hip, keeping spine straight. Allow hand on extended leg to reach toward toes. Support upper body with other arm. Hold __30_ seconds. Repeat _3__ times per session. Do __3-4_ sessions per day.  Gastroc Stretch    Stand with right foot back, leg straight, forward leg bent. Keeping heel on floor, turned slightly out, lean into wall until stretch is felt in calf. Hold _30___ seconds. Repeat __3__ times per set.  Do ___2-3_ sessions per day.   Achilles / Soleus, Standing    Stand, right foot behind, heel on floor and turned slightly out. Lower hips and bend knees. Hold __30_ seconds. Repeat _3__ times per session. Do _2-3__ sessions per day.    Quads / HF, Sitting in a chair     Lie near edge of bed, one leg bent, foot flat on bed. Other leg hanging over edge, relaxed, thigh resting entirely on bed. Bend hanging knee backward keeping thigh in contact with bed. Hold __30_ seconds.  Repeat _3_ times per session. Do _2-3__ sessions per day.

## 2018-02-16 ENCOUNTER — Other Ambulatory Visit: Payer: Self-pay | Admitting: *Deleted

## 2018-02-16 MED ORDER — NITROGLYCERIN 0.4 MG SL SUBL: 0.4000 mg | SUBLINGUAL_TABLET | SUBLINGUAL | 3 refills | Status: DC | PRN

## 2018-02-17 ENCOUNTER — Ambulatory Visit: Payer: Medicare HMO | Admitting: Rehabilitative and Restorative Service Providers"

## 2018-02-17 DIAGNOSIS — M5441 Lumbago with sciatica, right side: Secondary | ICD-10-CM | POA: Diagnosis not present

## 2018-02-17 DIAGNOSIS — M5442 Lumbago with sciatica, left side: Secondary | ICD-10-CM | POA: Diagnosis not present

## 2018-02-17 DIAGNOSIS — M6281 Muscle weakness (generalized): Secondary | ICD-10-CM

## 2018-02-17 DIAGNOSIS — R29898 Other symptoms and signs involving the musculoskeletal system: Secondary | ICD-10-CM | POA: Diagnosis not present

## 2018-02-17 DIAGNOSIS — G8929 Other chronic pain: Secondary | ICD-10-CM

## 2018-02-17 NOTE — Patient Instructions (Addendum)
Knee [facility] a chair for balance, slowly bend knees. Keep both feet on the floor. Core tight  Repeat ___10_ times. Do __1-2__ sessions per day.  Heel Raises    Stand with support. Tighten core and hold. With knees straight, raise heels off ground. Hold _1-2__ seconds. Repeat __10_ times. Do _1-2__ times a day.  Marching In-Place (touching step)     Standing straight, alternate bringing knees toward trunk. Core tight  10 reps Do _1-2__ sets. Do _1-2__ times per day.   Strengthening: Hip Abduction - no band    With tubing around right leg, other side toward anchor, extend leg out from side leading with heel. Repeat __10__ times per set. Do __1-2__ sets per session. Do __1-2__ sessions per day.    Self massage with massage stick and ball on wall (4 inch plastic ball)  biofreeze for sore muscles - check target - exercise equipment on amaz[employer] - "The Stick"

## 2018-02-17 NOTE — Therapy (Signed)
[facility] [place], [place], [address] Phone: [phone]   Fax:  [phone]  Physical Therapy Treatment  Patient Details  Name: William Hendrix MRN: [id number] Date of Birth: 02/15/1941 Referring Provider: Dr Penni Homans   Encounter Date: 02/17/2018  PT End of Session - 02/17/18 0940    Visit Number  2    Number of Visits  12    Date for PT Re-Evaluation  03/29/18    PT Start Time  0936    PT Stop Time  1025    PT Time Calculation (min)  49 min    Activity Tolerance  Patient tolerated treatment well       Past Medical History:  Diagnosis Date  . AAA (abdominal aortic aneurysm) ([place]) 12/15/2014   a. Mild aneurysmal dilatation of the infrarenal abdominal aorta 3.2 cm - f/u due by 2019.  . Arthritis    "all over" (07/30/2016)  . CAD (coronary artery disease)    a. stent to LAD 2000. b. possible spasm by cath 2001. c. IVUS/PTCA/DES to mLAD 05/2013. d. PTCA/DES of dRCA into ostial rPDA 07/2013. e. PTCA of OM2, DES to Cx, DES to [facility] 07/2016.  Marland Kitchen Chronic bronchitis ([place])   . Chronic diastolic CHF (congestive heart failure) ([place])   . Chronic lower back pain   . Chronic neck pain   . CKD (chronic kidney disease), stage III ([place])   . Diabetic peripheral neuropathy ([place])   . Ejection fraction    55%, 07/2010, mild inferior hypo  . GERD (gastroesophageal reflux disease)   . Gout   . H/O diverticulitis of colon 01/31/2015  . H/O hiatal hernia   . History of blood transfusion ~ 10/1940   "had pneumonia"  . History of kidney stones   . HTN (hypertension)   . Hyperlipidemia   . Melanoma of forearm, left ([place]) 02/2011   with wide excision   . Myocardial infarction ([place])   . Nephrolithiasis   . Obesity   . OSA on CPAP     [name] since 2000  . Peripheral neuropathy    both feet  . Positive D-dimer    a. significant elevation ,hospital 07/2010, etiology unclear. b. D Dimer chronically > 20.  . Skin cancer    . Spinal stenosis    a. s/p surgical repair 2013  . Spinal stenosis of lumbar region   . Type II diabetes mellitus ([place])   . Ventral hernia     Past Surgical History:  Procedure Laterality Date  . ANTERIOR LAT LUMBAR FUSION  10/22/2017   Procedure: Lumbar two-three Lumbar three-four Lumbar four-five Anteriolateral lumbar interbody fusion with percutaneous pedicle screw fixation and infuse;  Surgeon: [name], MD;  Location: [place];  Service: Neurosurgery;;  . APPLICATION OF ROBOTIC ASSISTANCE FOR SPINAL PROCEDURE  10/22/2017   Procedure: APPLICATION OF ROBOTIC ASSISTANCE FOR SPINAL PROCEDURE;  Surgeon: [name], MD;  Location: [place];  Service: Neurosurgery;;  . BACK SURGERY    . CARDIAC CATHETERIZATION N/A 07/30/2016   Procedure: Left Heart Cath and Coronary Angiography;  Surgeon: Nelva Bush, MD;  Location: [place] CV LAB;  Service: Cardiovascular;  Laterality: N/A;  . CARDIAC CATHETERIZATION N/A 07/30/2016   Procedure: Coronary Stent Intervention;  Surgeon: Nelva Bush, MD;  Location: [place] CV LAB;  Service: Cardiovascular;  Laterality: N/A;  Mid CFX and MID RCA  . CARDIAC CATHETERIZATION N/A 07/30/2016   Procedure: Coronary Balloon Angioplasty;  Surgeon: Nelva Bush, MD;  Location: [place] CV LAB;  Service: Cardiovascular;  Laterality: N/A;  OM 1  . CARPAL TUNNEL RELEASE Left   . CATARACT EXTRACTION W/ INTRAOCULAR LENS  IMPLANT, BILATERAL Bilateral ~ 2014  . CERVICAL DISC SURGERY  1990s  . CORONARY ANGIOPLASTY WITH STENT PLACEMENT  05/2013  . CORONARY ANGIOPLASTY WITH STENT PLACEMENT  07/26/2013   DES to RCA extending to PDA    . CORONARY ANGIOPLASTY WITH STENT PLACEMENT  07/30/2016  . CORONARY ANGIOPLASTY WITH STENT PLACEMENT  2000   CAD  . DENTAL SURGERY  04/2016   "got infected; had to dig it out"  . EYE SURGERY    . KNEE ARTHROSCOPY Right 1990's   rt  . LEFT AND RIGHT HEART CATHETERIZATION WITH CORONARY ANGIOGRAM N/A 07/26/2013    Procedure: LEFT AND RIGHT HEART CATHETERIZATION WITH CORONARY ANGIOGRAM;  Surgeon: [name], MD;  Location: [facility] CATH LAB;  Service: Cardiovascular;  Laterality: N/A;  . LEFT HEART CATH AND CORONARY ANGIOGRAPHY N/A 02/26/2017   Procedure: Left Heart Cath and Coronary Angiography;  Surgeon: Sherren Mocha, MD;  Location: [place] CV LAB;  Service: Cardiovascular;  Laterality: N/A;  . LEFT HEART CATHETERIZATION WITH CORONARY ANGIOGRAM N/A 05/19/2013   Procedure: LEFT HEART CATHETERIZATION WITH CORONARY ANGIOGRAM;  Surgeon: [name], MD;  Location: [facility] CATH LAB;  Service: Cardiovascular;  Laterality: N/A;  . LUMBAR LAMINECTOMY/DECOMPRESSION MICRODISCECTOMY  08/30/2012   Procedure: LUMBAR LAMINECTOMY/DECOMPRESSION MICRODISCECTOMY 2 LEVELS;  Surgeon: [name], MD;  Location: [place] NEURO ORS;  Service: Neurosurgery;  Laterality: Bilateral;  Bilateral Lumbar three-four Lumbar four-five Laminotomies  . LUMBAR PERCUTANEOUS PEDICLE SCREW 1 LEVEL Bilateral 10/22/2017   Procedure: LUMBAR PERCUTANEOUS PEDICLE SCREW 1 LEVEL;  Surgeon: [name], MD;  Location: [place];  Service: Neurosurgery;  Laterality: Bilateral;  . LUMBAR SPINE SURGERY  04/07/2016   Dr. Ellene Route; "?ruptured disc"  . MELANOMA EXCISION Left 02/2011   forearm  . NASAL SINUS SURGERY  1970s   "cut windows in sinus pockets"  . PERCUTANEOUS CORONARY STENT INTERVENTION (PCI-S)  05/19/2013   Procedure: PERCUTANEOUS CORONARY STENT INTERVENTION (PCI-S);  Surgeon: [name], MD;  Location: [facility] CATH LAB;  Service: Cardiovascular;;  . ULNAR TUNNEL RELEASE Left 04/07/2016   Dr. Ellene Route    There were no vitals filed for this visit.  Subjective Assessment - 02/17/18 0941    Subjective  Patient reports that he did his exercises twice after he left PT Tuesday and three times yesterday. He reports increased pain which he feels is from the exercises.     Currently in Pain?  Yes    Pain Score  7     Pain Location  Back    Pain Orientation   Lower;Mid;Right    Pain Descriptors / Indicators  Throbbing    Pain Type  Chronic pain                       [facility] Adult PT Treatment/Exercise - 02/17/18 0001      Therapeutic Activites    Therapeutic Activities  -- self massage w/ massage stick and ball       Lumbar Exercises: Stretches   Passive Hamstring Stretch  Right;Left;3 reps;30 seconds seated with LE extended; fwd flexed slightly at hips     Quad Stretch  Right;Left;2 reps;30 seconds sitting with knee flexed     Gastroc Stretch  Right;Left;3 reps;30 seconds      Lumbar Exercises: Aerobic   Nustep  L5 x 6 min LE  only       Lumbar Exercises: Standing   Heel Raises  10 reps    Functional Squats  10 reps small knee bends keeping knees behind toes     Other Standing Lumbar Exercises  heel raises x 10     Other Standing Lumbar Exercises  marching one knee up the the other 12 inch step core engaged              PT Education - 02/17/18 1024    Education provided  Yes    Education Details  HEP     Person(s) Educated  Patient    Methods  Explanation;Demonstration;Tactile cues;Verbal cues;Handout    Comprehension  Verbalized understanding;Returned demonstration;Verbal cues required;Tactile cues required          PT Long Term Goals - 02/15/18 1352      PT LONG TERM GOAL #1   Title  Improve LE mobility and ROM to WFL's thus decreasing muscular tightness and cramping/pain 03/29/18    Time  6    Period  Weeks    Status  New      PT LONG TERM GOAL #2   Title  Increase strength bilat LE's to 4+/5 to 5-/5 throughout 03/29/18    Time  6    Period  Weeks    Status  New      PT LONG TERM GOAL #3   Title  Progress with ADL's and functional activities with plan to return to boating and fishing 03/29/18    Time  6    Period  Weeks    Status  New      PT LONG TERM GOAL #4   Title  Independent in HEP 03/19/18    Time  6    Period  Weeks    Status  New      PT LONG TERM GOAL #5   Title  Improve FOTO to  </= 56% limitation 03/29/18    Time  6    Period  Weeks    Status  New            Plan - 02/17/18 0943    Clinical Impression Statement  Patient reports compliance with HEP since initial evaluation with some increase in pain he feels due to exercises - "sore'. Patient has limited tolerance for exercise but was able to add stepper and some core stabilization/LE strengthening.     Rehab Potential  Good    PT Frequency  2x / week    PT Duration  6 weeks    PT Treatment/Interventions  Patient/family education;ADLs/Self Care Home Management;Cryotherapy;Electrical Stimulation;Iontophoresis 4mg /ml Dexamethasone;Moist Heat;Ultrasound;Dry needling;Manual techniques;Neuromuscular re-education;Gait training;Functional mobility training;Therapeutic activities;Therapeutic exercise;Balance training    PT Next Visit Plan  review HEP; progress with stretching for hip flexors/quads; strengthening - focus on core stabilization and LE strengthening - functional activities     Consulted and Agree with Plan of Care  Patient       Patient will benefit from skilled therapeutic intervention in order to improve the following deficits and impairments:  Postural dysfunction, Improper body mechanics, Pain, Increased fascial restricitons, Increased muscle spasms, Decreased mobility, Decreased range of motion, Decreased strength, Decreased activity tolerance  Visit Diagnosis: Chronic bilateral low back pain with bilateral sciatica  Muscle weakness (generalized)  Other symptoms and signs involving the musculoskeletal system     Problem List Patient Active Problem List   Diagnosis Date Noted  . Anemia 11/07/2017  . Constipation 11/07/2017  . Lumbar stenosis with  neurogenic claudication 10/22/2017  . Back pain 08/29/2017  . Right-sided low back pain with right-sided sciatica 11/22/2016  . Pressure injury of skin 08/03/2016  . Dyspnea 08/01/2016  . NSTEMI (non-ST elevated myocardial infarction) ([place])  07/30/2016  . CAD in native artery   . Obesity   . Uncontrolled type 2 diabetes mellitus with circulatory disorder, with long-term current use of insulin ([place]) 11/28/2015  . Lumbar radiculopathy 10/09/2015  . Neuropathy, diabetic ([place]) 05/22/2015  . H/O diverticulitis of colon 01/31/2015  . AAA (abdominal aortic aneurysm) ([place]) 01/01/2015  . Sinusitis 10/10/2014  . Hip pain, bilateral 07/05/2014  . Palpitations 02/27/2014  . Claudication of lower extremity ([place]) 10/25/2013  . BPH with obstruction/lower urinary tract symptoms 10/25/2013  . Hypertriglyceridemia 08/15/2012  . Melanoma ([place])   . Dizziness   . Renal insufficiency   . Coronary artery disease with exertional angina ([place])   . Hyperlipidemia   . Essential hypertension   . OSA (obstructive sleep apnea)   . Ejection fraction   . SOB (shortness of breath)   . PARESTHESIA 05/30/2009  . EDEMA 01/22/2009  . OTHER ABNORMAL BLOOD CHEMISTRY 04/25/2008  . Gout 10/21/2007  . DEPRESSION 10/21/2007  . VENTRAL HERNIA 10/21/2007  . HIATAL HERNIA 10/21/2007  . FATIGUE 10/21/2007  . SKIN CANCER, HX OF 10/21/2007  . NEPHROLITHIASIS, HX OF 10/21/2007    Cher Franzoni Nilda Simmer PT, MPH  02/17/2018, 10:30 AM  [facility] [place], [place], [address] Phone: [phone]   Fax:  [phone]  Name: YVON MCCORD MRN: [id number] Date of Birth: 1940-11-04

## 2018-02-22 ENCOUNTER — Encounter: Payer: Self-pay | Admitting: Rehabilitative and Restorative Service Providers"

## 2018-02-22 ENCOUNTER — Ambulatory Visit: Payer: Medicare HMO | Admitting: Rehabilitative and Restorative Service Providers"

## 2018-02-22 DIAGNOSIS — M6281 Muscle weakness (generalized): Secondary | ICD-10-CM | POA: Diagnosis not present

## 2018-02-22 DIAGNOSIS — M5441 Lumbago with sciatica, right side: Secondary | ICD-10-CM

## 2018-02-22 DIAGNOSIS — R29898 Other symptoms and signs involving the musculoskeletal system: Secondary | ICD-10-CM

## 2018-02-22 DIAGNOSIS — G8929 Other chronic pain: Secondary | ICD-10-CM

## 2018-02-22 DIAGNOSIS — M5442 Lumbago with sciatica, left side: Secondary | ICD-10-CM

## 2018-02-22 NOTE — Patient Instructions (Addendum)
Standing at kitchen counter - stepping side steps to one side then the other - keep feet pointing straight ahead. 4-5 reps  Several feet - 10 feet - can use the hallway if the kitchen counter is too short      Walk heel to toe, then walk backwards ~ 10 feet - 4-5 reps        Sitting - pressing into the bed while sitting at the edge of the bed Hold 5 sec x 10 each hand  Keep core tight       Resisted External Rotation: in Neutral - Bilateral    Sit or stand, tubing in both hands, elbows at sides, bent to 90, palms up. Pinch shoulder blades together and rotate forearms out. Keep elbows at sides. Repeat __10 times per set. Do __2-3__ sets per session. Do __2__ sessions per day.

## 2018-02-22 NOTE — Therapy (Signed)
[facility] [place], [place], [address] Phone: [phone]   Fax:  [phone]  Physical Therapy Treatment  Patient Details  Name: William Hendrix MRN: [id number] Date of Birth: 1941/08/27 Referring Provider: Dr Penni Homans   Encounter Date: 02/22/2018  PT End of Session - 02/22/18 1026    Visit Number  3    Number of Visits  12    Date for PT Re-Evaluation  03/29/18    PT Start Time  1018    PT Stop Time  1100    PT Time Calculation (min)  42 min    Activity Tolerance  Patient tolerated treatment well       Past Medical History:  Diagnosis Date  . AAA (abdominal aortic aneurysm) ([place]) 12/15/2014   a. Mild aneurysmal dilatation of the infrarenal abdominal aorta 3.2 cm - f/u due by 2019.  . Arthritis    "all over" (07/30/2016)  . CAD (coronary artery disease)    a. stent to LAD 2000. b. possible spasm by cath 2001. c. IVUS/PTCA/DES to mLAD 05/2013. d. PTCA/DES of dRCA into ostial rPDA 07/2013. e. PTCA of OM2, DES to Cx, DES to [facility] 07/2016.  Marland Kitchen Chronic bronchitis ([place])   . Chronic diastolic CHF (congestive heart failure) ([place])   . Chronic lower back pain   . Chronic neck pain   . CKD (chronic kidney disease), stage III ([place])   . Diabetic peripheral neuropathy ([place])   . Ejection fraction    55%, 07/2010, mild inferior hypo  . GERD (gastroesophageal reflux disease)   . Gout   . H/O diverticulitis of colon 01/31/2015  . H/O hiatal hernia   . History of blood transfusion ~ 10/1940   "had pneumonia"  . History of kidney stones   . HTN (hypertension)   . Hyperlipidemia   . Melanoma of forearm, left ([place]) 02/2011   with wide excision   . Myocardial infarction ([place])   . Nephrolithiasis   . Obesity   . OSA on CPAP     [name] since 2000  . Peripheral neuropathy    both feet  . Positive D-dimer    a. significant elevation ,hospital 07/2010, etiology unclear. b. D Dimer chronically > 20.  . Skin  cancer   . Spinal stenosis    a. s/p surgical repair 2013  . Spinal stenosis of lumbar region   . Type II diabetes mellitus ([place])   . Ventral hernia     Past Surgical History:  Procedure Laterality Date  . ANTERIOR LAT LUMBAR FUSION  10/22/2017   Procedure: Lumbar two-three Lumbar three-four Lumbar four-five Anteriolateral lumbar interbody fusion with percutaneous pedicle screw fixation and infuse;  Surgeon: [name], MD;  Location: [facility];  Service: Neurosurgery;;  . APPLICATION OF ROBOTIC ASSISTANCE FOR SPINAL PROCEDURE  10/22/2017   Procedure: APPLICATION OF ROBOTIC ASSISTANCE FOR SPINAL PROCEDURE;  Surgeon: [name], MD;  Location: [place];  Service: Neurosurgery;;  . BACK SURGERY    . CARDIAC CATHETERIZATION N/A 07/30/2016   Procedure: Left Heart Cath and Coronary Angiography;  Surgeon: Nelva Bush, MD;  Location: [place] CV LAB;  Service: Cardiovascular;  Laterality: N/A;  . CARDIAC CATHETERIZATION N/A 07/30/2016   Procedure: Coronary Stent Intervention;  Surgeon: Nelva Bush, MD;  Location: [place] CV LAB;  Service: Cardiovascular;  Laterality: N/A;  Mid CFX and MID RCA  . CARDIAC CATHETERIZATION N/A 07/30/2016   Procedure: Coronary Balloon Angioplasty;  Surgeon: Nelva Bush, MD;  Location: [place] CV LAB;  Service: Cardiovascular;  Laterality: N/A;  OM 1  . CARPAL TUNNEL RELEASE Left   . CATARACT EXTRACTION W/ INTRAOCULAR LENS  IMPLANT, BILATERAL Bilateral ~ 2014  . CERVICAL DISC SURGERY  1990s  . CORONARY ANGIOPLASTY WITH STENT PLACEMENT  05/2013  . CORONARY ANGIOPLASTY WITH STENT PLACEMENT  07/26/2013   DES to RCA extending to PDA    . CORONARY ANGIOPLASTY WITH STENT PLACEMENT  07/30/2016  . CORONARY ANGIOPLASTY WITH STENT PLACEMENT  2000   CAD  . DENTAL SURGERY  04/2016   "got infected; had to dig it out"  . EYE SURGERY    . KNEE ARTHROSCOPY Right 1990's   rt  . LEFT AND RIGHT HEART CATHETERIZATION WITH CORONARY ANGIOGRAM N/A 07/26/2013    Procedure: LEFT AND RIGHT HEART CATHETERIZATION WITH CORONARY ANGIOGRAM;  Surgeon: [name], MD;  Location: [place] CATH LAB;  Service: Cardiovascular;  Laterality: N/A;  . LEFT HEART CATH AND CORONARY ANGIOGRAPHY N/A 02/26/2017   Procedure: Left Heart Cath and Coronary Angiography;  Surgeon: Sherren Mocha, MD;  Location: [place] CV LAB;  Service: Cardiovascular;  Laterality: N/A;  . LEFT HEART CATHETERIZATION WITH CORONARY ANGIOGRAM N/A 05/19/2013   Procedure: LEFT HEART CATHETERIZATION WITH CORONARY ANGIOGRAM;  Surgeon: [name], MD;  Location[facility] CATH LAB;  Service: Cardiovascular;  Laterality: N/A;  . LUMBAR LAMINECTOMY/DECOMPRESSION MICRODISCECTOMY  08/30/2012   Procedure: LUMBAR LAMINECTOMY/DECOMPRESSION MICRODISCECTOMY 2 LEVELS;  Surgeon: [name], MD;  Location: [place] NEURO ORS;  Service: Neurosurgery;  Laterality: Bilateral;  Bilateral Lumbar three-four Lumbar four-five Laminotomies  . LUMBAR PERCUTANEOUS PEDICLE SCREW 1 LEVEL Bilateral 10/22/2017   Procedure: LUMBAR PERCUTANEOUS PEDICLE SCREW 1 LEVEL;  Surgeon: [name], MD;  Location: [place];  Service: Neurosurgery;  Laterality: Bilateral;  . LUMBAR SPINE SURGERY  04/07/2016   Dr. Ellene Route; "?ruptured disc"  . MELANOMA EXCISION Left 02/2011   forearm  . NASAL SINUS SURGERY  1970s   "cut windows in sinus pockets"  . PERCUTANEOUS CORONARY STENT INTERVENTION (PCI-S)  05/19/2013   Procedure: PERCUTANEOUS CORONARY STENT INTERVENTION (PCI-S);  Surgeon: [name], MD;  Location: [facility] CATH LAB;  Service: Cardiovascular;;  . ULNAR TUNNEL RELEASE Left 04/07/2016   Dr. Ellene Route    There were no vitals filed for this visit.  Subjective Assessment - 02/22/18 1027    Subjective  Continued pain on a daily basis - increased pain today - was standing for several hours yesterday. Has been working on his exercises at home. Having cramps in the Rt leg today.     Currently in Pain?  Yes    Pain Score  7     Pain Location  Back     Pain Orientation  Lower;Mid;Right    Pain Descriptors / Indicators  Throbbing;Sore    Pain Type  Chronic pain         [facility] PT Assessment - 02/22/18 0001      Assessment   Medical Diagnosis  Back pain     Referring Provider  Dr Penni Homans    Onset Date/Surgical Date  10/22/17    Hand Dominance  Right    Next MD Visit  to schedule     Prior Therapy  yes in the past for LB - chiropractic care with no improvement - none since surgery       AROM   Overall AROM   -- pt unable to lie in supine for full ROM assessment     Overall AROM  Comments  limited ROM bilat hips throughout     Right/Left Hip  -- bilat tightness in all planes       Transfers   Comments  transfers in/out of chair without arms -  independently       Ambulation/Gait   Gait Comments  ambulates w/out assistive device - flexed forward at trunk/hips; good stride length; no significant limp                    [facility] Adult PT Treatment/Exercise - 02/22/18 0001      Lumbar Exercises: Stretches   Passive Hamstring Stretch  Right;Left;3 reps;30 seconds seated with LE extended; fwd flexed slightly at hips     Hip Flexor Stretch  Right;Left;3 reps;30 seconds sitting at edge of chair     Quad Stretch  Right;Left;2 reps;30 seconds sitting with knee flexed       Lumbar Exercises: Aerobic   Nustep  L5 x 6 min LE only       Lumbar Exercises: Standing   Other Standing Lumbar Exercises  side steps Rt/Lt 10 ft x 5 each; heel toe the backwards walking x 10 ft x 4 each       Lumbar Exercises: Seated   Other Seated Lumbar Exercises  pressing UE into large red ball 5 sec x 10 reps core engaged     Other Seated Lumbar Exercises  scapular retraction 10 x 2 sets core engaged  green TB              PT Education - 02/22/18 1054    Education provided  Yes    Education Details  HEP     Person(s) Educated  Patient    Methods  Explanation;Demonstration;Tactile cues;Verbal cues;Handout    Comprehension  Verbalized  understanding;Returned demonstration;Verbal cues required;Tactile cues required          PT Long Term Goals - 02/22/18 1026      PT LONG TERM GOAL #1   Title  Improve LE mobility and ROM to WFL's thus decreasing muscular tightness and cramping/pain 03/29/18    Time  6    Period  Weeks    Status  On-going      PT LONG TERM GOAL #2   Title  Increase strength bilat LE's to 4+/5 to 5-/5 throughout 03/29/18    Time  6    Period  Weeks    Status  On-going      PT LONG TERM GOAL #3   Title  Progress with ADL's and functional activities with plan to return to boating and fishing 03/29/18    Time  6    Period  Weeks    Status  On-going      PT LONG TERM GOAL #4   Title  Independent in HEP 03/19/18    Time  6    Period  Weeks    Status  On-going      PT LONG TERM GOAL #5   Title  Improve FOTO to </= 56% limitation 03/29/18    Time  6    Period  Weeks    Status  On-going            Plan - 02/22/18 1204    Clinical Impression Statement  Patient reports increased pain in Rt LB to Rt hip/LE - was standing for increased periods of time yesterday and also did his exercises three times. Has had some cramping and pain in the Rt LE this morning. Patient  added standing balance activities as wellas some sitting core work with [place]. Patient tolerated exercise well with switching from standing to sitting activities. Limited exercise tolerance.     Rehab Potential  Good    PT Frequency  2x / week    PT Duration  6 weeks    PT Treatment/Interventions  Patient/family education;ADLs/Self Care Home Management;Cryotherapy;Electrical Stimulation;Iontophoresis 4mg /ml Dexamethasone;Moist Heat;Ultrasound;Dry needling;Manual techniques;Neuromuscular re-education;Gait training;Functional mobility training;Therapeutic activities;Therapeutic exercise;Balance training    PT Next Visit Plan  review stretching for hip flexors/quads in sitting;  focus on core stabilization and LE strengthening - functional  activities difficulty lying down - may try to lie down for exercise and stretching at next visit     Consulted and Agree with Plan of Care  Patient       Patient will benefit from skilled therapeutic intervention in order to improve the following deficits and impairments:  Postural dysfunction, Improper body mechanics, Pain, Increased fascial restricitons, Increased muscle spasms, Decreased mobility, Decreased range of motion, Decreased strength, Decreased activity tolerance  Visit Diagnosis: Chronic bilateral low back pain with bilateral sciatica  Muscle weakness (generalized)  Other symptoms and signs involving the musculoskeletal system     Problem List Patient Active Problem List   Diagnosis Date Noted  . Anemia 11/07/2017  . Constipation 11/07/2017  . Lumbar stenosis with neurogenic claudication 10/22/2017  . Back pain 08/29/2017  . Right-sided low back pain with right-sided sciatica 11/22/2016  . Pressure injury of skin 08/03/2016  . Dyspnea 08/01/2016  . NSTEMI (non-ST elevated myocardial infarction) ([place]) 07/30/2016  . CAD in native artery   . Obesity   . Uncontrolled type 2 diabetes mellitus with circulatory disorder, with long-term current use of insulin ([place]) 11/28/2015  . Lumbar radiculopathy 10/09/2015  . Neuropathy, diabetic ([place]) 05/22/2015  . H/O diverticulitis of colon 01/31/2015  . AAA (abdominal aortic aneurysm) ([place]) 01/01/2015  . Sinusitis 10/10/2014  . Hip pain, bilateral 07/05/2014  . Palpitations 02/27/2014  . Claudication of lower extremity ([place]) 10/25/2013  . BPH with obstruction/lower urinary tract symptoms 10/25/2013  . Hypertriglyceridemia 08/15/2012  . Melanoma ([place])   . Dizziness   . Renal insufficiency   . Coronary artery disease with exertional angina ([place])   . Hyperlipidemia   . Essential hypertension   . OSA (obstructive sleep apnea)   . Ejection fraction   . SOB (shortness of breath)   . PARESTHESIA 05/30/2009  . EDEMA 01/22/2009   . OTHER ABNORMAL BLOOD CHEMISTRY 04/25/2008  . Gout 10/21/2007  . DEPRESSION 10/21/2007  . VENTRAL HERNIA 10/21/2007  . HIATAL HERNIA 10/21/2007  . FATIGUE 10/21/2007  . SKIN CANCER, HX OF 10/21/2007  . NEPHROLITHIASIS, HX OF 10/21/2007    Fatime Biswell Nilda Simmer PT, MPH 02/22/2018, 12:09 PM  [facility] [place], [place], [address] Phone: [phone]   Fax:  [phone]  Name: William Hendrix MRN: [id number] Date of Birth: [birth year]

## 2018-02-24 ENCOUNTER — Ambulatory Visit: Payer: Medicare HMO | Admitting: Rehabilitative and Restorative Service Providers"

## 2018-02-24 ENCOUNTER — Encounter: Payer: Self-pay | Admitting: Rehabilitative and Restorative Service Providers"

## 2018-02-24 DIAGNOSIS — M5442 Lumbago with sciatica, left side: Secondary | ICD-10-CM

## 2018-02-24 DIAGNOSIS — M5441 Lumbago with sciatica, right side: Secondary | ICD-10-CM

## 2018-02-24 DIAGNOSIS — R29898 Other symptoms and signs involving the musculoskeletal system: Secondary | ICD-10-CM

## 2018-02-24 DIAGNOSIS — G8929 Other chronic pain: Secondary | ICD-10-CM | POA: Diagnosis not present

## 2018-02-24 DIAGNOSIS — M6281 Muscle weakness (generalized): Secondary | ICD-10-CM | POA: Diagnosis not present

## 2018-02-24 NOTE — Patient Instructions (Signed)

## 2018-02-24 NOTE — Therapy (Signed)
[facility] [place], [place], [address] Phone: [phone]   Fax:  [phone]  Physical Therapy Treatment  Patient Details  Name: William Hendrix MRN: [id number] Date of Birth: 1941-09-25 Referring Provider: Dr Penni Homans   Encounter Date: 02/24/2018  PT End of Session - 02/24/18 1113    Visit Number  4    Number of Visits  12    Date for PT Re-Evaluation  03/29/18    PT Start Time  1106    PT Stop Time  1205    PT Time Calculation (min)  59 min    Activity Tolerance  Patient tolerated treatment well       Past Medical History:  Diagnosis Date  . AAA (abdominal aortic aneurysm) ([place]) 12/15/2014   a. Mild aneurysmal dilatation of the infrarenal abdominal aorta 3.2 cm - f/u due by 2019.  . Arthritis    "all over" (07/30/2016)  . CAD (coronary artery disease)    a. stent to LAD 2000. b. possible spasm by cath 2001. c. IVUS/PTCA/DES to mLAD 05/2013. d. PTCA/DES of dRCA into ostial rPDA 07/2013. e. PTCA of OM2, DES to Cx, DES to [facility] 07/2016.  Marland Kitchen Chronic bronchitis ([place])   . Chronic diastolic CHF (congestive heart failure) ([place])   . Chronic lower back pain   . Chronic neck pain   . CKD (chronic kidney disease), stage III ([place])   . Diabetic peripheral neuropathy ([place])   . Ejection fraction    55%, 07/2010, mild inferior hypo  . GERD (gastroesophageal reflux disease)   . Gout   . H/O diverticulitis of colon 01/31/2015  . H/O hiatal hernia   . History of blood transfusion ~ 10/1940   "had pneumonia"  . History of kidney stones   . HTN (hypertension)   . Hyperlipidemia   . Melanoma of forearm, left ([place]) 02/2011   with wide excision   . Myocardial infarction ([place])   . Nephrolithiasis   . Obesity   . OSA on CPAP     [name] since 2000  . Peripheral neuropathy    both feet  . Positive D-dimer    a. significant elevation ,hospital 07/2010, etiology unclear. b. D Dimer chronically > 20.  . Skin  cancer   . Spinal stenosis    a. s/p surgical repair 2013  . Spinal stenosis of lumbar region   . Type II diabetes mellitus ([place])   . Ventral hernia     Past Surgical History:  Procedure Laterality Date  . ANTERIOR LAT LUMBAR FUSION  10/22/2017   Procedure: Lumbar two-three Lumbar three-four Lumbar four-five Anteriolateral lumbar interbody fusion with percutaneous pedicle screw fixation and infuse;  Surgeon: [name], MD;  Location: [place];  Service: Neurosurgery;;  . APPLICATION OF ROBOTIC ASSISTANCE FOR SPINAL PROCEDURE  10/22/2017   Procedure: APPLICATION OF ROBOTIC ASSISTANCE FOR SPINAL PROCEDURE;  Surgeon: [name], MD;  Location: [place];  Service: Neurosurgery;;  . BACK SURGERY    . CARDIAC CATHETERIZATION N/A 07/30/2016   Procedure: Left Heart Cath and Coronary Angiography;  Surgeon: Nelva Bush, MD;  Location: [place] CV LAB;  Service: Cardiovascular;  Laterality: N/A;  . CARDIAC CATHETERIZATION N/A 07/30/2016   Procedure: Coronary Stent Intervention;  Surgeon: Nelva Bush, MD;  Location: [place] CV LAB;  Service: Cardiovascular;  Laterality: N/A;  Mid CFX and MID RCA  . CARDIAC CATHETERIZATION N/A 07/30/2016   Procedure: Coronary Balloon Angioplasty;  Surgeon: Nelva Bush, MD;  Location: [place] CV LAB;  Service: Cardiovascular;  Laterality: N/A;  OM 1  . CARPAL TUNNEL RELEASE Left   . CATARACT EXTRACTION W/ INTRAOCULAR LENS  IMPLANT, BILATERAL Bilateral ~ 2014  . CERVICAL DISC SURGERY  1990s  . CORONARY ANGIOPLASTY WITH STENT PLACEMENT  05/2013  . CORONARY ANGIOPLASTY WITH STENT PLACEMENT  07/26/2013   DES to RCA extending to PDA    . CORONARY ANGIOPLASTY WITH STENT PLACEMENT  07/30/2016  . CORONARY ANGIOPLASTY WITH STENT PLACEMENT  2000   CAD  . DENTAL SURGERY  04/2016   "got infected; had to dig it out"  . EYE SURGERY    . KNEE ARTHROSCOPY Right 1990's   rt  . LEFT AND RIGHT HEART CATHETERIZATION WITH CORONARY ANGIOGRAM N/A 07/26/2013    Procedure: LEFT AND RIGHT HEART CATHETERIZATION WITH CORONARY ANGIOGRAM;  Surgeon: [name], MD;  Location: [place] CATH LAB;  Service: Cardiovascular;  Laterality: N/A;  . LEFT HEART CATH AND CORONARY ANGIOGRAPHY N/A 02/26/2017   Procedure: Left Heart Cath and Coronary Angiography;  Surgeon: Sherren Mocha, MD;  Location: [place] CV LAB;  Service: Cardiovascular;  Laterality: N/A;  . LEFT HEART CATHETERIZATION WITH CORONARY ANGIOGRAM N/A 05/19/2013   Procedure: LEFT HEART CATHETERIZATION WITH CORONARY ANGIOGRAM;  Surgeon: [name], MD;  Location: [facility] CATH LAB;  Service: Cardiovascular;  Laterality: N/A;  . LUMBAR LAMINECTOMY/DECOMPRESSION MICRODISCECTOMY  08/30/2012   Procedure: LUMBAR LAMINECTOMY/DECOMPRESSION MICRODISCECTOMY 2 LEVELS;  Surgeon: [name], MD;  Location: [place] NEURO ORS;  Service: Neurosurgery;  Laterality: Bilateral;  Bilateral Lumbar three-four Lumbar four-five Laminotomies  . LUMBAR PERCUTANEOUS PEDICLE SCREW 1 LEVEL Bilateral 10/22/2017   Procedure: LUMBAR PERCUTANEOUS PEDICLE SCREW 1 LEVEL;  Surgeon: [name], MD;  Location: [facility];  Service: Neurosurgery;  Laterality: Bilateral;  . LUMBAR SPINE SURGERY  04/07/2016   Dr. Ellene Route; "?ruptured disc"  . MELANOMA EXCISION Left 02/2011   forearm  . NASAL SINUS SURGERY  1970s   "cut windows in sinus pockets"  . PERCUTANEOUS CORONARY STENT INTERVENTION (PCI-S)  05/19/2013   Procedure: PERCUTANEOUS CORONARY STENT INTERVENTION (PCI-S);  Surgeon: [name], MD;  Location: [facility] CATH LAB;  Service: Cardiovascular;;  . ULNAR TUNNEL RELEASE Left 04/07/2016   Dr. Ellene Route    There were no vitals filed for this visit.  Subjective Assessment - 02/24/18 1118    Subjective  Continued pain in the LB - did not sleep well last night. Wife having some health problems. Cut his and his neighbors grass with his riding mower yesterday. Does not feel that caused any trouble     Currently in Pain?  Yes    Pain Score  6     Pain  Location  Back    Pain Orientation  Lower;Mid;Right    Pain Descriptors / Indicators  Throbbing;Sore    Pain Type  Chronic pain    Pain Onset  More than a month ago    Pain Frequency  Constant                       [facility] Adult PT Treatment/Exercise - 02/24/18 0001      Self-Care   Self-Care  -- working on transfers/transitonal mvts-sit/SL/supine/rolling      Neuro Re-ed    Neuro Re-ed Details   working on standing posture and alignment       Lumbar Exercises: Stretches   Passive Hamstring Stretch  Right;Left;3 reps;30 seconds supine w/ strap with PT assist  Gastroc Stretch  Right;Left;3 reps;30 seconds      Lumbar Exercises: Aerobic   Nustep  L5 x 6 min LE only       Lumbar Exercises: Standing   Other Standing Lumbar Exercises  hip abduction leading with heel x 10 each LE; hip extension knee straight x 10 each LE      Lumbar Exercises: Seated   Sit to Stand  10 reps VC to engage core       Lumbar Exercises: Sidelying   Other Sidelying Lumbar Exercises  sidelying resting top LE on bolster to gently stretch anterior hip and quad with PT assist - ~ 2-3 min total time on each side with PT encouragement and assist for knee flexion in sidelying topmost leg supported       Moist Heat Therapy   Number Minutes Moist Heat  15 Minutes    Moist Heat Location  Lumbar Spine supine LE's supported on bolster              PT Education - 02/24/18 1143    Education provided  Yes    Education Details  DN    Person(s) Educated  Patient    Methods  Explanation    Comprehension  Verbalized understanding          PT Long Term Goals - 02/22/18 1026      PT LONG TERM GOAL #1   Title  Improve LE mobility and ROM to WFL's thus decreasing muscular tightness and cramping/pain 03/29/18    Time  6    Period  Weeks    Status  On-going      PT LONG TERM GOAL #2   Title  Increase strength bilat LE's to 4+/5 to 5-/5 throughout 03/29/18    Time  6    Period  Weeks     Status  On-going      PT LONG TERM GOAL #3   Title  Progress with ADL's and functional activities with plan to return to boating and fishing 03/29/18    Time  6    Period  Weeks    Status  On-going      PT LONG TERM GOAL #4   Title  Independent in HEP 03/19/18    Time  6    Period  Weeks    Status  On-going      PT LONG TERM GOAL #5   Title  Improve FOTO to </= 56% limitation 03/29/18    Time  6    Period  Weeks    Status  On-going            Plan - 02/24/18 1119    Clinical Impression Statement  Continued pain in Rt LB and Rt hip/LE > Lt. Pain is present with most activities. He continues to have difficulty with sleeping - some nights more so than others. Working on his exercises at home. He cut his and his neighbors grass on the riding mower yesterday. Did not feel like it irritated his back. Plans to go fishing this weekend. ([place] Patient added standing exercises today with focus on upright posture. Worked for the first time lying down. Worked on transfers and transitional movements. PT assist working on stretch for hip flexors and quads in sidelying with LE's supported on the bolster. Discussed trial of DN for areas of tightnes in bilat hips/LE's. Tolerated incresaed exercise/activities in the clinic today. Progressing gradually toward stated  goals of therapy.     Rehab Potential  Good    PT Frequency  2x / week    PT Duration  6 weeks    PT Treatment/Interventions  Patient/family education;ADLs/Self Care Home Management;Cryotherapy;Electrical Stimulation;Iontophoresis 4mg /ml Dexamethasone;Moist Heat;Ultrasound;Dry needling;Manual techniques;Neuromuscular re-education;Gait training;Functional mobility training;Therapeutic activities;Therapeutic exercise;Balance training    PT Next Visit Plan  continue stretching for hip flexors/quads in sidelying;  focus on core stabilization and LE strengthening - functional activities did well with lying  down     Consulted and Agree with Plan of Care  Patient       Patient will benefit from skilled therapeutic intervention in order to improve the following deficits and impairments:  Postural dysfunction, Improper body mechanics, Pain, Increased fascial restricitons, Increased muscle spasms, Decreased mobility, Decreased range of motion, Decreased strength, Decreased activity tolerance  Visit Diagnosis: Chronic bilateral low back pain with bilateral sciatica  Muscle weakness (generalized)  Other symptoms and signs involving the musculoskeletal system     Problem List Patient Active Problem List   Diagnosis Date Noted  . Anemia 11/07/2017  . Constipation 11/07/2017  . Lumbar stenosis with neurogenic claudication 10/22/2017  . Back pain 08/29/2017  . Right-sided low back pain with right-sided sciatica 11/22/2016  . Pressure injury of skin 08/03/2016  . Dyspnea 08/01/2016  . NSTEMI (non-ST elevated myocardial infarction) ([place]) 07/30/2016  . CAD in native artery   . Obesity   . Uncontrolled type 2 diabetes mellitus with circulatory disorder, with long-term current use of insulin ([place]) 11/28/2015  . Lumbar radiculopathy 10/09/2015  . Neuropathy, diabetic ([place]) 05/22/2015  . H/O diverticulitis of colon 01/31/2015  . AAA (abdominal aortic aneurysm) ([place]) 01/01/2015  . Sinusitis 10/10/2014  . Hip pain, bilateral 07/05/2014  . Palpitations 02/27/2014  . Claudication of lower extremity ([place]) 10/25/2013  . BPH with obstruction/lower urinary tract symptoms 10/25/2013  . Hypertriglyceridemia 08/15/2012  . Melanoma ([place])   . Dizziness   . Renal insufficiency   . Coronary artery disease with exertional angina ([place])   . Hyperlipidemia   . Essential hypertension   . OSA (obstructive sleep apnea)   . Ejection fraction   . SOB (shortness of breath)   . PARESTHESIA 05/30/2009  . EDEMA 01/22/2009  . OTHER ABNORMAL BLOOD CHEMISTRY 04/25/2008  . Gout 10/21/2007  . DEPRESSION 10/21/2007   . VENTRAL HERNIA 10/21/2007  . HIATAL HERNIA 10/21/2007  . FATIGUE 10/21/2007  . SKIN CANCER, HX OF 10/21/2007  . NEPHROLITHIASIS, HX OF 10/21/2007    Minahil Quinlivan Nilda Simmer PT, MPH  02/24/2018, 1:44 PM  [facility] [place], [place], [address] Phone: [phone]   Fax:  [phone]  Name: William Hendrix MRN: [id number] Date of Birth: 1941-08-14

## 2018-03-01 ENCOUNTER — Ambulatory Visit: Payer: Medicare HMO | Admitting: Physical Therapy

## 2018-03-01 DIAGNOSIS — G8929 Other chronic pain: Secondary | ICD-10-CM

## 2018-03-01 DIAGNOSIS — M5442 Lumbago with sciatica, left side: Secondary | ICD-10-CM

## 2018-03-01 DIAGNOSIS — M6281 Muscle weakness (generalized): Secondary | ICD-10-CM

## 2018-03-01 DIAGNOSIS — R29898 Other symptoms and signs involving the musculoskeletal system: Secondary | ICD-10-CM | POA: Diagnosis not present

## 2018-03-01 DIAGNOSIS — M5441 Lumbago with sciatica, right side: Secondary | ICD-10-CM

## 2018-03-01 NOTE — Therapy (Signed)
[place], [place], [address] Phone: [phone]   Fax:  [phone]  Physical Therapy Treatment  Patient Details  Name: William Hendrix MRN: [id number] Date of Birth: 1941-02-11 Referring Provider: [name]   Encounter Date: 03/01/2018  PT End of Session - 03/01/18 0941    Visit Number  5    Number of Visits  12    Date for PT Re-Evaluation  03/29/18    PT Start Time  0935    PT Stop Time  1033 MHP  Last 15 min    PT Time Calculation (min)  58 min       Past Medical History:  Diagnosis Date  . AAA (abdominal aortic aneurysm) ([place]) 12/15/2014   a. Mild aneurysmal dilatation of the infrarenal abdominal aorta 3.2 cm - f/u due by 2019.  . Arthritis    "all over" (07/30/2016)  . CAD (coronary artery disease)    a. stent to LAD 2000. b. possible spasm by cath 2001. c. IVUS/PTCA/DES to mLAD 05/2013. d. PTCA/DES of dRCA into ostial rPDA 07/2013. e. PTCA of OM2, DES to Cx, DES to [facility] 07/2016.  Marland Kitchen Chronic bronchitis ([place])   . Chronic diastolic CHF (congestive heart failure) ([place])   . Chronic lower back pain   . Chronic neck pain   . CKD (chronic kidney disease), stage III ([place])   . Diabetic peripheral neuropathy ([place])   . Ejection fraction    55%, 07/2010, mild inferior hypo  . GERD (gastroesophageal reflux disease)   . Gout   . H/O diverticulitis of colon 01/31/2015  . H/O hiatal hernia   . History of blood transfusion ~ 10/1940   "had pneumonia"  . History of kidney stones   . HTN (hypertension)   . Hyperlipidemia   . Melanoma of forearm, left ([place]) 02/2011   with wide excision   . Myocardial infarction ([place])   . Nephrolithiasis   . Obesity   . OSA on CPAP     [name] since 2000  . Peripheral neuropathy    both feet  . Positive D-dimer    a. significant elevation ,hospital 07/2010, etiology unclear. b. D Dimer chronically > 20.  . Skin cancer   . Spinal stenosis    a. s/p  surgical repair 2013  . Spinal stenosis of lumbar region   . Type II diabetes mellitus ([place])   . Ventral hernia     Past Surgical History:  Procedure Laterality Date  . ANTERIOR LAT LUMBAR FUSION  10/22/2017   Procedure: Lumbar two-three Lumbar three-four Lumbar four-five Anteriolateral lumbar interbody fusion with percutaneous pedicle screw fixation and infuse;  Surgeon: [name], MD;  Location: [facility];  Service: Neurosurgery;;  . APPLICATION OF ROBOTIC ASSISTANCE FOR SPINAL PROCEDURE  10/22/2017   Procedure: APPLICATION OF ROBOTIC ASSISTANCE FOR SPINAL PROCEDURE;  Surgeon: [name], MD;  Location: [facility];  Service: Neurosurgery;;  . BACK SURGERY    . CARDIAC CATHETERIZATION N/A 07/30/2016   Procedure: Left Heart Cath and Coronary Angiography;  Surgeon: Nelva Bush, MD;  Location: [facility] [place] CV LAB;  Service: Cardiovascular;  Laterality: N/A;  . CARDIAC CATHETERIZATION N/A 07/30/2016   Procedure: Coronary Stent Intervention;  Surgeon: Nelva Bush, MD;  Location: [facility] CV LAB;  Service: Cardiovascular;  Laterality: N/A;  Mid CFX and MID RCA  . CARDIAC CATHETERIZATION N/A 07/30/2016   Procedure: Coronary Balloon Angioplasty;  Surgeon: Nelva Bush, MD;  Location: [facility] INVASIVE CV  LAB;  Service: Cardiovascular;  Laterality: N/A;  OM 1  . CARPAL TUNNEL RELEASE Left   . CATARACT EXTRACTION W/ INTRAOCULAR LENS  IMPLANT, BILATERAL Bilateral ~ 2014  . CERVICAL DISC SURGERY  1990s  . CORONARY ANGIOPLASTY WITH STENT PLACEMENT  05/2013  . CORONARY ANGIOPLASTY WITH STENT PLACEMENT  07/26/2013   DES to RCA extending to PDA    . CORONARY ANGIOPLASTY WITH STENT PLACEMENT  07/30/2016  . CORONARY ANGIOPLASTY WITH STENT PLACEMENT  2000   CAD  . DENTAL SURGERY  04/2016   "got infected; had to dig it out"  . EYE SURGERY    . KNEE ARTHROSCOPY Right 1990's   rt  . LEFT AND RIGHT HEART CATHETERIZATION WITH CORONARY ANGIOGRAM N/A 07/26/2013   Procedure: LEFT AND RIGHT HEART  CATHETERIZATION WITH CORONARY ANGIOGRAM;  Surgeon: [name], MD;  Location: [facility] CATH LAB;  Service: Cardiovascular;  Laterality: N/A;  . LEFT HEART CATH AND CORONARY ANGIOGRAPHY N/A 02/26/2017   Procedure: Left Heart Cath and Coronary Angiography;  Surgeon: Sherren Mocha, MD;  Location: [facility] CV LAB;  Service: Cardiovascular;  Laterality: N/A;  . LEFT HEART CATHETERIZATION WITH CORONARY ANGIOGRAM N/A 05/19/2013   Procedure: LEFT HEART CATHETERIZATION WITH CORONARY ANGIOGRAM;  Surgeon: [name], MD;  Location: [facility] CATH LAB;  Service: Cardiovascular;  Laterality: N/A;  . LUMBAR LAMINECTOMY/DECOMPRESSION MICRODISCECTOMY  08/30/2012   Procedure: LUMBAR LAMINECTOMY/DECOMPRESSION MICRODISCECTOMY 2 LEVELS;  Surgeon: [name], MD;  Location: [place] NEURO ORS;  Service: Neurosurgery;  Laterality: Bilateral;  Bilateral Lumbar three-four Lumbar four-five Laminotomies  . LUMBAR PERCUTANEOUS PEDICLE SCREW 1 LEVEL Bilateral 10/22/2017   Procedure: LUMBAR PERCUTANEOUS PEDICLE SCREW 1 LEVEL;  Surgeon: [name], MD;  Location: [facility];  Service: Neurosurgery;  Laterality: Bilateral;  . LUMBAR SPINE SURGERY  04/07/2016   Dr. Ellene Route; "?ruptured disc"  . MELANOMA EXCISION Left 02/2011   forearm  . NASAL SINUS SURGERY  1970s   "cut windows in sinus pockets"  . PERCUTANEOUS CORONARY STENT INTERVENTION (PCI-S)  05/19/2013   Procedure: PERCUTANEOUS CORONARY STENT INTERVENTION (PCI-S);  Surgeon: [name], MD;  Location: [facility] CATH LAB;  Service: Cardiovascular;;  . ULNAR TUNNEL RELEASE Left 04/07/2016   Dr. Ellene Route    There were no vitals filed for this visit.  Subjective Assessment - 03/01/18 0943    Subjective  Pt reports he stood for 3 hours in his boat yesterday and then drove 2 hours home.  He's complaining of stiff legs and tight Rt hip.      Patient Stated Goals  get rid of lumbar support and walker; get stronger in back and legs - go on his boat to go fishing     Currently in Pain?  Yes     Pain Score  6     Pain Location  Back    Pain Orientation  Right;Lower    Pain Descriptors / Indicators  Sore;Throbbing;Tightness    Pain Radiating Towards  Rt posterior hip to anterior this to calf    Aggravating Factors   prolonged sitting, standing, walking, any bending    Pain Relieving Factors  meds, TENS, lying in bed with legs elevated.          [facility] PT Assessment - 03/01/18 0001      Assessment   Medical Diagnosis  Back pain     Referring Provider  [name]    Onset Date/Surgical Date  10/22/17    Hand Dominance  Right  [facility] Adult PT Treatment/Exercise - 03/01/18 0001      Lumbar Exercises: Stretches   Passive Hamstring Stretch  Right;Left;2 reps;60 seconds seated, straight back    Hip Flexor Stretch  Left;Right;2 reps;30 seconds standing, reaching LE back, cues on form.     Quad Stretch  Right;Left;2 reps;60 seconds sitting with knee flexed, foot under chair       Lumbar Exercises: Aerobic   Nustep  L5 x 6 min LE only  PTA present to discuss progress      Lumbar Exercises: Standing   Other Standing Lumbar Exercises  hip abduction leading with heel x 10 each LE; hip extension knee straight x 10 each LE      Lumbar Exercises: Seated   Sit to Stand  10 reps VC for core engagement. 5 reps with RLE forward.       Moist Heat Therapy   Number Minutes Moist Heat  15 Minutes    Moist Heat Location  Lumbar Spine supine LE's supported on bolster       Manual Therapy   Manual Therapy  Soft tissue mobilization    Manual therapy comments  Pt in Lt sidelying    Soft tissue mobilization  STM to Rt hip and hamstring; TPR to Rt hip rotators                   PT Long Term Goals - 02/22/18 1026      PT LONG TERM GOAL #1   Title  Improve LE mobility and ROM to WFL's thus decreasing muscular tightness and cramping/pain 03/29/18    Time  6    Period  Weeks    Status  On-going      PT LONG TERM GOAL #2   Title  Increase strength bilat LE's to 4+/5  to 5-/5 throughout 03/29/18    Time  6    Period  Weeks    Status  On-going      PT LONG TERM GOAL #3   Title  Progress with ADL's and functional activities with plan to return to boating and fishing 03/29/18    Time  6    Period  Weeks    Status  On-going      PT LONG TERM GOAL #4   Title  Independent in HEP 03/19/18    Time  6    Period  Weeks    Status  On-going      PT LONG TERM GOAL #5   Title  Improve FOTO to </= 56% limitation 03/29/18    Time  6    Period  Weeks    Status  On-going            Plan - 03/01/18 1025    Clinical Impression Statement  Pt reporting 25% improvement in symptoms and mobility since initiating therapy. Pt reported increased RLE with standing activities.  Focus of treatment was on stretching LE and core engagement with transitions.  Pt reported mild decrease in pain after manual therapy and MHP.  Progressing gradually towards therapy goals.     Rehab Potential  Good    PT Frequency  2x / week    PT Duration  6 weeks    PT Treatment/Interventions  Patient/family education;ADLs/Self Care Home Management;Cryotherapy;Electrical Stimulation;Iontophoresis 4mg /ml Dexamethasone;Moist Heat;Ultrasound;Dry needling;Manual techniques;Neuromuscular re-education;Gait training;Functional mobility training;Therapeutic activities;Therapeutic exercise;Balance training    PT Next Visit Plan  continue progressing core stabilization and stretching for LE. Add bridging to HEP.  Consulted and Agree with Plan of Care  Patient       Patient will benefit from skilled therapeutic intervention in order to improve the following deficits and impairments:  Postural dysfunction, Improper body mechanics, Pain, Increased fascial restricitons, Increased muscle spasms, Decreased mobility, Decreased range of motion, Decreased strength, Decreased activity tolerance  Visit Diagnosis: Chronic bilateral low back pain with bilateral sciatica  Muscle weakness (generalized)  Other  symptoms and signs involving the musculoskeletal system     Problem List Patient Active Problem List   Diagnosis Date Noted  . Anemia 11/07/2017  . Constipation 11/07/2017  . Lumbar stenosis with neurogenic claudication 10/22/2017  . Back pain 08/29/2017  . Right-sided low back pain with right-sided sciatica 11/22/2016  . Pressure injury of skin 08/03/2016  . Dyspnea 08/01/2016  . NSTEMI (non-ST elevated myocardial infarction) ([place]) 07/30/2016  . CAD in native artery   . Obesity   . Uncontrolled type 2 diabetes mellitus with circulatory disorder, with long-term current use of insulin ([place]) 11/28/2015  . Lumbar radiculopathy 10/09/2015  . Neuropathy, diabetic ([place]) 05/22/2015  . H/O diverticulitis of colon 01/31/2015  . AAA (abdominal aortic aneurysm) ([place]) 01/01/2015  . Sinusitis 10/10/2014  . Hip pain, bilateral 07/05/2014  . Palpitations 02/27/2014  . Claudication of lower extremity ([place]) 10/25/2013  . BPH with obstruction/lower urinary tract symptoms 10/25/2013  . Hypertriglyceridemia 08/15/2012  . Melanoma ([place])   . Dizziness   . Renal insufficiency   . Coronary artery disease with exertional angina ([place])   . Hyperlipidemia   . Essential hypertension   . OSA (obstructive sleep apnea)   . Ejection fraction   . SOB (shortness of breath)   . PARESTHESIA 05/30/2009  . EDEMA 01/22/2009  . OTHER ABNORMAL BLOOD CHEMISTRY 04/25/2008  . Gout 10/21/2007  . DEPRESSION 10/21/2007  . VENTRAL HERNIA 10/21/2007  . HIATAL HERNIA 10/21/2007  . FATIGUE 10/21/2007  . SKIN CANCER, HX OF 10/21/2007  . NEPHROLITHIASIS, HX OF 10/21/2007   Kerin Perna, PTA 03/01/18 10:46 AM  [facility] [place], [place], [address] Phone: [phone]   Fax:  [phone]  Name: William Hendrix MRN: [id number] Date of Birth: Sep 20, 1941

## 2018-03-03 ENCOUNTER — Ambulatory Visit: Payer: Medicare HMO | Admitting: Physical Therapy

## 2018-03-03 DIAGNOSIS — M6281 Muscle weakness (generalized): Secondary | ICD-10-CM

## 2018-03-03 DIAGNOSIS — M5441 Lumbago with sciatica, right side: Secondary | ICD-10-CM | POA: Diagnosis not present

## 2018-03-03 DIAGNOSIS — R29898 Other symptoms and signs involving the musculoskeletal system: Secondary | ICD-10-CM | POA: Diagnosis not present

## 2018-03-03 DIAGNOSIS — M5442 Lumbago with sciatica, left side: Secondary | ICD-10-CM

## 2018-03-03 DIAGNOSIS — G8929 Other chronic pain: Secondary | ICD-10-CM | POA: Diagnosis not present

## 2018-03-03 NOTE — Therapy (Signed)
[place], [place], [address] Phone: [phone]   Fax:  [phone]  Physical Therapy Treatment  Patient Details  Name: William Hendrix MRN: [id number] Date of Birth: [birth year] Referring Provider: [name]   Encounter Date: 03/03/2018  PT End of Session - 03/03/18 1115    Visit Number  6    Number of Visits  12    Date for PT Re-Evaluation  03/29/18    PT Start Time  1109 pt arrived late    PT Stop Time  1210    PT Time Calculation (min)  61 min    Activity Tolerance  Patient limited by pain       Past Medical History:  Diagnosis Date  . AAA (abdominal aortic aneurysm) ([place]) 12/15/2014   a. Mild aneurysmal dilatation of the infrarenal abdominal aorta 3.2 cm - f/u due by 2019.  . Arthritis    "all over" (07/30/2016)  . CAD (coronary artery disease)    a. stent to LAD 2000. b. possible spasm by cath 2001. c. IVUS/PTCA/DES to mLAD 05/2013. d. PTCA/DES of dRCA into ostial rPDA 07/2013. e. PTCA of OM2, DES to Cx, DES to [facility] 07/2016.  Marland Kitchen Chronic bronchitis ([place])   . Chronic diastolic CHF (congestive heart failure) ([place])   . Chronic lower back pain   . Chronic neck pain   . CKD (chronic kidney disease), stage III ([place])   . Diabetic peripheral neuropathy ([place])   . Ejection fraction    55%, 07/2010, mild inferior hypo  . GERD (gastroesophageal reflux disease)   . Gout   . H/O diverticulitis of colon 01/31/2015  . H/O hiatal hernia   . History of blood transfusion ~ 10/1940   "had pneumonia"  . History of kidney stones   . HTN (hypertension)   . Hyperlipidemia   . Melanoma of forearm, left ([place]) 02/2011   with wide excision   . Myocardial infarction ([place])   . Nephrolithiasis   . Obesity   . OSA on CPAP     [name] since 2000  . Peripheral neuropathy    both feet  . Positive D-dimer    a. significant elevation ,hospital 07/2010, etiology unclear. b. D Dimer chronically > 20.  .  Skin cancer   . Spinal stenosis    a. s/p surgical repair 2013  . Spinal stenosis of lumbar region   . Type II diabetes mellitus ([place])   . Ventral hernia     Past Surgical History:  Procedure Laterality Date  . ANTERIOR LAT LUMBAR FUSION  10/22/2017   Procedure: Lumbar two-three Lumbar three-four Lumbar four-five Anteriolateral lumbar interbody fusion with percutaneous pedicle screw fixation and infuse;  Surgeon: [name], MD;  Location: [facility];  Service: Neurosurgery;;  . APPLICATION OF ROBOTIC ASSISTANCE FOR SPINAL PROCEDURE  10/22/2017   Procedure: APPLICATION OF ROBOTIC ASSISTANCE FOR SPINAL PROCEDURE;  Surgeon: [name], MD;  Location: [facility];  Service: Neurosurgery;;  . BACK SURGERY    . CARDIAC CATHETERIZATION N/A 07/30/2016   Procedure: Left Heart Cath and Coronary Angiography;  Surgeon: Nelva Bush, MD;  Location: [facility];  Service: Cardiovascular;  Laterality: N/A;  . CARDIAC CATHETERIZATION N/A 07/30/2016   Procedure: Coronary Stent Intervention;  Surgeon: Nelva Bush, MD;  Location: [facility];  Service: Cardiovascular;  Laterality: N/A;  Mid CFX and MID RCA  . CARDIAC CATHETERIZATION N/A 07/30/2016   Procedure: Coronary Balloon Angioplasty;  Surgeon:  Nelva Bush, MD;  Location: [facility];  Service: Cardiovascular;  Laterality: N/A;  OM 1  . CARPAL TUNNEL RELEASE Left   . CATARACT EXTRACTION W/ INTRAOCULAR LENS  IMPLANT, BILATERAL Bilateral ~ 2014  . CERVICAL DISC SURGERY  1990s  . CORONARY ANGIOPLASTY WITH STENT PLACEMENT  05/2013  . CORONARY ANGIOPLASTY WITH STENT PLACEMENT  07/26/2013   DES to RCA extending to PDA    . CORONARY ANGIOPLASTY WITH STENT PLACEMENT  07/30/2016  . CORONARY ANGIOPLASTY WITH STENT PLACEMENT  2000   CAD  . DENTAL SURGERY  04/2016   "got infected; had to dig it out"  . EYE SURGERY    . KNEE ARTHROSCOPY Right 1990's   rt  . LEFT AND RIGHT HEART CATHETERIZATION WITH CORONARY ANGIOGRAM N/A 07/26/2013    Procedure: LEFT AND RIGHT HEART CATHETERIZATION WITH CORONARY ANGIOGRAM;  Surgeon: [name], MD;  Location: [facility] CATH LAB;  Service: Cardiovascular;  Laterality: N/A;  . LEFT HEART CATH AND CORONARY ANGIOGRAPHY N/A 02/26/2017   Procedure: Left Heart Cath and Coronary Angiography;  Surgeon: Sherren Mocha, MD;  Location: [facility] CV LAB;  Service: Cardiovascular;  Laterality: N/A;  . LEFT HEART CATHETERIZATION WITH CORONARY ANGIOGRAM N/A 05/19/2013   Procedure: LEFT HEART CATHETERIZATION WITH CORONARY ANGIOGRAM;  Surgeon: [name], MD;  Location: [facility] C[facility] LAB;  Service: Cardiovascular;  Laterality: N/A;  . LUMBAR LAMINECTOMY/DECOMPRESSION MICRODISCECTOMY  08/30/2012   Procedure: LUMBAR LAMINECTOMY/DECOMPRESSION MICRODISCECTOMY 2 LEVELS;  Surgeon: [name], MD;  Location: [facility] NEURO ORS;  Service: Neurosurgery;  Laterality: Bilateral;  Bilateral Lumbar three-four Lumbar four-five Laminotomies  . LUMBAR PERCUTANEOUS PEDICLE SCREW 1 LEVEL Bilateral 10/22/2017   Procedure: LUMBAR PERCUTANEOUS PEDICLE SCREW 1 LEVEL;  Surgeon: [name], MD;  Location: [facility];  Service: Neurosurgery;  Laterality: Bilateral;  . LUMBAR SPINE SURGERY  04/07/2016   Dr. Ellene Route; "?ruptured disc"  . MELANOMA EXCISION Left 02/2011   forearm  . NASAL SINUS SURGERY  1970s   "cut windows in sinus pockets"  . PERCUTANEOUS CORONARY STENT INTERVENTION (PCI-S)  05/19/2013   Procedure: PERCUTANEOUS CORONARY STENT INTERVENTION (PCI-S);  Surgeon: [name], MD;  Location: [facility] CATH LAB;  Service: Cardiovascular;;  . ULNAR TUNNEL RELEASE Left 04/07/2016   Dr. Ellene Route    There were no vitals filed for this visit.  Subjective Assessment - 03/03/18 1112    Subjective  Pt reports he had a bad night. He ran out of pain medicine and the Dr is out of town.  He didn't sleep more than 3 hours.  He presents without brace donned, "I ran out of time to put it on".    He rode on [employer], nature for 1.5 hr yesterday.      Patient  Stated Goals  get rid of lumbar support and walker; get stronger in back and legs - go on his boat to go fishing     Currently in Pain?  Yes    Pain Score  7     Pain Location  Back    Pain Orientation  Right         [facility] PT Assessment - 03/03/18 0001      Assessment   Medical Diagnosis  Back pain     Referring Provider  [name]    Onset Date/Surgical Date  10/22/17    Hand Dominance  Right    Next MD Visit  to schedule        [facility] Adult PT Treatment/Exercise - 03/03/18  0001      Lumbar Exercises: Stretches   Passive Hamstring Stretch  Right;Left;2 reps;60 seconds seated, straight back    Hip Flexor Stretch  Right;Left;3 reps;30 seconds sitting at edge of chair     Standing Extension  3 reps;5 seconds    Quad Stretch  Right;Left;2 reps;60 seconds sitting with knee flexed, foot under chair     Gastroc Stretch  Left;Right;2 reps;30 seconds    Other Lumbar Stretch Exercise  midlevel doorway stretch x 30 sec       Lumbar Exercises: Aerobic   Nustep  L4 x 6 min LE only  PTA present to discuss progress      Lumbar Exercises: Seated   Sit to Stand  5 reps core emgaged.     Other Seated Lumbar Exercises  scapular retraction 10 x 2 sets core engaged  red band.       Lumbar Exercises: Supine   Bridge  -- 3 reps      Modalities   Modalities  Electrical Stimulation      Moist Heat Therapy   Number Minutes Moist Heat  15 Minutes - extra layer needed due to sensitivity   Moist Heat Location  Lumbar Spine;Hip supine LE's supported on Air cabin crew Stimulation Location  Rt hip / Rt thigh    Electrical Stimulation Action  premod to each area    Electrical Stimulation Parameters  to tolerance    Electrical Stimulation Goals  Pain      Manual Therapy   Manual therapy comments  Pt in Lt sidelying    Soft tissue mobilization  STM to Rt hip rotators/abductors; Edge tool assistance to Rt quad to decrease fascial restrictions and STM to  same area.                   PT Long Term Goals - 03/03/18 1205      PT LONG TERM GOAL #1   Title  Improve LE mobility and ROM to WFL's thus decreasing muscular tightness and cramping/pain 03/29/18    Time  6    Period  Weeks    Status  On-going      PT LONG TERM GOAL #2   Title  Increase strength bilat LE's to 4+/5 to 5-/5 throughout 03/29/18    Time  6    Period  Weeks    Status  On-going      PT LONG TERM GOAL #3   Title  Progress with ADL's and functional activities with plan to return to boating and fishing 03/29/18    Period  Weeks    Status  On-going      PT LONG TERM GOAL #4   Title  Independent in HEP 03/19/18    Time  6    Period  Weeks    Status  On-going            Plan - 03/03/18 1206    Clinical Impression Statement  Pt had flare up of symptoms over last day.  Pt had decreased tolerance for exercise due to increased pain and required increased time between exercises.  His Rt hip flexor/quad remain tight and tender. Continued education on core support for transitions; reported reduced pain with sit to stand with core engaged.  Trial of estim to Rt hip/thigh to decrease pain and spasm.   Pt reported slight reduction of pain at end of session.     Rehab Potential  Good    PT Frequency  2x / week    PT Duration  6 weeks    PT Treatment/Interventions  Patient/family education;ADLs/Self Care Home Management;Cryotherapy;Electrical Stimulation;Iontophoresis 4mg /ml Dexamethasone;Moist Heat;Ultrasound;Dry needling;Manual techniques;Neuromuscular re-education;Gait training;Functional mobility training;Therapeutic activities;Therapeutic exercise;Balance training    PT Next Visit Plan  continue progressing core stabilization and stretching for LE. Add bridging to HEP.   Pt interested in DN next session.     Consulted and Agree with Plan of Care  Patient       Patient will benefit from skilled therapeutic intervention in order to improve the following deficits and  impairments:  Postural dysfunction, Improper body mechanics, Pain, Increased fascial restricitons, Increased muscle spasms, Decreased mobility, Decreased range of motion, Decreased strength, Decreased activity tolerance  Visit Diagnosis: Chronic bilateral low back pain with bilateral sciatica  Muscle weakness (generalized)  Other symptoms and signs involving the musculoskeletal system     Problem List Patient Active Problem List   Diagnosis Date Noted  . Anemia 11/07/2017  . Constipation 11/07/2017  . Lumbar stenosis with neurogenic claudication 10/22/2017  . Back pain 08/29/2017  . Right-sided low back pain with right-sided sciatica 11/22/2016  . Pressure injury of skin 08/03/2016  . Dyspnea 08/01/2016  . NSTEMI (non-ST elevated myocardial infarction) ([place]) 07/30/2016  . CAD in native artery   . Obesity   . Uncontrolled type 2 diabetes mellitus with circulatory disorder, with long-term current use of insulin ([place]) 11/28/2015  . Lumbar radiculopathy 10/09/2015  . Neuropathy, diabetic ([place]) 05/22/2015  . H/O diverticulitis of colon 01/31/2015  . AAA (abdominal aortic aneurysm) ([place]) 01/01/2015  . Sinusitis 10/10/2014  . Hip pain, bilateral 07/05/2014  . Palpitations 02/27/2014  . Claudication of lower extremity ([place]) 10/25/2013  . BPH with obstruction/lower urinary tract symptoms 10/25/2013  . Hypertriglyceridemia 08/15/2012  . Melanoma ([place])   . Dizziness   . Renal insufficiency   . Coronary artery disease with exertional angina ([place])   . Hyperlipidemia   . Essential hypertension   . OSA (obstructive sleep apnea)   . Ejection fraction   . SOB (shortness of breath)   . PARESTHESIA 05/30/2009  . EDEMA 01/22/2009  . OTHER ABNORMAL BLOOD CHEMISTRY 04/25/2008  . Gout 10/21/2007  . DEPRESSION 10/21/2007  . VENTRAL HERNIA 10/21/2007  . HIATAL HERNIA 10/21/2007  . FATIGUE 10/21/2007  . SKIN CANCER, HX OF 10/21/2007  . NEPHROLITHIASIS, HX OF 10/21/2007   Kerin Perna, PTA 03/03/18 12:36 PM  [place], [place], [address] Phone: [phone]   Fax:  [phone]  Name: AARYN SERMON MRN: [id number] Date of Birth: 08/16/1941

## 2018-03-09 ENCOUNTER — Encounter: Payer: Self-pay | Admitting: Rehabilitative and Restorative Service Providers"

## 2018-03-09 ENCOUNTER — Ambulatory Visit: Payer: Medicare HMO | Admitting: Rehabilitative and Restorative Service Providers"

## 2018-03-09 DIAGNOSIS — M5441 Lumbago with sciatica, right side: Secondary | ICD-10-CM

## 2018-03-09 DIAGNOSIS — M6281 Muscle weakness (generalized): Secondary | ICD-10-CM | POA: Diagnosis not present

## 2018-03-09 DIAGNOSIS — M5442 Lumbago with sciatica, left side: Secondary | ICD-10-CM

## 2018-03-09 DIAGNOSIS — R29898 Other symptoms and signs involving the musculoskeletal system: Secondary | ICD-10-CM | POA: Diagnosis not present

## 2018-03-09 DIAGNOSIS — G8929 Other chronic pain: Secondary | ICD-10-CM | POA: Diagnosis not present

## 2018-03-09 NOTE — Therapy (Signed)
[place], [place], [address] Phone: [phone]   Fax:  [phone]  Physical Therapy Treatment  Patient Details  Name: William Hendrix MRN: [id number] Date of Birth: 17-May-1941 Referring Provider: [name]   Encounter Date: 03/09/2018  PT End of Session - 03/09/18 1405    Visit Number  7    Number of Visits  12    Date for PT Re-Evaluation  03/29/18    PT Start Time  1400    PT Stop Time  1456    PT Time Calculation (min)  56 min    Activity Tolerance  Patient tolerated treatment well       Past Medical History:  Diagnosis Date  . AAA (abdominal aortic aneurysm) ([place]) 12/15/2014   a. Mild aneurysmal dilatation of the infrarenal abdominal aorta 3.2 cm - f/u due by 2019.  . Arthritis    "all over" (07/30/2016)  . CAD (coronary artery disease)    a. stent to LAD 2000. b. possible spasm by cath 2001. c. IVUS/PTCA/DES to mLAD 05/2013. d. PTCA/DES of dRCA into ostial rPDA 07/2013. e. PTCA of OM2, DES to Cx, DES to [facility] 07/2016.  Marland Kitchen Chronic bronchitis ([place])   . Chronic diastolic CHF (congestive heart failure) ([place])   . Chronic lower back pain   . Chronic neck pain   . CKD (chronic kidney disease), stage III ([place])   . Diabetic peripheral neuropathy ([place])   . Ejection fraction    55%, 07/2010, mild inferior hypo  . GERD (gastroesophageal reflux disease)   . Gout   . H/O diverticulitis of colon 01/31/2015  . H/O hiatal hernia   . History of blood transfusion ~ 10/1940   "had pneumonia"  . History of kidney stones   . HTN (hypertension)   . Hyperlipidemia   . Melanoma of forearm, left ([place]) 02/2011   with wide excision   . Myocardial infarction ([place])   . Nephrolithiasis   . Obesity   . OSA on CPAP     [name] since 2000  . Peripheral neuropathy    both feet  . Positive D-dimer    a. significant elevation ,hospital 07/2010, etiology unclear. b. D Dimer chronically > 20.  . Skin  cancer   . Spinal stenosis    a. s/p surgical repair 2013  . Spinal stenosis of lumbar region   . Type II diabetes mellitus ([place])   . Ventral hernia     Past Surgical History:  Procedure Laterality Date  . ANTERIOR LAT LUMBAR FUSION  10/22/2017   Procedure: Lumbar two-three Lumbar three-four Lumbar four-five Anteriolateral lumbar interbody fusion with percutaneous pedicle screw fixation and infuse;  Surgeon: [name], MD;  Location: [facility];  Service: Neurosurgery;;  . APPLICATION OF ROBOTIC ASSISTANCE FOR SPINAL PROCEDURE  10/22/2017   Procedure: APPLICATION OF ROBOTIC ASSISTANCE FOR SPINAL PROCEDURE;  Surgeon: [name], MD;  Location: [facility];  Service: Neurosurgery;;  . BACK SURGERY    . CARDIAC CATHETERIZATION N/A 07/30/2016   Procedure: Left Heart Cath and Coronary Angiography;  Surgeon: Nelva Bush, MD;  Location: [facility] CV LAB;  Service: Cardiovascular;  Laterality: N/A;  . CARDIAC CATHETERIZATION N/A 07/30/2016   Procedure: Coronary Stent Intervention;  Surgeon: Nelva Bush, MD;  Location: [facility];  Service: Cardiovascular;  Laterality: N/A;  Mid CFX and MID RCA  . CARDIAC CATHETERIZATION N/A 07/30/2016   Procedure: Coronary Balloon Angioplasty;  Surgeon: Nelva Bush, MD;  Location: [facility];  Service: Cardiovascular;  Laterality: N/A;  OM 1  . CARPAL TUNNEL RELEASE Left   . CATARACT EXTRACTION W/ INTRAOCULAR LENS  IMPLANT, BILATERAL Bilateral ~ 2014  . CERVICAL DISC SURGERY  1990s  . CORONARY ANGIOPLASTY WITH STENT PLACEMENT  05/2013  . CORONARY ANGIOPLASTY WITH STENT PLACEMENT  07/26/2013   DES to RCA extending to PDA    . CORONARY ANGIOPLASTY WITH STENT PLACEMENT  07/30/2016  . CORONARY ANGIOPLASTY WITH STENT PLACEMENT  2000   CAD  . DENTAL SURGERY  04/2016   "got infected; had to dig it out"  . EYE SURGERY    . KNEE ARTHROSCOPY Right 1990's   rt  . LEFT AND RIGHT HEART CATHETERIZATION WITH CORONARY ANGIOGRAM N/A 07/26/2013    Procedure: LEFT AND RIGHT HEART CATHETERIZATION WITH CORONARY ANGIOGRAM;  Surgeon: [name], MD;  Location: [facility] CATH LAB;  Service: Cardiovascular;  Laterality: N/A;  . LEFT HEART CATH AND CORONARY ANGIOGRAPHY N/A 02/26/2017   Procedure: Left Heart Cath and Coronary Angiography;  Surgeon: Sherren Mocha, MD;  Location: [facility] CV LAB;  Service: Cardiovascular;  Laterality: N/A;  . LEFT HEART CATHETERIZATION WITH CORONARY ANGIOGRAM N/A 05/19/2013   Procedure: LEFT HEART CATHETERIZATION WITH CORONARY ANGIOGRAM;  Surgeon: [name], MD;  Location: [facility] C[facility] LAB;  Service: Cardiovascular;  Laterality: N/A;  . LUMBAR LAMINECTOMY/DECOMPRESSION MICRODISCECTOMY  08/30/2012   Procedure: LUMBAR LAMINECTOMY/DECOMPRESSION MICRODISCECTOMY 2 LEVELS;  Surgeon: [name], MD;  Location: [facility] NEURO ORS;  Service: Neurosurgery;  Laterality: Bilateral;  Bilateral Lumbar three-four Lumbar four-five Laminotomies  . LUMBAR PERCUTANEOUS PEDICLE SCREW 1 LEVEL Bilateral 10/22/2017   Procedure: LUMBAR PERCUTANEOUS PEDICLE SCREW 1 LEVEL;  Surgeon: [name], MD;  Location: [facility];  Service: Neurosurgery;  Laterality: Bilateral;  . LUMBAR SPINE SURGERY  04/07/2016   Dr. Ellene Route; "?ruptured disc"  . MELANOMA EXCISION Left 02/2011   forearm  . NASAL SINUS SURGERY  1970s   "cut windows in sinus pockets"  . PERCUTANEOUS CORONARY STENT INTERVENTION (PCI-S)  05/19/2013   Procedure: PERCUTANEOUS CORONARY STENT INTERVENTION (PCI-S);  Surgeon: [name], MD;  Location: [facility] CATH LAB;  Service: Cardiovascular;;  . ULNAR TUNNEL RELEASE Left 04/07/2016   Dr. Ellene Route    There were no vitals filed for this visit.  Subjective Assessment - 03/09/18 1406    Subjective  Feeling a little bit better. May be about 60% better overall. He is doing his exercises at home and his wife is putting his TENS unit on about 2-3 times each day. he went 8 days without pain medication because his doctor was out of town and no one would  re-fill his pain meds.Marland Kitchen He is now taking 1/2 of a pain pill now to wean himself off the pain meds.     Currently in Pain?  Yes    Pain Score  6     Pain Location  Back    Pain Orientation  Right    Pain Descriptors / Indicators  Sore;Throbbing;Tightness    Pain Type  Chronic pain    Pain Onset  More than a month ago    Pain Frequency  Constant                       [facility] Adult PT Treatment/Exercise - 03/09/18 0001      Lumbar Exercises: Aerobic   Nustep  L4 x 5 min UE 10 PTA present to discuss progress  Production assistant, radio Parameters  to tolerance    Electrical Stimulation Goals  Pain;Tone      Manual Therapy   Manual therapy comments  pt supine LE's supported on bolster     Soft tissue mobilization  deep tissue work through [place] and ITB        Trigger Point Dry Needling - 03/09/18 1650    Consent Given?  Yes    Education Handout Provided  Yes    Muscles Treated Lower Body  Tensor fascia lata;Quadriceps bilat with estim     Tensor Fascia Lata Response  Palpable increased muscle length    Quadriceps Response  Palpable increased muscle length           PT Education - 03/09/18 1410    Education provided  Yes    Education Details  DN    Person(s) Educated  Patient    Methods  Explanation    Comprehension  Verbalized understanding          PT Long Term Goals - 03/09/18 1653      PT LONG TERM GOAL #1   Title  Improve LE mobility and ROM to WFL's thus decreasing muscular tightness and cramping/pain 03/29/18    Time  6    Period  Weeks    Status  On-going      PT LONG TERM GOAL #2   Title  Increase strength bilat LE's to 4+/5 to 5-/5 throughout 03/29/18    Time  6    Period  Weeks    Status  On-going      PT LONG TERM GOAL #3   Title  Progress with ADL's and functional activities with plan to return to boating and  fishing 03/29/18    Time  6    Period  Weeks    Status  On-going      PT LONG TERM GOAL #4   Title  Independent in HEP 03/19/18    Time  6    Period  Weeks    Status  On-going      PT LONG TERM GOAL #5   Title  Improve FOTO to </= 56% limitation 03/29/18    Time  6    Period  Weeks    Status  On-going            Plan - 03/09/18 1650    Clinical Impression Statement  Patient reports good improvement in pain and mobility/endurance since beginning therapy - about 60% improvement overall. Trial of DN today was tolerated well with decresaed muscular tightness noted in bilat quads/TFL/ITB. Patient tolerated with some discomfort. Advised to continue with exercise, stretch and move avoiding over work. Will assess response at next visit.     Rehab Potential  Good    PT Frequency  2x / week    PT Duration  6 weeks    PT Treatment/Interventions  Patient/family education;ADLs/Self Care Home Management;Cryotherapy;Electrical Stimulation;Iontophoresis 4mg /ml Dexamethasone;Moist Heat;Ultrasound;Dry needling;Manual techniques;Neuromuscular re-education;Gait training;Functional mobility training;Therapeutic activities;Therapeutic exercise;Balance training    PT Next Visit Plan  continue progressing core stabilization and stretching for LE. Add bridging to HEP.   assess response to DN    Consulted and Agree with Plan of Care  Patient       Patient will benefit from skilled therapeutic intervention in order to improve the following deficits and impairments:  Postural  dysfunction, Improper body mechanics, Pain, Increased fascial restricitons, Increased muscle spasms, Decreased mobility, Decreased range of motion, Decreased strength, Decreased activity tolerance  Visit Diagnosis: Chronic bilateral low back pain with bilateral sciatica  Muscle weakness (generalized)  Other symptoms and signs involving the musculoskeletal system     Problem List Patient Active Problem List   Diagnosis Date  Noted  . Anemia 11/07/2017  . Constipation 11/07/2017  . Lumbar stenosis with neurogenic claudication 10/22/2017  . Back pain 08/29/2017  . Right-sided low back pain with right-sided sciatica 11/22/2016  . Pressure injury of skin 08/03/2016  . Dyspnea 08/01/2016  . NSTEMI (non-ST elevated myocardial infarction) ([place]) 07/30/2016  . CAD in native artery   . Obesity   . Uncontrolled type 2 diabetes mellitus with circulatory disorder, with long-term current use of insulin ([place]) 11/28/2015  . Lumbar radiculopathy 10/09/2015  . Neuropathy, diabetic ([place]) 05/22/2015  . H/O diverticulitis of colon 01/31/2015  . AAA (abdominal aortic aneurysm) ([place]) 01/01/2015  . Sinusitis 10/10/2014  . Hip pain, bilateral 07/05/2014  . Palpitations 02/27/2014  . Claudication of lower extremity ([place]) 10/25/2013  . BPH with obstruction/lower urinary tract symptoms 10/25/2013  . Hypertriglyceridemia 08/15/2012  . Melanoma ([place])   . Dizziness   . Renal insufficiency   . Coronary artery disease with exertional angina ([place])   . Hyperlipidemia   . Essential hypertension   . OSA (obstructive sleep apnea)   . Ejection fraction   . SOB (shortness of breath)   . PARESTHESIA 05/30/2009  . EDEMA 01/22/2009  . OTHER ABNORMAL BLOOD CHEMISTRY 04/25/2008  . Gout 10/21/2007  . DEPRESSION 10/21/2007  . VENTRAL HERNIA 10/21/2007  . HIATAL HERNIA 10/21/2007  . FATIGUE 10/21/2007  . SKIN CANCER, HX OF 10/21/2007  . NEPHROLITHIASIS, HX OF 10/21/2007    Celyn Nilda Simmer PT, MPH  03/09/2018, 4:55 PM  [facility] [place], [place], [address] Phone: [phone]   Fax:  [phone]  Name: RAJENDRA SPILLER MRN: [id number] Date of Birth: December 08, 1940

## 2018-03-09 NOTE — Patient Instructions (Signed)

## 2018-03-11 ENCOUNTER — Encounter: Payer: Self-pay | Admitting: Rehabilitative and Restorative Service Providers"

## 2018-03-11 ENCOUNTER — Ambulatory Visit: Payer: Medicare HMO | Admitting: Rehabilitative and Restorative Service Providers"

## 2018-03-11 DIAGNOSIS — G8929 Other chronic pain: Secondary | ICD-10-CM | POA: Diagnosis not present

## 2018-03-11 DIAGNOSIS — M6281 Muscle weakness (generalized): Secondary | ICD-10-CM

## 2018-03-11 DIAGNOSIS — R29898 Other symptoms and signs involving the musculoskeletal system: Secondary | ICD-10-CM

## 2018-03-11 DIAGNOSIS — M5441 Lumbago with sciatica, right side: Secondary | ICD-10-CM

## 2018-03-11 DIAGNOSIS — M5442 Lumbago with sciatica, left side: Secondary | ICD-10-CM

## 2018-03-11 NOTE — Therapy (Signed)
[place], [place], [address] Phone: [phone]   Fax:  [phone]  Physical Therapy Treatment  Patient Details  Name: William Hendrix MRN: [id number] Date of Birth: Mar 29, 1941 Referring Provider: [name]   Encounter Date: 03/11/2018  PT End of Session - 03/11/18 1105    Visit Number  8    Number of Visits  12    Date for PT Re-Evaluation  03/29/18    PT Start Time  1103    PT Stop Time  1204    PT Time Calculation (min)  61 min    Activity Tolerance  Patient tolerated treatment well       Past Medical History:  Diagnosis Date  . AAA (abdominal aortic aneurysm) ([place]) 12/15/2014   a. Mild aneurysmal dilatation of the infrarenal abdominal aorta 3.2 cm - f/u due by 2019.  . Arthritis    "all over" (07/30/2016)  . CAD (coronary artery disease)    a. stent to LAD 2000. b. possible spasm by cath 2001. c. IVUS/PTCA/DES to mLAD 05/2013. d. PTCA/DES of dRCA into ostial rPDA 07/2013. e. PTCA of OM2, DES to Cx, DES to [facility] 07/2016.  Marland Kitchen Chronic bronchitis ([place])   . Chronic diastolic CHF (congestive heart failure) ([place])   . Chronic lower back pain   . Chronic neck pain   . CKD (chronic kidney disease), stage III ([place])   . Diabetic peripheral neuropathy ([place])   . Ejection fraction    55%, 07/2010, mild inferior hypo  . GERD (gastroesophageal reflux disease)   . Gout   . H/O diverticulitis of colon 01/31/2015  . H/O hiatal hernia   . History of blood transfusion ~ 10/1940   "had pneumonia"  . History of kidney stones   . HTN (hypertension)   . Hyperlipidemia   . Melanoma of forearm, left ([place]) 02/2011   with wide excision   . Myocardial infarction ([place])   . Nephrolithiasis   . Obesity   . OSA on CPAP     [name] since 2000  . Peripheral neuropathy    both feet  . Positive D-dimer    a. significant elevation ,hospital 07/2010, etiology unclear. b. D Dimer chronically > 20.  . Skin  cancer   . Spinal stenosis    a. s/p surgical repair 2013  . Spinal stenosis of lumbar region   . Type II diabetes mellitus ([place])   . Ventral hernia     Past Surgical History:  Procedure Laterality Date  . ANTERIOR LAT LUMBAR FUSION  10/22/2017   Procedure: Lumbar two-three Lumbar three-four Lumbar four-five Anteriolateral lumbar interbody fusion with percutaneous pedicle screw fixation and infuse;  Surgeon: [name], MD;  Location: [place];  Service: Neurosurgery;;  . APPLICATION OF ROBOTIC ASSISTANCE FOR SPINAL PROCEDURE  10/22/2017   Procedure: APPLICATION OF ROBOTIC ASSISTANCE FOR SPINAL PROCEDURE;  Surgeon: [name], MD;  Location: [place];  Service: Neurosurgery;;  . BACK SURGERY    . CARDIAC CATHETERIZATION N/A 07/30/2016   Procedure: Left Heart Cath and Coronary Angiography;  Surgeon: Nelva Bush, MD;  Location: [place] CV LAB;  Service: Cardiovascular;  Laterality: N/A;  . CARDIAC CATHETERIZATION N/A 07/30/2016   Procedure: Coronary Stent Intervention;  Surgeon: Nelva Bush, MD;  Location: [place] CV LAB;  Service: Cardiovascular;  Laterality: N/A;  Mid CFX and MID RCA  . CARDIAC CATHETERIZATION N/A 07/30/2016   Procedure: Coronary Balloon Angioplasty;  Surgeon: Nelva Bush, MD;  Location: [place] CV LAB;  Service: Cardiovascular;  Laterality: N/A;  OM 1  . CARPAL TUNNEL RELEASE Left   . CATARACT EXTRACTION W/ INTRAOCULAR LENS  IMPLANT, BILATERAL Bilateral ~ 2014  . CERVICAL DISC SURGERY  1990s  . CORONARY ANGIOPLASTY WITH STENT PLACEMENT  05/2013  . CORONARY ANGIOPLASTY WITH STENT PLACEMENT  07/26/2013   DES to RCA extending to PDA    . CORONARY ANGIOPLASTY WITH STENT PLACEMENT  07/30/2016  . CORONARY ANGIOPLASTY WITH STENT PLACEMENT  2000   CAD  . DENTAL SURGERY  04/2016   "got infected; had to dig it out"  . EYE SURGERY    . KNEE ARTHROSCOPY Right 1990's   rt  . LEFT AND RIGHT HEART CATHETERIZATION WITH CORONARY ANGIOGRAM N/A 07/26/2013    Procedure: LEFT AND RIGHT HEART CATHETERIZATION WITH CORONARY ANGIOGRAM;  Surgeon: [name], MD;  Location: [facility] CATH LAB;  Service: Cardiovascular;  Laterality: N/A;  . LEFT HEART CATH AND CORONARY ANGIOGRAPHY N/A 02/26/2017   Procedure: Left Heart Cath and Coronary Angiography;  Surgeon: Sherren Mocha, MD;  Location: [place] [facility];  Service: Cardiovascular;  Laterality: N/A;  . LEFT HEART CATHETERIZATION WITH CORONARY ANGIOGRAM N/A 05/19/2013   Procedure: LEFT HEART CATHETERIZATION WITH CORONARY ANGIOGRAM;  Surgeon: [name], MD;  Location: [facility] CATH LAB;  Service: Cardiovascular;  Laterality: N/A;  . LUMBAR LAMINECTOMY/DECOMPRESSION MICRODISCECTOMY  08/30/2012   Procedure: LUMBAR LAMINECTOMY/DECOMPRESSION MICRODISCECTOMY 2 LEVELS;  Surgeon: [name], MD;  Location: [place] NEURO ORS;  Service: Neurosurgery;  Laterality: Bilateral;  Bilateral Lumbar three-four Lumbar four-five Laminotomies  . LUMBAR PERCUTANEOUS PEDICLE SCREW 1 LEVEL Bilateral 10/22/2017   Procedure: LUMBAR PERCUTANEOUS PEDICLE SCREW 1 LEVEL;  Surgeon: [name], MD;  Location: [place];  Service: Neurosurgery;  Laterality: Bilateral;  . LUMBAR SPINE SURGERY  04/07/2016   Dr. Ellene Route; "?ruptured disc"  . MELANOMA EXCISION Left 02/2011   forearm  . NASAL SINUS SURGERY  1970s   "cut windows in sinus pockets"  . PERCUTANEOUS CORONARY STENT INTERVENTION (PCI-S)  05/19/2013   Procedure: PERCUTANEOUS CORONARY STENT INTERVENTION (PCI-S);  Surgeon: [name], MD;  Location: [facility] C[facility] LAB;  Service: Cardiovascular;;  . ULNAR TUNNEL RELEASE Left 04/07/2016   Dr. Ellene Route    There were no vitals filed for this visit.  Subjective Assessment - 03/11/18 1106    Subjective  patient reports that his legs have been sore following dry needling but he thinks that it did loosen up his legs some. He has increased pain in the hips and Rt LB today.     Currently in Pain?  Yes    Pain Score  7     Pain Location  Back    Pain  Orientation  Right    Pain Descriptors / Indicators  Sore;Tightness;Throbbing    Pain Type  Chronic pain    Pain Radiating Towards  across back and into buttocks - both sides     Pain Onset  More than a month ago    Pain Frequency  Constant                       [facility] Adult PT Treatment/Exercise - 03/11/18 0001      Lumbar Exercises: Stretches   Hip Flexor Stretch  Right;Left;3 reps;30 seconds standing reaching back w/ foot resting toe on floor ext hip    Standing Extension  3 reps;5 seconds    Quad Stretch  Right;Left;2 reps;60 seconds pt sidelying topmost LE  resting on bolster PT assist     Gastroc Stretch  Left;Right;2 reps;30 seconds      Lumbar Exercises: Aerobic   Nustep  L4 x 5 min UE 10 PTA present to discuss progress      Lumbar Exercises: Standing   Other Standing Lumbar Exercises  walking laps between exercises/stretching VC for posture and alignment       Lumbar Exercises: Seated   Sit to Stand  -- core engaged x 2-3       Moist Heat Therapy   Number Minutes Moist Heat  15 Minutes    Moist Heat Location  Lumbar Spine bilat quads       Electrical Stimulation   Electrical Stimulation Location  bilat quads    Electrical Stimulation Action  Premod    Electrical Stimulation Parameters  to tolerance    Electrical Stimulation Goals  Pain;Tone                  PT Long Term Goals - 03/09/18 1653      PT LONG TERM GOAL #1   Title  Improve LE mobility and ROM to WFL's thus decreasing muscular tightness and cramping/pain 03/29/18    Time  6    Period  Weeks    Status  On-going      PT LONG TERM GOAL #2   Title  Increase strength bilat LE's to 4+/5 to 5-/5 throughout 03/29/18    Time  6    Period  Weeks    Status  On-going      PT LONG TERM GOAL #3   Title  Progress with ADL's and functional activities with plan to return to boating and fishing 03/29/18    Time  6    Period  Weeks    Status  On-going      PT LONG TERM GOAL #4   Title   Independent in HEP 03/19/18    Time  6    Period  Weeks    Status  On-going      PT LONG TERM GOAL #5   Title  Improve FOTO to </= 56% limitation 03/29/18    Time  6    Period  Weeks    Status  On-going            Plan - 03/11/18 1107    Clinical Impression Statement  Patient reports that he has had increased soreness in quads following DN. He does notice that the legs are not as tight following needling. Continues to have tightness and pain through the LB and into the buttocks.     Rehab Potential  Good    PT Frequency  2x / week    PT Duration  6 weeks    PT Treatment/Interventions  Patient/family education;ADLs/Self Care Home Management;Cryotherapy;Electrical Stimulation;Iontophoresis 4mg /ml Dexamethasone;Moist Heat;Ultrasound;Dry needling;Manual techniques;Neuromuscular re-education;Gait training;Functional mobility training;Therapeutic activities;Therapeutic exercise;Balance training    PT Next Visit Plan  continue progressing core stabilization and stretching for LE. Add bridging to HEP.  continue with DN as indicated     Consulted and Agree with Plan of Care  Patient       Patient will benefit from skilled therapeutic intervention in order to improve the following deficits and impairments:  Postural dysfunction, Improper body mechanics, Pain, Increased fascial restricitons, Increased muscle spasms, Decreased mobility, Decreased range of motion, Decreased strength, Decreased activity tolerance  Visit Diagnosis: Chronic bilateral low back pain with bilateral sciatica  Muscle weakness (generalized)  Other symptoms and signs  involving the musculoskeletal system     Problem List Patient Active Problem List   Diagnosis Date Noted  . Anemia 11/07/2017  . Constipation 11/07/2017  . Lumbar stenosis with neurogenic claudication 10/22/2017  . Back pain 08/29/2017  . Right-sided low back pain with right-sided sciatica 11/22/2016  . Pressure injury of skin 08/03/2016  .  Dyspnea 08/01/2016  . NSTEMI (non-ST elevated myocardial infarction) ([place]) 07/30/2016  . CAD in native artery   . Obesity   . Uncontrolled type 2 diabetes mellitus with circulatory disorder, with long-term current use of insulin ([place]) 11/28/2015  . Lumbar radiculopathy 10/09/2015  . Neuropathy, diabetic ([place]) 05/22/2015  . H/O diverticulitis of colon 01/31/2015  . AAA (abdominal aortic aneurysm) ([place]) 01/01/2015  . Sinusitis 10/10/2014  . Hip pain, bilateral 07/05/2014  . Palpitations 02/27/2014  . Claudication of lower extremity ([place]) 10/25/2013  . BPH with obstruction/lower urinary tract symptoms 10/25/2013  . Hypertriglyceridemia 08/15/2012  . Melanoma ([place])   . Dizziness   . Renal insufficiency   . Coronary artery disease with exertional angina ([place])   . Hyperlipidemia   . Essential hypertension   . OSA (obstructive sleep apnea)   . Ejection fraction   . SOB (shortness of breath)   . PARESTHESIA 05/30/2009  . EDEMA 01/22/2009  . OTHER ABNORMAL BLOOD CHEMISTRY 04/25/2008  . Gout 10/21/2007  . DEPRESSION 10/21/2007  . VENTRAL HERNIA 10/21/2007  . HIATAL HERNIA 10/21/2007  . FATIGUE 10/21/2007  . SKIN CANCER, HX OF 10/21/2007  . NEPHROLITHIASIS, HX OF 10/21/2007    Shelia Magallon Nilda Simmer PT, MPH  03/11/2018, 1:39 PM  [facility] [place], [place], [address] Phone: [phone]   Fax:  [phone]  Name: William Hendrix MRN: [id number] Date of Birth: July 13, 1941

## 2018-03-14 ENCOUNTER — Ambulatory Visit: Payer: Medicare HMO | Admitting: Rehabilitative and Restorative Service Providers"

## 2018-03-14 ENCOUNTER — Encounter: Payer: Self-pay | Admitting: Rehabilitative and Restorative Service Providers"

## 2018-03-14 DIAGNOSIS — R29898 Other symptoms and signs involving the musculoskeletal system: Secondary | ICD-10-CM

## 2018-03-14 DIAGNOSIS — M5441 Lumbago with sciatica, right side: Secondary | ICD-10-CM

## 2018-03-14 DIAGNOSIS — M6281 Muscle weakness (generalized): Secondary | ICD-10-CM | POA: Diagnosis not present

## 2018-03-14 DIAGNOSIS — G8929 Other chronic pain: Secondary | ICD-10-CM | POA: Diagnosis not present

## 2018-03-14 DIAGNOSIS — M5442 Lumbago with sciatica, left side: Secondary | ICD-10-CM

## 2018-03-14 NOTE — Therapy (Signed)
[place], [place], [address] Phone: [phone]   Fax:  [phone]  Physical Therapy Treatment  Patient Details  Name: William Hendrix MRN: [id number] Date of Birth: October 22, 1940 Referring Provider: Dr Penni Homans   Encounter Date: 03/14/2018  PT End of Session - 03/14/18 1026    Visit Number  9    Number of Visits  12    Date for PT Re-Evaluation  03/29/18    PT Start Time  1020    PT Stop Time  1117    PT Time Calculation (min)  57 min    Activity Tolerance  Patient tolerated treatment well       Past Medical History:  Diagnosis Date  . AAA (abdominal aortic aneurysm) ([place]) 12/15/2014   a. Mild aneurysmal dilatation of the infrarenal abdominal aorta 3.2 cm - f/u due by 2019.  . Arthritis    "all over" (07/30/2016)  . CAD (coronary artery disease)    a. stent to LAD 2000. b. possible spasm by cath 2001. c. IVUS/PTCA/DES to mLAD 05/2013. d. PTCA/DES of dRCA into ostial rPDA 07/2013. e. PTCA of OM2, DES to Cx, DES to [facility] 07/2016.  Marland Kitchen Chronic bronchitis ([place])   . Chronic diastolic CHF (congestive heart failure) ([place])   . Chronic lower back pain   . Chronic neck pain   . CKD (chronic kidney disease), stage III ([place])   . Diabetic peripheral neuropathy ([place])   . Ejection fraction    55%, 07/2010, mild inferior hypo  . GERD (gastroesophageal reflux disease)   . Gout   . H/O diverticulitis of colon 01/31/2015  . H/O hiatal hernia   . History of blood transfusion ~ 10/1940   "had pneumonia"  . History of kidney stones   . HTN (hypertension)   . Hyperlipidemia   . Melanoma of forearm, left ([place]) 02/2011   with wide excision   . Myocardial infarction ([place])   . Nephrolithiasis   . Obesity   . OSA on CPAP     [name] since 2000  . Peripheral neuropathy    both feet  . Positive D-dimer    a. significant elevation ,hospital 07/2010, etiology unclear. b. D Dimer chronically > 20.  . Skin cancer    . Spinal stenosis    a. s/p surgical repair 2013  . Spinal stenosis of lumbar region   . Type II diabetes mellitus ([place])   . Ventral hernia     Past Surgical History:  Procedure Laterality Date  . ANTERIOR LAT LUMBAR FUSION  10/22/2017   Procedure: Lumbar two-three Lumbar three-four Lumbar four-five Anteriolateral lumbar interbody fusion with percutaneous pedicle screw fixation and infuse;  Surgeon: [name], MD;  Location: [facility];  Service: Neurosurgery;;  . APPLICATION OF ROBOTIC ASSISTANCE FOR SPINAL PROCEDURE  10/22/2017   Procedure: APPLICATION OF ROBOTIC ASSISTANCE FOR SPINAL PROCEDURE;  Surgeon: [name], MD;  Location: [place];  Service: Neurosurgery;;  . BACK SURGERY    . CARDIAC CATHETERIZATION N/A 07/30/2016   Procedure: Left Heart Cath and Coronary Angiography;  Surgeon: Nelva Bush, MD;  Location: [place] CV LAB;  Service: Cardiovascular;  Laterality: N/A;  . CARDIAC CATHETERIZATION N/A 07/30/2016   Procedure: Coronary Stent Intervention;  Surgeon: Nelva Bush, MD;  Location: [place] CV LAB;  Service: Cardiovascular;  Laterality: N/A;  Mid CFX and MID RCA  . CARDIAC CATHETERIZATION N/A 07/30/2016   Procedure: Coronary Balloon Angioplasty;  Surgeon: Nelva Bush, MD;  Location: [place] CV LAB;  Service: Cardiovascular;  Laterality: N/A;  OM 1  . CARPAL TUNNEL RELEASE Left   . CATARACT EXTRACTION W/ INTRAOCULAR LENS  IMPLANT, BILATERAL Bilateral ~ 2014  . CERVICAL DISC SURGERY  1990s  . CORONARY ANGIOPLASTY WITH STENT PLACEMENT  05/2013  . CORONARY ANGIOPLASTY WITH STENT PLACEMENT  07/26/2013   DES to RCA extending to PDA    . CORONARY ANGIOPLASTY WITH STENT PLACEMENT  07/30/2016  . CORONARY ANGIOPLASTY WITH STENT PLACEMENT  2000   CAD  . DENTAL SURGERY  04/2016   "got infected; had to dig it out"  . EYE SURGERY    . KNEE ARTHROSCOPY Right 1990's   rt  . LEFT AND RIGHT HEART CATHETERIZATION WITH CORONARY ANGIOGRAM N/A 07/26/2013    Procedure: LEFT AND RIGHT HEART CATHETERIZATION WITH CORONARY ANGIOGRAM;  Surgeon: [name], MD;  Location: [place] CATH LAB;  Service: Cardiovascular;  Laterality: N/A;  . LEFT HEART CATH AND CORONARY ANGIOGRAPHY N/A 02/26/2017   Procedure: Left Heart Cath and Coronary Angiography;  Surgeon: Sherren Mocha, MD;  Location: [place] CV LAB;  Service: Cardiovascular;  Laterality: N/A;  . LEFT HEART CATHETERIZATION WITH CORONARY ANGIOGRAM N/A 05/19/2013   Procedure: LEFT HEART CATHETERIZATION WITH CORONARY ANGIOGRAM;  Surgeon: [name], MD;  Location: [facility] CATH LAB;  Service: Cardiovascular;  Laterality: N/A;  . LUMBAR LAMINECTOMY/DECOMPRESSION MICRODISCECTOMY  08/30/2012   Procedure: LUMBAR LAMINECTOMY/DECOMPRESSION MICRODISCECTOMY 2 LEVELS;  Surgeon: [name], MD;  Location: [place] NEURO ORS;  Service: Neurosurgery;  Laterality: Bilateral;  Bilateral Lumbar three-four Lumbar four-five Laminotomies  . LUMBAR PERCUTANEOUS PEDICLE SCREW 1 LEVEL Bilateral 10/22/2017   Procedure: LUMBAR PERCUTANEOUS PEDICLE SCREW 1 LEVEL;  Surgeon: [name], MD;  Location: [place];  Service: Neurosurgery;  Laterality: Bilateral;  . LUMBAR SPINE SURGERY  04/07/2016   Dr. Ellene Route; "?ruptured disc"  . MELANOMA EXCISION Left 02/2011   forearm  . NASAL SINUS SURGERY  1970s   "cut windows in sinus pockets"  . PERCUTANEOUS CORONARY STENT INTERVENTION (PCI-S)  05/19/2013   Procedure: PERCUTANEOUS CORONARY STENT INTERVENTION (PCI-S);  Surgeon: [name], MD;  Location: [facility] CATH LAB;  Service: Cardiovascular;;  . ULNAR TUNNEL RELEASE Left 04/07/2016   Dr. Ellene Route    There were no vitals filed for this visit.  Subjective Assessment - 03/14/18 1027    Subjective  Sore and hurting in the Rt hip and Rt quad - tight in a couple of places in the Lt quad today as well.     Currently in Pain?  Yes    Pain Score  7     Pain Location  Back    Pain Orientation  Right    Pain Descriptors / Indicators  Sore    Pain Type   Chronic pain    Pain Radiating Towards  across back and into buttocks Rt side; Lt lateral/anterior quad     Pain Onset  More than a month ago    Pain Frequency  Constant         [facility] PT Assessment - 03/14/18 0001      Assessment   Medical Diagnosis  Back pain     Referring Provider  Dr Penni Homans    Onset Date/Surgical Date  10/22/17    Hand Dominance  Right    Next MD Visit  to schedule       AROM   Right/Left Hip  -- tight bilat hips in all planes  Flexibility   Hamstrings  limited knee extension in sitting - due to tight HS     Quadriceps  tight bilat      Palpation   Palpation comment  muscular tightness though lumbar spine into posterior hips Rt > Lt; quads; hamstrings                    [facility] Adult PT Treatment/Exercise - 03/14/18 0001      Lumbar Exercises: Stretches   Gastroc Stretch  Left;Right;2 reps;30 seconds      Lumbar Exercises: Aerobic   Nustep  L5 x 3 min UE 10 PT present to discuss progress; stopped at 3 min d/t pain       Lumbar Exercises: Standing   Other Standing Lumbar Exercises  walking laps between exercises/stretching VC for posture and alignment       Lumbar Exercises: Seated   Sit to Stand  -- core engaged x 2-3       Moist Heat Therapy   Number Minutes Moist Heat  20 Minutes    Moist Heat Location  Lumbar Spine bilat quads       Electrical Stimulation   Electrical Stimulation Location  Rt posterior hip to lateral thigh     Electrical Stimulation Action  IFC    Electrical Stimulation Parameters  to tolerance    Electrical Stimulation Goals  Pain;Tone      Manual Therapy   Manual therapy comments  pt Lt sidelying     Soft tissue mobilization  deep tissue work through Rt posterior lateral hip to Rt quads and ITB        Trigger Point Dry Needling - 03/14/18 1047    Consent Given?  Yes    Muscles Treated Lower Body  -- Rt with estim     Gluteus Maximus Response  Palpable increased muscle length    Piriformis  Response  Palpable increased muscle length    Tensor Fascia Lata Response  Palpable increased muscle length                PT Long Term Goals - 03/14/18 1026      PT LONG TERM GOAL #1   Title  Improve LE mobility and ROM to WFL's thus decreasing muscular tightness and cramping/pain 03/29/18    Time  6    Period  Weeks    Status  On-going      PT LONG TERM GOAL #2   Title  Increase strength bilat LE's to 4+/5 to 5-/5 throughout 03/29/18    Time  6    Period  Weeks    Status  On-going      PT LONG TERM GOAL #3   Title  Progress with ADL's and functional activities with plan to return to boating and fishing 03/29/18    Time  6    Period  Weeks    Status  On-going      PT LONG TERM GOAL #4   Title  Independent in HEP 03/19/18    Time  6    Period  Weeks    Status  On-going      PT LONG TERM GOAL #5   Title  Improve FOTO to </= 56% limitation 03/29/18    Time  6    Period  Weeks    Status  On-going            Plan - 03/14/18 1101    Clinical Impression Statement  Continued pain in  Rt posterior lateral hip into the Rt lateral and anterior thigh as well as some pain and tightness in the Lt LE. Patient has increased activity level and is now wmbulating without assistive device. He is making gains functionally although the pain in the LB; hips and thighs continues. Patient is gradually progressing toward stated goals of therapy.     Rehab Potential  Good    PT Frequency  2x / week    PT Duration  6 weeks    PT Treatment/Interventions  Patient/family education;ADLs/Self Care Home Management;Cryotherapy;Electrical Stimulation;Iontophoresis 4mg /ml Dexamethasone;Moist Heat;Ultrasound;Dry needling;Manual techniques;Neuromuscular re-education;Gait training;Functional mobility training;Therapeutic activities;Therapeutic exercise;Balance training    PT Next Visit Plan  continue progressing core stabilization and stretching for LE. Add bridging to HEP.  assess response to DN  through the Rt posterior hip and ITB    Consulted and Agree with Plan of Care  Patient       Patient will benefit from skilled therapeutic intervention in order to improve the following deficits and impairments:  Postural dysfunction, Improper body mechanics, Pain, Increased fascial restricitons, Increased muscle spasms, Decreased mobility, Decreased range of motion, Decreased strength, Decreased activity tolerance  Visit Diagnosis: Chronic bilateral low back pain with bilateral sciatica  Muscle weakness (generalized)  Other symptoms and signs involving the musculoskeletal system     Problem List Patient Active Problem List   Diagnosis Date Noted  . Anemia 11/07/2017  . Constipation 11/07/2017  . Lumbar stenosis with neurogenic claudication 10/22/2017  . Back pain 08/29/2017  . Right-sided low back pain with right-sided sciatica 11/22/2016  . Pressure injury of skin 08/03/2016  . Dyspnea 08/01/2016  . NSTEMI (non-ST elevated myocardial infarction) ([place]) 07/30/2016  . CAD in native artery   . Obesity   . Uncontrolled type 2 diabetes mellitus with circulatory disorder, with long-term current use of insulin ([place]) 11/28/2015  . Lumbar radiculopathy 10/09/2015  . Neuropathy, diabetic ([place]) 05/22/2015  . H/O diverticulitis of colon 01/31/2015  . AAA (abdominal aortic aneurysm) ([place]) 01/01/2015  . Sinusitis 10/10/2014  . Hip pain, bilateral 07/05/2014  . Palpitations 02/27/2014  . Claudication of lower extremity ([place]) 10/25/2013  . BPH with obstruction/lower urinary tract symptoms 10/25/2013  . Hypertriglyceridemia 08/15/2012  . Melanoma ([place])   . Dizziness   . Renal insufficiency   . Coronary artery disease with exertional angina ([place])   . Hyperlipidemia   . Essential hypertension   . OSA (obstructive sleep apnea)   . Ejection fraction   . SOB (shortness of breath)   . PARESTHESIA 05/30/2009  . EDEMA 01/22/2009  . OTHER ABNORMAL BLOOD CHEMISTRY 04/25/2008  . Gout  10/21/2007  . DEPRESSION 10/21/2007  . VENTRAL HERNIA 10/21/2007  . HIATAL HERNIA 10/21/2007  . FATIGUE 10/21/2007  . SKIN CANCER, HX OF 10/21/2007  . NEPHROLITHIASIS, HX OF 10/21/2007    Layliana Devins Nilda Simmer PT, MPH  03/14/2018, 11:05 AM  [facility] [place], [place], [address] Phone: [phone]   Fax:  [phone]  Name: ZEKIEL TORIAN MRN: [id number] Date of Birth: 05-Jun-1941

## 2018-03-17 ENCOUNTER — Ambulatory Visit: Payer: Medicare HMO | Admitting: Rehabilitative and Restorative Service Providers"

## 2018-03-17 ENCOUNTER — Encounter: Payer: Self-pay | Admitting: Rehabilitative and Restorative Service Providers"

## 2018-03-17 DIAGNOSIS — M5442 Lumbago with sciatica, left side: Secondary | ICD-10-CM | POA: Diagnosis not present

## 2018-03-17 DIAGNOSIS — G8929 Other chronic pain: Secondary | ICD-10-CM

## 2018-03-17 DIAGNOSIS — M6281 Muscle weakness (generalized): Secondary | ICD-10-CM

## 2018-03-17 DIAGNOSIS — M5441 Lumbago with sciatica, right side: Secondary | ICD-10-CM | POA: Diagnosis not present

## 2018-03-17 DIAGNOSIS — R29898 Other symptoms and signs involving the musculoskeletal system: Secondary | ICD-10-CM | POA: Diagnosis not present

## 2018-03-17 NOTE — Therapy (Signed)
[facility] [place] [address] [address] [place], [place], [address] Phone: [phone]   Fax:  [phone]  Physical Therapy Treatment  Patient Details  Name: William Hendrix MRN: [id number] Date of Birth: [birth year] Referring Provider: Dr Penni Homans   Encounter Date: 03/17/2018  PT End of Session - 03/17/18 1022    Visit Number  10    Number of Visits  12    Date for PT Re-Evaluation  03/29/18    PT Start Time  1018    PT Stop Time  1116    PT Time Calculation (min)  58 min    Activity Tolerance  Patient tolerated treatment well       Past Medical History:  Diagnosis Date  . AAA (abdominal aortic aneurysm) ([place]) 12/15/2014   a. Mild aneurysmal dilatation of the infrarenal abdominal aorta 3.2 cm - f/u due by 2019.  . Arthritis    "all over" (07/30/2016)  . CAD (coronary artery disease)    a. stent to LAD 2000. b. possible spasm by cath 2001. c. IVUS/PTCA/DES to mLAD 05/2013. d. PTCA/DES of dRCA into ostial rPDA 07/2013. e. PTCA of OM2, DES to Cx, DES to [facility] 07/2016.  Marland Kitchen Chronic bronchitis ([place])   . Chronic diastolic CHF (congestive heart failure) ([place])   . Chronic lower back pain   . Chronic neck pain   . CKD (chronic kidney disease), stage III ([place])   . Diabetic peripheral neuropathy ([place])   . Ejection fraction    55%, 07/2010, mild inferior hypo  . GERD (gastroesophageal reflux disease)   . Gout   . H/O diverticulitis of colon 01/31/2015  . H/O hiatal hernia   . History of blood transfusion ~ 10/1940   "had pneumonia"  . History of kidney stones   . HTN (hypertension)   . Hyperlipidemia   . Melanoma of forearm, left ([place]) 02/2011   with wide excision   . Myocardial infarction ([place])   . Nephrolithiasis   . Obesity   . OSA on CPAP     [name] since 2000  . Peripheral neuropathy    both feet  . Positive D-dimer    a. significant elevation ,hospital 07/2010, etiology unclear. b. D Dimer chronically > 20.  . Skin  cancer   . Spinal stenosis    a. s/p surgical repair 2013  . Spinal stenosis of lumbar region   . Type II diabetes mellitus ([place])   . Ventral hernia     Past Surgical History:  Procedure Laterality Date  . ANTERIOR LAT LUMBAR FUSION  10/22/2017   Procedure: Lumbar two-three Lumbar three-four Lumbar four-five Anteriolateral lumbar interbody fusion with percutaneous pedicle screw fixation and infuse;  Surgeon: [name], MD;  Location: [facility];  Service: Neurosurgery;;  . APPLICATION OF ROBOTIC ASSISTANCE FOR SPINAL PROCEDURE  10/22/2017   Procedure: APPLICATION OF ROBOTIC ASSISTANCE FOR SPINAL PROCEDURE;  Surgeon: [name], MD;  Location: [facility];  Service: Neurosurgery;;  . BACK SURGERY    . CARDIAC CATHETERIZATION N/A 07/30/2016   Procedure: Left Heart Cath and Coronary Angiography;  Surgeon: Nelva Bush, MD;  Location: [facility] CV LAB;  Service: Cardiovascular;  Laterality: N/A;  . CARDIAC CATHETERIZATION N/A 07/30/2016   Procedure: Coronary Stent Intervention;  Surgeon: Nelva Bush, MD;  Location: [facility] CV LAB;  Service: Cardiovascular;  Laterality: N/A;  Mid CFX and MID RCA  . CARDIAC CATHETERIZATION N/A 07/30/2016   Procedure: Coronary Balloon Angioplasty;  Surgeon: Nelva Bush, MD;  Location: [facility] CV LAB;  Service: Cardiovascular;  Laterality: N/A;  OM 1  . CARPAL TUNNEL RELEASE Left   . CATARACT EXTRACTION W/ INTRAOCULAR LENS  IMPLANT, BILATERAL Bilateral ~ 2014  . CERVICAL DISC SURGERY  1990s  . CORONARY ANGIOPLASTY WITH STENT PLACEMENT  05/2013  . CORONARY ANGIOPLASTY WITH STENT PLACEMENT  07/26/2013   DES to RCA extending to PDA    . CORONARY ANGIOPLASTY WITH STENT PLACEMENT  07/30/2016  . CORONARY ANGIOPLASTY WITH STENT PLACEMENT  2000   CAD  . DENTAL SURGERY  04/2016   "got infected; had to dig it out"  . EYE SURGERY    . KNEE ARTHROSCOPY Right 1990's   rt  . LEFT AND RIGHT HEART CATHETERIZATION WITH CORONARY ANGIOGRAM N/A 07/26/2013    Procedure: LEFT AND RIGHT HEART CATHETERIZATION WITH CORONARY ANGIOGRAM;  Surgeon: [name], MD;  Location: [facility] CATH LAB;  Service: Cardiovascular;  Laterality: N/A;  . LEFT HEART CATH AND CORONARY ANGIOGRAPHY N/A 02/26/2017   Procedure: Left Heart Cath and Coronary Angiography;  Surgeon: Sherren Mocha, MD;  Location: [place] CV LAB;  Service: Cardiovascular;  Laterality: N/A;  . LEFT HEART CATHETERIZATION WITH CORONARY ANGIOGRAM N/A 05/19/2013   Procedure: LEFT HEART CATHETERIZATION WITH CORONARY ANGIOGRAM;  Surgeon: [name], MD;  Location: [facility] C[facility] LAB;  Service: Cardiovascular;  Laterality: N/A;  . LUMBAR LAMINECTOMY/DECOMPRESSION MICRODISCECTOMY  08/30/2012   Procedure: LUMBAR LAMINECTOMY/DECOMPRESSION MICRODISCECTOMY 2 LEVELS;  Surgeon: [name], MD;  Location: [facility]URO ORS;  Service: Neurosurgery;  Laterality: Bilateral;  Bilateral Lumbar three-four Lumbar four-five Laminotomies  . LUMBAR PERCUTANEOUS PEDICLE SCREW 1 LEVEL Bilateral 10/22/2017   Procedure: LUMBAR PERCUTANEOUS PEDICLE SCREW 1 LEVEL;  Surgeon: [name], MD;  Location: [facility];  Service: Neurosurgery;  Laterality: Bilateral;  . LUMBAR SPINE SURGERY  04/07/2016   Dr. Ellene Route; "?ruptured disc"  . MELANOMA EXCISION Left 02/2011   forearm  . NASAL SINUS SURGERY  1970s   "cut windows in sinus pockets"  . PERCUTANEOUS CORONARY STENT INTERVENTION (PCI-S)  05/19/2013   Procedure: PERCUTANEOUS CORONARY STENT INTERVENTION (PCI-S);  Surgeon: [name], MD;  Location: [facility] CATH LAB;  Service: Cardiovascular;;  . ULNAR TUNNEL RELEASE Left 04/07/2016   Dr. Ellene Route    There were no vitals filed for this visit.  Subjective Assessment - 03/17/18 1022    Subjective  [name] reports that the DN helped loosen the Rt hip and thigh up some. Would like to try it again and also try doing the Lt hip too.     Currently in Pain?  Yes    Pain Score  6     Pain Location  Back    Pain Orientation  Right    Pain Descriptors /  Indicators  Sore    Pain Type  Chronic pain    Pain Onset  More than a month ago    Pain Frequency  Constant    Aggravating Factors   prolonged sitting; standing; walking; any bending    Pain Relieving Factors  meds; TENS; lying in bed with legs elevated          [facility] PT Assessment - 03/17/18 0001      Observation/Other Assessments   Focus on Therapeutic Outcomes (FOTO)   62% limitation      AROM   Right/Left Hip  -- tight bilat in all planes      Strength   Overall Strength Comments  assessed in sitting     Right Hip Flexion  4-/5    Right Hip Extension  4/5    Right Hip ABduction  4/5    Left Hip Flexion  4/5    Left Hip Extension  4/5    Left Hip ABduction  4/5    Right Knee Flexion  4+/5    Right Knee Extension  4/5    Left Knee Flexion  4+/5    Left Knee Extension  4/5      Flexibility   Quadriceps  prone knee flexion ~ 90 deg bilat       Palpation   Palpation comment  muscular tightness though lumbar spine into posterior hips Rt > Lt; quads; hamstrings       Transfers   Comments  patient able to move into prone position; remained prone for 15-20 min; transitioned to sitting independently - moving in and out of supine; sidelying; standing with decreased difficulty       Ambulation/Gait   Gait Comments  ambulates w/out assistive device - less flexed forward at trunk/hips; good stride length; no significant limp; improving gait pattern                   [facility] Adult PT Treatment/Exercise - 03/17/18 0001      Lumbar Exercises: Aerobic   Nustep  L5 X 2 min decreasing resistance to L4 x 3 min  PT present to discuss progress; stopped at 3 min d/t pain       Lumbar Exercises: Standing   Other Standing Lumbar Exercises  walking laps between exercises/stretching VC for posture and alignment       Lumbar Exercises: Seated   Sit to Stand  -- core engaged x 2-3       Lumbar Exercises: Prone   Other Prone Lumbar Exercises  patient able to lie prone for  treatment for the first time - improving transitional movements       Moist Heat Therapy   Number Minutes Moist Heat  20 Minutes    Moist Heat Location  Lumbar Spine bilat quads       Electrical Stimulation   Electrical Stimulation Location  Rt/Lt posterior hip to lateral thigh     Electrical Stimulation Action  pre mod    Electrical Stimulation Parameters  to tolerance    Electrical Stimulation Goals  Pain;Tone      Manual Therapy   Manual therapy comments  pt prone     Soft tissue mobilization  deep tissue work through Rt/Lt posterior lateral hip and ITB        Trigger Point Dry Needling - 03/17/18 1112    Consent Given?  Yes    Muscles Treated Lower Body  -- bilat with estim     Gluteus Maximus Response  Palpable increased muscle length    Gluteus Minimus Response  Palpable increased muscle length    Piriformis Response  Palpable increased muscle length    Tensor Fascia Lata Response  Palpable increased muscle length                PT Long Term Goals - 03/17/18 1115      PT LONG TERM GOAL #1   Title  Improve LE mobility and ROM to WFL's thus decreasing muscular tightness and cramping/pain 03/29/18    Time  6    Period  Weeks    Status  Partially Met      PT LONG TERM GOAL #2   Title  Increase strength bilat LE's to 4+/5 to 5-/5  throughout 03/29/18    Time  6    Period  Weeks    Status  Partially Met      PT LONG TERM GOAL #3   Title  Progress with ADL's and functional activities with plan to return to boating and fishing 03/29/18    Time  6    Period  Weeks    Status  Partially Met      PT LONG TERM GOAL #4   Title  Independent in HEP 03/19/18    Time  6    Period  Weeks    Status  On-going      PT LONG TERM GOAL #5   Title  Improve FOTO to </= 56% limitation 03/29/18    Time  6    Period  Weeks    Status  On-going            Plan - 03/17/18 1112    Clinical Impression Statement  [name] reports good response to DN in Rt posterior hip - felt  looser with less pain the next day following DN. He was able to lie prone for 15-20 minutes for treatment today. Note significant improvement in transitional movements and transfers. Patient was unable to lie down at all the first few PT treatments. He is now transferring independently. Functional activity level is improving. [name] is gradually progressing towards goals of therapy.     Rehab Potential  Good    PT Frequency  2x / week    PT Duration  6 weeks    PT Treatment/Interventions  Patient/family education;ADLs/Self Care Home Management;Cryotherapy;Electrical Stimulation;Iontophoresis 53m/ml Dexamethasone;Moist Heat;Ultrasound;Dry needling;Manual techniques;Neuromuscular re-education;Gait training;Functional mobility training;Therapeutic activities;Therapeutic exercise;Balance training    PT Next Visit Plan  continue progressing core stabilization and stretching for LE. Add bridging to HEP.  assess response to DN through thebilat posterior hip and ITB    Consulted and Agree with Plan of Care  Patient       Patient will benefit from skilled therapeutic intervention in order to improve the following deficits and impairments:  Postural dysfunction, Improper body mechanics, Pain, Increased fascial restricitons, Increased muscle spasms, Decreased mobility, Decreased range of motion, Decreased strength, Decreased activity tolerance  Visit Diagnosis: Chronic bilateral low back pain with bilateral sciatica  Muscle weakness (generalized)  Other symptoms and signs involving the musculoskeletal system     Problem List Patient Active Problem List   Diagnosis Date Noted  . Anemia 11/07/2017  . Constipation 11/07/2017  . Lumbar stenosis with neurogenic claudication 10/22/2017  . Back pain 08/29/2017  . Right-sided low back pain with right-sided sciatica 11/22/2016  . Pressure injury of skin 08/03/2016  . Dyspnea 08/01/2016  . NSTEMI (non-ST elevated myocardial infarction) ([place] 07/30/2016  .  CAD in native artery   . Obesity   . Uncontrolled type 2 diabetes mellitus with circulatory disorder, with long-term current use of insulin ([place] 11/28/2015  . Lumbar radiculopathy 10/09/2015  . Neuropathy, diabetic ([place] 05/22/2015  . H/O diverticulitis of colon 01/31/2015  . AAA (abdominal aortic aneurysm) ([place] 01/01/2015  . Sinusitis 10/10/2014  . Hip pain, bilateral 07/05/2014  . Palpitations 02/27/2014  . Claudication of lower extremity ([place] 10/25/2013  . BPH with obstruction/lower urinary tract symptoms 10/25/2013  . Hypertriglyceridemia 08/15/2012  . Melanoma ([place]   . Dizziness   . Renal insufficiency   . Coronary artery disease with exertional angina ([place]   . Hyperlipidemia   . Essential hypertension   . OSA (obstructive sleep apnea)   .  Ejection fraction   . SOB (shortness of breath)   . PARESTHESIA 05/30/2009  . EDEMA 01/22/2009  . OTHER ABNORMAL BLOOD CHEMISTRY 04/25/2008  . Gout 10/21/2007  . DEPRESSION 10/21/2007  . VENTRAL HERNIA 10/21/2007  . HIATAL HERNIA 10/21/2007  . FATIGUE 10/21/2007  . SKIN CANCER, HX OF 10/21/2007  . NEPHROLITHIASIS, HX OF 10/21/2007    Celyn Nilda Simmer  PT, MPH  03/17/2018, 11:17 AM  [facility] [place], [place], [address] Phone: [phone]   Fax:  [phone]  Name: TIBURCIO LINDER MRN: [id number] Date of Birth: 1941/04/14

## 2018-03-22 ENCOUNTER — Other Ambulatory Visit: Payer: Self-pay

## 2018-03-22 ENCOUNTER — Telehealth: Payer: Self-pay | Admitting: Family Medicine

## 2018-03-22 ENCOUNTER — Ambulatory Visit: Payer: Medicare HMO | Admitting: Rehabilitative and Restorative Service Providers"

## 2018-03-22 DIAGNOSIS — M6281 Muscle weakness (generalized): Secondary | ICD-10-CM | POA: Diagnosis not present

## 2018-03-22 DIAGNOSIS — G8929 Other chronic pain: Secondary | ICD-10-CM

## 2018-03-22 DIAGNOSIS — M5441 Lumbago with sciatica, right side: Secondary | ICD-10-CM

## 2018-03-22 DIAGNOSIS — R29898 Other symptoms and signs involving the musculoskeletal system: Secondary | ICD-10-CM | POA: Diagnosis not present

## 2018-03-22 DIAGNOSIS — M5442 Lumbago with sciatica, left side: Secondary | ICD-10-CM | POA: Diagnosis not present

## 2018-03-22 MED ORDER — ALLOPURINOL 300 MG PO TABS: 300.0000 mg | ORAL_TABLET | Freq: Every day | ORAL | 0 refills | Status: DC

## 2018-03-22 NOTE — Therapy (Signed)
[facility] [place], [place], [address] Phone: [phone]   Fax:  [phone]  Physical Therapy Treatment  Patient Details  Name: William Hendrix MRN: [id number] Date of Birth: [birth year] Referring Provider: Dr Penni Homans   Encounter Date: 03/22/2018  PT End of Session - 03/22/18 1108    Visit Number  11    Number of Visits  12    Date for PT Re-Evaluation  03/29/18    PT Start Time  1105    PT Stop Time  1207    PT Time Calculation (min)  62 min    Activity Tolerance  Patient tolerated treatment well       Past Medical History:  Diagnosis Date  . AAA (abdominal aortic aneurysm) ([place]) 12/15/2014   a. Mild aneurysmal dilatation of the infrarenal abdominal aorta 3.2 cm - f/u due by 2019.  . Arthritis    "all over" (07/30/2016)  . CAD (coronary artery disease)    a. stent to LAD 2000. b. possible spasm by cath 2001. c. IVUS/PTCA/DES to mLAD 05/2013. d. PTCA/DES of dRCA into ostial rPDA 07/2013. e. PTCA of OM2, DES to Cx, DES to [facility] 07/2016.  Marland Kitchen Chronic bronchitis ([place])   . Chronic diastolic CHF (congestive heart failure) ([place])   . Chronic lower back pain   . Chronic neck pain   . CKD (chronic kidney disease), stage III ([place])   . Diabetic peripheral neuropathy ([place])   . Ejection fraction    55%, 07/2010, mild inferior hypo  . GERD (gastroesophageal reflux disease)   . Gout   . H/O diverticulitis of colon 01/31/2015  . H/O hiatal hernia   . History of blood transfusion ~ 10/1940   "had pneumonia"  . History of kidney stones   . HTN (hypertension)   . Hyperlipidemia   . Melanoma of forearm, left ([place]) 02/2011   with wide excision   . Myocardial infarction ([place])   . Nephrolithiasis   . Obesity   . OSA on CPAP     [name] since 2000  . Peripheral neuropathy    both feet  . Positive D-dimer    a. significant elevation ,hospital 07/2010, etiology unclear. b. D Dimer chronically > 20.  . Skin  cancer   . Spinal stenosis    a. s/p surgical repair 2013  . Spinal stenosis of lumbar region   . Type II diabetes mellitus ([place])   . Ventral hernia     Past Surgical History:  Procedure Laterality Date  . ANTERIOR LAT LUMBAR FUSION  10/22/2017   Procedure: Lumbar two-three Lumbar three-four Lumbar four-five Anteriolateral lumbar interbody fusion with percutaneous pedicle screw fixation and infuse;  Surgeon: [name], MD;  Location: [facility];  Service: Neurosurgery;;  . APPLICATION OF ROBOTIC ASSISTANCE FOR SPINAL PROCEDURE  10/22/2017   Procedure: APPLICATION OF ROBOTIC ASSISTANCE FOR SPINAL PROCEDURE;  Surgeon: [name], MD;  Location: [facility];  Service: Neurosurgery;;  . BACK SURGERY    . CARDIAC CATHETERIZATION N/A 07/30/2016   Procedure: Left Heart Cath and Coronary Angiography;  Surgeon: Nelva Bush, MD;  Location: [place] [facility] CV LAB;  Service: Cardiovascular;  Laterality: N/A;  . CARDIAC CATHETERIZATION N/A 07/30/2016   Procedure: Coronary Stent Intervention;  Surgeon: Nelva Bush, MD;  Location: [place] CV LAB;  Service: Cardiovascular;  Laterality: N/A;  Mid CFX and MID RCA  . CARDIAC CATHETERIZATION N/A 07/30/2016   Procedure: Coronary Balloon Angioplasty;  Surgeon: Nelva Bush, MD;  Location: [place] [facility] CV LAB;  Service: Cardiovascular;  Laterality: N/A;  OM 1  . CARPAL TUNNEL RELEASE Left   . CATARACT EXTRACTION W/ INTRAOCULAR LENS  IMPLANT, BILATERAL Bilateral ~ 2014  . CERVICAL DISC SURGERY  1990s  . CORONARY ANGIOPLASTY WITH STENT PLACEMENT  05/2013  . CORONARY ANGIOPLASTY WITH STENT PLACEMENT  07/26/2013   DES to RCA extending to PDA    . CORONARY ANGIOPLASTY WITH STENT PLACEMENT  07/30/2016  . CORONARY ANGIOPLASTY WITH STENT PLACEMENT  2000   CAD  . DENTAL SURGERY  04/2016   "got infected; had to dig it out"  . EYE SURGERY    . KNEE ARTHROSCOPY Right 1990's   rt  . LEFT AND RIGHT HEART CATHETERIZATION WITH CORONARY ANGIOGRAM N/A 07/26/2013    Procedure: LEFT AND RIGHT HEART CATHETERIZATION WITH CORONARY ANGIOGRAM;  Surgeon: [name], MD;  Location: [facility] CATH LAB;  Service: Cardiovascular;  Laterality: N/A;  . LEFT HEART CATH AND CORONARY ANGIOGRAPHY N/A 02/26/2017   Procedure: Left Heart Cath and Coronary Angiography;  Surgeon: Sherren Mocha, MD;  Location: [facility] CV LAB;  Service: Cardiovascular;  Laterality: N/A;  . LEFT HEART CATHETERIZATION WITH CORONARY ANGIOGRAM N/A 05/19/2013   Procedure: LEFT HEART CATHETERIZATION WITH CORONARY ANGIOGRAM;  Surgeon: [name], MD;  Location: [facility] CATH LAB;  Service: Cardiovascular;  Laterality: N/A;  . LUMBAR LAMINECTOMY/DECOMPRESSION MICRODISCECTOMY  08/30/2012   Procedure: LUMBAR LAMINECTOMY/DECOMPRESSION MICRODISCECTOMY 2 LEVELS;  Surgeon: [name], MD;  Location: [facility] NEURO ORS;  Service: Neurosurgery;  Laterality: Bilateral;  Bilateral Lumbar three-four Lumbar four-five Laminotomies  . LUMBAR PERCUTANEOUS PEDICLE SCREW 1 LEVEL Bilateral 10/22/2017   Procedure: LUMBAR PERCUTANEOUS PEDICLE SCREW 1 LEVEL;  Surgeon: [name], MD;  Location: [facility];  Service: Neurosurgery;  Laterality: Bilateral;  . LUMBAR SPINE SURGERY  04/07/2016   Dr. Ellene Route; "?ruptured disc"  . MELANOMA EXCISION Left 02/2011   forearm  . NASAL SINUS SURGERY  1970s   "cut windows in sinus pockets"  . PERCUTANEOUS CORONARY STENT INTERVENTION (PCI-S)  05/19/2013   Procedure: PERCUTANEOUS CORONARY STENT INTERVENTION (PCI-S);  Surgeon: [name], MD;  Location: [facility] CATH LAB;  Service: Cardiovascular;;  . ULNAR TUNNEL RELEASE Left 04/07/2016   Dr. Ellene Route    There were no vitals filed for this visit.  Subjective Assessment - 03/22/18 1109    Subjective  Feels he is making some progress. Hurts still - everyday. Feels he needs to work onthe front part of his thighs today - it is really tight.     Currently in Pain?  Yes    Pain Score  6     Pain Location  Hip    Pain Orientation  Right;Left    Pain  Descriptors / Indicators  Tightness;Sore    Pain Type  Chronic pain         [facility] PT Assessment - 03/22/18 0001      Assessment   Medical Diagnosis  Back pain     Referring Provider  Dr Penni Homans    Onset Date/Surgical Date  10/22/17    Hand Dominance  Right    Next MD Visit  to schedule     Prior Therapy  yes in the past for LB - chiropractic care with no improvement - none since surgery       Palpation   Palpation comment  muscular tightness though lumbar spine into posterior hips Rt > Lt; quads; hamstrings       Transfers   Transfer  Cueing  continued improvement in transitional movements       Ambulation/Gait   Gait Comments  ambulates w/out assistive device - less flexed forward at trunk/hips; good stride length; no significant limp; improving gait pattern                   [facility] Adult PT Treatment/Exercise - 03/22/18 0001      Lumbar Exercises: Stretches   Gastroc Stretch  Left;Right;2 reps;30 seconds    Other Lumbar Stretch Exercise  hip flexor stretch standing one foot back - pushing hips forward 30 sec x 3 each LE       Lumbar Exercises: Aerobic   Nustep  L5 x 5 min       Lumbar Exercises: Standing   Other Standing Lumbar Exercises  walking laps between exercises/stretching VC for posture and alignment       Lumbar Exercises: Seated   Sit to Stand  -- core engaged x 2-3       Moist Heat Therapy   Number Minutes Moist Heat  20 Minutes    Moist Heat Location  Lumbar Spine bilat quads       Electrical Stimulation   Electrical Stimulation Location  Rt/Lt anterior lateral quad    Electrical Stimulation Action  pre mod    Electrical Stimulation Parameters  to tolerance    Electrical Stimulation Goals  Pain;Tone      Manual Therapy   Manual therapy comments  pt supine     Soft tissue mobilization  deep tissue work through Rt/Lt anterior lateral quad        Trigger Point Dry Needling - 03/22/18 1132    Consent Given?  Yes    Tensor Fascia Lata  Response  Palpable increased muscle length    Quadriceps Response  Palpable increased muscle length                PT Long Term Goals - 03/22/18 1109      PT LONG TERM GOAL #1   Title  Improve LE mobility and ROM to WFL's thus decreasing muscular tightness and cramping/pain 03/29/18    Time  6    Period  Weeks    Status  Partially Met      PT LONG TERM GOAL #2   Title  Increase strength bilat LE's to 4+/5 to 5-/5 throughout 03/29/18    Time  6    Period  Weeks    Status  Partially Met      PT LONG TERM GOAL #3   Title  Progress with ADL's and functional activities with plan to return to boating and fishing 03/29/18    Time  6    Period  Weeks    Status  Partially Met      PT LONG TERM GOAL #4   Title  Independent in HEP 03/19/18    Time  6    Period  Weeks    Status  On-going      PT LONG TERM GOAL #5   Title  Improve FOTO to </= 56% limitation 03/29/18    Time  6    Period  Weeks    Status  On-going            Plan - 03/22/18 1134    Clinical Impression Statement  Continued gradual improvment with functional abilities and gait. Continued muscular tightness noted in bilat hips and quads. Responding well to DN and manual work as well as stretching  Rehab Potential  Good    PT Frequency  2x / week    PT Duration  6 weeks    PT Treatment/Interventions  Patient/family education;ADLs/Self Care Home Management;Cryotherapy;Electrical Stimulation;Iontophoresis 31m/ml Dexamethasone;Moist Heat;Ultrasound;Dry needling;Manual techniques;Neuromuscular re-education;Gait training;Functional mobility training;Therapeutic activities;Therapeutic exercise;Balance training    PT Next Visit Plan  continue progressing core stabilization and stretching for LE. Add bridging to HEP.  continue DN     Consulted and Agree with Plan of Care  Patient       Patient will benefit from skilled therapeutic intervention in order to improve the following deficits and impairments:  Postural  dysfunction, Improper body mechanics, Pain, Increased fascial restricitons, Increased muscle spasms, Decreased mobility, Decreased range of motion, Decreased strength, Decreased activity tolerance  Visit Diagnosis: Chronic bilateral low back pain with bilateral sciatica  Muscle weakness (generalized)  Other symptoms and signs involving the musculoskeletal system     Problem List Patient Active Problem List   Diagnosis Date Noted  . Anemia 11/07/2017  . Constipation 11/07/2017  . Lumbar stenosis with neurogenic claudication 10/22/2017  . Back pain 08/29/2017  . Right-sided low back pain with right-sided sciatica 11/22/2016  . Pressure injury of skin 08/03/2016  . Dyspnea 08/01/2016  . NSTEMI (non-ST elevated myocardial infarction) ([place] 07/30/2016  . CAD in native artery   . Obesity   . Uncontrolled type 2 diabetes mellitus with circulatory disorder, with long-term current use of insulin ([place] 11/28/2015  . Lumbar radiculopathy 10/09/2015  . Neuropathy, diabetic ([place] 05/22/2015  . H/O diverticulitis of colon 01/31/2015  . AAA (abdominal aortic aneurysm) ([place] 01/01/2015  . Sinusitis 10/10/2014  . Hip pain, bilateral 07/05/2014  . Palpitations 02/27/2014  . Claudication of lower extremity ([place] 10/25/2013  . BPH with obstruction/lower urinary tract symptoms 10/25/2013  . Hypertriglyceridemia 08/15/2012  . Melanoma ([place]   . Dizziness   . Renal insufficiency   . Coronary artery disease with exertional angina ([place]   . Hyperlipidemia   . Essential hypertension   . OSA (obstructive sleep apnea)   . Ejection fraction   . SOB (shortness of breath)   . PARESTHESIA 05/30/2009  . EDEMA 01/22/2009  . OTHER ABNORMAL BLOOD CHEMISTRY 04/25/2008  . Gout 10/21/2007  . DEPRESSION 10/21/2007  . VENTRAL HERNIA 10/21/2007  . HIATAL HERNIA 10/21/2007  . FATIGUE 10/21/2007  . SKIN CANCER, HX OF 10/21/2007  . NEPHROLITHIASIS, HX OF 10/21/2007    [name] PNilda SimmerPT, MPH  03/22/2018,  11:47 AM  [facility] [address]Phone: [phone]  Fax:  [phone] Name: William BOQUETMRN: [id number] of Birth: [birth year]

## 2018-03-22 NOTE — Telephone Encounter (Signed)
Copied from [place] [id number]. Topic: Quick Communication - Rx Refill/Question >> Mar 22, 2018  1:21 PM Mylinda Latina, NT wrote: Medication: allopurinol (ZYLOPRIM) 300 MG tablet    Has the patient contacted their pharmacy? No., because no refills  (Agent: If no, request that the patient contact the pharmacy for the refill.) (Agent: If yes, when and what did the pharmacy advise?)  Preferred Pharmacy (with phone number or street name): [place], [place] [phone] (Phone) [phone] (Fax)      Agent: Please be advised that RX refills may take up to 3 business days. We ask that you follow-up with your pharmacy.

## 2018-03-24 ENCOUNTER — Encounter: Payer: Self-pay | Admitting: Rehabilitative and Restorative Service Providers"

## 2018-03-24 ENCOUNTER — Ambulatory Visit: Payer: Medicare HMO | Admitting: Rehabilitative and Restorative Service Providers"

## 2018-03-24 DIAGNOSIS — M5441 Lumbago with sciatica, right side: Secondary | ICD-10-CM | POA: Diagnosis not present

## 2018-03-24 DIAGNOSIS — G8929 Other chronic pain: Secondary | ICD-10-CM | POA: Diagnosis not present

## 2018-03-24 DIAGNOSIS — M6281 Muscle weakness (generalized): Secondary | ICD-10-CM | POA: Diagnosis not present

## 2018-03-24 DIAGNOSIS — R29898 Other symptoms and signs involving the musculoskeletal system: Secondary | ICD-10-CM | POA: Diagnosis not present

## 2018-03-24 DIAGNOSIS — M5442 Lumbago with sciatica, left side: Secondary | ICD-10-CM | POA: Diagnosis not present

## 2018-03-24 NOTE — Therapy (Signed)
[place], [place], [address] Phone: [phone]   Fax:  [phone]  Physical Therapy Treatment  Patient Details  Name: William Hendrix MRN: [id number] Date of Birth: Aug 21, 1941 Referring Provider: Dr Penni Homans   Encounter Date: 03/24/2018  PT End of Session - 03/24/18 1401    Visit Number  12    Number of Visits  20    Date for PT Re-Evaluation  05/05/18    PT Start Time  1400    PT Stop Time  3614    PT Time Calculation (min)  57 min    Activity Tolerance  Patient tolerated treatment well       Past Medical History:  Diagnosis Date  . AAA (abdominal aortic aneurysm) ([place]) 12/15/2014   a. Mild aneurysmal dilatation of the infrarenal abdominal aorta 3.2 cm - f/u due by 2019.  . Arthritis    "all over" (07/30/2016)  . CAD (coronary artery disease)    a. stent to LAD 2000. b. possible spasm by cath 2001. c. IVUS/PTCA/DES to mLAD 05/2013. d. PTCA/DES of dRCA into ostial rPDA 07/2013. e. PTCA of OM2, DES to Cx, DES to [facility] 07/2016.  Marland Kitchen Chronic bronchitis ([place])   . Chronic diastolic CHF (congestive heart failure) ([place])   . Chronic lower back pain   . Chronic neck pain   . CKD (chronic kidney disease), stage III ([place])   . Diabetic peripheral neuropathy ([place])   . Ejection fraction    55%, 07/2010, mild inferior hypo  . GERD (gastroesophageal reflux disease)   . Gout   . H/O diverticulitis of colon 01/31/2015  . H/O hiatal hernia   . History of blood transfusion ~ 10/1940   "had pneumonia"  . History of kidney stones   . HTN (hypertension)   . Hyperlipidemia   . Melanoma of forearm, left ([place]) 02/2011   with wide excision   . Myocardial infarction ([place])   . Nephrolithiasis   . Obesity   . OSA on CPAP     [name] since 2000  . Peripheral neuropathy    both feet  . Positive D-dimer    a. significant elevation ,hospital 07/2010, etiology unclear. b. D Dimer chronically > 20.  . Skin  cancer   . Spinal stenosis    a. s/p surgical repair 2013  . Spinal stenosis of lumbar region   . Type II diabetes mellitus ([place])   . Ventral hernia     Past Surgical History:  Procedure Laterality Date  . ANTERIOR LAT LUMBAR FUSION  10/22/2017   Procedure: Lumbar two-three Lumbar three-four Lumbar four-five Anteriolateral lumbar interbody fusion with percutaneous pedicle screw fixation and infuse;  Surgeon: [name], MD;  Location: [facility];  Service: Neurosurgery;;  . APPLICATION OF ROBOTIC ASSISTANCE FOR SPINAL PROCEDURE  10/22/2017   Procedure: APPLICATION OF ROBOTIC ASSISTANCE FOR SPINAL PROCEDURE;  Surgeon: [name], MD;  Location: [facility];  Service: Neurosurgery;;  . BACK SURGERY    . CARDIAC CATHETERIZATION N/A 07/30/2016   Procedure: Left Heart Cath and Coronary Angiography;  Surgeon: Nelva Bush, MD;  Location: [place] [facility] CV LAB;  Service: Cardiovascular;  Laterality: N/A;  . CARDIAC CATHETERIZATION N/A 07/30/2016   Procedure: Coronary Stent Intervention;  Surgeon: Nelva Bush, MD;  Location: [place] [facility] CV LAB;  Service: Cardiovascular;  Laterality: N/A;  Mid CFX and MID RCA  . CARDIAC CATHETERIZATION N/A 07/30/2016   Procedure: Coronary Balloon Angioplasty;  Surgeon: Nelva Bush, MD;  Location: [facility] CV LAB;  Service: Cardiovascular;  Laterality: N/A;  OM 1  . CARPAL TUNNEL RELEASE Left   . CATARACT EXTRACTION W/ INTRAOCULAR LENS  IMPLANT, BILATERAL Bilateral ~ 2014  . CERVICAL DISC SURGERY  1990s  . CORONARY ANGIOPLASTY WITH STENT PLACEMENT  05/2013  . CORONARY ANGIOPLASTY WITH STENT PLACEMENT  07/26/2013   DES to RCA extending to PDA    . CORONARY ANGIOPLASTY WITH STENT PLACEMENT  07/30/2016  . CORONARY ANGIOPLASTY WITH STENT PLACEMENT  2000   CAD  . DENTAL SURGERY  04/2016   "got infected; had to dig it out"  . EYE SURGERY    . KNEE ARTHROSCOPY Right 1990's   rt  . LEFT AND RIGHT HEART CATHETERIZATION WITH CORONARY ANGIOGRAM N/A 07/26/2013    Procedure: LEFT AND RIGHT HEART CATHETERIZATION WITH CORONARY ANGIOGRAM;  Surgeon: [name], MD;  Location: [facility] CATH LAB;  Service: Cardiovascular;  Laterality: N/A;  . LEFT HEART CATH AND CORONARY ANGIOGRAPHY N/A 02/26/2017   Procedure: Left Heart Cath and Coronary Angiography;  Surgeon: Sherren Mocha, MD;  Location: [facility] CV LAB;  Service: Cardiovascular;  Laterality: N/A;  . LEFT HEART CATHETERIZATION WITH CORONARY ANGIOGRAM N/A 05/19/2013   Procedure: LEFT HEART CATHETERIZATION WITH CORONARY ANGIOGRAM;  Surgeon: [name], MD;  Location: [facility] CATH LAB;  Service: Cardiovascular;  Laterality: N/A;  . LUMBAR LAMINECTOMY/DECOMPRESSION MICRODISCECTOMY  08/30/2012   Procedure: LUMBAR LAMINECTOMY/DECOMPRESSION MICRODISCECTOMY 2 LEVELS;  Surgeon: [name], MD;  Location: [place] NEURO ORS;  Service: Neurosurgery;  Laterality: Bilateral;  Bilateral Lumbar three-four Lumbar four-five Laminotomies  . LUMBAR PERCUTANEOUS PEDICLE SCREW 1 LEVEL Bilateral 10/22/2017   Procedure: LUMBAR PERCUTANEOUS PEDICLE SCREW 1 LEVEL;  Surgeon: [name], MD;  Location: [facility];  Service: Neurosurgery;  Laterality: Bilateral;  . LUMBAR SPINE SURGERY  04/07/2016   Dr. Ellene Route; "?ruptured disc"  . MELANOMA EXCISION Left 02/2011   forearm  . NASAL SINUS SURGERY  1970s   "cut windows in sinus pockets"  . PERCUTANEOUS CORONARY STENT INTERVENTION (PCI-S)  05/19/2013   Procedure: PERCUTANEOUS CORONARY STENT INTERVENTION (PCI-S);  Surgeon: [name], MD;  Location: [facility] CATH LAB;  Service: Cardiovascular;;  . ULNAR TUNNEL RELEASE Left 04/07/2016   Dr. Ellene Route    There were no vitals filed for this visit.  Subjective Assessment - 03/24/18 1410    Subjective  Patient reports that he making progress. He is walking without walker in all situations. He has weaned from his lumbar support except when he is driving distances or working in his yard. William Hendrix can tell he is progressing and wishes he came to PT earlier.      Currently in Pain?  No/denies    Pain Score  6     Pain Location  Hip    Pain Orientation  Right;Left    Pain Descriptors / Indicators  Tightness;Sore    Pain Type  Chronic pain    Pain Onset  More than a month ago    Pain Frequency  Constant         [facility] PT Assessment - 03/24/18 0001      Assessment   Medical Diagnosis  Back pain     Referring Provider  Dr Penni Homans    Onset Date/Surgical Date  10/22/17    Hand Dominance  Right    Next MD Visit  to schedule     Prior Therapy  yes in the past for LB - chiropractic care with no improvement - none since  surgery       Observation/Other Assessments   Focus on Therapeutic Outcomes (FOTO)   62% limitation      Strength   Overall Strength Comments  assessed in sitting     Right Hip Flexion  4+/5    Right Hip Extension  4+/5    Right Hip ABduction  4+/5    Left Hip Flexion  4+/5    Left Hip Extension  4+/5    Left Hip ABduction  4+/5    Right Knee Flexion  -- 5-/5    Right Knee Extension  -- 5-/5    Left Knee Flexion  -- 5-/5    Left Knee Extension  -- 5-/5      Palpation   Palpation comment  muscular tightness though lumbar spine into posterior hips Rt > Lt; quads; hamstrings       Transfers   Transfer Cueing  -- continued improvemenr in transitional movements     Comments  patient able to move into prone position; remained prone for 15-20 min; transitioned to sitting independently - moving in and out of supine; sidelying; standing with decreased difficulty       Ambulation/Gait   Gait Comments  ambulates w/out assistive device - less flexed forward at trunk/hips; good stride length; no significant limp; improving gait pattern                   [facility] Adult PT Treatment/Exercise - 03/24/18 0001      Lumbar Exercises: Stretches   Sports administrator  Right;Left;3 reps;60 seconds prone with PT assist     Sports administrator Limitations  sidelying with LE supported on bolster PT assisting with quad/hip flexor stretch 30-45  sec x 4 each LE - prolonged stretch in sidelying into hip extension and knee flexion     Gastroc Stretch  Left;Right;2 reps;30 seconds    Other Lumbar Stretch Exercise  hip flexor stretch standing one foot back - pushing hips forward 30 sec x 3 each LE       Lumbar Exercises: Aerobic   Nustep  L5 x 6 min       Lumbar Exercises: Standing   Other Standing Lumbar Exercises  walking laps between exercises/stretching VC for posture and alignment       Lumbar Exercises: Seated   Sit to Stand  -- core engaged x 2-3       Moist Heat Therapy   Number Minutes Moist Heat  20 Minutes    Moist Heat Location  Lumbar Spine bilat quads       Electrical Stimulation   Electrical Stimulation Location  Rt/Lt anterior lateral quad    Electrical Stimulation Action  pre mod    Electrical Stimulation Parameters  to tolerance    Electrical Stimulation Goals  Pain;Tone      Manual Therapy   Manual therapy comments  pt supine     Soft tissue mobilization  deep tissue work through Rt/Lt anterior lateral quad                   PT Long Term Goals - 03/24/18 1409      PT LONG TERM GOAL #1   Title  Improve LE mobility and ROM to WFL's thus decreasing muscular tightness and cramping/pain 05/05/18    Time  12    Period  Weeks    Status  Revised      PT LONG TERM GOAL #2   Title  Increase strength bilat LE's  to 4+/5 to 5-/5 throughout 05/05/18    Time  12    Period  Weeks    Status  Revised      PT LONG TERM GOAL #3   Title  Progress with ADL's and functional activities with plan to return to boating and fishing 05/05/18    Time  12    Period  Weeks    Status  Revised      PT LONG TERM GOAL #4   Title  Independent in HEP 05/05/18    Time  12    Period  Weeks    Status  Revised      PT LONG TERM GOAL #5   Title  Improve FOTO to </= 56% limitation 05/05/18    Time  12    Period  Weeks    Status  Revised            Plan - 03/24/18 1405    Clinical Impression Statement  Patient  reports that he is making progress. He is walking better and no longer using the walker at all. He uses his lumbar support only when he is driving distances or cutting the grass, etc... Patient demonstrates increased mobility and ROM through lumbar spine and LE's and increased strength in LE's. William Hendrix continues to have significant muscular tightness to palpation through the posterior hips and LE's. He will continue to benefit from PT to address problems and reach more functional activity level.     Rehab Potential  Good    PT Frequency  2x / week    PT Duration  12 weeks    PT Treatment/Interventions  Patient/family education;ADLs/Self Care Home Management;Cryotherapy;Electrical Stimulation;Iontophoresis 4mg /ml Dexamethasone;Moist Heat;Ultrasound;Dry needling;Manual techniques;Neuromuscular re-education;Gait training;Functional mobility training;Therapeutic activities;Therapeutic exercise;Balance training    PT Next Visit Plan  continue progressing core stabilization and stretching for LE. Add bridging to HEP if tolerated.  continue DN as indicated     Consulted and Agree with Plan of Care  Patient       Patient will benefit from skilled therapeutic intervention in order to improve the following deficits and impairments:  Postural dysfunction, Improper body mechanics, Pain, Increased fascial restricitons, Increased muscle spasms, Decreased mobility, Decreased range of motion, Decreased strength, Decreased activity tolerance  Visit Diagnosis: Chronic bilateral low back pain with bilateral sciatica - Plan: PT plan of care cert/re-cert  Muscle weakness (generalized) - Plan: PT plan of care cert/re-cert  Other symptoms and signs involving the musculoskeletal system - Plan: PT plan of care cert/re-cert     Problem List Patient Active Problem List   Diagnosis Date Noted  . Anemia 11/07/2017  . Constipation 11/07/2017  . Lumbar stenosis with neurogenic claudication 10/22/2017  . Back pain  08/29/2017  . Right-sided low back pain with right-sided sciatica 11/22/2016  . Pressure injury of skin 08/03/2016  . Dyspnea 08/01/2016  . NSTEMI (non-ST elevated myocardial infarction) ([place]) 07/30/2016  . CAD in native artery   . Obesity   . Uncontrolled type 2 diabetes mellitus with circulatory disorder, with long-term current use of insulin ([place]) 11/28/2015  . Lumbar radiculopathy 10/09/2015  . Neuropathy, diabetic ([place]) 05/22/2015  . H/O diverticulitis of colon 01/31/2015  . AAA (abdominal aortic aneurysm) ([place]) 01/01/2015  . Sinusitis 10/10/2014  . Hip pain, bilateral 07/05/2014  . Palpitations 02/27/2014  . Claudication of lower extremity ([place]) 10/25/2013  . BPH with obstruction/lower urinary tract symptoms 10/25/2013  . Hypertriglyceridemia 08/15/2012  . Melanoma ([place])   . Dizziness   . Renal  insufficiency   . Coronary artery disease with exertional angina ([place])   . Hyperlipidemia   . Essential hypertension   . OSA (obstructive sleep apnea)   . Ejection fraction   . SOB (shortness of breath)   . PARESTHESIA 05/30/2009  . EDEMA 01/22/2009  . OTHER ABNORMAL BLOOD CHEMISTRY 04/25/2008  . Gout 10/21/2007  . DEPRESSION 10/21/2007  . VENTRAL HERNIA 10/21/2007  . HIATAL HERNIA 10/21/2007  . FATIGUE 10/21/2007  . SKIN CANCER, HX OF 10/21/2007  . NEPHROLITHIASIS, HX OF 10/21/2007    Pattye Meda Nilda Simmer PT, MPH  03/24/2018, 2:49 PM  [facility], [place], [address] Phone: [phone]   Fax:  [phone]  Name: William Hendrix MRN: [id number] Date of Birth: [birth year]

## 2018-03-29 ENCOUNTER — Ambulatory Visit: Payer: Medicare HMO | Admitting: Rehabilitative and Restorative Service Providers"

## 2018-03-29 DIAGNOSIS — R29898 Other symptoms and signs involving the musculoskeletal system: Secondary | ICD-10-CM | POA: Diagnosis not present

## 2018-03-29 DIAGNOSIS — M5442 Lumbago with sciatica, left side: Secondary | ICD-10-CM | POA: Diagnosis not present

## 2018-03-29 DIAGNOSIS — M6281 Muscle weakness (generalized): Secondary | ICD-10-CM

## 2018-03-29 DIAGNOSIS — M5441 Lumbago with sciatica, right side: Secondary | ICD-10-CM

## 2018-03-29 DIAGNOSIS — G8929 Other chronic pain: Secondary | ICD-10-CM | POA: Diagnosis not present

## 2018-03-29 NOTE — Therapy (Signed)
[place], [place], [address] Phone: [phone]   Fax:  [phone]  Physical Therapy Treatment  Patient Details  Name: William Hendrix MRN: [id number] Date of Birth: Mar 30, 1941 Referring Provider: Dr Penni Homans   Encounter Date: 03/29/2018  PT End of Session - 03/29/18 1201    Visit Number  13    Number of Visits  20    Date for PT Re-Evaluation  05/05/18    PT Start Time  1324    PT Stop Time  1250    PT Time Calculation (min)  52 min    Activity Tolerance  Patient tolerated treatment well       Past Medical History:  Diagnosis Date  . AAA (abdominal aortic aneurysm) ([place]) 12/15/2014   a. Mild aneurysmal dilatation of the infrarenal abdominal aorta 3.2 cm - f/u due by 2019.  . Arthritis    "all over" (07/30/2016)  . CAD (coronary artery disease)    a. stent to LAD 2000. b. possible spasm by cath 2001. c. IVUS/PTCA/DES to mLAD 05/2013. d. PTCA/DES of dRCA into ostial rPDA 07/2013. e. PTCA of OM2, DES to Cx, DES to [facility] 07/2016.  Marland Kitchen Chronic bronchitis ([place])   . Chronic diastolic CHF (congestive heart failure) ([place])   . Chronic lower back pain   . Chronic neck pain   . CKD (chronic kidney disease), stage III ([place])   . Diabetic peripheral neuropathy ([place])   . Ejection fraction    55%, 07/2010, mild inferior hypo  . GERD (gastroesophageal reflux disease)   . Gout   . H/O diverticulitis of colon 01/31/2015  . H/O hiatal hernia   . History of blood transfusion ~ 10/1940   "had pneumonia"  . History of kidney stones   . HTN (hypertension)   . Hyperlipidemia   . Melanoma of forearm, left ([place]) 02/2011   with wide excision   . Myocardial infarction ([place])   . Nephrolithiasis   . Obesity   . OSA on CPAP     [name] since 2000  . Peripheral neuropathy    both feet  . Positive D-dimer    a. significant elevation ,hospital 07/2010, etiology unclear. b. D Dimer chronically > 20.  . Skin  cancer   . Spinal stenosis    a. s/p surgical repair 2013  . Spinal stenosis of lumbar region   . Type II diabetes mellitus ([place])   . Ventral hernia     Past Surgical History:  Procedure Laterality Date  . ANTERIOR LAT LUMBAR FUSION  10/22/2017   Procedure: Lumbar two-three Lumbar three-four Lumbar four-five Anteriolateral lumbar interbody fusion with percutaneous pedicle screw fixation and infuse;  Surgeon: [name], MD;  Location: [place];  Service: Neurosurgery;;  . APPLICATION OF ROBOTIC ASSISTANCE FOR SPINAL PROCEDURE  10/22/2017   Procedure: APPLICATION OF ROBOTIC ASSISTANCE FOR SPINAL PROCEDURE;  Surgeon: [name], MD;  Location: [place] [place];  Service: Neurosurgery;;  . BACK SURGERY    . CARDIAC CATHETERIZATION N/A 07/30/2016   Procedure: Left Heart Cath and Coronary Angiography;  Surgeon: Nelva Bush, MD;  Location: [place] CV [facility];  Service: Cardiovascular;  Laterality: N/A;  . CARDIAC CATHETERIZATION N/A 07/30/2016   Procedure: Coronary Stent Intervention;  Surgeon: Nelva Bush, MD;  Location: [place] [facility];  Service: Cardiovascular;  Laterality: N/A;  Mid CFX and MID RCA  . CARDIAC CATHETERIZATION N/A 07/30/2016   Procedure: Coronary Balloon Angioplasty;  Surgeon: Nelva Bush, MD;  Location: [place] [facility];  Service: Cardiovascular;  Laterality: N/A;  OM 1  . CARPAL TUNNEL RELEASE Left   . CATARACT EXTRACTION W/ INTRAOCULAR LENS  IMPLANT, BILATERAL Bilateral ~ 2014  . CERVICAL DISC SURGERY  1990s  . CORONARY ANGIOPLASTY WITH STENT PLACEMENT  05/2013  . CORONARY ANGIOPLASTY WITH STENT PLACEMENT  07/26/2013   DES to RCA extending to PDA    . CORONARY ANGIOPLASTY WITH STENT PLACEMENT  07/30/2016  . CORONARY ANGIOPLASTY WITH STENT PLACEMENT  2000   CAD  . DENTAL SURGERY  04/2016   "got infected; had to dig it out"  . EYE SURGERY    . KNEE ARTHROSCOPY Right 1990's   rt  . LEFT AND RIGHT HEART CATHETERIZATION WITH CORONARY ANGIOGRAM N/A 07/26/2013    Procedure: LEFT AND RIGHT HEART CATHETERIZATION WITH CORONARY ANGIOGRAM;  Surgeon: [name], MD;  Location: [facility] CATH LAB;  Service: Cardiovascular;  Laterality: N/A;  . LEFT HEART CATH AND CORONARY ANGIOGRAPHY N/A 02/26/2017   Procedure: Left Heart Cath and Coronary Angiography;  Surgeon: Sherren Mocha, MD;  Location: [place] [facility];  Service: Cardiovascular;  Laterality: N/A;  . LEFT HEART CATHETERIZATION WITH CORONARY ANGIOGRAM N/A 05/19/2013   Procedure: LEFT HEART CATHETERIZATION WITH CORONARY ANGIOGRAM;  Surgeon: [name], MD;  Location: [facility] CATH LAB;  Service: Cardiovascular;  Laterality: N/A;  . LUMBAR LAMINECTOMY/DECOMPRESSION MICRODISCECTOMY  08/30/2012   Procedure: LUMBAR LAMINECTOMY/DECOMPRESSION MICRODISCECTOMY 2 LEVELS;  Surgeon: [name], MD;  Location: [place] [facility];  Service: Neurosurgery;  Laterality: Bilateral;  Bilateral Lumbar three-four Lumbar four-five Laminotomies  . LUMBAR PERCUTANEOUS PEDICLE SCREW 1 LEVEL Bilateral 10/22/2017   Procedure: LUMBAR PERCUTANEOUS PEDICLE SCREW 1 LEVEL;  Surgeon: [name], MD;  Location: [facility];  Service: Neurosurgery;  Laterality: Bilateral;  . LUMBAR SPINE SURGERY  04/07/2016   Dr. Ellene Route; "?ruptured disc"  . MELANOMA EXCISION Left 02/2011   forearm  . NASAL SINUS SURGERY  1970s   "cut windows in sinus pockets"  . PERCUTANEOUS CORONARY STENT INTERVENTION (PCI-S)  05/19/2013   Procedure: PERCUTANEOUS CORONARY STENT INTERVENTION (PCI-S);  Surgeon: [name], MD;  Location: [facility] CATH LAB;  Service: Cardiovascular;;  . ULNAR TUNNEL RELEASE Left 04/07/2016   Dr. Ellene Route    There were no vitals filed for this visit.  Subjective Assessment - 03/29/18 1202    Subjective  Patient reports that he was in the ED with his daughter all night last night - has only slept a couple of hours this morning. But he feels the tightness in his legs is better. He has less knots in his thigh and the pain is decreased.     Currently in  Pain?  Yes    Pain Score  5     Pain Location  Hip    Pain Orientation  Right;Left    Pain Descriptors / Indicators  Tightness;Sore         [facility] PT Assessment - 03/29/18 0001      Assessment   Medical Diagnosis  Back pain     Referring Provider  Dr Penni Homans    Onset Date/Surgical Date  10/22/17    Hand Dominance  Right    Next MD Visit  to schedule     Prior Therapy  yes in the past for LB - chiropractic care with no improvement - none since surgery       Strength   Overall Strength Comments  patient now tolerates prone lying for testing hip extension  Right Hip Extension  -- hip extension in prone 4-/5    Left Hip Extension  -- hip extension in prone 4-/5      Palpation   Palpation comment  muscular tightness though lumbar spine into posterior hips Rt > Lt; quads; hamstrings                    [facility] Adult PT Treatment/Exercise - 03/29/18 0001      Lumbar Exercises: Stretches   Sports administrator  Right;Left;3 reps;60 seconds prone with PT assist     Quad Stretch Limitations  sidelying with LE supported on bolster PT assisting with quad/hip flexor stretch 30-45 sec x 4 each LE - prolonged stretch in sidelying into hip extension and knee flexion     Gastroc Stretch  Left;Right;2 reps;30 seconds      Lumbar Exercises: Aerobic   Nustep  L5 x 5 min       Moist Heat Therapy   Number Minutes Moist Heat  15 Minutes    Moist Heat Location  Lumbar Spine bilat quads       Electrical Stimulation   Electrical Stimulation Location  Rt/Lt anterior lateral quad    Electrical Stimulation Action  pre mod    Electrical Stimulation Parameters  to tolerance    Electrical Stimulation Goals  Pain;Tone      Manual Therapy   Manual therapy comments  pt supine     Soft tissue mobilization  deep tissue work through Rt/Lt anterior lateral quad        Trigger Point Dry Needling - 03/29/18 1219    Consent Given?  Yes    Tensor Fascia Lata Response  Palpable increased muscle  length    Quadriceps Response  Palpable increased muscle length                PT Long Term Goals - 03/29/18 1218      PT LONG TERM GOAL #1   Title  Improve LE mobility and ROM to WFL's thus decreasing muscular tightness and cramping/pain 05/05/18    Time  12    Period  Weeks    Status  On-going      PT LONG TERM GOAL #2   Title  Increase strength bilat LE's to 4+/5 to 5-/5 throughout 05/05/18    Time  12    Period  Weeks    Status  On-going      PT LONG TERM GOAL #3   Title  Progress with ADL's and functional activities with plan to return to boating and fishing 05/05/18    Time  12    Period  Weeks    Status  On-going      PT LONG TERM GOAL #4   Title  Independent in HEP 05/05/18    Time  12    Period  Weeks    Status  On-going      PT LONG TERM GOAL #5   Title  Improve FOTO to </= 56% limitation 05/05/18    Time  12    Period  Weeks    Status  On-going            Plan - 03/29/18 1204    Clinical Impression Statement  Good response to treatment with patient reporting decreased pain and tightness in his thighs. Pleased with progress. Paatient has decreased tightness to palpation through the quads and ITB/TFL. Patient demonstrates increased functional mobiliity and activity level.      Rehab  Potential  Good    PT Frequency  2x / week    PT Duration  12 weeks    PT Treatment/Interventions  Patient/family education;ADLs/Self Care Home Management;Cryotherapy;Electrical Stimulation;Iontophoresis 4mg /ml Dexamethasone;Moist Heat;Ultrasound;Dry needling;Manual techniques;Neuromuscular re-education;Gait training;Functional mobility training;Therapeutic activities;Therapeutic exercise;Balance training    PT Next Visit Plan  continue progressing core stabilization and stretching for LE. Add bridging to HEP if tolerated.  continue DN as indicated     Consulted and Agree with Plan of Care  Patient       Patient will benefit from skilled therapeutic intervention in order  to improve the following deficits and impairments:  Postural dysfunction, Improper body mechanics, Pain, Increased fascial restricitons, Increased muscle spasms, Decreased mobility, Decreased range of motion, Decreased strength, Decreased activity tolerance  Visit Diagnosis: Chronic bilateral low back pain with bilateral sciatica  Muscle weakness (generalized)  Other symptoms and signs involving the musculoskeletal system     Problem List Patient Active Problem List   Diagnosis Date Noted  . Anemia 11/07/2017  . Constipation 11/07/2017  . Lumbar stenosis with neurogenic claudication 10/22/2017  . Back pain 08/29/2017  . Right-sided low back pain with right-sided sciatica 11/22/2016  . Pressure injury of skin 08/03/2016  . Dyspnea 08/01/2016  . NSTEMI (non-ST elevated myocardial infarction) ([place]) 07/30/2016  . CAD in native artery   . Obesity   . Uncontrolled type 2 diabetes mellitus with circulatory disorder, with long-term current use of insulin ([place]) 11/28/2015  . Lumbar radiculopathy 10/09/2015  . Neuropathy, diabetic ([place]) 05/22/2015  . H/O diverticulitis of colon 01/31/2015  . AAA (abdominal aortic aneurysm) ([place]) 01/01/2015  . Sinusitis 10/10/2014  . Hip pain, bilateral 07/05/2014  . Palpitations 02/27/2014  . Claudication of lower extremity ([place]) 10/25/2013  . BPH with obstruction/lower urinary tract symptoms 10/25/2013  . Hypertriglyceridemia 08/15/2012  . Melanoma ([place])   . Dizziness   . Renal insufficiency   . Coronary artery disease with exertional angina ([place])   . Hyperlipidemia   . Essential hypertension   . OSA (obstructive sleep apnea)   . Ejection fraction   . SOB (shortness of breath)   . PARESTHESIA 05/30/2009  . EDEMA 01/22/2009  . OTHER ABNORMAL BLOOD CHEMISTRY 04/25/2008  . Gout 10/21/2007  . DEPRESSION 10/21/2007  . VENTRAL HERNIA 10/21/2007  . HIATAL HERNIA 10/21/2007  . FATIGUE 10/21/2007  . SKIN CANCER, HX OF 10/21/2007  .  NEPHROLITHIASIS, HX OF 10/21/2007    Celyn Nilda Simmer PT, MPH  03/29/2018, 12:40 PM  [facility] [place], [place], [address] Phone: [phone]   Fax:  [phone]  Name: William Hendrix MRN: [id number] Date of Birth: 02-21-1941

## 2018-03-31 ENCOUNTER — Ambulatory Visit: Payer: Medicare HMO | Admitting: Rehabilitative and Restorative Service Providers"

## 2018-03-31 DIAGNOSIS — M5441 Lumbago with sciatica, right side: Secondary | ICD-10-CM

## 2018-03-31 DIAGNOSIS — G8929 Other chronic pain: Secondary | ICD-10-CM

## 2018-03-31 DIAGNOSIS — M5442 Lumbago with sciatica, left side: Secondary | ICD-10-CM | POA: Diagnosis not present

## 2018-03-31 DIAGNOSIS — M6281 Muscle weakness (generalized): Secondary | ICD-10-CM

## 2018-03-31 DIAGNOSIS — R29898 Other symptoms and signs involving the musculoskeletal system: Secondary | ICD-10-CM

## 2018-03-31 NOTE — Therapy (Addendum)
[place], [place], [address] Phone: [phone]   Fax:  [phone]  Physical Therapy Treatment  Patient Details  Name: William Hendrix MRN: [id number] Date of Birth: 1941-06-03 Referring Provider: Dr Penni Homans   Encounter Date: 03/31/2018  PT End of Session - 03/31/18 1400    Visit Number  14    Number of Visits  20    Date for PT Re-Evaluation  05/05/18    PT Start Time  1400    PT Stop Time  1458    PT Time Calculation (min)  58 min    Activity Tolerance  Patient tolerated treatment well       Past Medical History:  Diagnosis Date  . AAA (abdominal aortic aneurysm) ([place]) 12/15/2014   a. Mild aneurysmal dilatation of the infrarenal abdominal aorta 3.2 cm - f/u due by 2019.  . Arthritis    "all over" (07/30/2016)  . CAD (coronary artery disease)    a. stent to LAD 2000. b. possible spasm by cath 2001. c. IVUS/PTCA/DES to mLAD 05/2013. d. PTCA/DES of dRCA into ostial rPDA 07/2013. e. PTCA of OM2, DES to Cx, DES to [facility] 07/2016.  Marland Kitchen Chronic bronchitis ([place])   . Chronic diastolic CHF (congestive heart failure) ([place])   . Chronic lower back pain   . Chronic neck pain   . CKD (chronic kidney disease), stage III ([place])   . Diabetic peripheral neuropathy ([place])   . Ejection fraction    55%, 07/2010, mild inferior hypo  . GERD (gastroesophageal reflux disease)   . Gout   . H/O diverticulitis of colon 01/31/2015  . H/O hiatal hernia   . History of blood transfusion ~ 10/1940   "had pneumonia"  . History of kidney stones   . HTN (hypertension)   . Hyperlipidemia   . Melanoma of forearm, left ([place]) 02/2011   with wide excision   . Myocardial infarction ([place])   . Nephrolithiasis   . Obesity   . OSA on CPAP     [name] since 2000  . Peripheral neuropathy    both feet  . Positive D-dimer    a. significant elevation ,hospital 07/2010, etiology unclear. b. D Dimer chronically > 20.  . Skin  cancer   . Spinal stenosis    a. s/p surgical repair 2013  . Spinal stenosis of lumbar region   . Type II diabetes mellitus ([place])   . Ventral hernia     Past Surgical History:  Procedure Laterality Date  . ANTERIOR LAT LUMBAR FUSION  10/22/2017   Procedure: Lumbar two-three Lumbar three-four Lumbar four-five Anteriolateral lumbar interbody fusion with percutaneous pedicle screw fixation and infuse;  Surgeon: [name], MD;  Location: [facility];  Service: Neurosurgery;;  . APPLICATION OF ROBOTIC ASSISTANCE FOR SPINAL PROCEDURE  10/22/2017   Procedure: APPLICATION OF ROBOTIC ASSISTANCE FOR SPINAL PROCEDURE;  Surgeon: [name], MD;  Location: [facility];  Service: Neurosurgery;;  . BACK SURGERY    . CARDIAC CATHETERIZATION N/A 07/30/2016   Procedure: Left Heart Cath and Coronary Angiography;  Surgeon: Nelva Bush, MD;  Location: [facility] CV LAB;  Service: Cardiovascular;  Laterality: N/A;  . CARDIAC CATHETERIZATION N/A 07/30/2016   Procedure: Coronary Stent Intervention;  Surgeon: Nelva Bush, MD;  Location: [facility] CV LAB;  Service: Cardiovascular;  Laterality: N/A;  Mid CFX and MID RCA  . CARDIAC CATHETERIZATION N/A 07/30/2016   Procedure: Coronary Balloon Angioplasty;  Surgeon: Nelva Bush, MD;  Location: [facility];  Service: Cardiovascular;  Laterality: N/A;  OM 1  . CARPAL TUNNEL RELEASE Left   . CATARACT EXTRACTION W/ INTRAOCULAR LENS  IMPLANT, BILATERAL Bilateral ~ 2014  . CERVICAL DISC SURGERY  1990s  . CORONARY ANGIOPLASTY WITH STENT PLACEMENT  05/2013  . CORONARY ANGIOPLASTY WITH STENT PLACEMENT  07/26/2013   DES to RCA extending to PDA    . CORONARY ANGIOPLASTY WITH STENT PLACEMENT  07/30/2016  . CORONARY ANGIOPLASTY WITH STENT PLACEMENT  2000   CAD  . DENTAL SURGERY  04/2016   "got infected; had to dig it out"  . EYE SURGERY    . KNEE ARTHROSCOPY Right 1990's   rt  . LEFT AND RIGHT HEART CATHETERIZATION WITH CORONARY ANGIOGRAM N/A 07/26/2013    Procedure: LEFT AND RIGHT HEART CATHETERIZATION WITH CORONARY ANGIOGRAM;  Surgeon: [name], MD;  Location: [facility] CATH LAB;  Service: Cardiovascular;  Laterality: N/A;  . LEFT HEART CATH AND CORONARY ANGIOGRAPHY N/A 02/26/2017   Procedure: Left Heart Cath and Coronary Angiography;  Surgeon: Sherren Mocha, MD;  Location: [facility] LAB;  Service: Cardiovascular;  Laterality: N/A;  . LEFT HEART CATHETERIZATION WITH CORONARY ANGIOGRAM N/A 05/19/2013   Procedure: LEFT HEART CATHETERIZATION WITH CORONARY ANGIOGRAM;  Surgeon: [name], MD;  Location: [facility] CATH LAB;  Service: Cardiovascular;  Laterality: N/A;  . LUMBAR LAMINECTOMY/DECOMPRESSION MICRODISCECTOMY  08/30/2012   Procedure: LUMBAR LAMINECTOMY/DECOMPRESSION MICRODISCECTOMY 2 LEVELS;  Surgeon: [name], MD;  Location: [facility]URO ORS;  Service: Neurosurgery;  Laterality: Bilateral;  Bilateral Lumbar three-four Lumbar four-five Laminotomies  . LUMBAR PERCUTANEOUS PEDICLE SCREW 1 LEVEL Bilateral 10/22/2017   Procedure: LUMBAR PERCUTANEOUS PEDICLE SCREW 1 LEVEL;  Surgeon: [name], MD;  Location: [facility];  Service: Neurosurgery;  Laterality: Bilateral;  . LUMBAR SPINE SURGERY  04/07/2016   Dr. Ellene Route; "?ruptured disc"  . MELANOMA EXCISION Left 02/2011   forearm  . NASAL SINUS SURGERY  1970s   "cut windows in sinus pockets"  . PERCUTANEOUS CORONARY STENT INTERVENTION (PCI-S)  05/19/2013   Procedure: PERCUTANEOUS CORONARY STENT INTERVENTION (PCI-S);  Surgeon: [name], MD;  Location: [facility] CATH LAB;  Service: Cardiovascular;;  . ULNAR TUNNEL RELEASE Left 04/07/2016   Dr. Ellene Route    There were no vitals filed for this visit.  Subjective Assessment - 03/31/18 1401    Subjective  Tried today. Daughter is still not doing well. She is now in the hospital and they are trying to regulate her blood sugar. William Hendrix that his legs are still sore and tight - but not as bad as they were. Doing his exercises at home.     Currently in  Pain?  Yes    Pain Score  5     Pain Location  Hip    Pain Orientation  Right;Left    Pain Type  Chronic pain                       [facility] Adult PT Treatment/Exercise - 03/31/18 0001      Lumbar Exercises: Stretches   Quad Stretch  Right;Left;3 reps;60 seconds prone with PT assist Rt 107 deg knee flex; Lt 107 deg     Quad Stretch Limitations  --      Lumbar Exercises: Aerobic   Nustep  L5 x 5 min       Moist Heat Therapy   Number Minutes Moist Heat  15 Minutes    Moist Heat Location  Lumbar Spine  bilat quads       Acupuncturist Location  Rt/Lt anterior lateral quad    Electrical Stimulation Action  pre mod    Electrical Stimulation Parameters  to tolerance    Electrical Stimulation Goals  Pain;Tone      Manual Therapy   Manual therapy comments  pt supine     Soft tissue mobilization  deep tissue work through Rt/Lt anterior lateral quad        Trigger Point Dry Needling - 03/31/18 1448    Consent Given?  Yes    Muscles Treated Lower Body  -- bilat with estim     Tensor Fascia Lata Response  Palpable increased muscle length    Quadriceps Response  Palpable increased muscle length        Patient received treatment of trial of [place] to bilat anterior/lateral thighs  [place] - 1.5 w/cm2; 100%; 1 MHz; 8 min to each LE - total time 16 min         PT Long Term Goals - 03/29/18 1218      PT LONG TERM GOAL #1   Title  Improve LE mobility and ROM to WFL's thus decreasing muscular tightness and cramping/pain 05/05/18    Time  12    Period  Weeks    Status  On-going      PT LONG TERM GOAL #2   Title  Increase strength bilat LE's to 4+/5 to 5-/5 throughout 05/05/18    Time  12    Period  Weeks    Status  On-going      PT LONG TERM GOAL #3   Title  Progress with ADL's and functional activities with plan to return to boating and fishing 05/05/18    Time  12    Period  Weeks    Status  On-going      PT LONG TERM GOAL #4    Title  Independent in HEP 05/05/18    Time  12    Period  Weeks    Status  On-going      PT LONG TERM GOAL #5   Title  Improve FOTO to </= 56% limitation 05/05/18    Time  12    Period  Weeks    Status  On-going            Plan - 03/31/18 1412    Clinical Impression Statement  Patient continues to gradually improve with increased functional activity level and decreased pain. He has persistent tightness in bilat quads - responding well to DN, manual work and stretching.     Rehab Potential  Good    PT Frequency  2x / week    PT Duration  12 weeks    PT Treatment/Interventions  Patient/family education;ADLs/Self Care Home Management;Cryotherapy;Electrical Stimulation;Iontophoresis 4mg /ml Dexamethasone;Moist Heat;Ultrasound;Dry needling;Manual techniques;Neuromuscular re-education;Gait training;Functional mobility training;Therapeutic activities;Therapeutic exercise;Balance training    PT Next Visit Plan  continue progressing core stabilization and stretching for LE. Add bridging to HEP if tolerated.  continue DN as indicated     Consulted and Agree with Plan of Care  Patient       Patient will benefit from skilled therapeutic intervention in order to improve the following deficits and impairments:  Postural dysfunction, Improper body mechanics, Pain, Increased fascial restricitons, Increased muscle spasms, Decreased mobility, Decreased range of motion, Decreased strength, Decreased activity tolerance  Visit Diagnosis: Chronic bilateral low back pain with bilateral sciatica  Muscle weakness (generalized)  Other symptoms and  signs involving the musculoskeletal system     Problem List Patient Active Problem List   Diagnosis Date Noted  . Anemia 11/07/2017  . Constipation 11/07/2017  . Lumbar stenosis with neurogenic claudication 10/22/2017  . Back pain 08/29/2017  . Right-sided low back pain with right-sided sciatica 11/22/2016  . Pressure injury of skin 08/03/2016  .  Dyspnea 08/01/2016  . NSTEMI (non-ST elevated myocardial infarction) ([place]) 07/30/2016  . CAD in native artery   . Obesity   . Uncontrolled type 2 diabetes mellitus with circulatory disorder, with long-term current use of insulin ([place]) 11/28/2015  . Lumbar radiculopathy 10/09/2015  . Neuropathy, diabetic ([place]) 05/22/2015  . H/O diverticulitis of colon 01/31/2015  . AAA (abdominal aortic aneurysm) ([place]) 01/01/2015  . Sinusitis 10/10/2014  . Hip pain, bilateral 07/05/2014  . Palpitations 02/27/2014  . Claudication of lower extremity ([place]) 10/25/2013  . BPH with obstruction/lower urinary tract symptoms 10/25/2013  . Hypertriglyceridemia 08/15/2012  . Melanoma ([place])   . Dizziness   . Renal insufficiency   . Coronary artery disease with exertional angina ([place])   . Hyperlipidemia   . Essential hypertension   . OSA (obstructive sleep apnea)   . Ejection fraction   . SOB (shortness of breath)   . PARESTHESIA 05/30/2009  . EDEMA 01/22/2009  . OTHER ABNORMAL BLOOD CHEMISTRY 04/25/2008  . Gout 10/21/2007  . DEPRESSION 10/21/2007  . VENTRAL HERNIA 10/21/2007  . HIATAL HERNIA 10/21/2007  . FATIGUE 10/21/2007  . SKIN CANCER, HX OF 10/21/2007  . NEPHROLITHIASIS, HX OF 10/21/2007    William Hendrix Nilda Simmer PT, MPH  03/31/2018, 2:51 PM  [facility] [place], [place], [address] Phone: [phone]   Fax:  [phone]  Name: William Hendrix MRN: [id number] Date of Birth: 01-03-1941

## 2018-04-05 ENCOUNTER — Encounter: Payer: Self-pay | Admitting: Rehabilitative and Restorative Service Providers"

## 2018-04-05 ENCOUNTER — Ambulatory Visit: Payer: Medicare HMO | Admitting: Rehabilitative and Restorative Service Providers"

## 2018-04-05 DIAGNOSIS — M5441 Lumbago with sciatica, right side: Secondary | ICD-10-CM

## 2018-04-05 DIAGNOSIS — M6281 Muscle weakness (generalized): Secondary | ICD-10-CM | POA: Diagnosis not present

## 2018-04-05 DIAGNOSIS — M5442 Lumbago with sciatica, left side: Secondary | ICD-10-CM | POA: Diagnosis not present

## 2018-04-05 DIAGNOSIS — R29898 Other symptoms and signs involving the musculoskeletal system: Secondary | ICD-10-CM | POA: Diagnosis not present

## 2018-04-05 DIAGNOSIS — G8929 Other chronic pain: Secondary | ICD-10-CM

## 2018-04-05 NOTE — Therapy (Signed)
[place] [place] [place], [place], [address] Phone: [phone]   Fax:  [phone]  Physical Therapy Treatment  Patient Details  Name: William Hendrix MRN: [id number] Date of Birth: [birth year] Referring Provider: Dr Penni Homans   Encounter Date: 04/05/2018  PT End of Session - 04/05/18 1116    Visit Number  15    Number of Visits  20    Date for PT Re-Evaluation  05/05/18    PT Start Time  1106    PT Stop Time  1214    PT Time Calculation (min)  68 min    Activity Tolerance  Patient tolerated treatment well       Past Medical History:  Diagnosis Date  . AAA (abdominal aortic aneurysm) ([place]) 12/15/2014   a. Mild aneurysmal dilatation of the infrarenal abdominal aorta 3.2 cm - f/u due by 2019.  . Arthritis    "all over" (07/30/2016)  . CAD (coronary artery disease)    a. stent to LAD 2000. b. possible spasm by cath 2001. c. IVUS/PTCA/DES to mLAD 05/2013. d. PTCA/DES of dRCA into ostial rPDA 07/2013. e. PTCA of OM2, DES to Cx, DES to [facility] 07/2016.  Marland Kitchen Chronic bronchitis ([place])   . Chronic diastolic CHF (congestive heart failure) ([place])   . Chronic lower back pain   . Chronic neck pain   . CKD (chronic kidney disease), stage III ([place])   . Diabetic peripheral neuropathy ([place])   . Ejection fraction    55%, 07/2010, mild inferior hypo  . GERD (gastroesophageal reflux disease)   . Gout   . H/O diverticulitis of colon 01/31/2015  . H/O hiatal hernia   . History of blood transfusion ~ 10/1940   "had pneumonia"  . History of kidney stones   . HTN (hypertension)   . Hyperlipidemia   . Melanoma of forearm, left ([place]) 02/2011   with wide excision   . Myocardial infarction ([place])   . Nephrolithiasis   . Obesity   . OSA on CPAP     [name] since 2000  . Peripheral neuropathy    both feet  . Positive D-dimer    a. significant elevation ,hospital 07/2010, etiology unclear. b. D Dimer chronically > 20.  . Skin  cancer   . Spinal stenosis    a. s/p surgical repair 2013  . Spinal stenosis of lumbar region   . Type II diabetes mellitus ([place])   . Ventral hernia     Past Surgical History:  Procedure Laterality Date  . ANTERIOR LAT LUMBAR FUSION  10/22/2017   Procedure: Lumbar two-three Lumbar three-four Lumbar four-five Anteriolateral lumbar interbody fusion with percutaneous pedicle screw fixation and infuse;  Surgeon: [name], MD;  Location: [facility];  Service: Neurosurgery;;  . APPLICATION OF ROBOTIC ASSISTANCE FOR SPINAL PROCEDURE  10/22/2017   Procedure: APPLICATION OF ROBOTIC ASSISTANCE FOR SPINAL PROCEDURE;  Surgeon: [name], MD;  Location: [facility];  Service: Neurosurgery;;  . BACK SURGERY    . CARDIAC CATHETERIZATION N/A 07/30/2016   Procedure: Left Heart Cath and Coronary Angiography;  Surgeon: Nelva Bush, MD;  Location: [place];  Service: Cardiovascular;  Laterality: N/A;  . CARDIAC CATHETERIZATION N/A 07/30/2016   Procedure: Coronary Stent Intervention;  Surgeon: Nelva Bush, MD;  Location: [place] [facility];  Service: Cardiovascular;  Laterality: N/A;  Mid CFX and MID RCA  . CARDIAC CATHETERIZATION N/A 07/30/2016   Procedure: Coronary Balloon Angioplasty;  Surgeon: Nelva Bush, MD;  Location: [place];  Service: Cardiovascular;  Laterality: N/A;  OM 1  . CARPAL TUNNEL RELEASE Left   . CATARACT EXTRACTION W/ INTRAOCULAR LENS  IMPLANT, BILATERAL Bilateral ~ 2014  . CERVICAL DISC SURGERY  1990s  . CORONARY ANGIOPLASTY WITH STENT PLACEMENT  05/2013  . CORONARY ANGIOPLASTY WITH STENT PLACEMENT  07/26/2013   DES to RCA extending to PDA    . CORONARY ANGIOPLASTY WITH STENT PLACEMENT  07/30/2016  . CORONARY ANGIOPLASTY WITH STENT PLACEMENT  2000   CAD  . DENTAL SURGERY  04/2016   "got infected; had to dig it out"  . EYE SURGERY    . KNEE ARTHROSCOPY Right 1990's   rt  . LEFT AND RIGHT HEART CATHETERIZATION WITH CORONARY ANGIOGRAM N/A 07/26/2013    Procedure: LEFT AND RIGHT HEART CATHETERIZATION WITH CORONARY ANGIOGRAM;  Surgeon: [name], MD;  Location: [facility];  Service: Cardiovascular;  Laterality: N/A;  . LEFT HEART CATH AND CORONARY ANGIOGRAPHY N/A 02/26/2017   Procedure: Left Heart Cath and Coronary Angiography;  Surgeon: Sherren Mocha, MD;  Location: [place] [facility];  Service: Cardiovascular;  Laterality: N/A;  . LEFT HEART CATHETERIZATION WITH CORONARY ANGIOGRAM N/A 05/19/2013   Procedure: LEFT HEART CATHETERIZATION WITH CORONARY ANGIOGRAM;  Surgeon: [name], MD;  Location: [facility] C[facility];  Service: Cardiovascular;  Laterality: N/A;  . LUMBAR LAMINECTOMY/DECOMPRESSION MICRODISCECTOMY  08/30/2012   Procedure: LUMBAR LAMINECTOMY/DECOMPRESSION MICRODISCECTOMY 2 LEVELS;  Surgeon: [name], MD;  Location: [place];  Service: Neurosurgery;  Laterality: Bilateral;  Bilateral Lumbar three-four Lumbar four-five Laminotomies  . LUMBAR PERCUTANEOUS PEDICLE SCREW 1 LEVEL Bilateral 10/22/2017   Procedure: LUMBAR PERCUTANEOUS PEDICLE SCREW 1 LEVEL;  Surgeon: [name], MD;  Location: [facility];  Service: Neurosurgery;  Laterality: Bilateral;  . LUMBAR SPINE SURGERY  04/07/2016   Dr. Ellene Route; "?ruptured disc"  . MELANOMA EXCISION Left 02/2011   forearm  . NASAL SINUS SURGERY  1970s   "cut windows in sinus pockets"  . PERCUTANEOUS CORONARY STENT INTERVENTION (PCI-S)  05/19/2013   Procedure: PERCUTANEOUS CORONARY STENT INTERVENTION (PCI-S);  Surgeon: [name], MD;  Location[facility] CATH LAB;  Service: Cardiovascular;;  . ULNAR TUNNEL RELEASE Left 04/07/2016   Dr. Ellene Route    There were no vitals filed for this visit.  Subjective Assessment - 04/05/18 1330    Subjective  Hurting more in the Rt hip and back area today. Started hurting Sunday. REmembered he almost tripped Saturday and had to catch himself - this may have irritated symptoms. Some better following treatment.     Currently in Pain?  Yes    Pain Score  5      Pain Location  Back    Pain Orientation  Right;Posterior    Pain Descriptors / Indicators  Tightness;Aching;Sharp;Sore                       [facility] Adult PT Treatment/Exercise - 04/05/18 0001      Lumbar Exercises: Stretches   Quad Stretch  Right;Left;3 reps;60 seconds prone with PT assist        Lumbar Exercises: Aerobic   Nustep  L5 x 5 min       Lumbar Exercises: Standing   Other Standing Lumbar Exercises  walking laps between exercises/stretching VC for posture and alignment     Other Standing Lumbar Exercises  hip abduction leading with heel x 10 each LE; hip extension knee straight x 10 each LE x 2 sets  each LE       Moist Heat Therapy   Number Minutes Moist Heat  20 Minutes    Moist Heat Location  Lumbar Spine bilat quads       Electrical Stimulation   Electrical Stimulation Location  Rt Lumbar to Rt posterior hip     Electrical Stimulation Action  IFC    Electrical Stimulation Parameters  to tolerance    Electrical Stimulation Goals  Pain;Tone      Ultrasound   Ultrasound Location  Rt posterior lateral hip     Ultrasound Parameters  1.5 w/cm2; 100%; 1 mHz; 8 min     Ultrasound Goals  Pain;Other (Comment) muacular tightness       Manual Therapy   Manual therapy comments  pt prone    Soft tissue mobilization  deep tissue work through the posterior Rt hip - piriformis and glut musculature                   PT Long Term Goals - 03/29/18 1218      PT LONG TERM GOAL #1   Title  Improve LE mobility and ROM to WFL's thus decreasing muscular tightness and cramping/pain 05/05/18    Time  12    Period  Weeks    Status  On-going      PT LONG TERM GOAL #2   Title  Increase strength bilat LE's to 4+/5 to 5-/5 throughout 05/05/18    Time  12    Period  Weeks    Status  On-going      PT LONG TERM GOAL #3   Title  Progress with ADL's and functional activities with plan to return to boating and fishing 05/05/18    Time  12    Period  Weeks     Status  On-going      PT LONG TERM GOAL #4   Title  Independent in HEP 05/05/18    Time  12    Period  Weeks    Status  On-going      PT LONG TERM GOAL #5   Title  Improve FOTO to </= 56% limitation 05/05/18    Time  12    Period  Weeks    Status  On-going            Plan - 04/05/18 1335    Clinical Impression Statement  Increased pain in the Rt LB and hip area fwith some pain radiating into the Rt foot today. Pain may be related to near fall Saturday followed by sedentary day Sunday. Patient responded well to intervention in clinic today and was encouraged to continue with HEP and walking. No DN today.     Rehab Potential  Good    PT Frequency  2x / week    PT Duration  12 weeks    PT Treatment/Interventions  Patient/family education;ADLs/Self Care Home Management;Cryotherapy;Electrical Stimulation;Iontophoresis 4mg /ml Dexamethasone;Moist Heat;Ultrasound;Dry needling;Manual techniques;Neuromuscular re-education;Gait training;Functional mobility training;Therapeutic activities;Therapeutic exercise;Balance training    PT Next Visit Plan  continue progressing core stabilization and stretching for LE. Add bridging to HEP if tolerated.  continue DN as indicated     Consulted and Agree with Plan of Care  Patient       Patient will benefit from skilled therapeutic intervention in order to improve the following deficits and impairments:  Postural dysfunction, Improper body mechanics, Pain, Increased fascial restricitons, Increased muscle spasms, Decreased mobility, Decreased range of motion, Decreased strength, Decreased activity tolerance  Visit Diagnosis:  Chronic bilateral low back pain with bilateral sciatica  Muscle weakness (generalized)  Other symptoms and signs involving the musculoskeletal system     Problem List Patient Active Problem List   Diagnosis Date Noted  . Anemia 11/07/2017  . Constipation 11/07/2017  . Lumbar stenosis with neurogenic claudication 10/22/2017   . Back pain 08/29/2017  . Right-sided low back pain with right-sided sciatica 11/22/2016  . Pressure injury of skin 08/03/2016  . Dyspnea 08/01/2016  . NSTEMI (non-ST elevated myocardial infarction) ([place]) 07/30/2016  . CAD in native artery   . Obesity   . Uncontrolled type 2 diabetes mellitus with circulatory disorder, with long-term current use of insulin ([place]) 11/28/2015  . Lumbar radiculopathy 10/09/2015  . Neuropathy, diabetic ([place]) 05/22/2015  . H/O diverticulitis of colon 01/31/2015  . AAA (abdominal aortic aneurysm) ([place]) 01/01/2015  . Sinusitis 10/10/2014  . Hip pain, bilateral 07/05/2014  . Palpitations 02/27/2014  . Claudication of lower extremity ([place]) 10/25/2013  . BPH with obstruction/lower urinary tract symptoms 10/25/2013  . Hypertriglyceridemia 08/15/2012  . Melanoma ([place])   . Dizziness   . Renal insufficiency   . Coronary artery disease with exertional angina ([place])   . Hyperlipidemia   . Essential hypertension   . OSA (obstructive sleep apnea)   . Ejection fraction   . SOB (shortness of breath)   . PARESTHESIA 05/30/2009  . EDEMA 01/22/2009  . OTHER ABNORMAL BLOOD CHEMISTRY 04/25/2008  . Gout 10/21/2007  . DEPRESSION 10/21/2007  . VENTRAL HERNIA 10/21/2007  . HIATAL HERNIA 10/21/2007  . FATIGUE 10/21/2007  . SKIN CANCER, HX OF 10/21/2007  . NEPHROLITHIASIS, HX OF 10/21/2007    Alvie Speltz Nilda Simmer PT, MPH  04/05/2018, 1:39 PM  [facility] [place], [place], [address] Phone: [phone]   Fax:  [phone]  Name: William Hendrix MRN: [id number] Date of Birth: [birth year]

## 2018-04-07 ENCOUNTER — Ambulatory Visit: Payer: Medicare HMO | Admitting: Rehabilitative and Restorative Service Providers"

## 2018-04-07 ENCOUNTER — Encounter: Payer: Self-pay | Admitting: Rehabilitative and Restorative Service Providers"

## 2018-04-07 DIAGNOSIS — M6281 Muscle weakness (generalized): Secondary | ICD-10-CM | POA: Diagnosis not present

## 2018-04-07 DIAGNOSIS — R29898 Other symptoms and signs involving the musculoskeletal system: Secondary | ICD-10-CM

## 2018-04-07 DIAGNOSIS — G8929 Other chronic pain: Secondary | ICD-10-CM | POA: Diagnosis not present

## 2018-04-07 DIAGNOSIS — M5442 Lumbago with sciatica, left side: Secondary | ICD-10-CM

## 2018-04-07 DIAGNOSIS — M5441 Lumbago with sciatica, right side: Secondary | ICD-10-CM

## 2018-04-07 NOTE — Therapy (Addendum)
[place], [place], [address] Phone: [phone]   Fax:  [phone]  Physical Therapy Treatment  Patient Details  Name: William Hendrix MRN: [id number] Date of Birth: [birth year] Referring Provider: Dr Penni Homans   Encounter Date: 04/07/2018  PT End of Session - 04/07/18 1402    Visit Number  16    Number of Visits  20    Date for PT Re-Evaluation  05/05/18    PT Start Time  1401    PT Stop Time  1500    PT Time Calculation (min)  59 min    Activity Tolerance  Patient tolerated treatment well       Past Medical History:  Diagnosis Date  . AAA (abdominal aortic aneurysm) ([place]) 12/15/2014   a. Mild aneurysmal dilatation of the infrarenal abdominal aorta 3.2 cm - f/u due by 2019.  . Arthritis    "all over" (07/30/2016)  . CAD (coronary artery disease)    a. stent to LAD 2000. b. possible spasm by cath 2001. c. IVUS/PTCA/DES to mLAD 05/2013. d. PTCA/DES of dRCA into ostial rPDA 07/2013. e. PTCA of OM2, DES to Cx, DES to [facility] 07/2016.  Marland Kitchen Chronic bronchitis ([place])   . Chronic diastolic CHF (congestive heart failure) ([place])   . Chronic lower back pain   . Chronic neck pain   . CKD (chronic kidney disease), stage III ([place])   . Diabetic peripheral neuropathy ([place])   . Ejection fraction    55%, 07/2010, mild inferior hypo  . GERD (gastroesophageal reflux disease)   . Gout   . H/O diverticulitis of colon 01/31/2015  . H/O hiatal hernia   . History of blood transfusion ~ 10/1940   "had pneumonia"  . History of kidney stones   . HTN (hypertension)   . Hyperlipidemia   . Melanoma of forearm, left ([place]) 02/2011   with wide excision   . Myocardial infarction ([place])   . Nephrolithiasis   . Obesity   . OSA on CPAP     [name] since 2000  . Peripheral neuropathy    both feet  . Positive D-dimer    a. significant elevation ,hospital 07/2010, etiology unclear. b. D Dimer chronically > 20.  . Skin  cancer   . Spinal stenosis    a. s/p surgical repair 2013  . Spinal stenosis of lumbar region   . Type II diabetes mellitus ([place])   . Ventral hernia     Past Surgical History:  Procedure Laterality Date  . ANTERIOR LAT LUMBAR FUSION  10/22/2017   Procedure: Lumbar two-three Lumbar three-four Lumbar four-five Anteriolateral lumbar interbody fusion with percutaneous pedicle screw fixation and infuse;  Surgeon: [name], MD;  Location: [facility];  Service: Neurosurgery;;  . APPLICATION OF ROBOTIC ASSISTANCE FOR SPINAL PROCEDURE  10/22/2017   Procedure: APPLICATION OF ROBOTIC ASSISTANCE FOR SPINAL PROCEDURE;  Surgeon: [name], MD;  Location: [facility];  Service: Neurosurgery;;  . BACK SURGERY    . CARDIAC CATHETERIZATION N/A 07/30/2016   Procedure: Left Heart Cath and Coronary Angiography;  Surgeon: Nelva Bush, MD;  Location: [facility] CV LAB;  Service: Cardiovascular;  Laterality: N/A;  . CARDIAC CATHETERIZATION N/A 07/30/2016   Procedure: Coronary Stent Intervention;  Surgeon: Nelva Bush, MD;  Location: [facility] CV LAB;  Service: Cardiovascular;  Laterality: N/A;  Mid CFX and MID RCA  . CARDIAC CATHETERIZATION N/A 07/30/2016   Procedure: Coronary Balloon Angioplasty;  Surgeon: Nelva Bush, MD;  Location: [facility] CV LAB;  Service: Cardiovascular;  Laterality: N/A;  OM 1  . CARPAL TUNNEL RELEASE Left   . CATARACT EXTRACTION W/ INTRAOCULAR LENS  IMPLANT, BILATERAL Bilateral ~ 2014  . CERVICAL DISC SURGERY  1990s  . CORONARY ANGIOPLASTY WITH STENT PLACEMENT  05/2013  . CORONARY ANGIOPLASTY WITH STENT PLACEMENT  07/26/2013   DES to RCA extending to PDA    . CORONARY ANGIOPLASTY WITH STENT PLACEMENT  07/30/2016  . CORONARY ANGIOPLASTY WITH STENT PLACEMENT  2000   CAD  . DENTAL SURGERY  04/2016   "got infected; had to dig it out"  . EYE SURGERY    . KNEE ARTHROSCOPY Right 1990's   rt  . LEFT AND RIGHT HEART CATHETERIZATION WITH CORONARY ANGIOGRAM N/A 07/26/2013    Procedure: LEFT AND RIGHT HEART CATHETERIZATION WITH CORONARY ANGIOGRAM;  Surgeon: [name], MD;  Location: [facility] CATH LAB;  Service: Cardiovascular;  Laterality: N/A;  . LEFT HEART CATH AND CORONARY ANGIOGRAPHY N/A 02/26/2017   Procedure: Left Heart Cath and Coronary Angiography;  Surgeon: Sherren Mocha, MD;  Location: [facility] CV LAB;  Service: Cardiovascular;  Laterality: N/A;  . LEFT HEART CATHETERIZATION WITH CORONARY ANGIOGRAM N/A 05/19/2013   Procedure: LEFT HEART CATHETERIZATION WITH CORONARY ANGIOGRAM;  Surgeon: [name], MD;  Location: [facility] CATH LAB;  Service: Cardiovascular;  Laterality: N/A;  . LUMBAR LAMINECTOMY/DECOMPRESSION MICRODISCECTOMY  08/30/2012   Procedure: LUMBAR LAMINECTOMY/DECOMPRESSION MICRODISCECTOMY 2 LEVELS;  Surgeon: [name], MD;  Location: [facility] NEURO ORS;  Service: Neurosurgery;  Laterality: Bilateral;  Bilateral Lumbar three-four Lumbar four-five Laminotomies  . LUMBAR PERCUTANEOUS PEDICLE SCREW 1 LEVEL Bilateral 10/22/2017   Procedure: LUMBAR PERCUTANEOUS PEDICLE SCREW 1 LEVEL;  Surgeon: [name], MD;  Location: [facility];  Service: Neurosurgery;  Laterality: Bilateral;  . LUMBAR SPINE SURGERY  04/07/2016   Dr. Ellene Route; "?ruptured disc"  . MELANOMA EXCISION Left 02/2011   forearm  . NASAL SINUS SURGERY  1970s   "cut windows in sinus pockets"  . PERCUTANEOUS CORONARY STENT INTERVENTION (PCI-S)  05/19/2013   Procedure: PERCUTANEOUS CORONARY STENT INTERVENTION (PCI-S);  Surgeon: [name], MD;  Location: [facility] CATH LAB;  Service: Cardiovascular;;  . ULNAR TUNNEL RELEASE Left 04/07/2016   Dr. Ellene Route    There were no vitals filed for this visit.  Subjective Assessment - 04/07/18 1403    Subjective  Continues to have pain in the Rt LB area. He did sleep most of the night last night for the first time in several days. He and his wife have been working on the quad stretching and using the TENS unit on the back. Patient reports that he is ~60% improved  overall.     Currently in Pain?  Yes    Pain Score  5     Pain Location  Back    Pain Orientation  Right;Posterior    Pain Descriptors / Indicators  Tightness;Aching;Sharp;Sore    Pain Type  Chronic pain    Pain Onset  More than a month ago    Pain Frequency  Constant         [facility] PT Assessment - 04/07/18 0001      Assessment   Medical Diagnosis  Back pain     Referring Provider  Dr Penni Homans    Onset Date/Surgical Date  10/22/17    Hand Dominance  Right    Next MD Visit  to schedule     Prior Therapy  yes in the past for LB - chiropractic  care with no improvement - none since surgery       Strength   Right Hip Flexion  4+/5    Right Hip Extension  -- hip extension in prone 4-/5    Right Hip ABduction  4+/5    Left Hip Flexion  4+/5    Left Hip Extension  -- hip extension in prone 4-/5    Left Hip ABduction  4+/5    Right Knee Flexion  5/5    Right Knee Extension  -- 5-/5    Left Knee Flexion  5/5    Left Knee Extension  -- 5-/5      Flexibility   Quadriceps  prone knee flexion ~ 95-100 deg bilat       Palpation   Palpation comment  muscular tightness though lumbar spine into posterior hips Rt > Lt; quads; hamstrings                    [facility] Adult PT Treatment/Exercise - 04/07/18 0001      Lumbar Exercises: Stretches   Quad Stretch  Right;Left;3 reps;60 seconds prone with PT assist        Lumbar Exercises: Aerobic   Nustep  L5 x 6 min       Lumbar Exercises: Standing   Other Standing Lumbar Exercises  walking laps between exercises/stretching VC for posture and alignment     Other Standing Lumbar Exercises  hip abduction leading with heel x 10 each LE; hip extension knee straight x 10 each LE x 2 sets each LE       Lumbar Exercises: Supine   Other Supine Lumbar Exercises  3 part core 10 sec hold x 10 2-3 sets       Moist Heat Therapy   Number Minutes Moist Heat  20 Minutes    Moist Heat Location  Lumbar Spine bilat quads       Electrical  Stimulation   Electrical Stimulation Location  Rt Lumbar to Rt posterior hip     Electrical Stimulation Action  IFC    Electrical Stimulation Parameters  to tolerance    Electrical Stimulation Goals  Pain;Tone      Manual Therapy   Manual therapy comments  pt prone     Soft tissue mobilization  deep tissue work through Rt lumbar and posterior Rt hip - piriformis and glut musculature        Trigger Point Dry Needling - 04/07/18 1447    Consent Given?  Yes    Muscles Treated Lower Body  -- Rt with estim glut medius     Gluteus Maximus Response  Palpable increased muscle length    Piriformis Response  Palpable increased muscle length                PT Long Term Goals - 04/07/18 1408      PT LONG TERM GOAL #1   Title  Improve LE mobility and ROM to WFL's thus decreasing muscular tightness and cramping/pain 05/05/18    Time  12    Period  Weeks    Status  Partially Met      PT LONG TERM GOAL #2   Title  Increase strength bilat LE's to 4+/5 to 5-/5 throughout 05/05/18    Time  12    Period  Weeks    Status  Partially Met      PT LONG TERM GOAL #3   Title  Progress with ADL's and functional activities with plan to  return to boating and fishing 05/05/18    Time  12    Period  Weeks    Status  Partially Met      PT LONG TERM GOAL #4   Title  Independent in HEP 05/05/18    Time  12    Period  Weeks    Status  On-going      PT LONG TERM GOAL #5   Title  Improve FOTO to </= 56% limitation 05/05/18    Time  12    Period  Weeks    Status  On-going            Plan - 04/07/18 1448    Clinical Impression Statement  Patient continues to experience increased pain in the Rt LB and posterior hip. He did sleep through the night - better than he has in several days/weeks. Patient reports increased pain this am. Discussed use of lumbar support for periods of flare up during times of increased demand. Patient responded well to the DN and manual work today with less pain and  decresed palpable tightness. Patient is likely increasing activities causing some increased pain. Also discussed the position he is in for home stretching.     Rehab Potential  Good    PT Frequency  2x / week    PT Duration  12 weeks    PT Treatment/Interventions  Patient/family education;ADLs/Self Care Home Management;Cryotherapy;Electrical Stimulation;Iontophoresis 54m/ml Dexamethasone;Moist Heat;Ultrasound;Dry needling;Manual techniques;Neuromuscular re-education;Gait training;Functional mobility training;Therapeutic activities;Therapeutic exercise;Balance training    PT Next Visit Plan  continue progressing core stabilization and stretching for LE. Add bridging to HEP if tolerated.  continue DN as indicated     Consulted and Agree with Plan of Care  Patient       Patient will benefit from skilled therapeutic intervention in order to improve the following deficits and impairments:  Postural dysfunction, Improper body mechanics, Pain, Increased fascial restricitons, Increased muscle spasms, Decreased mobility, Decreased range of motion, Decreased strength, Decreased activity tolerance  Visit Diagnosis: Chronic bilateral low back pain with bilateral sciatica  Muscle weakness (generalized)  Other symptoms and signs involving the musculoskeletal system     Problem List Patient Active Problem List   Diagnosis Date Noted  . Anemia 11/07/2017  . Constipation 11/07/2017  . Lumbar stenosis with neurogenic claudication 10/22/2017  . Back pain 08/29/2017  . Right-sided low back pain with right-sided sciatica 11/22/2016  . Pressure injury of skin 08/03/2016  . Dyspnea 08/01/2016  . NSTEMI (non-ST elevated myocardial infarction) ([place] 07/30/2016  . CAD in native artery   . Obesity   . Uncontrolled type 2 diabetes mellitus with circulatory disorder, with long-term current use of insulin ([place] 11/28/2015  . Lumbar radiculopathy 10/09/2015  . Neuropathy, diabetic ([place] 05/22/2015  . H/O  diverticulitis of colon 01/31/2015  . AAA (abdominal aortic aneurysm) ([place] 01/01/2015  . Sinusitis 10/10/2014  . Hip pain, bilateral 07/05/2014  . Palpitations 02/27/2014  . Claudication of lower extremity ([place] 10/25/2013  . BPH with obstruction/lower urinary tract symptoms 10/25/2013  . Hypertriglyceridemia 08/15/2012  . Melanoma ([place]   . Dizziness   . Renal insufficiency   . Coronary artery disease with exertional angina ([place]   . Hyperlipidemia   . Essential hypertension   . OSA (obstructive sleep apnea)   . Ejection fraction   . SOB (shortness of breath)   . PARESTHESIA 05/30/2009  . EDEMA 01/22/2009  . OTHER ABNORMAL BLOOD CHEMISTRY 04/25/2008  . Gout 10/21/2007  . DEPRESSION 10/21/2007  .  VENTRAL HERNIA 10/21/2007  . HIATAL HERNIA 10/21/2007  . FATIGUE 10/21/2007  . SKIN CANCER, HX OF 10/21/2007  . NEPHROLITHIASIS, HX OF 10/21/2007    Paiten Boies Nilda Simmer PT, MPH  04/07/2018, 2:55 PM  [facility] [address] [place], [place], [address] Phone: [phone]   Fax:  [phone]  Name: William Hendrix MRN: [id number] Date of Birth: October 21, 1940  PHYSICAL THERAPY DISCHARGE SUMMARY  Visits from Start of Care: 16  Current functional level related to goals / functional outcomes: See progress note for discharge status   Remaining deficits: Unknown - called to report increase in pain and was planning to return to MD    Education / Equipment: HEP  Plan: Patient agrees to discharge.  Patient goals were partially met. Patient is being discharged due to a change in medical status.  ?????    Leilene Diprima P. Helene Kelp PT, MPH 05/06/18 1:08 PM

## 2018-04-19 ENCOUNTER — Encounter: Payer: Medicare HMO | Admitting: Rehabilitative and Restorative Service Providers"

## 2018-04-20 ENCOUNTER — Ambulatory Visit (HOSPITAL_BASED_OUTPATIENT_CLINIC_OR_DEPARTMENT_OTHER)
Admission: RE | Admit: 2018-04-20 | Discharge: 2018-04-20 | Disposition: A | Discharge status: Home / Self Care | Payer: Medicare HMO | Source: Ambulatory Visit | Attending: Neurological Surgery | Admitting: Neurological Surgery

## 2018-04-20 ENCOUNTER — Other Ambulatory Visit (HOSPITAL_BASED_OUTPATIENT_CLINIC_OR_DEPARTMENT_OTHER): Payer: Self-pay | Admitting: Neurological Surgery

## 2018-04-20 DIAGNOSIS — M47816 Spondylosis without myelopathy or radiculopathy, lumbar region: Secondary | ICD-10-CM | POA: Diagnosis not present

## 2018-04-20 DIAGNOSIS — M79604 Pain in right leg: Secondary | ICD-10-CM | POA: Insufficient documentation

## 2018-04-20 DIAGNOSIS — M79661 Pain in right lower leg: Secondary | ICD-10-CM | POA: Diagnosis not present

## 2018-04-20 DIAGNOSIS — M79662 Pain in left lower leg: Secondary | ICD-10-CM | POA: Diagnosis not present

## 2018-04-20 DIAGNOSIS — M79605 Pain in left leg: Secondary | ICD-10-CM | POA: Insufficient documentation

## 2018-04-20 DIAGNOSIS — M25551 Pain in right hip: Secondary | ICD-10-CM | POA: Diagnosis not present

## 2018-04-20 DIAGNOSIS — M5416 Radiculopathy, lumbar region: Secondary | ICD-10-CM | POA: Diagnosis not present

## 2018-04-21 ENCOUNTER — Encounter: Payer: Medicare HMO | Admitting: Rehabilitative and Restorative Service Providers"

## 2018-04-22 ENCOUNTER — Other Ambulatory Visit (HOSPITAL_BASED_OUTPATIENT_CLINIC_OR_DEPARTMENT_OTHER): Payer: Self-pay | Admitting: Neurological Surgery

## 2018-04-22 DIAGNOSIS — M5416 Radiculopathy, lumbar region: Secondary | ICD-10-CM

## 2018-04-23 ENCOUNTER — Emergency Department (HOSPITAL_BASED_OUTPATIENT_CLINIC_OR_DEPARTMENT_OTHER)
Admission: EM | Admit: 2018-04-23 | Discharge: 2018-04-23 | Disposition: A | Discharge status: Home / Self Care | Payer: Medicare HMO | Attending: Emergency Medicine | Admitting: Emergency Medicine

## 2018-04-23 ENCOUNTER — Emergency Department (HOSPITAL_BASED_OUTPATIENT_CLINIC_OR_DEPARTMENT_OTHER): Payer: Medicare HMO

## 2018-04-23 ENCOUNTER — Other Ambulatory Visit: Payer: Self-pay

## 2018-04-23 ENCOUNTER — Encounter (HOSPITAL_BASED_OUTPATIENT_CLINIC_OR_DEPARTMENT_OTHER): Payer: Self-pay | Admitting: *Deleted

## 2018-04-23 DIAGNOSIS — N183 Chronic kidney disease, stage 3 (moderate): Secondary | ICD-10-CM | POA: Insufficient documentation

## 2018-04-23 DIAGNOSIS — E785 Hyperlipidemia, unspecified: Secondary | ICD-10-CM | POA: Diagnosis not present

## 2018-04-23 DIAGNOSIS — Z87891 Personal history of nicotine dependence: Secondary | ICD-10-CM | POA: Insufficient documentation

## 2018-04-23 DIAGNOSIS — G8929 Other chronic pain: Secondary | ICD-10-CM | POA: Diagnosis not present

## 2018-04-23 DIAGNOSIS — Z79899 Other long term (current) drug therapy: Secondary | ICD-10-CM | POA: Diagnosis not present

## 2018-04-23 DIAGNOSIS — M545 Low back pain: Secondary | ICD-10-CM | POA: Diagnosis not present

## 2018-04-23 DIAGNOSIS — S99921A Unspecified injury of right foot, initial encounter: Secondary | ICD-10-CM | POA: Diagnosis not present

## 2018-04-23 DIAGNOSIS — I252 Old myocardial infarction: Secondary | ICD-10-CM | POA: Insufficient documentation

## 2018-04-23 DIAGNOSIS — Z794 Long term (current) use of insulin: Secondary | ICD-10-CM | POA: Insufficient documentation

## 2018-04-23 DIAGNOSIS — R1084 Generalized abdominal pain: Secondary | ICD-10-CM | POA: Diagnosis not present

## 2018-04-23 DIAGNOSIS — I2510 Atherosclerotic heart disease of native coronary artery without angina pectoris: Secondary | ICD-10-CM | POA: Diagnosis not present

## 2018-04-23 DIAGNOSIS — Z7982 Long term (current) use of aspirin: Secondary | ICD-10-CM | POA: Diagnosis not present

## 2018-04-23 DIAGNOSIS — R200 Anesthesia of skin: Secondary | ICD-10-CM | POA: Diagnosis not present

## 2018-04-23 DIAGNOSIS — I5032 Chronic diastolic (congestive) heart failure: Secondary | ICD-10-CM | POA: Diagnosis not present

## 2018-04-23 DIAGNOSIS — I130 Hypertensive heart and chronic kidney disease with heart failure and stage 1 through stage 4 chronic kidney disease, or unspecified chronic kidney disease: Secondary | ICD-10-CM | POA: Insufficient documentation

## 2018-04-23 DIAGNOSIS — R109 Unspecified abdominal pain: Secondary | ICD-10-CM | POA: Diagnosis not present

## 2018-04-23 DIAGNOSIS — R1032 Left lower quadrant pain: Secondary | ICD-10-CM | POA: Diagnosis not present

## 2018-04-23 DIAGNOSIS — E1140 Type 2 diabetes mellitus with diabetic neuropathy, unspecified: Secondary | ICD-10-CM | POA: Diagnosis not present

## 2018-04-23 LAB — CBC WITH DIFFERENTIAL/PLATELET
BASOS ABS: 0 10*3/uL (ref 0.0–0.1)
Basophils Relative: 0 %
EOS ABS: 0.2 10*3/uL (ref 0.0–0.7)
EOS PCT: 2 %
HCT: 40.8 % (ref 39.0–52.0)
Hemoglobin: 14.4 g/dL (ref 13.0–17.0)
Lymphocytes Relative: 42 %
Lymphs Abs: 3.5 10*3/uL (ref 0.7–4.0)
MCH: 32.3 pg (ref 26.0–34.0)
MCHC: 35.3 g/dL (ref 30.0–36.0)
MCV: 91.5 fL (ref 78.0–100.0)
Monocytes Absolute: 0.6 10*3/uL (ref 0.1–1.0)
Monocytes Relative: 8 %
Neutro Abs: 4 10*3/uL (ref 1.7–7.7)
Neutrophils Relative %: 48 %
PLATELETS: 216 10*3/uL (ref 150–400)
RBC: 4.46 MIL/uL (ref 4.22–5.81)
RDW: 14.2 % (ref 11.5–15.5)
WBC: 8.3 10*3/uL (ref 4.0–10.5)

## 2018-04-23 LAB — COMPREHENSIVE METABOLIC PANEL
ALT: 22 U/L (ref 0–44)
AST: 29 U/L (ref 15–41)
Albumin: 4.4 g/dL (ref 3.5–5.0)
Alkaline Phosphatase: 56 U/L (ref 38–126)
Anion gap: 11 (ref 5–15)
BUN: 31 mg/dL — AB (ref 8–23)
CO2: 25 mmol/L (ref 22–32)
CREATININE: 1.82 mg/dL — AB (ref 0.61–1.24)
Calcium: 10 mg/dL (ref 8.9–10.3)
Chloride: 105 mmol/L (ref 98–111)
GFR calc non Af Amer: 34 mL/min — ABNORMAL LOW (ref >60)
GFR, EST AFRICAN AMERICAN: 39 mL/min — AB (ref >60)
GLUCOSE: 84 mg/dL (ref 70–99)
Potassium: 3.6 mmol/L (ref 3.5–5.1)
SODIUM: 141 mmol/L (ref 135–145)
Total Bilirubin: 0.5 mg/dL (ref 0.3–1.2)
Total Protein: 7.8 g/dL (ref 6.5–8.1)

## 2018-04-23 LAB — LIPASE, BLOOD: Lipase: 36 U/L (ref 11–51)

## 2018-04-23 MED ORDER — HYDROMORPHONE HCL 1 MG/ML IJ SOLN
1.0000 mg | Freq: Once | INTRAMUSCULAR | Status: AC
  Administered 2018-04-23: 1 mg via INTRAVENOUS
  Filled 2018-04-23: qty 1

## 2018-04-23 MED ORDER — SODIUM CHLORIDE 0.9 % IV SOLN
INTRAVENOUS | Status: DC
  Administered 2018-04-23: 75 mL/h via INTRAVENOUS

## 2018-04-23 MED ORDER — IOPAMIDOL (ISOVUE-300) INJECTION 61%
100.0000 mL | Freq: Once | INTRAVENOUS | Status: AC | PRN
  Administered 2018-04-23: 80 mL via INTRAVENOUS

## 2018-04-23 MED ORDER — ONDANSETRON HCL 4 MG/2ML IJ SOLN
4.0000 mg | Freq: Once | INTRAMUSCULAR | Status: AC
  Administered 2018-04-23: 4 mg via INTRAVENOUS
  Filled 2018-04-23: qty 2

## 2018-04-23 NOTE — ED Triage Notes (Signed)
Pt had back surgery in January. Reports increasing back pain with movement for the last few days. Pt ambulated into ED with slow steady gait

## 2018-04-23 NOTE — Discharge Instructions (Addendum)
Follow-up with your neurosurgeon.  Continue your current pain medications.  Today's work-up without any acute abdominal findings.  No obvious lumbar spine findings.  That would be on CAT scan.  In addition there was no evidence of any bony injuries to the lumbar spine area.

## 2018-04-23 NOTE — ED Notes (Signed)
Pt verbalized not relief with 1 mg Dilaudid given. EDP to be notified. Pt transported to CT.

## 2018-04-23 NOTE — ED Provider Notes (Signed)
[facility] Provider Note   CSN: [id number] Arrival date & time: 04/23/18  1907     History   Chief Complaint Chief Complaint  Patient presents with  . Back Pain    HPI William Hendrix is a 77 y.o. male.  Patient with chronic lumbar back pain mostly on the right side.  Patient status post back surgery in January by Dr. Ellene Route.  Had an anterior lateral lumbar fusion.  Was done January 11.  Since that time patient has struggled with pain is gotten worse in the last couple weeks.  Patient has been finally going to rehab.  No new fall or injury.  Dr. Ellene Route 2 months ago did an MRI follow-up without any specific findings.  He did Doppler studies recently to rule out a DVT and they were negative.  He was planning on doing a specific lumbar CT scan.  That has not occurred yet.  The patient states that he has some right lower quadrant abdominal pain as well but does have increased pain with movement of the right leg.  No nausea vomiting or diarrhea.  Patient is on hydrocodone 10 mg at home.  Patient also has concerns for possible infection in his right great toe.  States that there was more redness and swelling a few days ago it seems to be improving but he has baseline numbness in that foot long-term nothing new or acute actually has him in both feet.     Past Medical History:  Diagnosis Date  . AAA (abdominal aortic aneurysm) ([place]) 12/15/2014   a. Mild aneurysmal dilatation of the infrarenal abdominal aorta 3.2 cm - f/u due by 2019.  . Arthritis    "all over" (07/30/2016)  . CAD (coronary artery disease)    a. stent to LAD 2000. b. possible spasm by cath 2001. c. IVUS/PTCA/DES to mLAD 05/2013. d. PTCA/DES of dRCA into ostial rPDA 07/2013. e. PTCA of OM2, DES to Cx, DES to [facility] 07/2016.  Marland Kitchen Chronic bronchitis ([place])   . Chronic diastolic CHF (congestive heart failure) ([place])   . Chronic lower back pain   . Chronic neck pain   . CKD (chronic kidney disease),  stage III ([place])   . Diabetic peripheral neuropathy ([place])   . Ejection fraction    55%, 07/2010, mild inferior hypo  . GERD (gastroesophageal reflux disease)   . Gout   . H/O diverticulitis of colon 01/31/2015  . H/O hiatal hernia   . History of blood transfusion ~ 10/1940   "had pneumonia"  . History of kidney stones   . HTN (hypertension)   . Hyperlipidemia   . Melanoma of forearm, left ([place]) 02/2011   with wide excision   . Myocardial infarction ([place])   . Nephrolithiasis   . Obesity   . OSA on CPAP     [name] since 2000  . Peripheral neuropathy    both feet  . Positive D-dimer    a. significant elevation ,hospital 07/2010, etiology unclear. b. D Dimer chronically > 20.  Marland Kitchen Renal insufficiency   . Skin cancer   . Spinal stenosis    a. s/p surgical repair 2013  . Spinal stenosis of lumbar region   . Type II diabetes mellitus ([place])   . Ventral hernia     Patient Active Problem List   Diagnosis Date Noted  . Anemia 11/07/2017  . Constipation 11/07/2017  . Lumbar stenosis with neurogenic claudication 10/22/2017  . Back pain 08/29/2017  .  Right-sided low back pain with right-sided sciatica 11/22/2016  . Pressure injury of skin 08/03/2016  . Dyspnea 08/01/2016  . NSTEMI (non-ST elevated myocardial infarction) ([place]) 07/30/2016  . CAD in native artery   . Obesity   . Uncontrolled type 2 diabetes mellitus with circulatory disorder, with long-term current use of insulin ([place]) 11/28/2015  . Lumbar radiculopathy 10/09/2015  . Neuropathy, diabetic ([place]) 05/22/2015  . H/O diverticulitis of colon 01/31/2015  . AAA (abdominal aortic aneurysm) ([place]) 01/01/2015  . Sinusitis 10/10/2014  . Hip pain, bilateral 07/05/2014  . Palpitations 02/27/2014  . Claudication of lower extremity ([place]) 10/25/2013  . BPH with obstruction/lower urinary tract symptoms 10/25/2013  . Hypertriglyceridemia 08/15/2012  . Melanoma ([place])   . Dizziness   . Renal insufficiency   . Coronary artery disease  with exertional angina ([place])   . Hyperlipidemia   . Essential hypertension   . OSA (obstructive sleep apnea)   . Ejection fraction   . SOB (shortness of breath)   . PARESTHESIA 05/30/2009  . EDEMA 01/22/2009  . OTHER ABNORMAL BLOOD CHEMISTRY 04/25/2008  . Gout 10/21/2007  . DEPRESSION 10/21/2007  . VENTRAL HERNIA 10/21/2007  . HIATAL HERNIA 10/21/2007  . FATIGUE 10/21/2007  . SKIN CANCER, HX OF 10/21/2007  . NEPHROLITHIASIS, HX OF 10/21/2007    Past Surgical History:  Procedure Laterality Date  . ANTERIOR LAT LUMBAR FUSION  10/22/2017   Procedure: Lumbar two-three Lumbar three-four Lumbar four-five Anteriolateral lumbar interbody fusion with percutaneous pedicle screw fixation and infuse;  Surgeon: [name], MD;  Location: [facility];  Service: Neurosurgery;;  . APPLICATION OF ROBOTIC ASSISTANCE FOR SPINAL PROCEDURE  10/22/2017   Procedure: APPLICATION OF ROBOTIC ASSISTANCE FOR SPINAL PROCEDURE;  Surgeon: [name], MD;  Location: [facility];  Service: Neurosurgery;;  . BACK SURGERY    . CARDIAC CATHETERIZATION N/A 07/30/2016   Procedure: Left Heart Cath and Coronary Angiography;  Surgeon: Nelva Bush, MD;  Location: [facility] CV LAB;  Service: Cardiovascular;  Laterality: N/A;  . CARDIAC CATHETERIZATION N/A 07/30/2016   Procedure: Coronary Stent Intervention;  Surgeon: Nelva Bush, MD;  Location: [facility] CV LAB;  Service: Cardiovascular;  Laterality: N/A;  Mid CFX and MID RCA  . CARDIAC CATHETERIZATION N/A 07/30/2016   Procedure: Coronary Balloon Angioplasty;  Surgeon: Nelva Bush, MD;  Location: [facility] CV LAB;  Service: Cardiovascular;  Laterality: N/A;  OM 1  . CARPAL TUNNEL RELEASE Left   . CATARACT EXTRACTION W/ INTRAOCULAR LENS  IMPLANT, BILATERAL Bilateral ~ 2014  . CERVICAL DISC SURGERY  1990s  . CORONARY ANGIOPLASTY WITH STENT PLACEMENT  05/2013  . CORONARY ANGIOPLASTY WITH STENT PLACEMENT  07/26/2013   DES to RCA extending to PDA    . CORONARY  ANGIOPLASTY WITH STENT PLACEMENT  07/30/2016  . CORONARY ANGIOPLASTY WITH STENT PLACEMENT  2000   CAD  . DENTAL SURGERY  04/2016   "got infected; had to dig it out"  . EYE SURGERY    . KNEE ARTHROSCOPY Right 1990's   rt  . LEFT AND RIGHT HEART CATHETERIZATION WITH CORONARY ANGIOGRAM N/A 07/26/2013   Procedure: LEFT AND RIGHT HEART CATHETERIZATION WITH CORONARY ANGIOGRAM;  Surgeon: [name], MD;  Location: [facility] CATH LAB;  Service: Cardiovascular;  Laterality: N/A;  . LEFT HEART CATH AND CORONARY ANGIOGRAPHY N/A 02/26/2017   Procedure: Left Heart Cath and Coronary Angiography;  Surgeon: Sherren Mocha, MD;  Location: [facility] CV LAB;  Service: Cardiovascular;  Laterality: N/A;  . LEFT HEART CATHETERIZATION WITH CORONARY ANGIOGRAM  N/A 05/19/2013   Procedure: LEFT HEART CATHETERIZATION WITH CORONARY ANGIOGRAM;  Surgeon: [name], MD;  [facility] LAB;  Service: Cardiovascular;  Laterality: N/A;  . LUMBAR LAMINECTOMY/DECOMPRESSION MICRODISCECTOMY  08/30/2012   Procedure: LUMBAR LAMINECTOMY/DECOMPRESSION MICRODISCECTOMY 2 LEVELS;  Surgeon: [name], MD;  Location: [facility] NEURO ORS;  Service: Neurosurgery;  Laterality: Bilateral;  Bilateral Lumbar three-four Lumbar four-five Laminotomies  . LUMBAR PERCUTANEOUS PEDICLE SCREW 1 LEVEL Bilateral 10/22/2017   Procedure: LUMBAR PERCUTANEOUS PEDICLE SCREW 1 LEVEL;  Surgeon: [name], MD;  Location: [facility];  Service: Neurosurgery;  Laterality: Bilateral;  . LUMBAR SPINE SURGERY  04/07/2016   Dr. Ellene Route; "?ruptured disc"  . MELANOMA EXCISION Left 02/2011   forearm  . NASAL SINUS SURGERY  1970s   "cut windows in sinus pockets"  . PERCUTANEOUS CORONARY STENT INTERVENTION (PCI-S)  05/19/2013   Procedure: PERCUTANEOUS CORONARY STENT INTERVENTION (PCI-S);  Surgeon: [name], MD;  Location: [facility] CATH LAB;  Service: Cardiovascular;;  . ULNAR TUNNEL RELEASE Left 04/07/2016   Dr. Ellene Route        Home Medications    Prior to  Admission medications   Medication Sig Start Date End Date Taking? Authorizing Provider  acetaminophen (TYLENOL) 500 MG tablet Take 1,000 mg by mouth 2 (two) times daily.    [provider]  albuterol (PROVENTIL HFA;VENTOLIN HFA) 108 (90 Base) MCG/ACT inhaler Inhale 2 puffs into the lungs every 6 (six) hours as needed for wheezing or shortness of breath. 10/30/16   Saguier, Percell Miller, PA-C  allopurinol (ZYLOPRIM) 300 MG tablet Take 1 tablet (300 mg total) by mouth daily. 03/22/18   [name], MD  aspirin EC 81 MG tablet Take 81 mg by mouth daily. 02/23/11   [name], MD  atorvastatin (LIPITOR) 40 MG tablet Take 1 tablet (40 mg total) by mouth daily. 01/24/18   [name], MD  B Complex Vitamins (B COMPLEX-B12 PO) Take 1 tablet by mouth at bedtime. 07/23/16   [provider]  BD INSULIN SYRINGE ULTRAFINE 31G X 5/16" 1 ML MISC USE  TO INJECT  INSULIN  FIVE  TIMES  DAILY AS INSTRUCTED 03/17/17   [name], MD  chlorpheniramine (CHLOR-TRIMETON) 4 MG tablet Take 4 mg by mouth daily.    [provider]  Cholecalciferol (VITAMIN D) 1000 UNITS capsule Take 1,000 Units by mouth at bedtime.     [provider]  fenofibrate 160 MG tablet Take 1 tablet (160 mg total) by mouth at bedtime. 01/24/18   [name], MD  fluocinonide cream (LIDEX) 5.73 % Apply 1 application topically daily as needed (for rash).  08/11/12   [provider]  fluticasone (FLONASE) 50 MCG/ACT nasal spray Place 2 sprays into both nostrils daily. 08/11/17   Saguier, Percell Miller, PA-C  furosemide (LASIX) 40 MG tablet TAKE 1 AND 1/2 TABLETS EVERY DAY Patient taking differently: Take 60 mg by mouth every day 07/12/17   Lelon Perla, MD  HYDROcodone-acetaminophen (NORCO/VICODIN) 5-325 MG tablet Take 1-2 tablets by mouth every 4 (four) hours as needed for severe pain ((score 7 to 10)). 10/25/17   [name], MD  insulin NPH Human (NOVOLIN N) 100 UNIT/ML injection Inject 45 units  in am and 55 in the evening Patient taking differently: Inject 45-50 Units into the skin See admin instructions. Inject 45 units SQ in the morning and inject 50 units SQ at bedtime 09/17/17   [name], MD  insulin regular (NOVOLIN R RELION) 100 units/mL injection Inject  0.25-0.3 mLs (25-30 Units total) into the skin 3 (three) times daily before meals. Patient taking differently: Inject 35 Units into the skin 3 (three) times daily before meals.  09/17/17   [name], MD  isosorbide mononitrate (IMDUR) 30 MG 24 hr tablet Take 1 tablet (30 mg total) by mouth daily. 01/18/18   Lelon Perla, MD  metoprolol tartrate (LOPRESSOR) 50 MG tablet Take 1 tablet (50 mg total) by mouth 2 (two) times daily. 01/24/18   [name], MD  mupirocin ointment Drue Stager) 2 % Apply to area thin film twice daily 09/29/17   Saguier, Percell Miller, PA-C  nitroGLYCERIN (NITROSTAT) 0.4 MG SL tablet Place 1 tablet (0.4 mg total) under the tongue every 5 (five) minutes as needed for chest pain. 02/16/18   Lelon Perla, MD  pantoprazole (PROTONIX) 20 MG tablet TAKE 1 TABLET (20 MG TOTAL) BY MOUTH DAILY. 12/30/17   [name], MD  Probiotic Product (PROBIOTIC PO) Take 1 capsule by mouth daily.    [provider]  tamsulosin (FLOMAX) 0.4 MG CAPS capsule Take 1 capsule (0.4 mg total) by mouth daily. 01/24/18   [name], MD  tiZANidine (ZANAFLEX) 4 MG capsule Take 1 capsule (4 mg total) by mouth 4 (four) times daily as needed for muscle spasms. Take 1 tablet every 6 hours as needed; not to exceed 3 doses in 24 hours. 02/09/17   [name], MD  TRUE METRIX BLOOD GLUCOSE TEST test strip USE TO TEST BLOOD SUGAR THREE TIMES DAILY AS DIRECTED 04/29/17   [name], MD    Family History Family History  Problem Relation Age of Onset  . Cancer Mother        intestinal   . Heart disease Mother   . Cancer Father        prostate  . Migraines Daughter   . Leukemia Sister   . Heart disease  Sister   . Prostate cancer Unknown   . Kidney cancer Unknown   . Cancer Unknown        Bladder cancer  . Coronary artery disease Unknown   . Hypertension Sister   . Hypertension Brother   . Heart attack Neg Hx   . Stroke Neg Hx     Social History Social History   Tobacco Use  . Smoking status: Former Smoker    Packs/day: 3.00    Years: 30.00    Pack years: 90.00    Types: Cigarettes    Last attempt to quit: 09/17/1996    Years since quitting: 21.6  . Smokeless tobacco: Never Used  Substance Use Topics  . Alcohol use: No  . Drug use: No     Allergies   Levemir [insulin detemir]; Codeine; Lipitor [atorvastatin]; Novolog mix [insulin aspart prot & aspart]; and Other   Review of Systems Review of Systems  Constitutional: Negative for fever.  HENT: Negative for congestion.   Eyes: Negative for visual disturbance.  Respiratory: Negative for shortness of breath.   Cardiovascular: Negative for chest pain.  Gastrointestinal: Positive for abdominal pain. Negative for diarrhea, nausea and vomiting.  Genitourinary: Negative for dysuria.  Musculoskeletal: Positive for back pain.  Skin: Positive for wound.  Neurological: Positive for numbness. Negative for headaches.  Hematological: Does not bruise/bleed easily.  Psychiatric/Behavioral: Negative for confusion.     Physical Exam Updated Vital Signs BP 135/62   Pulse 69   Resp 19   Ht 1.778 m (5\' 10" )   Wt 111.6 kg (246 lb)  SpO2 93%   BMI 35.30 kg/m   Physical Exam  Constitutional: He is oriented to person, place, and time. He appears well-developed and well-nourished. No distress.  HENT:  Head: Normocephalic and atraumatic.  Mouth/Throat: Oropharynx is clear and moist.  Eyes: Pupils are equal, round, and reactive to light. Conjunctivae and EOM are normal.  Neck: Neck supple.  Cardiovascular: Normal rate, regular rhythm and normal heart sounds.  Pulmonary/Chest: Effort normal and breath sounds normal. No  respiratory distress.  Abdominal: Soft. Bowel sounds are normal. There is tenderness.  Tenderness without guarding right lower quadrant  Musculoskeletal:  No tenderness to palpation to the lumbar back area or the right paraspinous back area.  The patient does have increase in pain with elevation of the right leg.  In the back area.  The right great toe has a callus with a bit of a wound no true ulcer no purulent discharge.  Little bit of redness to all of the great toe.  No paronychial pus.  Cap refill is 2+.  Neurological: He is alert and oriented to person, place, and time. A sensory deficit is present. No cranial nerve deficit. He exhibits normal muscle tone. Coordination normal.  Skin: Skin is warm. No rash noted.  Nursing note and vitals reviewed.    ED Treatments / Results  Labs (all labs ordered are listed, but only abnormal results are displayed) Labs Reviewed  COMPREHENSIVE METABOLIC PANEL - Abnormal; Notable for the following components:      Result Value   BUN 31 (*)    Creatinine, Ser 1.82 (*)    GFR calc non Af Amer 34 (*)    GFR calc Af Amer 39 (*)    All other components within normal limits  CBC WITH DIFFERENTIAL/PLATELET  LIPASE, BLOOD    EKG None  Radiology Ct Abdomen Pelvis W Contrast  Result Date: 04/23/2018 CLINICAL DATA:  77 year old male with history of back surgery presenting with right flank pain. Concern for abdominal infection. EXAM: CT ABDOMEN AND PELVIS WITH CONTRAST TECHNIQUE: Multidetector CT imaging of the abdomen and pelvis was performed using the standard protocol following bolus administration of intravenous contrast. CONTRAST:  90mL ISOVUE-300 IOPAMIDOL (ISOVUE-300) INJECTION 61% COMPARISON:  CT of the abdomen pelvis dated 11/15/2017 and lumbar spine MRI dated 01/01/2018 FINDINGS: Lower chest: The visualized lung bases are clear. There is coronary vascular calcification. No intra-abdominal free air or free fluid. Hepatobiliary: Apparent mild fatty  infiltration of the liver. No intrahepatic biliary ductal dilatation. The gallbladder is unremarkable. Pancreas: Unremarkable. No pancreatic ductal dilatation or surrounding inflammatory changes. Spleen: Normal in size without focal abnormality. Adrenals/Urinary Tract: The adrenal glands are unremarkable. There is no hydronephrosis on either side. There is symmetric enhancement and excretion of contrast by both kidneys. There is a 1.5 cm right renal inferior pole cyst. The visualized ureters and urinary bladder appear unremarkable. Stomach/Bowel: There is sigmoid diverticulosis as well as scattered colonic diverticula without active inflammatory changes. There is no bowel obstruction or active inflammation. The appendix is normal. Vascular/Lymphatic: There is moderate aortoiliac atherosclerotic disease. There is a 3 cm infrarenal aortic aneurysm stable since the prior CT of 11/15/2017. The IVC is unremarkable. No portal venous gas. There is no adenopathy. Reproductive: The prostate and seminal vesicles are grossly unremarkable. Other: None Musculoskeletal: L2-L5 disc spacer and posterior fusion. There is no acute osseous pathology. Degenerative changes of the spine. IMPRESSION: 1. No acute intra-abdominal or pelvic pathology. 2. Colonic diverticulosis. No bowel obstruction or active inflammation. Normal appendix.  3. A 3 cm infrarenal abdominal aortic aneurysm. Recommend followup by ultrasound in 3 years. This recommendation follows ACR consensus guidelines: White Paper of the ACR Incidental Findings Committee II on Vascular Findings. J Am Coll Radiol 2013; 12:751-700 4.  Aortic Atherosclerosis (ICD10-I70.0). Electronically Signed   By: Anner Crete M.D.   On: 04/23/2018 21:39   Dg Toe Great Right  Result Date: 04/23/2018 CLINICAL DATA:  77 year old male with trauma to the right great toe. EXAM: RIGHT GREAT TOE COMPARISON:  None. FINDINGS: There is no acute fracture or dislocation. The bones are osteopenic.  Flowing osteophyte noted in the second-fourth metatarsals which may represent melorheostosis or periosteal thickening related to chronic infection/inflammation. There are degenerative changes and irregularity of the medial aspect of the head of the first metatarsal. The soft tissues appear unremarkable. IMPRESSION: No acute fracture or dislocation. Electronically Signed   By: Anner Crete M.D.   On: 04/23/2018 22:35    Procedures Procedures (including critical care time)  Medications Ordered in ED Medications  0.9 %  sodium chloride infusion (75 mL/hr Intravenous New Bag/Given 04/23/18 2017)  ondansetron (ZOFRAN) injection 4 mg (4 mg Intravenous Given 04/23/18 2017)  HYDROmorphone (DILAUDID) injection 1 mg (1 mg Intravenous Given 04/23/18 2017)  iopamidol (ISOVUE-300) 61 % injection 100 mL (80 mLs Intravenous Contrast Given 04/23/18 2053)  HYDROmorphone (DILAUDID) injection 1 mg (1 mg Intravenous Given 04/23/18 2150)     Initial Impression / Assessment and Plan / ED Course  I have reviewed the triage vital signs and the nursing notes.  Pertinent labs & imaging results that were available during my care of the patient were reviewed by me and considered in my medical decision making (see chart for details).    Patient's work-up without any acute findings.  Did CT abdomen pelvis to rule out any intra-abdominal process.  Also knew that the CT would get a good look at the lumbar spine.  All was negative.  Patient has diverticulosis without diverticulitis.  No bony abnormalities noted on the CT.  Patient's x-ray of his right great toe also had no bony abnormalities.  Patient will need to continue his hydrocodone and close follow-up with neurosurgery.  Patient's pain treated here with hydromorphone a total of 2 mg IV with significant improvement in the pain.   Final Clinical Impressions(s) / ED Diagnoses   Final diagnoses:  Chronic right-sided low back pain without sciatica    ED Discharge  Orders    None       Fredia Sorrow, MD 04/23/18 2304

## 2018-04-23 NOTE — ED Notes (Signed)
Pt given urinal. Pt's wife helping pt to urinate.

## 2018-04-25 ENCOUNTER — Ambulatory Visit (HOSPITAL_BASED_OUTPATIENT_CLINIC_OR_DEPARTMENT_OTHER)
Admission: RE | Admit: 2018-04-25 | Discharge: 2018-04-25 | Disposition: A | Discharge status: Home / Self Care | Payer: Medicare HMO | Source: Ambulatory Visit | Attending: Neurological Surgery | Admitting: Neurological Surgery

## 2018-04-25 DIAGNOSIS — I714 Abdominal aortic aneurysm, without rupture: Secondary | ICD-10-CM | POA: Insufficient documentation

## 2018-04-25 DIAGNOSIS — M5416 Radiculopathy, lumbar region: Secondary | ICD-10-CM | POA: Diagnosis not present

## 2018-04-25 DIAGNOSIS — M4814 Ankylosing hyperostosis [Forestier], thoracic region: Secondary | ICD-10-CM | POA: Insufficient documentation

## 2018-04-25 DIAGNOSIS — M545 Low back pain: Secondary | ICD-10-CM | POA: Diagnosis not present

## 2018-04-26 ENCOUNTER — Ambulatory Visit (INDEPENDENT_AMBULATORY_CARE_PROVIDER_SITE_OTHER): Payer: Medicare HMO | Admitting: Family Medicine

## 2018-04-26 ENCOUNTER — Encounter: Payer: Medicare HMO | Admitting: Rehabilitative and Restorative Service Providers"

## 2018-04-26 VITALS — BP 122/62 | HR 69 | Temp 98.0°F | Resp 18 | Wt 251.1 lb

## 2018-04-26 DIAGNOSIS — I10 Essential (primary) hypertension: Secondary | ICD-10-CM

## 2018-04-26 DIAGNOSIS — M5416 Radiculopathy, lumbar region: Secondary | ICD-10-CM | POA: Diagnosis not present

## 2018-04-26 DIAGNOSIS — N289 Disorder of kidney and ureter, unspecified: Secondary | ICD-10-CM

## 2018-04-26 DIAGNOSIS — R32 Unspecified urinary incontinence: Secondary | ICD-10-CM | POA: Diagnosis not present

## 2018-04-26 NOTE — Progress Notes (Signed)
Subjective:  I acted as a Education administrator for Dr. Charlett Blake. William Hendrix, William Hendrix  Patient ID: William Hendrix, male    DOB: 07-28-1941, 77 y.o.   MRN: [id number]  No chief complaint on file.   HPI  Patient is in today for a 3 month follow up and he is uncomfortable and tearful due to his increased back pain.  He was seen in the ER and CAT scan revealed no acute source of his pain but his pain is severe.  He had tried to drop his hydrocodone to half tab doses but his pain has become overwhelming.  He denies any new falls or trauma.  His pain had been improving status post his lumbar fusion until the week of July 4.  Since that time the pain has been slowly worsening.  He has urinary incontinence but that is ongoing and unchanged.  Does not endorse incontinence of stool.  No fevers or chills. Denies CP/palp/SOB/HA/congestion/fevers/GI c/o. Taking meds as prescribed  Patient Care Team: [name], MD as PCP - General (Family Medicine) [name], MD (Neurosurgery) Lelon Perla, MD as Consulting Physician (Cardiology) Rutherford Guys, MD as Consulting Physician (Ophthalmology) [name], MD as Consulting Physician (Internal Medicine)   Past Medical History:  Diagnosis Date  . AAA (abdominal aortic aneurysm) ([place]) 12/15/2014   a. Mild aneurysmal dilatation of the infrarenal abdominal aorta 3.2 cm - f/u due by 2019.  . Arthritis    "all over" (07/30/2016)  . CAD (coronary artery disease)    a. stent to LAD 2000. b. possible spasm by cath 2001. c. IVUS/PTCA/DES to mLAD 05/2013. d. PTCA/DES of dRCA into ostial rPDA 07/2013. e. PTCA of OM2, DES to Cx, DES to [facility] 07/2016.  Marland Kitchen Chronic bronchitis ([place])   . Chronic diastolic CHF (congestive heart failure) ([place])   . Chronic lower back pain   . Chronic neck pain   . CKD (chronic kidney disease), stage III ([place])   . Diabetic peripheral neuropathy ([place])   . Ejection fraction    55%, 07/2010, mild inferior hypo  . GERD (gastroesophageal reflux  disease)   . Gout   . H/O diverticulitis of colon 01/31/2015  . H/O hiatal hernia   . History of blood transfusion ~ 10/1940   "had pneumonia"  . History of kidney stones   . HTN (hypertension)   . Hyperlipidemia   . Melanoma of forearm, left ([place]) 02/2011   with wide excision   . Myocardial infarction ([place])   . Nephrolithiasis   . Obesity   . OSA on CPAP     [name] since 2000  . Peripheral neuropathy    both feet  . Positive D-dimer    a. significant elevation ,hospital 07/2010, etiology unclear. b. D Dimer chronically > 20.  Marland Kitchen Renal insufficiency   . Skin cancer   . Spinal stenosis    a. s/p surgical repair 2013  . Spinal stenosis of lumbar region   . Type II diabetes mellitus ([place])   . Ventral hernia     Past Surgical History:  Procedure Laterality Date  . ANTERIOR LAT LUMBAR FUSION  10/22/2017   Procedure: Lumbar two-three Lumbar three-four Lumbar four-five Anteriolateral lumbar interbody fusion with percutaneous pedicle screw fixation and infuse;  Surgeon: [name], MD;  Location: [place];  Service: Neurosurgery;;  . APPLICATION OF ROBOTIC ASSISTANCE FOR SPINAL PROCEDURE  10/22/2017   Procedure: APPLICATION OF ROBOTIC ASSISTANCE FOR SPINAL PROCEDURE;  Surgeon: [name], MD;  Location: [place];  Service: Neurosurgery;;  . BACK SURGERY    . CARDIAC CATHETERIZATION N/A 07/30/2016   Procedure: Left Heart Cath and Coronary Angiography;  Surgeon: Nelva Bush, MD;  Location: [place] CV LAB;  Service: Cardiovascular;  Laterality: N/A;  . CARDIAC CATHETERIZATION N/A 07/30/2016   Procedure: Coronary Stent Intervention;  Surgeon: Nelva Bush, MD;  Location: [place] CV LAB;  Service: Cardiovascular;  Laterality: N/A;  Mid CFX and MID RCA  . CARDIAC CATHETERIZATION N/A 07/30/2016   Procedure: Coronary Balloon Angioplasty;  Surgeon: Nelva Bush, MD;  Location: [place] [facility];  Service: Cardiovascular;  Laterality: N/A;  OM 1  . CARPAL TUNNEL RELEASE Left     . CATARACT EXTRACTION W/ INTRAOCULAR LENS  IMPLANT, BILATERAL Bilateral ~ 2014  . CERVICAL DISC SURGERY  1990s  . CORONARY ANGIOPLASTY WITH STENT PLACEMENT  05/2013  . CORONARY ANGIOPLASTY WITH STENT PLACEMENT  07/26/2013   DES to RCA extending to PDA    . CORONARY ANGIOPLASTY WITH STENT PLACEMENT  07/30/2016  . CORONARY ANGIOPLASTY WITH STENT PLACEMENT  2000   CAD  . DENTAL SURGERY  04/2016   "got infected; had to dig it out"  . EYE SURGERY    . KNEE ARTHROSCOPY Right 1990's   rt  . LEFT AND RIGHT HEART CATHETERIZATION WITH CORONARY ANGIOGRAM N/A 07/26/2013   Procedure: LEFT AND RIGHT HEART CATHETERIZATION WITH CORONARY ANGIOGRAM;  Surgeon: [name], MD;  Location: [place] CATH LAB;  Service: Cardiovascular;  Laterality: N/A;  . LEFT HEART CATH AND CORONARY ANGIOGRAPHY N/A 02/26/2017   Procedure: Left Heart Cath and Coronary Angiography;  Surgeon: Sherren Mocha, MD;  Location: [place] CV LAB;  Service: Cardiovascular;  Laterality: N/A;  . LEFT HEART CATHETERIZATION WITH CORONARY ANGIOGRAM N/A 05/19/2013   Procedure: LEFT HEART CATHETERIZATION WITH CORONARY ANGIOGRAM;  Surgeon: [name], MD;  Location: [facility] CATH LAB;  Service: Cardiovascular;  Laterality: N/A;  . LUMBAR LAMINECTOMY/DECOMPRESSION MICRODISCECTOMY  08/30/2012   Procedure: LUMBAR LAMINECTOMY/DECOMPRESSION MICRODISCECTOMY 2 LEVELS;  Surgeon: [name], MD;  Location: [place] NEURO ORS;  Service: Neurosurgery;  Laterality: Bilateral;  Bilateral Lumbar three-four Lumbar four-five Laminotomies  . LUMBAR PERCUTANEOUS PEDICLE SCREW 1 LEVEL Bilateral 10/22/2017   Procedure: LUMBAR PERCUTANEOUS PEDICLE SCREW 1 LEVEL;  Surgeon: [name], MD;  Location: [place];  Service: Neurosurgery;  Laterality: Bilateral;  . LUMBAR SPINE SURGERY  04/07/2016   Dr. Ellene Route; "?ruptured disc"  . MELANOMA EXCISION Left 02/2011   forearm  . NASAL SINUS SURGERY  1970s   "cut windows in sinus pockets"  . PERCUTANEOUS CORONARY STENT  INTERVENTION (PCI-S)  05/19/2013   Procedure: PERCUTANEOUS CORONARY STENT INTERVENTION (PCI-S);  Surgeon: [name], MD;  Location[facility] CATH LAB;  Service: Cardiovascular;;  . ULNAR TUNNEL RELEASE Left 04/07/2016   Dr. Ellene Route    Family History  Problem Relation Age of Onset  . Cancer Mother        intestinal   . Heart disease Mother   . Cancer Father        prostate  . Migraines Daughter   . Leukemia Sister   . Heart disease Sister   . Prostate cancer Unknown   . Kidney cancer Unknown   . Cancer Unknown        Bladder cancer  . Coronary artery disease Unknown   . Hypertension Sister   . Hypertension Brother   . Heart attack Neg Hx   . Stroke Neg Hx     Social History   Socioeconomic History  .  Marital status: Married    Spouse name: Not on file  . Number of children: Not on file  . Years of education: Not on file  . Highest education level: Not on file  Occupational History  . Occupation: [employer]  . Financial resource strain: Not on file  . Food insecurity:    Worry: Not on file    Inability: Not on file  . Transportation needs:    Medical: Not on file    Non-medical: Not on file  Tobacco Use  . Smoking status: Former Smoker    Packs/day: 3.00    Years: 30.00    Pack years: 90.00    Types: Cigarettes    Last attempt to quit: 09/17/1996    Years since quitting: 21.6  . Smokeless tobacco: Never Used  Substance and Sexual Activity  . Alcohol use: No  . Drug use: No  . Sexual activity: Not Currently    Birth control/protection: None  Lifestyle  . Physical activity:    Days per week: Not on file    Minutes per session: Not on file  . Stress: Not on file  Relationships  . Social connections:    Talks on phone: Not on file    Gets together: Not on file    Attends religious service: Not on file    Active member of club or organization: Not on file    Attends meetings of clubs or organizations: Not on file    Relationship status: Not on  file  . Intimate partner violence:    Fear of current or ex partner: Not on file    Emotionally abused: Not on file    Physically abused: Not on file    Forced sexual activity: Not on file  Other Topics Concern  . Not on file  Social History Narrative   Married and lives locally with his wife.  Sharyon Cable.    Outpatient Medications Prior to Visit  Medication Sig Dispense Refill  . acetaminophen (TYLENOL) 500 MG tablet Take 1,000 mg by mouth 2 (two) times daily.    Marland Kitchen albuterol (PROVENTIL HFA;VENTOLIN HFA) 108 (90 Base) MCG/ACT inhaler Inhale 2 puffs into the lungs every 6 (six) hours as needed for wheezing or shortness of breath. 1 Inhaler 0  . allopurinol (ZYLOPRIM) 300 MG tablet Take 1 tablet (300 mg total) by mouth daily. 90 tablet 0  . aspirin EC 81 MG tablet Take 81 mg by mouth daily.    Marland Kitchen atorvastatin (LIPITOR) 40 MG tablet Take 1 tablet (40 mg total) by mouth daily. 90 tablet 1  . B Complex Vitamins (B COMPLEX-B12 PO) Take 1 tablet by mouth at bedtime.    . BD INSULIN SYRINGE ULTRAFINE 31G X 5/16" 1 ML MISC USE  TO INJECT  INSULIN  FIVE  TIMES  DAILY AS INSTRUCTED 450 each 3  . chlorpheniramine (CHLOR-TRIMETON) 4 MG tablet Take 4 mg by mouth daily.    . Cholecalciferol (VITAMIN D) 1000 UNITS capsule Take 1,000 Units by mouth at bedtime.     . fenofibrate 160 MG tablet Take 1 tablet (160 mg total) by mouth at bedtime. 90 tablet 1  . fluocinonide cream (LIDEX) 9.21 % Apply 1 application topically daily as needed (for rash).     . fluticasone (FLONASE) 50 MCG/ACT nasal spray Place 2 sprays into both nostrils daily. 16 g 1  . furosemide (LASIX) 40 MG tablet TAKE 1 AND 1/2 TABLETS EVERY DAY (Patient taking differently: Take 60 mg by  mouth every day) 135 tablet 2  . HYDROcodone-acetaminophen (NORCO/VICODIN) 5-325 MG tablet Take 1-2 tablets by mouth every 4 (four) hours as needed for severe pain ((score 7 to 10)). 60 tablet 0  . insulin NPH Human (NOVOLIN N) 100 UNIT/ML injection Inject 45  units in am and 55 in the evening (Patient taking differently: Inject 45-50 Units into the skin See admin instructions. Inject 45 units SQ in the morning and inject 50 units SQ at bedtime) 30 mL 3  . insulin regular (NOVOLIN R RELION) 100 units/mL injection Inject 0.25-0.3 mLs (25-30 Units total) into the skin 3 (three) times daily before meals. (Patient taking differently: Inject 35 Units into the skin 3 (three) times daily before meals. ) 70 mL 3  . isosorbide mononitrate (IMDUR) 30 MG 24 hr tablet Take 1 tablet (30 mg total) by mouth daily. 90 tablet 3  . metoprolol tartrate (LOPRESSOR) 50 MG tablet Take 1 tablet (50 mg total) by mouth 2 (two) times daily. 180 tablet 1  . mupirocin ointment (BACTROBAN) 2 % Apply to area thin film twice daily 22 g 0  . nitroGLYCERIN (NITROSTAT) 0.4 MG SL tablet Place 1 tablet (0.4 mg total) under the tongue every 5 (five) minutes as needed for chest pain. 100 tablet 3  . pantoprazole (PROTONIX) 20 MG tablet TAKE 1 TABLET (20 MG TOTAL) BY MOUTH DAILY. 90 tablet 3  . Probiotic Product (PROBIOTIC PO) Take 1 capsule by mouth daily.    . tamsulosin (FLOMAX) 0.4 MG CAPS capsule Take 1 capsule (0.4 mg total) by mouth daily. 90 capsule 1  . tiZANidine (ZANAFLEX) 4 MG capsule Take 1 capsule (4 mg total) by mouth 4 (four) times daily as needed for muscle spasms. Take 1 tablet every 6 hours as needed; not to exceed 3 doses in 24 hours. 40 capsule 2  . TRUE METRIX BLOOD GLUCOSE TEST test strip USE TO TEST BLOOD SUGAR THREE TIMES DAILY AS DIRECTED 300 each 3   No facility-administered medications prior to visit.     Allergies  Allergen Reactions  . Levemir [Insulin Detemir] Swelling and Other (See Comments)    Patient had redness and swelling and tenderness at injection site.  . Codeine Other (See Comments)    Makes him "shakey", is OK with hydrocodone GI UPSET & TREMORS  . Lipitor [Atorvastatin] Other (See Comments)    Muscle aches; liver functions. Patient is currently  taken  . Novolog Mix [Insulin Aspart Prot & Aspart] Other (See Comments)    Causes skin to swell/itch at injection site  . Other Other (See Comments)    Brilinta caused SOB    Review of Systems  Constitutional: Positive for malaise/fatigue. Negative for fever.  HENT: Negative for congestion.   Eyes: Negative for blurred vision.  Respiratory: Negative for shortness of breath.   Cardiovascular: Negative for chest pain, palpitations and leg swelling.  Gastrointestinal: Negative for abdominal pain, blood in stool and nausea.  Genitourinary: Positive for frequency and urgency. Negative for dysuria, flank pain and hematuria.  Musculoskeletal: Positive for back pain. Negative for falls.  Skin: Negative for rash.  Neurological: Negative for dizziness, loss of consciousness and headaches.  Endo/Heme/Allergies: Negative for environmental allergies.  Psychiatric/Behavioral: Positive for depression. The patient is nervous/anxious.        Objective:    Physical Exam  Constitutional: He is oriented to person, place, and time. He appears well-developed and well-nourished. No distress.  HENT:  Head: Normocephalic and atraumatic.  Nose: Nose normal.  Eyes:  Right eye exhibits no discharge. Left eye exhibits no discharge.  Neck: Normal range of motion. Neck supple.  Cardiovascular: Normal rate and regular rhythm.  No murmur heard. Pulmonary/Chest: Effort normal and breath sounds normal.  Abdominal: Soft. Bowel sounds are normal. There is no tenderness.  Musculoskeletal: He exhibits no edema.  Neurological: He is alert and oriented to person, place, and time.  Skin: Skin is warm and dry.  Psychiatric: He has a normal mood and affect.  Nursing note and vitals reviewed.   BP 122/62 (BP Location: Left Arm, Patient Position: Sitting, Cuff Size: Large)   Pulse 69   Temp 98 F (36.7 C) (Oral)   Resp 18   Wt 251 lb 1.6 oz (113.9 kg)   SpO2 96%   BMI 36.03 kg/m  Wt Readings from Last 3  Encounters:  04/26/18 251 lb 1.6 oz (113.9 kg)  04/23/18 246 lb (111.6 kg)  01/24/18 246 lb 6.4 oz (111.8 kg)   BP Readings from Last 3 Encounters:  04/26/18 122/62  04/23/18 135/62  01/24/18 120/62     Immunization History  Administered Date(s) Administered  . Influenza Split 08/08/2012  . Influenza Whole 08/08/2009, 07/12/2010  . Influenza, High Dose Seasonal PF 08/02/2014, 07/11/2015, 08/26/2017  . Influenza,inj,Quad PF,6+ Mos 07/27/2013, 08/04/2016  . Pneumococcal Conjugate-13 08/02/2014  . Pneumococcal Polysaccharide-23 07/28/2005    Health Maintenance  Topic Date Due  . TETANUS/TDAP  03/13/1959  . URINE MICROALBUMIN  08/03/2015  . FOOT EXAM  05/21/2016  . OPHTHALMOLOGY EXAM  09/29/2016  . INFLUENZA VACCINE  05/12/2018  . HEMOGLOBIN A1C  07/09/2018  . PNA vac Low Risk Adult  Completed    Lab Results  Component Value Date   WBC 8.3 04/23/2018   HGB 14.4 04/23/2018   HCT 40.8 04/23/2018   PLT 216 04/23/2018   GLUCOSE 94 04/26/2018   CHOL 135 01/24/2018   TRIG 286.0 (H) 01/24/2018   HDL 25.20 (L) 01/24/2018   LDLDIRECT 69.0 01/24/2018   LDLCALC 31 04/09/2014   ALT 17 04/26/2018   AST 18 04/26/2018   NA 142 04/26/2018   K 4.3 04/26/2018   CL 105 04/26/2018   CREATININE 1.56 (H) 04/26/2018   BUN 29 (H) 04/26/2018   CO2 31 04/26/2018   TSH 3.39 01/24/2018   INR 1.1 02/24/2017   HGBA1C 5.7 01/06/2018   MICROALBUR 0.7 08/02/2014    Lab Results  Component Value Date   TSH 3.39 01/24/2018   Lab Results  Component Value Date   WBC 8.3 04/23/2018   HGB 14.4 04/23/2018   HCT 40.8 04/23/2018   MCV 91.5 04/23/2018   PLT 216 04/23/2018   Lab Results  Component Value Date   NA 142 04/26/2018   K 4.3 04/26/2018   CO2 31 04/26/2018   GLUCOSE 94 04/26/2018   BUN 29 (H) 04/26/2018   CREATININE 1.56 (H) 04/26/2018   BILITOT 0.6 04/26/2018   ALKPHOS 48 04/26/2018   AST 18 04/26/2018   ALT 17 04/26/2018   PROT 6.9 04/26/2018   ALBUMIN 4.2 04/26/2018    CALCIUM 10.1 04/26/2018   ANIONGAP 11 04/23/2018   GFR 45.96 (L) 04/26/2018   Lab Results  Component Value Date   CHOL 135 01/24/2018   Lab Results  Component Value Date   HDL 25.20 (L) 01/24/2018   Lab Results  Component Value Date   LDLCALC 31 04/09/2014   Lab Results  Component Value Date   TRIG 286.0 (H) 01/24/2018   Lab Results  Component  Value Date   CHOLHDL 5 01/24/2018   Lab Results  Component Value Date   HGBA1C 5.7 01/06/2018         Assessment & Plan:   Problem List Items Addressed This Visit    Renal insufficiency - Primary    stressed need to hydrate well daily and will continue to monitor.       Relevant Orders   Comprehensive metabolic panel (Completed)   Essential hypertension    Well controlled, no changes to meds. Encouraged heart healthy diet such as the DASH diet and exercise as tolerated.       Lumbar radiculopathy    Unde went lumbar fusion in past few months and his pain was improving until the week of July 4 when it began to worsen again. He was seen in ER and CT showed no acute concerns but he is in significant pain. He has been only taking 1/2 tabs of hydrocodone. He is instructed to increase to a full tab once more. He has an appointment with his neuosugeon tomorrow.       Urinary incontinence    Longstanding and unchanged         I am having William Aquas. Lorain Childes "William Hendrix" maintain his Vitamin D, fluocinonide cream, aspirin EC, acetaminophen, B Complex Vitamins (B COMPLEX-B12 PO), albuterol, tiZANidine, BD INSULIN SYRINGE ULTRAFINE, TRUE METRIX BLOOD GLUCOSE TEST, furosemide, fluticasone, insulin regular, insulin NPH Human, mupirocin ointment, chlorpheniramine, Probiotic Product (PROBIOTIC PO), HYDROcodone-acetaminophen, pantoprazole, isosorbide mononitrate, tamsulosin, metoprolol tartrate, atorvastatin, fenofibrate, nitroGLYCERIN, and allopurinol.  No orders of the defined types were placed in this encounter.   CMA served as Education administrator  during this visit. History, Physical and Plan performed by medical provider. Documentation and orders reviewed and attested to.  Penni Homans, MD

## 2018-04-26 NOTE — Progress Notes (Signed)
Subjective:    Patient ID: William Hendrix, male    DOB: 1941-08-26, 77 y.o.   MRN: [id number]  No chief complaint on file.   HPI Patient is in today for a 3 month follow up. He has been experiencing severe lower back pain related to lumbar fusion surgery in Jan. 2019. Reports that the back pain had been improving since he started rehab a few months ago. He took a week off of rehab to go on vacation with his family the week of the fourth of July, and the pain began to get worse, and has continued to get worse since then. He reports that some days he cannot get out of bed. He has been taking 1/2 of a Norco every 6 hrs, although he was prescribed 1 tablet every 4 hrs PRN. He presented to the ED 7/13 due to severe back pain, and was discharged after a CT did not show any significant findings. Patient is scheduled to see his neurosurgeon on 7/17. He denies any nausea, vomiting, shortness of breath, or heart palpitations.  Patient also reports consistent urinary incontinence that he attributes to his Lasix dose. He thinks that this problem was going on before his back surgery. He denies any dysuria or blood in the urine.   Patient also has small ulceration on the right big toe. He reports that it has been present for ~2 weeks. He thinks that it has been getting better and not worse. He had an Xray taken in the ED on 7/16 that showed no fractures or osteomyelitis. Denies any fever or chills.   Past Medical History:  Diagnosis Date  . AAA (abdominal aortic aneurysm) ([place]) 12/15/2014   a. Mild aneurysmal dilatation of the infrarenal abdominal aorta 3.2 cm - f/u due by 2019.  . Arthritis    "all over" (07/30/2016)  . CAD (coronary artery disease)    a. stent to LAD 2000. b. possible spasm by cath 2001. c. IVUS/PTCA/DES to mLAD 05/2013. d. PTCA/DES of dRCA into ostial rPDA 07/2013. e. PTCA of OM2, DES to Cx, DES to [facility] 07/2016.  Marland Kitchen Chronic bronchitis ([place])   . Chronic diastolic CHF (congestive heart  failure) ([place])   . Chronic lower back pain   . Chronic neck pain   . CKD (chronic kidney disease), stage III ([place])   . Diabetic peripheral neuropathy ([place])   . Ejection fraction    55%, 07/2010, mild inferior hypo  . GERD (gastroesophageal reflux disease)   . Gout   . H/O diverticulitis of colon 01/31/2015  . H/O hiatal hernia   . History of blood transfusion ~ 10/1940   "had pneumonia"  . History of kidney stones   . HTN (hypertension)   . Hyperlipidemia   . Melanoma of forearm, left ([place]) 02/2011   with wide excision   . Myocardial infarction ([place])   . Nephrolithiasis   . Obesity   . OSA on CPAP     [name] since 2000  . Peripheral neuropathy    both feet  . Positive D-dimer    a. significant elevation ,hospital 07/2010, etiology unclear. b. D Dimer chronically > 20.  Marland Kitchen Renal insufficiency   . Skin cancer   . Spinal stenosis    a. s/p surgical repair 2013  . Spinal stenosis of lumbar region   . Type II diabetes mellitus ([place])   . Ventral hernia     Past Surgical History:  Procedure Laterality Date  . ANTERIOR LAT LUMBAR FUSION  10/22/2017   Procedure: Lumbar two-three Lumbar three-four Lumbar four-five Anteriolateral lumbar interbody fusion with percutaneous pedicle screw fixation and infuse;  Surgeon: [name], MD;  Location: [address];  Service: Neurosurgery;;  . APPLICATION OF ROBOTIC ASSISTANCE FOR SPINAL PROCEDURE  10/22/2017   Procedure: APPLICATION OF ROBOTIC ASSISTANCE FOR SPINAL PROCEDURE;  Surgeon: [name], MD;  Location: [place];  Service: Neurosurgery;;  . BACK SURGERY    . CARDIAC CATHETERIZATION N/A 07/30/2016   Procedure: Left Heart Cath and Coronary Angiography;  Surgeon: Nelva Bush, MD;  Location: [place] CV LAB;  Service: Cardiovascular;  Laterality: N/A;  . CARDIAC CATHETERIZATION N/A 07/30/2016   Procedure: Coronary Stent Intervention;  Surgeon: Nelva Bush, MD;  Location: [place] CV LAB;  Service: Cardiovascular;  Laterality: N/A;   Mid CFX and MID RCA  . CARDIAC CATHETERIZATION N/A 07/30/2016   Procedure: Coronary Balloon Angioplasty;  Surgeon: Nelva Bush, MD;  Location: [place] CV LAB;  Service: Cardiovascular;  Laterality: N/A;  OM 1  . CARPAL TUNNEL RELEASE Left   . CATARACT EXTRACTION W/ INTRAOCULAR LENS  IMPLANT, BILATERAL Bilateral ~ 2014  . CERVICAL DISC SURGERY  1990s  . CORONARY ANGIOPLASTY WITH STENT PLACEMENT  05/2013  . CORONARY ANGIOPLASTY WITH STENT PLACEMENT  07/26/2013   DES to RCA extending to PDA    . CORONARY ANGIOPLASTY WITH STENT PLACEMENT  07/30/2016  . CORONARY ANGIOPLASTY WITH STENT PLACEMENT  2000   CAD  . DENTAL SURGERY  04/2016   "got infected; had to dig it out"  . EYE SURGERY    . KNEE ARTHROSCOPY Right 1990's   rt  . LEFT AND RIGHT HEART CATHETERIZATION WITH CORONARY ANGIOGRAM N/A 07/26/2013   Procedure: LEFT AND RIGHT HEART CATHETERIZATION WITH CORONARY ANGIOGRAM;  Surgeon: [name], MD;  Location: [place] CATH LAB;  Service: Cardiovascular;  Laterality: N/A;  . LEFT HEART CATH AND CORONARY ANGIOGRAPHY N/A 02/26/2017   Procedure: Left Heart Cath and Coronary Angiography;  Surgeon: Sherren Mocha, MD;  Location: [place] CV LAB;  Service: Cardiovascular;  Laterality: N/A;  . LEFT HEART CATHETERIZATION WITH CORONARY ANGIOGRAM N/A 05/19/2013   Procedure: LEFT HEART CATHETERIZATION WITH CORONARY ANGIOGRAM;  Surgeon: [name], MD;  Location: [facility] CATH LAB;  Service: Cardiovascular;  Laterality: N/A;  . LUMBAR LAMINECTOMY/DECOMPRESSION MICRODISCECTOMY  08/30/2012   Procedure: LUMBAR LAMINECTOMY/DECOMPRESSION MICRODISCECTOMY 2 LEVELS;  Surgeon: [name], MD;  Location: [facility] NEURO [facility]S;  Service: Neurosurgery;  Laterality: Bilateral;  Bilateral Lumbar three-four Lumbar four-five Laminotomies  . LUMBAR PERCUTANEOUS PEDICLE SCREW 1 LEVEL Bilateral 10/22/2017   Procedure: LUMBAR PERCUTANEOUS PEDICLE SCREW 1 LEVEL;  Surgeon: [name], MD;  Location: [address];  Service:  Neurosurgery;  Laterality: Bilateral;  . LUMBAR SPINE SURGERY  04/07/2016   Dr. Ellene Route; "?ruptured disc"  . MELANOMA EXCISION Left 02/2011   forearm  . NASAL SINUS SURGERY  1970s   "cut windows in sinus pockets"  . PERCUTANEOUS CORONARY STENT INTERVENTION (PCI-S)  05/19/2013   Procedure: PERCUTANEOUS CORONARY STENT INTERVENTION (PCI-S);  Surgeon: [name], MD;  Location: [facility] CATH LAB;  Service: Cardiovascular;;  . ULNAR TUNNEL RELEASE Left 04/07/2016   Dr. Ellene Route    Family History  Problem Relation Age of Onset  . Cancer Mother        intestinal   . Heart disease Mother   . Cancer Father        prostate  . Migraines Daughter   . Leukemia Sister   . Heart disease Sister   .  Prostate cancer Unknown   . Kidney cancer Unknown   . Cancer Unknown        Bladder cancer  . Coronary artery disease Unknown   . Hypertension Sister   . Hypertension Brother   . Heart attack Neg Hx   . Stroke Neg Hx     Social History   Socioeconomic History  . Marital status: Married    Spouse name: Not on file  . Number of children: Not on file  . Years of education: Not on file  . Highest education level: Not on file  Occupational History  . Occupation: [employer]  . Financial resource strain: Not on file  . Food insecurity:    Worry: Not on file    Inability: Not on file  . Transportation needs:    Medical: Not on file    Non-medical: Not on file  Tobacco Use  . Smoking status: Former Smoker    Packs/day: 3.00    Years: 30.00    Pack years: 90.00    Types: Cigarettes    Last attempt to quit: 09/17/1996    Years since quitting: 21.6  . Smokeless tobacco: Never Used  Substance and Sexual Activity  . Alcohol use: No  . Drug use: No  . Sexual activity: Not Currently    Birth control/protection: None  Lifestyle  . Physical activity:    Days per week: Not on file    Minutes per session: Not on file  . Stress: Not on file  Relationships  . Social connections:     Talks on phone: Not on file    Gets together: Not on file    Attends religious service: Not on file    Active member of club or organization: Not on file    Attends meetings of clubs or organizations: Not on file    Relationship status: Not on file  . Intimate partner violence:    Fear of current or ex partner: Not on file    Emotionally abused: Not on file    Physically abused: Not on file    Forced sexual activity: Not on file  Other Topics Concern  . Not on file  Social History Narrative   Married and lives locally with his wife.  Sharyon Cable.    Outpatient Medications Prior to Visit  Medication Sig Dispense Refill  . acetaminophen (TYLENOL) 500 MG tablet Take 1,000 mg by mouth 2 (two) times daily.    Marland Kitchen albuterol (PROVENTIL HFA;VENTOLIN HFA) 108 (90 Base) MCG/ACT inhaler Inhale 2 puffs into the lungs every 6 (six) hours as needed for wheezing or shortness of breath. 1 Inhaler 0  . allopurinol (ZYLOPRIM) 300 MG tablet Take 1 tablet (300 mg total) by mouth daily. 90 tablet 0  . aspirin EC 81 MG tablet Take 81 mg by mouth daily.    Marland Kitchen atorvastatin (LIPITOR) 40 MG tablet Take 1 tablet (40 mg total) by mouth daily. 90 tablet 1  . B Complex Vitamins (B COMPLEX-B12 PO) Take 1 tablet by mouth at bedtime.    . BD INSULIN SYRINGE ULTRAFINE 31G X 5/16" 1 ML MISC USE  TO INJECT  INSULIN  FIVE  TIMES  DAILY AS INSTRUCTED 450 each 3  . chlorpheniramine (CHLOR-TRIMETON) 4 MG tablet Take 4 mg by mouth daily.    . Cholecalciferol (VITAMIN D) 1000 UNITS capsule Take 1,000 Units by mouth at bedtime.     . fenofibrate 160 MG tablet Take 1 tablet (160 mg total) by  mouth at bedtime. 90 tablet 1  . fluocinonide cream (LIDEX) 6.29 % Apply 1 application topically daily as needed (for rash).     . fluticasone (FLONASE) 50 MCG/ACT nasal spray Place 2 sprays into both nostrils daily. 16 g 1  . furosemide (LASIX) 40 MG tablet TAKE 1 AND 1/2 TABLETS EVERY DAY (Patient taking differently: Take 60 mg by mouth every  day) 135 tablet 2  . HYDROcodone-acetaminophen (NORCO/VICODIN) 5-325 MG tablet Take 1-2 tablets by mouth every 4 (four) hours as needed for severe pain ((score 7 to 10)). 60 tablet 0  . insulin NPH Human (NOVOLIN N) 100 UNIT/ML injection Inject 45 units in am and 55 in the evening (Patient taking differently: Inject 45-50 Units into the skin See admin instructions. Inject 45 units SQ in the morning and inject 50 units SQ at bedtime) 30 mL 3  . insulin regular (NOVOLIN R RELION) 100 units/mL injection Inject 0.25-0.3 mLs (25-30 Units total) into the skin 3 (three) times daily before meals. (Patient taking differently: Inject 35 Units into the skin 3 (three) times daily before meals. ) 70 mL 3  . isosorbide mononitrate (IMDUR) 30 MG 24 hr tablet Take 1 tablet (30 mg total) by mouth daily. 90 tablet 3  . metoprolol tartrate (LOPRESSOR) 50 MG tablet Take 1 tablet (50 mg total) by mouth 2 (two) times daily. 180 tablet 1  . mupirocin ointment (BACTROBAN) 2 % Apply to area thin film twice daily 22 g 0  . nitroGLYCERIN (NITROSTAT) 0.4 MG SL tablet Place 1 tablet (0.4 mg total) under the tongue every 5 (five) minutes as needed for chest pain. 100 tablet 3  . pantoprazole (PROTONIX) 20 MG tablet TAKE 1 TABLET (20 MG TOTAL) BY MOUTH DAILY. 90 tablet 3  . Probiotic Product (PROBIOTIC PO) Take 1 capsule by mouth daily.    . tamsulosin (FLOMAX) 0.4 MG CAPS capsule Take 1 capsule (0.4 mg total) by mouth daily. 90 capsule 1  . tiZANidine (ZANAFLEX) 4 MG capsule Take 1 capsule (4 mg total) by mouth 4 (four) times daily as needed for muscle spasms. Take 1 tablet every 6 hours as needed; not to exceed 3 doses in 24 hours. 40 capsule 2  . TRUE METRIX BLOOD GLUCOSE TEST test strip USE TO TEST BLOOD SUGAR THREE TIMES DAILY AS DIRECTED 300 each 3   No facility-administered medications prior to visit.     Allergies  Allergen Reactions  . Levemir [Insulin Detemir] Swelling and Other (See Comments)    Patient had redness  and swelling and tenderness at injection site.  . Codeine Other (See Comments)    Makes him "shakey", is OK with hydrocodone GI UPSET & TREMORS  . Lipitor [Atorvastatin] Other (See Comments)    Muscle aches; liver functions. Patient is currently taken  . Novolog Mix [Insulin Aspart Prot & Aspart] Other (See Comments)    Causes skin to swell/itch at injection site  . Other Other (See Comments)    Brilinta caused SOB    ROS Constitutional: Negative for malaise/fatigue. Negative for fever.  HENT: Negative for congestion.   Eyes: Negative for blurred vision.  Respiratory: Negative for cough and shortness of breath.   Cardiovascular: Negative for chest pain, palpitations and leg swelling.  Gastrointestinal: Negative for nausea/vomiting/constipation/diarrhea.  Genitourinary: Positive for urinary incontinence. Negative for dysuria, hematuria.  Musculoskeletal: Positive for back pain.  Skin: Negative for rash.  Neurological: Negative for loss of consciousness and headaches.     Objective:    Physical  Exam Constitutional: He appears well-developed and well-nourished. No distress.  HENT:  Head: Normocephalic and atraumatic.  Eyes: Conjunctivae are normal.  Neck: Normal range of motion. No thyromegaly present.  Cardiovascular: Normal rate and regular rhythm.  Pulmonary/Chest: Effort normal and breath sounds normal. He has no wheezes.  Abdominal: Soft. Bowel sounds are normal. There is no tenderness.  Musculoskeletal: Normal range of motion. He exhibits no edema or deformity.  Neurological: He is alert and oriented to person, place, and time.  Skin: Skin is warm and dry. He is not diaphoretic. Superficial, healing ulcer on right large toe.  Psychiatric: He has a normal mood and affect.   BP 122/62 (BP Location: Left Arm, Patient Position: Sitting, Cuff Size: Large)   Pulse 69   Temp 98 F (36.7 C) (Oral)   Resp 18   Wt 251 lb 1.6 oz (113.9 kg)   SpO2 96%   BMI 36.03 kg/m  Wt  Readings from Last 3 Encounters:  04/26/18 251 lb 1.6 oz (113.9 kg)  04/23/18 246 lb (111.6 kg)  01/24/18 246 lb 6.4 oz (111.8 kg)     Lab Results  Component Value Date   WBC 8.3 04/23/2018   HGB 14.4 04/23/2018   HCT 40.8 04/23/2018   PLT 216 04/23/2018   GLUCOSE 84 04/23/2018   CHOL 135 01/24/2018   TRIG 286.0 (H) 01/24/2018   HDL 25.20 (L) 01/24/2018   LDLDIRECT 69.0 01/24/2018   LDLCALC 31 04/09/2014   ALT 22 04/23/2018   AST 29 04/23/2018   NA 141 04/23/2018   K 3.6 04/23/2018   CL 105 04/23/2018   CREATININE 1.82 (H) 04/23/2018   BUN 31 (H) 04/23/2018   CO2 25 04/23/2018   TSH 3.39 01/24/2018   INR 1.1 02/24/2017   HGBA1C 5.7 01/06/2018   MICROALBUR 0.7 08/02/2014    Lab Results  Component Value Date   TSH 3.39 01/24/2018   Lab Results  Component Value Date   WBC 8.3 04/23/2018   HGB 14.4 04/23/2018   HCT 40.8 04/23/2018   MCV 91.5 04/23/2018   PLT 216 04/23/2018   Lab Results  Component Value Date   NA 141 04/23/2018   K 3.6 04/23/2018   CO2 25 04/23/2018   GLUCOSE 84 04/23/2018   BUN 31 (H) 04/23/2018   CREATININE 1.82 (H) 04/23/2018   BILITOT 0.5 04/23/2018   ALKPHOS 56 04/23/2018   AST 29 04/23/2018   ALT 22 04/23/2018   PROT 7.8 04/23/2018   ALBUMIN 4.4 04/23/2018   CALCIUM 10.0 04/23/2018   ANIONGAP 11 04/23/2018   GFR 45.99 (L) 01/24/2018   Lab Results  Component Value Date   CHOL 135 01/24/2018   Lab Results  Component Value Date   HDL 25.20 (L) 01/24/2018   Lab Results  Component Value Date   LDLCALC 31 04/09/2014   Lab Results  Component Value Date   TRIG 286.0 (H) 01/24/2018   Lab Results  Component Value Date   CHOLHDL 5 01/24/2018   Lab Results  Component Value Date   HGBA1C 5.7 01/06/2018       Assessment & Plan:   Problem List Items Addressed This Visit      Genitourinary   Renal insufficiency - Primary   Relevant Orders   Comprehensive metabolic panel     Back Pain Mr. Launer had CT Lumbar on  04/25/18 that showed moderately severe appearing foraminal stenosis at L5-S1. He will follow with his neurosurgeon Dr. Roselee Culver on 7/18. Instructed patient to  increase his dose of Hydrocodone back to 1 tablet every 4 hrs PRN to manage his severe back pain until his Thursday neurosurgery appointment.   Urinary Incontinence Patient has to wear a pad, and routinely wets his clothing several times a day, due to urinary urgency and limited mobility related to his back pain. Most recent CMP showed Cr of 1.82, which is increased from his baseline. Instructed him to alternate between 40 and 60 mg of Lasix from day to day. Also stressed the importance of taking a daily weight, and to return to 60 mg of Lasix every day if his weight increases.   Toe Wound Small wound approx 1 cm in diameter on the right great toe. Patient believes that the wound has been getting better and not worse. Patient was instructed to continue to monitor it, and if it gets worse, or he experiences fever/chills, to call for follow up. Stressed the importance of checking feet and keeping them clean/dry.   I am having Elyn Aquas. Lorain Childes "[name]" maintain his Vitamin D, fluocinonide cream, aspirin EC, acetaminophen, B Complex Vitamins (B COMPLEX-B12 PO), albuterol, tiZANidine, BD INSULIN SYRINGE ULTRAFINE, TRUE METRIX BLOOD GLUCOSE TEST, furosemide, fluticasone, insulin regular, insulin NPH Human, mupirocin ointment, chlorpheniramine, Probiotic Product (PROBIOTIC PO), HYDROcodone-acetaminophen, pantoprazole, isosorbide mononitrate, tamsulosin, metoprolol tartrate, atorvastatin, fenofibrate, nitroGLYCERIN, and allopurinol.  No orders of the defined types were placed in this encounter.    Verdis Frederickson, Medical Student  Patient seen with and examined with student.  Agree with documentation See separate note for further documentation

## 2018-04-26 NOTE — Patient Instructions (Signed)

## 2018-04-27 LAB — COMPREHENSIVE METABOLIC PANEL
ALBUMIN: 4.2 g/dL (ref 3.5–5.2)
ALK PHOS: 48 U/L (ref 39–117)
ALT: 17 U/L (ref 0–53)
AST: 18 U/L (ref 0–37)
BUN: 29 mg/dL — AB (ref 6–23)
CHLORIDE: 105 meq/L (ref 96–112)
CO2: 31 mEq/L (ref 19–32)
CREATININE: 1.56 mg/dL — AB (ref 0.40–1.50)
Calcium: 10.1 mg/dL (ref 8.4–10.5)
GFR: 45.96 mL/min — ABNORMAL LOW (ref >60.00)
GLUCOSE: 94 mg/dL (ref 70–99)
Potassium: 4.3 mEq/L (ref 3.5–5.1)
SODIUM: 142 meq/L (ref 135–145)
TOTAL PROTEIN: 6.9 g/dL (ref 6.0–8.3)
Total Bilirubin: 0.6 mg/dL (ref 0.2–1.2)

## 2018-04-28 ENCOUNTER — Encounter: Payer: Medicare HMO | Admitting: Rehabilitative and Restorative Service Providers"

## 2018-04-28 DIAGNOSIS — M47816 Spondylosis without myelopathy or radiculopathy, lumbar region: Secondary | ICD-10-CM | POA: Diagnosis not present

## 2018-04-28 DIAGNOSIS — Z6834 Body mass index (BMI) 34.0-34.9, adult: Secondary | ICD-10-CM | POA: Diagnosis not present

## 2018-04-28 DIAGNOSIS — I10 Essential (primary) hypertension: Secondary | ICD-10-CM | POA: Diagnosis not present

## 2018-05-01 DIAGNOSIS — R32 Unspecified urinary incontinence: Secondary | ICD-10-CM | POA: Insufficient documentation

## 2018-05-01 NOTE — Assessment & Plan Note (Signed)
stressed need to hydrate well daily and will continue to monitor.

## 2018-05-01 NOTE — Assessment & Plan Note (Signed)
Well controlled, no changes to meds. Encouraged heart healthy diet such as the DASH diet and exercise as tolerated.  °

## 2018-05-01 NOTE — Assessment & Plan Note (Signed)
Longstanding and unchanged

## 2018-05-01 NOTE — Assessment & Plan Note (Signed)
Unde went lumbar fusion in past few months and his pain was improving until the week of July 4 when it began to worsen again. He was seen in ER and CT showed no acute concerns but he is in significant pain. He has been only taking 1/2 tabs of hydrocodone. He is instructed to increase to a full tab once more. He has an appointment with his neuosugeon tomorrow.

## 2018-05-02 DIAGNOSIS — M47816 Spondylosis without myelopathy or radiculopathy, lumbar region: Secondary | ICD-10-CM | POA: Diagnosis not present

## 2018-05-02 DIAGNOSIS — M5441 Lumbago with sciatica, right side: Secondary | ICD-10-CM | POA: Diagnosis not present

## 2018-05-02 DIAGNOSIS — M48061 Spinal stenosis, lumbar region without neurogenic claudication: Secondary | ICD-10-CM | POA: Diagnosis not present

## 2018-05-02 DIAGNOSIS — M1288 Other specific arthropathies, not elsewhere classified, other specified site: Secondary | ICD-10-CM | POA: Diagnosis not present

## 2018-05-02 DIAGNOSIS — Z981 Arthrodesis status: Secondary | ICD-10-CM | POA: Diagnosis not present

## 2018-05-09 ENCOUNTER — Encounter: Payer: Self-pay | Admitting: Internal Medicine

## 2018-05-09 ENCOUNTER — Ambulatory Visit: Payer: Medicare HMO | Admitting: Internal Medicine

## 2018-05-09 VITALS — BP 122/62 | HR 82 | Ht 70.0 in | Wt 248.2 lb

## 2018-05-09 DIAGNOSIS — Z794 Long term (current) use of insulin: Secondary | ICD-10-CM

## 2018-05-09 DIAGNOSIS — E785 Hyperlipidemia, unspecified: Secondary | ICD-10-CM | POA: Diagnosis not present

## 2018-05-09 DIAGNOSIS — Z6837 Body mass index (BMI) 37.0-37.9, adult: Secondary | ICD-10-CM

## 2018-05-09 DIAGNOSIS — E1165 Type 2 diabetes mellitus with hyperglycemia: Secondary | ICD-10-CM

## 2018-05-09 DIAGNOSIS — E1159 Type 2 diabetes mellitus with other circulatory complications: Secondary | ICD-10-CM

## 2018-05-09 DIAGNOSIS — IMO0002 Reserved for concepts with insufficient information to code with codable children: Secondary | ICD-10-CM

## 2018-05-09 MED ORDER — INSULIN NPH (HUMAN) (ISOPHANE) 100 UNIT/ML ~~LOC~~ SUSP: SUBCUTANEOUS | 3 refills | Status: DC

## 2018-05-09 NOTE — Patient Instructions (Addendum)
Please change:  Insulin Before breakfast Before lunch Before dinner  Regular 35 25-30 25-30 (if sugars <100, take 15 units)  NPH 45  40   Please return in 4 months with your sugar log.

## 2018-05-09 NOTE — Progress Notes (Signed)
Patient ID: William Hendrix, male   DOB: 1940-10-27, 77 y.o.   MRN: [id number]  HPI: William Hendrix is a 77 y.o.-year-old male, returning for f/u for DM2, dx 2009, insulin-dependent, uncontrolled, with complications (CAD s/p AMI 2017, CKD stage 3). Last visit 4 months ago.  He has increased back pain since last visit >> 2 steroid inj's recently. Sugars higher.  At the end of 2018, he eliminated meat almost completely and his sugars improved significantly, while he also lost weight.  He continues to eat a mostly plant-based diet.  Last hemoglobin A1c was: Lab Results  Component Value Date   HGBA1C 5.7 01/06/2018   HGBA1C 6.3 09/09/2017   HGBA1C 7.3 06/09/2017   He is on: ReliOn insulin:  Insulin Before breakfast Before lunch Before dinner  Regular 35 30-35 35 (if sugars <100, take 15 units)  NPH 45  50   We cannot use Metformin 2/2 CKD. We stopped Januvia >> could not afford ($100/3 mo)  We cannot use Levemir >> developed a severe skin reaction We stopped Amaryl 4 mg.  He checks sugars 3 times a day - forgot his log: - am: 152-220 >> 80-158, 170 >> 84-164, 200 >> 80-147 - Before lunch:  87-173, 212 >> 69, 81-169, 214 >> 80-160s - before dinner: 66, 72-157, 209 >> 60, 62-156, 201 >> 70-135 - Before bedtime:  115-253, 325 >> 89-169 >> 57, 120-145 Lowest sugar was  60 >> 50s;  it is unclear at which level he has hypoglycemia unawareness. Highest sugar was  214 >> 293 (steroids).  Meals: - Breakfast: oatmeal + toast + tomatoes - Lunch: beans  - Dinner:home cooked veggies   + CKD, last BUN/creatinine was:  Lab Results  Component Value Date   BUN 29 (H) 04/26/2018   CREATININE 1.56 (H) 04/26/2018   Latest ACR was normal: Lab Results  Component Value Date   MICRALBCREAT 5.1 08/02/2014   MICRALBCREAT 0.7 02/23/2013   Latest GFR was lower: Lab Results  Component Value Date   GFRAA 39 (L) 04/23/2018   GFRAA 58 (L) 10/23/2017   GFRAA 53 (L) 10/20/2017   GFRAA 51 (L)  07/15/2017   GFRAA 51 (L) 02/26/2017   GFRAA 53 (L) 08/05/2016   GFRAA 48 (L) 08/02/2016   GFRAA 41 (L) 08/01/2016   GFRAA 58 (L) 07/31/2016   GFRAA 56 (L) 07/30/2016   + HL; Last lipids - higher TG: Lab Results  Component Value Date   CHOL 135 01/24/2018   HDL 25.20 (L) 01/24/2018   LDLCALC 31 04/09/2014   LDLDIRECT 69.0 01/24/2018   TRIG 286.0 (H) 01/24/2018   CHOLHDL 5 01/24/2018  He is on Lipitor, fenofibrate, flax oil. Pt's last eye exam was 09/2015: No DR. Had cataract sx in 2011. Denies  numbness and tingling in his legs.  He had back surgery in 03/2016 and 10/2017.  He developed a kidney stone after his last surgery. He was admitted 2x in 2017 for: CP/SOB >> AMI.  He had 2 stents placed then. He had a cardiac cath 02/2017 >> No new blockages.   PMH: Pt also also has a history of CAD, HTN, CKD stage III, history of nephrolithiasis, obesity, hiatal hernia, OSA, melanoma, spinal stenosis.  ROS: Constitutional: no weight gain/no weight loss, no fatigue, no subjective hyperthermia, no subjective hypothermia, + increased urination-chronic Eyes: no blurry vision, no xerophthalmia ENT: no sore throat, no nodules palpated in throat, no dysphagia, no odynophagia, no hoarseness Cardiovascular: no CP/no SOB/no palpitations/no leg  swelling Respiratory: no cough/no SOB/no wheezing Gastrointestinal: no N/no V/no D/no C/no acid reflux Musculoskeletal: no muscle aches/+ joint aches (back pain) Skin: no rashes, no hair loss Neurological: no tremors/no numbness/no tingling/no dizziness  I reviewed pt's medications, allergies, PMH, social hx, family hx, and changes were documented in the history of present illness. Otherwise, unchanged from my initial visit note.  Past Medical History:  Diagnosis Date  . AAA (abdominal aortic aneurysm) ([place]) 12/15/2014   a. Mild aneurysmal dilatation of the infrarenal abdominal aorta 3.2 cm - f/u due by 2019.  . Arthritis    "all over" (07/30/2016)   . CAD (coronary artery disease)    a. stent to LAD 2000. b. possible spasm by cath 2001. c. IVUS/PTCA/DES to mLAD 05/2013. d. PTCA/DES of dRCA into ostial rPDA 07/2013. e. PTCA of OM2, DES to Cx, DES to [facility] 07/2016.  Marland Kitchen Chronic bronchitis ([place])   . Chronic diastolic CHF (congestive heart failure) ([place])   . Chronic lower back pain   . Chronic neck pain   . CKD (chronic kidney disease), stage III ([place])   . Diabetic peripheral neuropathy ([place])   . Ejection fraction    55%, 07/2010, mild inferior hypo  . GERD (gastroesophageal reflux disease)   . Gout   . H/O diverticulitis of colon 01/31/2015  . H/O hiatal hernia   . History of blood transfusion ~ 10/1940   "had pneumonia"  . History of kidney stones   . HTN (hypertension)   . Hyperlipidemia   . Melanoma of forearm, left ([place]) 02/2011   with wide excision   . Myocardial infarction ([place])   . Nephrolithiasis   . Obesity   . OSA on CPAP     [name] since 2000  . Peripheral neuropathy    both feet  . Positive D-dimer    a. significant elevation ,hospital 07/2010, etiology unclear. b. D Dimer chronically > 20.  Marland Kitchen Renal insufficiency   . Skin cancer   . Spinal stenosis    a. s/p surgical repair 2013  . Spinal stenosis of lumbar region   . Type II diabetes mellitus ([place])   . Ventral hernia    Past Surgical History:  Procedure Laterality Date  . ANTERIOR LAT LUMBAR FUSION  10/22/2017   Procedure: Lumbar two-three Lumbar three-four Lumbar four-five Anteriolateral lumbar interbody fusion with percutaneous pedicle screw fixation and infuse;  Surgeon: [name], MD;  Location: [place];  Service: Neurosurgery;;  . APPLICATION OF ROBOTIC ASSISTANCE FOR SPINAL PROCEDURE  10/22/2017   Procedure: APPLICATION OF ROBOTIC ASSISTANCE FOR SPINAL PROCEDURE;  Surgeon: [name], MD;  Location: [facility];  Service: Neurosurgery;;  . BACK SURGERY    . CARDIAC CATHETERIZATION N/A 07/30/2016   Procedure: Left Heart Cath and Coronary Angiography;   Surgeon: Nelva Bush, MD;  Location: [place] CV LAB;  Service: Cardiovascular;  Laterality: N/A;  . CARDIAC CATHETERIZATION N/A 07/30/2016   Procedure: Coronary Stent Intervention;  Surgeon: Nelva Bush, MD;  Location: [place] CV LAB;  Service: Cardiovascular;  Laterality: N/A;  Mid CFX and MID RCA  . CARDIAC CATHETERIZATION N/A 07/30/2016   Procedure: Coronary Balloon Angioplasty;  Surgeon: Nelva Bush, MD;  Location: [place] CV LAB;  Service: Cardiovascular;  Laterality: N/A;  OM 1  . CARPAL TUNNEL RELEASE Left   . CATARACT EXTRACTION W/ INTRAOCULAR LENS  IMPLANT, BILATERAL Bilateral ~ 2014  . CERVICAL DISC SURGERY  1990s  . CORONARY ANGIOPLASTY WITH STENT PLACEMENT  05/2013  . CORONARY ANGIOPLASTY  WITH STENT PLACEMENT  07/26/2013   DES to RCA extending to PDA    . CORONARY ANGIOPLASTY WITH STENT PLACEMENT  07/30/2016  . CORONARY ANGIOPLASTY WITH STENT PLACEMENT  2000   CAD  . DENTAL SURGERY  04/2016   "got infected; had to dig it out"  . EYE SURGERY    . KNEE ARTHROSCOPY Right 1990's   rt  . LEFT AND RIGHT HEART CATHETERIZATION WITH CORONARY ANGIOGRAM N/A 07/26/2013   Procedure: LEFT AND RIGHT HEART CATHETERIZATION WITH CORONARY ANGIOGRAM;  Surgeon: [name], MD;  Location: [facility] CATH LAB;  Service: Cardiovascular;  Laterality: N/A;  . LEFT HEART CATH AND CORONARY ANGIOGRAPHY N/A 02/26/2017   Procedure: Left Heart Cath and Coronary Angiography;  Surgeon: Sherren Mocha, MD;  Location: [place] CV LAB;  Service: Cardiovascular;  Laterality: N/A;  . LEFT HEART CATHETERIZATION WITH CORONARY ANGIOGRAM N/A 05/19/2013   Procedure: LEFT HEART CATHETERIZATION WITH CORONARY ANGIOGRAM;  Surgeon: [name], MD;  Location: [facility] CATH LAB;  Service: Cardiovascular;  Laterality: N/A;  . LUMBAR LAMINECTOMY/DECOMPRESSION MICRODISCECTOMY  08/30/2012   Procedure: LUMBAR LAMINECTOMY/DECOMPRESSION MICRODISCECTOMY 2 LEVELS;  Surgeon: [name], MD;  Location: [place] NEURO ORS;   Service: Neurosurgery;  Laterality: Bilateral;  Bilateral Lumbar three-four Lumbar four-five Laminotomies  . LUMBAR PERCUTANEOUS PEDICLE SCREW 1 LEVEL Bilateral 10/22/2017   Procedure: LUMBAR PERCUTANEOUS PEDICLE SCREW 1 LEVEL;  Surgeon: [name], MD;  Location: [place];  Service: Neurosurgery;  Laterality: Bilateral;  . LUMBAR SPINE SURGERY  04/07/2016   Dr. Ellene Route; "?ruptured disc"  . MELANOMA EXCISION Left 02/2011   forearm  . NASAL SINUS SURGERY  1970s   "cut windows in sinus pockets"  . PERCUTANEOUS CORONARY STENT INTERVENTION (PCI-S)  05/19/2013   Procedure: PERCUTANEOUS CORONARY STENT INTERVENTION (PCI-S);  Surgeon: [name], MD;  Location: [facility] CATH LAB;  Service: Cardiovascular;;  . ULNAR TUNNEL RELEASE Left 04/07/2016   Dr. Ellene Route   Social History   Socioeconomic History  . Marital status: Married    Spouse name: Not on file  . Number of children: Not on file  . Years of education: Not on file  . Highest education level: Not on file  Occupational History  . Occupation: [employer]  . Financial resource strain: Not on file  . Food insecurity:    Worry: Not on file    Inability: Not on file  . Transportation needs:    Medical: Not on file    Non-medical: Not on file  Tobacco Use  . Smoking status: Former Smoker    Packs/day: 3.00    Years: 30.00    Pack years: 90.00    Types: Cigarettes    Last attempt to quit: 09/17/1996    Years since quitting: 21.6  . Smokeless tobacco: Never Used  Substance and Sexual Activity  . Alcohol use: No  . Drug use: No  . Sexual activity: Not Currently    Birth control/protection: None  Lifestyle  . Physical activity:    Days per week: Not on file    Minutes per session: Not on file  . Stress: Not on file  Relationships  . Social connections:    Talks on phone: Not on file    Gets together: Not on file    Attends religious service: Not on file    Active member of club or organization: Not on file    Attends  meetings of clubs or organizations: Not on file    Relationship status: Not on  file  . Intimate partner violence:    Fear of current or ex partner: Not on file    Emotionally abused: Not on file    Physically abused: Not on file    Forced sexual activity: Not on file  Other Topics Concern  . Not on file  Social History Narrative   Married and lives locally with his wife.  Sharyon Cable.   Current Outpatient Medications on File Prior to Visit  Medication Sig Dispense Refill  . acetaminophen (TYLENOL) 500 MG tablet Take 1,000 mg by mouth 2 (two) times daily.    Marland Kitchen albuterol (PROVENTIL HFA;VENTOLIN HFA) 108 (90 Base) MCG/ACT inhaler Inhale 2 puffs into the lungs every 6 (six) hours as needed for wheezing or shortness of breath. 1 Inhaler 0  . allopurinol (ZYLOPRIM) 300 MG tablet Take 1 tablet (300 mg total) by mouth daily. 90 tablet 0  . aspirin EC 81 MG tablet Take 81 mg by mouth daily.    Marland Kitchen atorvastatin (LIPITOR) 40 MG tablet Take 1 tablet (40 mg total) by mouth daily. 90 tablet 1  . B Complex Vitamins (B COMPLEX-B12 PO) Take 1 tablet by mouth at bedtime.    . BD INSULIN SYRINGE ULTRAFINE 31G X 5/16" 1 ML MISC USE  TO INJECT  INSULIN  FIVE  TIMES  DAILY AS INSTRUCTED 450 each 3  . chlorpheniramine (CHLOR-TRIMETON) 4 MG tablet Take 4 mg by mouth daily.    . Cholecalciferol (VITAMIN D) 1000 UNITS capsule Take 1,000 Units by mouth at bedtime.     . fenofibrate 160 MG tablet Take 1 tablet (160 mg total) by mouth at bedtime. 90 tablet 1  . fluocinonide cream (LIDEX) 3.32 % Apply 1 application topically daily as needed (for rash).     . fluticasone (FLONASE) 50 MCG/ACT nasal spray Place 2 sprays into both nostrils daily. 16 g 1  . furosemide (LASIX) 40 MG tablet TAKE 1 AND 1/2 TABLETS EVERY DAY (Patient taking differently: Take 60 mg by mouth every day) 135 tablet 2  . HYDROcodone-acetaminophen (NORCO/VICODIN) 5-325 MG tablet Take 1-2 tablets by mouth every 4 (four) hours as needed for severe pain  ((score 7 to 10)). 60 tablet 0  . insulin NPH Human (NOVOLIN N) 100 UNIT/ML injection Inject 45 units in am and 55 in the evening (Patient taking differently: Inject 45-50 Units into the skin See admin instructions. Inject 45 units SQ in the morning and inject 50 units SQ at bedtime) 30 mL 3  . insulin regular (NOVOLIN R RELION) 100 units/mL injection Inject 0.25-0.3 mLs (25-30 Units total) into the skin 3 (three) times daily before meals. (Patient taking differently: Inject 35 Units into the skin 3 (three) times daily before meals. ) 70 mL 3  . isosorbide mononitrate (IMDUR) 30 MG 24 hr tablet Take 1 tablet (30 mg total) by mouth daily. 90 tablet 3  . metoprolol tartrate (LOPRESSOR) 50 MG tablet Take 1 tablet (50 mg total) by mouth 2 (two) times daily. 180 tablet 1  . mupirocin ointment (BACTROBAN) 2 % Apply to area thin film twice daily 22 g 0  . nitroGLYCERIN (NITROSTAT) 0.4 MG SL tablet Place 1 tablet (0.4 mg total) under the tongue every 5 (five) minutes as needed for chest pain. 100 tablet 3  . pantoprazole (PROTONIX) 20 MG tablet TAKE 1 TABLET (20 MG TOTAL) BY MOUTH DAILY. 90 tablet 3  . Probiotic Product (PROBIOTIC PO) Take 1 capsule by mouth daily.    . tamsulosin (FLOMAX) 0.4 MG CAPS  capsule Take 1 capsule (0.4 mg total) by mouth daily. 90 capsule 1  . tiZANidine (ZANAFLEX) 4 MG capsule Take 1 capsule (4 mg total) by mouth 4 (four) times daily as needed for muscle spasms. Take 1 tablet every 6 hours as needed; not to exceed 3 doses in 24 hours. 40 capsule 2  . TRUE METRIX BLOOD GLUCOSE TEST test strip USE TO TEST BLOOD SUGAR THREE TIMES DAILY AS DIRECTED 300 each 3   No current facility-administered medications on file prior to visit.    Allergies  Allergen Reactions  . Levemir [Insulin Detemir] Swelling and Other (See Comments)    Patient had redness and swelling and tenderness at injection site.  . Codeine Other (See Comments)    Makes him "shakey", is OK with hydrocodone GI UPSET &  TREMORS  . Lipitor [Atorvastatin] Other (See Comments)    Muscle aches; liver functions. Patient is currently taken  . Novolog Mix [Insulin Aspart Prot & Aspart] Other (See Comments)    Causes skin to swell/itch at injection site  . Other Other (See Comments)    Brilinta caused SOB   Family History  Problem Relation Age of Onset  . Cancer Mother        intestinal   . Heart disease Mother   . Cancer Father        prostate  . Migraines Daughter   . Leukemia Sister   . Heart disease Sister   . Prostate cancer Unknown   . Kidney cancer Unknown   . Cancer Unknown        Bladder cancer  . Coronary artery disease Unknown   . Hypertension Sister   . Hypertension Brother   . Heart attack Neg Hx   . Stroke Neg Hx     PE: BP 122/62   Pulse 82   Ht 5\' 10"  (1.778 m)   Wt 248 lb 3.2 oz (112.6 kg) Comment: with heavy clothes  SpO2 97%   BMI 35.61 kg/m  Body mass index is 35.61 kg/m. Wt Readings from Last 3 Encounters:  05/09/18 248 lb 3.2 oz (112.6 kg)  04/26/18 251 lb 1.6 oz (113.9 kg)  04/23/18 246 lb (111.6 kg)   Constitutional: overweight, in NAD Eyes: PERRLA, EOMI, no exophthalmos ENT: moist mucous membranes, no thyromegaly, no cervical lymphadenopathy Cardiovascular: RRR, No MRG Respiratory: CTA B Gastrointestinal: abdomen soft, NT, ND, BS+ Musculoskeletal: no deformities, strength intact in all 4 Skin: moist, warm, no rashes Neurological: no tremor with outstretched hands, DTR normal in all 4  ASSESSMENT: 1. DM2, insulin-dependent, uncontrolled, with complications - CAD, s/p stents - He had stenting of distal RCA/PDA in 07/2013. Prior stenting of LAD in 04/2013. 2 new stents: PCI to left circumflex and the mid RCA on 07/30/2016. - AAA - CKD stage 3  2. HL  3.  Obesity class II BMI Classification:  < 18.5 underweight   18.5-24.9 normal weight   25.0-29.9 overweight   30.0-34.9 class I obesity   35.0-39.9 class II obesity   ? 40.0 class III obesity    PLAN:  1. Patient with history of uncontrolled type 2 diabetes, on basal-bolus insulin regimen, with significant improvement in his sugars in the last several months, after he eliminated meat from his diet almost completely.  He also lost a significant amount of weight and sugars were better.  At last visit, sugars were mostly at goal, with few hyperglycemic spikes and his HbA1c was excellent at 5.7%. - At this visit, sugars are  still at or close to goal, however, he tells me that he sometimes skips the entire insulin dose with dinner if his sugars are at or lower than 100.  This does include both regular and NPH insulin.  He is not taking the lower dose, as advised at last visit.  At this visit, since his CBGs are lower before dinner, will decrease the insulin with lunch.  Also, as he tells me that he is sometimes afraid to take the whole dose of insulin before dinner due to previous sugars in the 50s at bedtime, will also reduce his dinnertime regular and NPH insulin.  I advised him not to skip them completely even if his sugars are at goal, if he plans to eat. - I advised him to: Patient Instructions  Please continue: Insulin Before breakfast Before lunch Before dinner  Regular 35 30-35 35 (if sugars <100, take 15 units)  NPH 45  50   Please return in 4 months with your sugar log.   - today, HbA1c is 6.1% (higher, but still at goal) - continue checking sugars at different times of the day - check 3x a day, rotating checks - advised for yearly eye exams >> he is UTD not - Return to clinic in 4 mo with sugar log      2. HL - Reviewed latest lipid panel From 01/2018: LDL at goal, triglycerides high, HDL low Lab Results  Component Value Date   CHOL 135 01/24/2018   HDL 25.20 (L) 01/24/2018   LDLCALC 31 04/09/2014   LDLDIRECT 69.0 01/24/2018   TRIG 286.0 (H) 01/24/2018   CHOLHDL 5 01/24/2018  - Continues Lipitor and fenofibrate (+ flax oil) without side effects.  3.  Obesity  -  weight stable, did not lose weight since last visit - Strongly encouraged him to stay on the plant-based diet  [name], MD PhD [facility] Endocrinology

## 2018-05-10 LAB — POCT GLYCOSYLATED HEMOGLOBIN (HGB A1C): Hemoglobin A1C: 6.1 % — AB (ref 4.0–5.6)

## 2018-05-10 NOTE — Addendum Note (Signed)
Addended by: Drucilla Schmidt on: 05/10/2018 08:03 AM   Modules accepted: Orders

## 2018-05-13 ENCOUNTER — Telehealth: Payer: Self-pay | Admitting: Cardiology

## 2018-05-13 DIAGNOSIS — R072 Precordial pain: Secondary | ICD-10-CM

## 2018-05-13 MED ORDER — ISOSORBIDE MONONITRATE ER 30 MG PO TB24: 30.0000 mg | ORAL_TABLET | Freq: Every day | ORAL | 0 refills | Status: DC

## 2018-05-13 NOTE — Telephone Encounter (Signed)
New Message:        *STAT* If patient is at the pharmacy, call can be transferred to refill team.   1. Which medications need to be refilled? (please list name of each medication and dose if known) isosorbide mononitrate (IMDUR) 30 MG 24 hr tablet  2. Which pharmacy/location (including street and city if local pharmacy) is medication to be sent to?[place], [place]  3. Do they need a 30 day or 90 day supply? Small amount until the order from [place] comes in

## 2018-05-19 ENCOUNTER — Telehealth: Payer: Self-pay | Admitting: Family Medicine

## 2018-05-19 MED ORDER — ALLOPURINOL 300 MG PO TABS: 300.0000 mg | ORAL_TABLET | Freq: Every day | ORAL | 1 refills | Status: DC

## 2018-05-19 NOTE — Telephone Encounter (Signed)
Rx sent 

## 2018-05-19 NOTE — Telephone Encounter (Signed)
Copied from [place] [id number]. Topic: Quick Communication - Rx Refill/Question >> May 19, 2018  1:09 PM Gardiner Ramus wrote: Medication: allopurinol (ZYLOPRIM) 300 MG tablet    Has the patient contacted their pharmacy? No., because no refills  (Agent: If no, request that the patient contact the pharmacy for the refill.) (Agent: If yes, when and what did the pharmacy advise?)  Preferred Pharmacy (with phone number or street name): [place], [place] [phone] (Phone) [phone] (Fax)  Agent: Please be advised that RX refills may take up to 3 business days. We ask that you follow-up with your pharmacy.

## 2018-06-07 ENCOUNTER — Ambulatory Visit (INDEPENDENT_AMBULATORY_CARE_PROVIDER_SITE_OTHER): Payer: Medicare HMO | Admitting: Family Medicine

## 2018-06-07 ENCOUNTER — Ambulatory Visit (HOSPITAL_BASED_OUTPATIENT_CLINIC_OR_DEPARTMENT_OTHER)
Admission: RE | Admit: 2018-06-07 | Discharge: 2018-06-07 | Disposition: A | Discharge status: Home / Self Care | Payer: Medicare HMO | Source: Ambulatory Visit | Attending: Family Medicine | Admitting: Family Medicine

## 2018-06-07 VITALS — BP 118/56 | HR 84 | Temp 98.4°F | Resp 18 | Wt 245.4 lb

## 2018-06-07 DIAGNOSIS — M25551 Pain in right hip: Secondary | ICD-10-CM

## 2018-06-07 DIAGNOSIS — M545 Low back pain: Secondary | ICD-10-CM | POA: Insufficient documentation

## 2018-06-07 DIAGNOSIS — R9430 Abnormal result of cardiovascular function study, unspecified: Secondary | ICD-10-CM

## 2018-06-07 DIAGNOSIS — I10 Essential (primary) hypertension: Secondary | ICD-10-CM

## 2018-06-07 DIAGNOSIS — Z794 Long term (current) use of insulin: Secondary | ICD-10-CM

## 2018-06-07 DIAGNOSIS — S79911A Unspecified injury of right hip, initial encounter: Secondary | ICD-10-CM | POA: Diagnosis not present

## 2018-06-07 DIAGNOSIS — N289 Disorder of kidney and ureter, unspecified: Secondary | ICD-10-CM

## 2018-06-07 DIAGNOSIS — R252 Cramp and spasm: Secondary | ICD-10-CM | POA: Diagnosis not present

## 2018-06-07 DIAGNOSIS — M549 Dorsalgia, unspecified: Secondary | ICD-10-CM | POA: Diagnosis not present

## 2018-06-07 DIAGNOSIS — E1159 Type 2 diabetes mellitus with other circulatory complications: Secondary | ICD-10-CM

## 2018-06-07 DIAGNOSIS — S3992XA Unspecified injury of lower back, initial encounter: Secondary | ICD-10-CM | POA: Diagnosis not present

## 2018-06-07 DIAGNOSIS — E1165 Type 2 diabetes mellitus with hyperglycemia: Secondary | ICD-10-CM

## 2018-06-07 DIAGNOSIS — Z9889 Other specified postprocedural states: Secondary | ICD-10-CM | POA: Insufficient documentation

## 2018-06-07 DIAGNOSIS — IMO0002 Reserved for concepts with insufficient information to code with codable children: Secondary | ICD-10-CM

## 2018-06-07 NOTE — Patient Instructions (Addendum)
hyland's leg cramp medicine  Drop the Lasix/Furosemide 40 mg tabs to 1 tab every day. If no weight gain or swelling can drop to 1 tab every other day alternating with 1/2 tab every other day and continue to weight yourself daily and let us know if any trouble Muscle Cramps and Spasms Muscle cramps and spasms are when muscles tighten by themselves. They usually get better within minutes. Muscle cramps are painful. They are usually stronger and last longer than muscle spasms. Muscle spasms may or may not be painful. They can last a few seconds or much longer. Follow these instructions at home:  Drink enough fluid to keep your pee (urine) clear or pale yellow.  Massage, stretch, and relax the muscle.  If directed, apply heat to tight or tense muscles as often as told by your doctor. Use the heat source that your doctor recommends. ? Place a towel between your skin and the heat source. ? Leave the heat on for 20-30 minutes. ? Take off the heat if your skin turns bright red. This is especially important if you are unable to feel pain, heat, or cold. You may have a greater risk of getting burned.  If directed, put ice on the affected area. This may help if you are sore or have pain after a cramp or spasm. ? Put ice in a plastic bag. ? Place a towel between your skin and the bag. ? Leave the ice on for 20 minutes, 2-3 times a day.  Take over-the-counter and prescription medicines only as told by your doctor.  Pay attention to any changes in your symptoms. Contact a doctor if:  Your cramps or spasms get worse or happen more often.  Your cramps or spasms do not get better with time. This information is not intended to replace advice given to you by your health care provider. Make sure you discuss any questions you have with your health care provider. Document Released: 09/10/2008 Document Revised: 10/30/2015 Document Reviewed: 07/02/2015 Elsevier Interactive Patient Education  2018 [employer].

## 2018-06-07 NOTE — Assessment & Plan Note (Signed)
Well controlled, no changes to meds. Encouraged heart healthy diet such as the DASH diet and exercise as tolerated.  °

## 2018-06-07 NOTE — Assessment & Plan Note (Signed)
45-50% EF drop Lasix

## 2018-06-07 NOTE — Progress Notes (Signed)
Subjective:  I acted as a Education administrator for Dr. Charlett Blake. [name], [place]  Patient ID: William Hendrix, male    DOB: 04-12-1941, 77 y.o.   MRN: [id number]  No chief complaint on file.   HPI  Patient is in today for 6 week follow up. Patient c/o cramps in both his legs and feet. He also fell putting his pants on last week now has right hip pain. He did have a fall about a week ago and his pain has increased since then. No significant increase in his low back pain. That pain has impoved in the back since his surgery. He is also noting significant muscle cramps in both legs. No incontinence. No febrile illness or hospitalizations. Denies CP/palp/SOB/HA/congestion/fevers/GI or GU c/o. Taking meds as prescribed  Patient Care Team: [name], MD as PCP - General (Family Medicine) [name], MD (Neurosurgery) Lelon Perla, MD as Consulting Physician (Cardiology) Rutherford Guys, MD as Consulting Physician (Ophthalmology) [name], MD as Consulting Physician (Internal Medicine)   Past Medical History:  Diagnosis Date  . AAA (abdominal aortic aneurysm) ([place]) 12/15/2014   a. Mild aneurysmal dilatation of the infrarenal abdominal aorta 3.2 cm - f/u due by 2019.  . Arthritis    "all over" (07/30/2016)  . CAD (coronary artery disease)    a. stent to LAD 2000. b. possible spasm by cath 2001. c. IVUS/PTCA/DES to mLAD 05/2013. d. PTCA/DES of dRCA into ostial rPDA 07/2013. e. PTCA of OM2, DES to Cx, DES to [facility] 07/2016.  Marland Kitchen Chronic bronchitis ([place])   . Chronic diastolic CHF (congestive heart failure) ([place])   . Chronic lower back pain   . Chronic neck pain   . CKD (chronic kidney disease), stage III ([place])   . Diabetic peripheral neuropathy ([place])   . Ejection fraction    55%, 07/2010, mild inferior hypo  . GERD (gastroesophageal reflux disease)   . Gout   . H/O diverticulitis of colon 01/31/2015  . H/O hiatal hernia   . History of blood transfusion ~ 10/1940   "had pneumonia"  . History  of kidney stones   . HTN (hypertension)   . Hyperlipidemia   . Melanoma of forearm, left ([place]) 02/2011   with wide excision   . Myocardial infarction ([place])   . Nephrolithiasis   . Obesity   . OSA on CPAP     [name] since 2000  . Peripheral neuropathy    both feet  . Positive D-dimer    a. significant elevation ,hospital 07/2010, etiology unclear. b. D Dimer chronically > 20.  Marland Kitchen Renal insufficiency   . Skin cancer   . Spinal stenosis    a. s/p surgical repair 2013  . Spinal stenosis of lumbar region   . Type II diabetes mellitus ([place])   . Ventral hernia     Past Surgical History:  Procedure Laterality Date  . ANTERIOR LAT LUMBAR FUSION  10/22/2017   Procedure: Lumbar two-three Lumbar three-four Lumbar four-five Anteriolateral lumbar interbody fusion with percutaneous pedicle screw fixation and infuse;  Surgeon: [name], MD;  Location: [facility];  Service: Neurosurgery;;  . APPLICATION OF ROBOTIC ASSISTANCE FOR SPINAL PROCEDURE  10/22/2017   Procedure: APPLICATION OF ROBOTIC ASSISTANCE FOR SPINAL PROCEDURE;  Surgeon: [name], MD;  Location: [facility];  Service: Neurosurgery;;  . BACK SURGERY    . CARDIAC CATHETERIZATION N/A 07/30/2016   Procedure: Left Heart Cath and Coronary Angiography;  Surgeon: Nelva Bush, MD;  Location: [facility] CV LAB;  Service: Cardiovascular;  Laterality: N/A;  . CARDIAC CATHETERIZATION N/A 07/30/2016   Procedure: Coronary Stent Intervention;  Surgeon: Nelva Bush, MD;  Location: [facility] CV LAB;  Service: Cardiovascular;  Laterality: N/A;  Mid CFX and MID RCA  . CARDIAC CATHETERIZATION N/A 07/30/2016   Procedure: Coronary Balloon Angioplasty;  Surgeon: Nelva Bush, MD;  Location: [facility] CV LAB;  Service: Cardiovascular;  Laterality: N/A;  OM 1  . CARPAL TUNNEL RELEASE Left   . CATARACT EXTRACTION W/ INTRAOCULAR LENS  IMPLANT, BILATERAL Bilateral ~ 2014  . CERVICAL DISC SURGERY  1990s  . CORONARY ANGIOPLASTY WITH STENT PLACEMENT   05/2013  . CORONARY ANGIOPLASTY WITH STENT PLACEMENT  07/26/2013   DES to RCA extending to PDA    . CORONARY ANGIOPLASTY WITH STENT PLACEMENT  07/30/2016  . CORONARY ANGIOPLASTY WITH STENT PLACEMENT  2000   CAD  . DENTAL SURGERY  04/2016   "got infected; had to dig it out"  . EYE SURGERY    . KNEE ARTHROSCOPY Right 1990's   rt  . LEFT AND RIGHT HEART CATHETERIZATION WITH CORONARY ANGIOGRAM N/A 07/26/2013   Procedure: LEFT AND RIGHT HEART CATHETERIZATION WITH CORONARY ANGIOGRAM;  Surgeon: [name], MD;  Location: [facility] CATH LAB;  Service: Cardiovascular;  Laterality: N/A;  . LEFT HEART CATH AND CORONARY ANGIOGRAPHY N/A 02/26/2017   Procedure: Left Heart Cath and Coronary Angiography;  Surgeon: Sherren Mocha, MD;  Location: [facility] [facility] CV LAB;  Service: Cardiovascular;  Laterality: N/A;  . LEFT HEART CATHETERIZATION WITH CORONARY ANGIOGRAM N/A 05/19/2013   Procedure: LEFT HEART CATHETERIZATION WITH CORONARY ANGIOGRAM;  Surgeon: [name], MD;  Location: [facility] CATH LAB;  Service: Cardiovascular;  Laterality: N/A;  . LUMBAR LAMINECTOMY/DECOMPRESSION MICRODISCECTOMY  08/30/2012   Procedure: LUMBAR LAMINECTOMY/DECOMPRESSION MICRODISCECTOMY 2 LEVELS;  Surgeon: [name], MD;  Location: [facility] NEURO ORS;  Service: Neurosurgery;  Laterality: Bilateral;  Bilateral Lumbar three-four Lumbar four-five Laminotomies  . LUMBAR PERCUTANEOUS PEDICLE SCREW 1 LEVEL Bilateral 10/22/2017   Procedure: LUMBAR PERCUTANEOUS PEDICLE SCREW 1 LEVEL;  Surgeon: [name], MD;  Location: [facility];  Service: Neurosurgery;  Laterality: Bilateral;  . LUMBAR SPINE SURGERY  04/07/2016   Dr. Ellene Route; "?ruptured disc"  . MELANOMA EXCISION Left 02/2011   forearm  . NASAL SINUS SURGERY  1970s   "cut windows in sinus pockets"  . PERCUTANEOUS CORONARY STENT INTERVENTION (PCI-S)  05/19/2013   Procedure: PERCUTANEOUS CORONARY STENT INTERVENTION (PCI-S);  Surgeon: [name], MD;  Location: [facility] CATH LAB;  Service:  Cardiovascular;;  . ULNAR TUNNEL RELEASE Left 04/07/2016   Dr. Ellene Route    Family History  Problem Relation Age of Onset  . Cancer Mother        intestinal   . Heart disease Mother   . Cancer Father        prostate  . Migraines Daughter   . Leukemia Sister   . Heart disease Sister   . Prostate cancer Unknown   . Kidney cancer Unknown   . Cancer Unknown        Bladder cancer  . Coronary artery disease Unknown   . Hypertension Sister   . Hypertension Brother   . Heart attack Neg Hx   . Stroke Neg Hx     Social History   Socioeconomic History  . Marital status: Married    Spouse name: Not on file  . Number of children: Not on file  . Years of education: Not on file  . Highest education level: Not on  file  Occupational History  . Occupation: [employer]  . Financial resource strain: Not on file  . Food insecurity:    Worry: Not on file    Inability: Not on file  . Transportation needs:    Medical: Not on file    Non-medical: Not on file  Tobacco Use  . Smoking status: Former Smoker    Packs/day: 3.00    Years: 30.00    Pack years: 90.00    Types: Cigarettes    Last attempt to quit: 09/17/1996    Years since quitting: 21.7  . Smokeless tobacco: Never Used  Substance and Sexual Activity  . Alcohol use: No  . Drug use: No  . Sexual activity: Not Currently    Birth control/protection: None  Lifestyle  . Physical activity:    Days per week: Not on file    Minutes per session: Not on file  . Stress: Not on file  Relationships  . Social connections:    Talks on phone: Not on file    Gets together: Not on file    Attends religious service: Not on file    Active member of club or organization: Not on file    Attends meetings of clubs or organizations: Not on file    Relationship status: Not on file  . Intimate partner violence:    Fear of current or ex partner: Not on file    Emotionally abused: Not on file    Physically abused: Not on file     Forced sexual activity: Not on file  Other Topics Concern  . Not on file  Social History Narrative   Married and lives locally with his wife.  Sharyon Cable.    Outpatient Medications Prior to Visit  Medication Sig Dispense Refill  . acetaminophen (TYLENOL) 500 MG tablet Take 1,000 mg by mouth 2 (two) times daily.    Marland Kitchen albuterol (PROVENTIL HFA;VENTOLIN HFA) 108 (90 Base) MCG/ACT inhaler Inhale 2 puffs into the lungs every 6 (six) hours as needed for wheezing or shortness of breath. 1 Inhaler 0  . allopurinol (ZYLOPRIM) 300 MG tablet Take 1 tablet (300 mg total) by mouth daily. 90 tablet 1  . aspirin EC 81 MG tablet Take 81 mg by mouth daily.    Marland Kitchen atorvastatin (LIPITOR) 40 MG tablet Take 1 tablet (40 mg total) by mouth daily. 90 tablet 1  . B Complex Vitamins (B COMPLEX-B12 PO) Take 1 tablet by mouth at bedtime.    . BD INSULIN SYRINGE ULTRAFINE 31G X 5/16" 1 ML MISC USE  TO INJECT  INSULIN  FIVE  TIMES  DAILY AS INSTRUCTED 450 each 3  . chlorpheniramine (CHLOR-TRIMETON) 4 MG tablet Take 4 mg by mouth daily.    . Cholecalciferol (VITAMIN D) 1000 UNITS capsule Take 1,000 Units by mouth at bedtime.     . fenofibrate 160 MG tablet Take 1 tablet (160 mg total) by mouth at bedtime. 90 tablet 1  . fluocinonide cream (LIDEX) 1.61 % Apply 1 application topically daily as needed (for rash).     . fluticasone (FLONASE) 50 MCG/ACT nasal spray Place 2 sprays into both nostrils daily. 16 g 1  . furosemide (LASIX) 40 MG tablet TAKE 1 AND 1/2 TABLETS EVERY DAY (Patient taking differently: Take 60 mg by mouth every day) 135 tablet 2  . HYDROcodone-acetaminophen (NORCO/VICODIN) 5-325 MG tablet Take 1-2 tablets by mouth every 4 (four) hours as needed for severe pain ((score 7 to 10)). 60 tablet 0  .  insulin NPH Human (NOVOLIN N) 100 UNIT/ML injection Inject 45 units in am and 40 in the evening 30 mL 3  . insulin regular (NOVOLIN R RELION) 100 units/mL injection Inject 0.25-0.3 mLs (25-30 Units total) into the skin 3  (three) times daily before meals. (Patient taking differently: Inject 35 Units into the skin 3 (three) times daily before meals. ) 70 mL 3  . isosorbide mononitrate (IMDUR) 30 MG 24 hr tablet Take 1 tablet (30 mg total) by mouth daily. 30 tablet 0  . metoprolol tartrate (LOPRESSOR) 50 MG tablet Take 1 tablet (50 mg total) by mouth 2 (two) times daily. 180 tablet 1  . mupirocin ointment (BACTROBAN) 2 % Apply to area thin film twice daily 22 g 0  . nitroGLYCERIN (NITROSTAT) 0.4 MG SL tablet Place 1 tablet (0.4 mg total) under the tongue every 5 (five) minutes as needed for chest pain. 100 tablet 3  . pantoprazole (PROTONIX) 20 MG tablet TAKE 1 TABLET (20 MG TOTAL) BY MOUTH DAILY. 90 tablet 3  . Probiotic Product (PROBIOTIC PO) Take 1 capsule by mouth daily.    . tamsulosin (FLOMAX) 0.4 MG CAPS capsule Take 1 capsule (0.4 mg total) by mouth daily. 90 capsule 1  . tiZANidine (ZANAFLEX) 4 MG capsule Take 1 capsule (4 mg total) by mouth 4 (four) times daily as needed for muscle spasms. Take 1 tablet every 6 hours as needed; not to exceed 3 doses in 24 hours. 40 capsule 2  . TRUE METRIX BLOOD GLUCOSE TEST test strip USE TO TEST BLOOD SUGAR THREE TIMES DAILY AS DIRECTED 300 each 3   No facility-administered medications prior to visit.     Allergies  Allergen Reactions  . Levemir [Insulin Detemir] Swelling and Other (See Comments)    Patient had redness and swelling and tenderness at injection site.  . Codeine Other (See Comments)    Makes him "shakey", is OK with hydrocodone GI UPSET & TREMORS  . Lipitor [Atorvastatin] Other (See Comments)    Muscle aches; liver functions. Patient is currently taken  . Novolog Mix [Insulin Aspart Prot & Aspart] Other (See Comments)    Causes skin to swell/itch at injection site  . Other Other (See Comments)    Brilinta caused SOB    Review of Systems  Constitutional: Negative for fever and malaise/fatigue.  HENT: Negative for congestion.   Eyes: Negative for  blurred vision.  Respiratory: Negative for shortness of breath.   Cardiovascular: Negative for chest pain, palpitations and leg swelling.  Gastrointestinal: Negative for abdominal pain, blood in stool and nausea.  Genitourinary: Negative for dysuria and frequency.  Musculoskeletal: Positive for back pain and joint pain. Negative for falls.  Skin: Negative for rash.  Neurological: Negative for dizziness, loss of consciousness and headaches.  Endo/Heme/Allergies: Negative for environmental allergies.  Psychiatric/Behavioral: Negative for depression. The patient is not nervous/anxious.        Objective:    Physical Exam  Constitutional: He is oriented to person, place, and time. He appears well-developed and well-nourished. No distress.  HENT:  Head: Normocephalic and atraumatic.  Nose: Nose normal.  Eyes: Right eye exhibits no discharge. Left eye exhibits no discharge.  Neck: Normal range of motion. Neck supple.  Cardiovascular: Normal rate and regular rhythm.  No murmur heard. Pulmonary/Chest: Effort normal and breath sounds normal.  Abdominal: Soft. Bowel sounds are normal. There is no tenderness.  Musculoskeletal: He exhibits no edema.  Neurological: He is alert and oriented to person, place, and time.  Skin: Skin is warm and dry.  Psychiatric: He has a normal mood and affect.  Nursing note and vitals reviewed.   BP (!) 118/56 (BP Location: Left Arm, Patient Position: Sitting, Cuff Size: Normal)   Pulse 84   Temp 98.4 F (36.9 C) (Oral)   Resp 18   Wt 245 lb 6.4 oz (111.3 kg)   SpO2 96%   BMI 35.21 kg/m  Wt Readings from Last 3 Encounters:  06/07/18 245 lb 6.4 oz (111.3 kg)  05/09/18 248 lb 3.2 oz (112.6 kg)  04/26/18 251 lb 1.6 oz (113.9 kg)   BP Readings from Last 3 Encounters:  06/07/18 (!) 118/56  05/09/18 122/62  04/26/18 122/62     Immunization History  Administered Date(s) Administered  . Influenza Split 08/08/2012  . Influenza Whole 08/08/2009,  07/12/2010  . Influenza, High Dose Seasonal PF 08/02/2014, 07/11/2015, 08/26/2017  . Influenza,inj,Quad PF,6+ Mos 07/27/2013, 08/04/2016  . Pneumococcal Conjugate-13 08/02/2014  . Pneumococcal Polysaccharide-23 07/28/2005    Health Maintenance  Topic Date Due  . TETANUS/TDAP  03/13/1959  . URINE MICROALBUMIN  08/03/2015  . FOOT EXAM  05/21/2016  . OPHTHALMOLOGY EXAM  09/29/2016  . INFLUENZA VACCINE  05/12/2018  . HEMOGLOBIN A1C  11/10/2018  . PNA vac Low Risk Adult  Completed    Lab Results  Component Value Date   WBC 8.3 04/23/2018   HGB 14.4 04/23/2018   HCT 40.8 04/23/2018   PLT 216 04/23/2018   GLUCOSE 192 (H) 06/07/2018   CHOL 135 01/24/2018   TRIG 286.0 (H) 01/24/2018   HDL 25.20 (L) 01/24/2018   LDLDIRECT 69.0 01/24/2018   LDLCALC 31 04/09/2014   ALT 19 06/07/2018   AST 17 06/07/2018   NA 140 06/07/2018   K 4.3 06/07/2018   CL 98 06/07/2018   CREATININE 1.62 (H) 06/07/2018   BUN 37 (H) 06/07/2018   CO2 33 (H) 06/07/2018   TSH 3.39 01/24/2018   INR 1.1 02/24/2017   HGBA1C 6.1 (A) 05/10/2018   MICROALBUR 0.7 08/02/2014    Lab Results  Component Value Date   TSH 3.39 01/24/2018   Lab Results  Component Value Date   WBC 8.3 04/23/2018   HGB 14.4 04/23/2018   HCT 40.8 04/23/2018   MCV 91.5 04/23/2018   PLT 216 04/23/2018   Lab Results  Component Value Date   NA 140 06/07/2018   K 4.3 06/07/2018   CO2 33 (H) 06/07/2018   GLUCOSE 192 (H) 06/07/2018   BUN 37 (H) 06/07/2018   CREATININE 1.62 (H) 06/07/2018   BILITOT 0.5 06/07/2018   ALKPHOS 51 06/07/2018   AST 17 06/07/2018   ALT 19 06/07/2018   PROT 7.4 06/07/2018   ALBUMIN 4.4 06/07/2018   CALCIUM 10.6 (H) 06/07/2018   ANIONGAP 11 04/23/2018   GFR 43.99 (L) 06/07/2018   Lab Results  Component Value Date   CHOL 135 01/24/2018   Lab Results  Component Value Date   HDL 25.20 (L) 01/24/2018   Lab Results  Component Value Date   LDLCALC 31 04/09/2014   Lab Results  Component Value Date     TRIG 286.0 (H) 01/24/2018   Lab Results  Component Value Date   CHOLHDL 5 01/24/2018   Lab Results  Component Value Date   HGBA1C 6.1 (A) 05/10/2018         Assessment & Plan:   Problem List Items Addressed This Visit    Renal insufficiency    Hydrate well, monitor and decrease diuretic use  as able      Essential hypertension - Primary    Well controlled, no changes to meds. Encouraged heart healthy diet such as the DASH diet and exercise as tolerated.       Relevant Orders   Comprehensive metabolic panel (Completed)   DG Lumbar Spine Complete (Completed)   Ejection fraction    45-50% EF drop Lasix       Right hip pain   Relevant Orders   DG HIP UNILAT W OR W/O PELVIS 1V RIGHT (Completed)   Uncontrolled type 2 diabetes mellitus with circulatory disorder, with long-term current use of insulin ([place])    Follows with endocrinology minimize simple carbs. Increase exercise as tolerated.       Back pain    With some increased right hip pain worse after a fall a couple of weeks ago. Xray shows stable hardware and no acute concerns. Encouraged moist heat and gentle stretching as tolerated. May try NSAIDs and prescription meds as directed and report if symptoms worsen or seek immediate care      Muscle cramps    Hydrate well and try Hyland's leg cramp med. Check leabs.       Relevant Orders   Comprehensive metabolic panel (Completed)   Magnesium (Completed)      I am having William Hendrix. Lorain Childes "William Hendrix" maintain his Vitamin D, fluocinonide cream, aspirin EC, acetaminophen, B Complex Vitamins (B COMPLEX-B12 PO), albuterol, tiZANidine, BD INSULIN SYRINGE ULTRAFINE, TRUE METRIX BLOOD GLUCOSE TEST, furosemide, fluticasone, insulin regular, mupirocin ointment, chlorpheniramine, Probiotic Product (PROBIOTIC PO), HYDROcodone-acetaminophen, pantoprazole, tamsulosin, metoprolol tartrate, atorvastatin, fenofibrate, nitroGLYCERIN, insulin NPH Human, isosorbide mononitrate, and  allopurinol.  No orders of the defined types were placed in this encounter.   CMA served as Education administrator during this visit. History, Physical and Plan performed by medical provider. Documentation and orders reviewed and attested to.  Penni Homans, MD

## 2018-06-08 LAB — COMPREHENSIVE METABOLIC PANEL
ALBUMIN: 4.4 g/dL (ref 3.5–5.2)
ALT: 19 U/L (ref 0–53)
AST: 17 U/L (ref 0–37)
Alkaline Phosphatase: 51 U/L (ref 39–117)
BILIRUBIN TOTAL: 0.5 mg/dL (ref 0.2–1.2)
BUN: 37 mg/dL — AB (ref 6–23)
CALCIUM: 10.6 mg/dL — AB (ref 8.4–10.5)
CHLORIDE: 98 meq/L (ref 96–112)
CO2: 33 mEq/L — ABNORMAL HIGH (ref 19–32)
CREATININE: 1.62 mg/dL — AB (ref 0.40–1.50)
GFR: 43.99 mL/min — ABNORMAL LOW (ref >60.00)
Glucose, Bld: 192 mg/dL — ABNORMAL HIGH (ref 70–99)
Potassium: 4.3 mEq/L (ref 3.5–5.1)
SODIUM: 140 meq/L (ref 135–145)
TOTAL PROTEIN: 7.4 g/dL (ref 6.0–8.3)

## 2018-06-08 LAB — MAGNESIUM: MAGNESIUM: 2 mg/dL (ref 1.5–2.5)

## 2018-06-13 DIAGNOSIS — R252 Cramp and spasm: Secondary | ICD-10-CM | POA: Insufficient documentation

## 2018-06-13 NOTE — Assessment & Plan Note (Signed)
With some increased right hip pain worse after a fall a couple of weeks ago. Xray shows stable hardware and no acute concerns. Encouraged moist heat and gentle stretching as tolerated. May try NSAIDs and prescription meds as directed and report if symptoms worsen or seek immediate care

## 2018-06-13 NOTE — Assessment & Plan Note (Signed)
Hydrate well, monitor and decrease diuretic use as able

## 2018-06-13 NOTE — Assessment & Plan Note (Signed)
Hydrate well and try Hyland's leg cramp med. Check leabs.

## 2018-06-13 NOTE — Assessment & Plan Note (Addendum)
Follows with endocrinology minimize simple carbs. Increase exercise as tolerated.

## 2018-07-25 ENCOUNTER — Telehealth: Payer: Self-pay | Admitting: Family Medicine

## 2018-07-25 ENCOUNTER — Emergency Department (HOSPITAL_BASED_OUTPATIENT_CLINIC_OR_DEPARTMENT_OTHER)
Admission: EM | Admit: 2018-07-25 | Discharge: 2018-07-25 | Disposition: A | Discharge status: Home / Self Care | Payer: Medicare HMO | Attending: Emergency Medicine | Admitting: Emergency Medicine

## 2018-07-25 ENCOUNTER — Other Ambulatory Visit: Payer: Self-pay

## 2018-07-25 ENCOUNTER — Encounter (HOSPITAL_BASED_OUTPATIENT_CLINIC_OR_DEPARTMENT_OTHER): Payer: Self-pay | Admitting: *Deleted

## 2018-07-25 ENCOUNTER — Emergency Department (HOSPITAL_BASED_OUTPATIENT_CLINIC_OR_DEPARTMENT_OTHER): Payer: Medicare HMO

## 2018-07-25 DIAGNOSIS — E1140 Type 2 diabetes mellitus with diabetic neuropathy, unspecified: Secondary | ICD-10-CM | POA: Diagnosis not present

## 2018-07-25 DIAGNOSIS — N12 Tubulo-interstitial nephritis, not specified as acute or chronic: Secondary | ICD-10-CM | POA: Diagnosis not present

## 2018-07-25 DIAGNOSIS — I2510 Atherosclerotic heart disease of native coronary artery without angina pectoris: Secondary | ICD-10-CM | POA: Insufficient documentation

## 2018-07-25 DIAGNOSIS — Z794 Long term (current) use of insulin: Secondary | ICD-10-CM | POA: Diagnosis not present

## 2018-07-25 DIAGNOSIS — K5790 Diverticulosis of intestine, part unspecified, without perforation or abscess without bleeding: Secondary | ICD-10-CM | POA: Diagnosis not present

## 2018-07-25 DIAGNOSIS — I130 Hypertensive heart and chronic kidney disease with heart failure and stage 1 through stage 4 chronic kidney disease, or unspecified chronic kidney disease: Secondary | ICD-10-CM | POA: Diagnosis not present

## 2018-07-25 DIAGNOSIS — R079 Chest pain, unspecified: Secondary | ICD-10-CM | POA: Diagnosis not present

## 2018-07-25 DIAGNOSIS — Z7982 Long term (current) use of aspirin: Secondary | ICD-10-CM | POA: Diagnosis not present

## 2018-07-25 DIAGNOSIS — N10 Acute tubulo-interstitial nephritis: Secondary | ICD-10-CM | POA: Insufficient documentation

## 2018-07-25 DIAGNOSIS — R1030 Lower abdominal pain, unspecified: Secondary | ICD-10-CM | POA: Diagnosis present

## 2018-07-25 DIAGNOSIS — Z87891 Personal history of nicotine dependence: Secondary | ICD-10-CM | POA: Insufficient documentation

## 2018-07-25 DIAGNOSIS — I252 Old myocardial infarction: Secondary | ICD-10-CM | POA: Insufficient documentation

## 2018-07-25 DIAGNOSIS — Z79899 Other long term (current) drug therapy: Secondary | ICD-10-CM | POA: Diagnosis not present

## 2018-07-25 DIAGNOSIS — N183 Chronic kidney disease, stage 3 (moderate): Secondary | ICD-10-CM | POA: Diagnosis not present

## 2018-07-25 DIAGNOSIS — E785 Hyperlipidemia, unspecified: Secondary | ICD-10-CM | POA: Diagnosis not present

## 2018-07-25 DIAGNOSIS — I5032 Chronic diastolic (congestive) heart failure: Secondary | ICD-10-CM | POA: Diagnosis not present

## 2018-07-25 LAB — I-STAT CG4 LACTIC ACID, ED: Lactic Acid, Venous: 1.07 mmol/L (ref 0.5–1.9)

## 2018-07-25 LAB — COMPREHENSIVE METABOLIC PANEL
ALK PHOS: 38 U/L (ref 38–126)
ALT: 23 U/L (ref 0–44)
ANION GAP: 8 (ref 5–15)
AST: 29 U/L (ref 15–41)
Albumin: 4 g/dL (ref 3.5–5.0)
BUN: 29 mg/dL — ABNORMAL HIGH (ref 8–23)
CO2: 27 mmol/L (ref 22–32)
CREATININE: 1.64 mg/dL — AB (ref 0.61–1.24)
Calcium: 10 mg/dL (ref 8.9–10.3)
Chloride: 102 mmol/L (ref 98–111)
GFR, EST AFRICAN AMERICAN: 45 mL/min — AB (ref >60)
GFR, EST NON AFRICAN AMERICAN: 38 mL/min — AB (ref >60)
GLUCOSE: 99 mg/dL (ref 70–99)
Potassium: 3.4 mmol/L — ABNORMAL LOW (ref 3.5–5.1)
SODIUM: 137 mmol/L (ref 135–145)
Total Bilirubin: 0.7 mg/dL (ref 0.3–1.2)
Total Protein: 7.2 g/dL (ref 6.5–8.1)

## 2018-07-25 LAB — CBC WITH DIFFERENTIAL/PLATELET
ABS IMMATURE GRANULOCYTES: 0.06 10*3/uL (ref 0.00–0.07)
Basophils Absolute: 0 10*3/uL (ref 0.0–0.1)
Basophils Relative: 0 %
EOS PCT: 0 %
Eosinophils Absolute: 0.1 10*3/uL (ref 0.0–0.5)
HCT: 37 % — ABNORMAL LOW (ref 39.0–52.0)
Hemoglobin: 12.3 g/dL — ABNORMAL LOW (ref 13.0–17.0)
Immature Granulocytes: 1 %
Lymphocytes Relative: 19 %
Lymphs Abs: 2.4 10*3/uL (ref 0.7–4.0)
MCH: 32.3 pg (ref 26.0–34.0)
MCHC: 33.2 g/dL (ref 30.0–36.0)
MCV: 97.1 fL (ref 80.0–100.0)
MONO ABS: 1 10*3/uL (ref 0.1–1.0)
MONOS PCT: 7 %
NEUTROS ABS: 9.4 10*3/uL — AB (ref 1.7–7.7)
Neutrophils Relative %: 73 %
Platelets: 172 10*3/uL (ref 150–400)
RBC: 3.81 MIL/uL — AB (ref 4.22–5.81)
RDW: 14.9 % (ref 11.5–15.5)
WBC: 13 10*3/uL — AB (ref 4.0–10.5)
nRBC: 0 % (ref 0.0–0.2)

## 2018-07-25 LAB — URINALYSIS, ROUTINE W REFLEX MICROSCOPIC
Bilirubin Urine: NEGATIVE
Glucose, UA: NEGATIVE mg/dL
HGB URINE DIPSTICK: NEGATIVE
KETONES UR: NEGATIVE mg/dL
Nitrite: NEGATIVE
PROTEIN: NEGATIVE mg/dL
Specific Gravity, Urine: 1.02 (ref 1.005–1.030)
pH: 5.5 (ref 5.0–8.0)

## 2018-07-25 LAB — TROPONIN I: Troponin I: <0.03 ng/mL (ref <0.03)

## 2018-07-25 LAB — URINALYSIS, MICROSCOPIC (REFLEX)

## 2018-07-25 LAB — LIPASE, BLOOD: Lipase: 33 U/L (ref 11–51)

## 2018-07-25 MED ORDER — SODIUM CHLORIDE 0.9 % IV SOLN
1.0000 g | Freq: Once | INTRAVENOUS | Status: AC
  Administered 2018-07-25: 1 g via INTRAVENOUS
  Filled 2018-07-25: qty 10

## 2018-07-25 MED ORDER — ONDANSETRON HCL 4 MG/2ML IJ SOLN
4.0000 mg | Freq: Once | INTRAMUSCULAR | Status: AC
  Administered 2018-07-25: 4 mg via INTRAVENOUS
  Filled 2018-07-25: qty 2

## 2018-07-25 MED ORDER — CEPHALEXIN 500 MG PO CAPS: 500.0000 mg | ORAL_CAPSULE | Freq: Four times a day (QID) | ORAL | 0 refills | Status: AC

## 2018-07-25 MED ORDER — IOPAMIDOL (ISOVUE-300) INJECTION 61%
100.0000 mL | Freq: Once | INTRAVENOUS | Status: AC | PRN
  Administered 2018-07-25: 80 mL via INTRAVENOUS

## 2018-07-25 MED ORDER — HYDROMORPHONE HCL 1 MG/ML IJ SOLN
1.0000 mg | Freq: Once | INTRAMUSCULAR | Status: AC
  Administered 2018-07-25: 1 mg via INTRAVENOUS
  Filled 2018-07-25: qty 1

## 2018-07-25 NOTE — ED Provider Notes (Signed)
[facility] EMERGENCY DEPARTMENT Provider Note   CSN: [id number] Arrival date & time: 07/25/18  1700     History   Chief Complaint Chief Complaint  Patient presents with  . Back Pain    HPI William Hendrix is a 77 y.o. male.  HPI   Patient is a 77 year old male with a history of AAA, CAD s/p multiple stents, CHF, chronic lower back pain s/p lumbar 2-3, 3-4, 4-5 fusion, nephrolithiasis, hypertension, hyperlipidemia, T2DM, CKD, who presents the emergency department today for evaluation of bilateral lower abdominal pain that radiates to the bilateral flanks which has been ongoing for the last week.  Pain is constant and severe.  Worse when sitting up and with palpation.  Is associated with nausea but no vomiting.  No diarrhea or constipation.  Last BM was yesterday.  No loss of control of bowel function.    States he has been having some dysuria, frequency, urgency and urge incontinence.  Has chronic peripheral neuropathy, that he states is unchanged today.  Has been ambulatory at home but has had significant pain.  Denies saddle anesthesia.  No fevers or chills.  Had an episode of midsternal chest pain that occurred while he was using the restroom earlier today.  Was associated with shortness of breath and nausea.  States he took a NTG and sxs resolved. States that pain was very mild and he thinks it may have been because of gas.   Has been ambulatory at home  Past Medical History:  Diagnosis Date  . AAA (abdominal aortic aneurysm) ([place]) 12/15/2014   a. Mild aneurysmal dilatation of the infrarenal abdominal aorta 3.2 cm - f/u due by 2019.  . Arthritis    "all over" (07/30/2016)  . CAD (coronary artery disease)    a. stent to LAD 2000. b. possible spasm by cath 2001. c. IVUS/PTCA/DES to mLAD 05/2013. d. PTCA/DES of dRCA into ostial rPDA 07/2013. e. PTCA of OM2, DES to Cx, DES to [facility] 07/2016.  Marland Kitchen Chronic bronchitis ([place])   . Chronic diastolic CHF (congestive heart failure)  ([place])   . Chronic lower back pain   . Chronic neck pain   . CKD (chronic kidney disease), stage III ([place])   . Diabetic peripheral neuropathy ([place])   . Ejection fraction    55%, 07/2010, mild inferior hypo  . GERD (gastroesophageal reflux disease)   . Gout   . H/O diverticulitis of colon 01/31/2015  . H/O hiatal hernia   . History of blood transfusion ~ 10/1940   "had pneumonia"  . History of kidney stones   . HTN (hypertension)   . Hyperlipidemia   . Melanoma of forearm, left ([place]) 02/2011   with wide excision   . Myocardial infarction ([place])   . Nephrolithiasis   . Obesity   . OSA on CPAP     [name] since 2000  . Peripheral neuropathy    both feet  . Positive D-dimer    a. significant elevation ,hospital 07/2010, etiology unclear. b. D Dimer chronically > 20.  Marland Kitchen Renal insufficiency   . Skin cancer   . Spinal stenosis    a. s/p surgical repair 2013  . Spinal stenosis of lumbar region   . Type II diabetes mellitus ([place])   . Ventral hernia     Patient Active Problem List   Diagnosis Date Noted  . Muscle cramps 06/13/2018  . Urinary incontinence 05/01/2018  . Anemia 11/07/2017  . Constipation 11/07/2017  . Lumbar stenosis with  neurogenic claudication 10/22/2017  . Back pain 08/29/2017  . Right-sided low back pain with right-sided sciatica 11/22/2016  . Pressure injury of skin 08/03/2016  . Dyspnea 08/01/2016  . NSTEMI (non-ST elevated myocardial infarction) ([place]) 07/30/2016  . CAD in native artery   . Obesity   . Uncontrolled type 2 diabetes mellitus with circulatory disorder, with long-term current use of insulin ([place]) 11/28/2015  . Lumbar radiculopathy 10/09/2015  . Neuropathy, diabetic ([place]) 05/22/2015  . H/O diverticulitis of colon 01/31/2015  . AAA (abdominal aortic aneurysm) ([place]) 01/01/2015  . Sinusitis 10/10/2014  . Right hip pain 07/05/2014  . Palpitations 02/27/2014  . Claudication of lower extremity ([place]) 10/25/2013  . BPH with obstruction/lower  urinary tract symptoms 10/25/2013  . Hypertriglyceridemia 08/15/2012  . Melanoma ([place])   . Dizziness   . Renal insufficiency   . Coronary artery disease with exertional angina ([place])   . Hyperlipidemia   . Essential hypertension   . OSA (obstructive sleep apnea)   . Ejection fraction   . SOB (shortness of breath)   . PARESTHESIA 05/30/2009  . EDEMA 01/22/2009  . OTHER ABNORMAL BLOOD CHEMISTRY 04/25/2008  . Gout 10/21/2007  . DEPRESSION 10/21/2007  . VENTRAL HERNIA 10/21/2007  . HIATAL HERNIA 10/21/2007  . FATIGUE 10/21/2007  . SKIN CANCER, HX OF 10/21/2007  . NEPHROLITHIASIS, HX OF 10/21/2007    Past Surgical History:  Procedure Laterality Date  . ANTERIOR LAT LUMBAR FUSION  10/22/2017   Procedure: Lumbar two-three Lumbar three-four Lumbar four-five Anteriolateral lumbar interbody fusion with percutaneous pedicle screw fixation and infuse;  Surgeon: [name], MD;  Location: [facility];  Service: Neurosurgery;;  . APPLICATION OF ROBOTIC ASSISTANCE FOR SPINAL PROCEDURE  10/22/2017   Procedure: APPLICATION OF ROBOTIC ASSISTANCE FOR SPINAL PROCEDURE;  Surgeon: [name], MD;  Location: [facility];  Service: Neurosurgery;;  . BACK SURGERY    . CARDIAC CATHETERIZATION N/A 07/30/2016   Procedure: Left Heart Cath and Coronary Angiography;  Surgeon: Nelva Bush, MD;  Location: [facility] CV LAB;  Service: Cardiovascular;  Laterality: N/A;  . CARDIAC CATHETERIZATION N/A 07/30/2016   Procedure: Coronary Stent Intervention;  Surgeon: Nelva Bush, MD;  Location: [facility] CV LAB;  Service: Cardiovascular;  Laterality: N/A;  Mid CFX and MID RCA  . CARDIAC CATHETERIZATION N/A 07/30/2016   Procedure: Coronary Balloon Angioplasty;  Surgeon: Nelva Bush, MD;  Location: [facility] CV LAB;  Service: Cardiovascular;  Laterality: N/A;  OM 1  . CARPAL TUNNEL RELEASE Left   . CATARACT EXTRACTION W/ INTRAOCULAR LENS  IMPLANT, BILATERAL Bilateral ~ 2014  . CERVICAL DISC SURGERY  1990s  .  CORONARY ANGIOPLASTY WITH STENT PLACEMENT  05/2013  . CORONARY ANGIOPLASTY WITH STENT PLACEMENT  07/26/2013   DES to RCA extending to PDA    . CORONARY ANGIOPLASTY WITH STENT PLACEMENT  07/30/2016  . CORONARY ANGIOPLASTY WITH STENT PLACEMENT  2000   CAD  . DENTAL SURGERY  04/2016   "got infected; had to dig it out"  . EYE SURGERY    . KNEE ARTHROSCOPY Right 1990's   rt  . LEFT AND RIGHT HEART CATHETERIZATION WITH CORONARY ANGIOGRAM N/A 07/26/2013   Procedure: LEFT AND RIGHT HEART CATHETERIZATION WITH CORONARY ANGIOGRAM;  Surgeon: [name], MD;  Location: [facility] CATH LAB;  Service: Cardiovascular;  Laterality: N/A;  . LEFT HEART CATH AND CORONARY ANGIOGRAPHY N/A 02/26/2017   Procedure: Left Heart Cath and Coronary Angiography;  Surgeon: Sherren Mocha, MD;  Location: [facility] CV LAB;  Service: Cardiovascular;  Laterality: N/A;  . LEFT HEART CATHETERIZATION WITH CORONARY ANGIOGRAM N/A 05/19/2013   Procedure: LEFT HEART CATHETERIZATION WITH CORONARY ANGIOGRAM;  Surgeon: [name], MD;  Location: [facility] CATH LAB;  Service: Cardiovascular;  Laterality: N/A;  . LUMBAR LAMINECTOMY/DECOMPRESSION MICRODISCECTOMY  08/30/2012   Procedure: LUMBAR LAMINECTOMY/DECOMPRESSION MICRODISCECTOMY 2 LEVELS;  Surgeon: [name], MD;  Location: [facility] NEURO ORS;  Service: Neurosurgery;  Laterality: Bilateral;  Bilateral Lumbar three-four Lumbar four-five Laminotomies  . LUMBAR PERCUTANEOUS PEDICLE SCREW 1 LEVEL Bilateral 10/22/2017   Procedure: LUMBAR PERCUTANEOUS PEDICLE SCREW 1 LEVEL;  Surgeon: [name], MD;  Location: [facility];  Service: Neurosurgery;  Laterality: Bilateral;  . LUMBAR SPINE SURGERY  04/07/2016   Dr. Ellene Route; "?ruptured disc"  . MELANOMA EXCISION Left 02/2011   forearm  . NASAL SINUS SURGERY  1970s   "cut windows in sinus pockets"  . PERCUTANEOUS CORONARY STENT INTERVENTION (PCI-S)  05/19/2013   Procedure: PERCUTANEOUS CORONARY STENT INTERVENTION (PCI-S);  Surgeon: [name], MD;   Location: [facility] CATH LAB;  Service: Cardiovascular;;  . ULNAR TUNNEL RELEASE Left 04/07/2016   Dr. Ellene Route        Home Medications    Prior to Admission medications   Medication Sig Start Date End Date Taking? Authorizing Provider  acetaminophen (TYLENOL) 500 MG tablet Take 1,000 mg by mouth 2 (two) times daily.    [provider]  albuterol (PROVENTIL HFA;VENTOLIN HFA) 108 (90 Base) MCG/ACT inhaler Inhale 2 puffs into the lungs every 6 (six) hours as needed for wheezing or shortness of breath. 10/30/16   Saguier, Percell Miller, PA-C  allopurinol (ZYLOPRIM) 300 MG tablet Take 1 tablet (300 mg total) by mouth daily. 05/19/18   [name], MD  aspirin EC 81 MG tablet Take 81 mg by mouth daily. 02/23/11   [name], MD  atorvastatin (LIPITOR) 40 MG tablet Take 1 tablet (40 mg total) by mouth daily. 01/24/18   [name], MD  B Complex Vitamins (B COMPLEX-B12 PO) Take 1 tablet by mouth at bedtime. 07/23/16   [provider]  BD INSULIN SYRINGE ULTRAFINE 31G X 5/16" 1 ML MISC USE  TO INJECT  INSULIN  FIVE  TIMES  DAILY AS INSTRUCTED 03/17/17   [name], MD  cephALEXin (KEFLEX) 500 MG capsule Take 1 capsule (500 mg total) by mouth 4 (four) times daily for 14 days. 07/25/18 08/08/18  Gaylin Bulthuis S, PA-C  chlorpheniramine (CHLOR-TRIMETON) 4 MG tablet Take 4 mg by mouth daily.    [provider]  Cholecalciferol (VITAMIN D) 1000 UNITS capsule Take 1,000 Units by mouth at bedtime.     [provider]  fenofibrate 160 MG tablet Take 1 tablet (160 mg total) by mouth at bedtime. 01/24/18   [name], MD  fluocinonide cream (LIDEX) 2.99 % Apply 1 application topically daily as needed (for rash).  08/11/12   [provider]  fluticasone (FLONASE) 50 MCG/ACT nasal spray Place 2 sprays into both nostrils daily. 08/11/17   Saguier, Percell Miller, PA-C  furosemide (LASIX) 40 MG tablet TAKE 1 AND 1/2 TABLETS EVERY DAY Patient taking differently: Take 60 mg  by mouth every day 07/12/17   Lelon Perla, MD  HYDROcodone-acetaminophen (NORCO/VICODIN) 5-325 MG tablet Take 1-2 tablets by mouth every 4 (four) hours as needed for severe pain ((score 7 to 10)). 10/25/17   [name], MD  insulin NPH Human (NOVOLIN N) 100 UNIT/ML injection Inject 45 units in am and 40 in the evening 05/09/18   Gherghe,  Salena Saner, MD  insulin regular (NOVOLIN R RELION) 100 units/mL injection Inject 0.25-0.3 mLs (25-30 Units total) into the skin 3 (three) times daily before meals. Patient taking differently: Inject 35 Units into the skin 3 (three) times daily before meals.  09/17/17   [name], MD  isosorbide mononitrate (IMDUR) 30 MG 24 hr tablet Take 1 tablet (30 mg total) by mouth daily. 05/13/18   Lelon Perla, MD  metoprolol tartrate (LOPRESSOR) 50 MG tablet Take 1 tablet (50 mg total) by mouth 2 (two) times daily. 01/24/18   [name], MD  mupirocin ointment Drue Stager) 2 % Apply to area thin film twice daily 09/29/17   Saguier, Percell Miller, PA-C  nitroGLYCERIN (NITROSTAT) 0.4 MG SL tablet Place 1 tablet (0.4 mg total) under the tongue every 5 (five) minutes as needed for chest pain. 02/16/18   Lelon Perla, MD  pantoprazole (PROTONIX) 20 MG tablet TAKE 1 TABLET (20 MG TOTAL) BY MOUTH DAILY. 12/30/17   [name], MD  Probiotic Product (PROBIOTIC PO) Take 1 capsule by mouth daily.    [provider]  tamsulosin (FLOMAX) 0.4 MG CAPS capsule Take 1 capsule (0.4 mg total) by mouth daily. 01/24/18   [name], MD  tiZANidine (ZANAFLEX) 4 MG capsule Take 1 capsule (4 mg total) by mouth 4 (four) times daily as needed for muscle spasms. Take 1 tablet every 6 hours as needed; not to exceed 3 doses in 24 hours. 02/09/17   [name], MD  TRUE METRIX BLOOD GLUCOSE TEST test strip USE TO TEST BLOOD SUGAR THREE TIMES DAILY AS DIRECTED 04/29/17   [name], MD    Family History Family History  Problem Relation Age of Onset  . Cancer Mother          intestinal   . Heart disease Mother   . Cancer Father        prostate  . Migraines Daughter   . Leukemia Sister   . Heart disease Sister   . Prostate cancer Unknown   . Kidney cancer Unknown   . Cancer Unknown        Bladder cancer  . Coronary artery disease Unknown   . Hypertension Sister   . Hypertension Brother   . Heart attack Neg Hx   . Stroke Neg Hx     Social History Social History   Tobacco Use  . Smoking status: Former Smoker    Packs/day: 3.00    Years: 30.00    Pack years: 90.00    Types: Cigarettes    Last attempt to quit: 09/17/1996    Years since quitting: 21.8  . Smokeless tobacco: Never Used  Substance Use Topics  . Alcohol use: No  . Drug use: No     Allergies   Levemir [insulin detemir]; Codeine; Lipitor [atorvastatin]; Novolog mix [insulin aspart prot & aspart]; and Other   Review of Systems Review of Systems  Constitutional: Negative for chills and fever.  HENT: Negative for ear pain and sore throat.   Eyes: Negative for pain and visual disturbance.  Respiratory: Positive for shortness of breath (resolved).   Cardiovascular: Positive for chest pain (resolved) and leg swelling. Negative for palpitations.  Gastrointestinal: Positive for abdominal pain and nausea. Negative for blood in stool, constipation, diarrhea and vomiting.       No loss of control of bowel function  Genitourinary: Positive for dysuria, frequency and urgency.       Urge incontinence  Musculoskeletal: Positive for back  pain (chronic).  Skin: Negative for color change and rash.  Neurological: Positive for numbness (2/2 peripheral neuropathy, unchanged). Negative for weakness.  All other systems reviewed and are negative.   Physical Exam Updated Vital Signs BP 135/65   Pulse 80   Temp 99.9 F (37.7 C) (Oral)   Resp 13   Ht 6\' 10"  (2.083 m)   Wt 115.2 kg   SpO2 100%   BMI 26.56 kg/m   Physical Exam  Constitutional: He appears well-developed and  well-nourished.  HENT:  Head: Normocephalic and atraumatic.  Eyes: Conjunctivae are normal.  Neck: Neck supple.  Cardiovascular: Normal rate, regular rhythm and normal heart sounds.  No murmur heard. Pulmonary/Chest: Effort normal and breath sounds normal. No stridor. No respiratory distress. He has no wheezes.  Abdominal: Soft. Bowel sounds are normal.  Obese male. RLQ and LLQ abd pain with involuntary guarding. No rigidity or rebound TTP. Mild CVA TTP bilaterally.   Musculoskeletal:  Trace ble edema. No significant midline lumbar TTP. Mild TTP to bilat lumbar paraspinous muscles with well healed surgical scars.   Neurological: He is alert.  Motor:  Normal tone. 5/5 strength to BLE major muscle groups Sensory: light touch normal in upper portions of BLE, peripheral neuropathy to BLE from knee to feet (unchanged from chronic)  Skin: Skin is warm and dry.  Psychiatric: He has a normal mood and affect.  Nursing note and vitals reviewed.   ED Treatments / Results  Labs (all labs ordered are listed, but only abnormal results are displayed) Labs Reviewed  URINALYSIS, ROUTINE W REFLEX MICROSCOPIC - Abnormal; Notable for the following components:      Result Value   Leukocytes, UA SMALL (*)    All other components within normal limits  URINALYSIS, MICROSCOPIC (REFLEX) - Abnormal; Notable for the following components:   Bacteria, UA MANY (*)    All other components within normal limits  CBC WITH DIFFERENTIAL/PLATELET - Abnormal; Notable for the following components:   WBC 13.0 (*)    RBC 3.81 (*)    Hemoglobin 12.3 (*)    HCT 37.0 (*)    Neutro Abs 9.4 (*)    All other components within normal limits  COMPREHENSIVE METABOLIC PANEL - Abnormal; Notable for the following components:   Potassium 3.4 (*)    BUN 29 (*)    Creatinine, Ser 1.64 (*)    GFR calc non Af Amer 38 (*)    GFR calc Af Amer 45 (*)    All other components within normal limits  URINE CULTURE  CULTURE, BLOOD  (ROUTINE X 2)  CULTURE, BLOOD (ROUTINE X 2)  LIPASE, BLOOD  TROPONIN I  I-STAT CG4 LACTIC ACID, ED  I-STAT CG4 LACTIC ACID, ED    EKG EKG Interpretation  Date/Time:  Monday July 25 2018 17:57:47 EDT Ventricular Rate:  77 PR Interval:    QRS Duration: 99 QT Interval:  382 QTC Calculation: 433 R Axis:   -51 Text Interpretation:  Sinus rhythm Left anterior fascicular block Abnormal R-wave progression, early transition No significant change since last tracing Confirmed by Blanchie Dessert [phone]) on 07/25/2018 6:19:08 PM   Radiology Dg Chest 2 View  Result Date: 07/25/2018 CLINICAL DATA:  Low back pain and chest pain EXAM: CHEST - 2 VIEW COMPARISON:  07/15/2017 FINDINGS: No focal airspace disease or pleural effusion. Mild to moderate cardiomegaly without edema. No pneumothorax. Degenerative changes of the spine. IMPRESSION: No active cardiopulmonary disease.  Cardiomegaly. Electronically Signed   By: Donavan Foil  M.D.   On: 07/25/2018 19:19   Ct Abdomen Pelvis W Contrast  Result Date: 07/25/2018 CLINICAL DATA:  77 year old male with a history bilateral lower back pain and urinary frequency EXAM: CT ABDOMEN AND PELVIS WITH CONTRAST TECHNIQUE: Multidetector CT imaging of the abdomen and pelvis was performed using the standard protocol following bolus administration of intravenous contrast. CONTRAST:  52mL ISOVUE-300 IOPAMIDOL (ISOVUE-300) INJECTION 61% COMPARISON:  04/25/2018, 04/23/2018 FINDINGS: Lower chest: No acute abnormality. Calcifications/stenting of the coronary arteries. Hepatobiliary: Diffusely decreased attenuation of liver parenchyma. Small calcifications within the gallbladder fundus compatible with cholelithiasis. No associated inflammatory changes. Pancreas: Atrophic pancreas parenchyma. Coarse calcifications in the pancreatic head, similar to prior. No inflammatory changes. Spleen: Unremarkable spleen Adrenals/Urinary Tract: Unremarkable appearance of the adrenal glands.  No evidence of hydronephrosis of the right or left kidney. No nephrolithiasis. Unremarkable course of the bilateral ureters. Unremarkable appearance of the urinary bladder. Stomach/Bowel: Unremarkable stomach. Small hiatal hernia. Unremarkable appearance of the small bowel. Normal appendix. Colonic diverticula without evidence of associated inflammation. Vascular/Lymphatic: Vascular calcifications. Unchanged diameter measuring 3 cm of the infrarenal abdominal aorta. No periaortic fluid or inflammatory changes. No lymphadenopathy. Reproductive: Calcifications of the prostate. Transverse diameter of the prostate measures 5.2 cm. Other: Laxity of the ventral abdominal wall musculature, with no herniation. Small fat containing inguinal hernias. Musculoskeletal: Surgical changes of posterior lumbar interbody fusion with bilateral pedicle screw and rod fixation of L2-L5. No bony canal narrowing. No acute fracture. IMPRESSION: No acute CT finding. Colonic diverticular disease without evidence of acute diverticulitis. Abdominal aorta measures 3 cm. Recommend followup by ultrasound in 3 years. This recommendation follows ACR consensus guidelines: White Paper of the ACR Incidental Findings Committee II on Vascular Findings. J Am Coll Radiol 2013; 10:789-794 Aortic Atherosclerosis (ICD10-I70.0). Electronically Signed   By: Corrie Mckusick D.O.   On: 07/25/2018 20:08    Procedures Procedures (including critical care time)  Medications Ordered in ED Medications  HYDROmorphone (DILAUDID) injection 1 mg (1 mg Intravenous Given 07/25/18 1821)  ondansetron (ZOFRAN) injection 4 mg (4 mg Intravenous Given 07/25/18 1820)  cefTRIAXone (ROCEPHIN) 1 g in sodium chloride 0.9 % 100 mL IVPB (1 g Intravenous New Bag/Given 07/25/18 1843)  iopamidol (ISOVUE-300) 61 % injection 100 mL (80 mLs Intravenous Contrast Given 07/25/18 1915)     Initial Impression / Assessment and Plan / ED Course  I have reviewed the triage vital signs  and the nursing notes.  Pertinent labs & imaging results that were available during my care of the patient were reviewed by me and considered in my medical decision making (see chart for details).     Patient seen in conjunction with Dr. Maryan Rued who agrees with the current work-up and plan.  Final Clinical Impressions(s) / ED Diagnoses   Final diagnoses:  Pyelonephritis   Patient presenting the ED today with bilateral lower abdominal pain radiating to the bilateral flanks.  History of kidney stones and AAA.  Also with dysuria frequency and urgency.  Borderline febrile here but otherwise vitals stable.  Has tenderness to the bilateral lower abdomen with involuntary guarding.  No rigidity or rebound tenderness present.  Also mentioned short several second episode of chest pain for which he took nitroglycerin, this is since resolved.  States this episode was mild and he thought it could have been due to gas or low blood sugar.  Low suspicion for dissection given no crushing or persistent chest pain.  Distal pulses are symmetric and vitals are reassuring. Will obtain lab work and  imaging of the abdomen.  CBC with mild leukocytosis at 13, also with decrease in hemoglobin to 12.3.  Lower than 3 months ago, but not grossly abnormal from patient's baseline.  CMP with mild hypokalemia.  Elevated BUN and creatinine to 29 and 1.64 respectively, consistent with prior.  Lipase is negative.  UA suggests urinary tract infection with leukocytes, 11-20 white blood cells and many bacteria.  Will send for culture and treat with Rocephin.  Chest x-ray with cardiomegaly but no active disease present.  No widened mediastinum or evidence of pneumothorax. EKG with normal sinus rhythm, heart rate 77.  Left into regular fascicular block and abnormal R wave progression noted, but no significant change since last changed seeing.   CT abd/pelvis for acute intra-abdominal pathology.  No kidney stones noted.  Atherosclerosis  and abdominal aortic aneurysm are chronic and stable.  Discussed findings and likely diagnosis of pyelonephritis with pt and wife at bedside.  Discussed option of admission for IV antibiotics and further monitoring versus outpatient therapy with p.o. antibiotics.  Patient and wife prefer p.o. antibiotics and close monitoring.  Return precautions discussed for any new or worsening symptoms and they voiced an understanding of the plan and reasons to return immediately to the ED.  All questions were answered and patient stable for discharge.  ED Discharge Orders         Ordered    cephALEXin (KEFLEX) 500 MG capsule  4 times daily     07/25/18 2028           Rodney Booze, PA-C 07/25/18 2029    Blanchie Dessert, MD 07/25/18 2359

## 2018-07-25 NOTE — ED Notes (Signed)
PT given diet coke to PO challenge

## 2018-07-25 NOTE — ED Notes (Addendum)
Pt's oxygen saturation dropped following the dose of dilaudid, placed on 2L nasal canula, EDP aware

## 2018-07-25 NOTE — Discharge Instructions (Addendum)
You were given a prescription for antibiotics. Please take the antibiotic prescription fully.   You need to make an appointment to follow-up with your regular doctor later this week or early next week.  You need to return to the emergency department for any new or worsening symptoms including fevers, chills, vomiting, worsening pain.

## 2018-07-25 NOTE — ED Triage Notes (Addendum)
pt c/obil lower back pain which radiates around to both sides and abd x 3 days also c/o urinary freq

## 2018-07-25 NOTE — Telephone Encounter (Signed)
Copied from [place] [id number]. Topic: General - Inquiry >> Jul 25, 2018 12:28 PM Oliver Pila B wrote: Reason for CRM: pt's wife called b/c the pt is having extreme pain in the back/kidney area as well is urinating a lot (constant); pt's wife would like to received advisement from pt's pcp and/or nurse; contact to advise

## 2018-07-26 NOTE — Telephone Encounter (Signed)
Sounds like he may have a UTI. Please have him come to lab for UA with c and s, cbc with diff, cmp and sed rate. If that is all normal and he continues to have pain he will need to be seen. If any of that is positive for UTI especially we will treat. Order for flank pain, urinary frequency

## 2018-07-26 NOTE — Telephone Encounter (Signed)
Please advise 

## 2018-07-27 NOTE — Telephone Encounter (Signed)
Author phoned pt. to relay Dr. Frederik Hendrix message. William Hendrix stated she took him to ER yesterday, discharged on po keflex for dx: pyleonephritis. Wife states he started his keflex yesterday, and William Hendrix advised wife to monitor for confusion and encourage pt. to drink non-caffeinated drinks. Wife knows to call if symptoms do not improve or worsen. F/U appointment with Dr. Charlett Hendrix made for next Monday 10/21.

## 2018-07-28 LAB — URINE CULTURE

## 2018-07-29 ENCOUNTER — Telehealth: Payer: Self-pay

## 2018-07-29 NOTE — Telephone Encounter (Signed)
Post ED Visit - Positive Culture Follow-up  Culture report reviewed by antimicrobial stewardship pharmacist:  []  Elenor Quinones, Pharm.D. []  Heide Guile, Pharm.D., BCPS AQ-ID []  Parks Neptune, Pharm.D., BCPS []  Alycia Rossetti, Pharm.D., BCPS []  [place], Pharm.D., BCPS, AAHIVP []  Legrand Como, Pharm.D., BCPS, AAHIVP []  Salome Arnt, PharmD, BCPS []  Johnnette Gourd, PharmD, BCPS []  Hughes Better, PharmD, BCPS []  Leeroy Cha, PharmD Elicia Lamp Pharm D Positive urine culture Treated with Cephalexin, organism sensitive to the same and no further patient follow-up is required at this time.  Genia Del 07/29/2018, 10:32 AM

## 2018-07-30 LAB — CULTURE, BLOOD (ROUTINE X 2)
CULTURE: NO GROWTH
CULTURE: NO GROWTH
Special Requests: ADEQUATE

## 2018-08-01 ENCOUNTER — Telehealth: Payer: Self-pay | Admitting: Internal Medicine

## 2018-08-01 ENCOUNTER — Encounter: Payer: Self-pay | Admitting: Family Medicine

## 2018-08-01 ENCOUNTER — Inpatient Hospital Stay: Payer: Medicare HMO | Admitting: Family Medicine

## 2018-08-01 ENCOUNTER — Ambulatory Visit (INDEPENDENT_AMBULATORY_CARE_PROVIDER_SITE_OTHER): Payer: Medicare HMO | Admitting: Family Medicine

## 2018-08-01 VITALS — BP 122/62 | HR 64 | Temp 97.8°F | Resp 18 | Wt 251.0 lb

## 2018-08-01 DIAGNOSIS — Z794 Long term (current) use of insulin: Secondary | ICD-10-CM

## 2018-08-01 DIAGNOSIS — Z23 Encounter for immunization: Secondary | ICD-10-CM

## 2018-08-01 DIAGNOSIS — E1165 Type 2 diabetes mellitus with hyperglycemia: Secondary | ICD-10-CM | POA: Diagnosis not present

## 2018-08-01 DIAGNOSIS — R609 Edema, unspecified: Secondary | ICD-10-CM | POA: Diagnosis not present

## 2018-08-01 DIAGNOSIS — E1159 Type 2 diabetes mellitus with other circulatory complications: Secondary | ICD-10-CM

## 2018-08-01 DIAGNOSIS — I10 Essential (primary) hypertension: Secondary | ICD-10-CM

## 2018-08-01 DIAGNOSIS — N289 Disorder of kidney and ureter, unspecified: Secondary | ICD-10-CM | POA: Diagnosis not present

## 2018-08-01 DIAGNOSIS — R252 Cramp and spasm: Secondary | ICD-10-CM

## 2018-08-01 DIAGNOSIS — N390 Urinary tract infection, site not specified: Secondary | ICD-10-CM | POA: Diagnosis not present

## 2018-08-01 DIAGNOSIS — IMO0002 Reserved for concepts with insufficient information to code with codable children: Secondary | ICD-10-CM

## 2018-08-01 LAB — CBC WITH DIFFERENTIAL/PLATELET
BASOS PCT: 0.6 % (ref 0.0–3.0)
Basophils Absolute: 0 10*3/uL (ref 0.0–0.1)
EOS PCT: 2.6 % (ref 0.0–5.0)
Eosinophils Absolute: 0.2 10*3/uL (ref 0.0–0.7)
HCT: 37.1 % — ABNORMAL LOW (ref 39.0–52.0)
Hemoglobin: 12.6 g/dL — ABNORMAL LOW (ref 13.0–17.0)
LYMPHS ABS: 2 10*3/uL (ref 0.7–4.0)
Lymphocytes Relative: 32.3 % (ref 12.0–46.0)
MCHC: 33.9 g/dL (ref 30.0–36.0)
MCV: 94.4 fl (ref 78.0–100.0)
MONOS PCT: 7.3 % (ref 3.0–12.0)
Monocytes Absolute: 0.4 10*3/uL (ref 0.1–1.0)
NEUTROS ABS: 3.5 10*3/uL (ref 1.4–7.7)
Neutrophils Relative %: 57.2 % (ref 43.0–77.0)
PLATELETS: 296 10*3/uL (ref 150.0–400.0)
RBC: 3.93 Mil/uL — ABNORMAL LOW (ref 4.22–5.81)
RDW: 14.6 % (ref 11.5–15.5)
WBC: 6.2 10*3/uL (ref 4.0–10.5)

## 2018-08-01 LAB — COMPREHENSIVE METABOLIC PANEL
ALT: 15 U/L (ref 0–53)
AST: 15 U/L (ref 0–37)
Albumin: 3.8 g/dL (ref 3.5–5.2)
Alkaline Phosphatase: 45 U/L (ref 39–117)
BILIRUBIN TOTAL: 0.4 mg/dL (ref 0.2–1.2)
BUN: 31 mg/dL — ABNORMAL HIGH (ref 6–23)
CALCIUM: 10.1 mg/dL (ref 8.4–10.5)
CHLORIDE: 104 meq/L (ref 96–112)
CO2: 31 meq/L (ref 19–32)
Creatinine, Ser: 1.56 mg/dL — ABNORMAL HIGH (ref 0.40–1.50)
GFR: 45.93 mL/min — AB (ref >60.00)
GLUCOSE: 152 mg/dL — AB (ref 70–99)
POTASSIUM: 4.6 meq/L (ref 3.5–5.1)
Sodium: 141 mEq/L (ref 135–145)
Total Protein: 6.5 g/dL (ref 6.0–8.3)

## 2018-08-01 LAB — MAGNESIUM: Magnesium: 2 mg/dL (ref 1.5–2.5)

## 2018-08-01 MED ORDER — FENOFIBRATE 160 MG PO TABS: 160.0000 mg | ORAL_TABLET | Freq: Every day | ORAL | 1 refills | Status: DC

## 2018-08-01 MED ORDER — TAMSULOSIN HCL 0.4 MG PO CAPS: 0.4000 mg | ORAL_CAPSULE | Freq: Every day | ORAL | 1 refills | Status: DC

## 2018-08-01 MED ORDER — METOPROLOL TARTRATE 50 MG PO TABS: 50.0000 mg | ORAL_TABLET | Freq: Two times a day (BID) | ORAL | 1 refills | Status: DC

## 2018-08-01 MED ORDER — ATORVASTATIN CALCIUM 40 MG PO TABS: 40.0000 mg | ORAL_TABLET | Freq: Every day | ORAL | 1 refills | Status: DC

## 2018-08-01 MED ORDER — PANTOPRAZOLE SODIUM 20 MG PO TBEC: 20.0000 mg | DELAYED_RELEASE_TABLET | Freq: Every day | ORAL | 3 refills | Status: DC

## 2018-08-01 MED ORDER — ALLOPURINOL 300 MG PO TABS: 300.0000 mg | ORAL_TABLET | Freq: Every day | ORAL | 1 refills | Status: DC

## 2018-08-01 MED ORDER — GLUCOSE BLOOD VI STRP: ORAL_STRIP | 3 refills | Status: DC

## 2018-08-01 NOTE — Assessment & Plan Note (Signed)
Check cmp 

## 2018-08-01 NOTE — Patient Instructions (Signed)
Ginger caps prior to antibiotics  Urinary Tract Infection, Adult A urinary tract infection (UTI) is an infection of any part of the urinary tract, which includes the kidneys, ureters, bladder, and urethra. These organs make, store, and get rid of urine in the body. UTI can be a bladder infection (cystitis) or kidney infection (pyelonephritis). What are the causes? This infection may be caused by fungi, viruses, or bacteria. Bacteria are the most common cause of UTIs. This condition can also be caused by repeated incomplete emptying of the bladder during urination. What increases the risk? This condition is more likely to develop if:  You ignore your need to urinate or hold urine for long periods of time.  You do not empty your bladder completely during urination.  You wipe back to front after urinating or having a bowel movement, if you are male.  You are uncircumcised, if you are male.  You are constipated.  You have a urinary catheter that stays in place (indwelling).  You have a weak defense (immune) system.  You have a medical condition that affects your bowels, kidneys, or bladder.  You have diabetes.  You take antibiotic medicines frequently or for long periods of time, and the antibiotics no longer work well against certain types of infections (antibiotic resistance).  You take medicines that irritate your urinary tract.  You are exposed to chemicals that irritate your urinary tract.  You are male.  What are the signs or symptoms? Symptoms of this condition include:  Fever.  Frequent urination or passing small amounts of urine frequently.  Needing to urinate urgently.  Pain or burning with urination.  Urine that smells bad or unusual.  Cloudy urine.  Pain in the lower abdomen or back.  Trouble urinating.  Blood in the urine.  Vomiting or being less hungry than normal.  Diarrhea or abdominal pain.  Vaginal discharge, if you are male.  How is  this diagnosed? This condition is diagnosed with a medical history and physical exam. You will also need to provide a urine sample to test your urine. Other tests may be done, including:  Blood tests.  Sexually transmitted disease (STD) testing.  If you have had more than one UTI, a cystoscopy or imaging studies may be done to determine the cause of the infections. How is this treated? Treatment for this condition often includes a combination of two or more of the following:  Antibiotic medicine.  Other medicines to treat less common causes of UTI.  Over-the-counter medicines to treat pain.  Drinking enough water to stay hydrated.  Follow these instructions at home:  Take over-the-counter and prescription medicines only as told by your health care provider.  If you were prescribed an antibiotic, take it as told by your health care provider. Do not stop taking the antibiotic even if you start to feel better.  Avoid alcohol, caffeine, tea, and carbonated beverages. They can irritate your bladder.  Drink enough fluid to keep your urine clear or pale yellow.  Keep all follow-up visits as told by your health care provider. This is important.  Make sure to: ? Empty your bladder often and completely. Do not hold urine for long periods of time. ? Empty your bladder before and after sex. ? Wipe from front to back after a bowel movement if you are male. Use each tissue one time when you wipe. Contact a health care provider if:  You have back pain.  You have a fever.  You feel nauseous or  vomit.  Your symptoms do not get better after 3 days.  Your symptoms go away and then return. Get help right away if:  You have severe back pain or lower abdominal pain.  You are vomiting and cannot keep down any medicines or water. This information is not intended to replace advice given to you by your health care provider. Make sure you discuss any questions you have with your health care  provider. Document Released: 07/08/2005 Document Revised: 03/11/2016 Document Reviewed: 08/19/2015 Elsevier Interactive Patient Education  2018 Elsevier Inc.  

## 2018-08-01 NOTE — Assessment & Plan Note (Signed)
Presented to ED last week with dysuria and increased back and abdominal pain found to have Ecoli UTI and responding to Keflex will repeat urine culture and UA next week once antibiotic complete. If infection persistent may need to consider referral to urology with retreatment.

## 2018-08-01 NOTE — Assessment & Plan Note (Signed)
Tried to decrease Lasix to 1 tab daily but has had 5 pound weight gain, encouraged to increase back to 1.5 tabs and monitor can decrease dose again in future as needed.

## 2018-08-01 NOTE — Assessment & Plan Note (Signed)
Persistent, encouraged togive up artificial sweeteners and hydrate better. H[employer] helps some, check labs

## 2018-08-01 NOTE — Assessment & Plan Note (Signed)
minimize simple carbs. Increase exercise as tolerated. Continue current meds  

## 2018-08-01 NOTE — Assessment & Plan Note (Signed)
Well controlled, no changes to meds. Encouraged heart healthy diet such as the DASH diet and exercise as tolerated.  °

## 2018-08-01 NOTE — Telephone Encounter (Signed)
RX sent

## 2018-08-01 NOTE — Progress Notes (Signed)
Subjective:    Patient ID: William Hendrix, male    DOB: 1941/06/17, 77 y.o.   MRN: [id number]  No chief complaint on file.   HPI Patient is in today for follow up accompanied by his wife.  Last week they had to present to the emergency room with a sudden increase in low back pain and lower abdominal pain.  He was experiencing urinary frequency and dysuria as well.  Urine culture revealed an E. coli UTI.  He is now on Keflex and while his back pain persists it is improved back to his baselin and his abdominal pain is resolved.  Still has a hint of dysuria from time to time but denies any hematuria.  No fevers chills.  No anorexia.  Does endorse fatigue and muscle cramps routinely still since stopping his furosemide to 1 tab daily from 1.5 tabs he is noted an improvement in his incontinence and urinary urgency but unfortunately has also noted a 5 pound weight gain. Denies CP/palp/SOB/HA/congestion/fevers/GI c/o. Taking meds as prescribed  Past Medical History:  Diagnosis Date  . AAA (abdominal aortic aneurysm) ([place]) 12/15/2014   a. Mild aneurysmal dilatation of the infrarenal abdominal aorta 3.2 cm - f/u due by 2019.  . Arthritis    "all over" (07/30/2016)  . CAD (coronary artery disease)    a. stent to LAD 2000. b. possible spasm by cath 2001. c. IVUS/PTCA/DES to mLAD 05/2013. d. PTCA/DES of dRCA into ostial rPDA 07/2013. e. PTCA of OM2, DES to Cx, DES to [facility] 07/2016.  Marland Kitchen Chronic bronchitis ([place])   . Chronic diastolic CHF (congestive heart failure) ([place])   . Chronic lower back pain   . Chronic neck pain   . CKD (chronic kidney disease), stage III ([place])   . Diabetic peripheral neuropathy ([place])   . Ejection fraction    55%, 07/2010, mild inferior hypo  . GERD (gastroesophageal reflux disease)   . Gout   . H/O diverticulitis of colon 01/31/2015  . H/O hiatal hernia   . History of blood transfusion ~ 10/1940   "had pneumonia"  . History of kidney stones   . HTN (hypertension)   .  Hyperlipidemia   . Melanoma of forearm, left ([place]) 02/2011   with wide excision   . Myocardial infarction ([place])   . Nephrolithiasis   . Obesity   . OSA on CPAP     [name] since 2000  . Peripheral neuropathy    both feet  . Positive D-dimer    a. significant elevation ,hospital 07/2010, etiology unclear. b. D Dimer chronically > 20.  Marland Kitchen Renal insufficiency   . Skin cancer   . Spinal stenosis    a. s/p surgical repair 2013  . Spinal stenosis of lumbar region   . Type II diabetes mellitus ([place])   . Ventral hernia     Past Surgical History:  Procedure Laterality Date  . ANTERIOR LAT LUMBAR FUSION  10/22/2017   Procedure: Lumbar two-three Lumbar three-four Lumbar four-five Anteriolateral lumbar interbody fusion with percutaneous pedicle screw fixation and infuse;  Surgeon: [name], MD;  Location: [place];  Service: Neurosurgery;;  . APPLICATION OF ROBOTIC ASSISTANCE FOR SPINAL PROCEDURE  10/22/2017   Procedure: APPLICATION OF ROBOTIC ASSISTANCE FOR SPINAL PROCEDURE;  Surgeon: [name], MD;  Location: [place];  Service: Neurosurgery;;  . BACK SURGERY    . CARDIAC CATHETERIZATION N/A 07/30/2016   Procedure: Left Heart Cath and Coronary Angiography;  Surgeon: Nelva Bush, MD;  Location: [facility]  INVASIVE CV LAB;  Service: Cardiovascular;  Laterality: N/A;  . CARDIAC CATHETERIZATION N/A 07/30/2016   Procedure: Coronary Stent Intervention;  Surgeon: Nelva Bush, MD;  Location: [place] CV LAB;  Service: Cardiovascular;  Laterality: N/A;  Mid CFX and MID RCA  . CARDIAC CATHETERIZATION N/A 07/30/2016   Procedure: Coronary Balloon Angioplasty;  Surgeon: Nelva Bush, MD;  Location: [place] CV LAB;  Service: Cardiovascular;  Laterality: N/A;  OM 1  . CARPAL TUNNEL RELEASE Left   . CATARACT EXTRACTION W/ INTRAOCULAR LENS  IMPLANT, BILATERAL Bilateral ~ 2014  . CERVICAL DISC SURGERY  1990s  . CORONARY ANGIOPLASTY WITH STENT PLACEMENT  05/2013  . CORONARY ANGIOPLASTY WITH STENT  PLACEMENT  07/26/2013   DES to RCA extending to PDA    . CORONARY ANGIOPLASTY WITH STENT PLACEMENT  07/30/2016  . CORONARY ANGIOPLASTY WITH STENT PLACEMENT  2000   CAD  . DENTAL SURGERY  04/2016   "got infected; had to dig it out"  . EYE SURGERY    . KNEE ARTHROSCOPY Right 1990's   rt  . LEFT AND RIGHT HEART CATHETERIZATION WITH CORONARY ANGIOGRAM N/A 07/26/2013   Procedure: LEFT AND RIGHT HEART CATHETERIZATION WITH CORONARY ANGIOGRAM;  Surgeon: [name], MD;  Location: [place] CATH LAB;  Service: Cardiovascular;  Laterality: N/A;  . LEFT HEART CATH AND CORONARY ANGIOGRAPHY N/A 02/26/2017   Procedure: Left Heart Cath and Coronary Angiography;  Surgeon: Sherren Mocha, MD;  Location: [place] CV LAB;  Service: Cardiovascular;  Laterality: N/A;  . LEFT HEART CATHETERIZATION WITH CORONARY ANGIOGRAM N/A 05/19/2013   Procedure: LEFT HEART CATHETERIZATION WITH CORONARY ANGIOGRAM;  Surgeon: [name], MD;  Location: [facility] CATH LAB;  Service: Cardiovascular;  Laterality: N/A;  . LUMBAR LAMINECTOMY/DECOMPRESSION MICRODISCECTOMY  08/30/2012   Procedure: LUMBAR LAMINECTOMY/DECOMPRESSION MICRODISCECTOMY 2 LEVELS;  Surgeon: [name], MD;  Location: [place] NEURO ORS;  Service: Neurosurgery;  Laterality: Bilateral;  Bilateral Lumbar three-four Lumbar four-five Laminotomies  . LUMBAR PERCUTANEOUS PEDICLE SCREW 1 LEVEL Bilateral 10/22/2017   Procedure: LUMBAR PERCUTANEOUS PEDICLE SCREW 1 LEVEL;  Surgeon: [name], MD;  Location: [place];  Service: Neurosurgery;  Laterality: Bilateral;  . LUMBAR SPINE SURGERY  04/07/2016   Dr. Ellene Route; "?ruptured disc"  . MELANOMA EXCISION Left 02/2011   forearm  . NASAL SINUS SURGERY  1970s   "cut windows in sinus pockets"  . PERCUTANEOUS CORONARY STENT INTERVENTION (PCI-S)  05/19/2013   Procedure: PERCUTANEOUS CORONARY STENT INTERVENTION (PCI-S);  Surgeon: [name], MD;  Location: [facility] CATH LAB;  Service: Cardiovascular;;  . ULNAR TUNNEL RELEASE Left  04/07/2016   Dr. Ellene Route    Family History  Problem Relation Age of Onset  . Cancer Mother        intestinal   . Heart disease Mother   . Cancer Father        prostate  . Migraines Daughter   . Leukemia Sister   . Heart disease Sister   . Prostate cancer Unknown   . Kidney cancer Unknown   . Cancer Unknown        Bladder cancer  . Coronary artery disease Unknown   . Hypertension Sister   . Hypertension Brother   . Heart attack Neg Hx   . Stroke Neg Hx     Social History   Socioeconomic History  . Marital status: Married    Spouse name: Not on file  . Number of children: Not on file  . Years of education: Not on file  . Highest  education level: Not on file  Occupational History  . Occupation: [employer]  . Financial resource strain: Not on file  . Food insecurity:    Worry: Not on file    Inability: Not on file  . Transportation needs:    Medical: Not on file    Non-medical: Not on file  Tobacco Use  . Smoking status: Former Smoker    Packs/day: 3.00    Years: 30.00    Pack years: 90.00    Types: Cigarettes    Last attempt to quit: 09/17/1996    Years since quitting: 21.8  . Smokeless tobacco: Never Used  Substance and Sexual Activity  . Alcohol use: No  . Drug use: No  . Sexual activity: Not Currently    Birth control/protection: None  Lifestyle  . Physical activity:    Days per week: Not on file    Minutes per session: Not on file  . Stress: Not on file  Relationships  . Social connections:    Talks on phone: Not on file    Gets together: Not on file    Attends religious service: Not on file    Active member of club or organization: Not on file    Attends meetings of clubs or organizations: Not on file    Relationship status: Not on file  . Intimate partner violence:    Fear of current or ex partner: Not on file    Emotionally abused: Not on file    Physically abused: Not on file    Forced sexual activity: Not on file  Other Topics  Concern  . Not on file  Social History Narrative   Married and lives locally with his wife.  Sharyon Cable.    Outpatient Medications Prior to Visit  Medication Sig Dispense Refill  . acetaminophen (TYLENOL) 500 MG tablet Take 1,000 mg by mouth 2 (two) times daily.    Marland Kitchen albuterol (PROVENTIL HFA;VENTOLIN HFA) 108 (90 Base) MCG/ACT inhaler Inhale 2 puffs into the lungs every 6 (six) hours as needed for wheezing or shortness of breath. 1 Inhaler 0  . aspirin EC 81 MG tablet Take 81 mg by mouth daily.    . B Complex Vitamins (B COMPLEX-B12 PO) Take 1 tablet by mouth at bedtime.    . BD INSULIN SYRINGE ULTRAFINE 31G X 5/16" 1 ML MISC USE  TO INJECT  INSULIN  FIVE  TIMES  DAILY AS INSTRUCTED 450 each 3  . cephALEXin (KEFLEX) 500 MG capsule Take 1 capsule (500 mg total) by mouth 4 (four) times daily for 14 days. 56 capsule 0  . chlorpheniramine (CHLOR-TRIMETON) 4 MG tablet Take 4 mg by mouth daily.    . Cholecalciferol (VITAMIN D) 1000 UNITS capsule Take 1,000 Units by mouth at bedtime.     . fluocinonide cream (LIDEX) 2.53 % Apply 1 application topically daily as needed (for rash).     . fluticasone (FLONASE) 50 MCG/ACT nasal spray Place 2 sprays into both nostrils daily. 16 g 1  . furosemide (LASIX) 40 MG tablet TAKE 1 AND 1/2 TABLETS EVERY DAY (Patient taking differently: Take 60 mg by mouth every day) 135 tablet 2  . HYDROcodone-acetaminophen (NORCO/VICODIN) 5-325 MG tablet Take 1-2 tablets by mouth every 4 (four) hours as needed for severe pain ((score 7 to 10)). 60 tablet 0  . insulin NPH Human (NOVOLIN N) 100 UNIT/ML injection Inject 45 units in am and 40 in the evening 30 mL 3  . insulin regular (NOVOLIN  R RELION) 100 units/mL injection Inject 0.25-0.3 mLs (25-30 Units total) into the skin 3 (three) times daily before meals. (Patient taking differently: Inject 35 Units into the skin 3 (three) times daily before meals. ) 70 mL 3  . isosorbide mononitrate (IMDUR) 30 MG 24 hr tablet Take 1 tablet (30  mg total) by mouth daily. 30 tablet 0  . mupirocin ointment (BACTROBAN) 2 % Apply to area thin film twice daily 22 g 0  . nitroGLYCERIN (NITROSTAT) 0.4 MG SL tablet Place 1 tablet (0.4 mg total) under the tongue every 5 (five) minutes as needed for chest pain. 100 tablet 3  . Probiotic Product (PROBIOTIC PO) Take 1 capsule by mouth daily.    Marland Kitchen tiZANidine (ZANAFLEX) 4 MG capsule Take 1 capsule (4 mg total) by mouth 4 (four) times daily as needed for muscle spasms. Take 1 tablet every 6 hours as needed; not to exceed 3 doses in 24 hours. 40 capsule 2  . TRUE METRIX BLOOD GLUCOSE TEST test strip USE TO TEST BLOOD SUGAR THREE TIMES DAILY AS DIRECTED 300 each 3  . allopurinol (ZYLOPRIM) 300 MG tablet Take 1 tablet (300 mg total) by mouth daily. 90 tablet 1  . atorvastatin (LIPITOR) 40 MG tablet Take 1 tablet (40 mg total) by mouth daily. 90 tablet 1  . fenofibrate 160 MG tablet Take 1 tablet (160 mg total) by mouth at bedtime. 90 tablet 1  . metoprolol tartrate (LOPRESSOR) 50 MG tablet Take 1 tablet (50 mg total) by mouth 2 (two) times daily. 180 tablet 1  . pantoprazole (PROTONIX) 20 MG tablet TAKE 1 TABLET (20 MG TOTAL) BY MOUTH DAILY. 90 tablet 3  . tamsulosin (FLOMAX) 0.4 MG CAPS capsule Take 1 capsule (0.4 mg total) by mouth daily. 90 capsule 1   No facility-administered medications prior to visit.     Allergies  Allergen Reactions  . Levemir [Insulin Detemir] Swelling and Other (See Comments)    Patient had redness and swelling and tenderness at injection site.  . Codeine Other (See Comments)    Makes him "shakey", is OK with hydrocodone GI UPSET & TREMORS  . Lipitor [Atorvastatin] Other (See Comments)    Muscle aches; liver functions. Patient is currently taken  . Novolog Mix [Insulin Aspart Prot & Aspart] Other (See Comments)    Causes skin to swell/itch at injection site  . Other Other (See Comments)    Brilinta caused SOB    Review of Systems  Constitutional: Positive for  malaise/fatigue. Negative for fever.  HENT: Negative for congestion.   Eyes: Negative for blurred vision.  Respiratory: Negative for shortness of breath.   Cardiovascular: Positive for leg swelling. Negative for chest pain and palpitations.  Gastrointestinal: Positive for abdominal pain. Negative for blood in stool and nausea.  Genitourinary: Positive for dysuria. Negative for frequency.  Musculoskeletal: Positive for back pain. Negative for falls.  Skin: Negative for rash.  Neurological: Negative for dizziness, loss of consciousness and headaches.  Endo/Heme/Allergies: Negative for environmental allergies.  Psychiatric/Behavioral: Negative for depression. The patient is not nervous/anxious.        Objective:    Physical Exam  Constitutional: He is oriented to person, place, and time. He appears well-developed and well-nourished. No distress.  HENT:  Head: Normocephalic and atraumatic.  Nose: Nose normal.  Eyes: Right eye exhibits no discharge. Left eye exhibits no discharge.  Neck: Normal range of motion. Neck supple.  Cardiovascular: Normal rate and regular rhythm.  No murmur heard. Pulmonary/Chest: Effort normal  and breath sounds normal.  Abdominal: Soft. Bowel sounds are normal. There is no tenderness.  Musculoskeletal: He exhibits no edema.  Neurological: He is alert and oriented to person, place, and time.  Skin: Skin is warm and dry.  Psychiatric: He has a normal mood and affect.  Nursing note and vitals reviewed.   BP 122/62 (BP Location: Left Arm, Patient Position: Sitting, Cuff Size: Normal)   Pulse 64   Temp 97.8 F (36.6 C) (Oral)   Resp 18   Wt 251 lb (113.9 kg)   SpO2 96%   BMI 26.25 kg/m  Wt Readings from Last 3 Encounters:  08/01/18 251 lb (113.9 kg)  07/25/18 254 lb (115.2 kg)  06/07/18 245 lb 6.4 oz (111.3 kg)     Lab Results  Component Value Date   WBC 13.0 (H) 07/25/2018   HGB 12.3 (L) 07/25/2018   HCT 37.0 (L) 07/25/2018   PLT 172  07/25/2018   GLUCOSE 99 07/25/2018   CHOL 135 01/24/2018   TRIG 286.0 (H) 01/24/2018   HDL 25.20 (L) 01/24/2018   LDLDIRECT 69.0 01/24/2018   LDLCALC 31 04/09/2014   ALT 23 07/25/2018   AST 29 07/25/2018   NA 137 07/25/2018   K 3.4 (L) 07/25/2018   CL 102 07/25/2018   CREATININE 1.64 (H) 07/25/2018   BUN 29 (H) 07/25/2018   CO2 27 07/25/2018   TSH 3.39 01/24/2018   INR 1.1 02/24/2017   HGBA1C 6.1 (A) 05/10/2018   MICROALBUR 0.7 08/02/2014    Lab Results  Component Value Date   TSH 3.39 01/24/2018   Lab Results  Component Value Date   WBC 13.0 (H) 07/25/2018   HGB 12.3 (L) 07/25/2018   HCT 37.0 (L) 07/25/2018   MCV 97.1 07/25/2018   PLT 172 07/25/2018   Lab Results  Component Value Date   NA 137 07/25/2018   K 3.4 (L) 07/25/2018   CO2 27 07/25/2018   GLUCOSE 99 07/25/2018   BUN 29 (H) 07/25/2018   CREATININE 1.64 (H) 07/25/2018   BILITOT 0.7 07/25/2018   ALKPHOS 38 07/25/2018   AST 29 07/25/2018   ALT 23 07/25/2018   PROT 7.2 07/25/2018   ALBUMIN 4.0 07/25/2018   CALCIUM 10.0 07/25/2018   ANIONGAP 8 07/25/2018   GFR 43.99 (L) 06/07/2018   Lab Results  Component Value Date   CHOL 135 01/24/2018   Lab Results  Component Value Date   HDL 25.20 (L) 01/24/2018   Lab Results  Component Value Date   LDLCALC 31 04/09/2014   Lab Results  Component Value Date   TRIG 286.0 (H) 01/24/2018   Lab Results  Component Value Date   CHOLHDL 5 01/24/2018   Lab Results  Component Value Date   HGBA1C 6.1 (A) 05/10/2018       Assessment & Plan:   Problem List Items Addressed This Visit    EDEMA    Tried to decrease Lasix to 1 tab daily but has had 5 pound weight gain, encouraged to increase back to 1.5 tabs and monitor can decrease dose again in future as needed.      Renal insufficiency    Check cmp      Essential hypertension    Well controlled, no changes to meds. Encouraged heart healthy diet such as the DASH diet and exercise as tolerated.         Relevant Medications   metoprolol tartrate (LOPRESSOR) 50 MG tablet   fenofibrate 160 MG tablet   atorvastatin (  LIPITOR) 40 MG tablet   Uncontrolled type 2 diabetes mellitus with circulatory disorder, with long-term current use of insulin (HCC)     minimize simple carbs. Increase exercise as tolerated. Continue current meds      Relevant Medications   metoprolol tartrate (LOPRESSOR) 50 MG tablet   fenofibrate 160 MG tablet   atorvastatin (LIPITOR) 40 MG tablet   Muscle cramps    Persistent, encouraged togive up artificial sweeteners and hydrate better. H[employer] helps some, check labs      Relevant Orders   Comprehensive metabolic panel   Magnesium   UTI (urinary tract infection) - Primary    Presented to ED last week with dysuria and increased back and abdominal pain found to have Ecoli UTI and responding to Keflex will repeat urine culture and UA next week once antibiotic complete. If infection persistent may need to consider referral to urology with retreatment.      Relevant Orders   Urinalysis   Urine Culture   CBC with Differential/Platelet      I am having William Hendrix. William Hendrix "William Hendrix" maintain his Vitamin D, fluocinonide cream, aspirin EC, acetaminophen, B Complex Vitamins (B COMPLEX-B12 PO), albuterol, tiZANidine, BD INSULIN SYRINGE ULTRAFINE, TRUE METRIX BLOOD GLUCOSE TEST, furosemide, fluticasone, insulin regular, mupirocin ointment, chlorpheniramine, Probiotic Product (PROBIOTIC PO), HYDROcodone-acetaminophen, nitroGLYCERIN, insulin NPH Human, isosorbide mononitrate, cephALEXin, metoprolol tartrate, fenofibrate, allopurinol, atorvastatin, tamsulosin, and pantoprazole.  Meds ordered this encounter  Medications  . metoprolol tartrate (LOPRESSOR) 50 MG tablet    Sig: Take 1 tablet (50 mg total) by mouth 2 (two) times daily.    Dispense:  180 tablet    Refill:  1  . fenofibrate 160 MG tablet    Sig: Take 1 tablet (160 mg total) by mouth at bedtime.    Dispense:  90 tablet     Refill:  1  . allopurinol (ZYLOPRIM) 300 MG tablet    Sig: Take 1 tablet (300 mg total) by mouth daily.    Dispense:  90 tablet    Refill:  1  . atorvastatin (LIPITOR) 40 MG tablet    Sig: Take 1 tablet (40 mg total) by mouth daily.    Dispense:  90 tablet    Refill:  1  . tamsulosin (FLOMAX) 0.4 MG CAPS capsule    Sig: Take 1 capsule (0.4 mg total) by mouth daily.    Dispense:  90 capsule    Refill:  1  . pantoprazole (PROTONIX) 20 MG tablet    Sig: Take 1 tablet (20 mg total) by mouth daily.    Dispense:  90 tablet    Refill:  3     Penni Homans, MD

## 2018-08-01 NOTE — Telephone Encounter (Signed)
TRUE METRIX BLOOD GLUCOSE TEST test strip    Patient needs a prescription sent into the [facility], [place]

## 2018-08-11 ENCOUNTER — Other Ambulatory Visit (INDEPENDENT_AMBULATORY_CARE_PROVIDER_SITE_OTHER): Payer: Medicare HMO

## 2018-08-11 ENCOUNTER — Institutional Professional Consult (permissible substitution): Payer: Medicare HMO | Admitting: Pulmonary Disease

## 2018-08-11 DIAGNOSIS — N390 Urinary tract infection, site not specified: Secondary | ICD-10-CM | POA: Diagnosis not present

## 2018-08-11 LAB — URINALYSIS
BILIRUBIN URINE: NEGATIVE
HGB URINE DIPSTICK: NEGATIVE
KETONES UR: NEGATIVE
Leukocytes, UA: NEGATIVE
Nitrite: NEGATIVE
SPECIFIC GRAVITY, URINE: 1.01 (ref 1.000–1.030)
TOTAL PROTEIN, URINE-UPE24: NEGATIVE
URINE GLUCOSE: NEGATIVE
UROBILINOGEN UA: 0.2 (ref 0.0–1.0)
pH: 5.5 (ref 5.0–8.0)

## 2018-08-12 LAB — URINE CULTURE
MICRO NUMBER: 91311750
SPECIMEN QUALITY: ADEQUATE

## 2018-08-21 ENCOUNTER — Encounter (HOSPITAL_BASED_OUTPATIENT_CLINIC_OR_DEPARTMENT_OTHER): Payer: Self-pay | Admitting: *Deleted

## 2018-08-21 ENCOUNTER — Other Ambulatory Visit: Payer: Self-pay

## 2018-08-21 ENCOUNTER — Emergency Department (HOSPITAL_BASED_OUTPATIENT_CLINIC_OR_DEPARTMENT_OTHER)
Admission: EM | Admit: 2018-08-21 | Discharge: 2018-08-22 | Disposition: A | Discharge status: Home / Self Care | Payer: Medicare HMO | Attending: Emergency Medicine | Admitting: Emergency Medicine

## 2018-08-21 DIAGNOSIS — N183 Chronic kidney disease, stage 3 (moderate): Secondary | ICD-10-CM | POA: Diagnosis not present

## 2018-08-21 DIAGNOSIS — I5032 Chronic diastolic (congestive) heart failure: Secondary | ICD-10-CM | POA: Insufficient documentation

## 2018-08-21 DIAGNOSIS — E1140 Type 2 diabetes mellitus with diabetic neuropathy, unspecified: Secondary | ICD-10-CM | POA: Diagnosis not present

## 2018-08-21 DIAGNOSIS — E1122 Type 2 diabetes mellitus with diabetic chronic kidney disease: Secondary | ICD-10-CM | POA: Insufficient documentation

## 2018-08-21 DIAGNOSIS — Z87891 Personal history of nicotine dependence: Secondary | ICD-10-CM | POA: Diagnosis not present

## 2018-08-21 DIAGNOSIS — Z79899 Other long term (current) drug therapy: Secondary | ICD-10-CM | POA: Insufficient documentation

## 2018-08-21 DIAGNOSIS — E1159 Type 2 diabetes mellitus with other circulatory complications: Secondary | ICD-10-CM | POA: Diagnosis not present

## 2018-08-21 DIAGNOSIS — Z7982 Long term (current) use of aspirin: Secondary | ICD-10-CM | POA: Insufficient documentation

## 2018-08-21 DIAGNOSIS — Z794 Long term (current) use of insulin: Secondary | ICD-10-CM | POA: Diagnosis not present

## 2018-08-21 DIAGNOSIS — I130 Hypertensive heart and chronic kidney disease with heart failure and stage 1 through stage 4 chronic kidney disease, or unspecified chronic kidney disease: Secondary | ICD-10-CM | POA: Diagnosis not present

## 2018-08-21 DIAGNOSIS — N390 Urinary tract infection, site not specified: Secondary | ICD-10-CM | POA: Diagnosis not present

## 2018-08-21 DIAGNOSIS — R1030 Lower abdominal pain, unspecified: Secondary | ICD-10-CM | POA: Diagnosis present

## 2018-08-21 LAB — CBC WITH DIFFERENTIAL/PLATELET
Abs Immature Granulocytes: 0.02 10*3/uL (ref 0.00–0.07)
BASOS ABS: 0 10*3/uL (ref 0.0–0.1)
Basophils Relative: 0 %
EOS ABS: 0.1 10*3/uL (ref 0.0–0.5)
EOS PCT: 1 %
HEMATOCRIT: 34.6 % — AB (ref 39.0–52.0)
Hemoglobin: 11.5 g/dL — ABNORMAL LOW (ref 13.0–17.0)
Immature Granulocytes: 0 %
LYMPHS ABS: 1.8 10*3/uL (ref 0.7–4.0)
Lymphocytes Relative: 26 %
MCH: 31.9 pg (ref 26.0–34.0)
MCHC: 33.2 g/dL (ref 30.0–36.0)
MCV: 96.1 fL (ref 80.0–100.0)
Monocytes Absolute: 0.9 10*3/uL (ref 0.1–1.0)
Monocytes Relative: 13 %
NRBC: 0 % (ref 0.0–0.2)
Neutro Abs: 4.1 10*3/uL (ref 1.7–7.7)
Neutrophils Relative %: 60 %
Platelets: 149 10*3/uL — ABNORMAL LOW (ref 150–400)
RBC: 3.6 MIL/uL — ABNORMAL LOW (ref 4.22–5.81)
RDW: 13.8 % (ref 11.5–15.5)
WBC: 7 10*3/uL (ref 4.0–10.5)

## 2018-08-21 LAB — URINALYSIS, ROUTINE W REFLEX MICROSCOPIC
Bilirubin Urine: NEGATIVE
Glucose, UA: NEGATIVE mg/dL
Ketones, ur: NEGATIVE mg/dL
NITRITE: NEGATIVE
Protein, ur: NEGATIVE mg/dL
Specific Gravity, Urine: 1.02 (ref 1.005–1.030)
pH: 5.5 (ref 5.0–8.0)

## 2018-08-21 LAB — COMPREHENSIVE METABOLIC PANEL
ALT: 17 U/L (ref 0–44)
ANION GAP: 9 (ref 5–15)
AST: 20 U/L (ref 15–41)
Albumin: 3.4 g/dL — ABNORMAL LOW (ref 3.5–5.0)
Alkaline Phosphatase: 39 U/L (ref 38–126)
BUN: 36 mg/dL — ABNORMAL HIGH (ref 8–23)
CO2: 28 mmol/L (ref 22–32)
Calcium: 9.8 mg/dL (ref 8.9–10.3)
Chloride: 100 mmol/L (ref 98–111)
Creatinine, Ser: 1.6 mg/dL — ABNORMAL HIGH (ref 0.61–1.24)
GFR calc Af Amer: 46 mL/min — ABNORMAL LOW (ref >60)
GFR, EST NON AFRICAN AMERICAN: 40 mL/min — AB (ref >60)
Glucose, Bld: 122 mg/dL — ABNORMAL HIGH (ref 70–99)
Potassium: 3.5 mmol/L (ref 3.5–5.1)
Sodium: 137 mmol/L (ref 135–145)
TOTAL PROTEIN: 6.9 g/dL (ref 6.5–8.1)
Total Bilirubin: 0.7 mg/dL (ref 0.3–1.2)

## 2018-08-21 LAB — URINALYSIS, MICROSCOPIC (REFLEX)

## 2018-08-21 LAB — I-STAT CG4 LACTIC ACID, ED: LACTIC ACID, VENOUS: 1.04 mmol/L (ref 0.5–1.9)

## 2018-08-21 MED ORDER — ONDANSETRON HCL 4 MG/2ML IJ SOLN
4.0000 mg | Freq: Once | INTRAMUSCULAR | Status: AC
  Administered 2018-08-21: 4 mg via INTRAVENOUS
  Filled 2018-08-21: qty 2

## 2018-08-21 MED ORDER — CEPHALEXIN 500 MG PO CAPS: 500.0000 mg | ORAL_CAPSULE | Freq: Four times a day (QID) | ORAL | 0 refills | Status: AC

## 2018-08-21 MED ORDER — SODIUM CHLORIDE 0.9 % IV BOLUS
1000.0000 mL | Freq: Once | INTRAVENOUS | Status: AC
  Administered 2018-08-21: 1000 mL via INTRAVENOUS

## 2018-08-21 MED ORDER — SODIUM CHLORIDE 0.9 % IV SOLN
1.0000 g | Freq: Once | INTRAVENOUS | Status: AC
  Administered 2018-08-21: 1 g via INTRAVENOUS
  Filled 2018-08-21: qty 10

## 2018-08-21 MED ORDER — MORPHINE SULFATE (PF) 4 MG/ML IV SOLN
4.0000 mg | Freq: Once | INTRAVENOUS | Status: AC
  Administered 2018-08-21: 4 mg via INTRAVENOUS
  Filled 2018-08-21: qty 1

## 2018-08-21 NOTE — ED Provider Notes (Signed)
[facility] Provider Note   CSN: [id number] Arrival date & time: 08/21/18  2019     History   Chief Complaint Chief Complaint  Patient presents with  . Abdominal Pain  . Flank Pain    HPI SQUARE [facility] is a 77 y.o. male.  Patient is a 77 year old male with multiple medical problems including coronary artery disease, CHF, chronic kidney disease stage III, diabetes, AAA at 3.2 cm and recent urinary tract infection 3 weeks ago presenting today with 3 days of worsening lower abdominal pain, dysuria, frequency, urgency, hematuria, decreased appetite, generalized weakness, nausea and fever yesterday of 102.  Patient states he completed a 14-day course of Keflex and went back to see Dr. Maryellen Pile and had a UA at that time and culture that grew no bacteria.  He had been doing well until 3 days ago and symptoms week occurred.  Patient does admit to having frequent leaking of urination so he does wear a pad.  He denies any rectal pain or GI complaints.  He has no shortness of breath, cough, chest pain.  Patient has not seen a urologist and has had no recent procedures done.  The history is provided by the patient.  Abdominal Pain   This is a recurrent problem. Episode onset: 3 days ago. The problem occurs constantly. The problem has been gradually worsening. Associated with: urinating. The pain is located in the suprapubic region. The pain is at a severity of 5/10. The pain is moderate. Associated symptoms include anorexia, fever, nausea, dysuria and hematuria. Pertinent negatives include diarrhea and vomiting. The symptoms are aggravated by urination. Nothing relieves the symptoms. Past medical history comments: prior UTI 3 weeks ago with e.coli which was pan sensitive.  Flank Pain  Associated symptoms include abdominal pain.    Past Medical History:  Diagnosis Date  . AAA (abdominal aortic aneurysm) ([place]) 12/15/2014   a. Mild aneurysmal dilatation of the  infrarenal abdominal aorta 3.2 cm - f/u due by 2019.  . Arthritis    "all over" (07/30/2016)  . CAD (coronary artery disease)    a. stent to LAD 2000. b. possible spasm by cath 2001. c. IVUS/PTCA/DES to mLAD 05/2013. d. PTCA/DES of dRCA into ostial rPDA 07/2013. e. PTCA of OM2, DES to Cx, DES to [facility] 07/2016.  Marland Kitchen Chronic bronchitis ([place])   . Chronic diastolic CHF (congestive heart failure) ([place])   . Chronic lower back pain   . Chronic neck pain   . CKD (chronic kidney disease), stage III ([place])   . Diabetic peripheral neuropathy ([place])   . Ejection fraction    55%, 07/2010, mild inferior hypo  . GERD (gastroesophageal reflux disease)   . Gout   . H/O diverticulitis of colon 01/31/2015  . H/O hiatal hernia   . History of blood transfusion ~ 10/1940   "had pneumonia"  . History of kidney stones   . HTN (hypertension)   . Hyperlipidemia   . Melanoma of forearm, left ([place]) 02/2011   with wide excision   . Myocardial infarction ([place])   . Nephrolithiasis   . Obesity   . OSA on CPAP     [name] since 2000  . Peripheral neuropathy    both feet  . Positive D-dimer    a. significant elevation ,hospital 07/2010, etiology unclear. b. D Dimer chronically > 20.  Marland Kitchen Renal insufficiency   . Skin cancer   . Spinal stenosis    a. s/p surgical repair 2013  .  Spinal stenosis of lumbar region   . Type II diabetes mellitus ([place])   . Ventral hernia     Patient Active Problem List   Diagnosis Date Noted  . UTI (urinary tract infection) 08/01/2018  . Muscle cramps 06/13/2018  . Urinary incontinence 05/01/2018  . Anemia 11/07/2017  . Constipation 11/07/2017  . Lumbar stenosis with neurogenic claudication 10/22/2017  . Back pain 08/29/2017  . Right-sided low back pain with right-sided sciatica 11/22/2016  . Pressure injury of skin 08/03/2016  . Dyspnea 08/01/2016  . NSTEMI (non-ST elevated myocardial infarction) ([place]) 07/30/2016  . CAD in native artery   . Obesity   . Uncontrolled type 2  diabetes mellitus with circulatory disorder, with long-term current use of insulin ([place]) 11/28/2015  . Lumbar radiculopathy 10/09/2015  . Neuropathy, diabetic ([place]) 05/22/2015  . H/O diverticulitis of colon 01/31/2015  . AAA (abdominal aortic aneurysm) ([place]) 01/01/2015  . Sinusitis 10/10/2014  . Right hip pain 07/05/2014  . Palpitations 02/27/2014  . Claudication of lower extremity ([place]) 10/25/2013  . BPH with obstruction/lower urinary tract symptoms 10/25/2013  . Hypertriglyceridemia 08/15/2012  . Melanoma ([place])   . Dizziness   . Renal insufficiency   . Coronary artery disease with exertional angina ([place])   . Hyperlipidemia   . Essential hypertension   . OSA (obstructive sleep apnea)   . Ejection fraction   . SOB (shortness of breath)   . PARESTHESIA 05/30/2009  . EDEMA 01/22/2009  . OTHER ABNORMAL BLOOD CHEMISTRY 04/25/2008  . Gout 10/21/2007  . DEPRESSION 10/21/2007  . VENTRAL HERNIA 10/21/2007  . HIATAL HERNIA 10/21/2007  . FATIGUE 10/21/2007  . SKIN CANCER, HX OF 10/21/2007  . NEPHROLITHIASIS, HX OF 10/21/2007    Past Surgical History:  Procedure Laterality Date  . ANTERIOR LAT LUMBAR FUSION  10/22/2017   Procedure: Lumbar two-three Lumbar three-four Lumbar four-five Anteriolateral lumbar interbody fusion with percutaneous pedicle screw fixation and infuse;  Surgeon: [name], MD;  Location: [facility];  Service: Neurosurgery;;  . APPLICATION OF ROBOTIC ASSISTANCE FOR SPINAL PROCEDURE  10/22/2017   Procedure: APPLICATION OF ROBOTIC ASSISTANCE FOR SPINAL PROCEDURE;  Surgeon: [name], MD;  Location: [facility];  Service: Neurosurgery;;  . BACK SURGERY    . CARDIAC CATHETERIZATION N/A 07/30/2016   Procedure: Left Heart Cath and Coronary Angiography;  Surgeon: Nelva Bush, MD;  Location: [place] CV LAB;  Service: Cardiovascular;  Laterality: N/A;  . CARDIAC CATHETERIZATION N/A 07/30/2016   Procedure: Coronary Stent Intervention;  Surgeon: Nelva Bush, MD;   Location: [place] CV LAB;  Service: Cardiovascular;  Laterality: N/A;  Mid CFX and MID RCA  . CARDIAC CATHETERIZATION N/A 07/30/2016   Procedure: Coronary Balloon Angioplasty;  Surgeon: Nelva Bush, MD;  Location: [place] CV LAB;  Service: Cardiovascular;  Laterality: N/A;  OM 1  . CARPAL TUNNEL RELEASE Left   . CATARACT EXTRACTION W/ INTRAOCULAR LENS  IMPLANT, BILATERAL Bilateral ~ 2014  . CERVICAL DISC SURGERY  1990s  . CORONARY ANGIOPLASTY WITH STENT PLACEMENT  05/2013  . CORONARY ANGIOPLASTY WITH STENT PLACEMENT  07/26/2013   DES to RCA extending to PDA    . CORONARY ANGIOPLASTY WITH STENT PLACEMENT  07/30/2016  . CORONARY ANGIOPLASTY WITH STENT PLACEMENT  2000   CAD  . DENTAL SURGERY  04/2016   "got infected; had to dig it out"  . EYE SURGERY    . KNEE ARTHROSCOPY Right 1990's   rt  . LEFT AND RIGHT HEART CATHETERIZATION WITH CORONARY ANGIOGRAM N/A 07/26/2013  Procedure: LEFT AND RIGHT HEART CATHETERIZATION WITH CORONARY ANGIOGRAM;  Surgeon: [name], MD;  Location: [facility] CATH LAB;  Service: Cardiovascular;  Laterality: N/A;  . LEFT HEART CATH AND CORONARY ANGIOGRAPHY N/A 02/26/2017   Procedure: Left Heart Cath and Coronary Angiography;  Surgeon: Sherren Mocha, MD;  Location: [facility] CV LAB;  Service: Cardiovascular;  Laterality: N/A;  . LEFT HEART CATHETERIZATION WITH CORONARY ANGIOGRAM N/A 05/19/2013   Procedure: LEFT HEART CATHETERIZATION WITH CORONARY ANGIOGRAM;  Surgeon: [name], MD;  Location: [facility] CATH LAB;  Service: Cardiovascular;  Laterality: N/A;  . LUMBAR LAMINECTOMY/DECOMPRESSION MICRODISCECTOMY  08/30/2012   Procedure: LUMBAR LAMINECTOMY/DECOMPRESSION MICRODISCECTOMY 2 LEVELS;  Surgeon: [name], MD;  Location: [place] NEURO ORS;  Service: Neurosurgery;  Laterality: Bilateral;  Bilateral Lumbar three-four Lumbar four-five Laminotomies  . LUMBAR PERCUTANEOUS PEDICLE SCREW 1 LEVEL Bilateral 10/22/2017   Procedure: LUMBAR PERCUTANEOUS PEDICLE SCREW 1  LEVEL;  Surgeon: [name], MD;  Location: [place];  Service: Neurosurgery;  Laterality: Bilateral;  . LUMBAR SPINE SURGERY  04/07/2016   Dr. Ellene Route; "?ruptured disc"  . MELANOMA EXCISION Left 02/2011   forearm  . NASAL SINUS SURGERY  1970s   "cut windows in sinus pockets"  . PERCUTANEOUS CORONARY STENT INTERVENTION (PCI-S)  05/19/2013   Procedure: PERCUTANEOUS CORONARY STENT INTERVENTION (PCI-S);  Surgeon: [name], MD;  Location: [facility] CATH LAB;  Service: Cardiovascular;;  . ULNAR TUNNEL RELEASE Left 04/07/2016   Dr. Ellene Route        Home Medications    Prior to Admission medications   Medication Sig Start Date End Date Taking? Authorizing Provider  acetaminophen (TYLENOL) 500 MG tablet Take 1,000 mg by mouth 2 (two) times daily.    [provider]  albuterol (PROVENTIL HFA;VENTOLIN HFA) 108 (90 Base) MCG/ACT inhaler Inhale 2 puffs into the lungs every 6 (six) hours as needed for wheezing or shortness of breath. 10/30/16   Saguier, Percell Miller, PA-C  allopurinol (ZYLOPRIM) 300 MG tablet Take 1 tablet (300 mg total) by mouth daily. 08/01/18   [name], MD  aspirin EC 81 MG tablet Take 81 mg by mouth daily. 02/23/11   [name], MD  atorvastatin (LIPITOR) 40 MG tablet Take 1 tablet (40 mg total) by mouth daily. 08/01/18   [name], MD  B Complex Vitamins (B COMPLEX-B12 PO) Take 1 tablet by mouth at bedtime. 07/23/16   [provider]  BD INSULIN SYRINGE ULTRAFINE 31G X 5/16" 1 ML MISC USE  TO INJECT  INSULIN  FIVE  TIMES  DAILY AS INSTRUCTED 03/17/17   [name], MD  chlorpheniramine (CHLOR-TRIMETON) 4 MG tablet Take 4 mg by mouth daily.    [provider]  Cholecalciferol (VITAMIN D) 1000 UNITS capsule Take 1,000 Units by mouth at bedtime.     [provider]  fenofibrate 160 MG tablet Take 1 tablet (160 mg total) by mouth at bedtime. 08/01/18   [name], MD  fluocinonide cream (LIDEX) 6.22 % Apply 1 application topically  daily as needed (for rash).  08/11/12   [provider]  fluticasone (FLONASE) 50 MCG/ACT nasal spray Place 2 sprays into both nostrils daily. 08/11/17   Saguier, Percell Miller, PA-C  furosemide (LASIX) 40 MG tablet TAKE 1 AND 1/2 TABLETS EVERY DAY Patient taking differently: Take 60 mg by mouth every day 07/12/17   Lelon Perla, MD  glucose blood (TRUE METRIX BLOOD GLUCOSE TEST) test strip USE TO TEST BLOOD SUGAR THREE TIMES DAILY AS DIRECTED 08/01/18  [name], MD  HYDROcodone-acetaminophen (NORCO/VICODIN) 5-325 MG tablet Take 1-2 tablets by mouth every 4 (four) hours as needed for severe pain ((score 7 to 10)). 10/25/17   [name], MD  insulin NPH Human (NOVOLIN N) 100 UNIT/ML injection Inject 45 units in am and 40 in the evening 05/09/18   [name], MD  insulin regular (NOVOLIN R RELION) 100 units/mL injection Inject 0.25-0.3 mLs (25-30 Units total) into the skin 3 (three) times daily before meals. Patient taking differently: Inject 35 Units into the skin 3 (three) times daily before meals.  09/17/17   [name], MD  isosorbide mononitrate (IMDUR) 30 MG 24 hr tablet Take 1 tablet (30 mg total) by mouth daily. 05/13/18   Lelon Perla, MD  metoprolol tartrate (LOPRESSOR) 50 MG tablet Take 1 tablet (50 mg total) by mouth 2 (two) times daily. 08/01/18   [name], MD  mupirocin ointment Drue Stager) 2 % Apply to area thin film twice daily 09/29/17   Saguier, Percell Miller, PA-C  nitroGLYCERIN (NITROSTAT) 0.4 MG SL tablet Place 1 tablet (0.4 mg total) under the tongue every 5 (five) minutes as needed for chest pain. 02/16/18   Lelon Perla, MD  pantoprazole (PROTONIX) 20 MG tablet Take 1 tablet (20 mg total) by mouth daily. 08/01/18   [name], MD  Probiotic Product (PROBIOTIC PO) Take 1 capsule by mouth daily.    [provider]  tamsulosin (FLOMAX) 0.4 MG CAPS capsule Take 1 capsule (0.4 mg total) by mouth daily. 08/01/18   [name], MD    tiZANidine (ZANAFLEX) 4 MG capsule Take 1 capsule (4 mg total) by mouth 4 (four) times daily as needed for muscle spasms. Take 1 tablet every 6 hours as needed; not to exceed 3 doses in 24 hours. 02/09/17   [name], MD    Family History Family History  Problem Relation Age of Onset  . Cancer Mother        intestinal   . Heart disease Mother   . Cancer Father        prostate  . Migraines Daughter   . Leukemia Sister   . Heart disease Sister   . Prostate cancer Unknown   . Kidney cancer Unknown   . Cancer Unknown        Bladder cancer  . Coronary artery disease Unknown   . Hypertension Sister   . Hypertension Brother   . Heart attack Neg Hx   . Stroke Neg Hx     Social History Social History   Tobacco Use  . Smoking status: Former Smoker    Packs/day: 3.00    Years: 30.00    Pack years: 90.00    Types: Cigarettes    Last attempt to quit: 09/17/1996    Years since quitting: 21.9  . Smokeless tobacco: Never Used  Substance Use Topics  . Alcohol use: No  . Drug use: No     Allergies   Levemir [insulin detemir]; Codeine; Lipitor [atorvastatin]; Novolog mix [insulin aspart prot & aspart]; and Other   Review of Systems Review of Systems  Constitutional: Positive for fever.  Gastrointestinal: Positive for abdominal pain, anorexia and nausea. Negative for diarrhea and vomiting.  Genitourinary: Positive for dysuria, flank pain and hematuria.  All other systems reviewed and are negative.    Physical Exam Updated Vital Signs BP (!) 110/57   Pulse 60   Temp 97.6 F (36.4 C) (Oral)   Resp 18   SpO2  98%   Physical Exam  Constitutional: He is oriented to person, place, and time. He appears well-developed and well-nourished. No distress.  HENT:  Head: Normocephalic and atraumatic.  Mouth/Throat: Oropharynx is clear and moist.  Dry mucous membranes  Eyes: Pupils are equal, round, and reactive to light. Conjunctivae and EOM are normal.  Neck: Normal range of  motion. Neck supple.  Cardiovascular: Normal rate, regular rhythm and intact distal pulses.  No murmur heard. Pulmonary/Chest: Effort normal and breath sounds normal. No respiratory distress. He has no wheezes. He has no rales.  Abdominal: Soft. He exhibits no distension. There is tenderness in the suprapubic area. There is no rebound, no guarding and no CVA tenderness.  Musculoskeletal: Normal range of motion. He exhibits no edema or tenderness.  Neurological: He is alert and oriented to person, place, and time.  Skin: Skin is warm and dry. Capillary refill takes less than 2 seconds. No rash noted. No erythema.  Psychiatric: He has a normal mood and affect. His behavior is normal.  Nursing note and vitals reviewed.    ED Treatments / Results  Labs (all labs ordered are listed, but only abnormal results are displayed) Labs Reviewed  CBC WITH DIFFERENTIAL/PLATELET - Abnormal; Notable for the following components:      Result Value   RBC 3.60 (*)    Hemoglobin 11.5 (*)    HCT 34.6 (*)    Platelets 149 (*)    All other components within normal limits  COMPREHENSIVE METABOLIC PANEL - Abnormal; Notable for the following components:   Glucose, Bld 122 (*)    BUN 36 (*)    Creatinine, Ser 1.60 (*)    Albumin 3.4 (*)    GFR calc non Af Amer 40 (*)    GFR calc Af Amer 46 (*)    All other components within normal limits  URINALYSIS, ROUTINE W REFLEX MICROSCOPIC - Abnormal; Notable for the following components:   APPearance CLOUDY (*)    Hgb urine dipstick MODERATE (*)    Leukocytes, UA MODERATE (*)    All other components within normal limits  URINALYSIS, MICROSCOPIC (REFLEX) - Abnormal; Notable for the following components:   Bacteria, UA MANY (*)    All other components within normal limits  URINE CULTURE  I-STAT CG4 LACTIC ACID, ED    EKG [facility]  Radiology No results found.  Procedures Procedures (including critical care time)  Medications Ordered in ED Medications    cefTRIAXone (ROCEPHIN) 1 g in sodium chloride 0.9 % 100 mL IVPB (has no administration in time range)  morphine 4 MG/ML injection 4 mg (4 mg Intravenous Given 08/21/18 2152)  ondansetron (ZOFRAN) injection 4 mg (4 mg Intravenous Given 08/21/18 2152)  sodium chloride 0.9 % bolus 1,000 mL (1,000 mLs Intravenous New Bag/Given 08/21/18 2151)     Initial Impression / Assessment and Plan / ED Course  I have reviewed the triage vital signs and the nursing notes.  Pertinent labs & imaging results that were available during my care of the patient were reviewed by me and considered in my medical decision making (see chart for details).     Elderly male with multiple medical problems returning today with symptoms consistent with recurrent UTI.  Patient was seen approximately 3 weeks ago and found to have E. coli that was pansensitive.  At that time he had a CT of his abdomen and pelvis which was negative for stones.  Symptoms improved after 2 weeks of Keflex and he followed up with  his PCP with a negative repeat culture.  Patient is afebrile here with reassuring vital signs.  He does look dehydrated and was given IV fluids.  Today lactate is within normal limits, creatinine is unchanged at 1.6, normal white blood cell count.  UA is concerning for infection with large leukocytes and greater than 50 white blood cells.  Patient denies any symptoms concerning for urinary retention.  Patient given a dose of Rocephin, urine recultured and will discharge home with Keflex.  He has follow-up with Dr. Maryellen Pile on November 18 and at that time we will discuss if he needs prophylactic antibiotics or follow-up with urology.  Final Clinical Impressions(s) / ED Diagnoses   Final diagnoses:  Lower urinary tract infectious disease    ED Discharge Orders         Ordered    cephALEXin (KEFLEX) 500 MG capsule  4 times daily     08/21/18 2248           Blanchie Dessert, MD 08/21/18 2255

## 2018-08-21 NOTE — ED Triage Notes (Signed)
Pt reports "feeling bad" x 3 days.C/o back pain, "kidney pain", and hematuria with clots

## 2018-08-21 NOTE — Discharge Instructions (Signed)
Take the antibiotics as prescribed.  Return if you have ongoing fever, vomiting or worsening pain

## 2018-08-23 LAB — URINE CULTURE

## 2018-08-26 NOTE — Progress Notes (Addendum)
Subjective:   William Hendrix is a 77 y.o. male who presents for an Initial Medicare Annual Wellness Visit.  Review of Systems  No ROS.  Medicare Wellness Visit. Additional risk factors are reflected in the social history. Cardiac Risk Factors include: advanced age (>96men, >66 women);hypertension;diabetes mellitus;dyslipidemia;obesity (BMI >30kg/m2);sedentary lifestyle Sleep patterns: wears CPAP Home Safety/Smoke Alarms: Feels safe in home. Smoke alarms in place. Lives with wife in one story home. Walk-in shower with bench and grab rails.  Male:   CCS-  No longer doing routine screening due to age.   PSA- No results found for: PSA Eye- past due. Pt states he knows he needs to go. Wears glasses for reading only.     Objective:    Today's Vitals   08/29/18 1039  BP: (!) 116/56  Pulse: 64  SpO2: 97%  Weight: 261 lb 12.8 oz (118.8 kg)  Height: 5' 10.5" (1.791 m)   Body mass index is 37.03 kg/m.  Advanced Directives 08/29/2018 08/21/2018 07/25/2018 04/23/2018 02/15/2018 10/20/2017 08/26/2017  Does Patient Have a Medical Advance Directive? No No No No No No Yes  Type of Advance Directive - - - - - - Press photographer;Living will  Copy of [place] in Chart? - - - - - - No - copy requested  Would patient like information on creating a medical advance directive? Yes (MAU/Ambulatory/Procedural Areas - Information given) - - - No - Patient declined No - Patient declined -    Current Medications (verified) Outpatient Encounter Medications as of 08/29/2018  Medication Sig  . acetaminophen (TYLENOL) 500 MG tablet Take 1,000 mg by mouth 2 (two) times daily.  Marland Kitchen allopurinol (ZYLOPRIM) 300 MG tablet Take 1 tablet (300 mg total) by mouth daily.  Marland Kitchen aspirin EC 81 MG tablet Take 81 mg by mouth daily.  Marland Kitchen atorvastatin (LIPITOR) 40 MG tablet Take 1 tablet (40 mg total) by mouth daily.  . B Complex Vitamins (B COMPLEX-B12 PO) Take 1 tablet by mouth at bedtime.  . BD  INSULIN SYRINGE ULTRAFINE 31G X 5/16" 1 ML MISC USE  TO INJECT  INSULIN  FIVE  TIMES  DAILY AS INSTRUCTED  . cephALEXin (KEFLEX) 500 MG capsule Take 1 capsule (500 mg total) by mouth 4 (four) times daily for 14 days.  . chlorpheniramine (CHLOR-TRIMETON) 4 MG tablet Take 4 mg by mouth daily.  . Cholecalciferol (VITAMIN D) 1000 UNITS capsule Take 1,000 Units by mouth at bedtime.   . fenofibrate 160 MG tablet Take 1 tablet (160 mg total) by mouth at bedtime.  . fluocinonide cream (LIDEX) 1.74 % Apply 1 application topically daily as needed (for rash).   . fluticasone (FLONASE) 50 MCG/ACT nasal spray Place 2 sprays into both nostrils daily.  . furosemide (LASIX) 40 MG tablet TAKE 1 AND 1/2 TABLETS EVERY DAY (Patient taking differently: Take 60 mg by mouth every day)  . glucose blood (TRUE METRIX BLOOD GLUCOSE TEST) test strip USE TO TEST BLOOD SUGAR THREE TIMES DAILY AS DIRECTED  . HYDROcodone-acetaminophen (NORCO/VICODIN) 5-325 MG tablet Take 1-2 tablets by mouth every 4 (four) hours as needed for severe pain ((score 7 to 10)).  Marland Kitchen insulin NPH Human (NOVOLIN N) 100 UNIT/ML injection Inject 45 units in am and 40 in the evening (Patient taking differently: Inject 45 units in am and 50 in the evening)  . insulin regular (NOVOLIN R RELION) 100 units/mL injection Inject 0.25-0.3 mLs (25-30 Units total) into the skin 3 (three) times daily before  meals. (Patient taking differently: Inject 35 Units into the skin 3 (three) times daily before meals. )  . isosorbide mononitrate (IMDUR) 30 MG 24 hr tablet Take 1 tablet (30 mg total) by mouth daily.  . metoprolol tartrate (LOPRESSOR) 50 MG tablet Take 1 tablet (50 mg total) by mouth 2 (two) times daily.  . mupirocin ointment (BACTROBAN) 2 % Apply to area thin film twice daily  . nitroGLYCERIN (NITROSTAT) 0.4 MG SL tablet Place 1 tablet (0.4 mg total) under the tongue every 5 (five) minutes as needed for chest pain.  . pantoprazole (PROTONIX) 20 MG tablet Take 1  tablet (20 mg total) by mouth daily.  . Probiotic Product (PROBIOTIC PO) Take 1 capsule by mouth daily.  . tamsulosin (FLOMAX) 0.4 MG CAPS capsule Take 1 capsule (0.4 mg total) by mouth daily.  Marland Kitchen tiZANidine (ZANAFLEX) 4 MG capsule Take 1 capsule (4 mg total) by mouth 4 (four) times daily as needed for muscle spasms. Take 1 tablet every 6 hours as needed; not to exceed 3 doses in 24 hours.  Marland Kitchen albuterol (PROVENTIL HFA;VENTOLIN HFA) 108 (90 Base) MCG/ACT inhaler Inhale 2 puffs into the lungs every 6 (six) hours as needed for wheezing or shortness of breath. (Patient not taking: Reported on 08/29/2018)   No facility-administered encounter medications on file as of 08/29/2018.     Allergies (verified) Levemir [insulin detemir]; Codeine; Lipitor [atorvastatin]; Novolog mix [insulin aspart prot & aspart]; and Other   History: Past Medical History:  Diagnosis Date  . AAA (abdominal aortic aneurysm) ([place]) 12/15/2014   a. Mild aneurysmal dilatation of the infrarenal abdominal aorta 3.2 cm - f/u due by 2019.  . Arthritis    "all over" (07/30/2016)  . CAD (coronary artery disease)    a. stent to LAD 2000. b. possible spasm by cath 2001. c. IVUS/PTCA/DES to mLAD 05/2013. d. PTCA/DES of dRCA into ostial rPDA 07/2013. e. PTCA of OM2, DES to Cx, DES to [facility] 07/2016.  Marland Kitchen Chronic bronchitis ([place])   . Chronic diastolic CHF (congestive heart failure) ([place])   . Chronic lower back pain   . Chronic neck pain   . CKD (chronic kidney disease), stage III ([place])   . Diabetic peripheral neuropathy ([place])   . Ejection fraction    55%, 07/2010, mild inferior hypo  . GERD (gastroesophageal reflux disease)   . Gout   . H/O diverticulitis of colon 01/31/2015  . H/O hiatal hernia   . History of blood transfusion ~ 10/1940   "had pneumonia"  . History of kidney stones   . HTN (hypertension)   . Hyperlipidemia   . Melanoma of forearm, left ([place]) 02/2011   with wide excision   . Myocardial infarction ([place])   .  Nephrolithiasis   . Obesity   . OSA on CPAP     [name] since 2000  . Peripheral neuropathy    both feet  . Positive D-dimer    a. significant elevation ,hospital 07/2010, etiology unclear. b. D Dimer chronically > 20.  Marland Kitchen Renal insufficiency   . Skin cancer   . Spinal stenosis    a. s/p surgical repair 2013  . Spinal stenosis of lumbar region   . Type II diabetes mellitus ([place])   . Ventral hernia    Past Surgical History:  Procedure Laterality Date  . ANTERIOR LAT LUMBAR FUSION  10/22/2017   Procedure: Lumbar two-three Lumbar three-four Lumbar four-five Anteriolateral lumbar interbody fusion with percutaneous pedicle screw fixation and infuse;  Surgeon:  [name], MD;  Location: [facility];  Service: Neurosurgery;;  . APPLICATION OF ROBOTIC ASSISTANCE FOR SPINAL PROCEDURE  10/22/2017   Procedure: APPLICATION OF ROBOTIC ASSISTANCE FOR SPINAL PROCEDURE;  Surgeon: [name], MD;  Location: [facility];  Service: Neurosurgery;;  . BACK SURGERY    . CARDIAC CATHETERIZATION N/A 07/30/2016   Procedure: Left Heart Cath and Coronary Angiography;  Surgeon: Nelva Bush, MD;  Location: [facility];  Service: Cardiovascular;  Laterality: N/A;  . CARDIAC CATHETERIZATION N/A 07/30/2016   Procedure: Coronary Stent Intervention;  Surgeon: Nelva Bush, MD;  Location: [facility];  Service: Cardiovascular;  Laterality: N/A;  Mid CFX and MID RCA  . CARDIAC CATHETERIZATION N/A 07/30/2016   Procedure: Coronary Balloon Angioplasty;  Surgeon: Nelva Bush, MD;  Location: [facility];  Service: Cardiovascular;  Laterality: N/A;  OM 1  . CARPAL TUNNEL RELEASE Left   . CATARACT EXTRACTION W/ INTRAOCULAR LENS  IMPLANT, BILATERAL Bilateral ~ 2014  . CERVICAL DISC SURGERY  1990s  . CORONARY ANGIOPLASTY WITH STENT PLACEMENT  05/2013  . CORONARY ANGIOPLASTY WITH STENT PLACEMENT  07/26/2013   DES to RCA extending to PDA    . CORONARY ANGIOPLASTY WITH STENT PLACEMENT  07/30/2016  .  CORONARY ANGIOPLASTY WITH STENT PLACEMENT  2000   CAD  . DENTAL SURGERY  04/2016   "got infected; had to dig it out"  . EYE SURGERY    . KNEE ARTHROSCOPY Right 1990's   rt  . LEFT AND RIGHT HEART CATHETERIZATION WITH CORONARY ANGIOGRAM N/A 07/26/2013   Procedure: LEFT AND RIGHT HEART CATHETERIZATION WITH CORONARY ANGIOGRAM;  Surgeon: [name], MD;  Location: [facility] CATH LAB;  Service: Cardiovascular;  Laterality: N/A;  . LEFT HEART CATH AND CORONARY ANGIOGRAPHY N/A 02/26/2017   Procedure: Left Heart Cath and Coronary Angiography;  Surgeon: Sherren Mocha, MD;  Location: [facility];  Service: Cardiovascular;  Laterality: N/A;  . LEFT HEART CATHETERIZATION WITH CORONARY ANGIOGRAM N/A 05/19/2013   Procedure: LEFT HEART CATHETERIZATION WITH CORONARY ANGIOGRAM;  Surgeon: [name], MD;  Location: [facility] CATH LAB;  Service: Cardiovascular;  Laterality: N/A;  . LUMBAR LAMINECTOMY/DECOMPRESSION MICRODISCECTOMY  08/30/2012   Procedure: LUMBAR LAMINECTOMY/DECOMPRESSION MICRODISCECTOMY 2 LEVELS;  Surgeon: [name], MD;  Location: [facility];  Service: Neurosurgery;  Laterality: Bilateral;  Bilateral Lumbar three-four Lumbar four-five Laminotomies  . LUMBAR PERCUTANEOUS PEDICLE SCREW 1 LEVEL Bilateral 10/22/2017   Procedure: LUMBAR PERCUTANEOUS PEDICLE SCREW 1 LEVEL;  Surgeon: [name], MD;  Location: [facility];  Service: Neurosurgery;  Laterality: Bilateral;  . LUMBAR SPINE SURGERY  04/07/2016   Dr. Ellene Route; "?ruptured disc"  . MELANOMA EXCISION Left 02/2011   forearm  . NASAL SINUS SURGERY  1970s   "cut windows in sinus pockets"  . PERCUTANEOUS CORONARY STENT INTERVENTION (PCI-S)  05/19/2013   Procedure: PERCUTANEOUS CORONARY STENT INTERVENTION (PCI-S);  Surgeon: [name], MD;  Location: [facility] CATH LAB;  Service: Cardiovascular;;  . ULNAR TUNNEL RELEASE Left 04/07/2016   Dr. Ellene Route   Family History  Problem Relation Age of Onset  . Cancer Mother        intestinal   . Heart  disease Mother   . Cancer Father        prostate  . Migraines Daughter   . Leukemia Sister   . Heart disease Sister   . Prostate cancer Unknown   . Kidney cancer Unknown   . Cancer Unknown        Bladder cancer  .  Coronary artery disease Unknown   . Hypertension Sister   . Hypertension Brother   . Heart attack Neg Hx   . Stroke Neg Hx    Social History   Socioeconomic History  . Marital status: Married    Spouse name: Not on file  . Number of children: Not on file  . Years of education: Not on file  . Highest education level: Not on file  Occupational History  . Occupation: [employer]  . Financial resource strain: Not on file  . Food insecurity:    Worry: Not on file    Inability: Not on file  . Transportation needs:    Medical: Not on file    Non-medical: Not on file  Tobacco Use  . Smoking status: Former Smoker    Packs/day: 3.00    Years: 30.00    Pack years: 90.00    Types: Cigarettes    Last attempt to quit: 09/17/1996    Years since quitting: 21.9  . Smokeless tobacco: Never Used  Substance and Sexual Activity  . Alcohol use: No  . Drug use: No  . Sexual activity: Not Currently    Birth control/protection: None  Lifestyle  . Physical activity:    Days per week: Not on file    Minutes per session: Not on file  . Stress: Not on file  Relationships  . Social connections:    Talks on phone: Not on file    Gets together: Not on file    Attends religious service: Not on file    Active member of club or organization: Not on file    Attends meetings of clubs or organizations: Not on file    Relationship status: Not on file  Other Topics Concern  . Not on file  Social History Narrative   Married and lives locally with his wife.  Sharyon Cable.   Tobacco Counseling Counseling given: Not Answered   Clinical Intake: Pain : No/denies pain   Activities of Daily Living In your present state of health, do you have any difficulty performing the  following activities: 08/29/2018 10/20/2017  Hearing? N N  Vision? N N  Difficulty concentrating or making decisions? N N  Walking or climbing stairs? Y Y  Dressing or bathing? N N  Doing errands, shopping? N N  Preparing Food and eating ? N -  Using the Toilet? N -  In the past six months, have you accidently leaked urine? Y -  Comment wears pad -  Do you have problems with loss of bowel control? N -  Managing your Medications? N -  Managing your Finances? Y -  Comment wife -  Housekeeping or managing your Housekeeping? Y -  Comment has housekeeper. -  Some recent data might be hidden     Immunizations and Health Maintenance Immunization History  Administered Date(s) Administered  . Influenza Split 08/08/2012  . Influenza Whole 08/08/2009, 07/12/2010  . Influenza, High Dose Seasonal PF 08/02/2014, 07/11/2015, 08/26/2017, 08/01/2018  . Influenza,inj,Quad PF,6+ Mos 07/27/2013, 08/04/2016  . Pneumococcal Conjugate-13 08/02/2014  . Pneumococcal Polysaccharide-23 07/28/2005   Health Maintenance Due  Topic Date Due  . TETANUS/TDAP  03[birth year]13/1959  . URINE MICROALBUMIN  08/03/2015  . FOOT EXAM  05/21/2016  . OPHTHALMOLOGY EXAM  09/29/2016    Patient Care Team: [name], MD as PCP - General (Family Medicine) [name], MD (Neurosurgery) Stanford Breed Denice Bors, MD as Consulting Physician (Cardiology) Rutherford Guys, MD as Consulting Physician (Ophthalmology) [name],  MD as Consulting Physician (Internal Medicine)  Indicate any recent Medical Services you may have received from other than [facility] providers in the past year (date may be approximate).    Assessment:   This is a routine wellness examination for Benny. Physical assessment deferred to PCP.  Hearing/Vision screen  Visual Acuity Screening   Right eye Left eye Both eyes  Without correction: 20/20 20/20 20/20   With correction:     Hearing Screening Comments: Able to hear conversational tones w/o difficulty.  No issues reported.    Dietary issues and exercise activities discussed: Current Exercise Habits: The patient does not participate in regular exercise at present, Exercise limited by: orthopedic condition(s) Diet (meal preparation, eat out, water intake, caffeinated beverages, dairy products, fruits and vegetables): well balanced, on average, 3 meals per day   Goals    . Weight (lb) < 240 lb (108.9 kg)      Depression Screen PHQ 2/9 Scores 08/29/2018 08/26/2017 03/05/2016 11/05/2014  PHQ - 2 Score 0 0 0 0    Fall Risk Fall Risk  08/29/2018 08/26/2017 03/05/2016 11/05/2014  Falls in the past year? 0 No No No   Cognitive Function: MMSE - Mini Mental State Exam 08/29/2018 08/26/2017  Not completed: Refused -  Orientation to time - 5  Orientation to Place - 5  Registration - 3  Attention/ Calculation - 4  Recall - 2  Language- name 2 objects - 2  Language- repeat - 1  Language- follow 3 step command - 3  Language- read & follow direction - 1  Write a sentence - 1  Copy design - 1  Total score - 28        Screening Tests Health Maintenance  Topic Date Due  . TETANUS/TDAP  03/13/1960  . URINE MICROALBUMIN  08/03/2015  . FOOT EXAM  05/21/2016  . OPHTHALMOLOGY EXAM  09/29/2016  . HEMOGLOBIN A1C  11/10/2018  . INFLUENZA VACCINE  Completed  . PNA vac Low Risk Adult  Completed       Plan:    Please schedule your next medicare wellness visit with me in 1 yr.  Continue to eat heart healthy diet (full of fruits, vegetables, whole grains, lean protein, water--limit salt, fat, and sugar intake) and increase physical activity as tolerated.  Bring a copy of your living will and/or healthcare power of attorney to your next office visit.  Continue doing brain stimulating activities (puzzles, reading, adult coloring books, staying active) to keep memory sharp.    I have personally reviewed and noted the following in the patient's chart:   . Medical and social history . Use of  alcohol, tobacco or illicit drugs  . Current medications and supplements . Functional ability and status . Nutritional status . Physical activity . Advanced directives . List of other physicians . Hospitalizations, surgeries, and ER visits in previous 12 months . Vitals . Screenings to include cognitive, depression, and falls . Referrals and appointments  In addition, I have reviewed and discussed with patient certain preventive protocols, quality metrics, and best practice recommendations. A written personalized care plan for preventive services as well as general preventive health recommendations were provided to patient.     Shela Nevin, [place]   08/29/2018    Medical screening examination/treatment was performed by qualified clinical staff member and as supervising physician I was immediately available for consultation/collaboration. I have reviewed documentation and agree with assessment and plan.  Penni Homans, MD

## 2018-08-29 ENCOUNTER — Encounter: Payer: Self-pay | Admitting: *Deleted

## 2018-08-29 ENCOUNTER — Encounter: Payer: Self-pay | Admitting: Family Medicine

## 2018-08-29 ENCOUNTER — Ambulatory Visit (INDEPENDENT_AMBULATORY_CARE_PROVIDER_SITE_OTHER): Payer: Medicare HMO | Admitting: Family Medicine

## 2018-08-29 ENCOUNTER — Ambulatory Visit: Payer: Medicare HMO | Admitting: *Deleted

## 2018-08-29 VITALS — BP 116/56 | HR 64 | Temp 97.6°F | Resp 18 | Wt 261.0 lb

## 2018-08-29 VITALS — BP 116/56 | HR 64 | Ht 70.5 in | Wt 261.8 lb

## 2018-08-29 DIAGNOSIS — Z0000 Encounter for general adult medical examination without abnormal findings: Secondary | ICD-10-CM | POA: Diagnosis not present

## 2018-08-29 DIAGNOSIS — I10 Essential (primary) hypertension: Secondary | ICD-10-CM | POA: Diagnosis not present

## 2018-08-29 DIAGNOSIS — N390 Urinary tract infection, site not specified: Secondary | ICD-10-CM

## 2018-08-29 DIAGNOSIS — G4733 Obstructive sleep apnea (adult) (pediatric): Secondary | ICD-10-CM | POA: Diagnosis not present

## 2018-08-29 DIAGNOSIS — F329 Major depressive disorder, single episode, unspecified: Secondary | ICD-10-CM

## 2018-08-29 DIAGNOSIS — F32A Depression, unspecified: Secondary | ICD-10-CM

## 2018-08-29 DIAGNOSIS — G4730 Sleep apnea, unspecified: Secondary | ICD-10-CM

## 2018-08-29 NOTE — Assessment & Plan Note (Signed)
He continues to be stressed over his daughter's poor health and unfortunately she is in the hospital again. He declines any meds to help manage but may consider in future.

## 2018-08-29 NOTE — Progress Notes (Signed)
Subjective:    Patient ID: William Hendrix, male    DOB: [birth year], 77 y.o.   MRN: [id number]  No chief complaint on file.   HPI Patient is in today for follow up. He is sad due to his daughter being admitted to the hospital again. Endorses anhedonia but not suicidal ideation. He is feeling tired and achy still but is feeling better than he did when he presented to ED on 11/10. He is tolerating the Cephelexin given him by ER. Denies CP/palp/SOB/HA/congestion/fevers/GI c/o. Taking meds as prescribed  Past Medical History:  Diagnosis Date  . AAA (abdominal aortic aneurysm) ([place]) 12/15/2014   a. Mild aneurysmal dilatation of the infrarenal abdominal aorta 3.2 cm - f/u due by 2019.  . Arthritis    "all over" (07/30/2016)  . CAD (coronary artery disease)    a. stent to LAD 2000. b. possible spasm by cath 2001. c. IVUS/PTCA/DES to mLAD 05/2013. d. PTCA/DES of dRCA into ostial rPDA 07/2013. e. PTCA of OM2, DES to Cx, DES to [facility] 07/2016.  Marland Kitchen Chronic bronchitis ([place])   . Chronic diastolic CHF (congestive heart failure) ([place])   . Chronic lower back pain   . Chronic neck pain   . CKD (chronic kidney disease), stage III ([place])   . Diabetic peripheral neuropathy ([place])   . Ejection fraction    55%, 07/2010, mild inferior hypo  . GERD (gastroesophageal reflux disease)   . Gout   . H/O diverticulitis of colon 01/31/2015  . H/O hiatal hernia   . History of blood transfusion ~ 10/1940   "had pneumonia"  . History of kidney stones   . HTN (hypertension)   . Hyperlipidemia   . Melanoma of forearm, left ([place]) 02/2011   with wide excision   . Myocardial infarction ([place])   . Nephrolithiasis   . Obesity   . OSA on CPAP     [name] since 2000  . Peripheral neuropathy    both feet  . Positive D-dimer    a. significant elevation ,hospital 07/2010, etiology unclear. b. D Dimer chronically > 20.  Marland Kitchen Renal insufficiency   . Skin cancer   . Spinal stenosis    a. s/p surgical repair 2013  . Spinal  stenosis of lumbar region   . Type II diabetes mellitus ([place])   . Ventral hernia     Past Surgical History:  Procedure Laterality Date  . ANTERIOR LAT LUMBAR FUSION  10/22/2017   Procedure: Lumbar two-three Lumbar three-four Lumbar four-five Anteriolateral lumbar interbody fusion with percutaneous pedicle screw fixation and infuse;  Surgeon: [name], MD;  Location: [facility];  Service: Neurosurgery;;  . APPLICATION OF ROBOTIC ASSISTANCE FOR SPINAL PROCEDURE  10/22/2017   Procedure: APPLICATION OF ROBOTIC ASSISTANCE FOR SPINAL PROCEDURE;  Surgeon: [name], MD;  Location: [place] [facility];  Service: Neurosurgery;;  . BACK SURGERY    . CARDIAC CATHETERIZATION N/A 07/30/2016   Procedure: Left Heart Cath and Coronary Angiography;  Surgeon: Nelva Bush, MD;  Location: [place] CV LAB;  Service: Cardiovascular;  Laterality: N/A;  . CARDIAC CATHETERIZATION N/A 07/30/2016   Procedure: Coronary Stent Intervention;  Surgeon: Nelva Bush, MD;  Location: [place] CV LAB;  Service: Cardiovascular;  Laterality: N/A;  Mid CFX and MID RCA  . CARDIAC CATHETERIZATION N/A 07/30/2016   Procedure: Coronary Balloon Angioplasty;  Surgeon: Nelva Bush, MD;  Location: [place] CV LAB;  Service: Cardiovascular;  Laterality: N/A;  OM 1  . CARPAL TUNNEL RELEASE Left   .  CATARACT EXTRACTION W/ INTRAOCULAR LENS  IMPLANT, BILATERAL Bilateral ~ 2014  . CERVICAL DISC SURGERY  1990s  . CORONARY ANGIOPLASTY WITH STENT PLACEMENT  05/2013  . CORONARY ANGIOPLASTY WITH STENT PLACEMENT  07/26/2013   DES to RCA extending to PDA    . CORONARY ANGIOPLASTY WITH STENT PLACEMENT  07/30/2016  . CORONARY ANGIOPLASTY WITH STENT PLACEMENT  2000   CAD  . DENTAL SURGERY  04/2016   "got infected; had to dig it out"  . EYE SURGERY    . KNEE ARTHROSCOPY Right 1990's   rt  . LEFT AND RIGHT HEART CATHETERIZATION WITH CORONARY ANGIOGRAM N/A 07/26/2013   Procedure: LEFT AND RIGHT HEART CATHETERIZATION WITH CORONARY ANGIOGRAM;   Surgeon: [name], MD;  Location: [place] [facility] CATH LAB;  Service: Cardiovascular;  Laterality: N/A;  . LEFT HEART CATH AND CORONARY ANGIOGRAPHY N/A 02/26/2017   Procedure: Left Heart Cath and Coronary Angiography;  Surgeon: Sherren Mocha, MD;  Location: [place] CV LAB;  Service: Cardiovascular;  Laterality: N/A;  . LEFT HEART CATHETERIZATION WITH CORONARY ANGIOGRAM N/A 05/19/2013   Procedure: LEFT HEART CATHETERIZATION WITH CORONARY ANGIOGRAM;  Surgeon: [name], MD;  Location: [facility] CATH LAB;  Service: Cardiovascular;  Laterality: N/A;  . LUMBAR LAMINECTOMY/DECOMPRESSION MICRODISCECTOMY  08/30/2012   Procedure: LUMBAR LAMINECTOMY/DECOMPRESSION MICRODISCECTOMY 2 LEVELS;  Surgeon: [name], MD;  Location: [place] NEURO ORS;  Service: Neurosurgery;  Laterality: Bilateral;  Bilateral Lumbar three-four Lumbar four-five Laminotomies  . LUMBAR PERCUTANEOUS PEDICLE SCREW 1 LEVEL Bilateral 10/22/2017   Procedure: LUMBAR PERCUTANEOUS PEDICLE SCREW 1 LEVEL;  Surgeon: [name], MD;  Location: [place];  Service: Neurosurgery;  Laterality: Bilateral;  . LUMBAR SPINE SURGERY  04/07/2016   Dr. Ellene Route; "?ruptured disc"  . MELANOMA EXCISION Left 02/2011   forearm  . NASAL SINUS SURGERY  1970s   "cut windows in sinus pockets"  . PERCUTANEOUS CORONARY STENT INTERVENTION (PCI-S)  05/19/2013   Procedure: PERCUTANEOUS CORONARY STENT INTERVENTION (PCI-S);  Surgeon: [name], MD;  Location: [facility] CATH LAB;  Service: Cardiovascular;;  . ULNAR TUNNEL RELEASE Left 04/07/2016   Dr. Ellene Route    Family History  Problem Relation Age of Onset  . Cancer Mother        intestinal   . Heart disease Mother   . Cancer Father        prostate  . Migraines Daughter   . Leukemia Sister   . Heart disease Sister   . Prostate cancer Unknown   . Kidney cancer Unknown   . Cancer Unknown        Bladder cancer  . Coronary artery disease Unknown   . Hypertension Sister   . Hypertension Brother   . Heart attack Neg Hx     . Stroke Neg Hx     Social History   Socioeconomic History  . Marital status: Married    Spouse name: Not on file  . Number of children: Not on file  . Years of education: Not on file  . Highest education level: Not on file  Occupational History  . Occupation: [employer]  . Financial resource strain: Not on file  . Food insecurity:    Worry: Not on file    Inability: Not on file  . Transportation needs:    Medical: Not on file    Non-medical: Not on file  Tobacco Use  . Smoking status: Former Smoker    Packs/day: 3.00    Years: 30.00    Pack years: 90.00  Types: Cigarettes    Last attempt to quit: 09/17/1996    Years since quitting: 21.9  . Smokeless tobacco: Never Used  Substance and Sexual Activity  . Alcohol use: No  . Drug use: No  . Sexual activity: Not Currently    Birth control/protection: None  Lifestyle  . Physical activity:    Days per week: Not on file    Minutes per session: Not on file  . Stress: Not on file  Relationships  . Social connections:    Talks on phone: Not on file    Gets together: Not on file    Attends religious service: Not on file    Active member of club or organization: Not on file    Attends meetings of clubs or organizations: Not on file    Relationship status: Not on file  . Intimate partner violence:    Fear of current or ex partner: Not on file    Emotionally abused: Not on file    Physically abused: Not on file    Forced sexual activity: Not on file  Other Topics Concern  . Not on file  Social History Narrative   Married and lives locally with his wife.  Sharyon Cable.    Outpatient Medications Prior to Visit  Medication Sig Dispense Refill  . acetaminophen (TYLENOL) 500 MG tablet Take 1,000 mg by mouth 2 (two) times daily.    Marland Kitchen albuterol (PROVENTIL HFA;VENTOLIN HFA) 108 (90 Base) MCG/ACT inhaler Inhale 2 puffs into the lungs every 6 (six) hours as needed for wheezing or shortness of breath. (Patient not  taking: Reported on 08/29/2018) 1 Inhaler 0  . allopurinol (ZYLOPRIM) 300 MG tablet Take 1 tablet (300 mg total) by mouth daily. 90 tablet 1  . aspirin EC 81 MG tablet Take 81 mg by mouth daily.    Marland Kitchen atorvastatin (LIPITOR) 40 MG tablet Take 1 tablet (40 mg total) by mouth daily. 90 tablet 1  . B Complex Vitamins (B COMPLEX-B12 PO) Take 1 tablet by mouth at bedtime.    . BD INSULIN SYRINGE ULTRAFINE 31G X 5/16" 1 ML MISC USE  TO INJECT  INSULIN  FIVE  TIMES  DAILY AS INSTRUCTED 450 each 3  . cephALEXin (KEFLEX) 500 MG capsule Take 1 capsule (500 mg total) by mouth 4 (four) times daily for 14 days. 54 capsule 0  . chlorpheniramine (CHLOR-TRIMETON) 4 MG tablet Take 4 mg by mouth daily.    . Cholecalciferol (VITAMIN D) 1000 UNITS capsule Take 1,000 Units by mouth at bedtime.     . fenofibrate 160 MG tablet Take 1 tablet (160 mg total) by mouth at bedtime. 90 tablet 1  . fluocinonide cream (LIDEX) 9.92 % Apply 1 application topically daily as needed (for rash).     . fluticasone (FLONASE) 50 MCG/ACT nasal spray Place 2 sprays into both nostrils daily. 16 g 1  . furosemide (LASIX) 40 MG tablet TAKE 1 AND 1/2 TABLETS EVERY DAY (Patient taking differently: Take 60 mg by mouth every day) 135 tablet 2  . glucose blood (TRUE METRIX BLOOD GLUCOSE TEST) test strip USE TO TEST BLOOD SUGAR THREE TIMES DAILY AS DIRECTED 300 each 3  . HYDROcodone-acetaminophen (NORCO/VICODIN) 5-325 MG tablet Take 1-2 tablets by mouth every 4 (four) hours as needed for severe pain ((score 7 to 10)). 60 tablet 0  . insulin NPH Human (NOVOLIN N) 100 UNIT/ML injection Inject 45 units in am and 40 in the evening (Patient taking differently: Inject 45 units in am  and 50 in the evening) 30 mL 3  . insulin regular (NOVOLIN R RELION) 100 units/mL injection Inject 0.25-0.3 mLs (25-30 Units total) into the skin 3 (three) times daily before meals. (Patient taking differently: Inject 35 Units into the skin 3 (three) times daily before meals. ) 70  mL 3  . isosorbide mononitrate (IMDUR) 30 MG 24 hr tablet Take 1 tablet (30 mg total) by mouth daily. 30 tablet 0  . metoprolol tartrate (LOPRESSOR) 50 MG tablet Take 1 tablet (50 mg total) by mouth 2 (two) times daily. 180 tablet 1  . mupirocin ointment (BACTROBAN) 2 % Apply to area thin film twice daily 22 g 0  . nitroGLYCERIN (NITROSTAT) 0.4 MG SL tablet Place 1 tablet (0.4 mg total) under the tongue every 5 (five) minutes as needed for chest pain. 100 tablet 3  . pantoprazole (PROTONIX) 20 MG tablet Take 1 tablet (20 mg total) by mouth daily. 90 tablet 3  . Probiotic Product (PROBIOTIC PO) Take 1 capsule by mouth daily.    . tamsulosin (FLOMAX) 0.4 MG CAPS capsule Take 1 capsule (0.4 mg total) by mouth daily. 90 capsule 1  . tiZANidine (ZANAFLEX) 4 MG capsule Take 1 capsule (4 mg total) by mouth 4 (four) times daily as needed for muscle spasms. Take 1 tablet every 6 hours as needed; not to exceed 3 doses in 24 hours. 40 capsule 2   No facility-administered medications prior to visit.     Allergies  Allergen Reactions  . Levemir [Insulin Detemir] Swelling and Other (See Comments)    Patient had redness and swelling and tenderness at injection site.  . Codeine Other (See Comments)    Makes him "shakey", is OK with hydrocodone GI UPSET & TREMORS  . Lipitor [Atorvastatin] Other (See Comments)    Muscle aches; liver functions. Patient is currently taken  . Novolog Mix [Insulin Aspart Prot & Aspart] Other (See Comments)    Causes skin to swell/itch at injection site  . Other Other (See Comments)    Brilinta caused SOB    Review of Systems  Constitutional: Positive for chills and malaise/fatigue. Negative for fever.  HENT: Negative for congestion.   Eyes: Negative for blurred vision.  Respiratory: Negative for shortness of breath.   Cardiovascular: Negative for chest pain, palpitations and leg swelling.  Gastrointestinal: Negative for abdominal pain, blood in stool and nausea.   Genitourinary: Positive for frequency. Negative for dysuria.  Musculoskeletal: Positive for myalgias. Negative for falls.  Skin: Negative for rash.  Neurological: Negative for dizziness, loss of consciousness and headaches.  Endo/Heme/Allergies: Negative for environmental allergies.  Psychiatric/Behavioral: Negative for depression. The patient is not nervous/anxious.        Objective:    Physical Exam  Constitutional: He is oriented to person, place, and time. He appears well-developed and well-nourished. No distress.  HENT:  Head: Normocephalic and atraumatic.  Nose: Nose normal.  Eyes: Right eye exhibits no discharge. Left eye exhibits no discharge.  Neck: Normal range of motion. Neck supple.  Cardiovascular: Normal rate and regular rhythm.  No murmur heard. Pulmonary/Chest: Effort normal and breath sounds normal.  Abdominal: Soft. Bowel sounds are normal. There is no tenderness.  Musculoskeletal: He exhibits no edema.  Neurological: He is alert and oriented to person, place, and time.  Skin: Skin is warm and dry.  Psychiatric: He has a normal mood and affect.  Nursing note and vitals reviewed.   BP (!) 116/56 (BP Location: Left Arm, Patient Position: Sitting, Cuff Size:  Normal)   Pulse 64   Temp 97.6 F (36.4 C) (Oral)   Resp 18   Wt 261 lb (118.4 kg)   SpO2 97%   BMI 36.92 kg/m  Wt Readings from Last 3 Encounters:  08/29/18 261 lb (118.4 kg)  08/29/18 261 lb 12.8 oz (118.8 kg)  08/01/18 251 lb (113.9 kg)     Lab Results  Component Value Date   WBC 7.0 08/21/2018   HGB 11.5 (L) 08/21/2018   HCT 34.6 (L) 08/21/2018   PLT 149 (L) 08/21/2018   GLUCOSE 122 (H) 08/21/2018   CHOL 135 01/24/2018   TRIG 286.0 (H) 01/24/2018   HDL 25.20 (L) 01/24/2018   LDLDIRECT 69.0 01/24/2018   LDLCALC 31 04/09/2014   ALT 17 08/21/2018   AST 20 08/21/2018   NA 137 08/21/2018   K 3.5 08/21/2018   CL 100 08/21/2018   CREATININE 1.60 (H) 08/21/2018   BUN 36 (H) 08/21/2018    CO2 28 08/21/2018   TSH 3.39 01/24/2018   INR 1.1 02/24/2017   HGBA1C 6.1 (A) 05/10/2018   MICROALBUR 0.7 08/02/2014    Lab Results  Component Value Date   TSH 3.39 01/24/2018   Lab Results  Component Value Date   WBC 7.0 08/21/2018   HGB 11.5 (L) 08/21/2018   HCT 34.6 (L) 08/21/2018   MCV 96.1 08/21/2018   PLT 149 (L) 08/21/2018   Lab Results  Component Value Date   NA 137 08/21/2018   K 3.5 08/21/2018   CO2 28 08/21/2018   GLUCOSE 122 (H) 08/21/2018   BUN 36 (H) 08/21/2018   CREATININE 1.60 (H) 08/21/2018   BILITOT 0.7 08/21/2018   ALKPHOS 39 08/21/2018   AST 20 08/21/2018   ALT 17 08/21/2018   PROT 6.9 08/21/2018   ALBUMIN 3.4 (L) 08/21/2018   CALCIUM 9.8 08/21/2018   ANIONGAP 9 08/21/2018   GFR 45.93 (L) 08/01/2018   Lab Results  Component Value Date   CHOL 135 01/24/2018   Lab Results  Component Value Date   HDL 25.20 (L) 01/24/2018   Lab Results  Component Value Date   LDLCALC 31 04/09/2014   Lab Results  Component Value Date   TRIG 286.0 (H) 01/24/2018   Lab Results  Component Value Date   CHOLHDL 5 01/24/2018   Lab Results  Component Value Date   HGBA1C 6.1 (A) 05/10/2018       Assessment & Plan:   Problem List Items Addressed This Visit    Depression    He continues to be stressed over his daughter's poor health and unfortunately she is in the hospital again. He declines any meds to help manage but may consider in future.      Essential hypertension    Well controlled, no changes to meds. Encouraged heart healthy diet such as the DASH diet and exercise as tolerated.       OSA (obstructive sleep apnea)    Requests follow up with pulmonology referral placed, uses CPAP nightly      UTI (urinary tract infection)    Is currently on Cephelexin after presenting to ER with urinary symptoms and having a suspicious urine dip. Urine culture just showed contaminant but he reports his symptoms are improved so he will finish course and we will  repeat UA with culture today      Relevant Orders   Urinalysis   Urine Culture    Other Visit Diagnoses    Sleep apnea, unspecified type    -  Primary   Relevant Orders   Ambulatory referral to Pulmonology      I am having William Hendrix "[name]" maintain his Vitamin D, fluocinonide cream, aspirin EC, acetaminophen, B Complex Vitamins (B COMPLEX-B12 PO), albuterol, tiZANidine, BD INSULIN SYRINGE ULTRAFINE, furosemide, fluticasone, insulin regular, mupirocin ointment, chlorpheniramine, Probiotic Product (PROBIOTIC PO), HYDROcodone-acetaminophen, nitroGLYCERIN, insulin NPH Human, isosorbide mononitrate, metoprolol tartrate, fenofibrate, allopurinol, atorvastatin, tamsulosin, pantoprazole, glucose blood, and cephALEXin.  No orders of the defined types were placed in this encounter.    Penni Homans, MD

## 2018-08-29 NOTE — Assessment & Plan Note (Signed)
Well controlled, no changes to meds. Encouraged heart healthy diet such as the DASH diet and exercise as tolerated.  °

## 2018-08-29 NOTE — Patient Instructions (Signed)
Please schedule your next medicare wellness visit with me in 1 yr.  Continue to eat heart healthy diet (full of fruits, vegetables, whole grains, lean protein, water--limit salt, fat, and sugar intake) and increase physical activity as tolerated.  Bring a copy of your living will and/or healthcare power of attorney to your next office visit.  Continue doing brain stimulating activities (puzzles, reading, adult coloring books, staying active) to keep memory sharp.    William Hendrix , Thank you for taking time to come for your Medicare Wellness Visit. I appreciate your ongoing commitment to your health goals. Please review the following plan we discussed and let me know if I can assist you in the future.   These are the goals we discussed: Goals    . Weight (lb) < 240 lb (108.9 kg)       This is a list of the screening recommended for you and due dates:  Health Maintenance  Topic Date Due  . Tetanus Vaccine  03/13/1960  . Urine Protein Check  08/03/2015  . Complete foot exam   05/21/2016  . Eye exam for diabetics  09/29/2016  . Hemoglobin A1C  11/10/2018  . Flu Shot  Completed  . Pneumonia vaccines  Completed    Health Maintenance, Male A healthy lifestyle and preventive care is important for your health and wellness. Ask your health care provider about what schedule of regular examinations is right for you. What should I know about weight and diet? Eat a Healthy Diet  Eat plenty of vegetables, fruits, whole grains, low-fat dairy products, and lean protein.  Do not eat a lot of foods high in solid fats, added sugars, or salt.  Maintain a Healthy Weight Regular exercise can help you achieve or maintain a healthy weight. You should:  Do at least 150 minutes of exercise each week. The exercise should increase your heart rate and make you sweat (moderate-intensity exercise).  Do strength-training exercises at least twice a week.  Watch Your Levels of Cholesterol and Blood  Lipids  Have your blood tested for lipids and cholesterol every 5 years starting at 78 years of age. If you are at high risk for heart disease, you should start having your blood tested when you are 78 years old. You may need to have your cholesterol levels checked more often if: ? Your lipid or cholesterol levels are high. ? You are older than 78 years of age. ? You are at high risk for heart disease.  What should I know about cancer screening? Many types of cancers can be detected early and may often be prevented. Lung Cancer  You should be screened every year for lung cancer if: ? You are a current smoker who has smoked for at least 30 years. ? You are a former smoker who has quit within the past 15 years.  Talk to your health care provider about your screening options, when you should start screening, and how often you should be screened.  Colorectal Cancer  Routine colorectal cancer screening usually begins at 78 years of age and should be repeated every 5-10 years until you are 78 years old. You may need to be screened more often if early forms of precancerous polyps or small growths are found. Your health care provider may recommend screening at an earlier age if you have risk factors for colon cancer.  Your health care provider may recommend using home test kits to check for hidden blood in the stool.  A small  camera at the end of a tube can be used to examine your colon (sigmoidoscopy or colonoscopy). This checks for the earliest forms of colorectal cancer.  Prostate and Testicular Cancer  Depending on your age and overall health, your health care provider may do certain tests to screen for prostate and testicular cancer.  Talk to your health care provider about any symptoms or concerns you have about testicular or prostate cancer.  Skin Cancer  Check your skin from head to toe regularly.  Tell your health care provider about any new moles or changes in moles, especially  if: ? There is a change in a mole's size, shape, or color. ? You have a mole that is larger than a pencil eraser.  Always use sunscreen. Apply sunscreen liberally and repeat throughout the day.  Protect yourself by wearing long sleeves, pants, a wide-brimmed hat, and sunglasses when outside.  What should I know about heart disease, diabetes, and high blood pressure?  If you are 65-91 years of age, have your blood pressure checked every 3-5 years. If you are 69 years of age or older, have your blood pressure checked every year. You should have your blood pressure measured twice-once when you are at a hospital or clinic, and once when you are not at a hospital or clinic. Record the average of the two measurements. To check your blood pressure when you are not at a hospital or clinic, you can use: ? An automated blood pressure machine at a pharmacy. ? A home blood pressure monitor.  Talk to your health care provider about your target blood pressure.  If you are between 75-56 years old, ask your health care provider if you should take aspirin to prevent heart disease.  Have regular diabetes screenings by checking your fasting blood sugar level. ? If you are at a normal weight and have a low risk for diabetes, have this test once every three years after the age of 63. ? If you are overweight and have a high risk for diabetes, consider being tested at a younger age or more often.  A one-time screening for abdominal aortic aneurysm (AAA) by ultrasound is recommended for men aged 43-75 years who are current or former smokers. What should I know about preventing infection? Hepatitis B If you have a higher risk for hepatitis B, you should be screened for this virus. Talk with your health care provider to find out if you are at risk for hepatitis B infection. Hepatitis C Blood testing is recommended for:  Everyone born from [birth year] through 1966.  Anyone with known risk factors for hepatitis  C.  Sexually Transmitted Diseases (STDs)  You should be screened each year for STDs including gonorrhea and chlamydia if: ? You are sexually active and are younger than 78 years of age. ? You are older than 78 years of age and your health care provider tells you that you are at risk for this type of infection. ? Your sexual activity has changed since you were last screened and you are at an increased risk for chlamydia or gonorrhea. Ask your health care provider if you are at risk.  Talk with your health care provider about whether you are at high risk of being infected with HIV. Your health care provider may recommend a prescription medicine to help prevent HIV infection.  What else can I do?  Schedule regular health, dental, and eye exams.  Stay current with your vaccines (immunizations).  Do not use any tobacco  products, such as cigarettes, chewing tobacco, and e-cigarettes. If you need help quitting, ask your health care provider.  Limit alcohol intake to no more than 2 drinks per day. One drink equals 12 ounces of beer, 5 ounces of wine, or 1 ounces of hard liquor.  Do not use street drugs.  Do not share needles.  Ask your health care provider for help if you need support or information about quitting drugs.  Tell your health care provider if you often feel depressed.  Tell your health care provider if you have ever been abused or do not feel safe at home. This information is not intended to replace advice given to you by your health care provider. Make sure you discuss any questions you have with your health care provider. Document Released: 03/26/2008 Document Revised: 05/27/2016 Document Reviewed: 07/02/2015 Elsevier Interactive Patient Education  [employer].

## 2018-08-29 NOTE — Assessment & Plan Note (Signed)
Requests follow up with pulmonology referral placed, uses CPAP nightly

## 2018-08-29 NOTE — Assessment & Plan Note (Addendum)
Is currently on Cephelexin after presenting to ER with urinary symptoms and having a suspicious urine dip. Urine culture just showed contaminant but he reports his symptoms are improved so he will finish course and we will repeat UA with culture today

## 2018-08-29 NOTE — Patient Instructions (Signed)
Urinary Tract Infection, Adult  A urinary tract infection (UTI) is an infection of any part of the urinary tract, which includes the kidneys, ureters, bladder, and urethra. These organs make, store, and get rid of urine in the body. UTI can be a bladder infection (cystitis) or kidney infection (pyelonephritis).  What are the causes?  This infection may be caused by fungi, viruses, or bacteria. Bacteria are the most common cause of UTIs. This condition can also be caused by repeated incomplete emptying of the bladder during urination.  What increases the risk?  This condition is more likely to develop if:   You ignore your need to urinate or hold urine for long periods of time.   You do not empty your bladder completely during urination.   You wipe back to front after urinating or having a bowel movement, if you are male.   You are uncircumcised, if you are male.   You are constipated.   You have a urinary catheter that stays in place (indwelling).   You have a weak defense (immune) system.   You have a medical condition that affects your bowels, kidneys, or bladder.   You have diabetes.   You take antibiotic medicines frequently or for long periods of time, and the antibiotics no longer work well against certain types of infections (antibiotic resistance).   You take medicines that irritate your urinary tract.   You are exposed to chemicals that irritate your urinary tract.   You are male.    What are the signs or symptoms?  Symptoms of this condition include:   Fever.   Frequent urination or passing small amounts of urine frequently.   Needing to urinate urgently.   Pain or burning with urination.   Urine that smells bad or unusual.   Cloudy urine.   Pain in the lower abdomen or back.   Trouble urinating.   Blood in the urine.   Vomiting or being less hungry than normal.   Diarrhea or abdominal pain.   Vaginal discharge, if you are male.    How is this diagnosed?  This condition is  diagnosed with a medical history and physical exam. You will also need to provide a urine sample to test your urine. Other tests may be done, including:   Blood tests.   Sexually transmitted disease (STD) testing.    If you have had more than one UTI, a cystoscopy or imaging studies may be done to determine the cause of the infections.  How is this treated?  Treatment for this condition often includes a combination of two or more of the following:   Antibiotic medicine.   Other medicines to treat less common causes of UTI.   Over-the-counter medicines to treat pain.   Drinking enough water to stay hydrated.    Follow these instructions at home:   Take over-the-counter and prescription medicines only as told by your health care provider.   If you were prescribed an antibiotic, take it as told by your health care provider. Do not stop taking the antibiotic even if you start to feel better.   Avoid alcohol, caffeine, tea, and carbonated beverages. They can irritate your bladder.   Drink enough fluid to keep your urine clear or pale yellow.   Keep all follow-up visits as told by your health care provider. This is important.   Make sure to:  ? Empty your bladder often and completely. Do not hold urine for long periods of time.  ?   Empty your bladder before and after sex.  ? Wipe from front to back after a bowel movement if you are male. Use each tissue one time when you wipe.  Contact a health care provider if:   You have back pain.   You have a fever.   You feel nauseous or vomit.   Your symptoms do not get better after 3 days.   Your symptoms go away and then return.  Get help right away if:   You have severe back pain or lower abdominal pain.   You are vomiting and cannot keep down any medicines or water.  This information is not intended to replace advice given to you by your health care provider. Make sure you discuss any questions you have with your health care provider.  Document Released:  07/08/2005 Document Revised: 03/11/2016 Document Reviewed: 08/19/2015  Elsevier Interactive Patient Education  2018 [employer] Inc[employer]

## 2018-08-30 LAB — URINALYSIS
BILIRUBIN URINE: NEGATIVE
HGB URINE DIPSTICK: NEGATIVE
Ketones, ur: NEGATIVE
LEUKOCYTES UA: NEGATIVE
NITRITE: NEGATIVE
Specific Gravity, Urine: 1.01 (ref 1.000–1.030)
TOTAL PROTEIN, URINE-UPE24: NEGATIVE
UROBILINOGEN UA: 0.2 (ref 0.0–1.0)
Urine Glucose: NEGATIVE
pH: 5 (ref 5.0–8.0)

## 2018-08-31 LAB — URINE CULTURE
MICRO NUMBER:: 91385808
SPECIMEN QUALITY: ADEQUATE

## 2018-09-01 ENCOUNTER — Other Ambulatory Visit: Payer: Self-pay

## 2018-09-01 DIAGNOSIS — I10 Essential (primary) hypertension: Secondary | ICD-10-CM

## 2018-09-01 MED ORDER — LEVOFLOXACIN 250 MG PO TABS: 250.0000 mg | ORAL_TABLET | ORAL | 0 refills | Status: DC

## 2018-09-01 NOTE — Progress Notes (Signed)
c 

## 2018-09-12 ENCOUNTER — Other Ambulatory Visit (INDEPENDENT_AMBULATORY_CARE_PROVIDER_SITE_OTHER): Payer: Medicare HMO

## 2018-09-12 DIAGNOSIS — I10 Essential (primary) hypertension: Secondary | ICD-10-CM | POA: Diagnosis not present

## 2018-09-12 LAB — COMPREHENSIVE METABOLIC PANEL
ALT: 18 U/L (ref 0–53)
AST: 17 U/L (ref 0–37)
Albumin: 4.1 g/dL (ref 3.5–5.2)
Alkaline Phosphatase: 49 U/L (ref 39–117)
BUN: 33 mg/dL — ABNORMAL HIGH (ref 6–23)
CO2: 31 mEq/L (ref 19–32)
Calcium: 10.6 mg/dL — ABNORMAL HIGH (ref 8.4–10.5)
Chloride: 104 mEq/L (ref 96–112)
Creatinine, Ser: 1.66 mg/dL — ABNORMAL HIGH (ref 0.40–1.50)
GFR: 42.74 mL/min — ABNORMAL LOW (ref >60.00)
Glucose, Bld: 144 mg/dL — ABNORMAL HIGH (ref 70–99)
Potassium: 4.6 mEq/L (ref 3.5–5.1)
Sodium: 145 mEq/L (ref 135–145)
Total Bilirubin: 0.5 mg/dL (ref 0.2–1.2)
Total Protein: 7 g/dL (ref 6.0–8.3)

## 2018-09-14 ENCOUNTER — Inpatient Hospital Stay (HOSPITAL_BASED_OUTPATIENT_CLINIC_OR_DEPARTMENT_OTHER)
Admission: EM | Admit: 2018-09-14 | Discharge: 2018-09-16 | DRG: 291 | Disposition: A | Discharge status: Home / Self Care | Payer: Medicare HMO | Attending: Family Medicine | Admitting: Family Medicine

## 2018-09-14 ENCOUNTER — Other Ambulatory Visit: Payer: Self-pay

## 2018-09-14 ENCOUNTER — Encounter (HOSPITAL_BASED_OUTPATIENT_CLINIC_OR_DEPARTMENT_OTHER): Payer: Self-pay | Admitting: Emergency Medicine

## 2018-09-14 ENCOUNTER — Emergency Department (HOSPITAL_BASED_OUTPATIENT_CLINIC_OR_DEPARTMENT_OTHER): Payer: Medicare HMO

## 2018-09-14 DIAGNOSIS — G8929 Other chronic pain: Secondary | ICD-10-CM | POA: Diagnosis present

## 2018-09-14 DIAGNOSIS — Z87442 Personal history of urinary calculi: Secondary | ICD-10-CM

## 2018-09-14 DIAGNOSIS — I714 Abdominal aortic aneurysm, without rupture: Secondary | ICD-10-CM | POA: Diagnosis present

## 2018-09-14 DIAGNOSIS — Z8052 Family history of malignant neoplasm of bladder: Secondary | ICD-10-CM

## 2018-09-14 DIAGNOSIS — Z955 Presence of coronary angioplasty implant and graft: Secondary | ICD-10-CM | POA: Diagnosis not present

## 2018-09-14 DIAGNOSIS — Z981 Arthrodesis status: Secondary | ICD-10-CM | POA: Diagnosis not present

## 2018-09-14 DIAGNOSIS — I249 Acute ischemic heart disease, unspecified: Secondary | ICD-10-CM | POA: Diagnosis not present

## 2018-09-14 DIAGNOSIS — I444 Left anterior fascicular block: Secondary | ICD-10-CM | POA: Diagnosis present

## 2018-09-14 DIAGNOSIS — R0789 Other chest pain: Secondary | ICD-10-CM | POA: Diagnosis not present

## 2018-09-14 DIAGNOSIS — I361 Nonrheumatic tricuspid (valve) insufficiency: Secondary | ICD-10-CM | POA: Diagnosis not present

## 2018-09-14 DIAGNOSIS — I5043 Acute on chronic combined systolic (congestive) and diastolic (congestive) heart failure: Secondary | ICD-10-CM | POA: Diagnosis present

## 2018-09-14 DIAGNOSIS — Z9841 Cataract extraction status, right eye: Secondary | ICD-10-CM

## 2018-09-14 DIAGNOSIS — I2510 Atherosclerotic heart disease of native coronary artery without angina pectoris: Secondary | ICD-10-CM | POA: Diagnosis present

## 2018-09-14 DIAGNOSIS — R072 Precordial pain: Secondary | ICD-10-CM | POA: Diagnosis not present

## 2018-09-14 DIAGNOSIS — J42 Unspecified chronic bronchitis: Secondary | ICD-10-CM | POA: Diagnosis present

## 2018-09-14 DIAGNOSIS — M545 Low back pain: Secondary | ICD-10-CM | POA: Diagnosis present

## 2018-09-14 DIAGNOSIS — Z79899 Other long term (current) drug therapy: Secondary | ICD-10-CM

## 2018-09-14 DIAGNOSIS — Z794 Long term (current) use of insulin: Secondary | ICD-10-CM

## 2018-09-14 DIAGNOSIS — I200 Unstable angina: Secondary | ICD-10-CM | POA: Diagnosis not present

## 2018-09-14 DIAGNOSIS — E785 Hyperlipidemia, unspecified: Secondary | ICD-10-CM | POA: Diagnosis not present

## 2018-09-14 DIAGNOSIS — I370 Nonrheumatic pulmonary valve stenosis: Secondary | ICD-10-CM | POA: Diagnosis not present

## 2018-09-14 DIAGNOSIS — Z885 Allergy status to narcotic agent status: Secondary | ICD-10-CM

## 2018-09-14 DIAGNOSIS — Z888 Allergy status to other drugs, medicaments and biological substances status: Secondary | ICD-10-CM | POA: Diagnosis not present

## 2018-09-14 DIAGNOSIS — Z9842 Cataract extraction status, left eye: Secondary | ICD-10-CM

## 2018-09-14 DIAGNOSIS — Z6836 Body mass index (BMI) 36.0-36.9, adult: Secondary | ICD-10-CM

## 2018-09-14 DIAGNOSIS — M1000 Idiopathic gout, unspecified site: Secondary | ICD-10-CM | POA: Diagnosis not present

## 2018-09-14 DIAGNOSIS — M1990 Unspecified osteoarthritis, unspecified site: Secondary | ICD-10-CM | POA: Diagnosis present

## 2018-09-14 DIAGNOSIS — M542 Cervicalgia: Secondary | ICD-10-CM | POA: Diagnosis not present

## 2018-09-14 DIAGNOSIS — G4733 Obstructive sleep apnea (adult) (pediatric): Secondary | ICD-10-CM | POA: Diagnosis present

## 2018-09-14 DIAGNOSIS — K219 Gastro-esophageal reflux disease without esophagitis: Secondary | ICD-10-CM | POA: Diagnosis not present

## 2018-09-14 DIAGNOSIS — I5031 Acute diastolic (congestive) heart failure: Secondary | ICD-10-CM | POA: Diagnosis not present

## 2018-09-14 DIAGNOSIS — Z8249 Family history of ischemic heart disease and other diseases of the circulatory system: Secondary | ICD-10-CM

## 2018-09-14 DIAGNOSIS — Z806 Family history of leukemia: Secondary | ICD-10-CM

## 2018-09-14 DIAGNOSIS — I252 Old myocardial infarction: Secondary | ICD-10-CM | POA: Diagnosis not present

## 2018-09-14 DIAGNOSIS — I130 Hypertensive heart and chronic kidney disease with heart failure and stage 1 through stage 4 chronic kidney disease, or unspecified chronic kidney disease: Principal | ICD-10-CM | POA: Diagnosis present

## 2018-09-14 DIAGNOSIS — E1142 Type 2 diabetes mellitus with diabetic polyneuropathy: Secondary | ICD-10-CM | POA: Diagnosis not present

## 2018-09-14 DIAGNOSIS — Z7982 Long term (current) use of aspirin: Secondary | ICD-10-CM

## 2018-09-14 DIAGNOSIS — E1140 Type 2 diabetes mellitus with diabetic neuropathy, unspecified: Secondary | ICD-10-CM | POA: Diagnosis not present

## 2018-09-14 DIAGNOSIS — N183 Chronic kidney disease, stage 3 (moderate): Secondary | ICD-10-CM | POA: Diagnosis not present

## 2018-09-14 DIAGNOSIS — Z961 Presence of intraocular lens: Secondary | ICD-10-CM | POA: Diagnosis present

## 2018-09-14 DIAGNOSIS — I259 Chronic ischemic heart disease, unspecified: Secondary | ICD-10-CM | POA: Diagnosis not present

## 2018-09-14 DIAGNOSIS — E1122 Type 2 diabetes mellitus with diabetic chronic kidney disease: Secondary | ICD-10-CM | POA: Diagnosis present

## 2018-09-14 DIAGNOSIS — Z87891 Personal history of nicotine dependence: Secondary | ICD-10-CM

## 2018-09-14 DIAGNOSIS — Z79891 Long term (current) use of opiate analgesic: Secondary | ICD-10-CM

## 2018-09-14 DIAGNOSIS — Z87440 Personal history of urinary (tract) infections: Secondary | ICD-10-CM

## 2018-09-14 DIAGNOSIS — Z79890 Hormone replacement therapy (postmenopausal): Secondary | ICD-10-CM

## 2018-09-14 DIAGNOSIS — Z85820 Personal history of malignant melanoma of skin: Secondary | ICD-10-CM | POA: Diagnosis not present

## 2018-09-14 DIAGNOSIS — R079 Chest pain, unspecified: Secondary | ICD-10-CM | POA: Diagnosis not present

## 2018-09-14 DIAGNOSIS — M109 Gout, unspecified: Secondary | ICD-10-CM | POA: Diagnosis present

## 2018-09-14 NOTE — ED Triage Notes (Signed)
Pt c/o 9/10 bilateral cp for the past few hours, got 2 Nitro sl pta with some relief. Hx of MI and 5 stents

## 2018-09-15 ENCOUNTER — Ambulatory Visit: Payer: Medicare HMO | Admitting: Internal Medicine

## 2018-09-15 ENCOUNTER — Telehealth: Payer: Self-pay

## 2018-09-15 DIAGNOSIS — I249 Acute ischemic heart disease, unspecified: Secondary | ICD-10-CM | POA: Diagnosis not present

## 2018-09-15 DIAGNOSIS — Z9842 Cataract extraction status, left eye: Secondary | ICD-10-CM | POA: Diagnosis not present

## 2018-09-15 DIAGNOSIS — J42 Unspecified chronic bronchitis: Secondary | ICD-10-CM | POA: Diagnosis present

## 2018-09-15 DIAGNOSIS — G4733 Obstructive sleep apnea (adult) (pediatric): Secondary | ICD-10-CM | POA: Diagnosis present

## 2018-09-15 DIAGNOSIS — Z85820 Personal history of malignant melanoma of skin: Secondary | ICD-10-CM | POA: Diagnosis not present

## 2018-09-15 DIAGNOSIS — I252 Old myocardial infarction: Secondary | ICD-10-CM | POA: Diagnosis not present

## 2018-09-15 DIAGNOSIS — E1140 Type 2 diabetes mellitus with diabetic neuropathy, unspecified: Secondary | ICD-10-CM

## 2018-09-15 DIAGNOSIS — Z955 Presence of coronary angioplasty implant and graft: Secondary | ICD-10-CM | POA: Diagnosis not present

## 2018-09-15 DIAGNOSIS — N183 Chronic kidney disease, stage 3 (moderate): Secondary | ICD-10-CM

## 2018-09-15 DIAGNOSIS — I5043 Acute on chronic combined systolic (congestive) and diastolic (congestive) heart failure: Secondary | ICD-10-CM | POA: Diagnosis present

## 2018-09-15 DIAGNOSIS — K219 Gastro-esophageal reflux disease without esophagitis: Secondary | ICD-10-CM | POA: Diagnosis present

## 2018-09-15 DIAGNOSIS — I361 Nonrheumatic tricuspid (valve) insufficiency: Secondary | ICD-10-CM | POA: Diagnosis not present

## 2018-09-15 DIAGNOSIS — I370 Nonrheumatic pulmonary valve stenosis: Secondary | ICD-10-CM | POA: Diagnosis not present

## 2018-09-15 DIAGNOSIS — Z885 Allergy status to narcotic agent status: Secondary | ICD-10-CM | POA: Diagnosis not present

## 2018-09-15 DIAGNOSIS — M1990 Unspecified osteoarthritis, unspecified site: Secondary | ICD-10-CM | POA: Diagnosis present

## 2018-09-15 DIAGNOSIS — M545 Low back pain: Secondary | ICD-10-CM | POA: Diagnosis present

## 2018-09-15 DIAGNOSIS — R072 Precordial pain: Secondary | ICD-10-CM | POA: Diagnosis not present

## 2018-09-15 DIAGNOSIS — Z6836 Body mass index (BMI) 36.0-36.9, adult: Secondary | ICD-10-CM | POA: Diagnosis not present

## 2018-09-15 DIAGNOSIS — G8929 Other chronic pain: Secondary | ICD-10-CM | POA: Diagnosis present

## 2018-09-15 DIAGNOSIS — M109 Gout, unspecified: Secondary | ICD-10-CM | POA: Diagnosis present

## 2018-09-15 DIAGNOSIS — I5031 Acute diastolic (congestive) heart failure: Secondary | ICD-10-CM | POA: Diagnosis not present

## 2018-09-15 DIAGNOSIS — E785 Hyperlipidemia, unspecified: Secondary | ICD-10-CM | POA: Diagnosis present

## 2018-09-15 DIAGNOSIS — I2510 Atherosclerotic heart disease of native coronary artery without angina pectoris: Secondary | ICD-10-CM | POA: Diagnosis present

## 2018-09-15 DIAGNOSIS — I259 Chronic ischemic heart disease, unspecified: Secondary | ICD-10-CM

## 2018-09-15 DIAGNOSIS — I130 Hypertensive heart and chronic kidney disease with heart failure and stage 1 through stage 4 chronic kidney disease, or unspecified chronic kidney disease: Secondary | ICD-10-CM | POA: Diagnosis present

## 2018-09-15 DIAGNOSIS — I200 Unstable angina: Secondary | ICD-10-CM | POA: Diagnosis present

## 2018-09-15 DIAGNOSIS — M542 Cervicalgia: Secondary | ICD-10-CM | POA: Diagnosis present

## 2018-09-15 DIAGNOSIS — R079 Chest pain, unspecified: Secondary | ICD-10-CM | POA: Diagnosis present

## 2018-09-15 DIAGNOSIS — M1000 Idiopathic gout, unspecified site: Secondary | ICD-10-CM

## 2018-09-15 DIAGNOSIS — I714 Abdominal aortic aneurysm, without rupture: Secondary | ICD-10-CM | POA: Diagnosis present

## 2018-09-15 DIAGNOSIS — Z888 Allergy status to other drugs, medicaments and biological substances status: Secondary | ICD-10-CM | POA: Diagnosis not present

## 2018-09-15 DIAGNOSIS — E1142 Type 2 diabetes mellitus with diabetic polyneuropathy: Secondary | ICD-10-CM | POA: Diagnosis present

## 2018-09-15 DIAGNOSIS — Z87442 Personal history of urinary calculi: Secondary | ICD-10-CM | POA: Diagnosis not present

## 2018-09-15 DIAGNOSIS — R0789 Other chest pain: Secondary | ICD-10-CM | POA: Diagnosis not present

## 2018-09-15 DIAGNOSIS — Z981 Arthrodesis status: Secondary | ICD-10-CM | POA: Diagnosis not present

## 2018-09-15 LAB — GLUCOSE, CAPILLARY
Glucose-Capillary: 143 mg/dL — ABNORMAL HIGH (ref 70–99)
Glucose-Capillary: 252 mg/dL — ABNORMAL HIGH (ref 70–99)

## 2018-09-15 LAB — CBC
HCT: 38.6 % — ABNORMAL LOW (ref 39.0–52.0)
HEMOGLOBIN: 12.7 g/dL — AB (ref 13.0–17.0)
MCH: 31.2 pg (ref 26.0–34.0)
MCHC: 32.9 g/dL (ref 30.0–36.0)
MCV: 94.8 fL (ref 80.0–100.0)
NRBC: 0 % (ref 0.0–0.2)
PLATELETS: 153 10*3/uL (ref 150–400)
RBC: 4.07 MIL/uL — ABNORMAL LOW (ref 4.22–5.81)
RDW: 14.1 % (ref 11.5–15.5)
WBC: 6.3 10*3/uL (ref 4.0–10.5)

## 2018-09-15 LAB — URINALYSIS, ROUTINE W REFLEX MICROSCOPIC
Bilirubin Urine: NEGATIVE
GLUCOSE, UA: NEGATIVE mg/dL
Hgb urine dipstick: NEGATIVE
KETONES UR: NEGATIVE mg/dL
Leukocytes, UA: NEGATIVE
Nitrite: NEGATIVE
PROTEIN: NEGATIVE mg/dL
Specific Gravity, Urine: 1.015 (ref 1.005–1.030)
pH: 6.5 (ref 5.0–8.0)

## 2018-09-15 LAB — TROPONIN I
Troponin I: <0.03 ng/mL (ref <0.03)
Troponin I: <0.03 ng/mL (ref <0.03)

## 2018-09-15 LAB — BASIC METABOLIC PANEL
ANION GAP: 9 (ref 5–15)
BUN: 34 mg/dL — ABNORMAL HIGH (ref 8–23)
CALCIUM: 9.6 mg/dL (ref 8.9–10.3)
CO2: 25 mmol/L (ref 22–32)
Chloride: 104 mmol/L (ref 98–111)
Creatinine, Ser: 1.73 mg/dL — ABNORMAL HIGH (ref 0.61–1.24)
GFR, EST AFRICAN AMERICAN: 43 mL/min — AB (ref >60)
GFR, EST NON AFRICAN AMERICAN: 37 mL/min — AB (ref >60)
Glucose, Bld: 221 mg/dL — ABNORMAL HIGH (ref 70–99)
POTASSIUM: 3.8 mmol/L (ref 3.5–5.1)
SODIUM: 138 mmol/L (ref 135–145)

## 2018-09-15 LAB — BRAIN NATRIURETIC PEPTIDE: B Natriuretic Peptide: 17.4 pg/mL (ref 0.0–100.0)

## 2018-09-15 MED ORDER — ALBUTEROL SULFATE HFA 108 (90 BASE) MCG/ACT IN AERS: 2.0000 | INHALATION_SPRAY | Freq: Four times a day (QID) | RESPIRATORY_TRACT | Status: DC | PRN

## 2018-09-15 MED ORDER — NITROGLYCERIN 0.4 MG SL SUBL
0.4000 mg | SUBLINGUAL_TABLET | SUBLINGUAL | Status: DC | PRN
  Administered 2018-09-15: 0.4 mg via SUBLINGUAL
  Filled 2018-09-15: qty 1

## 2018-09-15 MED ORDER — ACETAMINOPHEN 650 MG RE SUPP: 650.0000 mg | Freq: Four times a day (QID) | RECTAL | Status: DC | PRN

## 2018-09-15 MED ORDER — ASPIRIN EC 81 MG PO TBEC
81.0000 mg | DELAYED_RELEASE_TABLET | Freq: Every day | ORAL | Status: DC
  Administered 2018-09-16: 81 mg via ORAL
  Filled 2018-09-15: qty 1

## 2018-09-15 MED ORDER — HYDROCODONE-ACETAMINOPHEN 5-325 MG PO TABS: 1.0000 | ORAL_TABLET | ORAL | Status: DC | PRN

## 2018-09-15 MED ORDER — ACETAMINOPHEN 325 MG PO TABS: 650.0000 mg | ORAL_TABLET | Freq: Four times a day (QID) | ORAL | Status: DC | PRN

## 2018-09-15 MED ORDER — NITROGLYCERIN 0.4 MG SL SUBL: 0.4000 mg | SUBLINGUAL_TABLET | SUBLINGUAL | Status: DC | PRN

## 2018-09-15 MED ORDER — INSULIN NPH (HUMAN) (ISOPHANE) 100 UNIT/ML ~~LOC~~ SUSP
50.0000 [IU] | Freq: Every day | SUBCUTANEOUS | Status: DC
  Filled 2018-09-15: qty 10

## 2018-09-15 MED ORDER — FLUTICASONE PROPIONATE 50 MCG/ACT NA SUSP: 2.0000 | Freq: Every day | NASAL | Status: DC

## 2018-09-15 MED ORDER — TIZANIDINE HCL 4 MG PO CAPS: 4.0000 mg | ORAL_CAPSULE | Freq: Three times a day (TID) | ORAL | Status: DC

## 2018-09-15 MED ORDER — ISOSORBIDE MONONITRATE ER 30 MG PO TB24
30.0000 mg | ORAL_TABLET | Freq: Every day | ORAL | Status: DC
  Administered 2018-09-16: 30 mg via ORAL
  Filled 2018-09-15: qty 1

## 2018-09-15 MED ORDER — PANTOPRAZOLE SODIUM 20 MG PO TBEC
20.0000 mg | DELAYED_RELEASE_TABLET | Freq: Every day | ORAL | Status: DC
  Administered 2018-09-16: 20 mg via ORAL
  Filled 2018-09-15: qty 1

## 2018-09-15 MED ORDER — FENOFIBRATE 160 MG PO TABS
160.0000 mg | ORAL_TABLET | Freq: Every day | ORAL | Status: DC
  Administered 2018-09-15: 160 mg via ORAL
  Filled 2018-09-15: qty 1

## 2018-09-15 MED ORDER — ATORVASTATIN CALCIUM 40 MG PO TABS
40.0000 mg | ORAL_TABLET | Freq: Every day | ORAL | Status: DC
  Administered 2018-09-16: 40 mg via ORAL
  Filled 2018-09-15: qty 1

## 2018-09-15 MED ORDER — B COMPLEX-B12 PO TABS: ORAL_TABLET | Freq: Every day | ORAL | Status: DC

## 2018-09-15 MED ORDER — ONDANSETRON HCL 4 MG PO TABS: 4.0000 mg | ORAL_TABLET | Freq: Four times a day (QID) | ORAL | Status: DC | PRN

## 2018-09-15 MED ORDER — FUROSEMIDE 40 MG PO TABS
60.0000 mg | ORAL_TABLET | Freq: Every day | ORAL | Status: DC
  Administered 2018-09-16: 60 mg via ORAL
  Filled 2018-09-15: qty 1

## 2018-09-15 MED ORDER — ALLOPURINOL 300 MG PO TABS
300.0000 mg | ORAL_TABLET | Freq: Every day | ORAL | Status: DC
  Administered 2018-09-16: 300 mg via ORAL
  Filled 2018-09-15: qty 1

## 2018-09-15 MED ORDER — SODIUM CHLORIDE 0.9% FLUSH
3.0000 mL | Freq: Two times a day (BID) | INTRAVENOUS | Status: DC
  Administered 2018-09-15: 3 mL via INTRAVENOUS

## 2018-09-15 MED ORDER — ACETAMINOPHEN 500 MG PO TABS: 1000.0000 mg | ORAL_TABLET | Freq: Two times a day (BID) | ORAL | Status: DC

## 2018-09-15 MED ORDER — TAMSULOSIN HCL 0.4 MG PO CAPS
0.4000 mg | ORAL_CAPSULE | Freq: Every day | ORAL | Status: DC
  Administered 2018-09-16: 0.4 mg via ORAL
  Filled 2018-09-15: qty 1

## 2018-09-15 MED ORDER — ZOLPIDEM TARTRATE 5 MG PO TABS: 5.0000 mg | ORAL_TABLET | Freq: Every evening | ORAL | Status: DC | PRN

## 2018-09-15 MED ORDER — INSULIN ASPART 100 UNIT/ML ~~LOC~~ SOLN
0.0000 [IU] | Freq: Three times a day (TID) | SUBCUTANEOUS | Status: DC
  Administered 2018-09-16: 3 [IU] via SUBCUTANEOUS
  Administered 2018-09-16: 2 [IU] via SUBCUTANEOUS

## 2018-09-15 MED ORDER — SODIUM CHLORIDE 0.9 % IV SOLN: 250.0000 mL | INTRAVENOUS | Status: DC | PRN

## 2018-09-15 MED ORDER — DIPHENHYDRAMINE HCL 25 MG PO TABS: 25.0000 mg | ORAL_TABLET | Freq: Four times a day (QID) | ORAL | Status: DC

## 2018-09-15 MED ORDER — INSULIN ASPART 100 UNIT/ML ~~LOC~~ SOLN
0.0000 [IU] | Freq: Every day | SUBCUTANEOUS | Status: DC
  Administered 2018-09-15: 3 [IU] via SUBCUTANEOUS

## 2018-09-15 MED ORDER — ONDANSETRON HCL 4 MG/2ML IJ SOLN: 4.0000 mg | Freq: Four times a day (QID) | INTRAMUSCULAR | Status: DC | PRN

## 2018-09-15 MED ORDER — SODIUM CHLORIDE 0.9% FLUSH: 3.0000 mL | INTRAVENOUS | Status: DC | PRN

## 2018-09-15 MED ORDER — VITAMIN D 1000 UNITS PO CAPS: 1000.0000 [IU] | ORAL_CAPSULE | Freq: Every day | ORAL | Status: DC

## 2018-09-15 MED ORDER — INSULIN NPH (HUMAN) (ISOPHANE) 100 UNIT/ML ~~LOC~~ SUSP
45.0000 [IU] | Freq: Every day | SUBCUTANEOUS | Status: DC
  Filled 2018-09-15: qty 10

## 2018-09-15 MED ORDER — ENOXAPARIN SODIUM 40 MG/0.4ML ~~LOC~~ SOLN
40.0000 mg | SUBCUTANEOUS | Status: DC
  Administered 2018-09-16: 40 mg via SUBCUTANEOUS
  Filled 2018-09-15: qty 0.4

## 2018-09-15 MED ORDER — METOPROLOL TARTRATE 50 MG PO TABS
50.0000 mg | ORAL_TABLET | Freq: Two times a day (BID) | ORAL | Status: DC
  Administered 2018-09-15 – 2018-09-16 (×2): 50 mg via ORAL
  Filled 2018-09-15 (×2): qty 1

## 2018-09-15 MED ORDER — ALBUTEROL SULFATE (2.5 MG/3ML) 0.083% IN NEBU: 2.5000 mg | INHALATION_SOLUTION | RESPIRATORY_TRACT | Status: DC | PRN

## 2018-09-15 MED ORDER — INSULIN NPH (HUMAN) (ISOPHANE) 100 UNIT/ML ~~LOC~~ SUSP: 40.0000 [IU] | Freq: Two times a day (BID) | SUBCUTANEOUS | Status: DC

## 2018-09-15 MED ORDER — ONDANSETRON HCL 4 MG/2ML IJ SOLN
4.0000 mg | Freq: Once | INTRAMUSCULAR | Status: AC
  Administered 2018-09-15: 4 mg via INTRAVENOUS
  Filled 2018-09-15: qty 2

## 2018-09-15 NOTE — ED Provider Notes (Addendum)
[facility] Provider Note: Georgena Spurling, MD, FACEP  CSN: [id number] MRN: [id number] ARRIVAL: 09/14/18 at 2327 ROOM: MH06/MH06   CHIEF COMPLAINT  Chest Pain   HISTORY OF PRESENT ILLNESS  09/15/18 12:20 AM William Hendrix is a 77 y.o. male with a history of coronary artery disease.  He had an episode of chest pain yesterday afternoon that resolved with nitroglycerin.  He had another episode that began about 10:30 PM.  He describes that his pressure across his chest.  He rated the pain as a 9 out of 10.  He took 2 nitroglycerin with partial relief.  He rates his pressure as a 7 out of 10 now but without the painful component he had earlier.  He has had associated shortness of breath and nausea but no diaphoresis.  He takes aspirin daily.  He is on Lasix but has had a 10 pound weight gain in the last week with some edema of his lower legs.  He was recently treated for a urinary tract infection but continues to have some mild dysuria and bilateral flank pain.  He is currently on levofloxacin 250 mg every other day.  He was previously on cephalexin but his urine culture grew out Enterobacter cloacae showing cephalosporin resistance.   Past Medical History:  Diagnosis Date  . AAA (abdominal aortic aneurysm) ([place]) 12/15/2014   a. Mild aneurysmal dilatation of the infrarenal abdominal aorta 3.2 cm - f/u due by 2019.  . Arthritis    "all over" (07/30/2016)  . CAD (coronary artery disease)    a. stent to LAD 2000. b. possible spasm by cath 2001. c. IVUS/PTCA/DES to mLAD 05/2013. d. PTCA/DES of dRCA into ostial rPDA 07/2013. e. PTCA of OM2, DES to Cx, DES to [facility] 07/2016.  Marland Kitchen Chronic bronchitis ([place])   . Chronic diastolic CHF (congestive heart failure) ([place])   . Chronic lower back pain   . Chronic neck pain   . CKD (chronic kidney disease), stage III ([place])   . Diabetic peripheral neuropathy ([place])   . Ejection fraction    55%, 07/2010, mild inferior hypo  . GERD (gastroesophageal  reflux disease)   . Gout   . H/O diverticulitis of colon 01/31/2015  . H/O hiatal hernia   . History of blood transfusion ~ 10/1940   "had pneumonia"  . History of kidney stones   . HTN (hypertension)   . Hyperlipidemia   . Melanoma of forearm, left ([place]) 02/2011   with wide excision   . Myocardial infarction ([place])   . Nephrolithiasis   . Obesity   . OSA on CPAP     [name] since 2000  . Peripheral neuropathy    both feet  . Positive D-dimer    a. significant elevation ,hospital 07/2010, etiology unclear. b. D Dimer chronically > 20.  Marland Kitchen Renal insufficiency   . Skin cancer   . Spinal stenosis    a. s/p surgical repair 2013  . Spinal stenosis of lumbar region   . Type II diabetes mellitus ([place])   . Ventral hernia     Past Surgical History:  Procedure Laterality Date  . ANTERIOR LAT LUMBAR FUSION  10/22/2017   Procedure: Lumbar two-three Lumbar three-four Lumbar four-five Anteriolateral lumbar interbody fusion with percutaneous pedicle screw fixation and infuse;  Surgeon: [name], MD;  Location: [facility];  Service: Neurosurgery;;  . APPLICATION OF ROBOTIC ASSISTANCE FOR SPINAL PROCEDURE  10/22/2017   Procedure: APPLICATION OF ROBOTIC ASSISTANCE FOR SPINAL PROCEDURE;  Surgeon: [name], MD;  Location: [facility];  Service: Neurosurgery;;  . BACK SURGERY    . CARDIAC CATHETERIZATION N/A 07/30/2016   Procedure: Left Heart Cath and Coronary Angiography;  Surgeon: Nelva Bush, MD;  Location: [facility] CV LAB;  Service: Cardiovascular;  Laterality: N/A;  . CARDIAC CATHETERIZATION N/A 07/30/2016   Procedure: Coronary Stent Intervention;  Surgeon: Nelva Bush, MD;  Location: [facility] CV LAB;  Service: Cardiovascular;  Laterality: N/A;  Mid CFX and MID RCA  . CARDIAC CATHETERIZATION N/A 07/30/2016   Procedure: Coronary Balloon Angioplasty;  Surgeon: Nelva Bush, MD;  Location: [facility] CV LAB;  Service: Cardiovascular;  Laterality: N/A;  OM 1  . CARPAL TUNNEL RELEASE  Left   . CATARACT EXTRACTION W/ INTRAOCULAR LENS  IMPLANT, BILATERAL Bilateral ~ 2014  . CERVICAL DISC SURGERY  1990s  . CORONARY ANGIOPLASTY WITH STENT PLACEMENT  05/2013  . CORONARY ANGIOPLASTY WITH STENT PLACEMENT  07/26/2013   DES to RCA extending to PDA    . CORONARY ANGIOPLASTY WITH STENT PLACEMENT  07/30/2016  . CORONARY ANGIOPLASTY WITH STENT PLACEMENT  2000   CAD  . DENTAL SURGERY  04/2016   "got infected; had to dig it out"  . EYE SURGERY    . KNEE ARTHROSCOPY Right 1990's   rt  . LEFT AND RIGHT HEART CATHETERIZATION WITH CORONARY ANGIOGRAM N/A 07/26/2013   Procedure: LEFT AND RIGHT HEART CATHETERIZATION WITH CORONARY ANGIOGRAM;  Surgeon: [name], MD;  Location: [facility] CATH LAB;  Service: Cardiovascular;  Laterality: N/A;  . LEFT HEART CATH AND CORONARY ANGIOGRAPHY N/A 02/26/2017   Procedure: Left Heart Cath and Coronary Angiography;  Surgeon: Sherren Mocha, MD;  Location: [facility] CV LAB;  Service: Cardiovascular;  Laterality: N/A;  . LEFT HEART CATHETERIZATION WITH CORONARY ANGIOGRAM N/A 05/19/2013   Procedure: LEFT HEART CATHETERIZATION WITH CORONARY ANGIOGRAM;  Surgeon: [name], MD;  Location: [facility] CATH LAB;  Service: Cardiovascular;  Laterality: N/A;  . LUMBAR LAMINECTOMY/DECOMPRESSION MICRODISCECTOMY  08/30/2012   Procedure: LUMBAR LAMINECTOMY/DECOMPRESSION MICRODISCECTOMY 2 LEVELS;  Surgeon: [name], MD;  Location: [facility];  Service: Neurosurgery;  Laterality: Bilateral;  Bilateral Lumbar three-four Lumbar four-five Laminotomies  . LUMBAR PERCUTANEOUS PEDICLE SCREW 1 LEVEL Bilateral 10/22/2017   Procedure: LUMBAR PERCUTANEOUS PEDICLE SCREW 1 LEVEL;  Surgeon: [name], MD;  Location: [facility];  Service: Neurosurgery;  Laterality: Bilateral;  . LUMBAR SPINE SURGERY  04/07/2016   Dr. Ellene Route; "?ruptured disc"  . MELANOMA EXCISION Left 02/2011   forearm  . NASAL SINUS SURGERY  1970s   "cut windows in sinus pockets"  . PERCUTANEOUS CORONARY STENT  INTERVENTION (PCI-S)  05/19/2013   Procedure: PERCUTANEOUS CORONARY STENT INTERVENTION (PCI-S);  Surgeon: [name], MD;  Location: [facility] CATH LAB;  Service: Cardiovascular;;  . ULNAR TUNNEL RELEASE Left 04/07/2016   Dr. Ellene Route    Family History  Problem Relation Age of Onset  . Cancer Mother        intestinal   . Heart disease Mother   . Cancer Father        prostate  . Migraines Daughter   . Leukemia Sister   . Heart disease Sister   . Prostate cancer Unknown   . Kidney cancer Unknown   . Cancer Unknown        Bladder cancer  . Coronary artery disease Unknown   . Hypertension Sister   . Hypertension Brother   . Heart attack Neg Hx   . Stroke Neg Hx  Social History   Tobacco Use  . Smoking status: Former Smoker    Packs/day: 3.00    Years: 30.00    Pack years: 90.00    Types: Cigarettes    Last attempt to quit: 09/17/1996    Years since quitting: 22.0  . Smokeless tobacco: Never Used  Substance Use Topics  . Alcohol use: No  . Drug use: No    Prior to Admission medications   Medication Sig Start Date End Date Taking? Authorizing Provider  acetaminophen (TYLENOL) 500 MG tablet Take 1,000 mg by mouth 2 (two) times daily.    [provider]  albuterol (PROVENTIL HFA;VENTOLIN HFA) 108 (90 Base) MCG/ACT inhaler Inhale 2 puffs into the lungs every 6 (six) hours as needed for wheezing or shortness of breath. Patient not taking: Reported on 08/29/2018 10/30/16   Saguier, Percell Miller, PA-C  allopurinol (ZYLOPRIM) 300 MG tablet Take 1 tablet (300 mg total) by mouth daily. 08/01/18   [name], MD  aspirin EC 81 MG tablet Take 81 mg by mouth daily. 02/23/11   [name], MD  atorvastatin (LIPITOR) 40 MG tablet Take 1 tablet (40 mg total) by mouth daily. 08/01/18   [name], MD  B Complex Vitamins (B COMPLEX-B12 PO) Take 1 tablet by mouth at bedtime. 07/23/16   [provider]  BD INSULIN SYRINGE ULTRAFINE 31G X 5/16" 1 ML MISC USE  TO INJECT   INSULIN  FIVE  TIMES  DAILY AS INSTRUCTED 03/17/17   [name], MD  chlorpheniramine (CHLOR-TRIMETON) 4 MG tablet Take 4 mg by mouth daily.    [provider]  Cholecalciferol (VITAMIN D) 1000 UNITS capsule Take 1,000 Units by mouth at bedtime.     [provider]  fenofibrate 160 MG tablet Take 1 tablet (160 mg total) by mouth at bedtime. 08/01/18   [name], MD  fluocinonide cream (LIDEX) 2.44 % Apply 1 application topically daily as needed (for rash).  08/11/12   [provider]  fluticasone (FLONASE) 50 MCG/ACT nasal spray Place 2 sprays into both nostrils daily. 08/11/17   Saguier, Percell Miller, PA-C  furosemide (LASIX) 40 MG tablet TAKE 1 AND 1/2 TABLETS EVERY DAY Patient taking differently: Take 60 mg by mouth every day 07/12/17   Lelon Perla, MD  glucose blood (TRUE METRIX BLOOD GLUCOSE TEST) test strip USE TO TEST BLOOD SUGAR THREE TIMES DAILY AS DIRECTED 08/01/18   [name], MD  HYDROcodone-acetaminophen (NORCO/VICODIN) 5-325 MG tablet Take 1-2 tablets by mouth every 4 (four) hours as needed for severe pain ((score 7 to 10)). 10/25/17   [name], MD  insulin NPH Human (NOVOLIN N) 100 UNIT/ML injection Inject 45 units in am and 40 in the evening Patient taking differently: Inject 45 units in am and 50 in the evening 05/09/18   [name], MD  insulin regular (NOVOLIN R RELION) 100 units/mL injection Inject 0.25-0.3 mLs (25-30 Units total) into the skin 3 (three) times daily before meals. Patient taking differently: Inject 35 Units into the skin 3 (three) times daily before meals.  09/17/17   [name], MD  isosorbide mononitrate (IMDUR) 30 MG 24 hr tablet Take 1 tablet (30 mg total) by mouth daily. 05/13/18   Lelon Perla, MD  levofloxacin (LEVAQUIN) 250 MG tablet Take 1 tablet (250 mg total) by mouth every other day. 09/01/18   [name], MD  metoprolol tartrate (LOPRESSOR) 50 MG tablet Take 1 tablet (50 mg total) by  mouth  2 (two) times daily. 08/01/18   [name], MD  mupirocin ointment Drue Stager) 2 % Apply to area thin film twice daily 09/29/17   Saguier, Percell Miller, PA-C  nitroGLYCERIN (NITROSTAT) 0.4 MG SL tablet Place 1 tablet (0.4 mg total) under the tongue every 5 (five) minutes as needed for chest pain. 02/16/18   Lelon Perla, MD  pantoprazole (PROTONIX) 20 MG tablet Take 1 tablet (20 mg total) by mouth daily. 08/01/18   [name], MD  Probiotic Product (PROBIOTIC PO) Take 1 capsule by mouth daily.    [provider]  tamsulosin (FLOMAX) 0.4 MG CAPS capsule Take 1 capsule (0.4 mg total) by mouth daily. 08/01/18   [name], MD  tiZANidine (ZANAFLEX) 4 MG capsule Take 1 capsule (4 mg total) by mouth 4 (four) times daily as needed for muscle spasms. Take 1 tablet every 6 hours as needed; not to exceed 3 doses in 24 hours. 02/09/17   [name], MD    Allergies Levemir [insulin detemir]; Codeine; Lipitor [atorvastatin]; Novolog mix [insulin aspart prot & aspart]; and Other   REVIEW OF SYSTEMS  Negative except as noted here or in the History of Present Illness.   PHYSICAL EXAMINATION  Initial Vital Signs Blood pressure 134/64, pulse 78, temperature 98.4 F (36.9 C), temperature source Oral, resp. rate 19, height 5\' 10"  (1.778 m), weight 118.4 kg, SpO2 95 %.  Examination General: Well-developed, well-nourished male in no acute distress; appearance consistent with age of record HENT: normocephalic; atraumatic Eyes: pupils equal, round and reactive to light; extraocular muscles intact; bilateral pseudophakia Neck: supple Heart: regular rate and rhythm Lungs: clear to auscultation bilaterally Abdomen: soft; nondistended; no diffuse tenderness; bowel sounds present Back: Mild bilateral CVA tenderness Extremities: No deformity; full range of motion; pulses normal; +1 edema of lower legs Neurologic: Awake, alert and oriented; motor function intact in all extremities and  symmetric; no facial droop Skin: Warm and dry Psychiatric: Normal mood and affect; circumstantial speech   RESULTS  Summary of this visit's results, reviewed by myself:   EKG Interpretation  Date/Time:  Wednesday September 14 2018 23:38:05 EST Ventricular Rate:  72 PR Interval:  182 QRS Duration: 92 QT Interval:  404 QTC Calculation: 442 R Axis:   -59 Text Interpretation:  Normal sinus rhythm Left anterior fascicular block Abnormal ECG No STEMI. Similar to prior tracing.  Confirmed by Nanda Quinton [phone]) on 09/14/2018 11:41:41 PM      Laboratory Studies: Results for orders placed or performed during the hospital encounter of 09/14/18 (from the past 24 hour(s))  Basic metabolic panel     Status: Abnormal   Collection Time: 09/15/18 12:22 AM  Result Value Ref Range   Sodium 138 135 - 145 mmol/L   Potassium 3.8 3.5 - 5.1 mmol/L   Chloride 104 98 - 111 mmol/L   CO2 25 22 - 32 mmol/L   Glucose, Bld 221 (H) 70 - 99 mg/dL   BUN 34 (H) 8 - 23 mg/dL   Creatinine, Ser 1.73 (H) 0.61 - 1.24 mg/dL   Calcium 9.6 8.9 - 10.3 mg/dL   GFR calc non Af Amer 37 (L) >60 mL/min   GFR calc Af Amer 43 (L) >60 mL/min   Anion gap 9 5 - 15  CBC     Status: Abnormal   Collection Time: 09/15/18 12:22 AM  Result Value Ref Range   WBC 6.3 4.0 - 10.5 K/uL   RBC 4.07 (L) 4.22 - 5.81 MIL/uL  Hemoglobin 12.7 (L) 13.0 - 17.0 g/dL   HCT 38.6 (L) 39.0 - 52.0 %   MCV 94.8 80.0 - 100.0 fL   MCH 31.2 26.0 - 34.0 pg   MCHC 32.9 30.0 - 36.0 g/dL   RDW 14.1 11.5 - 15.5 %   Platelets 153 150 - 400 K/uL   nRBC 0.0 0.0 - 0.2 %  Troponin I - ONCE - STAT     Status: None   Collection Time: 09/15/18 12:22 AM  Result Value Ref Range   Troponin I <0.03 <0.03 ng/mL  Brain natriuretic peptide     Status: None   Collection Time: 09/15/18 12:22 AM  Result Value Ref Range   B Natriuretic Peptide 17.4 0.0 - 100.0 pg/mL  Urinalysis, Routine w reflex microscopic     Status: None   Collection Time: 09/15/18  1:35 AM    Result Value Ref Range   Color, Urine YELLOW YELLOW   APPearance CLEAR CLEAR   Specific Gravity, Urine 1.015 1.005 - 1.030   pH 6.5 5.0 - 8.0   Glucose, UA NEGATIVE NEGATIVE mg/dL   Hgb urine dipstick NEGATIVE NEGATIVE   Bilirubin Urine NEGATIVE NEGATIVE   Ketones, ur NEGATIVE NEGATIVE mg/dL   Protein, ur NEGATIVE NEGATIVE mg/dL   Nitrite NEGATIVE NEGATIVE   Leukocytes, UA NEGATIVE NEGATIVE  Troponin I - ONCE - STAT     Status: None   Collection Time: 09/15/18  2:40 AM  Result Value Ref Range   Troponin I <0.03 <0.03 ng/mL   Imaging Studies: Dg Chest 2 View  Result Date: 09/15/2018 CLINICAL DATA:  Chest pain EXAM: CHEST - 2 VIEW COMPARISON:  07/25/2018 FINDINGS: Heart is mildly enlarged. Lungs clear. No effusions or edema. No acute bony abnormality. IMPRESSION: Mild cardiomegaly.  No active disease. Electronically Signed   By: Rolm Baptise M.D.   On: 09/15/2018 00:05    ED COURSE and MDM  Nursing notes and initial vitals signs, including pulse oximetry, reviewed.  Vitals:   09/15/18 0230 09/15/18 0300 09/15/18 0330 09/15/18 0400  BP: 136/62 (!) 147/70 (!) 110/94 134/62  Pulse: 74 75 81 72  Resp: (!) 21 15 17 18   Temp:      TempSrc:      SpO2: 95% 97% 93% 93%  Weight:      Height:       1:35 AM Patient was given 1 sublingual nitroglycerin.  He states it made him "feel terrible" and he has refused further nitroglycerin.  He is still having chest pressure.  His first troponin was negative.  1:56 AM Patient pain-free at the present time.  3:01 AM Patient remains pain-free.  Dr. Shirlee Latch of cardiology will see patient in consult but requests admission to the hospitalist service.   3:08 AM Dr. Myna Hidalgo accepts for transfer to [facility] hospitalist service.  PROCEDURES   CRITICAL CARE Performed by: Karen Chafe Chestina Komatsu Total critical care time: 30 minutes Critical care time was exclusive of separately billable procedures and treating other patients. Critical care was necessary  to treat or prevent imminent or life-threatening deterioration. Critical care was time spent personally by me on the following activities: development of treatment plan with patient and/or surrogate as well as nursing, discussions with consultants, evaluation of patient's response to treatment, examination of patient, obtaining history from patient or surrogate, ordering and performing treatments and interventions, ordering and review of laboratory studies, ordering and review of radiographic studies, pulse oximetry and re-evaluation of patient's condition.   ED DIAGNOSES  ICD-10-CM   1. Unstable angina ([place]) I20.0        Mouhamad Teed, MD 09/15/18 0316    Shanon Rosser, MD 09/15/18 [phone]

## 2018-09-15 NOTE — ED Notes (Signed)
ED Provider at bedside. 

## 2018-09-15 NOTE — ED Notes (Signed)
Daughter went home  Pt resting quietly at this time  No acute distress noted

## 2018-09-15 NOTE — Consult Note (Signed)
Cardiology Consultation:   Patient ID: William Hendrix; [id number]; 1941-04-28   Admit date: 09/14/2018 Date of Consult: 09/15/2018  Primary Care Provider: [name], MD Primary Cardiologist: Kirk Ruths, MD  Primary Electrophysiologist:  None   Patient Profile:   William Hendrix is a 77 y.o. male with a PMH of CAD s/p multiple PCI/DES (last [place] 2018 with patent LAD, RCA, and LCx stents), chronic combined CHF (EF 45-50%, G1DD on last echo 2017), HTN, HLD, DM type 2, OSA on CPAP, and AAA who is being seen today for the evaluation of chest pain at the request of Dr. Laren Everts.  History of Present Illness:   William Hendrix has been feeling poorly over the past month with multiple rounds of antibiotics to manage a urinary tract infection. He reports ~10lb weight gain during that time frame without any dietary or lifestyle changes. He reports some SOB and LE swelling during that time as well. On the evening of 09/14/18 he reported having some chest pressure with associated SOB, lightheadedness, and radiation of pain to his left arm. He took 2 SL nitro at that time and symptoms resolved within 20-30 minutes. He ate dinner without any issues, however afterwards around 8:30pm he experienced a 2nd episode at rest with the same associated symptoms which was partially relieved with SL nitro. He reported symptoms felt similar to prior angina when he has needed stents to be placed. Given recurrent symptoms, he presented to [facility] for further evaluation.   He was last seen outpatient by cardiology, Dr. Stanford Breed, 11/2017 at which time he was without anginal complaints. His plavix was discontinued at that visit and he was recommended to continue aspirin and atorvastatin. No other medication changes occurred at that time. His last ischemic evaluation was a LHC 02/2017 to evaluate worsening SOB and episodes of chest pain which revealed 3-vessel CAD with patent stents to RCA, LCx, and LAD without any obstructive  disease. His last echocardiogram 07/2016 showed EF 45-50%, G1DD, and mild LVH.   At the time of this evaluation he is chest pain free with symptoms resolving after receiving a 3rd SL nitro on arrival to [facility]. He has not had any recurrent pain since that time. He reports increased life stressors over the last several months as he and his wife care for their ill daughter. He continues to have problems with low back pain, despite lumbar spine surgery 10/2017, which limits his mobility. He reports compliance with his medications. He monitors his weight daily and reports weighing ~245lbs 3 weeks ago and is now up to 156lbs this admission. He has chronic orthopnea but no PND. He is compliant with his CPAP machine.   Hospital course: hypertensive, otherwise VSS. Labs notable for electrolytes wnl, Cr 1.66>1.73, Hgb 12.7, PLT 153, Trop negative x2, BNP 17.4. UA negative. CXR wth mild cardiomegaly, no acute findings. EKG with sinus rhythm with LAFB, no STE/D, no TWI. He was transferred to [facility] for further evaluation of chest pain.   Past Medical History:  Diagnosis Date  . AAA (abdominal aortic aneurysm) ([place]) 12/15/2014   a. Mild aneurysmal dilatation of the infrarenal abdominal aorta 3.2 cm - f/u due by 2019.  . Arthritis    "all over" (07/30/2016)  . CAD (coronary artery disease)    a. stent to LAD 2000. b. possible spasm by cath 2001. c. IVUS/PTCA/DES to mLAD 05/2013. d. PTCA/DES of dRCA into ostial rPDA 07/2013. e. PTCA of OM2, DES to Cx, DES to [facility] 07/2016.  Marland Kitchen  Chronic bronchitis ([place])   . Chronic diastolic CHF (congestive heart failure) ([place])   . Chronic lower back pain   . Chronic neck pain   . CKD (chronic kidney disease), stage III ([place])   . Diabetic peripheral neuropathy ([place])   . Ejection fraction    55%, 07/2010, mild inferior hypo  . GERD (gastroesophageal reflux disease)   . Gout   . H/O diverticulitis of colon 01/31/2015  . H/O hiatal hernia   . History of blood transfusion ~ 10/1940    "had pneumonia"  . History of kidney stones   . HTN (hypertension)   . Hyperlipidemia   . Melanoma of forearm, left ([place]) 02/2011   with wide excision   . Myocardial infarction ([place])   . Nephrolithiasis   . Obesity   . OSA on CPAP     [name] since 2000  . Peripheral neuropathy    both feet  . Positive D-dimer    a. significant elevation ,hospital 07/2010, etiology unclear. b. D Dimer chronically > 20.  Marland Kitchen Renal insufficiency   . Skin cancer   . Spinal stenosis    a. s/p surgical repair 2013  . Spinal stenosis of lumbar region   . Type II diabetes mellitus ([place])   . Ventral hernia     Past Surgical History:  Procedure Laterality Date  . ANTERIOR LAT LUMBAR FUSION  10/22/2017   Procedure: Lumbar two-three Lumbar three-four Lumbar four-five Anteriolateral lumbar interbody fusion with percutaneous pedicle screw fixation and infuse;  Surgeon: [name], MD;  Location: [place];  Service: Neurosurgery;;  . APPLICATION OF ROBOTIC ASSISTANCE FOR SPINAL PROCEDURE  10/22/2017   Procedure: APPLICATION OF ROBOTIC ASSISTANCE FOR SPINAL PROCEDURE;  Surgeon: [name], MD;  Location: [place];  Service: Neurosurgery;;  . BACK SURGERY    . CARDIAC CATHETERIZATION N/A 07/30/2016   Procedure: Left Heart Cath and Coronary Angiography;  Surgeon: Nelva Bush, MD;  Location: [place] CV LAB;  Service: Cardiovascular;  Laterality: N/A;  . CARDIAC CATHETERIZATION N/A 07/30/2016   Procedure: Coronary Stent Intervention;  Surgeon: Nelva Bush, MD;  Location: [place] CV LAB;  Service: Cardiovascular;  Laterality: N/A;  Mid CFX and MID RCA  . CARDIAC CATHETERIZATION N/A 07/30/2016   Procedure: Coronary Balloon Angioplasty;  Surgeon: Nelva Bush, MD;  Location: [place] CV LAB;  Service: Cardiovascular;  Laterality: N/A;  OM 1  . CARPAL TUNNEL RELEASE Left   . CATARACT EXTRACTION W/ INTRAOCULAR LENS  IMPLANT, BILATERAL Bilateral ~ 2014  . CERVICAL DISC SURGERY  1990s  . CORONARY  ANGIOPLASTY WITH STENT PLACEMENT  05/2013  . CORONARY ANGIOPLASTY WITH STENT PLACEMENT  07/26/2013   DES to RCA extending to PDA    . CORONARY ANGIOPLASTY WITH STENT PLACEMENT  07/30/2016  . CORONARY ANGIOPLASTY WITH STENT PLACEMENT  2000   CAD  . DENTAL SURGERY  04/2016   "got infected; had to dig it out"  . EYE SURGERY    . KNEE ARTHROSCOPY Right 1990's   rt  . LEFT AND RIGHT HEART CATHETERIZATION WITH CORONARY ANGIOGRAM N/A 07/26/2013   Procedure: LEFT AND RIGHT HEART CATHETERIZATION WITH CORONARY ANGIOGRAM;  Surgeon: [name], MD;  Location: [place] CATH LAB;  Service: Cardiovascular;  Laterality: N/A;  . LEFT HEART CATH AND CORONARY ANGIOGRAPHY N/A 02/26/2017   Procedure: Left Heart Cath and Coronary Angiography;  Surgeon: Sherren Mocha, MD;  Location: [place] CV LAB;  Service: Cardiovascular;  Laterality: N/A;  . LEFT HEART CATHETERIZATION WITH CORONARY  ANGIOGRAM N/A 05/19/2013   Procedure: LEFT HEART CATHETERIZATION WITH CORONARY ANGIOGRAM;  Surgeon: [name], MD;  Location: [facility] CATH LAB;  Service: Cardiovascular;  Laterality: N/A;  . LUMBAR LAMINECTOMY/DECOMPRESSION MICRODISCECTOMY  08/30/2012   Procedure: LUMBAR LAMINECTOMY/DECOMPRESSION MICRODISCECTOMY 2 LEVELS;  Surgeon: [name], MD;  Location: [place] NEURO ORS;  Service: Neurosurgery;  Laterality: Bilateral;  Bilateral Lumbar three-four Lumbar four-five Laminotomies  . LUMBAR PERCUTANEOUS PEDICLE SCREW 1 LEVEL Bilateral 10/22/2017   Procedure: LUMBAR PERCUTANEOUS PEDICLE SCREW 1 LEVEL;  Surgeon: [name], MD;  Location: [place];  Service: Neurosurgery;  Laterality: Bilateral;  . LUMBAR SPINE SURGERY  04/07/2016   Dr. Ellene Route; "?ruptured disc"  . MELANOMA EXCISION Left 02/2011   forearm  . NASAL SINUS SURGERY  1970s   "cut windows in sinus pockets"  . PERCUTANEOUS CORONARY STENT INTERVENTION (PCI-S)  05/19/2013   Procedure: PERCUTANEOUS CORONARY STENT INTERVENTION (PCI-S);  Surgeon: [name], MD;  Location:  [facility] CATH LAB;  Service: Cardiovascular;;  . ULNAR TUNNEL RELEASE Left 04/07/2016   Dr. Ellene Route     Home Medications:  Prior to Admission medications   Medication Sig Start Date End Date Taking? Authorizing Provider  acetaminophen (TYLENOL) 500 MG tablet Take 1,000 mg by mouth 2 (two) times daily.    [provider]  albuterol (PROVENTIL HFA;VENTOLIN HFA) 108 (90 Base) MCG/ACT inhaler Inhale 2 puffs into the lungs every 6 (six) hours as needed for wheezing or shortness of breath. Patient not taking: Reported on 08/29/2018 10/30/16   Saguier, Percell Miller, PA-C  allopurinol (ZYLOPRIM) 300 MG tablet Take 1 tablet (300 mg total) by mouth daily. 08/01/18   [name], MD  aspirin EC 81 MG tablet Take 81 mg by mouth daily. 02/23/11   [name], MD  atorvastatin (LIPITOR) 40 MG tablet Take 1 tablet (40 mg total) by mouth daily. 08/01/18   [name], MD  B Complex Vitamins (B COMPLEX-B12 PO) Take 1 tablet by mouth at bedtime. 07/23/16   [provider]  BD INSULIN SYRINGE ULTRAFINE 31G X 5/16" 1 ML MISC USE  TO INJECT  INSULIN  FIVE  TIMES  DAILY AS INSTRUCTED 03/17/17   [name], MD  chlorpheniramine (CHLOR-TRIMETON) 4 MG tablet Take 4 mg by mouth daily.    [provider]  Cholecalciferol (VITAMIN D) 1000 UNITS capsule Take 1,000 Units by mouth at bedtime.     [provider]  fenofibrate 160 MG tablet Take 1 tablet (160 mg total) by mouth at bedtime. 08/01/18   [name], MD  fluocinonide cream (LIDEX) 4.09 % Apply 1 application topically daily as needed (for rash).  08/11/12   [provider]  fluticasone (FLONASE) 50 MCG/ACT nasal spray Place 2 sprays into both nostrils daily. 08/11/17   Saguier, Percell Miller, PA-C  furosemide (LASIX) 40 MG tablet TAKE 1 AND 1/2 TABLETS EVERY DAY Patient taking differently: Take 60 mg by mouth every day 07/12/17   Lelon Perla, MD  glucose blood (TRUE METRIX BLOOD GLUCOSE TEST) test strip USE TO TEST  BLOOD SUGAR THREE TIMES DAILY AS DIRECTED 08/01/18   [name], MD  HYDROcodone-acetaminophen (NORCO/VICODIN) 5-325 MG tablet Take 1-2 tablets by mouth every 4 (four) hours as needed for severe pain ((score 7 to 10)). 10/25/17   [name], MD  insulin NPH Human (NOVOLIN N) 100 UNIT/ML injection Inject 45 units in am and 40 in the evening Patient taking differently: Inject 45 units in am and 50 in the evening 05/09/18  [name], MD  insulin regular (NOVOLIN R RELION) 100 units/mL injection Inject 0.25-0.3 mLs (25-30 Units total) into the skin 3 (three) times daily before meals. Patient taking differently: Inject 35 Units into the skin 3 (three) times daily before meals.  09/17/17   [name], MD  isosorbide mononitrate (IMDUR) 30 MG 24 hr tablet Take 1 tablet (30 mg total) by mouth daily. 05/13/18   Lelon Perla, MD  levofloxacin (LEVAQUIN) 250 MG tablet Take 1 tablet (250 mg total) by mouth every other day. 09/01/18   [name], MD  metoprolol tartrate (LOPRESSOR) 50 MG tablet Take 1 tablet (50 mg total) by mouth 2 (two) times daily. 08/01/18   [name], MD  mupirocin ointment Drue Stager) 2 % Apply to area thin film twice daily 09/29/17   Saguier, Percell Miller, PA-C  nitroGLYCERIN (NITROSTAT) 0.4 MG SL tablet Place 1 tablet (0.4 mg total) under the tongue every 5 (five) minutes as needed for chest pain. 02/16/18   Lelon Perla, MD  pantoprazole (PROTONIX) 20 MG tablet Take 1 tablet (20 mg total) by mouth daily. 08/01/18   [name], MD  Probiotic Product (PROBIOTIC PO) Take 1 capsule by mouth daily.    [provider]  tamsulosin (FLOMAX) 0.4 MG CAPS capsule Take 1 capsule (0.4 mg total) by mouth daily. 08/01/18   [name], MD  tiZANidine (ZANAFLEX) 4 MG capsule Take 1 capsule (4 mg total) by mouth 4 (four) times daily as needed for muscle spasms. Take 1 tablet every 6 hours as needed; not to exceed 3 doses in 24 hours. 02/09/17   [name], MD    Inpatient Medications: Scheduled Meds:  Continuous Infusions:  PRN Meds: nitroGLYCERIN  Allergies:    Allergies  Allergen Reactions  . Levemir [Insulin Detemir] Swelling and Other (See Comments)    Patient had redness and swelling and tenderness at injection site.  . Codeine Other (See Comments)    Makes him "shakey", is OK with hydrocodone GI UPSET & TREMORS  . Lipitor [Atorvastatin] Other (See Comments)    Muscle aches; liver functions. Patient is currently taken  . Novolog Mix [Insulin Aspart Prot & Aspart] Other (See Comments)    Causes skin to swell/itch at injection site  . Other Other (See Comments)    Brilinta caused SOB    Social History:   Social History   Socioeconomic History  . Marital status: Married    Spouse name: Not on file  . Number of children: Not on file  . Years of education: Not on file  . Highest education level: Not on file  Occupational History  . Occupation: [employer]  . Financial resource strain: Not on file  . Food insecurity:    Worry: Not on file    Inability: Not on file  . Transportation needs:    Medical: Not on file    Non-medical: Not on file  Tobacco Use  . Smoking status: Former Smoker    Packs/day: 3.00    Years: 30.00    Pack years: 90.00    Types: Cigarettes    Last attempt to quit: 09/17/1996    Years since quitting: 22.0  . Smokeless tobacco: Never Used  Substance and Sexual Activity  . Alcohol use: No  . Drug use: No  . Sexual activity: Not Currently    Birth control/protection: None  Lifestyle  . Physical activity:    Days per week: Not on file  Minutes per session: Not on file  . Stress: Not on file  Relationships  . Social connections:    Talks on phone: Not on file    Gets together: Not on file    Attends religious service: Not on file    Active member of club or organization: Not on file    Attends meetings of clubs or organizations: Not on file    Relationship  status: Not on file  . Intimate partner violence:    Fear of current or ex partner: Not on file    Emotionally abused: Not on file    Physically abused: Not on file    Forced sexual activity: Not on file  Other Topics Concern  . Not on file  Social History Narrative   Married and lives locally with his wife.  Sharyon Cable.    Family History:    Family History  Problem Relation Age of Onset  . Cancer Mother        intestinal   . Heart disease Mother   . Cancer Father        prostate  . Migraines Daughter   . Leukemia Sister   . Heart disease Sister   . Prostate cancer Unknown   . Kidney cancer Unknown   . Cancer Unknown        Bladder cancer  . Coronary artery disease Unknown   . Hypertension Sister   . Hypertension Brother   . Heart attack Neg Hx   . Stroke Neg Hx      ROS:  Please see the history of present illness.   All other ROS reviewed and negative.     Physical Exam/Data:   Vitals:   09/15/18 0715 09/15/18 0915 09/15/18 1000 09/15/18 1142  BP: (!) 156/70 (!) 157/81 (!) 159/90 (!) 166/66  Pulse: 72 66 64 65  Resp: 18 18 17    Temp:    98.5 F (36.9 C)  TempSrc:    Oral  SpO2: 96% 97% 96% 99%  Weight:      Height:        Intake/Output Summary (Last 24 hours) at 09/15/2018 1353 Last data filed at 09/15/2018 1249 Gross per 24 hour  Intake 240 ml  Output -  Net 240 ml   Filed Weights   09/14/18 2333  Weight: 118.4 kg   Body mass index is 37.45 kg/m.  General:  Well nourished, well developed, obese gentleman laying in bed in no acute distress HEENT: sclera anicteric  Neck: difficult to assess JVD given body habitus Vascular: No carotid bruits; distal pulses 2+ bilaterally Cardiac:  normal S1, S2; RRR; no murmurs, rubs, or gallops Lungs:  clear to auscultation bilaterally, no wheezing, rhonchi or rales  Abd: NABS, soft, obese, nontender, no hepatomegaly Ext: trace LE edema Musculoskeletal:  No deformities, BUE and BLE strength normal and equal Skin:  warm and dry  Neuro:  CNs 2-12 intact, no focal abnormalities noted Psych:  Normal affect   EKG:  The EKG was personally reviewed and demonstrates:  Sinus rhythm with LAFB, no STE/D, no TWI Telemetry:  Telemetry was personally reviewed and demonstrates:  NSR  Relevant CV Studies: Left heart catheterization 02/2017: 1. Three-vessel coronary artery disease with continued patency of the stented segments in the right coronary artery, left circumflex, and LAD 2. Moderately elevated LVEDP  Recommendations: The patient's angiogram as compared to his prior cath films from last year when he underwent 2 vessel PCI of the right coronary artery and left circumflex.  His stents are widely patent and there is no change in coronary anatomy. I do not appreciate any obstructive disease. He should continue with medical therapy and work on lifestyle modification.  Echocardiogram 2017: Study Conclusions  - Left ventricle: The cavity size was normal. Wall thickness was   increased in a pattern of mild LVH. Systolic function was mildly   reduced. The estimated ejection fraction was in the range of 45%   to 50%. Doppler parameters are consistent with abnormal left   ventricular relaxation (grade 1 diastolic dysfunction). - Right ventricle: The cavity size was mildly dilated. Systolic   function was mildly reduced. - Pericardium, extracardiac: A trivial pericardial effusion was   identified. - Impressions: Poor acoustic windows limit study, even with the use   of Definity.  Impressions:  - Poor acoustic windows limit study, even with the use of Definity.   Laboratory Data:  Chemistry Recent Labs  Lab 09/12/18 0815 09/15/18 0022  NA 145 138  K 4.6 3.8  CL 104 104  CO2 31 25  GLUCOSE 144* 221*  BUN 33* 34*  CREATININE 1.66* 1.73*  CALCIUM 10.6* 9.6  GFRNONAA  --  37*  GFRAA  --  43*  ANIONGAP  --  9    Recent Labs  Lab 09/12/18 0815  PROT 7.0  ALBUMIN 4.1  AST 17  ALT 18  ALKPHOS  49  BILITOT 0.5   Hematology Recent Labs  Lab 09/15/18 0022  WBC 6.3  RBC 4.07*  HGB 12.7*  HCT 38.6*  MCV 94.8  MCH 31.2  MCHC 32.9  RDW 14.1  PLT 153   Cardiac Enzymes Recent Labs  Lab 09/15/18 0022 09/15/18 0240  TROPONINI <0.03 <0.03   No results for input(s): TROPIPOC in the last 168 hours.  BNP Recent Labs  Lab 09/15/18 0022  BNP 17.4    DDimer No results for input(s): DDIMER in the last 168 hours.  Radiology/Studies:  Dg Chest 2 View  Result Date: 09/15/2018 CLINICAL DATA:  Chest pain EXAM: CHEST - 2 VIEW COMPARISON:  07/25/2018 FINDINGS: Heart is mildly enlarged. Lungs clear. No effusions or edema. No acute bony abnormality. IMPRESSION: Mild cardiomegaly.  No active disease. Electronically Signed   By: Rolm Baptise M.D.   On: 09/15/2018 00:05    Assessment and Plan:   1. Chest pain in patient with multivessel CAD s/p multiple PCI/DES (last 2017): patient [facility] presented to [facility] with complaints of chest pain and was transferred to [facility] given concerns for unstable angina. His last ischemic evaluation was a LHC 02/2017 where he was found to have 3-vessel CAD with patent stents to RCA, LCx, and LAD, without obstructive disease. Also with complaints of weight gain, SOB, and LE edema - possible there is a CHF component to chest pain - Will check an echocardiogram  - NPO after MN for possible NST tomorrow - Continue metoprolol 50mg  BID and imdur 30mg  daily  2. Chronic combined CHF: last echo in 2017 with EF 45-50% and G1DD. He takes lasix 60mg  daily. Reports ~10lb weight gain over the past 3 weeks without dietary indiscretion or changes in lifestyle. Also with intermittent SOB and LE edema. BNP is wnl and CXR without edema on this admission. He appears euvolemic on exam.  - Will check an echocardiogram to reassess LV function  - Continue metoprolol and lasix  3. HTN: BP appears poorly controlled, however he did not receive his home medications this morning  -  Continue metoprolol tartrate and lasix  4. HLD/hypertiglyceridemia: LDL 69, triglycerides 286 on lipid panel 01/2018 - Continue atorvastatin 40mg  daily - Continue fenofibrate  5. DM type 2: A1C 6.1 04/2018; at goal of <7 - Continue management per primary team   6. CKD stage 3: Cr 1.7 today (baseline appears to be 1.5-1.6) - Continue to monitor closely   7. AAA: noted to be 3.1x3.1cm on last [place] 12/2016. - Recommended for surveillance [place] 11/2018   For questions or updates, please contact [place] Please consult [email or web address] for contact info under Cardiology/STEMI.   Signed, Abigail Butts, PA-C  09/15/2018 1:53 PM [phone]

## 2018-09-15 NOTE — H&P (Addendum)
[facility]                                                                                    Patient Demographics  William Hendrix, is a 77 y.o. male  CSN: [id number]  MRN: [id number]  DOB - 25-Feb-1941  Admit Date - 09/14/2018  Outpatient Primary MD for the patient is William Lukes, MD   With History of -  Past Medical History:  Diagnosis Date  . AAA (abdominal aortic aneurysm) ([place]) 12/15/2014   a. Mild aneurysmal dilatation of the infrarenal abdominal aorta 3.2 cm - f/u due by 2019.  . Arthritis    "all over" (07/30/2016)  . CAD (coronary artery disease)    a. stent to LAD 2000. b. possible spasm by cath 2001. c. IVUS/PTCA/DES to mLAD 05/2013. d. PTCA/DES of dRCA into ostial rPDA 07/2013. e. PTCA of OM2, DES to Cx, DES to [facility] 07/2016.  William Hendrix Chronic bronchitis ([place])   . Chronic diastolic CHF (congestive heart failure) ([place])   . Chronic lower back pain   . Chronic neck pain   . CKD (chronic kidney disease), stage III ([place])   . Diabetic peripheral neuropathy ([place])   . Ejection fraction    55%, 07/2010, mild inferior hypo  . GERD (gastroesophageal reflux disease)   . Gout   . H/O diverticulitis of colon 01/31/2015  . H/O hiatal hernia   . History of blood transfusion ~ 10/1940   "had pneumonia"  . History of kidney stones   . HTN (hypertension)   . Hyperlipidemia   . Melanoma of forearm, left ([place]) 02/2011   with wide excision   . Myocardial infarction ([place])   . Nephrolithiasis   . Obesity   . OSA on CPAP     [name] since 2000  . Peripheral neuropathy    both feet  . Positive D-dimer    a. significant elevation ,hospital 07/2010, etiology unclear. b. D Dimer chronically > 20.  William Hendrix Renal insufficiency   . Skin cancer   . Spinal stenosis    a. s/p surgical repair 2013  . Spinal stenosis of lumbar region   . Type II diabetes mellitus ([place])   . Ventral hernia       Past Surgical History:  Procedure Laterality Date  . ANTERIOR LAT LUMBAR FUSION   10/22/2017   Procedure: Lumbar two-three Lumbar three-four Lumbar four-five Anteriolateral lumbar interbody fusion with percutaneous pedicle screw fixation and infuse;  Surgeon: William Miss, MD;  Location: [place];  Service: Neurosurgery;;  . APPLICATION OF ROBOTIC ASSISTANCE FOR SPINAL PROCEDURE  10/22/2017   Procedure: APPLICATION OF ROBOTIC ASSISTANCE FOR SPINAL PROCEDURE;  Surgeon: William Miss, MD;  Location: [place];  Service: Neurosurgery;;  . BACK SURGERY    . CARDIAC CATHETERIZATION N/A 07/30/2016   Procedure: Left Heart Cath and Coronary Angiography;  Surgeon: William Bush, MD;  Location: [place] CV LAB;  Service: Cardiovascular;  Laterality: N/A;  . CARDIAC CATHETERIZATION N/A 07/30/2016   Procedure: Coronary Stent Intervention;  Surgeon: William Bush, MD;  Location: [place] CV LAB;  Service: Cardiovascular;  Laterality: N/A;  Mid CFX and MID RCA  . CARDIAC  CATHETERIZATION N/A 07/30/2016   Procedure: Coronary Balloon Angioplasty;  Surgeon: William Bush, MD;  Location: [place] CV LAB;  Service: Cardiovascular;  Laterality: N/A;  OM 1  . CARPAL TUNNEL RELEASE Left   . CATARACT EXTRACTION W/ INTRAOCULAR LENS  IMPLANT, BILATERAL Bilateral ~ 2014  . CERVICAL DISC SURGERY  1990s  . CORONARY ANGIOPLASTY WITH STENT PLACEMENT  05/2013  . CORONARY ANGIOPLASTY WITH STENT PLACEMENT  07/26/2013   DES to RCA extending to PDA    . CORONARY ANGIOPLASTY WITH STENT PLACEMENT  07/30/2016  . CORONARY ANGIOPLASTY WITH STENT PLACEMENT  2000   CAD  . DENTAL SURGERY  04/2016   "got infected; had to dig it out"  . EYE SURGERY    . KNEE ARTHROSCOPY Right 1990's   rt  . LEFT AND RIGHT HEART CATHETERIZATION WITH CORONARY ANGIOGRAM N/A 07/26/2013   Procedure: LEFT AND RIGHT HEART CATHETERIZATION WITH CORONARY ANGIOGRAM;  Surgeon: William Hampshire, MD;  Location: [place] CATH LAB;  Service: Cardiovascular;  Laterality: N/A;  . LEFT HEART CATH AND CORONARY ANGIOGRAPHY N/A 02/26/2017   Procedure:  Left Heart Cath and Coronary Angiography;  Surgeon: William Mocha, MD;  Location: [place] CV LAB;  Service: Cardiovascular;  Laterality: N/A;  . LEFT HEART CATHETERIZATION WITH CORONARY ANGIOGRAM N/A 05/19/2013   Procedure: LEFT HEART CATHETERIZATION WITH CORONARY ANGIOGRAM;  Surgeon: William Dresser, MD;  Location[facility] CATH LAB;  Service: Cardiovascular;  Laterality: N/A;  . LUMBAR LAMINECTOMY/DECOMPRESSION MICRODISCECTOMY  08/30/2012   Procedure: LUMBAR LAMINECTOMY/DECOMPRESSION MICRODISCECTOMY 2 LEVELS;  Surgeon: William Miss, MD;  Location: [place] NEURO ORS;  Service: Neurosurgery;  Laterality: Bilateral;  Bilateral Lumbar three-four Lumbar four-five Laminotomies  . LUMBAR PERCUTANEOUS PEDICLE SCREW 1 LEVEL Bilateral 10/22/2017   Procedure: LUMBAR PERCUTANEOUS PEDICLE SCREW 1 LEVEL;  Surgeon: William Miss, MD;  Location: [place];  Service: Neurosurgery;  Laterality: Bilateral;  . LUMBAR SPINE SURGERY  04/07/2016   Dr. Ellene Hendrix; "?ruptured disc"  . MELANOMA EXCISION Left 02/2011   forearm  . NASAL SINUS SURGERY  1970s   "cut windows in sinus pockets"  . PERCUTANEOUS CORONARY STENT INTERVENTION (PCI-S)  05/19/2013   Procedure: PERCUTANEOUS CORONARY STENT INTERVENTION (PCI-S);  Surgeon: William Dresser, MD;  Location: [facility] CATH LAB;  Service: Cardiovascular;;  . ULNAR TUNNEL RELEASE Left 04/07/2016   Dr. Ellene Hendrix    in for   Chief Complaint  Patient presents with  . Chest Pain     HPI  William Hendrix  is a 77 y.o. male, past medical history significant for coronary artery disease status post stent placement in LAD in 2000, other stents in 2004, 2017.  Patient is status post MI, history of chronic kidney disease stage III, diabetes mellitus type 2 with diabetic neuropathy, presenting with 2 episodes of chest pain that happened yesterday with nausea, cold sweats and shortness of breath.  The first episode that was relieved by nitroglycerin sublingual the second 1 at 10:30 PM was partially relieved  and he presented to the emergency room for treatment.  The patient was treated lately for urinary tract infection with residual mild dysuria and bilateral flank pain.  Patient at this time denies any chest pains or shortness of breath.  He feels tired    Review of Systems    In addition to the HPI above, No Fever-chills, No Headache, No changes with Vision or hearing, No problems swallowing food or Liquids, No Abdominal pain, No Nausea or Vommitting, Bowel movements are regular, No Blood in stool or Urine,  No dysuria, No new skin rashes or bruises, No new joints pains-aches,  No new weakness, tingling, numbness in any extremity, No recent weight gain or loss, No polyuria, polydypsia or polyphagia, No significant Mental Stressors.  A full 10 point Review of Systems was done, except as stated above, all other Review of Systems were negative.   Social History Social History   Tobacco Use  . Smoking status: Former Smoker    Packs/day: 3.00    Years: 30.00    Pack years: 90.00    Types: Cigarettes    Last attempt to quit: 09/17/1996    Years since quitting: 22.0  . Smokeless tobacco: Never Used  Substance Use Topics  . Alcohol use: No     Family History Family History  Problem Relation Age of Onset  . Cancer Mother        intestinal   . Heart disease Mother   . Cancer Father        prostate  . Migraines Daughter   . Leukemia Sister   . Heart disease Sister   . Prostate cancer Unknown   . Kidney cancer Unknown   . Cancer Unknown        Bladder cancer  . Coronary artery disease Unknown   . Hypertension Sister   . Hypertension Brother   . Heart attack Neg Hx   . Stroke Neg Hx      Prior to Admission medications   Medication Sig Start Date End Date Taking? Authorizing Provider  acetaminophen (TYLENOL) 500 MG tablet Take 1,000 mg by mouth 2 (two) times daily.    [provider]  albuterol (PROVENTIL HFA;VENTOLIN HFA) 108 (90 Base) MCG/ACT inhaler Inhale  2 puffs into the lungs every 6 (six) hours as needed for wheezing or shortness of breath. Patient not taking: Reported on 08/29/2018 10/30/16   Saguier, Percell Miller, PA-C  allopurinol (ZYLOPRIM) 300 MG tablet Take 1 tablet (300 mg total) by mouth daily. 08/01/18   William Lukes, MD  aspirin EC 81 MG tablet Take 81 mg by mouth daily. 02/23/11   [name], MD  atorvastatin (LIPITOR) 40 MG tablet Take 1 tablet (40 mg total) by mouth daily. 08/01/18   William Lukes, MD  B Complex Vitamins (B COMPLEX-B12 PO) Take 1 tablet by mouth at bedtime. 07/23/16   [provider]  BD INSULIN SYRINGE ULTRAFINE 31G X 5/16" 1 ML MISC USE  TO INJECT  INSULIN  FIVE  TIMES  DAILY AS INSTRUCTED 03/17/17   [name], MD  chlorpheniramine (CHLOR-TRIMETON) 4 MG tablet Take 4 mg by mouth daily.    [provider]  Cholecalciferol (VITAMIN D) 1000 UNITS capsule Take 1,000 Units by mouth at bedtime.     [provider]  fenofibrate 160 MG tablet Take 1 tablet (160 mg total) by mouth at bedtime. 08/01/18   William Lukes, MD  fluocinonide cream (LIDEX) 3.76 % Apply 1 application topically daily as needed (for rash).  08/11/12   [provider]  fluticasone (FLONASE) 50 MCG/ACT nasal spray Place 2 sprays into both nostrils daily. 08/11/17   Saguier, Percell Miller, PA-C  furosemide (LASIX) 40 MG tablet TAKE 1 AND 1/2 TABLETS EVERY DAY Patient taking differently: Take 60 mg by mouth every day 07/12/17   Lelon Perla, MD  glucose blood (TRUE METRIX BLOOD GLUCOSE TEST) test strip USE TO TEST BLOOD SUGAR THREE TIMES DAILY AS DIRECTED 08/01/18   [name], MD  HYDROcodone-acetaminophen (NORCO/VICODIN) 5-325 MG tablet  Take 1-2 tablets by mouth every 4 (four) hours as needed for severe pain ((score 7 to 10)). 10/25/17   William Miss, MD  insulin NPH Human (NOVOLIN N) 100 UNIT/ML injection Inject 45 units in am and 40 in the evening Patient taking differently: Inject 45 units in am and 50  in the evening 05/09/18   [name], MD  insulin regular (NOVOLIN R RELION) 100 units/mL injection Inject 0.25-0.3 mLs (25-30 Units total) into the skin 3 (three) times daily before meals. Patient taking differently: Inject 35 Units into the skin 3 (three) times daily before meals.  09/17/17   [name], MD  isosorbide mononitrate (IMDUR) 30 MG 24 hr tablet Take 1 tablet (30 mg total) by mouth daily. 05/13/18   Lelon Perla, MD  levofloxacin (LEVAQUIN) 250 MG tablet Take 1 tablet (250 mg total) by mouth every other day. 09/01/18   William Lukes, MD  metoprolol tartrate (LOPRESSOR) 50 MG tablet Take 1 tablet (50 mg total) by mouth 2 (two) times daily. 08/01/18   William Lukes, MD  mupirocin ointment Drue Stager) 2 % Apply to area thin film twice daily 09/29/17   Saguier, Percell Miller, PA-C  nitroGLYCERIN (NITROSTAT) 0.4 MG SL tablet Place 1 tablet (0.4 mg total) under the tongue every 5 (five) minutes as needed for chest pain. 02/16/18   Lelon Perla, MD  pantoprazole (PROTONIX) 20 MG tablet Take 1 tablet (20 mg total) by mouth daily. 08/01/18   William Lukes, MD  Probiotic Product (PROBIOTIC PO) Take 1 capsule by mouth daily.    [provider]  tamsulosin (FLOMAX) 0.4 MG CAPS capsule Take 1 capsule (0.4 mg total) by mouth daily. 08/01/18   William Lukes, MD  tiZANidine (ZANAFLEX) 4 MG capsule Take 1 capsule (4 mg total) by mouth 4 (four) times daily as needed for muscle spasms. Take 1 tablet every 6 hours as needed; not to exceed 3 doses in 24 hours. 02/09/17   William Lukes, MD    Allergies  Allergen Reactions  . Levemir [Insulin Detemir] Swelling and Other (See Comments)    Patient had redness and swelling and tenderness at injection site.  . Codeine Other (See Comments)    Makes him "shakey", is OK with hydrocodone GI UPSET & TREMORS  . Lipitor [Atorvastatin] Other (See Comments)    Muscle aches; liver functions. Patient is currently taken  . Novolog Mix  [Insulin Aspart Prot & Aspart] Other (See Comments)    Causes skin to swell/itch at injection site  . Other Other (See Comments)    Brilinta caused SOB    Physical Exam  Vitals  Blood pressure (!) 166/66, pulse 65, temperature 98.5 F (36.9 C), temperature source Oral, resp. rate 17, height 5\' 10"  (1.778 m), weight 118.4 kg, SpO2 99 %.   1. General well-developed, well-nourished male, extremely pleasant  2. Normal affect and insight, Not Suicidal or Homicidal, Awake Alert, Oriented X 3.  3. No F.N deficits, grossly, patient moving all extremities.  4. Ears and Eyes appear Normal, Conjunctivae clear, PERRLA. Moist Oral Mucosa.  5. Supple Neck, No JVD, No cervical lymphadenopathy appriciated, No Carotid Bruits.  6. Symmetrical Chest wall movement, Good air movement bilaterally, CTAB.  7. RRR, No Gallops, Rubs or Murmurs, No Parasternal Heave.  8. Positive Bowel Sounds, Abdomen Soft, Non tender, .  9.  No Cyanosis, Normal Skin Turgor, No Skin Rash or Bruise.  10. Good muscle tone,  joints appear normal , mild lower extremity  edema.    Data Review  CBC Recent Labs  Lab 09/15/18 0022  WBC 6.3  HGB 12.7*  HCT 38.6*  PLT 153  MCV 94.8  MCH 31.2  MCHC 32.9  RDW 14.1   ------------------------------------------------------------------------------------------------------------------  Chemistries  Recent Labs  Lab 09/12/18 0815 09/15/18 0022  NA 145 138  K 4.6 3.8  CL 104 104  CO2 31 25  GLUCOSE 144* 221*  BUN 33* 34*  CREATININE 1.66* 1.73*  CALCIUM 10.6* 9.6  AST 17  --   ALT 18  --   ALKPHOS 49  --   BILITOT 0.5  --    ------------------------------------------------------------------------------------------------------------------ estimated creatinine clearance is 45.4 mL/min (A) (by C-G formula based on SCr of 1.73 mg/dL (H)). ------------------------------------------------------------------------------------------------------------------ No  results for input(s): TSH, T4TOTAL, T3FREE, THYROIDAB in the last 72 hours.  Invalid input(s): FREET3   Coagulation profile No results for input(s): INR, PROTIME in the last 168 hours. ------------------------------------------------------------------------------------------------------------------- No results for input(s): DDIMER in the last 72 hours. -------------------------------------------------------------------------------------------------------------------  Cardiac Enzymes Recent Labs  Lab 09/15/18 0022 09/15/18 0240  TROPONINI <0.03 <0.03   ------------------------------------------------------------------------------------------------------------------ Invalid input(s): POCBNP   ---------------------------------------------------------------------------------------------------------------  Urinalysis    Component Value Date/Time   COLORURINE YELLOW 09/15/2018 0135   APPEARANCEUR CLEAR 09/15/2018 0135   LABSPEC 1.015 09/15/2018 0135   PHURINE 6.5 09/15/2018 0135   GLUCOSEU NEGATIVE 09/15/2018 0135   GLUCOSEU NEGATIVE 08/29/2018 1148   HGBUR NEGATIVE 09/15/2018 0135   BILIRUBINUR NEGATIVE 09/15/2018 0135   BILIRUBINUR neg 11/15/2017 1720   KETONESUR NEGATIVE 09/15/2018 0135   PROTEINUR NEGATIVE 09/15/2018 0135   UROBILINOGEN 0.2 08/29/2018 1148   NITRITE NEGATIVE 09/15/2018 0135   LEUKOCYTESUR NEGATIVE 09/15/2018 0135    ----------------------------------------------------------------------------------------------------------------   Imaging results:   Dg Chest 2 View  Result Date: 09/15/2018 CLINICAL DATA:  Chest pain EXAM: CHEST - 2 VIEW COMPARISON:  07/25/2018 FINDINGS: Heart is mildly enlarged. Lungs clear. No effusions or edema. No acute bony abnormality. IMPRESSION: Mild cardiomegaly.  No active disease. Electronically Signed   By: Rolm Baptise M.D.   On: 09/15/2018 00:05    My personal review of EKG: Normal sinus rhythm at 72 bpm with left  anterior fascicular block  Assessment & Plan  Chest pain Serial troponins Cardiology consult History of coronary artery disease status post stent to LAD in year 2000 then in 2014 and in 2017 Status post MI in the past Continue with Imdur  Chronic kidney disease stage III, creatinine 1.73, monitor  Chronic systolic/diastolic congestive heart failure Continue with Lasix, last echocardiogram done in 2017 shows ejection fraction 45 to 50% with grade 1 diastolic dysfunction. Clinically compensated  Diabetes mellitus with diabetic neuropathy Insulin sliding scale/NPH insulin  Hyperlipidemia, continue with statin  Gout continue with allopurinol  Hypertension continue with metoprolol  GERD continue with proton pump inhibitor    DVT Prophylaxis Lovenox  AM Labs Ordered, also please review Full Orders  Family Communication: Admission, patients condition and plan of care including tests being ordered have been discussed with the patient and wife who indicate understanding and agree with the plan and Code Status.  Code Status full  Disposition Plan: Home  Time spent in minutes : 41 minutes  Condition GUARDED   @SIGNATURE @

## 2018-09-15 NOTE — ED Notes (Signed)
Attempted report X1

## 2018-09-15 NOTE — Telephone Encounter (Signed)
Copied from [place] [id number]. Topic: General - Other >> Sep 14, 2018  4:39 PM Ivar Drape wrote: Reason for CRM:   Patient would like a call back with the results of his recent labs   Spoke with patients spouse about his results.

## 2018-09-15 NOTE — ED Notes (Signed)
Pt c/o sharp pain mid sternal that radiates bilaterally. Denies ShOB, nausea, diaphoresis.

## 2018-09-15 NOTE — ED Notes (Signed)
Pt states he does not want another nitroglycerin at this time  States pain is unchanged  States he just does not feel well

## 2018-09-15 NOTE — ED Notes (Signed)
Attempted report X2 

## 2018-09-16 ENCOUNTER — Inpatient Hospital Stay (HOSPITAL_COMMUNITY): Payer: Medicare HMO

## 2018-09-16 ENCOUNTER — Encounter (HOSPITAL_COMMUNITY): Payer: Self-pay | Admitting: *Deleted

## 2018-09-16 DIAGNOSIS — I249 Acute ischemic heart disease, unspecified: Secondary | ICD-10-CM

## 2018-09-16 DIAGNOSIS — I5031 Acute diastolic (congestive) heart failure: Secondary | ICD-10-CM

## 2018-09-16 DIAGNOSIS — I370 Nonrheumatic pulmonary valve stenosis: Secondary | ICD-10-CM

## 2018-09-16 DIAGNOSIS — I361 Nonrheumatic tricuspid (valve) insufficiency: Secondary | ICD-10-CM

## 2018-09-16 DIAGNOSIS — I200 Unstable angina: Secondary | ICD-10-CM

## 2018-09-16 DIAGNOSIS — R0789 Other chest pain: Secondary | ICD-10-CM

## 2018-09-16 LAB — GLUCOSE, CAPILLARY
Glucose-Capillary: 188 mg/dL — ABNORMAL HIGH (ref 70–99)
Glucose-Capillary: 225 mg/dL — ABNORMAL HIGH (ref 70–99)

## 2018-09-16 LAB — LIPID PANEL
Cholesterol: 134 mg/dL (ref 0–200)
HDL: 25 mg/dL — ABNORMAL LOW (ref >40)
LDL Cholesterol: 66 mg/dL (ref 0–99)
Total CHOL/HDL Ratio: 5.4 RATIO
Triglycerides: 213 mg/dL — ABNORMAL HIGH (ref <150)
VLDL: 43 mg/dL — ABNORMAL HIGH (ref 0–40)

## 2018-09-16 LAB — TROPONIN I

## 2018-09-16 LAB — NM MYOCAR MULTI W/SPECT W/WALL MOTION / EF
CSEPPHR: 82 {beats}/min
MPHR: 142 {beats}/min
Percent HR: 57 %
Rest HR: 60 {beats}/min

## 2018-09-16 LAB — CREATININE, SERUM
Creatinine, Ser: 1.7 mg/dL — ABNORMAL HIGH (ref 0.61–1.24)
GFR calc Af Amer: 44 mL/min — ABNORMAL LOW (ref >60)
GFR calc non Af Amer: 38 mL/min — ABNORMAL LOW (ref >60)

## 2018-09-16 LAB — ECHOCARDIOGRAM COMPLETE
Height: 70 in
Weight: 4075.2 oz

## 2018-09-16 MED ORDER — TIZANIDINE HCL 4 MG PO TABS
4.0000 mg | ORAL_TABLET | Freq: Two times a day (BID) | ORAL | Status: DC
  Administered 2018-09-16: 4 mg via ORAL
  Filled 2018-09-16 (×2): qty 1

## 2018-09-16 MED ORDER — INSULIN NPH (HUMAN) (ISOPHANE) 100 UNIT/ML ~~LOC~~ SUSP
20.0000 [IU] | Freq: Every day | SUBCUTANEOUS | Status: DC
  Filled 2018-09-16: qty 10

## 2018-09-16 MED ORDER — HYDROCODONE-ACETAMINOPHEN 10-325 MG PO TABS
0.5000 | ORAL_TABLET | Freq: Two times a day (BID) | ORAL | Status: DC
  Administered 2018-09-16: 0.5 via ORAL
  Filled 2018-09-16: qty 1

## 2018-09-16 MED ORDER — TECHNETIUM TC 99M TETROFOSMIN IV KIT
30.0000 | PACK | Freq: Once | INTRAVENOUS | Status: AC | PRN
  Administered 2018-09-16: 30 via INTRAVENOUS

## 2018-09-16 MED ORDER — REGADENOSON 0.4 MG/5ML IV SOLN
0.4000 mg | Freq: Once | INTRAVENOUS | Status: AC
  Administered 2018-09-16: 0.4 mg via INTRAVENOUS
  Filled 2018-09-16: qty 5

## 2018-09-16 MED ORDER — REGADENOSON 0.4 MG/5ML IV SOLN
INTRAVENOUS | Status: AC
  Filled 2018-09-16: qty 5

## 2018-09-16 MED ORDER — TECHNETIUM TC 99M TETROFOSMIN IV KIT
10.0000 | PACK | Freq: Once | INTRAVENOUS | Status: AC | PRN
  Administered 2018-09-16: 10 via INTRAVENOUS

## 2018-09-16 MED ORDER — PERFLUTREN LIPID MICROSPHERE
1.0000 mL | INTRAVENOUS | Status: AC | PRN
  Administered 2018-09-16: 3 mL via INTRAVENOUS
  Filled 2018-09-16: qty 10

## 2018-09-16 NOTE — Discharge Summary (Signed)
Physician Discharge Summary  William Hendrix YBW:[id number] DOB: October 09, 1941 DOA: 09/14/2018  PCP: [name], MD  Admit date: 09/14/2018 Discharge date: 09/16/2018  Time spent: 35 minutes  Recommendations for Outpatient Follow-up:  1. Recommend continue all meds and get Chem-12 and CBC in about 1 month  Discharge Diagnoses:  Active Problems:   Chest pain   Acute diastolic CHF (congestive heart failure) [facility])   Discharge Condition: Improved  Diet recommendation: Heart healthy  Filed Weights   09/14/18 2333 09/16/18 0447  Weight: 118.4 kg 115.5 kg    History of present illness:  77 year old male history of CAD history of PCI DES placement 2017-cardiac cath 05/2017 showed patent stent no new obvious findings, spondylosis and radiculopathy L2-3-L3-4 L4-5 DM TY 2 morbid obesity, chronic combined heart failure EF 45-50% Abdominal aortic aneurysm OSA on CPAP Admitted with chest pain shortness of breath-troponins negative EKG negative chest x-ray showed cardiomegaly without congestion  He was seen by cardiology on consult and felt that he had significant risk factors that he warranted a stress test which was done which was negative echocardiogram results were done this admission which showed EF of 55 to 76% diastolic dysfunction which was euvolemic  Note that I do not think he had a pressure ulcer this admission He was stabilized for discharge and felt stable for discharge home by cardiology and was discharged   Discharge Exam: Vitals:   09/16/18 1138 09/16/18 1140  BP: 117/86 117/65  Pulse: 73 73  Resp:    Temp:    SpO2:      General: Awake alert pleasant no distress Cardiovascular: S1-S2 no murmur rub or gallop Respiratory: Clear sclera no added sound Abdomen soft nontender no rebound no guarding  Discharge Instructions   Discharge Instructions    Diet - low sodium heart healthy   Complete by:  As directed    Discharge instructions   Complete by:  As directed     You were admitted to the hospital and it was felt that he did not have overt heart failure or a heart attack based on the stress test please continue all your regular medications without change and follow-up with your primary care physician and cardiologist   Increase activity slowly   Complete by:  As directed      Allergies as of 09/16/2018      Reactions   Brilinta [ticagrelor] Shortness Of Breath   Levemir [insulin Detemir] Swelling, Other (See Comments)   Patient had redness and swelling and tenderness at injection site.   Codeine Other (See Comments)   Makes him "shakey", is OK with hydrocodone GI UPSET & TREMORS   Lipitor [atorvastatin] Other (See Comments)   Muscle aches; affects liver function- Patient is currently taking   Novolog Mix [insulin Aspart Prot & Aspart] Other (See Comments)   Causes skin to swell/itch at injection site      Medication List    TAKE these medications   acetaminophen 500 MG tablet Commonly known as:  TYLENOL Take 500 mg by mouth 2 (two) times daily.   albuterol 108 (90 Base) MCG/ACT inhaler Commonly known as:  PROVENTIL HFA;VENTOLIN HFA Inhale 2 puffs into the lungs every 6 (six) hours as needed for wheezing or shortness of breath.   allopurinol 300 MG tablet Commonly known as:  ZYLOPRIM Take 1 tablet (300 mg total) by mouth daily.   aspirin EC 81 MG tablet Take 81 mg by mouth daily.   atorvastatin 40 MG tablet Commonly known as:  LIPITOR Take 1 tablet (40 mg total) by mouth daily.   B COMPLEX-B12 PO Take 1 tablet by mouth every 7 (seven) days.   BD INSULIN SYRINGE ULTRAFINE 31G X 5/16" 1 ML Misc Generic drug:  Insulin Syringe-Needle U-100 USE  TO INJECT  INSULIN  FIVE  TIMES  DAILY AS INSTRUCTED   chlorpheniramine 4 MG tablet Commonly known as:  CHLOR-TRIMETON Take 4 mg by mouth daily.   fenofibrate 160 MG tablet Take 1 tablet (160 mg total) by mouth at bedtime.   fluocinonide cream 0.05 % Commonly known as:  LIDEX Apply  1 application topically daily as needed (for rashes).   fluticasone 50 MCG/ACT nasal spray Commonly known as:  FLONASE Place 2 sprays into both nostrils daily. What changed:    when to take this  reasons to take this   furosemide 40 MG tablet Commonly known as:  LASIX TAKE 1 AND 1/2 TABLETS EVERY DAY What changed:  See the new instructions.   glucose blood test strip USE TO TEST BLOOD SUGAR THREE TIMES DAILY AS DIRECTED   HYDROcodone-acetaminophen 10-325 MG tablet Commonly known as:  NORCO Take 0.5 tablets by mouth 2 (two) times daily.   insulin NPH Human 100 UNIT/ML injection Commonly known as:  HUMULIN N,NOVOLIN N Inject 45 units in am and 40 in the evening What changed:    how much to take  how to take this  when to take this  additional instructions   insulin regular 100 units/mL injection Commonly known as:  NOVOLIN R,HUMULIN R Inject 0.25-0.3 mLs (25-30 Units total) into the skin 3 (three) times daily before meals. What changed:  how much to take   isosorbide mononitrate 30 MG 24 hr tablet Commonly known as:  IMDUR Take 1 tablet (30 mg total) by mouth daily.   MAGNESIUM PO Take 1 tablet by mouth at bedtime.   metoprolol tartrate 50 MG tablet Commonly known as:  LOPRESSOR Take 1 tablet (50 mg total) by mouth 2 (two) times daily.   nitroGLYCERIN 0.4 MG SL tablet Commonly known as:  NITROSTAT Place 1 tablet (0.4 mg total) under the tongue every 5 (five) minutes as needed for chest pain.   pantoprazole 20 MG tablet Commonly known as:  PROTONIX Take 1 tablet (20 mg total) by mouth daily. What changed:  when to take this   PROBIOTIC PO Take 1 capsule by mouth at bedtime.   tamsulosin 0.4 MG Caps capsule Commonly known as:  FLOMAX Take 1 capsule (0.4 mg total) by mouth daily.   tiZANidine 4 MG tablet Commonly known as:  ZANAFLEX Take 4 mg by mouth 2 (two) times daily.   Vitamin D3 25 MCG (1000 UT) Caps Take 1,000 Units by mouth at bedtime.       Allergies  Allergen Reactions  . Brilinta [Ticagrelor] Shortness Of Breath  . Levemir [Insulin Detemir] Swelling and Other (See Comments)    Patient had redness and swelling and tenderness at injection site.  . Codeine Other (See Comments)    Makes him "shakey", is OK with hydrocodone GI UPSET & TREMORS  . Lipitor [Atorvastatin] Other (See Comments)    Muscle aches; affects liver function- Patient is currently taking  . Novolog Mix [Insulin Aspart Prot & Aspart] Other (See Comments)    Causes skin to swell/itch at injection site      The results of significant diagnostics from this hospitalization (including imaging, microbiology, ancillary and laboratory) are listed below for reference.    Significant Diagnostic  Studies: Dg Chest 2 View  Result Date: 09/15/2018 CLINICAL DATA:  Chest pain EXAM: CHEST - 2 VIEW COMPARISON:  07/25/2018 FINDINGS: Heart is mildly enlarged. Lungs clear. No effusions or edema. No acute bony abnormality. IMPRESSION: Mild cardiomegaly.  No active disease. Electronically Signed   By: Rolm Baptise M.D.   On: 09/15/2018 00:05   Nm Myocar Multi W/spect W/wall Motion / Ef  Result Date: 09/16/2018  There was no ST segment deviation noted during stress.  The study is normal.  This is a low risk study.  The left ventricular ejection fraction is normal (55-65%).  Norma pharmacologic nuclear stress test with no evidence for a prior infarct or ischemia.    Microbiology: No results found for this or any previous visit (from the past 240 hour(s)).   Labs: Basic Metabolic Panel: Recent Labs  Lab 09/12/18 0815 09/15/18 0022 09/16/18 0237  NA 145 138  --   K 4.6 3.8  --   CL 104 104  --   CO2 31 25  --   GLUCOSE 144* 221*  --   BUN 33* 34*  --   CREATININE 1.66* 1.73* 1.70*  CALCIUM 10.6* 9.6  --    Liver Function Tests: Recent Labs  Lab 09/12/18 0815  AST 17  ALT 18  ALKPHOS 49  BILITOT 0.5  PROT 7.0  ALBUMIN 4.1   No results for input(s):  LIPASE, AMYLASE in the last 168 hours. No results for input(s): AMMONIA in the last 168 hours. CBC: Recent Labs  Lab 09/15/18 0022  WBC 6.3  HGB 12.7*  HCT 38.6*  MCV 94.8  PLT 153   Cardiac Enzymes: Recent Labs  Lab 09/15/18 0022 09/15/18 0240 09/15/18 1502 09/15/18 2029 09/16/18 0237  TROPONINI <0.03 <0.03 <0.03 <0.03 <0.03   BNP: BNP (last 3 results) Recent Labs    09/15/18 0022  BNP 17.4    ProBNP (last 3 results) No results for input(s): PROBNP in the last 8760 hours.  CBG: Recent Labs  Lab 09/15/18 1420 09/15/18 2117 09/16/18 0735 09/16/18 1435  GLUCAP 143* 252* 188* 225*       Signed:  Nita Sells MD   [facility] 09/16/2018, 3:00 PM

## 2018-09-16 NOTE — Progress Notes (Signed)
[facility] PROGRESS NOTE  William Hendrix YNW:[id number] DOB: [birth year] DOA: 09/14/2018 PCP: [name], MD   Narrative: 77 year old male history of CAD history of PCI DES placement 2017-cardiac cath 05/2017 showed patent stent no new obvious findings, spondylosis and radiculopathy L2-3-L3-4 L4-5 DM TY 2 morbid obesity, chronic combined heart failure EF 45-50% Abdominal aortic aneurysm OSA on CPAP Admitted with chest pain shortness of breath-troponins negative EKG negative chest x-ray showed cardiomegaly without congestion   A & Plan Chest pain-history of PCI DES placement 2017 with relatively normal cath 2018-EKGs and troponins are negative-follow echo-continue metoprolol 50 twice daily, Imdur 30 daily-going for cardiac cath at this time Decompensated acute superimposed on chronic combined heart failure-patient getting Lasix 60 mg daily which is usual home dose-Ins and outs and monitor Diabetes mellitus 2-takes NPH 40 5 in the morning and 40 in the evening I have down adjusted his morning dose to 20 units as he is n.p.o. he can continue sliding scale coverage-sugars are 143-252 now Hyperlipidemia continue fenofibrate 160 at bedtime, Lipitor 40 daily Gout continue allopurinol 300 daily Chronic low back pain with radiculopathy-continue opiates Norco 5/325 in addition to tizanidine 4 mg twice daily BPH continue Flomax 0.4 daily Reflux continue pantoprazole 20 daily-stable at this time Morbid obesity-needs outpatient weight loss strategies possible Sacral decubitus on buttocks  DVT prophylaxis: Lovenox code Status: Full code family Communication: No family disposition Plan: Inpatient   Shaquina Gillham, MD  [facility] Direct contact: [phone] --Via amion app OR  --[email or web address]; password [email or web address]  7PM-7AM contact night coverage as above 09/16/2018, 8:50 AM  LOS: 1 day   Consultants:  Cardiology  Procedures:  No  Antimicrobials:  No  Interval  history/Subjective: Awake alert no overt chest pain at this time n.p.o. and getting echo when I was seeing him  Objective:  Vitals:  Vitals:   09/16/18 0447 09/16/18 0838  BP: (!) 151/64 (!) 154/69  Pulse: 60 62  Resp:    Temp: 97.8 F (36.6 C)   SpO2: 99%     Exam:  . EOMI NCAT thick neck . S1-S2 no murmur slightly tachycardic . Chest is clear . Abdomen is obese nontender no rebound no guarding . No lower extremity edema . Power is 5/5   I have personally reviewed the following:   Labs:  troponins neg  Imaging studies:  n  Medical tests:  n   Test discussed with performing physician:  n  Decision to obtain old records:  n  Review and summation of old records:  n  Scheduled Meds: . allopurinol  300 mg Oral Daily  . aspirin EC  81 mg Oral Daily  . atorvastatin  40 mg Oral Daily  . enoxaparin (LOVENOX) injection  40 mg Subcutaneous Q24H  . fenofibrate  160 mg Oral QHS  . furosemide  60 mg Oral Daily  . insulin aspart  0-5 Units Subcutaneous QHS  . insulin aspart  0-9 Units Subcutaneous TID WC  . [START ON 09/17/2018] insulin NPH Human  20 Units Subcutaneous QAC breakfast  . insulin NPH Human  50 Units Subcutaneous QAC supper  . isosorbide mononitrate  30 mg Oral Daily  . metoprolol tartrate  50 mg Oral BID  . pantoprazole  20 mg Oral Daily  . sodium chloride flush  3 mL Intravenous Q12H  . tamsulosin  0.4 mg Oral Daily   Continuous Infusions: . sodium chloride      Active Problems:   Chest pain   LOS: 1 day

## 2018-09-16 NOTE — Progress Notes (Signed)
   [name] presented for a nuclear stress test today.  No immediate complications.  Stress imaging is pending at this time.  Preliminary EKG findings may be listed in the chart, but the stress test result will not be finalized until perfusion imaging is complete.  Charlie Pitter, PA-C 09/16/2018, 11:39 AM

## 2018-09-16 NOTE — Progress Notes (Signed)
  Echocardiogram 2D Echocardiogram with definity has been performed.  Darlina Sicilian M 09/16/2018, 9:22 AM

## 2018-09-16 NOTE — Consult Note (Signed)
   [facility] CM Inpatient Consult   09/16/2018  ZACKARIAH VANDERPOL 1941-04-24 [id number]     Patient screened for potential [facility] Care Management services due to unplanned readmission risk score of 23% (high).  Went to bedside to discuss [place] Management program services. However, Mr. Mathieson was in the restroom.  Will follow up at later time for potential [facility] Care Management needs.   Marthenia Rolling, MSN-Ed, RN,BSN [facility] Liaison [phone]

## 2018-09-16 NOTE — Progress Notes (Signed)
Progress Note  Patient Name: William Hendrix Date of Encounter: 09/16/2018  Primary Cardiologist: Kirk Ruths, MD   Subjective   No complaints this morning besides hunger. Denies recurrent chest pain. No SOB or palpitations.   Inpatient Medications    Scheduled Meds: . allopurinol  300 mg Oral Daily  . aspirin EC  81 mg Oral Daily  . atorvastatin  40 mg Oral Daily  . enoxaparin (LOVENOX) injection  40 mg Subcutaneous Q24H  . fenofibrate  160 mg Oral QHS  . furosemide  60 mg Oral Daily  . insulin aspart  0-5 Units Subcutaneous QHS  . insulin aspart  0-9 Units Subcutaneous TID WC  . [START ON 09/17/2018] insulin NPH Human  20 Units Subcutaneous QAC breakfast  . insulin NPH Human  50 Units Subcutaneous QAC supper  . isosorbide mononitrate  30 mg Oral Daily  . metoprolol tartrate  50 mg Oral BID  . pantoprazole  20 mg Oral Daily  . sodium chloride flush  3 mL Intravenous Q12H  . tamsulosin  0.4 mg Oral Daily   Continuous Infusions: . sodium chloride     PRN Meds: sodium chloride, acetaminophen **OR** acetaminophen, albuterol, nitroGLYCERIN, ondansetron **OR** ondansetron (ZOFRAN) IV, sodium chloride flush, zolpidem   Vital Signs    Vitals:   09/15/18 1142 09/15/18 2116 09/16/18 0447 09/16/18 0838  BP: (!) 166/66 (!) 149/74 (!) 151/64 (!) 154/69  Pulse: 65 73 60 62  Resp:      Temp: 98.5 F (36.9 C) 98.6 F (37 C) 97.8 F (36.6 C)   TempSrc: Oral Oral Oral   SpO2: 99% 97% 99%   Weight:   115.5 kg   Height:        Intake/Output Summary (Last 24 hours) at 09/16/2018 0905 Last data filed at 09/15/2018 2148 Gross per 24 hour  Intake 243 ml  Output -  Net 243 ml   Filed Weights   09/14/18 2333 09/16/18 0447  Weight: 118.4 kg 115.5 kg    Telemetry    Sinus rhythm - Personally Reviewed  Physical Exam   GEN: Laying in bed in no acute distress.   Neck: No JVD, no carotid bruits Cardiac: RRR, no murmurs, rubs, or gallops.  Respiratory: Clear to  auscultation bilaterally, no wheezes/ rales/ rhonchi GI: NABS, Soft, obese, nontender, non-distended  MS: No edema; No deformity. Neuro:  Nonfocal, moving all extremities spontaneously Psych: Normal affect   Labs    Chemistry Recent Labs  Lab 09/12/18 0815 09/15/18 0022  NA 145 138  K 4.6 3.8  CL 104 104  CO2 31 25  GLUCOSE 144* 221*  BUN 33* 34*  CREATININE 1.66* 1.73*  CALCIUM 10.6* 9.6  PROT 7.0  --   ALBUMIN 4.1  --   AST 17  --   ALT 18  --   ALKPHOS 49  --   BILITOT 0.5  --   GFRNONAA  --  37*  GFRAA  --  43*  ANIONGAP  --  9     Hematology Recent Labs  Lab 09/15/18 0022  WBC 6.3  RBC 4.07*  HGB 12.7*  HCT 38.6*  MCV 94.8  MCH 31.2  MCHC 32.9  RDW 14.1  PLT 153    Cardiac Enzymes Recent Labs  Lab 09/15/18 0240 09/15/18 1502 09/15/18 2029 09/16/18 0237  TROPONINI <0.03 <0.03 <0.03 <0.03   No results for input(s): TROPIPOC in the last 168 hours.   BNP Recent Labs  Lab 09/15/18 0022  BNP  17.4     DDimer No results for input(s): DDIMER in the last 168 hours.   Radiology    Dg Chest 2 View  Result Date: 09/15/2018 CLINICAL DATA:  Chest pain EXAM: CHEST - 2 VIEW COMPARISON:  07/25/2018 FINDINGS: Heart is mildly enlarged. Lungs clear. No effusions or edema. No acute bony abnormality. IMPRESSION: Mild cardiomegaly.  No active disease. Electronically Signed   By: Rolm Baptise M.D.   On: 09/15/2018 00:05    Cardiac Studies   Left heart catheterization 02/2017: 1. Three-vessel coronary artery disease with continued patency of the stented segments in the right coronary artery, left circumflex, and LAD 2. Moderately elevated LVEDP  Recommendations: The patient's angiogram as compared to his prior cath films from last year when he underwent 2 vessel PCI of the right coronary artery and left circumflex. His stents are widely patent and there is no change in coronary anatomy. I do not appreciate any obstructive disease. He should continue with  medical therapy and work on lifestyle modification.  Echocardiogram 2017: Study Conclusions  - Left ventricle: The cavity size was normal. Wall thickness was increased in a pattern of mild LVH. Systolic function was mildly reduced. The estimated ejection fraction was in the range of 45% to 50%. Doppler parameters are consistent with abnormal left ventricular relaxation (grade 1 diastolic dysfunction). - Right ventricle: The cavity size was mildly dilated. Systolic function was mildly reduced. - Pericardium, extracardiac: A trivial pericardial effusion was identified. - Impressions: Poor acoustic windows limit study, even with the use of Definity.  Impressions:  - Poor acoustic windows limit study, even with the use of Definity.  Patient Profile     77 y.o. male with a PMH of CAD s/p multiple PCI/DES (last LHC 2018 with patent LAD, RCA, and LCx stents), chronic combined CHF (EF 45-50%, G1DD on last echo 2017), HTN, HLD, DM type 2, OSA on CPAP, and AAA who is being followed by cardiology for the evaluation of chest pain  Assessment & Plan    1. Chest pain in patient with multivessel CAD s/p multiple PCI/DES (last 2017): patient presented with 2 episodes of non-exertional chest pain 09/14/18 which were relieved with SL nitro. Trops negative. EKG non-ischemic. Echo reviewed by Dr. Debara Pickett and EF appears stable/improved from previous - Will plan for NST to further evaluate for ischemic cause - Continue aspirin and statin  2. Chronic systolic CHF: reports ~16XW weight gain in 3 weeks with intermittent SOB and LE edema. Possible he has had some dietary indiscretion recently while visiting his daughter in the hospital last week in [facility]. CXR is without edema and BNP wnl this admission. He appears euvolemic on exam - Will follow-up echo - Continue metoprolol and lasix - Not on ACEi/ARB due to CKD.   3. HTN: BP remains elevated today in the setting of missing his home  medications x24 hours. He reports good control at home prior to hospitalization - Continue metoprolol and lasix for now  4. HLD/hypertiglyceridemia: LDL 69, triglycerides 286 on lipid panel 01/2018 - Continue atorvastatin 40mg  daily - Continue fenofibrate  5. DM type 2: A1C 6.1 04/2018; at goal of <7 - Continue management per primary team   6. CKD stage 3: Cr 1.7 09/15/18 (baseline appears to be 1.5-1.6) - Continue to monitor closely   7. AAA: noted to be 3.1x3.1cm on last [place] 12/2016. - Recommended for surveillance [place] 11/2018    For questions or updates, please contact [place] Please consult [email or web address] for contact  info under Cardiology/STEMI.      Signed, Abigail Butts, PA-C  09/16/2018, 9:05 AM   [phone]

## 2018-09-16 NOTE — Progress Notes (Signed)
Patient received discharge information and acknowledged understanding of it. Patient IV was removed.  

## 2018-09-18 ENCOUNTER — Telehealth: Payer: Self-pay | Admitting: Cardiology

## 2018-09-18 NOTE — Telephone Encounter (Signed)
Called in 2 week Rx for Imdur 30mg  and Lasix 60 mg to [place]- pt waiting for mail order Rx to arrive.  Kerin Ransom PA-C 09/18/2018 11:57 AM

## 2018-09-19 ENCOUNTER — Other Ambulatory Visit: Payer: Self-pay | Admitting: Cardiology

## 2018-09-19 ENCOUNTER — Telehealth: Payer: Self-pay

## 2018-09-19 NOTE — Telephone Encounter (Signed)
Called patient to schedule Hospital follow up visit. Wife states patient needs appointment with Dr. Stanford Breed instead.

## 2018-09-21 NOTE — Telephone Encounter (Signed)
Telephone   09/18/2018 [facility] Scissors, PA-C  Cardiology   Conversation  (Newest Message First)  Ilean China       09/18/18 11:56 AM  Note    Called in 2 week Rx for Imdur 30mg  and Lasix 60 mg to [place] [phone] pt waiting for mail order Rx to arrive.  Kerin Ransom PA-C 09/18/2018 11:57 AM           09/18/18 11:51 AM    Jackie Plum" contacted Erlene Quan, PA-C   Additional Documentation   Encounter Info:   Billing Info,   History,   Allergies,   Detailed Report

## 2018-09-23 ENCOUNTER — Institutional Professional Consult (permissible substitution): Payer: Medicare HMO | Admitting: Pulmonary Disease

## 2018-09-26 ENCOUNTER — Other Ambulatory Visit: Payer: Self-pay | Admitting: *Deleted

## 2018-09-26 DIAGNOSIS — R072 Precordial pain: Secondary | ICD-10-CM

## 2018-09-26 MED ORDER — ISOSORBIDE MONONITRATE ER 30 MG PO TB24: 30.0000 mg | ORAL_TABLET | Freq: Every day | ORAL | 3 refills | Status: DC

## 2018-09-26 MED ORDER — FUROSEMIDE 40 MG PO TABS: ORAL_TABLET | ORAL | 3 refills | Status: DC

## 2018-10-10 ENCOUNTER — Ambulatory Visit: Payer: Self-pay

## 2018-10-10 NOTE — Telephone Encounter (Signed)
Incoming call from Patient  With complaint about her husband having  congestion , coughing  up yellow mucous .  Denies that he has a fever. States that the cough began yesterday morning.  States that it has gotten worse in 24 hours.  .  Denies Hemoptysis Patient states that just got out of the hospital last week.  Does have a History of cardiac issues.  States his "nose is running like a water  spigot." Patient scheduled for appointment on 10/13/2018 @ 0940 am.  Instructed Patient to arriive 15 minutes prior  appointment. Patient's wife voiced understanding. Provided care advice , wife voiced understanding.                                                                                                                                                                                                                                                                                                                                      Reason for Disposition . SEVERE coughing spells (e.g., whooping sound after coughing, vomiting after coughing)  Answer Assessment - Initial Assessment Questions 1. ONSET: "When did the cough begin?"      Yesterday merning' 2. SEVERITY: "How bad is the cough today?"       Worse in 24 hours  3. RESPIRATORY DISTRESS: "Describe your breathing."       Taking deep breaths evevrything 4. FEVER: "Do you have a fever?" If so, ask: "What is your temperature, how was it measured, and when did it start?"     98.2 5. SPUTUM: "Describe the color of your sputum" (clear, white, yellow, green)     yellow 6. HEMOPTYSIS: "Are you coughing up any blood?" If so ask: "How much?" (flecks, streaks, tablespoons, etc.)     no 7. CARDIAC HISTORY: "Do you have any history of heart disease?" (e.g., heart attack, congestive heart failure)      Yes  Just got out the  hospital 8. LUNG HISTORY: "Do you have any history of lung disease?"  (e.g., pulmonary embolus, asthma, emphysema)     No  Occasional  breathing  On  CPAP 9. PE RISK FACTORS: "Do you have a history of blood clots?" (or: recent major surgery, recent prolonged travel, bedridden)     No  10. OTHER SYMPTOMS: "Do you have any other symptoms?" (e.g., runny nose, wheezing, chest pain)       Nose running  " water spicut  11. PREGNANCY: "Is there any chance you are pregnant?" "When was your last menstrual period?"       na 12. TRAVEL: "Have you traveled out of the country in the last month?" (e.g., travel history, exposures)       na  Protocols used: [place]

## 2018-10-13 ENCOUNTER — Ambulatory Visit (HOSPITAL_BASED_OUTPATIENT_CLINIC_OR_DEPARTMENT_OTHER)
Admission: RE | Admit: 2018-10-13 | Discharge: 2018-10-13 | Disposition: A | Discharge status: Home / Self Care | Payer: Medicare HMO | Source: Ambulatory Visit | Attending: Medical | Admitting: Medical

## 2018-10-13 ENCOUNTER — Encounter: Payer: Self-pay | Admitting: Medical

## 2018-10-13 ENCOUNTER — Ambulatory Visit (INDEPENDENT_AMBULATORY_CARE_PROVIDER_SITE_OTHER): Payer: Medicare HMO | Admitting: Medical

## 2018-10-13 VITALS — BP 105/68 | HR 59 | Temp 98.2°F | Resp 16 | Ht 70.0 in | Wt 257.4 lb

## 2018-10-13 DIAGNOSIS — M25552 Pain in left hip: Secondary | ICD-10-CM

## 2018-10-13 DIAGNOSIS — R059 Cough, unspecified: Secondary | ICD-10-CM

## 2018-10-13 DIAGNOSIS — R05 Cough: Secondary | ICD-10-CM | POA: Insufficient documentation

## 2018-10-13 DIAGNOSIS — J111 Influenza due to unidentified influenza virus with other respiratory manifestations: Secondary | ICD-10-CM | POA: Diagnosis not present

## 2018-10-13 DIAGNOSIS — Z8679 Personal history of other diseases of the circulatory system: Secondary | ICD-10-CM

## 2018-10-13 DIAGNOSIS — R0989 Other specified symptoms and signs involving the circulatory and respiratory systems: Secondary | ICD-10-CM | POA: Diagnosis not present

## 2018-10-13 DIAGNOSIS — R5383 Other fatigue: Secondary | ICD-10-CM

## 2018-10-13 LAB — COMPREHENSIVE METABOLIC PANEL
ALT: 16 U/L (ref 0–53)
AST: 14 U/L (ref 0–37)
Albumin: 3.9 g/dL (ref 3.5–5.2)
Alkaline Phosphatase: 51 U/L (ref 39–117)
BUN: 26 mg/dL — ABNORMAL HIGH (ref 6–23)
CO2: 32 mEq/L (ref 19–32)
Calcium: 10 mg/dL (ref 8.4–10.5)
Chloride: 101 mEq/L (ref 96–112)
Creatinine, Ser: 1.56 mg/dL — ABNORMAL HIGH (ref 0.40–1.50)
GFR: 45.91 mL/min — AB (ref >60.00)
Glucose, Bld: 141 mg/dL — ABNORMAL HIGH (ref 70–99)
Potassium: 4.1 mEq/L (ref 3.5–5.1)
Sodium: 139 mEq/L (ref 135–145)
TOTAL PROTEIN: 6.5 g/dL (ref 6.0–8.3)
Total Bilirubin: 0.7 mg/dL (ref 0.2–1.2)

## 2018-10-13 LAB — POC INFLUENZA A&B (BINAX/QUICKVUE)
INFLUENZA A, POC: POSITIVE — AB
Influenza B, POC: NEGATIVE

## 2018-10-13 LAB — BRAIN NATRIURETIC PEPTIDE: Pro B Natriuretic peptide (BNP): 26 pg/mL (ref 0.0–100.0)

## 2018-10-13 MED ORDER — FLUTICASONE PROPIONATE 50 MCG/ACT NA SUSP: 2.0000 | Freq: Every day | NASAL | 1 refills | Status: DC

## 2018-10-13 MED ORDER — OSELTAMIVIR PHOSPHATE 75 MG PO CAPS: 75.0000 mg | ORAL_CAPSULE | Freq: Two times a day (BID) | ORAL | 0 refills | Status: DC

## 2018-10-13 MED ORDER — HYDROCODONE-HOMATROPINE 5-1.5 MG/5ML PO SYRP: 5.0000 mL | ORAL_SOLUTION | Freq: Four times a day (QID) | ORAL | 0 refills | Status: DC | PRN

## 2018-10-13 MED ORDER — DOXYCYCLINE HYCLATE 100 MG PO TABS: 100.0000 mg | ORAL_TABLET | Freq: Two times a day (BID) | ORAL | 0 refills | Status: DC

## 2018-10-13 NOTE — Patient Instructions (Signed)
Your flu test did come back positive.  With your medical history I do think it is best to start Tamiflu.  Difficult to say exactly when also little notes occurred.  If greater than 48 hours then medication for flu less effective.  With positive flu and recent symptoms concern for bronchitis versus pneumonia.  I am prescribing doxycycline antibiotic and Hycodan medication/syrup for cough.  Rx advisement given.  Flonase prescription given for nasal congestion.  Please get chest x-ray today.  Also I want you to go ahead and get a CBC, CMP and BNP.  Will assess your electrolytes, infection fighting cells and if your heart failure protein is elevated.   You do have some left hip pain recently over the last a week.  I will get x-ray of your left hip when you getting a chest x-ray.  If your signs symptoms worsen despite the above measures then recommend ED evaluation.  Follow-up in 7 days or as needed.

## 2018-10-13 NOTE — Progress Notes (Signed)
Subjective:    Patient ID: William Hendrix, male    DOB: 06-21-1941, 78 y.o.   MRN: [id number]  HPI  Pt has been sick since Monday night. He started with st then got progressive chest congestion. States 2 days in a row he has been coughing up a lot of mucus.    Also having some sinus pressure and yellow mucus when blows nose.  Some left hip pain over past week. No fall or trauma.   Pt states over past week he gained about 12 pounds. He is on lasix.. But on 09-16-2018 his weight was 254 lb. Today weight was 257 lb.   Review of Systems  Constitutional: Negative for chills, fatigue and unexpected weight change.  HENT: Negative for congestion and drooling.   Respiratory: Negative for cough, chest tightness and wheezing.   Cardiovascular: Negative for chest pain and palpitations.  Gastrointestinal: Negative for abdominal pain.  Musculoskeletal: Negative for back pain and neck pain.  Skin: Negative for rash.  Hematological: Negative for adenopathy. Does not bruise/bleed easily.    Past Medical History:  Diagnosis Date  . AAA (abdominal aortic aneurysm) ([place]) 12/15/2014   a. Mild aneurysmal dilatation of the infrarenal abdominal aorta 3.2 cm - f/u due by 2019.  . Arthritis    "all over" (07/30/2016)  . CAD (coronary artery disease)    a. stent to LAD 2000. b. possible spasm by cath 2001. c. IVUS/PTCA/DES to mLAD 05/2013. d. PTCA/DES of dRCA into ostial rPDA 07/2013. e. PTCA of OM2, DES to Cx, DES to [facility] 07/2016.  Marland Kitchen Chronic bronchitis ([place])   . Chronic diastolic CHF (congestive heart failure) ([place])   . Chronic lower back pain   . Chronic neck pain   . CKD (chronic kidney disease), stage III ([employer])   . Diabetic peripheral neuropathy ([place])   . Ejection fraction    55%, 07/2010, mild inferior hypo  . GERD (gastroesophageal reflux disease)   . Gout   . H/O diverticulitis of colon 01/31/2015  . H/O hiatal hernia   . History of blood transfusion ~ 10/1940   "had pneumonia"  . History  of kidney stones   . HTN (hypertension)   . Hyperlipidemia   . Melanoma of forearm, left ([place]) 02/2011   with wide excision   . Myocardial infarction ([place])   . Nephrolithiasis   . Obesity   . OSA on CPAP     [name] since 2000  . Peripheral neuropathy    both feet  . Positive D-dimer    a. significant elevation ,hospital 07/2010, etiology unclear. b. D Dimer chronically > 20.  Marland Kitchen Renal insufficiency   . Skin cancer   . Spinal stenosis    a. s/p surgical repair 2013  . Spinal stenosis of lumbar region   . Type II diabetes mellitus ([place])   . Ventral hernia      Social History   Socioeconomic History  . Marital status: Married    Spouse name: Not on file  . Number of children: Not on file  . Years of education: Not on file  . Highest education level: Not on file  Occupational History  . Occupation: [employer]  . Financial resource strain: Not on file  . Food insecurity:    Worry: Not on file    Inability: Not on file  . Transportation needs:    Medical: Not on file    Non-medical: Not on file  Tobacco Use  . Smoking  status: Former Smoker    Packs/day: 3.00    Years: 30.00    Pack years: 90.00    Types: Cigarettes    Last attempt to quit: 09/17/1996    Years since quitting: 22.0  . Smokeless tobacco: Never Used  Substance and Sexual Activity  . Alcohol use: No  . Drug use: No  . Sexual activity: Not Currently    Birth control/protection: None  Lifestyle  . Physical activity:    Days per week: Not on file    Minutes per session: Not on file  . Stress: Not on file  Relationships  . Social connections:    Talks on phone: Not on file    Gets together: Not on file    Attends religious service: Not on file    Active member of club or organization: Not on file    Attends meetings of clubs or organizations: Not on file    Relationship status: Not on file  . Intimate partner violence:    Fear of current or ex partner: Not on file    Emotionally  abused: Not on file    Physically abused: Not on file    Forced sexual activity: Not on file  Other Topics Concern  . Not on file  Social History Narrative   Married and lives locally with his wife.  Sharyon Cable.    Past Surgical History:  Procedure Laterality Date  . ANTERIOR LAT LUMBAR FUSION  10/22/2017   Procedure: Lumbar two-three Lumbar three-four Lumbar four-five Anteriolateral lumbar interbody fusion with percutaneous pedicle screw fixation and infuse;  Surgeon: [name], MD;  Location: [place];  Service: Neurosurgery;;  . APPLICATION OF ROBOTIC ASSISTANCE FOR SPINAL PROCEDURE  10/22/2017   Procedure: APPLICATION OF ROBOTIC ASSISTANCE FOR SPINAL PROCEDURE;  Surgeon: [name], MD;  Location: [place];  Service: Neurosurgery;;  . BACK SURGERY    . CARDIAC CATHETERIZATION N/A 07/30/2016   Procedure: Left Heart Cath and Coronary Angiography;  Surgeon: Nelva Bush, MD;  Location: [place] CV LAB;  Service: Cardiovascular;  Laterality: N/A;  . CARDIAC CATHETERIZATION N/A 07/30/2016   Procedure: Coronary Stent Intervention;  Surgeon: Nelva Bush, MD;  Location: [place] CV LAB;  Service: Cardiovascular;  Laterality: N/A;  Mid CFX and MID RCA  . CARDIAC CATHETERIZATION N/A 07/30/2016   Procedure: Coronary Balloon Angioplasty;  Surgeon: Nelva Bush, MD;  Location: [place] CV LAB;  Service: Cardiovascular;  Laterality: N/A;  OM 1  . CARPAL TUNNEL RELEASE Left   . CATARACT EXTRACTION W/ INTRAOCULAR LENS  IMPLANT, BILATERAL Bilateral ~ 2014  . CERVICAL DISC SURGERY  1990s  . CORONARY ANGIOPLASTY WITH STENT PLACEMENT  05/2013  . CORONARY ANGIOPLASTY WITH STENT PLACEMENT  07/26/2013   DES to RCA extending to PDA    . CORONARY ANGIOPLASTY WITH STENT PLACEMENT  07/30/2016  . CORONARY ANGIOPLASTY WITH STENT PLACEMENT  2000   CAD  . DENTAL SURGERY  04/2016   "got infected; had to dig it out"  . EYE SURGERY    . KNEE ARTHROSCOPY Right 1990's   rt  . LEFT AND RIGHT HEART  CATHETERIZATION WITH CORONARY ANGIOGRAM N/A 07/26/2013   Procedure: LEFT AND RIGHT HEART CATHETERIZATION WITH CORONARY ANGIOGRAM;  Surgeon: [name], MD;  Location: [place] CATH LAB;  Service: Cardiovascular;  Laterality: N/A;  . LEFT HEART CATH AND CORONARY ANGIOGRAPHY N/A 02/26/2017   Procedure: Left Heart Cath and Coronary Angiography;  Surgeon: Sherren Mocha, MD;  Location: [place] CV LAB;  Service:  Cardiovascular;  Laterality: N/A;  . LEFT HEART CATHETERIZATION WITH CORONARY ANGIOGRAM N/A 05/19/2013   Procedure: LEFT HEART CATHETERIZATION WITH CORONARY ANGIOGRAM;  Surgeon: [name], MD;  Location: [facility] CATH LAB;  Service: Cardiovascular;  Laterality: N/A;  . LUMBAR LAMINECTOMY/DECOMPRESSION MICRODISCECTOMY  08/30/2012   Procedure: LUMBAR LAMINECTOMY/DECOMPRESSION MICRODISCECTOMY 2 LEVELS;  Surgeon: [name], MD;  Location: [place] [facility];  Service: Neurosurgery;  Laterality: Bilateral;  Bilateral Lumbar three-four Lumbar four-five Laminotomies  . LUMBAR PERCUTANEOUS PEDICLE SCREW 1 LEVEL Bilateral 10/22/2017   Procedure: LUMBAR PERCUTANEOUS PEDICLE SCREW 1 LEVEL;  Surgeon: [name], MD;  Location: [place];  Service: Neurosurgery;  Laterality: Bilateral;  . LUMBAR SPINE SURGERY  04/07/2016   Dr. Ellene Route; "?ruptured disc"  . MELANOMA EXCISION Left 02/2011   forearm  . NASAL SINUS SURGERY  1970s   "cut windows in sinus pockets"  . PERCUTANEOUS CORONARY STENT INTERVENTION (PCI-S)  05/19/2013   Procedure: PERCUTANEOUS CORONARY STENT INTERVENTION (PCI-S);  Surgeon: [name], MD;  Location: [facility] CATH LAB;  Service: Cardiovascular;;  . ULNAR TUNNEL RELEASE Left 04/07/2016   Dr. Ellene Route    Family History  Problem Relation Age of Onset  . Cancer Mother        intestinal   . Heart disease Mother   . Cancer Father        prostate  . Migraines Daughter   . Leukemia Sister   . Heart disease Sister   . Prostate cancer Unknown   . Kidney cancer Unknown   . Cancer Unknown         Bladder cancer  . Coronary artery disease Unknown   . Hypertension Sister   . Hypertension Brother   . Heart attack Neg Hx   . Stroke Neg Hx     Allergies  Allergen Reactions  . Brilinta [Ticagrelor] Shortness Of Breath  . Levemir [Insulin Detemir] Swelling and Other (See Comments)    Patient had redness and swelling and tenderness at injection site.  . Codeine Other (See Comments)    Makes him "shakey", is OK with hydrocodone GI UPSET & TREMORS  . Lipitor [Atorvastatin] Other (See Comments)    Muscle aches; affects liver function- Patient is currently taking  . Novolog Mix [Insulin Aspart Prot & Aspart] Other (See Comments)    Causes skin to swell/itch at injection site    Current Outpatient Medications on File Prior to Visit  Medication Sig Dispense Refill  . acetaminophen (TYLENOL) 500 MG tablet Take 500 mg by mouth 2 (two) times daily.     Marland Kitchen albuterol (PROVENTIL HFA;VENTOLIN HFA) 108 (90 Base) MCG/ACT inhaler Inhale 2 puffs into the lungs every 6 (six) hours as needed for wheezing or shortness of breath. 1 Inhaler 0  . allopurinol (ZYLOPRIM) 300 MG tablet Take 1 tablet (300 mg total) by mouth daily. 90 tablet 1  . aspirin EC 81 MG tablet Take 81 mg by mouth daily.    Marland Kitchen atorvastatin (LIPITOR) 40 MG tablet Take 1 tablet (40 mg total) by mouth daily. 90 tablet 1  . B Complex Vitamins (B COMPLEX-B12 PO) Take 1 tablet by mouth every 7 (seven) days.     . BD INSULIN SYRINGE ULTRAFINE 31G X 5/16" 1 ML MISC USE  TO INJECT  INSULIN  FIVE  TIMES  DAILY AS INSTRUCTED 450 each 3  . chlorpheniramine (CHLOR-TRIMETON) 4 MG tablet Take 4 mg by mouth daily.    . Cholecalciferol (VITAMIN D3) 25 MCG (1000 UT) CAPS Take 1,000 Units by  mouth at bedtime.    . fenofibrate 160 MG tablet Take 1 tablet (160 mg total) by mouth at bedtime. 90 tablet 1  . fluocinonide cream (LIDEX) 2.33 % Apply 1 application topically daily as needed (for rashes).     . fluticasone (FLONASE) 50 MCG/ACT nasal spray Place 2  sprays into both nostrils daily. (Patient taking differently: Place 2 sprays into both nostrils daily as needed for allergies or rhinitis. ) 16 g 1  . furosemide (LASIX) 40 MG tablet TAKE 1 AND 1/2 TABLETS EVERY DAY 135 tablet 3  . glucose blood (TRUE METRIX BLOOD GLUCOSE TEST) test strip USE TO TEST BLOOD SUGAR THREE TIMES DAILY AS DIRECTED 300 each 3  . HYDROcodone-acetaminophen (NORCO) 10-325 MG tablet Take 0.5 tablets by mouth 2 (two) times daily.  0  . insulin NPH Human (NOVOLIN N) 100 UNIT/ML injection Inject 45 units in am and 40 in the evening (Patient taking differently: Inject 45-50 Units into the skin See admin instructions. Inject 45 units into the skin before breakfast and 50 units before supper/evening meal) 30 mL 3  . insulin regular (NOVOLIN R RELION) 100 units/mL injection Inject 0.25-0.3 mLs (25-30 Units total) into the skin 3 (three) times daily before meals. (Patient taking differently: Inject 35 Units into the skin 3 (three) times daily before meals. ) 70 mL 3  . isosorbide mononitrate (IMDUR) 30 MG 24 hr tablet Take 1 tablet (30 mg total) by mouth daily. 90 tablet 3  . MAGNESIUM PO Take 1 tablet by mouth at bedtime.    . metoprolol tartrate (LOPRESSOR) 50 MG tablet Take 1 tablet (50 mg total) by mouth 2 (two) times daily. 180 tablet 1  . nitroGLYCERIN (NITROSTAT) 0.4 MG SL tablet Place 1 tablet (0.4 mg total) under the tongue every 5 (five) minutes as needed for chest pain. 100 tablet 3  . pantoprazole (PROTONIX) 20 MG tablet Take 1 tablet (20 mg total) by mouth daily. (Patient taking differently: Take 20 mg by mouth at bedtime. ) 90 tablet 3  . Probiotic Product (PROBIOTIC PO) Take 1 capsule by mouth at bedtime.     . tamsulosin (FLOMAX) 0.4 MG CAPS capsule Take 1 capsule (0.4 mg total) by mouth daily. 90 capsule 1  . tiZANidine (ZANAFLEX) 4 MG tablet Take 4 mg by mouth 2 (two) times daily.  6   No current facility-administered medications on file prior to visit.     BP  105/68   Pulse (!) 59   Temp 98.2 F (36.8 C) (Oral)   Resp 16   Ht 5\' 10"  (1.778 m)   Wt 257 lb 6.4 oz (116.8 kg)   SpO2 98%   BMI 36.93 kg/m       Objective:   Physical Exam  General Mental Status- Alert. General Appearance- Not in acute distress.   Skin General: Color- Normal Color. Moisture- Normal Moisture.  Neck Carotid Arteries- Normal color. Moisture- Normal Moisture. No carotid bruits. No JVD.  Sinus- maxillary sinus pressure on palpation. Boggy turbinates.  Chest and Lung Exam Auscultation: Breath Sounds:-Normal.  Cardiovascular Auscultation:Rythm- Regular. Murmurs & Other Heart Sounds:Auscultation of the heart reveals- No Murmurs.  Abdomen Inspection:-Inspeection Normal. Palpation/Percussion:Note:No mass. Palpation and Percussion of the abdomen reveal- Non Tender, Non Distended + BS, no rebound or guarding.    Neurologic Cranial Nerve exam:- CN III-XII intact(No nystagmus), symmetric smile. Strength:- 5/5 equal and symmetric strength both upper and lower extremities.  Lower ext- no pedal edema. Negative homans signs.  Assessment & Plan:  Your flu test did come back positive.  With your medical history I do think it is best to start Tamiflu.  Difficult to say exactly when also little notes occurred.  If greater than 48 hours then medication for flu less effective.  With positive flu and recent symptoms concern for bronchitis versus pneumonia.  I am prescribing doxycycline antibiotic and Hycodan medication/syrup for cough.  Rx advisement given.  Flonase prescription given for nasal congestion.  Please get chest x-ray today.  Also I want you to go ahead and get a CBC, CMP and BNP.  Will assess your electrolytes, infection fighting cells and if your heart failure protein is elevated.   You do have some left hip pain recently over the last a week.  I will get x-ray of your left hip when you getting a chest x-ray.  If your signs symptoms worsen  despite the above measures then recommend ED evaluation.  Follow-up in 7 days or as needed.  Mackie Pai, PA-C

## 2018-10-18 ENCOUNTER — Ambulatory Visit: Payer: Medicare HMO | Admitting: Family Medicine

## 2018-10-24 ENCOUNTER — Institutional Professional Consult (permissible substitution): Payer: Medicare HMO | Admitting: Pulmonary Disease

## 2018-10-24 NOTE — Progress Notes (Signed)
HPI: FU CAD. Pt has had previous PCI of his LAD. Patient had admission October 2017 with NSTEMI. He had PCI of the circumflex and RCA with drug-eluting stents. Cardiac catheterization repeated May 2018because of dyspnea and chest pain. There was continued patency of stent segments in his RCA, left circumflex and LAD. Left ventricular end-diastolic pressure moderately elevated(23). Abdominal CT October 2019 showed 3 cm abdominal aortic aneurysm.  Admitted with CHF and chest pain December 2019.  Enzymes negative.  Echocardiogram December 2019 showed normal LV function and mild left atrial enlargement.  Nuclear study December 2019 showed no ischemia or infarction.  Since last seen,he denies dyspnea, chest pain, palpitations or syncope.  No pedal edema.  He is recovering from recent flu/pneumonia.  Current Outpatient Medications  Medication Sig Dispense Refill  . acetaminophen (TYLENOL) 500 MG tablet Take 500 mg by mouth 2 (two) times daily.     Marland Kitchen albuterol (PROVENTIL HFA;VENTOLIN HFA) 108 (90 Base) MCG/ACT inhaler Inhale 2 puffs into the lungs every 6 (six) hours as needed for wheezing or shortness of breath. 1 Inhaler 0  . allopurinol (ZYLOPRIM) 300 MG tablet Take 1 tablet (300 mg total) by mouth daily. 90 tablet 1  . aspirin EC 81 MG tablet Take 81 mg by mouth daily.    Marland Kitchen atorvastatin (LIPITOR) 40 MG tablet Take 1 tablet (40 mg total) by mouth daily. 90 tablet 1  . B Complex Vitamins (B COMPLEX-B12 PO) Take 1 tablet by mouth every 7 (seven) days.     . BD INSULIN SYRINGE ULTRAFINE 31G X 5/16" 1 ML MISC USE  TO INJECT  INSULIN  FIVE  TIMES  DAILY AS INSTRUCTED 450 each 3  . chlorpheniramine (CHLOR-TRIMETON) 4 MG tablet Take 4 mg by mouth daily.    . Cholecalciferol (VITAMIN D3) 25 MCG (1000 UT) CAPS Take 1,000 Units by mouth at bedtime.    Marland Kitchen doxycycline (VIBRA-TABS) 100 MG tablet Take 1 tablet (100 mg total) by mouth 2 (two) times daily. Can give caps or generic 20 tablet 0  . fenofibrate  160 MG tablet Take 1 tablet (160 mg total) by mouth at bedtime. 90 tablet 1  . fluocinonide cream (LIDEX) 3.53 % Apply 1 application topically daily as needed (for rashes).     . fluticasone (FLONASE) 50 MCG/ACT nasal spray Place 2 sprays into both nostrils daily. (Patient taking differently: Place 2 sprays into both nostrils daily as needed for allergies or rhinitis. ) 16 g 1  . fluticasone (FLONASE) 50 MCG/ACT nasal spray Place 2 sprays into both nostrils daily. 16 g 1  . furosemide (LASIX) 40 MG tablet TAKE 1 AND 1/2 TABLETS EVERY DAY 135 tablet 3  . glucose blood (TRUE METRIX BLOOD GLUCOSE TEST) test strip USE TO TEST BLOOD SUGAR THREE TIMES DAILY AS DIRECTED 300 each 3  . HYDROcodone-acetaminophen (NORCO) 10-325 MG tablet Take 0.5 tablets by mouth 2 (two) times daily.  0  . HYDROcodone-homatropine (HYCODAN) 5-1.5 MG/5ML syrup Take 5 mLs by mouth every 6 (six) hours as needed for cough. 100 mL 0  . insulin NPH Human (NOVOLIN N) 100 UNIT/ML injection Inject 45 units in am and 40 in the evening (Patient taking differently: Inject 45-50 Units into the skin See admin instructions. Inject 45 units into the skin before breakfast and 50 units before supper/evening meal) 30 mL 3  . insulin regular (NOVOLIN R RELION) 100 units/mL injection Inject 0.25-0.3 mLs (25-30 Units total) into the skin 3 (three) times daily before  meals. (Patient taking differently: Inject 35 Units into the skin 3 (three) times daily before meals. ) 70 mL 3  . isosorbide mononitrate (IMDUR) 30 MG 24 hr tablet Take 1 tablet (30 mg total) by mouth daily. 90 tablet 3  . MAGNESIUM PO Take 1 tablet by mouth at bedtime.    . metoprolol tartrate (LOPRESSOR) 50 MG tablet Take 1 tablet (50 mg total) by mouth 2 (two) times daily. 180 tablet 1  . nitroGLYCERIN (NITROSTAT) 0.4 MG SL tablet Place 1 tablet (0.4 mg total) under the tongue every 5 (five) minutes as needed for chest pain. 100 tablet 3  . oseltamivir (TAMIFLU) 75 MG capsule Take 1  capsule (75 mg total) by mouth 2 (two) times daily. 10 capsule 0  . pantoprazole (PROTONIX) 20 MG tablet Take 1 tablet (20 mg total) by mouth daily. (Patient taking differently: Take 20 mg by mouth at bedtime. ) 90 tablet 3  . Probiotic Product (PROBIOTIC PO) Take 1 capsule by mouth at bedtime.     . tamsulosin (FLOMAX) 0.4 MG CAPS capsule Take 1 capsule (0.4 mg total) by mouth daily. 90 capsule 1  . tiZANidine (ZANAFLEX) 4 MG tablet Take 4 mg by mouth 2 (two) times daily.  6   No current facility-administered medications for this visit.      Past Medical History:  Diagnosis Date  . AAA (abdominal aortic aneurysm) ([place]) 12/15/2014   a. Mild aneurysmal dilatation of the infrarenal abdominal aorta 3.2 cm - f/u due by 2019.  . Arthritis    "all over" (07/30/2016)  . CAD (coronary artery disease)    a. stent to LAD 2000. b. possible spasm by cath 2001. c. IVUS/PTCA/DES to mLAD 05/2013. d. PTCA/DES of dRCA into ostial rPDA 07/2013. e. PTCA of OM2, DES to Cx, DES to [facility] 07/2016.  Marland Kitchen Chronic bronchitis ([place])   . Chronic diastolic CHF (congestive heart failure) ([place])   . Chronic lower back pain   . Chronic neck pain   . CKD (chronic kidney disease), stage III ([place])   . Diabetic peripheral neuropathy ([place])   . Ejection fraction    55%, 07/2010, mild inferior hypo  . GERD (gastroesophageal reflux disease)   . Gout   . H/O diverticulitis of colon 01/31/2015  . H/O hiatal hernia   . History of blood transfusion ~ 10/1940   "had pneumonia"  . History of kidney stones   . HTN (hypertension)   . Hyperlipidemia   . Melanoma of forearm, left ([place]) 02/2011   with wide excision   . Myocardial infarction ([place])   . Nephrolithiasis   . Obesity   . OSA on CPAP     [name] since 2000  . Peripheral neuropathy    both feet  . Positive D-dimer    a. significant elevation ,hospital 07/2010, etiology unclear. b. D Dimer chronically > 20.  Marland Kitchen Renal insufficiency   . Skin cancer   . Spinal stenosis     a. s/p surgical repair 2013  . Spinal stenosis of lumbar region   . Type II diabetes mellitus ([place])   . Ventral hernia     Past Surgical History:  Procedure Laterality Date  . ANTERIOR LAT LUMBAR FUSION  10/22/2017   Procedure: Lumbar two-three Lumbar three-four Lumbar four-five Anteriolateral lumbar interbody fusion with percutaneous pedicle screw fixation and infuse;  Surgeon: [name], MD;  Location: [facility];  Service: Neurosurgery;;  . APPLICATION OF ROBOTIC ASSISTANCE FOR SPINAL PROCEDURE  10/22/2017  Procedure: APPLICATION OF ROBOTIC ASSISTANCE FOR SPINAL PROCEDURE;  Surgeon: [name], MD;  Location: [facility];  Service: Neurosurgery;;  . BACK SURGERY    . CARDIAC CATHETERIZATION N/A 07/30/2016   Procedure: Left Heart Cath and Coronary Angiography;  Surgeon: Nelva Bush, MD;  Location: [facility] CV LAB;  Service: Cardiovascular;  Laterality: N/A;  . CARDIAC CATHETERIZATION N/A 07/30/2016   Procedure: Coronary Stent Intervention;  Surgeon: Nelva Bush, MD;  Location: [facility] CV LAB;  Service: Cardiovascular;  Laterality: N/A;  Mid CFX and MID RCA  . CARDIAC CATHETERIZATION N/A 07/30/2016   Procedure: Coronary Balloon Angioplasty;  Surgeon: Nelva Bush, MD;  Location: [facility] CV LAB;  Service: Cardiovascular;  Laterality: N/A;  OM 1  . CARPAL TUNNEL RELEASE Left   . CATARACT EXTRACTION W/ INTRAOCULAR LENS  IMPLANT, BILATERAL Bilateral ~ 2014  . CERVICAL DISC SURGERY  1990s  . CORONARY ANGIOPLASTY WITH STENT PLACEMENT  05/2013  . CORONARY ANGIOPLASTY WITH STENT PLACEMENT  07/26/2013   DES to RCA extending to PDA    . CORONARY ANGIOPLASTY WITH STENT PLACEMENT  07/30/2016  . CORONARY ANGIOPLASTY WITH STENT PLACEMENT  2000   CAD  . DENTAL SURGERY  04/2016   "got infected; had to dig it out"  . EYE SURGERY    . KNEE ARTHROSCOPY Right 1990's   rt  . LEFT AND RIGHT HEART CATHETERIZATION WITH CORONARY ANGIOGRAM N/A 07/26/2013   Procedure: LEFT AND RIGHT HEART  CATHETERIZATION WITH CORONARY ANGIOGRAM;  Surgeon: [name], MD;  Location: [facility] CATH LAB;  Service: Cardiovascular;  Laterality: N/A;  . LEFT HEART CATH AND CORONARY ANGIOGRAPHY N/A 02/26/2017   Procedure: Left Heart Cath and Coronary Angiography;  Surgeon: Sherren Mocha, MD;  Location: [facility] CV LAB;  Service: Cardiovascular;  Laterality: N/A;  . LEFT HEART CATHETERIZATION WITH CORONARY ANGIOGRAM N/A 05/19/2013   Procedure: LEFT HEART CATHETERIZATION WITH CORONARY ANGIOGRAM;  Surgeon: [name], MD;  Location: [facility] CATH LAB;  Service: Cardiovascular;  Laterality: N/A;  . LUMBAR LAMINECTOMY/DECOMPRESSION MICRODISCECTOMY  08/30/2012   Procedure: LUMBAR LAMINECTOMY/DECOMPRESSION MICRODISCECTOMY 2 LEVELS;  Surgeon: [name], MD;  Location: [facility];  Service: Neurosurgery;  Laterality: Bilateral;  Bilateral Lumbar three-four Lumbar four-five Laminotomies  . LUMBAR PERCUTANEOUS PEDICLE SCREW 1 LEVEL Bilateral 10/22/2017   Procedure: LUMBAR PERCUTANEOUS PEDICLE SCREW 1 LEVEL;  Surgeon: [name], MD;  Location: [facility];  Service: Neurosurgery;  Laterality: Bilateral;  . LUMBAR SPINE SURGERY  04/07/2016   Dr. Ellene Route; "?ruptured disc"  . MELANOMA EXCISION Left 02/2011   forearm  . NASAL SINUS SURGERY  1970s   "cut windows in sinus pockets"  . PERCUTANEOUS CORONARY STENT INTERVENTION (PCI-S)  05/19/2013   Procedure: PERCUTANEOUS CORONARY STENT INTERVENTION (PCI-S);  Surgeon: [name], MD;  Location: [facility] CATH LAB;  Service: Cardiovascular;;  . ULNAR TUNNEL RELEASE Left 04/07/2016   Dr. Ellene Route    Social History   Socioeconomic History  . Marital status: Married    Spouse name: Not on file  . Number of children: Not on file  . Years of education: Not on file  . Highest education level: Not on file  Occupational History  . Occupation: [employer]  . Financial resource strain: Not on file  . Food insecurity:    Worry: Not on file    Inability: Not on file    . Transportation needs:    Medical: Not on file    Non-medical: Not on file  Tobacco Use  .  Smoking status: Former Smoker    Packs/day: 3.00    Years: 30.00    Pack years: 90.00    Types: Cigarettes    Last attempt to quit: 09/17/1996    Years since quitting: 22.1  . Smokeless tobacco: Never Used  Substance and Sexual Activity  . Alcohol use: No  . Drug use: No  . Sexual activity: Not Currently    Birth control/protection: None  Lifestyle  . Physical activity:    Days per week: Not on file    Minutes per session: Not on file  . Stress: Not on file  Relationships  . Social connections:    Talks on phone: Not on file    Gets together: Not on file    Attends religious service: Not on file    Active member of club or organization: Not on file    Attends meetings of clubs or organizations: Not on file    Relationship status: Not on file  . Intimate partner violence:    Fear of current or ex partner: Not on file    Emotionally abused: Not on file    Physically abused: Not on file    Forced sexual activity: Not on file  Other Topics Concern  . Not on file  Social History Narrative   Married and lives locally with his wife.  Sharyon Cable.    Family History  Problem Relation Age of Onset  . Cancer Mother        intestinal   . Heart disease Mother   . Cancer Father        prostate  . Migraines Daughter   . Leukemia Sister   . Heart disease Sister   . Prostate cancer Other   . Kidney cancer Other   . Cancer Other        Bladder cancer  . Coronary artery disease Other   . Hypertension Sister   . Hypertension Brother   . Heart attack Neg Hx   . Stroke Neg Hx     ROS: Hip pain but no fevers or chills, productive cough, hemoptysis, dysphasia, odynophagia, melena, hematochezia, dysuria, hematuria, rash, seizure activity, orthopnea, PND, pedal edema, claudication. Remaining systems are negative.  Physical Exam: Well-developed well-nourished in no acute distress.  Skin is  warm and dry.  HEENT is normal.  Neck is supple.  Chest is clear to auscultation with normal expansion.  Cardiovascular exam is regular rate and rhythm.  Abdominal exam nontender or distended. No masses palpated. Extremities show no edema. neuro grossly intact  ECG-sinus bradycardia at a rate of 53.  Left anterior fascicular block.  Nonspecific inferior lateral T wave inversion.  Personally reviewed  A/P  1 coronary artery disease-patient denies chest pain and recent nuclear study showed no ischemia.  Continue medical therapy with aspirin and statin.  2 abdominal aortic aneurysm-plan follow-up ultrasound in October 2020.  3 hypertension-patient's blood pressure is controlled.  Continue present medications and follow.  4 hyperlipidemia-continue statin.  Kirk Ruths, MD

## 2018-10-26 ENCOUNTER — Ambulatory Visit: Payer: Medicare HMO | Admitting: Cardiology

## 2018-10-26 ENCOUNTER — Encounter: Payer: Self-pay | Admitting: Cardiology

## 2018-10-26 VITALS — BP 112/58 | HR 53 | Ht 70.0 in | Wt 264.8 lb

## 2018-10-26 DIAGNOSIS — I10 Essential (primary) hypertension: Secondary | ICD-10-CM

## 2018-10-26 DIAGNOSIS — I714 Abdominal aortic aneurysm, without rupture, unspecified: Secondary | ICD-10-CM

## 2018-10-26 DIAGNOSIS — E7800 Pure hypercholesterolemia, unspecified: Secondary | ICD-10-CM | POA: Diagnosis not present

## 2018-10-26 DIAGNOSIS — I2510 Atherosclerotic heart disease of native coronary artery without angina pectoris: Secondary | ICD-10-CM | POA: Diagnosis not present

## 2018-10-26 NOTE — Patient Instructions (Signed)
Medication Instructions:   NO CHANGE  Follow-Up:  Your physician recommends that you schedule a follow-up appointment in: [place] OUR OFFICE A CALL 2 MONTHS PRIOR TO THAT APPOINTMENT TIME TO SCHEDULE   CALL IN MAY FOR AN APPOINTMENT IN [place]

## 2018-10-28 ENCOUNTER — Ambulatory Visit (INDEPENDENT_AMBULATORY_CARE_PROVIDER_SITE_OTHER): Payer: Medicare HMO | Admitting: Family Medicine

## 2018-10-28 VITALS — BP 112/58 | HR 63 | Temp 97.3°F | Resp 18 | Wt 256.4 lb

## 2018-10-28 DIAGNOSIS — E785 Hyperlipidemia, unspecified: Secondary | ICD-10-CM

## 2018-10-28 DIAGNOSIS — E1165 Type 2 diabetes mellitus with hyperglycemia: Secondary | ICD-10-CM | POA: Diagnosis not present

## 2018-10-28 DIAGNOSIS — N3281 Overactive bladder: Secondary | ICD-10-CM

## 2018-10-28 DIAGNOSIS — M5416 Radiculopathy, lumbar region: Secondary | ICD-10-CM | POA: Diagnosis not present

## 2018-10-28 DIAGNOSIS — E1159 Type 2 diabetes mellitus with other circulatory complications: Secondary | ICD-10-CM

## 2018-10-28 DIAGNOSIS — Z6837 Body mass index (BMI) 37.0-37.9, adult: Secondary | ICD-10-CM | POA: Diagnosis not present

## 2018-10-28 DIAGNOSIS — IMO0002 Reserved for concepts with insufficient information to code with codable children: Secondary | ICD-10-CM

## 2018-10-28 DIAGNOSIS — Z794 Long term (current) use of insulin: Secondary | ICD-10-CM | POA: Diagnosis not present

## 2018-10-28 DIAGNOSIS — I10 Essential (primary) hypertension: Secondary | ICD-10-CM | POA: Diagnosis not present

## 2018-10-28 MED ORDER — METOPROLOL TARTRATE 50 MG PO TABS: 50.0000 mg | ORAL_TABLET | Freq: Two times a day (BID) | ORAL | 1 refills | Status: DC

## 2018-10-28 MED ORDER — HYDROCODONE-HOMATROPINE 5-1.5 MG/5ML PO SYRP: 5.0000 mL | ORAL_SOLUTION | Freq: Four times a day (QID) | ORAL | 0 refills | Status: DC | PRN

## 2018-10-28 MED ORDER — TAMSULOSIN HCL 0.4 MG PO CAPS: 0.4000 mg | ORAL_CAPSULE | Freq: Every day | ORAL | 1 refills | Status: DC

## 2018-10-28 MED ORDER — OXYBUTYNIN CHLORIDE 5 MG PO TABS: 5.0000 mg | ORAL_TABLET | Freq: Two times a day (BID) | ORAL | 1 refills | Status: DC

## 2018-10-28 NOTE — Assessment & Plan Note (Signed)
Well controlled, no changes to meds. Encouraged heart healthy diet such as the DASH diet and exercise as tolerated.  °

## 2018-10-28 NOTE — Assessment & Plan Note (Signed)
Encouraged heart healthy diet, increase exercise, avoid trans fats, consider a krill oil cap daily 

## 2018-10-28 NOTE — Patient Instructions (Signed)

## 2018-10-29 LAB — CBC WITH DIFFERENTIAL/PLATELET
Absolute Monocytes: 319 cells/uL (ref 200–950)
BASOS PCT: 0.5 %
Basophils Absolute: 28 cells/uL (ref 0–200)
Eosinophils Absolute: 140 cells/uL (ref 15–500)
Eosinophils Relative: 2.5 %
HCT: 37.7 % — ABNORMAL LOW (ref 38.5–50.0)
Hemoglobin: 12.6 g/dL — ABNORMAL LOW (ref 13.2–17.1)
Lymphs Abs: 1814 cells/uL (ref 850–3900)
MCH: 31 pg (ref 27.0–33.0)
MCHC: 33.4 g/dL (ref 32.0–36.0)
MCV: 92.6 fL (ref 80.0–100.0)
MPV: 10.6 fL (ref 7.5–12.5)
Monocytes Relative: 5.7 %
Neutro Abs: 3298 cells/uL (ref 1500–7800)
Neutrophils Relative %: 58.9 %
Platelets: 202 10*3/uL (ref 140–400)
RBC: 4.07 10*6/uL — ABNORMAL LOW (ref 4.20–5.80)
RDW: 13.4 % (ref 11.0–15.0)
Total Lymphocyte: 32.4 %
WBC: 5.6 10*3/uL (ref 3.8–10.8)

## 2018-10-29 LAB — COMPREHENSIVE METABOLIC PANEL
AG Ratio: 1.5 (calc) (ref 1.0–2.5)
ALT: 13 U/L (ref 9–46)
AST: 19 U/L (ref 10–35)
Albumin: 3.9 g/dL (ref 3.6–5.1)
Alkaline phosphatase (APISO): 50 U/L (ref 40–115)
BILIRUBIN TOTAL: 0.7 mg/dL (ref 0.2–1.2)
BUN/Creatinine Ratio: 17 (calc) (ref 6–22)
BUN: 25 mg/dL (ref 7–25)
CALCIUM: 10 mg/dL (ref 8.6–10.3)
CHLORIDE: 101 mmol/L (ref 98–110)
CO2: 28 mmol/L (ref 20–32)
Creat: 1.51 mg/dL — ABNORMAL HIGH (ref 0.70–1.18)
Globulin: 2.6 g/dL (calc) (ref 1.9–3.7)
Glucose, Bld: 253 mg/dL — ABNORMAL HIGH (ref 65–99)
Potassium: 4.5 mmol/L (ref 3.5–5.3)
Sodium: 141 mmol/L (ref 135–146)
Total Protein: 6.5 g/dL (ref 6.1–8.1)

## 2018-10-29 LAB — URINALYSIS
Bilirubin Urine: NEGATIVE
GLUCOSE, UA: NEGATIVE
Hgb urine dipstick: NEGATIVE
Ketones, ur: NEGATIVE
Leukocytes, UA: NEGATIVE
Nitrite: NEGATIVE
Protein, ur: NEGATIVE
Specific Gravity, Urine: 1.011 (ref 1.001–1.03)
pH: <=5 (ref 5.0–8.0)

## 2018-10-29 LAB — URINE CULTURE
MICRO NUMBER:: 71078
SPECIMEN QUALITY: ADEQUATE

## 2018-10-31 DIAGNOSIS — N3281 Overactive bladder: Secondary | ICD-10-CM | POA: Insufficient documentation

## 2018-10-31 NOTE — Progress Notes (Signed)
Subjective:    Patient ID: William Hendrix, male    DOB: 19-Mar-1941, 78 y.o.   MRN: [id number]  No chief complaint on file.   HPI Patient is in today for follow up. He is doing well today. No recent febrile illness or hospitalizations. He continues to struggle with low back and radicular pain but it is improving and is tolerable. Notes trouble with urinary frequency, urgency, incontinence. No hematuria or dysuria. Denies CP/palp/SOB/HA/congestion/fevers/GI c/o. Taking meds as prescribed  Past Medical History:  Diagnosis Date  . AAA (abdominal aortic aneurysm) ([place]) 12/15/2014   a. Mild aneurysmal dilatation of the infrarenal abdominal aorta 3.2 cm - f/u due by 2019.  . Arthritis    "all over" (07/30/2016)  . CAD (coronary artery disease)    a. stent to LAD 2000. b. possible spasm by cath 2001. c. IVUS/PTCA/DES to mLAD 05/2013. d. PTCA/DES of dRCA into ostial rPDA 07/2013. e. PTCA of OM2, DES to Cx, DES to [facility] 07/2016.  Marland Kitchen Chronic bronchitis ([place])   . Chronic diastolic CHF (congestive heart failure) ([place])   . Chronic lower back pain   . Chronic neck pain   . CKD (chronic kidney disease), stage III ([place])   . Diabetic peripheral neuropathy ([place])   . Ejection fraction    55%, 07/2010, mild inferior hypo  . GERD (gastroesophageal reflux disease)   . Gout   . H/O diverticulitis of colon 01/31/2015  . H/O hiatal hernia   . History of blood transfusion ~ 10/1940   "had pneumonia"  . History of kidney stones   . HTN (hypertension)   . Hyperlipidemia   . Melanoma of forearm, left ([place]) 02/2011   with wide excision   . Myocardial infarction ([place])   . Nephrolithiasis   . Obesity   . OSA on CPAP     [name] since 2000  . Peripheral neuropathy    both feet  . Positive D-dimer    a. significant elevation ,hospital 07/2010, etiology unclear. b. D Dimer chronically > 20.  Marland Kitchen Renal insufficiency   . Skin cancer   . Spinal stenosis    a. s/p surgical repair 2013  . Spinal stenosis of  lumbar region   . Type II diabetes mellitus ([place])   . Ventral hernia     Past Surgical History:  Procedure Laterality Date  . ANTERIOR LAT LUMBAR FUSION  10/22/2017   Procedure: Lumbar two-three Lumbar three-four Lumbar four-five Anteriolateral lumbar interbody fusion with percutaneous pedicle screw fixation and infuse;  Surgeon: [name], MD;  Location: [place];  Service: Neurosurgery;;  . APPLICATION OF ROBOTIC ASSISTANCE FOR SPINAL PROCEDURE  10/22/2017   Procedure: APPLICATION OF ROBOTIC ASSISTANCE FOR SPINAL PROCEDURE;  Surgeon: [name], MD;  Location: [place];  Service: Neurosurgery;;  . BACK SURGERY    . CARDIAC CATHETERIZATION N/A 07/30/2016   Procedure: Left Heart Cath and Coronary Angiography;  Surgeon: Nelva Bush, MD;  Location: [place] CV LAB;  Service: Cardiovascular;  Laterality: N/A;  . CARDIAC CATHETERIZATION N/A 07/30/2016   Procedure: Coronary Stent Intervention;  Surgeon: Nelva Bush, MD;  Location: [place] CV LAB;  Service: Cardiovascular;  Laterality: N/A;  Mid CFX and MID RCA  . CARDIAC CATHETERIZATION N/A 07/30/2016   Procedure: Coronary Balloon Angioplasty;  Surgeon: Nelva Bush, MD;  Location: [place] CV LAB;  Service: Cardiovascular;  Laterality: N/A;  OM 1  . CARPAL TUNNEL RELEASE Left   . CATARACT EXTRACTION W/ INTRAOCULAR LENS  IMPLANT, BILATERAL Bilateral ~  2014  . [place] SURGERY  1990s  . CORONARY ANGIOPLASTY WITH STENT PLACEMENT  05/2013  . CORONARY ANGIOPLASTY WITH STENT PLACEMENT  07/26/2013   DES to RCA extending to PDA    . CORONARY ANGIOPLASTY WITH STENT PLACEMENT  07/30/2016  . CORONARY ANGIOPLASTY WITH STENT PLACEMENT  2000   CAD  . DENTAL SURGERY  04/2016   "got infected; had to dig it out"  . EYE SURGERY    . KNEE ARTHROSCOPY Right 1990's   rt  . LEFT AND RIGHT HEART CATHETERIZATION WITH CORONARY ANGIOGRAM N/A 07/26/2013   Procedure: LEFT AND RIGHT HEART CATHETERIZATION WITH CORONARY ANGIOGRAM;  Surgeon:  [name], MD;  Location: [place] CATH LAB;  Service: Cardiovascular;  Laterality: N/A;  . LEFT HEART CATH AND CORONARY ANGIOGRAPHY N/A 02/26/2017   Procedure: Left Heart Cath and Coronary Angiography;  Surgeon: Sherren Mocha, MD;  Location: [place] CV LAB;  Service: Cardiovascular;  Laterality: N/A;  . LEFT HEART CATHETERIZATION WITH CORONARY ANGIOGRAM N/A 05/19/2013   Procedure: LEFT HEART CATHETERIZATION WITH CORONARY ANGIOGRAM;  Surgeon: [name], MD;  Location: [facility] CATH LAB;  Service: Cardiovascular;  Laterality: N/A;  . LUMBAR LAMINECTOMY/DECOMPRESSION MICRODISCECTOMY  08/30/2012   Procedure: LUMBAR LAMINECTOMY/DECOMPRESSION MICRODISCECTOMY 2 LEVELS;  Surgeon: [name], MD;  Location: [place] NEURO ORS;  Service: Neurosurgery;  Laterality: Bilateral;  Bilateral Lumbar three-four Lumbar four-five Laminotomies  . LUMBAR PERCUTANEOUS PEDICLE SCREW 1 LEVEL Bilateral 10/22/2017   Procedure: LUMBAR PERCUTANEOUS PEDICLE SCREW 1 LEVEL;  Surgeon: [name], MD;  Location: [place];  Service: Neurosurgery;  Laterality: Bilateral;  . LUMBAR SPINE SURGERY  04/07/2016   Dr. Ellene Route; "?ruptured disc"  . MELANOMA EXCISION Left 02/2011   forearm  . NASAL SINUS SURGERY  1970s   "cut windows in sinus pockets"  . PERCUTANEOUS CORONARY STENT INTERVENTION (PCI-S)  05/19/2013   Procedure: PERCUTANEOUS CORONARY STENT INTERVENTION (PCI-S);  Surgeon: [name], MD;  Location: [facility] CATH LAB;  Service: Cardiovascular;;  . ULNAR TUNNEL RELEASE Left 04/07/2016   Dr. Ellene Route    Family History  Problem Relation Age of Onset  . Cancer Mother        intestinal   . Heart disease Mother   . Cancer Father        prostate  . Migraines Daughter   . Leukemia Sister   . Heart disease Sister   . Prostate cancer Other   . Kidney cancer Other   . Cancer Other        Bladder cancer  . Coronary artery disease Other   . Hypertension Sister   . Hypertension Brother   . Heart attack Neg Hx   . Stroke Neg Hx       Social History   Socioeconomic History  . Marital status: Married    Spouse name: Not on file  . Number of children: Not on file  . Years of education: Not on file  . Highest education level: Not on file  Occupational History  . Occupation: [employer]  . Financial resource strain: Not on file  . Food insecurity:    Worry: Not on file    Inability: Not on file  . Transportation needs:    Medical: Not on file    Non-medical: Not on file  Tobacco Use  . Smoking status: Former Smoker    Packs/day: 3.00    Years: 30.00    Pack years: 90.00    Types: Cigarettes    Last attempt  to quit: 09/17/1996    Years since quitting: 22.1  . Smokeless tobacco: Never Used  Substance and Sexual Activity  . Alcohol use: No  . Drug use: No  . Sexual activity: Not Currently    Birth control/protection: None  Lifestyle  . Physical activity:    Days per week: Not on file    Minutes per session: Not on file  . Stress: Not on file  Relationships  . Social connections:    Talks on phone: Not on file    Gets together: Not on file    Attends religious service: Not on file    Active member of club or organization: Not on file    Attends meetings of clubs or organizations: Not on file    Relationship status: Not on file  . Intimate partner violence:    Fear of current or ex partner: Not on file    Emotionally abused: Not on file    Physically abused: Not on file    Forced sexual activity: Not on file  Other Topics Concern  . Not on file  Social History Narrative   Married and lives locally with his wife.  Sharyon Cable.    Outpatient Medications Prior to Visit  Medication Sig Dispense Refill  . acetaminophen (TYLENOL) 500 MG tablet Take 500 mg by mouth 2 (two) times daily.     Marland Kitchen albuterol (PROVENTIL HFA;VENTOLIN HFA) 108 (90 Base) MCG/ACT inhaler Inhale 2 puffs into the lungs every 6 (six) hours as needed for wheezing or shortness of breath. 1 Inhaler 0  . allopurinol (ZYLOPRIM)  300 MG tablet Take 1 tablet (300 mg total) by mouth daily. 90 tablet 1  . aspirin EC 81 MG tablet Take 81 mg by mouth daily.    Marland Kitchen atorvastatin (LIPITOR) 40 MG tablet Take 1 tablet (40 mg total) by mouth daily. 90 tablet 1  . B Complex Vitamins (B COMPLEX-B12 PO) Take 1 tablet by mouth every 7 (seven) days.     . BD INSULIN SYRINGE ULTRAFINE 31G X 5/16" 1 ML MISC USE  TO INJECT  INSULIN  FIVE  TIMES  DAILY AS INSTRUCTED 450 each 3  . chlorpheniramine (CHLOR-TRIMETON) 4 MG tablet Take 4 mg by mouth daily.    . Cholecalciferol (VITAMIN D3) 25 MCG (1000 UT) CAPS Take 1,000 Units by mouth at bedtime.    Marland Kitchen doxycycline (VIBRA-TABS) 100 MG tablet Take 1 tablet (100 mg total) by mouth 2 (two) times daily. Can give caps or generic 20 tablet 0  . fenofibrate 160 MG tablet Take 1 tablet (160 mg total) by mouth at bedtime. 90 tablet 1  . fluocinonide cream (LIDEX) 6.38 % Apply 1 application topically daily as needed (for rashes).     . fluticasone (FLONASE) 50 MCG/ACT nasal spray Place 2 sprays into both nostrils daily. (Patient taking differently: Place 2 sprays into both nostrils daily as needed for allergies or rhinitis. ) 16 g 1  . fluticasone (FLONASE) 50 MCG/ACT nasal spray Place 2 sprays into both nostrils daily. 16 g 1  . furosemide (LASIX) 40 MG tablet TAKE 1 AND 1/2 TABLETS EVERY DAY 135 tablet 3  . glucose blood (TRUE METRIX BLOOD GLUCOSE TEST) test strip USE TO TEST BLOOD SUGAR THREE TIMES DAILY AS DIRECTED 300 each 3  . HYDROcodone-acetaminophen (NORCO) 10-325 MG tablet Take 0.5 tablets by mouth 2 (two) times daily.  0  . insulin NPH Human (NOVOLIN N) 100 UNIT/ML injection Inject 45 units in am and 40 in the  evening (Patient taking differently: Inject 45-50 Units into the skin See admin instructions. Inject 45 units into the skin before breakfast and 50 units before supper/evening meal) 30 mL 3  . insulin regular (NOVOLIN R RELION) 100 units/mL injection Inject 0.25-0.3 mLs (25-30 Units total) into  the skin 3 (three) times daily before meals. (Patient taking differently: Inject 35 Units into the skin 3 (three) times daily before meals. ) 70 mL 3  . isosorbide mononitrate (IMDUR) 30 MG 24 hr tablet Take 1 tablet (30 mg total) by mouth daily. 90 tablet 3  . MAGNESIUM PO Take 1 tablet by mouth at bedtime.    . nitroGLYCERIN (NITROSTAT) 0.4 MG SL tablet Place 1 tablet (0.4 mg total) under the tongue every 5 (five) minutes as needed for chest pain. 100 tablet 3  . oseltamivir (TAMIFLU) 75 MG capsule Take 1 capsule (75 mg total) by mouth 2 (two) times daily. 10 capsule 0  . pantoprazole (PROTONIX) 20 MG tablet Take 1 tablet (20 mg total) by mouth daily. (Patient taking differently: Take 20 mg by mouth at bedtime. ) 90 tablet 3  . Probiotic Product (PROBIOTIC PO) Take 1 capsule by mouth at bedtime.     Marland Kitchen tiZANidine (ZANAFLEX) 4 MG tablet Take 4 mg by mouth 2 (two) times daily.  6  . HYDROcodone-homatropine (HYCODAN) 5-1.5 MG/5ML syrup Take 5 mLs by mouth every 6 (six) hours as needed for cough. 100 mL 0  . metoprolol tartrate (LOPRESSOR) 50 MG tablet Take 1 tablet (50 mg total) by mouth 2 (two) times daily. 180 tablet 1  . tamsulosin (FLOMAX) 0.4 MG CAPS capsule Take 1 capsule (0.4 mg total) by mouth daily. 90 capsule 1   No facility-administered medications prior to visit.     Allergies  Allergen Reactions  . Brilinta [Ticagrelor] Shortness Of Breath  . Levemir [Insulin Detemir] Swelling and Other (See Comments)    Patient had redness and swelling and tenderness at injection site.  . Codeine Other (See Comments)    Makes him "shakey", is OK with hydrocodone GI UPSET & TREMORS  . Lipitor [Atorvastatin] Other (See Comments)    Muscle aches; affects liver function- Patient is currently taking  . Novolog Mix [Insulin Aspart Prot & Aspart] Other (See Comments)    Causes skin to swell/itch at injection site    Review of Systems  Constitutional: Positive for malaise/fatigue. Negative for  fever.  HENT: Negative for congestion.   Eyes: Negative for blurred vision.  Respiratory: Negative for shortness of breath.   Cardiovascular: Negative for chest pain, palpitations and leg swelling.  Gastrointestinal: Negative for abdominal pain, blood in stool and nausea.  Genitourinary: Positive for frequency and urgency. Negative for dysuria, flank pain and hematuria.  Musculoskeletal: Positive for back pain. Negative for falls.  Skin: Negative for rash.  Neurological: Negative for dizziness, loss of consciousness and headaches.  Endo/Heme/Allergies: Negative for environmental allergies.  Psychiatric/Behavioral: Negative for depression. The patient is not nervous/anxious.        Objective:    Physical Exam Vitals signs and nursing note reviewed.  Constitutional:      General: He is not in acute distress.    Appearance: He is well-developed.  HENT:     Head: Normocephalic and atraumatic.     Nose: Nose normal.  Eyes:     General:        Right eye: No discharge.        Left eye: No discharge.  Neck:  Musculoskeletal: Normal range of motion and neck supple.  Cardiovascular:     Rate and Rhythm: Normal rate and regular rhythm.     Heart sounds: No murmur.  Pulmonary:     Effort: Pulmonary effort is normal.     Breath sounds: Normal breath sounds.  Abdominal:     General: Bowel sounds are normal.     Palpations: Abdomen is soft.     Tenderness: There is no abdominal tenderness.  Skin:    General: Skin is warm and dry.  Neurological:     Mental Status: He is alert and oriented to person, place, and time.     BP (!) 112/58 (BP Location: Left Arm, Patient Position: Sitting, Cuff Size: Normal)   Pulse 63   Temp (!) 97.3 F (36.3 C) (Oral)   Resp 18   Wt 256 lb 6.4 oz (116.3 kg)   SpO2 97%   BMI 36.79 kg/m  Wt Readings from Last 3 Encounters:  10/28/18 256 lb 6.4 oz (116.3 kg)  10/26/18 264 lb 12.8 oz (120.1 kg)  10/13/18 257 lb 6.4 oz (116.8 kg)     Lab  Results  Component Value Date   WBC 5.6 10/28/2018   HGB 12.6 (L) 10/28/2018   HCT 37.7 (L) 10/28/2018   PLT 202 10/28/2018   GLUCOSE 253 (H) 10/28/2018   CHOL 134 09/16/2018   TRIG 213 (H) 09/16/2018   HDL 25 (L) 09/16/2018   LDLDIRECT 69.0 01/24/2018   LDLCALC 66 09/16/2018   ALT 13 10/28/2018   AST 19 10/28/2018   NA 141 10/28/2018   K 4.5 10/28/2018   CL 101 10/28/2018   CREATININE 1.51 (H) 10/28/2018   BUN 25 10/28/2018   CO2 28 10/28/2018   TSH 3.39 01/24/2018   INR 1.1 02/24/2017   HGBA1C 6.1 (A) 05/10/2018   MICROALBUR 0.7 08/02/2014    Lab Results  Component Value Date   TSH 3.39 01/24/2018   Lab Results  Component Value Date   WBC 5.6 10/28/2018   HGB 12.6 (L) 10/28/2018   HCT 37.7 (L) 10/28/2018   MCV 92.6 10/28/2018   PLT 202 10/28/2018   Lab Results  Component Value Date   NA 141 10/28/2018   K 4.5 10/28/2018   CO2 28 10/28/2018   GLUCOSE 253 (H) 10/28/2018   BUN 25 10/28/2018   CREATININE 1.51 (H) 10/28/2018   BILITOT 0.7 10/28/2018   ALKPHOS 51 10/13/2018   AST 19 10/28/2018   ALT 13 10/28/2018   PROT 6.5 10/28/2018   ALBUMIN 3.9 10/13/2018   CALCIUM 10.0 10/28/2018   ANIONGAP 9 09/15/2018   GFR 45.91 (L) 10/13/2018   Lab Results  Component Value Date   CHOL 134 09/16/2018   Lab Results  Component Value Date   HDL 25 (L) 09/16/2018   Lab Results  Component Value Date   LDLCALC 66 09/16/2018   Lab Results  Component Value Date   TRIG 213 (H) 09/16/2018   Lab Results  Component Value Date   CHOLHDL 5.4 09/16/2018   Lab Results  Component Value Date   HGBA1C 6.1 (A) 05/10/2018       Assessment & Plan:   Problem List Items Addressed This Visit    Hyperlipidemia    Encouraged heart healthy diet, increase exercise, avoid trans fats, consider a krill oil cap daily      Relevant Medications   metoprolol tartrate (LOPRESSOR) 50 MG tablet   Essential hypertension    Well controlled, no changes  to meds. Encouraged heart  healthy diet such as the DASH diet and exercise as tolerated.       Relevant Medications   metoprolol tartrate (LOPRESSOR) 50 MG tablet   Other Relevant Orders   CBC w/Diff (Completed)   Comprehensive metabolic panel (Completed)   Lumbar radiculopathy    Encouraged moist heat and gentle stretching as tolerated. May try NSAIDs and prescription meds as directed and report if symptoms worsen or seek immediate care      Uncontrolled type 2 diabetes mellitus with circulatory disorder, with long-term current use of insulin (HCC)    hgba1c acceptable, minimize simple carbs. Increase exercise as tolerated. Continue current meds      Relevant Medications   metoprolol tartrate (LOPRESSOR) 50 MG tablet   Obesity    Encouraged DASH diet, decrease po intake and increase exercise as tolerated. Needs 7-8 hours of sleep nightly. Avoid trans fats, eat small, frequent meals every 4-5 hours with lean proteins, complex carbs and healthy fats. Minimize simple carbs      Overactive bladder - Primary    Try oxybutynin 5 mg po bid      Relevant Orders   Urine Culture (Completed)   Comprehensive metabolic panel (Completed)   Urinalysis (Completed)      I am having Elyn Aquas. Lorain Childes "[name]" start on oxybutynin. I am also having him maintain his fluocinonide cream, aspirin EC, acetaminophen, B Complex Vitamins (B COMPLEX-B12 PO), albuterol, BD INSULIN SYRINGE ULTRAFINE, fluticasone, insulin regular, chlorpheniramine, Probiotic Product (PROBIOTIC PO), nitroGLYCERIN, insulin NPH Human, fenofibrate, allopurinol, atorvastatin, pantoprazole, glucose blood, tiZANidine, Vitamin D3, MAGNESIUM PO, HYDROcodone-acetaminophen, furosemide, isosorbide mononitrate, doxycycline, fluticasone, oseltamivir, metoprolol tartrate, tamsulosin, and HYDROcodone-homatropine.  Meds ordered this encounter  Medications  . metoprolol tartrate (LOPRESSOR) 50 MG tablet    Sig: Take 1 tablet (50 mg total) by mouth 2 (two) times daily.     Dispense:  180 tablet    Refill:  1  . tamsulosin (FLOMAX) 0.4 MG CAPS capsule    Sig: Take 1 capsule (0.4 mg total) by mouth daily.    Dispense:  90 capsule    Refill:  1  . DISCONTD: HYDROcodone-homatropine (HYCODAN) 5-1.5 MG/5ML syrup    Sig: Take 5 mLs by mouth every 6 (six) hours as needed for cough.    Dispense:  150 mL    Refill:  0  . HYDROcodone-homatropine (HYCODAN) 5-1.5 MG/5ML syrup    Sig: Take 5 mLs by mouth every 6 (six) hours as needed for cough.    Dispense:  150 mL    Refill:  0  . oxybutynin (DITROPAN) 5 MG tablet    Sig: Take 1 tablet (5 mg total) by mouth 2 (two) times daily.    Dispense:  60 tablet    Refill:  1     Penni Homans, MD

## 2018-10-31 NOTE — Assessment & Plan Note (Signed)
hgba1c acceptable, minimize simple carbs. Increase exercise as tolerated. Continue current meds 

## 2018-10-31 NOTE — Assessment & Plan Note (Signed)
Try oxybutynin 5 mg po bid

## 2018-10-31 NOTE — Assessment & Plan Note (Signed)
Encouraged DASH diet, decrease po intake and increase exercise as tolerated. Needs 7-8 hours of sleep nightly. Avoid trans fats, eat small, frequent meals every 4-5 hours with lean proteins, complex carbs and healthy fats. Minimize simple carbs 

## 2018-10-31 NOTE — Assessment & Plan Note (Signed)
Encouraged moist heat and gentle stretching as tolerated. May try NSAIDs and prescription meds as directed and report if symptoms worsen or seek immediate care 

## 2018-11-02 ENCOUNTER — Ambulatory Visit (INDEPENDENT_AMBULATORY_CARE_PROVIDER_SITE_OTHER): Payer: Medicare HMO | Admitting: Medical

## 2018-11-02 ENCOUNTER — Encounter: Payer: Self-pay | Admitting: Medical

## 2018-11-02 VITALS — BP 112/70 | HR 55 | Temp 97.5°F | Resp 18 | Ht 70.0 in | Wt 260.0 lb

## 2018-11-02 DIAGNOSIS — R600 Localized edema: Secondary | ICD-10-CM

## 2018-11-02 DIAGNOSIS — N419 Inflammatory disease of prostate, unspecified: Secondary | ICD-10-CM

## 2018-11-02 DIAGNOSIS — R350 Frequency of micturition: Secondary | ICD-10-CM

## 2018-11-02 DIAGNOSIS — I517 Cardiomegaly: Secondary | ICD-10-CM

## 2018-11-02 DIAGNOSIS — R300 Dysuria: Secondary | ICD-10-CM | POA: Diagnosis not present

## 2018-11-02 LAB — CBC WITH DIFFERENTIAL/PLATELET
Basophils Absolute: 0 10*3/uL (ref 0.0–0.1)
Basophils Relative: 0.5 % (ref 0.0–3.0)
Eosinophils Absolute: 0.2 10*3/uL (ref 0.0–0.7)
Eosinophils Relative: 2.9 % (ref 0.0–5.0)
HCT: 37.2 % — ABNORMAL LOW (ref 39.0–52.0)
HEMOGLOBIN: 12.5 g/dL — AB (ref 13.0–17.0)
Lymphocytes Relative: 31.2 % (ref 12.0–46.0)
Lymphs Abs: 1.9 10*3/uL (ref 0.7–4.0)
MCHC: 33.5 g/dL (ref 30.0–36.0)
MCV: 92.7 fl (ref 78.0–100.0)
Monocytes Absolute: 0.4 10*3/uL (ref 0.1–1.0)
Monocytes Relative: 5.9 % (ref 3.0–12.0)
Neutro Abs: 3.7 10*3/uL (ref 1.4–7.7)
Neutrophils Relative %: 59.5 % (ref 43.0–77.0)
Platelets: 190 10*3/uL (ref 150.0–400.0)
RBC: 4.02 Mil/uL — ABNORMAL LOW (ref 4.22–5.81)
RDW: 14.9 % (ref 11.5–15.5)
WBC: 6.2 10*3/uL (ref 4.0–10.5)

## 2018-11-02 LAB — COMPREHENSIVE METABOLIC PANEL
ALT: 14 U/L (ref 0–53)
AST: 15 U/L (ref 0–37)
Albumin: 3.9 g/dL (ref 3.5–5.2)
Alkaline Phosphatase: 57 U/L (ref 39–117)
BILIRUBIN TOTAL: 0.4 mg/dL (ref 0.2–1.2)
BUN: 22 mg/dL (ref 6–23)
CO2: 33 mEq/L — ABNORMAL HIGH (ref 19–32)
Calcium: 10.3 mg/dL (ref 8.4–10.5)
Chloride: 102 mEq/L (ref 96–112)
Creatinine, Ser: 1.52 mg/dL — ABNORMAL HIGH (ref 0.40–1.50)
GFR: 44.5 mL/min — ABNORMAL LOW (ref >60.00)
Glucose, Bld: 150 mg/dL — ABNORMAL HIGH (ref 70–99)
Potassium: 4.3 mEq/L (ref 3.5–5.1)
Sodium: 141 mEq/L (ref 135–145)
Total Protein: 6.5 g/dL (ref 6.0–8.3)

## 2018-11-02 LAB — PSA: PSA: 1.01 ng/mL (ref 0.10–4.00)

## 2018-11-02 LAB — POC URINALSYSI DIPSTICK (AUTOMATED)
BILIRUBIN UA: NEGATIVE
Glucose, UA: NEGATIVE
Ketones, UA: NEGATIVE
NITRITE UA: NEGATIVE
Protein, UA: NEGATIVE
Spec Grav, UA: 1.015 (ref 1.010–1.025)
Urobilinogen, UA: NEGATIVE E.U./dL — AB
pH, UA: 6 (ref 5.0–8.0)

## 2018-11-02 LAB — BRAIN NATRIURETIC PEPTIDE: Pro B Natriuretic peptide (BNP): 53 pg/mL (ref 0.0–100.0)

## 2018-11-02 MED ORDER — SULFAMETHOXAZOLE-TRIMETHOPRIM 800-160 MG PO TABS: 1.0000 | ORAL_TABLET | Freq: Two times a day (BID) | ORAL | 0 refills | Status: DC

## 2018-11-02 MED ORDER — CEFTRIAXONE SODIUM 1 G IJ SOLR
1.0000 g | Freq: Once | INTRAMUSCULAR | Status: AC
  Administered 2018-11-02: 1 g via INTRAMUSCULAR

## 2018-11-02 NOTE — Progress Notes (Signed)
Subjective:    Patient ID: William Hendrix, male    DOB: [birth year], 78 y.o.   MRN: [id number]  HPI  Pt in with some pain on urination. Symptoms just started. Pt was describing some urge incontinence/overactive bladder the other day. So Dr. Charlett Blake gave him ditropan. Pt states the other night he was having to urinate every 5 minutes early this morning. He states stream of urine 2-3 days ago.   Pt denies any fever, no chills or sweats. No perineum pain. Pt has some chronic bilateral flank pain in past. This is not new or worse.  Pt urinates when he came into office then had some difficulty giving sample.  Pt unaware that he ever had prostatitis.  Pt states 20 lb weight gain in past 5 weeks. No pedal edema presently. 2 days ago did have severe pedal edema per wife.  Pt cxr early January showed no pleural effusion.       Review of Systems  Constitutional: Negative for chills, fatigue and fever.  Respiratory: Negative for cough, shortness of breath and wheezing.        Wife thinks he has some dyspnea when doing things around house. No chest pain.  Cardiovascular: Negative for chest pain and palpitations.  Gastrointestinal: Negative for abdominal pain, constipation, nausea and vomiting.  Genitourinary: Positive for dysuria, frequency and urgency. Negative for penile swelling and testicular pain.  Musculoskeletal: Negative for neck pain.  Skin: Negative for rash.  Neurological: Negative for dizziness, speech difficulty, weakness and headaches.  Hematological: Negative for adenopathy. Does not bruise/bleed easily.  Psychiatric/Behavioral: Negative for behavioral problems, confusion and sleep disturbance. The patient is not nervous/anxious.     Past Medical History:  Diagnosis Date  . AAA (abdominal aortic aneurysm) ([place]) 12/15/2014   a. Mild aneurysmal dilatation of the infrarenal abdominal aorta 3.2 cm - f/u due by 2019.  . Arthritis    "all over" (07/30/2016)  . CAD (coronary  artery disease)    a. stent to LAD 2000. b. possible spasm by cath 2001. c. IVUS/PTCA/DES to mLAD 05/2013. d. PTCA/DES of dRCA into ostial rPDA 07/2013. e. PTCA of OM2, DES to Cx, DES to [facility] 07/2016.  Marland Kitchen Chronic bronchitis ([place])   . Chronic diastolic CHF (congestive heart failure) ([place])   . Chronic lower back pain   . Chronic neck pain   . CKD (chronic kidney disease), stage III ([place])   . Diabetic peripheral neuropathy ([place])   . Ejection fraction    55%, 07/2010, mild inferior hypo  . GERD (gastroesophageal reflux disease)   . Gout   . H/O diverticulitis of colon 01/31/2015  . H/O hiatal hernia   . History of blood transfusion ~ 10/1940   "had pneumonia"  . History of kidney stones   . HTN (hypertension)   . Hyperlipidemia   . Melanoma of forearm, left ([place]) 02/2011   with wide excision   . Myocardial infarction ([place])   . Nephrolithiasis   . Obesity   . OSA on CPAP     [name] since 2000  . Peripheral neuropathy    both feet  . Positive D-dimer    a. significant elevation ,hospital 07/2010, etiology unclear. b. D Dimer chronically > 20.  Marland Kitchen Renal insufficiency   . Skin cancer   . Spinal stenosis    a. s/p surgical repair 2013  . Spinal stenosis of lumbar region   . Type II diabetes mellitus ([place])   . Ventral hernia  Social History   Socioeconomic History  . Marital status: Married    Spouse name: Not on file  . Number of children: Not on file  . Years of education: Not on file  . Highest education level: Not on file  Occupational History  . Occupation: [employer]  . Financial resource strain: Not on file  . Food insecurity:    Worry: Not on file    Inability: Not on file  . Transportation needs:    Medical: Not on file    Non-medical: Not on file  Tobacco Use  . Smoking status: Former Smoker    Packs/day: 3.00    Years: 30.00    Pack years: 90.00    Types: Cigarettes    Last attempt to quit: 09/17/1996    Years since quitting: 22.1  .  Smokeless tobacco: Never Used  Substance and Sexual Activity  . Alcohol use: No  . Drug use: No  . Sexual activity: Not Currently    Birth control/protection: None  Lifestyle  . Physical activity:    Days per week: Not on file    Minutes per session: Not on file  . Stress: Not on file  Relationships  . Social connections:    Talks on phone: Not on file    Gets together: Not on file    Attends religious service: Not on file    Active member of club or organization: Not on file    Attends meetings of clubs or organizations: Not on file    Relationship status: Not on file  . Intimate partner violence:    Fear of current or ex partner: Not on file    Emotionally abused: Not on file    Physically abused: Not on file    Forced sexual activity: Not on file  Other Topics Concern  . Not on file  Social History Narrative   Married and lives locally with his wife.  Sharyon Cable.    Past Surgical History:  Procedure Laterality Date  . ANTERIOR LAT LUMBAR FUSION  10/22/2017   Procedure: Lumbar two-three Lumbar three-four Lumbar four-five Anteriolateral lumbar interbody fusion with percutaneous pedicle screw fixation and infuse;  Surgeon: [name], MD;  Location: [place];  Service: Neurosurgery;;  . APPLICATION OF ROBOTIC ASSISTANCE FOR SPINAL PROCEDURE  10/22/2017   Procedure: APPLICATION OF ROBOTIC ASSISTANCE FOR SPINAL PROCEDURE;  Surgeon: [name], MD;  Location: [place];  Service: Neurosurgery;;  . BACK SURGERY    . CARDIAC CATHETERIZATION N/A 07/30/2016   Procedure: Left Heart Cath and Coronary Angiography;  Surgeon: Nelva Bush, MD;  Location: [place] CV LAB;  Service: Cardiovascular;  Laterality: N/A;  . CARDIAC CATHETERIZATION N/A 07/30/2016   Procedure: Coronary Stent Intervention;  Surgeon: Nelva Bush, MD;  Location: [place] CV LAB;  Service: Cardiovascular;  Laterality: N/A;  Mid CFX and MID RCA  . CARDIAC CATHETERIZATION N/A 07/30/2016   Procedure: Coronary  Balloon Angioplasty;  Surgeon: Nelva Bush, MD;  Location: [place] CV LAB;  Service: Cardiovascular;  Laterality: N/A;  OM 1  . CARPAL TUNNEL RELEASE Left   . CATARACT EXTRACTION W/ INTRAOCULAR LENS  IMPLANT, BILATERAL Bilateral ~ 2014  . CERVICAL DISC SURGERY  1990s  . CORONARY ANGIOPLASTY WITH STENT PLACEMENT  05/2013  . CORONARY ANGIOPLASTY WITH STENT PLACEMENT  07/26/2013   DES to RCA extending to PDA    . CORONARY ANGIOPLASTY WITH STENT PLACEMENT  07/30/2016  . CORONARY ANGIOPLASTY WITH STENT PLACEMENT  2000  CAD  . DENTAL SURGERY  04/2016   "got infected; had to dig it out"  . EYE SURGERY    . KNEE ARTHROSCOPY Right 1990's   rt  . LEFT AND RIGHT HEART CATHETERIZATION WITH CORONARY ANGIOGRAM N/A 07/26/2013   Procedure: LEFT AND RIGHT HEART CATHETERIZATION WITH CORONARY ANGIOGRAM;  Surgeon: [name], MD;  Location: [place] CATH LAB;  Service: Cardiovascular;  Laterality: N/A;  . LEFT HEART CATH AND CORONARY ANGIOGRAPHY N/A 02/26/2017   Procedure: Left Heart Cath and Coronary Angiography;  Surgeon: Sherren Mocha, MD;  Location: [place] CV LAB;  Service: Cardiovascular;  Laterality: N/A;  . LEFT HEART CATHETERIZATION WITH CORONARY ANGIOGRAM N/A 05/19/2013   Procedure: LEFT HEART CATHETERIZATION WITH CORONARY ANGIOGRAM;  Surgeon: [name], MD;  Location: [facility] CATH LAB;  Service: Cardiovascular;  Laterality: N/A;  . LUMBAR LAMINECTOMY/DECOMPRESSION MICRODISCECTOMY  08/30/2012   Procedure: LUMBAR LAMINECTOMY/DECOMPRESSION MICRODISCECTOMY 2 LEVELS;  Surgeon: [name], MD;  Location: [place] NEURO ORS;  Service: Neurosurgery;  Laterality: Bilateral;  Bilateral Lumbar three-four Lumbar four-five Laminotomies  . LUMBAR PERCUTANEOUS PEDICLE SCREW 1 LEVEL Bilateral 10/22/2017   Procedure: LUMBAR PERCUTANEOUS PEDICLE SCREW 1 LEVEL;  Surgeon: [name], MD;  Location: [place];  Service: Neurosurgery;  Laterality: Bilateral;  . LUMBAR SPINE SURGERY  04/07/2016   Dr. Ellene Route;  "?ruptured disc"  . MELANOMA EXCISION Left 02/2011   forearm  . NASAL SINUS SURGERY  1970s   "cut windows in sinus pockets"  . PERCUTANEOUS CORONARY STENT INTERVENTION (PCI-S)  05/19/2013   Procedure: PERCUTANEOUS CORONARY STENT INTERVENTION (PCI-S);  Surgeon: [name], MD;  Location: [facility] CATH LAB;  Service: Cardiovascular;;  . ULNAR TUNNEL RELEASE Left 04/07/2016   Dr. Ellene Route    Family History  Problem Relation Age of Onset  . Cancer Mother        intestinal   . Heart disease Mother   . Cancer Father        prostate  . Migraines Daughter   . Leukemia Sister   . Heart disease Sister   . Prostate cancer Other   . Kidney cancer Other   . Cancer Other        Bladder cancer  . Coronary artery disease Other   . Hypertension Sister   . Hypertension Brother   . Heart attack Neg Hx   . Stroke Neg Hx     Allergies  Allergen Reactions  . Brilinta [Ticagrelor] Shortness Of Breath  . Levemir [Insulin Detemir] Swelling and Other (See Comments)    Patient had redness and swelling and tenderness at injection site.  . Codeine Other (See Comments)    Makes him "shakey", is OK with hydrocodone GI UPSET & TREMORS  . Lipitor [Atorvastatin] Other (See Comments)    Muscle aches; affects liver function- Patient is currently taking  . Novolog Mix [Insulin Aspart Prot & Aspart] Other (See Comments)    Causes skin to swell/itch at injection site    Current Outpatient Medications on File Prior to Visit  Medication Sig Dispense Refill  . acetaminophen (TYLENOL) 500 MG tablet Take 500 mg by mouth 2 (two) times daily.     Marland Kitchen albuterol (PROVENTIL HFA;VENTOLIN HFA) 108 (90 Base) MCG/ACT inhaler Inhale 2 puffs into the lungs every 6 (six) hours as needed for wheezing or shortness of breath. 1 Inhaler 0  . allopurinol (ZYLOPRIM) 300 MG tablet Take 1 tablet (300 mg total) by mouth daily. 90 tablet 1  . aspirin EC 81 MG tablet Take 81  mg by mouth daily.    Marland Kitchen atorvastatin (LIPITOR) 40 MG tablet  Take 1 tablet (40 mg total) by mouth daily. 90 tablet 1  . B Complex Vitamins (B COMPLEX-B12 PO) Take 1 tablet by mouth every 7 (seven) days.     . BD INSULIN SYRINGE ULTRAFINE 31G X 5/16" 1 ML MISC USE  TO INJECT  INSULIN  FIVE  TIMES  DAILY AS INSTRUCTED 450 each 3  . chlorpheniramine (CHLOR-TRIMETON) 4 MG tablet Take 4 mg by mouth daily.    . Cholecalciferol (VITAMIN D3) 25 MCG (1000 UT) CAPS Take 1,000 Units by mouth at bedtime.    Marland Kitchen doxycycline (VIBRA-TABS) 100 MG tablet Take 1 tablet (100 mg total) by mouth 2 (two) times daily. Can give caps or generic 20 tablet 0  . fenofibrate 160 MG tablet Take 1 tablet (160 mg total) by mouth at bedtime. 90 tablet 1  . fluocinonide cream (LIDEX) 1.61 % Apply 1 application topically daily as needed (for rashes).     . fluticasone (FLONASE) 50 MCG/ACT nasal spray Place 2 sprays into both nostrils daily. (Patient taking differently: Place 2 sprays into both nostrils daily as needed for allergies or rhinitis. ) 16 g 1  . fluticasone (FLONASE) 50 MCG/ACT nasal spray Place 2 sprays into both nostrils daily. 16 g 1  . furosemide (LASIX) 40 MG tablet TAKE 1 AND 1/2 TABLETS EVERY DAY 135 tablet 3  . glucose blood (TRUE METRIX BLOOD GLUCOSE TEST) test strip USE TO TEST BLOOD SUGAR THREE TIMES DAILY AS DIRECTED 300 each 3  . HYDROcodone-acetaminophen (NORCO) 10-325 MG tablet Take 0.5 tablets by mouth 2 (two) times daily.  0  . HYDROcodone-homatropine (HYCODAN) 5-1.5 MG/5ML syrup Take 5 mLs by mouth every 6 (six) hours as needed for cough. 150 mL 0  . insulin NPH Human (NOVOLIN N) 100 UNIT/ML injection Inject 45 units in am and 40 in the evening (Patient taking differently: Inject 45-50 Units into the skin See admin instructions. Inject 45 units into the skin before breakfast and 50 units before supper/evening meal) 30 mL 3  . insulin regular (NOVOLIN R RELION) 100 units/mL injection Inject 0.25-0.3 mLs (25-30 Units total) into the skin 3 (three) times daily before  meals. (Patient taking differently: Inject 35 Units into the skin 3 (three) times daily before meals. ) 70 mL 3  . isosorbide mononitrate (IMDUR) 30 MG 24 hr tablet Take 1 tablet (30 mg total) by mouth daily. 90 tablet 3  . MAGNESIUM PO Take 1 tablet by mouth at bedtime.    . metoprolol tartrate (LOPRESSOR) 50 MG tablet Take 1 tablet (50 mg total) by mouth 2 (two) times daily. 180 tablet 1  . nitroGLYCERIN (NITROSTAT) 0.4 MG SL tablet Place 1 tablet (0.4 mg total) under the tongue every 5 (five) minutes as needed for chest pain. 100 tablet 3  . oseltamivir (TAMIFLU) 75 MG capsule Take 1 capsule (75 mg total) by mouth 2 (two) times daily. 10 capsule 0  . oxybutynin (DITROPAN) 5 MG tablet Take 1 tablet (5 mg total) by mouth 2 (two) times daily. 60 tablet 1  . pantoprazole (PROTONIX) 20 MG tablet Take 1 tablet (20 mg total) by mouth daily. (Patient taking differently: Take 20 mg by mouth at bedtime. ) 90 tablet 3  . Probiotic Product (PROBIOTIC PO) Take 1 capsule by mouth at bedtime.     . tamsulosin (FLOMAX) 0.4 MG CAPS capsule Take 1 capsule (0.4 mg total) by mouth daily. 90 capsule 1  .  tiZANidine (ZANAFLEX) 4 MG tablet Take 4 mg by mouth 2 (two) times daily.  6   No current facility-administered medications on file prior to visit.     BP 112/70 (BP Location: Right Arm, Patient Position: Sitting, Cuff Size: Large)   Pulse (!) 55   Temp (!) 97.5 F (36.4 C) (Oral)   Resp 18   Ht 5\' 10"  (1.778 m)   Wt 260 lb (117.9 kg)   SpO2 97%   BMI 37.31 kg/m       Objective:   Physical Exam  General- No acute distress. Pleasant patient. Neck- Full range of motion, no jvd Lungs- Clear, even and unlabored. Heart- regular rate and rhythm. Neurologic- CNII- XII grossly intact.  Abdomen- soft, nt, nd, +bs, no rebound or guarding. Suprapubic tenderness faint.  Back - faint bilateral cva tenderness.  Lower ext- faint 1+ pedal edema. Negative.      Assessment & Plan:  For your recent dysuria,  and frequent urination, we will get urinalysis, urine culture and PSA.  You might have urinary tract infection and prostatitis.  We gave you 1 g Rocephin IM in the office today.  Also will prescribe Bactrim DS antibiotic.   In addition to PSA, I I would recommend DRE in the near future but not presently in the event that you have prostatitis.  You have history of pedal edema, recent weight gain and some cardiomegaly.  Chest x-ray done early in the month was negative for CHF.  Will get BNP all lab today.  If that is elevated then will have you come back for chest x-ray.  Also want you to notify us if pedal edema does not come down despite elevation of legs at night.  Or if you continue to report a weight gain.  Also notify us for any worsening dyspnea.  Follow-up in 7 to 10 days or as needed.  40 minutes spent with patient today.  50% of time spent counseling regarding diagnoses and approach going forward.

## 2018-11-02 NOTE — Patient Instructions (Signed)
  For your recent dysuria, and frequent urination, we will get urinalysis, urine culture and PSA.  You might have urinary tract infection and prostatitis.  We gave you 1 g Rocephin IM in the office today.  Also will prescribe Bactrim DS antibiotic.   In addition to PSA, I I would recommend DRE in the near future but not presently in the event that you have prostatitis.  You have history of pedal edema, recent weight gain and some cardiomegaly.  Chest x-ray done early in the month was negative for CHF.  Will get BNP all lab today.  If that is elevated then will have you come back for chest x-ray.  Also want you to notify us if pedal edema does not come down despite elevation of legs at night.  Or if you continue to report a weight gain.  Also notify us for any worsening dyspnea.  Follow-up in 7 to 10 days or as needed.

## 2018-11-04 LAB — URINE CULTURE
MICRO NUMBER:: 88913
SPECIMEN QUALITY:: ADEQUATE

## 2018-11-10 ENCOUNTER — Encounter: Payer: Self-pay | Admitting: Medical

## 2018-11-10 ENCOUNTER — Ambulatory Visit (INDEPENDENT_AMBULATORY_CARE_PROVIDER_SITE_OTHER): Payer: Medicare HMO | Admitting: Medical

## 2018-11-10 ENCOUNTER — Telehealth: Payer: Self-pay | Admitting: Medical

## 2018-11-10 ENCOUNTER — Telehealth: Payer: Self-pay | Admitting: Family Medicine

## 2018-11-10 VITALS — BP 104/43 | HR 63 | Temp 98.1°F | Resp 16 | Ht 72.0 in | Wt 256.0 lb

## 2018-11-10 DIAGNOSIS — R944 Abnormal results of kidney function studies: Secondary | ICD-10-CM

## 2018-11-10 DIAGNOSIS — R42 Dizziness and giddiness: Secondary | ICD-10-CM | POA: Diagnosis not present

## 2018-11-10 DIAGNOSIS — I959 Hypotension, unspecified: Secondary | ICD-10-CM | POA: Diagnosis not present

## 2018-11-10 DIAGNOSIS — N390 Urinary tract infection, site not specified: Secondary | ICD-10-CM | POA: Diagnosis not present

## 2018-11-10 DIAGNOSIS — R300 Dysuria: Secondary | ICD-10-CM

## 2018-11-10 DIAGNOSIS — R5383 Other fatigue: Secondary | ICD-10-CM

## 2018-11-10 DIAGNOSIS — R350 Frequency of micturition: Secondary | ICD-10-CM

## 2018-11-10 LAB — POC URINALSYSI DIPSTICK (AUTOMATED)
Bilirubin, UA: NEGATIVE
Blood, UA: NEGATIVE
Glucose, UA: NEGATIVE
Ketones, UA: NEGATIVE
LEUKOCYTES UA: NEGATIVE
Nitrite, UA: NEGATIVE
Protein, UA: POSITIVE — AB
Spec Grav, UA: 1.02 (ref 1.010–1.025)
Urobilinogen, UA: NEGATIVE E.U./dL — AB
pH, UA: 6 (ref 5.0–8.0)

## 2018-11-10 LAB — COMPREHENSIVE METABOLIC PANEL
ALT: 18 U/L (ref 0–53)
AST: 19 U/L (ref 0–37)
Albumin: 4.1 g/dL (ref 3.5–5.2)
Alkaline Phosphatase: 54 U/L (ref 39–117)
BUN: 29 mg/dL — ABNORMAL HIGH (ref 6–23)
CO2: 30 meq/L (ref 19–32)
Calcium: 10.5 mg/dL (ref 8.4–10.5)
Chloride: 103 mEq/L (ref 96–112)
Creatinine, Ser: 1.99 mg/dL — ABNORMAL HIGH (ref 0.40–1.50)
GFR: 32.61 mL/min — ABNORMAL LOW (ref >60.00)
Glucose, Bld: 263 mg/dL — ABNORMAL HIGH (ref 70–99)
Potassium: 4.8 mEq/L (ref 3.5–5.1)
Sodium: 140 mEq/L (ref 135–145)
Total Bilirubin: 0.4 mg/dL (ref 0.2–1.2)
Total Protein: 7 g/dL (ref 6.0–8.3)

## 2018-11-10 LAB — CBC WITH DIFFERENTIAL/PLATELET
Basophils Absolute: 0 10*3/uL (ref 0.0–0.1)
Basophils Relative: 0.6 % (ref 0.0–3.0)
Eosinophils Absolute: 0.2 10*3/uL (ref 0.0–0.7)
Eosinophils Relative: 3.4 % (ref 0.0–5.0)
HCT: 38.8 % — ABNORMAL LOW (ref 39.0–52.0)
Hemoglobin: 12.8 g/dL — ABNORMAL LOW (ref 13.0–17.0)
Lymphocytes Relative: 35.7 % (ref 12.0–46.0)
Lymphs Abs: 1.9 10*3/uL (ref 0.7–4.0)
MCHC: 33 g/dL (ref 30.0–36.0)
MCV: 93.7 fl (ref 78.0–100.0)
Monocytes Absolute: 0.3 10*3/uL (ref 0.1–1.0)
Monocytes Relative: 5.6 % (ref 3.0–12.0)
NEUTROS PCT: 54.7 % (ref 43.0–77.0)
Neutro Abs: 2.8 10*3/uL (ref 1.4–7.7)
Platelets: 200 10*3/uL (ref 150.0–400.0)
RBC: 4.14 Mil/uL — ABNORMAL LOW (ref 4.22–5.81)
RDW: 15.1 % (ref 11.5–15.5)
WBC: 5.2 10*3/uL (ref 4.0–10.5)

## 2018-11-10 MED ORDER — SULFAMETHOXAZOLE-TRIMETHOPRIM 800-160 MG PO TABS: 1.0000 | ORAL_TABLET | Freq: Two times a day (BID) | ORAL | 0 refills | Status: DC

## 2018-11-10 NOTE — Progress Notes (Signed)
Subjective:    Patient ID: William Hendrix, male    DOB: Feb 20, 1941, 78 y.o.   MRN: [id number]  HPI  Pt in for follow up from last visit.  Pt had uti. Culture came back positive. Pt not having fevers, chills or sweats. Not having any more pain on urination. Pt thinks urinating better with just one tablet of ditropan. He feels that was urinating more difficulty with ditropan twice daily.  Pt psa was normal on last visit.   Pt this morning felt more dizziness than usually. He states some mild dizziness in past intermittent with rapid head movements. Some fatigue today. No gross motor or sensory function deficits.   Sitting has not dizziness today on exam. But today before he came in was very weak. Describes unstable. He thought might fall earlier  Pt states ambulating into office today he felt like he had severely off balance.  This morning in kitchen was standing and felt near syncope. No preceding chest pain. No other associated neurologic signs or symptoms.     Review of Systems  Constitutional: Positive for fatigue. Negative for chills and fever.  HENT: Negative for congestion, drooling, facial swelling, mouth sores, sinus pain and sore throat.   Eyes: Negative for pain, redness and itching.  Respiratory: Negative for cough, chest tightness, shortness of breath and wheezing.   Cardiovascular: Negative for chest pain and palpitations.  Gastrointestinal: Negative for abdominal pain, blood in stool, constipation, diarrhea, nausea and vomiting.  Endocrine: Negative for polydipsia, polyphagia and polyuria.  Genitourinary: Negative for dysuria, flank pain, frequency, penile pain, testicular pain and urgency.       Urine symptoms cleared but when gave sample today states stung little.  Musculoskeletal: Negative for back pain and gait problem.  Skin: Negative for rash.  Neurological: Positive for dizziness. Negative for seizures, facial asymmetry, speech difficulty and weakness.    Hematological: Negative for adenopathy.  Psychiatric/Behavioral: Negative for behavioral problems, confusion, decreased concentration and suicidal ideas. The patient is not hyperactive.     Past Medical History:  Diagnosis Date  . AAA (abdominal aortic aneurysm) ([place]) 12/15/2014   a. Mild aneurysmal dilatation of the infrarenal abdominal aorta 3.2 cm - f/u due by 2019.  . Arthritis    "all over" (07/30/2016)  . CAD (coronary artery disease)    a. stent to LAD 2000. b. possible spasm by cath 2001. c. IVUS/PTCA/DES to mLAD 05/2013. d. PTCA/DES of dRCA into ostial rPDA 07/2013. e. PTCA of OM2, DES to Cx, DES to [facility] 07/2016.  Marland Kitchen Chronic bronchitis ([place])   . Chronic diastolic CHF (congestive heart failure) ([place])   . Chronic lower back pain   . Chronic neck pain   . CKD (chronic kidney disease), stage III ([place])   . Diabetic peripheral neuropathy ([place])   . Ejection fraction    55%, 07/2010, mild inferior hypo  . GERD (gastroesophageal reflux disease)   . Gout   . H/O diverticulitis of colon 01/31/2015  . H/O hiatal hernia   . History of blood transfusion ~ 10/1940   "had pneumonia"  . History of kidney stones   . HTN (hypertension)   . Hyperlipidemia   . Melanoma of forearm, left ([place]) 02/2011   with wide excision   . Myocardial infarction ([place])   . Nephrolithiasis   . Obesity   . OSA on CPAP     [name] since 2000  . Peripheral neuropathy    both feet  . Positive D-dimer  a. significant elevation ,hospital 07/2010, etiology unclear. b. D Dimer chronically > 20.  Marland Kitchen Renal insufficiency   . Skin cancer   . Spinal stenosis    a. s/p surgical repair 2013  . Spinal stenosis of lumbar region   . Type II diabetes mellitus ([place])   . Ventral hernia      Social History   Socioeconomic History  . Marital status: Married    Spouse name: Not on file  . Number of children: Not on file  . Years of education: Not on file  . Highest education level: Not on file  Occupational History   . Occupation: [employer]  . Financial resource strain: Not on file  . Food insecurity:    Worry: Not on file    Inability: Not on file  . Transportation needs:    Medical: Not on file    Non-medical: Not on file  Tobacco Use  . Smoking status: Former Smoker    Packs/day: 3.00    Years: 30.00    Pack years: 90.00    Types: Cigarettes    Last attempt to quit: 09/17/1996    Years since quitting: 22.1  . Smokeless tobacco: Never Used  Substance and Sexual Activity  . Alcohol use: No  . Drug use: No  . Sexual activity: Not Currently    Birth control/protection: None  Lifestyle  . Physical activity:    Days per week: Not on file    Minutes per session: Not on file  . Stress: Not on file  Relationships  . Social connections:    Talks on phone: Not on file    Gets together: Not on file    Attends religious service: Not on file    Active member of club or organization: Not on file    Attends meetings of clubs or organizations: Not on file    Relationship status: Not on file  . Intimate partner violence:    Fear of current or ex partner: Not on file    Emotionally abused: Not on file    Physically abused: Not on file    Forced sexual activity: Not on file  Other Topics Concern  . Not on file  Social History Narrative   Married and lives locally with his wife.  Sharyon Cable.    Past Surgical History:  Procedure Laterality Date  . ANTERIOR LAT LUMBAR FUSION  10/22/2017   Procedure: Lumbar two-three Lumbar three-four Lumbar four-five Anteriolateral lumbar interbody fusion with percutaneous pedicle screw fixation and infuse;  Surgeon: [name], MD;  Location: [place];  Service: Neurosurgery;;  . APPLICATION OF ROBOTIC ASSISTANCE FOR SPINAL PROCEDURE  10/22/2017   Procedure: APPLICATION OF ROBOTIC ASSISTANCE FOR SPINAL PROCEDURE;  Surgeon: [name], MD;  Location: [place];  Service: Neurosurgery;;  . BACK SURGERY    . CARDIAC CATHETERIZATION N/A 07/30/2016    Procedure: Left Heart Cath and Coronary Angiography;  Surgeon: Nelva Bush, MD;  Location: [place] CV LAB;  Service: Cardiovascular;  Laterality: N/A;  . CARDIAC CATHETERIZATION N/A 07/30/2016   Procedure: Coronary Stent Intervention;  Surgeon: Nelva Bush, MD;  Location: [place] CV LAB;  Service: Cardiovascular;  Laterality: N/A;  Mid CFX and MID RCA  . CARDIAC CATHETERIZATION N/A 07/30/2016   Procedure: Coronary Balloon Angioplasty;  Surgeon: Nelva Bush, MD;  Location: [place] CV LAB;  Service: Cardiovascular;  Laterality: N/A;  OM 1  . CARPAL TUNNEL RELEASE Left   . CATARACT EXTRACTION W/ INTRAOCULAR LENS  IMPLANT, BILATERAL Bilateral ~ 2014  . CERVICAL DISC SURGERY  1990s  . CORONARY ANGIOPLASTY WITH STENT PLACEMENT  05/2013  . CORONARY ANGIOPLASTY WITH STENT PLACEMENT  07/26/2013   DES to RCA extending to PDA    . CORONARY ANGIOPLASTY WITH STENT PLACEMENT  07/30/2016  . CORONARY ANGIOPLASTY WITH STENT PLACEMENT  2000   CAD  . DENTAL SURGERY  04/2016   "got infected; had to dig it out"  . EYE SURGERY    . KNEE ARTHROSCOPY Right 1990's   rt  . LEFT AND RIGHT HEART CATHETERIZATION WITH CORONARY ANGIOGRAM N/A 07/26/2013   Procedure: LEFT AND RIGHT HEART CATHETERIZATION WITH CORONARY ANGIOGRAM;  Surgeon: [name], MD;  Location: [place] CATH LAB;  Service: Cardiovascular;  Laterality: N/A;  . LEFT HEART CATH AND CORONARY ANGIOGRAPHY N/A 02/26/2017   Procedure: Left Heart Cath and Coronary Angiography;  Surgeon: Sherren Mocha, MD;  Location: [place] CV LAB;  Service: Cardiovascular;  Laterality: N/A;  . LEFT HEART CATHETERIZATION WITH CORONARY ANGIOGRAM N/A 05/19/2013   Procedure: LEFT HEART CATHETERIZATION WITH CORONARY ANGIOGRAM;  Surgeon: [name], MD;  Location: [facility] CATH LAB;  Service: Cardiovascular;  Laterality: N/A;  . LUMBAR LAMINECTOMY/DECOMPRESSION MICRODISCECTOMY  08/30/2012   Procedure: LUMBAR LAMINECTOMY/DECOMPRESSION MICRODISCECTOMY 2  LEVELS;  Surgeon: [name], MD;  Location: [place] NEURO ORS;  Service: Neurosurgery;  Laterality: Bilateral;  Bilateral Lumbar three-four Lumbar four-five Laminotomies  . LUMBAR PERCUTANEOUS PEDICLE SCREW 1 LEVEL Bilateral 10/22/2017   Procedure: LUMBAR PERCUTANEOUS PEDICLE SCREW 1 LEVEL;  Surgeon: [name], MD;  Location: [place];  Service: Neurosurgery;  Laterality: Bilateral;  . LUMBAR SPINE SURGERY  04/07/2016   Dr. Ellene Route; "?ruptured disc"  . MELANOMA EXCISION Left 02/2011   forearm  . NASAL SINUS SURGERY  1970s   "cut windows in sinus pockets"  . PERCUTANEOUS CORONARY STENT INTERVENTION (PCI-S)  05/19/2013   Procedure: PERCUTANEOUS CORONARY STENT INTERVENTION (PCI-S);  Surgeon: [name], MD;  Location: [facility] CATH LAB;  Service: Cardiovascular;;  . ULNAR TUNNEL RELEASE Left 04/07/2016   Dr. Ellene Route    Family History  Problem Relation Age of Onset  . Cancer Mother        intestinal   . Heart disease Mother   . Cancer Father        prostate  . Migraines Daughter   . Leukemia Sister   . Heart disease Sister   . Prostate cancer Other   . Kidney cancer Other   . Cancer Other        Bladder cancer  . Coronary artery disease Other   . Hypertension Sister   . Hypertension Brother   . Heart attack Neg Hx   . Stroke Neg Hx     Allergies  Allergen Reactions  . Brilinta [Ticagrelor] Shortness Of Breath  . Levemir [Insulin Detemir] Swelling and Other (See Comments)    Patient had redness and swelling and tenderness at injection site.  . Codeine Other (See Comments)    Makes him "shakey", is OK with hydrocodone GI UPSET & TREMORS  . Lipitor [Atorvastatin] Other (See Comments)    Muscle aches; affects liver function- Patient is currently taking  . Novolog Mix [Insulin Aspart Prot & Aspart] Other (See Comments)    Causes skin to swell/itch at injection site    Current Outpatient Medications on File Prior to Visit  Medication Sig Dispense Refill  . acetaminophen (TYLENOL)  500 MG tablet Take 500 mg by mouth 2 (two) times daily.     Marland Kitchen  albuterol (PROVENTIL HFA;VENTOLIN HFA) 108 (90 Base) MCG/ACT inhaler Inhale 2 puffs into the lungs every 6 (six) hours as needed for wheezing or shortness of breath. 1 Inhaler 0  . allopurinol (ZYLOPRIM) 300 MG tablet Take 1 tablet (300 mg total) by mouth daily. 90 tablet 1  . aspirin EC 81 MG tablet Take 81 mg by mouth daily.    Marland Kitchen atorvastatin (LIPITOR) 40 MG tablet Take 1 tablet (40 mg total) by mouth daily. 90 tablet 1  . B Complex Vitamins (B COMPLEX-B12 PO) Take 1 tablet by mouth every 7 (seven) days.     . BD INSULIN SYRINGE ULTRAFINE 31G X 5/16" 1 ML MISC USE  TO INJECT  INSULIN  FIVE  TIMES  DAILY AS INSTRUCTED 450 each 3  . chlorpheniramine (CHLOR-TRIMETON) 4 MG tablet Take 4 mg by mouth daily.    . Cholecalciferol (VITAMIN D3) 25 MCG (1000 UT) CAPS Take 1,000 Units by mouth at bedtime.    Marland Kitchen doxycycline (VIBRA-TABS) 100 MG tablet Take 1 tablet (100 mg total) by mouth 2 (two) times daily. Can give caps or generic 20 tablet 0  . fenofibrate 160 MG tablet Take 1 tablet (160 mg total) by mouth at bedtime. 90 tablet 1  . fluocinonide cream (LIDEX) 0.98 % Apply 1 application topically daily as needed (for rashes).     . fluticasone (FLONASE) 50 MCG/ACT nasal spray Place 2 sprays into both nostrils daily. (Patient taking differently: Place 2 sprays into both nostrils daily as needed for allergies or rhinitis. ) 16 g 1  . fluticasone (FLONASE) 50 MCG/ACT nasal spray Place 2 sprays into both nostrils daily. 16 g 1  . furosemide (LASIX) 40 MG tablet TAKE 1 AND 1/2 TABLETS EVERY DAY 135 tablet 3  . glucose blood (TRUE METRIX BLOOD GLUCOSE TEST) test strip USE TO TEST BLOOD SUGAR THREE TIMES DAILY AS DIRECTED 300 each 3  . HYDROcodone-acetaminophen (NORCO) 10-325 MG tablet Take 0.5 tablets by mouth 2 (two) times daily.  0  . HYDROcodone-homatropine (HYCODAN) 5-1.5 MG/5ML syrup Take 5 mLs by mouth every 6 (six) hours as needed for cough. 150  mL 0  . insulin NPH Human (NOVOLIN N) 100 UNIT/ML injection Inject 45 units in am and 40 in the evening (Patient taking differently: Inject 45-50 Units into the skin See admin instructions. Inject 45 units into the skin before breakfast and 50 units before supper/evening meal) 30 mL 3  . insulin regular (NOVOLIN R RELION) 100 units/mL injection Inject 0.25-0.3 mLs (25-30 Units total) into the skin 3 (three) times daily before meals. (Patient taking differently: Inject 35 Units into the skin 3 (three) times daily before meals. ) 70 mL 3  . isosorbide mononitrate (IMDUR) 30 MG 24 hr tablet Take 1 tablet (30 mg total) by mouth daily. 90 tablet 3  . MAGNESIUM PO Take 1 tablet by mouth at bedtime.    . metoprolol tartrate (LOPRESSOR) 50 MG tablet Take 1 tablet (50 mg total) by mouth 2 (two) times daily. 180 tablet 1  . nitroGLYCERIN (NITROSTAT) 0.4 MG SL tablet Place 1 tablet (0.4 mg total) under the tongue every 5 (five) minutes as needed for chest pain. 100 tablet 3  . oxybutynin (DITROPAN) 5 MG tablet Take 1 tablet (5 mg total) by mouth 2 (two) times daily. 60 tablet 1  . pantoprazole (PROTONIX) 20 MG tablet Take 1 tablet (20 mg total) by mouth daily. (Patient taking differently: Take 20 mg by mouth at bedtime. ) 90 tablet 3  .  Probiotic Product (PROBIOTIC PO) Take 1 capsule by mouth at bedtime.     . sulfamethoxazole-trimethoprim (BACTRIM DS,SEPTRA DS) 800-160 MG tablet Take 1 tablet by mouth 2 (two) times daily. 20 tablet 0  . tamsulosin (FLOMAX) 0.4 MG CAPS capsule Take 1 capsule (0.4 mg total) by mouth daily. 90 capsule 1  . tiZANidine (ZANAFLEX) 4 MG tablet Take 4 mg by mouth 2 (two) times daily.  6   No current facility-administered medications on file prior to visit.     BP (!) 104/43   Pulse 63   Temp 98.1 F (36.7 C) (Oral)   Resp 16   Ht 6' (1.829 m)   Wt 256 lb (116.1 kg)   SpO2 99%   BMI 34.72 kg/m       Objective:   Physical Exam  General Mental Status- Alert. General  Appearance- Not in acute distress.   Skin General: Color- Normal Color. Moisture- Normal Moisture.  Neck Carotid Arteries- Normal color. Moisture- Normal Moisture. No carotid bruits. No JVD.  Chest and Lung Exam Auscultation: Breath Sounds:-Normal.  Cardiovascular Auscultation:Rythm- Regular. Murmurs & Other Heart Sounds:Auscultation of the heart reveals- No Murmurs.  Abdomen Inspection:-Inspeection Normal. Palpation/Percussion:Note:No mass. Palpation and Percussion of the abdomen reveal- Non Tender, Non Distended + BS, no rebound or guarding.    Neurologic Cranial Nerve exam:- CN III-XII intact(No nystagmus), symmetric smile. Strength:- 5/5 equal and symmetric strength both upper and lower extremities.      Assessment & Plan:  You have had a recent urinary tract infection.  Your symptoms have resolved for the most part.  Bactrim was on the sensitivity report but since you have some overall fatigue and report not feeling well we will repeat your urine culture.  We will presently give 3 more days of Bactrim pending the result.  For fatigue and recent history of UTI, will get CBC, CMP and lactic acid level.  Your blood pressure was low when medical assistant checked and low when I checked it.  Will get stat labs and make sure that your blood volume does not indicate to dehydration.  Also see if there is any indications of possible dehydration on the labs.  If labs look okay then will likely decrease metoprolol to 25 mg twice daily.  You had good neurologic exam today on exam however some dizziness when he stood up.  That dizziness was transient lasting for about 10 to 15 seconds.  Based on your overall presentation for caution sake hours discussing sending you down to the emergency department and getting a stat labs.  You declined that presently.  So we will do outpatient work-up but advised that she stay hydrated at home and make sure that you use your walker.  Might later advise  after reviewing labs to be seen at the emergency department.  Later if labs look good but you clinically worsen or symptoms change then recommend ED evaluation.  Follow-up date to be determined after lab review.  40 minutes spent with pt. 50% of time spent on counseling regarding plan/work up today. I light of fact he and wife did not want ED evaluation.   Mackie Pai, PA-C

## 2018-11-10 NOTE — Telephone Encounter (Signed)
Pt came in office stating got a bill for office visit on 11-15-2017 (Pt saw Debbrah Alar), pt is stating it was coded wrong and that pt never paid copay for our office nor has to pay with [facility] for his office visit. Pt stated called insurance and that they informed that Debbrah Alar was put in as specialist. Please verify billing issue and call pt to inform it issue is fixed and resubmitted. Please advise. Pt tel [phone]. (Copy of bill given to [place])

## 2018-11-10 NOTE — Patient Instructions (Addendum)
You have had a recent urinary tract infection.  Your symptoms have resolved for the most part.  Bactrim was on the sensitivity report but since you have some overall fatigue and report not feeling well we will repeat your urine culture.  We will presently give 3 more days of Bactrim pending the result.  For fatigue and recent history of UTI, will get CBC, CMP and lactic acid level.  Your blood pressure was low when medical assistant checked and low when I checked it.  Will get stat labs and make sure that your blood volume does not indicate to dehydration.  Also see if there is any indications of possible dehydration on the labs.  If labs look okay then will likely decrease metoprolol to 25 mg twice daily.  You had good neurologic exam today on exam however some dizziness when he stood up.  That dizziness was transient lasting for about 10 to 15 seconds.  Based on your overall presentation for caution sake hours discussing sending you down to the emergency department and getting a stat labs.  You declined that presently.  So we will do outpatient work-up but advised that she stay hydrated at home and make sure that you use your walker.  Might later advise after reviewing labs to be seen at the emergency department.  Later if labs look good but you clinically worsen or symptoms change then recommend ED evaluation.  Follow-up date to be determined after lab review.  Also remember if you get any profound constant dizziness would recommend you checking your blood sugars to see if you have any levels less than 70 or over 400.  Any sugars less than 70 then have appropriate snack to bring blood sugars up.  If sugars above 400 but then might need to advise emergency department depending on how you feel.

## 2018-11-10 NOTE — Telephone Encounter (Signed)
Future metabolic panel placed. 

## 2018-11-11 ENCOUNTER — Other Ambulatory Visit: Payer: Self-pay

## 2018-11-11 ENCOUNTER — Encounter (HOSPITAL_BASED_OUTPATIENT_CLINIC_OR_DEPARTMENT_OTHER): Payer: Self-pay | Admitting: Emergency Medicine

## 2018-11-11 ENCOUNTER — Observation Stay (HOSPITAL_BASED_OUTPATIENT_CLINIC_OR_DEPARTMENT_OTHER)
Admission: EM | Admit: 2018-11-11 | Discharge: 2018-11-12 | Disposition: A | Discharge status: Home / Self Care | Payer: Medicare HMO | Attending: Internal Medicine | Admitting: Internal Medicine

## 2018-11-11 DIAGNOSIS — N179 Acute kidney failure, unspecified: Principal | ICD-10-CM | POA: Insufficient documentation

## 2018-11-11 DIAGNOSIS — I5032 Chronic diastolic (congestive) heart failure: Secondary | ICD-10-CM | POA: Insufficient documentation

## 2018-11-11 DIAGNOSIS — Z7982 Long term (current) use of aspirin: Secondary | ICD-10-CM | POA: Diagnosis not present

## 2018-11-11 DIAGNOSIS — I130 Hypertensive heart and chronic kidney disease with heart failure and stage 1 through stage 4 chronic kidney disease, or unspecified chronic kidney disease: Secondary | ICD-10-CM | POA: Insufficient documentation

## 2018-11-11 DIAGNOSIS — E1122 Type 2 diabetes mellitus with diabetic chronic kidney disease: Secondary | ICD-10-CM | POA: Diagnosis not present

## 2018-11-11 DIAGNOSIS — E785 Hyperlipidemia, unspecified: Secondary | ICD-10-CM | POA: Insufficient documentation

## 2018-11-11 DIAGNOSIS — N183 Chronic kidney disease, stage 3 unspecified: Secondary | ICD-10-CM | POA: Diagnosis present

## 2018-11-11 DIAGNOSIS — I2510 Atherosclerotic heart disease of native coronary artery without angina pectoris: Secondary | ICD-10-CM | POA: Diagnosis not present

## 2018-11-11 DIAGNOSIS — I10 Essential (primary) hypertension: Secondary | ICD-10-CM | POA: Diagnosis present

## 2018-11-11 DIAGNOSIS — Z8249 Family history of ischemic heart disease and other diseases of the circulatory system: Secondary | ICD-10-CM | POA: Diagnosis not present

## 2018-11-11 DIAGNOSIS — G4733 Obstructive sleep apnea (adult) (pediatric): Secondary | ICD-10-CM | POA: Diagnosis not present

## 2018-11-11 DIAGNOSIS — I252 Old myocardial infarction: Secondary | ICD-10-CM | POA: Insufficient documentation

## 2018-11-11 DIAGNOSIS — Z87891 Personal history of nicotine dependence: Secondary | ICD-10-CM | POA: Insufficient documentation

## 2018-11-11 DIAGNOSIS — K219 Gastro-esophageal reflux disease without esophagitis: Secondary | ICD-10-CM | POA: Insufficient documentation

## 2018-11-11 DIAGNOSIS — Z888 Allergy status to other drugs, medicaments and biological substances status: Secondary | ICD-10-CM | POA: Diagnosis not present

## 2018-11-11 DIAGNOSIS — Z794 Long term (current) use of insulin: Secondary | ICD-10-CM | POA: Insufficient documentation

## 2018-11-11 DIAGNOSIS — Z885 Allergy status to narcotic agent status: Secondary | ICD-10-CM | POA: Diagnosis not present

## 2018-11-11 DIAGNOSIS — E1142 Type 2 diabetes mellitus with diabetic polyneuropathy: Secondary | ICD-10-CM | POA: Diagnosis not present

## 2018-11-11 LAB — CBC WITH DIFFERENTIAL/PLATELET
Abs Immature Granulocytes: 0.02 10*3/uL (ref 0.00–0.07)
Basophils Absolute: 0 10*3/uL (ref 0.0–0.1)
Basophils Relative: 1 %
Eosinophils Absolute: 0.2 10*3/uL (ref 0.0–0.5)
Eosinophils Relative: 4 %
HCT: 39.3 % (ref 39.0–52.0)
Hemoglobin: 12.7 g/dL — ABNORMAL LOW (ref 13.0–17.0)
Immature Granulocytes: 0 %
Lymphocytes Relative: 38 %
Lymphs Abs: 2.4 10*3/uL (ref 0.7–4.0)
MCH: 30.7 pg (ref 26.0–34.0)
MCHC: 32.3 g/dL (ref 30.0–36.0)
MCV: 94.9 fL (ref 80.0–100.0)
Monocytes Absolute: 0.4 10*3/uL (ref 0.1–1.0)
Monocytes Relative: 7 %
Neutro Abs: 3.2 10*3/uL (ref 1.7–7.7)
Neutrophils Relative %: 50 %
Platelets: 175 10*3/uL (ref 150–400)
RBC: 4.14 MIL/uL — AB (ref 4.22–5.81)
RDW: 14.6 % (ref 11.5–15.5)
WBC: 6.3 10*3/uL (ref 4.0–10.5)
nRBC: 0 % (ref 0.0–0.2)

## 2018-11-11 LAB — URINE CULTURE
MICRO NUMBER:: 127801
Result:: NO GROWTH
SPECIMEN QUALITY:: ADEQUATE

## 2018-11-11 LAB — URINALYSIS, ROUTINE W REFLEX MICROSCOPIC
BILIRUBIN URINE: NEGATIVE
Glucose, UA: 100 mg/dL — AB
Hgb urine dipstick: NEGATIVE
Ketones, ur: NEGATIVE mg/dL
Leukocytes, UA: NEGATIVE
Nitrite: NEGATIVE
Protein, ur: NEGATIVE mg/dL
Specific Gravity, Urine: 1.015 (ref 1.005–1.030)
pH: 6 (ref 5.0–8.0)

## 2018-11-11 LAB — LACTIC ACID, PLASMA: LACTIC ACID: 2.2 mmol/L — ABNORMAL HIGH (ref 0.4–1.8)

## 2018-11-11 MED ORDER — SODIUM CHLORIDE 0.9 % IV BOLUS
500.0000 mL | Freq: Once | INTRAVENOUS | Status: AC
  Administered 2018-11-11: 500 mL via INTRAVENOUS

## 2018-11-11 NOTE — ED Provider Notes (Signed)
[facility] EMERGENCY DEPARTMENT Provider Note   CSN: [id number] Arrival date & time: 11/11/18  2242     History   Chief Complaint Chief Complaint  Patient presents with  . Abnormal Lab    HPI William Hendrix is a 78 y.o. male.  Patient received a phone call from his primary care office tonight around 10 PM that told him to go to the ER.  Patient was seen in the office yesterday for follow-up of a urinary tract infection.  He had blood work drawn and was called tonight and told his lactic acid was elevated.     Past Medical History:  Diagnosis Date  . AAA (abdominal aortic aneurysm) ([place]) 12/15/2014   a. Mild aneurysmal dilatation of the infrarenal abdominal aorta 3.2 cm - f/u due by 2019.  . Arthritis    "all over" (07/30/2016)  . CAD (coronary artery disease)    a. stent to LAD 2000. b. possible spasm by cath 2001. c. IVUS/PTCA/DES to mLAD 05/2013. d. PTCA/DES of dRCA into ostial rPDA 07/2013. e. PTCA of OM2, DES to Cx, DES to [facility] 07/2016.  Marland Kitchen Chronic bronchitis ([place])   . Chronic diastolic CHF (congestive heart failure) ([place])   . Chronic lower back pain   . Chronic neck pain   . CKD (chronic kidney disease), stage III ([place])   . Diabetic peripheral neuropathy ([place])   . Ejection fraction    55%, 07/2010, mild inferior hypo  . GERD (gastroesophageal reflux disease)   . Gout   . H/O diverticulitis of colon 01/31/2015  . H/O hiatal hernia   . History of blood transfusion ~ 10/1940   "had pneumonia"  . History of kidney stones   . HTN (hypertension)   . Hyperlipidemia   . Melanoma of forearm, left ([place]) 02/2011   with wide excision   . Myocardial infarction ([place])   . Nephrolithiasis   . Obesity   . OSA on CPAP     [name] since 2000  . Peripheral neuropathy    both feet  . Positive D-dimer    a. significant elevation ,hospital 07/2010, etiology unclear. b. D Dimer chronically > 20.  Marland Kitchen Renal insufficiency   . Skin cancer   . Spinal stenosis    a. s/p  surgical repair 2013  . Spinal stenosis of lumbar region   . Type II diabetes mellitus ([place])   . Ventral hernia     Patient Active Problem List   Diagnosis Date Noted  . Acute renal failure superimposed on stage 3 chronic kidney disease ([place]) 11/12/2018  . Overactive bladder 10/31/2018  . Acute diastolic CHF (congestive heart failure) ([place])   . Chest pain 09/15/2018  . UTI (urinary tract infection) 08/01/2018  . Muscle cramps 06/13/2018  . Urinary incontinence 05/01/2018  . Anemia 11/07/2017  . Constipation 11/07/2017  . Lumbar stenosis with neurogenic claudication 10/22/2017  . Back pain 08/29/2017  . Right-sided low back pain with right-sided sciatica 11/22/2016  . Pressure injury of skin 08/03/2016  . Dyspnea 08/01/2016  . NSTEMI (non-ST elevated myocardial infarction) ([place]) 07/30/2016  . CAD in native artery   . Obesity   . Uncontrolled type 2 diabetes mellitus with circulatory disorder, with long-term current use of insulin ([place]) 11/28/2015  . Lumbar radiculopathy 10/09/2015  . Neuropathy, diabetic ([place]) 05/22/2015  . H/O diverticulitis of colon 01/31/2015  . AAA (abdominal aortic aneurysm) ([place]) 01/01/2015  . Sinusitis 10/10/2014  . Right hip pain 07/05/2014  . Palpitations 02/27/2014  .  Claudication of lower extremity ([place]) 10/25/2013  . BPH with obstruction/lower urinary tract symptoms 10/25/2013  . Hypertriglyceridemia 08/15/2012  . Melanoma ([place])   . Dizziness   . Renal insufficiency   . Coronary artery disease with exertional angina ([place])   . Hyperlipidemia   . Essential hypertension   . OSA (obstructive sleep apnea)   . Ejection fraction   . SOB (shortness of breath)   . PARESTHESIA 05/30/2009  . EDEMA 01/22/2009  . OTHER ABNORMAL BLOOD CHEMISTRY 04/25/2008  . Gout 10/21/2007  . Depression 10/21/2007  . VENTRAL HERNIA 10/21/2007  . HIATAL HERNIA 10/21/2007  . FATIGUE 10/21/2007  . SKIN CANCER, HX OF 10/21/2007  . NEPHROLITHIASIS, HX OF 10/21/2007     Past Surgical History:  Procedure Laterality Date  . ANTERIOR LAT LUMBAR FUSION  10/22/2017   Procedure: Lumbar two-three Lumbar three-four Lumbar four-five Anteriolateral lumbar interbody fusion with percutaneous pedicle screw fixation and infuse;  Surgeon: [name], MD;  Location: [facility];  Service: Neurosurgery;;  . APPLICATION OF ROBOTIC ASSISTANCE FOR SPINAL PROCEDURE  10/22/2017   Procedure: APPLICATION OF ROBOTIC ASSISTANCE FOR SPINAL PROCEDURE;  Surgeon: [name], MD;  Location: [facility];  Service: Neurosurgery;;  . BACK SURGERY    . CARDIAC CATHETERIZATION N/A 07/30/2016   Procedure: Left Heart Cath and Coronary Angiography;  Surgeon: Nelva Bush, MD;  Location: [facility] CV LAB;  Service: Cardiovascular;  Laterality: N/A;  . CARDIAC CATHETERIZATION N/A 07/30/2016   Procedure: Coronary Stent Intervention;  Surgeon: Nelva Bush, MD;  Location: [facility] CV LAB;  Service: Cardiovascular;  Laterality: N/A;  Mid CFX and MID RCA  . CARDIAC CATHETERIZATION N/A 07/30/2016   Procedure: Coronary Balloon Angioplasty;  Surgeon: Nelva Bush, MD;  Location: [facility] CV LAB;  Service: Cardiovascular;  Laterality: N/A;  OM 1  . CARPAL TUNNEL RELEASE Left   . CATARACT EXTRACTION W/ INTRAOCULAR LENS  IMPLANT, BILATERAL Bilateral ~ 2014  . CERVICAL DISC SURGERY  1990s  . CORONARY ANGIOPLASTY WITH STENT PLACEMENT  05/2013  . CORONARY ANGIOPLASTY WITH STENT PLACEMENT  07/26/2013   DES to RCA extending to PDA    . CORONARY ANGIOPLASTY WITH STENT PLACEMENT  07/30/2016  . CORONARY ANGIOPLASTY WITH STENT PLACEMENT  2000   CAD  . DENTAL SURGERY  04/2016   "got infected; had to dig it out"  . EYE SURGERY    . KNEE ARTHROSCOPY Right 1990's   rt  . LEFT AND RIGHT HEART CATHETERIZATION WITH CORONARY ANGIOGRAM N/A 07/26/2013   Procedure: LEFT AND RIGHT HEART CATHETERIZATION WITH CORONARY ANGIOGRAM;  Surgeon: [name], MD;  Location: [facility] CATH LAB;  Service: Cardiovascular;   Laterality: N/A;  . LEFT HEART CATH AND CORONARY ANGIOGRAPHY N/A 02/26/2017   Procedure: Left Heart Cath and Coronary Angiography;  Surgeon: Sherren Mocha, MD;  Location: [facility] CV LAB;  Service: Cardiovascular;  Laterality: N/A;  . LEFT HEART CATHETERIZATION WITH CORONARY ANGIOGRAM N/A 05/19/2013   Procedure: LEFT HEART CATHETERIZATION WITH CORONARY ANGIOGRAM;  Surgeon: [name], MD;  Location: [facility] CATH LAB;  Service: Cardiovascular;  Laterality: N/A;  . LUMBAR LAMINECTOMY/DECOMPRESSION MICRODISCECTOMY  08/30/2012   Procedure: LUMBAR LAMINECTOMY/DECOMPRESSION MICRODISCECTOMY 2 LEVELS;  Surgeon: [name], MD;  Location: [facility] NEURO ORS;  Service: Neurosurgery;  Laterality: Bilateral;  Bilateral Lumbar three-four Lumbar four-five Laminotomies  . LUMBAR PERCUTANEOUS PEDICLE SCREW 1 LEVEL Bilateral 10/22/2017   Procedure: LUMBAR PERCUTANEOUS PEDICLE SCREW 1 LEVEL;  Surgeon: [name], MD;  Location: [facility];  Service: Neurosurgery;  Laterality:  Bilateral;  . LUMBAR SPINE SURGERY  04/07/2016   Dr. Ellene Route; "?ruptured disc"  . MELANOMA EXCISION Left 02/2011   forearm  . NASAL SINUS SURGERY  1970s   "cut windows in sinus pockets"  . PERCUTANEOUS CORONARY STENT INTERVENTION (PCI-S)  05/19/2013   Procedure: PERCUTANEOUS CORONARY STENT INTERVENTION (PCI-S);  Surgeon: [name], MD;  Location: [facility] CATH LAB;  Service: Cardiovascular;;  . ULNAR TUNNEL RELEASE Left 04/07/2016   Dr. Ellene Route        Home Medications    Prior to Admission medications   Medication Sig Start Date End Date Taking? Authorizing Provider  acetaminophen (TYLENOL) 500 MG tablet Take 500 mg by mouth 2 (two) times daily.     [provider]  albuterol (PROVENTIL HFA;VENTOLIN HFA) 108 (90 Base) MCG/ACT inhaler Inhale 2 puffs into the lungs every 6 (six) hours as needed for wheezing or shortness of breath. 10/30/16   Saguier, Percell Miller, PA-C  allopurinol (ZYLOPRIM) 300 MG tablet Take 1 tablet (300 mg total) by  mouth daily. 08/01/18   [name], MD  aspirin EC 81 MG tablet Take 81 mg by mouth daily. 02/23/11   [name], MD  atorvastatin (LIPITOR) 40 MG tablet Take 1 tablet (40 mg total) by mouth daily. 08/01/18   [name], MD  B Complex Vitamins (B COMPLEX-B12 PO) Take 1 tablet by mouth every 7 (seven) days.  07/23/16   [provider]  BD INSULIN SYRINGE ULTRAFINE 31G X 5/16" 1 ML MISC USE  TO INJECT  INSULIN  FIVE  TIMES  DAILY AS INSTRUCTED 03/17/17   [name], MD  chlorpheniramine (CHLOR-TRIMETON) 4 MG tablet Take 4 mg by mouth daily.    [provider]  Cholecalciferol (VITAMIN D3) 25 MCG (1000 UT) CAPS Take 1,000 Units by mouth at bedtime.    [provider]  doxycycline (VIBRA-TABS) 100 MG tablet Take 1 tablet (100 mg total) by mouth 2 (two) times daily. Can give caps or generic 10/13/18   Saguier, Percell Miller, PA-C  fenofibrate 160 MG tablet Take 1 tablet (160 mg total) by mouth at bedtime. 08/01/18   [name], MD  fluocinonide cream (LIDEX) 3.57 % Apply 1 application topically daily as needed (for rashes).  08/11/12   [provider]  fluticasone (FLONASE) 50 MCG/ACT nasal spray Place 2 sprays into both nostrils daily. Patient taking differently: Place 2 sprays into both nostrils daily as needed for allergies or rhinitis.  08/11/17   Saguier, Percell Miller, PA-C  fluticasone (FLONASE) 50 MCG/ACT nasal spray Place 2 sprays into both nostrils daily. 10/13/18   Saguier, Percell Miller, PA-C  furosemide (LASIX) 40 MG tablet TAKE 1 AND 1/2 TABLETS EVERY DAY 09/26/18   Lelon Perla, MD  glucose blood (TRUE METRIX BLOOD GLUCOSE TEST) test strip USE TO TEST BLOOD SUGAR THREE TIMES DAILY AS DIRECTED 08/01/18   [name], MD  HYDROcodone-acetaminophen (NORCO) 10-325 MG tablet Take 0.5 tablets by mouth 2 (two) times daily. 08/10/18   [provider]  HYDROcodone-homatropine (HYCODAN) 5-1.5 MG/5ML syrup Take 5 mLs by mouth every 6 (six) hours  as needed for cough. 10/28/18   [name], MD  insulin NPH Human (NOVOLIN N) 100 UNIT/ML injection Inject 45 units in am and 40 in the evening Patient taking differently: Inject 45-50 Units into the skin See admin instructions. Inject 45 units into the skin before breakfast and 50 units before supper/evening meal 05/09/18   [name], MD  insulin regular (NOVOLIN R  RELION) 100 units/mL injection Inject 0.25-0.3 mLs (25-30 Units total) into the skin 3 (three) times daily before meals. Patient taking differently: Inject 35 Units into the skin 3 (three) times daily before meals.  09/17/17   [name], MD  isosorbide mononitrate (IMDUR) 30 MG 24 hr tablet Take 1 tablet (30 mg total) by mouth daily. 09/26/18   Lelon Perla, MD  MAGNESIUM PO Take 1 tablet by mouth at bedtime.    [provider]  metoprolol tartrate (LOPRESSOR) 50 MG tablet Take 1 tablet (50 mg total) by mouth 2 (two) times daily. 10/28/18   [name], MD  nitroGLYCERIN (NITROSTAT) 0.4 MG SL tablet Place 1 tablet (0.4 mg total) under the tongue every 5 (five) minutes as needed for chest pain. 02/16/18   Lelon Perla, MD  oxybutynin (DITROPAN) 5 MG tablet Take 1 tablet (5 mg total) by mouth 2 (two) times daily. 10/28/18   [name], MD  pantoprazole (PROTONIX) 20 MG tablet Take 1 tablet (20 mg total) by mouth daily. Patient taking differently: Take 20 mg by mouth at bedtime.  08/01/18   [name], MD  Probiotic Product (PROBIOTIC PO) Take 1 capsule by mouth at bedtime.     [provider]  sulfamethoxazole-trimethoprim (BACTRIM DS,SEPTRA DS) 800-160 MG tablet Take 1 tablet by mouth 2 (two) times daily. 11/02/18   Saguier, Percell Miller, PA-C  sulfamethoxazole-trimethoprim (BACTRIM DS,SEPTRA DS) 800-160 MG tablet Take 1 tablet by mouth 2 (two) times daily. 11/10/18   Saguier, Percell Miller, PA-C  tamsulosin (FLOMAX) 0.4 MG CAPS capsule Take 1 capsule (0.4 mg total) by mouth daily. 10/28/18   [name], MD  tiZANidine (ZANAFLEX) 4 MG tablet Take 4 mg by mouth 2 (two) times daily. 08/12/18   [provider]    Family History Family History  Problem Relation Age of Onset  . Cancer Mother        intestinal   . Heart disease Mother   . Cancer Father        prostate  . Migraines Daughter   . Leukemia Sister   . Heart disease Sister   . Prostate cancer Other   . Kidney cancer Other   . Cancer Other        Bladder cancer  . Coronary artery disease Other   . Hypertension Sister   . Hypertension Brother   . Heart attack Neg Hx   . Stroke Neg Hx     Social History Social History   Tobacco Use  . Smoking status: Former Smoker    Packs/day: 3.00    Years: 30.00    Pack years: 90.00    Types: Cigarettes    Last attempt to quit: 09/17/1996    Years since quitting: 22.1  . Smokeless tobacco: Never Used  Substance Use Topics  . Alcohol use: No  . Drug use: No     Allergies   Brilinta [ticagrelor]; Levemir [insulin detemir]; Codeine; Lipitor [atorvastatin]; and Novolog mix [insulin aspart prot & aspart]   Review of Systems Review of Systems  Constitutional: Positive for fatigue.  All other systems reviewed and are negative.    Physical Exam Updated Vital Signs BP 134/64 (BP Location: Right Arm)   Pulse 72   Temp 98.3 F (36.8 C) (Oral)   Resp 20   Ht 6' (1.829 m)   Wt 116.1 kg   SpO2 96%   BMI 34.72 kg/m   Physical Exam Vitals signs and nursing note reviewed.  Constitutional:      General: He is not in acute distress.    Appearance: Normal appearance. He is well-developed.  HENT:     Head: Normocephalic and atraumatic.     Right Ear: Hearing normal.     Left Ear: Hearing normal.     Nose: Nose normal.  Eyes:     Conjunctiva/sclera: Conjunctivae normal.     Pupils: Pupils are equal, round, and reactive to light.  Neck:     Musculoskeletal: Normal range of motion and neck supple.  Cardiovascular:     Rate and Rhythm: Regular rhythm.       Heart sounds: S1 normal and S2 normal. No murmur. No friction rub. No gallop.   Pulmonary:     Effort: Pulmonary effort is normal. No respiratory distress.     Breath sounds: Normal breath sounds.  Chest:     Chest wall: No tenderness.  Abdominal:     General: Bowel sounds are normal.     Palpations: Abdomen is soft.     Tenderness: There is no abdominal tenderness. There is no guarding or rebound. Negative signs include Murphy's sign and McBurney's sign.     Hernia: No hernia is present.  Musculoskeletal: Normal range of motion.  Skin:    General: Skin is warm and dry.     Findings: No rash.  Neurological:     Mental Status: He is alert and oriented to person, place, and time.     GCS: GCS eye subscore is 4. GCS verbal subscore is 5. GCS motor subscore is 6.     Cranial Nerves: No cranial nerve deficit.     Sensory: No sensory deficit.     Coordination: Coordination normal.  Psychiatric:        Speech: Speech normal.        Behavior: Behavior normal.        Thought Content: Thought content normal.      ED Treatments / Results  Labs (all labs ordered are listed, but only abnormal results are displayed) Labs Reviewed  CBC WITH DIFFERENTIAL/PLATELET - Abnormal; Notable for the following components:      Result Value   RBC 4.14 (*)    Hemoglobin 12.7 (*)    All other components within normal limits  BASIC METABOLIC PANEL - Abnormal; Notable for the following components:   Glucose, Bld 269 (*)    BUN 32 (*)    Creatinine, Ser 2.21 (*)    GFR calc non Af Amer 28 (*)    GFR calc Af Amer 32 (*)    All other components within normal limits  URINALYSIS, ROUTINE W REFLEX MICROSCOPIC - Abnormal; Notable for the following components:   Glucose, UA 100 (*)    All other components within normal limits  LACTIC ACID, PLASMA    EKG None  Radiology No results found.  Procedures Procedures (including critical care time)  Medications Ordered in ED Medications  sodium  chloride 0.9 % bolus 1,000 mL (1,000 mLs Intravenous New Bag/Given 11/12/18 0219)    Followed by  0.9 %  sodium chloride infusion (has no administration in time range)  sodium chloride 0.9 % bolus 500 mL (0 mLs Intravenous Stopped 11/12/18 0018)     Initial Impression / Assessment and Plan / ED Course  I have reviewed the triage vital signs and the nursing notes.  Pertinent labs & imaging results that were available during my care of the patient were reviewed by me and considered in my  medical decision making (see chart for details).     Patient presents to the emergency department for evaluation of abnormal labs.  Patient was seen by his doctor 1 day previously for follow-up after treatment for urinary tract infection.  At that presentation the patient was feeling weak and was found to be hypotensive and orthostatic.  Patient declined transfer to the ER at that point, primary doctor did some blood work.  He was called tonight and told his lactic acid was elevated.  Patient appears well tonight.  He is not hypotensive.  He is not febrile.  He has a normal white blood cell count and his lactic acid is normal.  Urinalysis is entirely normal, no sign of persistent infection.  He does, however, have evidence of an acute kidney injury.  This was likely the cause of his potential and the other day as well.  He likely has dehydration.  He has been given fluid boluses here in the ER and will be admitted for acute kidney injury.  Final Clinical Impressions(s) / ED Diagnoses   Final diagnoses:  AKI (acute kidney injury) [facility])    ED Discharge Orders    None       , Gwenyth Allegra, MD 11/12/18 [phone]

## 2018-11-11 NOTE — ED Triage Notes (Signed)
Per wife pt's PCP called around 21:30 requesting for him to go to the ED due to having elevated lactic acid on the office and possible urosepsis.

## 2018-11-12 ENCOUNTER — Encounter (HOSPITAL_COMMUNITY): Payer: Self-pay | Admitting: Emergency Medicine

## 2018-11-12 DIAGNOSIS — I130 Hypertensive heart and chronic kidney disease with heart failure and stage 1 through stage 4 chronic kidney disease, or unspecified chronic kidney disease: Secondary | ICD-10-CM | POA: Diagnosis not present

## 2018-11-12 DIAGNOSIS — E1142 Type 2 diabetes mellitus with diabetic polyneuropathy: Secondary | ICD-10-CM | POA: Diagnosis not present

## 2018-11-12 DIAGNOSIS — G4733 Obstructive sleep apnea (adult) (pediatric): Secondary | ICD-10-CM | POA: Diagnosis not present

## 2018-11-12 DIAGNOSIS — I10 Essential (primary) hypertension: Secondary | ICD-10-CM

## 2018-11-12 DIAGNOSIS — Z885 Allergy status to narcotic agent status: Secondary | ICD-10-CM | POA: Diagnosis not present

## 2018-11-12 DIAGNOSIS — N183 Chronic kidney disease, stage 3 unspecified: Secondary | ICD-10-CM | POA: Diagnosis present

## 2018-11-12 DIAGNOSIS — I252 Old myocardial infarction: Secondary | ICD-10-CM | POA: Diagnosis not present

## 2018-11-12 DIAGNOSIS — N179 Acute kidney failure, unspecified: Secondary | ICD-10-CM | POA: Diagnosis not present

## 2018-11-12 DIAGNOSIS — E785 Hyperlipidemia, unspecified: Secondary | ICD-10-CM | POA: Diagnosis not present

## 2018-11-12 DIAGNOSIS — Z794 Long term (current) use of insulin: Secondary | ICD-10-CM | POA: Diagnosis not present

## 2018-11-12 DIAGNOSIS — E1122 Type 2 diabetes mellitus with diabetic chronic kidney disease: Secondary | ICD-10-CM | POA: Diagnosis not present

## 2018-11-12 DIAGNOSIS — Z87891 Personal history of nicotine dependence: Secondary | ICD-10-CM | POA: Diagnosis not present

## 2018-11-12 DIAGNOSIS — I2510 Atherosclerotic heart disease of native coronary artery without angina pectoris: Secondary | ICD-10-CM | POA: Diagnosis not present

## 2018-11-12 DIAGNOSIS — Z7982 Long term (current) use of aspirin: Secondary | ICD-10-CM | POA: Diagnosis not present

## 2018-11-12 DIAGNOSIS — I5032 Chronic diastolic (congestive) heart failure: Secondary | ICD-10-CM | POA: Diagnosis present

## 2018-11-12 DIAGNOSIS — K219 Gastro-esophageal reflux disease without esophagitis: Secondary | ICD-10-CM | POA: Diagnosis not present

## 2018-11-12 DIAGNOSIS — Z8249 Family history of ischemic heart disease and other diseases of the circulatory system: Secondary | ICD-10-CM | POA: Diagnosis not present

## 2018-11-12 DIAGNOSIS — Z888 Allergy status to other drugs, medicaments and biological substances status: Secondary | ICD-10-CM | POA: Diagnosis not present

## 2018-11-12 LAB — BASIC METABOLIC PANEL
Anion gap: 8 (ref 5–15)
Anion gap: 9 (ref 5–15)
BUN: 27 mg/dL — ABNORMAL HIGH (ref 8–23)
BUN: 32 mg/dL — ABNORMAL HIGH (ref 8–23)
CO2: 23 mmol/L (ref 22–32)
CO2: 25 mmol/L (ref 22–32)
Calcium: 9.7 mg/dL (ref 8.9–10.3)
Calcium: 9.7 mg/dL (ref 8.9–10.3)
Chloride: 103 mmol/L (ref 98–111)
Chloride: 109 mmol/L (ref 98–111)
Creatinine, Ser: 1.89 mg/dL — ABNORMAL HIGH (ref 0.61–1.24)
Creatinine, Ser: 2.21 mg/dL — ABNORMAL HIGH (ref 0.61–1.24)
GFR calc Af Amer: 39 mL/min — ABNORMAL LOW (ref >60)
GFR calc non Af Amer: 33 mL/min — ABNORMAL LOW (ref >60)
GFR, EST AFRICAN AMERICAN: 32 mL/min — AB (ref >60)
GFR, EST NON AFRICAN AMERICAN: 28 mL/min — AB (ref >60)
Glucose, Bld: 100 mg/dL — ABNORMAL HIGH (ref 70–99)
Glucose, Bld: 269 mg/dL — ABNORMAL HIGH (ref 70–99)
Potassium: 4.1 mmol/L (ref 3.5–5.1)
Potassium: 4.5 mmol/L (ref 3.5–5.1)
SODIUM: 137 mmol/L (ref 135–145)
Sodium: 140 mmol/L (ref 135–145)

## 2018-11-12 LAB — GLUCOSE, CAPILLARY
GLUCOSE-CAPILLARY: 86 mg/dL (ref 70–99)
Glucose-Capillary: 67 mg/dL — ABNORMAL LOW (ref 70–99)
Glucose-Capillary: 126 mg/dL — ABNORMAL HIGH (ref 70–99)

## 2018-11-12 LAB — LACTIC ACID, PLASMA: Lactic Acid, Venous: 1.7 mmol/L (ref 0.5–1.9)

## 2018-11-12 MED ORDER — INSULIN NPH (HUMAN) (ISOPHANE) 100 UNIT/ML ~~LOC~~ SUSP: 45.0000 [IU] | SUBCUTANEOUS | Status: DC

## 2018-11-12 MED ORDER — SODIUM CHLORIDE 0.9 % IV SOLN
Freq: Once | INTRAVENOUS | Status: AC
  Administered 2018-11-12: 03:00:00 via INTRAVENOUS

## 2018-11-12 MED ORDER — SODIUM CHLORIDE 0.9 % IV SOLN: 250.0000 mL | INTRAVENOUS | Status: DC | PRN

## 2018-11-12 MED ORDER — SODIUM CHLORIDE 0.9% FLUSH
3.0000 mL | Freq: Two times a day (BID) | INTRAVENOUS | Status: DC
  Administered 2018-11-12: 3 mL via INTRAVENOUS

## 2018-11-12 MED ORDER — INSULIN NPH (HUMAN) (ISOPHANE) 100 UNIT/ML ~~LOC~~ SUSP
45.0000 [IU] | Freq: Every day | SUBCUTANEOUS | Status: DC
  Filled 2018-11-12: qty 10

## 2018-11-12 MED ORDER — INSULIN NPH (HUMAN) (ISOPHANE) 100 UNIT/ML ~~LOC~~ SUSP
50.0000 [IU] | Freq: Every day | SUBCUTANEOUS | Status: DC
  Filled 2018-11-12: qty 10

## 2018-11-12 MED ORDER — ASPIRIN EC 81 MG PO TBEC
81.0000 mg | DELAYED_RELEASE_TABLET | Freq: Every day | ORAL | Status: DC
  Administered 2018-11-12: 81 mg via ORAL
  Filled 2018-11-12: qty 1

## 2018-11-12 MED ORDER — SODIUM CHLORIDE 0.9 % IV BOLUS
1000.0000 mL | Freq: Once | INTRAVENOUS | Status: AC
  Administered 2018-11-12: 1000 mL via INTRAVENOUS

## 2018-11-12 MED ORDER — INSULIN NPH (HUMAN) (ISOPHANE) 100 UNIT/ML ~~LOC~~ SUSP
45.0000 [IU] | Freq: Every day | SUBCUTANEOUS | Status: DC
  Administered 2018-11-12: 45 [IU] via SUBCUTANEOUS
  Filled 2018-11-12: qty 10

## 2018-11-12 MED ORDER — PANTOPRAZOLE SODIUM 20 MG PO TBEC
20.0000 mg | DELAYED_RELEASE_TABLET | Freq: Every day | ORAL | Status: DC
  Filled 2018-11-12: qty 1

## 2018-11-12 MED ORDER — SODIUM CHLORIDE 0.9% FLUSH: 3.0000 mL | INTRAVENOUS | Status: DC | PRN

## 2018-11-12 MED ORDER — METOPROLOL TARTRATE 50 MG PO TABS: 25.0000 mg | ORAL_TABLET | Freq: Two times a day (BID) | ORAL | Status: DC

## 2018-11-12 MED ORDER — ALBUTEROL SULFATE (2.5 MG/3ML) 0.083% IN NEBU: 2.5000 mg | INHALATION_SOLUTION | Freq: Four times a day (QID) | RESPIRATORY_TRACT | Status: DC | PRN

## 2018-11-12 MED ORDER — TAMSULOSIN HCL 0.4 MG PO CAPS
0.4000 mg | ORAL_CAPSULE | Freq: Every day | ORAL | Status: DC
  Administered 2018-11-12: 0.4 mg via ORAL
  Filled 2018-11-12: qty 1

## 2018-11-12 MED ORDER — ALLOPURINOL 300 MG PO TABS
300.0000 mg | ORAL_TABLET | Freq: Every day | ORAL | Status: DC
  Administered 2018-11-12: 300 mg via ORAL
  Filled 2018-11-12: qty 1

## 2018-11-12 MED ORDER — FUROSEMIDE 40 MG PO TABS: ORAL_TABLET | ORAL | 3 refills | Status: DC

## 2018-11-12 NOTE — H&P (Signed)
History and Physical    William Hendrix WFU:[id number] DOB: 21-Aug-1941 DOA: 11/11/2018  PCP: [name], MD  Patient coming from: Home  Chief Complaint: Abnormal labs  HPI: JAKELL TRUSTY is a 78 y.o. male with medical history significant of recent treated UTI with Bactrim, chronic kidney disease with a baseline creatinine of around 1.6, chronic diastolic congestive heart failure sent here by PCP for abnormal labs.  Patient had labs apparently checked yesterday with a bump in his creatinine of up to 2.  Patient sent to the emergency room to be evaluated for admission.  He has been given 3 L of IV fluids.  Patient denies any fevers nausea vomiting or diarrhea.  He reports his appetite has been normal.  Patient did have initial urine culture done on January 17 that showed entero bacteria and then a repeat culture was done on January 30 which shows no growth to date.  He also had a lactic acid level on January 30 that had a level of 2.2.  Patient's lactic acid level today prior to receiving any IV fluids was 1.6.  Patient is being referred for admission for acute kidney injury on chronic kidney disease.  Review of Systems: As per HPI otherwise 10 point review of systems negative.   Past Medical History:  Diagnosis Date  . AAA (abdominal aortic aneurysm) ([place]) 12/15/2014   a. Mild aneurysmal dilatation of the infrarenal abdominal aorta 3.2 cm - f/u due by 2019.  . Arthritis    "all over" (07/30/2016)  . CAD (coronary artery disease)    a. stent to LAD 2000. b. possible spasm by cath 2001. c. IVUS/PTCA/DES to mLAD 05/2013. d. PTCA/DES of dRCA into ostial rPDA 07/2013. e. PTCA of OM2, DES to Cx, DES to [facility] 07/2016.  Marland Kitchen Chronic bronchitis ([place])   . Chronic diastolic CHF (congestive heart failure) ([place])   . Chronic lower back pain   . Chronic neck pain   . CKD (chronic kidney disease), stage III ([place])   . Diabetic peripheral neuropathy ([place])   . Ejection fraction    55%, 07/2010, mild  inferior hypo  . GERD (gastroesophageal reflux disease)   . Gout   . H/O diverticulitis of colon 01/31/2015  . H/O hiatal hernia   . History of blood transfusion ~ 10/1940   "had pneumonia"  . History of kidney stones   . HTN (hypertension)   . Hyperlipidemia   . Melanoma of forearm, left ([place]) 02/2011   with wide excision   . Myocardial infarction ([place])   . Nephrolithiasis   . Obesity   . OSA on CPAP     [name] since 2000  . Peripheral neuropathy    both feet  . Positive D-dimer    a. significant elevation ,hospital 07/2010, etiology unclear. b. D Dimer chronically > 20.  Marland Kitchen Renal insufficiency   . Skin cancer   . Spinal stenosis    a. s/p surgical repair 2013  . Spinal stenosis of lumbar region   . Type II diabetes mellitus ([place])   . Ventral hernia     Past Surgical History:  Procedure Laterality Date  . ANTERIOR LAT LUMBAR FUSION  10/22/2017   Procedure: Lumbar two-three Lumbar three-four Lumbar four-five Anteriolateral lumbar interbody fusion with percutaneous pedicle screw fixation and infuse;  Surgeon: [name], MD;  Location: [place];  Service: Neurosurgery;;  . APPLICATION OF ROBOTIC ASSISTANCE FOR SPINAL PROCEDURE  10/22/2017   Procedure: APPLICATION OF ROBOTIC ASSISTANCE FOR SPINAL  PROCEDURE;  Surgeon: [name], MD;  Location: [place];  Service: Neurosurgery;;  . BACK SURGERY    . CARDIAC CATHETERIZATION N/A 07/30/2016   Procedure: Left Heart Cath and Coronary Angiography;  Surgeon: Nelva Bush, MD;  Location: [place] CV LAB;  Service: Cardiovascular;  Laterality: N/A;  . CARDIAC CATHETERIZATION N/A 07/30/2016   Procedure: Coronary Stent Intervention;  Surgeon: Nelva Bush, MD;  Location: [place] CV LAB;  Service: Cardiovascular;  Laterality: N/A;  Mid CFX and MID RCA  . CARDIAC CATHETERIZATION N/A 07/30/2016   Procedure: Coronary Balloon Angioplasty;  Surgeon: Nelva Bush, MD;  Location: [place] CV LAB;  Service: Cardiovascular;   Laterality: N/A;  OM 1  . CARPAL TUNNEL RELEASE Left   . CATARACT EXTRACTION W/ INTRAOCULAR LENS  IMPLANT, BILATERAL Bilateral ~ 2014  . CERVICAL DISC SURGERY  1990s  . CORONARY ANGIOPLASTY WITH STENT PLACEMENT  05/2013  . CORONARY ANGIOPLASTY WITH STENT PLACEMENT  07/26/2013   DES to RCA extending to PDA    . CORONARY ANGIOPLASTY WITH STENT PLACEMENT  07/30/2016  . CORONARY ANGIOPLASTY WITH STENT PLACEMENT  2000   CAD  . DENTAL SURGERY  04/2016   "got infected; had to dig it out"  . EYE SURGERY    . KNEE ARTHROSCOPY Right 1990's   rt  . LEFT AND RIGHT HEART CATHETERIZATION WITH CORONARY ANGIOGRAM N/A 07/26/2013   Procedure: LEFT AND RIGHT HEART CATHETERIZATION WITH CORONARY ANGIOGRAM;  Surgeon: [name], MD;  Location: [place] CATH LAB;  Service: Cardiovascular;  Laterality: N/A;  . LEFT HEART CATH AND CORONARY ANGIOGRAPHY N/A 02/26/2017   Procedure: Left Heart Cath and Coronary Angiography;  Surgeon: Sherren Mocha, MD;  Location: [place] CV LAB;  Service: Cardiovascular;  Laterality: N/A;  . LEFT HEART CATHETERIZATION WITH CORONARY ANGIOGRAM N/A 05/19/2013   Procedure: LEFT HEART CATHETERIZATION WITH CORONARY ANGIOGRAM;  Surgeon: [name], MD;  Location: [facility] CATH LAB;  Service: Cardiovascular;  Laterality: N/A;  . LUMBAR LAMINECTOMY/DECOMPRESSION MICRODISCECTOMY  08/30/2012   Procedure: LUMBAR LAMINECTOMY/DECOMPRESSION MICRODISCECTOMY 2 LEVELS;  Surgeon: [name], MD;  Location: [place] NEURO ORS;  Service: Neurosurgery;  Laterality: Bilateral;  Bilateral Lumbar three-four Lumbar four-five Laminotomies  . LUMBAR PERCUTANEOUS PEDICLE SCREW 1 LEVEL Bilateral 10/22/2017   Procedure: LUMBAR PERCUTANEOUS PEDICLE SCREW 1 LEVEL;  Surgeon: [name], MD;  Location: [place];  Service: Neurosurgery;  Laterality: Bilateral;  . LUMBAR SPINE SURGERY  04/07/2016   Dr. Ellene Route; "?ruptured disc"  . MELANOMA EXCISION Left 02/2011   forearm  . NASAL SINUS SURGERY  1970s   "cut windows in  sinus pockets"  . PERCUTANEOUS CORONARY STENT INTERVENTION (PCI-S)  05/19/2013   Procedure: PERCUTANEOUS CORONARY STENT INTERVENTION (PCI-S);  Surgeon: [name], MD;  Location: [facility] CATH LAB;  Service: Cardiovascular;;  . ULNAR TUNNEL RELEASE Left 04/07/2016   Dr. Ellene Route     reports that he quit smoking about 22 years ago. His smoking use included cigarettes. He has a 90.00 pack-year smoking history. He has never used smokeless tobacco. He reports that he does not drink alcohol or use drugs.  Allergies  Allergen Reactions  . Brilinta [Ticagrelor] Shortness Of Breath  . Levemir [Insulin Detemir] Swelling and Other (See Comments)    Patient had redness and swelling and tenderness at injection site.  . Codeine Other (See Comments)    Makes him "shakey", is OK with hydrocodone GI UPSET & TREMORS  . Lipitor [Atorvastatin] Other (See Comments)    Muscle aches; affects liver function-  Patient is currently taking  . Novolog Mix [Insulin Aspart Prot & Aspart] Other (See Comments)    Causes skin to swell/itch at injection site    Family History  Problem Relation Age of Onset  . Cancer Mother        intestinal   . Heart disease Mother   . Cancer Father        prostate  . Migraines Daughter   . Leukemia Sister   . Heart disease Sister   . Prostate cancer Other   . Kidney cancer Other   . Cancer Other        Bladder cancer  . Coronary artery disease Other   . Hypertension Sister   . Hypertension Brother   . Heart attack Neg Hx   . Stroke Neg Hx     Prior to Admission medications   Medication Sig Start Date End Date Taking? Authorizing Provider  acetaminophen (TYLENOL) 500 MG tablet Take 500 mg by mouth 2 (two) times daily.     [provider]  albuterol (PROVENTIL HFA;VENTOLIN HFA) 108 (90 Base) MCG/ACT inhaler Inhale 2 puffs into the lungs every 6 (six) hours as needed for wheezing or shortness of breath. 10/30/16   Saguier, Percell Miller, PA-C  allopurinol (ZYLOPRIM) 300 MG  tablet Take 1 tablet (300 mg total) by mouth daily. 08/01/18   [name], MD  aspirin EC 81 MG tablet Take 81 mg by mouth daily. 02/23/11   [name], MD  atorvastatin (LIPITOR) 40 MG tablet Take 1 tablet (40 mg total) by mouth daily. 08/01/18   [name], MD  B Complex Vitamins (B COMPLEX-B12 PO) Take 1 tablet by mouth every 7 (seven) days.  07/23/16   [provider]  BD INSULIN SYRINGE ULTRAFINE 31G X 5/16" 1 ML MISC USE  TO INJECT  INSULIN  FIVE  TIMES  DAILY AS INSTRUCTED 03/17/17   [name], MD  chlorpheniramine (CHLOR-TRIMETON) 4 MG tablet Take 4 mg by mouth daily.    [provider]  Cholecalciferol (VITAMIN D3) 25 MCG (1000 UT) CAPS Take 1,000 Units by mouth at bedtime.    [provider]  doxycycline (VIBRA-TABS) 100 MG tablet Take 1 tablet (100 mg total) by mouth 2 (two) times daily. Can give caps or generic 10/13/18   Saguier, Percell Miller, PA-C  fenofibrate 160 MG tablet Take 1 tablet (160 mg total) by mouth at bedtime. 08/01/18   [name], MD  fluocinonide cream (LIDEX) 6.29 % Apply 1 application topically daily as needed (for rashes).  08/11/12   [provider]  fluticasone (FLONASE) 50 MCG/ACT nasal spray Place 2 sprays into both nostrils daily. Patient taking differently: Place 2 sprays into both nostrils daily as needed for allergies or rhinitis.  08/11/17   Saguier, Percell Miller, PA-C  fluticasone (FLONASE) 50 MCG/ACT nasal spray Place 2 sprays into both nostrils daily. 10/13/18   Saguier, Percell Miller, PA-C  furosemide (LASIX) 40 MG tablet TAKE 1 AND 1/2 TABLETS EVERY DAY 09/26/18   Lelon Perla, MD  glucose blood (TRUE METRIX BLOOD GLUCOSE TEST) test strip USE TO TEST BLOOD SUGAR THREE TIMES DAILY AS DIRECTED 08/01/18   [name], MD  HYDROcodone-acetaminophen (NORCO) 10-325 MG tablet Take 0.5 tablets by mouth 2 (two) times daily. 08/10/18   [provider]  HYDROcodone-homatropine (HYCODAN) 5-1.5 MG/5ML syrup  Take 5 mLs by mouth every 6 (six) hours as needed for cough. 10/28/18   [name], MD  insulin NPH Human (NOVOLIN  N) 100 UNIT/ML injection Inject 45 units in am and 40 in the evening Patient taking differently: Inject 45-50 Units into the skin See admin instructions. Inject 45 units into the skin before breakfast and 50 units before supper/evening meal 05/09/18   [name], MD  insulin regular (NOVOLIN R RELION) 100 units/mL injection Inject 0.25-0.3 mLs (25-30 Units total) into the skin 3 (three) times daily before meals. Patient taking differently: Inject 35 Units into the skin 3 (three) times daily before meals.  09/17/17   [name], MD  isosorbide mononitrate (IMDUR) 30 MG 24 hr tablet Take 1 tablet (30 mg total) by mouth daily. 09/26/18   Lelon Perla, MD  MAGNESIUM PO Take 1 tablet by mouth at bedtime.    [provider]  metoprolol tartrate (LOPRESSOR) 50 MG tablet Take 1 tablet (50 mg total) by mouth 2 (two) times daily. 10/28/18   [name], MD  nitroGLYCERIN (NITROSTAT) 0.4 MG SL tablet Place 1 tablet (0.4 mg total) under the tongue every 5 (five) minutes as needed for chest pain. 02/16/18   Lelon Perla, MD  oxybutynin (DITROPAN) 5 MG tablet Take 1 tablet (5 mg total) by mouth 2 (two) times daily. 10/28/18   [name], MD  pantoprazole (PROTONIX) 20 MG tablet Take 1 tablet (20 mg total) by mouth daily. Patient taking differently: Take 20 mg by mouth at bedtime.  08/01/18   [name], MD  Probiotic Product (PROBIOTIC PO) Take 1 capsule by mouth at bedtime.     [provider]  sulfamethoxazole-trimethoprim (BACTRIM DS,SEPTRA DS) 800-160 MG tablet Take 1 tablet by mouth 2 (two) times daily. 11/02/18   Saguier, Percell Miller, PA-C  sulfamethoxazole-trimethoprim (BACTRIM DS,SEPTRA DS) 800-160 MG tablet Take 1 tablet by mouth 2 (two) times daily. 11/10/18   Saguier, Percell Miller, PA-C  tamsulosin (FLOMAX) 0.4 MG CAPS capsule Take 1 capsule (0.4 mg  total) by mouth daily. 10/28/18   [name], MD  tiZANidine (ZANAFLEX) 4 MG tablet Take 4 mg by mouth 2 (two) times daily. 08/12/18   [provider]    Physical Exam: Vitals:   11/11/18 2248 11/12/18 0138 11/12/18 0326 11/12/18 0525  BP:  134/64 (!) 136/59 140/64  Pulse:  72 68 60  Resp:  20  20  Temp:    97.7 F (36.5 C)  TempSrc:    Oral  SpO2:  96% 97% 97%  Weight: 116.1 kg     Height: 6' (1.829 m)         Constitutional: NAD, calm, comfortable Vitals:   11/11/18 2248 11/12/18 0138 11/12/18 0326 11/12/18 0525  BP:  134/64 (!) 136/59 140/64  Pulse:  72 68 60  Resp:  20  20  Temp:    97.7 F (36.5 C)  TempSrc:    Oral  SpO2:  96% 97% 97%  Weight: 116.1 kg     Height: 6' (1.829 m)      Eyes: PERRL, lids and conjunctivae normal ENMT: Mucous membranes are moist. Posterior pharynx clear of any exudate or lesions.Normal dentition.  Neck: normal, supple, no masses, no thyromegaly Respiratory: clear to auscultation bilaterally, no wheezing, no crackles. Normal respiratory effort. No accessory muscle use.  Cardiovascular: Regular rate and rhythm, no murmurs / rubs / gallops. No extremity edema. 2+ pedal pulses. No carotid bruits.  Abdomen: no tenderness, no masses palpated. No hepatosplenomegaly. Bowel sounds positive.  Musculoskeletal: no clubbing / cyanosis. No joint deformity upper and lower extremities. Good ROM, no  contractures. Normal muscle tone.  Skin: no rashes, lesions, ulcers. No induration Neurologic: CN 2-12 grossly intact. Sensation intact, DTR normal. Strength 5/5 in all 4.  Psychiatric: Normal judgment and insight. Alert and oriented x 3. Normal mood.    Labs on Admission: I have personally reviewed following labs and imaging studies  CBC: Recent Labs  Lab 11/10/18 1210 11/11/18 2333  WBC 5.2 6.3  NEUTROABS 2.8 3.2  HGB 12.8* 12.7*  HCT 38.8* 39.3  MCV 93.7 94.9  PLT 200.0 762   Basic Metabolic Panel: Recent Labs  Lab 11/10/18 1210  11/11/18 2333  NA 140 137  K 4.8 4.5  CL 103 103  CO2 30 25  GLUCOSE 263* 269*  BUN 29* 32*  CREATININE 1.99* 2.21*  CALCIUM 10.5 9.7   GFR: Estimated Creatinine Clearance: 36.2 mL/min (A) (by C-G formula based on SCr of 2.21 mg/dL (H)). Liver Function Tests: Recent Labs  Lab 11/10/18 1210  AST 19  ALT 18  ALKPHOS 54  BILITOT 0.4  PROT 7.0  ALBUMIN 4.1   No results for input(s): LIPASE, AMYLASE in the last 168 hours. No results for input(s): AMMONIA in the last 168 hours. Coagulation Profile: No results for input(s): INR, PROTIME in the last 168 hours. Cardiac Enzymes: No results for input(s): CKTOTAL, CKMB, CKMBINDEX, TROPONINI in the last 168 hours. BNP (last 3 results) Recent Labs    10/13/18 1241 11/02/18 0959  PROBNP 26.0 53.0   HbA1C: No results for input(s): HGBA1C in the last 72 hours. CBG: No results for input(s): GLUCAP in the last 168 hours. Lipid Profile: No results for input(s): CHOL, HDL, LDLCALC, TRIG, CHOLHDL, LDLDIRECT in the last 72 hours. Thyroid Function Tests: No results for input(s): TSH, T4TOTAL, FREET4, T3FREE, THYROIDAB in the last 72 hours. Anemia Panel: No results for input(s): VITAMINB12, FOLATE, FERRITIN, TIBC, IRON, RETICCTPCT in the last 72 hours. Urine analysis:    Component Value Date/Time   COLORURINE YELLOW 11/11/2018 2339   APPEARANCEUR CLEAR 11/11/2018 2339   LABSPEC 1.015 11/11/2018 2339   PHURINE 6.0 11/11/2018 2339   GLUCOSEU 100 (A) 11/11/2018 2339   GLUCOSEU NEGATIVE 08/29/2018 1148   [place] 11/11/2018 2339   BILIRUBINUR NEGATIVE 11/11/2018 2339   BILIRUBINUR NEG 11/10/2018 [place] 11/11/2018 2339   PROTEINUR NEGATIVE 11/11/2018 2339   UROBILINOGEN negative (A) 11/10/2018 1218   UROBILINOGEN 0.2 08/29/2018 1148   NITRITE NEGATIVE 11/11/2018 2339   LEUKOCYTESUR NEGATIVE 11/11/2018 2339   Sepsis Labs:  !!!!!!!!!!!!!!!!!!!!!!!!!!!!!!!!!!!!!!!!!!!! @LABRCNTIP (procalcitonin:4,lacticidven:4) ) Recent Results (from the past 240 hour(s))  Urine Culture     Status: Abnormal   Collection Time: 11/02/18  9:59 AM  Result Value Ref Range Status   MICRO NUMBER: [id number]  Final   SPECIMEN QUALITY: Adequate  Final   Sample Source NOT GIVEN  Final   STATUS: FINAL  Final   ISOLATE 1: Enterobacter cloacae complex (A)  Final    Comment: Greater than 100,000 CFU/mL of Enterobacter cloacae complex      Susceptibility   Enterobacter cloacae complex - URINE CULTURE, REFLEX    AMOX/CLAVULANIC >=32 Resistant     CEFAZOLIN* >=64 Resistant      * For uncomplicated UTI caused by E. coli,K. pneumoniae or P. mirabilis: Cefazolin issusceptible if MIC <32 mcg/mL and predictssusceptible to the oral agents cefaclor, cefdinir,cefpodoxime, cefprozil, cefuroxime, cephalexinand loracarbef.    CEFEPIME 2 Sensitive     CEFTRIAXONE >=64 Resistant     CIPROFLOXACIN <=0.25 Sensitive     LEVOFLOXACIN <=0.12  Sensitive     ERTAPENEM <=0.5 Sensitive     GENTAMICIN <=1 Sensitive     IMIPENEM <=0.25 Sensitive     NITROFURANTOIN 64 Intermediate     PIP/TAZO >=128 Resistant     TOBRAMYCIN <=1 Sensitive     TRIMETH/SULFA* <=20 Sensitive      * For uncomplicated UTI caused by E. coli,K. pneumoniae or P. mirabilis: Cefazolin issusceptible if MIC <32 mcg/mL and predictssusceptible to the oral agents cefaclor, cefdinir,cefpodoxime, cefprozil, cefuroxime, cephalexinand loracarbef.Legend:S = Susceptible  I = IntermediateR = Resistant  NS = Not susceptible* = Not tested  NR = Not reported**NN = See antimicrobic comments  Urine Culture     Status: None   Collection Time: 11/10/18 12:10 PM  Result Value Ref Range Status   MICRO NUMBER: [id number]  Final   SPECIMEN QUALITY: Adequate  Final   Sample Source NOT GIVEN  Final   STATUS: FINAL  Final   Result: No Growth  Final     Radiological Exams on Admission: No results found.  Old  chart reviewed   Assessment/Plan 78 year old male with acute kidney injury mild on top of chronic kidney disease Principal Problem:   Acute renal failure superimposed on stage 3 chronic kidney disease (HCC)-repeat BMP right now.  Mild bump in his creatinine up to 2.2 from 1.5.  Hold nephrotoxic substances.  Urinalysis is clear at this time.  Recent culture negative. In the setting of normal white count and no fever stop antibiotics.  Active Problems:   Essential hypertension-clarify home meds   Chronic diastolic CHF (congestive heart failure) (HCC)-holding all diuretics at this time   Med rec pending pharmacy review  DVT prophylaxis: SCDs Code Status: Full Family Communication: None Disposition Plan: A day Consults called: None Admission status: Observation   Ashika Apuzzo A MD [facility]  If 7PM-7AM, please contact night-coverage [email or web address] Password [facility]  11/12/2018, 5:40 AM

## 2018-11-12 NOTE — ED Notes (Signed)
Attempted to call report. RN to call back. 

## 2018-11-12 NOTE — ED Notes (Signed)
[facility] notified (Tammy) - patient ready for transport 

## 2018-11-12 NOTE — Progress Notes (Signed)
Pt ambulated well 

## 2018-11-12 NOTE — ED Notes (Signed)
Gave report to [place], Karen Chafe

## 2018-11-12 NOTE — Discharge Summary (Signed)
Physician Discharge Summary  William Hendrix NAT:[id number] DOB: [birth year] DOA: 11/11/2018  PCP: [name], MD  Admit date: 11/11/2018 Discharge date: 11/12/2018  Admitted From: home Discharge disposition: home   Recommendations for Outpatient Follow-Up:   1. BMP Monday-- may need adjustment of lasix 2. Bowel regimen to avoid constipation 3. Patient to discuss changing allergy medications as for > 1 year has been having "chills" at night on occasion     Discharge Diagnosis:   Principal Problem:   Acute renal failure superimposed on stage 3 chronic kidney disease ([place]) Active Problems:   Essential hypertension   Chronic diastolic CHF (congestive heart failure) (HCC)   AKI (acute kidney injury) ([place])    Discharge Condition: Improved.  Diet recommendation: Low sodium, heart healthy.  Carbohydrate-modified.  Wound care: None.  Code status: Full.   History of Present Illness:  William Hendrix is a 78 y.o. male with medical history significant of recent treated UTI with Bactrim, chronic kidney disease with a baseline creatinine of around 1.6, chronic diastolic congestive heart failure sent here by PCP for abnormal labs.  Patient had labs apparently checked yesterday with a bump in his creatinine of up to 2.  Patient sent to the emergency room to be evaluated for admission.  He has been given 3 L of IV fluids.  Patient denies any fevers nausea vomiting or diarrhea.  He reports his appetite has been normal.  Patient did have initial urine culture done on January 17 that showed entero bacteria and then a repeat culture was done on January 30 which shows no growth to date.  He also had a lactic acid level on January 30 that had a level of 2.2.  Patient's lactic acid level today prior to receiving any IV fluids was 1.6.  Patient is being referred for admission for acute kidney injury on chronic kidney disease.   Hospital Course by Problem:   Mild Acute renal failure  superimposed on stage 3 chronic kidney disease (HCC) - Urinalysis is clear at this time.  Recent culture negative. -In the setting of normal white count and no fever stop antibiotics. -may need adjustment of lasix-- have held until Monday when he gets a BMP drawn with PCP -PVR around 130 -encourage bowel regimen to avoid constipation causing issues emptying his bladder    Essential hypertension -resume home meds -avoid hypotension    Chronic diastolic CHF (congestive heart failure) ([place]) -resume lasix on Monday -defer to PCP but may need lower dose  DM type 2 -adjust insulin based on blood sugars -encouraged compliance with diet    Medical Consultants:      Discharge Exam:   Vitals:   Vitals:   11/12/18 0326 11/12/18 0525 11/12/18 1043   BP: (!) 136/59 140/64    Pulse: 68 60    Resp:  20    Temp:  97.7 F (36.5 C)    TempSrc:  Oral    SpO2: 97% 97%    Weight:  120.9 kg 120.5 kg   Height:  6' (1.829 m)      General exam: Appears calm and comfortable.  Says he feels like his normal self  The results of significant diagnostics from this hospitalization (including imaging, microbiology, ancillary and laboratory) are listed below for reference.     Procedures and Diagnostic Studies:   No results found.   Labs:   Basic Metabolic Panel: Recent Labs  Lab 11/10/18 1210 11/11/18 2333 11/12/18 0702  NA  140 137 140  K 4.8 4.5 4.1  CL 103 103 109  CO2 30 25 23   GLUCOSE 263* 269* 100*  BUN 29* 32* 27*  CREATININE 1.99* 2.21* 1.89*  CALCIUM 10.5 9.7 9.7   GFR Estimated Creatinine Clearance: 43.2 mL/min (A) (by C-G formula based on SCr of 1.89 mg/dL (H)). Liver Function Tests: Recent Labs  Lab 11/10/18 1210  AST 19  ALT 18  ALKPHOS 54  BILITOT 0.4  PROT 7.0  ALBUMIN 4.1   No results for input(s): LIPASE, AMYLASE in the last 168 hours. No results for input(s): AMMONIA in the last 168 hours. Coagulation profile No results for input(s): INR,  PROTIME in the last 168 hours.  CBC: Recent Labs  Lab 11/10/18 1210 11/11/18 2333  WBC 5.2 6.3  NEUTROABS 2.8 3.2  HGB 12.8* 12.7*  HCT 38.8* 39.3  MCV 93.7 94.9  PLT 200.0 175   Cardiac Enzymes: No results for input(s): CKTOTAL, CKMB, CKMBINDEX, TROPONINI in the last 168 hours. BNP: Invalid input(s): POCBNP CBG: Recent Labs  Lab 11/12/18 0800 11/12/18 0830 11/12/18 1142  GLUCAP 67* 86 126*   D-Dimer No results for input(s): DDIMER in the last 72 hours. Hgb A1c No results for input(s): HGBA1C in the last 72 hours. Lipid Profile No results for input(s): CHOL, HDL, LDLCALC, TRIG, CHOLHDL, LDLDIRECT in the last 72 hours. Thyroid function studies No results for input(s): TSH, T4TOTAL, T3FREE, THYROIDAB in the last 72 hours.  Invalid input(s): FREET3 Anemia work up No results for input(s): VITAMINB12, FOLATE, FERRITIN, TIBC, IRON, RETICCTPCT in the last 72 hours. Microbiology Recent Results (from the past 240 hour(s))  Urine Culture     Status: None   Collection Time: 11/10/18 12:10 PM  Result Value Ref Range Status   MICRO NUMBER: [id number]  Final   SPECIMEN QUALITY: Adequate  Final   Sample Source NOT GIVEN  Final   STATUS: FINAL  Final   Result: No Growth  Final     Discharge Instructions:   Discharge Instructions    Diet - low sodium heart healthy   Complete by:  As directed    Discharge instructions   Complete by:  As directed    Restart lasix on Monday BMP on Wednesday to check you kidney function Bring log of blood sugars to PCP- -may need adjustment of insulin if blood sugars running low   Increase activity slowly   Complete by:  As directed      Allergies as of 11/12/2018      Reactions   Brilinta [ticagrelor] Shortness Of Breath   Levemir [insulin Detemir] Swelling, Other (See Comments)   Patient had redness and swelling and tenderness at injection site.   Codeine Other (See Comments)   Makes him "shakey", is OK with hydrocodone GI UPSET &  TREMORS   Lipitor [atorvastatin] Other (See Comments)   Muscle aches; affects liver function- Patient is currently taking   Novolog Mix [insulin Aspart Prot & Aspart] Other (See Comments)   Causes skin to swell/itch at injection site      Medication List    STOP taking these medications   chlorpheniramine 4 MG tablet Commonly known as:  CHLOR-TRIMETON   doxycycline 100 MG tablet Commonly known as:  VIBRA-TABS   HYDROcodone-homatropine 5-1.5 MG/5ML syrup Commonly known as:  HYCODAN   sulfamethoxazole-trimethoprim 800-160 MG tablet Commonly known as:  BACTRIM DS,SEPTRA DS     TAKE these medications   acetaminophen 500 MG tablet Commonly known as:  TYLENOL Take  500 mg by mouth 2 (two) times daily.   albuterol 108 (90 Base) MCG/ACT inhaler Commonly known as:  PROVENTIL HFA;VENTOLIN HFA Inhale 2 puffs into the lungs every 6 (six) hours as needed for wheezing or shortness of breath.   allopurinol 300 MG tablet Commonly known as:  ZYLOPRIM Take 1 tablet (300 mg total) by mouth daily.   aspirin EC 81 MG tablet Take 81 mg by mouth daily.   atorvastatin 40 MG tablet Commonly known as:  LIPITOR Take 1 tablet (40 mg total) by mouth daily.   B COMPLEX-B12 PO Take 1 tablet by mouth every 7 (seven) days.   BD INSULIN SYRINGE ULTRAFINE 31G X 5/16" 1 ML Misc Generic drug:  Insulin Syringe-Needle U-100 USE  TO INJECT  INSULIN  FIVE  TIMES  DAILY AS INSTRUCTED   fenofibrate 160 MG tablet Take 1 tablet (160 mg total) by mouth at bedtime.   fluocinonide cream 0.05 % Commonly known as:  LIDEX Apply 1 application topically daily as needed (for rashes).   fluticasone 50 MCG/ACT nasal spray Commonly known as:  FLONASE Place 2 sprays into both nostrils daily. What changed:    when to take this  reasons to take this  Another medication with the same name was removed. Continue taking this medication, and follow the directions you see here.   furosemide 40 MG tablet Commonly  known as:  LASIX TAKE 1 AND 1/2 TABLETS EVERY DAY Start taking on:  November 14, 2018 What changed:  These instructions start on November 14, 2018. If you are unsure what to do until then, ask your doctor or other care provider.   glucose blood test strip Commonly known as:  TRUE METRIX BLOOD GLUCOSE TEST USE TO TEST BLOOD SUGAR THREE TIMES DAILY AS DIRECTED   HYDROcodone-acetaminophen 10-325 MG tablet Commonly known as:  NORCO Take 0.5 tablets by mouth 2 (two) times daily.   insulin NPH Human 100 UNIT/ML injection Commonly known as:  NOVOLIN N Inject 45 units in am and 40 in the evening What changed:    how much to take  how to take this  when to take this  additional instructions   insulin regular 100 units/mL injection Commonly known as:  NOVOLIN R RELION Inject 0.25-0.3 mLs (25-30 Units total) into the skin 3 (three) times daily before meals. What changed:  how much to take   isosorbide mononitrate 30 MG 24 hr tablet Commonly known as:  IMDUR Take 1 tablet (30 mg total) by mouth daily.   MAGNESIUM PO Take 1 tablet by mouth at bedtime.   metoprolol tartrate 50 MG tablet Commonly known as:  LOPRESSOR Take 0.5 tablets (25 mg total) by mouth 2 (two) times daily. What changed:  how much to take   nitroGLYCERIN 0.4 MG SL tablet Commonly known as:  NITROSTAT Place 1 tablet (0.4 mg total) under the tongue every 5 (five) minutes as needed for chest pain.   oxybutynin 5 MG tablet Commonly known as:  DITROPAN Take 1 tablet (5 mg total) by mouth 2 (two) times daily.   pantoprazole 20 MG tablet Commonly known as:  PROTONIX Take 1 tablet (20 mg total) by mouth daily. What changed:  when to take this   PROBIOTIC PO Take 1 capsule by mouth at bedtime.   tamsulosin 0.4 MG Caps capsule Commonly known as:  FLOMAX Take 1 capsule (0.4 mg total) by mouth daily.   tiZANidine 4 MG tablet Commonly known as:  ZANAFLEX Take 4 mg by  mouth 2 (two) times daily.   Vitamin D3 25  MCG (1000 UT) Caps Take 1,000 Units by mouth at bedtime.      Follow-up Information    [name], MD Follow up in 1 week(s).   Specialty:  Family Medicine Why:  with BMP Contact information: [address] [place] [address] [phone]            Time coordinating discharge: 35 min  Signed:  Geradine Girt DO  [facility] 11/12/2018, 3:33 PM

## 2018-11-12 NOTE — ED Notes (Signed)
ED Provider at bedside. 

## 2018-11-14 ENCOUNTER — Telehealth: Payer: Self-pay | Admitting: Medical

## 2018-11-14 ENCOUNTER — Other Ambulatory Visit (INDEPENDENT_AMBULATORY_CARE_PROVIDER_SITE_OTHER): Payer: Medicare HMO

## 2018-11-14 ENCOUNTER — Telehealth: Payer: Self-pay | Admitting: *Deleted

## 2018-11-14 DIAGNOSIS — N19 Unspecified kidney failure: Secondary | ICD-10-CM

## 2018-11-14 DIAGNOSIS — R944 Abnormal results of kidney function studies: Secondary | ICD-10-CM | POA: Diagnosis not present

## 2018-11-14 LAB — COMPREHENSIVE METABOLIC PANEL
ALT: 23 U/L (ref 0–53)
AST: 25 U/L (ref 0–37)
Albumin: 4.4 g/dL (ref 3.5–5.2)
Alkaline Phosphatase: 56 U/L (ref 39–117)
BUN: 31 mg/dL — AB (ref 6–23)
CHLORIDE: 102 meq/L (ref 96–112)
CO2: 28 mEq/L (ref 19–32)
Calcium: 10.5 mg/dL (ref 8.4–10.5)
Creatinine, Ser: 1.9 mg/dL — ABNORMAL HIGH (ref 0.40–1.50)
GFR: 34.4 mL/min — ABNORMAL LOW (ref >60.00)
Glucose, Bld: 107 mg/dL — ABNORMAL HIGH (ref 70–99)
Potassium: 4.6 mEq/L (ref 3.5–5.1)
Sodium: 137 mEq/L (ref 135–145)
Total Bilirubin: 0.5 mg/dL (ref 0.2–1.2)
Total Protein: 7.1 g/dL (ref 6.0–8.3)

## 2018-11-14 NOTE — Telephone Encounter (Signed)
Pt is here today for lab work and pt stated that needed to recheck B/P today-to make any change with the blood pressure meds. B/P- 108/57 .Pt is waiting in the room by the lab. Please advise

## 2018-11-14 NOTE — Telephone Encounter (Signed)
Referral to nephrologist placed. 

## 2018-11-14 NOTE — Telephone Encounter (Signed)
Pt was unavailable. Spoke with wife- okay per DPR.  Transition Care Management Follow-up Telephone Call   Date discharged? 11/12/2018   How have you been since you were released from the hospital? "he is doing well"   Do you understand why you were in the hospital? yes   Do you understand the discharge instructions? yes   Where were you discharged to? Home with wife   Items Reviewed:  Medications reviewed: no, she does not have list on hand  Allergies reviewed: no, she does not have list on hand  Dietary changes reviewed: yes  Referrals reviewed: yes   Functional Questionnaire:   Activities of Daily Living (ADLs):   He states they are independent in the following: ambulation, bathing and hygiene, feeding, continence, grooming, toileting and dressing States they require assistance with the following: na   Any transportation issues/concerns?: no   Any patient concerns? no   Confirmed importance and date/time of follow-up visits scheduled yes. Pt is coming in for labs today @130  and wife states that pt will make hospital follow up appt while in the office  Confirmed with patient if condition begins to worsen call PCP or go to the ER.  Patient was given the office number and encouraged to call back with question or concerns.  : yes

## 2018-11-14 NOTE — Telephone Encounter (Signed)
Reviewed pt labs and sent my chart message.

## 2018-11-16 ENCOUNTER — Ambulatory Visit (INDEPENDENT_AMBULATORY_CARE_PROVIDER_SITE_OTHER): Payer: Medicare HMO | Admitting: Pulmonary Disease

## 2018-11-16 ENCOUNTER — Encounter: Payer: Self-pay | Admitting: Pulmonary Disease

## 2018-11-16 VITALS — BP 142/80 | HR 81 | Ht 70.5 in | Wt 253.3 lb

## 2018-11-16 DIAGNOSIS — G4733 Obstructive sleep apnea (adult) (pediatric): Secondary | ICD-10-CM

## 2018-11-16 DIAGNOSIS — Z9989 Dependence on other enabling machines and devices: Secondary | ICD-10-CM

## 2018-11-16 NOTE — Patient Instructions (Signed)
History of obstructive sleep apnea Compliant with treatment  We will set you up with a home sleep study We will contact your medical supply company after the study to get your new machine  I will see you back in the office in about 3 months Call with any significant concerns

## 2018-11-16 NOTE — Addendum Note (Signed)
Addended by: Madolyn Frieze on: 11/16/2018 03:54 PM   Modules accepted: Orders

## 2018-11-16 NOTE — Progress Notes (Signed)
William Hendrix    [id number]    1941/05/03  Primary Care Physician:Blyth, Bonnita Levan, MD  Referring Physician: [name], MD [address] [place], [place] [address]  Chief complaint:   Patient with a history of obstructive sleep apnea  HPI:  History of obstructive sleep apnea, compliant with treatment Machine is getting dated Sleeps well Has no concerns about treatment for sleep disordered breathing at present His machine does not have a smart card-not able to monitor compliance Usually goes to bed by midnight Variable awakening time in the morning Wakes up a couple of times during the night  No dry throat,  Recent hospitalization for decompensated congestive heart failure Recent hospitalization for UTI  Outpatient Encounter Medications as of 11/16/2018  Medication Sig  . acetaminophen (TYLENOL) 500 MG tablet Take 500 mg by mouth 2 (two) times daily.   Marland Kitchen albuterol (PROVENTIL HFA;VENTOLIN HFA) 108 (90 Base) MCG/ACT inhaler Inhale 2 puffs into the lungs every 6 (six) hours as needed for wheezing or shortness of breath.  . allopurinol (ZYLOPRIM) 300 MG tablet Take 1 tablet (300 mg total) by mouth daily.  Marland Kitchen aspirin EC 81 MG tablet Take 81 mg by mouth daily.  Marland Kitchen atorvastatin (LIPITOR) 40 MG tablet Take 1 tablet (40 mg total) by mouth daily.  . B Complex Vitamins (B COMPLEX-B12 PO) Take 1 tablet by mouth every 7 (seven) days.   . BD INSULIN SYRINGE ULTRAFINE 31G X 5/16" 1 ML MISC USE  TO INJECT  INSULIN  FIVE  TIMES  DAILY AS INSTRUCTED  . Cholecalciferol (VITAMIN D3) 25 MCG (1000 UT) CAPS Take 1,000 Units by mouth at bedtime.  . fenofibrate 160 MG tablet Take 1 tablet (160 mg total) by mouth at bedtime.  . fluocinonide cream (LIDEX) 0.93 % Apply 1 application topically daily as needed (for rashes).   . fluticasone (FLONASE) 50 MCG/ACT nasal spray Place 2 sprays into both nostrils daily. (Patient taking differently: Place 2 sprays into both nostrils daily  as needed for allergies or rhinitis. )  . furosemide (LASIX) 40 MG tablet TAKE 1 AND 1/2 TABLETS EVERY DAY  . glucose blood (TRUE METRIX BLOOD GLUCOSE TEST) test strip USE TO TEST BLOOD SUGAR THREE TIMES DAILY AS DIRECTED  . HYDROcodone-acetaminophen (NORCO) 10-325 MG tablet Take 0.5 tablets by mouth 2 (two) times daily.  . insulin NPH Human (NOVOLIN N) 100 UNIT/ML injection Inject 45 units in am and 40 in the evening (Patient taking differently: Inject 45-50 Units into the skin See admin instructions. Inject 45 units into the skin before breakfast and 50 units before supper/evening meal)  . insulin regular (NOVOLIN R RELION) 100 units/mL injection Inject 0.25-0.3 mLs (25-30 Units total) into the skin 3 (three) times daily before meals. (Patient taking differently: Inject 35 Units into the skin 3 (three) times daily before meals. )  . isosorbide mononitrate (IMDUR) 30 MG 24 hr tablet Take 1 tablet (30 mg total) by mouth daily.  Marland Kitchen MAGNESIUM PO Take 1 tablet by mouth at bedtime.  . metoprolol tartrate (LOPRESSOR) 50 MG tablet Take 0.5 tablets (25 mg total) by mouth 2 (two) times daily.  . nitroGLYCERIN (NITROSTAT) 0.4 MG SL tablet Place 1 tablet (0.4 mg total) under the tongue every 5 (five) minutes as needed for chest pain.  Marland Kitchen oxybutynin (DITROPAN) 5 MG tablet Take 1 tablet (5 mg total) by mouth 2 (two) times daily.  . pantoprazole (PROTONIX) 20 MG tablet  Take 1 tablet (20 mg total) by mouth daily. (Patient taking differently: Take 20 mg by mouth at bedtime. )  . Probiotic Product (PROBIOTIC PO) Take 1 capsule by mouth at bedtime.   . tamsulosin (FLOMAX) 0.4 MG CAPS capsule Take 1 capsule (0.4 mg total) by mouth daily.  Marland Kitchen tiZANidine (ZANAFLEX) 4 MG tablet Take 4 mg by mouth 2 (two) times daily.   No facility-administered encounter medications on file as of 11/16/2018.     Allergies as of 11/16/2018 - Review Complete 11/16/2018  Allergen Reaction Noted  . Brilinta [ticagrelor] Shortness Of Breath  09/15/2018  . Levemir [insulin detemir] Swelling and Other (See Comments) 12/27/2012  . Codeine Other (See Comments)   . Lipitor [atorvastatin] Other (See Comments) 05/20/2013  . Novolog mix [insulin aspart prot & aspart] Other (See Comments) 05/19/2013    Past Medical History:  Diagnosis Date  . AAA (abdominal aortic aneurysm) ([place]) 12/15/2014   a. Mild aneurysmal dilatation of the infrarenal abdominal aorta 3.2 cm - f/u due by 2019.  . Arthritis    "all over" (07/30/2016)  . CAD (coronary artery disease)    a. stent to LAD 2000. b. possible spasm by cath 2001. c. IVUS/PTCA/DES to mLAD 05/2013. d. PTCA/DES of dRCA into ostial rPDA 07/2013. e. PTCA of OM2, DES to Cx, DES to [facility] 07/2016.  Marland Kitchen Chronic bronchitis ([place])   . Chronic diastolic CHF (congestive heart failure) ([place])   . Chronic lower back pain   . Chronic neck pain   . CKD (chronic kidney disease), stage III ([place])   . Diabetic peripheral neuropathy ([place])   . Ejection fraction    55%, 07/2010, mild inferior hypo  . GERD (gastroesophageal reflux disease)   . Gout   . H/O diverticulitis of colon 01/31/2015  . H/O hiatal hernia   . History of blood transfusion ~ 10/1940   "had pneumonia"  . History of kidney stones   . HTN (hypertension)   . Hyperlipidemia   . Melanoma of forearm, left ([place]) 02/2011   with wide excision   . Myocardial infarction ([place])   . Nephrolithiasis   . Obesity   . OSA on CPAP     [name] since 2000  . Peripheral neuropathy    both feet  . Positive D-dimer    a. significant elevation ,hospital 07/2010, etiology unclear. b. D Dimer chronically > 20.  Marland Kitchen Renal insufficiency   . Skin cancer   . Spinal stenosis    a. s/p surgical repair 2013  . Spinal stenosis of lumbar region   . Type II diabetes mellitus ([place])   . Ventral hernia     Past Surgical History:  Procedure Laterality Date  . ANTERIOR LAT LUMBAR FUSION  10/22/2017   Procedure: Lumbar two-three Lumbar three-four Lumbar four-five  Anteriolateral lumbar interbody fusion with percutaneous pedicle screw fixation and infuse;  Surgeon: [name], MD;  Location: [facility];  Service: Neurosurgery;;  . APPLICATION OF ROBOTIC ASSISTANCE FOR SPINAL PROCEDURE  10/22/2017   Procedure: APPLICATION OF ROBOTIC ASSISTANCE FOR SPINAL PROCEDURE;  Surgeon: [name], MD;  Location: [facility];  Service: Neurosurgery;;  . BACK SURGERY    . CARDIAC CATHETERIZATION N/A 07/30/2016   Procedure: Left Heart Cath and Coronary Angiography;  Surgeon: Nelva Bush, MD;  Location: [facility] CV LAB;  Service: Cardiovascular;  Laterality: N/A;  . CARDIAC CATHETERIZATION N/A 07/30/2016   Procedure: Coronary Stent Intervention;  Surgeon: Nelva Bush, MD;  Location: [facility] CV LAB;  Service:  Cardiovascular;  Laterality: N/A;  Mid CFX and MID RCA  . CARDIAC CATHETERIZATION N/A 07/30/2016   Procedure: Coronary Balloon Angioplasty;  Surgeon: Nelva Bush, MD;  Location: [facility] CV LAB;  Service: Cardiovascular;  Laterality: N/A;  OM 1  . CARPAL TUNNEL RELEASE Left   . CATARACT EXTRACTION W/ INTRAOCULAR LENS  IMPLANT, BILATERAL Bilateral ~ 2014  . CERVICAL DISC SURGERY  1990s  . CORONARY ANGIOPLASTY WITH STENT PLACEMENT  05/2013  . CORONARY ANGIOPLASTY WITH STENT PLACEMENT  07/26/2013   DES to RCA extending to PDA    . CORONARY ANGIOPLASTY WITH STENT PLACEMENT  07/30/2016  . CORONARY ANGIOPLASTY WITH STENT PLACEMENT  2000   CAD  . DENTAL SURGERY  04/2016   "got infected; had to dig it out"  . EYE SURGERY    . KNEE ARTHROSCOPY Right 1990's   rt  . LEFT AND RIGHT HEART CATHETERIZATION WITH CORONARY ANGIOGRAM N/A 07/26/2013   Procedure: LEFT AND RIGHT HEART CATHETERIZATION WITH CORONARY ANGIOGRAM;  Surgeon: [name], MD;  Location: [facility] CATH LAB;  Service: Cardiovascular;  Laterality: N/A;  . LEFT HEART CATH AND CORONARY ANGIOGRAPHY N/A 02/26/2017   Procedure: Left Heart Cath and Coronary Angiography;  Surgeon: Sherren Mocha, MD;   Location: [facility] CV LAB;  Service: Cardiovascular;  Laterality: N/A;  . LEFT HEART CATHETERIZATION WITH CORONARY ANGIOGRAM N/A 05/19/2013   Procedure: LEFT HEART CATHETERIZATION WITH CORONARY ANGIOGRAM;  Surgeon: [name], MD;  Location: [facility] CATH LAB;  Service: Cardiovascular;  Laterality: N/A;  . LUMBAR LAMINECTOMY/DECOMPRESSION MICRODISCECTOMY  08/30/2012   Procedure: LUMBAR LAMINECTOMY/DECOMPRESSION MICRODISCECTOMY 2 LEVELS;  Surgeon: [name], MD;  Location: [facility] NEURO ORS;  Service: Neurosurgery;  Laterality: Bilateral;  Bilateral Lumbar three-four Lumbar four-five Laminotomies  . LUMBAR PERCUTANEOUS PEDICLE SCREW 1 LEVEL Bilateral 10/22/2017   Procedure: LUMBAR PERCUTANEOUS PEDICLE SCREW 1 LEVEL;  Surgeon: [name], MD;  Location: [facility];  Service: Neurosurgery;  Laterality: Bilateral;  . LUMBAR SPINE SURGERY  04/07/2016   Dr. Ellene Route; "?ruptured disc"  . MELANOMA EXCISION Left 02/2011   forearm  . NASAL SINUS SURGERY  1970s   "cut windows in sinus pockets"  . PERCUTANEOUS CORONARY STENT INTERVENTION (PCI-S)  05/19/2013   Procedure: PERCUTANEOUS CORONARY STENT INTERVENTION (PCI-S);  Surgeon: [name], MD;  Location: [facility] CATH LAB;  Service: Cardiovascular;;  . ULNAR TUNNEL RELEASE Left 04/07/2016   Dr. Ellene Route    Family History  Problem Relation Age of Onset  . Cancer Mother        intestinal   . Heart disease Mother   . Cancer Father        prostate  . Migraines Daughter   . Leukemia Sister   . Heart disease Sister   . Prostate cancer Other   . Kidney cancer Other   . Cancer Other        Bladder cancer  . Coronary artery disease Other   . Hypertension Sister   . Hypertension Brother   . Heart attack Neg Hx   . Stroke Neg Hx     Social History   Socioeconomic History  . Marital status: Married    Spouse name: Not on file  . Number of children: Not on file  . Years of education: Not on file  . Highest education level: Not on file  Occupational  History  . Occupation: [employer]  . Financial resource strain: Not on file  . Food insecurity:    Worry: Not on file  Inability: Not on file  . Transportation needs:    Medical: Not on file    Non-medical: Not on file  Tobacco Use  . Smoking status: Former Smoker    Packs/day: 3.00    Years: 30.00    Pack years: 90.00    Types: Cigarettes    Last attempt to quit: 09/17/1996    Years since quitting: 22.1  . Smokeless tobacco: Never Used  Substance and Sexual Activity  . Alcohol use: No  . Drug use: No  . Sexual activity: Not Currently    Birth control/protection: None  Lifestyle  . Physical activity:    Days per week: Not on file    Minutes per session: Not on file  . Stress: Not on file  Relationships  . Social connections:    Talks on phone: Not on file    Gets together: Not on file    Attends religious service: Not on file    Active member of club or organization: Not on file    Attends meetings of clubs or organizations: Not on file    Relationship status: Not on file  . Intimate partner violence:    Fear of current or ex partner: Not on file    Emotionally abused: Not on file    Physically abused: Not on file    Forced sexual activity: Not on file  Other Topics Concern  . Not on file  Social History Narrative   Married and lives locally with his wife.  Sharyon Cable.    Review of Systems  Constitutional: Negative.   HENT: Negative.   Eyes: Negative.   Respiratory: Positive for apnea.   Cardiovascular: Negative.   Gastrointestinal: Negative.   Psychiatric/Behavioral: Positive for sleep disturbance.    Vitals:   11/16/18 1427  BP: (!) 142/80  Pulse: 81  SpO2: 95%     Physical Exam  Constitutional: He appears well-developed and well-nourished.  HENT:  Head: Normocephalic and atraumatic.  Eyes: Pupils are equal, round, and reactive to light. Conjunctivae are normal. Left eye exhibits no discharge.  Neck: Normal range of motion. Neck  supple. No tracheal deviation present. No thyromegaly present.  Cardiovascular: Normal rate and regular rhythm.  Pulmonary/Chest: Effort normal and breath sounds normal. No respiratory distress. He has no wheezes. He has no rales.  Abdominal: Soft. Bowel sounds are normal. He exhibits no distension. There is no abdominal tenderness. There is no rebound.  Musculoskeletal:        General: No edema.   Results of the [facility] flowsheet 11/16/2018  Sitting and reading 3  Watching TV 3  Sitting, inactive in a public place (e.g. a theatre or a meeting) 3  As a passenger in a car for an hour without a break 3  Lying down to rest in the afternoon when circumstances permit 3  Sitting and talking to someone 0  Sitting quietly after a lunch without alcohol 3  In a car, while stopped for a few minutes in traffic 0  Total score 18   Assessment:  Patient with known obstructive sleep apnea Machine is becoming dysfunctional, does not have a smart card to monitor compliance  Tolerating machine well  Obesity  History of congestive heart failure-recently treated in hospital  Plan/Recommendations: We will schedule the patient for a home sleep study  Contact the medical supply company following study  Encouraged to continue using current machine  I will see him back in the office in about 3 months  Encouraged him to  call with any significant concerns   Sherrilyn Rist MD [facility] 11/16/2018, 3:01 PM  CC: [name], MD

## 2018-11-17 ENCOUNTER — Ambulatory Visit (INDEPENDENT_AMBULATORY_CARE_PROVIDER_SITE_OTHER): Payer: Medicare HMO | Admitting: Medical

## 2018-11-17 ENCOUNTER — Encounter: Payer: Self-pay | Admitting: Medical

## 2018-11-17 VITALS — BP 131/63 | HR 65 | Resp 12 | Ht 70.5 in | Wt 255.0 lb

## 2018-11-17 DIAGNOSIS — N19 Unspecified kidney failure: Secondary | ICD-10-CM | POA: Diagnosis not present

## 2018-11-17 DIAGNOSIS — I517 Cardiomegaly: Secondary | ICD-10-CM | POA: Diagnosis not present

## 2018-11-17 DIAGNOSIS — I10 Essential (primary) hypertension: Secondary | ICD-10-CM | POA: Diagnosis not present

## 2018-11-17 NOTE — Progress Notes (Signed)
Subjective:    Patient ID: William Hendrix, male    DOB: Jun 21, 1941, 78 y.o.   MRN: [id number]  HPI  Pt in for follow up.  Pt weighed 255 lb on home scale today wearing underwear only. .Pt states around day of discharge his weight was 255 lb. Heavier today on our scale but in clothes and wearing boots.   Pt started back on lasix on Monday. He restarted it on Tuesday but then stopped today after I gave advise after seeing low gfr and low blood pressure the other day. His blood pressure is better today and he is hydrating better at home.  Today reading/ blood pressure with patient machine and our machine was 136/66.  Pt went to ED the other day and had 3 liters of in ED. [facility] about [facility] hours. Then they discharge him.  Pt is on 1/2 of metoprolol. Twice daily. I made dose adjustment on last visit.    Review of Systems  Constitutional: Negative for chills, fatigue and fever.  HENT: Negative for congestion, drooling, ear pain and facial swelling.   Respiratory: Negative for chest tightness, shortness of breath and wheezing.   Cardiovascular: Negative for chest pain and palpitations.  Gastrointestinal: Negative for abdominal pain.  Genitourinary: Negative for dysuria, flank pain and frequency.  Musculoskeletal: Positive for back pain. Negative for myalgias and neck stiffness.  Neurological: Negative for dizziness and headaches.       Pt not feel light headed or staggering. But stats his balance if effected due to back pan and hip pain.  Hematological: Negative for adenopathy. Does not bruise/bleed easily.  Psychiatric/Behavioral: Negative for behavioral problems and confusion.    Past Medical History:  Diagnosis Date  . AAA (abdominal aortic aneurysm) ([place]) 12/15/2014   a. Mild aneurysmal dilatation of the infrarenal abdominal aorta 3.2 cm - f/u due by 2019.  . Arthritis    "all over" (07/30/2016)  . CAD (coronary artery disease)    a. stent to LAD 2000. b. possible  spasm by cath 2001. c. IVUS/PTCA/DES to mLAD 05/2013. d. PTCA/DES of dRCA into ostial rPDA 07/2013. e. PTCA of OM2, DES to Cx, DES to [facility] 07/2016.  Marland Kitchen Chronic bronchitis ([place])   . Chronic diastolic CHF (congestive heart failure) ([place])   . Chronic lower back pain   . Chronic neck pain   . CKD (chronic kidney disease), stage III ([place])   . Diabetic peripheral neuropathy ([place])   . Ejection fraction    55%, 07/2010, mild inferior hypo  . GERD (gastroesophageal reflux disease)   . Gout   . H/O diverticulitis of colon 01/31/2015  . H/O hiatal hernia   . History of blood transfusion ~ 10/1940   "had pneumonia"  . History of kidney stones   . HTN (hypertension)   . Hyperlipidemia   . Melanoma of forearm, left ([place]) 02/2011   with wide excision   . Myocardial infarction ([place])   . Nephrolithiasis   . Obesity   . OSA on CPAP     [name] since 2000  . Peripheral neuropathy    both feet  . Positive D-dimer    a. significant elevation ,hospital 07/2010, etiology unclear. b. D Dimer chronically > 20.  Marland Kitchen Renal insufficiency   . Skin cancer   . Spinal stenosis    a. s/p surgical repair 2013  . Spinal stenosis of lumbar region   . Type II diabetes mellitus ([place])   . Ventral hernia  Social History   Socioeconomic History  . Marital status: Married    Spouse name: Not on file  . Number of children: Not on file  . Years of education: Not on file  . Highest education level: Not on file  Occupational History  . Occupation: [employer]  . Financial resource strain: Not on file  . Food insecurity:    Worry: Not on file    Inability: Not on file  . Transportation needs:    Medical: Not on file    Non-medical: Not on file  Tobacco Use  . Smoking status: Former Smoker    Packs/day: 3.00    Years: 30.00    Pack years: 90.00    Types: Cigarettes    Last attempt to quit: 09/17/1996    Years since quitting: 22.1  . Smokeless tobacco: Never Used  Substance and Sexual Activity   . Alcohol use: No  . Drug use: No  . Sexual activity: Not Currently    Birth control/protection: None  Lifestyle  . Physical activity:    Days per week: Not on file    Minutes per session: Not on file  . Stress: Not on file  Relationships  . Social connections:    Talks on phone: Not on file    Gets together: Not on file    Attends religious service: Not on file    Active member of club or organization: Not on file    Attends meetings of clubs or organizations: Not on file    Relationship status: Not on file  . Intimate partner violence:    Fear of current or ex partner: Not on file    Emotionally abused: Not on file    Physically abused: Not on file    Forced sexual activity: Not on file  Other Topics Concern  . Not on file  Social History Narrative   Married and lives locally with his wife.  Sharyon Cable.    Past Surgical History:  Procedure Laterality Date  . ANTERIOR LAT LUMBAR FUSION  10/22/2017   Procedure: Lumbar two-three Lumbar three-four Lumbar four-five Anteriolateral lumbar interbody fusion with percutaneous pedicle screw fixation and infuse;  Surgeon: [name], MD;  Location: [place];  Service: Neurosurgery;;  . APPLICATION OF ROBOTIC ASSISTANCE FOR SPINAL PROCEDURE  10/22/2017   Procedure: APPLICATION OF ROBOTIC ASSISTANCE FOR SPINAL PROCEDURE;  Surgeon: [name], MD;  Location: [place];  Service: Neurosurgery;;  . BACK SURGERY    . CARDIAC CATHETERIZATION N/A 07/30/2016   Procedure: Left Heart Cath and Coronary Angiography;  Surgeon: Nelva Bush, MD;  Location: [place] CV LAB;  Service: Cardiovascular;  Laterality: N/A;  . CARDIAC CATHETERIZATION N/A 07/30/2016   Procedure: Coronary Stent Intervention;  Surgeon: Nelva Bush, MD;  Location: [place] CV LAB;  Service: Cardiovascular;  Laterality: N/A;  Mid CFX and MID RCA  . CARDIAC CATHETERIZATION N/A 07/30/2016   Procedure: Coronary Balloon Angioplasty;  Surgeon: Nelva Bush, MD;  Location:  [place] CV LAB;  Service: Cardiovascular;  Laterality: N/A;  OM 1  . CARPAL TUNNEL RELEASE Left   . CATARACT EXTRACTION W/ INTRAOCULAR LENS  IMPLANT, BILATERAL Bilateral ~ 2014  . CERVICAL DISC SURGERY  1990s  . CORONARY ANGIOPLASTY WITH STENT PLACEMENT  05/2013  . CORONARY ANGIOPLASTY WITH STENT PLACEMENT  07/26/2013   DES to RCA extending to PDA    . CORONARY ANGIOPLASTY WITH STENT PLACEMENT  07/30/2016  . CORONARY ANGIOPLASTY WITH STENT PLACEMENT  2000  CAD  . DENTAL SURGERY  04/2016   "got infected; had to dig it out"  . EYE SURGERY    . KNEE ARTHROSCOPY Right 1990's   rt  . LEFT AND RIGHT HEART CATHETERIZATION WITH CORONARY ANGIOGRAM N/A 07/26/2013   Procedure: LEFT AND RIGHT HEART CATHETERIZATION WITH CORONARY ANGIOGRAM;  Surgeon: [name], MD;  Location: [facility] CATH LAB;  Service: Cardiovascular;  Laterality: N/A;  . LEFT HEART CATH AND CORONARY ANGIOGRAPHY N/A 02/26/2017   Procedure: Left Heart Cath and Coronary Angiography;  Surgeon: Sherren Mocha, MD;  Location: [place] CV LAB;  Service: Cardiovascular;  Laterality: N/A;  . LEFT HEART CATHETERIZATION WITH CORONARY ANGIOGRAM N/A 05/19/2013   Procedure: LEFT HEART CATHETERIZATION WITH CORONARY ANGIOGRAM;  Surgeon: [name], MD;  Location: [facility] CATH LAB;  Service: Cardiovascular;  Laterality: N/A;  . LUMBAR LAMINECTOMY/DECOMPRESSION MICRODISCECTOMY  08/30/2012   Procedure: LUMBAR LAMINECTOMY/DECOMPRESSION MICRODISCECTOMY 2 LEVELS;  Surgeon: [name], MD;  Location: [place] NEURO ORS;  Service: Neurosurgery;  Laterality: Bilateral;  Bilateral Lumbar three-four Lumbar four-five Laminotomies  . LUMBAR PERCUTANEOUS PEDICLE SCREW 1 LEVEL Bilateral 10/22/2017   Procedure: LUMBAR PERCUTANEOUS PEDICLE SCREW 1 LEVEL;  Surgeon: [name], MD;  Location: [facility];  Service: Neurosurgery;  Laterality: Bilateral;  . LUMBAR SPINE SURGERY  04/07/2016   Dr. Ellene Route; "?ruptured disc"  . MELANOMA EXCISION Left 02/2011   forearm  .  NASAL SINUS SURGERY  1970s   "cut windows in sinus pockets"  . PERCUTANEOUS CORONARY STENT INTERVENTION (PCI-S)  05/19/2013   Procedure: PERCUTANEOUS CORONARY STENT INTERVENTION (PCI-S);  Surgeon: [name], MD;  Location: [facility] CATH LAB;  Service: Cardiovascular;;  . ULNAR TUNNEL RELEASE Left 04/07/2016   Dr. Ellene Route    Family History  Problem Relation Age of Onset  . Cancer Mother        intestinal   . Heart disease Mother   . Cancer Father        prostate  . Migraines Daughter   . Leukemia Sister   . Heart disease Sister   . Prostate cancer Other   . Kidney cancer Other   . Cancer Other        Bladder cancer  . Coronary artery disease Other   . Hypertension Sister   . Hypertension Brother   . Heart attack Neg Hx   . Stroke Neg Hx     Allergies  Allergen Reactions  . Brilinta [Ticagrelor] Shortness Of Breath  . Levemir [Insulin Detemir] Swelling and Other (See Comments)    Patient had redness and swelling and tenderness at injection site.  . Codeine Other (See Comments)    Makes him "shakey", is OK with hydrocodone GI UPSET & TREMORS  . Lipitor [Atorvastatin] Other (See Comments)    Muscle aches; affects liver function- Patient is currently taking  . Novolog Mix [Insulin Aspart Prot & Aspart] Other (See Comments)    Causes skin to swell/itch at injection site    Current Outpatient Medications on File Prior to Visit  Medication Sig Dispense Refill  . acetaminophen (TYLENOL) 500 MG tablet Take 500 mg by mouth 2 (two) times daily.     Marland Kitchen albuterol (PROVENTIL HFA;VENTOLIN HFA) 108 (90 Base) MCG/ACT inhaler Inhale 2 puffs into the lungs every 6 (six) hours as needed for wheezing or shortness of breath. 1 Inhaler 0  . allopurinol (ZYLOPRIM) 300 MG tablet Take 1 tablet (300 mg total) by mouth daily. 90 tablet 1  . aspirin EC 81 MG tablet Take 81  mg by mouth daily.    Marland Kitchen atorvastatin (LIPITOR) 40 MG tablet Take 1 tablet (40 mg total) by mouth daily. 90 tablet 1  . B Complex  Vitamins (B COMPLEX-B12 PO) Take 1 tablet by mouth every 7 (seven) days.     . BD INSULIN SYRINGE ULTRAFINE 31G X 5/16" 1 ML MISC USE  TO INJECT  INSULIN  FIVE  TIMES  DAILY AS INSTRUCTED 450 each 3  . Cholecalciferol (VITAMIN D3) 25 MCG (1000 UT) CAPS Take 1,000 Units by mouth at bedtime.    . fenofibrate 160 MG tablet Take 1 tablet (160 mg total) by mouth at bedtime. 90 tablet 1  . fluocinonide cream (LIDEX) 7.03 % Apply 1 application topically daily as needed (for rashes).     . fluticasone (FLONASE) 50 MCG/ACT nasal spray Place 2 sprays into both nostrils daily. (Patient taking differently: Place 2 sprays into both nostrils daily as needed for allergies or rhinitis. ) 16 g 1  . furosemide (LASIX) 40 MG tablet TAKE 1 AND 1/2 TABLETS EVERY DAY 135 tablet 3  . glucose blood (TRUE METRIX BLOOD GLUCOSE TEST) test strip USE TO TEST BLOOD SUGAR THREE TIMES DAILY AS DIRECTED 300 each 3  . HYDROcodone-acetaminophen (NORCO) 10-325 MG tablet Take 0.5 tablets by mouth 2 (two) times daily.  0  . insulin NPH Human (NOVOLIN N) 100 UNIT/ML injection Inject 45 units in am and 40 in the evening (Patient taking differently: Inject 45-50 Units into the skin See admin instructions. Inject 45 units into the skin before breakfast and 50 units before supper/evening meal) 30 mL 3  . insulin regular (NOVOLIN R RELION) 100 units/mL injection Inject 0.25-0.3 mLs (25-30 Units total) into the skin 3 (three) times daily before meals. (Patient taking differently: Inject 35 Units into the skin 3 (three) times daily before meals. ) 70 mL 3  . isosorbide mononitrate (IMDUR) 30 MG 24 hr tablet Take 1 tablet (30 mg total) by mouth daily. 90 tablet 3  . MAGNESIUM PO Take 1 tablet by mouth at bedtime.    . metoprolol tartrate (LOPRESSOR) 50 MG tablet Take 0.5 tablets (25 mg total) by mouth 2 (two) times daily.    . nitroGLYCERIN (NITROSTAT) 0.4 MG SL tablet Place 1 tablet (0.4 mg total) under the tongue every 5 (five) minutes as needed  for chest pain. 100 tablet 3  . oxybutynin (DITROPAN) 5 MG tablet Take 1 tablet (5 mg total) by mouth 2 (two) times daily. 60 tablet 1  . pantoprazole (PROTONIX) 20 MG tablet Take 1 tablet (20 mg total) by mouth daily. (Patient taking differently: Take 20 mg by mouth at bedtime. ) 90 tablet 3  . Probiotic Product (PROBIOTIC PO) Take 1 capsule by mouth at bedtime.     . tamsulosin (FLOMAX) 0.4 MG CAPS capsule Take 1 capsule (0.4 mg total) by mouth daily. 90 capsule 1  . tiZANidine (ZANAFLEX) 4 MG tablet Take 4 mg by mouth 2 (two) times daily.  6   No current facility-administered medications on file prior to visit.     BP 131/63   Pulse 65   Resp 12   Ht 5' 10.5" (1.791 m)   Wt 255 lb (115.7 kg) Comment: at home wearing only underwear.  SpO2 99%   BMI 36.07 kg/m       Objective:   Physical Exam  General Mental Status- Alert. General Appearance- Not in acute distress.   Skin General: Color- Normal Color. Moisture- Normal Moisture.  Neck Carotid  Arteries- Normal color. Moisture- Normal Moisture. No carotid bruits. No JVD.  Chest and Lung Exam Auscultation: Breath Sounds:-Normal.  Cardiovascular Auscultation:Rythm- Regular. Murmurs & Other Heart Sounds:Auscultation of the heart reveals- No Murmurs.  Abdomen Inspection:-Inspeection Normal. Palpation/Percussion:Note:No mass. Palpation and Percussion of the abdomen reveal- Non Tender, Non Distended + BS, no rebound or guarding.   Neurologic Cranial Nerve exam:- CN III-XII intact(No nystagmus), symmetric smile.Normal/Intact Strength:- 5/5 equal and symmetric strength both upper and lower extremities.  Lower ext- calf symmetric.no pedal edema negative homans signs.      Assessment & Plan:  Your bp is better today and you don't have any obvious pedal edema. Lungs are clear. o2 sat 99%.  Recommend stay on metroprolol 1/2 tab twice a day and lasix 1 and 1/2 tab daily.   Keep checking bp daily and check weight daily.  Give me update by this coming Monday.   Make sure you are staying hydrated. I want you to drink 2 bottle of propel in addition to your regular diet/fluid intake daily.  Referral to nephrologist placed.  Will review recent office visits and ED evaluation with Dr. Charlett Blake  Keep appointment with me but if feeling well can cancel 24-48 hours before appointment.  Follow up with Dr. Charlett Blake for regular follow up.  Mackie Pai, PA-C

## 2018-11-17 NOTE — Patient Instructions (Addendum)
Your bp is better today and you don't have any obvious pedal edema. Lungs are clear. o2 sat 99%.  Recommend stay on metroprolol 1/2 tab twice a day and lasix 1 and 1/2 tab daily.   Keep checking bp daily and check weight daily. Give me update by this coming Monday.   Make sure you are staying hydrated. I want you to drink 2 bottle of propel in addition to your regular diet/fluid intake daily.  Referral to nephrologist placed.  Will review recent office visits and ED evaluation with Dr. Charlett Blake  Keep appointment with me but if feeling well can cancel 24-48 hours before appointment.  Follow up with Dr. Charlett Blake for regular follow up.

## 2018-11-23 ENCOUNTER — Telehealth: Payer: Self-pay | Admitting: Family Medicine

## 2018-11-23 NOTE — Telephone Encounter (Signed)
Copied from [place] [id number]. Topic: Quick Communication - See Telephone Encounter >> Nov 23, 2018  9:54 AM Bea Graff, NT wrote: CRM for notification. See Telephone encounter for: 11/23/18. Pts wife calling with husbands BP readings for the past few days for [employer]. The readings are as follows:  11/18/2018: 6:30pm: 163/71        11/19/2018: 4:30pm: 140/69         11/20/2018: 11:00am: 99/46 2:30pm: 116/55 4:00pm: 123/64 6:40pm: 137/63       11/21/2018: 8:30pm: 172/68          11/22/2018: 9:15am: 102/49 10:30am: 96/50 2:30pm: 140/62 10:00pm: 184/74   11/23/2018: 9:50am: 167/78  She also states she thinks her husband passed a kidney stone yesterday afternoon due to the pain he was in and he had blood in his urine. She states this morning the pain and blood was gone and urine was back clear. She said that the kidney specialist has not called for the appt yet.

## 2018-11-24 NOTE — Telephone Encounter (Signed)
Patient wife called back to say patient is now having urinary frequency also burning during urination. She want to know if they should pass by and leave a urine sample to be checked. Please call back with answer thank you Ph# [phone]

## 2018-11-28 ENCOUNTER — Ambulatory Visit (INDEPENDENT_AMBULATORY_CARE_PROVIDER_SITE_OTHER): Payer: Medicare HMO | Admitting: Medical

## 2018-11-28 ENCOUNTER — Encounter: Payer: Self-pay | Admitting: Medical

## 2018-11-28 VITALS — BP 126/62 | HR 79 | Temp 98.1°F | Resp 16 | Ht 70.5 in | Wt 256.1 lb

## 2018-11-28 DIAGNOSIS — R319 Hematuria, unspecified: Secondary | ICD-10-CM

## 2018-11-28 DIAGNOSIS — R300 Dysuria: Secondary | ICD-10-CM

## 2018-11-28 DIAGNOSIS — R350 Frequency of micturition: Secondary | ICD-10-CM

## 2018-11-28 DIAGNOSIS — Z87442 Personal history of urinary calculi: Secondary | ICD-10-CM | POA: Diagnosis not present

## 2018-11-28 MED ORDER — SULFAMETHOXAZOLE-TRIMETHOPRIM 800-160 MG PO TABS: 1.0000 | ORAL_TABLET | Freq: Two times a day (BID) | ORAL | 0 refills | Status: DC

## 2018-11-28 MED FILL — SULFAMETHOXAZOLE-TMP DS TAB: 800-160 | 7 days supply | Qty: 14 | Fill #0

## 2018-11-28 NOTE — Telephone Encounter (Signed)
Patient scheduled with William Hendrix 11/28/18

## 2018-11-28 NOTE — Progress Notes (Signed)
Subjective:    Patient ID: William Hendrix, male    DOB: 01-05-1941, 78 y.o.   MRN: [id number]  HPI  Pt states this past Tuesday night he had urinated and it looked like blood. Then next 2 days the color of urine looked more concentrated and darker. Then it eventually did clear up.  Pt speculated that he did pass kidney stone.   Pt currently has some back pain but not severe as it was past 2 weeks ago. Less since he saw blood in urine.(Pain he describes seems more consistent with his chronic lower back pain.)  Pt had recent uti end of January.   Pt still has burning when he urinates. Just little bit with some urgency.   Review of Systems  Constitutional: Negative for chills, fatigue and fever.  Respiratory: Negative for chest tightness, shortness of breath and wheezing.   Cardiovascular: Negative for chest pain and palpitations.  Gastrointestinal: Negative for abdominal pain.  Genitourinary: Positive for dysuria and frequency. Negative for difficulty urinating, penile pain, scrotal swelling and urgency.  Musculoskeletal: Positive for back pain.  Skin: Negative for rash.  Neurological: Negative for seizures, facial asymmetry, speech difficulty, weakness and light-headedness.  Hematological: Negative for adenopathy. Does not bruise/bleed easily.  Psychiatric/Behavioral: Negative for behavioral problems and confusion.    Past Medical History:  Diagnosis Date  . AAA (abdominal aortic aneurysm) ([place]) 12/15/2014   a. Mild aneurysmal dilatation of the infrarenal abdominal aorta 3.2 cm - f/u due by 2019.  . Arthritis    "all over" (07/30/2016)  . CAD (coronary artery disease)    a. stent to LAD 2000. b. possible spasm by cath 2001. c. IVUS/PTCA/DES to mLAD 05/2013. d. PTCA/DES of dRCA into ostial rPDA 07/2013. e. PTCA of OM2, DES to Cx, DES to [facility] 07/2016.  Marland Kitchen Chronic bronchitis ([place])   . Chronic diastolic CHF (congestive heart failure) ([place])   . Chronic lower back pain   . Chronic  neck pain   . CKD (chronic kidney disease), stage III ([place])   . Diabetic peripheral neuropathy ([place])   . Ejection fraction    55%, 07/2010, mild inferior hypo  . GERD (gastroesophageal reflux disease)   . Gout   . H/O diverticulitis of colon 01/31/2015  . H/O hiatal hernia   . History of blood transfusion ~ 10/1940   "had pneumonia"  . History of kidney stones   . HTN (hypertension)   . Hyperlipidemia   . Melanoma of forearm, left ([place]) 02/2011   with wide excision   . Myocardial infarction ([place])   . Nephrolithiasis   . Obesity   . OSA on CPAP     [name] since 2000  . Peripheral neuropathy    both feet  . Positive D-dimer    a. significant elevation ,hospital 07/2010, etiology unclear. b. D Dimer chronically > 20.  Marland Kitchen Renal insufficiency   . Skin cancer   . Spinal stenosis    a. s/p surgical repair 2013  . Spinal stenosis of lumbar region   . Type II diabetes mellitus ([place])   . Ventral hernia      Social History   Socioeconomic History  . Marital status: Married    Spouse name: Not on file  . Number of children: Not on file  . Years of education: Not on file  . Highest education level: Not on file  Occupational History  . Occupation: [employer]  . Financial resource strain: Not on file  .  Food insecurity:    Worry: Not on file    Inability: Not on file  . Transportation needs:    Medical: Not on file    Non-medical: Not on file  Tobacco Use  . Smoking status: Former Smoker    Packs/day: 3.00    Years: 30.00    Pack years: 90.00    Types: Cigarettes    Last attempt to quit: 09/17/1996    Years since quitting: 22.2  . Smokeless tobacco: Never Used  Substance and Sexual Activity  . Alcohol use: No  . Drug use: No  . Sexual activity: Not Currently    Birth control/protection: None  Lifestyle  . Physical activity:    Days per week: Not on file    Minutes per session: Not on file  . Stress: Not on file  Relationships  . Social connections:     Talks on phone: Not on file    Gets together: Not on file    Attends religious service: Not on file    Active member of club or organization: Not on file    Attends meetings of clubs or organizations: Not on file    Relationship status: Not on file  . Intimate partner violence:    Fear of current or ex partner: Not on file    Emotionally abused: Not on file    Physically abused: Not on file    Forced sexual activity: Not on file  Other Topics Concern  . Not on file  Social History Narrative   Married and lives locally with his wife.  Sharyon Cable.    Past Surgical History:  Procedure Laterality Date  . ANTERIOR LAT LUMBAR FUSION  10/22/2017   Procedure: Lumbar two-three Lumbar three-four Lumbar four-five Anteriolateral lumbar interbody fusion with percutaneous pedicle screw fixation and infuse;  Surgeon: [name], MD;  Location: [place];  Service: Neurosurgery;;  . APPLICATION OF ROBOTIC ASSISTANCE FOR SPINAL PROCEDURE  10/22/2017   Procedure: APPLICATION OF ROBOTIC ASSISTANCE FOR SPINAL PROCEDURE;  Surgeon: [name], MD;  Location: [place];  Service: Neurosurgery;;  . BACK SURGERY    . CARDIAC CATHETERIZATION N/A 07/30/2016   Procedure: Left Heart Cath and Coronary Angiography;  Surgeon: Nelva Bush, MD;  Location: [place] CV LAB;  Service: Cardiovascular;  Laterality: N/A;  . CARDIAC CATHETERIZATION N/A 07/30/2016   Procedure: Coronary Stent Intervention;  Surgeon: Nelva Bush, MD;  Location: [place] CV LAB;  Service: Cardiovascular;  Laterality: N/A;  Mid CFX and MID RCA  . CARDIAC CATHETERIZATION N/A 07/30/2016   Procedure: Coronary Balloon Angioplasty;  Surgeon: Nelva Bush, MD;  Location: [place] CV LAB;  Service: Cardiovascular;  Laterality: N/A;  OM 1  . CARPAL TUNNEL RELEASE Left   . CATARACT EXTRACTION W/ INTRAOCULAR LENS  IMPLANT, BILATERAL Bilateral ~ 2014  . CERVICAL DISC SURGERY  1990s  . CORONARY ANGIOPLASTY WITH STENT PLACEMENT  05/2013  .  CORONARY ANGIOPLASTY WITH STENT PLACEMENT  07/26/2013   DES to RCA extending to PDA    . CORONARY ANGIOPLASTY WITH STENT PLACEMENT  07/30/2016  . CORONARY ANGIOPLASTY WITH STENT PLACEMENT  2000   CAD  . DENTAL SURGERY  04/2016   "got infected; had to dig it out"  . EYE SURGERY    . KNEE ARTHROSCOPY Right 1990's   rt  . LEFT AND RIGHT HEART CATHETERIZATION WITH CORONARY ANGIOGRAM N/A 07/26/2013   Procedure: LEFT AND RIGHT HEART CATHETERIZATION WITH CORONARY ANGIOGRAM;  Surgeon: [name], MD;  Location: [facility]  CATH LAB;  Service: Cardiovascular;  Laterality: N/A;  . LEFT HEART CATH AND CORONARY ANGIOGRAPHY N/A 02/26/2017   Procedure: Left Heart Cath and Coronary Angiography;  Surgeon: Sherren Mocha, MD;  Location: [place] CV LAB;  Service: Cardiovascular;  Laterality: N/A;  . LEFT HEART CATHETERIZATION WITH CORONARY ANGIOGRAM N/A 05/19/2013   Procedure: LEFT HEART CATHETERIZATION WITH CORONARY ANGIOGRAM;  Surgeon: [name], MD;  Location: [facility] CATH LAB;  Service: Cardiovascular;  Laterality: N/A;  . LUMBAR LAMINECTOMY/DECOMPRESSION MICRODISCECTOMY  08/30/2012   Procedure: LUMBAR LAMINECTOMY/DECOMPRESSION MICRODISCECTOMY 2 LEVELS;  Surgeon: [name], MD;  Location: [place] NEURO ORS;  Service: Neurosurgery;  Laterality: Bilateral;  Bilateral Lumbar three-four Lumbar four-five Laminotomies  . LUMBAR PERCUTANEOUS PEDICLE SCREW 1 LEVEL Bilateral 10/22/2017   Procedure: LUMBAR PERCUTANEOUS PEDICLE SCREW 1 LEVEL;  Surgeon: [name], MD;  Location: [place];  Service: Neurosurgery;  Laterality: Bilateral;  . LUMBAR SPINE SURGERY  04/07/2016   Dr. Ellene Route; "?ruptured disc"  . MELANOMA EXCISION Left 02/2011   forearm  . NASAL SINUS SURGERY  1970s   "cut windows in sinus pockets"  . PERCUTANEOUS CORONARY STENT INTERVENTION (PCI-S)  05/19/2013   Procedure: PERCUTANEOUS CORONARY STENT INTERVENTION (PCI-S);  Surgeon: [name], MD;  Location[facility] CATH LAB;  Service: Cardiovascular;;  .  ULNAR TUNNEL RELEASE Left 04/07/2016   Dr. Ellene Route    Family History  Problem Relation Age of Onset  . Cancer Mother        intestinal   . Heart disease Mother   . Cancer Father        prostate  . Migraines Daughter   . Leukemia Sister   . Heart disease Sister   . Prostate cancer Other   . Kidney cancer Other   . Cancer Other        Bladder cancer  . Coronary artery disease Other   . Hypertension Sister   . Hypertension Brother   . Heart attack Neg Hx   . Stroke Neg Hx     Allergies  Allergen Reactions  . Brilinta [Ticagrelor] Shortness Of Breath  . Levemir [Insulin Detemir] Swelling and Other (See Comments)    Patient had redness and swelling and tenderness at injection site.  . Codeine Other (See Comments)    Makes him "shakey", is OK with hydrocodone GI UPSET & TREMORS  . Lipitor [Atorvastatin] Other (See Comments)    Muscle aches; affects liver function- Patient is currently taking  . Novolog Mix [Insulin Aspart Prot & Aspart] Other (See Comments)    Causes skin to swell/itch at injection site    Current Outpatient Medications on File Prior to Visit  Medication Sig Dispense Refill  . acetaminophen (TYLENOL) 500 MG tablet Take 500 mg by mouth 2 (two) times daily.     Marland Kitchen albuterol (PROVENTIL HFA;VENTOLIN HFA) 108 (90 Base) MCG/ACT inhaler Inhale 2 puffs into the lungs every 6 (six) hours as needed for wheezing or shortness of breath. 1 Inhaler 0  . allopurinol (ZYLOPRIM) 300 MG tablet Take 1 tablet (300 mg total) by mouth daily. 90 tablet 1  . aspirin EC 81 MG tablet Take 81 mg by mouth daily.    Marland Kitchen atorvastatin (LIPITOR) 40 MG tablet Take 1 tablet (40 mg total) by mouth daily. 90 tablet 1  . B Complex Vitamins (B COMPLEX-B12 PO) Take 1 tablet by mouth once a week.     . BD INSULIN SYRINGE ULTRAFINE 31G X 5/16" 1 ML MISC USE  TO INJECT  INSULIN  FIVE  TIMES  DAILY AS INSTRUCTED 450 each 3  . Cholecalciferol (VITAMIN D3) 25 MCG (1000 UT) CAPS Take 1,000 Units by mouth at  bedtime.    . fenofibrate 160 MG tablet Take 1 tablet (160 mg total) by mouth at bedtime. 90 tablet 1  . fluocinonide cream (LIDEX) 0.35 % Apply 1 application topically daily as needed (for rashes).     . fluticasone (FLONASE) 50 MCG/ACT nasal spray Place 2 sprays into both nostrils daily. (Patient taking differently: Place 2 sprays into both nostrils daily as needed for allergies or rhinitis. ) 16 g 1  . furosemide (LASIX) 40 MG tablet TAKE 1 AND 1/2 TABLETS EVERY DAY 135 tablet 3  . glucose blood (TRUE METRIX BLOOD GLUCOSE TEST) test strip USE TO TEST BLOOD SUGAR THREE TIMES DAILY AS DIRECTED 300 each 3  . HYDROcodone-acetaminophen (NORCO) 10-325 MG tablet Take 0.5 tablets by mouth 2 (two) times daily.  0  . insulin NPH Human (NOVOLIN N) 100 UNIT/ML injection Inject 45 units in am and 40 in the evening (Patient taking differently: Inject 45-50 Units into the skin See admin instructions. Inject 45 units into the skin before breakfast and 50 units before supper/evening meal) 30 mL 3  . insulin regular (NOVOLIN R RELION) 100 units/mL injection Inject 0.25-0.3 mLs (25-30 Units total) into the skin 3 (three) times daily before meals. (Patient taking differently: Inject 35 Units into the skin 3 (three) times daily before meals. ) 70 mL 3  . isosorbide mononitrate (IMDUR) 30 MG 24 hr tablet Take 1 tablet (30 mg total) by mouth daily. 90 tablet 3  . MAGNESIUM PO Take 1 tablet by mouth at bedtime.    . metoprolol tartrate (LOPRESSOR) 50 MG tablet Take 0.5 tablets (25 mg total) by mouth 2 (two) times daily.    . nitroGLYCERIN (NITROSTAT) 0.4 MG SL tablet Place 1 tablet (0.4 mg total) under the tongue every 5 (five) minutes as needed for chest pain. 100 tablet 3  . oxybutynin (DITROPAN) 5 MG tablet Take 1 tablet (5 mg total) by mouth 2 (two) times daily. 60 tablet 1  . pantoprazole (PROTONIX) 20 MG tablet Take 1 tablet (20 mg total) by mouth daily. (Patient taking differently: Take 20 mg by mouth at bedtime. )  90 tablet 3  . Probiotic Product (PROBIOTIC PO) Take 1 capsule by mouth at bedtime.     . tamsulosin (FLOMAX) 0.4 MG CAPS capsule Take 1 capsule (0.4 mg total) by mouth daily. 90 capsule 1  . tiZANidine (ZANAFLEX) 4 MG tablet Take 4 mg by mouth 2 (two) times daily.  6   No current facility-administered medications on file prior to visit.     BP 126/62 (BP Location: Right Arm, Patient Position: Sitting, Cuff Size: Large)   Pulse 79   Temp 98.1 F (36.7 C) (Oral)   Resp 16   Ht 5' 10.5" (1.791 m)   Wt 256 lb 1 oz (116.1 kg)   BMI 36.22 kg/m        Objective:   Physical Exam   General- No acute distress. Pleasant patient. Neck- Full range of motion, no jvd Lungs- Clear, even and unlabored. Heart- regular rate and rhythm. Neurologic- CNII- XII grossly intact.  Abdomen- soft, non-tender, non-distended, suprapubic tenderness. No rebound or guarding.  Back- no cva tenderness.         Assessment & Plan:  For recent frequent urination, dysuria, and blood in urine, will get urine culture and have you start bactrim  DS twice daily.  Did advise patient to start back on Ditropan twice daily as his PCP has advised in the past.  Will refer you to urologist to evaluate the gross hematuria event and hx of kidney stone.  If you get recurrent gross blood with more severe back pain let me know and will order ct abdomen/pelvis.  Stay hydrated. Keep checking blood pressure.  Follow up with pcp coming up as scheduled. Or come in sooner if needed  [employer], PA-C

## 2018-11-28 NOTE — Patient Instructions (Addendum)
For recent frequent urination, dysuria, and blood in urine, will get urine culture and have you start bactrim DS twice daily.  Did advise patient to start back on Ditropan twice daily as his PCP has advised in the past.  Will refer you to urologist to evaluate the gross hematuria event and hx of kidney stone.  If you get recurrent gross blood with more severe back pain let me know and will order ct abdomen/pelvis.  Stay hydrated. Keep checking blood pressure.  Follow up with pcp coming up as scheduled. Or come in sooner if needed

## 2018-11-30 ENCOUNTER — Telehealth: Payer: Self-pay | Admitting: Medical

## 2018-11-30 LAB — URINE CULTURE
MICRO NUMBER:: 204250
SPECIMEN QUALITY:: ADEQUATE

## 2018-11-30 MED ORDER — LEVOFLOXACIN 500 MG PO TABS: 500.0000 mg | ORAL_TABLET | Freq: Every day | ORAL | 0 refills | Status: DC

## 2018-11-30 NOTE — Telephone Encounter (Signed)
Sent in levofloxin in place of bactrim antibiotic.

## 2018-12-02 DIAGNOSIS — G4733 Obstructive sleep apnea (adult) (pediatric): Secondary | ICD-10-CM

## 2018-12-02 DIAGNOSIS — Z9989 Dependence on other enabling machines and devices: Principal | ICD-10-CM

## 2018-12-06 DIAGNOSIS — G4733 Obstructive sleep apnea (adult) (pediatric): Secondary | ICD-10-CM | POA: Diagnosis not present

## 2018-12-08 ENCOUNTER — Encounter: Payer: Self-pay | Admitting: Family Medicine

## 2018-12-08 ENCOUNTER — Ambulatory Visit (INDEPENDENT_AMBULATORY_CARE_PROVIDER_SITE_OTHER): Payer: Medicare HMO | Admitting: Family Medicine

## 2018-12-08 VITALS — BP 126/68 | HR 80 | Temp 98.2°F | Resp 18 | Wt 250.4 lb

## 2018-12-08 DIAGNOSIS — E781 Pure hyperglyceridemia: Secondary | ICD-10-CM | POA: Diagnosis not present

## 2018-12-08 DIAGNOSIS — E785 Hyperlipidemia, unspecified: Secondary | ICD-10-CM

## 2018-12-08 DIAGNOSIS — I10 Essential (primary) hypertension: Secondary | ICD-10-CM

## 2018-12-08 DIAGNOSIS — N289 Disorder of kidney and ureter, unspecified: Secondary | ICD-10-CM

## 2018-12-08 DIAGNOSIS — IMO0002 Reserved for concepts with insufficient information to code with codable children: Secondary | ICD-10-CM

## 2018-12-08 DIAGNOSIS — M1000 Idiopathic gout, unspecified site: Secondary | ICD-10-CM | POA: Diagnosis not present

## 2018-12-08 DIAGNOSIS — E1165 Type 2 diabetes mellitus with hyperglycemia: Secondary | ICD-10-CM

## 2018-12-08 DIAGNOSIS — N390 Urinary tract infection, site not specified: Secondary | ICD-10-CM

## 2018-12-08 DIAGNOSIS — Z794 Long term (current) use of insulin: Secondary | ICD-10-CM

## 2018-12-08 DIAGNOSIS — E1159 Type 2 diabetes mellitus with other circulatory complications: Secondary | ICD-10-CM

## 2018-12-08 NOTE — Assessment & Plan Note (Signed)
Tolerating statin, encouraged heart healthy diet, avoid trans fats, minimize simple carbs and saturated fats. Increase exercise as tolerated 

## 2018-12-08 NOTE — Assessment & Plan Note (Signed)
Encouraged heart healthy diet, increase exercise, avoid trans fats, consider a krill oil cap daily 

## 2018-12-08 NOTE — Assessment & Plan Note (Signed)
minimize simple carbs. Increase exercise as tolerated. Continue current meds  

## 2018-12-08 NOTE — Assessment & Plan Note (Signed)
Check cmp, hydrate

## 2018-12-08 NOTE — Assessment & Plan Note (Signed)
Well controlled, no changes to meds. Encouraged heart healthy diet such as the DASH diet and exercise as tolerated.  °

## 2018-12-08 NOTE — Assessment & Plan Note (Signed)
Hydrate and monitor uric acid 

## 2018-12-08 NOTE — Patient Instructions (Signed)
Warm milk, honey and vanilla with cracker or a cookie produces L tryptophan  Melatonin 2-10 mg to help with sleep Insomnia Insomnia is a sleep disorder that makes it difficult to fall asleep or stay asleep. Insomnia can cause fatigue, low energy, difficulty concentrating, mood swings, and poor performance at work or school. There are three different ways to classify insomnia:  Difficulty falling asleep.  Difficulty staying asleep.  Waking up too early in the morning. Any type of insomnia can be long-term (chronic) or short-term (acute). Both are common. Short-term insomnia usually lasts for three months or less. Chronic insomnia occurs at least three times a week for longer than three months. What are the causes? Insomnia may be caused by another condition, situation, or substance, such as:  Anxiety.  Certain medicines.  Gastroesophageal reflux disease (GERD) or other gastrointestinal conditions.  Asthma or other breathing conditions.  Restless legs syndrome, sleep apnea, or other sleep disorders.  Chronic pain.  Menopause.  Stroke.  Abuse of alcohol, tobacco, or illegal drugs.  Mental health conditions, such as depression.  Caffeine.  Neurological disorders, such as Alzheimer's disease.  An overactive thyroid (hyperthyroidism). Sometimes, the cause of insomnia may not be known. What increases the risk? Risk factors for insomnia include:  Gender. Women are affected more often than men.  Age. Insomnia is more common as you get older.  Stress.  Lack of exercise.  Irregular work schedule or working night shifts.  Traveling between different time zones.  Certain medical and mental health conditions. What are the signs or symptoms? If you have insomnia, the main symptom is having trouble falling asleep or having trouble staying asleep. This may lead to other symptoms, such as:  Feeling fatigued or having low energy.  Feeling nervous about going to  sleep.  Not feeling rested in the morning.  Having trouble concentrating.  Feeling irritable, anxious, or depressed. How is this diagnosed? This condition may be diagnosed based on:  Your symptoms and medical history. Your health care provider may ask about: ? Your sleep habits. ? Any medical conditions you have. ? Your mental health.  A physical exam. How is this treated? Treatment for insomnia depends on the cause. Treatment may focus on treating an underlying condition that is causing insomnia. Treatment may also include:  Medicines to help you sleep.  Counseling or therapy.  Lifestyle adjustments to help you sleep better. Follow these instructions at home: Eating and drinking   Limit or avoid alcohol, caffeinated beverages, and cigarettes, especially close to bedtime. These can disrupt your sleep.  Do not eat a large meal or eat spicy foods right before bedtime. This can lead to digestive discomfort that can make it hard for you to sleep. Sleep habits   Keep a sleep diary to help you and your health care provider figure out what could be causing your insomnia. Write down: ? When you sleep. ? When you wake up during the night. ? How well you sleep. ? How rested you feel the next day. ? Any side effects of medicines you are taking. ? What you eat and drink.  Make your bedroom a dark, comfortable place where it is easy to fall asleep. ? Put up shades or blackout curtains to block light from outside. ? Use a white noise machine to block noise. ? Keep the temperature cool.  Limit screen use before bedtime. This includes: ? Watching TV. ? Using your smartphone, tablet, or computer.  Stick to a routine that includes going  to bed and waking up at the same times every day and night. This can help you fall asleep faster. Consider making a quiet activity, such as reading, part of your nighttime routine.  Try to avoid taking naps during the day so that you sleep better at  night.  Get out of bed if you are still awake after 15 minutes of trying to sleep. Keep the lights down, but try reading or doing a quiet activity. When you feel sleepy, go back to bed. General instructions  Take over-the-counter and prescription medicines only as told by your health care provider.  Exercise regularly, as told by your health care provider. Avoid exercise starting several hours before bedtime.  Use relaxation techniques to manage stress. Ask your health care provider to suggest some techniques that may work well for you. These may include: ? Breathing exercises. ? Routines to release muscle tension. ? Visualizing peaceful scenes.  Make sure that you drive carefully. Avoid driving if you feel very sleepy.  Keep all follow-up visits as told by your health care provider. This is important. Contact a health care provider if:  You are tired throughout the day.  You have trouble in your daily routine due to sleepiness.  You continue to have sleep problems, or your sleep problems get worse. Get help right away if:  You have serious thoughts about hurting yourself or someone else. If you ever feel like you may hurt yourself or others, or have thoughts about taking your own life, get help right away. You can go to your nearest emergency department or call:  Your local emergency services (911 in the [place]  A suicide crisis helpline, such as the [place] at (636)469-1948. This is open 24 hours a day. Summary  Insomnia is a sleep disorder that makes it difficult to fall asleep or stay asleep.  Insomnia can be long-term (chronic) or short-term (acute).  Treatment for insomnia depends on the cause. Treatment may focus on treating an underlying condition that is causing insomnia.  Keep a sleep diary to help you and your health care provider figure out what could be causing your insomnia. This information is not intended to replace advice given  to you by your health care provider. Make sure you discuss any questions you have with your health care provider. Document Released: 09/25/2000 Document Revised: 07/08/2017 Document Reviewed: 07/08/2017 Elsevier Interactive Patient Education  2019 [employer].

## 2018-12-09 ENCOUNTER — Telehealth: Payer: Self-pay | Admitting: Pulmonary Disease

## 2018-12-09 DIAGNOSIS — G4733 Obstructive sleep apnea (adult) (pediatric): Secondary | ICD-10-CM

## 2018-12-09 DIAGNOSIS — Z9989 Dependence on other enabling machines and devices: Principal | ICD-10-CM

## 2018-12-09 NOTE — Telephone Encounter (Signed)
Dr. Ander Slade has reviewed the home sleep test this showed OSA.   Recommendations   Treatment options are CPAP with the settings auto 5 to 15.    Weight loss measures .   Advise against driving while sleepy & against medication with sedative side effects.    Make appointment for 3 months for compliance with download with Dr. Ander Slade.   Called and spoke with patient wife is aware of results and okay to order him a new cpap.

## 2018-12-11 NOTE — Progress Notes (Signed)
Subjective:    Patient ID: William Hendrix, male    DOB: 1941/08/30, 78 y.o.   MRN: [id number]  No chief complaint on file.   HPI Patient is in today for follow up. He feels much better since passing a kidney stone. No polyuria or polydipsia. No dysuria or hematuria. No recent feers or chills. His appetite is good. His bowels are moving well. Denies CP/palp/SOB/HA/congestion/fevers/GI or GU c/o. Taking meds as prescribed  Past Medical History:  Diagnosis Date  . AAA (abdominal aortic aneurysm) ([place]) 12/15/2014   a. Mild aneurysmal dilatation of the infrarenal abdominal aorta 3.2 cm - f/u due by 2019.  . Arthritis    "all over" (07/30/2016)  . CAD (coronary artery disease)    a. stent to LAD 2000. b. possible spasm by cath 2001. c. IVUS/PTCA/DES to mLAD 05/2013. d. PTCA/DES of dRCA into ostial rPDA 07/2013. e. PTCA of OM2, DES to Cx, DES to [facility] 07/2016.  Marland Kitchen Chronic bronchitis ([place])   . Chronic diastolic CHF (congestive heart failure) ([place])   . Chronic lower back pain   . Chronic neck pain   . CKD (chronic kidney disease), stage III ([place])   . Diabetic peripheral neuropathy ([place])   . Ejection fraction    55%, 07/2010, mild inferior hypo  . GERD (gastroesophageal reflux disease)   . Gout   . H/O diverticulitis of colon 01/31/2015  . H/O hiatal hernia   . History of blood transfusion ~ 10/1940   "had pneumonia"  . History of kidney stones   . HTN (hypertension)   . Hyperlipidemia   . Melanoma of forearm, left ([place]) 02/2011   with wide excision   . Myocardial infarction ([place])   . Nephrolithiasis   . Obesity   . OSA on CPAP     [name] since 2000  . Peripheral neuropathy    both feet  . Positive D-dimer    a. significant elevation ,hospital 07/2010, etiology unclear. b. D Dimer chronically > 20.  Marland Kitchen Renal insufficiency   . Skin cancer   . Spinal stenosis    a. s/p surgical repair 2013  . Spinal stenosis of lumbar region   . Type II diabetes mellitus ([place])   . Ventral hernia      Past Surgical History:  Procedure Laterality Date  . ANTERIOR LAT LUMBAR FUSION  10/22/2017   Procedure: Lumbar two-three Lumbar three-four Lumbar four-five Anteriolateral lumbar interbody fusion with percutaneous pedicle screw fixation and infuse;  Surgeon: [name], MD;  Location: [place];  Service: Neurosurgery;;  . APPLICATION OF ROBOTIC ASSISTANCE FOR SPINAL PROCEDURE  10/22/2017   Procedure: APPLICATION OF ROBOTIC ASSISTANCE FOR SPINAL PROCEDURE;  Surgeon: [name], MD;  Location: [place];  Service: Neurosurgery;;  . BACK SURGERY    . CARDIAC CATHETERIZATION N/A 07/30/2016   Procedure: Left Heart Cath and Coronary Angiography;  Surgeon: Nelva Bush, MD;  Location: [place] CV LAB;  Service: Cardiovascular;  Laterality: N/A;  . CARDIAC CATHETERIZATION N/A 07/30/2016   Procedure: Coronary Stent Intervention;  Surgeon: Nelva Bush, MD;  Location: [place] CV LAB;  Service: Cardiovascular;  Laterality: N/A;  Mid CFX and MID RCA  . CARDIAC CATHETERIZATION N/A 07/30/2016   Procedure: Coronary Balloon Angioplasty;  Surgeon: Nelva Bush, MD;  Location: [place] CV LAB;  Service: Cardiovascular;  Laterality: N/A;  OM 1  . CARPAL TUNNEL RELEASE Left   . CATARACT EXTRACTION W/ INTRAOCULAR LENS  IMPLANT, BILATERAL Bilateral ~ 2014  . CERVICAL DISC  SURGERY  1990s  . CORONARY ANGIOPLASTY WITH STENT PLACEMENT  05/2013  . CORONARY ANGIOPLASTY WITH STENT PLACEMENT  07/26/2013   DES to RCA extending to PDA    . CORONARY ANGIOPLASTY WITH STENT PLACEMENT  07/30/2016  . CORONARY ANGIOPLASTY WITH STENT PLACEMENT  2000   CAD  . DENTAL SURGERY  04/2016   "got infected; had to dig it out"  . EYE SURGERY    . KNEE ARTHROSCOPY Right 1990's   rt  . LEFT AND RIGHT HEART CATHETERIZATION WITH CORONARY ANGIOGRAM N/A 07/26/2013   Procedure: LEFT AND RIGHT HEART CATHETERIZATION WITH CORONARY ANGIOGRAM;  Surgeon: [name], MD;  Location: [place] CATH LAB;  Service: Cardiovascular;   Laterality: N/A;  . LEFT HEART CATH AND CORONARY ANGIOGRAPHY N/A 02/26/2017   Procedure: Left Heart Cath and Coronary Angiography;  Surgeon: Sherren Mocha, MD;  Location: [place] CV LAB;  Service: Cardiovascular;  Laterality: N/A;  . LEFT HEART CATHETERIZATION WITH CORONARY ANGIOGRAM N/A 05/19/2013   Procedure: LEFT HEART CATHETERIZATION WITH CORONARY ANGIOGRAM;  Surgeon: [name], MD;  Location: [facility] CATH LAB;  Service: Cardiovascular;  Laterality: N/A;  . LUMBAR LAMINECTOMY/DECOMPRESSION MICRODISCECTOMY  08/30/2012   Procedure: LUMBAR LAMINECTOMY/DECOMPRESSION MICRODISCECTOMY 2 LEVELS;  Surgeon: [name], MD;  Location: [place] NEURO ORS;  Service: Neurosurgery;  Laterality: Bilateral;  Bilateral Lumbar three-four Lumbar four-five Laminotomies  . LUMBAR PERCUTANEOUS PEDICLE SCREW 1 LEVEL Bilateral 10/22/2017   Procedure: LUMBAR PERCUTANEOUS PEDICLE SCREW 1 LEVEL;  Surgeon: [name], MD;  Location: [place];  Service: Neurosurgery;  Laterality: Bilateral;  . LUMBAR SPINE SURGERY  04/07/2016   Dr. Ellene Route; "?ruptured disc"  . MELANOMA EXCISION Left 02/2011   forearm  . NASAL SINUS SURGERY  1970s   "cut windows in sinus pockets"  . PERCUTANEOUS CORONARY STENT INTERVENTION (PCI-S)  05/19/2013   Procedure: PERCUTANEOUS CORONARY STENT INTERVENTION (PCI-S);  Surgeon: [name], MD;  Location: [facility] CATH LAB;  Service: Cardiovascular;;  . ULNAR TUNNEL RELEASE Left 04/07/2016   Dr. Ellene Route    Family History  Problem Relation Age of Onset  . Cancer Mother        intestinal   . Heart disease Mother   . Cancer Father        prostate  . Migraines Daughter   . Leukemia Sister   . Heart disease Sister   . Prostate cancer Other   . Kidney cancer Other   . Cancer Other        Bladder cancer  . Coronary artery disease Other   . Hypertension Sister   . Hypertension Brother   . Heart attack Neg Hx   . Stroke Neg Hx     Social History   Socioeconomic History  . Marital status:  Married    Spouse name: Not on file  . Number of children: Not on file  . Years of education: Not on file  . Highest education level: Not on file  Occupational History  . Occupation: [employer]  . Financial resource strain: Not on file  . Food insecurity:    Worry: Not on file    Inability: Not on file  . Transportation needs:    Medical: Not on file    Non-medical: Not on file  Tobacco Use  . Smoking status: Former Smoker    Packs/day: 3.00    Years: 30.00    Pack years: 90.00    Types: Cigarettes    Last attempt to quit: 09/17/1996  Years since quitting: 22.2  . Smokeless tobacco: Never Used  Substance and Sexual Activity  . Alcohol use: No  . Drug use: No  . Sexual activity: Not Currently    Birth control/protection: None  Lifestyle  . Physical activity:    Days per week: Not on file    Minutes per session: Not on file  . Stress: Not on file  Relationships  . Social connections:    Talks on phone: Not on file    Gets together: Not on file    Attends religious service: Not on file    Active member of club or organization: Not on file    Attends meetings of clubs or organizations: Not on file    Relationship status: Not on file  . Intimate partner violence:    Fear of current or ex partner: Not on file    Emotionally abused: Not on file    Physically abused: Not on file    Forced sexual activity: Not on file  Other Topics Concern  . Not on file  Social History Narrative   Married and lives locally with his wife.  Sharyon Cable.    Outpatient Medications Prior to Visit  Medication Sig Dispense Refill  . acetaminophen (TYLENOL) 500 MG tablet Take 500 mg by mouth 2 (two) times daily.     Marland Kitchen albuterol (PROVENTIL HFA;VENTOLIN HFA) 108 (90 Base) MCG/ACT inhaler Inhale 2 puffs into the lungs every 6 (six) hours as needed for wheezing or shortness of breath. 1 Inhaler 0  . allopurinol (ZYLOPRIM) 300 MG tablet Take 1 tablet (300 mg total) by mouth daily. 90  tablet 1  . aspirin EC 81 MG tablet Take 81 mg by mouth daily.    Marland Kitchen atorvastatin (LIPITOR) 40 MG tablet Take 1 tablet (40 mg total) by mouth daily. 90 tablet 1  . B Complex Vitamins (B COMPLEX-B12 PO) Take 1 tablet by mouth once a week.     . BD INSULIN SYRINGE ULTRAFINE 31G X 5/16" 1 ML MISC USE  TO INJECT  INSULIN  FIVE  TIMES  DAILY AS INSTRUCTED 450 each 3  . Cholecalciferol (VITAMIN D3) 25 MCG (1000 UT) CAPS Take 1,000 Units by mouth at bedtime.    . fenofibrate 160 MG tablet Take 1 tablet (160 mg total) by mouth at bedtime. 90 tablet 1  . fluocinonide cream (LIDEX) 8.29 % Apply 1 application topically daily as needed (for rashes).     . fluticasone (FLONASE) 50 MCG/ACT nasal spray Place 2 sprays into both nostrils daily. (Patient taking differently: Place 2 sprays into both nostrils daily as needed for allergies or rhinitis. ) 16 g 1  . furosemide (LASIX) 40 MG tablet TAKE 1 AND 1/2 TABLETS EVERY DAY 135 tablet 3  . glucose blood (TRUE METRIX BLOOD GLUCOSE TEST) test strip USE TO TEST BLOOD SUGAR THREE TIMES DAILY AS DIRECTED 300 each 3  . HYDROcodone-acetaminophen (NORCO) 10-325 MG tablet Take 0.5 tablets by mouth 2 (two) times daily.  0  . insulin NPH Human (NOVOLIN N) 100 UNIT/ML injection Inject 45 units in am and 40 in the evening (Patient taking differently: Inject 45-50 Units into the skin See admin instructions. Inject 45 units into the skin before breakfast and 50 units before supper/evening meal) 30 mL 3  . insulin regular (NOVOLIN R RELION) 100 units/mL injection Inject 0.25-0.3 mLs (25-30 Units total) into the skin 3 (three) times daily before meals. (Patient taking differently: Inject 35 Units into the skin 3 (three) times  daily before meals. ) 70 mL 3  . isosorbide mononitrate (IMDUR) 30 MG 24 hr tablet Take 1 tablet (30 mg total) by mouth daily. 90 tablet 3  . levofloxacin (LEVAQUIN) 500 MG tablet Take 1 tablet (500 mg total) by mouth daily. 10 tablet 0  . MAGNESIUM PO Take 1  tablet by mouth at bedtime.    . metoprolol tartrate (LOPRESSOR) 50 MG tablet Take 0.5 tablets (25 mg total) by mouth 2 (two) times daily.    . nitroGLYCERIN (NITROSTAT) 0.4 MG SL tablet Place 1 tablet (0.4 mg total) under the tongue every 5 (five) minutes as needed for chest pain. 100 tablet 3  . oxybutynin (DITROPAN) 5 MG tablet Take 1 tablet (5 mg total) by mouth 2 (two) times daily. 60 tablet 1  . pantoprazole (PROTONIX) 20 MG tablet Take 1 tablet (20 mg total) by mouth daily. (Patient taking differently: Take 20 mg by mouth at bedtime. ) 90 tablet 3  . Probiotic Product (PROBIOTIC PO) Take 1 capsule by mouth at bedtime.     . tamsulosin (FLOMAX) 0.4 MG CAPS capsule Take 1 capsule (0.4 mg total) by mouth daily. 90 capsule 1  . tiZANidine (ZANAFLEX) 4 MG tablet Take 4 mg by mouth 2 (two) times daily.  6   No facility-administered medications prior to visit.     Allergies  Allergen Reactions  . Brilinta [Ticagrelor] Shortness Of Breath  . Levemir [Insulin Detemir] Swelling and Other (See Comments)    Patient had redness and swelling and tenderness at injection site.  . Codeine Other (See Comments)    Makes him "shakey", is OK with hydrocodone GI UPSET & TREMORS  . Lipitor [Atorvastatin] Other (See Comments)    Muscle aches; affects liver function- Patient is currently taking  . Novolog Mix [Insulin Aspart Prot & Aspart] Other (See Comments)    Causes skin to swell/itch at injection site    Review of Systems  Constitutional: Negative for fever and malaise/fatigue.  HENT: Negative for congestion.   Eyes: Negative for blurred vision.  Respiratory: Negative for shortness of breath.   Cardiovascular: Negative for chest pain, palpitations and leg swelling.  Gastrointestinal: Negative for abdominal pain, blood in stool and nausea.  Genitourinary: Negative for dysuria and frequency.  Musculoskeletal: Negative for falls.  Skin: Negative for rash.  Neurological: Negative for dizziness,  loss of consciousness and headaches.  Endo/Heme/Allergies: Negative for environmental allergies.  Psychiatric/Behavioral: Negative for depression. The patient is not nervous/anxious.        Objective:    Physical Exam Vitals signs and nursing note reviewed.  Constitutional:      General: He is not in acute distress.    Appearance: He is well-developed.  HENT:     Head: Normocephalic and atraumatic.     Nose: Nose normal.  Eyes:     General:        Right eye: No discharge.        Left eye: No discharge.  Neck:     Musculoskeletal: Normal range of motion and neck supple.  Cardiovascular:     Rate and Rhythm: Normal rate and regular rhythm.     Heart sounds: No murmur.  Pulmonary:     Effort: Pulmonary effort is normal.     Breath sounds: Normal breath sounds.  Abdominal:     General: Bowel sounds are normal.     Palpations: Abdomen is soft.     Tenderness: There is no abdominal tenderness.  Skin:  General: Skin is warm and dry.  Neurological:     Mental Status: He is alert and oriented to person, place, and time.     BP 126/68 (BP Location: Left Arm, Patient Position: Sitting, Cuff Size: Large)   Pulse 80   Temp 98.2 F (36.8 C) (Oral)   Resp 18   Wt 250 lb 6.4 oz (113.6 kg)   SpO2 97%   BMI 35.42 kg/m  Wt Readings from Last 3 Encounters:  12/08/18 250 lb 6.4 oz (113.6 kg)  11/28/18 256 lb 1 oz (116.1 kg)  11/17/18 255 lb (115.7 kg)     Lab Results  Component Value Date   WBC 6.3 11/11/2018   HGB 12.7 (L) 11/11/2018   HCT 39.3 11/11/2018   PLT 175 11/11/2018   GLUCOSE 107 (H) 11/14/2018   CHOL 134 09/16/2018   TRIG 213 (H) 09/16/2018   HDL 25 (L) 09/16/2018   LDLDIRECT 69.0 01/24/2018   LDLCALC 66 09/16/2018   ALT 23 11/14/2018   AST 25 11/14/2018   NA 137 11/14/2018   K 4.6 11/14/2018   CL 102 11/14/2018   CREATININE 1.90 (H) 11/14/2018   BUN 31 (H) 11/14/2018   CO2 28 11/14/2018   TSH 3.39 01/24/2018   PSA 1.01 11/02/2018   INR 1.1  02/24/2017   HGBA1C 6.1 (A) 05/10/2018   MICROALBUR 0.7 08/02/2014    Lab Results  Component Value Date   TSH 3.39 01/24/2018   Lab Results  Component Value Date   WBC 6.3 11/11/2018   HGB 12.7 (L) 11/11/2018   HCT 39.3 11/11/2018   MCV 94.9 11/11/2018   PLT 175 11/11/2018   Lab Results  Component Value Date   NA 137 11/14/2018   K 4.6 11/14/2018   CO2 28 11/14/2018   GLUCOSE 107 (H) 11/14/2018   BUN 31 (H) 11/14/2018   CREATININE 1.90 (H) 11/14/2018   BILITOT 0.5 11/14/2018   ALKPHOS 56 11/14/2018   AST 25 11/14/2018   ALT 23 11/14/2018   PROT 7.1 11/14/2018   ALBUMIN 4.4 11/14/2018   CALCIUM 10.5 11/14/2018   ANIONGAP 8 11/12/2018   GFR 34.40 (L) 11/14/2018   Lab Results  Component Value Date   CHOL 134 09/16/2018   Lab Results  Component Value Date   HDL 25 (L) 09/16/2018   Lab Results  Component Value Date   LDLCALC 66 09/16/2018   Lab Results  Component Value Date   TRIG 213 (H) 09/16/2018   Lab Results  Component Value Date   CHOLHDL 5.4 09/16/2018   Lab Results  Component Value Date   HGBA1C 6.1 (A) 05/10/2018       Assessment & Plan:   Problem List Items Addressed This Visit    Gout    Hydrate and monitor uric acid      Relevant Orders   Uric acid   Renal insufficiency    Check cmp, hydrate      Hyperlipidemia    Tolerating statin, encouraged heart healthy diet, avoid trans fats, minimize simple carbs and saturated fats. Increase exercise as tolerated      Relevant Orders   Lipid panel   Essential hypertension    Well controlled, no changes to meds. Encouraged heart healthy diet such as the DASH diet and exercise as tolerated.       Relevant Orders   CBC   Comprehensive metabolic panel   TSH   Hypertriglyceridemia    Encouraged heart healthy diet, increase exercise, avoid trans  fats, consider a krill oil cap daily      Uncontrolled type 2 diabetes mellitus with circulatory disorder, with long-term current use of  insulin (HCC)    minimize simple carbs. Increase exercise as tolerated. Continue current meds      Relevant Orders   Hemoglobin A1c   CBC   Comprehensive metabolic panel   Lipid panel   TSH   Uric acid   Urinalysis   Urine Culture   UTI (urinary tract infection) - Primary    Feeling much better since passing his kidney stones. Repeat urine studies.       Relevant Orders   Urinalysis   Urine Culture      I am having Elyn Aquas. Lorain Childes "[name]" maintain his fluocinonide cream, aspirin EC, acetaminophen, B Complex Vitamins (B COMPLEX-B12 PO), albuterol, BD INSULIN SYRINGE ULTRAFINE, fluticasone, insulin regular, Probiotic Product (PROBIOTIC PO), nitroGLYCERIN, insulin NPH Human, fenofibrate, allopurinol, atorvastatin, pantoprazole, glucose blood, tiZANidine, Vitamin D3, MAGNESIUM PO, HYDROcodone-acetaminophen, isosorbide mononitrate, tamsulosin, oxybutynin, furosemide, metoprolol tartrate, and levofloxacin.  No orders of the defined types were placed in this encounter.    Penni Homans, MD

## 2018-12-11 NOTE — Assessment & Plan Note (Signed)
Feeling much better since passing his kidney stones. Repeat urine studies.

## 2018-12-15 ENCOUNTER — Other Ambulatory Visit (INDEPENDENT_AMBULATORY_CARE_PROVIDER_SITE_OTHER): Payer: Medicare HMO

## 2018-12-15 DIAGNOSIS — E785 Hyperlipidemia, unspecified: Secondary | ICD-10-CM | POA: Diagnosis not present

## 2018-12-15 DIAGNOSIS — M1000 Idiopathic gout, unspecified site: Secondary | ICD-10-CM | POA: Diagnosis not present

## 2018-12-15 DIAGNOSIS — E1159 Type 2 diabetes mellitus with other circulatory complications: Secondary | ICD-10-CM

## 2018-12-15 DIAGNOSIS — Z794 Long term (current) use of insulin: Secondary | ICD-10-CM | POA: Diagnosis not present

## 2018-12-15 DIAGNOSIS — I10 Essential (primary) hypertension: Secondary | ICD-10-CM | POA: Diagnosis not present

## 2018-12-15 DIAGNOSIS — E1165 Type 2 diabetes mellitus with hyperglycemia: Secondary | ICD-10-CM

## 2018-12-15 DIAGNOSIS — N390 Urinary tract infection, site not specified: Secondary | ICD-10-CM | POA: Diagnosis not present

## 2018-12-15 DIAGNOSIS — IMO0002 Reserved for concepts with insufficient information to code with codable children: Secondary | ICD-10-CM

## 2018-12-16 LAB — COMPREHENSIVE METABOLIC PANEL
ALT: 15 U/L (ref 0–53)
AST: 19 U/L (ref 0–37)
Albumin: 4 g/dL (ref 3.5–5.2)
Alkaline Phosphatase: 52 U/L (ref 39–117)
BUN: 27 mg/dL — ABNORMAL HIGH (ref 6–23)
CO2: 31 mEq/L (ref 19–32)
Calcium: 9.9 mg/dL (ref 8.4–10.5)
Chloride: 103 mEq/L (ref 96–112)
Creatinine, Ser: 1.77 mg/dL — ABNORMAL HIGH (ref 0.40–1.50)
GFR: 37.32 mL/min — ABNORMAL LOW (ref >60.00)
Glucose, Bld: 69 mg/dL — ABNORMAL LOW (ref 70–99)
POTASSIUM: 4 meq/L (ref 3.5–5.1)
Sodium: 142 mEq/L (ref 135–145)
Total Bilirubin: 0.4 mg/dL (ref 0.2–1.2)
Total Protein: 6.5 g/dL (ref 6.0–8.3)

## 2018-12-16 LAB — URINALYSIS
Bilirubin Urine: NEGATIVE
Hgb urine dipstick: NEGATIVE
Ketones, ur: NEGATIVE
Leukocytes,Ua: NEGATIVE
Nitrite: NEGATIVE
PH: 5.5 (ref 5.0–8.0)
Specific Gravity, Urine: 1.02 (ref 1.000–1.030)
TOTAL PROTEIN, URINE-UPE24: NEGATIVE
UROBILINOGEN UA: 0.2 (ref 0.0–1.0)
Urine Glucose: NEGATIVE

## 2018-12-16 LAB — TSH: TSH: 2.41 u[IU]/mL (ref 0.35–4.50)

## 2018-12-16 LAB — CBC
HCT: 37.4 % — ABNORMAL LOW (ref 39.0–52.0)
HEMOGLOBIN: 12.6 g/dL — AB (ref 13.0–17.0)
MCHC: 33.7 g/dL (ref 30.0–36.0)
MCV: 92.7 fl (ref 78.0–100.0)
Platelets: 224 10*3/uL (ref 150.0–400.0)
RBC: 4.04 Mil/uL — AB (ref 4.22–5.81)
RDW: 15.1 % (ref 11.5–15.5)
WBC: 7.2 10*3/uL (ref 4.0–10.5)

## 2018-12-16 LAB — LIPID PANEL
Cholesterol: 104 mg/dL (ref 0–200)
HDL: 26 mg/dL — ABNORMAL LOW (ref >39.00)
LDL Cholesterol: 38 mg/dL (ref 0–99)
NonHDL: 77.84
Total CHOL/HDL Ratio: 4
Triglycerides: 197 mg/dL — ABNORMAL HIGH (ref 0.0–149.0)
VLDL: 39.4 mg/dL (ref 0.0–40.0)

## 2018-12-16 LAB — URINE CULTURE
MICRO NUMBER:: 281570
Result:: NO GROWTH
SPECIMEN QUALITY:: ADEQUATE

## 2018-12-16 LAB — HEMOGLOBIN A1C: Hgb A1c MFr Bld: 6.8 % — ABNORMAL HIGH (ref 4.6–6.5)

## 2018-12-16 LAB — URIC ACID: Uric Acid, Serum: 4.3 mg/dL (ref 4.0–7.8)

## 2018-12-22 DIAGNOSIS — M109 Gout, unspecified: Secondary | ICD-10-CM | POA: Diagnosis not present

## 2018-12-22 DIAGNOSIS — Z87442 Personal history of urinary calculi: Secondary | ICD-10-CM | POA: Diagnosis not present

## 2018-12-22 DIAGNOSIS — R3912 Poor urinary stream: Secondary | ICD-10-CM | POA: Diagnosis not present

## 2018-12-22 DIAGNOSIS — N401 Enlarged prostate with lower urinary tract symptoms: Secondary | ICD-10-CM | POA: Diagnosis not present

## 2018-12-28 ENCOUNTER — Ambulatory Visit: Payer: Medicare HMO | Admitting: Internal Medicine

## 2018-12-28 ENCOUNTER — Other Ambulatory Visit: Payer: Self-pay

## 2018-12-28 ENCOUNTER — Encounter: Payer: Self-pay | Admitting: Internal Medicine

## 2018-12-28 VITALS — BP 138/60 | HR 68 | Temp 97.6°F | Ht 70.5 in | Wt 254.3 lb

## 2018-12-28 DIAGNOSIS — IMO0002 Reserved for concepts with insufficient information to code with codable children: Secondary | ICD-10-CM

## 2018-12-28 DIAGNOSIS — E1165 Type 2 diabetes mellitus with hyperglycemia: Secondary | ICD-10-CM | POA: Diagnosis not present

## 2018-12-28 DIAGNOSIS — Z6837 Body mass index (BMI) 37.0-37.9, adult: Secondary | ICD-10-CM

## 2018-12-28 DIAGNOSIS — E1159 Type 2 diabetes mellitus with other circulatory complications: Secondary | ICD-10-CM

## 2018-12-28 DIAGNOSIS — E785 Hyperlipidemia, unspecified: Secondary | ICD-10-CM | POA: Diagnosis not present

## 2018-12-28 DIAGNOSIS — Z794 Long term (current) use of insulin: Secondary | ICD-10-CM | POA: Diagnosis not present

## 2018-12-28 NOTE — Patient Instructions (Addendum)
Please continue: Insulin Before breakfast Before lunch Before dinner  Regular 35 30-35 35 (if sugars <100, take 15 units)  NPH 45  50   If you take dinnertime insulin after dinner, take 50% of the dose.  Please return in 4 months with your sugar log.

## 2018-12-28 NOTE — Progress Notes (Signed)
Patient ID: William Hendrix, male   DOB: 10-05-1941, 78 y.o.   MRN: [id number]  HPI: William Hendrix is a 78 y.o.-year-old male, returning for f/u for DM2, dx 2009, insulin-dependent, uncontrolled, with complications (CAD s/p AMI 2017, CKD stage 3). Last visit 79months ago.  Since last visit, he had a kidney stone >> recurrent UTIs >> now passed the stone >> resolved. Sugars are higher.  At the end of 2018, he eliminated meat almost completely and his sugars improved significantly, while he also lost weight.  He continues to eat a mostly plant-based diet.  Last hemoglobin A1c was: Lab Results  Component Value Date   HGBA1C 6.8 (H) 12/15/2018   HGBA1C 6.1 (A) 05/10/2018   HGBA1C 5.7 01/06/2018   He is on: ReliOn insulin: Insulin Before breakfast Before lunch Before dinner  Regular 35 30-35 35 (if sugars <100, take 15 units)  NPH 45  50   Please return in 4 months with your sugar log. We cannot use Metformin 2/2 CKD. We stopped Januvia >> could not afford ($100/3 mo)  We cannot use Levemir >> developed a severe skin reaction We stopped Amaryl 4 mg.  He checks sugars 3 times a day: - am: 84-164, 200 >> 80-147 >> 72-144, 159 - Before lunch: 69, 81-169, 214 >> 80-160s >> 51, 104-136, 167 - before dinner: 60, 62-156, 201 >> 70-135 >> 61, 71-169 - Before bedtime: 89-169 >> 57, 120-145 >> 180-201 Lowest sugar was  60 >> 50s >>  51; it is unclear at which level he has hypoglycemia awareness. Highest sugar was  214 >> 293 (steroids) >> 201.  Meals: - Breakfast: oatmeal + toast + tomatoes - Lunch: beans  - Dinner:home cooked veggies   + CKD, last BUN/creatinine was:  Lab Results  Component Value Date   BUN 27 (H) 12/15/2018   CREATININE 1.77 (H) 12/15/2018   Latest ACR was normal: Lab Results  Component Value Date   MICRALBCREAT 5.1 08/02/2014   MICRALBCREAT 0.7 02/23/2013   Latest GFR: Lab Results  Component Value Date   GFRAA 39 (L) 11/12/2018   GFRAA 32 (L) 11/11/2018    GFRAA 44 (L) 09/16/2018   GFRAA 43 (L) 09/15/2018   GFRAA 46 (L) 08/21/2018   GFRAA 45 (L) 07/25/2018   GFRAA 39 (L) 04/23/2018   GFRAA 58 (L) 10/23/2017   GFRAA 53 (L) 10/20/2017   GFRAA 51 (L) 07/15/2017   + HL; latest lipids: Lab Results  Component Value Date   CHOL 104 12/15/2018   HDL 26.00 (L) 12/15/2018   LDLCALC 38 12/15/2018   LDLDIRECT 69.0 01/24/2018   TRIG 197.0 (H) 12/15/2018   CHOLHDL 4 12/15/2018  He is on Lipitor, fenofibrate, flax oil. Pt's last eye exam was 09/2015: No DR. Had cataract sx in 2011. He denies numbness and tingling in his legs.  He had back surgery in 03/2016 and 10/2017.  He developed a kidney stone after his last surgery. He was admitted 2x in 2017 for: CP/SOB >> AMI.  He had 2 stents placed then. He had a cardiac cath 02/2017 >> no new blockages  PMH: Pt also also has a history of CAD, HTN, CKD stage III, history of nephrolithiasis, obesity, hiatal hernia, OSA, melanoma, spinal stenosis.  ROS: Constitutional: + weight gain/no weight loss, no fatigue, no subjective hyperthermia, no subjective hypothermia Eyes: no blurry vision, no xerophthalmia ENT: no sore throat, no nodules palpated in neck, no dysphagia, no odynophagia, no hoarseness Cardiovascular: no CP/no SOB/no palpitations/no  leg swelling Respiratory: no cough/no SOB/no wheezing Gastrointestinal: no N/no V/no D/no C/no acid reflux Musculoskeletal: no muscle aches/no joint aches Skin: no rashes, no hair loss Neurological: no tremors/no numbness/no tingling/no dizziness  I reviewed pt's medications, allergies, PMH, social hx, family hx, and changes were documented in the history of present illness. Otherwise, unchanged from my initial visit note.  Past Medical History:  Diagnosis Date  . AAA (abdominal aortic aneurysm) ([place]) 12/15/2014   a. Mild aneurysmal dilatation of the infrarenal abdominal aorta 3.2 cm - f/u due by 2019.  . Arthritis    "all over" (07/30/2016)  . CAD  (coronary artery disease)    a. stent to LAD 2000. b. possible spasm by cath 2001. c. IVUS/PTCA/DES to mLAD 05/2013. d. PTCA/DES of dRCA into ostial rPDA 07/2013. e. PTCA of OM2, DES to Cx, DES to [facility] 07/2016.  Marland Kitchen Chronic bronchitis ([place])   . Chronic diastolic CHF (congestive heart failure) ([place])   . Chronic lower back pain   . Chronic neck pain   . CKD (chronic kidney disease), stage III ([place])   . Diabetic peripheral neuropathy ([place])   . Ejection fraction    55%, 07/2010, mild inferior hypo  . GERD (gastroesophageal reflux disease)   . Gout   . H/O diverticulitis of colon 01/31/2015  . H/O hiatal hernia   . History of blood transfusion ~ 10/1940   "had pneumonia"  . History of kidney stones   . HTN (hypertension)   . Hyperlipidemia   . Melanoma of forearm, left ([place]) 02/2011   with wide excision   . Myocardial infarction ([place])   . Nephrolithiasis   . Obesity   . OSA on CPAP     [name] since 2000  . Peripheral neuropathy    both feet  . Positive D-dimer    a. significant elevation ,hospital 07/2010, etiology unclear. b. D Dimer chronically > 20.  Marland Kitchen Renal insufficiency   . Skin cancer   . Spinal stenosis    a. s/p surgical repair 2013  . Spinal stenosis of lumbar region   . Type II diabetes mellitus ([place])   . Ventral hernia    Past Surgical History:  Procedure Laterality Date  . ANTERIOR LAT LUMBAR FUSION  10/22/2017   Procedure: Lumbar two-three Lumbar three-four Lumbar four-five Anteriolateral lumbar interbody fusion with percutaneous pedicle screw fixation and infuse;  Surgeon: [name], MD;  Location: [facility];  Service: Neurosurgery;;  . APPLICATION OF ROBOTIC ASSISTANCE FOR SPINAL PROCEDURE  10/22/2017   Procedure: APPLICATION OF ROBOTIC ASSISTANCE FOR SPINAL PROCEDURE;  Surgeon: [name], MD;  Location: [place];  Service: Neurosurgery;;  . BACK SURGERY    . CARDIAC CATHETERIZATION N/A 07/30/2016   Procedure: Left Heart Cath and Coronary Angiography;  Surgeon:  Nelva Bush, MD;  Location: [name] CV LAB;  Service: Cardiovascular;  Laterality: N/A;  . CARDIAC CATHETERIZATION N/A 07/30/2016   Procedure: Coronary Stent Intervention;  Surgeon: Nelva Bush, MD;  Location: [place] CV LAB;  Service: Cardiovascular;  Laterality: N/A;  Mid CFX and MID RCA  . CARDIAC CATHETERIZATION N/A 07/30/2016   Procedure: Coronary Balloon Angioplasty;  Surgeon: Nelva Bush, MD;  Location: [place] CV LAB;  Service: Cardiovascular;  Laterality: N/A;  OM 1  . CARPAL TUNNEL RELEASE Left   . CATARACT EXTRACTION W/ INTRAOCULAR LENS  IMPLANT, BILATERAL Bilateral ~ 2014  . CERVICAL DISC SURGERY  1990s  . CORONARY ANGIOPLASTY WITH STENT PLACEMENT  05/2013  . CORONARY ANGIOPLASTY WITH  STENT PLACEMENT  07/26/2013   DES to RCA extending to PDA    . CORONARY ANGIOPLASTY WITH STENT PLACEMENT  07/30/2016  . CORONARY ANGIOPLASTY WITH STENT PLACEMENT  2000   CAD  . DENTAL SURGERY  04/2016   "got infected; had to dig it out"  . EYE SURGERY    . KNEE ARTHROSCOPY Right 1990's   rt  . LEFT AND RIGHT HEART CATHETERIZATION WITH CORONARY ANGIOGRAM N/A 07/26/2013   Procedure: LEFT AND RIGHT HEART CATHETERIZATION WITH CORONARY ANGIOGRAM;  Surgeon: [name], MD;  Location: [place] CATH LAB;  Service: Cardiovascular;  Laterality: N/A;  . LEFT HEART CATH AND CORONARY ANGIOGRAPHY N/A 02/26/2017   Procedure: Left Heart Cath and Coronary Angiography;  Surgeon: Sherren Mocha, MD;  Location: [place] CV LAB;  Service: Cardiovascular;  Laterality: N/A;  . LEFT HEART CATHETERIZATION WITH CORONARY ANGIOGRAM N/A 05/19/2013   Procedure: LEFT HEART CATHETERIZATION WITH CORONARY ANGIOGRAM;  Surgeon: [name], MD;  Location: [facility] CATH LAB;  Service: Cardiovascular;  Laterality: N/A;  . LUMBAR LAMINECTOMY/DECOMPRESSION MICRODISCECTOMY  08/30/2012   Procedure: LUMBAR LAMINECTOMY/DECOMPRESSION MICRODISCECTOMY 2 LEVELS;  Surgeon: [name], MD;  Location: [place] NEURO ORS;   Service: Neurosurgery;  Laterality: Bilateral;  Bilateral Lumbar three-four Lumbar four-five Laminotomies  . LUMBAR PERCUTANEOUS PEDICLE SCREW 1 LEVEL Bilateral 10/22/2017   Procedure: LUMBAR PERCUTANEOUS PEDICLE SCREW 1 LEVEL;  Surgeon: [name], MD;  Location: [place];  Service: Neurosurgery;  Laterality: Bilateral;  . LUMBAR SPINE SURGERY  04/07/2016   Dr. Ellene Route; "?ruptured disc"  . MELANOMA EXCISION Left 02/2011   forearm  . NASAL SINUS SURGERY  1970s   "cut windows in sinus pockets"  . PERCUTANEOUS CORONARY STENT INTERVENTION (PCI-S)  05/19/2013   Procedure: PERCUTANEOUS CORONARY STENT INTERVENTION (PCI-S);  Surgeon: [name], MD;  Location: [facility] CATH LAB;  Service: Cardiovascular;;  . ULNAR TUNNEL RELEASE Left 04/07/2016   Dr. Ellene Route   Social History   Socioeconomic History  . Marital status: Married    Spouse name: Not on file  . Number of children: Not on file  . Years of education: Not on file  . Highest education level: Not on file  Occupational History  . Occupation: [employer]  . Financial resource strain: Not on file  . Food insecurity:    Worry: Not on file    Inability: Not on file  . Transportation needs:    Medical: Not on file    Non-medical: Not on file  Tobacco Use  . Smoking status: Former Smoker    Packs/day: 3.00    Years: 30.00    Pack years: 90.00    Types: Cigarettes    Last attempt to quit: 09/17/1996    Years since quitting: 22.2  . Smokeless tobacco: Never Used  Substance and Sexual Activity  . Alcohol use: No  . Drug use: No  . Sexual activity: Not Currently    Birth control/protection: None  Lifestyle  . Physical activity:    Days per week: Not on file    Minutes per session: Not on file  . Stress: Not on file  Relationships  . Social connections:    Talks on phone: Not on file    Gets together: Not on file    Attends religious service: Not on file    Active member of club or organization: Not on file    Attends  meetings of clubs or organizations: Not on file    Relationship status: Not on file  .  Intimate partner violence:    Fear of current or ex partner: Not on file    Emotionally abused: Not on file    Physically abused: Not on file    Forced sexual activity: Not on file  Other Topics Concern  . Not on file  Social History Narrative   Married and lives locally with his wife.  Sharyon Cable.   Current Outpatient Medications on File Prior to Visit  Medication Sig Dispense Refill  . acetaminophen (TYLENOL) 500 MG tablet Take 500 mg by mouth 2 (two) times daily.     Marland Kitchen albuterol (PROVENTIL HFA;VENTOLIN HFA) 108 (90 Base) MCG/ACT inhaler Inhale 2 puffs into the lungs every 6 (six) hours as needed for wheezing or shortness of breath. 1 Inhaler 0  . allopurinol (ZYLOPRIM) 300 MG tablet Take 1 tablet (300 mg total) by mouth daily. 90 tablet 1  . aspirin EC 81 MG tablet Take 81 mg by mouth daily.    Marland Kitchen atorvastatin (LIPITOR) 40 MG tablet Take 1 tablet (40 mg total) by mouth daily. 90 tablet 1  . B Complex Vitamins (B COMPLEX-B12 PO) Take 1 tablet by mouth once a week.     . BD INSULIN SYRINGE ULTRAFINE 31G X 5/16" 1 ML MISC USE  TO INJECT  INSULIN  FIVE  TIMES  DAILY AS INSTRUCTED 450 each 3  . Cholecalciferol (VITAMIN D3) 25 MCG (1000 UT) CAPS Take 1,000 Units by mouth at bedtime.    . fenofibrate 160 MG tablet Take 1 tablet (160 mg total) by mouth at bedtime. 90 tablet 1  . fluocinonide cream (LIDEX) 8.75 % Apply 1 application topically daily as needed (for rashes).     . fluticasone (FLONASE) 50 MCG/ACT nasal spray Place 2 sprays into both nostrils daily. (Patient taking differently: Place 2 sprays into both nostrils daily as needed for allergies or rhinitis. ) 16 g 1  . furosemide (LASIX) 40 MG tablet TAKE 1 AND 1/2 TABLETS EVERY DAY 135 tablet 3  . glucose blood (TRUE METRIX BLOOD GLUCOSE TEST) test strip USE TO TEST BLOOD SUGAR THREE TIMES DAILY AS DIRECTED 300 each 3  . HYDROcodone-acetaminophen (NORCO)  10-325 MG tablet Take 0.5 tablets by mouth 2 (two) times daily.  0  . insulin NPH Human (NOVOLIN N) 100 UNIT/ML injection Inject 45 units in am and 40 in the evening (Patient taking differently: Inject 45-50 Units into the skin See admin instructions. Inject 45 units into the skin before breakfast and 50 units before supper/evening meal) 30 mL 3  . insulin regular (NOVOLIN R RELION) 100 units/mL injection Inject 0.25-0.3 mLs (25-30 Units total) into the skin 3 (three) times daily before meals. (Patient taking differently: Inject 35 Units into the skin 3 (three) times daily before meals. ) 70 mL 3  . isosorbide mononitrate (IMDUR) 30 MG 24 hr tablet Take 1 tablet (30 mg total) by mouth daily. 90 tablet 3  . levofloxacin (LEVAQUIN) 500 MG tablet Take 1 tablet (500 mg total) by mouth daily. 10 tablet 0  . MAGNESIUM PO Take 1 tablet by mouth at bedtime.    . metoprolol tartrate (LOPRESSOR) 50 MG tablet Take 0.5 tablets (25 mg total) by mouth 2 (two) times daily.    . nitroGLYCERIN (NITROSTAT) 0.4 MG SL tablet Place 1 tablet (0.4 mg total) under the tongue every 5 (five) minutes as needed for chest pain. 100 tablet 3  . oxybutynin (DITROPAN) 5 MG tablet Take 1 tablet (5 mg total) by mouth 2 (two) times daily.  60 tablet 1  . pantoprazole (PROTONIX) 20 MG tablet Take 1 tablet (20 mg total) by mouth daily. (Patient taking differently: Take 20 mg by mouth at bedtime. ) 90 tablet 3  . Probiotic Product (PROBIOTIC PO) Take 1 capsule by mouth at bedtime.     . tamsulosin (FLOMAX) 0.4 MG CAPS capsule Take 1 capsule (0.4 mg total) by mouth daily. 90 capsule 1  . tiZANidine (ZANAFLEX) 4 MG tablet Take 4 mg by mouth 2 (two) times daily.  6   No current facility-administered medications on file prior to visit.    Allergies  Allergen Reactions  . Brilinta [Ticagrelor] Shortness Of Breath  . Levemir [Insulin Detemir] Swelling and Other (See Comments)    Patient had redness and swelling and tenderness at injection  site.  . Codeine Other (See Comments)    Makes him "shakey", is OK with hydrocodone GI UPSET & TREMORS  . Lipitor [Atorvastatin] Other (See Comments)    Muscle aches; affects liver function- Patient is currently taking  . Novolog Mix [Insulin Aspart Prot & Aspart] Other (See Comments)    Causes skin to swell/itch at injection site   Family History  Problem Relation Age of Onset  . Cancer Mother        intestinal   . Heart disease Mother   . Cancer Father        prostate  . Migraines Daughter   . Leukemia Sister   . Heart disease Sister   . Prostate cancer Other   . Kidney cancer Other   . Cancer Other        Bladder cancer  . Coronary artery disease Other   . Hypertension Sister   . Hypertension Brother   . Heart attack Neg Hx   . Stroke Neg Hx     PE: BP 138/60   Pulse 68   Temp 97.6 F (36.4 C) (Oral)   Ht 5' 10.5" (1.791 m)   Wt 254 lb 4.8 oz (115.3 kg)   SpO2 96%   BMI 35.97 kg/m  Body mass index is 35.97 kg/m. Wt Readings from Last 3 Encounters:  12/28/18 254 lb 4.8 oz (115.3 kg)  12/08/18 250 lb 6.4 oz (113.6 kg)  11/28/18 256 lb 1 oz (116.1 kg)   Constitutional: overweight, in NAD Eyes: PERRLA, EOMI, no exophthalmos ENT: moist mucous membranes, no thyromegaly, no cervical lymphadenopathy Cardiovascular: RRR, No MRG Respiratory: CTA B Gastrointestinal: abdomen soft, NT, ND, BS+ Musculoskeletal: no deformities, strength intact in all 4 Skin: moist, warm, no rashes Neurological: no tremor with outstretched hands, DTR normal in all 4  ASSESSMENT: 1. DM2, insulin-dependent, uncontrolled, with complications - CAD, s/p stents - He had stenting of distal RCA/PDA in 07/2013. Prior stenting of LAD in 04/2013. 2 new stents: PCI to left circumflex and the mid RCA on 07/30/2016. - AAA - CKD stage 3  2. HL  3.  Obesity class II BMI Classification:  < 18.5 underweight   18.5-24.9 normal weight   25.0-29.9 overweight   30.0-34.9 class I obesity    35.0-39.9 class II obesity   ? 40.0 class III obesity   PLAN:  1. Patient with history of uncontrolled type 2 diabetes, on basal-bolus insulin regimen, with significant improvement in his sugars after eliminating meat from his diet almost completely. -At this visit, sugars are mostly at goal, but he had some spikes in the evening especially when he was dealing with his kidney stone/UTIs.  Also, upon questioning, while he is trying  to take the insulin doses before meals, he sometimes eats dinner out and in those situations, he injects insulin when he comes home.  In those cases, sugars can be 180-200 whenever he reaches home.  We discussed that ideally he would take all the insulin doses before a meal, however, if he has to inject after a meal, to only take 50% of the insulin to make sure he is not dropping his sugars too much afterwards.  He did have a sugar of 51 since last visit, before lunch, but he does not remember what happened at that time.  Otherwise, no significant lows since last visit. -We will not change his regimen for now. - I advised him to: Patient Instructions   Please continue: Insulin Before breakfast Before lunch Before dinner  Regular 35 30-35 35 (if sugars <100, take 15 units)  NPH 45  50   If you take dinnertime insulin after dinner, take 50% of the dose.  Please return in 4 months with your sugar log.   - continue checking sugars at different times of the day - check 3x a day, rotating checks - advised for yearly eye exams >> he is not UTD - Return to clinic in 4 mo with sugar log       2. HL - Reviewed latest lipid panel from 12/2018: LDL excellent, triglycerides slightly high, HDL low Lab Results  Component Value Date   CHOL 104 12/15/2018   HDL 26.00 (L) 12/15/2018   LDLCALC 38 12/15/2018   LDLDIRECT 69.0 01/24/2018   TRIG 197.0 (H) 12/15/2018   CHOLHDL 4 12/15/2018  - Continues Lipitor and fenofibrate along with flax oil without side effects.  3.   Obesity  -He did very well on a plant-based diet and I encouraged him to continue -He gained weight since last visit: 248 >> 254 lbs  [name], MD PhD [facility]

## 2018-12-29 ENCOUNTER — Encounter (HOSPITAL_BASED_OUTPATIENT_CLINIC_OR_DEPARTMENT_OTHER): Payer: Self-pay | Admitting: *Deleted

## 2018-12-29 ENCOUNTER — Other Ambulatory Visit: Payer: Self-pay

## 2018-12-29 ENCOUNTER — Emergency Department (HOSPITAL_BASED_OUTPATIENT_CLINIC_OR_DEPARTMENT_OTHER)
Admission: EM | Admit: 2018-12-29 | Discharge: 2018-12-29 | Discharge status: Against Medical Advice | Payer: Medicare HMO | Attending: Emergency Medicine | Admitting: Emergency Medicine

## 2018-12-29 ENCOUNTER — Ambulatory Visit: Payer: Self-pay

## 2018-12-29 ENCOUNTER — Emergency Department (HOSPITAL_BASED_OUTPATIENT_CLINIC_OR_DEPARTMENT_OTHER): Payer: Medicare HMO

## 2018-12-29 DIAGNOSIS — Z85828 Personal history of other malignant neoplasm of skin: Secondary | ICD-10-CM | POA: Diagnosis not present

## 2018-12-29 DIAGNOSIS — Z794 Long term (current) use of insulin: Secondary | ICD-10-CM | POA: Insufficient documentation

## 2018-12-29 DIAGNOSIS — F329 Major depressive disorder, single episode, unspecified: Secondary | ICD-10-CM | POA: Insufficient documentation

## 2018-12-29 DIAGNOSIS — Z87891 Personal history of nicotine dependence: Secondary | ICD-10-CM | POA: Diagnosis not present

## 2018-12-29 DIAGNOSIS — R55 Syncope and collapse: Secondary | ICD-10-CM | POA: Diagnosis not present

## 2018-12-29 DIAGNOSIS — Z79899 Other long term (current) drug therapy: Secondary | ICD-10-CM | POA: Insufficient documentation

## 2018-12-29 DIAGNOSIS — E1122 Type 2 diabetes mellitus with diabetic chronic kidney disease: Secondary | ICD-10-CM | POA: Insufficient documentation

## 2018-12-29 DIAGNOSIS — I130 Hypertensive heart and chronic kidney disease with heart failure and stage 1 through stage 4 chronic kidney disease, or unspecified chronic kidney disease: Secondary | ICD-10-CM | POA: Diagnosis not present

## 2018-12-29 DIAGNOSIS — Z7982 Long term (current) use of aspirin: Secondary | ICD-10-CM | POA: Diagnosis not present

## 2018-12-29 DIAGNOSIS — I517 Cardiomegaly: Secondary | ICD-10-CM | POA: Diagnosis not present

## 2018-12-29 DIAGNOSIS — I5032 Chronic diastolic (congestive) heart failure: Secondary | ICD-10-CM | POA: Diagnosis not present

## 2018-12-29 DIAGNOSIS — R42 Dizziness and giddiness: Secondary | ICD-10-CM | POA: Diagnosis not present

## 2018-12-29 DIAGNOSIS — N183 Chronic kidney disease, stage 3 (moderate): Secondary | ICD-10-CM | POA: Insufficient documentation

## 2018-12-29 DIAGNOSIS — Z955 Presence of coronary angioplasty implant and graft: Secondary | ICD-10-CM | POA: Insufficient documentation

## 2018-12-29 DIAGNOSIS — I252 Old myocardial infarction: Secondary | ICD-10-CM | POA: Diagnosis not present

## 2018-12-29 DIAGNOSIS — I2510 Atherosclerotic heart disease of native coronary artery without angina pectoris: Secondary | ICD-10-CM | POA: Diagnosis not present

## 2018-12-29 LAB — CBC WITH DIFFERENTIAL/PLATELET
Abs Immature Granulocytes: 0.02 10*3/uL (ref 0.00–0.07)
Basophils Absolute: 0 10*3/uL (ref 0.0–0.1)
Basophils Relative: 0 %
Eosinophils Absolute: 0.2 10*3/uL (ref 0.0–0.5)
Eosinophils Relative: 3 %
HCT: 37.9 % — ABNORMAL LOW (ref 39.0–52.0)
Hemoglobin: 12.2 g/dL — ABNORMAL LOW (ref 13.0–17.0)
Immature Granulocytes: 0 %
Lymphocytes Relative: 32 %
Lymphs Abs: 1.8 10*3/uL (ref 0.7–4.0)
MCH: 30.7 pg (ref 26.0–34.0)
MCHC: 32.2 g/dL (ref 30.0–36.0)
MCV: 95.5 fL (ref 80.0–100.0)
Monocytes Absolute: 0.4 10*3/uL (ref 0.1–1.0)
Monocytes Relative: 7 %
Neutro Abs: 3.3 10*3/uL (ref 1.7–7.7)
Neutrophils Relative %: 58 %
Platelets: 171 10*3/uL (ref 150–400)
RBC: 3.97 MIL/uL — ABNORMAL LOW (ref 4.22–5.81)
RDW: 14.4 % (ref 11.5–15.5)
WBC: 5.7 10*3/uL (ref 4.0–10.5)
nRBC: 0 % (ref 0.0–0.2)

## 2018-12-29 LAB — URINALYSIS, ROUTINE W REFLEX MICROSCOPIC
Bilirubin Urine: NEGATIVE
Glucose, UA: NEGATIVE mg/dL
Hgb urine dipstick: NEGATIVE
Ketones, ur: NEGATIVE mg/dL
LEUKOCYTE UA: NEGATIVE
Nitrite: NEGATIVE
Protein, ur: NEGATIVE mg/dL
Specific Gravity, Urine: 1.01 (ref 1.005–1.030)
pH: 5.5 (ref 5.0–8.0)

## 2018-12-29 LAB — BASIC METABOLIC PANEL
ANION GAP: 8 (ref 5–15)
BUN: 28 mg/dL — ABNORMAL HIGH (ref 8–23)
CALCIUM: 9.6 mg/dL (ref 8.9–10.3)
CO2: 25 mmol/L (ref 22–32)
Chloride: 106 mmol/L (ref 98–111)
Creatinine, Ser: 1.7 mg/dL — ABNORMAL HIGH (ref 0.61–1.24)
GFR calc Af Amer: 44 mL/min — ABNORMAL LOW (ref >60)
GFR calc non Af Amer: 38 mL/min — ABNORMAL LOW (ref >60)
Glucose, Bld: 199 mg/dL — ABNORMAL HIGH (ref 70–99)
Potassium: 3.9 mmol/L (ref 3.5–5.1)
Sodium: 139 mmol/L (ref 135–145)

## 2018-12-29 LAB — TROPONIN I: Troponin I: <0.03 ng/mL (ref <0.03)

## 2018-12-29 NOTE — ED Triage Notes (Signed)
Dizziness since this am. He has been taking his BP every 30 minutes at home and states it is low.

## 2018-12-29 NOTE — Discharge Instructions (Signed)
You are seen in the emergency department today for feeling lightheaded and nearly passing out.  Your work-up in the emergency department revealed a normal heart enzyme, a chest x-ray that shows that your heart is enlarged but without acute changes, and labs consistent with your prior blood work.  We discussed keeping you in the hospital for admission and observation which you declined.  Please help and return to the emergency department anytime should you change your mind.  Please return to the emergency department immediately should you experience new or worsening symptoms including but not limited to lightheadedness, dizziness, chest pain, trouble breathing, passing out, or any other concerns.  Follow-up with primary care closely within the next 24 hours for reevaluation.  Again return to the ER at any time.

## 2018-12-29 NOTE — Telephone Encounter (Signed)
Patient's wife called and says her husband came in the living room and says he's feeling drunk and sat down in the recliner. She says she checked his BP at 1115, it was 74/42; 1130 76/40; 1145 84/43 P 69; 1157 96/44. She says he's dizzy and felt like he was going to pass out. Right now she says he's sitting in the chair, he says if he stands up he will feel dizzy again. I asked about recent illness, she denies. I asked did he fall, she denies, but says he was unsteady on his feet. I asked about other symptoms, she says neck pain. According to protocol, go to ED. Patient's wife says she doesn't want to take him to the office or the ED to get exposed to all this "stuff." She asks if I consult with Dr. Charlett Blake, I advised I have guidelines to follow and my clinical decision based on his symptoms of dizziness, is to go to the ED. I advised I will call the office for advice. I called and spoke to Nira Conn, [facility] who agrees with the disposition, however she put [name], [place] on the phone and [name] asked to speak to the patient's wife, conference call to connected, call transferred.  Reason for Disposition . [1] Systolic BP < 80 AND [7] NOT dizzy, lightheaded or weak  Answer Assessment - Initial Assessment Questions 1. BLOOD PRESSURE: "What is the blood pressure?" "Did you take at least two measurements 5 minutes apart?"     74/42 at 1115; 1130 76/40; 1145 84/43, P 69;  2. ONSET: "When did you take your blood pressure?"     This morning 3. HOW: "How did you obtain the blood pressure?" (e.g., visiting nurse, automatic home BP monitor)     Automatic home BP monitor 4. HISTORY: "Do you have a history of low blood pressure?" "What is your blood pressure normally?"     No 5. MEDICATIONS: "Are you taking any medications for blood pressure?" If yes: "Have they been changed recently?"     Yes 6. PULSE RATE: "Do you know what your pulse rate is?"      69 7. OTHER SYMPTOMS: "Have you been sick recently?" "Have you had  a recent injury?"     No 8. PREGNANCY: "Is there any chance you are pregnant?" "When was your last menstrual period?"     N/A  Answer Assessment - Initial Assessment Questions 1. DESCRIPTION: "Describe your dizziness."     Drunk feeling 2. LIGHTHEADED: "Do you feel lightheaded?" (e.g., somewhat faint, woozy, weak upon standing)     Yes 3. VERTIGO: "Do you feel like either you or the room is spinning or tilting?" (i.e. vertigo)     No 4. SEVERITY: "How bad is it?"  "Do you feel like you are going to faint?" "Can you stand and walk?"   - MILD - walking normally   - MODERATE - interferes with normal activities (e.g., work, school)    - SEVERE - unable to stand, requires support to walk, feels like passing out now.      Severe 5. ONSET:  "When did the dizziness begin?"     This morning 6. AGGRAVATING FACTORS: "Does anything make it worse?" (e.g., standing, change in head position)     Standing 7. HEART RATE: "Can you tell me your heart rate?" "How many beats in 15 seconds?"  (Note: not all patients can do this)       69 8. CAUSE: "What do you think is causing the  dizziness?"     I don't know 9. RECURRENT SYMPTOM: "Have you had dizziness before?" If so, ask: "When was the last time?" "What happened that time?"     No 10. OTHER SYMPTOMS: "Do you have any other symptoms?" (e.g., fever, chest pain, vomiting, diarrhea, bleeding)       Low BP, neck pain 11. PREGNANCY: "Is there any chance you are pregnant?" "When was your last menstrual period?"       N/A  Protocols used: LOW BLOOD PRESSURE-A-AH, DIZZINESS - [facility]

## 2018-12-29 NOTE — ED Notes (Signed)
Denies dizziness PTA.

## 2018-12-29 NOTE — ED Provider Notes (Addendum)
[facility] EMERGENCY DEPARTMENT Provider Note   CSN: [id number] Arrival date & time: 12/29/18  1356    History   Chief Complaint Chief Complaint  Patient presents with  . Dizziness    HPI William Hendrix is a 78 y.o. male with a hx of AAA, CAD w/ prior PCI & stent placement, diastolic CHF, stage III CKD, GERD, T2DM, HTN, and hyperlipidemia who presents to the ER for near syncope episode earlier this AM. Patient states that he was at rest in the seated position reading the newspaper when he started to feel lightheaded/dizzy as if he may pass out. He notes sxs worsened when he stood up and moved to a different chair for his wife to take his blood pressure. Bps measured at home were 70s/40s, 90s/40s, and 105/50s. Called PCP who directed ER evaluation. Upon ER arrival he is asymptomatic and pressures have improved. No alleviating/aggravating factors other than standing up mentioned earlier. Denies chest pain, dyspnea, syncope, numbness, focal weakness, nausea, vomiting, or diarrhea. He notes normal PO intake this AM. CBG prior to breakfast 120. Only change in medication was increase in tamsulosin to BID from QD about 1 weeka go.       HPI  Past Medical History:  Diagnosis Date  . AAA (abdominal aortic aneurysm) ([place]) 12/15/2014   a. Mild aneurysmal dilatation of the infrarenal abdominal aorta 3.2 cm - f/u due by 2019.  . Arthritis    "all over" (07/30/2016)  . CAD (coronary artery disease)    a. stent to LAD 2000. b. possible spasm by cath 2001. c. IVUS/PTCA/DES to mLAD 05/2013. d. PTCA/DES of dRCA into ostial rPDA 07/2013. e. PTCA of OM2, DES to Cx, DES to [facility] 07/2016.  Marland Kitchen Chronic bronchitis ([place])   . Chronic diastolic CHF (congestive heart failure) ([place])   . Chronic lower back pain   . Chronic neck pain   . CKD (chronic kidney disease), stage III ([place])   . Diabetic peripheral neuropathy ([place])   . Ejection fraction    55%, 07/2010, mild inferior hypo  . GERD  (gastroesophageal reflux disease)   . Gout   . H/O diverticulitis of colon 01/31/2015  . H/O hiatal hernia   . History of blood transfusion ~ 10/1940   "had pneumonia"  . History of kidney stones   . HTN (hypertension)   . Hyperlipidemia   . Melanoma of forearm, left ([place]) 02/2011   with wide excision   . Myocardial infarction ([place])   . Nephrolithiasis   . Obesity   . OSA on CPAP     [name] since 2000  . Peripheral neuropathy    both feet  . Positive D-dimer    a. significant elevation ,hospital 07/2010, etiology unclear. b. D Dimer chronically > 20.  Marland Kitchen Renal insufficiency   . Skin cancer   . Spinal stenosis    a. s/p surgical repair 2013  . Spinal stenosis of lumbar region   . Type II diabetes mellitus ([place])   . Ventral hernia     Patient Active Problem List   Diagnosis Date Noted  . Acute renal failure superimposed on stage 3 chronic kidney disease ([place]) 11/12/2018  . Chronic diastolic CHF (congestive heart failure) ([place]) 11/12/2018  . AKI (acute kidney injury) ([place])   . Overactive bladder 10/31/2018  . Acute diastolic CHF (congestive heart failure) ([place])   . Chest pain 09/15/2018  . UTI (urinary tract infection) 08/01/2018  . Muscle cramps 06/13/2018  . Urinary incontinence  05/01/2018  . Anemia 11/07/2017  . Constipation 11/07/2017  . Lumbar stenosis with neurogenic claudication 10/22/2017  . Back pain 08/29/2017  . Right-sided low back pain with right-sided sciatica 11/22/2016  . Pressure injury of skin 08/03/2016  . Dyspnea 08/01/2016  . NSTEMI (non-ST elevated myocardial infarction) ([place]) 07/30/2016  . CAD in native artery   . Obesity   . Uncontrolled type 2 diabetes mellitus with circulatory disorder, with long-term current use of insulin ([place]) 11/28/2015  . Lumbar radiculopathy 10/09/2015  . Neuropathy, diabetic ([place]) 05/22/2015  . H/O diverticulitis of colon 01/31/2015  . AAA (abdominal aortic aneurysm) ([place]) 01/01/2015  . Sinusitis 10/10/2014  . Right  hip pain 07/05/2014  . Palpitations 02/27/2014  . Claudication of lower extremity ([place]) 10/25/2013  . BPH with obstruction/lower urinary tract symptoms 10/25/2013  . Hypertriglyceridemia 08/15/2012  . Melanoma ([place])   . Dizziness   . Renal insufficiency   . Coronary artery disease with exertional angina ([place])   . Hyperlipidemia   . Essential hypertension   . OSA (obstructive sleep apnea)   . Ejection fraction   . SOB (shortness of breath)   . PARESTHESIA 05/30/2009  . EDEMA 01/22/2009  . OTHER ABNORMAL BLOOD CHEMISTRY 04/25/2008  . Gout 10/21/2007  . Depression 10/21/2007  . VENTRAL HERNIA 10/21/2007  . HIATAL HERNIA 10/21/2007  . FATIGUE 10/21/2007  . SKIN CANCER, HX OF 10/21/2007  . NEPHROLITHIASIS, HX OF 10/21/2007    Past Surgical History:  Procedure Laterality Date  . ANTERIOR LAT LUMBAR FUSION  10/22/2017   Procedure: Lumbar two-three Lumbar three-four Lumbar four-five Anteriolateral lumbar interbody fusion with percutaneous pedicle screw fixation and infuse;  Surgeon: [name], MD;  Location: [facility];  Service: Neurosurgery;;  . APPLICATION OF ROBOTIC ASSISTANCE FOR SPINAL PROCEDURE  10/22/2017   Procedure: APPLICATION OF ROBOTIC ASSISTANCE FOR SPINAL PROCEDURE;  Surgeon: [name], MD;  Location: [place];  Service: Neurosurgery;;  . BACK SURGERY    . CARDIAC CATHETERIZATION N/A 07/30/2016   Procedure: Left Heart Cath and Coronary Angiography;  Surgeon: Nelva Bush, MD;  Location: [facility] CV LAB;  Service: Cardiovascular;  Laterality: N/A;  . CARDIAC CATHETERIZATION N/A 07/30/2016   Procedure: Coronary Stent Intervention;  Surgeon: Nelva Bush, MD;  Location: [facility] CV LAB;  Service: Cardiovascular;  Laterality: N/A;  Mid CFX and MID RCA  . CARDIAC CATHETERIZATION N/A 07/30/2016   Procedure: Coronary Balloon Angioplasty;  Surgeon: Nelva Bush, MD;  Location: [place] [facility] CV LAB;  Service: Cardiovascular;  Laterality: N/A;  OM 1  . CARPAL TUNNEL  RELEASE Left   . CATARACT EXTRACTION W/ INTRAOCULAR LENS  IMPLANT, BILATERAL Bilateral ~ 2014  . CERVICAL DISC SURGERY  1990s  . CORONARY ANGIOPLASTY WITH STENT PLACEMENT  05/2013  . CORONARY ANGIOPLASTY WITH STENT PLACEMENT  07/26/2013   DES to RCA extending to PDA    . CORONARY ANGIOPLASTY WITH STENT PLACEMENT  07/30/2016  . CORONARY ANGIOPLASTY WITH STENT PLACEMENT  2000   CAD  . DENTAL SURGERY  04/2016   "got infected; had to dig it out"  . EYE SURGERY    . KNEE ARTHROSCOPY Right 1990's   rt  . LEFT AND RIGHT HEART CATHETERIZATION WITH CORONARY ANGIOGRAM N/A 07/26/2013   Procedure: LEFT AND RIGHT HEART CATHETERIZATION WITH CORONARY ANGIOGRAM;  Surgeon: [name], MD;  Location: [facility] CATH LAB;  Service: Cardiovascular;  Laterality: N/A;  . LEFT HEART CATH AND CORONARY ANGIOGRAPHY N/A 02/26/2017   Procedure: Left Heart Cath and Coronary Angiography;  Surgeon: Sherren Mocha, MD;  Location: [facility] CV LAB;  Service: Cardiovascular;  Laterality: N/A;  . LEFT HEART CATHETERIZATION WITH CORONARY ANGIOGRAM N/A 05/19/2013   Procedure: LEFT HEART CATHETERIZATION WITH CORONARY ANGIOGRAM;  Surgeon: [name], MD;  Location[facility] CATH LAB;  Service: Cardiovascular;  Laterality: N/A;  . LUMBAR LAMINECTOMY/DECOMPRESSION MICRODISCECTOMY  08/30/2012   Procedure: LUMBAR LAMINECTOMY/DECOMPRESSION MICRODISCECTOMY 2 LEVELS;  Surgeon: [name], MD;  Location: [facility] NEURO ORS;  Service: Neurosurgery;  Laterality: Bilateral;  Bilateral Lumbar three-four Lumbar four-five Laminotomies  . LUMBAR PERCUTANEOUS PEDICLE SCREW 1 LEVEL Bilateral 10/22/2017   Procedure: LUMBAR PERCUTANEOUS PEDICLE SCREW 1 LEVEL;  Surgeon: [name], MD;  Location: [facility];  Service: Neurosurgery;  Laterality: Bilateral;  . LUMBAR SPINE SURGERY  04/07/2016   Dr. Ellene Route; "?ruptured disc"  . MELANOMA EXCISION Left 02/2011   forearm  . NASAL SINUS SURGERY  1970s   "cut windows in sinus pockets"  . PERCUTANEOUS CORONARY  STENT INTERVENTION (PCI-S)  05/19/2013   Procedure: PERCUTANEOUS CORONARY STENT INTERVENTION (PCI-S);  Surgeon: [name], MD;  Location: [facility] CATH LAB;  Service: Cardiovascular;;  . ULNAR TUNNEL RELEASE Left 04/07/2016   Dr. Ellene Route        Home Medications    Prior to Admission medications   Medication Sig Start Date End Date Taking? Authorizing Provider  acetaminophen (TYLENOL) 500 MG tablet Take 500 mg by mouth 2 (two) times daily.    Yes [provider]  albuterol (PROVENTIL HFA;VENTOLIN HFA) 108 (90 Base) MCG/ACT inhaler Inhale 2 puffs into the lungs every 6 (six) hours as needed for wheezing or shortness of breath. 10/30/16  Yes Saguier, Percell Miller, PA-C  allopurinol (ZYLOPRIM) 300 MG tablet Take 1 tablet (300 mg total) by mouth daily. 08/01/18  Yes [name], MD  aspirin EC 81 MG tablet Take 81 mg by mouth daily. 02/23/11  Yes [name], MD  atorvastatin (LIPITOR) 40 MG tablet Take 1 tablet (40 mg total) by mouth daily. 08/01/18  Yes [name], MD  B Complex Vitamins (B COMPLEX-B12 PO) Take 1 tablet by mouth once a week.  07/23/16  Yes [provider]  BD INSULIN SYRINGE ULTRAFINE 31G X 5/16" 1 ML MISC USE  TO INJECT  INSULIN  FIVE  TIMES  DAILY AS INSTRUCTED 03/17/17  Yes [name], MD  Cholecalciferol (VITAMIN D3) 25 MCG (1000 UT) CAPS Take 1,000 Units by mouth at bedtime.   Yes [provider]  fenofibrate 160 MG tablet Take 1 tablet (160 mg total) by mouth at bedtime. 08/01/18  Yes [name], MD  fluocinonide cream (LIDEX) 4.85 % Apply 1 application topically daily as needed (for rashes).  08/11/12  Yes [provider]  fluticasone (FLONASE) 50 MCG/ACT nasal spray Place 2 sprays into both nostrils daily. Patient taking differently: Place 2 sprays into both nostrils daily as needed for allergies or rhinitis.  08/11/17  Yes Saguier, Percell Miller, PA-C  furosemide (LASIX) 40 MG tablet TAKE 1 AND 1/2 TABLETS EVERY DAY 11/14/18  Yes  Vann, Jessica U, DO  glucose blood (TRUE METRIX BLOOD GLUCOSE TEST) test strip USE TO TEST BLOOD SUGAR THREE TIMES DAILY AS DIRECTED 08/01/18  Yes [name], MD  HYDROcodone-acetaminophen (NORCO) 10-325 MG tablet Take 0.5 tablets by mouth 2 (two) times daily. 08/10/18  Yes [provider]  insulin NPH Human (NOVOLIN N) 100 UNIT/ML injection Inject 45 units in am and 40 in the evening Patient taking differently: Inject 45-50 Units into the  skin See admin instructions. Inject 45 units into the skin before breakfast and 50 units before supper/evening meal 05/09/18  Yes [name], MD  insulin regular (NOVOLIN R RELION) 100 units/mL injection Inject 0.25-0.3 mLs (25-30 Units total) into the skin 3 (three) times daily before meals. Patient taking differently: Inject 35 Units into the skin 3 (three) times daily before meals.  09/17/17  Yes [name], MD  isosorbide mononitrate (IMDUR) 30 MG 24 hr tablet Take 1 tablet (30 mg total) by mouth daily. 09/26/18  Yes Lelon Perla, MD  levofloxacin (LEVAQUIN) 500 MG tablet Take 1 tablet (500 mg total) by mouth daily. 11/30/18  Yes Saguier, Percell Miller, PA-C  MAGNESIUM PO Take 1 tablet by mouth at bedtime.   Yes [provider]  metoprolol tartrate (LOPRESSOR) 50 MG tablet Take 0.5 tablets (25 mg total) by mouth 2 (two) times daily. 11/12/18  Yes Vann, Jessica U, DO  nitroGLYCERIN (NITROSTAT) 0.4 MG SL tablet Place 1 tablet (0.4 mg total) under the tongue every 5 (five) minutes as needed for chest pain. 02/16/18  Yes Lelon Perla, MD  oxybutynin (DITROPAN) 5 MG tablet Take 1 tablet (5 mg total) by mouth 2 (two) times daily. 10/28/18  Yes [name], MD  pantoprazole (PROTONIX) 20 MG tablet Take 1 tablet (20 mg total) by mouth daily. Patient taking differently: Take 20 mg by mouth at bedtime.  08/01/18  Yes [name], MD  Probiotic Product (PROBIOTIC PO) Take 1 capsule by mouth at bedtime.    Yes [provider]  tamsulosin (FLOMAX) 0.4 MG CAPS capsule Take 1 capsule (0.4 mg total) by mouth daily. 10/28/18  Yes [name], MD  tiZANidine (ZANAFLEX) 4 MG tablet Take 4 mg by mouth 2 (two) times daily. 08/12/18  Yes [provider]    Family History Family History  Problem Relation Age of Onset  . Cancer Mother        intestinal   . Heart disease Mother   . Cancer Father        prostate  . Migraines Daughter   . Leukemia Sister   . Heart disease Sister   . Prostate cancer Other   . Kidney cancer Other   . Cancer Other        Bladder cancer  . Coronary artery disease Other   . Hypertension Sister   . Hypertension Brother   . Heart attack Neg Hx   . Stroke Neg Hx     Social History Social History   Tobacco Use  . Smoking status: Former Smoker    Packs/day: 3.00    Years: 30.00    Pack years: 90.00    Types: Cigarettes    Last attempt to quit: 09/17/1996    Years since quitting: 22.2  . Smokeless tobacco: Never Used  Substance Use Topics  . Alcohol use: No  . Drug use: No     Allergies   Brilinta [ticagrelor]; Levemir [insulin detemir]; Codeine; Lipitor [atorvastatin]; and Novolog mix [insulin aspart prot & aspart]   Review of Systems Review of Systems  Constitutional: Negative for chills and fever.  HENT: Negative for dental problem.   Respiratory: Negative for cough and shortness of breath.   Cardiovascular: Negative for chest pain.  Gastrointestinal: Negative for abdominal pain, diarrhea, nausea and vomiting.  Genitourinary: Negative for dysuria.  Neurological: Positive for dizziness (resolved at present) and light-headedness (resolved at present). Negative for syncope, weakness and numbness.  All other systems reviewed  and are negative.    Physical Exam Updated Vital Signs BP 133/60   Pulse 82   Temp 98.1 F (36.7 C) (Oral)   Resp 18   Ht 5' 10.5" (1.791 m)   Wt 115.3 kg   SpO2 96%   BMI 35.96 kg/m   Physical Exam Vitals signs and  nursing note reviewed.  Constitutional:      General: He is not in acute distress.    Appearance: He is well-developed. He is not toxic-appearing.  HENT:     Head: Normocephalic and atraumatic.     Mouth/Throat:     Mouth: Mucous membranes are moist.     Comments: Uvula midline Eyes:     General:        Right eye: No discharge.        Left eye: No discharge.     Extraocular Movements: Extraocular movements intact.     Conjunctiva/sclera: Conjunctivae normal.     Pupils: Pupils are equal, round, and reactive to light.  Neck:     Musculoskeletal: Neck supple.  Cardiovascular:     Rate and Rhythm: Normal rate and regular rhythm.  Pulmonary:     Effort: Pulmonary effort is normal. No respiratory distress.     Breath sounds: Normal breath sounds. No wheezing, rhonchi or rales.  Abdominal:     General: There is no distension.     Palpations: Abdomen is soft.     Tenderness: There is no abdominal tenderness.  Skin:    General: Skin is warm and dry.     Findings: No rash.  Neurological:     Mental Status: He is alert.     Comments: Alert. Clear speech. No facial droop. CNIII-XII grossly intact. Bilateral upper and lower extremities' sensation grossly intact. 5/5 symmetric strength with grip strength and with plantar and dorsi flexion bilaterally.. Normal finger to nose bilaterally. Negative pronator drift. Negative Romberg sign. Gait is steady and intact.   Psychiatric:        Behavior: Behavior normal.    ED Treatments / Results  Labs (all labs ordered are listed, but only abnormal results are displayed) Labs Reviewed  CBC WITH DIFFERENTIAL/PLATELET - Abnormal; Notable for the following components:      Result Value   RBC 3.97 (*)    Hemoglobin 12.2 (*)    HCT 37.9 (*)    All other components within normal limits  BASIC METABOLIC PANEL - Abnormal; Notable for the following components:   Glucose, Bld 199 (*)    BUN 28 (*)    Creatinine, Ser 1.70 (*)    GFR calc non Af Amer  38 (*)    GFR calc Af Amer 44 (*)    All other components within normal limits  URINALYSIS, ROUTINE W REFLEX MICROSCOPIC  TROPONIN I    EKG EKG Interpretation  Date/Time:  Thursday December 29 2018 14:39:53 EDT Ventricular Rate:  73 PR Interval:    QRS Duration: 98 QT Interval:  409 QTC Calculation: 451 R Axis:   -59 Text Interpretation:  Sinus rhythm Left anterior fascicular block Abnormal R-wave progression, early transition Baseline wander in lead(s) V3 Confirmed by Gerlene Fee [phone]) on 12/29/2018 3:29:13 PM   Radiology Dg Chest 2 View  Result Date: 12/29/2018 CLINICAL DATA:  Onset dizziness this morning. EXAM: CHEST - 2 VIEW COMPARISON:  PA and lateral chest 10/13/2018 and 10/30/2016. FINDINGS: Cardiomegaly without edema. Coronary artery stent noted. No consolidative process, pneumothorax or effusion. No acute or focal bony abnormality.  IMPRESSION: Cardiomegaly without acute disease. Electronically Signed   By: Inge Rise M.D.   On: 12/29/2018 14:57    Procedures Procedures (including critical care time)  Medications Ordered in ED Medications - No data to display   Echo 09/16/2018 Study Conclusions  - Left ventricle: The cavity size was normal. Wall thickness was   increased in a pattern of moderate LVH. Systolic function was   normal. The estimated ejection fraction was in the range of 55%   to 60%. Doppler parameters are consistent with both elevated   ventricular end-diastolic filling pressure and elevated left   atrial filling pressure. - Aortic valve: Cannot tell if valve is tri leaflet or not.   Sclerosis without stenosis. There was trivial regurgitation. - Left atrium: The atrium was mildly dilated. - Atrial septum: No defect or patent foramen ovale was identified. - Pericardium, extracardiac: A trivial pericardial effusion was   identified.  Initial Impression / Assessment and Plan / ED Course  I have reviewed the triage vital signs and the nursing  notes.  Pertinent labs & imaging results that were available during my care of the patient were reviewed by me and considered in my medical decision making (see chart for details).   Patient w/ hx of CAD, CHF, & several other medical comorbidities presents to the ER s/p near syncopal event w/ low BP readings at home. Nontoxic appearing, in no apparent distress, vitals WNL with the exception of mildly low diastolic BP upon arrival (120s/50s). Patient asymptomatic upon my assessment. Normal neurologic exam- does not seem consistent with stroke. Plan for labs, EKG, trop, will include CXR/UA to evaluate for possible infection as well. concern for possibly arrhythmia, feel ACS is less likely. Anticipate admission for near syncope in patient w/ structural heart disease.    Work-up reviewed:  CBC: No leukocytosis. Baseline anemia on chart review.  BMP: Hyperglycemia without findings consistent with DKA. Renal function @ baseline per chart review. No significant electrolyte disturbance.  UA: Negative.  CXR: Cardiomegaly without acute change.  EKG: No STEMI.  Troponin: Negative.    This is a shared visit with supervising physician Dr. Sedonia Small who has independently evaluated patient & is in agreement with evaluation/management/plan for admission.   16:54: RE-EVAL: Upon re-discussion of admission for observation patient and his wife informed me that he would prefer to go home, he does not wish to stay in the ER or be admitted at this time. The patient and I discussed the nature and purpose, risks and benefits, as well as, the alternatives of treatment (including ER observation w/ delta troponin). Time was given to allow the opportunity to ask questions and consider options. After the discussion, the patient decided to refuse the offerred treatment. The patient was informed that refusal could lead to, but was not limited to, death, permanent disability, or severe pain. If present, I asked the relatives and/or  significant others to dissuade the patient without success. Prior to refusing, I determined that the patient had the capacity to make their decision and understood the consequences of that decision. After refusal, I made every reasonable effort to treat them to the best of my ability.  The patient was notified that they may return to the emergency department at any time for further treatment.     Final Clinical Impressions(s) / ED Diagnoses   Final diagnoses:  Near syncope    ED Discharge Orders    None       Amaryllis Dyke, PA-C 12/29/18 1557  Amaryllis Dyke, PA-C 12/29/18 1559    Maudie Flakes, MD 12/30/18 [phone]

## 2018-12-29 NOTE — ED Notes (Signed)
ED Provider at bedside. 

## 2018-12-30 DIAGNOSIS — Z87442 Personal history of urinary calculi: Secondary | ICD-10-CM | POA: Diagnosis not present

## 2018-12-30 DIAGNOSIS — G4733 Obstructive sleep apnea (adult) (pediatric): Secondary | ICD-10-CM | POA: Diagnosis not present

## 2018-12-30 LAB — URINE CULTURE: Culture: NO GROWTH

## 2019-01-06 ENCOUNTER — Telehealth: Payer: Self-pay | Admitting: Family Medicine

## 2019-01-06 NOTE — Telephone Encounter (Signed)
Copied from [place] [phone]. Topic: Quick Communication - See Telephone Encounter >> Jan 06, 2019  3:06 PM Rayann Heman wrote: CRM for notification. See Telephone encounter for: 01/06/19. Patients wife called and left a voicemail that states that she would like a call back from the nurse. Pt wife stated that patients urologist had suggested an increase in medication. Pt wife did not know what the medication was called. Please advise

## 2019-01-11 ENCOUNTER — Ambulatory Visit: Payer: Self-pay | Admitting: *Deleted

## 2019-01-11 ENCOUNTER — Telehealth: Payer: Self-pay | Admitting: Family Medicine

## 2019-01-11 NOTE — Telephone Encounter (Signed)
Please advise 

## 2019-01-11 NOTE — Telephone Encounter (Signed)
Pt's wife calling,on DPR, pt present during call. States seen in ED 12/29/2018 for 'Near syncopal episode.'  States hypotensive at that time. Reports "They wanted to admit him but we were afraid to stay at this time." Reports BP has been everywhere, from 156/65 to 174/76, then drops to 80s/40s. States this morning few minutes prior to call 84/42 and 76/44  HRR at 67 and 68.  States onset of lower range 12/19/2018 at 99/46-62/57, but has been fluctuating.  Pt has home monitor. States this am patient had episode of weakness, mild dizziness which led them to check his BP.  During call: Sitting  92/51  After 5 minutes checked standing  116/47. HR 72.  Denies any further dizziness, "Just feel drained." States is staying hydrated, denies CP, chest tightness, no sob. States flomax increased to 2x day on 3/12 (hypotension onset 12/19/2018) Also reports metoprolol  dose has been decreased, "A couple of times."  Pt does not have computer access, no smart phone or email. Care advise given per protocol, directed to ED if symptoms worsen. TN questioned if pt had alerted Dr. Stanford Breed of symptoms, states they have not been able to reach him. Please advise regarding appt. CB [phone]  Reason for Disposition . [2] Systolic BP 08-022 AND [3] taking blood pressure medications AND [3] dizzy, lightheaded or weak  Answer Assessment - Initial Assessment Questions 1. BLOOD PRESSURE: "What is the blood pressure?" "Did you take at least two measurements 5 minutes apart?"     84/42  Hr 67   76/44  HR 68 2. ONSET: "When did you take your blood pressure?"     Few minutes prior to call 3. HOW: "How did you obtain the blood pressure?" (e.g., visiting nurse, automatic home BP monitor)     Home monitor 4. HISTORY: "Do you have a history of low blood pressure?" "What is your blood pressure normally?"     Fluctuates 5. MEDICATIONS: "Are you taking any medications for blood pressure?" If yes: "Have they been changed recently?"     Yes,  states decreased dosage 6. PULSE RATE: "Do you know what your pulse rate is?"      67-72 7. OTHER SYMPTOMS: "Have you been sick recently?" "Have you had a recent injury?"     UTI  Protocols used: LOW BLOOD PRESSURE-A-AH

## 2019-01-11 NOTE — Telephone Encounter (Signed)
Patient's wife is checking on the request she made on 01/06/2019 to talk to a nurse.  She stated no one has called her back.

## 2019-01-11 NOTE — Telephone Encounter (Addendum)
Copied from [place] [id number]. Topic: Quick Communication - See Telephone Encounter >> Jan 11, 2019 11:31 AM Ivar Drape wrote: CRM for notification. See Telephone encounter for: 01/11/19. Patient would like a refill on the following medications and have them sent to his preferred pharmacy [facility]:  1)  allopurinol (ZYLOPRIM) 300 MG tablet 2)  atorvastatin (LIPITOR) 40 MG tablet 3)  fenofibrate 160 MG tablet  4)  tamsulosin (FLOMAX) 0.4 MG CAPS capsule Dr. Rhae Lerner instructions for #4 were to take them once a day, but the patients urologist (Dr. Lovena Neighbours) told him to take the medication twice a day.  So the prescription will need to be adjusted.

## 2019-01-11 NOTE — Telephone Encounter (Signed)
Have him not take his dose of Metoprolol tonight and take his am dose unless his BP is less than 90/55. Then get him on a phone call visit tomorrow. Hydrate well and seek care in ED if gets worse.

## 2019-01-12 MED ORDER — ALLOPURINOL 300 MG PO TABS: 300.0000 mg | ORAL_TABLET | Freq: Every day | ORAL | 1 refills | Status: DC

## 2019-01-12 MED ORDER — ATORVASTATIN CALCIUM 40 MG PO TABS: 40.0000 mg | ORAL_TABLET | Freq: Every day | ORAL | 1 refills | Status: DC

## 2019-01-12 MED ORDER — TAMSULOSIN HCL 0.4 MG PO CAPS: 0.4000 mg | ORAL_CAPSULE | Freq: Two times a day (BID) | ORAL | 1 refills | Status: DC

## 2019-01-12 MED ORDER — FENOFIBRATE 160 MG PO TABS: 160.0000 mg | ORAL_TABLET | Freq: Every day | ORAL | 1 refills | Status: DC

## 2019-01-12 NOTE — Telephone Encounter (Signed)
Medications sent in.

## 2019-01-17 ENCOUNTER — Telehealth: Payer: Self-pay | Admitting: Cardiology

## 2019-01-17 NOTE — Telephone Encounter (Signed)
Noted, thanks!

## 2019-01-17 NOTE — Telephone Encounter (Signed)
Spoke with spouse about getting virtual visit done

## 2019-01-17 NOTE — Telephone Encounter (Signed)
Pt c/o BP issue: STAT if pt c/o blurred vision, one-sided weakness or slurred speech  1. What are your last 5 BP readings1? This morning at 11:00 81/51 and today at 12:00 it was 89/53- most mornings the bottom number is in the 40"s  2. Are you having any other symptoms (ex. Dizziness, headache, blurred vision, passed out)? Very weak and staggering  3. What is your BP issue? Low blood pressure- thinks pt needs to be seen. Went to [facility] on 12-29-18 for this

## 2019-01-17 NOTE — Telephone Encounter (Signed)
Patient is calling in regarding to husbands BP, they called there PCP office on 04/01, and spoke with them - messages in epic; a message was responded by PCP:   William Lukes, MD  to [name], [place] . Jiles Prows, [facility]     01/11/19 1:37 PM  Note    Have him not take his dose of Metoprolol tonight and take his am dose unless his BP is less than 90/55. Then get him on a phone call visit tomorrow. Hydrate well and seek care in ED if gets worse.      Patient wife states no one ever called her and told her of these instructions.  They have continued to take medication has prescribed (metorpolol 0.5 tablet (25 mg in the morning, and in the evening). BP this morning was 84/54, and at lunchtime 89/53. Wife is very concerned because she says if they stop the Metoprolol his BP shoots up the next day to 164/84, and 163/83, they stated they have tried this before.   Please advise on BP issue, and any recommendations.

## 2019-01-17 NOTE — Telephone Encounter (Signed)
Triage routed call for hypotension.  I called and spoke with patient's wife.  Current medications include: imdur 30 mg Lopressor 25 mg BID Furosemide 60 mg daily flomax 0.4 mg BID  Pt has been hypotensive, current reading is  111/57 as of 1:45 pm today.   Pt and wife state he started getting hypotensive when his flomax was increased to BID. However, he is voiding better without recurrence of UTI since he has increased his flomax.    It sounds like the increased dose of flomax is beneficial for him. I will decrease his imdur to 15 mg daily. I will also ask him to touch base with Dr. Gilford Rile to discuss alternative therapies with less risk of hypotension.   I have instructed them to cut the imdur in half starting tomorrow - to 15 mg. I also asked them to hydrate in the morning, since that's when his pressure is low.  I also asked them to contact urology to see if there is an alternative to BID flomax with less hypotension.  They expressed understanding of the plan. They will call tomorrow with an update. > 20 min phone call   Ledora Bottcher, PA-C 01/17/2019, 2:20 PM

## 2019-01-17 NOTE — Telephone Encounter (Signed)
Spoke with wife about message last week

## 2019-01-23 ENCOUNTER — Other Ambulatory Visit: Payer: Self-pay

## 2019-01-23 ENCOUNTER — Ambulatory Visit (INDEPENDENT_AMBULATORY_CARE_PROVIDER_SITE_OTHER): Payer: Medicare HMO | Admitting: Family Medicine

## 2019-01-23 DIAGNOSIS — R32 Unspecified urinary incontinence: Secondary | ICD-10-CM

## 2019-01-23 DIAGNOSIS — G4733 Obstructive sleep apnea (adult) (pediatric): Secondary | ICD-10-CM | POA: Diagnosis not present

## 2019-01-23 DIAGNOSIS — I10 Essential (primary) hypertension: Secondary | ICD-10-CM | POA: Diagnosis not present

## 2019-01-23 NOTE — Assessment & Plan Note (Signed)
Still noting BP as low as 103/58, high of 156/82. Feels best at high numbers. Has not had any presyncopal or syncopal episodes with systolic level above 347. He will continue current meds for now and may consider Splitting up his use of Furosemide to 40 mg in am and 20-40 mg q noon as needed and he is encouraged to hydrate better and avoid artificial sweeteners.

## 2019-01-23 NOTE — Assessment & Plan Note (Signed)
Got his new CPAP machine 2 weeks ago and is using it regularly

## 2019-01-23 NOTE — Assessment & Plan Note (Signed)
Has had his urologist, Dr Lovena Neighbours change his meds due to labile BP. stopped his oxybutynin and Tamulosin last week and add Alfusozin which he feels he is tolerating

## 2019-01-23 NOTE — Progress Notes (Signed)
Virtual Visit via Video Note  I connected with William Hendrix on 01/23/19 at  3:00 PM EDT by a video enabled telemedicine application and verified that I am speaking with the correct person using two identifiers.   I discussed the limitations of evaluation and management by telemedicine and the availability of in person appointments. The patient expressed understanding and agreed to proceed. Patient was helped to get on the Video visit by [name] CMA    Subjective:    Patient ID: William Hendrix, male    DOB: [birth year], 78 y.o.   MRN: [id number]  No chief complaint on file.   HPI Patient is in today for follow up on labile blood pressure and an ER visit for near syncope. He has not had any recurrence of presyncope but he notes when his bp is at the lower end 103/58 he feels weak and woozey when it is higher at 152/58 he feels better and both of these numbers can be in the same day. He is drinking more water but is putting in a sucrolose sweetener. He is using his CPAP regularly now and that is helping some. Still struggles with pedal edema some. Denies CP/palp/SOB/HA/congestion/fevers/GI or GU c/o. Taking meds as prescribed  Past Medical History:  Diagnosis Date  . AAA (abdominal aortic aneurysm) ([place]) 12/15/2014   a. Mild aneurysmal dilatation of the infrarenal abdominal aorta 3.2 cm - f/u due by 2019.  . Arthritis    "all over" (07/30/2016)  . CAD (coronary artery disease)    a. stent to LAD 2000. b. possible spasm by cath 2001. c. IVUS/PTCA/DES to mLAD 05/2013. d. PTCA/DES of dRCA into ostial rPDA 07/2013. e. PTCA of OM2, DES to Cx, DES to [facility] 07/2016.  Marland Kitchen Chronic bronchitis ([place])   . Chronic diastolic CHF (congestive heart failure) ([place])   . Chronic lower back pain   . Chronic neck pain   . CKD (chronic kidney disease), stage III ([place])   . Diabetic peripheral neuropathy ([place])   . Ejection fraction    55%, 07/2010, mild inferior hypo  . GERD (gastroesophageal reflux  disease)   . Gout   . H/O diverticulitis of colon 01/31/2015  . H/O hiatal hernia   . History of blood transfusion ~ 10/1940   "had pneumonia"  . History of kidney stones   . HTN (hypertension)   . Hyperlipidemia   . Melanoma of forearm, left ([place]) 02/2011   with wide excision   . Myocardial infarction ([place])   . Nephrolithiasis   . Obesity   . OSA on CPAP     [name] since 2000  . Peripheral neuropathy    both feet  . Positive D-dimer    a. significant elevation ,hospital 07/2010, etiology unclear. b. D Dimer chronically > 20.  Marland Kitchen Renal insufficiency   . Skin cancer   . Spinal stenosis    a. s/p surgical repair 2013  . Spinal stenosis of lumbar region   . Type II diabetes mellitus ([place])   . Ventral hernia     Past Surgical History:  Procedure Laterality Date  . ANTERIOR LAT LUMBAR FUSION  10/22/2017   Procedure: Lumbar two-three Lumbar three-four Lumbar four-five Anteriolateral lumbar interbody fusion with percutaneous pedicle screw fixation and infuse;  Surgeon: [name], MD;  Location: [facility];  Service: Neurosurgery;;  . APPLICATION OF ROBOTIC ASSISTANCE FOR SPINAL PROCEDURE  10/22/2017   Procedure: APPLICATION OF ROBOTIC ASSISTANCE FOR SPINAL PROCEDURE;  Surgeon: [name], MD;  Location: [facility] OR;  Service: Neurosurgery;;  . BACK SURGERY    . CARDIAC CATHETERIZATION N/A 07/30/2016   Procedure: Left Heart Cath and Coronary Angiography;  Surgeon: Nelva Bush, MD;  Location: [place] CV LAB;  Service: Cardiovascular;  Laterality: N/A;  . CARDIAC CATHETERIZATION N/A 07/30/2016   Procedure: Coronary Stent Intervention;  Surgeon: Nelva Bush, MD;  Location: [place] CV LAB;  Service: Cardiovascular;  Laterality: N/A;  Mid CFX and MID RCA  . CARDIAC CATHETERIZATION N/A 07/30/2016   Procedure: Coronary Balloon Angioplasty;  Surgeon: Nelva Bush, MD;  Location: [facility] CV LAB;  Service: Cardiovascular;  Laterality: N/A;  OM 1  . CARPAL TUNNEL RELEASE Left    . CATARACT EXTRACTION W/ INTRAOCULAR LENS  IMPLANT, BILATERAL Bilateral ~ 2014  . CERVICAL DISC SURGERY  1990s  . CORONARY ANGIOPLASTY WITH STENT PLACEMENT  05/2013  . CORONARY ANGIOPLASTY WITH STENT PLACEMENT  07/26/2013   DES to RCA extending to PDA    . CORONARY ANGIOPLASTY WITH STENT PLACEMENT  07/30/2016  . CORONARY ANGIOPLASTY WITH STENT PLACEMENT  2000   CAD  . DENTAL SURGERY  04/2016   "got infected; had to dig it out"  . EYE SURGERY    . KNEE ARTHROSCOPY Right 1990's   rt  . LEFT AND RIGHT HEART CATHETERIZATION WITH CORONARY ANGIOGRAM N/A 07/26/2013   Procedure: LEFT AND RIGHT HEART CATHETERIZATION WITH CORONARY ANGIOGRAM;  Surgeon: [name], MD;  Location: [place] CATH LAB;  Service: Cardiovascular;  Laterality: N/A;  . LEFT HEART CATH AND CORONARY ANGIOGRAPHY N/A 02/26/2017   Procedure: Left Heart Cath and Coronary Angiography;  Surgeon: Sherren Mocha, MD;  Location: [place] CV LAB;  Service: Cardiovascular;  Laterality: N/A;  . LEFT HEART CATHETERIZATION WITH CORONARY ANGIOGRAM N/A 05/19/2013   Procedure: LEFT HEART CATHETERIZATION WITH CORONARY ANGIOGRAM;  Surgeon: [name], MD;  Location: [facility] CATH LAB;  Service: Cardiovascular;  Laterality: N/A;  . LUMBAR LAMINECTOMY/DECOMPRESSION MICRODISCECTOMY  08/30/2012   Procedure: LUMBAR LAMINECTOMY/DECOMPRESSION MICRODISCECTOMY 2 LEVELS;  Surgeon: [name], MD;  Location: [place] NEURO ORS;  Service: Neurosurgery;  Laterality: Bilateral;  Bilateral Lumbar three-four Lumbar four-five Laminotomies  . LUMBAR PERCUTANEOUS PEDICLE SCREW 1 LEVEL Bilateral 10/22/2017   Procedure: LUMBAR PERCUTANEOUS PEDICLE SCREW 1 LEVEL;  Surgeon: [name], MD;  Location: [facility];  Service: Neurosurgery;  Laterality: Bilateral;  . LUMBAR SPINE SURGERY  04/07/2016   Dr. Ellene Route; "?ruptured disc"  . MELANOMA EXCISION Left 02/2011   forearm  . NASAL SINUS SURGERY  1970s   "cut windows in sinus pockets"  . PERCUTANEOUS CORONARY STENT INTERVENTION  (PCI-S)  05/19/2013   Procedure: PERCUTANEOUS CORONARY STENT INTERVENTION (PCI-S);  Surgeon: [name], MD;  Location: [facility] CATH LAB;  Service: Cardiovascular;;  . ULNAR TUNNEL RELEASE Left 04/07/2016   Dr. Ellene Route    Family History  Problem Relation Age of Onset  . Cancer Mother        intestinal   . Heart disease Mother   . Cancer Father        prostate  . Migraines Daughter   . Leukemia Sister   . Heart disease Sister   . Prostate cancer Other   . Kidney cancer Other   . Cancer Other        Bladder cancer  . Coronary artery disease Other   . Hypertension Sister   . Hypertension Brother   . Heart attack Neg Hx   . Stroke Neg Hx     Social History  Socioeconomic History  . Marital status: Married    Spouse name: Not on file  . Number of children: Not on file  . Years of education: Not on file  . Highest education level: Not on file  Occupational History  . Occupation: [employer]  . Financial resource strain: Not on file  . Food insecurity:    Worry: Not on file    Inability: Not on file  . Transportation needs:    Medical: Not on file    Non-medical: Not on file  Tobacco Use  . Smoking status: Former Smoker    Packs/day: 3.00    Years: 30.00    Pack years: 90.00    Types: Cigarettes    Last attempt to quit: 09/17/1996    Years since quitting: 22.3  . Smokeless tobacco: Never Used  Substance and Sexual Activity  . Alcohol use: No  . Drug use: No  . Sexual activity: Not Currently    Birth control/protection: None  Lifestyle  . Physical activity:    Days per week: Not on file    Minutes per session: Not on file  . Stress: Not on file  Relationships  . Social connections:    Talks on phone: Not on file    Gets together: Not on file    Attends religious service: Not on file    Active member of club or organization: Not on file    Attends meetings of clubs or organizations: Not on file    Relationship status: Not on file  . Intimate  partner violence:    Fear of current or ex partner: Not on file    Emotionally abused: Not on file    Physically abused: Not on file    Forced sexual activity: Not on file  Other Topics Concern  . Not on file  Social History Narrative   Married and lives locally with his wife.  Sharyon Cable.    Outpatient Medications Prior to Visit  Medication Sig Dispense Refill  . acetaminophen (TYLENOL) 500 MG tablet Take 500 mg by mouth 2 (two) times daily.     Marland Kitchen albuterol (PROVENTIL HFA;VENTOLIN HFA) 108 (90 Base) MCG/ACT inhaler Inhale 2 puffs into the lungs every 6 (six) hours as needed for wheezing or shortness of breath. 1 Inhaler 0  . allopurinol (ZYLOPRIM) 300 MG tablet Take 1 tablet (300 mg total) by mouth daily. 90 tablet 1  . aspirin EC 81 MG tablet Take 81 mg by mouth daily.    Marland Kitchen atorvastatin (LIPITOR) 40 MG tablet Take 1 tablet (40 mg total) by mouth daily. 90 tablet 1  . B Complex Vitamins (B COMPLEX-B12 PO) Take 1 tablet by mouth once a week.     . BD INSULIN SYRINGE ULTRAFINE 31G X 5/16" 1 ML MISC USE  TO INJECT  INSULIN  FIVE  TIMES  DAILY AS INSTRUCTED 450 each 3  . Cholecalciferol (VITAMIN D3) 25 MCG (1000 UT) CAPS Take 1,000 Units by mouth at bedtime.    . fenofibrate 160 MG tablet Take 1 tablet (160 mg total) by mouth at bedtime. 90 tablet 1  . fluocinonide cream (LIDEX) 1.76 % Apply 1 application topically daily as needed (for rashes).     . fluticasone (FLONASE) 50 MCG/ACT nasal spray Place 2 sprays into both nostrils daily. (Patient taking differently: Place 2 sprays into both nostrils daily as needed for allergies or rhinitis. ) 16 g 1  . furosemide (LASIX) 40 MG tablet TAKE 1 AND 1/2  TABLETS EVERY DAY 135 tablet 3  . glucose blood (TRUE METRIX BLOOD GLUCOSE TEST) test strip USE TO TEST BLOOD SUGAR THREE TIMES DAILY AS DIRECTED 300 each 3  . HYDROcodone-acetaminophen (NORCO) 10-325 MG tablet Take 0.5 tablets by mouth 2 (two) times daily.  0  . insulin NPH Human (NOVOLIN N) 100  UNIT/ML injection Inject 45 units in am and 40 in the evening (Patient taking differently: Inject 45-50 Units into the skin See admin instructions. Inject 45 units into the skin before breakfast and 50 units before supper/evening meal) 30 mL 3  . insulin regular (NOVOLIN R RELION) 100 units/mL injection Inject 0.25-0.3 mLs (25-30 Units total) into the skin 3 (three) times daily before meals. (Patient taking differently: Inject 35 Units into the skin 3 (three) times daily before meals. ) 70 mL 3  . isosorbide mononitrate (IMDUR) 30 MG 24 hr tablet Take 1 tablet (30 mg total) by mouth daily. 90 tablet 3  . MAGNESIUM PO Take 1 tablet by mouth at bedtime.    . metoprolol tartrate (LOPRESSOR) 50 MG tablet Take 0.5 tablets (25 mg total) by mouth 2 (two) times daily.    . nitroGLYCERIN (NITROSTAT) 0.4 MG SL tablet Place 1 tablet (0.4 mg total) under the tongue every 5 (five) minutes as needed for chest pain. 100 tablet 3  . pantoprazole (PROTONIX) 20 MG tablet Take 1 tablet (20 mg total) by mouth daily. (Patient taking differently: Take 20 mg by mouth at bedtime. ) 90 tablet 3  . Probiotic Product (PROBIOTIC PO) Take 1 capsule by mouth at bedtime.     . tamsulosin (FLOMAX) 0.4 MG CAPS capsule Take 1 capsule (0.4 mg total) by mouth 2 (two) times daily. 180 capsule 1  . tiZANidine (ZANAFLEX) 4 MG tablet Take 4 mg by mouth 2 (two) times daily.  6  . levofloxacin (LEVAQUIN) 500 MG tablet Take 1 tablet (500 mg total) by mouth daily. 10 tablet 0  . oxybutynin (DITROPAN) 5 MG tablet Take 1 tablet (5 mg total) by mouth 2 (two) times daily. 60 tablet 1   No facility-administered medications prior to visit.     Allergies  Allergen Reactions  . Brilinta [Ticagrelor] Shortness Of Breath  . Levemir [Insulin Detemir] Swelling and Other (See Comments)    Patient had redness and swelling and tenderness at injection site.  . Codeine Other (See Comments)    Makes him "shakey", is OK with hydrocodone GI UPSET & TREMORS   . Lipitor [Atorvastatin] Other (See Comments)    Muscle aches; affects liver function- Patient is currently taking  . Novolog Mix [Insulin Aspart Prot & Aspart] Other (See Comments)    Causes skin to swell/itch at injection site    Review of Systems  Constitutional: Positive for malaise/fatigue. Negative for fever.  HENT: Negative for congestion.   Eyes: Negative for blurred vision.  Respiratory: Negative for shortness of breath.   Cardiovascular: Negative for chest pain, palpitations and leg swelling.  Gastrointestinal: Negative for abdominal pain, blood in stool and nausea.  Genitourinary: Negative for dysuria and frequency.  Musculoskeletal: Negative for falls.  Skin: Negative for rash.  Neurological: Positive for dizziness and weakness. Negative for loss of consciousness and headaches.  Endo/Heme/Allergies: Negative for environmental allergies.  Psychiatric/Behavioral: Negative for depression. The patient is not nervous/anxious.        Objective:    Physical Exam HENT:     Head: Normocephalic and atraumatic.  Neurological:     Mental Status: He is alert and  oriented to person, place, and time.  Psychiatric:        Mood and Affect: Mood normal.        Behavior: Behavior normal.     There were no vitals taken for this visit. Wt Readings from Last 3 Encounters:  12/29/18 254 lb 3.1 oz (115.3 kg)  12/28/18 254 lb 4.8 oz (115.3 kg)  12/08/18 250 lb 6.4 oz (113.6 kg)    Diabetic Foot Exam - Simple   No data filed     Lab Results  Component Value Date   WBC 5.7 12/29/2018   HGB 12.2 (L) 12/29/2018   HCT 37.9 (L) 12/29/2018   PLT 171 12/29/2018   GLUCOSE 199 (H) 12/29/2018   CHOL 104 12/15/2018   TRIG 197.0 (H) 12/15/2018   HDL 26.00 (L) 12/15/2018   LDLDIRECT 69.0 01/24/2018   LDLCALC 38 12/15/2018   ALT 15 12/15/2018   AST 19 12/15/2018   NA 139 12/29/2018   K 3.9 12/29/2018   CL 106 12/29/2018   CREATININE 1.70 (H) 12/29/2018   BUN 28 (H) 12/29/2018    CO2 25 12/29/2018   TSH 2.41 12/15/2018   PSA 1.01 11/02/2018   INR 1.1 02/24/2017   HGBA1C 6.8 (H) 12/15/2018   MICROALBUR 0.7 08/02/2014    Lab Results  Component Value Date   TSH 2.41 12/15/2018   Lab Results  Component Value Date   WBC 5.7 12/29/2018   HGB 12.2 (L) 12/29/2018   HCT 37.9 (L) 12/29/2018   MCV 95.5 12/29/2018   PLT 171 12/29/2018   Lab Results  Component Value Date   NA 139 12/29/2018   K 3.9 12/29/2018   CO2 25 12/29/2018   GLUCOSE 199 (H) 12/29/2018   BUN 28 (H) 12/29/2018   CREATININE 1.70 (H) 12/29/2018   BILITOT 0.4 12/15/2018   ALKPHOS 52 12/15/2018   AST 19 12/15/2018   ALT 15 12/15/2018   PROT 6.5 12/15/2018   ALBUMIN 4.0 12/15/2018   CALCIUM 9.6 12/29/2018   ANIONGAP 8 12/29/2018   GFR 37.32 (L) 12/15/2018   Lab Results  Component Value Date   CHOL 104 12/15/2018   Lab Results  Component Value Date   HDL 26.00 (L) 12/15/2018   Lab Results  Component Value Date   LDLCALC 38 12/15/2018   Lab Results  Component Value Date   TRIG 197.0 (H) 12/15/2018   Lab Results  Component Value Date   CHOLHDL 4 12/15/2018   Lab Results  Component Value Date   HGBA1C 6.8 (H) 12/15/2018       Assessment & Plan:   Problem List Items Addressed This Visit    Essential hypertension    Still noting BP as low as 103/58, high of 156/82. Feels best at high numbers. Has not had any presyncopal or syncopal episodes with systolic level above 706. He will continue current meds for now and may consider Splitting up his use of Furosemide to 40 mg in am and 20-40 mg q noon as needed and he is encouraged to hydrate better and avoid artificial sweeteners.       OSA (obstructive sleep apnea)    Got his new CPAP machine 2 weeks ago and is using it regularly      Urinary incontinence    Has had his urologist, Dr Lovena Neighbours change his meds due to labile BP. stopped his oxybutynin and Tamulosin last week and add Alfusozin which he feels he is tolerating  I have discontinued Elyn Aquas. Metter "Bob"'s oxybutynin and levofloxacin. I am also having him maintain his fluocinonide cream, aspirin EC, acetaminophen, B Complex Vitamins (B COMPLEX-B12 PO), albuterol, BD Insulin Syringe Ultrafine, fluticasone, insulin regular, Probiotic Product (PROBIOTIC PO), nitroGLYCERIN, insulin NPH Human, pantoprazole, glucose blood, tiZANidine, Vitamin D3, MAGNESIUM PO, HYDROcodone-acetaminophen, isosorbide mononitrate, furosemide, metoprolol tartrate, allopurinol, atorvastatin, fenofibrate, and tamsulosin.  No orders of the defined types were placed in this encounter.    I discussed the assessment and treatment plan with the patient. The patient was provided an opportunity to ask questions and all were answered. The patient agreed with the plan and demonstrated an understanding of the instructions.   The patient was advised to call back or seek an in-person evaluation if the symptoms worsen or if the condition fails to improve as anticipated.  I provided 15 minutes of non-face-to-face time during this encounter.   Penni Homans, MD

## 2019-01-25 ENCOUNTER — Telehealth: Payer: Self-pay | Admitting: Family Medicine

## 2019-01-25 ENCOUNTER — Telehealth: Payer: Self-pay

## 2019-01-25 NOTE — Telephone Encounter (Signed)
Called to schedule f/u appt 3-4 weeks from last appt (01/23/19). LVM requesting a call back to schedule.

## 2019-01-25 NOTE — Telephone Encounter (Signed)
PA approved.   PA Case: [id number], Status: Approved, Coverage Starts on: 10/12/2018 12:00:00 AM, Coverage Ends on: 10/12/2019 12:00:00 AM. Questions? Contact [phone].

## 2019-01-25 NOTE — Telephone Encounter (Signed)
PA initiated via Covermymeds; KEY: [id number]. Awaiting determination.

## 2019-01-30 DIAGNOSIS — G4733 Obstructive sleep apnea (adult) (pediatric): Secondary | ICD-10-CM | POA: Diagnosis not present

## 2019-02-02 ENCOUNTER — Telehealth: Payer: Self-pay | Admitting: Family Medicine

## 2019-02-02 NOTE — Telephone Encounter (Signed)
Copied from [place] [phone]. Topic: Quick Communication - See Telephone Encounter >> Feb 02, 2019 11:26 AM Sheran Luz wrote: CRM for notification. See Telephone encounter for: 02/02/19.  Patient's wife, Fraser Din, calling on behalf of patient. She states that patient was under the impression that he was to stop tamsulosin (FLOMAX) 0.4 MG CAPS capsule as he has been prescribed Alfuzosin by Dr. Lovena Neighbours. She stated that this was discussed with PCP previously and would like a call back from [place] for advice on what they need to do, as they are still receiving tamsulosin (FLOMAX) 0.4 MG CAPS capsule from mail order pharmacy.

## 2019-02-02 NOTE — Telephone Encounter (Signed)
That is correct the pharmacy just was not told to stop sending the Tamulosin. They can let the pharmacy know that Dr Lovena Neighbours discontinued the Tamulosin and to stop spending it.

## 2019-02-02 NOTE — Telephone Encounter (Signed)
Please advise 

## 2019-02-03 NOTE — Telephone Encounter (Signed)
Left message for patients spouse to call the pharmacy

## 2019-02-08 ENCOUNTER — Ambulatory Visit: Payer: Medicare HMO | Admitting: Pulmonary Disease

## 2019-02-08 ENCOUNTER — Telehealth: Payer: Self-pay | Admitting: Family Medicine

## 2019-02-08 NOTE — Telephone Encounter (Signed)
Copied from [place] [id number]. Topic: General - Other >> Feb 08, 2019 12:52 PM Keene Breath wrote: Reason for CRM: Patient called to inform the doctor that he had a flare up of his gout.  Patient wanted to know if he should increase his dosage of the allopurinol (ZYLOPRIM) 300 MG tablet or if he needs to take something else.  Please advise and call patient to let him know as soon as possible.  CB# [phone]

## 2019-02-09 ENCOUNTER — Other Ambulatory Visit: Payer: Self-pay | Admitting: Family Medicine

## 2019-02-09 MED ORDER — METHYLPREDNISOLONE 4 MG PO TABS: ORAL_TABLET | ORAL | 0 refills | Status: DC

## 2019-02-09 NOTE — Telephone Encounter (Signed)
Called patient,left message for return call. 

## 2019-02-09 NOTE — Telephone Encounter (Signed)
He is on the Allopurinol 300 mg daily and would prefer not to increase unless we have to due to his renal issues. I will send in a prescription for a medrol dosepak which is a steroid that tapers over 5 days and should knock it out. He will need to minimize carbohydrate intake those days and eat more veg and protein so his sugars do not spike. Make him an appt for next week to assess response and decide if we need to treat more and/or check labs.

## 2019-02-09 NOTE — Telephone Encounter (Signed)
Spoke to patients wife who states patient feeling better and he sees Marine scientist. States she is going to call back tomorrow if she needs Medrol called in.

## 2019-02-15 ENCOUNTER — Telehealth: Payer: Self-pay

## 2019-02-15 NOTE — Telephone Encounter (Signed)
Please advise 

## 2019-02-15 NOTE — Telephone Encounter (Signed)
It is up to the patient if he continues with urology. If his urinary symptoms are better and he thinks the flomax is helpful I can prescribe if he would like.

## 2019-02-15 NOTE — Telephone Encounter (Signed)
Copied from [name] [phone]. Topic: General - Other >> Feb 15, 2019 12:53 PM William Hendrix, [place] C wrote: Reason for CRM: pt's spouse called In to be advised. They would like to know if pt should continue to see urology?   CB: [id number]

## 2019-02-16 DIAGNOSIS — N401 Enlarged prostate with lower urinary tract symptoms: Secondary | ICD-10-CM | POA: Diagnosis not present

## 2019-02-16 DIAGNOSIS — R3912 Poor urinary stream: Secondary | ICD-10-CM | POA: Diagnosis not present

## 2019-02-20 DIAGNOSIS — I5032 Chronic diastolic (congestive) heart failure: Secondary | ICD-10-CM | POA: Diagnosis not present

## 2019-02-20 DIAGNOSIS — I714 Abdominal aortic aneurysm, without rupture: Secondary | ICD-10-CM | POA: Diagnosis not present

## 2019-02-20 DIAGNOSIS — E1122 Type 2 diabetes mellitus with diabetic chronic kidney disease: Secondary | ICD-10-CM | POA: Diagnosis not present

## 2019-02-20 DIAGNOSIS — I2510 Atherosclerotic heart disease of native coronary artery without angina pectoris: Secondary | ICD-10-CM | POA: Diagnosis not present

## 2019-02-20 DIAGNOSIS — N183 Chronic kidney disease, stage 3 (moderate): Secondary | ICD-10-CM | POA: Diagnosis not present

## 2019-02-20 DIAGNOSIS — N189 Chronic kidney disease, unspecified: Secondary | ICD-10-CM | POA: Diagnosis not present

## 2019-02-20 DIAGNOSIS — K219 Gastro-esophageal reflux disease without esophagitis: Secondary | ICD-10-CM | POA: Diagnosis not present

## 2019-02-20 DIAGNOSIS — D631 Anemia in chronic kidney disease: Secondary | ICD-10-CM | POA: Diagnosis not present

## 2019-02-20 DIAGNOSIS — N2581 Secondary hyperparathyroidism of renal origin: Secondary | ICD-10-CM | POA: Diagnosis not present

## 2019-02-27 ENCOUNTER — Other Ambulatory Visit: Payer: Self-pay | Admitting: Nephrology

## 2019-02-27 DIAGNOSIS — N183 Chronic kidney disease, stage 3 unspecified: Secondary | ICD-10-CM

## 2019-03-01 ENCOUNTER — Encounter: Payer: Self-pay | Admitting: Primary Care

## 2019-03-01 ENCOUNTER — Other Ambulatory Visit: Payer: Self-pay

## 2019-03-01 ENCOUNTER — Ambulatory Visit: Payer: Medicare HMO | Admitting: Pulmonary Disease

## 2019-03-01 ENCOUNTER — Ambulatory Visit (INDEPENDENT_AMBULATORY_CARE_PROVIDER_SITE_OTHER): Payer: Medicare HMO | Admitting: Primary Care

## 2019-03-01 DIAGNOSIS — G4730 Sleep apnea, unspecified: Secondary | ICD-10-CM

## 2019-03-01 DIAGNOSIS — G4733 Obstructive sleep apnea (adult) (pediatric): Secondary | ICD-10-CM | POA: Diagnosis not present

## 2019-03-01 NOTE — Patient Instructions (Addendum)
Great work!! Aim to wear CPAP every night for min of 4-6 hours or more a night  Do not drive if experiencing excessive daytime fatigue or somnolence  Needs to be re-enrolled in [place] with [employer]  Follow-up in 6-8 months with Dr. Ander Slade    CPAP and BPAP Information CPAP and BPAP are methods of helping a person breathe with the use of air pressure. CPAP stands for "continuous positive airway pressure." BPAP stands for "bi-level positive airway pressure." In both methods, air is blown through your nose or mouth and into your air passages to help you breathe well. CPAP and BPAP use different amounts of pressure to blow air. With CPAP, the amount of pressure stays the same while you breathe in and out. With BPAP, the amount of pressure is increased when you breathe in (inhale) so that you can take larger breaths. Your health care provider will recommend whether CPAP or BPAP would be more helpful for you. Why are CPAP and BPAP treatments used? CPAP or BPAP can be helpful if you have:  Sleep apnea.  Chronic obstructive pulmonary disease (COPD).  Heart failure.  Medical conditions that weaken the muscles of the chest including muscular dystrophy, or neurological diseases such as amyotrophic lateral sclerosis (ALS).  Other problems that cause breathing to be weak, abnormal, or difficult. CPAP is most commonly used for obstructive sleep apnea (OSA) to keep the airways from collapsing when the muscles relax during sleep. How is CPAP or BPAP administered? Both CPAP and BPAP are provided by a small machine with a flexible plastic tube that attaches to a plastic mask. You wear the mask. Air is blown through the mask into your nose or mouth. The amount of pressure that is used to blow the air can be adjusted on the machine. Your health care provider will determine the pressure setting that should be used based on your individual needs. When should CPAP or BPAP be used? In most cases, the  mask only needs to be worn during sleep. Generally, the mask needs to be worn throughout the night and during any daytime naps. People with certain medical conditions may also need to wear the mask at other times when they are awake. Follow instructions from your health care provider about when to use the machine. What are some tips for using the mask?   Because the mask needs to be snug, some people feel trapped or closed-in (claustrophobic) when first using the mask. If you feel this way, you may need to get used to the mask. One way to do this is by holding the mask loosely over your nose or mouth and then gradually applying the mask more snugly. You can also gradually increase the amount of time that you use the mask.  Masks are available in various types and sizes. Some fit over your mouth and nose while others fit over just your nose. If your mask does not fit well, talk with your health care provider about getting a different one.  If you are using a mask that fits over your nose and you tend to breathe through your mouth, a chin strap may be applied to help keep your mouth closed.  The CPAP and BPAP machines have alarms that may sound if the mask comes off or develops a leak.  If you have trouble with the mask, it is very important that you talk with your health care provider about finding a way to make the mask easier to tolerate.  Do not stop using the mask. Stopping the use of the mask could have a negative impact on your health. What are some tips for using the machine?  Place your CPAP or BPAP machine on a secure table or stand near an electrical outlet.  Know where the on/off switch is located on the machine.  Follow instructions from your health care provider about how to set the pressure on your machine and when you should use it.  Do not eat or drink while the CPAP or BPAP machine is on. Food or fluids could get pushed into your lungs by the pressure of the CPAP or BPAP.  Do  not smoke. Tobacco smoke residue can damage the machine.  For home use, CPAP and BPAP machines can be rented or purchased through home health care companies. Many different brands of machines are available. Renting a machine before purchasing may help you find out which particular machine works well for you.  Keep the CPAP or BPAP machine and attachments clean. Ask your health care provider for specific instructions. Get help right away if:  You have redness or open areas around your nose or mouth where the mask fits.  You have trouble using the CPAP or BPAP machine.  You cannot tolerate wearing the CPAP or BPAP mask.  You have pain, discomfort, and bloating in your abdomen. Summary  CPAP and BPAP are methods of helping a person breathe with the use of air pressure.  Both CPAP and BPAP are provided by a small machine with a flexible plastic tube that attaches to a plastic mask.  If you have trouble with the mask, it is very important that you talk with your health care provider about finding a way to make the mask easier to tolerate. This information is not intended to replace advice given to you by your health care provider. Make sure you discuss any questions you have with your health care provider. Document Released: 06/26/2004 Document Revised: 05/31/2018 Document Reviewed: 08/17/2016 Elsevier Interactive Patient Education  2019 [employer].

## 2019-03-01 NOTE — Progress Notes (Signed)
Virtual Visit via Telephone Note  I connected with William Hendrix on 03/01/19 at 11:30 AM EDT by telephone and verified that I am speaking with the correct person using two identifiers.  Location: Patient: Home Provider: Office   I discussed the limitations, risks, security and privacy concerns of performing an evaluation and management service by telephone and the availability of in person appointments. I also discussed with the patient that there may be a patient responsible charge related to this service. The patient expressed understanding and agreed to proceed.  History of Present Illness: 78 year old male, former smoker quit 1997 (90 pack year hx). PMH significant for OSA, sinusitis, chronic diastolic CHF, CAD, NSTEMI, AAA, diabetes, chronic kidney disease. Patient of Dr. Toya Smothers, seen for initial sleep consult on 11/16/18.   03/01/2019 Patient called today for 3 month follow-up. He is doing well, has been using CPAP since 2000. Recently got a new machine in March 2020. Reports compliance, no airview data available. Auto titrate 5-15cm H20. Using nasal mask, no significant air leaks reported. States that he can not sleep without using his CPAP.    Observations/Objective:  - No shortness of breath, wheezing or cough noted during phone conversation   Assessment and Plan:  OSA: - Reports compliance and benefit from CPAP - Received new CPAP machine in March 2020 (no airview data available, needs to be re-enrolled) - Auto titrate 5-15cm H20 - No changes today  Follow Up Instructions:  - 6-8 months with Dr. Ander Slade    I discussed the assessment and treatment plan with the patient. The patient was provided an opportunity to ask questions and all were answered. The patient agreed with the plan and demonstrated an understanding of the instructions.   The patient was advised to call back or seek an in-person evaluation if the symptoms worsen or if the condition fails to improve as  anticipated.  I provided 15 minutes of non-face-to-face time during this encounter.   Martyn Ehrich, NP

## 2019-03-15 ENCOUNTER — Other Ambulatory Visit: Payer: Medicare HMO

## 2019-03-21 ENCOUNTER — Ambulatory Visit
Admission: RE | Admit: 2019-03-21 | Discharge: 2019-03-21 | Disposition: A | Discharge status: Home / Self Care | Payer: Medicare HMO | Source: Ambulatory Visit | Attending: Nephrology | Admitting: Nephrology

## 2019-03-21 DIAGNOSIS — N183 Chronic kidney disease, stage 3 unspecified: Secondary | ICD-10-CM

## 2019-03-23 DIAGNOSIS — G4733 Obstructive sleep apnea (adult) (pediatric): Secondary | ICD-10-CM | POA: Diagnosis not present

## 2019-04-01 DIAGNOSIS — G4733 Obstructive sleep apnea (adult) (pediatric): Secondary | ICD-10-CM | POA: Diagnosis not present

## 2019-04-20 ENCOUNTER — Ambulatory Visit: Payer: Medicare HMO | Admitting: Internal Medicine

## 2019-04-21 ENCOUNTER — Ambulatory Visit: Payer: Self-pay | Admitting: *Deleted

## 2019-04-21 NOTE — Telephone Encounter (Signed)
Patient's wife is calling to ask for advice. On hold for NT, but the wife hung up. Patient has diahera, and a temp of 100.6 Please advise. Thank you

## 2019-04-22 ENCOUNTER — Other Ambulatory Visit: Payer: Self-pay

## 2019-04-22 ENCOUNTER — Encounter (HOSPITAL_BASED_OUTPATIENT_CLINIC_OR_DEPARTMENT_OTHER): Payer: Self-pay | Admitting: *Deleted

## 2019-04-22 ENCOUNTER — Emergency Department (HOSPITAL_BASED_OUTPATIENT_CLINIC_OR_DEPARTMENT_OTHER): Payer: Medicare HMO

## 2019-04-22 ENCOUNTER — Emergency Department (HOSPITAL_BASED_OUTPATIENT_CLINIC_OR_DEPARTMENT_OTHER)
Admission: EM | Admit: 2019-04-22 | Discharge: 2019-04-22 | Disposition: A | Discharge status: Home / Self Care | Payer: Medicare HMO | Attending: Emergency Medicine | Admitting: Emergency Medicine

## 2019-04-22 DIAGNOSIS — I5032 Chronic diastolic (congestive) heart failure: Secondary | ICD-10-CM | POA: Insufficient documentation

## 2019-04-22 DIAGNOSIS — E1122 Type 2 diabetes mellitus with diabetic chronic kidney disease: Secondary | ICD-10-CM | POA: Diagnosis not present

## 2019-04-22 DIAGNOSIS — I252 Old myocardial infarction: Secondary | ICD-10-CM | POA: Insufficient documentation

## 2019-04-22 DIAGNOSIS — I130 Hypertensive heart and chronic kidney disease with heart failure and stage 1 through stage 4 chronic kidney disease, or unspecified chronic kidney disease: Secondary | ICD-10-CM | POA: Diagnosis not present

## 2019-04-22 DIAGNOSIS — Z955 Presence of coronary angioplasty implant and graft: Secondary | ICD-10-CM | POA: Insufficient documentation

## 2019-04-22 DIAGNOSIS — K529 Noninfective gastroenteritis and colitis, unspecified: Secondary | ICD-10-CM | POA: Diagnosis not present

## 2019-04-22 DIAGNOSIS — Z87891 Personal history of nicotine dependence: Secondary | ICD-10-CM | POA: Diagnosis not present

## 2019-04-22 DIAGNOSIS — I2510 Atherosclerotic heart disease of native coronary artery without angina pectoris: Secondary | ICD-10-CM | POA: Insufficient documentation

## 2019-04-22 DIAGNOSIS — Z85828 Personal history of other malignant neoplasm of skin: Secondary | ICD-10-CM | POA: Diagnosis not present

## 2019-04-22 DIAGNOSIS — Z7982 Long term (current) use of aspirin: Secondary | ICD-10-CM | POA: Diagnosis not present

## 2019-04-22 DIAGNOSIS — Z794 Long term (current) use of insulin: Secondary | ICD-10-CM | POA: Diagnosis not present

## 2019-04-22 DIAGNOSIS — R197 Diarrhea, unspecified: Secondary | ICD-10-CM | POA: Diagnosis present

## 2019-04-22 DIAGNOSIS — K5730 Diverticulosis of large intestine without perforation or abscess without bleeding: Secondary | ICD-10-CM | POA: Diagnosis not present

## 2019-04-22 DIAGNOSIS — K8020 Calculus of gallbladder without cholecystitis without obstruction: Secondary | ICD-10-CM | POA: Diagnosis not present

## 2019-04-22 DIAGNOSIS — N183 Chronic kidney disease, stage 3 (moderate): Secondary | ICD-10-CM | POA: Insufficient documentation

## 2019-04-22 DIAGNOSIS — I714 Abdominal aortic aneurysm, without rupture: Secondary | ICD-10-CM | POA: Diagnosis not present

## 2019-04-22 LAB — CBC WITH DIFFERENTIAL/PLATELET
Abs Immature Granulocytes: 0.02 10*3/uL (ref 0.00–0.07)
Basophils Absolute: 0 10*3/uL (ref 0.0–0.1)
Basophils Relative: 0 %
Eosinophils Absolute: 0 10*3/uL (ref 0.0–0.5)
Eosinophils Relative: 0 %
HCT: 37.6 % — ABNORMAL LOW (ref 39.0–52.0)
Hemoglobin: 12.3 g/dL — ABNORMAL LOW (ref 13.0–17.0)
Immature Granulocytes: 0 %
Lymphocytes Relative: 13 %
Lymphs Abs: 0.9 10*3/uL (ref 0.7–4.0)
MCH: 30.7 pg (ref 26.0–34.0)
MCHC: 32.7 g/dL (ref 30.0–36.0)
MCV: 93.8 fL (ref 80.0–100.0)
Monocytes Absolute: 0.5 10*3/uL (ref 0.1–1.0)
Monocytes Relative: 7 %
Neutro Abs: 5.4 10*3/uL (ref 1.7–7.7)
Neutrophils Relative %: 80 %
Platelets: 162 10*3/uL (ref 150–400)
RBC: 4.01 MIL/uL — ABNORMAL LOW (ref 4.22–5.81)
RDW: 14.6 % (ref 11.5–15.5)
WBC: 6.8 10*3/uL (ref 4.0–10.5)
nRBC: 0 % (ref 0.0–0.2)

## 2019-04-22 LAB — COMPREHENSIVE METABOLIC PANEL
ALT: 18 U/L (ref 0–44)
AST: 25 U/L (ref 15–41)
Albumin: 3.7 g/dL (ref 3.5–5.0)
Alkaline Phosphatase: 44 U/L (ref 38–126)
Anion gap: 12 (ref 5–15)
BUN: 26 mg/dL — ABNORMAL HIGH (ref 8–23)
CO2: 22 mmol/L (ref 22–32)
Calcium: 9.1 mg/dL (ref 8.9–10.3)
Chloride: 100 mmol/L (ref 98–111)
Creatinine, Ser: 1.58 mg/dL — ABNORMAL HIGH (ref 0.61–1.24)
GFR calc Af Amer: 48 mL/min — ABNORMAL LOW (ref >60)
GFR calc non Af Amer: 41 mL/min — ABNORMAL LOW (ref >60)
Glucose, Bld: 152 mg/dL — ABNORMAL HIGH (ref 70–99)
Potassium: 3.4 mmol/L — ABNORMAL LOW (ref 3.5–5.1)
Sodium: 134 mmol/L — ABNORMAL LOW (ref 135–145)
Total Bilirubin: 0.6 mg/dL (ref 0.3–1.2)
Total Protein: 6.9 g/dL (ref 6.5–8.1)

## 2019-04-22 MED ORDER — SODIUM CHLORIDE 0.9 % IV BOLUS
500.0000 mL | Freq: Once | INTRAVENOUS | Status: AC
  Administered 2019-04-22: 500 mL via INTRAVENOUS

## 2019-04-22 MED ORDER — METRONIDAZOLE 500 MG PO TABS: 500.0000 mg | ORAL_TABLET | Freq: Three times a day (TID) | ORAL | 0 refills | Status: DC

## 2019-04-22 MED ORDER — METRONIDAZOLE 500 MG PO TABS
500.0000 mg | ORAL_TABLET | Freq: Once | ORAL | Status: AC
  Administered 2019-04-22: 500 mg via ORAL
  Filled 2019-04-22: qty 1

## 2019-04-22 MED ORDER — ONDANSETRON HCL 4 MG/2ML IJ SOLN
4.0000 mg | Freq: Once | INTRAMUSCULAR | Status: AC
  Administered 2019-04-22: 4 mg via INTRAVENOUS
  Filled 2019-04-22: qty 2

## 2019-04-22 MED ORDER — ONDANSETRON 8 MG PO TBDP: ORAL_TABLET | ORAL | 0 refills | Status: DC

## 2019-04-22 NOTE — Discharge Instructions (Signed)
Flagyl as prescribed.  Zofran as prescribed as needed for nausea.  Return to the emergency department if you develop severe abdominal pain, high fever, bloody stool, or other new and concerning symptoms.

## 2019-04-22 NOTE — ED Provider Notes (Signed)
[facility] EMERGENCY DEPARTMENT Provider Note   CSN: [id number] Arrival date & time: 04/22/19  1857     History   Chief Complaint Chief Complaint  Patient presents with  . Diarrhea  . Fever    HPI William Hendrix is a 78 y.o. male.     Patient is a 78 year old male with past medical history of coronary artery disease with multiple stents, CHF, chronic renal insufficiency, AAA under observation, hypertension, and diabetes.  He presents today for evaluation of diarrhea.  He states he has had multiple episodes of diarrhea over the past several days.  This is been watery and non-bloody.  He denies any fevers or chills.  He denies any vomiting.  The history is provided by the patient.  Diarrhea Quality:  Watery Severity:  Moderate Onset quality:  Gradual Duration:  3 days Timing:  Constant Relieved by:  Nothing Worsened by:  Nothing Ineffective treatments:  None tried   Past Medical History:  Diagnosis Date  . AAA (abdominal aortic aneurysm) ([place]) 12/15/2014   a. Mild aneurysmal dilatation of the infrarenal abdominal aorta 3.2 cm - f/u due by 2019.  . Arthritis    "all over" (07/30/2016)  . CAD (coronary artery disease)    a. stent to LAD 2000. b. possible spasm by cath 2001. c. IVUS/PTCA/DES to mLAD 05/2013. d. PTCA/DES of dRCA into ostial rPDA 07/2013. e. PTCA of OM2, DES to Cx, DES to [facility] 07/2016.  Marland Kitchen Chronic bronchitis ([place])   . Chronic diastolic CHF (congestive heart failure) ([place])   . Chronic lower back pain   . Chronic neck pain   . CKD (chronic kidney disease), stage III ([place])   . Diabetic peripheral neuropathy ([place])   . Ejection fraction    55%, 07/2010, mild inferior hypo  . GERD (gastroesophageal reflux disease)   . Gout   . H/O diverticulitis of colon 01/31/2015  . H/O hiatal hernia   . History of blood transfusion ~ 10/1940   "had pneumonia"  . History of kidney stones   . HTN (hypertension)   . Hyperlipidemia   . Melanoma of forearm, left  ([place]) 02/2011   with wide excision   . Myocardial infarction ([place])   . Nephrolithiasis   . Obesity   . OSA on CPAP     [name] since 2000  . Peripheral neuropathy    both feet  . Positive D-dimer    a. significant elevation ,hospital 07/2010, etiology unclear. b. D Dimer chronically > 20.  Marland Kitchen Renal insufficiency   . Skin cancer   . Spinal stenosis    a. s/p surgical repair 2013  . Spinal stenosis of lumbar region   . Type II diabetes mellitus ([place])   . Ventral hernia     Patient Active Problem List   Diagnosis Date Noted  . Acute renal failure superimposed on stage 3 chronic kidney disease ([place]) 11/12/2018  . Chronic diastolic CHF (congestive heart failure) ([place]) 11/12/2018  . AKI (acute kidney injury) ([place])   . Overactive bladder 10/31/2018  . Acute diastolic CHF (congestive heart failure) ([place])   . Chest pain 09/15/2018  . UTI (urinary tract infection) 08/01/2018  . Muscle cramps 06/13/2018  . Urinary incontinence 05/01/2018  . Anemia 11/07/2017  . Constipation 11/07/2017  . Lumbar stenosis with neurogenic claudication 10/22/2017  . Back pain 08/29/2017  . Right-sided low back pain with right-sided sciatica 11/22/2016  . Pressure injury of skin 08/03/2016  . Dyspnea 08/01/2016  . NSTEMI (  non-ST elevated myocardial infarction) ([place]) 07/30/2016  . CAD in native artery   . Obesity   . Uncontrolled type 2 diabetes mellitus with circulatory disorder, with long-term current use of insulin ([place]) 11/28/2015  . Lumbar radiculopathy 10/09/2015  . Neuropathy, diabetic ([place]) 05/22/2015  . H/O diverticulitis of colon 01/31/2015  . AAA (abdominal aortic aneurysm) ([place]) 01/01/2015  . Sinusitis 10/10/2014  . Right hip pain 07/05/2014  . Palpitations 02/27/2014  . Claudication of lower extremity ([place]) 10/25/2013  . BPH with obstruction/lower urinary tract symptoms 10/25/2013  . Hypertriglyceridemia 08/15/2012  . Melanoma ([place])   . Dizziness   . Renal insufficiency   . Coronary  artery disease with exertional angina ([place])   . Hyperlipidemia   . Essential hypertension   . OSA (obstructive sleep apnea)   . Ejection fraction   . SOB (shortness of breath)   . PARESTHESIA 05/30/2009  . EDEMA 01/22/2009  . OTHER ABNORMAL BLOOD CHEMISTRY 04/25/2008  . Gout 10/21/2007  . Depression 10/21/2007  . VENTRAL HERNIA 10/21/2007  . HIATAL HERNIA 10/21/2007  . FATIGUE 10/21/2007  . SKIN CANCER, HX OF 10/21/2007  . NEPHROLITHIASIS, HX OF 10/21/2007    Past Surgical History:  Procedure Laterality Date  . ANTERIOR LAT LUMBAR FUSION  10/22/2017   Procedure: Lumbar two-three Lumbar three-four Lumbar four-five Anteriolateral lumbar interbody fusion with percutaneous pedicle screw fixation and infuse;  Surgeon: [name], MD;  Location: [facility];  Service: Neurosurgery;;  . APPLICATION OF ROBOTIC ASSISTANCE FOR SPINAL PROCEDURE  10/22/2017   Procedure: APPLICATION OF ROBOTIC ASSISTANCE FOR SPINAL PROCEDURE;  Surgeon: [name], MD;  Location: [facility];  Service: Neurosurgery;;  . BACK SURGERY    . CARDIAC CATHETERIZATION N/A 07/30/2016   Procedure: Left Heart Cath and Coronary Angiography;  Surgeon: Nelva Bush, MD;  Location: [facility] CV LAB;  Service: Cardiovascular;  Laterality: N/A;  . CARDIAC CATHETERIZATION N/A 07/30/2016   Procedure: Coronary Stent Intervention;  Surgeon: Nelva Bush, MD;  Location: [facility] CV LAB;  Service: Cardiovascular;  Laterality: N/A;  Mid CFX and MID RCA  . CARDIAC CATHETERIZATION N/A 07/30/2016   Procedure: Coronary Balloon Angioplasty;  Surgeon: Nelva Bush, MD;  Location: [facility] CV LAB;  Service: Cardiovascular;  Laterality: N/A;  OM 1  . CARPAL TUNNEL RELEASE Left   . CATARACT EXTRACTION W/ INTRAOCULAR LENS  IMPLANT, BILATERAL Bilateral ~ 2014  . CERVICAL DISC SURGERY  1990s  . CORONARY ANGIOPLASTY WITH STENT PLACEMENT  05/2013  . CORONARY ANGIOPLASTY WITH STENT PLACEMENT  07/26/2013   DES to RCA extending to PDA    .  CORONARY ANGIOPLASTY WITH STENT PLACEMENT  07/30/2016  . CORONARY ANGIOPLASTY WITH STENT PLACEMENT  2000   CAD  . DENTAL SURGERY  04/2016   "got infected; had to dig it out"  . EYE SURGERY    . KNEE ARTHROSCOPY Right 1990's   rt  . LEFT AND RIGHT HEART CATHETERIZATION WITH CORONARY ANGIOGRAM N/A 07/26/2013   Procedure: LEFT AND RIGHT HEART CATHETERIZATION WITH CORONARY ANGIOGRAM;  Surgeon: [name], MD;  Location: [facility] CATH LAB;  Service: Cardiovascular;  Laterality: N/A;  . LEFT HEART CATH AND CORONARY ANGIOGRAPHY N/A 02/26/2017   Procedure: Left Heart Cath and Coronary Angiography;  Surgeon: Sherren Mocha, MD;  Location: [facility] CV LAB;  Service: Cardiovascular;  Laterality: N/A;  . LEFT HEART CATHETERIZATION WITH CORONARY ANGIOGRAM N/A 05/19/2013   Procedure: LEFT HEART CATHETERIZATION WITH CORONARY ANGIOGRAM;  Surgeon: [name], MD;  Location: [facility] CATH LAB;  Service: Cardiovascular;  Laterality: N/A;  . LUMBAR LAMINECTOMY/DECOMPRESSION MICRODISCECTOMY  08/30/2012   Procedure: LUMBAR LAMINECTOMY/DECOMPRESSION MICRODISCECTOMY 2 LEVELS;  Surgeon: [name], MD;  Location: [facility] NEURO ORS;  Service: Neurosurgery;  Laterality: Bilateral;  Bilateral Lumbar three-four Lumbar four-five Laminotomies  . LUMBAR PERCUTANEOUS PEDICLE SCREW 1 LEVEL Bilateral 10/22/2017   Procedure: LUMBAR PERCUTANEOUS PEDICLE SCREW 1 LEVEL;  Surgeon: [name], MD;  Location: [facility];  Service: Neurosurgery;  Laterality: Bilateral;  . LUMBAR SPINE SURGERY  04/07/2016   Dr. Ellene Route; "?ruptured disc"  . MELANOMA EXCISION Left 02/2011   forearm  . NASAL SINUS SURGERY  1970s   "cut windows in sinus pockets"  . PERCUTANEOUS CORONARY STENT INTERVENTION (PCI-S)  05/19/2013   Procedure: PERCUTANEOUS CORONARY STENT INTERVENTION (PCI-S);  Surgeon: [name], MD;  Location: [facility] CATH LAB;  Service: Cardiovascular;;  . ULNAR TUNNEL RELEASE Left 04/07/2016   Dr. Ellene Route        Home Medications    Prior to  Admission medications   Medication Sig Start Date End Date Taking? Authorizing Provider  acetaminophen (TYLENOL) 500 MG tablet Take 500 mg by mouth 2 (two) times daily.     [provider]  albuterol (PROVENTIL HFA;VENTOLIN HFA) 108 (90 Base) MCG/ACT inhaler Inhale 2 puffs into the lungs every 6 (six) hours as needed for wheezing or shortness of breath. 10/30/16   Saguier, Percell Miller, PA-C  allopurinol (ZYLOPRIM) 300 MG tablet Take 1 tablet (300 mg total) by mouth daily. 01/12/19   [name], MD  aspirin EC 81 MG tablet Take 81 mg by mouth daily. 02/23/11   [name], MD  atorvastatin (LIPITOR) 40 MG tablet Take 1 tablet (40 mg total) by mouth daily. 01/12/19   [name], MD  B Complex Vitamins (B COMPLEX-B12 PO) Take 1 tablet by mouth once a week.  07/23/16   [provider]  BD INSULIN SYRINGE ULTRAFINE 31G X 5/16" 1 ML MISC USE  TO INJECT  INSULIN  FIVE  TIMES  DAILY AS INSTRUCTED 03/17/17   [name], MD  Cholecalciferol (VITAMIN D3) 25 MCG (1000 UT) CAPS Take 1,000 Units by mouth at bedtime.    [provider]  fenofibrate 160 MG tablet Take 1 tablet (160 mg total) by mouth at bedtime. 01/12/19   [name], MD  fluocinonide cream (LIDEX) 7.03 % Apply 1 application topically daily as needed (for rashes).  08/11/12   [provider]  fluticasone (FLONASE) 50 MCG/ACT nasal spray Place 2 sprays into both nostrils daily. Patient taking differently: Place 2 sprays into both nostrils daily as needed for allergies or rhinitis.  08/11/17   Saguier, Percell Miller, PA-C  furosemide (LASIX) 40 MG tablet TAKE 1 AND 1/2 TABLETS EVERY DAY 11/14/18   Eulogio Bear U, DO  glucose blood (TRUE METRIX BLOOD GLUCOSE TEST) test strip USE TO TEST BLOOD SUGAR THREE TIMES DAILY AS DIRECTED 08/01/18   [name], MD  HYDROcodone-acetaminophen (NORCO) 10-325 MG tablet Take 0.5 tablets by mouth 2 (two) times daily. 08/10/18   [provider]  insulin NPH Human  (NOVOLIN N) 100 UNIT/ML injection Inject 45 units in am and 40 in the evening Patient taking differently: Inject 45-50 Units into the skin See admin instructions. Inject 45 units into the skin before breakfast and 50 units before supper/evening meal 05/09/18   [name], MD  insulin regular (NOVOLIN R RELION) 100 units/mL injection Inject 0.25-0.3 mLs (25-30 Units total) into the skin 3 (three) times daily before  meals. Patient taking differently: Inject 35 Units into the skin 3 (three) times daily before meals.  09/17/17   [name], MD  isosorbide mononitrate (IMDUR) 30 MG 24 hr tablet Take 1 tablet (30 mg total) by mouth daily. 09/26/18   Lelon Perla, MD  MAGNESIUM PO Take 1 tablet by mouth at bedtime.    [provider]  metoprolol tartrate (LOPRESSOR) 50 MG tablet Take 0.5 tablets (25 mg total) by mouth 2 (two) times daily. 11/12/18   Geradine Girt, DO  nitroGLYCERIN (NITROSTAT) 0.4 MG SL tablet Place 1 tablet (0.4 mg total) under the tongue every 5 (five) minutes as needed for chest pain. 02/16/18   Lelon Perla, MD  pantoprazole (PROTONIX) 20 MG tablet Take 1 tablet (20 mg total) by mouth daily. Patient taking differently: Take 20 mg by mouth at bedtime.  08/01/18   [name], MD  Probiotic Product (PROBIOTIC PO) Take 1 capsule by mouth at bedtime.     [provider]  tamsulosin (FLOMAX) 0.4 MG CAPS capsule Take 1 capsule (0.4 mg total) by mouth 2 (two) times daily. 01/12/19   [name], MD  tiZANidine (ZANAFLEX) 4 MG tablet Take 4 mg by mouth 2 (two) times daily. 08/12/18   [provider]    Family History Family History  Problem Relation Age of Onset  . Cancer Mother        intestinal   . Heart disease Mother   . Cancer Father        prostate  . Migraines Daughter   . Leukemia Sister   . Heart disease Sister   . Prostate cancer Other   . Kidney cancer Other   . Cancer Other        Bladder cancer  . Coronary artery  disease Other   . Hypertension Sister   . Hypertension Brother   . Heart attack Neg Hx   . Stroke Neg Hx     Social History Social History   Tobacco Use  . Smoking status: Former Smoker    Packs/day: 3.00    Years: 30.00    Pack years: 90.00    Types: Cigarettes    Quit date: 09/17/1996    Years since quitting: 22.6  . Smokeless tobacco: Never Used  Substance Use Topics  . Alcohol use: No  . Drug use: No     Allergies   Brilinta [ticagrelor], Levemir [insulin detemir], Codeine, Lipitor [atorvastatin], and Novolog mix [insulin aspart prot & aspart]   Review of Systems Review of Systems  All other systems reviewed and are negative.    Physical Exam Updated Vital Signs BP (!) 149/59 (BP Location: Right Arm)   Pulse 98   Temp (!) 101.7 F (38.7 C) (Oral)   Resp (!) 28   Ht 5' 10.5" (1.791 m)   Wt 112.9 kg   SpO2 97%   BMI 35.22 kg/m   Physical Exam Vitals signs and nursing note reviewed.  Constitutional:      General: He is not in acute distress.    Appearance: He is well-developed. He is not diaphoretic.  HENT:     Head: Normocephalic and atraumatic.  Neck:     Musculoskeletal: Normal range of motion and neck supple.  Cardiovascular:     Rate and Rhythm: Normal rate and regular rhythm.     Heart sounds: No murmur. No friction rub.  Pulmonary:     Effort: Pulmonary effort is normal. No respiratory distress.  Breath sounds: Normal breath sounds. No wheezing or rales.  Abdominal:     General: Bowel sounds are normal. There is no distension.     Palpations: Abdomen is soft.     Tenderness: There is abdominal tenderness. There is no guarding or rebound.     Comments: There is tenderness to palpation in the periumbilical region.  Musculoskeletal: Normal range of motion.  Skin:    General: Skin is warm and dry.  Neurological:     Mental Status: He is alert and oriented to person, place, and time.     Coordination: Coordination normal.      ED  Treatments / Results  Labs (all labs ordered are listed, but only abnormal results are displayed) Labs Reviewed  COMPREHENSIVE METABOLIC PANEL  CBC WITH DIFFERENTIAL/PLATELET    EKG None  Radiology No results found.  Procedures Procedures (including critical care time)  Medications Ordered in ED Medications  sodium chloride 0.9 % bolus 500 mL (has no administration in time range)  ondansetron (ZOFRAN) injection 4 mg (has no administration in time range)     Initial Impression / Assessment and Plan / ED Course  I have reviewed the triage vital signs and the nursing notes.  Pertinent labs & imaging results that were available during my care of the patient were reviewed by me and considered in my medical decision making (see chart for details).  Patient presenting with complaints of diarrhea and abdominal cramping for the past several days.  Laboratory studies are reassuring and CT scan shows what appears to be colitis, likely infectious in etiology.  Patient will be treated with Flagyl and discharged home.  He is to return as needed for any problems.  Final Clinical Impressions(s) / ED Diagnoses   Final diagnoses:  None    ED Discharge Orders    None       Veryl Speak, MD 04/22/19 2052

## 2019-04-22 NOTE — ED Triage Notes (Signed)
Pt reports diarrhea since Thursday evening. Fever 101.7 in triage.

## 2019-04-24 NOTE — Telephone Encounter (Signed)
Pt seen in ED 7/11.

## 2019-04-27 ENCOUNTER — Ambulatory Visit: Payer: Medicare HMO | Admitting: Internal Medicine

## 2019-04-27 ENCOUNTER — Telehealth: Payer: Self-pay | Admitting: Family Medicine

## 2019-04-27 ENCOUNTER — Emergency Department (HOSPITAL_BASED_OUTPATIENT_CLINIC_OR_DEPARTMENT_OTHER)
Admission: EM | Admit: 2019-04-27 | Discharge: 2019-04-27 | Disposition: A | Discharge status: Home / Self Care | Payer: Medicare HMO | Attending: Emergency Medicine | Admitting: Emergency Medicine

## 2019-04-27 ENCOUNTER — Encounter: Payer: Self-pay | Admitting: Internal Medicine

## 2019-04-27 ENCOUNTER — Encounter (HOSPITAL_BASED_OUTPATIENT_CLINIC_OR_DEPARTMENT_OTHER): Payer: Self-pay

## 2019-04-27 ENCOUNTER — Other Ambulatory Visit: Payer: Self-pay

## 2019-04-27 DIAGNOSIS — I2510 Atherosclerotic heart disease of native coronary artery without angina pectoris: Secondary | ICD-10-CM | POA: Diagnosis not present

## 2019-04-27 DIAGNOSIS — E1122 Type 2 diabetes mellitus with diabetic chronic kidney disease: Secondary | ICD-10-CM | POA: Diagnosis not present

## 2019-04-27 DIAGNOSIS — Z794 Long term (current) use of insulin: Secondary | ICD-10-CM

## 2019-04-27 DIAGNOSIS — Z20828 Contact with and (suspected) exposure to other viral communicable diseases: Secondary | ICD-10-CM | POA: Diagnosis not present

## 2019-04-27 DIAGNOSIS — Z79899 Other long term (current) drug therapy: Secondary | ICD-10-CM | POA: Insufficient documentation

## 2019-04-27 DIAGNOSIS — I5032 Chronic diastolic (congestive) heart failure: Secondary | ICD-10-CM | POA: Diagnosis not present

## 2019-04-27 DIAGNOSIS — Z87891 Personal history of nicotine dependence: Secondary | ICD-10-CM | POA: Insufficient documentation

## 2019-04-27 DIAGNOSIS — Z7982 Long term (current) use of aspirin: Secondary | ICD-10-CM | POA: Insufficient documentation

## 2019-04-27 DIAGNOSIS — R509 Fever, unspecified: Secondary | ICD-10-CM | POA: Diagnosis present

## 2019-04-27 DIAGNOSIS — I252 Old myocardial infarction: Secondary | ICD-10-CM | POA: Diagnosis not present

## 2019-04-27 DIAGNOSIS — E1159 Type 2 diabetes mellitus with other circulatory complications: Secondary | ICD-10-CM

## 2019-04-27 DIAGNOSIS — I130 Hypertensive heart and chronic kidney disease with heart failure and stage 1 through stage 4 chronic kidney disease, or unspecified chronic kidney disease: Secondary | ICD-10-CM | POA: Insufficient documentation

## 2019-04-27 DIAGNOSIS — A6920 Lyme disease, unspecified: Secondary | ICD-10-CM | POA: Insufficient documentation

## 2019-04-27 DIAGNOSIS — N183 Chronic kidney disease, stage 3 (moderate): Secondary | ICD-10-CM | POA: Insufficient documentation

## 2019-04-27 DIAGNOSIS — Z03818 Encounter for observation for suspected exposure to other biological agents ruled out: Secondary | ICD-10-CM | POA: Diagnosis not present

## 2019-04-27 LAB — POCT GLYCOSYLATED HEMOGLOBIN (HGB A1C): Hemoglobin A1C: 6.3 % — AB (ref 4.0–5.6)

## 2019-04-27 MED ORDER — DOXYCYCLINE HYCLATE 100 MG PO CAPS: 100.0000 mg | ORAL_CAPSULE | Freq: Two times a day (BID) | ORAL | 0 refills | Status: AC

## 2019-04-27 MED ORDER — INSULIN NPH (HUMAN) (ISOPHANE) 100 UNIT/ML ~~LOC~~ SUSP: SUBCUTANEOUS | 3 refills | Status: DC

## 2019-04-27 MED ORDER — INSULIN REGULAR HUMAN 100 UNIT/ML IJ SOLN: 15.0000 [IU] | Freq: Three times a day (TID) | INTRAMUSCULAR | 3 refills | Status: DC

## 2019-04-27 NOTE — Telephone Encounter (Signed)
Tick Bite Concerns since Saturday has gotten worst... wife states showiing signs of LIME disease. No provider able to see patient. Directed patient to Urgent care on [address]

## 2019-04-27 NOTE — Patient Instructions (Addendum)
Please continue: Insulin Before breakfast Before lunch Before dinner  Regular 15-20 15-20 15-20 (if sugars <100, take 10 units)  NPH 30  20   If you take dinnertime insulin after dinner, take 50% of the dose.  Please return in 4 months with your sugar log.

## 2019-04-27 NOTE — Progress Notes (Signed)
Patient ID: ANDREWJAMES WEIRAUCH, male   DOB: 1940/12/27, 78 y.o.   MRN: [id number]  HPI: BANDY HONAKER is a 78 y.o.-year-old male, returning for f/u for DM2, dx 2009, insulin-dependent, uncontrolled, with complications (CAD s/p AMI 2017, CKD stage 3). Last visit 4 months ago.  At the end of 2018, he eliminated meat almost completely and his sugars improved significantly, while he also lost weight.  He continues to eat a mostly plant-based diet.  He was seen emergency room with colitis 04/22/2019.  He is still on ABx. He continues to have a little diarrhea.  He had a tick bite on leg >> wants to go to the ED to be tested and possibly treated for Lyme disease.   Last hemoglobin A1c was: Lab Results  Component Value Date   HGBA1C 6.8 (H) 12/15/2018   HGBA1C 6.1 (A) 05/10/2018   HGBA1C 5.7 01/06/2018   He is on: ReliOn insulin -but not taking consistently: Insulin Before breakfast Before lunch Before dinner  Regular 35 30-35 35 (if sugars <100, take 15 units)  NPH 45  50  If you take dinnertime insulin after dinner, take 50% of the dose. Please return in 4 months with your sugar log. We cannot use Metformin 2/2 CKD. We stopped Januvia >> could not afford ($100/3 mo)  We cannot use Levemir >> developed a severe skin reaction We stopped Amaryl 4 mg.  He checks sugars 3 times a day: - am: 84-164, 200 >> 80-147 >> 72-144, 159 >> 70-150 - Before lunch: 80-160s >> 51, 104-136, 167 >> 100-130 - before dinner:70-135 >> 61, 71-169 >> 100-140 - Before bedtime: 57, 120-145 >> 180-201 >> n/c Lowest sugar was  60 >> 50s >> 51 >> 70; it is unclear at which level he has hypoglycemia awareness. Highest sugar was  214 >> 293 (steroids) >> 201 >> 201 (ate cantaloupe).  Meals: - Breakfast: oatmeal + toast + tomatoes - Lunch: beans  - Dinner:home cooked veggies   + CKD, last BUN/creatinine was:  Lab Results  Component Value Date   BUN 26 (H) 04/22/2019   CREATININE 1.58 (H) 04/22/2019   Latest  ACR was normal: Lab Results  Component Value Date   MICRALBCREAT 5.1 08/02/2014   MICRALBCREAT 0.7 02/23/2013   Reviewed GFR levels: Lab Results  Component Value Date   GFRAA 48 (L) 04/22/2019   GFRAA 44 (L) 12/29/2018   GFRAA 39 (L) 11/12/2018   GFRAA 32 (L) 11/11/2018   GFRAA 44 (L) 09/16/2018   GFRAA 43 (L) 09/15/2018   GFRAA 46 (L) 08/21/2018   GFRAA 45 (L) 07/25/2018   GFRAA 39 (L) 04/23/2018   GFRAA 58 (L) 10/23/2017   + HL; latest lipids: Lab Results  Component Value Date   CHOL 104 12/15/2018   HDL 26.00 (L) 12/15/2018   LDLCALC 38 12/15/2018   LDLDIRECT 69.0 01/24/2018   TRIG 197.0 (H) 12/15/2018   CHOLHDL 4 12/15/2018  He is on Lipitor, fenofibrate, flaxseed oil. Pt's last eye exam was 09/2015: No DR. Had cataract sx in 2011. No numbness and tingling in his legs.  He had back surgery in 03/2016 and 10/2017.  He developed a kidney stone after his last surgery. He was admitted 2x in 2017 for: CP/SOB >> AMI.  He had 2 stents placed then. He had a cardiac cath 02/2017 >> no new blockages  PMH: Pt also also has a history of CAD, HTN, CKD stage III, history of nephrolithiasis, obesity, hiatal hernia, OSA, melanoma, spinal  stenosis.  ROS: Constitutional: no weight gain/+ weight loss, no fatigue, no subjective hyperthermia, no subjective hypothermia Eyes: no blurry vision, no xerophthalmia ENT: no sore throat, no nodules palpated in neck, no dysphagia, no odynophagia, no hoarseness Cardiovascular: no CP/no SOB/no palpitations/no leg swelling Respiratory: no cough/no SOB/no wheezing Gastrointestinal: no N/no V/+ D/no C/no acid reflux/+ resolved abdominal pain Musculoskeletal: no muscle aches/no joint aches Skin: + Rash, no hair loss Neurological: no tremors/no numbness/no tingling/no dizziness  I reviewed pt's medications, allergies, PMH, social hx, family hx, and changes were documented in the history of present illness. Otherwise, unchanged from my initial visit  note.  Past Medical History:  Diagnosis Date  . AAA (abdominal aortic aneurysm) ([place]) 12/15/2014   a. Mild aneurysmal dilatation of the infrarenal abdominal aorta 3.2 cm - f/u due by 2019.  . Arthritis    "all over" (07/30/2016)  . CAD (coronary artery disease)    a. stent to LAD 2000. b. possible spasm by cath 2001. c. IVUS/PTCA/DES to mLAD 05/2013. d. PTCA/DES of dRCA into ostial rPDA 07/2013. e. PTCA of OM2, DES to Cx, DES to [facility] 07/2016.  Marland Kitchen Chronic bronchitis ([place])   . Chronic diastolic CHF (congestive heart failure) ([place])   . Chronic lower back pain   . Chronic neck pain   . CKD (chronic kidney disease), stage III ([place])   . Diabetic peripheral neuropathy ([place])   . Ejection fraction    55%, 07/2010, mild inferior hypo  . GERD (gastroesophageal reflux disease)   . Gout   . H/O diverticulitis of colon 01/31/2015  . H/O hiatal hernia   . History of blood transfusion ~ 10/1940   "had pneumonia"  . History of kidney stones   . HTN (hypertension)   . Hyperlipidemia   . Melanoma of forearm, left ([place]) 02/2011   with wide excision   . Myocardial infarction ([place])   . Nephrolithiasis   . Obesity   . OSA on CPAP     [name] since 2000  . Peripheral neuropathy    both feet  . Positive D-dimer    a. significant elevation ,hospital 07/2010, etiology unclear. b. D Dimer chronically > 20.  Marland Kitchen Renal insufficiency   . Skin cancer   . Spinal stenosis    a. s/p surgical repair 2013  . Spinal stenosis of lumbar region   . Type II diabetes mellitus ([place])   . Ventral hernia    Past Surgical History:  Procedure Laterality Date  . ANTERIOR LAT LUMBAR FUSION  10/22/2017   Procedure: Lumbar two-three Lumbar three-four Lumbar four-five Anteriolateral lumbar interbody fusion with percutaneous pedicle screw fixation and infuse;  Surgeon: [name], MD;  Location: [facility];  Service: Neurosurgery;;  . APPLICATION OF ROBOTIC ASSISTANCE FOR SPINAL PROCEDURE  10/22/2017   Procedure: APPLICATION OF  ROBOTIC ASSISTANCE FOR SPINAL PROCEDURE;  Surgeon: [name], MD;  Location: [facility];  Service: Neurosurgery;;  . BACK SURGERY    . CARDIAC CATHETERIZATION N/A 07/30/2016   Procedure: Left Heart Cath and Coronary Angiography;  Surgeon: Nelva Bush, MD;  Location: [facility] CV LAB;  Service: Cardiovascular;  Laterality: N/A;  . CARDIAC CATHETERIZATION N/A 07/30/2016   Procedure: Coronary Stent Intervention;  Surgeon: Nelva Bush, MD;  Location: [facility] [place] CV LAB;  Service: Cardiovascular;  Laterality: N/A;  Mid CFX and MID RCA  . CARDIAC CATHETERIZATION N/A 07/30/2016   Procedure: Coronary Balloon Angioplasty;  Surgeon: Nelva Bush, MD;  Location: [facility] [facility] [facility] CV LAB;  Service: Cardiovascular;  Laterality: N/A;  OM 1  . CARPAL TUNNEL RELEASE Left   . CATARACT EXTRACTION W/ INTRAOCULAR LENS  IMPLANT, BILATERAL Bilateral ~ 2014  . CERVICAL DISC SURGERY  1990s  . CORONARY ANGIOPLASTY WITH STENT PLACEMENT  05/2013  . CORONARY ANGIOPLASTY WITH STENT PLACEMENT  07/26/2013   DES to RCA extending to PDA    . CORONARY ANGIOPLASTY WITH STENT PLACEMENT  07/30/2016  . CORONARY ANGIOPLASTY WITH STENT PLACEMENT  2000   CAD  . DENTAL SURGERY  04/2016   "got infected; had to dig it out"  . EYE SURGERY    . KNEE ARTHROSCOPY Right 1990's   rt  . LEFT AND RIGHT HEART CATHETERIZATION WITH CORONARY ANGIOGRAM N/A 07/26/2013   Procedure: LEFT AND RIGHT HEART CATHETERIZATION WITH CORONARY ANGIOGRAM;  Surgeon: [name], MD;  Location: [facility] CATH LAB;  Service: Cardiovascular;  Laterality: N/A;  . LEFT HEART CATH AND CORONARY ANGIOGRAPHY N/A 02/26/2017   Procedure: Left Heart Cath and Coronary Angiography;  Surgeon: Sherren Mocha, MD;  Location: [place] CV LAB;  Service: Cardiovascular;  Laterality: N/A;  . LEFT HEART CATHETERIZATION WITH CORONARY ANGIOGRAM N/A 05/19/2013   Procedure: LEFT HEART CATHETERIZATION WITH CORONARY ANGIOGRAM;  Surgeon: [name], MD;  Location: [facility] CATH LAB;   Service: Cardiovascular;  Laterality: N/A;  . LUMBAR LAMINECTOMY/DECOMPRESSION MICRODISCECTOMY  08/30/2012   Procedure: LUMBAR LAMINECTOMY/DECOMPRESSION MICRODISCECTOMY 2 LEVELS;  Surgeon: [name], MD;  Location: [facility] NEUR[facility] ORS;  Service: Neurosurgery;  Laterality: Bilateral;  Bilateral Lumbar three-four Lumbar four-five Laminotomies  . LUMBAR PERCUTANEOUS PEDICLE SCREW 1 LEVEL Bilateral 10/22/2017   Procedure: LUMBAR PERCUTANEOUS PEDICLE SCREW 1 LEVEL;  Surgeon: [name], MD;  Location: [facility];  Service: Neurosurgery;  Laterality: Bilateral;  . LUMBAR SPINE SURGERY  04/07/2016   Dr. Ellene Route; "?ruptured disc"  . MELANOMA EXCISION Left 02/2011   forearm  . NASAL SINUS SURGERY  1970s   "cut windows in sinus pockets"  . PERCUTANEOUS CORONARY STENT INTERVENTION (PCI-S)  05/19/2013   Procedure: PERCUTANEOUS CORONARY STENT INTERVENTION (PCI-S);  Surgeon: [name], MD;  Location: [facility] CATH LAB;  Service: Cardiovascular;;  . ULNAR TUNNEL RELEASE Left 04/07/2016   Dr. Ellene Route   Social History   Socioeconomic History  . Marital status: Married    Spouse name: Not on file  . Number of children: Not on file  . Years of education: Not on file  . Highest education level: Not on file  Occupational History  . Occupation: [employer]  . Financial resource strain: Not on file  . Food insecurity    Worry: Not on file    Inability: Not on file  . Transportation needs    Medical: Not on file    Non-medical: Not on file  Tobacco Use  . Smoking status: Former Smoker    Packs/day: 3.00    Years: 30.00    Pack years: 90.00    Types: Cigarettes    Quit date: 09/17/1996    Years since quitting: 22.6  . Smokeless tobacco: Never Used  Substance and Sexual Activity  . Alcohol use: No  . Drug use: No  . Sexual activity: Not Currently    Birth control/protection: None  Lifestyle  . Physical activity    Days per week: Not on file    Minutes per session: Not on file  . Stress: Not  on file  Relationships  . Social Herbalist on phone: Not on file    Gets together:  Not on file    Attends religious service: Not on file    Active member of club or organization: Not on file    Attends meetings of clubs or organizations: Not on file    Relationship status: Not on file  . Intimate partner violence    Fear of current or ex partner: Not on file    Emotionally abused: Not on file    Physically abused: Not on file    Forced sexual activity: Not on file  Other Topics Concern  . Not on file  Social History Narrative   Married and lives locally with his wife.  Sharyon Cable.   Current Outpatient Medications on File Prior to Visit  Medication Sig Dispense Refill  . acetaminophen (TYLENOL) 500 MG tablet Take 500 mg by mouth 2 (two) times daily.     Marland Kitchen albuterol (PROVENTIL HFA;VENTOLIN HFA) 108 (90 Base) MCG/ACT inhaler Inhale 2 puffs into the lungs every 6 (six) hours as needed for wheezing or shortness of breath. 1 Inhaler 0  . allopurinol (ZYLOPRIM) 300 MG tablet Take 1 tablet (300 mg total) by mouth daily. 90 tablet 1  . aspirin EC 81 MG tablet Take 81 mg by mouth daily.    Marland Kitchen atorvastatin (LIPITOR) 40 MG tablet Take 1 tablet (40 mg total) by mouth daily. 90 tablet 1  . B Complex Vitamins (B COMPLEX-B12 PO) Take 1 tablet by mouth once a week.     . BD INSULIN SYRINGE ULTRAFINE 31G X 5/16" 1 ML MISC USE  TO INJECT  INSULIN  FIVE  TIMES  DAILY AS INSTRUCTED 450 each 3  . Cholecalciferol (VITAMIN D3) 25 MCG (1000 UT) CAPS Take 1,000 Units by mouth at bedtime.    . fenofibrate 160 MG tablet Take 1 tablet (160 mg total) by mouth at bedtime. 90 tablet 1  . fluocinonide cream (LIDEX) 2.44 % Apply 1 application topically daily as needed (for rashes).     . fluticasone (FLONASE) 50 MCG/ACT nasal spray Place 2 sprays into both nostrils daily. (Patient taking differently: Place 2 sprays into both nostrils daily as needed for allergies or rhinitis. ) 16 g 1  . furosemide (LASIX) 40  MG tablet TAKE 1 AND 1/2 TABLETS EVERY DAY 135 tablet 3  . glucose blood (TRUE METRIX BLOOD GLUCOSE TEST) test strip USE TO TEST BLOOD SUGAR THREE TIMES DAILY AS DIRECTED 300 each 3  . HYDROcodone-acetaminophen (NORCO) 10-325 MG tablet Take 0.5 tablets by mouth 2 (two) times daily.  0  . insulin NPH Human (NOVOLIN N) 100 UNIT/ML injection Inject 45 units in am and 40 in the evening (Patient taking differently: Inject 45-50 Units into the skin See admin instructions. Inject 45 units into the skin before breakfast and 50 units before supper/evening meal) 30 mL 3  . insulin regular (NOVOLIN R RELION) 100 units/mL injection Inject 0.25-0.3 mLs (25-30 Units total) into the skin 3 (three) times daily before meals. (Patient taking differently: Inject 35 Units into the skin 3 (three) times daily before meals. ) 70 mL 3  . isosorbide mononitrate (IMDUR) 30 MG 24 hr tablet Take 1 tablet (30 mg total) by mouth daily. 90 tablet 3  . MAGNESIUM PO Take 1 tablet by mouth at bedtime.    . metoprolol tartrate (LOPRESSOR) 50 MG tablet Take 0.5 tablets (25 mg total) by mouth 2 (two) times daily.    . metroNIDAZOLE (FLAGYL) 500 MG tablet Take 1 tablet (500 mg total) by mouth 3 (three) times daily. One po  bid x 7 days 21 tablet 0  . nitroGLYCERIN (NITROSTAT) 0.4 MG SL tablet Place 1 tablet (0.4 mg total) under the tongue every 5 (five) minutes as needed for chest pain. 100 tablet 3  . ondansetron (ZOFRAN ODT) 8 MG disintegrating tablet 8mg  ODT q4 hours prn nausea 6 tablet 0  . pantoprazole (PROTONIX) 20 MG tablet Take 1 tablet (20 mg total) by mouth daily. (Patient taking differently: Take 20 mg by mouth at bedtime. ) 90 tablet 3  . Probiotic Product (PROBIOTIC PO) Take 1 capsule by mouth at bedtime.     . tamsulosin (FLOMAX) 0.4 MG CAPS capsule Take 1 capsule (0.4 mg total) by mouth 2 (two) times daily. 180 capsule 1  . tiZANidine (ZANAFLEX) 4 MG tablet Take 4 mg by mouth 2 (two) times daily.  6   No current  facility-administered medications on file prior to visit.    Allergies  Allergen Reactions  . Brilinta [Ticagrelor] Shortness Of Breath  . Levemir [Insulin Detemir] Swelling and Other (See Comments)    Patient had redness and swelling and tenderness at injection site.  . Codeine Other (See Comments)    Makes him "shakey", is OK with hydrocodone GI UPSET & TREMORS  . Lipitor [Atorvastatin] Other (See Comments)    Muscle aches; affects liver function- Patient is currently taking  . Novolog Mix [Insulin Aspart Prot & Aspart] Other (See Comments)    Causes skin to swell/itch at injection site   Family History  Problem Relation Age of Onset  . Cancer Mother        intestinal   . Heart disease Mother   . Cancer Father        prostate  . Migraines Daughter   . Leukemia Sister   . Heart disease Sister   . Prostate cancer Other   . Kidney cancer Other   . Cancer Other        Bladder cancer  . Coronary artery disease Other   . Hypertension Sister   . Hypertension Brother   . Heart attack Neg Hx   . Stroke Neg Hx     PE: BP 130/70   Pulse 70   Ht 5' 10.5" (1.791 m)   Wt 247 lb (112 kg)   SpO2 98%   BMI 34.94 kg/m  There is no height or weight on file to calculate BMI. Wt Readings from Last 3 Encounters:  04/27/19 247 lb (112 kg)  04/22/19 249 lb (112.9 kg)  12/29/18 254 lb 3.1 oz (115.3 kg)   Constitutional: overweight, in NAD Eyes: PERRLA, EOMI, no exophthalmos ENT: moist mucous membranes, no thyromegaly, no cervical lymphadenopathy Cardiovascular: RRR, No MRG Respiratory: CTA B Gastrointestinal: abdomen soft, NT, ND, BS+ Musculoskeletal: no deformities, strength intact in all 4 Skin: moist, warm, no rashes Neurological: no tremor with outstretched hands, DTR normal in all 4  ASSESSMENT: 1. DM2, insulin-dependent, uncontrolled, with complications - CAD, s/p stents - He had stenting of distal RCA/PDA in 07/2013. Prior stenting of LAD in 04/2013. 2 new stents: PCI to  left circumflex and the mid RCA on 07/30/2016. - AAA - CKD stage 3  2. HL  3.  Obesity class II BMI Classification:  < 18.5 underweight   18.5-24.9 normal weight   25.0-29.9 overweight   30.0-34.9 class I obesity   35.0-39.9 class II obesity   ? 40.0 class III obesity   PLAN:  1. Patient with history of uncontrolled type 2 diabetes, on basal/bolus insulin regimen, with NPH  and regular insulin due to price.  He had significant improvement of his sugars after eliminating meat from his diet almost completely. -At last visit, sugars are mostly at goal but he had some hyperglycemic spikes in the evening especially when he was dealing with his kidney stone/UTIs.  At that time, he was trying to take insulin doses before meals but sometimes was eating out and was taking insulin after the meal.  We discussed that in the situations he should not take the entire pre-meal dose, but only 50%, due to the possibility of developing low blood sugars.  He did have a low at 51 at that time -At this visit, sugars are almost all at goal and he tells me that they remain like this even if he misses his insulin for entire days.  However, when sugars are higher than 110-120, he is taking insulin and we discussed that in that case, he needs to take lower doses then now.  I gave him a new regimen reflecting the decreasing dose (see below) - I advised him to: Patient Instructions  Please decrease: Insulin Before breakfast Before lunch Before dinner  Regular 15-20 15-20 15-20 (if sugars <100, take 10 units)  NPH 30  20   If you take dinnertime insulin after dinner, take 50% of the dose.  Please return in 4 months with your sugar log.    - we checked his HbA1c: 6.3% (better) - advised to check sugars at different times of the day - 3x a day, rotating check times - advised for yearly eye exams >> he is not UTD -he absolutely needs to schedule this - return to clinic in 4 months      2. HL - Reviewed  latest lipid panel from 12/2018: LDL at goal, triglycerides high, HDL low Lab Results  Component Value Date   CHOL 104 12/15/2018   HDL 26.00 (L) 12/15/2018   LDLCALC 38 12/15/2018   LDLDIRECT 69.0 01/24/2018   TRIG 197.0 (H) 12/15/2018   CHOLHDL 4 12/15/2018  - Continues Lipitor and fenofibrate along with flax oil without side effects.  3.  Obesity  -He did very well on the plant-based diet and I encouraged him to continue -At last visit he gained 6 pounds, now lost 7 lbs, but possibly related to his diarrhea  [name], MD PhD [facility] Endocrinology

## 2019-04-27 NOTE — ED Triage Notes (Addendum)
Py states noticed area to left posterior LE that is red with a white circle-tick removed from area 2 weeks ago-pt states he was seen here over the weekend dx colitis-pt states family has researched and feels sx may be r/t to Lymes disease-c/o HA and joint pain-pt NAD-steady gait

## 2019-04-27 NOTE — ED Notes (Signed)
ED Provider at bedside. 

## 2019-04-27 NOTE — Addendum Note (Signed)
Addended by: Cardell Peach I on: 04/27/2019 03:27 PM   Modules accepted: Orders

## 2019-04-27 NOTE — ED Notes (Signed)
Pt verbalized understanding of dc instructions.

## 2019-04-28 NOTE — ED Provider Notes (Signed)
[facility] EMERGENCY DEPARTMENT Provider Note   CSN: [id number] Arrival date & time: 04/27/19  1650    History   Chief Complaint Chief Complaint  Patient presents with  . Tick bite    HPI William Hendrix is a 78 y.o. male.     HPI   78 year old male with complicated history listed below, including recent emergency department evaluation for fever and diarrhea found to have colitis who presents with concern for headache, body aches, polyarthralgias, and concern for tick bite with associated rash.  Patient reports 2 weeks ago he had a tick that was engorged on his left posterior lower extremity.  Reports that [place] and notes there are tiny ticks in the area that sometimes he does not notice until they become engorged.  Reports he pulled the tick off approximate 2 weeks ago, and then developed a rash encircling the area.  Reports the fevers that he had had a his recent visit 5 days ago have resolved, but he has had headache, body aches, fatigue and polyarthralgias.  Past Medical History:  Diagnosis Date  . AAA (abdominal aortic aneurysm) ([place]) 12/15/2014   a. Mild aneurysmal dilatation of the infrarenal abdominal aorta 3.2 cm - f/u due by 2019.  . Arthritis    "all over" (07/30/2016)  . CAD (coronary artery disease)    a. stent to LAD 2000. b. possible spasm by cath 2001. c. IVUS/PTCA/DES to mLAD 05/2013. d. PTCA/DES of dRCA into ostial rPDA 07/2013. e. PTCA of OM2, DES to Cx, DES to [facility] 07/2016.  Marland Kitchen Chronic bronchitis ([place])   . Chronic diastolic CHF (congestive heart failure) ([place])   . Chronic lower back pain   . Chronic neck pain   . CKD (chronic kidney disease), stage III ([place])   . Diabetic peripheral neuropathy ([place])   . Ejection fraction    55%, 07/2010, mild inferior hypo  . GERD (gastroesophageal reflux disease)   . Gout   . H/O diverticulitis of colon 01/31/2015  . H/O hiatal hernia   . History of blood transfusion ~ 10/1940   "had  pneumonia"  . History of kidney stones   . HTN (hypertension)   . Hyperlipidemia   . Melanoma of forearm, left ([place]) 02/2011   with wide excision   . Myocardial infarction ([place])   . Nephrolithiasis   . Obesity   . OSA on CPAP     [name] since 2000  . Peripheral neuropathy    both feet  . Positive D-dimer    a. significant elevation ,hospital 07/2010, etiology unclear. b. D Dimer chronically > 20.  Marland Kitchen Renal insufficiency   . Skin cancer   . Spinal stenosis    a. s/p surgical repair 2013  . Spinal stenosis of lumbar region   . Type II diabetes mellitus ([place])   . Ventral hernia     Patient Active Problem List   Diagnosis Date Noted  . Acute renal failure superimposed on stage 3 chronic kidney disease ([place]) 11/12/2018  . Chronic diastolic CHF (congestive heart failure) ([place]) 11/12/2018  . AKI (acute kidney injury) ([place])   . Overactive bladder 10/31/2018  . Acute diastolic CHF (congestive heart failure) ([place])   . Chest pain 09/15/2018  . UTI (urinary tract infection) 08/01/2018  . Muscle cramps 06/13/2018  . Urinary incontinence 05/01/2018  . Anemia 11/07/2017  . Constipation 11/07/2017  . Lumbar stenosis with neurogenic claudication 10/22/2017  . Back pain 08/29/2017  .  Right-sided low back pain with right-sided sciatica 11/22/2016  . Pressure injury of skin 08/03/2016  . Dyspnea 08/01/2016  . NSTEMI (non-ST elevated myocardial infarction) ([place]) 07/30/2016  . CAD in native artery   . Obesity   . Uncontrolled type 2 diabetes mellitus with circulatory disorder, with long-term current use of insulin ([place]) 11/28/2015  . Lumbar radiculopathy 10/09/2015  . Neuropathy, diabetic ([place]) 05/22/2015  . H/O diverticulitis of colon 01/31/2015  . AAA (abdominal aortic aneurysm) ([place]) 01/01/2015  . Sinusitis 10/10/2014  . Right hip pain 07/05/2014  . Palpitations 02/27/2014  . Claudication of lower extremity ([place]) 10/25/2013  . BPH with obstruction/lower urinary tract symptoms  10/25/2013  . Hypertriglyceridemia 08/15/2012  . Melanoma ([place])   . Dizziness   . Renal insufficiency   . Coronary artery disease with exertional angina ([place])   . Hyperlipidemia   . Essential hypertension   . OSA (obstructive sleep apnea)   . Ejection fraction   . SOB (shortness of breath)   . PARESTHESIA 05/30/2009  . EDEMA 01/22/2009  . OTHER ABNORMAL BLOOD CHEMISTRY 04/25/2008  . Gout 10/21/2007  . Depression 10/21/2007  . VENTRAL HERNIA 10/21/2007  . HIATAL HERNIA 10/21/2007  . FATIGUE 10/21/2007  . SKIN CANCER, HX OF 10/21/2007  . NEPHROLITHIASIS, HX OF 10/21/2007    Past Surgical History:  Procedure Laterality Date  . ANTERIOR LAT LUMBAR FUSION  10/22/2017   Procedure: Lumbar two-three Lumbar three-four Lumbar four-five Anteriolateral lumbar interbody fusion with percutaneous pedicle screw fixation and infuse;  Surgeon: [name], MD;  Location: [facility];  Service: Neurosurgery;;  . APPLICATION OF ROBOTIC ASSISTANCE FOR SPINAL PROCEDURE  10/22/2017   Procedure: APPLICATION OF ROBOTIC ASSISTANCE FOR SPINAL PROCEDURE;  Surgeon: [name], MD;  Location: [facility];  Service: Neurosurgery;;  . BACK SURGERY    . CARDIAC CATHETERIZATION N/A 07/30/2016   Procedure: Left Heart Cath and Coronary Angiography;  Surgeon: Nelva Bush, MD;  Location: [facility] CV LAB;  Service: Cardiovascular;  Laterality: N/A;  . CARDIAC CATHETERIZATION N/A 07/30/2016   Procedure: Coronary Stent Intervention;  Surgeon: Nelva Bush, MD;  Location: [facility] CV LAB;  Service: Cardiovascular;  Laterality: N/A;  Mid CFX and MID RCA  . CARDIAC CATHETERIZATION N/A 07/30/2016   Procedure: Coronary Balloon Angioplasty;  Surgeon: Nelva Bush, MD;  Location: [facility] CV LAB;  Service: Cardiovascular;  Laterality: N/A;  OM 1  . CARPAL TUNNEL RELEASE Left   . CATARACT EXTRACTION W/ INTRAOCULAR LENS  IMPLANT, BILATERAL Bilateral ~ 2014  . CERVICAL DISC SURGERY  1990s  . CORONARY ANGIOPLASTY WITH  STENT PLACEMENT  05/2013  . CORONARY ANGIOPLASTY WITH STENT PLACEMENT  07/26/2013   DES to RCA extending to PDA    . CORONARY ANGIOPLASTY WITH STENT PLACEMENT  07/30/2016  . CORONARY ANGIOPLASTY WITH STENT PLACEMENT  2000   CAD  . DENTAL SURGERY  04/2016   "got infected; had to dig it out"  . EYE SURGERY    . KNEE ARTHROSCOPY Right 1990's   rt  . LEFT AND RIGHT HEART CATHETERIZATION WITH CORONARY ANGIOGRAM N/A 07/26/2013   Procedure: LEFT AND RIGHT HEART CATHETERIZATION WITH CORONARY ANGIOGRAM;  Surgeon: [name], MD;  Location: [facility] CATH LAB;  Service: Cardiovascular;  Laterality: N/A;  . LEFT HEART CATH AND CORONARY ANGIOGRAPHY N/A 02/26/2017   Procedure: Left Heart Cath and Coronary Angiography;  Surgeon: Sherren Mocha, MD;  Location: [facility] CV LAB;  Service: Cardiovascular;  Laterality: N/A;  . LEFT HEART CATHETERIZATION WITH CORONARY ANGIOGRAM  N/A 05/19/2013   Procedure: LEFT HEART CATHETERIZATION WITH CORONARY ANGIOGRAM;  Surgeon: [name], MD;  Location: [facility] CATH LAB;  Service: Cardiovascular;  Laterality: N/A;  . LUMBAR LAMINECTOMY/DECOMPRESSION MICRODISCECTOMY  08/30/2012   Procedure: LUMBAR LAMINECTOMY/DECOMPRESSION MICRODISCECTOMY 2 LEVELS;  Surgeon: [name], MD;  Location: [facility] NEURO ORS;  Service: Neurosurgery;  Laterality: Bilateral;  Bilateral Lumbar three-four Lumbar four-five Laminotomies  . LUMBAR PERCUTANEOUS PEDICLE SCREW 1 LEVEL Bilateral 10/22/2017   Procedure: LUMBAR PERCUTANEOUS PEDICLE SCREW 1 LEVEL;  Surgeon: [name], MD;  Location: [facility];  Service: Neurosurgery;  Laterality: Bilateral;  . LUMBAR SPINE SURGERY  04/07/2016   Dr. Ellene Route; "?ruptured disc"  . MELANOMA EXCISION Left 02/2011   forearm  . NASAL SINUS SURGERY  1970s   "cut windows in sinus pockets"  . PERCUTANEOUS CORONARY STENT INTERVENTION (PCI-S)  05/19/2013   Procedure: PERCUTANEOUS CORONARY STENT INTERVENTION (PCI-S);  Surgeon: [name], MD;  Location[facility] C[facility] LAB;   Service: Cardiovascular;;  . ULNAR TUNNEL RELEASE Left 04/07/2016   Dr. Ellene Route        Home Medications    Prior to Admission medications   Medication Sig Start Date End Date Taking? Authorizing Provider  acetaminophen (TYLENOL) 500 MG tablet Take 500 mg by mouth 2 (two) times daily.     [provider]  albuterol (PROVENTIL HFA;VENTOLIN HFA) 108 (90 Base) MCG/ACT inhaler Inhale 2 puffs into the lungs every 6 (six) hours as needed for wheezing or shortness of breath. 10/30/16   Saguier, Percell Miller, PA-C  allopurinol (ZYLOPRIM) 300 MG tablet Take 1 tablet (300 mg total) by mouth daily. 01/12/19   [name], MD  aspirin EC 81 MG tablet Take 81 mg by mouth daily. 02/23/11   [name], MD  atorvastatin (LIPITOR) 40 MG tablet Take 1 tablet (40 mg total) by mouth daily. 01/12/19   [name], MD  B Complex Vitamins (B COMPLEX-B12 PO) Take 1 tablet by mouth once a week.  07/23/16   [provider]  BD INSULIN SYRINGE ULTRAFINE 31G X 5/16" 1 ML MISC USE  TO INJECT  INSULIN  FIVE  TIMES  DAILY AS INSTRUCTED 03/17/17   [name], MD  Cholecalciferol (VITAMIN D3) 25 MCG (1000 UT) CAPS Take 1,000 Units by mouth at bedtime.    [provider]  doxycycline (VIBRAMYCIN) 100 MG capsule Take 1 capsule (100 mg total) by mouth 2 (two) times daily for 14 days. 04/27/19 05/11/19  Gareth Morgan, MD  fenofibrate 160 MG tablet Take 1 tablet (160 mg total) by mouth at bedtime. 01/12/19   [name], MD  fluocinonide cream (LIDEX) 4.81 % Apply 1 application topically daily as needed (for rashes).  08/11/12   [provider]  fluticasone (FLONASE) 50 MCG/ACT nasal spray Place 2 sprays into both nostrils daily. Patient taking differently: Place 2 sprays into both nostrils daily as needed for allergies or rhinitis.  08/11/17   Saguier, Percell Miller, PA-C  furosemide (LASIX) 40 MG tablet TAKE 1 AND 1/2 TABLETS EVERY DAY 11/14/18   Eulogio Bear U, DO  glucose blood (TRUE  METRIX BLOOD GLUCOSE TEST) test strip USE TO TEST BLOOD SUGAR THREE TIMES DAILY AS DIRECTED 08/01/18   [name], MD  HYDROcodone-acetaminophen (NORCO) 10-325 MG tablet Take 0.5 tablets by mouth 2 (two) times daily. 08/10/18   [provider]  insulin NPH Human (NOVOLIN N) 100 UNIT/ML injection Inject under skin 30 units in am and 20 in the evening 04/27/19  [name], MD  insulin regular (NOVOLIN R RELION) 100 units/mL injection Inject 0.15-0.2 mLs (15-20 Units total) into the skin 3 (three) times daily before meals. 04/27/19   [name], MD  isosorbide mononitrate (IMDUR) 30 MG 24 hr tablet Take 1 tablet (30 mg total) by mouth daily. 09/26/18   Lelon Perla, MD  MAGNESIUM PO Take 1 tablet by mouth at bedtime.    [provider]  metoprolol tartrate (LOPRESSOR) 50 MG tablet Take 0.5 tablets (25 mg total) by mouth 2 (two) times daily. 11/12/18   Geradine Girt, DO  metroNIDAZOLE (FLAGYL) 500 MG tablet Take 1 tablet (500 mg total) by mouth 3 (three) times daily. One po bid x 7 days 04/22/19   Veryl Speak, MD  nitroGLYCERIN (NITROSTAT) 0.4 MG SL tablet Place 1 tablet (0.4 mg total) under the tongue every 5 (five) minutes as needed for chest pain. 02/16/18   Lelon Perla, MD  ondansetron (ZOFRAN ODT) 8 MG disintegrating tablet 8mg  ODT q4 hours prn nausea 04/22/19   Veryl Speak, MD  pantoprazole (PROTONIX) 20 MG tablet Take 1 tablet (20 mg total) by mouth daily. Patient taking differently: Take 20 mg by mouth at bedtime.  08/01/18   [name], MD  Probiotic Product (PROBIOTIC PO) Take 1 capsule by mouth at bedtime.     [provider]  tamsulosin (FLOMAX) 0.4 MG CAPS capsule Take 1 capsule (0.4 mg total) by mouth 2 (two) times daily. 01/12/19   [name], MD  tiZANidine (ZANAFLEX) 4 MG tablet Take 4 mg by mouth 2 (two) times daily. 08/12/18   [provider]    Family History Family History  Problem Relation Age of Onset  .  Cancer Mother        intestinal   . Heart disease Mother   . Cancer Father        prostate  . Migraines Daughter   . Leukemia Sister   . Heart disease Sister   . Prostate cancer Other   . Kidney cancer Other   . Cancer Other        Bladder cancer  . Coronary artery disease Other   . Hypertension Sister   . Hypertension Brother   . Heart attack Neg Hx   . Stroke Neg Hx     Social History Social History   Tobacco Use  . Smoking status: Former Smoker    Packs/day: 3.00    Years: 30.00    Pack years: 90.00    Types: Cigarettes    Quit date: 09/17/1996    Years since quitting: 22.6  . Smokeless tobacco: Never Used  Substance Use Topics  . Alcohol use: No  . Drug use: No     Allergies   Brilinta [ticagrelor], Levemir [insulin detemir], Bee venom, Codeine, Lipitor [atorvastatin], and Novolog mix [insulin aspart prot & aspart]   Review of Systems Review of Systems   Physical Exam Updated Vital Signs BP 135/65 (BP Location: Left Arm)   Pulse 91   Temp 98 F (36.7 C) (Oral)   Resp 18   Ht 5\' 10"  (1.778 m)   Wt 115.2 kg   SpO2 98%   BMI 36.45 kg/m   Physical Exam Vitals signs and nursing note reviewed.  Constitutional:      General: He is not in acute distress.    Appearance: He is well-developed. He is not diaphoretic.  HENT:     Head: Normocephalic and atraumatic.  Eyes:  Conjunctiva/sclera: Conjunctivae normal.  Neck:     Musculoskeletal: Normal range of motion.  Cardiovascular:     Rate and Rhythm: Normal rate and regular rhythm.  Pulmonary:     Effort: Pulmonary effort is normal. No respiratory distress.  Skin:    General: Skin is warm and dry.     Findings: Rash (bulls eye rash LLE) present.  Neurological:     Mental Status: He is alert and oriented to person, place, and time.      ED Treatments / Results  Labs (all labs ordered are listed, but only abnormal results are displayed) Labs Reviewed  NOVEL CORONAVIRUS, NAA (HOSPITAL ORDER,  SEND-OUT TO REF LAB)  LYME DISEASE, WESTERN BLOT    EKG None  Radiology No results found.  Procedures Procedures (including critical care time)  Medications Ordered in ED Medications - No data to display   Initial Impression / Assessment and Plan / ED Course  I have reviewed the triage vital signs and the nursing notes.  Pertinent labs & imaging results that were available during my care of the patient were reviewed by me and considered in my medical decision making (see chart for details).        78 year old male with complicated history listed below, including recent emergency department evaluation for fever and diarrhea found to have colitis who presents with concern for headache, body aches, polyarthralgias, and concern for tick bite with associated rash.  Rash, description of tick, and presentation consistent with likely Lyme disease and erythema migrans.  Given prescription for doxycycline, recommend primary care physician follow-up.  Lab testing sent.  Given recent emergency department evaluation with lab work, do not feel repeat labs are indicated for evaluation for other causes of fatigue.  Given the setting of pandemic, however, in the setting of recent fever, diarrhea, with headache, fatigue and body aches, COVID-19 is also in the differential diagnosis.  Ordered testing and discussed recommendation for quarantine until it returns.   William Hendrix was evaluated in Emergency Department on 04/28/2019 for the symptoms described in the history of present illness. He was evaluated in the context of the global COVID-19 pandemic, which necessitated consideration that the patient might be at risk for infection with the SARS-CoV-2 virus that causes COVID-19. Institutional protocols and algorithms that pertain to the evaluation of patients at risk for COVID-19 are in a state of rapid change based on information released by regulatory bodies including the CDC and federal and state  organizations. These policies and algorithms were followed during the patient's care in the ED.   Final Clinical Impressions(s) / ED Diagnoses   Final diagnoses:  Lyme disease    ED Discharge Orders         Ordered    doxycycline (VIBRAMYCIN) 100 MG capsule  2 times daily     04/27/19 1741           Gareth Morgan, MD 04/28/19 0144

## 2019-04-29 LAB — NOVEL CORONAVIRUS, NAA (HOSP ORDER, SEND-OUT TO REF LAB; TAT 18-24 HRS): SARS-CoV-2, NAA: NOT DETECTED

## 2019-05-01 ENCOUNTER — Other Ambulatory Visit: Payer: Self-pay | Admitting: Family Medicine

## 2019-05-01 DIAGNOSIS — G4733 Obstructive sleep apnea (adult) (pediatric): Secondary | ICD-10-CM | POA: Diagnosis not present

## 2019-05-01 MED ORDER — PANTOPRAZOLE SODIUM 20 MG PO TBEC: 20.0000 mg | DELAYED_RELEASE_TABLET | Freq: Every day | ORAL | 1 refills | Status: DC

## 2019-05-01 NOTE — Progress Notes (Signed)
HPI: FU CAD. Pt has had previous PCI of his LAD. Patient had admission October 2017 with NSTEMI. He had PCI of the circumflex and RCA with drug-eluting stents. Cardiac catheterization repeated May 2018because of dyspnea and chest pain. There was continued patency of stent segments in his RCA, left circumflex and LAD. Left ventricular end-diastolic pressure moderately elevated(23). Admitted with CHF and chest pain December 2019.  Enzymes negative.  Echocardiogram December 2019 showed normal LV function and mild left atrial enlargement. Nuclear study December 2019 showed no ischemia or infarction.  Abd ultrasound 7/20 showed 3.2 cm AAA. Since last seen,patient has recently been treated for colitis and Lyme disease.  He has lost 16 pounds.  He has dyspnea with more vigorous activities but not routine activities.  No orthopnea, PND, pedal edema, chest pain or syncope.  Current Outpatient Medications  Medication Sig Dispense Refill  . acetaminophen (TYLENOL) 500 MG tablet Take 500 mg by mouth 2 (two) times daily.     Marland Kitchen allopurinol (ZYLOPRIM) 300 MG tablet Take 1 tablet (300 mg total) by mouth daily. 90 tablet 1  . aspirin EC 81 MG tablet Take 81 mg by mouth daily.    Marland Kitchen atorvastatin (LIPITOR) 40 MG tablet Take 1 tablet (40 mg total) by mouth daily. 90 tablet 1  . B Complex Vitamins (B COMPLEX-B12 PO) Take 1 tablet by mouth once a week.     . BD INSULIN SYRINGE ULTRAFINE 31G X 5/16" 1 ML MISC USE  TO INJECT  INSULIN  FIVE  TIMES  DAILY AS INSTRUCTED 450 each 3  . Cholecalciferol (VITAMIN D3) 25 MCG (1000 UT) CAPS Take 1,000 Units by mouth at bedtime.    Marland Kitchen doxycycline (VIBRAMYCIN) 100 MG capsule Take 1 capsule (100 mg total) by mouth 2 (two) times daily for 14 days. 28 capsule 0  . fenofibrate 160 MG tablet Take 1 tablet (160 mg total) by mouth at bedtime. 90 tablet 1  . fluocinonide cream (LIDEX) 4.27 % Apply 1 application topically daily as needed (for rashes).     . furosemide (LASIX) 40 MG  tablet TAKE 1 AND 1/2 TABLETS EVERY DAY 135 tablet 3  . glucose blood (TRUE METRIX BLOOD GLUCOSE TEST) test strip USE TO TEST BLOOD SUGAR THREE TIMES DAILY AS DIRECTED 300 each 3  . HYDROcodone-acetaminophen (NORCO) 10-325 MG tablet Take 0.5 tablets by mouth 2 (two) times daily.  0  . insulin NPH Human (NOVOLIN N) 100 UNIT/ML injection Inject under skin 30 units in am and 20 in the evening 30 mL 3  . insulin regular (NOVOLIN R RELION) 100 units/mL injection Inject 0.15-0.2 mLs (15-20 Units total) into the skin 3 (three) times daily before meals. 30 mL 3  . isosorbide mononitrate (IMDUR) 30 MG 24 hr tablet Take 1 tablet (30 mg total) by mouth daily. 90 tablet 3  . MAGNESIUM PO Take 1 tablet by mouth at bedtime.    . metoprolol tartrate (LOPRESSOR) 50 MG tablet TAKE 1 TABLET TWICE DAILY (Patient taking differently: Take 25 mg by mouth 2 (two) times daily. ) 180 tablet 1  . ondansetron (ZOFRAN ODT) 8 MG disintegrating tablet 8mg  ODT q4 hours prn nausea 6 tablet 0  . pantoprazole (PROTONIX) 20 MG tablet Take 1 tablet (20 mg total) by mouth daily. 90 tablet 1  . Probiotic Product (PROBIOTIC PO) Take 1 capsule by mouth at bedtime.     . tamsulosin (FLOMAX) 0.4 MG CAPS capsule Take 1 capsule (0.4 mg total) by  mouth 2 (two) times daily. 180 capsule 1  . tiZANidine (ZANAFLEX) 4 MG tablet Take 4 mg by mouth 2 (two) times daily.  6  . albuterol (PROVENTIL HFA;VENTOLIN HFA) 108 (90 Base) MCG/ACT inhaler Inhale 2 puffs into the lungs every 6 (six) hours as needed for wheezing or shortness of breath. (Patient not taking: Reported on 05/10/2019) 1 Inhaler 0  . fluticasone (FLONASE) 50 MCG/ACT nasal spray Place 2 sprays into both nostrils daily. (Patient not taking: Reported on 05/10/2019) 16 g 1  . metroNIDAZOLE (FLAGYL) 500 MG tablet Take 1 tablet (500 mg total) by mouth 3 (three) times daily. One po bid x 7 days (Patient not taking: Reported on 05/10/2019) 21 tablet 0  . nitroGLYCERIN (NITROSTAT) 0.4 MG SL tablet  Place 1 tablet (0.4 mg total) under the tongue every 5 (five) minutes as needed for chest pain. 100 tablet 3   No current facility-administered medications for this visit.      Past Medical History:  Diagnosis Date  . AAA (abdominal aortic aneurysm) ([place]) 12/15/2014   a. Mild aneurysmal dilatation of the infrarenal abdominal aorta 3.2 cm - f/u due by 2019.  . Arthritis    "all over" (07/30/2016)  . CAD (coronary artery disease)    a. stent to LAD 2000. b. possible spasm by cath 2001. c. IVUS/PTCA/DES to mLAD 05/2013. d. PTCA/DES of dRCA into ostial rPDA 07/2013. e. PTCA of OM2, DES to Cx, DES to [facility] 07/2016.  Marland Kitchen Chronic bronchitis ([place])   . Chronic diastolic CHF (congestive heart failure) ([place])   . Chronic lower back pain   . Chronic neck pain   . CKD (chronic kidney disease), stage III ([place])   . Diabetic peripheral neuropathy ([place])   . Ejection fraction    55%, 07/2010, mild inferior hypo  . GERD (gastroesophageal reflux disease)   . Gout   . H/O diverticulitis of colon 01/31/2015  . H/O hiatal hernia   . History of blood transfusion ~ 10/1940   "had pneumonia"  . History of kidney stones   . HTN (hypertension)   . Hyperlipidemia   . Melanoma of forearm, left ([place]) 02/2011   with wide excision   . Myocardial infarction ([place])   . Nephrolithiasis   . Obesity   . OSA on CPAP     Dr William Hendrix since 2000  . Peripheral neuropathy    both feet  . Positive D-dimer    a. significant elevation ,hospital 07/2010, etiology unclear. b. D Dimer chronically > 20.  Marland Kitchen Renal insufficiency   . Skin cancer   . Spinal stenosis    a. s/p surgical repair 2013  . Spinal stenosis of lumbar region   . Type II diabetes mellitus ([place])   . Ventral hernia     Past Surgical History:  Procedure Laterality Date  . ANTERIOR LAT LUMBAR FUSION  10/22/2017   Procedure: Lumbar two-three Lumbar three-four Lumbar four-five Anteriolateral lumbar interbody fusion with percutaneous pedicle screw fixation and  infuse;  Surgeon: William Miss, MD;  Location: [place];  Service: Neurosurgery;;  . APPLICATION OF ROBOTIC ASSISTANCE FOR SPINAL PROCEDURE  10/22/2017   Procedure: APPLICATION OF ROBOTIC ASSISTANCE FOR SPINAL PROCEDURE;  Surgeon: William Miss, MD;  Location: [place];  Service: Neurosurgery;;  . BACK SURGERY    . CARDIAC CATHETERIZATION N/A 07/30/2016   Procedure: Left Heart Cath and Coronary Angiography;  Surgeon: Nelva Bush, MD;  Location: [place] CV LAB;  Service: Cardiovascular;  Laterality: N/A;  . CARDIAC CATHETERIZATION  N/A 07/30/2016   Procedure: Coronary Stent Intervention;  Surgeon: Nelva Bush, MD;  Location: [place] CV LAB;  Service: Cardiovascular;  Laterality: N/A;  Mid CFX and MID RCA  . CARDIAC CATHETERIZATION N/A 07/30/2016   Procedure: Coronary Balloon Angioplasty;  Surgeon: Nelva Bush, MD;  Location: [place] CV LAB;  Service: Cardiovascular;  Laterality: N/A;  OM 1  . CARPAL TUNNEL RELEASE Left   . CATARACT EXTRACTION W/ INTRAOCULAR LENS  IMPLANT, BILATERAL Bilateral ~ 2014  . CERVICAL DISC SURGERY  1990s  . CORONARY ANGIOPLASTY WITH STENT PLACEMENT  05/2013  . CORONARY ANGIOPLASTY WITH STENT PLACEMENT  07/26/2013   DES to RCA extending to PDA    . CORONARY ANGIOPLASTY WITH STENT PLACEMENT  07/30/2016  . CORONARY ANGIOPLASTY WITH STENT PLACEMENT  2000   CAD  . DENTAL SURGERY  04/2016   "got infected; had to dig it out"  . EYE SURGERY    . KNEE ARTHROSCOPY Right 1990's   rt  . LEFT AND RIGHT HEART CATHETERIZATION WITH CORONARY ANGIOGRAM N/A 07/26/2013   Procedure: LEFT AND RIGHT HEART CATHETERIZATION WITH CORONARY ANGIOGRAM;  Surgeon: [name], MD;  Location: [place] CATH LAB;  Service: Cardiovascular;  Laterality: N/A;  . LEFT HEART CATH AND CORONARY ANGIOGRAPHY N/A 02/26/2017   Procedure: Left Heart Cath and Coronary Angiography;  Surgeon: Sherren Mocha, MD;  Location: [place] CV LAB;  Service: Cardiovascular;  Laterality: N/A;  . LEFT  HEART CATHETERIZATION WITH CORONARY ANGIOGRAM N/A 05/19/2013   Procedure: LEFT HEART CATHETERIZATION WITH CORONARY ANGIOGRAM;  Surgeon: [name], MD;  Location: [facility] CATH LAB;  Service: Cardiovascular;  Laterality: N/A;  . LUMBAR LAMINECTOMY/DECOMPRESSION MICRODISCECTOMY  08/30/2012   Procedure: LUMBAR LAMINECTOMY/DECOMPRESSION MICRODISCECTOMY 2 LEVELS;  Surgeon: William Miss, MD;  Location: [place] [facility];  Service: Neurosurgery;  Laterality: Bilateral;  Bilateral Lumbar three-four Lumbar four-five Laminotomies  . LUMBAR PERCUTANEOUS PEDICLE SCREW 1 LEVEL Bilateral 10/22/2017   Procedure: LUMBAR PERCUTANEOUS PEDICLE SCREW 1 LEVEL;  Surgeon: William Miss, MD;  Location: [place];  Service: Neurosurgery;  Laterality: Bilateral;  . LUMBAR SPINE SURGERY  04/07/2016   Dr. Ellene Route; "?ruptured disc"  . MELANOMA EXCISION Left 02/2011   forearm  . NASAL SINUS SURGERY  1970s   "cut windows in sinus pockets"  . PERCUTANEOUS CORONARY STENT INTERVENTION (PCI-S)  05/19/2013   Procedure: PERCUTANEOUS CORONARY STENT INTERVENTION (PCI-S);  Surgeon: [name], MD;  Location: [facility] CATH LAB;  Service: Cardiovascular;;  . ULNAR TUNNEL RELEASE Left 04/07/2016   Dr. Ellene Route    Social History   Socioeconomic History  . Marital status: Married    Spouse name: Not on file  . Number of children: Not on file  . Years of education: Not on file  . Highest education level: Not on file  Occupational History  . Occupation: [employer]  . Financial resource strain: Not on file  . Food insecurity    Worry: Not on file    Inability: Not on file  . Transportation needs    Medical: Not on file    Non-medical: Not on file  Tobacco Use  . Smoking status: Former Smoker    Packs/day: 3.00    Years: 30.00    Pack years: 90.00    Types: Cigarettes    Quit date: 09/17/1996    Years since quitting: 22.6  . Smokeless tobacco: Never Used  Substance and Sexual Activity  . Alcohol use: No  . Drug use: No  .  Sexual  activity: Not on file  Lifestyle  . Physical activity    Days per week: Not on file    Minutes per session: Not on file  . Stress: Not on file  Relationships  . Social Herbalist on phone: Not on file    Gets together: Not on file    Attends religious service: Not on file    Active member of club or organization: Not on file    Attends meetings of clubs or organizations: Not on file    Relationship status: Not on file  . Intimate partner violence    Fear of current or ex partner: Not on file    Emotionally abused: Not on file    Physically abused: Not on file    Forced sexual activity: Not on file  Other Topics Concern  . Not on file  Social History Narrative   Married and lives locally with his wife.  Sharyon Cable.    Family History  Problem Relation Age of Onset  . Cancer Mother        intestinal   . Heart disease Mother   . Cancer Father        prostate  . Migraines Daughter   . Leukemia Sister   . Heart disease Sister   . Prostate cancer Other   . Kidney cancer Other   . Cancer Other        Bladder cancer  . Coronary artery disease Other   . Hypertension Sister   . Hypertension Brother   . Heart attack Neg Hx   . Stroke Neg Hx     ROS: no fevers or chills, productive cough, hemoptysis, dysphasia, odynophagia, melena, hematochezia, dysuria, hematuria, rash, seizure activity, orthopnea, PND, pedal edema, claudication. Remaining systems are negative.  Physical Exam: Well-developed well-nourished in no acute distress.  Skin is warm and dry.  HEENT is normal.  Neck is supple.  Chest is clear to auscultation with normal expansion.  Cardiovascular exam is regular rate and rhythm.  Abdominal exam nontender or distended. No masses palpated. Extremities show no edema. neuro grossly intact  A/P  1 CAD-no CP; continue medical therapy with ASA and statin.  2 hypertension-BP controlled; continue present meds and follow.   3 AAA-Fu ultrasound 7/21.   4 hyperlipidemia-continue statin.   Kirk Ruths, MD

## 2019-05-01 NOTE — Telephone Encounter (Signed)
Medication: pantoprazole (PROTONIX) 20 MG tablet [[id number]]   Has the patient contacted their pharmacy? Yes  (Agent: If no, request that the patient contact the pharmacy for the refill.) (Agent: If yes, when and what did the pharmacy advise?)  Preferred Pharmacy (with phone number or street name): [place], [place] [phone] (Phone) [phone] (Fax)    Agent: Please be advised that RX refills may take up to 3 business days. We ask that you follow-up with your pharmacy.

## 2019-05-01 NOTE — Telephone Encounter (Signed)
Refill sent.

## 2019-05-04 ENCOUNTER — Ambulatory Visit (INDEPENDENT_AMBULATORY_CARE_PROVIDER_SITE_OTHER): Payer: Medicare HMO | Admitting: Family Medicine

## 2019-05-04 ENCOUNTER — Other Ambulatory Visit: Payer: Self-pay

## 2019-05-04 DIAGNOSIS — E1159 Type 2 diabetes mellitus with other circulatory complications: Secondary | ICD-10-CM | POA: Diagnosis not present

## 2019-05-04 DIAGNOSIS — I10 Essential (primary) hypertension: Secondary | ICD-10-CM | POA: Diagnosis not present

## 2019-05-04 DIAGNOSIS — Z794 Long term (current) use of insulin: Secondary | ICD-10-CM

## 2019-05-04 DIAGNOSIS — N289 Disorder of kidney and ureter, unspecified: Secondary | ICD-10-CM | POA: Diagnosis not present

## 2019-05-04 DIAGNOSIS — IMO0002 Reserved for concepts with insufficient information to code with codable children: Secondary | ICD-10-CM

## 2019-05-04 DIAGNOSIS — A6920 Lyme disease, unspecified: Secondary | ICD-10-CM | POA: Diagnosis not present

## 2019-05-04 DIAGNOSIS — E1165 Type 2 diabetes mellitus with hyperglycemia: Secondary | ICD-10-CM

## 2019-05-04 DIAGNOSIS — Z6837 Body mass index (BMI) 37.0-37.9, adult: Secondary | ICD-10-CM

## 2019-05-04 DIAGNOSIS — E785 Hyperlipidemia, unspecified: Secondary | ICD-10-CM | POA: Diagnosis not present

## 2019-05-04 DIAGNOSIS — E66812 Obesity, class 2: Secondary | ICD-10-CM

## 2019-05-04 LAB — LYME DISEASE, WESTERN BLOT
IgG P18 Ab.: ABSENT
IgG P23 Ab.: ABSENT
IgG P28 Ab.: ABSENT
IgG P30 Ab.: ABSENT
IgG P39 Ab.: ABSENT
IgG P41 Ab.: ABSENT
IgG P45 Ab.: ABSENT
IgG P58 Ab.: ABSENT
IgG P66 Ab.: ABSENT
IgG P93 Ab.: ABSENT
IgM P23 Ab.: ABSENT
IgM P39 Ab.: ABSENT
IgM P41 Ab.: ABSENT
Lyme IgG Wb: NEGATIVE
Lyme IgM Wb: NEGATIVE

## 2019-05-04 NOTE — Assessment & Plan Note (Signed)
He is feeling better on Doxycycline

## 2019-05-04 NOTE — Assessment & Plan Note (Signed)
18# weight loss with acute illness but is eating better now.

## 2019-05-04 NOTE — Assessment & Plan Note (Signed)
Encouraged heart healthy diet, increase exercise, avoid trans fats, consider a krill oil cap daily 

## 2019-05-04 NOTE — Progress Notes (Signed)
Virtual Visit via phone Note  I connected with [name] on 05/04/19 at  5:00 PM EDT by a phone enabled telemedicine application and verified that I am speaking with the correct person using two identifiers.  Location: Patient: home Provider: home   I discussed the limitations of evaluation and management by telemedicine and the availability of in person appointments. The patient expressed understanding and agreed to proceed. [name], CMA was able to get the patient set up on visit, video    Subjective:    Patient ID: William Hendrix, male    DOB: 08-26-1941, 78 y.o.   MRN: [id number]  No chief complaint on file.   HPI Patient is in today for follow-up on chronic medical concerns and a recent trip to the ER.  He presented to the ER and I was diagnosed with Lyme disease.  He has been started on doxycycline and does think that is been helping.  He has had some loose stools since starting but no bloody or tarry stool.  No fevers or chills.  No significant abdominal cramping or other new or acute concerns noted today. Denies CP/palp/SOB/HA/congestion/fevers/GI or GU c/o. Taking meds as prescribed  Past Medical History:  Diagnosis Date  . AAA (abdominal aortic aneurysm) ([place]) 12/15/2014   a. Mild aneurysmal dilatation of the infrarenal abdominal aorta 3.2 cm - f/u due by 2019.  . Arthritis    "all over" (07/30/2016)  . CAD (coronary artery disease)    a. stent to LAD 2000. b. possible spasm by cath 2001. c. IVUS/PTCA/DES to mLAD 05/2013. d. PTCA/DES of dRCA into ostial rPDA 07/2013. e. PTCA of OM2, DES to Cx, DES to [facility] 07/2016.  Marland Kitchen Chronic bronchitis ([place])   . Chronic diastolic CHF (congestive heart failure) ([place])   . Chronic lower back pain   . Chronic neck pain   . CKD (chronic kidney disease), stage III ([place])   . Diabetic peripheral neuropathy ([place])   . Ejection fraction    55%, 07/2010, mild inferior hypo  . GERD (gastroesophageal reflux disease)   . Gout   . H/O  diverticulitis of colon 01/31/2015  . H/O hiatal hernia   . History of blood transfusion ~ 10/1940   "had pneumonia"  . History of kidney stones   . HTN (hypertension)   . Hyperlipidemia   . Melanoma of forearm, left ([place]) 02/2011   with wide excision   . Myocardial infarction ([place])   . Nephrolithiasis   . Obesity   . OSA on CPAP     [name] since 2000  . Peripheral neuropathy    both feet  . Positive D-dimer    a. significant elevation ,hospital 07/2010, etiology unclear. b. D Dimer chronically > 20.  Marland Kitchen Renal insufficiency   . Skin cancer   . Spinal stenosis    a. s/p surgical repair 2013  . Spinal stenosis of lumbar region   . Type II diabetes mellitus ([place])   . Ventral hernia     Past Surgical History:  Procedure Laterality Date  . ANTERIOR LAT LUMBAR FUSION  10/22/2017   Procedure: Lumbar two-three Lumbar three-four Lumbar four-five Anteriolateral lumbar interbody fusion with percutaneous pedicle screw fixation and infuse;  Surgeon: [name], MD;  Location: [place];  Service: Neurosurgery;;  . APPLICATION OF ROBOTIC ASSISTANCE FOR SPINAL PROCEDURE  10/22/2017   Procedure: APPLICATION OF ROBOTIC ASSISTANCE FOR SPINAL PROCEDURE;  Surgeon: [name], MD;  Location: [place];  Service: Neurosurgery;;  . Wynelle Link  SURGERY    . CARDIAC CATHETERIZATION N/A 07/30/2016   Procedure: Left Heart Cath and Coronary Angiography;  Surgeon: Nelva Bush, MD;  Location: [place] CV LAB;  Service: Cardiovascular;  Laterality: N/A;  . CARDIAC CATHETERIZATION N/A 07/30/2016   Procedure: Coronary Stent Intervention;  Surgeon: Nelva Bush, MD;  Location: [place] CV LAB;  Service: Cardiovascular;  Laterality: N/A;  Mid CFX and MID RCA  . CARDIAC CATHETERIZATION N/A 07/30/2016   Procedure: Coronary Balloon Angioplasty;  Surgeon: Nelva Bush, MD;  Location: [place] CV LAB;  Service: Cardiovascular;  Laterality: N/A;  OM 1  . CARPAL TUNNEL RELEASE Left   . CATARACT EXTRACTION W/  INTRAOCULAR LENS  IMPLANT, BILATERAL Bilateral ~ 2014  . CERVICAL DISC SURGERY  1990s  . CORONARY ANGIOPLASTY WITH STENT PLACEMENT  05/2013  . CORONARY ANGIOPLASTY WITH STENT PLACEMENT  07/26/2013   DES to RCA extending to PDA    . CORONARY ANGIOPLASTY WITH STENT PLACEMENT  07/30/2016  . CORONARY ANGIOPLASTY WITH STENT PLACEMENT  2000   CAD  . DENTAL SURGERY  04/2016   "got infected; had to dig it out"  . EYE SURGERY    . KNEE ARTHROSCOPY Right 1990's   rt  . LEFT AND RIGHT HEART CATHETERIZATION WITH CORONARY ANGIOGRAM N/A 07/26/2013   Procedure: LEFT AND RIGHT HEART CATHETERIZATION WITH CORONARY ANGIOGRAM;  Surgeon: [name], MD;  Location: [place] CATH LAB;  Service: Cardiovascular;  Laterality: N/A;  . LEFT HEART CATH AND CORONARY ANGIOGRAPHY N/A 02/26/2017   Procedure: Left Heart Cath and Coronary Angiography;  Surgeon: Sherren Mocha, MD;  Location: [place] CV LAB;  Service: Cardiovascular;  Laterality: N/A;  . LEFT HEART CATHETERIZATION WITH CORONARY ANGIOGRAM N/A 05/19/2013   Procedure: LEFT HEART CATHETERIZATION WITH CORONARY ANGIOGRAM;  Surgeon: [name], MD;  Location: [facility] CATH LAB;  Service: Cardiovascular;  Laterality: N/A;  . LUMBAR LAMINECTOMY/DECOMPRESSION MICRODISCECTOMY  08/30/2012   Procedure: LUMBAR LAMINECTOMY/DECOMPRESSION MICRODISCECTOMY 2 LEVELS;  Surgeon: [name], MD;  Location: [place] NEURO ORS;  Service: Neurosurgery;  Laterality: Bilateral;  Bilateral Lumbar three-four Lumbar four-five Laminotomies  . LUMBAR PERCUTANEOUS PEDICLE SCREW 1 LEVEL Bilateral 10/22/2017   Procedure: LUMBAR PERCUTANEOUS PEDICLE SCREW 1 LEVEL;  Surgeon: [name], MD;  Location: [place];  Service: Neurosurgery;  Laterality: Bilateral;  . LUMBAR SPINE SURGERY  04/07/2016   Dr. Ellene Route; "?ruptured disc"  . MELANOMA EXCISION Left 02/2011   forearm  . NASAL SINUS SURGERY  1970s   "cut windows in sinus pockets"  . PERCUTANEOUS CORONARY STENT INTERVENTION (PCI-S)  05/19/2013    Procedure: PERCUTANEOUS CORONARY STENT INTERVENTION (PCI-S);  Surgeon: [name], MD;  Location: [facility] CATH LAB;  Service: Cardiovascular;;  . ULNAR TUNNEL RELEASE Left 04/07/2016   Dr. Ellene Route    Family History  Problem Relation Age of Onset  . Cancer Mother        intestinal   . Heart disease Mother   . Cancer Father        prostate  . Migraines Daughter   . Leukemia Sister   . Heart disease Sister   . Prostate cancer Other   . Kidney cancer Other   . Cancer Other        Bladder cancer  . Coronary artery disease Other   . Hypertension Sister   . Hypertension Brother   . Heart attack Neg Hx   . Stroke Neg Hx     Social History   Socioeconomic History  . Marital status: Married  Spouse name: Not on file  . Number of children: Not on file  . Years of education: Not on file  . Highest education level: Not on file  Occupational History  . Occupation: [employer]  . Financial resource strain: Not on file  . Food insecurity    Worry: Not on file    Inability: Not on file  . Transportation needs    Medical: Not on file    Non-medical: Not on file  Tobacco Use  . Smoking status: Former Smoker    Packs/day: 3.00    Years: 30.00    Pack years: 90.00    Types: Cigarettes    Quit date: 09/17/1996    Years since quitting: 22.6  . Smokeless tobacco: Never Used  Substance and Sexual Activity  . Alcohol use: No  . Drug use: No  . Sexual activity: Not on file  Lifestyle  . Physical activity    Days per week: Not on file    Minutes per session: Not on file  . Stress: Not on file  Relationships  . Social Herbalist on phone: Not on file    Gets together: Not on file    Attends religious service: Not on file    Active member of club or organization: Not on file    Attends meetings of clubs or organizations: Not on file    Relationship status: Not on file  . Intimate partner violence    Fear of current or ex partner: Not on file     Emotionally abused: Not on file    Physically abused: Not on file    Forced sexual activity: Not on file  Other Topics Concern  . Not on file  Social History Narrative   Married and lives locally with his wife.  Sharyon Cable.    Outpatient Medications Prior to Visit  Medication Sig Dispense Refill  . acetaminophen (TYLENOL) 500 MG tablet Take 500 mg by mouth 2 (two) times daily.     Marland Kitchen albuterol (PROVENTIL HFA;VENTOLIN HFA) 108 (90 Base) MCG/ACT inhaler Inhale 2 puffs into the lungs every 6 (six) hours as needed for wheezing or shortness of breath. 1 Inhaler 0  . allopurinol (ZYLOPRIM) 300 MG tablet Take 1 tablet (300 mg total) by mouth daily. 90 tablet 1  . aspirin EC 81 MG tablet Take 81 mg by mouth daily.    Marland Kitchen atorvastatin (LIPITOR) 40 MG tablet Take 1 tablet (40 mg total) by mouth daily. 90 tablet 1  . B Complex Vitamins (B COMPLEX-B12 PO) Take 1 tablet by mouth once a week.     . BD INSULIN SYRINGE ULTRAFINE 31G X 5/16" 1 ML MISC USE  TO INJECT  INSULIN  FIVE  TIMES  DAILY AS INSTRUCTED 450 each 3  . Cholecalciferol (VITAMIN D3) 25 MCG (1000 UT) CAPS Take 1,000 Units by mouth at bedtime.    Marland Kitchen doxycycline (VIBRAMYCIN) 100 MG capsule Take 1 capsule (100 mg total) by mouth 2 (two) times daily for 14 days. 28 capsule 0  . fenofibrate 160 MG tablet Take 1 tablet (160 mg total) by mouth at bedtime. 90 tablet 1  . fluocinonide cream (LIDEX) 5.62 % Apply 1 application topically daily as needed (for rashes).     . fluticasone (FLONASE) 50 MCG/ACT nasal spray Place 2 sprays into both nostrils daily. (Patient taking differently: Place 2 sprays into both nostrils daily as needed for allergies or rhinitis. ) 16 g 1  . furosemide (  LASIX) 40 MG tablet TAKE 1 AND 1/2 TABLETS EVERY DAY 135 tablet 3  . glucose blood (TRUE METRIX BLOOD GLUCOSE TEST) test strip USE TO TEST BLOOD SUGAR THREE TIMES DAILY AS DIRECTED 300 each 3  . HYDROcodone-acetaminophen (NORCO) 10-325 MG tablet Take 0.5 tablets by mouth 2 (two)  times daily.  0  . insulin NPH Human (NOVOLIN N) 100 UNIT/ML injection Inject under skin 30 units in am and 20 in the evening 30 mL 3  . insulin regular (NOVOLIN R RELION) 100 units/mL injection Inject 0.15-0.2 mLs (15-20 Units total) into the skin 3 (three) times daily before meals. 30 mL 3  . isosorbide mononitrate (IMDUR) 30 MG 24 hr tablet Take 1 tablet (30 mg total) by mouth daily. 90 tablet 3  . MAGNESIUM PO Take 1 tablet by mouth at bedtime.    . metoprolol tartrate (LOPRESSOR) 50 MG tablet Take 0.5 tablets (25 mg total) by mouth 2 (two) times daily.    . metroNIDAZOLE (FLAGYL) 500 MG tablet Take 1 tablet (500 mg total) by mouth 3 (three) times daily. One po bid x 7 days 21 tablet 0  . nitroGLYCERIN (NITROSTAT) 0.4 MG SL tablet Place 1 tablet (0.4 mg total) under the tongue every 5 (five) minutes as needed for chest pain. 100 tablet 3  . ondansetron (ZOFRAN ODT) 8 MG disintegrating tablet 8mg  ODT q4 hours prn nausea 6 tablet 0  . pantoprazole (PROTONIX) 20 MG tablet Take 1 tablet (20 mg total) by mouth daily. 90 tablet 1  . Probiotic Product (PROBIOTIC PO) Take 1 capsule by mouth at bedtime.     . tamsulosin (FLOMAX) 0.4 MG CAPS capsule Take 1 capsule (0.4 mg total) by mouth 2 (two) times daily. 180 capsule 1  . tiZANidine (ZANAFLEX) 4 MG tablet Take 4 mg by mouth 2 (two) times daily.  6   No facility-administered medications prior to visit.     Allergies  Allergen Reactions  . Brilinta [Ticagrelor] Shortness Of Breath  . Levemir [Insulin Detemir] Swelling and Other (See Comments)    Patient had redness and swelling and tenderness at injection site.  . Bee Venom   . Codeine Other (See Comments)    Makes him "shakey", is OK with hydrocodone GI UPSET & TREMORS  . Lipitor [Atorvastatin] Other (See Comments)    Muscle aches; affects liver function- Patient is currently taking  . Novolog Mix [Insulin Aspart Prot & Aspart] Other (See Comments)    Causes skin to swell/itch at injection  site    Review of Systems  Constitutional: Positive for malaise/fatigue. Negative for fever.  HENT: Negative for congestion.   Eyes: Negative for blurred vision.  Respiratory: Negative for shortness of breath.   Cardiovascular: Negative for chest pain, palpitations and leg swelling.  Gastrointestinal: Negative for abdominal pain, blood in stool and nausea.  Genitourinary: Negative for dysuria and frequency.  Musculoskeletal: Positive for back pain. Negative for falls.  Skin: Negative for rash.  Neurological: Negative for dizziness, loss of consciousness and headaches.  Endo/Heme/Allergies: Negative for environmental allergies.  Psychiatric/Behavioral: Negative for depression. The patient is not nervous/anxious.        Objective:    Physical Exam  There were no vitals taken for this visit. Wt Readings from Last 3 Encounters:  04/27/19 254 lb (115.2 kg)  04/27/19 247 lb (112 kg)  04/22/19 249 lb (112.9 kg)    Diabetic Foot Exam - Simple   No data filed     Lab Results  Component  Value Date   WBC 6.8 04/22/2019   HGB 12.3 (L) 04/22/2019   HCT 37.6 (L) 04/22/2019   PLT 162 04/22/2019   GLUCOSE 152 (H) 04/22/2019   CHOL 104 12/15/2018   TRIG 197.0 (H) 12/15/2018   HDL 26.00 (L) 12/15/2018   LDLDIRECT 69.0 01/24/2018   LDLCALC 38 12/15/2018   ALT 18 04/22/2019   AST 25 04/22/2019   NA 134 (L) 04/22/2019   K 3.4 (L) 04/22/2019   CL 100 04/22/2019   CREATININE 1.58 (H) 04/22/2019   BUN 26 (H) 04/22/2019   CO2 22 04/22/2019   TSH 2.41 12/15/2018   PSA 1.01 11/02/2018   INR 1.1 02/24/2017   HGBA1C 6.3 (A) 04/27/2019   MICROALBUR 0.7 08/02/2014    Lab Results  Component Value Date   TSH 2.41 12/15/2018   Lab Results  Component Value Date   WBC 6.8 04/22/2019   HGB 12.3 (L) 04/22/2019   HCT 37.6 (L) 04/22/2019   MCV 93.8 04/22/2019   PLT 162 04/22/2019   Lab Results  Component Value Date   NA 134 (L) 04/22/2019   K 3.4 (L) 04/22/2019   CO2 22  04/22/2019   GLUCOSE 152 (H) 04/22/2019   BUN 26 (H) 04/22/2019   CREATININE 1.58 (H) 04/22/2019   BILITOT 0.6 04/22/2019   ALKPHOS 44 04/22/2019   AST 25 04/22/2019   ALT 18 04/22/2019   PROT 6.9 04/22/2019   ALBUMIN 3.7 04/22/2019   CALCIUM 9.1 04/22/2019   ANIONGAP 12 04/22/2019   GFR 37.32 (L) 12/15/2018   Lab Results  Component Value Date   CHOL 104 12/15/2018   Lab Results  Component Value Date   HDL 26.00 (L) 12/15/2018   Lab Results  Component Value Date   LDLCALC 38 12/15/2018   Lab Results  Component Value Date   TRIG 197.0 (H) 12/15/2018   Lab Results  Component Value Date   CHOLHDL 4 12/15/2018   Lab Results  Component Value Date   HGBA1C 6.3 (A) 04/27/2019       Assessment & Plan:   Problem List Items Addressed This Visit    Renal insufficiency    Hydrate and monitor      Hyperlipidemia    Encouraged heart healthy diet, increase exercise, avoid trans fats, consider a krill oil cap daily      Essential hypertension     no changes to meds. Encouraged heart healthy diet such as the DASH diet and exercise as tolerated.       Uncontrolled type 2 diabetes mellitus with circulatory disorder, with long-term current use of insulin (HCC)    minimize simple carbs. Increase exercise as tolerated. Continue current meds      Obesity    18# weight loss with acute illness but is eating better now.      Lyme disease    He is feeling better on Doxycycline          I am having Elyn Aquas. Lorain Childes "[name]" maintain his fluocinonide cream, aspirin EC, acetaminophen, B Complex Vitamins (B COMPLEX-B12 PO), albuterol, BD Insulin Syringe Ultrafine, fluticasone, Probiotic Product (PROBIOTIC PO), nitroGLYCERIN, glucose blood, tiZANidine, Vitamin D3, MAGNESIUM PO, HYDROcodone-acetaminophen, isosorbide mononitrate, furosemide, metoprolol tartrate, allopurinol, atorvastatin, fenofibrate, tamsulosin, metroNIDAZOLE, ondansetron, insulin NPH Human, insulin regular,  doxycycline, and pantoprazole.  No orders of the defined types were placed in this encounter.   I discussed the assessment and treatment plan with the patient. The patient was provided an opportunity to ask questions and all  were answered. The patient agreed with the plan and demonstrated an understanding of the instructions.   The patient was advised to call back or seek an in-person evaluation if the symptoms worsen or if the condition fails to improve as anticipated.  I provided 25 minutes of non-face-to-face time during this encounter.   Penni Homans, MD

## 2019-05-04 NOTE — Assessment & Plan Note (Signed)
Hydrate and monitor 

## 2019-05-04 NOTE — Assessment & Plan Note (Signed)
minimize simple carbs. Increase exercise as tolerated. Continue current meds  

## 2019-05-04 NOTE — Assessment & Plan Note (Addendum)
no changes to meds. Encouraged heart healthy diet such as the DASH diet and exercise as tolerated.  

## 2019-05-08 ENCOUNTER — Other Ambulatory Visit: Payer: Self-pay | Admitting: Family Medicine

## 2019-05-10 ENCOUNTER — Other Ambulatory Visit: Payer: Self-pay

## 2019-05-10 ENCOUNTER — Encounter: Payer: Self-pay | Admitting: Cardiology

## 2019-05-10 ENCOUNTER — Ambulatory Visit (INDEPENDENT_AMBULATORY_CARE_PROVIDER_SITE_OTHER): Payer: Medicare HMO | Admitting: Cardiology

## 2019-05-10 VITALS — BP 128/70 | HR 72 | Ht 70.5 in | Wt 244.0 lb

## 2019-05-10 DIAGNOSIS — I10 Essential (primary) hypertension: Secondary | ICD-10-CM

## 2019-05-10 DIAGNOSIS — E7800 Pure hypercholesterolemia, unspecified: Secondary | ICD-10-CM

## 2019-05-10 DIAGNOSIS — I714 Abdominal aortic aneurysm, without rupture, unspecified: Secondary | ICD-10-CM

## 2019-05-10 DIAGNOSIS — I2510 Atherosclerotic heart disease of native coronary artery without angina pectoris: Secondary | ICD-10-CM | POA: Diagnosis not present

## 2019-05-10 NOTE — Patient Instructions (Signed)
Medication Instructions:  NO CHANGE If you need a refill on your cardiac medications before your next appointment, please call your pharmacy.   Lab work: If you have labs (blood work) drawn today and your tests are completely normal, you will receive your results only by: . MyChart Message (if you have MyChart) OR . A paper copy in the mail If you have any lab test that is abnormal or we need to change your treatment, we will call you to review the results.  Follow-Up: At [facility], you and your health needs are our priority.  As part of our continuing mission to provide you with exceptional heart care, we have created designated Provider Care Teams.  These Care Teams include your primary Cardiologist (physician) and Advanced Practice Providers (APPs -  Physician Assistants and Nurse Practitioners) who all work together to provide you with the care you need, when you need it  Your physician recommends that you schedule a follow-up appointment in: 6 MONTHS WITH DR CRENSHAW     

## 2019-05-10 NOTE — Addendum Note (Signed)
Addended by: [name] A on: 05/10/2019 11:34 AM   Modules accepted: Orders

## 2019-05-11 ENCOUNTER — Other Ambulatory Visit (INDEPENDENT_AMBULATORY_CARE_PROVIDER_SITE_OTHER): Payer: Medicare HMO

## 2019-05-11 DIAGNOSIS — E785 Hyperlipidemia, unspecified: Secondary | ICD-10-CM | POA: Diagnosis not present

## 2019-05-11 DIAGNOSIS — I10 Essential (primary) hypertension: Secondary | ICD-10-CM | POA: Diagnosis not present

## 2019-05-11 DIAGNOSIS — E1159 Type 2 diabetes mellitus with other circulatory complications: Secondary | ICD-10-CM | POA: Diagnosis not present

## 2019-05-11 DIAGNOSIS — E1165 Type 2 diabetes mellitus with hyperglycemia: Secondary | ICD-10-CM

## 2019-05-11 DIAGNOSIS — Z794 Long term (current) use of insulin: Secondary | ICD-10-CM | POA: Diagnosis not present

## 2019-05-11 DIAGNOSIS — IMO0002 Reserved for concepts with insufficient information to code with codable children: Secondary | ICD-10-CM

## 2019-05-11 LAB — CBC
HCT: 40.3 % (ref 39.0–52.0)
Hemoglobin: 13.3 g/dL (ref 13.0–17.0)
MCHC: 33.1 g/dL (ref 30.0–36.0)
MCV: 92.1 fl (ref 78.0–100.0)
Platelets: 227 10*3/uL (ref 150.0–400.0)
RBC: 4.38 Mil/uL (ref 4.22–5.81)
RDW: 15.3 % (ref 11.5–15.5)
WBC: 6.2 10*3/uL (ref 4.0–10.5)

## 2019-05-11 LAB — COMPREHENSIVE METABOLIC PANEL
ALT: 17 U/L (ref 0–53)
AST: 20 U/L (ref 0–37)
Albumin: 4.2 g/dL (ref 3.5–5.2)
Alkaline Phosphatase: 59 U/L (ref 39–117)
BUN: 29 mg/dL — ABNORMAL HIGH (ref 6–23)
CO2: 29 mEq/L (ref 19–32)
Calcium: 10.2 mg/dL (ref 8.4–10.5)
Chloride: 104 mEq/L (ref 96–112)
Creatinine, Ser: 1.37 mg/dL (ref 0.40–1.50)
GFR: 50.1 mL/min — ABNORMAL LOW (ref >60.00)
Glucose, Bld: 110 mg/dL — ABNORMAL HIGH (ref 70–99)
Potassium: 3.9 mEq/L (ref 3.5–5.1)
Sodium: 141 mEq/L (ref 135–145)
Total Bilirubin: 0.7 mg/dL (ref 0.2–1.2)
Total Protein: 7 g/dL (ref 6.0–8.3)

## 2019-05-11 LAB — TSH: TSH: 2.81 u[IU]/mL (ref 0.35–4.50)

## 2019-05-11 LAB — LIPID PANEL
Cholesterol: 110 mg/dL (ref 0–200)
HDL: 26.1 mg/dL — ABNORMAL LOW (ref >39.00)
LDL Cholesterol: 53 mg/dL (ref 0–99)
NonHDL: 83.61
Total CHOL/HDL Ratio: 4
Triglycerides: 151 mg/dL — ABNORMAL HIGH (ref 0.0–149.0)
VLDL: 30.2 mg/dL (ref 0.0–40.0)

## 2019-05-11 LAB — HEMOGLOBIN A1C: Hgb A1c MFr Bld: 6.7 % — ABNORMAL HIGH (ref 4.6–6.5)

## 2019-05-15 DIAGNOSIS — D631 Anemia in chronic kidney disease: Secondary | ICD-10-CM | POA: Diagnosis not present

## 2019-05-15 DIAGNOSIS — N183 Chronic kidney disease, stage 3 (moderate): Secondary | ICD-10-CM | POA: Diagnosis not present

## 2019-05-15 DIAGNOSIS — K219 Gastro-esophageal reflux disease without esophagitis: Secondary | ICD-10-CM | POA: Diagnosis not present

## 2019-05-15 DIAGNOSIS — I2510 Atherosclerotic heart disease of native coronary artery without angina pectoris: Secondary | ICD-10-CM | POA: Diagnosis not present

## 2019-05-15 DIAGNOSIS — N2581 Secondary hyperparathyroidism of renal origin: Secondary | ICD-10-CM | POA: Diagnosis not present

## 2019-05-15 DIAGNOSIS — I5032 Chronic diastolic (congestive) heart failure: Secondary | ICD-10-CM | POA: Diagnosis not present

## 2019-05-15 DIAGNOSIS — I714 Abdominal aortic aneurysm, without rupture: Secondary | ICD-10-CM | POA: Diagnosis not present

## 2019-05-15 DIAGNOSIS — E1122 Type 2 diabetes mellitus with diabetic chronic kidney disease: Secondary | ICD-10-CM | POA: Diagnosis not present

## 2019-05-25 ENCOUNTER — Telehealth: Payer: Self-pay

## 2019-06-01 ENCOUNTER — Other Ambulatory Visit: Payer: Self-pay | Admitting: *Deleted

## 2019-06-01 DIAGNOSIS — G4733 Obstructive sleep apnea (adult) (pediatric): Secondary | ICD-10-CM | POA: Diagnosis not present

## 2019-06-01 NOTE — Patient Outreach (Signed)
[facility]) Care Management  06/01/2019  William Hendrix June 06, 1941 [id number]   Subjective: Telephone call to patient's home number, spoke with patient, and HIPAA verified.  Discussed [facility] Care Management [employer] Medicare Join EMMI referral follow up, patient voiced understanding, and is in agreement to follow up.  Patient states he is doing well, remembers speaking with referral source, not familiar with [facility] program, requested that this RNCM speak with wife William Hendrix) to complete assessment, wife currently not feeling well, and he requested call back to wife.   Patient gave verbal consent to speak with wife regarding his healthcare needs as needed.     Objective: Per KPN (Knowledge Performance Now, point of care tool) and chart review, patient had ED visit on 04/27/2019 for Lyme disease, ED visit on 04/22/2019 for diarrhea, fever, no recent hospitalizations.   Patient also has a history of diabetes, hypertension, Tick bite, AAA (abdominal aortic aneurysm), CAD (coronary artery disease),  Chronic diastolic CHF (congestive heart failure), CKD (chronic kidney disease), stage III, Diabetic peripheral neuropathy, gout, diverticulitis of colon, hiatal hernia, Hyperlipidemia, Melanoma of forearm, left, Myocardial infarction, Nephrolithiasis, OSA on CPAP, and Ventral hernia.       Assessment:  Received [employer] Join EMMI referral on 05/25/2019.  Referral source: [facility].   Referral reason: Patient Engagement Tool score.   Screening  follow up pending contact with patient's wife, per patient request.      Plan: RNCM will send unsuccessful outreach  letter, [facility] pamphlet, will call patient for 2nd telephone outreach attempt within 4 business days, screening follow up, and will proceed with case closure within 10 business days if no return call.     William Hendrix H. Annia Friendly, BSN, [place] [facility] Telephonic CM Phone:  [phone] Fax: [phone]

## 2019-06-02 ENCOUNTER — Other Ambulatory Visit: Payer: Self-pay | Admitting: *Deleted

## 2019-06-02 NOTE — Patient Outreach (Signed)
[facility]) Care Management  06/02/2019  William Hendrix [birth year] [id number]   Subjective: Telephone call to patient's wife/ designated party release William Hendrix) mobile number, spoke with wife, states patient's name, date of birth, and address.  Discussed [facility] EMMI referral  follow up, wife voiced understanding, and is in agreement to follow up at a later time.   States she and patient are currently out of town, requested call back next week.    States patient's primary MD has recommended the [place] [facility] program and she would like to get the patient enrolled as soon as possible upon their return home.     Objective: Per KPN (Knowledge Performance Now, point of care tool) and chart review, patient had ED visit on 04/27/2019 for Lyme disease, ED visit on 04/22/2019 for diarrhea, fever, no recent hospitalizations.   Patient also has a history of diabetes, hypertension, Tick bite, AAA (abdominal aortic aneurysm), CAD (coronary artery disease),  Chronic diastolic CHF (congestive heart failure), CKD (chronic kidney disease), stage III, Diabetic peripheral neuropathy, gout, diverticulitis of colon, hiatal hernia, Hyperlipidemia, Melanoma of forearm, left, Myocardial infarction, Nephrolithiasis, OSA on CPAP, and Ventral hernia.       Assessment:  Received [employer] Join EMMI referral on 05/25/2019.  Referral source: [facility].   Referral reason: Patient Engagement Tool score.   Screening  follow up pending contact with patient's wife, per patient request.      Plan: RNCM has sent unsuccessful outreach  letter, [facility] pamphlet, will call patient's wife for 3rd outreach attempt within 4 business days per patient's request, screening follow up, and will proceed with case closure within 10 business days if no return call.     William Hendrix H. Annia Friendly, BSN, [place] [facility] Telephonic  CM Phone: [phone] Fax: [phone]

## 2019-06-06 ENCOUNTER — Ambulatory Visit: Payer: Self-pay | Admitting: *Deleted

## 2019-06-07 ENCOUNTER — Encounter: Payer: Self-pay | Admitting: *Deleted

## 2019-06-07 ENCOUNTER — Other Ambulatory Visit: Payer: Self-pay | Admitting: *Deleted

## 2019-06-07 NOTE — Patient Outreach (Signed)
[facility]) Care Management  06/07/2019  William Hendrix 19-Apr-1941 [id number]   Subjective:  Telephone call to patient's wife / designated party release William Hendrix) mobile number, spoke with patient's wife William Hendrix) per patient's previous verbal consent and designated party release.   Wife stated patient's name, date of birth, and address.  Discussed [place] EMMI referral follow up, wife voiced understanding, and is in agreement to follow up on patient's behalf.  Wife states patient is doing well, primary MD recommended patient's enrollment in [place] Management program, wife remembers speaking with referral source, and is looking forward to being apart of the program on patient's behalf.  Wife states patient  is able to manage self care and has assistance as needed.   Wife voices understanding of patient's medical diagnosis and treatment plan.  Wife states patient has recovered from acute lyme disease symptoms and colitis, has been advised by providers long term effects of lyme disease to be determined.  Patient states she is aware of signs/ symptoms to report, how to reach patient's provider if needed after hours, when to go to ED, and / or call 911.   Discussed [facility] Care Management services, wife voiced understanding, and states patient needs additional congestive heart failure disease monitoring / education, in agreement to a referral to [place] for congestive heart failure disease monitoring /education.  States patient is managing all of his chronic conditions well, providers please with his progress, congestive heart failure, can become unstable at any time, patient weighs daily, and takes blood pressure daily. States she is accessing patient's [employer] Medicare benefits as needed via member services number on back of card on patient's behalf.   Discussed Advanced Directives, advised of [place] Management  Social Worker  Advanced Directives document completion benefit, wife voices understanding, thinks patient already has documents in place, declined to access benefit at this time, and will notify [place] Management if assistance needed in the future.  Wife states patient does not have any transition of care, care coordination, transportation, community resource, or pharmacy needs at this time.  RNCM provided wife verbally and advised wife, patient has been sent unsuccessful outreach letter with contact name and number: [phone] or main office number [phone] and 24 hour nurse advise line [phone].    States she is very appreciative of the follow up and is in agreement to receive [place] Management services on patient's behalf.     Objective:Per KPN (Knowledge Performance Now, point of care tool) and chart review,patient had ED visit on 04/27/2019 for Lyme disease, ED visit on 04/22/2019 for diarrhea, fever, no recent hospitalizations. Patient also has a history of diabetes, hypertension, Tick bite,AAA (abdominal aortic aneurysm),CAD (coronary artery disease),Chronic diastolic CHF (congestive heart failure),CKD (chronic kidney disease), stage III,Diabetic peripheral neuropathy, gout,diverticulitis of colon,hiatal hernia,Hyperlipidemia,Melanoma of forearm, left,Myocardial infarction,Nephrolithiasis,OSA on CPAP, andVentral hernia.     Assessment: Received [employer] Medicare Join EMMI referral on 05/25/2019. Referral source: [facility] [place] Management. Referral reason: Patient Engagement Tool score. Screening follow up completed with patient's wife and will refer patient to [place] for congestive heart failure disease monitoring /education.    Plan:RNCM will refer patient to referral to [place] for congestive heart failure disease monitoring and education.    William Hendrix H. Annia Friendly, BSN, [place]  Management [facility] Telephonic CM Phone: [phone] Fax: [phone]

## 2019-06-08 ENCOUNTER — Other Ambulatory Visit: Payer: Self-pay

## 2019-06-09 DIAGNOSIS — M47816 Spondylosis without myelopathy or radiculopathy, lumbar region: Secondary | ICD-10-CM | POA: Diagnosis not present

## 2019-06-13 ENCOUNTER — Other Ambulatory Visit: Payer: Self-pay

## 2019-06-14 ENCOUNTER — Other Ambulatory Visit: Payer: Self-pay

## 2019-06-15 ENCOUNTER — Ambulatory Visit (INDEPENDENT_AMBULATORY_CARE_PROVIDER_SITE_OTHER): Payer: Medicare HMO | Admitting: Family Medicine

## 2019-06-15 VITALS — BP 124/58 | HR 70 | Temp 96.8°F | Resp 18 | Wt 243.1 lb

## 2019-06-15 DIAGNOSIS — I10 Essential (primary) hypertension: Secondary | ICD-10-CM

## 2019-06-15 DIAGNOSIS — E1159 Type 2 diabetes mellitus with other circulatory complications: Secondary | ICD-10-CM

## 2019-06-15 DIAGNOSIS — E785 Hyperlipidemia, unspecified: Secondary | ICD-10-CM | POA: Diagnosis not present

## 2019-06-15 DIAGNOSIS — N183 Chronic kidney disease, stage 3 unspecified: Secondary | ICD-10-CM

## 2019-06-15 DIAGNOSIS — Z794 Long term (current) use of insulin: Secondary | ICD-10-CM

## 2019-06-15 DIAGNOSIS — F32A Depression, unspecified: Secondary | ICD-10-CM

## 2019-06-15 DIAGNOSIS — E1165 Type 2 diabetes mellitus with hyperglycemia: Secondary | ICD-10-CM

## 2019-06-15 DIAGNOSIS — IMO0002 Reserved for concepts with insufficient information to code with codable children: Secondary | ICD-10-CM

## 2019-06-15 DIAGNOSIS — F329 Major depressive disorder, single episode, unspecified: Secondary | ICD-10-CM | POA: Diagnosis not present

## 2019-06-15 DIAGNOSIS — I5032 Chronic diastolic (congestive) heart failure: Secondary | ICD-10-CM

## 2019-06-15 DIAGNOSIS — Z23 Encounter for immunization: Secondary | ICD-10-CM | POA: Diagnosis not present

## 2019-06-15 NOTE — Patient Instructions (Signed)

## 2019-06-19 NOTE — Assessment & Plan Note (Signed)
No recent exacerbation 

## 2019-06-19 NOTE — Assessment & Plan Note (Signed)
Well controlled, no changes to meds. Encouraged heart healthy diet such as the DASH diet and exercise as tolerated.  °

## 2019-06-19 NOTE — Assessment & Plan Note (Signed)
Encouraged heart healthy diet, increase exercise, avoid trans fats, consider a krill oil cap daily. Tolerating Atorvastatin 

## 2019-06-19 NOTE — Assessment & Plan Note (Signed)
Hydrate and monitor 

## 2019-06-19 NOTE — Assessment & Plan Note (Signed)
hgba1c acceptable, minimize simple carbs. Increase exercise as tolerated.  

## 2019-06-19 NOTE — Assessment & Plan Note (Signed)
Struggling with the stress of the pandemic and his daughters illness but he is doing well all things considered. No changes

## 2019-06-19 NOTE — Progress Notes (Signed)
Subjective:    Patient ID: William Hendrix, male    DOB: 1941/01/25, 78 y.o.   MRN: [id number]  No chief complaint on file.   HPI Patient is in today for follow up on chronic medical concerns including diabetes, CHF, CKD and hypertension. He is doing well and maintaining quarantine. He is denying polyuria or polydipsia. Notes ongoing stress due to his daughter's ill health but his surgery went well. No recent febrile illness or hospitalizations. Denies CP/palp/HA/congestion/fevers/GI or GU c/o. Taking meds as prescribed  Past Medical History:  Diagnosis Date  . AAA (abdominal aortic aneurysm) ([place]) 12/15/2014   a. Mild aneurysmal dilatation of the infrarenal abdominal aorta 3.2 cm - f/u due by 2019.  . Arthritis    "all over" (07/30/2016)  . CAD (coronary artery disease)    a. stent to LAD 2000. b. possible spasm by cath 2001. c. IVUS/PTCA/DES to mLAD 05/2013. d. PTCA/DES of dRCA into ostial rPDA 07/2013. e. PTCA of OM2, DES to Cx, DES to [facility] 07/2016.  Marland Kitchen Chronic bronchitis ([place])   . Chronic diastolic CHF (congestive heart failure) ([place])   . Chronic lower back pain   . Chronic neck pain   . CKD (chronic kidney disease), stage III ([place])   . Diabetic peripheral neuropathy ([place])   . Ejection fraction    55%, 07/2010, mild inferior hypo  . GERD (gastroesophageal reflux disease)   . Gout   . H/O diverticulitis of colon 01/31/2015  . H/O hiatal hernia   . History of blood transfusion ~ 10/1940   "had pneumonia"  . History of kidney stones   . HTN (hypertension)   . Hyperlipidemia   . Melanoma of forearm, left ([place]) 02/2011   with wide excision   . Myocardial infarction ([place])   . Nephrolithiasis   . Obesity   . OSA on CPAP     [name] since 2000  . Peripheral neuropathy    both feet  . Positive D-dimer    a. significant elevation ,hospital 07/2010, etiology unclear. b. D Dimer chronically > 20.  Marland Kitchen Renal insufficiency   . Skin cancer   . Spinal stenosis    a. s/p surgical  repair 2013  . Spinal stenosis of lumbar region   . Type II diabetes mellitus ([place])   . Ventral hernia     Past Surgical History:  Procedure Laterality Date  . ANTERIOR LAT LUMBAR FUSION  10/22/2017   Procedure: Lumbar two-three Lumbar three-four Lumbar four-five Anteriolateral lumbar interbody fusion with percutaneous pedicle screw fixation and infuse;  Surgeon: [name], MD;  Location: [place];  Service: Neurosurgery;;  . APPLICATION OF ROBOTIC ASSISTANCE FOR SPINAL PROCEDURE  10/22/2017   Procedure: APPLICATION OF ROBOTIC ASSISTANCE FOR SPINAL PROCEDURE;  Surgeon: [name], MD;  Location: [facility];  Service: Neurosurgery;;  . BACK SURGERY    . CARDIAC CATHETERIZATION N/A 07/30/2016   Procedure: Left Heart Cath and Coronary Angiography;  Surgeon: Nelva Bush, MD;  Location: [place] CV LAB;  Service: Cardiovascular;  Laterality: N/A;  . CARDIAC CATHETERIZATION N/A 07/30/2016   Procedure: Coronary Stent Intervention;  Surgeon: Nelva Bush, MD;  Location: [place] CV LAB;  Service: Cardiovascular;  Laterality: N/A;  Mid CFX and MID RCA  . CARDIAC CATHETERIZATION N/A 07/30/2016   Procedure: Coronary Balloon Angioplasty;  Surgeon: Nelva Bush, MD;  Location: [place] CV LAB;  Service: Cardiovascular;  Laterality: N/A;  OM 1  . CARPAL TUNNEL RELEASE Left   . CATARACT EXTRACTION W/  INTRAOCULAR LENS  IMPLANT, BILATERAL Bilateral ~ 2014  . CERVICAL DISC SURGERY  1990s  . CORONARY ANGIOPLASTY WITH STENT PLACEMENT  05/2013  . CORONARY ANGIOPLASTY WITH STENT PLACEMENT  07/26/2013   DES to RCA extending to PDA    . CORONARY ANGIOPLASTY WITH STENT PLACEMENT  07/30/2016  . CORONARY ANGIOPLASTY WITH STENT PLACEMENT  2000   CAD  . DENTAL SURGERY  04/2016   "got infected; had to dig it out"  . EYE SURGERY    . KNEE ARTHROSCOPY Right 1990's   rt  . LEFT AND RIGHT HEART CATHETERIZATION WITH CORONARY ANGIOGRAM N/A 07/26/2013   Procedure: LEFT AND RIGHT HEART CATHETERIZATION  WITH CORONARY ANGIOGRAM;  Surgeon: [name], MD;  Location: [place] CATH LAB;  Service: Cardiovascular;  Laterality: N/A;  . LEFT HEART CATH AND CORONARY ANGIOGRAPHY N/A 02/26/2017   Procedure: Left Heart Cath and Coronary Angiography;  Surgeon: Sherren Mocha, MD;  Location: [place] CV LAB;  Service: Cardiovascular;  Laterality: N/A;  . LEFT HEART CATHETERIZATION WITH CORONARY ANGIOGRAM N/A 05/19/2013   Procedure: LEFT HEART CATHETERIZATION WITH CORONARY ANGIOGRAM;  Surgeon: [name], MD;  Location: [facility] CATH LAB;  Service: Cardiovascular;  Laterality: N/A;  . LUMBAR LAMINECTOMY/DECOMPRESSION MICRODISCECTOMY  08/30/2012   Procedure: LUMBAR LAMINECTOMY/DECOMPRESSION MICRODISCECTOMY 2 LEVELS;  Surgeon: [name], MD;  Location: [facility] NEURO ORS;  Service: Neurosurgery;  Laterality: Bilateral;  Bilateral Lumbar three-four Lumbar four-five Laminotomies  . LUMBAR PERCUTANEOUS PEDICLE SCREW 1 LEVEL Bilateral 10/22/2017   Procedure: LUMBAR PERCUTANEOUS PEDICLE SCREW 1 LEVEL;  Surgeon: [name], MD;  Location: [facility];  Service: Neurosurgery;  Laterality: Bilateral;  . LUMBAR SPINE SURGERY  04/07/2016   Dr. Ellene Route; "?ruptured disc"  . MELANOMA EXCISION Left 02/2011   forearm  . NASAL SINUS SURGERY  1970s   "cut windows in sinus pockets"  . PERCUTANEOUS CORONARY STENT INTERVENTION (PCI-S)  05/19/2013   Procedure: PERCUTANEOUS CORONARY STENT INTERVENTION (PCI-S);  Surgeon: [name], MD;  Location: [facility] CATH LAB;  Service: Cardiovascular;;  . ULNAR TUNNEL RELEASE Left 04/07/2016   Dr. Ellene Route    Family History  Problem Relation Age of Onset  . Cancer Mother        intestinal   . Heart disease Mother   . Cancer Father        prostate  . Migraines Daughter   . Leukemia Sister   . Heart disease Sister   . Prostate cancer Other   . Kidney cancer Other   . Cancer Other        Bladder cancer  . Coronary artery disease Other   . Hypertension Sister   . Hypertension Brother   . Heart  attack Neg Hx   . Stroke Neg Hx     Social History   Socioeconomic History  . Marital status: Married    Spouse name: Not on file  . Number of children: Not on file  . Years of education: Not on file  . Highest education level: Not on file  Occupational History  . Occupation: [employer]  . Financial resource strain: Not on file  . Food insecurity    Worry: Not on file    Inability: Not on file  . Transportation needs    Medical: No    Non-medical: No  Tobacco Use  . Smoking status: Former Smoker    Packs/day: 3.00    Years: 30.00    Pack years: 90.00    Types: Cigarettes  Quit date: 09/17/1996    Years since quitting: 22.7  . Smokeless tobacco: Never Used  Substance and Sexual Activity  . Alcohol use: No  . Drug use: No  . Sexual activity: Not on file  Lifestyle  . Physical activity    Days per week: Not on file    Minutes per session: Not on file  . Stress: Not on file  Relationships  . Social Herbalist on phone: Not on file    Gets together: Not on file    Attends religious service: Not on file    Active member of club or organization: Not on file    Attends meetings of clubs or organizations: Not on file    Relationship status: Not on file  . Intimate partner violence    Fear of current or ex partner: Not on file    Emotionally abused: Not on file    Physically abused: Not on file    Forced sexual activity: Not on file  Other Topics Concern  . Not on file  Social History Narrative   Married and lives locally with his wife.  Sharyon Cable.    Outpatient Medications Prior to Visit  Medication Sig Dispense Refill  . acetaminophen (TYLENOL) 500 MG tablet Take 500 mg by mouth 2 (two) times daily.     Marland Kitchen albuterol (PROVENTIL HFA;VENTOLIN HFA) 108 (90 Base) MCG/ACT inhaler Inhale 2 puffs into the lungs every 6 (six) hours as needed for wheezing or shortness of breath. (Patient not taking: Reported on 05/10/2019) 1 Inhaler 0  . allopurinol  (ZYLOPRIM) 300 MG tablet Take 1 tablet (300 mg total) by mouth daily. 90 tablet 1  . aspirin EC 81 MG tablet Take 81 mg by mouth daily.    Marland Kitchen atorvastatin (LIPITOR) 40 MG tablet Take 1 tablet (40 mg total) by mouth daily. 90 tablet 1  . B Complex Vitamins (B COMPLEX-B12 PO) Take 1 tablet by mouth once a week.     . BD INSULIN SYRINGE ULTRAFINE 31G X 5/16" 1 ML MISC USE  TO INJECT  INSULIN  FIVE  TIMES  DAILY AS INSTRUCTED 450 each 3  . Cholecalciferol (VITAMIN D3) 25 MCG (1000 UT) CAPS Take 1,000 Units by mouth at bedtime.    . fenofibrate 160 MG tablet Take 1 tablet (160 mg total) by mouth at bedtime. 90 tablet 1  . fluocinonide cream (LIDEX) [id number] % Apply 1 application topically daily as needed (for rashes).     . fluticasone (FLONASE) 50 MCG/ACT nasal spray Place 2 sprays into both nostrils daily. (Patient not taking: Reported on 05/10/2019) 16 g 1  . furosemide (LASIX) 40 MG tablet TAKE 1 AND 1/2 TABLETS EVERY DAY 135 tablet 3  . glucose blood (TRUE METRIX BLOOD GLUCOSE TEST) test strip USE TO TEST BLOOD SUGAR THREE TIMES DAILY AS DIRECTED 300 each 3  . HYDROcodone-acetaminophen (NORCO) 10-325 MG tablet Take 0.5 tablets by mouth 2 (two) times daily.  0  . insulin NPH Human (NOVOLIN N) 100 UNIT/ML injection Inject under skin 30 units in am and 20 in the evening 30 mL 3  . insulin regular (NOVOLIN R RELION) 100 units/mL injection Inject 0.15-0.2 mLs (15-20 Units total) into the skin 3 (three) times daily before meals. 30 mL 3  . isosorbide mononitrate (IMDUR) 30 MG 24 hr tablet Take 1 tablet (30 mg total) by mouth daily. 90 tablet 3  . MAGNESIUM PO Take 1 tablet by mouth at bedtime.    Marland Kitchen  metoprolol tartrate (LOPRESSOR) 50 MG tablet TAKE 1 TABLET TWICE DAILY (Patient taking differently: Take 25 mg by mouth 2 (two) times daily. ) 180 tablet 1  . metroNIDAZOLE (FLAGYL) 500 MG tablet Take 1 tablet (500 mg total) by mouth 3 (three) times daily. One po bid x 7 days (Patient not taking: Reported on  05/10/2019) 21 tablet 0  . nitroGLYCERIN (NITROSTAT) 0.4 MG SL tablet Place 1 tablet (0.4 mg total) under the tongue every 5 (five) minutes as needed for chest pain. 100 tablet 3  . ondansetron (ZOFRAN ODT) 8 MG disintegrating tablet 8mg  ODT q4 hours prn nausea 6 tablet 0  . pantoprazole (PROTONIX) 20 MG tablet Take 1 tablet (20 mg total) by mouth daily. 90 tablet 1  . Probiotic Product (PROBIOTIC PO) Take 1 capsule by mouth at bedtime.     . tamsulosin (FLOMAX) 0.4 MG CAPS capsule Take 1 capsule (0.4 mg total) by mouth 2 (two) times daily. 180 capsule 1  . tiZANidine (ZANAFLEX) 4 MG tablet Take 4 mg by mouth 2 (two) times daily.  6   No facility-administered medications prior to visit.     Allergies  Allergen Reactions  . Brilinta [Ticagrelor] Shortness Of Breath  . Levemir [Insulin Detemir] Swelling and Other (See Comments)    Patient had redness and swelling and tenderness at injection site.  . Bee Venom   . Codeine Other (See Comments)    Makes him "shakey", is OK with hydrocodone GI UPSET & TREMORS  . Lipitor [Atorvastatin] Other (See Comments)    Muscle aches; affects liver function- Patient is currently taking  . Novolog Mix [Insulin Aspart Prot & Aspart] Other (See Comments)    Causes skin to swell/itch at injection site    Review of Systems  Constitutional: Positive for malaise/fatigue. Negative for fever.  HENT: Negative for congestion.   Eyes: Negative for blurred vision.  Respiratory: Negative for shortness of breath.   Cardiovascular: Negative for chest pain, palpitations and leg swelling.  Gastrointestinal: Negative for abdominal pain, blood in stool and nausea.  Genitourinary: Negative for dysuria and frequency.  Musculoskeletal: Positive for back pain. Negative for falls.  Skin: Negative for rash.  Neurological: Negative for dizziness, loss of consciousness and headaches.  Endo/Heme/Allergies: Negative for environmental allergies.  Psychiatric/Behavioral: Negative  for depression. The patient is nervous/anxious.        Objective:    Physical Exam Vitals signs and nursing note reviewed.  Constitutional:      General: He is not in acute distress.    Appearance: He is well-developed.  HENT:     Head: Normocephalic and atraumatic.     Nose: Nose normal.  Eyes:     General:        Right eye: No discharge.        Left eye: No discharge.  Neck:     Musculoskeletal: Normal range of motion and neck supple.  Cardiovascular:     Rate and Rhythm: Normal rate and regular rhythm.  Pulmonary:     Effort: Pulmonary effort is normal.     Breath sounds: Normal breath sounds.  Abdominal:     General: Bowel sounds are normal.     Palpations: Abdomen is soft.     Tenderness: There is no abdominal tenderness.  Skin:    General: Skin is warm and dry.  Neurological:     Mental Status: He is alert and oriented to person, place, and time.     BP (!) 124/58 (BP Location:  Left Arm, Patient Position: Sitting, Cuff Size: Normal)   Pulse 70   Temp (!) 96.8 F (36 C) (Temporal)   Resp 18   Wt 243 lb 1.6 oz (110.3 kg)   SpO2 98%   BMI 34.39 kg/m  Wt Readings from Last 3 Encounters:  06/15/19 243 lb 1.6 oz (110.3 kg)  05/10/19 244 lb (110.7 kg)  04/27/19 254 lb (115.2 kg)    Diabetic Foot Exam - Simple   No data filed     Lab Results  Component Value Date   WBC 6.2 05/11/2019   HGB 13.3 05/11/2019   HCT 40.3 05/11/2019   PLT 227.0 05/11/2019   GLUCOSE 110 (H) 05/11/2019   CHOL 110 05/11/2019   TRIG 151.0 (H) 05/11/2019   HDL 26.10 (L) 05/11/2019   LDLDIRECT 69.0 01/24/2018   LDLCALC 53 05/11/2019   ALT 17 05/11/2019   AST 20 05/11/2019   NA 141 05/11/2019   K 3.9 05/11/2019   CL 104 05/11/2019   CREATININE 1.37 05/11/2019   BUN 29 (H) 05/11/2019   CO2 29 05/11/2019   TSH 2.81 05/11/2019   PSA 1.01 11/02/2018   INR 1.1 02/24/2017   HGBA1C 6.7 (H) 05/11/2019   MICROALBUR 0.7 08/02/2014    Lab Results  Component Value Date   TSH  2.81 05/11/2019   Lab Results  Component Value Date   WBC 6.2 05/11/2019   HGB 13.3 05/11/2019   HCT 40.3 05/11/2019   MCV 92.1 05/11/2019   PLT 227.0 05/11/2019   Lab Results  Component Value Date   NA 141 05/11/2019   K 3.9 05/11/2019   CO2 29 05/11/2019   GLUCOSE 110 (H) 05/11/2019   BUN 29 (H) 05/11/2019   CREATININE 1.37 05/11/2019   BILITOT 0.7 05/11/2019   ALKPHOS 59 05/11/2019   AST 20 05/11/2019   ALT 17 05/11/2019   PROT 7.0 05/11/2019   ALBUMIN 4.2 05/11/2019   CALCIUM 10.2 05/11/2019   ANIONGAP 12 04/22/2019   GFR 50.10 (L) 05/11/2019   Lab Results  Component Value Date   CHOL 110 05/11/2019   Lab Results  Component Value Date   HDL 26.10 (L) 05/11/2019   Lab Results  Component Value Date   LDLCALC 53 05/11/2019   Lab Results  Component Value Date   TRIG 151.0 (H) 05/11/2019   Lab Results  Component Value Date   CHOLHDL 4 05/11/2019   Lab Results  Component Value Date   HGBA1C 6.7 (H) 05/11/2019       Assessment & Plan:   Problem List Items Addressed This Visit    Depression    Struggling with the stress of the pandemic and his daughters illness but he is doing well all things considered. No changes      Hyperlipidemia    Encouraged heart healthy diet, increase exercise, avoid trans fats, consider a krill oil cap daily. Tolerating Atorvastatin.       Relevant Orders   Lipid panel   Essential hypertension    Well controlled, no changes to meds. Encouraged heart healthy diet such as the DASH diet and exercise as tolerated.       Relevant Orders   CBC   Comprehensive metabolic panel   TSH   Uncontrolled type 2 diabetes mellitus with circulatory disorder, with long-term current use of insulin (HCC)    hgba1c acceptable, minimize simple carbs. Increase exercise as tolerated.      Relevant Orders   Hemoglobin A1c   Chronic renal  failure, stage 3 (moderate) (HCC)    Hydrate and monitor      Chronic diastolic CHF (congestive  heart failure) (HCC)    No recent exacerbation.       Other Visit Diagnoses    Needs flu shot    -  Primary   Relevant Orders   Flu Vaccine QUAD High Dose(Fluad) (Completed)      I am having William Hendrix. William Childes "William Hendrix" maintain his fluocinonide cream, aspirin EC, acetaminophen, B Complex Vitamins (B COMPLEX-B12 PO), albuterol, BD Insulin Syringe Ultrafine, fluticasone, Probiotic Product (PROBIOTIC PO), nitroGLYCERIN, glucose blood, tiZANidine, Vitamin D3, MAGNESIUM PO, HYDROcodone-acetaminophen, isosorbide mononitrate, furosemide, allopurinol, atorvastatin, fenofibrate, tamsulosin, metroNIDAZOLE, ondansetron, insulin NPH Human, insulin regular, pantoprazole, and metoprolol tartrate.  No orders of the defined types were placed in this encounter.    Penni Homans, MD

## 2019-07-02 DIAGNOSIS — G4733 Obstructive sleep apnea (adult) (pediatric): Secondary | ICD-10-CM | POA: Diagnosis not present

## 2019-07-06 ENCOUNTER — Other Ambulatory Visit: Payer: Self-pay

## 2019-07-06 NOTE — Patient Outreach (Addendum)
[facility]) Care Management  07/06/2019  William Hendrix 23-Dec-1940 [id number]    1st outreach to the patient for initial assessment. HIPAA verified.  Spoke with the patient and identified myself and gave information about the program.  I let him know that the program is voluntary. The patient  felt he does not need any education at this time. He states that his blood sugar this morning was 101.  Dr Cruzita Lederer is his endocrinologist and keeps his blood sugars down.  His last A1c in July was 6.7.  He gets all of his medication for free and he is able to travel to his appointments.  Informed the patient I appreciated him talking with me and I would like to send him a pamphlet of our services for future reference.  He stated that would be fine.  Plan:  RN Health Coach will close the program at this time.  Will send the patient a successful letter with pamphlet  William Arms RN, BSN, [place] Direct Dial:  [phone]  Fax: [phone]

## 2019-07-19 ENCOUNTER — Other Ambulatory Visit: Payer: Self-pay | Admitting: Cardiology

## 2019-07-19 ENCOUNTER — Other Ambulatory Visit: Payer: Self-pay | Admitting: Family Medicine

## 2019-07-20 NOTE — Telephone Encounter (Signed)
Please advise if ok to refill Furosemide 40 mg 1 and 1/2 tablet qd. Last filled by Theodoro Parma.

## 2019-07-26 ENCOUNTER — Other Ambulatory Visit: Payer: Self-pay | Admitting: Internal Medicine

## 2019-08-01 ENCOUNTER — Telehealth: Payer: Self-pay

## 2019-08-01 DIAGNOSIS — G4733 Obstructive sleep apnea (adult) (pediatric): Secondary | ICD-10-CM | POA: Diagnosis not present

## 2019-08-01 NOTE — Telephone Encounter (Signed)
Spoke to patient's wife and relayed the below info:  Per Dr. Cruzita Lederer last chart note in July instructions:  Patient Instructions  Please decrease: Insulin Before breakfast Before lunch Before dinner  Regular 15-20 15-20 15-20 (if sugars <100, take 10 units)  NPH 30  20   If you take dinnertime insulin after dinner, take 50% of the dose.  Please return in 4 months with your sugar log.

## 2019-08-01 NOTE — Telephone Encounter (Signed)
Patients wife called in stating that the [facility] is making you pay for two prescriptions becasuse he take long acting and short and she is confused because she thought it was only one prescription.  insulin NPH Human (NOVOLIN N) 100 UNIT/ML injection  She wants to know if there going to make her change it or not   Please call wife for better accuracy and advise

## 2019-08-08 DIAGNOSIS — G4733 Obstructive sleep apnea (adult) (pediatric): Secondary | ICD-10-CM | POA: Diagnosis not present

## 2019-08-14 ENCOUNTER — Other Ambulatory Visit: Payer: Self-pay

## 2019-08-14 ENCOUNTER — Other Ambulatory Visit (INDEPENDENT_AMBULATORY_CARE_PROVIDER_SITE_OTHER): Payer: Medicare HMO

## 2019-08-14 DIAGNOSIS — E785 Hyperlipidemia, unspecified: Secondary | ICD-10-CM

## 2019-08-14 DIAGNOSIS — Z794 Long term (current) use of insulin: Secondary | ICD-10-CM

## 2019-08-14 DIAGNOSIS — I10 Essential (primary) hypertension: Secondary | ICD-10-CM

## 2019-08-14 DIAGNOSIS — IMO0002 Reserved for concepts with insufficient information to code with codable children: Secondary | ICD-10-CM

## 2019-08-14 DIAGNOSIS — E1159 Type 2 diabetes mellitus with other circulatory complications: Secondary | ICD-10-CM

## 2019-08-14 DIAGNOSIS — E1165 Type 2 diabetes mellitus with hyperglycemia: Secondary | ICD-10-CM | POA: Diagnosis not present

## 2019-08-14 LAB — COMPREHENSIVE METABOLIC PANEL
ALT: 18 U/L (ref 0–53)
AST: 19 U/L (ref 0–37)
Albumin: 4.2 g/dL (ref 3.5–5.2)
Alkaline Phosphatase: 58 U/L (ref 39–117)
BUN: 30 mg/dL — ABNORMAL HIGH (ref 6–23)
CO2: 29 mEq/L (ref 19–32)
Calcium: 9.9 mg/dL (ref 8.4–10.5)
Chloride: 104 mEq/L (ref 96–112)
Creatinine, Ser: 1.4 mg/dL (ref 0.40–1.50)
GFR: 48.83 mL/min — ABNORMAL LOW (ref >60.00)
Glucose, Bld: 189 mg/dL — ABNORMAL HIGH (ref 70–99)
Potassium: 3.8 mEq/L (ref 3.5–5.1)
Sodium: 140 mEq/L (ref 135–145)
Total Bilirubin: 0.4 mg/dL (ref 0.2–1.2)
Total Protein: 6.7 g/dL (ref 6.0–8.3)

## 2019-08-14 LAB — LIPID PANEL
Cholesterol: 123 mg/dL (ref 0–200)
HDL: 23.9 mg/dL — ABNORMAL LOW (ref >39.00)
NonHDL: 99.51
Total CHOL/HDL Ratio: 5
Triglycerides: 217 mg/dL — ABNORMAL HIGH (ref 0.0–149.0)
VLDL: 43.4 mg/dL — ABNORMAL HIGH (ref 0.0–40.0)

## 2019-08-14 LAB — TSH: TSH: 3.83 u[IU]/mL (ref 0.35–4.50)

## 2019-08-14 LAB — CBC
HCT: 40.2 % (ref 39.0–52.0)
Hemoglobin: 13.4 g/dL (ref 13.0–17.0)
MCHC: 33.3 g/dL (ref 30.0–36.0)
MCV: 92 fl (ref 78.0–100.0)
Platelets: 194 10*3/uL (ref 150.0–400.0)
RBC: 4.36 Mil/uL (ref 4.22–5.81)
RDW: 14.2 % (ref 11.5–15.5)
WBC: 6.2 10*3/uL (ref 4.0–10.5)

## 2019-08-14 LAB — LDL CHOLESTEROL, DIRECT: Direct LDL: 64 mg/dL

## 2019-08-14 LAB — HEMOGLOBIN A1C: Hgb A1c MFr Bld: 7.2 % — ABNORMAL HIGH (ref 4.6–6.5)

## 2019-08-18 ENCOUNTER — Other Ambulatory Visit: Payer: Self-pay

## 2019-08-18 ENCOUNTER — Encounter: Payer: Self-pay | Admitting: Family Medicine

## 2019-08-18 ENCOUNTER — Ambulatory Visit (INDEPENDENT_AMBULATORY_CARE_PROVIDER_SITE_OTHER): Payer: Medicare HMO | Admitting: Family Medicine

## 2019-08-18 VITALS — BP 129/61 | HR 63 | Wt 238.5 lb

## 2019-08-18 DIAGNOSIS — E1159 Type 2 diabetes mellitus with other circulatory complications: Secondary | ICD-10-CM | POA: Diagnosis not present

## 2019-08-18 DIAGNOSIS — N289 Disorder of kidney and ureter, unspecified: Secondary | ICD-10-CM

## 2019-08-18 DIAGNOSIS — Z794 Long term (current) use of insulin: Secondary | ICD-10-CM | POA: Diagnosis not present

## 2019-08-18 DIAGNOSIS — I10 Essential (primary) hypertension: Secondary | ICD-10-CM | POA: Diagnosis not present

## 2019-08-18 DIAGNOSIS — E785 Hyperlipidemia, unspecified: Secondary | ICD-10-CM

## 2019-08-18 DIAGNOSIS — E1165 Type 2 diabetes mellitus with hyperglycemia: Secondary | ICD-10-CM

## 2019-08-18 DIAGNOSIS — IMO0002 Reserved for concepts with insufficient information to code with codable children: Secondary | ICD-10-CM

## 2019-08-18 DIAGNOSIS — I5032 Chronic diastolic (congestive) heart failure: Secondary | ICD-10-CM | POA: Diagnosis not present

## 2019-08-18 DIAGNOSIS — M48062 Spinal stenosis, lumbar region with neurogenic claudication: Secondary | ICD-10-CM

## 2019-08-18 NOTE — Assessment & Plan Note (Addendum)
Mild, stable. Hydrate and monitor, following with [facility]

## 2019-08-21 ENCOUNTER — Ambulatory Visit: Payer: Medicare HMO | Admitting: Family Medicine

## 2019-08-21 NOTE — Assessment & Plan Note (Signed)
Continues to struggle with daily pain but manages to perform his ADLs and accepts that this is the level he will have to tolerate

## 2019-08-21 NOTE — Assessment & Plan Note (Signed)
No recent exacerbation. No change in therapy

## 2019-08-21 NOTE — Assessment & Plan Note (Signed)
Tolerating statin, encouraged heart healthy diet, avoid trans fats, minimize simple carbs and saturated fats. Increase exercise as tolerated 

## 2019-08-21 NOTE — Assessment & Plan Note (Signed)
hgba1c acceptable, minimize simple carbs. Increase exercise as tolerated. Continue current meds 

## 2019-08-21 NOTE — Progress Notes (Signed)
Virtual Visit viaphone Note  I connected with [name] on 08/18/19 at  9:20 AM EST by a phone enabled telemedicine application and verified that I am speaking with the correct person using two identifiers.  Location: Patient: home Provider: home   I discussed the limitations of evaluation and management by telemedicine and the availability of in person appointments. The patient expressed understanding and agreed to proceed. [name], CMA was able to get the patient setup on a phone visit after being unable to set up a video visit.    Subjective:    Patient ID: William Hendrix, male    DOB: 1941-03-03, 78 y.o.   MRN: [id number]  No chief complaint on file.   HPI Patient is in today for follow up on chronic medial concerns including hypertension, CHF, diabetes and more. No recent febrile illness or hospitalizations. No polyuria or polydipsia. He acknowledges he has not always followed a diabetic diet. His back pain is persistent but manageable. Denies CP/palp/SOB/HA/congestion/fevers/GI or GU c/o. Taking meds as prescribed  Past Medical History:  Diagnosis Date  . AAA (abdominal aortic aneurysm) ([place]) 12/15/2014   a. Mild aneurysmal dilatation of the infrarenal abdominal aorta 3.2 cm - f/u due by 2019.  . Arthritis    "all over" (07/30/2016)  . CAD (coronary artery disease)    a. stent to LAD 2000. b. possible spasm by cath 2001. c. IVUS/PTCA/DES to mLAD 05/2013. d. PTCA/DES of dRCA into ostial rPDA 07/2013. e. PTCA of OM2, DES to Cx, DES to [facility] ([place] 07/2016.  Marland Kitchen Chronic bronchitis ([place])   . Chronic diastolic CHF (congestive heart failure) ([place])   . Chronic lower back pain   . Chronic neck pain   . CKD (chronic kidney disease), stage III   . Diabetic peripheral neuropathy ([place])   . Ejection fraction    55%, 07/2010, mild inferior hypo  . GERD (gastroesophageal reflux disease)   . Gout   . H/O diverticulitis of colon 01/31/2015  . H/O hiatal hernia   . History of blood  transfusion ~ 10/1940   "had pneumonia"  . History of kidney stones   . HTN (hypertension)   . Hyperlipidemia   . Melanoma of forearm, left ([place]) 02/2011   with wide excision   . Myocardial infarction ([place])   . Nephrolithiasis   . Obesity   . OSA on CPAP     [name] since 2000  . Peripheral neuropathy    both feet  . Positive D-dimer    a. significant elevation ,hospital 07/2010, etiology unclear. b. D Dimer chronically > 20.  Marland Kitchen Renal insufficiency   . Skin cancer   . Spinal stenosis    a. s/p surgical repair 2013  . Spinal stenosis of lumbar region   . Type II diabetes mellitus ([place])   . Ventral hernia     Past Surgical History:  Procedure Laterality Date  . ANTERIOR LAT LUMBAR FUSION  10/22/2017   Procedure: Lumbar two-three Lumbar three-four Lumbar four-five Anteriolateral lumbar interbody fusion with percutaneous pedicle screw fixation and infuse;  Surgeon: [name], MD;  Location: [place];  Service: Neurosurgery;;  . APPLICATION OF ROBOTIC ASSISTANCE FOR SPINAL PROCEDURE  10/22/2017   Procedure: APPLICATION OF ROBOTIC ASSISTANCE FOR SPINAL PROCEDURE;  Surgeon: [name], MD;  Location: [place];  Service: Neurosurgery;;  . BACK SURGERY    . CARDIAC CATHETERIZATION N/A 07/30/2016   Procedure: Left Heart Cath and Coronary Angiography;  Surgeon: Nelva Bush, MD;  Location: [facility]  INVASIVE CV LAB;  Service: Cardiovascular;  Laterality: N/A;  . CARDIAC CATHETERIZATION N/A 07/30/2016   Procedure: Coronary Stent Intervention;  Surgeon: Nelva Bush, MD;  Location: [place] CV LAB;  Service: Cardiovascular;  Laterality: N/A;  Mid CFX and MID RCA  . CARDIAC CATHETERIZATION N/A 07/30/2016   Procedure: Coronary Balloon Angioplasty;  Surgeon: Nelva Bush, MD;  Location: [place] CV LAB;  Service: Cardiovascular;  Laterality: N/A;  OM 1  . CARPAL TUNNEL RELEASE Left   . CATARACT EXTRACTION W/ INTRAOCULAR LENS  IMPLANT, BILATERAL Bilateral ~ 2014  . CERVICAL DISC SURGERY   1990s  . CORONARY ANGIOPLASTY WITH STENT PLACEMENT  05/2013  . CORONARY ANGIOPLASTY WITH STENT PLACEMENT  07/26/2013   DES to RCA extending to PDA    . CORONARY ANGIOPLASTY WITH STENT PLACEMENT  07/30/2016  . CORONARY ANGIOPLASTY WITH STENT PLACEMENT  2000   CAD  . DENTAL SURGERY  04/2016   "got infected; had to dig it out"  . EYE SURGERY    . KNEE ARTHROSCOPY Right 1990's   rt  . LEFT AND RIGHT HEART CATHETERIZATION WITH CORONARY ANGIOGRAM N/A 07/26/2013   Procedure: LEFT AND RIGHT HEART CATHETERIZATION WITH CORONARY ANGIOGRAM;  Surgeon: [name], MD;  Location: [place] CATH LAB;  Service: Cardiovascular;  Laterality: N/A;  . LEFT HEART CATH AND CORONARY ANGIOGRAPHY N/A 02/26/2017   Procedure: Left Heart Cath and Coronary Angiography;  Surgeon: Sherren Mocha, MD;  Location: [place] CV LAB;  Service: Cardiovascular;  Laterality: N/A;  . LEFT HEART CATHETERIZATION WITH CORONARY ANGIOGRAM N/A 05/19/2013   Procedure: LEFT HEART CATHETERIZATION WITH CORONARY ANGIOGRAM;  Surgeon: [name], MD;  Location: [facility] CATH LAB;  Service: Cardiovascular;  Laterality: N/A;  . LUMBAR LAMINECTOMY/DECOMPRESSION MICRODISCECTOMY  08/30/2012   Procedure: LUMBAR LAMINECTOMY/DECOMPRESSION MICRODISCECTOMY 2 LEVELS;  Surgeon: [name], MD;  Location: [place] NEURO ORS;  Service: Neurosurgery;  Laterality: Bilateral;  Bilateral Lumbar three-four Lumbar four-five Laminotomies  . LUMBAR PERCUTANEOUS PEDICLE SCREW 1 LEVEL Bilateral 10/22/2017   Procedure: LUMBAR PERCUTANEOUS PEDICLE SCREW 1 LEVEL;  Surgeon: [name], MD;  Location: [place];  Service: Neurosurgery;  Laterality: Bilateral;  . LUMBAR SPINE SURGERY  04/07/2016   Dr. Ellene Route; "?ruptured disc"  . MELANOMA EXCISION Left 02/2011   forearm  . NASAL SINUS SURGERY  1970s   "cut windows in sinus pockets"  . PERCUTANEOUS CORONARY STENT INTERVENTION (PCI-S)  05/19/2013   Procedure: PERCUTANEOUS CORONARY STENT INTERVENTION (PCI-S);  Surgeon: [name], MD;  Location: [facility] CATH LAB;  Service: Cardiovascular;;  . ULNAR TUNNEL RELEASE Left 04/07/2016   Dr. Ellene Route    Family History  Problem Relation Age of Onset  . Cancer Mother        intestinal   . Heart disease Mother   . Cancer Father        prostate  . Migraines Daughter   . Leukemia Sister   . Heart disease Sister   . Prostate cancer Other   . Kidney cancer Other   . Cancer Other        Bladder cancer  . Coronary artery disease Other   . Hypertension Sister   . Hypertension Brother   . Heart attack Neg Hx   . Stroke Neg Hx     Social History   Socioeconomic History  . Marital status: Married    Spouse name: Not on file  . Number of children: Not on file  . Years of education: Not on file  . Highest  education level: Not on file  Occupational History  . Occupation: [employer]  . Financial resource strain: Not on file  . Food insecurity    Worry: Not on file    Inability: Not on file  . Transportation needs    Medical: No    Non-medical: No  Tobacco Use  . Smoking status: Former Smoker    Packs/day: 3.00    Years: 30.00    Pack years: 90.00    Types: Cigarettes    Quit date: 09/17/1996    Years since quitting: 22.9  . Smokeless tobacco: Never Used  Substance and Sexual Activity  . Alcohol use: No  . Drug use: No  . Sexual activity: Not on file  Lifestyle  . Physical activity    Days per week: Not on file    Minutes per session: Not on file  . Stress: Not on file  Relationships  . Social Herbalist on phone: Not on file    Gets together: Not on file    Attends religious service: Not on file    Active member of club or organization: Not on file    Attends meetings of clubs or organizations: Not on file    Relationship status: Not on file  . Intimate partner violence    Fear of current or ex partner: Not on file    Emotionally abused: Not on file    Physically abused: Not on file    Forced sexual activity: Not on file   Other Topics Concern  . Not on file  Social History Narrative   Married and lives locally with his wife.  Sharyon Cable.    Outpatient Medications Prior to Visit  Medication Sig Dispense Refill  . acetaminophen (TYLENOL) 500 MG tablet Take 500 mg by mouth 2 (two) times daily.     Marland Kitchen allopurinol (ZYLOPRIM) 300 MG tablet TAKE 1 TABLET EVERY DAY 90 tablet 1  . aspirin EC 81 MG tablet Take 81 mg by mouth daily.    Marland Kitchen atorvastatin (LIPITOR) 40 MG tablet TAKE 1 TABLET EVERY DAY 90 tablet 1  . B Complex Vitamins (B COMPLEX-B12 PO) Take 1 tablet by mouth once a week.     . BD INSULIN SYRINGE ULTRAFINE 31G X 5/16" 1 ML MISC USE  TO INJECT  INSULIN  FIVE  TIMES  DAILY AS INSTRUCTED 450 each 3  . Cholecalciferol (VITAMIN D3) 25 MCG (1000 UT) CAPS Take 1,000 Units by mouth at bedtime.    . fenofibrate 160 MG tablet TAKE 1 TABLET (160 MG TOTAL) BY MOUTH AT BEDTIME. 90 tablet 1  . fluocinonide cream (LIDEX) [id number] % Apply 1 application topically daily as needed (for rashes).     . furosemide (LASIX) 40 MG tablet TAKE 1 AND 1/2 TABLETS EVERY DAY 135 tablet 3  . HYDROcodone-acetaminophen (NORCO) 10-325 MG tablet Take 0.5 tablets by mouth 2 (two) times daily.  0  . insulin NPH Human (NOVOLIN N) 100 UNIT/ML injection Inject under skin 30 units in am and 20 in the evening 30 mL 3  . insulin regular (NOVOLIN R RELION) 100 units/mL injection Inject 0.15-0.2 mLs (15-20 Units total) into the skin 3 (three) times daily before meals. 30 mL 3  . isosorbide mononitrate (IMDUR) 30 MG 24 hr tablet Take 1 tablet (30 mg total) by mouth daily. 90 tablet 3  . MAGNESIUM PO Take 1 tablet by mouth at bedtime.    . metoprolol tartrate (LOPRESSOR) 50 MG tablet  TAKE 1 TABLET TWICE DAILY (Patient taking differently: Take 25 mg by mouth 2 (two) times daily. ) 180 tablet 1  . nitroGLYCERIN (NITROSTAT) 0.4 MG SL tablet Place 1 tablet (0.4 mg total) under the tongue every 5 (five) minutes as needed for chest pain. 100 tablet 3  . ondansetron  (ZOFRAN ODT) 8 MG disintegrating tablet 8mg  ODT q4 hours prn nausea 6 tablet 0  . pantoprazole (PROTONIX) 20 MG tablet Take 1 tablet (20 mg total) by mouth daily. 90 tablet 1  . Probiotic Product (PROBIOTIC PO) Take 1 capsule by mouth at bedtime.     . tamsulosin (FLOMAX) 0.4 MG CAPS capsule Take 1 capsule (0.4 mg total) by mouth 2 (two) times daily. 180 capsule 1  . tiZANidine (ZANAFLEX) 4 MG tablet Take 4 mg by mouth 2 (two) times daily.  6  . TRUE METRIX BLOOD GLUCOSE TEST test strip TEST BLOOD SUGAR THREE TIMES DAILY AS DIRECTED 300 strip 11  . albuterol (PROVENTIL HFA;VENTOLIN HFA) 108 (90 Base) MCG/ACT inhaler Inhale 2 puffs into the lungs every 6 (six) hours as needed for wheezing or shortness of breath. (Patient not taking: Reported on 05/10/2019) 1 Inhaler 0  . fluticasone (FLONASE) 50 MCG/ACT nasal spray Place 2 sprays into both nostrils daily. (Patient not taking: Reported on 05/10/2019) 16 g 1  . metroNIDAZOLE (FLAGYL) 500 MG tablet Take 1 tablet (500 mg total) by mouth 3 (three) times daily. One po bid x 7 days (Patient not taking: Reported on 05/10/2019) 21 tablet 0   No facility-administered medications prior to visit.     Allergies  Allergen Reactions  . Brilinta [Ticagrelor] Shortness Of Breath  . Levemir [Insulin Detemir] Swelling and Other (See Comments)    Patient had redness and swelling and tenderness at injection site.  . Bee Venom   . Codeine Other (See Comments)    Makes him "shakey", is OK with hydrocodone GI UPSET & TREMORS  . Lipitor [Atorvastatin] Other (See Comments)    Muscle aches; affects liver function- Patient is currently taking  . Novolog Mix [Insulin Aspart Prot & Aspart] Other (See Comments)    Causes skin to swell/itch at injection site    Review of Systems  Constitutional: Positive for malaise/fatigue. Negative for fever.  HENT: Negative for congestion.   Eyes: Negative for blurred vision.  Respiratory: Negative for shortness of breath.    Cardiovascular: Negative for chest pain, palpitations and leg swelling.  Gastrointestinal: Negative for abdominal pain, blood in stool and nausea.  Genitourinary: Negative for dysuria and frequency.  Musculoskeletal: Positive for back pain. Negative for falls.  Skin: Negative for rash.  Neurological: Negative for dizziness, loss of consciousness and headaches.  Endo/Heme/Allergies: Negative for environmental allergies.  Psychiatric/Behavioral: Negative for depression. The patient is not nervous/anxious.        Objective:    Physical Exam unable to obtain via phone visit  BP 129/61   Pulse 63   Wt 238 lb 8 oz (108.2 kg)   BMI 33.74 kg/m  Wt Readings from Last 3 Encounters:  08/18/19 238 lb 8 oz (108.2 kg)  06/15/19 243 lb 1.6 oz (110.3 kg)  05/10/19 244 lb (110.7 kg)    Diabetic Foot Exam - Simple   No data filed     Lab Results  Component Value Date   WBC 6.2 08/14/2019   HGB 13.4 08/14/2019   HCT 40.2 08/14/2019   PLT 194.0 08/14/2019   GLUCOSE 189 (H) 08/14/2019   CHOL 123 08/14/2019  TRIG 217.0 (H) 08/14/2019   HDL 23.90 (L) 08/14/2019   LDLDIRECT 64.0 08/14/2019   LDLCALC 53 05/11/2019   ALT 18 08/14/2019   AST 19 08/14/2019   NA 140 08/14/2019   K 3.8 08/14/2019   CL 104 08/14/2019   CREATININE 1.40 08/14/2019   BUN 30 (H) 08/14/2019   CO2 29 08/14/2019   TSH 3.83 08/14/2019   PSA 1.01 11/02/2018   INR 1.1 02/24/2017   HGBA1C 7.2 (H) 08/14/2019   MICROALBUR 0.7 08/02/2014    Lab Results  Component Value Date   TSH 3.83 08/14/2019   Lab Results  Component Value Date   WBC 6.2 08/14/2019   HGB 13.4 08/14/2019   HCT 40.2 08/14/2019   MCV 92.0 08/14/2019   PLT 194.0 08/14/2019   Lab Results  Component Value Date   NA 140 08/14/2019   K 3.8 08/14/2019   CO2 29 08/14/2019   GLUCOSE 189 (H) 08/14/2019   BUN 30 (H) 08/14/2019   CREATININE 1.40 08/14/2019   BILITOT 0.4 08/14/2019   ALKPHOS 58 08/14/2019   AST 19 08/14/2019   ALT 18  08/14/2019   PROT 6.7 08/14/2019   ALBUMIN 4.2 08/14/2019   CALCIUM 9.9 08/14/2019   ANIONGAP 12 04/22/2019   GFR 48.83 (L) 08/14/2019   Lab Results  Component Value Date   CHOL 123 08/14/2019   Lab Results  Component Value Date   HDL 23.90 (L) 08/14/2019   Lab Results  Component Value Date   LDLCALC 53 05/11/2019   Lab Results  Component Value Date   TRIG 217.0 (H) 08/14/2019   Lab Results  Component Value Date   CHOLHDL 5 08/14/2019   Lab Results  Component Value Date   HGBA1C 7.2 (H) 08/14/2019       Assessment & Plan:   Problem List Items Addressed This Visit    Renal insufficiency    Mild, stable. Hydrate and monitor, following with [facility]      Hyperlipidemia    Tolerating statin, encouraged heart healthy diet, avoid trans fats, minimize simple carbs and saturated fats. Increase exercise as tolerated      Relevant Orders   Lipid panel   Essential hypertension - Primary    Well controlled, no changes to meds. Encouraged heart healthy diet such as the DASH diet and exercise as tolerated.       Relevant Orders   CBC   CMP   TSH   Controlled diabetes mellitus type 2 with complications (HCC)    [id number] acceptable, minimize simple carbs. Increase exercise as tolerated. Continue current meds      Lumbar stenosis with neurogenic claudication    Continues to struggle with daily pain but manages to perform his ADLs and accepts that this is the level he will have to tolerate      Chronic diastolic CHF (congestive heart failure) (HCC)    No recent exacerbation. No change in therapy         I am having Elyn Aquas. Lorain Childes "[name]" maintain his fluocinonide cream, aspirin EC, acetaminophen, B Complex Vitamins (B COMPLEX-B12 PO), albuterol, BD Insulin Syringe Ultrafine, fluticasone, Probiotic Product (PROBIOTIC PO), nitroGLYCERIN, tiZANidine, Vitamin D3, MAGNESIUM PO, HYDROcodone-acetaminophen, isosorbide mononitrate, tamsulosin, metroNIDAZOLE,  ondansetron, insulin NPH Human, insulin regular, pantoprazole, metoprolol tartrate, fenofibrate, allopurinol, atorvastatin, furosemide, and True Metrix Blood Glucose Test.  No orders of the defined types were placed in this encounter.    I discussed the assessment and treatment plan with the patient. The patient  was provided an opportunity to ask questions and all were answered. The patient agreed with the plan and demonstrated an understanding of the instructions.   The patient was advised to call back or seek an in-person evaluation if the symptoms worsen or if the condition fails to improve as anticipated.  I provided 25 minutes of non-face-to-face time during this encounter.   Penni Homans, MD

## 2019-08-21 NOTE — Assessment & Plan Note (Signed)
Well controlled, no changes to meds. Encouraged heart healthy diet such as the DASH diet and exercise as tolerated.  °

## 2019-08-22 ENCOUNTER — Telehealth: Payer: Self-pay | Admitting: Family Medicine

## 2019-08-22 NOTE — Telephone Encounter (Signed)
Patient declined AWV at this time. SF °

## 2019-08-25 ENCOUNTER — Other Ambulatory Visit: Payer: Self-pay

## 2019-08-29 ENCOUNTER — Ambulatory Visit (INDEPENDENT_AMBULATORY_CARE_PROVIDER_SITE_OTHER): Payer: Medicare HMO | Admitting: Internal Medicine

## 2019-08-29 ENCOUNTER — Encounter: Payer: Self-pay | Admitting: Internal Medicine

## 2019-08-29 VITALS — BP 120/60 | HR 72 | Ht 70.5 in | Wt 248.0 lb

## 2019-08-29 DIAGNOSIS — E785 Hyperlipidemia, unspecified: Secondary | ICD-10-CM | POA: Diagnosis not present

## 2019-08-29 DIAGNOSIS — Z794 Long term (current) use of insulin: Secondary | ICD-10-CM | POA: Diagnosis not present

## 2019-08-29 DIAGNOSIS — Z6837 Body mass index (BMI) 37.0-37.9, adult: Secondary | ICD-10-CM

## 2019-08-29 DIAGNOSIS — N183 Chronic kidney disease, stage 3 unspecified: Secondary | ICD-10-CM | POA: Diagnosis not present

## 2019-08-29 DIAGNOSIS — E1159 Type 2 diabetes mellitus with other circulatory complications: Secondary | ICD-10-CM

## 2019-08-29 MED ORDER — INSULIN NPH (HUMAN) (ISOPHANE) 100 UNIT/ML ~~LOC~~ SUSP: SUBCUTANEOUS | 3 refills | Status: DC

## 2019-08-29 NOTE — Progress Notes (Signed)
Patient ID: William Hendrix, male   DOB: [birth year], 78 y.o.   MRN: [id number]  HPI: William Hendrix is a 78 y.o.-year-old male, returning for f/u for DM2, dx 2009, insulin-dependent, uncontrolled, with complications (CAD s/p AMI 2017, CKD stage 3). Last visit 4 months ago.  At the end of 2018, he eliminated meat almost completely and his sugars improved significantly while he also lost weight.  He continues to eat a mostly plant-based diet.  However, he recently gained some weight and his sugars are also higher.  He did not change his diet.  Reviewed HbA1c levels: Lab Results  Component Value Date   HGBA1C 7.2 (H) 08/14/2019   HGBA1C 6.7 (H) 05/11/2019   HGBA1C 6.3 (A) 04/27/2019   He is on: ReliOn insulin: Insulin Before breakfast Before lunch Before dinner  Regular 35 >> 15-20 30-35 >> 15-20 35 >> 15-20 (if sugars <100, take 10 units)  NPH 45 >> 30  50 >> 20  If you take dinnertime insulin after dinner, take 50% of the dose. Please return in 4 months with your sugar log. We cannot use Metformin 2/2 CKD. We stopped Januvia >> could not afford ($100/3 mo)  We cannot use Levemir >> developed a severe skin reaction We stopped Amaryl 4 mg.  He checks sugars 3 times a day: - am: 72-144, 159 >> 70-150 >> 88, 98-180, 191, 212 - Before lunch: 51, 104-136, 167 >> 100-130 >> 119-176, 193 - before dinner:70-135 >> 61, 71-169 >> 100-140 >> 95,103-210 - Before bedtime: 57, 120-145 >> 180-201 >> n/c Lowest sugar was   51 >> 70 >> 88; it is unclear at which level he has hypoglycemia awareness. Highest sugar was  293 (steroids) >> 201 >> 201 (ate cantaloupe) >> 212.  Meals: - Breakfast: oatmeal + toast + tomatoes - Lunch: beans  - Dinner:home cooked veggies   + CKD, last BUN/creatinine was:  Lab Results  Component Value Date   BUN 30 (H) 08/14/2019   CREATININE 1.40 08/14/2019   ACR levels were normal: Lab Results  Component Value Date   MICRALBCREAT 5.1 08/02/2014   MICRALBCREAT  0.7 02/23/2013   Review GFR levels: Lab Results  Component Value Date   GFRAA 48 (L) 04/22/2019   GFRAA 44 (L) 12/29/2018   GFRAA 39 (L) 11/12/2018   GFRAA 32 (L) 11/11/2018   GFRAA 44 (L) 09/16/2018   GFRAA 43 (L) 09/15/2018   GFRAA 46 (L) 08/21/2018   GFRAA 45 (L) 07/25/2018   GFRAA 39 (L) 04/23/2018   GFRAA 58 (L) 10/23/2017   + HL; latest lipids: Lab Results  Component Value Date   CHOL 123 08/14/2019   HDL 23.90 (L) 08/14/2019   LDLCALC 53 05/11/2019   LDLDIRECT 64.0 08/14/2019   TRIG 217.0 (H) 08/14/2019   CHOLHDL 5 08/14/2019  He is on Lipitor, fenofibrate, flaxseed oil. Pt's last eye exam was 09/2015: No DR. Had cataract sx in 2011. He denies numbness and tingling in his legs.  He had back surgery in 03/2016 and 10/2017.  He developed a kidney stone after his last surgery. He was admitted 2x in 2017 for: CP/SOB >> AMI.  He had 2 stents placed then. He had a cardiac cath 02/2017 >> no new blockages He was seen emergency room with colitis 04/22/2019.    PMH: Pt also also has a history of CAD, HTN, CKD stage III, history of nephrolithiasis, obesity, hiatal hernia, OSA, melanoma, spinal stenosis.  ROS: Constitutional: no weight gain/weight loss, no  fatigue, no subjective hyperthermia, no subjective hypothermia Eyes: no blurry vision, no xerophthalmia ENT: no sore throat, no nodules palpated in neck, no dysphagia, no odynophagia, no hoarseness Cardiovascular: no CP/no SOB/no palpitations/no leg swelling Respiratory: no cough/no SOB/no wheezing Gastrointestinal: no N/no V/no D/no C/no acid reflux Musculoskeletal: no muscle aches/no joint aches Skin: no rashes, no hair loss Neurological: no tremors/no numbness/no tingling/no dizziness  I reviewed pt's medications, allergies, PMH, social hx, family hx, and changes were documented in the history of present illness. Otherwise, unchanged from my initial visit note.  Past Medical History:  Diagnosis Date  . AAA  (abdominal aortic aneurysm) ([place]) 12/15/2014   a. Mild aneurysmal dilatation of the infrarenal abdominal aorta 3.2 cm - f/u due by 2019.  . Arthritis    "all over" (07/30/2016)  . CAD (coronary artery disease)    a. stent to LAD 2000. b. possible spasm by cath 2001. c. IVUS/PTCA/DES to mLAD 05/2013. d. PTCA/DES of dRCA into ostial rPDA 07/2013. e. PTCA of OM2, DES to Cx, DES to [facility] 07/2016.  Marland Kitchen Chronic bronchitis ([place])   . Chronic diastolic CHF (congestive heart failure) ([place])   . Chronic lower back pain   . Chronic neck pain   . CKD (chronic kidney disease), stage III   . Diabetic peripheral neuropathy ([place])   . Ejection fraction    55%, 07/2010, mild inferior hypo  . GERD (gastroesophageal reflux disease)   . Gout   . H/O diverticulitis of colon 01/31/2015  . H/O hiatal hernia   . History of blood transfusion ~ 10/1940   "had pneumonia"  . History of kidney stones   . HTN (hypertension)   . Hyperlipidemia   . Melanoma of forearm, left ([place]) 02/2011   with wide excision   . Myocardial infarction ([place])   . Nephrolithiasis   . Obesity   . OSA on CPAP     [name] since 2000  . Peripheral neuropathy    both feet  . Positive D-dimer    a. significant elevation ,hospital 07/2010, etiology unclear. b. D Dimer chronically > 20.  Marland Kitchen Renal insufficiency   . Skin cancer   . Spinal stenosis    a. s/p surgical repair 2013  . Spinal stenosis of lumbar region   . Type II diabetes mellitus ([place])   . Ventral hernia    Past Surgical History:  Procedure Laterality Date  . ANTERIOR LAT LUMBAR FUSION  10/22/2017   Procedure: Lumbar two-three Lumbar three-four Lumbar four-five Anteriolateral lumbar interbody fusion with percutaneous pedicle screw fixation and infuse;  Surgeon: [name], MD;  Location: [facility];  Service: Neurosurgery;;  . APPLICATION OF ROBOTIC ASSISTANCE FOR SPINAL PROCEDURE  10/22/2017   Procedure: APPLICATION OF ROBOTIC ASSISTANCE FOR SPINAL PROCEDURE;  Surgeon: [name], MD;  Location: [facility];  Service: Neurosurgery;;  . BACK SURGERY    . CARDIAC CATHETERIZATION N/A 07/30/2016   Procedure: Left Heart Cath and Coronary Angiography;  Surgeon: Nelva Bush, MD;  Location: [facility];  Service: Cardiovascular;  Laterality: N/A;  . CARDIAC CATHETERIZATION N/A 07/30/2016   Procedure: Coronary Stent Intervention;  Surgeon: Nelva Bush, MD;  Location: [facility] CV LAB;  Service: Cardiovascular;  Laterality: N/A;  Mid CFX and MID RCA  . CARDIAC CATHETERIZATION N/A 07/30/2016   Procedure: Coronary Balloon Angioplasty;  Surgeon: Nelva Bush, MD;  Location: [facility] LAB;  Service: Cardiovascular;  Laterality: N/A;  OM 1  . CARPAL TUNNEL RELEASE Left   .  CATARACT EXTRACTION W/ INTRAOCULAR LENS  IMPLANT, BILATERAL Bilateral ~ 2014  . CERVICAL DISC SURGERY  1990s  . CORONARY ANGIOPLASTY WITH STENT PLACEMENT  05/2013  . CORONARY ANGIOPLASTY WITH STENT PLACEMENT  07/26/2013   DES to RCA extending to PDA    . CORONARY ANGIOPLASTY WITH STENT PLACEMENT  07/30/2016  . CORONARY ANGIOPLASTY WITH STENT PLACEMENT  2000   CAD  . DENTAL SURGERY  04/2016   "got infected; had to dig it out"  . EYE SURGERY    . KNEE ARTHROSCOPY Right 1990's   rt  . LEFT AND RIGHT HEART CATHETERIZATION WITH CORONARY ANGIOGRAM N/A 07/26/2013   Procedure: LEFT AND RIGHT HEART CATHETERIZATION WITH CORONARY ANGIOGRAM;  Surgeon: [name], MD;  Location: [facility] CATH LAB;  Service: Cardiovascular;  Laterality: N/A;  . LEFT HEART CATH AND CORONARY ANGIOGRAPHY N/A 02/26/2017   Procedure: Left Heart Cath and Coronary Angiography;  Surgeon: Sherren Mocha, MD;  Location: [facility] CV LAB;  Service: Cardiovascular;  Laterality: N/A;  . LEFT HEART CATHETERIZATION WITH CORONARY ANGIOGRAM N/A 05/19/2013   Procedure: LEFT HEART CATHETERIZATION WITH CORONARY ANGIOGRAM;  Surgeon: [name], MD;  Location: [facility] CATH LAB;  Service: Cardiovascular;  Laterality: N/A;  . LUMBAR  LAMINECTOMY/DECOMPRESSION MICRODISCECTOMY  08/30/2012   Procedure: LUMBAR LAMINECTOMY/DECOMPRESSION MICRODISCECTOMY 2 LEVELS;  Surgeon: [name], MD;  Location: [facility] NEURO ORS;  Service: Neurosurgery;  Laterality: Bilateral;  Bilateral Lumbar three-four Lumbar four-five Laminotomies  . LUMBAR PERCUTANEOUS PEDICLE SCREW 1 LEVEL Bilateral 10/22/2017   Procedure: LUMBAR PERCUTANEOUS PEDICLE SCREW 1 LEVEL;  Surgeon: [name], MD;  Location: [facility];  Service: Neurosurgery;  Laterality: Bilateral;  . LUMBAR SPINE SURGERY  04/07/2016   Dr. Ellene Route; "?ruptured disc"  . MELANOMA EXCISION Left 02/2011   forearm  . NASAL SINUS SURGERY  1970s   "cut windows in sinus pockets"  . PERCUTANEOUS CORONARY STENT INTERVENTION (PCI-S)  05/19/2013   Procedure: PERCUTANEOUS CORONARY STENT INTERVENTION (PCI-S);  Surgeon: [name], MD;  Location: [facility] CATH LAB;  Service: Cardiovascular;;  . ULNAR TUNNEL RELEASE Left 04/07/2016   Dr. Ellene Route   Social History   Socioeconomic History  . Marital status: Married    Spouse name: Not on file  . Number of children: Not on file  . Years of education: Not on file  . Highest education level: Not on file  Occupational History  . Occupation: [employer]  . Financial resource strain: Not on file  . Food insecurity    Worry: Not on file    Inability: Not on file  . Transportation needs    Medical: No    Non-medical: No  Tobacco Use  . Smoking status: Former Smoker    Packs/day: 3.00    Years: 30.00    Pack years: 90.00    Types: Cigarettes    Quit date: 09/17/1996    Years since quitting: 22.9  . Smokeless tobacco: Never Used  Substance and Sexual Activity  . Alcohol use: No  . Drug use: No  . Sexual activity: Not on file  Lifestyle  . Physical activity    Days per week: Not on file    Minutes per session: Not on file  . Stress: Not on file  Relationships  . Social Herbalist on phone: Not on file    Gets together: Not on file     Attends religious service: Not on file    Active member of club or organization: Not on  file    Attends meetings of clubs or organizations: Not on file    Relationship status: Not on file  . Intimate partner violence    Fear of current or ex partner: Not on file    Emotionally abused: Not on file    Physically abused: Not on file    Forced sexual activity: Not on file  Other Topics Concern  . Not on file  Social History Narrative   Married and lives locally with his wife.  Sharyon Cable.   Current Outpatient Medications on File Prior to Visit  Medication Sig Dispense Refill  . acetaminophen (TYLENOL) 500 MG tablet Take 500 mg by mouth 2 (two) times daily.     Marland Kitchen albuterol (PROVENTIL HFA;VENTOLIN HFA) 108 (90 Base) MCG/ACT inhaler Inhale 2 puffs into the lungs every 6 (six) hours as needed for wheezing or shortness of breath. (Patient not taking: Reported on 05/10/2019) 1 Inhaler 0  . allopurinol (ZYLOPRIM) 300 MG tablet TAKE 1 TABLET EVERY DAY 90 tablet 1  . aspirin EC 81 MG tablet Take 81 mg by mouth daily.    Marland Kitchen atorvastatin (LIPITOR) 40 MG tablet TAKE 1 TABLET EVERY DAY 90 tablet 1  . B Complex Vitamins (B COMPLEX-B12 PO) Take 1 tablet by mouth once a week.     . BD INSULIN SYRINGE ULTRAFINE 31G X 5/16" 1 ML MISC USE  TO INJECT  INSULIN  FIVE  TIMES  DAILY AS INSTRUCTED 450 each 3  . Cholecalciferol (VITAMIN D3) 25 MCG (1000 UT) CAPS Take 1,000 Units by mouth at bedtime.    . fenofibrate 160 MG tablet TAKE 1 TABLET (160 MG TOTAL) BY MOUTH AT BEDTIME. 90 tablet 1  . fluocinonide cream (LIDEX) [id number] % Apply 1 application topically daily as needed (for rashes).     . fluticasone (FLONASE) 50 MCG/ACT nasal spray Place 2 sprays into both nostrils daily. (Patient not taking: Reported on 05/10/2019) 16 g 1  . furosemide (LASIX) 40 MG tablet TAKE 1 AND 1/2 TABLETS EVERY DAY 135 tablet 3  . HYDROcodone-acetaminophen (NORCO) 10-325 MG tablet Take 0.5 tablets by mouth 2 (two) times daily.  0  . insulin  NPH Human (NOVOLIN N) 100 UNIT/ML injection Inject under skin 30 units in am and 20 in the evening 30 mL 3  . insulin regular (NOVOLIN R RELION) 100 units/mL injection Inject 0.15-0.2 mLs (15-20 Units total) into the skin 3 (three) times daily before meals. 30 mL 3  . isosorbide mononitrate (IMDUR) 30 MG 24 hr tablet Take 1 tablet (30 mg total) by mouth daily. 90 tablet 3  . MAGNESIUM PO Take 1 tablet by mouth at bedtime.    . metoprolol tartrate (LOPRESSOR) 50 MG tablet TAKE 1 TABLET TWICE DAILY (Patient taking differently: Take 25 mg by mouth 2 (two) times daily. ) 180 tablet 1  . metroNIDAZOLE (FLAGYL) 500 MG tablet Take 1 tablet (500 mg total) by mouth 3 (three) times daily. One po bid x 7 days (Patient not taking: Reported on 05/10/2019) 21 tablet 0  . nitroGLYCERIN (NITROSTAT) 0.4 MG SL tablet Place 1 tablet (0.4 mg total) under the tongue every 5 (five) minutes as needed for chest pain. 100 tablet 3  . ondansetron (ZOFRAN ODT) 8 MG disintegrating tablet 8mg  ODT q4 hours prn nausea 6 tablet 0  . pantoprazole (PROTONIX) 20 MG tablet Take 1 tablet (20 mg total) by mouth daily. 90 tablet 1  . Probiotic Product (PROBIOTIC PO) Take 1 capsule by mouth at bedtime.     Marland Kitchen  tamsulosin (FLOMAX) 0.4 MG CAPS capsule Take 1 capsule (0.4 mg total) by mouth 2 (two) times daily. 180 capsule 1  . tiZANidine (ZANAFLEX) 4 MG tablet Take 4 mg by mouth 2 (two) times daily.  6  . TRUE METRIX BLOOD GLUCOSE TEST test strip TEST BLOOD SUGAR THREE TIMES DAILY AS DIRECTED 300 strip 11   No current facility-administered medications on file prior to visit.    Allergies  Allergen Reactions  . Brilinta [Ticagrelor] Shortness Of Breath  . Levemir [Insulin Detemir] Swelling and Other (See Comments)    Patient had redness and swelling and tenderness at injection site.  . Bee Venom   . Codeine Other (See Comments)    Makes him "shakey", is OK with hydrocodone GI UPSET & TREMORS  . Lipitor [Atorvastatin] Other (See  Comments)    Muscle aches; affects liver function- Patient is currently taking  . Novolog Mix [Insulin Aspart Prot & Aspart] Other (See Comments)    Causes skin to swell/itch at injection site   Family History  Problem Relation Age of Onset  . Cancer Mother        intestinal   . Heart disease Mother   . Cancer Father        prostate  . Migraines Daughter   . Leukemia Sister   . Heart disease Sister   . Prostate cancer Other   . Kidney cancer Other   . Cancer Other        Bladder cancer  . Coronary artery disease Other   . Hypertension Sister   . Hypertension Brother   . Heart attack Neg Hx   . Stroke Neg Hx     PE: BP 120/60   Pulse 72   Ht 5' 10.5" (1.791 m)   Wt 248 lb (112.5 kg)   SpO2 96%   BMI 35.08 kg/m  Body mass index is 35.08 kg/m. Wt Readings from Last 3 Encounters:  08/29/19 248 lb (112.5 kg)  08/18/19 238 lb 8 oz (108.2 kg)  06/15/19 243 lb 1.6 oz (110.3 kg)   Constitutional: overweight, in NAD Eyes: PERRLA, EOMI, no exophthalmos ENT: moist mucous membranes, no thyromegaly, no cervical lymphadenopathy Cardiovascular: RRR, No MRG Respiratory: CTA B Gastrointestinal: abdomen soft, NT, ND, BS+ Musculoskeletal: no deformities, strength intact in all 4 Skin: moist, warm, no rashes Neurological: no tremor with outstretched hands, DTR normal in all 4  ASSESSMENT: 1. DM2, insulin-dependent, uncontrolled, with complications - CAD, s/p stents - He had stenting of distal RCA/PDA in 07/2013. Prior stenting of LAD in 04/2013. 2 new stents: PCI to left circumflex and the mid RCA on 07/30/2016. - AAA - CKD stage 3  2. HL  3.  Obesity class II BMI Classification:  < 18.5 underweight   18.5-24.9 normal weight   25.0-29.9 overweight   30.0-34.9 class I obesity   35.0-39.9 class II obesity   ? 40.0 class III obesity   PLAN:  1. Patient with history of uncontrolled type 2 diabetes, on basal-bolus insulin regimen, with NPH and regular insulin due to  price.  He had significantly improved months of his sugars have swelling and eating meat from his diet almost completely.  At last visit, sugars are almost all goal and he was telling me that they states like this even if he was missing insulin for an entire day.  We decreased the doses of insulin at that time but I advised him to take this consistently. -At this visit, we reviewed together his latest  HbA1c from earlier this month and this was higher, at 7.2% -He has more hyperglycemic spikes at all times of the day, but he does not feel that he changed his diet in the last few months.  For now, I advised him to increase his NPH doses by 5 units with each of these doses, but will keep the regular insulin the same.  We discussed about using a higher dose of regular insulin now around the time of the holidays.  I advised him that if the sugars improve, he can back off again on the NPH. - I advised him to: Patient Instructions   Please continue: Insulin Before breakfast Before lunch Before dinner  Regular 15-20 15-20 15-20 (if sugars <100, take 10 units)  NPH 30 >> 35  20 >> 25   If you take dinnertime insulin after dinner, take 50% of the dose.  Please return in 4 months with your sugar log.    - advised to check sugars at different times of the day - 3x a day, rotating check times - advised for yearly eye exams >> he is not UTD - UTD with flu shot - return to clinic in 4 months     2. HL -Reviewed latest lipid panel from earlier this month, LDL excellent, triglycerides high, HDL low Lab Results  Component Value Date   CHOL 123 08/14/2019   HDL 23.90 (L) 08/14/2019   LDLCALC 53 05/11/2019   LDLDIRECT 64.0 08/14/2019   TRIG 217.0 (H) 08/14/2019   CHOLHDL 5 08/14/2019  -Continues Lipitor, fenofibrate, flaxseed oil, without side effects  3.  Obesity  -He did very well on the plant-based diet and I again encouraged him to continue -He lost 7 pounds before last visit, but possibly also  due to diarrhea, but no significant net weight loss since last OV  [name], MD PhD [facility]

## 2019-08-29 NOTE — Patient Instructions (Addendum)
Please continue: Insulin Before breakfast Before lunch Before dinner  Regular 15-20 15-20 15-20 (if sugars <100, take 10 units)  NPH 30 >> 35  20 >> 25   If you take dinnertime insulin after dinner, take 50% of the dose.  Please return in 4 months with your sugar log.

## 2019-09-01 DIAGNOSIS — G4733 Obstructive sleep apnea (adult) (pediatric): Secondary | ICD-10-CM | POA: Diagnosis not present

## 2019-09-15 ENCOUNTER — Other Ambulatory Visit: Payer: Self-pay

## 2019-09-15 ENCOUNTER — Encounter: Payer: Self-pay | Admitting: Family Medicine

## 2019-09-15 ENCOUNTER — Ambulatory Visit (INDEPENDENT_AMBULATORY_CARE_PROVIDER_SITE_OTHER): Payer: Medicare HMO | Admitting: Family Medicine

## 2019-09-15 VITALS — BP 110/58 | HR 88 | Temp 97.9°F | Resp 18 | Ht 70.5 in | Wt 256.0 lb

## 2019-09-15 DIAGNOSIS — N390 Urinary tract infection, site not specified: Secondary | ICD-10-CM

## 2019-09-15 DIAGNOSIS — R300 Dysuria: Secondary | ICD-10-CM | POA: Diagnosis not present

## 2019-09-15 LAB — POCT URINALYSIS DIP (MANUAL ENTRY)
Blood, UA: NEGATIVE
Glucose, UA: NEGATIVE mg/dL
Ketones, POC UA: NEGATIVE mg/dL
Nitrite, UA: POSITIVE — AB
Protein Ur, POC: NEGATIVE mg/dL
Spec Grav, UA: 1.02 (ref 1.010–1.025)
Urobilinogen, UA: 2 E.U./dL — AB
pH, UA: 5 (ref 5.0–8.0)

## 2019-09-15 MED ORDER — CIPROFLOXACIN HCL 500 MG PO TABS: 500.0000 mg | ORAL_TABLET | Freq: Two times a day (BID) | ORAL | 0 refills | Status: DC

## 2019-09-15 MED ORDER — CEFTRIAXONE SODIUM 1 G IJ SOLR
1.0000 g | Freq: Once | INTRAMUSCULAR | Status: AC
  Administered 2019-09-15: 1 g via INTRAMUSCULAR

## 2019-09-15 NOTE — Progress Notes (Signed)
Patient ID: William Hendrix, male    DOB: 04-Sep-1941  Age: 78 y.o. MRN: [id number]    Subjective:  Subjective  HPI KEYWON TKACHENKO presents for dysuria and frequency -- x 3-4 days  + flank pain   No blood no fevers     Pt has a hx of uti and kidney stones     Review of Systems  Constitutional: Negative for chills and fever.  HENT: Negative for congestion and hearing loss.   Eyes: Negative for discharge.  Respiratory: Negative for cough and shortness of breath.   Cardiovascular: Negative for chest pain, palpitations and leg swelling.  Gastrointestinal: Negative for abdominal pain, blood in stool, constipation, diarrhea, nausea and vomiting.  Genitourinary: Positive for dysuria, flank pain and frequency. Negative for hematuria and urgency.  Musculoskeletal: Negative for back pain and myalgias.  Skin: Negative for rash.  Allergic/Immunologic: Negative for environmental allergies.  Neurological: Negative for dizziness, weakness and headaches.  Hematological: Does not bruise/bleed easily.  Psychiatric/Behavioral: Negative for suicidal ideas. The patient is not nervous/anxious.     History Past Medical History:  Diagnosis Date   AAA (abdominal aortic aneurysm) ([place]) 12/15/2014   a. Mild aneurysmal dilatation of the infrarenal abdominal aorta 3.2 cm - f/u due by 2019.   Arthritis    "all over" (07/30/2016)   CAD (coronary artery disease)    a. stent to LAD 2000. b. possible spasm by cath 2001. c. IVUS/PTCA/DES to mLAD 05/2013. d. PTCA/DES of dRCA into ostial rPDA 07/2013. e. PTCA of OM2, DES to Cx, DES to [facility] 07/2016.   Chronic bronchitis (HCC)    Chronic diastolic CHF (congestive heart failure) (HCC)    Chronic lower back pain    Chronic neck pain    CKD (chronic kidney disease), stage III    Diabetic peripheral neuropathy (HCC)    Ejection fraction    55%, 07/2010, mild inferior hypo   GERD (gastroesophageal reflux disease)    Gout    H/O diverticulitis of colon  01/31/2015   H/O hiatal hernia    History of blood transfusion ~ 10/1940   "had pneumonia"   History of kidney stones    HTN (hypertension)    Hyperlipidemia    Melanoma of forearm, left ([place]) 02/2011   with wide excision    Myocardial infarction ([place])    Nephrolithiasis    Obesity    OSA on CPAP     [name] since 2000   Peripheral neuropathy    both feet   Positive D-dimer    a. significant elevation ,hospital 07/2010, etiology unclear. b. D Dimer chronically > 20.   Renal insufficiency    Skin cancer    Spinal stenosis    a. s/p surgical repair 2013   Spinal stenosis of lumbar region    Type II diabetes mellitus ([place])    Ventral hernia     He has a past surgical history that includes Knee arthroscopy (Right, 1990's); Cervical disc surgery (1990s); Lumbar laminectomy/decompression microdiscectomy (08/30/2012); Back surgery; left heart catheterization with coronary angiogram (N/A, 05/19/2013); percutaneous coronary stent intervention (pci-s) (05/19/2013); left and right heart catheterization with coronary angiogram (N/A, 07/26/2013); Lumbar spine surgery (04/07/2016); Carpal tunnel release (Left); Ulnar tunnel release (Left, 04/07/2016); Coronary angioplasty with stent (05/2013); Coronary angioplasty with stent (07/26/2013); Coronary angioplasty with stent (07/30/2016); Coronary angioplasty with stent (2000); Cataract extraction w/ intraocular lens  implant, bilateral (Bilateral, ~ 2014); Nasal sinus surgery (1970s); Dental surgery (04/2016); Melanoma excision (Left, 02/2011); Cardiac catheterization (N/A,  07/30/2016); Cardiac catheterization (N/A, 07/30/2016); Cardiac catheterization (N/A, 07/30/2016); LEFT HEART CATH AND CORONARY ANGIOGRAPHY (N/A, 02/26/2017); Eye surgery; Anterior lat lumbar fusion (10/22/2017); Lumbar percutaneous pedicle screw 1 level (Bilateral, [id number]); and Application of robotic assistance for spinal procedure (10/22/2017).   His family history  includes Cancer in his father, mother, and another family member; Coronary artery disease in an other family member; Heart disease in his mother and sister; Hypertension in his brother and sister; Kidney cancer in an other family member; Leukemia in his sister; Migraines in his daughter; Prostate cancer in an other family member.He reports that he quit smoking about 23 years ago. His smoking use included cigarettes. He has a 90.00 pack-year smoking history. He has never used smokeless tobacco. He reports that he does not drink alcohol or use drugs.  Current Outpatient Medications on File Prior to Visit  Medication Sig Dispense Refill   acetaminophen (TYLENOL) 500 MG tablet Take 500 mg by mouth 2 (two) times daily.      albuterol (PROVENTIL HFA;VENTOLIN HFA) 108 (90 Base) MCG/ACT inhaler Inhale 2 puffs into the lungs every 6 (six) hours as needed for wheezing or shortness of breath. 1 Inhaler 0   allopurinol (ZYLOPRIM) 300 MG tablet TAKE 1 TABLET EVERY DAY 90 tablet 1   aspirin EC 81 MG tablet Take 81 mg by mouth daily.     atorvastatin (LIPITOR) 40 MG tablet TAKE 1 TABLET EVERY DAY 90 tablet 1   B Complex Vitamins (B COMPLEX-B12 PO) Take 1 tablet by mouth once a week.      BD INSULIN SYRINGE ULTRAFINE 31G X 5/16" 1 ML MISC USE  TO INJECT  INSULIN  FIVE  TIMES  DAILY AS INSTRUCTED 450 each 3   Cholecalciferol (VITAMIN D3) 25 MCG (1000 UT) CAPS Take 1,000 Units by mouth at bedtime.     fenofibrate 160 MG tablet TAKE 1 TABLET (160 MG TOTAL) BY MOUTH AT BEDTIME. 90 tablet 1   fluocinonide cream (LIDEX) [id number] % Apply 1 application topically daily as needed (for rashes).      fluticasone (FLONASE) 50 MCG/ACT nasal spray Place 2 sprays into both nostrils daily. 16 g 1   furosemide (LASIX) 40 MG tablet TAKE 1 AND 1/2 TABLETS EVERY DAY 135 tablet 3   HYDROcodone-acetaminophen (NORCO) 10-325 MG tablet Take 0.5 tablets by mouth 2 (two) times daily.  0   insulin NPH Human (NOVOLIN N) 100 UNIT/ML  injection Inject under skin 35 units in am and 25 in the evening 30 mL 3   insulin regular (NOVOLIN R RELION) 100 units/mL injection Inject 0.15-0.2 mLs (15-20 Units total) into the skin 3 (three) times daily before meals. 30 mL 3   isosorbide mononitrate (IMDUR) 30 MG 24 hr tablet Take 1 tablet (30 mg total) by mouth daily. 90 tablet 3   MAGNESIUM PO Take 1 tablet by mouth at bedtime.     metoprolol tartrate (LOPRESSOR) 50 MG tablet TAKE 1 TABLET TWICE DAILY (Patient taking differently: Take 25 mg by mouth 2 (two) times daily. ) 180 tablet 1   nitroGLYCERIN (NITROSTAT) 0.4 MG SL tablet Place 1 tablet (0.4 mg total) under the tongue every 5 (five) minutes as needed for chest pain. 100 tablet 3   ondansetron (ZOFRAN ODT) 8 MG disintegrating tablet 8mg  ODT q4 hours prn nausea 6 tablet 0   pantoprazole (PROTONIX) 20 MG tablet Take 1 tablet (20 mg total) by mouth daily. 90 tablet 1   Probiotic Product (PROBIOTIC PO) Take 1 capsule by  mouth at bedtime.      tiZANidine (ZANAFLEX) 4 MG tablet Take 4 mg by mouth 2 (two) times daily.  6   TRUE METRIX BLOOD GLUCOSE TEST test strip TEST BLOOD SUGAR THREE TIMES DAILY AS DIRECTED 300 strip 11   metroNIDAZOLE (FLAGYL) 500 MG tablet Take 1 tablet (500 mg total) by mouth 3 (three) times daily. One po bid x 7 days (Patient not taking: Reported on 09/15/2019) 21 tablet 0   No current facility-administered medications on file prior to visit.      Objective:  Objective  Physical Exam Vitals signs and nursing note reviewed.  Constitutional:      General: He is sleeping.     Appearance: He is well-developed.  HENT:     Head: Normocephalic and atraumatic.  Eyes:     Pupils: Pupils are equal, round, and reactive to light.  Neck:     Musculoskeletal: Normal range of motion and neck supple.     Thyroid: No thyromegaly.  Cardiovascular:     Rate and Rhythm: Normal rate and regular rhythm.     Heart sounds: No murmur.  Pulmonary:     Effort:  Pulmonary effort is normal. No respiratory distress.     Breath sounds: Normal breath sounds. No wheezing or rales.  Chest:     Chest wall: No tenderness.  Musculoskeletal:        General: No tenderness.  Skin:    General: Skin is warm and dry.  Neurological:     Mental Status: He is oriented to person, place, and time.  Psychiatric:        Behavior: Behavior normal.        Thought Content: Thought content normal.        Judgment: Judgment normal.    BP (!) 110/58 (BP Location: Right Arm, Patient Position: Sitting, Cuff Size: Large)    Pulse 88    Temp 97.9 F (36.6 C) (Temporal)    Resp 18    Ht 5' 10.5" (1.791 m)    Wt 256 lb (116.1 kg)    SpO2 96%    BMI 36.21 kg/m  Wt Readings from Last 3 Encounters:  09/15/19 256 lb (116.1 kg)  08/29/19 248 lb (112.5 kg)  08/18/19 238 lb 8 oz (108.2 kg)     Lab Results  Component Value Date   WBC 6.2 08/14/2019   HGB 13.4 08/14/2019   HCT 40.2 08/14/2019   PLT 194.0 08/14/2019   GLUCOSE 189 (H) 08/14/2019   CHOL 123 08/14/2019   TRIG 217.0 (H) 08/14/2019   HDL 23.90 (L) 08/14/2019   LDLDIRECT 64.0 08/14/2019   LDLCALC 53 05/11/2019   ALT 18 08/14/2019   AST 19 08/14/2019   NA 140 08/14/2019   K 3.8 08/14/2019   CL 104 08/14/2019   CREATININE 1.40 08/14/2019   BUN 30 (H) 08/14/2019   CO2 29 08/14/2019   TSH 3.83 08/14/2019   PSA 1.01 11/02/2018   INR 1.1 02/24/2017   HGBA1C 7.2 (H) 08/14/2019   MICROALBUR 0.7 08/02/2014     .urine-----+ nitrates and + leuk    No blood  No results found.   Assessment & Plan:  Plan  I have discontinued Elyn Aquas. Waner "Bob"'s tamsulosin. I am also having him start on ciprofloxacin. Additionally, I am having him maintain his fluocinonide cream, aspirin EC, acetaminophen, B Complex Vitamins (B COMPLEX-B12 PO), albuterol, BD Insulin Syringe Ultrafine, fluticasone, Probiotic Product (PROBIOTIC PO), nitroGLYCERIN, tiZANidine, Vitamin D3, MAGNESIUM PO, HYDROcodone-acetaminophen,  isosorbide  mononitrate, metroNIDAZOLE, ondansetron, insulin regular, pantoprazole, metoprolol tartrate, fenofibrate, allopurinol, atorvastatin, furosemide, True Metrix Blood Glucose Test, and insulin NPH Human. We administered cefTRIAXone.  Meds ordered this encounter  Medications   ciprofloxacin (CIPRO) 500 MG tablet    Sig: Take 1 tablet (500 mg total) by mouth 2 (two) times daily.    Dispense:  20 tablet    Refill:  0   cefTRIAXone (ROCEPHIN) injection 1 g    Problem List Items Addressed This Visit      Unprioritized   UTI (urinary tract infection)   Relevant Medications   ciprofloxacin (CIPRO) 500 MG tablet    Other Visit Diagnoses    Dysuria    -  Primary   Relevant Orders   POCT urinalysis dipstick (Completed)   Urine culture    pt to call over the weekend if pain worsens or go to ER F/u pcp   Follow-up: No follow-ups on file.  Ann Held, DO

## 2019-09-15 NOTE — Patient Instructions (Signed)

## 2019-09-17 LAB — URINE CULTURE
MICRO NUMBER:: 1164959
SPECIMEN QUALITY:: ADEQUATE

## 2019-10-01 DIAGNOSIS — G4733 Obstructive sleep apnea (adult) (pediatric): Secondary | ICD-10-CM | POA: Diagnosis not present

## 2019-10-23 ENCOUNTER — Telehealth: Payer: Self-pay | Admitting: Internal Medicine

## 2019-10-23 DIAGNOSIS — Z794 Long term (current) use of insulin: Secondary | ICD-10-CM

## 2019-10-23 DIAGNOSIS — E1159 Type 2 diabetes mellitus with other circulatory complications: Secondary | ICD-10-CM

## 2019-10-23 MED ORDER — INSULIN REGULAR HUMAN 100 UNIT/ML IJ SOLN: 15.0000 [IU] | Freq: Three times a day (TID) | INTRAMUSCULAR | 1 refills | Status: DC

## 2019-10-23 MED ORDER — INSULIN NPH (HUMAN) (ISOPHANE) 100 UNIT/ML ~~LOC~~ SUSP: SUBCUTANEOUS | 1 refills | Status: DC

## 2019-10-23 NOTE — Telephone Encounter (Signed)
90 day supply of both sent to [facility] mail order.

## 2019-10-23 NOTE — Telephone Encounter (Signed)
Patient has [employer] that offers a Hydrologist. Patient requests the following 2 RX's:  MEDICATION:  insulin regular (NOVOLIN R RELION) 100 units/mL injection fast acting AND  insulin NPH Human (NOVOLIN N) 100 UNIT/ML injection  PHARMACY:   [place] [place] Phone:  [phone]  Fax:  [phone]      IS THIS A 90 DAY SUPPLY : If possible-if not must be at least 1 month RX  IS PATIENT OUT OF MEDICATION: No  IF NOT; HOW MUCH IS LEFT: ?  LAST APPOINTMENT DATE: @11 /17/2020  NEXT APPOINTMENT DATE:@3 /18/2021  DO WE HAVE YOUR PERMISSION TO LEAVE A DETAILED MESSAGE: Yes-on ph# [phone]  OTHER COMMENTS:    **Let patient know to contact pharmacy at the end of the day to make sure medication is ready. **  ** Please notify patient to allow 48-72 hours to process**  **Encourage patient to contact the pharmacy for refills or they can request refills through [facility]**

## 2019-10-25 DIAGNOSIS — H524 Presbyopia: Secondary | ICD-10-CM | POA: Diagnosis not present

## 2019-10-25 DIAGNOSIS — Z961 Presence of intraocular lens: Secondary | ICD-10-CM | POA: Diagnosis not present

## 2019-10-25 DIAGNOSIS — E119 Type 2 diabetes mellitus without complications: Secondary | ICD-10-CM | POA: Diagnosis not present

## 2019-10-25 DIAGNOSIS — Z794 Long term (current) use of insulin: Secondary | ICD-10-CM | POA: Diagnosis not present

## 2019-10-27 ENCOUNTER — Other Ambulatory Visit: Payer: Self-pay

## 2019-10-27 DIAGNOSIS — E1159 Type 2 diabetes mellitus with other circulatory complications: Secondary | ICD-10-CM

## 2019-10-27 DIAGNOSIS — Z794 Long term (current) use of insulin: Secondary | ICD-10-CM

## 2019-10-27 MED ORDER — NOVOLIN N FLEXPEN 100 UNIT/ML ~~LOC~~ SUPN: PEN_INJECTOR | SUBCUTANEOUS | 1 refills | Status: DC

## 2019-10-30 ENCOUNTER — Other Ambulatory Visit: Payer: Self-pay

## 2019-10-30 DIAGNOSIS — E1159 Type 2 diabetes mellitus with other circulatory complications: Secondary | ICD-10-CM

## 2019-10-30 MED ORDER — INSULIN NPH (HUMAN) (ISOPHANE) 100 UNIT/ML ~~LOC~~ SUSP: SUBCUTANEOUS | 1 refills | Status: DC

## 2019-10-31 ENCOUNTER — Other Ambulatory Visit: Payer: Self-pay

## 2019-10-31 MED ORDER — INSULIN REGULAR HUMAN 100 UNIT/ML IJ SOLN: INTRAMUSCULAR | 1 refills | Status: DC

## 2019-11-01 DIAGNOSIS — G4733 Obstructive sleep apnea (adult) (pediatric): Secondary | ICD-10-CM | POA: Diagnosis not present

## 2019-11-21 ENCOUNTER — Other Ambulatory Visit (INDEPENDENT_AMBULATORY_CARE_PROVIDER_SITE_OTHER): Payer: Medicare HMO

## 2019-11-21 ENCOUNTER — Other Ambulatory Visit: Payer: Self-pay

## 2019-11-21 DIAGNOSIS — E1165 Type 2 diabetes mellitus with hyperglycemia: Secondary | ICD-10-CM | POA: Diagnosis not present

## 2019-11-21 DIAGNOSIS — R7989 Other specified abnormal findings of blood chemistry: Secondary | ICD-10-CM | POA: Diagnosis not present

## 2019-11-21 DIAGNOSIS — Z794 Long term (current) use of insulin: Secondary | ICD-10-CM

## 2019-11-21 DIAGNOSIS — E785 Hyperlipidemia, unspecified: Secondary | ICD-10-CM | POA: Diagnosis not present

## 2019-11-21 DIAGNOSIS — I10 Essential (primary) hypertension: Secondary | ICD-10-CM | POA: Diagnosis not present

## 2019-11-21 DIAGNOSIS — E1159 Type 2 diabetes mellitus with other circulatory complications: Secondary | ICD-10-CM

## 2019-11-21 DIAGNOSIS — IMO0002 Reserved for concepts with insufficient information to code with codable children: Secondary | ICD-10-CM

## 2019-11-21 LAB — COMPREHENSIVE METABOLIC PANEL
ALT: 23 U/L (ref 0–53)
AST: 22 U/L (ref 0–37)
Albumin: 4.2 g/dL (ref 3.5–5.2)
Alkaline Phosphatase: 61 U/L (ref 39–117)
BUN: 26 mg/dL — ABNORMAL HIGH (ref 6–23)
CO2: 31 mEq/L (ref 19–32)
Calcium: 10.2 mg/dL (ref 8.4–10.5)
Chloride: 103 mEq/L (ref 96–112)
Creatinine, Ser: 1.54 mg/dL — ABNORMAL HIGH (ref 0.40–1.50)
GFR: 43.72 mL/min — ABNORMAL LOW (ref >60.00)
Glucose, Bld: 177 mg/dL — ABNORMAL HIGH (ref 70–99)
Potassium: 4.6 mEq/L (ref 3.5–5.1)
Sodium: 140 mEq/L (ref 135–145)
Total Bilirubin: 0.6 mg/dL (ref 0.2–1.2)
Total Protein: 7 g/dL (ref 6.0–8.3)

## 2019-11-21 LAB — CBC
HCT: 40.6 % (ref 39.0–52.0)
Hemoglobin: 13.6 g/dL (ref 13.0–17.0)
MCHC: 33.6 g/dL (ref 30.0–36.0)
MCV: 91.3 fl (ref 78.0–100.0)
Platelets: 194 10*3/uL (ref 150.0–400.0)
RBC: 4.44 Mil/uL (ref 4.22–5.81)
RDW: 14.8 % (ref 11.5–15.5)
WBC: 5.9 10*3/uL (ref 4.0–10.5)

## 2019-11-21 LAB — LIPID PANEL
Cholesterol: 113 mg/dL (ref 0–200)
HDL: 21.5 mg/dL — ABNORMAL LOW (ref >39.00)
NonHDL: 91.34
Total CHOL/HDL Ratio: 5
Triglycerides: 250 mg/dL — ABNORMAL HIGH (ref 0.0–149.0)
VLDL: 50 mg/dL — ABNORMAL HIGH (ref 0.0–40.0)

## 2019-11-21 LAB — LDL CHOLESTEROL, DIRECT: Direct LDL: 57 mg/dL

## 2019-11-21 LAB — T4, FREE: Free T4: 0.89 ng/dL (ref 0.60–1.60)

## 2019-11-21 LAB — HEMOGLOBIN A1C: Hgb A1c MFr Bld: 8 % — ABNORMAL HIGH (ref 4.6–6.5)

## 2019-11-21 LAB — TSH: TSH: 4.59 u[IU]/mL — ABNORMAL HIGH (ref 0.35–4.50)

## 2019-11-24 ENCOUNTER — Ambulatory Visit (INDEPENDENT_AMBULATORY_CARE_PROVIDER_SITE_OTHER): Payer: Medicare HMO | Admitting: Family Medicine

## 2019-11-24 ENCOUNTER — Other Ambulatory Visit: Payer: Self-pay

## 2019-11-24 DIAGNOSIS — E785 Hyperlipidemia, unspecified: Secondary | ICD-10-CM | POA: Diagnosis not present

## 2019-11-24 DIAGNOSIS — Z794 Long term (current) use of insulin: Secondary | ICD-10-CM

## 2019-11-24 DIAGNOSIS — E118 Type 2 diabetes mellitus with unspecified complications: Secondary | ICD-10-CM

## 2019-11-24 DIAGNOSIS — M1000 Idiopathic gout, unspecified site: Secondary | ICD-10-CM | POA: Diagnosis not present

## 2019-11-24 DIAGNOSIS — N289 Disorder of kidney and ureter, unspecified: Secondary | ICD-10-CM

## 2019-11-24 DIAGNOSIS — I10 Essential (primary) hypertension: Secondary | ICD-10-CM

## 2019-11-24 MED ORDER — "INSULIN SYRINGE-NEEDLE U-100 31G X 5/16"" 0.3 ML MISC": 11 refills | Status: DC

## 2019-11-24 MED ORDER — TRUEPLUS LANCETS 33G MISC: 11 refills | Status: DC

## 2019-11-26 NOTE — Assessment & Plan Note (Signed)
Hydrate and monitor 

## 2019-11-26 NOTE — Patient Instructions (Signed)
[facility] Blood Pressure cuff, upper arm, want BP 100-140/60-90 Pulse oximeter, want oxygen in 90s  Weekly vitals  Take Multivitamin with minerals, selenium Vitamin D 1000-2000 IU daily Probiotic with lactobacillus and bifidophilus Asprin EC 81 mg daily  Melatonin 2-5 mg at bedtime  [email or web address] 

## 2019-11-26 NOTE — Assessment & Plan Note (Signed)
Has been labile and somewhat elevated recently ranging from 91 to 222 over the past few weeks. She has been taking her N at 35 in am and 25 in pm. Her low numbers are always in the morning. Have recommended she increase her am dose to 37 units. She will maintain her R at 20 units tid with meals. She has follow up with endocrinology soon. Increase activity as able and minimize simple carbohydrates

## 2019-11-26 NOTE — Assessment & Plan Note (Signed)
Well controlled, no changes to meds. Encouraged heart healthy diet such as the DASH diet and exercise as tolerated.  °

## 2019-11-26 NOTE — Assessment & Plan Note (Signed)
Hydrate, continue current treatment and monitor uric acid levels

## 2019-11-26 NOTE — Progress Notes (Signed)
Virtual Visit via Video Note  I connected with William Hendrix on 11/24/19 at 10:40 AM EST by a video enabled telemedicine application and verified that I am speaking with the correct person using two identifiers.  Location: Patient: home Provider: home   I discussed the limitations of evaluation and management by telemedicine and the availability of in person appointments. The patient expressed understanding and agreed to proceed. Wynonia Musty, CMA was able to get the patient set up on a phone visit after being unable to set up a video visit   Subjective:    Patient ID: William Hendrix, male    DOB: November 17, 1940, 79 y.o.   MRN: [id number]  No chief complaint on file.   HPI Patient is in today for follow up on chronic medical concerns, he feels well today. No recent febrile illness or hospitalizations. He is maintaining quarantine well. Has been labile and somewhat elevated recently ranging from 91 to 222 over the past few weeks. She has been taking her N at 35 in am and 25 in pm. Her low numbers are always in the morning. Have recommended she increase her am dose to 37 units. She will maintain her R at 20 units tid with meals. She has follow up with endocrinology soon. Increase activity as able and minimize simple carbohydrates. Denies CP/palp/SOB/HA/congestion/fevers/GI or GU c/o. Taking meds as prescribed   Past Medical History:  Diagnosis Date  . AAA (abdominal aortic aneurysm) ([facility]) 12/15/2014   a. Mild aneurysmal dilatation of the infrarenal abdominal aorta 3.2 cm - f/u due by 2019.  . Arthritis    "all over" (07/30/2016)  . CAD (coronary artery disease)    a. stent to LAD 2000. b. possible spasm by cath 2001. c. IVUS/PTCA/DES to mLAD 05/2013. d. PTCA/DES of dRCA into ostial rPDA 07/2013. e. PTCA of OM2, DES to Cx, DES to [facility] 07/2016.  Marland Kitchen Chronic bronchitis ([place])   . Chronic diastolic CHF (congestive heart failure) ([place])   . Chronic lower back pain   . Chronic neck pain   . CKD  (chronic kidney disease), stage III   . Diabetic peripheral neuropathy ([place])   . Ejection fraction    55%, 07/2010, mild inferior hypo  . GERD (gastroesophageal reflux disease)   . Gout   . H/O diverticulitis of colon 01/31/2015  . H/O hiatal hernia   . History of blood transfusion ~ 10/1940   "had pneumonia"  . History of kidney stones   . HTN (hypertension)   . Hyperlipidemia   . Melanoma of forearm, left ([place]) 02/2011   with wide excision   . Myocardial infarction ([place])   . Nephrolithiasis   . Obesity   . OSA on CPAP     [name] since 2000  . Peripheral neuropathy    both feet  . Positive D-dimer    a. significant elevation ,hospital 07/2010, etiology unclear. b. D Dimer chronically > 20.  Marland Kitchen Renal insufficiency   . Skin cancer   . Spinal stenosis    a. s/p surgical repair 2013  . Spinal stenosis of lumbar region   . Type II diabetes mellitus ([place])   . Ventral hernia     Past Surgical History:  Procedure Laterality Date  . ANTERIOR LAT LUMBAR FUSION  10/22/2017   Procedure: Lumbar two-three Lumbar three-four Lumbar four-five Anteriolateral lumbar interbody fusion with percutaneous pedicle screw fixation and infuse;  Surgeon: [name], MD;  Location: [place];  Service: Neurosurgery;;  . APPLICATION OF  ROBOTIC ASSISTANCE FOR SPINAL PROCEDURE  10/22/2017   Procedure: APPLICATION OF ROBOTIC ASSISTANCE FOR SPINAL PROCEDURE;  Surgeon: [name], MD;  Location: [place];  Service: Neurosurgery;;  . BACK SURGERY    . CARDIAC CATHETERIZATION N/A 07/30/2016   Procedure: Left Heart Cath and Coronary Angiography;  Surgeon: Nelva Bush, MD;  Location: [place] CV LAB;  Service: Cardiovascular;  Laterality: N/A;  . CARDIAC CATHETERIZATION N/A 07/30/2016   Procedure: Coronary Stent Intervention;  Surgeon: Nelva Bush, MD;  Location: [address] CV LAB;  Service: Cardiovascular;  Laterality: N/A;  Mid CFX and MID RCA  . CARDIAC CATHETERIZATION N/A 07/30/2016   Procedure:  Coronary Balloon Angioplasty;  Surgeon: Nelva Bush, MD;  Location: [place] CV LAB;  Service: Cardiovascular;  Laterality: N/A;  OM 1  . CARPAL TUNNEL RELEASE Left   . CATARACT EXTRACTION W/ INTRAOCULAR LENS  IMPLANT, BILATERAL Bilateral ~ 2014  . CERVICAL DISC SURGERY  1990s  . CORONARY ANGIOPLASTY WITH STENT PLACEMENT  05/2013  . CORONARY ANGIOPLASTY WITH STENT PLACEMENT  07/26/2013   DES to RCA extending to PDA    . CORONARY ANGIOPLASTY WITH STENT PLACEMENT  07/30/2016  . CORONARY ANGIOPLASTY WITH STENT PLACEMENT  2000   CAD  . DENTAL SURGERY  04/2016   "got infected; had to dig it out"  . EYE SURGERY    . KNEE ARTHROSCOPY Right 1990's   rt  . LEFT AND RIGHT HEART CATHETERIZATION WITH CORONARY ANGIOGRAM N/A 07/26/2013   Procedure: LEFT AND RIGHT HEART CATHETERIZATION WITH CORONARY ANGIOGRAM;  Surgeon: [name], MD;  Location: [place] CATH LAB;  Service: Cardiovascular;  Laterality: N/A;  . LEFT HEART CATH AND CORONARY ANGIOGRAPHY N/A 02/26/2017   Procedure: Left Heart Cath and Coronary Angiography;  Surgeon: Sherren Mocha, MD;  Location: [place] CV LAB;  Service: Cardiovascular;  Laterality: N/A;  . LEFT HEART CATHETERIZATION WITH CORONARY ANGIOGRAM N/A 05/19/2013   Procedure: LEFT HEART CATHETERIZATION WITH CORONARY ANGIOGRAM;  Surgeon: [name], MD;  Location: [facility] CATH LAB;  Service: Cardiovascular;  Laterality: N/A;  . LUMBAR LAMINECTOMY/DECOMPRESSION MICRODISCECTOMY  08/30/2012   Procedure: LUMBAR LAMINECTOMY/DECOMPRESSION MICRODISCECTOMY 2 LEVELS;  Surgeon: [name], MD;  Location: [place] NEURO ORS;  Service: Neurosurgery;  Laterality: Bilateral;  Bilateral Lumbar three-four Lumbar four-five Laminotomies  . LUMBAR PERCUTANEOUS PEDICLE SCREW 1 LEVEL Bilateral 10/22/2017   Procedure: LUMBAR PERCUTANEOUS PEDICLE SCREW 1 LEVEL;  Surgeon: [name], MD;  Location: [place];  Service: Neurosurgery;  Laterality: Bilateral;  . LUMBAR SPINE SURGERY  04/07/2016   Dr.  Ellene Route; "?ruptured disc"  . MELANOMA EXCISION Left 02/2011   forearm  . NASAL SINUS SURGERY  1970s   "cut windows in sinus pockets"  . PERCUTANEOUS CORONARY STENT INTERVENTION (PCI-S)  05/19/2013   Procedure: PERCUTANEOUS CORONARY STENT INTERVENTION (PCI-S);  Surgeon: [name], MD;  Location[facility] CATH LAB;  Service: Cardiovascular;;  . ULNAR TUNNEL RELEASE Left 04/07/2016   Dr. Ellene Route    Family History  Problem Relation Age of Onset  . Cancer Mother        intestinal   . Heart disease Mother   . Cancer Father        prostate  . Migraines Daughter   . Leukemia Sister   . Heart disease Sister   . Prostate cancer Other   . Kidney cancer Other   . Cancer Other        Bladder cancer  . Coronary artery disease Other   . Hypertension Sister   .  Hypertension Brother   . Heart attack Neg Hx   . Stroke Neg Hx     Social History   Socioeconomic History  . Marital status: Married    Spouse name: Not on file  . Number of children: Not on file  . Years of education: Not on file  . Highest education level: Not on file  Occupational History  . Occupation: Painter  Tobacco Use  . Smoking status: Former Smoker    Packs/day: 3.00    Years: 30.00    Pack years: 90.00    Types: Cigarettes    Quit date: 09/17/1996    Years since quitting: 23.2  . Smokeless tobacco: Never Used  Substance and Sexual Activity  . Alcohol use: No  . Drug use: No  . Sexual activity: Not on file  Other Topics Concern  . Not on file  Social History Narrative   Married and lives locally with his wife.  Sharyon Cable.   Social Determinants of Health   Financial Resource Strain:   . Difficulty of Paying Living Expenses: Not on file  Food Insecurity:   . Worried About Charity fundraiser in the Last Year: Not on file  . Ran Out of Food in the Last Year: Not on file  Transportation Needs: No Transportation Needs  . Lack of Transportation (Medical): No  . Lack of Transportation (Non-Medical): No    Physical Activity:   . Days of Exercise per Week: Not on file  . Minutes of Exercise per Session: Not on file  Stress:   . Feeling of Stress : Not on file  Social Connections:   . Frequency of Communication with Friends and Family: Not on file  . Frequency of Social Gatherings with Friends and Family: Not on file  . Attends Religious Services: Not on file  . Active Member of Clubs or Organizations: Not on file  . Attends Archivist Meetings: Not on file  . Marital Status: Not on file  Intimate Partner Violence:   . Fear of Current or Ex-Partner: Not on file  . Emotionally Abused: Not on file  . Physically Abused: Not on file  . Sexually Abused: Not on file    Outpatient Medications Prior to Visit  Medication Sig Dispense Refill  . acetaminophen (TYLENOL) 500 MG tablet Take 500 mg by mouth 2 (two) times daily.     Marland Kitchen albuterol (PROVENTIL HFA;VENTOLIN HFA) 108 (90 Base) MCG/ACT inhaler Inhale 2 puffs into the lungs every 6 (six) hours as needed for wheezing or shortness of breath. 1 Inhaler 0  . allopurinol (ZYLOPRIM) 300 MG tablet TAKE 1 TABLET EVERY DAY 90 tablet 1  . aspirin EC 81 MG tablet Take 81 mg by mouth daily.    Marland Kitchen atorvastatin (LIPITOR) 40 MG tablet TAKE 1 TABLET EVERY DAY 90 tablet 1  . B Complex Vitamins (B COMPLEX-B12 PO) Take 1 tablet by mouth once a week.     . Cholecalciferol (VITAMIN D3) 25 MCG (1000 UT) CAPS Take 1,000 Units by mouth at bedtime.    . fenofibrate 160 MG tablet TAKE 1 TABLET (160 MG TOTAL) BY MOUTH AT BEDTIME. 90 tablet 1  . fluocinonide cream (LIDEX) [id number] % Apply 1 application topically daily as needed (for rashes).     . fluticasone (FLONASE) 50 MCG/ACT nasal spray Place 2 sprays into both nostrils daily. 16 g 1  . furosemide (LASIX) 40 MG tablet TAKE 1 AND 1/2 TABLETS EVERY DAY 135 tablet 3  . HYDROcodone-acetaminophen (  NORCO) 10-325 MG tablet Take 0.5 tablets by mouth 2 (two) times daily.  0  . insulin NPH Human (NOVOLIN N) 100 UNIT/ML  injection Inject under skin 35 units in am and 25 in the evening 54 mL 1  . insulin regular (NOVOLIN R RELION) 100 units/mL injection Inject 15-20 units 3 times a day before meals. 60 mL 1  . Insulin Syringe-Needle U-100 (BD INSULIN SYRINGE U/F) 31G X 5/16" 0.3 ML MISC Use to inject insulin 5 times a day. 500 each 11  . isosorbide mononitrate (IMDUR) 30 MG 24 hr tablet Take 1 tablet (30 mg total) by mouth daily. 90 tablet 3  . MAGNESIUM PO Take 1 tablet by mouth at bedtime.    . metoprolol tartrate (LOPRESSOR) 50 MG tablet TAKE 1 TABLET TWICE DAILY (Patient taking differently: Take 25 mg by mouth 2 (two) times daily. ) 180 tablet 1  . metroNIDAZOLE (FLAGYL) 500 MG tablet Take 1 tablet (500 mg total) by mouth 3 (three) times daily. One po bid x 7 days (Patient not taking: Reported on 09/15/2019) 21 tablet 0  . nitroGLYCERIN (NITROSTAT) 0.4 MG SL tablet Place 1 tablet (0.4 mg total) under the tongue every 5 (five) minutes as needed for chest pain. 100 tablet 3  . ondansetron (ZOFRAN ODT) 8 MG disintegrating tablet 8mg  ODT q4 hours prn nausea 6 tablet 0  . pantoprazole (PROTONIX) 20 MG tablet Take 1 tablet (20 mg total) by mouth daily. 90 tablet 1  . Probiotic Product (PROBIOTIC PO) Take 1 capsule by mouth at bedtime.     Marland Kitchen tiZANidine (ZANAFLEX) 4 MG tablet Take 4 mg by mouth 2 (two) times daily.  6  . TRUE METRIX BLOOD GLUCOSE TEST test strip TEST BLOOD SUGAR THREE TIMES DAILY AS DIRECTED 300 strip 11  . TRUEplus Lancets 33G MISC Use to check blood sugar 3 times a day 300 each 11  . ciprofloxacin (CIPRO) 500 MG tablet Take 1 tablet (500 mg total) by mouth 2 (two) times daily. 20 tablet 0   No facility-administered medications prior to visit.    Allergies  Allergen Reactions  . Brilinta [Ticagrelor] Shortness Of Breath  . Levemir [Insulin Detemir] Swelling and Other (See Comments)    Patient had redness and swelling and tenderness at injection site.  . Bee Venom   . Codeine Other (See Comments)      Makes him "shakey", is OK with hydrocodone GI UPSET & TREMORS  . Lipitor [Atorvastatin] Other (See Comments)    Muscle aches; affects liver function- Patient is currently taking  . Novolog Mix [Insulin Aspart Prot & Aspart] Other (See Comments)    Causes skin to swell/itch at injection site    Review of Systems  Constitutional: Negative for fever and malaise/fatigue.  HENT: Negative for congestion.   Eyes: Negative for blurred vision.  Respiratory: Negative for shortness of breath.   Cardiovascular: Negative for chest pain, palpitations and leg swelling.  Gastrointestinal: Negative for abdominal pain, blood in stool and nausea.  Genitourinary: Negative for dysuria and frequency.  Musculoskeletal: Negative for falls.  Skin: Negative for rash.  Neurological: Negative for dizziness, loss of consciousness and headaches.  Endo/Heme/Allergies: Negative for environmental allergies.  Psychiatric/Behavioral: Negative for depression. The patient is not nervous/anxious.        Objective:    Physical Exam unable to obtain via phone visit  There were no vitals taken for this visit. Wt Readings from Last 3 Encounters:  09/15/19 256 lb (116.1 kg)  08/29/19 248  lb (112.5 kg)  08/18/19 238 lb 8 oz (108.2 kg)    Diabetic Foot Exam - Simple   No data filed     Lab Results  Component Value Date   WBC 5.9 11/21/2019   HGB 13.6 11/21/2019   HCT 40.6 11/21/2019   PLT 194.0 11/21/2019   GLUCOSE 177 (H) 11/21/2019   CHOL 113 11/21/2019   TRIG 250.0 (H) 11/21/2019   HDL 21.50 (L) 11/21/2019   LDLDIRECT 57.0 11/21/2019   LDLCALC 53 05/11/2019   ALT 23 11/21/2019   AST 22 11/21/2019   NA 140 11/21/2019   K 4.6 11/21/2019   CL 103 11/21/2019   CREATININE 1.54 (H) 11/21/2019   BUN 26 (H) 11/21/2019   CO2 31 11/21/2019   TSH 4.59 (H) 11/21/2019   PSA 1.01 11/02/2018   INR 1.1 02/24/2017   HGBA1C 8.0 (H) 11/21/2019   MICROALBUR 0.7 08/02/2014    Lab Results  Component Value  Date   TSH 4.59 (H) 11/21/2019   Lab Results  Component Value Date   WBC 5.9 11/21/2019   HGB 13.6 11/21/2019   HCT 40.6 11/21/2019   MCV 91.3 11/21/2019   PLT 194.0 11/21/2019   Lab Results  Component Value Date   NA 140 11/21/2019   K 4.6 11/21/2019   CO2 31 11/21/2019   GLUCOSE 177 (H) 11/21/2019   BUN 26 (H) 11/21/2019   CREATININE 1.54 (H) 11/21/2019   BILITOT 0.6 11/21/2019   ALKPHOS 61 11/21/2019   AST 22 11/21/2019   ALT 23 11/21/2019   PROT 7.0 11/21/2019   ALBUMIN 4.2 11/21/2019   CALCIUM 10.2 11/21/2019   ANIONGAP 12 04/22/2019   GFR 43.72 (L) 11/21/2019   Lab Results  Component Value Date   CHOL 113 11/21/2019   Lab Results  Component Value Date   HDL 21.50 (L) 11/21/2019   Lab Results  Component Value Date   LDLCALC 53 05/11/2019   Lab Results  Component Value Date   TRIG 250.0 (H) 11/21/2019   Lab Results  Component Value Date   CHOLHDL 5 11/21/2019   Lab Results  Component Value Date   HGBA1C 8.0 (H) 11/21/2019       Assessment & Plan:   Problem List Items Addressed This Visit    Gout - Primary    Hydrate, continue current treatment and monitor uric acid levels      Relevant Orders   Uric acid   Renal insufficiency    Hydrate and monitor      Hyperlipidemia    Encouraged heart healthy diet, increase exercise, avoid trans fats, consider a krill oil cap daily      Relevant Orders   Lipid panel   Essential hypertension    Well controlled, no changes to meds. Encouraged heart healthy diet such as the DASH diet and exercise as tolerated.       Relevant Orders   CBC   Comprehensive metabolic panel   TSH   Controlled diabetes mellitus type 2 with complications ([place])    Has been labile and somewhat elevated recently ranging from 91 to 222 over the past few weeks. She has been taking her N at 35 in am and 25 in pm. Her low numbers are always in the morning. Have recommended she increase her am dose to 37 units. She will maintain  her R at 20 units tid with meals. She has follow up with endocrinology soon. Increase activity as able and minimize simple carbohydrates  Relevant Orders   Hemoglobin A1c      I have discontinued Elyn Aquas. Rickerson "Bob"'s ciprofloxacin. I am also having him maintain his fluocinonide cream, aspirin EC, acetaminophen, B Complex Vitamins (B COMPLEX-B12 PO), albuterol, fluticasone, Probiotic Product (PROBIOTIC PO), nitroGLYCERIN, tiZANidine, Vitamin D3, MAGNESIUM PO, HYDROcodone-acetaminophen, isosorbide mononitrate, metroNIDAZOLE, ondansetron, pantoprazole, metoprolol tartrate, fenofibrate, allopurinol, atorvastatin, furosemide, True Metrix Blood Glucose Test, insulin NPH Human, insulin regular, TRUEplus Lancets 33G, and Insulin Syringe-Needle U-100.  No orders of the defined types were placed in this encounter.     I discussed the assessment and treatment plan with the patient. The patient was provided an opportunity to ask questions and all were answered. The patient agreed with the plan and demonstrated an understanding of the instructions.   The patient was advised to call back or seek an in-person evaluation if the symptoms worsen or if the condition fails to improve as anticipated.  I provided 25 minutes of non-face-to-face time during this encounter.   Penni Homans, MD

## 2019-11-26 NOTE — Assessment & Plan Note (Signed)
Encouraged heart healthy diet, increase exercise, avoid trans fats, consider a krill oil cap daily 

## 2019-12-02 DIAGNOSIS — G4733 Obstructive sleep apnea (adult) (pediatric): Secondary | ICD-10-CM | POA: Diagnosis not present

## 2019-12-04 ENCOUNTER — Other Ambulatory Visit: Payer: Self-pay | Admitting: Cardiology

## 2019-12-04 DIAGNOSIS — R072 Precordial pain: Secondary | ICD-10-CM

## 2019-12-05 NOTE — Progress Notes (Signed)
HPI: FU CAD. Pt has had previous PCI of his LAD. Patient had admission October 2017 with NSTEMI. He had PCI of the circumflex and RCA with drug-eluting stents. Cardiac catheterization repeated May 2018because of dyspnea and chest pain. There was continued patency of stent segments in his RCA, left circumflex and LAD. Left ventricular end-diastolic pressure moderately elevated(23). Admitted with CHF and chest pain December 2019. Enzymes negative. Echocardiogram December 2019 showed normal LV function and mild left atrial enlargement. Nuclear study December 2019 showed no ischemia or infarction. Abdominal CT July 2020 showed 3.2 cm abdominal aortic aneurysm.  Since last seen,he has increased dyspnea on exertion.  He denies orthopnea, PND or increased pedal edema.  No exertional chest pain or syncope.  Current Outpatient Medications  Medication Sig Dispense Refill  . acetaminophen (TYLENOL) 500 MG tablet Take 500 mg by mouth 2 (two) times daily.     Marland Kitchen allopurinol (ZYLOPRIM) 300 MG tablet TAKE 1 TABLET EVERY DAY 90 tablet 1  . aspirin EC 81 MG tablet Take 81 mg by mouth daily.    Marland Kitchen atorvastatin (LIPITOR) 40 MG tablet TAKE 1 TABLET EVERY DAY 90 tablet 1  . B Complex Vitamins (B COMPLEX-B12 PO) Take 1 tablet by mouth once a week.     . Cholecalciferol (VITAMIN D3) 25 MCG (1000 UT) CAPS Take 1,000 Units by mouth at bedtime.    . fenofibrate 160 MG tablet TAKE 1 TABLET (160 MG TOTAL) BY MOUTH AT BEDTIME. 90 tablet 1  . fluocinonide cream (LIDEX) [id number] % Apply 1 application topically daily as needed (for rashes).     . fluticasone (FLONASE) 50 MCG/ACT nasal spray Place 2 sprays into both nostrils daily. 16 g 1  . furosemide (LASIX) 40 MG tablet TAKE 1 AND 1/2 TABLETS EVERY DAY 135 tablet 3  . HYDROcodone-acetaminophen (NORCO) 10-325 MG tablet Take 0.5 tablets by mouth 2 (two) times daily.  0  . insulin NPH Human (NOVOLIN N) 100 UNIT/ML injection Inject under skin 35 units in am and 25 in the  evening 54 mL 1  . insulin regular (NOVOLIN R RELION) 100 units/mL injection Inject 15-20 units 3 times a day before meals. 60 mL 1  . Insulin Syringe-Needle U-100 (BD INSULIN SYRINGE U/F) 31G X 5/16" 0.3 ML MISC Use to inject insulin 5 times a day. 500 each 11  . isosorbide mononitrate (IMDUR) 30 MG 24 hr tablet TAKE 1 TABLET (30 MG TOTAL) BY MOUTH DAILY. 90 tablet 3  . MAGNESIUM PO Take 1 tablet by mouth at bedtime.    . metoprolol tartrate (LOPRESSOR) 50 MG tablet TAKE 1 TABLET TWICE DAILY (Patient taking differently: Take 25 mg by mouth 2 (two) times daily. ) 180 tablet 1  . nitroGLYCERIN (NITROSTAT) 0.4 MG SL tablet Place 1 tablet (0.4 mg total) under the tongue every 5 (five) minutes as needed for chest pain. 100 tablet 3  . pantoprazole (PROTONIX) 20 MG tablet Take 1 tablet (20 mg total) by mouth daily. 90 tablet 1  . Probiotic Product (PROBIOTIC PO) Take 1 capsule by mouth at bedtime.     Marland Kitchen tiZANidine (ZANAFLEX) 4 MG tablet Take 4 mg by mouth 2 (two) times daily.  6  . TRUE METRIX BLOOD GLUCOSE TEST test strip TEST BLOOD SUGAR THREE TIMES DAILY AS DIRECTED 300 strip 11  . TRUEplus Lancets 33G MISC Use to check blood sugar 3 times a day 300 each 11   No current facility-administered medications for this visit.  Past Medical History:  Diagnosis Date  . AAA (abdominal aortic aneurysm) ([place]) 12/15/2014   a. Mild aneurysmal dilatation of the infrarenal abdominal aorta 3.2 cm - f/u due by 2019.  . Arthritis    "all over" (07/30/2016)  . CAD (coronary artery disease)    a. stent to LAD 2000. b. possible spasm by cath 2001. c. IVUS/PTCA/DES to mLAD 05/2013. d. PTCA/DES of dRCA into ostial rPDA 07/2013. e. PTCA of OM2, DES to Cx, DES to [facility] 07/2016.  Marland Kitchen Chronic bronchitis ([place])   . Chronic diastolic CHF (congestive heart failure) ([place])   . Chronic lower back pain   . Chronic neck pain   . CKD (chronic kidney disease), stage III   . Diabetic peripheral neuropathy ([place])   . Ejection  fraction    55%, 07/2010, mild inferior hypo  . GERD (gastroesophageal reflux disease)   . Gout   . H/O diverticulitis of colon 01/31/2015  . H/O hiatal hernia   . History of blood transfusion ~ 10/1940   "had pneumonia"  . History of kidney stones   . HTN (hypertension)   . Hyperlipidemia   . Melanoma of forearm, left ([place]) 02/2011   with wide excision   . Myocardial infarction ([place])   . Nephrolithiasis   . Obesity   . OSA on CPAP     [name] since 2000  . Peripheral neuropathy    both feet  . Positive D-dimer    a. significant elevation ,hospital 07/2010, etiology unclear. b. D Dimer chronically > 20.  Marland Kitchen Renal insufficiency   . Skin cancer   . Spinal stenosis    a. s/p surgical repair 2013  . Spinal stenosis of lumbar region   . Type II diabetes mellitus ([place])   . Ventral hernia     Past Surgical History:  Procedure Laterality Date  . ANTERIOR LAT LUMBAR FUSION  10/22/2017   Procedure: Lumbar two-three Lumbar three-four Lumbar four-five Anteriolateral lumbar interbody fusion with percutaneous pedicle screw fixation and infuse;  Surgeon: [name], MD;  Location: [facility];  Service: Neurosurgery;;  . APPLICATION OF ROBOTIC ASSISTANCE FOR SPINAL PROCEDURE  10/22/2017   Procedure: APPLICATION OF ROBOTIC ASSISTANCE FOR SPINAL PROCEDURE;  Surgeon: [name], MD;  Location: [facility];  Service: Neurosurgery;;  . BACK SURGERY    . CARDIAC CATHETERIZATION N/A 07/30/2016   Procedure: Left Heart Cath and Coronary Angiography;  Surgeon: Nelva Bush, MD;  Location: [facility] CV LAB;  Service: Cardiovascular;  Laterality: N/A;  . CARDIAC CATHETERIZATION N/A 07/30/2016   Procedure: Coronary Stent Intervention;  Surgeon: Nelva Bush, MD;  Location: [facility] CV LAB;  Service: Cardiovascular;  Laterality: N/A;  Mid CFX and MID RCA  . CARDIAC CATHETERIZATION N/A 07/30/2016   Procedure: Coronary Balloon Angioplasty;  Surgeon: Nelva Bush, MD;  Location: [facility] LAB;   Service: Cardiovascular;  Laterality: N/A;  OM 1  . CARPAL TUNNEL RELEASE Left   . CATARACT EXTRACTION W/ INTRAOCULAR LENS  IMPLANT, BILATERAL Bilateral ~ 2014  . CERVICAL DISC SURGERY  1990s  . CORONARY ANGIOPLASTY WITH STENT PLACEMENT  05/2013  . CORONARY ANGIOPLASTY WITH STENT PLACEMENT  07/26/2013   DES to RCA extending to PDA    . CORONARY ANGIOPLASTY WITH STENT PLACEMENT  07/30/2016  . CORONARY ANGIOPLASTY WITH STENT PLACEMENT  2000   CAD  . DENTAL SURGERY  04/2016   "got infected; had to dig it out"  . EYE SURGERY    . KNEE ARTHROSCOPY Right 1990's  rt  . LEFT AND RIGHT HEART CATHETERIZATION WITH CORONARY ANGIOGRAM N/A 07/26/2013   Procedure: LEFT AND RIGHT HEART CATHETERIZATION WITH CORONARY ANGIOGRAM;  Surgeon: [name], MD;  Location: [facility] CATH LAB;  Service: Cardiovascular;  Laterality: N/A;  . LEFT HEART CATH AND CORONARY ANGIOGRAPHY N/A 02/26/2017   Procedure: Left Heart Cath and Coronary Angiography;  Surgeon: Sherren Mocha, MD;  Location: [facility] CV LAB;  Service: Cardiovascular;  Laterality: N/A;  . LEFT HEART CATHETERIZATION WITH CORONARY ANGIOGRAM N/A 05/19/2013   Procedure: LEFT HEART CATHETERIZATION WITH CORONARY ANGIOGRAM;  Surgeon: [name], MD;  Location: [facility] CATH LAB;  Service: Cardiovascular;  Laterality: N/A;  . LUMBAR LAMINECTOMY/DECOMPRESSION MICRODISCECTOMY  08/30/2012   Procedure: LUMBAR LAMINECTOMY/DECOMPRESSION MICRODISCECTOMY 2 LEVELS;  Surgeon: [name], MD;  Location: [place] [facility];  Service: Neurosurgery;  Laterality: Bilateral;  Bilateral Lumbar three-four Lumbar four-five Laminotomies  . LUMBAR PERCUTANEOUS PEDICLE SCREW 1 LEVEL Bilateral 10/22/2017   Procedure: LUMBAR PERCUTANEOUS PEDICLE SCREW 1 LEVEL;  Surgeon: [name], MD;  Location: [facility];  Service: Neurosurgery;  Laterality: Bilateral;  . LUMBAR SPINE SURGERY  04/07/2016   Dr. Ellene Route; "?ruptured disc"  . MELANOMA EXCISION Left 02/2011   forearm  . NASAL SINUS SURGERY   1970s   "cut windows in sinus pockets"  . PERCUTANEOUS CORONARY STENT INTERVENTION (PCI-S)  05/19/2013   Procedure: PERCUTANEOUS CORONARY STENT INTERVENTION (PCI-S);  Surgeon: [name], MD;  Location: [facility] CATH LAB;  Service: Cardiovascular;;  . ULNAR TUNNEL RELEASE Left 04/07/2016   Dr. Ellene Route    Social History   Socioeconomic History  . Marital status: Married    Spouse name: Not on file  . Number of children: Not on file  . Years of education: Not on file  . Highest education level: Not on file  Occupational History  . Occupation: Painter  Tobacco Use  . Smoking status: Former Smoker    Packs/day: 3.00    Years: 30.00    Pack years: 90.00    Types: Cigarettes    Quit date: 09/17/1996    Years since quitting: 23.2  . Smokeless tobacco: Never Used  Substance and Sexual Activity  . Alcohol use: No  . Drug use: No  . Sexual activity: Not on file  Other Topics Concern  . Not on file  Social History Narrative   Married and lives locally with his wife.  Sharyon Cable.   Social Determinants of Health   Financial Resource Strain:   . Difficulty of Paying Living Expenses: Not on file  Food Insecurity:   . Worried About Charity fundraiser in the Last Year: Not on file  . Ran Out of Food in the Last Year: Not on file  Transportation Needs: No Transportation Needs  . Lack of Transportation (Medical): No  . Lack of Transportation (Non-Medical): No  Physical Activity:   . Days of Exercise per Week: Not on file  . Minutes of Exercise per Session: Not on file  Stress:   . Feeling of Stress : Not on file  Social Connections:   . Frequency of Communication with Friends and Family: Not on file  . Frequency of Social Gatherings with Friends and Family: Not on file  . Attends Religious Services: Not on file  . Active Member of Clubs or Organizations: Not on file  . Attends Archivist Meetings: Not on file  . Marital Status: Not on file  Intimate Partner Violence:   .  Fear of Current or Ex-Partner:  Not on file  . Emotionally Abused: Not on file  . Physically Abused: Not on file  . Sexually Abused: Not on file    Family History  Problem Relation Age of Onset  . Cancer Mother        intestinal   . Heart disease Mother   . Cancer Father        prostate  . Migraines Daughter   . Leukemia Sister   . Heart disease Sister   . Prostate cancer Other   . Kidney cancer Other   . Cancer Other        Bladder cancer  . Coronary artery disease Other   . Hypertension Sister   . Hypertension Brother   . Heart attack Neg Hx   . Stroke Neg Hx     ROS: no fevers or chills, productive cough, hemoptysis, dysphasia, odynophagia, melena, hematochezia, dysuria, hematuria, rash, seizure activity, orthopnea, PND, pedal edema, claudication. Remaining systems are negative.  Physical Exam: Well-developed well-nourished in no acute distress.  Skin is warm and dry.  HEENT is normal.  Neck is supple.  Chest is clear to auscultation with normal expansion.  Cardiovascular exam is regular rate and rhythm.  Abdominal exam nontender or distended. No masses palpated. Extremities show no edema. neuro grossly intact  ECG-sinus rhythm, left anterior fascicular block, nonspecific ST changes.  Personally reviewed  A/P  1 coronary artery disease-patient doing well with no recurrent chest pain.  Continue medical therapy with aspirin and statin.  Increase Imdur to 30 mg daily.  2 Hypertension-patient's blood pressure is controlled today.  Continue present medical regimen.  3 abdominal aortic aneurysm-plan follow-up ultrasound July 2021.  4 hyperlipidemia-continue statin.  5 chronic diastolic congestive heart failure-patient complains of increased dyspnea.  Increase Lasix to 80 mg daily.  Check potassium, renal function and BNP in 1 week.  Kirk Ruths, MD

## 2019-12-06 ENCOUNTER — Other Ambulatory Visit: Payer: Self-pay

## 2019-12-06 ENCOUNTER — Encounter: Payer: Self-pay | Admitting: Cardiology

## 2019-12-06 ENCOUNTER — Ambulatory Visit: Payer: Medicare HMO | Admitting: Cardiology

## 2019-12-06 VITALS — BP 120/62 | HR 77 | Ht 70.5 in | Wt 249.4 lb

## 2019-12-06 DIAGNOSIS — I714 Abdominal aortic aneurysm, without rupture, unspecified: Secondary | ICD-10-CM

## 2019-12-06 DIAGNOSIS — R0600 Dyspnea, unspecified: Secondary | ICD-10-CM | POA: Diagnosis not present

## 2019-12-06 DIAGNOSIS — E7800 Pure hypercholesterolemia, unspecified: Secondary | ICD-10-CM

## 2019-12-06 DIAGNOSIS — I10 Essential (primary) hypertension: Secondary | ICD-10-CM

## 2019-12-06 DIAGNOSIS — I2510 Atherosclerotic heart disease of native coronary artery without angina pectoris: Secondary | ICD-10-CM | POA: Diagnosis not present

## 2019-12-06 DIAGNOSIS — R0609 Other forms of dyspnea: Secondary | ICD-10-CM

## 2019-12-06 MED ORDER — FUROSEMIDE 40 MG PO TABS: 80.0000 mg | ORAL_TABLET | Freq: Every day | ORAL | 3 refills | Status: DC

## 2019-12-06 NOTE — Patient Instructions (Signed)
Medication Instructions:  INCREASE ISOSORBIDE TO 30 MG ONCE DAILY= 1 WHOLE TABLET ONCE DAILY  INCREASE FUROSEMIDE TO 80 MG ONCE DAILY= 2 OF THE 40 MG TABLETS ONCE DAILY  *If you need a refill on your cardiac medications before your next appointment, please call your pharmacy*  Lab Work: Your physician recommends that you return for lab work in: [place]  If you have labs (blood work) drawn today and your tests are completely normal, you will receive your results only by: Marland Kitchen MyChart Message (if you have MyChart) OR . A paper copy in the mail If you have any lab test that is abnormal or we need to change your treatment, we will call you to review the results.  Follow-Up: At [facility], you and your health needs are our priority.  As part of our continuing mission to provide you with exceptional heart care, we have created designated Provider Care Teams.  These Care Teams include your primary Cardiologist (physician) and Advanced Practice Providers (APPs -  Physician Assistants and Nurse Practitioners) who all work together to provide you with the care you need, when you need it.  Your next appointment:   3 month(s)  The format for your next appointment:   In Person  Provider:   Kirk Ruths, MD

## 2019-12-07 ENCOUNTER — Other Ambulatory Visit: Payer: Self-pay | Admitting: Family Medicine

## 2019-12-08 DIAGNOSIS — M4802 Spinal stenosis, cervical region: Secondary | ICD-10-CM | POA: Diagnosis not present

## 2019-12-08 DIAGNOSIS — Z6835 Body mass index (BMI) 35.0-35.9, adult: Secondary | ICD-10-CM | POA: Diagnosis not present

## 2019-12-08 DIAGNOSIS — I10 Essential (primary) hypertension: Secondary | ICD-10-CM | POA: Diagnosis not present

## 2019-12-13 DIAGNOSIS — R0600 Dyspnea, unspecified: Secondary | ICD-10-CM | POA: Diagnosis not present

## 2019-12-14 LAB — BASIC METABOLIC PANEL
BUN/Creatinine Ratio: 15 (ref 10–24)
BUN: 23 mg/dL (ref 8–27)
CO2: 25 mmol/L (ref 20–29)
Calcium: 9.9 mg/dL (ref 8.6–10.2)
Chloride: 103 mmol/L (ref 96–106)
Creatinine, Ser: 1.54 mg/dL — ABNORMAL HIGH (ref 0.76–1.27)
GFR calc Af Amer: 49 mL/min/{1.73_m2} — ABNORMAL LOW (ref >59)
GFR calc non Af Amer: 42 mL/min/{1.73_m2} — ABNORMAL LOW (ref >59)
Glucose: 206 mg/dL — ABNORMAL HIGH (ref 65–99)
Potassium: 3.9 mmol/L (ref 3.5–5.2)
Sodium: 145 mmol/L — ABNORMAL HIGH (ref 134–144)

## 2019-12-14 LAB — PRO B NATRIURETIC PEPTIDE: NT-Pro BNP: 131 pg/mL (ref 0–486)

## 2019-12-15 ENCOUNTER — Encounter: Payer: Self-pay | Admitting: *Deleted

## 2019-12-18 ENCOUNTER — Other Ambulatory Visit (HOSPITAL_BASED_OUTPATIENT_CLINIC_OR_DEPARTMENT_OTHER): Payer: Self-pay | Admitting: Neurological Surgery

## 2019-12-18 DIAGNOSIS — M4802 Spinal stenosis, cervical region: Secondary | ICD-10-CM

## 2019-12-22 ENCOUNTER — Other Ambulatory Visit: Payer: Self-pay | Admitting: Family Medicine

## 2019-12-23 ENCOUNTER — Ambulatory Visit (HOSPITAL_BASED_OUTPATIENT_CLINIC_OR_DEPARTMENT_OTHER)
Admission: RE | Admit: 2019-12-23 | Discharge: 2019-12-23 | Disposition: A | Discharge status: Home / Self Care | Payer: Medicare HMO | Source: Ambulatory Visit | Attending: Neurological Surgery | Admitting: Neurological Surgery

## 2019-12-23 ENCOUNTER — Other Ambulatory Visit: Payer: Self-pay

## 2019-12-23 DIAGNOSIS — M4802 Spinal stenosis, cervical region: Secondary | ICD-10-CM | POA: Diagnosis not present

## 2019-12-23 DIAGNOSIS — M50221 Other cervical disc displacement at C4-C5 level: Secondary | ICD-10-CM | POA: Diagnosis not present

## 2019-12-26 ENCOUNTER — Other Ambulatory Visit: Payer: Self-pay

## 2019-12-28 ENCOUNTER — Other Ambulatory Visit: Payer: Self-pay

## 2019-12-28 ENCOUNTER — Encounter: Payer: Self-pay | Admitting: Internal Medicine

## 2019-12-28 ENCOUNTER — Ambulatory Visit: Payer: Medicare HMO | Admitting: Internal Medicine

## 2019-12-28 VITALS — BP 118/60 | HR 76 | Ht 70.5 in | Wt 247.0 lb

## 2019-12-28 DIAGNOSIS — E1159 Type 2 diabetes mellitus with other circulatory complications: Secondary | ICD-10-CM

## 2019-12-28 DIAGNOSIS — Z794 Long term (current) use of insulin: Secondary | ICD-10-CM | POA: Diagnosis not present

## 2019-12-28 DIAGNOSIS — Z6837 Body mass index (BMI) 37.0-37.9, adult: Secondary | ICD-10-CM

## 2019-12-28 DIAGNOSIS — E785 Hyperlipidemia, unspecified: Secondary | ICD-10-CM

## 2019-12-28 MED ORDER — INSULIN REGULAR HUMAN 100 UNIT/ML IJ SOLN: INTRAMUSCULAR | 1 refills | Status: DC

## 2019-12-28 MED ORDER — INSULIN NPH (HUMAN) (ISOPHANE) 100 UNIT/ML ~~LOC~~ SUSP: SUBCUTANEOUS | 1 refills | Status: DC

## 2019-12-28 NOTE — Progress Notes (Signed)
Patient ID: William Hendrix, male   DOB: [birth year], 79 y.o.   MRN: [id number]  This visit occurred during the SARS-CoV-2 public health emergency.  Safety protocols were in place, including screening questions prior to the visit, additional usage of staff PPE, and extensive cleaning of exam room while observing appropriate contact time as indicated for disinfecting solutions.   HPI: William Hendrix is a 79 y.o.-year-old male, returning for f/u for DM2, dx 2009, insulin-dependent, uncontrolled, with complications (CAD s/p AMI 2017, CKD stage 3). Last visit 4 months ago.  He has more neck pain after recent fall.  He had an MRI and will see neurosurgery tomorrow.  His sugars are higher.  In 2018, he eliminated meat almost completely and his sugars improved significantly while he also lost weight.  He continues to eat a mostly plant-based diet.  Reviewed HbA1c levels: Lab Results  Component Value Date   HGBA1C 8.0 (H) 11/21/2019   HGBA1C 7.2 (H) 08/14/2019   HGBA1C 6.7 (H) 05/11/2019   He is on: ReliOn insulin: Please continue: Insulin Before breakfast Before lunch Before dinner  Regular 15-20 15-20 15-20 (if sugars <100, take 10 units)  NPH 30 >> 35  20 >> 25 >> 35  If you take dinnertime insulin after dinner, take 50% of the dose. We cannot use Metformin 2/2 CKD. We stopped Januvia >> could not afford ($100/3 mo)  We cannot use Levemir >> developed a severe skin reaction We stopped Amaryl 4 mg.  He checks sugars 3 times a day per review of his log: - am: 70-150 >> 88, 98-180, 191, 212 >> 148, 167-251, 305, 311 - Before lunch: 51, 104-136, 167 >> 100-130 >> 119-176, 193 >> 163-265 - before dinner: 61, 71-169 >> 100-140 >> 95,103-210 >> 111, 195-326 - Before bedtime: 57, 120-145 >> 180-201 >> n/c Lowest sugar was   51 >> 70 >> 88 >> 111; it is unclear at which level he has hypoglycemia awareness Highest sugar was  212 >> 326.  Meals: - Breakfast: oatmeal + toast + tomatoes -  Lunch: beans  - Dinner:home cooked veggies   + CKD, last BUN/creatinine was:  Lab Results  Component Value Date   BUN 23 12/13/2019   CREATININE 1.54 (H) 12/13/2019   ACR levels were normal: Lab Results  Component Value Date   MICRALBCREAT 5.1 08/02/2014   MICRALBCREAT 0.7 02/23/2013   Reviewed EGFR levels: Lab Results  Component Value Date   GFRAA 49 (L) 12/13/2019   GFRAA 48 (L) 04/22/2019   GFRAA 44 (L) 12/29/2018   GFRAA 39 (L) 11/12/2018   GFRAA 32 (L) 11/11/2018   GFRAA 44 (L) 09/16/2018   GFRAA 43 (L) 09/15/2018   GFRAA 46 (L) 08/21/2018   GFRAA 45 (L) 07/25/2018   GFRAA 39 (L) 04/23/2018   + HL; latest lipids: Lab Results  Component Value Date   CHOL 113 11/21/2019   HDL 21.50 (L) 11/21/2019   LDLCALC 53 05/11/2019   LDLDIRECT 57.0 11/21/2019   TRIG 250.0 (H) 11/21/2019   CHOLHDL 5 11/21/2019  He is on Lipitor, no fibrate, flaxseed oil.  Pt's last eye exam was  11/2019: No DR. Had cataract sx in 2011.  No numbness and tingling in his legs.  He had back surgery in 03/2016 and 10/2017.  He developed a kidney stone after his last surgery. He was admitted 2x in 2017 for: CP/SOB >> AMI.  He had 2 stents placed then. He had a cardiac cath 02/2017 >> no  new blockages He was seen emergency room with colitis 04/22/2019.    PMH: Pt also also has a history of CAD, HTN, CKD stage III, history of nephrolithiasis, hiatal hernia, OSA, obesity, melanoma, spinal stenosis.  ROS: Constitutional: no weight gain/+ weight loss, no fatigue, no subjective hyperthermia, no subjective hypothermia Eyes: no blurry vision, no xerophthalmia ENT: no sore throat, no nodules palpated in neck, no dysphagia, no odynophagia, no hoarseness Cardiovascular: no CP/no SOB/no palpitations/no leg swelling Respiratory: no cough/no SOB/no wheezing Gastrointestinal: no N/no V/no D/no C/no acid reflux Musculoskeletal: no muscle aches/+ joint aches Skin: no rashes, no hair loss Neurological: no  tremors/no numbness/no tingling/no dizziness  I reviewed pt's medications, allergies, PMH, social hx, family hx, and changes were documented in the history of present illness. Otherwise, unchanged from my initial visit note.  Past Medical History:  Diagnosis Date  . AAA (abdominal aortic aneurysm) ([place]) 12/15/2014   a. Mild aneurysmal dilatation of the infrarenal abdominal aorta 3.2 cm - f/u due by 2019.  . Arthritis    "all over" (07/30/2016)  . CAD (coronary artery disease)    a. stent to LAD 2000. b. possible spasm by cath 2001. c. IVUS/PTCA/DES to mLAD 05/2013. d. PTCA/DES of dRCA into ostial rPDA 07/2013. e. PTCA of OM2, DES to Cx, DES to [facility] 07/2016.  Marland Kitchen Chronic bronchitis ([place])   . Chronic diastolic CHF (congestive heart failure) ([place])   . Chronic lower back pain   . Chronic neck pain   . CKD (chronic kidney disease), stage III   . Diabetic peripheral neuropathy ([place])   . Ejection fraction    55%, 07/2010, mild inferior hypo  . GERD (gastroesophageal reflux disease)   . Gout   . H/O diverticulitis of colon 01/31/2015  . H/O hiatal hernia   . History of blood transfusion ~ 10/1940   "had pneumonia"  . History of kidney stones   . HTN (hypertension)   . Hyperlipidemia   . Melanoma of forearm, left ([place]) 02/2011   with wide excision   . Myocardial infarction ([place])   . Nephrolithiasis   . Obesity   . OSA on CPAP     [name] since 2000  . Peripheral neuropathy    both feet  . Positive D-dimer    a. significant elevation ,hospital 07/2010, etiology unclear. b. D Dimer chronically > 20.  Marland Kitchen Renal insufficiency   . Skin cancer   . Spinal stenosis    a. s/p surgical repair 2013  . Spinal stenosis of lumbar region   . Type II diabetes mellitus ([place])   . Ventral hernia    Past Surgical History:  Procedure Laterality Date  . ANTERIOR LAT LUMBAR FUSION  10/22/2017   Procedure: Lumbar two-three Lumbar three-four Lumbar four-five Anteriolateral lumbar interbody fusion with  percutaneous pedicle screw fixation and infuse;  Surgeon: [name], MD;  Location: [place];  Service: Neurosurgery;;  . APPLICATION OF ROBOTIC ASSISTANCE FOR SPINAL PROCEDURE  10/22/2017   Procedure: APPLICATION OF ROBOTIC ASSISTANCE FOR SPINAL PROCEDURE;  Surgeon: [name], MD;  Location: [place];  Service: Neurosurgery;;  . BACK SURGERY    . CARDIAC CATHETERIZATION N/A 07/30/2016   Procedure: Left Heart Cath and Coronary Angiography;  Surgeon: Nelva Bush, MD;  Location: [place] CV LAB;  Service: Cardiovascular;  Laterality: N/A;  . CARDIAC CATHETERIZATION N/A 07/30/2016   Procedure: Coronary Stent Intervention;  Surgeon: Nelva Bush, MD;  Location: [place] CV LAB;  Service: Cardiovascular;  Laterality: N/A;  Mid  CFX and MID RCA  . CARDIAC CATHETERIZATION N/A 07/30/2016   Procedure: Coronary Balloon Angioplasty;  Surgeon: Nelva Bush, MD;  Location: [place] CV LAB;  Service: Cardiovascular;  Laterality: N/A;  OM 1  . CARPAL TUNNEL RELEASE Left   . CATARACT EXTRACTION W/ INTRAOCULAR LENS  IMPLANT, BILATERAL Bilateral ~ 2014  . CERVICAL DISC SURGERY  1990s  . CORONARY ANGIOPLASTY WITH STENT PLACEMENT  05/2013  . CORONARY ANGIOPLASTY WITH STENT PLACEMENT  07/26/2013   DES to RCA extending to PDA    . CORONARY ANGIOPLASTY WITH STENT PLACEMENT  07/30/2016  . CORONARY ANGIOPLASTY WITH STENT PLACEMENT  2000   CAD  . DENTAL SURGERY  04/2016   "got infected; had to dig it out"  . EYE SURGERY    . KNEE ARTHROSCOPY Right 1990's   rt  . LEFT AND RIGHT HEART CATHETERIZATION WITH CORONARY ANGIOGRAM N/A 07/26/2013   Procedure: LEFT AND RIGHT HEART CATHETERIZATION WITH CORONARY ANGIOGRAM;  Surgeon: [name], MD;  Location: [facility] CATH LAB;  Service: Cardiovascular;  Laterality: N/A;  . LEFT HEART CATH AND CORONARY ANGIOGRAPHY N/A 02/26/2017   Procedure: Left Heart Cath and Coronary Angiography;  Surgeon: Sherren Mocha, MD;  Location: [place] CV LAB;  Service:  Cardiovascular;  Laterality: N/A;  . LEFT HEART CATHETERIZATION WITH CORONARY ANGIOGRAM N/A 05/19/2013   Procedure: LEFT HEART CATHETERIZATION WITH CORONARY ANGIOGRAM;  Surgeon: [name], MD;  Location[facility] CATH LAB;  Service: Cardiovascular;  Laterality: N/A;  . LUMBAR LAMINECTOMY/DECOMPRESSION MICRODISCECTOMY  08/30/2012   Procedure: LUMBAR LAMINECTOMY/DECOMPRESSION MICRODISCECTOMY 2 LEVELS;  Surgeon: [name], MD;  Location: [facility] NEURO ORS;  Service: Neurosurgery;  Laterality: Bilateral;  Bilateral Lumbar three-four Lumbar four-five Laminotomies  . LUMBAR PERCUTANEOUS PEDICLE SCREW 1 LEVEL Bilateral 10/22/2017   Procedure: LUMBAR PERCUTANEOUS PEDICLE SCREW 1 LEVEL;  Surgeon: [name], MD;  Location: [facility];  Service: Neurosurgery;  Laterality: Bilateral;  . LUMBAR SPINE SURGERY  04/07/2016   Dr. Ellene Route; "?ruptured disc"  . MELANOMA EXCISION Left 02/2011   forearm  . NASAL SINUS SURGERY  1970s   "cut windows in sinus pockets"  . PERCUTANEOUS CORONARY STENT INTERVENTION (PCI-S)  05/19/2013   Procedure: PERCUTANEOUS CORONARY STENT INTERVENTION (PCI-S);  Surgeon: [name], MD;  Location: [facility] CATH LAB;  Service: Cardiovascular;;  . ULNAR TUNNEL RELEASE Left 04/07/2016   Dr. Ellene Route   Social History   Socioeconomic History  . Marital status: Married    Spouse name: Not on file  . Number of children: Not on file  . Years of education: Not on file  . Highest education level: Not on file  Occupational History  . Occupation: Painter  Tobacco Use  . Smoking status: Former Smoker    Packs/day: 3.00    Years: 30.00    Pack years: 90.00    Types: Cigarettes    Quit date: 09/17/1996    Years since quitting: 23.2  . Smokeless tobacco: Never Used  Substance and Sexual Activity  . Alcohol use: No  . Drug use: No  . Sexual activity: Not on file  Other Topics Concern  . Not on file  Social History Narrative   Married and lives locally with his wife.  Sharyon Cable.   Social  Determinants of Health   Financial Resource Strain:   . Difficulty of Paying Living Expenses:   Food Insecurity:   . Worried About Charity fundraiser in the Last Year:   . [place] in the Last Year:  Transportation Needs: No Transportation Needs  . Lack of Transportation (Medical): No  . Lack of Transportation (Non-Medical): No  Physical Activity:   . Days of Exercise per Week:   . Minutes of Exercise per Session:   Stress:   . Feeling of Stress :   Social Connections:   . Frequency of Communication with Friends and Family:   . Frequency of Social Gatherings with Friends and Family:   . Attends Religious Services:   . Active Member of Clubs or Organizations:   . Attends Archivist Meetings:   Marland Kitchen Marital Status:   Intimate Partner Violence:   . Fear of Current or Ex-Partner:   . Emotionally Abused:   Marland Kitchen Physically Abused:   . Sexually Abused:    Current Outpatient Medications on File Prior to Visit  Medication Sig Dispense Refill  . acetaminophen (TYLENOL) 500 MG tablet Take 500 mg by mouth 2 (two) times daily.     Marland Kitchen allopurinol (ZYLOPRIM) 300 MG tablet TAKE 1 TABLET EVERY DAY 90 tablet 1  . aspirin EC 81 MG tablet Take 81 mg by mouth daily.    Marland Kitchen atorvastatin (LIPITOR) 40 MG tablet TAKE 1 TABLET EVERY DAY 90 tablet 1  . B Complex Vitamins (B COMPLEX-B12 PO) Take 1 tablet by mouth once a week.     . Cholecalciferol (VITAMIN D3) 25 MCG (1000 UT) CAPS Take 1,000 Units by mouth at bedtime.    . fenofibrate 160 MG tablet TAKE 1 TABLET (160 MG TOTAL) BY MOUTH AT BEDTIME. 90 tablet 1  . fluocinonide cream (LIDEX) 9.21 % Apply 1 application topically daily as needed (for rashes).     . fluticasone (FLONASE) 50 MCG/ACT nasal spray Place 2 sprays into both nostrils daily. 16 g 1  . furosemide (LASIX) 40 MG tablet Take 2 tablets (80 mg total) by mouth daily. 180 tablet 3  . HYDROcodone-acetaminophen (NORCO) 10-325 MG tablet Take 0.5 tablets by mouth 2 (two) times daily.   0  . insulin NPH Human (NOVOLIN N) 100 UNIT/ML injection Inject under skin 35 units in am and 25 in the evening 54 mL 1  . insulin regular (NOVOLIN R RELION) 100 units/mL injection Inject 15-20 units 3 times a day before meals. 60 mL 1  . Insulin Syringe-Needle U-100 (BD INSULIN SYRINGE U/F) 31G X 5/16" 0.3 ML MISC Use to inject insulin 5 times a day. 500 each 11  . isosorbide mononitrate (IMDUR) 30 MG 24 hr tablet TAKE 1 TABLET (30 MG TOTAL) BY MOUTH DAILY. 90 tablet 3  . MAGNESIUM PO Take 1 tablet by mouth at bedtime.    . metoprolol tartrate (LOPRESSOR) 50 MG tablet TAKE 1 TABLET TWICE DAILY 180 tablet 1  . nitroGLYCERIN (NITROSTAT) 0.4 MG SL tablet Place 1 tablet (0.4 mg total) under the tongue every 5 (five) minutes as needed for chest pain. 100 tablet 3  . pantoprazole (PROTONIX) 20 MG tablet TAKE 1 TABLET (20 MG TOTAL) BY MOUTH DAILY. 90 tablet 1  . Probiotic Product (PROBIOTIC PO) Take 1 capsule by mouth at bedtime.     Marland Kitchen tiZANidine (ZANAFLEX) 4 MG tablet Take 4 mg by mouth 2 (two) times daily.  6  . TRUE METRIX BLOOD GLUCOSE TEST test strip TEST BLOOD SUGAR THREE TIMES DAILY AS DIRECTED 300 strip 11  . TRUEplus Lancets 33G MISC Use to check blood sugar 3 times a day 300 each 11   No current facility-administered medications on file prior to visit.   Allergies  Allergen Reactions  . Brilinta [Ticagrelor] Shortness Of Breath  . Insulin Detemir Swelling, Other (See Comments) and Itching    Patient had redness and swelling and tenderness at injection site. Patient had redness and swelling and tenderness at injection site.  . Bee Venom   . Codeine Other (See Comments)    Makes him "shakey", is OK with hydrocodone GI UPSET & TREMORS Makes him "shakey", is OK with hydrocodone Makes him "shakey", is OK with hydrocodone GI UPSET & TREMORS  . Lipitor [Atorvastatin] Other (See Comments)    Muscle aches; affects liver function- Patient is currently taking  . Novolog Mix [Insulin Aspart  Prot & Aspart] Other (See Comments)    Causes skin to swell/itch at injection site   Family History  Problem Relation Age of Onset  . Cancer Mother        intestinal   . Heart disease Mother   . Cancer Father        prostate  . Migraines Daughter   . Leukemia Sister   . Heart disease Sister   . Prostate cancer Other   . Kidney cancer Other   . Cancer Other        Bladder cancer  . Coronary artery disease Other   . Hypertension Sister   . Hypertension Brother   . Heart attack Neg Hx   . Stroke Neg Hx     PE: BP 118/60   Pulse 76   Ht 5' 10.5" (1.791 m)   Wt 247 lb (112 kg)   SpO2 96%   BMI 34.94 kg/m  Body mass index is 34.94 kg/m. Wt Readings from Last 3 Encounters:  12/28/19 247 lb (112 kg)  12/06/19 249 lb 6.4 oz (113.1 kg)  09/15/19 256 lb (116.1 kg)   Constitutional: overweight, in NAD Eyes: PERRLA, EOMI, no exophthalmos ENT: moist mucous membranes, no thyromegaly, no cervical lymphadenopathy Cardiovascular: RRR, No MRG Respiratory: CTA B Gastrointestinal: abdomen soft, NT, ND, BS+ Musculoskeletal: no deformities, strength intact in all 4 Skin: moist, warm, no rashes Neurological: + tremor with outstretched hands, DTR normal in all 4  ASSESSMENT: 1. DM2, insulin-dependent, uncontrolled, with complications - CAD, s/p stents - He had stenting of distal RCA/PDA in 07/2013. Prior stenting of LAD in 04/2013. 2 new stents: PCI to left circumflex and the mid RCA on 07/30/2016. - AAA - CKD stage 3  2. HL  3.  Obesity class II BMI Classification:  < 18.5 underweight   18.5-24.9 normal weight   25.0-29.9 overweight   30.0-34.9 class I obesity   35.0-39.9 class II obesity   ? 40.0 class III obesity   PLAN:  1. Patient with history of uncontrolled type 2 diabetes, on basal/bolus insulin regimen with NPH and regular insulin due to price.  He had significant improvement in his sugars after he changed to a mostly plant-based diet, however, he did relax his  diet afterwards.  At last visit, sugars are higher than we had to increase his NPH doses.  Despite this, latest HbA1c was higher last month, at 8.0%, increased from 7.2%. -At this visit, sugars are higher especially with his neck pain but they were higher after the holidays, also.  We discussed that for now, I would suggest to increase both NPH and regular insulin doses but I am hoping that this is a transient measure and we would be able to decrease the doses when his sugars start improving. - I advised him to: Patient Instructions   Please increase:  Insulin Before breakfast Before lunch Before dinner  Regular 25-30 25-30 25-30 (if sugars <100, take 15 units)  NPH 45  45   If you take dinnertime insulin after dinner, take 50% of the dose.  Please return in 4 months with your sugar log.    - advised to check sugars at different times of the day - 3x a day, rotating check times - advised for yearly eye exams >> he is UTD - return to clinic in 4 months    2. HL -Reviewed latest lipid panel from last month: LDL at goal, HDL low, triglycerides high: Lab Results  Component Value Date   CHOL 113 11/21/2019   HDL 21.50 (L) 11/21/2019   LDLCALC 53 05/11/2019   LDLDIRECT 57.0 11/21/2019   TRIG 250.0 (H) 11/21/2019   CHOLHDL 5 11/21/2019  -Continues Lipitor, fenofibrate, flaxseed oil, without side effects  3.  Obesity class 2 -He did very well in the past on the plant-based diet and I strongly advised him to continue -lost 9 lbs since last OV  [name], MD PhD [facility] Endocrinology

## 2019-12-28 NOTE — Patient Instructions (Signed)
Please increase: Insulin Before breakfast Before lunch Before dinner  Regular 25-30 25-30 25-30 (if sugars <100, take 15 units)  NPH 45  45   If you take dinnertime insulin after dinner, take 50% of the dose.  Please return in 4 months with your sugar log.

## 2019-12-29 DIAGNOSIS — M4802 Spinal stenosis, cervical region: Secondary | ICD-10-CM | POA: Diagnosis not present

## 2019-12-29 DIAGNOSIS — M5416 Radiculopathy, lumbar region: Secondary | ICD-10-CM | POA: Diagnosis not present

## 2019-12-29 DIAGNOSIS — Z6835 Body mass index (BMI) 35.0-35.9, adult: Secondary | ICD-10-CM | POA: Diagnosis not present

## 2019-12-29 DIAGNOSIS — I739 Peripheral vascular disease, unspecified: Secondary | ICD-10-CM | POA: Diagnosis not present

## 2019-12-30 DIAGNOSIS — G4733 Obstructive sleep apnea (adult) (pediatric): Secondary | ICD-10-CM | POA: Diagnosis not present

## 2020-01-01 ENCOUNTER — Telehealth: Payer: Self-pay | Admitting: *Deleted

## 2020-01-01 NOTE — Telephone Encounter (Signed)
   [facility] Pre-operative Risk Assessment    Request for surgical clearance:  1. What type of surgery is being performed? C3-4, C4-5 ANTERIOR CERVICAL DECOMPRESSION/DISCECTOMY/FUSION   2. When is this surgery scheduled? 01/08/20   3. What type of clearance is required (medical clearance vs. Pharmacy clearance to hold med vs. Both)? MEDICAL  4. Are there any medications that need to be held prior to surgery and how long? ASA   5. Practice name and name of physician performing surgery? [facility]; DR. [name]   6. What is your office phone number [phone]   7.   What is your office fax number [phone] ATTN: JESSICA  8.   Anesthesia type (None, local, MAC, general) ? GENERAL   Julaine Hua 01/01/2020, 11:25 AM  _________________________________________________________________   (provider comments below)

## 2020-01-01 NOTE — Telephone Encounter (Signed)
Hi Dr. Stanford Breed,  Mr. Bertsch is scheduled for anterior cervical decompression/discectomy/fusion on 01/08/2020. Can you please comment on how long he can hold Aspirin for? He has a history of CAD s/p PCI to LAD, RCA, and LCX. Last cardiac cath in 02/2017 showed patent stents. He also has history of diastolic CHF with EF of 0000000 on Echo in 09/2018. You last saw him on 12/06/2019 at which time he denied any chest pain but did report some increased DOE so his Imdur and Lasix were increased. I will call patient after you give your recommendation to make sure symptoms are stable.   Please route response back to P CV DIV PREOP.  Thank you!

## 2020-01-01 NOTE — Telephone Encounter (Signed)
Hold aspirin 7 days prior to procedure and resume after. Kirk Ruths

## 2020-01-01 NOTE — Telephone Encounter (Signed)
   Primary Cardiologist: Kirk Ruths, MD  Chart reviewed as part of pre-operative protocol coverage. Patient last seen by Dr. Stanford Breed on 12/06/2019 at which time he reported some increased dyspnea on exertion but no dyspnea at rest and no chest pain. Imdur and Lasix were increased. Patient contacted today as part of pre-op evaluation. He states he has been doing well from a cardiac standpoint since recent visit with Dr. Stanford Breed. He still reports some shortness of breath with activity but states this is stable and is not significant. He states his activity is limited by significant hip/knee pain not shortness of breath. He denies any chest pain, shortness of breath at rest, orthopnea, PND, edema, palpitations, syncope. Although activity is limited by hip/knee pain, he is still able to take care of all activities of daily living and house chores. Able to complete >4 METS without angina.  Given past medical history and time since last visit, based on ACC/AHA guidelines, William Hendrix would be at acceptable risk for the planned procedure without further cardiovascular testing.   Per Dr. Stanford Breed, [place] to hold Aspirin for 7 days prior to procedure. This should be resumed as soon as possible following procedure.   I will route this recommendation to the requesting party via Epic fax function and remove from pre-op pool.  Please call with questions.  Darreld [name], PA-C 01/01/2020, 3:05 PM

## 2020-01-02 DIAGNOSIS — Z1152 Encounter for screening for COVID-19: Secondary | ICD-10-CM | POA: Diagnosis not present

## 2020-01-04 ENCOUNTER — Other Ambulatory Visit (HOSPITAL_COMMUNITY): Payer: Self-pay | Admitting: Neurological Surgery

## 2020-01-04 ENCOUNTER — Ambulatory Visit (HOSPITAL_COMMUNITY)
Admission: RE | Admit: 2020-01-04 | Discharge: 2020-01-04 | Disposition: A | Discharge status: Home / Self Care | Payer: Medicare HMO | Source: Ambulatory Visit | Attending: Vascular Surgery | Admitting: Vascular Surgery

## 2020-01-04 ENCOUNTER — Other Ambulatory Visit: Payer: Self-pay

## 2020-01-04 DIAGNOSIS — I739 Peripheral vascular disease, unspecified: Secondary | ICD-10-CM

## 2020-01-05 DIAGNOSIS — M5416 Radiculopathy, lumbar region: Secondary | ICD-10-CM | POA: Diagnosis not present

## 2020-01-08 DIAGNOSIS — M5031 Other cervical disc degeneration,  high cervical region: Secondary | ICD-10-CM | POA: Diagnosis not present

## 2020-01-08 DIAGNOSIS — M4802 Spinal stenosis, cervical region: Secondary | ICD-10-CM | POA: Diagnosis not present

## 2020-01-09 ENCOUNTER — Emergency Department (HOSPITAL_COMMUNITY): Payer: Medicare HMO

## 2020-01-09 ENCOUNTER — Inpatient Hospital Stay (HOSPITAL_COMMUNITY)
Admission: EM | Admit: 2020-01-09 | Discharge: 2020-01-12 | DRG: 920 | Disposition: A | Discharge status: Home / Self Care | Payer: Medicare HMO | Attending: Neurological Surgery | Admitting: Neurological Surgery

## 2020-01-09 ENCOUNTER — Encounter (HOSPITAL_COMMUNITY): Payer: Self-pay | Admitting: Emergency Medicine

## 2020-01-09 ENCOUNTER — Other Ambulatory Visit: Payer: Self-pay

## 2020-01-09 DIAGNOSIS — N1832 Chronic kidney disease, stage 3b: Secondary | ICD-10-CM | POA: Diagnosis present

## 2020-01-09 DIAGNOSIS — R221 Localized swelling, mass and lump, neck: Secondary | ICD-10-CM | POA: Diagnosis not present

## 2020-01-09 DIAGNOSIS — M1990 Unspecified osteoarthritis, unspecified site: Secondary | ICD-10-CM | POA: Diagnosis present

## 2020-01-09 DIAGNOSIS — E669 Obesity, unspecified: Secondary | ICD-10-CM | POA: Diagnosis present

## 2020-01-09 DIAGNOSIS — Z794 Long term (current) use of insulin: Secondary | ICD-10-CM

## 2020-01-09 DIAGNOSIS — I5032 Chronic diastolic (congestive) heart failure: Secondary | ICD-10-CM | POA: Diagnosis present

## 2020-01-09 DIAGNOSIS — I2510 Atherosclerotic heart disease of native coronary artery without angina pectoris: Secondary | ICD-10-CM | POA: Diagnosis present

## 2020-01-09 DIAGNOSIS — G8929 Other chronic pain: Secondary | ICD-10-CM | POA: Diagnosis present

## 2020-01-09 DIAGNOSIS — Z91030 Bee allergy status: Secondary | ICD-10-CM

## 2020-01-09 DIAGNOSIS — Z87891 Personal history of nicotine dependence: Secondary | ICD-10-CM

## 2020-01-09 DIAGNOSIS — I252 Old myocardial infarction: Secondary | ICD-10-CM | POA: Diagnosis not present

## 2020-01-09 DIAGNOSIS — G4733 Obstructive sleep apnea (adult) (pediatric): Secondary | ICD-10-CM | POA: Diagnosis present

## 2020-01-09 DIAGNOSIS — Z961 Presence of intraocular lens: Secondary | ICD-10-CM | POA: Diagnosis present

## 2020-01-09 DIAGNOSIS — E118 Type 2 diabetes mellitus with unspecified complications: Secondary | ICD-10-CM | POA: Diagnosis present

## 2020-01-09 DIAGNOSIS — Z8679 Personal history of other diseases of the circulatory system: Secondary | ICD-10-CM

## 2020-01-09 DIAGNOSIS — J449 Chronic obstructive pulmonary disease, unspecified: Secondary | ICD-10-CM | POA: Diagnosis present

## 2020-01-09 DIAGNOSIS — Z9841 Cataract extraction status, right eye: Secondary | ICD-10-CM | POA: Diagnosis not present

## 2020-01-09 DIAGNOSIS — R1310 Dysphagia, unspecified: Secondary | ICD-10-CM

## 2020-01-09 DIAGNOSIS — Z87442 Personal history of urinary calculi: Secondary | ICD-10-CM | POA: Diagnosis not present

## 2020-01-09 DIAGNOSIS — T8189XA Other complications of procedures, not elsewhere classified, initial encounter: Principal | ICD-10-CM | POA: Diagnosis present

## 2020-01-09 DIAGNOSIS — Z7982 Long term (current) use of aspirin: Secondary | ICD-10-CM

## 2020-01-09 DIAGNOSIS — Z9842 Cataract extraction status, left eye: Secondary | ICD-10-CM | POA: Diagnosis not present

## 2020-01-09 DIAGNOSIS — E1142 Type 2 diabetes mellitus with diabetic polyneuropathy: Secondary | ICD-10-CM | POA: Diagnosis present

## 2020-01-09 DIAGNOSIS — R609 Edema, unspecified: Secondary | ICD-10-CM

## 2020-01-09 DIAGNOSIS — Z955 Presence of coronary angioplasty implant and graft: Secondary | ICD-10-CM | POA: Diagnosis not present

## 2020-01-09 DIAGNOSIS — E1122 Type 2 diabetes mellitus with diabetic chronic kidney disease: Secondary | ICD-10-CM | POA: Diagnosis present

## 2020-01-09 DIAGNOSIS — Y838 Other surgical procedures as the cause of abnormal reaction of the patient, or of later complication, without mention of misadventure at the time of the procedure: Secondary | ICD-10-CM | POA: Diagnosis present

## 2020-01-09 DIAGNOSIS — Z85820 Personal history of malignant melanoma of skin: Secondary | ICD-10-CM | POA: Diagnosis not present

## 2020-01-09 DIAGNOSIS — E1165 Type 2 diabetes mellitus with hyperglycemia: Secondary | ICD-10-CM | POA: Diagnosis present

## 2020-01-09 DIAGNOSIS — E785 Hyperlipidemia, unspecified: Secondary | ICD-10-CM | POA: Diagnosis not present

## 2020-01-09 DIAGNOSIS — I130 Hypertensive heart and chronic kidney disease with heart failure and stage 1 through stage 4 chronic kidney disease, or unspecified chronic kidney disease: Secondary | ICD-10-CM | POA: Diagnosis present

## 2020-01-09 DIAGNOSIS — Z79899 Other long term (current) drug therapy: Secondary | ICD-10-CM

## 2020-01-09 DIAGNOSIS — R52 Pain, unspecified: Secondary | ICD-10-CM

## 2020-01-09 DIAGNOSIS — Z8249 Family history of ischemic heart disease and other diseases of the circulatory system: Secondary | ICD-10-CM

## 2020-01-09 DIAGNOSIS — K219 Gastro-esophageal reflux disease without esophagitis: Secondary | ICD-10-CM | POA: Diagnosis present

## 2020-01-09 DIAGNOSIS — Z888 Allergy status to other drugs, medicaments and biological substances status: Secondary | ICD-10-CM

## 2020-01-09 DIAGNOSIS — Z6837 Body mass index (BMI) 37.0-37.9, adult: Secondary | ICD-10-CM

## 2020-01-09 DIAGNOSIS — T819XXA Unspecified complication of procedure, initial encounter: Secondary | ICD-10-CM

## 2020-01-09 DIAGNOSIS — Z885 Allergy status to narcotic agent status: Secondary | ICD-10-CM

## 2020-01-09 LAB — COMPREHENSIVE METABOLIC PANEL
ALT: 19 U/L (ref 0–44)
AST: 23 U/L (ref 15–41)
Albumin: 3.8 g/dL (ref 3.5–5.0)
Alkaline Phosphatase: 51 U/L (ref 38–126)
Anion gap: 11 (ref 5–15)
BUN: 25 mg/dL — ABNORMAL HIGH (ref 8–23)
CO2: 28 mmol/L (ref 22–32)
Calcium: 9.5 mg/dL (ref 8.9–10.3)
Chloride: 97 mmol/L — ABNORMAL LOW (ref 98–111)
Creatinine, Ser: 1.58 mg/dL — ABNORMAL HIGH (ref 0.61–1.24)
GFR calc Af Amer: 48 mL/min — ABNORMAL LOW (ref >60)
GFR calc non Af Amer: 41 mL/min — ABNORMAL LOW (ref >60)
Glucose, Bld: 444 mg/dL — ABNORMAL HIGH (ref 70–99)
Potassium: 4.5 mmol/L (ref 3.5–5.1)
Sodium: 136 mmol/L (ref 135–145)
Total Bilirubin: 0.5 mg/dL (ref 0.3–1.2)
Total Protein: 6.5 g/dL (ref 6.5–8.1)

## 2020-01-09 LAB — CBC WITH DIFFERENTIAL/PLATELET
Abs Immature Granulocytes: 0.04 10*3/uL (ref 0.00–0.07)
Basophils Absolute: 0 10*3/uL (ref 0.0–0.1)
Basophils Relative: 0 %
Eosinophils Absolute: 0.1 10*3/uL (ref 0.0–0.5)
Eosinophils Relative: 1 %
HCT: 38.1 % — ABNORMAL LOW (ref 39.0–52.0)
Hemoglobin: 12.4 g/dL — ABNORMAL LOW (ref 13.0–17.0)
Immature Granulocytes: 1 %
Lymphocytes Relative: 26 %
Lymphs Abs: 2.1 10*3/uL (ref 0.7–4.0)
MCH: 30.3 pg (ref 26.0–34.0)
MCHC: 32.5 g/dL (ref 30.0–36.0)
MCV: 93.2 fL (ref 80.0–100.0)
Monocytes Absolute: 0.5 10*3/uL (ref 0.1–1.0)
Monocytes Relative: 6 %
Neutro Abs: 5.4 10*3/uL (ref 1.7–7.7)
Neutrophils Relative %: 66 %
Platelets: 170 10*3/uL (ref 150–400)
RBC: 4.09 MIL/uL — ABNORMAL LOW (ref 4.22–5.81)
RDW: 14.5 % (ref 11.5–15.5)
WBC: 8.2 10*3/uL (ref 4.0–10.5)
nRBC: 0 % (ref 0.0–0.2)

## 2020-01-09 MED ORDER — FENTANYL CITRATE (PF) 100 MCG/2ML IJ SOLN
50.0000 ug | Freq: Once | INTRAMUSCULAR | Status: AC
  Administered 2020-01-09: 23:00:00 50 ug via INTRAVENOUS
  Filled 2020-01-09: qty 2

## 2020-01-09 MED ORDER — INSULIN NPH (HUMAN) (ISOPHANE) 100 UNIT/ML ~~LOC~~ SUSP
20.0000 [IU] | Freq: Two times a day (BID) | SUBCUTANEOUS | Status: DC
  Administered 2020-01-10: 20 [IU] via SUBCUTANEOUS
  Filled 2020-01-09: qty 10

## 2020-01-09 MED ORDER — INSULIN ASPART 100 UNIT/ML ~~LOC~~ SOLN
0.0000 [IU] | SUBCUTANEOUS | Status: DC
  Administered 2020-01-10: 05:00:00 15 [IU] via SUBCUTANEOUS

## 2020-01-09 MED ORDER — INSULIN NPH (HUMAN) (ISOPHANE) 100 UNIT/ML ~~LOC~~ SUSP: 15.0000 [IU] | Freq: Two times a day (BID) | SUBCUTANEOUS | Status: DC

## 2020-01-09 MED ORDER — DEXAMETHASONE SODIUM PHOSPHATE 10 MG/ML IJ SOLN
10.0000 mg | Freq: Once | INTRAMUSCULAR | Status: AC
  Administered 2020-01-09: 22:00:00 10 mg via INTRAVENOUS
  Filled 2020-01-09: qty 1

## 2020-01-09 MED ORDER — IOHEXOL 300 MG/ML  SOLN
75.0000 mL | Freq: Once | INTRAMUSCULAR | Status: AC | PRN
  Administered 2020-01-09: 22:00:00 75 mL via INTRAVENOUS

## 2020-01-09 MED ORDER — MORPHINE SULFATE (PF) 4 MG/ML IV SOLN
4.0000 mg | Freq: Once | INTRAVENOUS | Status: AC
  Administered 2020-01-09: 4 mg via INTRAVENOUS
  Filled 2020-01-09: qty 1

## 2020-01-09 NOTE — ED Triage Notes (Signed)
Patient reports pain and swelling at incision site ( neck )  this morning , post cervical surgery by Dr. Ellene Route yesterday . No fever or chills , respirations unlabored / no oral swelling .

## 2020-01-09 NOTE — H&P (Signed)
Reason for Consult:neck swelling with dysphagia Referring Physician: [name] is an 79 y.o. male.  HPI: Patient underwent anterior cervical decompression and fusion at C 34 and C 45 levels yesterday with Dr. Ellene Route at [facility].  He had bilateral upper extremity complaints with early cervical myelopathy.  He did well with surgery, but developed dysphagia and his daughter called to say he was not able to swallow and that he had visible neck swelling.  He came to ER.  Neck CT shows 8 mm prevertebral soft tissue collection (may represent swelling, no clear evidence of hematoma) from C 2 - C 5 levels.  The patient says he gets choked, but has no difficulty with his airway.  His blood sugars have been elevated (he is diabetic).  He feels better after his neck surgery and has improved upper extremity numbness and weakness.    Past Medical History:  Diagnosis Date  . AAA (abdominal aortic aneurysm) ([place]) 12/15/2014   a. Mild aneurysmal dilatation of the infrarenal abdominal aorta 3.2 cm - f/u due by 2019.  . Arthritis    "all over" (07/30/2016)  . CAD (coronary artery disease)    a. stent to LAD 2000. b. possible spasm by cath 2001. c. IVUS/PTCA/DES to mLAD 05/2013. d. PTCA/DES of dRCA into ostial rPDA 07/2013. e. PTCA of OM2, DES to Cx, DES to [facility] 07/2016.  Marland Kitchen Chronic bronchitis ([place])   . Chronic diastolic CHF (congestive heart failure) ([place])   . Chronic lower back pain   . Chronic neck pain   . CKD (chronic kidney disease), stage III   . Diabetic peripheral neuropathy ([place])   . Ejection fraction    55%, 07/2010, mild inferior hypo  . GERD (gastroesophageal reflux disease)   . Gout   . H/O diverticulitis of colon 01/31/2015  . H/O hiatal hernia   . History of blood transfusion ~ 10/1940   "had pneumonia"  . History of kidney stones   . HTN (hypertension)   . Hyperlipidemia   . Melanoma of forearm, left ([place]) 02/2011   with wide excision   . Myocardial infarction ([place])   .  Nephrolithiasis   . Obesity   . OSA on CPAP     [name] since 2000  . Peripheral neuropathy    both feet  . Positive D-dimer    a. significant elevation ,hospital 07/2010, etiology unclear. b. D Dimer chronically > 20.  Marland Kitchen Renal insufficiency   . Skin cancer   . Spinal stenosis    a. s/p surgical repair 2013  . Spinal stenosis of lumbar region   . Type II diabetes mellitus ([place])   . Ventral hernia     Past Surgical History:  Procedure Laterality Date  . ANTERIOR LAT LUMBAR FUSION  10/22/2017   Procedure: Lumbar two-three Lumbar three-four Lumbar four-five Anteriolateral lumbar interbody fusion with percutaneous pedicle screw fixation and infuse;  Surgeon: [name], MD;  Location: [facility];  Service: Neurosurgery;;  . APPLICATION OF ROBOTIC ASSISTANCE FOR SPINAL PROCEDURE  10/22/2017   Procedure: APPLICATION OF ROBOTIC ASSISTANCE FOR SPINAL PROCEDURE;  Surgeon: [name], MD;  Location: [facility];  Service: Neurosurgery;;  . BACK SURGERY    . CARDIAC CATHETERIZATION N/A 07/30/2016   Procedure: Left Heart Cath and Coronary Angiography;  Surgeon: Nelva Bush, MD;  Location: [place] CV LAB;  Service: Cardiovascular;  Laterality: N/A;  . CARDIAC CATHETERIZATION N/A 07/30/2016   Procedure: Coronary Stent Intervention;  Surgeon: Nelva Bush, MD;  Location:  [facility] INVASIVE CV LAB;  Service: Cardiovascular;  Laterality: N/A;  Mid CFX and MID RCA  . CARDIAC CATHETERIZATION N/A 07/30/2016   Procedure: Coronary Balloon Angioplasty;  Surgeon: Nelva Bush, MD;  Location: [place] CV LAB;  Service: Cardiovascular;  Laterality: N/A;  OM 1  . CARPAL TUNNEL RELEASE Left   . CATARACT EXTRACTION W/ INTRAOCULAR LENS  IMPLANT, BILATERAL Bilateral ~ 2014  . CERVICAL DISC SURGERY  1990s  . CORONARY ANGIOPLASTY WITH STENT PLACEMENT  05/2013  . CORONARY ANGIOPLASTY WITH STENT PLACEMENT  07/26/2013   DES to RCA extending to PDA    . CORONARY ANGIOPLASTY WITH STENT PLACEMENT  07/30/2016  .  CORONARY ANGIOPLASTY WITH STENT PLACEMENT  2000   CAD  . DENTAL SURGERY  04/2016   "got infected; had to dig it out"  . EYE SURGERY    . KNEE ARTHROSCOPY Right 1990's   rt  . LEFT AND RIGHT HEART CATHETERIZATION WITH CORONARY ANGIOGRAM N/A 07/26/2013   Procedure: LEFT AND RIGHT HEART CATHETERIZATION WITH CORONARY ANGIOGRAM;  Surgeon: [name], MD;  Location: [facility] CATH LAB;  Service: Cardiovascular;  Laterality: N/A;  . LEFT HEART CATH AND CORONARY ANGIOGRAPHY N/A 02/26/2017   Procedure: Left Heart Cath and Coronary Angiography;  Surgeon: Sherren Mocha, MD;  Location: [facility] CV LAB;  Service: Cardiovascular;  Laterality: N/A;  . LEFT HEART CATHETERIZATION WITH CORONARY ANGIOGRAM N/A 05/19/2013   Procedure: LEFT HEART CATHETERIZATION WITH CORONARY ANGIOGRAM;  Surgeon: [name], MD;  Location: [facility] CATH LAB;  Service: Cardiovascular;  Laterality: N/A;  . LUMBAR LAMINECTOMY/DECOMPRESSION MICRODISCECTOMY  08/30/2012   Procedure: LUMBAR LAMINECTOMY/DECOMPRESSION MICRODISCECTOMY 2 LEVELS;  Surgeon: [name], MD;  Location: [place] NEURO ORS;  Service: Neurosurgery;  Laterality: Bilateral;  Bilateral Lumbar three-four Lumbar four-five Laminotomies  . LUMBAR PERCUTANEOUS PEDICLE SCREW 1 LEVEL Bilateral 10/22/2017   Procedure: LUMBAR PERCUTANEOUS PEDICLE SCREW 1 LEVEL;  Surgeon: [name], MD;  Location: [facility];  Service: Neurosurgery;  Laterality: Bilateral;  . LUMBAR SPINE SURGERY  04/07/2016   Dr. Ellene Route; "?ruptured disc"  . MELANOMA EXCISION Left 02/2011   forearm  . NASAL SINUS SURGERY  1970s   "cut windows in sinus pockets"  . PERCUTANEOUS CORONARY STENT INTERVENTION (PCI-S)  05/19/2013   Procedure: PERCUTANEOUS CORONARY STENT INTERVENTION (PCI-S);  Surgeon: [name], MD;  Location: [facility] CATH LAB;  Service: Cardiovascular;;  . ULNAR TUNNEL RELEASE Left 04/07/2016   Dr. Ellene Route    Family History  Problem Relation Age of Onset  . Cancer Mother        intestinal   . Heart  disease Mother   . Cancer Father        prostate  . Migraines Daughter   . Leukemia Sister   . Heart disease Sister   . Prostate cancer Other   . Kidney cancer Other   . Cancer Other        Bladder cancer  . Coronary artery disease Other   . Hypertension Sister   . Hypertension Brother   . Heart attack Neg Hx   . Stroke Neg Hx     Social History:  reports that he quit smoking about 23 years ago. His smoking use included cigarettes. He has a 90.00 pack-year smoking history. He has never used smokeless tobacco. He reports that he does not drink alcohol or use drugs.  Allergies:  Allergies  Allergen Reactions  . Brilinta [Ticagrelor] Shortness Of Breath  . Insulin Detemir Swelling, Other (See Comments) and Itching  Patient had redness and swelling and tenderness at injection site. Patient had redness and swelling and tenderness at injection site.  . Bee Venom   . Codeine Other (See Comments)    Makes him "shakey", is OK with hydrocodone GI UPSET & TREMORS Makes him "shakey", is OK with hydrocodone Makes him "shakey", is OK with hydrocodone GI UPSET & TREMORS  . Lipitor [Atorvastatin] Other (See Comments)    Muscle aches; affects liver function- Patient is currently taking  . Novolog Mix [Insulin Aspart Prot & Aspart] Other (See Comments)    Causes skin to swell/itch at injection site    Medications: I have reviewed the patient's current medications.  Results for orders placed or performed during the hospital encounter of 01/09/20 (from the past 48 hour(s))  CBC with Differential     Status: Abnormal   Collection Time: 01/09/20  7:53 PM  Result Value Ref Range   WBC 8.2 4.0 - 10.5 K/uL   RBC 4.09 (L) 4.22 - 5.81 MIL/uL   Hemoglobin 12.4 (L) 13.0 - 17.0 g/dL   HCT 38.1 (L) 39.0 - 52.0 %   MCV 93.2 80.0 - 100.0 fL   MCH 30.3 26.0 - 34.0 pg   MCHC 32.5 30.0 - 36.0 g/dL   RDW 14.5 11.5 - 15.5 %   Platelets 170 150 - 400 K/uL   nRBC 0.0 0.0 - 0.2 %   Neutrophils  Relative % 66 %   Neutro Abs 5.4 1.7 - 7.7 K/uL   Lymphocytes Relative 26 %   Lymphs Abs 2.1 0.7 - 4.0 K/uL   Monocytes Relative 6 %   Monocytes Absolute 0.5 0.1 - 1.0 K/uL   Eosinophils Relative 1 %   Eosinophils Absolute 0.1 0.0 - 0.5 K/uL   Basophils Relative 0 %   Basophils Absolute 0.0 0.0 - 0.1 K/uL   Immature Granulocytes 1 %   Abs Immature Granulocytes 0.04 0.00 - 0.07 K/uL    Comment: Performed at [facility], [address], [place], [place] [address]  Comprehensive metabolic panel     Status: Abnormal   Collection Time: 01/09/20  7:53 PM  Result Value Ref Range   Sodium 136 135 - 145 mmol/L   Potassium 4.5 3.5 - 5.1 mmol/L   Chloride 97 (L) 98 - 111 mmol/L   CO2 28 22 - 32 mmol/L   Glucose, Bld 444 (H) 70 - 99 mg/dL    Comment: Glucose reference range applies only to samples taken after fasting for at least 8 hours.   BUN 25 (H) 8 - 23 mg/dL   Creatinine, Ser 1.58 (H) 0.61 - 1.24 mg/dL   Calcium 9.5 8.9 - 10.3 mg/dL   Total Protein 6.5 6.5 - 8.1 g/dL   Albumin 3.8 3.5 - 5.0 g/dL   AST 23 15 - 41 U/L   ALT 19 0 - 44 U/L   Alkaline Phosphatase 51 38 - 126 U/L   Total Bilirubin 0.5 0.3 - 1.2 mg/dL   GFR calc non Af Amer 41 (L) >60 mL/min   GFR calc Af Amer 48 (L) >60 mL/min   Anion gap 11 5 - 15    Comment: Performed at [facility] [address], [place], [place] [address]    DG Neck Soft Tissue  Result Date: 01/09/2020 CLINICAL DATA:  Pain and swelling at incision site history of anterior cervical fusion pain with swallowing EXAM: NECK SOFT TISSUES - 1+ VIEW COMPARISON:  12/23/2019, 01/08/2020 FINDINGS: Anterior plate  and fixating screws C3 through C5 with interbody device at C3-C4 and C4-C5. Slight enlargement of prevertebral soft tissues at the level of surgical hardware presumably due to postoperative edema. Gas and fluid levels anterior to the thyroid cartilage. Edema within the subcutaneous soft tissues. IMPRESSION: 1. Stranding and fluid levels  within the superficial anterior neck soft tissues at the level of the thyroid, this would be better evaluated with CT 2. Postsurgical changes C3 through C5. Slight enlargement of prevertebral soft tissues at this level, likely postoperative edema. Electronically Signed   By: Donavan Foil M.D.   On: 01/09/2020 20:10   CT Soft Tissue Neck W Contrast  Result Date: 01/09/2020 CLINICAL DATA:  Neck pain and swelling after surgery. EXAM: CT NECK WITH CONTRAST TECHNIQUE: Multidetector CT imaging of the neck was performed using the standard protocol following the bolus administration of intravenous contrast. CONTRAST:  45mL OMNIPAQUE IOHEXOL 300 MG/ML  SOLN COMPARISON:  None. FINDINGS: Pharynx and larynx: There is a retropharyngeal effusion that extends from C2 to C5. The effusion measures 8 mm in thickness. There is mild mass effect on the supraglottic larynx. Normal pharynx. Salivary glands: No inflammation, mass, or stone. Thyroid: Normal. Lymph nodes: None enlarged or abnormal density. Vascular: Bilateral carotid bifurcation calcific atherosclerosis. Limited intracranial: Negative. Visualized orbits: Negative. Mastoids and visualized paranasal sinuses: Clear. Skeleton: C3-5 ACDF. Upper chest: Negative. Other: Areas of gas tracking along the soft tissues of the left anterior neck, postsurgical. IMPRESSION: 1. An 8 mm postoperative retropharyngeal effusion extending from C2 to C5 with mild mass effect on the supraglottic larynx. 2. C3-5 ACDF with expected postoperative appearance of the soft tissues of the left anterior neck. Electronically Signed   By: Ulyses Jarred M.D.   On: 01/09/2020 22:30    Review of Systems - Negative except per HPI    Blood pressure (!) 174/69, pulse 77, temperature 98.3 F (36.8 C), temperature source Oral, resp. rate 18, height 5\' 10"  (1.778 m), weight 120 kg, SpO2 98 %. Physical Exam  Constitutional: He is oriented to person, place, and time. He appears well-developed and  well-nourished.  HENT:  Head: Normocephalic and atraumatic.  Eyes: Pupils are equal, round, and reactive to light. EOM are normal.  Neck: Tracheal tenderness present. No tracheal deviation present.    Cardiovascular: Normal rate and regular rhythm.  Respiratory: Effort normal and breath sounds normal. No stridor.  GI: Soft. Bowel sounds are normal.  Musculoskeletal:        General: Normal range of motion.     Cervical back: Edema present. No erythema or rigidity.  Neurological: He is alert and oriented to person, place, and time. He has normal strength and normal reflexes. No cranial nerve deficit or sensory deficit. GCS eye subscore is 4. GCS verbal subscore is 5. GCS motor subscore is 6.  Some anterior neck swelling, this is not tense.  No tracheal deviation, no difficulty with speech, phonation, breathing    Assessment/Plan: Patient has postop swelling in neck following 2 level ACDF.  He is complaining of being choked when he swallows.  He is able to swallow his secretions.  He has no difficulty with speech or airway.  He is doing well neurologically after his surgery.  I will admit patient to stepdown for observation.  He will be given steroids.  I have asked Hospitalists to consult, given his poorly controlled diabetes and multiple medical co-morbidities.  I will make patient NPO.  Dr. Ellene Route to follow in AM.  I think likelihood  of patient requiring surgical wound exploration is low.  Peggyann Shoals, MD 01/09/2020, 11:09 PM

## 2020-01-09 NOTE — Consult Note (Addendum)
[facility] Medical Consultation  William Hendrix [id number] DOB: 06-24-1941 DOA: 01/09/2020 PCP: [name], MD   Requesting physician: Erline Levine, MD (Neurosurgery) Date of consultation: 01/09/2020 Reason for consultation: Diabetic Management   Impression/Recommendations Active Problems:   Controlled diabetes mellitus type 2 with complications [facility])   Post-operative complication  79 yo M with PMH of insulin-dependent T2DM, CAD, CKD who presented to ER for post-op complication after cervical decompression and fusion surgery yesterday. Neurosurgery admitting. Consulted for DM management per EDP.   1. Type 2 DM; insulin-dependent - A1c 8.0 in Feb 2021 - PTA Medications: NPH 45 units BID, Novolin 35 units with breakfast/25 units with lunch/35 units with dinner - Glucose 444 on admission; patient has not been taking insulin for past 2 days as he was NPO yesterday for surgery and then has barely been able to eat today  - No anion gap - Pending H&P from Neurosurgery, but patient has received Decadron in ED - Per patient, he will remain NPO at this time due to complications with eating but no surgery planned at this time - Will decrease NPH to 20 units BID with sliding scale to start 4 hours later; will likely need to further adjust pending steroid dosing and when patient begins eating again - CBG's q4h  Patient should be seen again tomorrow by [facility] consultant. Please contact me if I can be of assistance in the meanwhile. Thank you for this consultation.  Chief Complaint: Neck Swelling post-op  HPI:  Patient's daughter at bedside as well and contributed to some of history. Patient reports that he had cervical disc surgery yesterday and has had this done several times. He noticed significant neck swelling today and had difficulty eating or drinking anything. Speech has been muffled. He attempted to eat some chicken noodle soup but was only able to get a few bites down. Reports  pain and swelling at incision site. Denies other complaints beyond feeling very thirsty. Denies headache, dizziness, fever, chills, cough, SOB, chest pain, abdominal pain, nausea, vomiting, diarrhea, constipation, dysuria, hematuria, hematochezia, melena, difficulty moving arms/legs, confusion or any other complaints.  Review of Systems:  All other systems negative unless noted above in HPI.   Past Medical History:  Diagnosis Date  . AAA (abdominal aortic aneurysm) ([place]) 12/15/2014   a. Mild aneurysmal dilatation of the infrarenal abdominal aorta 3.2 cm - f/u due by 2019.  . Arthritis    "all over" (07/30/2016)  . CAD (coronary artery disease)    a. stent to LAD 2000. b. possible spasm by cath 2001. c. IVUS/PTCA/DES to mLAD 05/2013. d. PTCA/DES of dRCA into ostial rPDA 07/2013. e. PTCA of OM2, DES to Cx, DES to [facility] 07/2016.  Marland Kitchen Chronic bronchitis ([place])   . Chronic diastolic CHF (congestive heart failure) ([place])   . Chronic lower back pain   . Chronic neck pain   . CKD (chronic kidney disease), stage III   . Diabetic peripheral neuropathy ([place])   . Ejection fraction    55%, 07/2010, mild inferior hypo  . GERD (gastroesophageal reflux disease)   . Gout   . H/O diverticulitis of colon 01/31/2015  . H/O hiatal hernia   . History of blood transfusion ~ 10/1940   "had pneumonia"  . History of kidney stones   . HTN (hypertension)   . Hyperlipidemia   . Melanoma of forearm, left ([place]) 02/2011   with wide excision   . Myocardial infarction ([place])   . Nephrolithiasis   . Obesity   .  OSA on CPAP     [name] since 2000  . Peripheral neuropathy    both feet  . Positive D-dimer    a. significant elevation ,hospital 07/2010, etiology unclear. b. D Dimer chronically > 20.  Marland Kitchen Renal insufficiency   . Skin cancer   . Spinal stenosis    a. s/p surgical repair 2013  . Spinal stenosis of lumbar region   . Type II diabetes mellitus ([place])   . Ventral hernia    Past Surgical History:  Procedure  Laterality Date  . ANTERIOR LAT LUMBAR FUSION  10/22/2017   Procedure: Lumbar two-three Lumbar three-four Lumbar four-five Anteriolateral lumbar interbody fusion with percutaneous pedicle screw fixation and infuse;  Surgeon: [name], MD;  Location: [place];  Service: Neurosurgery;;  . APPLICATION OF ROBOTIC ASSISTANCE FOR SPINAL PROCEDURE  10/22/2017   Procedure: APPLICATION OF ROBOTIC ASSISTANCE FOR SPINAL PROCEDURE;  Surgeon: [name], MD;  Location: [place];  Service: Neurosurgery;;  . BACK SURGERY    . CARDIAC CATHETERIZATION N/A 07/30/2016   Procedure: Left Heart Cath and Coronary Angiography;  Surgeon: Nelva Bush, MD;  Location: [place] CV LAB;  Service: Cardiovascular;  Laterality: N/A;  . CARDIAC CATHETERIZATION N/A 07/30/2016   Procedure: Coronary Stent Intervention;  Surgeon: Nelva Bush, MD;  Location: [place] CV LAB;  Service: Cardiovascular;  Laterality: N/A;  Mid CFX and MID RCA  . CARDIAC CATHETERIZATION N/A 07/30/2016   Procedure: Coronary Balloon Angioplasty;  Surgeon: Nelva Bush, MD;  Location: [place] CV LAB;  Service: Cardiovascular;  Laterality: N/A;  OM 1  . CARPAL TUNNEL RELEASE Left   . CATARACT EXTRACTION W/ INTRAOCULAR LENS  IMPLANT, BILATERAL Bilateral ~ 2014  . CERVICAL DISC SURGERY  1990s  . CORONARY ANGIOPLASTY WITH STENT PLACEMENT  05/2013  . CORONARY ANGIOPLASTY WITH STENT PLACEMENT  07/26/2013   DES to RCA extending to PDA    . CORONARY ANGIOPLASTY WITH STENT PLACEMENT  07/30/2016  . CORONARY ANGIOPLASTY WITH STENT PLACEMENT  2000   CAD  . DENTAL SURGERY  04/2016   "got infected; had to dig it out"  . EYE SURGERY    . KNEE ARTHROSCOPY Right 1990's   rt  . LEFT AND RIGHT HEART CATHETERIZATION WITH CORONARY ANGIOGRAM N/A 07/26/2013   Procedure: LEFT AND RIGHT HEART CATHETERIZATION WITH CORONARY ANGIOGRAM;  Surgeon: [name], MD;  Location: [place] CATH LAB;  Service: Cardiovascular;  Laterality: N/A;  . LEFT HEART CATH AND  CORONARY ANGIOGRAPHY N/A 02/26/2017   Procedure: Left Heart Cath and Coronary Angiography;  Surgeon: Sherren Mocha, MD;  Location: [place] CV LAB;  Service: Cardiovascular;  Laterality: N/A;  . LEFT HEART CATHETERIZATION WITH CORONARY ANGIOGRAM N/A 05/19/2013   Procedure: LEFT HEART CATHETERIZATION WITH CORONARY ANGIOGRAM;  Surgeon: [name], MD;  Location: [facility] CATH LAB;  Service: Cardiovascular;  Laterality: N/A;  . LUMBAR LAMINECTOMY/DECOMPRESSION MICRODISCECTOMY  08/30/2012   Procedure: LUMBAR LAMINECTOMY/DECOMPRESSION MICRODISCECTOMY 2 LEVELS;  Surgeon: [name], MD;  Location: [place] NEURO ORS;  Service: Neurosurgery;  Laterality: Bilateral;  Bilateral Lumbar three-four Lumbar four-five Laminotomies  . LUMBAR PERCUTANEOUS PEDICLE SCREW 1 LEVEL Bilateral 10/22/2017   Procedure: LUMBAR PERCUTANEOUS PEDICLE SCREW 1 LEVEL;  Surgeon: [name], MD;  Location: [place];  Service: Neurosurgery;  Laterality: Bilateral;  . LUMBAR SPINE SURGERY  04/07/2016   Dr. Ellene Route; "?ruptured disc"  . MELANOMA EXCISION Left 02/2011   forearm  . NASAL SINUS SURGERY  1970s   "cut windows in sinus pockets"  .  PERCUTANEOUS CORONARY STENT INTERVENTION (PCI-S)  05/19/2013   Procedure: PERCUTANEOUS CORONARY STENT INTERVENTION (PCI-S);  Surgeon: [name], MD;  Location[facility] LAB;  Service: Cardiovascular;;  . ULNAR TUNNEL RELEASE Left 04/07/2016   Dr. Ellene Route   Social History:  reports that he quit smoking about 23 years ago. His smoking use included cigarettes. He has a 90.00 pack-year smoking history. He has never used smokeless tobacco. He reports that he does not drink alcohol or use drugs.  Allergies  Allergen Reactions  . Brilinta [Ticagrelor] Shortness Of Breath  . Insulin Detemir Swelling, Other (See Comments) and Itching    Patient had redness and swelling and tenderness at injection site. Patient had redness and swelling and tenderness at injection site.  . Bee Venom   . Codeine Other  (See Comments)    Makes him "shakey", is OK with hydrocodone GI UPSET & TREMORS Makes him "shakey", is OK with hydrocodone Makes him "shakey", is OK with hydrocodone GI UPSET & TREMORS  . Lipitor [Atorvastatin] Other (See Comments)    Muscle aches; affects liver function- Patient is currently taking  . Novolog Mix [Insulin Aspart Prot & Aspart] Other (See Comments)    Causes skin to swell/itch at injection site   Family History  Problem Relation Age of Onset  . Cancer Mother        intestinal   . Heart disease Mother   . Cancer Father        prostate  . Migraines Daughter   . Leukemia Sister   . Heart disease Sister   . Prostate cancer Other   . Kidney cancer Other   . Cancer Other        Bladder cancer  . Coronary artery disease Other   . Hypertension Sister   . Hypertension Brother   . Heart attack Neg Hx   . Stroke Neg Hx     Prior to Admission medications   Medication Sig Start Date End Date Taking? Authorizing Provider  acetaminophen (TYLENOL) 500 MG tablet Take 500 mg by mouth 2 (two) times daily.     [provider]  allopurinol (ZYLOPRIM) 300 MG tablet TAKE 1 TABLET EVERY DAY Patient taking differently: Take 300 mg by mouth daily.  07/20/19   [name], MD  aspirin EC 81 MG tablet Take 81 mg by mouth daily. 02/23/11   [name], MD  atorvastatin (LIPITOR) 40 MG tablet TAKE 1 TABLET EVERY DAY Patient taking differently: Take 40 mg by mouth daily.  07/20/19   [name], MD  B Complex Vitamins (B COMPLEX-B12 PO) Take 1 tablet by mouth once a week.  07/23/16   [provider]  Cholecalciferol (VITAMIN D3) 25 MCG (1000 UT) CAPS Take 1,000 Units by mouth at bedtime.    [provider]  fenofibrate 160 MG tablet TAKE 1 TABLET (160 MG TOTAL) BY MOUTH AT BEDTIME. 07/20/19   [name], MD  fluocinonide cream (LIDEX) [id number] % Apply 1 application topically daily as needed (for rashes).  08/11/12   [provider]   fluticasone (FLONASE) 50 MCG/ACT nasal spray Place 2 sprays into both nostrils daily. 08/11/17   Saguier, Percell Miller, PA-C  furosemide (LASIX) 40 MG tablet Take 2 tablets (80 mg total) by mouth daily. 12/06/19   Lelon Perla, MD  HYDROcodone-acetaminophen (NORCO) 10-325 MG tablet Take 0.5 tablets by mouth 2 (two) times daily. 08/10/18   [provider]  insulin NPH Human (NOVOLIN N) 100 UNIT/ML  injection Inject under skin 45 units in am and 45 in the evening Patient taking differently: Inject 45 Units into the skin 2 (two) times daily before a meal. Inject under skin 45 units in am and 45 in the evening 12/28/19   [name], MD  insulin regular (NOVOLIN R RELION) 100 units/mL injection Inject 25-30 units 3 times a day before meals. Patient taking differently: Inject 25-30 Units into the skin 3 (three) times daily before meals. Inject 25-30 units 3 times a day before meals. 12/28/19   [name], MD  Insulin Syringe-Needle U-100 (BD INSULIN SYRINGE U/F) 31G X 5/16" 0.3 ML MISC Use to inject insulin 5 times a day. Patient taking differently: 1 each by Other route 5 (five) times daily.  11/24/19   [name], MD  isosorbide mononitrate (IMDUR) 30 MG 24 hr tablet TAKE 1 TABLET (30 MG TOTAL) BY MOUTH DAILY. 12/05/19   Lelon Perla, MD  MAGNESIUM PO Take 1 tablet by mouth at bedtime.    [provider]  metoprolol tartrate (LOPRESSOR) 50 MG tablet TAKE 1 TABLET TWICE DAILY 12/22/19   [name], MD  nitroGLYCERIN (NITROSTAT) 0.4 MG SL tablet Place 1 tablet (0.4 mg total) under the tongue every 5 (five) minutes as needed for chest pain. 02/16/18   Lelon Perla, MD  pantoprazole (PROTONIX) 20 MG tablet TAKE 1 TABLET (20 MG TOTAL) BY MOUTH DAILY. 12/08/19   [name], MD  Probiotic Product (PROBIOTIC PO) Take 1 capsule by mouth at bedtime.     [provider]  tiZANidine (ZANAFLEX) 4 MG tablet Take 4 mg by mouth 2 (two) times daily. 08/12/18    [provider]  TRUE METRIX BLOOD GLUCOSE TEST test strip TEST BLOOD SUGAR THREE TIMES DAILY AS DIRECTED Patient taking differently: 1 each by Other route in the morning, at noon, and at bedtime.  07/28/19   [name], MD  TRUEplus Lancets 33G MISC Use to check blood sugar 3 times a day Patient taking differently: 1 each by Other route in the morning, at noon, and at bedtime. Use to check blood sugar 3 times a day 11/24/19   [name], MD   Physical Exam: Blood pressure (!) 174/69, pulse 77, temperature 98.3 F (36.8 C), temperature source Oral, resp. rate 18, height 5\' 10"  (1.778 m), weight 120 kg, SpO2 98 %. Vitals:   01/09/20 2230 01/09/20 2245  BP: (!) 167/72 (!) 174/69  Pulse: 79 77  Resp:    Temp:    SpO2: 97% 98%     General:  AAOx4. Resting in bed. Muffled speech. Obese.   Eyes: EOMI  ENT: Hearing grossly normal.  Neck: Swelling noted at left anterior neck over incision site.  Cardiovascular: RRR, no MGR; trace LE edema   Respiratory: Normal effort, CTAB  Abdomen: Soft, non-tender  Skin: No rashes/wounds on exposed areas with exception of small closed incision site of anterior left neck.   Musculoskeletal: Grossly normal upper and lower extremity tone and movement.   Psychiatric: Normal mood and affect.  Neurologic: Grossly normal.  Labs on Admission:  Basic Metabolic Panel: Recent Labs  Lab 01/09/20 1953  NA 136  K 4.5  CL 97*  CO2 28  GLUCOSE 444*  BUN 25*  CREATININE 1.58*  CALCIUM 9.5   Liver Function Tests: Recent Labs  Lab 01/09/20 1953  AST 23  ALT 19  ALKPHOS 51  BILITOT 0.5  PROT 6.5  ALBUMIN 3.8   No results for  input(s): LIPASE, AMYLASE in the last 168 hours. No results for input(s): AMMONIA in the last 168 hours. CBC: Recent Labs  Lab 01/09/20 1953  WBC 8.2  NEUTROABS 5.4  HGB 12.4*  HCT 38.1*  MCV 93.2  PLT 170   Cardiac Enzymes: No results for input(s): CKTOTAL, CKMB, CKMBINDEX, TROPONINI in  the last 168 hours. BNP: Invalid input(s): POCBNP CBG: No results for input(s): GLUCAP in the last 168 hours.  Radiological Exams on Admission: DG Neck Soft Tissue  Result Date: 01/09/2020 CLINICAL DATA:  Pain and swelling at incision site history of anterior cervical fusion pain with swallowing EXAM: NECK SOFT TISSUES - 1+ VIEW COMPARISON:  12/23/2019, 01/08/2020 FINDINGS: Anterior plate and fixating screws C3 through C5 with interbody device at C3-C4 and C4-C5. Slight enlargement of prevertebral soft tissues at the level of surgical hardware presumably due to postoperative edema. Gas and fluid levels anterior to the thyroid cartilage. Edema within the subcutaneous soft tissues. IMPRESSION: 1. Stranding and fluid levels within the superficial anterior neck soft tissues at the level of the thyroid, this would be better evaluated with CT 2. Postsurgical changes C3 through C5. Slight enlargement of prevertebral soft tissues at this level, likely postoperative edema. Electronically Signed   By: Donavan Foil M.D.   On: 01/09/2020 20:10   CT Soft Tissue Neck W Contrast  Result Date: 01/09/2020 CLINICAL DATA:  Neck pain and swelling after surgery. EXAM: CT NECK WITH CONTRAST TECHNIQUE: Multidetector CT imaging of the neck was performed using the standard protocol following the bolus administration of intravenous contrast. CONTRAST:  18mL OMNIPAQUE IOHEXOL 300 MG/ML  SOLN COMPARISON:  None. FINDINGS: Pharynx and larynx: There is a retropharyngeal effusion that extends from C2 to C5. The effusion measures 8 mm in thickness. There is mild mass effect on the supraglottic larynx. Normal pharynx. Salivary glands: No inflammation, mass, or stone. Thyroid: Normal. Lymph nodes: None enlarged or abnormal density. Vascular: Bilateral carotid bifurcation calcific atherosclerosis. Limited intracranial: Negative. Visualized orbits: Negative. Mastoids and visualized paranasal sinuses: Clear. Skeleton: C3-5 ACDF. Upper  chest: Negative. Other: Areas of gas tracking along the soft tissues of the left anterior neck, postsurgical. IMPRESSION: 1. An 8 mm postoperative retropharyngeal effusion extending from C2 to C5 with mild mass effect on the supraglottic larynx. 2. C3-5 ACDF with expected postoperative appearance of the soft tissues of the left anterior neck. Electronically Signed   By: Ulyses Jarred M.D.   On: 01/09/2020 22:30    EKG: None  Time spent: 40 minutes  Chauncey Mann, MD [facility] Pager [phone]  If 7PM-7AM, please contact night-coverage [email or web address] Password [email or web address] 01/09/2020, 11:05 PM

## 2020-01-09 NOTE — ED Provider Notes (Signed)
[facility] EMERGENCY DEPARTMENT Provider Note   CSN: [id number] Arrival date & time: 01/09/20  1858     History Chief Complaint  Patient presents with  . Post-op Problem    Cervical Surgery    William Hendrix is a 79 y.o. male resenting for evaluation of swelling and pain of anterior neck.  Patient states he had neck surgery with Dr. Ellene Route yesterday.  He ice the front and back of his neck as instructed last night, but throughout today he has had worsening pain and swelling of his anterior neck.  Patient states it is to the point where he is having difficulty swallowing due to pain and swelling.  He denies fevers, chills, or difficulty breathing.  He denies cough, chest pain, shortness of breath, nausea, vomiting, abdominal pain.  He has not been able to take his medication as prescribed, as he is unable to swallow the pills.  He called the neurosurgery office, is recommended he come to the ED.  Patient states he is not on anticoagulation, stopped taking 81 mg of aspirin last week.  Additional history obtained from chart review.  Patient with a history of AAA, CAD, CHF, COPD, CKD, diabetes, hypertension, hyperlipidemia.  HPI     Past Medical History:  Diagnosis Date  . AAA (abdominal aortic aneurysm) ([place]) 12/15/2014   a. Mild aneurysmal dilatation of the infrarenal abdominal aorta 3.2 cm - f/u due by 2019.  . Arthritis    "all over" (07/30/2016)  . CAD (coronary artery disease)    a. stent to LAD 2000. b. possible spasm by cath 2001. c. IVUS/PTCA/DES to mLAD 05/2013. d. PTCA/DES of dRCA into ostial rPDA 07/2013. e. PTCA of OM2, DES to Cx, DES to [facility] 07/2016.  Marland Kitchen Chronic bronchitis ([place])   . Chronic diastolic CHF (congestive heart failure) ([place])   . Chronic lower back pain   . Chronic neck pain   . CKD (chronic kidney disease), stage III   . Diabetic peripheral neuropathy ([place])   . Ejection fraction    55%, 07/2010, mild inferior hypo  . GERD  (gastroesophageal reflux disease)   . Gout   . H/O diverticulitis of colon 01/31/2015  . H/O hiatal hernia   . History of blood transfusion ~ 10/1940   "had pneumonia"  . History of kidney stones   . HTN (hypertension)   . Hyperlipidemia   . Melanoma of forearm, left ([place]) 02/2011   with wide excision   . Myocardial infarction ([place])   . Nephrolithiasis   . Obesity   . OSA on CPAP     [name] since 2000  . Peripheral neuropathy    both feet  . Positive D-dimer    a. significant elevation ,hospital 07/2010, etiology unclear. b. D Dimer chronically > 20.  Marland Kitchen Renal insufficiency   . Skin cancer   . Spinal stenosis    a. s/p surgical repair 2013  . Spinal stenosis of lumbar region   . Type II diabetes mellitus ([place])   . Ventral hernia     Patient Active Problem List   Diagnosis Date Noted  . Post-operative complication [id number]  . Lyme disease 05/04/2019  . Chronic renal failure, stage 3 (moderate) 11/12/2018  . Chronic diastolic CHF (congestive heart failure) ([place]) 11/12/2018  . Overactive bladder 10/31/2018  . Acute diastolic CHF (congestive heart failure) ([place])   . Chest pain 09/15/2018  . UTI (urinary tract infection) 08/01/2018  . Muscle cramps 06/13/2018  . Urinary incontinence  05/01/2018  . Anemia 11/07/2017  . Constipation 11/07/2017  . Lumbar stenosis with neurogenic claudication 10/22/2017  . Back pain 08/29/2017  . Right-sided low back pain with right-sided sciatica 11/22/2016  . Dyspnea 08/01/2016  . NSTEMI (non-ST elevated myocardial infarction) ([place]) 07/30/2016  . CAD in native artery   . Obesity   . Controlled diabetes mellitus type 2 with complications ([place]) [id number]  . Lumbar radiculopathy 10/09/2015  . Neuropathy, diabetic ([place]) 05/22/2015  . H/O diverticulitis of colon 01/31/2015  . AAA (abdominal aortic aneurysm) ([place]) 01/01/2015  . Sinusitis 10/10/2014  . Right hip pain 07/05/2014  . Palpitations 02/27/2014  . Claudication of lower extremity  ([place]) 10/25/2013  . BPH with obstruction/lower urinary tract symptoms 10/25/2013  . Hypertriglyceridemia 08/15/2012  . Melanoma ([place])   . Dizziness   . Renal insufficiency   . Coronary artery disease with exertional angina ([place])   . Hyperlipidemia   . Essential hypertension   . OSA (obstructive sleep apnea)   . Ejection fraction   . SOB (shortness of breath)   . PARESTHESIA 05/30/2009  . EDEMA 01/22/2009  . OTHER ABNORMAL BLOOD CHEMISTRY 04/25/2008  . Gout 10/21/2007  . Depression 10/21/2007  . VENTRAL HERNIA 10/21/2007  . HIATAL HERNIA 10/21/2007  . FATIGUE 10/21/2007  . SKIN CANCER, HX OF 10/21/2007  . NEPHROLITHIASIS, HX OF 10/21/2007    Past Surgical History:  Procedure Laterality Date  . ANTERIOR LAT LUMBAR FUSION  10/22/2017   Procedure: Lumbar two-three Lumbar three-four Lumbar four-five Anteriolateral lumbar interbody fusion with percutaneous pedicle screw fixation and infuse;  Surgeon: [name], MD;  Location: [facility];  Service: Neurosurgery;;  . APPLICATION OF ROBOTIC ASSISTANCE FOR SPINAL PROCEDURE  10/22/2017   Procedure: APPLICATION OF ROBOTIC ASSISTANCE FOR SPINAL PROCEDURE;  Surgeon: [name], MD;  Location: [facility];  Service: Neurosurgery;;  . BACK SURGERY    . CARDIAC CATHETERIZATION N/A 07/30/2016   Procedure: Left Heart Cath and Coronary Angiography;  Surgeon: Nelva Bush, MD;  Location: [facility] CV LAB;  Service: Cardiovascular;  Laterality: N/A;  . CARDIAC CATHETERIZATION N/A 07/30/2016   Procedure: Coronary Stent Intervention;  Surgeon: Nelva Bush, MD;  Location: [facility];  Service: Cardiovascular;  Laterality: N/A;  Mid CFX and MID RCA  . CARDIAC CATHETERIZATION N/A 07/30/2016   Procedure: Coronary Balloon Angioplasty;  Surgeon: Nelva Bush, MD;  Location: [facility];  Service: Cardiovascular;  Laterality: N/A;  OM 1  . CARPAL TUNNEL RELEASE Left   . CATARACT EXTRACTION W/ INTRAOCULAR LENS  IMPLANT, BILATERAL Bilateral  ~ 2014  . CERVICAL DISC SURGERY  1990s  . CORONARY ANGIOPLASTY WITH STENT PLACEMENT  05/2013  . CORONARY ANGIOPLASTY WITH STENT PLACEMENT  07/26/2013   DES to RCA extending to PDA    . CORONARY ANGIOPLASTY WITH STENT PLACEMENT  07/30/2016  . CORONARY ANGIOPLASTY WITH STENT PLACEMENT  2000   CAD  . DENTAL SURGERY  04/2016   "got infected; had to dig it out"  . EYE SURGERY    . KNEE ARTHROSCOPY Right 1990's   rt  . LEFT AND RIGHT HEART CATHETERIZATION WITH CORONARY ANGIOGRAM N/A 07/26/2013   Procedure: LEFT AND RIGHT HEART CATHETERIZATION WITH CORONARY ANGIOGRAM;  Surgeon: [name], MD;  Location: [facility] CATH LAB;  Service: Cardiovascular;  Laterality: N/A;  . LEFT HEART CATH AND CORONARY ANGIOGRAPHY N/A 02/26/2017   Procedure: Left Heart Cath and Coronary Angiography;  Surgeon: Sherren Mocha, MD;  Location: [facility] CV LAB;  Service: Cardiovascular;  Laterality: N/A;  . LEFT HEART CATHETERIZATION WITH CORONARY ANGIOGRAM N/A 05/19/2013   Procedure: LEFT HEART CATHETERIZATION WITH CORONARY ANGIOGRAM;  Surgeon: [name], MD;  Location: [facility] CATH LAB;  Service: Cardiovascular;  Laterality: N/A;  . LUMBAR LAMINECTOMY/DECOMPRESSION MICRODISCECTOMY  08/30/2012   Procedure: LUMBAR LAMINECTOMY/DECOMPRESSION MICRODISCECTOMY 2 LEVELS;  Surgeon: [name], MD;  Location: [facility] NEURO ORS;  Service: Neurosurgery;  Laterality: Bilateral;  Bilateral Lumbar three-four Lumbar four-five Laminotomies  . LUMBAR PERCUTANEOUS PEDICLE SCREW 1 LEVEL Bilateral 10/22/2017   Procedure: LUMBAR PERCUTANEOUS PEDICLE SCREW 1 LEVEL;  Surgeon: [name], MD;  Location: [facility];  Service: Neurosurgery;  Laterality: Bilateral;  . LUMBAR SPINE SURGERY  04/07/2016   Dr. Ellene Route; "?ruptured disc"  . MELANOMA EXCISION Left 02/2011   forearm  . NASAL SINUS SURGERY  1970s   "cut windows in sinus pockets"  . PERCUTANEOUS CORONARY STENT INTERVENTION (PCI-S)  05/19/2013   Procedure: PERCUTANEOUS CORONARY STENT INTERVENTION  (PCI-S);  Surgeon: [name], MD;  Location: [facility] LAB;  Service: Cardiovascular;;  . ULNAR TUNNEL RELEASE Left 04/07/2016   Dr. Ellene Route       Family History  Problem Relation Age of Onset  . Cancer Mother        intestinal   . Heart disease Mother   . Cancer Father        prostate  . Migraines Daughter   . Leukemia Sister   . Heart disease Sister   . Prostate cancer Other   . Kidney cancer Other   . Cancer Other        Bladder cancer  . Coronary artery disease Other   . Hypertension Sister   . Hypertension Brother   . Heart attack Neg Hx   . Stroke Neg Hx     Social History   Tobacco Use  . Smoking status: Former Smoker    Packs/day: 3.00    Years: 30.00    Pack years: 90.00    Types: Cigarettes    Quit date: 09/17/1996    Years since quitting: 23.3  . Smokeless tobacco: Never Used  Substance Use Topics  . Alcohol use: No  . Drug use: No    Home Medications Prior to Admission medications   Medication Sig Start Date End Date Taking? Authorizing Provider  acetaminophen (TYLENOL) 500 MG tablet Take 500 mg by mouth 2 (two) times daily.     [provider]  allopurinol (ZYLOPRIM) 300 MG tablet TAKE 1 TABLET EVERY DAY Patient taking differently: Take 300 mg by mouth daily.  07/20/19   [name], MD  aspirin EC 81 MG tablet Take 81 mg by mouth daily. 02/23/11   [name], MD  atorvastatin (LIPITOR) 40 MG tablet TAKE 1 TABLET EVERY DAY Patient taking differently: Take 40 mg by mouth daily.  07/20/19   [name], MD  B Complex Vitamins (B COMPLEX-B12 PO) Take 1 tablet by mouth once a week.  07/23/16   [provider]  Cholecalciferol (VITAMIN D3) 25 MCG (1000 UT) CAPS Take 1,000 Units by mouth at bedtime.    [provider]  fenofibrate 160 MG tablet TAKE 1 TABLET (160 MG TOTAL) BY MOUTH AT BEDTIME. 07/20/19   [name], MD  fluocinonide cream (LIDEX) [id number] % Apply 1 application topically daily as needed (for rashes).   08/11/12   [provider]  fluticasone (FLONASE) 50 MCG/ACT nasal spray Place 2 sprays into both nostrils daily. 08/11/17   Saguier, Percell Miller, PA-C  furosemide (LASIX) 40 MG tablet Take 2 tablets (80 mg total) by mouth daily. 12/06/19   Lelon Perla, MD  HYDROcodone-acetaminophen (NORCO) 10-325 MG tablet Take 0.5 tablets by mouth 2 (two) times daily. 08/10/18   [provider]  insulin NPH Human (NOVOLIN N) 100 UNIT/ML injection Inject under skin 45 units in am and 45 in the evening Patient taking differently: Inject 45 Units into the skin 2 (two) times daily before a meal. Inject under skin 45 units in am and 45 in the evening 12/28/19   [name], MD  insulin regular (NOVOLIN R RELION) 100 units/mL injection Inject 25-30 units 3 times a day before meals. Patient taking differently: Inject 25-30 Units into the skin 3 (three) times daily before meals. Inject 25-30 units 3 times a day before meals. 12/28/19   [name], MD  Insulin Syringe-Needle U-100 (BD INSULIN SYRINGE U/F) 31G X 5/16" 0.3 ML MISC Use to inject insulin 5 times a day. Patient taking differently: 1 each by Other route 5 (five) times daily.  11/24/19   [name], MD  isosorbide mononitrate (IMDUR) 30 MG 24 hr tablet TAKE 1 TABLET (30 MG TOTAL) BY MOUTH DAILY. 12/05/19   Lelon Perla, MD  MAGNESIUM PO Take 1 tablet by mouth at bedtime.    [provider]  metoprolol tartrate (LOPRESSOR) 50 MG tablet TAKE 1 TABLET TWICE DAILY 12/22/19   [name], MD  nitroGLYCERIN (NITROSTAT) 0.4 MG SL tablet Place 1 tablet (0.4 mg total) under the tongue every 5 (five) minutes as needed for chest pain. 02/16/18   Lelon Perla, MD  pantoprazole (PROTONIX) 20 MG tablet TAKE 1 TABLET (20 MG TOTAL) BY MOUTH DAILY. 12/08/19   [name], MD  Probiotic Product (PROBIOTIC PO) Take 1 capsule by mouth at bedtime.     [provider]  tiZANidine (ZANAFLEX) 4 MG tablet Take 4 mg by  mouth 2 (two) times daily. 08/12/18   [provider]  TRUE METRIX BLOOD GLUCOSE TEST test strip TEST BLOOD SUGAR THREE TIMES DAILY AS DIRECTED Patient taking differently: 1 each by Other route in the morning, at noon, and at bedtime.  07/28/19   [name], MD  TRUEplus Lancets 33G MISC Use to check blood sugar 3 times a day Patient taking differently: 1 each by Other route in the morning, at noon, and at bedtime. Use to check blood sugar 3 times a day 11/24/19   [name], MD    Allergies    Brilinta [ticagrelor], Insulin detemir, Bee venom, Codeine, Lipitor [atorvastatin], and Novolog mix [insulin aspart prot & aspart]  Review of Systems   Review of Systems  Musculoskeletal: Positive for neck pain.  All other systems reviewed and are negative.   Physical Exam Updated Vital Signs BP (!) 174/69   Pulse 77   Temp 98.3 F (36.8 C) (Oral)   Resp 18   Ht 5\' 10"  (1.778 m)   Wt 120 kg   SpO2 98%   BMI 37.96 kg/m   Physical Exam Vitals and nursing note reviewed.  Constitutional:      General: He is not in acute distress.    Appearance: He is well-developed.  HENT:     Head: Normocephalic and atraumatic.     Comments: Handling secretions easily.  Airway intact.  No signs of respiratory distress Eyes:     Conjunctiva/sclera: Conjunctivae normal.     Pupils: Pupils are equal, round, and reactive to light.  Neck:  Comments: Obvious swelling and contusion of the anterior left side neck surrounding the incision site.  No induration or purulent drainage.  No signs of infection. Cardiovascular:     Rate and Rhythm: Normal rate and regular rhythm.     Pulses: Normal pulses.  Pulmonary:     Effort: Pulmonary effort is normal. No respiratory distress.     Breath sounds: Normal breath sounds. No wheezing.  Abdominal:     General: There is no distension.     Palpations: Abdomen is soft. There is no mass.     Tenderness: There is no abdominal tenderness.  There is no guarding or rebound.  Musculoskeletal:        General: Normal range of motion.     Cervical back: Normal range of motion and neck supple.  Skin:    General: Skin is warm and dry.  Neurological:     Mental Status: He is alert and oriented to person, place, and time.     ED Results / Procedures / Treatments   Labs (all labs ordered are listed, but only abnormal results are displayed) Labs Reviewed  CBC WITH DIFFERENTIAL/PLATELET - Abnormal; Notable for the following components:      Result Value   RBC 4.09 (*)    Hemoglobin 12.4 (*)    HCT 38.1 (*)    All other components within normal limits  COMPREHENSIVE METABOLIC PANEL - Abnormal; Notable for the following components:   Chloride 97 (*)    Glucose, Bld 444 (*)    BUN 25 (*)    Creatinine, Ser 1.58 (*)    GFR calc non Af Amer 41 (*)    GFR calc Af Amer 48 (*)    All other components within normal limits    EKG None  Radiology DG Neck Soft Tissue  Result Date: 01/09/2020 CLINICAL DATA:  Pain and swelling at incision site history of anterior cervical fusion pain with swallowing EXAM: NECK SOFT TISSUES - 1+ VIEW COMPARISON:  12/23/2019, 01/08/2020 FINDINGS: Anterior plate and fixating screws C3 through C5 with interbody device at C3-C4 and C4-C5. Slight enlargement of prevertebral soft tissues at the level of surgical hardware presumably due to postoperative edema. Gas and fluid levels anterior to the thyroid cartilage. Edema within the subcutaneous soft tissues. IMPRESSION: 1. Stranding and fluid levels within the superficial anterior neck soft tissues at the level of the thyroid, this would be better evaluated with CT 2. Postsurgical changes C3 through C5. Slight enlargement of prevertebral soft tissues at this level, likely postoperative edema. Electronically Signed   By: Donavan Foil M.D.   On: 01/09/2020 20:10   CT Soft Tissue Neck W Contrast  Result Date: 01/09/2020 CLINICAL DATA:  Neck pain and swelling after  surgery. EXAM: CT NECK WITH CONTRAST TECHNIQUE: Multidetector CT imaging of the neck was performed using the standard protocol following the bolus administration of intravenous contrast. CONTRAST:  48mL OMNIPAQUE IOHEXOL 300 MG/ML  SOLN COMPARISON:  None. FINDINGS: Pharynx and larynx: There is a retropharyngeal effusion that extends from C2 to C5. The effusion measures 8 mm in thickness. There is mild mass effect on the supraglottic larynx. Normal pharynx. Salivary glands: No inflammation, mass, or stone. Thyroid: Normal. Lymph nodes: None enlarged or abnormal density. Vascular: Bilateral carotid bifurcation calcific atherosclerosis. Limited intracranial: Negative. Visualized orbits: Negative. Mastoids and visualized paranasal sinuses: Clear. Skeleton: C3-5 ACDF. Upper chest: Negative. Other: Areas of gas tracking along the soft tissues of the left anterior neck, postsurgical. IMPRESSION: 1. An  8 mm postoperative retropharyngeal effusion extending from C2 to C5 with mild mass effect on the supraglottic larynx. 2. C3-5 ACDF with expected postoperative appearance of the soft tissues of the left anterior neck. Electronically Signed   By: Ulyses Jarred M.D.   On: 01/09/2020 22:30    Procedures Procedures (including critical care time)  Medications Ordered in ED Medications  insulin aspart (novoLOG) injection 0-15 Units (has no administration in time range)  insulin NPH Human (NOVOLIN N) injection 20 Units (has no administration in time range)  dexamethasone (DECADRON) injection 10 mg (10 mg Intravenous Given 01/09/20 2141)  morphine 4 MG/ML injection 4 mg (4 mg Intravenous Given 01/09/20 2141)  iohexol (OMNIPAQUE) 300 MG/ML solution 75 mL (75 mLs Intravenous Contrast Given 01/09/20 2218)  fentaNYL (SUBLIMAZE) injection 50 mcg (50 mcg Intravenous Given 01/09/20 2242)    ED Course  I have reviewed the triage vital signs and the nursing notes.  Pertinent labs & imaging results that were available during my  care of the patient were reviewed by me and considered in my medical decision making (see chart for details).    MDM Rules/Calculators/A&P                      Patient presenting for evaluation of pain and swelling of his anterior neck after anterior approach cervical fusion yesterday.  On exam, patient is without signs of airway compromise or respiratory distress.  However, I am concerned about his inability to swallow liquids or his medications.  X-ray obtained from triage viewed and interpreted by me, shows air and stranding of the left anterior neck.  Recommend CT.  Labs obtained from triage interpreted by me, overall reassuring.  No leukocytosis.  Hemoglobin stable.  Patient is mildly hyperglycemic at 444, but has been unable to take his medicines today, likely contributing to his hyperglycemia.  Will obtain CT and consult with neurosurgery for further management.  Decadron and morphine given for symptom control.  CT shows a postoperative retropharyngeal effusion with mild mass-effect on the larynx.  Case discussed with Dr. Vertell Limber from neurosurgery who evaluated the patient, and will meet the patient.  Requesting consultation from hospitalist service for assistance with diabetic management.  Discussed with Dr. Marice Potter from [facility] hospitalist service, who agrees to consult.  Final Clinical Impression(s) / ED Diagnoses Final diagnoses:  Neck swelling    Rx / DC Orders ED Discharge Orders    None       Franchot Heidelberg, PA-C 01/09/20 2336    Noemi Chapel, MD 01/10/20 1200

## 2020-01-09 NOTE — ED Provider Notes (Signed)
This patient is a 79 year old male presenting with increasing swelling of his neck after having an anterior approach cervical 3 yesterday.  He reports having some changes in his voice, some difficulty with swallowing, on exam the patient is able to fully open and close his mouth, he has a tender area of swelling just left of midline.  Needs NS evaluation - CT soft tissue neck ordered  Dr. Vertell Limber has admitted pt from ED, Appreciate his caring for this very nice patient  Medical screening examination/treatment/procedure(s) were conducted as a shared visit with non-physician practitioner(s) and myself.  I personally evaluated the patient during the encounter.  Clinical Impression:   Final diagnoses:  Neck swelling          Noemi Chapel, MD 01/10/20 1159

## 2020-01-10 DIAGNOSIS — Z961 Presence of intraocular lens: Secondary | ICD-10-CM | POA: Diagnosis present

## 2020-01-10 DIAGNOSIS — I130 Hypertensive heart and chronic kidney disease with heart failure and stage 1 through stage 4 chronic kidney disease, or unspecified chronic kidney disease: Secondary | ICD-10-CM | POA: Diagnosis present

## 2020-01-10 DIAGNOSIS — E1165 Type 2 diabetes mellitus with hyperglycemia: Secondary | ICD-10-CM | POA: Diagnosis present

## 2020-01-10 DIAGNOSIS — E118 Type 2 diabetes mellitus with unspecified complications: Secondary | ICD-10-CM

## 2020-01-10 DIAGNOSIS — Z6837 Body mass index (BMI) 37.0-37.9, adult: Secondary | ICD-10-CM | POA: Diagnosis not present

## 2020-01-10 DIAGNOSIS — G8929 Other chronic pain: Secondary | ICD-10-CM | POA: Diagnosis present

## 2020-01-10 DIAGNOSIS — Z87442 Personal history of urinary calculi: Secondary | ICD-10-CM | POA: Diagnosis not present

## 2020-01-10 DIAGNOSIS — E1122 Type 2 diabetes mellitus with diabetic chronic kidney disease: Secondary | ICD-10-CM | POA: Diagnosis present

## 2020-01-10 DIAGNOSIS — Z9842 Cataract extraction status, left eye: Secondary | ICD-10-CM | POA: Diagnosis not present

## 2020-01-10 DIAGNOSIS — J449 Chronic obstructive pulmonary disease, unspecified: Secondary | ICD-10-CM | POA: Diagnosis present

## 2020-01-10 DIAGNOSIS — R1310 Dysphagia, unspecified: Secondary | ICD-10-CM | POA: Diagnosis present

## 2020-01-10 DIAGNOSIS — E785 Hyperlipidemia, unspecified: Secondary | ICD-10-CM | POA: Diagnosis present

## 2020-01-10 DIAGNOSIS — I252 Old myocardial infarction: Secondary | ICD-10-CM | POA: Diagnosis not present

## 2020-01-10 DIAGNOSIS — I2510 Atherosclerotic heart disease of native coronary artery without angina pectoris: Secondary | ICD-10-CM | POA: Diagnosis present

## 2020-01-10 DIAGNOSIS — Y838 Other surgical procedures as the cause of abnormal reaction of the patient, or of later complication, without mention of misadventure at the time of the procedure: Secondary | ICD-10-CM | POA: Diagnosis present

## 2020-01-10 DIAGNOSIS — R221 Localized swelling, mass and lump, neck: Secondary | ICD-10-CM | POA: Diagnosis not present

## 2020-01-10 DIAGNOSIS — M1990 Unspecified osteoarthritis, unspecified site: Secondary | ICD-10-CM | POA: Diagnosis present

## 2020-01-10 DIAGNOSIS — Z955 Presence of coronary angioplasty implant and graft: Secondary | ICD-10-CM | POA: Diagnosis not present

## 2020-01-10 DIAGNOSIS — Z794 Long term (current) use of insulin: Secondary | ICD-10-CM

## 2020-01-10 DIAGNOSIS — T8189XA Other complications of procedures, not elsewhere classified, initial encounter: Secondary | ICD-10-CM | POA: Diagnosis present

## 2020-01-10 DIAGNOSIS — N1832 Chronic kidney disease, stage 3b: Secondary | ICD-10-CM | POA: Diagnosis present

## 2020-01-10 DIAGNOSIS — Z85820 Personal history of malignant melanoma of skin: Secondary | ICD-10-CM | POA: Diagnosis not present

## 2020-01-10 DIAGNOSIS — I5032 Chronic diastolic (congestive) heart failure: Secondary | ICD-10-CM | POA: Diagnosis present

## 2020-01-10 DIAGNOSIS — G4733 Obstructive sleep apnea (adult) (pediatric): Secondary | ICD-10-CM | POA: Diagnosis present

## 2020-01-10 DIAGNOSIS — E669 Obesity, unspecified: Secondary | ICD-10-CM | POA: Diagnosis present

## 2020-01-10 DIAGNOSIS — K219 Gastro-esophageal reflux disease without esophagitis: Secondary | ICD-10-CM | POA: Diagnosis present

## 2020-01-10 DIAGNOSIS — Z9841 Cataract extraction status, right eye: Secondary | ICD-10-CM | POA: Diagnosis not present

## 2020-01-10 DIAGNOSIS — E1142 Type 2 diabetes mellitus with diabetic polyneuropathy: Secondary | ICD-10-CM | POA: Diagnosis present

## 2020-01-10 LAB — CBG MONITORING, ED
Glucose-Capillary: 319 mg/dL — ABNORMAL HIGH (ref 70–99)
Glucose-Capillary: 321 mg/dL — ABNORMAL HIGH (ref 70–99)
Glucose-Capillary: 383 mg/dL — ABNORMAL HIGH (ref 70–99)
Glucose-Capillary: 386 mg/dL — ABNORMAL HIGH (ref 70–99)
Glucose-Capillary: 392 mg/dL — ABNORMAL HIGH (ref 70–99)
Glucose-Capillary: 394 mg/dL — ABNORMAL HIGH (ref 70–99)
Glucose-Capillary: 438 mg/dL — ABNORMAL HIGH (ref 70–99)

## 2020-01-10 LAB — GLUCOSE, CAPILLARY
Glucose-Capillary: 400 mg/dL — ABNORMAL HIGH (ref 70–99)
Glucose-Capillary: 307 mg/dL — ABNORMAL HIGH (ref 70–99)

## 2020-01-10 LAB — MRSA PCR SCREENING: MRSA by PCR: NEGATIVE

## 2020-01-10 MED ORDER — ATORVASTATIN CALCIUM 40 MG PO TABS
40.0000 mg | ORAL_TABLET | Freq: Every day | ORAL | Status: DC
  Administered 2020-01-10 – 2020-01-12 (×3): 40 mg via ORAL
  Filled 2020-01-10 (×3): qty 1

## 2020-01-10 MED ORDER — PHENOL 1.4 % MT LIQD
1.0000 | OROMUCOSAL | Status: DC | PRN
  Filled 2020-01-10: qty 177

## 2020-01-10 MED ORDER — INSULIN REGULAR HUMAN 100 UNIT/ML IJ SOLN
0.0000 [IU] | Freq: Every day | INTRAMUSCULAR | Status: DC
  Administered 2020-01-10: 22:00:00 4 [IU] via SUBCUTANEOUS
  Administered 2020-01-11: 5 [IU] via SUBCUTANEOUS
  Filled 2020-01-10 (×2): qty 3

## 2020-01-10 MED ORDER — SODIUM CHLORIDE 0.9 % IV SOLN: 250.0000 mL | INTRAVENOUS | Status: DC

## 2020-01-10 MED ORDER — INSULIN ASPART 100 UNIT/ML ~~LOC~~ SOLN: 0.0000 [IU] | Freq: Every day | SUBCUTANEOUS | Status: DC

## 2020-01-10 MED ORDER — DOCUSATE SODIUM 100 MG PO CAPS
100.0000 mg | ORAL_CAPSULE | Freq: Two times a day (BID) | ORAL | Status: DC
  Administered 2020-01-10 – 2020-01-12 (×6): 100 mg via ORAL
  Filled 2020-01-10 (×6): qty 1

## 2020-01-10 MED ORDER — TIZANIDINE HCL 4 MG PO TABS
4.0000 mg | ORAL_TABLET | Freq: Two times a day (BID) | ORAL | Status: DC
  Administered 2020-01-10 – 2020-01-12 (×6): 4 mg via ORAL
  Filled 2020-01-10 (×7): qty 1

## 2020-01-10 MED ORDER — ASPIRIN EC 81 MG PO TBEC
81.0000 mg | DELAYED_RELEASE_TABLET | Freq: Every day | ORAL | Status: DC
  Administered 2020-01-10 – 2020-01-12 (×3): 81 mg via ORAL
  Filled 2020-01-10 (×3): qty 1

## 2020-01-10 MED ORDER — ONDANSETRON HCL 4 MG PO TABS: 4.0000 mg | ORAL_TABLET | Freq: Four times a day (QID) | ORAL | Status: DC | PRN

## 2020-01-10 MED ORDER — KCL IN DEXTROSE-NACL 20-5-0.45 MEQ/L-%-% IV SOLN
INTRAVENOUS | Status: DC
  Filled 2020-01-10: qty 1000

## 2020-01-10 MED ORDER — MAGNESIUM OXIDE 400 (241.3 MG) MG PO TABS
200.0000 mg | ORAL_TABLET | Freq: Every day | ORAL | Status: DC
  Administered 2020-01-10 – 2020-01-11 (×2): 200 mg via ORAL
  Filled 2020-01-10 (×2): qty 1

## 2020-01-10 MED ORDER — NITROGLYCERIN 0.4 MG SL SUBL: 0.4000 mg | SUBLINGUAL_TABLET | SUBLINGUAL | Status: DC | PRN

## 2020-01-10 MED ORDER — HYDROCODONE-ACETAMINOPHEN 5-325 MG PO TABS: 1.0000 | ORAL_TABLET | ORAL | Status: DC | PRN

## 2020-01-10 MED ORDER — INSULIN REGULAR HUMAN 100 UNIT/ML IJ SOLN
10.0000 [IU] | Freq: Once | INTRAMUSCULAR | Status: AC
  Administered 2020-01-10: 10 [IU] via SUBCUTANEOUS
  Filled 2020-01-10: qty 10
  Filled 2020-01-10: qty 3

## 2020-01-10 MED ORDER — HYDROMORPHONE HCL 1 MG/ML IJ SOLN: 0.5000 mg | INTRAMUSCULAR | Status: DC | PRN

## 2020-01-10 MED ORDER — SODIUM CHLORIDE 0.9% FLUSH
3.0000 mL | Freq: Two times a day (BID) | INTRAVENOUS | Status: DC
  Administered 2020-01-10 – 2020-01-12 (×5): 3 mL via INTRAVENOUS

## 2020-01-10 MED ORDER — METHOCARBAMOL 500 MG PO TABS
500.0000 mg | ORAL_TABLET | Freq: Four times a day (QID) | ORAL | Status: DC | PRN
  Administered 2020-01-10 – 2020-01-11 (×3): 500 mg via ORAL
  Filled 2020-01-10 (×3): qty 1

## 2020-01-10 MED ORDER — BISACODYL 10 MG RE SUPP: 10.0000 mg | Freq: Every day | RECTAL | Status: DC | PRN

## 2020-01-10 MED ORDER — ACETAMINOPHEN 650 MG RE SUPP: 650.0000 mg | RECTAL | Status: DC | PRN

## 2020-01-10 MED ORDER — ISOSORBIDE MONONITRATE ER 30 MG PO TB24
30.0000 mg | ORAL_TABLET | Freq: Every day | ORAL | Status: DC
  Administered 2020-01-10 – 2020-01-12 (×3): 30 mg via ORAL
  Filled 2020-01-10 (×3): qty 1

## 2020-01-10 MED ORDER — METHOCARBAMOL 1000 MG/10ML IJ SOLN
500.0000 mg | Freq: Four times a day (QID) | INTRAVENOUS | Status: DC | PRN
  Filled 2020-01-10: qty 5

## 2020-01-10 MED ORDER — PANTOPRAZOLE SODIUM 40 MG IV SOLR
40.0000 mg | Freq: Every day | INTRAVENOUS | Status: DC
  Administered 2020-01-10 – 2020-01-11 (×3): 40 mg via INTRAVENOUS
  Filled 2020-01-10 (×3): qty 40

## 2020-01-10 MED ORDER — FLEET ENEMA 7-19 GM/118ML RE ENEM: 1.0000 | ENEMA | Freq: Once | RECTAL | Status: DC | PRN

## 2020-01-10 MED ORDER — DEXAMETHASONE 4 MG PO TABS
4.0000 mg | ORAL_TABLET | Freq: Four times a day (QID) | ORAL | Status: DC
  Administered 2020-01-10 – 2020-01-11 (×2): 4 mg via ORAL
  Filled 2020-01-10 (×2): qty 1

## 2020-01-10 MED ORDER — FUROSEMIDE 80 MG PO TABS
80.0000 mg | ORAL_TABLET | Freq: Every day | ORAL | Status: DC
  Administered 2020-01-10 – 2020-01-12 (×3): 80 mg via ORAL
  Filled 2020-01-10: qty 1
  Filled 2020-01-10: qty 4
  Filled 2020-01-10: qty 1

## 2020-01-10 MED ORDER — VITAMIN D 25 MCG (1000 UNIT) PO TABS
1000.0000 [IU] | ORAL_TABLET | Freq: Every day | ORAL | Status: DC
  Administered 2020-01-10 – 2020-01-11 (×2): 1000 [IU] via ORAL
  Filled 2020-01-10 (×2): qty 1

## 2020-01-10 MED ORDER — INSULIN ASPART 100 UNIT/ML ~~LOC~~ SOLN: 0.0000 [IU] | Freq: Three times a day (TID) | SUBCUTANEOUS | Status: DC

## 2020-01-10 MED ORDER — ACETAMINOPHEN 325 MG PO TABS: 650.0000 mg | ORAL_TABLET | ORAL | Status: DC | PRN

## 2020-01-10 MED ORDER — ONDANSETRON HCL 4 MG/2ML IJ SOLN: 4.0000 mg | Freq: Four times a day (QID) | INTRAMUSCULAR | Status: DC | PRN

## 2020-01-10 MED ORDER — FENOFIBRATE 160 MG PO TABS
160.0000 mg | ORAL_TABLET | Freq: Every day | ORAL | Status: DC
  Administered 2020-01-10 – 2020-01-11 (×2): 160 mg via ORAL
  Filled 2020-01-10 (×2): qty 1

## 2020-01-10 MED ORDER — DEXAMETHASONE SODIUM PHOSPHATE 4 MG/ML IJ SOLN
4.0000 mg | Freq: Four times a day (QID) | INTRAMUSCULAR | Status: DC
  Administered 2020-01-11 (×3): 4 mg via INTRAVENOUS
  Filled 2020-01-10 (×4): qty 1

## 2020-01-10 MED ORDER — PANTOPRAZOLE SODIUM 20 MG PO TBEC: 20.0000 mg | DELAYED_RELEASE_TABLET | Freq: Every day | ORAL | Status: DC

## 2020-01-10 MED ORDER — ALLOPURINOL 300 MG PO TABS
300.0000 mg | ORAL_TABLET | Freq: Every day | ORAL | Status: DC
  Administered 2020-01-10 – 2020-01-12 (×3): 300 mg via ORAL
  Filled 2020-01-10: qty 3
  Filled 2020-01-10 (×3): qty 1

## 2020-01-10 MED ORDER — HYDROCODONE-ACETAMINOPHEN 10-325 MG PO TABS
0.5000 | ORAL_TABLET | Freq: Two times a day (BID) | ORAL | Status: DC
  Administered 2020-01-10 – 2020-01-12 (×6): 0.5 via ORAL
  Filled 2020-01-10 (×6): qty 1

## 2020-01-10 MED ORDER — INSULIN REGULAR HUMAN 100 UNIT/ML IJ SOLN
0.0000 [IU] | Freq: Three times a day (TID) | INTRAMUSCULAR | Status: DC
  Administered 2020-01-10 (×2): 15 [IU] via SUBCUTANEOUS
  Administered 2020-01-11: 08:00:00 3 [IU] via SUBCUTANEOUS
  Administered 2020-01-11: 8 [IU] via SUBCUTANEOUS
  Administered 2020-01-11: 13:00:00 11 [IU] via SUBCUTANEOUS
  Administered 2020-01-12: 15 [IU] via SUBCUTANEOUS
  Administered 2020-01-12: 5 [IU] via SUBCUTANEOUS
  Filled 2020-01-10: qty 3

## 2020-01-10 MED ORDER — ALUM & MAG HYDROXIDE-SIMETH 200-200-20 MG/5ML PO SUSP: 30.0000 mL | Freq: Four times a day (QID) | ORAL | Status: DC | PRN

## 2020-01-10 MED ORDER — FLUOCINONIDE 0.05 % EX CREA: 1.0000 "application " | TOPICAL_CREAM | Freq: Every day | CUTANEOUS | Status: DC | PRN

## 2020-01-10 MED ORDER — OXYCODONE HCL 5 MG PO TABS
10.0000 mg | ORAL_TABLET | ORAL | Status: DC | PRN
  Administered 2020-01-10 – 2020-01-12 (×4): 10 mg via ORAL
  Filled 2020-01-10 (×4): qty 2

## 2020-01-10 MED ORDER — MENTHOL 3 MG MT LOZG
1.0000 | LOZENGE | OROMUCOSAL | Status: DC | PRN
  Filled 2020-01-10: qty 9

## 2020-01-10 MED ORDER — FLUTICASONE PROPIONATE 50 MCG/ACT NA SUSP
2.0000 | Freq: Every day | NASAL | Status: DC
  Administered 2020-01-12: 2 via NASAL
  Filled 2020-01-10: qty 16

## 2020-01-10 MED ORDER — ACETAMINOPHEN 500 MG PO TABS
500.0000 mg | ORAL_TABLET | Freq: Two times a day (BID) | ORAL | Status: DC
  Administered 2020-01-10 – 2020-01-12 (×6): 500 mg via ORAL
  Filled 2020-01-10 (×6): qty 1

## 2020-01-10 MED ORDER — METOPROLOL TARTRATE 50 MG PO TABS
50.0000 mg | ORAL_TABLET | Freq: Two times a day (BID) | ORAL | Status: DC
  Administered 2020-01-10 – 2020-01-12 (×6): 50 mg via ORAL
  Filled 2020-01-10 (×2): qty 1
  Filled 2020-01-10 (×2): qty 2
  Filled 2020-01-10 (×2): qty 1

## 2020-01-10 MED ORDER — POLYETHYLENE GLYCOL 3350 17 G PO PACK: 17.0000 g | PACK | Freq: Every day | ORAL | Status: DC | PRN

## 2020-01-10 MED ORDER — INSULIN NPH (HUMAN) (ISOPHANE) 100 UNIT/ML ~~LOC~~ SUSP
45.0000 [IU] | Freq: Two times a day (BID) | SUBCUTANEOUS | Status: DC
  Administered 2020-01-10 – 2020-01-11 (×3): 45 [IU] via SUBCUTANEOUS
  Filled 2020-01-10: qty 10

## 2020-01-10 MED ORDER — B COMPLEX-B12 PO TABS: ORAL_TABLET | ORAL | Status: DC

## 2020-01-10 MED ORDER — SODIUM CHLORIDE 0.9% FLUSH: 3.0000 mL | INTRAVENOUS | Status: DC | PRN

## 2020-01-10 NOTE — Progress Notes (Signed)
01/10/20 1407  PT Visit Information  Last PT Received On 01/10/20  Assistance Needed +1  PT/OT/SLP Co-Evaluation/Treatment Yes  Reason for Co-Treatment For patient/therapist safety;To address functional/ADL transfers  PT goals addressed during session Mobility/safety with mobility;Balance  History of Present Illness 79 yo male admitted to ED due to neck swelling and dysphagia. Pt s/p ACDF C3-4 C4-5 Dr. Ellene Route 01/08/20. PMH: AAA CHF COPD CKD DM HTN HLD Neuropathy Back surgery 2019 R knee surg 1990s  Precautions  Precautions Cervical  Precaution Booklet Issued Yes (comment)  Precaution Comments provided hand out with cervical precautions and fine motor. Code for medbridge is [id number]  Restrictions  Weight Bearing Restrictions No  Home Living  Family/patient expects to be discharged to: Private residence  Living Arrangements Spouse/significant other  Available Help at Discharge Family  Type of [place] to enter  Entrance Stairs-Number of Steps 1  [place] One level  Bathroom Shower/Tub Walk-in shower  [place] - 2 wheels;Cane - single point;Shower seat - built in;Grab bars - toilet;Hand held shower head;Grab bars - tub/shower  Additional Comments reports wife is "not in good shape"  Prior Function  Level of Independence Independent  Comments reports indep but when asked more detailed questions expressing limitation to most adls due to neuropathy or hip pain or hand functional deficits . but when directly ask states "i can do it"  Communication  Communication No difficulties  Pain Assessment  Pain Assessment 0-10  Pain Score 4  Pain Location neck  Pain Descriptors / Indicators Discomfort;Operative site guarding  Pain Intervention(s) Limited activity within patient's tolerance;Monitored during session;Repositioned  Cognition  Arousal/Alertness Awake/alert  Behavior During Therapy  [facility] for tasks assessed/performed;Agitated  Overall Cognitive Status No family/caregiver present to determine baseline cognitive functioning  General Comments Agitated at end of session and stating "I don't need you" when referring to therapy services. Educated about precautions, however, reports he is not going to follow.   Upper Extremity Assessment  Upper Extremity Assessment Defer to OT evaluation  Lower Extremity Assessment  Lower Extremity Assessment Generalized weakness;RLE deficits/detail;LLE deficits/detail  RLE Deficits / Details Reports R hip pain with longer distance mobility.   LLE Deficits / Details Reports R hip pain with longer distance mobility.   Cervical / Trunk Assessment  Cervical / Trunk Assessment Other exceptions  Cervical / Trunk Exceptions previous cervical surgery  Bed Mobility  General bed mobility comments sitting EOB on arrival. pt declines any attempts at bed mobility. pt states "i can lay flat if i want to " pt denies any troubl with task but then when asked directly about laying flat after surgery states "well of course i cant i had this clot in my neck"  Transfers  Overall transfer level Needs assistance  Equipment used None  Transfers Sit to/from Stand  Sit to Stand Min guard  General transfer comment pt requires (A) at times and describes inability to walk to mailbox and back at baseline  Ambulation/Gait  Ambulation/Gait assistance Min guard  Gait Distance (Feet) 70 Feet  Assistive device None  Gait Pattern/deviations Step-through pattern;Decreased stride length;Wide base of support  General Gait Details Waddle type gait. Reports hip pain at baseline and describes difficulty getting to/from mailbox. Mild unsteadiness noted. Educated about walking program to perform at home.   Gait velocity Decreased  Balance  Overall balance assessment Needs assistance  Sitting-balance support No upper extremity supported;Feet supported  Sitting  balance-Leahy Scale  Good  Standing balance support No upper extremity supported;Single extremity supported  Standing balance-Leahy Scale Poor  Standing balance comment L LE standing single leg mod (A)  General Comments  General comments (skin integrity, edema, etc.) pt advised not to touch wound due to risk for infection and educated on bathing with clean linen each time bathing. pt states okay and then continued to touch incision x3 during session after education. pt reaching for incision location everytime asked or discussing the site.   PT - End of Session  Activity Tolerance Patient tolerated treatment well;Treatment limited secondary to agitation  Patient left in bed;with call bell/phone within reach (sitting EOB )  Nurse Communication Mobility status  PT Assessment  PT Recommendation/Assessment Patient needs continued PT services  PT Visit Diagnosis Difficulty in walking, not elsewhere classified (R26.2);Pain  Pain - part of body  (neck and bilateral hips )  PT Problem List Decreased strength;Decreased balance;Decreased safety awareness;Decreased knowledge of precautions;Decreased cognition;Decreased mobility  PT Plan  PT Frequency (ACUTE ONLY) Min 3X/week  PT Treatment/Interventions (ACUTE ONLY) Gait training;Stair training;Functional mobility training;Therapeutic activities;Therapeutic exercise;Balance training;Patient/family education  AM-PAC PT "6 Clicks" Mobility Outcome Measure (Version 2)  Help needed turning from your back to your side while in a flat bed without using bedrails? 3  Help needed moving from lying on your back to sitting on the side of a flat bed without using bedrails? 3  Help needed moving to and from a bed to a chair (including a wheelchair)? 3  Help needed standing up from a chair using your arms (e.g., wheelchair or bedside chair)? 3  Help needed to walk in hospital room? 3  Help needed climbing 3-5 steps with a railing?  2  6 Click Score 17  Consider Recommendation of  Discharge To: Home with [facility]  PT Recommendation  Follow Up Recommendations No PT follow up  PT equipment None recommended by PT  Individuals Consulted  Consulted and Agree with Results and Recommendations Patient  Acute Rehab PT Goals  Patient Stated Goal to not buy any of that junk - pt referring to AE / DME and having therapist  PT Goal Formulation With patient  Time For Goal Achievement 01/24/20  Potential to Achieve Goals Fair  PT Time Calculation  PT Start Time (ACUTE ONLY) 1017  PT Stop Time (ACUTE ONLY) 1051  PT Time Calculation (min) (ACUTE ONLY) 34 min  PT General Charges  $$ ACUTE PT VISIT 1 Visit  PT Evaluation  $PT Eval Moderate Complexity 1 Mod  Written Expression  Dominant Hand Right   Pt admitted secondary to problem above with deficits above. Pt requiring min guard A for mobility tasks and mod A for balance to perform SLS. Pt reports bilateral hip pain and baseline and difficulty ambulating to/from mailbox. Pt expressed dislike for therapy at end of session and "I know my neurosurgeon did not order you." Pt educated on cervical precautions, provided handouts, educated on proper bathing to decr risk of infection and fall risk with single leg standing. Pt was initially receptive to education and then with cognitive challenges became very irritated and raised voice tone. Pt reports "I am going to do what I want to do" and "I have taken care of myself for 79 years I don't need that or you." Feel pt would continue to benefit from therapy services to address deficits despite dislike of therapy. Will continue to follow acutely to maximize functional mobility independence and safety.   Reuel Derby, PT,  DPT  Acute Rehabilitation Services  Pager: [phone] Office: [phone]

## 2020-01-10 NOTE — Progress Notes (Signed)
PROGRESS NOTE    William Hendrix  [id number] DOB: Mar 28, 1941 DOA: 01/09/2020 PCP: [name], MD   Brief Narrative:  79 yo M with PMH of insulin-dependent T2DM, CAD, CKD who presented to ER for post-op complication after cervical decompression and fusion surgery yesterday. Neurosurgery admitting. Consulted for DM management per EDP.   Assessment & Plan:   Active Problems:   Controlled diabetes mellitus type 2 with complications (HCC)   Post-operative complication   Dysphagia  8 mm postoperative retropharyngeal effusion/blood clot extending from 99991111: Postop complication.  Managed by primary service/neurosurgery.  Type 2 diabetes mellitus: Patient's blood sugar remains significantly elevated over 400.  Was seen by primary service/neurosurgery this morning.  They ordered diabetic coordinator.  Patient's NPH insulin was increased from 20units to 45 units twice daily which is his home regimen.  He is also started on sliding scale insulin.  We will make no further changes.  He is on Decadron so there is a chance that he might still be having uncontrolled hyperglycemia.  We will manage accordingly.  Hyperlipidemia: Continue home dose of atorvastatin.  Essential hypertension: Controlled.  Continue home medications.  DVT prophylaxis: Per primary service   Code Status: Full Code  Family Communication:  None present at bedside.  Plan of care discussed with patient in length and he verbalized understanding and agreed with it. Patient is from: Home Disposition Plan: Per primary service.  Per patient, primary service plans to discharge him if he tolerates the diet. Barriers to discharge: Diet tolerance   Estimated body mass index is 37.96 kg/m as calculated from the following:   Height as of this encounter: 5\' 10"  (1.778 m).   Weight as of this encounter: 120 kg.  Pressure Injury 09/15/18 Deep Tissue Injury - Purple or maroon localized area of discolored intact skin or blood-filled  blister due to damage of underlying soft tissue from pressure and/or shear. Assessed with Cyril Mourning, RN (Active)  09/15/18 1145  Location: Buttocks  Location Orientation: Left  Staging: Deep Tissue Injury - Purple or maroon localized area of discolored intact skin or blood-filled blister due to damage of underlying soft tissue from pressure and/or shear.  Wound Description (Comments): Assessed with Cyril Mourning, RN  Present on Admission: Yes     Nutritional status:               Consultants:   Hospitalist  Procedures:   None  Antimicrobials:   None   Subjective: Patient seen and examined in the ED.  He is alert and oriented.  Complains of very minimal neck pain but he states that he feels much better.  Has no complaints.  He is about to try diet.  Hopes to go home today.  Objective: Vitals:   01/10/20 0800 01/10/20 0815 01/10/20 0915 01/10/20 1000  BP: (!) 127/59 (!) 129/55 (!) 125/59 124/67  Pulse: 64 64 64 69  Resp: 17     Temp:      TempSrc:      SpO2: 96% 92% 96% 95%  Weight:      Height:       No intake or output data in the 24 hours ending 01/10/20 1106 Filed Weights   01/09/20 1916  Weight: 120 kg    Examination:  General exam: Appears calm and comfortable  Respiratory system: Clear to auscultation. Respiratory effort normal. Cardiovascular system: S1 & S2 heard, RRR. No JVD, murmurs, rubs, gallops or clicks. No pedal edema. Gastrointestinal system: Abdomen is nondistended, soft and nontender.  No organomegaly or masses felt. Normal bowel sounds heard. Central nervous system: Alert and oriented. No focal neurological deficits. Extremities: Symmetric 5 x 5 power. Skin: No rashes, lesions or ulcers Psychiatry: Judgement and insight appear normal. Mood & affect appropriate.    Data Reviewed: I have personally reviewed following labs and imaging studies  CBC: Recent Labs  Lab 01/09/20 1953  WBC 8.2  NEUTROABS 5.4  HGB 12.4*  HCT 38.1*  MCV 93.2   PLT [id number]   Basic Metabolic Panel: Recent Labs  Lab 01/09/20 1953  NA 136  K 4.5  CL 97*  CO2 28  GLUCOSE 444*  BUN 25*  CREATININE 1.58*  CALCIUM 9.5   GFR: Estimated Creatinine Clearance: 49.2 mL/min (A) (by C-G formula based on SCr of 1.58 mg/dL (H)). Liver Function Tests: Recent Labs  Lab 01/09/20 1953  AST 23  ALT 19  ALKPHOS 51  BILITOT 0.5  PROT 6.5  ALBUMIN 3.8   No results for input(s): LIPASE, AMYLASE in the last 168 hours. No results for input(s): AMMONIA in the last 168 hours. Coagulation Profile: No results for input(s): INR, PROTIME in the last 168 hours. Cardiac Enzymes: No results for input(s): CKTOTAL, CKMB, CKMBINDEX, TROPONINI in the last 168 hours. BNP (last 3 results) Recent Labs    12/13/19 1150  PROBNP 131   HbA1C: No results for input(s): HGBA1C in the last 72 hours. CBG: Recent Labs  Lab 01/10/20 0036 01/10/20 0204 01/10/20 0515 01/10/20 0812  GLUCAP 321* 319* 394* 438*   Lipid Profile: No results for input(s): CHOL, HDL, LDLCALC, TRIG, CHOLHDL, LDLDIRECT in the last 72 hours. Thyroid Function Tests: No results for input(s): TSH, T4TOTAL, FREET4, T3FREE, THYROIDAB in the last 72 hours. Anemia Panel: No results for input(s): VITAMINB12, FOLATE, FERRITIN, TIBC, IRON, RETICCTPCT in the last 72 hours. Sepsis Labs: No results for input(s): PROCALCITON, LATICACIDVEN in the last 168 hours.  No results found for this or any previous visit (from the past 240 hour(s)).    Radiology Studies: DG Neck Soft Tissue  Result Date: 01/09/2020 CLINICAL DATA:  Pain and swelling at incision site history of anterior cervical fusion pain with swallowing EXAM: NECK SOFT TISSUES - 1+ VIEW COMPARISON:  12/23/2019, 01/08/2020 FINDINGS: Anterior plate and fixating screws C3 through C5 with interbody device at C3-C4 and C4-C5. Slight enlargement of prevertebral soft tissues at the level of surgical hardware presumably due to postoperative edema. Gas and  fluid levels anterior to the thyroid cartilage. Edema within the subcutaneous soft tissues. IMPRESSION: 1. Stranding and fluid levels within the superficial anterior neck soft tissues at the level of the thyroid, this would be better evaluated with CT 2. Postsurgical changes C3 through C5. Slight enlargement of prevertebral soft tissues at this level, likely postoperative edema. Electronically Signed   By: Donavan Foil M.D.   On: 01/09/2020 20:10   CT Soft Tissue Neck W Contrast  Result Date: 01/09/2020 CLINICAL DATA:  Neck pain and swelling after surgery. EXAM: CT NECK WITH CONTRAST TECHNIQUE: Multidetector CT imaging of the neck was performed using the standard protocol following the bolus administration of intravenous contrast. CONTRAST:  12mL OMNIPAQUE IOHEXOL 300 MG/ML  SOLN COMPARISON:  None. FINDINGS: Pharynx and larynx: There is a retropharyngeal effusion that extends from C2 to C5. The effusion measures 8 mm in thickness. There is mild mass effect on the supraglottic larynx. Normal pharynx. Salivary glands: No inflammation, mass, or stone. Thyroid: Normal. Lymph nodes: None enlarged or abnormal density. Vascular: Bilateral carotid bifurcation  calcific atherosclerosis. Limited intracranial: Negative. Visualized orbits: Negative. Mastoids and visualized paranasal sinuses: Clear. Skeleton: C3-5 ACDF. Upper chest: Negative. Other: Areas of gas tracking along the soft tissues of the left anterior neck, postsurgical. IMPRESSION: 1. An 8 mm postoperative retropharyngeal effusion extending from C2 to C5 with mild mass effect on the supraglottic larynx. 2. C3-5 ACDF with expected postoperative appearance of the soft tissues of the left anterior neck. Electronically Signed   By: Ulyses Jarred M.D.   On: 01/09/2020 22:30    Scheduled Meds: . acetaminophen  500 mg Oral BID  . allopurinol  300 mg Oral Daily  . aspirin EC  81 mg Oral Daily  . atorvastatin  40 mg Oral Daily  . cholecalciferol  1,000 Units  Oral QHS  . dexamethasone (DECADRON) injection  4 mg Intravenous Q6H   Or  . dexamethasone  4 mg Oral Q6H  . docusate sodium  100 mg Oral BID  . fenofibrate  160 mg Oral QHS  . fluticasone  2 spray Each Nare Daily  . furosemide  80 mg Oral Daily  . HYDROcodone-acetaminophen  0.5 tablet Oral BID  . insulin NPH Human  45 Units Subcutaneous BID AC  . insulin regular  0-15 Units Subcutaneous TID WC  . insulin regular  0-5 Units Subcutaneous QHS  . insulin regular  10 Units Subcutaneous Once  . isosorbide mononitrate  30 mg Oral Daily  . magnesium oxide  200 mg Oral QHS  . metoprolol tartrate  50 mg Oral BID  . pantoprazole (PROTONIX) IV  40 mg Intravenous QHS  . sodium chloride flush  3 mL Intravenous Q12H  . tiZANidine  4 mg Oral BID   Continuous Infusions: . sodium chloride    . methocarbamol (ROBAXIN) IV       LOS: 0 days   Time spent: 30 minutes   Darliss Cheney, MD [facility]  01/10/2020, 11:06 AM   To contact the attending provider between 7A-7P or the covering provider during after hours 7P-7A, please log into the web site [email or web address].

## 2020-01-10 NOTE — ED Notes (Signed)
Lunch Tray Ordered @ 1054. 

## 2020-01-10 NOTE — Progress Notes (Addendum)
Inpatient Diabetes Program Recommendations  AACE/ADA: New Consensus Statement on Inpatient Glycemic Control (2015)  Target Ranges:  Prepandial:   less than 140 mg/dL      Peak postprandial:   less than 180 mg/dL (1-2 hours)      Critically ill patients:  140 - 180 mg/dL   Results for NARESH, TRIVINO" (MRN [id number]) as of 01/10/2020 08:47  Ref. Range 01/10/2020 00:36 01/10/2020 02:04 01/10/2020 05:15 01/10/2020 08:12  Glucose-Capillary Latest Ref Range: 70 - 99 mg/dL   10 mg Decadron given at 9:41pm 321 (H) 319 (H)  20 units NPH Insulin  4 mg Decadron given at 2:19am 394 (H)  15 units NOVOLOG  438 (H)   Results for KARMELLO, SILCOX" (MRN [id number]) as of 01/10/2020 08:47  Ref. Range 08/14/2019 08:12 11/21/2019 08:24  Hemoglobin A1C Latest Ref Range: 4.6 - 6.5 % 7.2 (H) 8.0 (H)    Admit with: Neck swelling with dysphagia after anterior cervical decompression and fusion on 3/29  History: DM, CKD, CHF  Home DM Meds: NPH Insulin 45 units BID       Regular Insulin 25-30 units TID with meals  Current Orders: NPH Insulin 45 units BID   Endocrinologist: Dr. [name] with [place]--last seen 12/28/2019--Was instructed to increase his NPH and Regular Insulins (see above for current doses)     Paged by Dr. Ellene Route this AM regarding assistance with CBGs due to severe elevations.  CBG 438 this AM.  Likely due to not enough insulin on board and IV Decadron that has been given.  Note that NPH Insulin increased to 45 units BID this AM--Agree with increase to home dose.  Dr. Ellene Route told me that pt has stated allergy to Novolog insulin and pt's chart states pt has an allergy to Novolog 70/30 Mix insulin as well as Levemir.  Discussed with Dr. Ellene Route that we can collaborate with Pharmacy and start pt on Regular insulin for a correction scale.  Dr. Ellene Route gave me orders to give Regular insulin 10 units X 1 dose this AM for the CBG of 438 and then start the 0-15 correction scale  with the Regular insulin as well TID AC + HS.  Called Pharmacy Apolonio Schneiders) for assistance to place these orders as Regular is not usually given as SSI in the hospital.  Pharmacy to place orders this AM.  Alerted RN Anderson Malta to new Insulin orders as well.     Patient will likely need Meal Coverage as well--Note plans to start CL diet this AM.   --Will follow patient during hospitalization--  Wyn Quaker RN, MSN, CDE Diabetes Coordinator Inpatient Glycemic Control Team Team Pager: [phone] (8a-5p)

## 2020-01-10 NOTE — Progress Notes (Signed)
Patient ID: William Hendrix, male   DOB: [birth year], 79 y.o.   MRN: [id number] Blood sugar is 438 this am. Called Diabetic coordinator to discuss, she will place orders. Patient feels hungry will start liquids,DC D5 IV fluids. Admit for obs and to get blood sugars under control CT shows edema but no discreet clott.

## 2020-01-10 NOTE — Evaluation (Signed)
Occupational Therapy Evaluation Patient Details Name: William Hendrix MRN: [id number] DOB: [birth year] Today's Date: 01/10/2020    History of Present Illness 79 yo male admitted to ED due to neck swelling and dysphagia. Pt s/p ACDF C3-4 C4-5 Dr. Ellene Route 01/08/20. PMH: AAA CHF COPD CKD DM HTN HLD Neuropathy Back surgery 2019 R knee surg 1990s   Clinical Impression   PT admitted with edema at neck with dysphagia. Pt currently with functional limitiations due to the deficits listed below (see OT problem list). Pt expressed dislike for therapy at end of session and "I know my neurosurgeon did not order you." Pt educated on cervical precautions, provided handouts, educated on proper bathing to decr risk of infection and fall risk with single leg standing. Pt was initially receptive to education and then with cognitive challenges became very irritated and raised voice tone. Pt reports "I am going to do what I want to do" and "I have taken care of myself for 79 years I don't need that or you." Due to cognitive deficits detected during session OT keeping patient on service despite pt expressing dislike for services and continue to evaluate for best POC to help progress home d/c.  Pt will benefit from skilled OT to increase their independence and safety with adls and balance to allow discharge home with family PRN.     Follow Up Recommendations  No OT follow up    Equipment Recommendations  None recommended by OT    Recommendations for Other Services       Precautions / Restrictions Precautions Precautions: Cervical Precaution Comments: provided hand out with cervical precautions and fine motor. Code for medbridge is [id number]      Mobility Bed Mobility               General bed mobility comments: sitting EOB on arrival. pt declines any attempts at bed mobility. pt states "i can lay flat if i want to " pt denies any troubl with task but then when asked directly about laying flat after surgery  states "well of course i cant i had this clot in my neck"  Transfers Overall transfer level: Needs assistance   Transfers: Sit to/from Stand Sit to Stand: Min guard         General transfer comment: pt requires (A) at times and describes inability to walk to mailbox and back at baseline    Balance Overall balance assessment: Needs assistance             Standing balance comment: L LE standing single leg mod (A)                           ADL either performed or assessed with clinical judgement   ADL Overall ADL's : Needs assistance/impaired Eating/Feeding: Modified independent;Sitting Eating/Feeding Details (indicate cue type and reason): reports trouble swallowing but demonstrates drinking fluids during session                 Lower Body Dressing: Moderate assistance Lower Body Dressing Details (indicate cue type and reason): pt static standing single leg on wheeled stool in room to simulate how he places foot on chair at home. pt advised to sit to avoid falling due to balance deficits and becoming very agitated and admant that he is not using any tools or doing it any other way. that he knows how to take care of himself and has for 80 years. pt advised to fall risk  and pt states i know that i m not stupid and i am not going to fall. pt at the same time asking for steady assistance from PT during task due to unsteady on single leg standing Toilet Transfer: Min guard           Functional mobility during ADLs: Min guard General ADL Comments: Pt very vocal of his dislike for AE DME and therapist. pt states "my neurosurgeon did not send you. I just saw him this morning. he would not do that. I am going to do what i want to do. You are just a Ecologist me junk"     Vision Baseline Vision/History: Wears glasses Wears Glasses: Reading only       Perception     Praxis      Pertinent Vitals/Pain Pain Assessment: 0-10 Pain Score: 4  Pain  Location: neck Pain Descriptors / Indicators: Discomfort;Operative site guarding Pain Intervention(s): Monitored during session;Premedicated before session;Repositioned     Hand Dominance Right   Extremity/Trunk Assessment Upper Extremity Assessment Upper Extremity Assessment: RUE deficits/detail;LUE deficits/detail LUE Deficits / Details: numbness in 4th and 5th digits with decreased adduction / abduction of all digits. unable to pad to pad thumb to 4th and 5th digit LUE Sensation: decreased light touch LUE Coordination: decreased fine motor;decreased gross motor   Lower Extremity Assessment Lower Extremity Assessment: Defer to PT evaluation   Cervical / Trunk Assessment Cervical / Trunk Assessment: Other exceptions Cervical / Trunk Exceptions: previous surgery   Communication Communication Communication: No difficulties   Cognition Arousal/Alertness: Awake/alert Behavior During Therapy: WFL for tasks assessed/performed Overall Cognitive Status: Within Functional Limits for tasks assessed                                     General Comments  pt advised not to touch wound due to risk for infection and educated on bathing with clean linen each time bathing. pt states okay and then continued to touch incision x3 during session after education. pt reaching for incision location everytime asked or discussing the site.     Exercises     Shoulder Instructions      Home Living Family/patient expects to be discharged to:: Private residence Living Arrangements: Spouse/significant other Available Help at Discharge: Family Type of Home: House Home Access: Stairs to enter Technical brewer of Steps: 1 Entrance Stairs-Rails: None Home Layout: One level     Bathroom Shower/Tub: Occupational psychologist: Handicapped height     Home Equipment: Environmental consultant - 2 wheels;Cane - single point;Shower seat - built in;Grab bars - toilet;Hand held shower head;Grab  bars - tub/shower   Additional Comments: reports wife is "not in good shape"      Prior Functioning/Environment Level of Independence: Independent        Comments: reports indep but when asked more detailed questions expressing limitation to most adls due to neuropathy or hip pain or hand functional deficits . but when directly ask states "i can do it"        OT Problem List: Decreased strength;Decreased activity tolerance;Impaired balance (sitting and/or standing);Decreased cognition;Decreased safety awareness;Decreased knowledge of precautions;Obesity;Pain;Impaired UE functional use      OT Treatment/Interventions: Self-care/ADL training;Therapeutic exercise;Neuromuscular education;Energy conservation;DME and/or AE instruction;Manual therapy;Cognitive remediation/compensation;Therapeutic activities;Patient/family education;Balance training    OT Goals(Current goals can be found in the care plan section) Acute Rehab OT Goals Patient Stated Goal: to not  buy any of that junk - pt referring to AE / DME and having therapist OT Goal Formulation: With patient Time For Goal Achievement: 01/24/20 Potential to Achieve Goals: Good  OT Frequency: Min 2X/week   Barriers to D/C: Decreased caregiver support  reports wife with limited ability to (A)       Co-evaluation PT/OT/SLP Co-Evaluation/Treatment: Yes Reason for Co-Treatment: For patient/therapist safety;To address functional/ADL transfers   OT goals addressed during session: ADL's and self-care;Strengthening/ROM      AM-PAC OT "6 Clicks" Daily Activity     Outcome Measure Help from another person eating meals?: None Help from another person taking care of personal grooming?: A Little Help from another person toileting, which includes using toliet, bedpan, or urinal?: A Little Help from another person bathing (including washing, rinsing, drying)?: A Little Help from another person to put on and taking off regular upper body  clothing?: A Little Help from another person to put on and taking off regular lower body clothing?: A Lot 6 Click Score: 18   End of Session Equipment Utilized During Treatment: Gait belt Nurse Communication: Mobility status;Precautions  Activity Tolerance: Patient tolerated treatment well Patient left: in bed;with call bell/phone within reach  OT Visit Diagnosis: Unsteadiness on feet (R26.81);Muscle weakness (generalized) (M62.81)                Time: [id number] OT Time Calculation (min): 34 min Charges:  OT General Charges $OT Visit: 1 Visit OT Evaluation $OT Eval Moderate Complexity: 1 Mod   [place], OTR/L  Acute Rehabilitation Services Pager: [phone] Office: [phone] .   Jeri Modena 01/10/2020, 12:32 PM

## 2020-01-10 NOTE — Progress Notes (Signed)
Pt arrived on floor, transferred from ED. Patient is A&OX4 and in no acute distress. Pt states pain is 5/10, with no complaints of nausea. Anterior neck incision, with the swelling and tenderness present intact with skin glue.   Pt blood sugar is 400. MD notified.   Patient orienated to room and unit. Bedside table and personal belongings within reach of patient. Patient educated on usage of nurse call bell, placed within reach of patient. Patient instructed to call for assistance. Patient in bed. Will continue to monitor.

## 2020-01-11 LAB — GLUCOSE, CAPILLARY
Glucose-Capillary: 182 mg/dL — ABNORMAL HIGH (ref 70–99)
Glucose-Capillary: 196 mg/dL — ABNORMAL HIGH (ref 70–99)
Glucose-Capillary: 212 mg/dL — ABNORMAL HIGH (ref 70–99)
Glucose-Capillary: 291 mg/dL — ABNORMAL HIGH (ref 70–99)
Glucose-Capillary: 309 mg/dL — ABNORMAL HIGH (ref 70–99)
Glucose-Capillary: 414 mg/dL — ABNORMAL HIGH (ref 70–99)

## 2020-01-11 MED ORDER — INSULIN NPH (HUMAN) (ISOPHANE) 100 UNIT/ML ~~LOC~~ SUSP
50.0000 [IU] | Freq: Two times a day (BID) | SUBCUTANEOUS | Status: DC
  Administered 2020-01-11 – 2020-01-12 (×2): 50 [IU] via SUBCUTANEOUS

## 2020-01-11 MED ORDER — DEXAMETHASONE 4 MG PO TABS
2.0000 mg | ORAL_TABLET | Freq: Two times a day (BID) | ORAL | Status: DC
  Administered 2020-01-11 – 2020-01-12 (×2): 2 mg via ORAL
  Filled 2020-01-11 (×2): qty 1

## 2020-01-11 NOTE — Progress Notes (Signed)
PT Cancellation Note  Patient Details Name: William Hendrix MRN: [id number] DOB: Feb 28, 1941   Cancelled Treatment:    Reason Eval/Treat Not Completed: Patient declined, no reason specified. Per discussion with RN pt is reporting he does not need therapy and does not want therapy at this time. RN reports pt is mobilizing to bathroom with limited assistance. PT will continue to follow as patient has demonstrated limited mobility out of room to this point. If pt continues to refuse then PT will sign off.   Zenaida Niece 01/11/2020, 1:20 PM

## 2020-01-11 NOTE — Progress Notes (Signed)
Patient CBG at 22:00=414. MD notified

## 2020-01-11 NOTE — Progress Notes (Signed)
Occupational Therapy Treatment Patient Details Name: William Hendrix MRN: [id number] DOB: 12/15/1940 Today's Date: 01/11/2020    History of present illness 79 yo male admitted to ED due to neck swelling and dysphagia. Pt s/p ACDF C3-4 C4-5 Dr. Ellene Route 01/08/20. PMH: AAA CHF COPD CKD DM HTN HLD Neuropathy Back surgery 2019 R knee surg 1990s   OT comments  Upon arrival, pt supine in bed with wife at bedside. Pt verbalizing confidence in his knowledge of cervical precautions and stating his family will assist him at dc with ADLs. Reviewing education on compensatory techniques for  UB dressing, LB dressing, and bed mobility. Encouraging pt to perform OOB activity. Pt verbalizing he walks to/from bathroom. Recommend dc to home once medically stable per physician. Feel pt would benefit from further acute OT. However, pt verbalizing he does not need or want therapy. Will sign off.    Follow Up Recommendations  No OT follow up    Equipment Recommendations  None recommended by OT    Recommendations for Other Services      Precautions / Restrictions Precautions Precautions: Cervical Precaution Booklet Issued: Yes (comment) Precaution Comments: Reviewed cervical precautions. Pt reporting Dr. Ellene Route verbalize he may more his head side to side and down but not up.(Code for medbridge is [id number]) Required Braces or Orthoses: Other Brace Other Brace: No brace per md order Restrictions Weight Bearing Restrictions: No       Mobility Bed Mobility               General bed mobility comments: Review log roll. Pt declines OOB activity  Transfers                 General transfer comment: Declines OOB activity    Balance                                           ADL either performed or assessed with clinical judgement   ADL Overall ADL's : Needs assistance/impaired                   Upper Body Dressing Details (indicate cue type and reason): Educating pt  on UB dressing techniques to adhere to cervical precautions. Pt stating his wife will assist him with dressing at dc.    Lower Body Dressing Details (indicate cue type and reason): Reviewing LB dressing techniques and pt stating his daughter will assist.               General ADL Comments: Reviewing cervical precautions and compensatory techniques for ADLs. Pt continues to verablize that he will have his family help and knows how to take care of himself.      Vision       Perception     Praxis      Cognition Arousal/Alertness: Awake/alert Behavior During Therapy: WFL for tasks assessed/performed;Agitated Overall Cognitive Status: Difficult to assess                                 General Comments: Feel pt is at baseline congition. Pt becoming quickly frustrated and stating "I don't wnat anything you're selling. This is my sixth surgery. I don't need no therapy." Pt more agreeable with calm cues and able to discuss different ADL techniques but pt declining to participate. Slightly tangiental as pt explained  his history with surgeries.         Exercises     Shoulder Instructions       General Comments Wife present throughout    Pertinent Vitals/ Pain       Pain Assessment: Faces Faces Pain Scale: No hurt Pain Location: neck Pain Descriptors / Indicators: Discomfort;Operative site guarding Pain Intervention(s): Monitored during session  Home Living                                          Prior Functioning/Environment              Frequency  Min 2X/week        Progress Toward Goals  OT Goals(current goals can now be found in the care plan section)  Progress towards OT goals: Goals met/education completed, patient discharged from OT  Acute Rehab OT Goals Patient Stated Goal: to not buy any of that junk - pt referring to AE / DME and having therapist OT Goal Formulation: With patient Time For Goal Achievement:  01/24/20 Potential to Achieve Goals: Good ADL Goals Pt Will Transfer to Toilet: with modified independence;ambulating;regular height toilet Additional ADL Goal #1: pt will complete balance assessment with score that indicates safe d/c home mod I as precursor to adls Additional ADL Goal #2: pt will complete executive cognitive asessment such as pill box without errors (pt describes alot of medication at baseline)  Plan All goals met and education completed, patient discharged from OT services    Co-evaluation                 AM-PAC OT "6 Clicks" Daily Activity     Outcome Measure   Help from another person eating meals?: None Help from another person taking care of personal grooming?: A Little Help from another person toileting, which includes using toliet, bedpan, or urinal?: A Little Help from another person bathing (including washing, rinsing, drying)?: A Little Help from another person to put on and taking off regular upper body clothing?: A Little Help from another person to put on and taking off regular lower body clothing?: A Lot 6 Click Score: 18    End of Session    OT Visit Diagnosis: Unsteadiness on feet (R26.81);Muscle weakness (generalized) (M62.81)   Activity Tolerance Patient tolerated treatment well   Patient Left in bed;with call bell/phone within reach;with family/visitor present   Nurse Communication Mobility status;Precautions        Time: 1504-1364 OT Time Calculation (min): 17 min  Charges: OT General Charges $OT Visit: 1 Visit OT Treatments $Self Care/Home Management : 8-22 mins  [place], OTR/L Acute Rehab Pager: [phone] Office: [place] 01/11/2020, 3:45 PM

## 2020-01-11 NOTE — Progress Notes (Addendum)
Patient ID: William Hendrix, male   DOB: 1941/06/02, 79 y.o.   MRN: [id number] Swallowing function is improving Voice is a little higher in [place] Incision is clean and dry Appears to be responding to steroids We will plan discharge home tomorrow We will change Decadron to 2 mg p.o. twice a day

## 2020-01-11 NOTE — Progress Notes (Signed)
Dr. Ellene Route approved soft diet on cone messenger

## 2020-01-11 NOTE — Progress Notes (Signed)
PROGRESS NOTE    William Hendrix  [id number] DOB: 1940/10/29 DOA: 01/09/2020 PCP: [name], MD   Brief Narrative:  79 yo M with PMH of insulin-dependent T2DM, CAD, CKD who presented to ER for post-op complication after cervical decompression and fusion surgery yesterday. Neurosurgery admitting. Consulted for DM management per EDP.   Assessment & Plan:   Active Problems:   Controlled diabetes mellitus type 2 with complications (HCC)   Post-operative complication   Dysphagia  8 mm postoperative retropharyngeal effusion/blood clot extending from 99991111: Postop complication.  Managed by primary service/neurosurgery.  Type 2 diabetes mellitus: Blood sugar still elevated.  Seen by diabetes coordinator.  Per their recommendations, will increase to 50 units NPH twice daily however continue current sliding scale insulin assuming he will continue on dexamethasone.  Hyperlipidemia: Continue home dose of atorvastatin.  Essential hypertension: Controlled.  Continue home medications.   DVT prophylaxis: Per primary service   Code Status: Full Code  Family Communication:  None present at bedside.  Plan of care discussed with patient in length and he verbalized understanding and agreed with it. Patient is from: Home Disposition Plan: Per primary service.  Per patient, primary service plans to discharge him if he tolerates the diet. Barriers to discharge: Per primary service.   Estimated body mass index is 37.96 kg/m as calculated from the following:   Height as of this encounter: 5\' 10"  (1.778 m).   Weight as of this encounter: 120 kg.  Pressure Injury 09/15/18 Deep Tissue Injury - Purple or maroon localized area of discolored intact skin or blood-filled blister due to damage of underlying soft tissue from pressure and/or shear. Assessed with Cyril Mourning, RN (Active)  09/15/18 1145  Location: Buttocks  Location Orientation: Left  Staging: Deep Tissue Injury - Purple or maroon  localized area of discolored intact skin or blood-filled blister due to damage of underlying soft tissue from pressure and/or shear.  Wound Description (Comments): Assessed with Cyril Mourning, RN  Present on Admission: Yes     Nutritional status:               Consultants:   Hospitalist  Procedures:   None  Antimicrobials:   None   Subjective: Seen and examined.  Feels much better but is still with left anterior neck pain which is improved.  Tolerating clear liquid diet.  Objective: Vitals:   01/11/20 0006 01/11/20 0406 01/11/20 0726 01/11/20 1104  BP: (!) 118/59 (!) 118/57 (!) 126/57 103/63  Pulse: (!) 46 (!) 50 (!) 49 (!) 52  Resp:   15 15  Temp: 98.3 F (36.8 C) 97.6 F (36.4 C) 98.6 F (37 C) (!) 97.4 F (36.3 C)  TempSrc: Oral Oral    SpO2: 99% 98% 96% 98%  Weight:      Height:        Intake/Output Summary (Last 24 hours) at 01/11/2020 1255 Last data filed at 01/11/2020 1033 Gross per 24 hour  Intake 3 ml  Output 1 ml  Net 2 ml   Filed Weights   01/09/20 1916  Weight: 120 kg    Examination:  General exam: Appears calm and comfortable  Respiratory system: Clear to auscultation. Respiratory effort normal. Cardiovascular system: S1 & S2 heard, RRR. No JVD, murmurs, rubs, gallops or clicks. No pedal edema. Gastrointestinal system: Abdomen is nondistended, soft and nontender. No organomegaly or masses felt. Normal bowel sounds heard. Central nervous system: Alert and oriented. No focal neurological deficits. Extremities: Symmetric 5 x 5 power. Skin: No  rashes, lesions or ulcers.  Psychiatry: Judgement and insight appear normal. Mood & affect appropriate.    Data Reviewed: I have personally reviewed following labs and imaging studies  CBC: Recent Labs  Lab 01/09/20 1953  WBC 8.2  NEUTROABS 5.4  HGB 12.4*  HCT 38.1*  MCV 93.2  PLT 123XX123   Basic Metabolic Panel: Recent Labs  Lab 01/09/20 1953  NA 136  K 4.5  CL 97*  CO2 28  GLUCOSE 444*   BUN 25*  CREATININE 1.58*  CALCIUM 9.5   GFR: Estimated Creatinine Clearance: 49.2 mL/min (A) (by C-G formula based on SCr of 1.58 mg/dL (H)). Liver Function Tests: Recent Labs  Lab 01/09/20 1953  AST 23  ALT 19  ALKPHOS 51  BILITOT 0.5  PROT 6.5  ALBUMIN 3.8   No results for input(s): LIPASE, AMYLASE in the last 168 hours. No results for input(s): AMMONIA in the last 168 hours. Coagulation Profile: No results for input(s): INR, PROTIME in the last 168 hours. Cardiac Enzymes: No results for input(s): CKTOTAL, CKMB, CKMBINDEX, TROPONINI in the last 168 hours. BNP (last 3 results) Recent Labs    12/13/19 1150  PROBNP 131   HbA1C: No results for input(s): HGBA1C in the last 72 hours. CBG: Recent Labs  Lab 01/10/20 2113 01/11/20 0007 01/11/20 0406 01/11/20 0726 01/11/20 1103  GLUCAP 307* 212* 182* 196* 309*   Lipid Profile: No results for input(s): CHOL, HDL, LDLCALC, TRIG, CHOLHDL, LDLDIRECT in the last 72 hours. Thyroid Function Tests: No results for input(s): TSH, T4TOTAL, FREET4, T3FREE, THYROIDAB in the last 72 hours. Anemia Panel: No results for input(s): VITAMINB12, FOLATE, FERRITIN, TIBC, IRON, RETICCTPCT in the last 72 hours. Sepsis Labs: No results for input(s): PROCALCITON, LATICACIDVEN in the last 168 hours.  Recent Results (from the past 240 hour(s))  MRSA PCR Screening     Status: None   Collection Time: 01/10/20  5:02 PM   Specimen: Nasopharyngeal Wash  Result Value Ref Range Status   MRSA by PCR NEGATIVE NEGATIVE Final    Comment:        The GeneXpert MRSA Assay (FDA approved for NASAL specimens only), is one component of a comprehensive MRSA colonization surveillance program. It is not intended to diagnose MRSA infection nor to guide or monitor treatment for MRSA infections. Performed at [facility], [place] [address] [place], [place] [address]       Radiology Studies: DG Neck Soft Tissue  Result Date: 01/09/2020 CLINICAL  DATA:  Pain and swelling at incision site history of anterior cervical fusion pain with swallowing EXAM: NECK SOFT TISSUES - 1+ VIEW COMPARISON:  12/23/2019, 01/08/2020 FINDINGS: Anterior plate and fixating screws C3 through C5 with interbody device at C3-C4 and C4-C5. Slight enlargement of prevertebral soft tissues at the level of surgical hardware presumably due to postoperative edema. Gas and fluid levels anterior to the thyroid cartilage. Edema within the subcutaneous soft tissues. IMPRESSION: 1. Stranding and fluid levels within the superficial anterior neck soft tissues at the level of the thyroid, this would be better evaluated with CT 2. Postsurgical changes C3 through C5. Slight enlargement of prevertebral soft tissues at this level, likely postoperative edema. Electronically Signed   By: Donavan Foil M.D.   On: 01/09/2020 20:10   CT Soft Tissue Neck W Contrast  Result Date: 01/09/2020 CLINICAL DATA:  Neck pain and swelling after surgery. EXAM: CT NECK WITH CONTRAST TECHNIQUE: Multidetector CT imaging of the neck was performed using the standard protocol following  the bolus administration of intravenous contrast. CONTRAST:  21mL OMNIPAQUE IOHEXOL 300 MG/ML  SOLN COMPARISON:  None. FINDINGS: Pharynx and larynx: There is a retropharyngeal effusion that extends from C2 to C5. The effusion measures 8 mm in thickness. There is mild mass effect on the supraglottic larynx. Normal pharynx. Salivary glands: No inflammation, mass, or stone. Thyroid: Normal. Lymph nodes: None enlarged or abnormal density. Vascular: Bilateral carotid bifurcation calcific atherosclerosis. Limited intracranial: Negative. Visualized orbits: Negative. Mastoids and visualized paranasal sinuses: Clear. Skeleton: C3-5 ACDF. Upper chest: Negative. Other: Areas of gas tracking along the soft tissues of the left anterior neck, postsurgical. IMPRESSION: 1. An 8 mm postoperative retropharyngeal effusion extending from C2 to C5 with mild mass  effect on the supraglottic larynx. 2. C3-5 ACDF with expected postoperative appearance of the soft tissues of the left anterior neck. Electronically Signed   By: Ulyses Jarred M.D.   On: 01/09/2020 22:30    Scheduled Meds: . acetaminophen  500 mg Oral BID  . allopurinol  300 mg Oral Daily  . aspirin EC  81 mg Oral Daily  . atorvastatin  40 mg Oral Daily  . cholecalciferol  1,000 Units Oral QHS  . dexamethasone (DECADRON) injection  4 mg Intravenous Q6H   Or  . dexamethasone  4 mg Oral Q6H  . docusate sodium  100 mg Oral BID  . fenofibrate  160 mg Oral QHS  . fluticasone  2 spray Each Nare Daily  . furosemide  80 mg Oral Daily  . HYDROcodone-acetaminophen  0.5 tablet Oral BID  . insulin NPH Human  50 Units Subcutaneous BID AC  . insulin regular  0-15 Units Subcutaneous TID WC  . insulin regular  0-5 Units Subcutaneous QHS  . isosorbide mononitrate  30 mg Oral Daily  . magnesium oxide  200 mg Oral QHS  . metoprolol tartrate  50 mg Oral BID  . pantoprazole (PROTONIX) IV  40 mg Intravenous QHS  . sodium chloride flush  3 mL Intravenous Q12H  . tiZANidine  4 mg Oral BID   Continuous Infusions: . sodium chloride    . methocarbamol (ROBAXIN) IV       LOS: 1 day   Time spent: 25 minutes   Darliss Cheney, MD [facility]  01/11/2020, 12:55 PM   To contact the attending provider between 7A-7P or the covering provider during after hours 7P-7A, please log into the web site [email or web address].

## 2020-01-11 NOTE — Progress Notes (Signed)
Inpatient Diabetes Program Recommendations  AACE/ADA: New Consensus Statement on Inpatient Glycemic Control   Target Ranges:  Prepandial:   less than 140 mg/dL      Peak postprandial:   less than 180 mg/dL (1-2 hours)      Critically ill patients:  140 - 180 mg/dL  Results for William Hendrix, William Hendrix" (MRN [id number]) as of 01/11/2020 08:04  Ref. Range 01/10/2020 08:12 01/10/2020 11:31 01/10/2020 12:11 01/10/2020 16:04 01/10/2020 16:44 01/10/2020 21:13 01/11/2020 00:07 01/11/2020 04:06 01/11/2020 07:26  Glucose-Capillary Latest Ref Range: 70 - 99 mg/dL 438 (H) 383 (H) 386 (H) 392 (H) 400 (H) 307 (H) 212 (H) 182 (H) 196 (H)    Review of Glycemic Control  Admit with: Neck swelling with dysphagia after anterior cervical decompression and fusion on 3/29  History: DM, CKD, CHF  Home DM Meds: NPH Insulin 45 units BID                             Regular Insulin 25-30 units TID with meals  Current Orders: NPH Insulin 45 units BID, Regular 0-15 units TID with meals, Regular 0-5 units QHS; Decadron 4 mg Q6H   Inpatient Recommendations:  Insulin-Basal: If steroids are continued as ordered, please consider increasing NPH to 55 units QAM and 48 units QPM.  Insulin-Meal Coverage: If steroids are continued, please consider ordering Regular 5 units TID with meals for meal coverage if patient eats at least 50% of meals.  NOTE: Patient sees Dr. [name] with [facility]--last seen 12/28/2019--Was instructed to increase his NPH and Regular Insulins (see above for current doses). Patient has an allergy to Levemir and Novolog mix insulin. Therefore, using NPH and Regular insulin while inpatient.  Thanks, Barnie Alderman, RN, MSN, CDE Diabetes Coordinator Inpatient Diabetes Program [phone] (Team Pager from 8am to 5pm)

## 2020-01-12 LAB — GLUCOSE, CAPILLARY
Glucose-Capillary: 240 mg/dL — ABNORMAL HIGH (ref 70–99)
Glucose-Capillary: 281 mg/dL — ABNORMAL HIGH (ref 70–99)
Glucose-Capillary: 349 mg/dL — ABNORMAL HIGH (ref 70–99)
Glucose-Capillary: 372 mg/dL — ABNORMAL HIGH (ref 70–99)

## 2020-01-12 MED ORDER — INSULIN NPH (HUMAN) (ISOPHANE) 100 UNIT/ML ~~LOC~~ SUSP: 50.0000 [IU] | Freq: Two times a day (BID) | SUBCUTANEOUS | 11 refills | Status: DC

## 2020-01-12 MED ORDER — DEXAMETHASONE 1 MG PO TABS: ORAL_TABLET | ORAL | 0 refills | Status: DC

## 2020-01-12 MED ORDER — HYDROCODONE-ACETAMINOPHEN 10-325 MG PO TABS: 0.5000 | ORAL_TABLET | Freq: Two times a day (BID) | ORAL | 0 refills | Status: DC

## 2020-01-12 MED ORDER — PANTOPRAZOLE SODIUM 40 MG PO TBEC: 40.0000 mg | DELAYED_RELEASE_TABLET | Freq: Every day | ORAL | Status: DC

## 2020-01-12 NOTE — Discharge Summary (Signed)
Physician Discharge Summary  Patient ID: William Hendrix MRN: [id number] DOB/AGE: August 08, 1941 79 y.o.  Admit date: 01/09/2020 Discharge date: 01/12/2020  Admission Diagnoses: Dysphagia postoperative cervical decompression and fusion C3-4 C4-5.  Diabetes mellitus.  Discharge Diagnoses: Dysphagia, status post cervical decompression and fusion C3-4 C4-5.  Diabetes mellitus. Active Problems:   Controlled diabetes mellitus type 2 with complications ([place])   Post-operative complication   Dysphagia   Discharged Condition: good  Hospital Course: Patient was admitted to undergo further evaluation having severe difficulty swallowing.  He was noted to have some edema in the soft tissues around the surgical site and he responded to Decadron.  He is a type II diabetic and his insulin had to be adjusted requiring a hospitalist consultation.  Consults: Hospitalist  Significant Diagnostic Studies: None  Treatments: Medication  Discharge Exam: Blood pressure (!) 115/56, pulse (!) 56, temperature 98.4 F (36.9 C), temperature source Oral, resp. rate 18, height 5\' 10"  (1.778 m), weight 120 kg, SpO2 99 %. Moderate amount of swelling in the area of the surgical bed in the cervical spine.  Speech however is intact swallowing capacity is intact.  Station and gait are intact also.  Disposition: Discharge disposition: 01-Home or Self Care       Discharge Instructions    Call MD for:  redness, tenderness, or signs of infection (pain, swelling, redness, odor or green/yellow discharge around incision site)   Complete by: As directed    Call MD for:  severe uncontrolled pain   Complete by: As directed    Call MD for:  temperature >100.4   Complete by: As directed    Diet - low sodium heart healthy   Complete by: As directed    Increase activity slowly   Complete by: As directed      Allergies as of 01/12/2020      Reactions   Brilinta [ticagrelor] Shortness Of Breath   Insulin Detemir Swelling,  Other (See Comments), Itching   Patient had redness and swelling and tenderness at injection site. Patient had redness and swelling and tenderness at injection site.   Bee Venom    Codeine Other (See Comments)   Makes him "shakey", is OK with hydrocodone GI UPSET & TREMORS Makes him "shakey", is OK with hydrocodone Makes him "shakey", is OK with hydrocodone GI UPSET & TREMORS   Lipitor [atorvastatin] Other (See Comments)   Muscle aches; affects liver function- Patient is currently taking   Novolog Mix [insulin Aspart Prot & Aspart] Other (See Comments)   Causes skin to swell/itch at injection site      Medication List    TAKE these medications   acetaminophen 500 MG tablet Commonly known as: TYLENOL Take 500 mg by mouth 2 (two) times daily.   allopurinol 300 MG tablet Commonly known as: ZYLOPRIM TAKE 1 TABLET EVERY DAY   aspirin EC 81 MG tablet Take 81 mg by mouth daily.   atorvastatin 40 MG tablet Commonly known as: LIPITOR TAKE 1 TABLET EVERY DAY   B COMPLEX-B12 PO Take 1 tablet by mouth daily.   CHLOR-TRIMETON ALLERGY PO Take 1 tablet by mouth every 6 (six) hours as needed (allergies).   dexamethasone 1 MG tablet Commonly known as: DECADRON 2 tablets twice daily for 2 days, one tablet twice daily for 2 days, one tablet daily for 2 days.   fenofibrate 160 MG tablet TAKE 1 TABLET (160 MG TOTAL) BY MOUTH AT BEDTIME.   fluocinonide cream 0.05 % Commonly known as: LIDEX Apply  1 application topically daily as needed (for rashes).   fluticasone 50 MCG/ACT nasal spray Commonly known as: FLONASE Place 2 sprays into both nostrils daily. What changed:   when to take this  reasons to take this   furosemide 40 MG tablet Commonly known as: LASIX Take 2 tablets (80 mg total) by mouth daily.   HYDROcodone-acetaminophen 10-325 MG tablet Commonly known as: NORCO Take 0.5 tablets by mouth 2 (two) times daily. What changed:   how much to take  when to take  this  reasons to take this   insulin NPH Human 100 UNIT/ML injection Commonly known as: NovoLIN N Inject 0.5 mLs (50 Units total) into the skin 2 (two) times daily before a meal. What changed:   how much to take  how to take this  when to take this  additional instructions   insulin regular 100 units/mL injection Commonly known as: NovoLIN R ReliOn Inject 25-30 units 3 times a day before meals. What changed:   how much to take  how to take this  when to take this   Insulin Syringe-Needle U-100 31G X 5/16" 0.3 ML Misc Commonly known as: BD Insulin Syringe U/F Use to inject insulin 5 times a day. What changed:   how much to take  how to take this  when to take this  additional instructions   isosorbide mononitrate 30 MG 24 hr tablet Commonly known as: IMDUR TAKE 1 TABLET (30 MG TOTAL) BY MOUTH DAILY.   MAGNESIUM PO Take 1 tablet by mouth at bedtime.   Melatonin 10 MG Tabs Take 10 mg by mouth at bedtime.   metoprolol tartrate 50 MG tablet Commonly known as: LOPRESSOR TAKE 1 TABLET TWICE DAILY What changed: how much to take   nitroGLYCERIN 0.4 MG SL tablet Commonly known as: Nitrostat Place 1 tablet (0.4 mg total) under the tongue every 5 (five) minutes as needed for chest pain.   pantoprazole 20 MG tablet Commonly known as: PROTONIX TAKE 1 TABLET (20 MG TOTAL) BY MOUTH DAILY.   PROBIOTIC PO Take 1 capsule by mouth at bedtime.   tiZANidine 4 MG tablet Commonly known as: ZANAFLEX Take 4 mg by mouth 2 (two) times daily.   True Metrix Blood Glucose Test test strip Generic drug: glucose blood TEST BLOOD SUGAR THREE TIMES DAILY AS DIRECTED What changed: See the new instructions.   TRUEplus Lancets 33G Misc Use to check blood sugar 3 times a day What changed:   how much to take  how to take this  when to take this   Vitamin D3 25 MCG (1000 UT) Caps Take 1,000 Units by mouth at bedtime.        Signed: Blanchie Dessert Brynlynn Walko 01/12/2020, 2:13  PM

## 2020-01-12 NOTE — Plan of Care (Signed)
Patient discharge education completed. Questions answered.

## 2020-01-12 NOTE — Progress Notes (Signed)
PROGRESS NOTE    William Hendrix  [id number] DOB: 1941-05-08 DOA: 01/09/2020 PCP: [name], MD   Brief Narrative:  79 yo M with PMH of insulin-dependent T2DM, CAD, CKD who presented to ER for post-op complication after cervical decompression and fusion surgery yesterday. Neurosurgery admitting. Consulted for DM management per EDP.   Assessment & Plan:   Active Problems:   Controlled diabetes mellitus type 2 with complications (HCC)   Post-operative complication   Dysphagia  8 mm postoperative retropharyngeal effusion/blood clot extending from 99991111: Postop complication.  Managed by primary service/neurosurgery.  Type 2 diabetes mellitus: Blood sugar still elevated despite of increasing NPH to 50 units yesterday.  Per chart review, it sounds like she is going to be discharged today and she will be discharged on 2 mg of dexamethasone p.o. twice daily.  If she does then I recommend increasing NPH to 50 units twice daily at discharge as long as he is on dexamethasone and monitoring blood sugar as he was doing 4 times a day at home and then reducing back to NPH 45 units twice daily once he is off of the dexamethasone.  Hyperlipidemia: Continue home dose of atorvastatin.  Essential hypertension: Controlled.  Continue home medications.   DVT prophylaxis: Per primary service   Code Status: Full Code  Family Communication:  None present at bedside.  Plan of care discussed with patient in length and he verbalized understanding and agreed with it. Patient is from: Home Disposition Plan: Per primary service.  Per patient, primary service plans to discharge him if he tolerates the diet. Barriers to discharge: Per primary service.   Estimated body mass index is 37.96 kg/m as calculated from the following:   Height as of this encounter: 5\' 10"  (1.778 m).   Weight as of this encounter: 120 kg.  Pressure Injury 09/15/18 Deep Tissue Injury - Purple or maroon localized area of  discolored intact skin or blood-filled blister due to damage of underlying soft tissue from pressure and/or shear. Assessed with Cyril Mourning, RN (Active)  09/15/18 1145  Location: Buttocks  Location Orientation: Left  Staging: Deep Tissue Injury - Purple or maroon localized area of discolored intact skin or blood-filled blister due to damage of underlying soft tissue from pressure and/or shear.  Wound Description (Comments): Assessed with Cyril Mourning, RN  Present on Admission: Yes     Nutritional status:               Consultants:   Hospitalist  Procedures:   None  Antimicrobials:   None   Subjective: Seen and examined.  Feels much better pain neck pain is improved.  Tolerating soft diet.  Hoping to go home today.  Objective: Vitals:   01/11/20 2218 01/12/20 0400 01/12/20 0729 01/12/20 1125  BP: (!) 144/63 120/67 138/72 (!) 115/56  Pulse: 82 82 (!) 57 (!) 56  Resp:   18 18  Temp:  97.6 F (36.4 C) 97.7 F (36.5 C) 98.4 F (36.9 C)  TempSrc:  Oral Oral Oral  SpO2:  98% 100% 99%  Weight:      Height:        Intake/Output Summary (Last 24 hours) at 01/12/2020 1144 Last data filed at 01/12/2020 0745 Gross per 24 hour  Intake 643 ml  Output -  Net 643 ml   Filed Weights   01/09/20 1916  Weight: 120 kg    Examination:  General exam: Appears calm and comfortable  Respiratory system: Clear to auscultation. Respiratory effort normal. Cardiovascular  system: S1 & S2 heard, RRR. No JVD, murmurs, rubs, gallops or clicks. No pedal edema. Gastrointestinal system: Abdomen is nondistended, soft and nontender. No organomegaly or masses felt. Normal bowel sounds heard. Central nervous system: Alert and oriented. No focal neurological deficits. Extremities: Symmetric 5 x 5 power. Skin: No rashes, lesions or ulcers.  Psychiatry: Judgement and insight appear normal. Mood & affect appropriate.    Data Reviewed: I have personally reviewed following labs and imaging studies   CBC: Recent Labs  Lab 01/09/20 1953  WBC 8.2  NEUTROABS 5.4  HGB 12.4*  HCT 38.1*  MCV 93.2  PLT 123XX123   Basic Metabolic Panel: Recent Labs  Lab 01/09/20 1953  NA 136  K 4.5  CL 97*  CO2 28  GLUCOSE 444*  BUN 25*  CREATININE 1.58*  CALCIUM 9.5   GFR: Estimated Creatinine Clearance: 49.2 mL/min (A) (by C-G formula based on SCr of 1.58 mg/dL (H)). Liver Function Tests: Recent Labs  Lab 01/09/20 1953  AST 23  ALT 19  ALKPHOS 51  BILITOT 0.5  PROT 6.5  ALBUMIN 3.8   No results for input(s): LIPASE, AMYLASE in the last 168 hours. No results for input(s): AMMONIA in the last 168 hours. Coagulation Profile: No results for input(s): INR, PROTIME in the last 168 hours. Cardiac Enzymes: No results for input(s): CKTOTAL, CKMB, CKMBINDEX, TROPONINI in the last 168 hours. BNP (last 3 results) Recent Labs    12/13/19 1150  PROBNP 131   HbA1C: No results for input(s): HGBA1C in the last 72 hours. CBG: Recent Labs  Lab 01/11/20 2155 01/12/20 0034 01/12/20 0428 01/12/20 0725 01/12/20 1120  GLUCAP 414* 349* 281* 240* 372*   Lipid Profile: No results for input(s): CHOL, HDL, LDLCALC, TRIG, CHOLHDL, LDLDIRECT in the last 72 hours. Thyroid Function Tests: No results for input(s): TSH, T4TOTAL, FREET4, T3FREE, THYROIDAB in the last 72 hours. Anemia Panel: No results for input(s): VITAMINB12, FOLATE, FERRITIN, TIBC, IRON, RETICCTPCT in the last 72 hours. Sepsis Labs: No results for input(s): PROCALCITON, LATICACIDVEN in the last 168 hours.  Recent Results (from the past 240 hour(s))  MRSA PCR Screening     Status: None   Collection Time: 01/10/20  5:02 PM   Specimen: Nasopharyngeal Wash  Result Value Ref Range Status   MRSA by PCR NEGATIVE NEGATIVE Final    Comment:        The GeneXpert MRSA Assay (FDA approved for NASAL specimens only), is one component of a comprehensive MRSA colonization surveillance program. It is not intended to diagnose MRSA infection  nor to guide or monitor treatment for MRSA infections. Performed at [facility], [place] [address] [place], [place] [address]       Radiology Studies: No results found.  Scheduled Meds: . acetaminophen  500 mg Oral BID  . allopurinol  300 mg Oral Daily  . aspirin EC  81 mg Oral Daily  . atorvastatin  40 mg Oral Daily  . cholecalciferol  1,000 Units Oral QHS  . dexamethasone  2 mg Oral Q12H  . docusate sodium  100 mg Oral BID  . fenofibrate  160 mg Oral QHS  . fluticasone  2 spray Each Nare Daily  . furosemide  80 mg Oral Daily  . HYDROcodone-acetaminophen  0.5 tablet Oral BID  . insulin NPH Human  50 Units Subcutaneous BID AC  . insulin regular  0-15 Units Subcutaneous TID WC  . insulin regular  0-5 Units Subcutaneous QHS  . isosorbide mononitrate  30 mg Oral Daily  . magnesium oxide  200 mg Oral QHS  . metoprolol tartrate  50 mg Oral BID  . pantoprazole  40 mg Oral QHS  . sodium chloride flush  3 mL Intravenous Q12H  . tiZANidine  4 mg Oral BID   Continuous Infusions: . sodium chloride    . methocarbamol (ROBAXIN) IV       LOS: 2 days   Time spent: 24 minutes   Darliss Cheney, MD [facility]  01/12/2020, 11:44 AM   To contact the attending provider between 7A-7P or the covering provider during after hours 7P-7A, please log into the web site [email or web address].

## 2020-01-12 NOTE — Progress Notes (Signed)
Results for DELANCE, VANDERZWAAG (MRN [id number]) as of 01/12/2020 13:10  Ref. Range 01/11/2020 21:55 01/12/2020 00:34 01/12/2020 04:28 01/12/2020 07:25 01/12/2020 11:20  Glucose-Capillary Latest Ref Range: 70 - 99 mg/dL 414 (H) 349 (H) 281 (H) 240 (H) 372 (H)  Noted that blood sugars continue to be greater than 200 mg/dl.  Recommend increasing NPH insulin dose to 55 units BID and add Regular insulin 4 units TID with meals if patient eats at least 50% of meal.  Harvel Ricks RN BSN CDE Diabetes Coordinator Pager: [phone]  8am-5pm

## 2020-01-12 NOTE — Progress Notes (Signed)
Provided patient and his wife with discharge instructions and answered questions.Patient taken out to car in wheelchair.

## 2020-01-17 ENCOUNTER — Other Ambulatory Visit: Payer: Self-pay | Admitting: Family Medicine

## 2020-01-21 IMAGING — DX DG CHEST 2V
2 series · 2 of 2 positions shown · non-contrast
Comparison: 07/25/2018

CLINICAL DATA: Chest pain

EXAM:
CHEST - 2 VIEW

[chest pa]
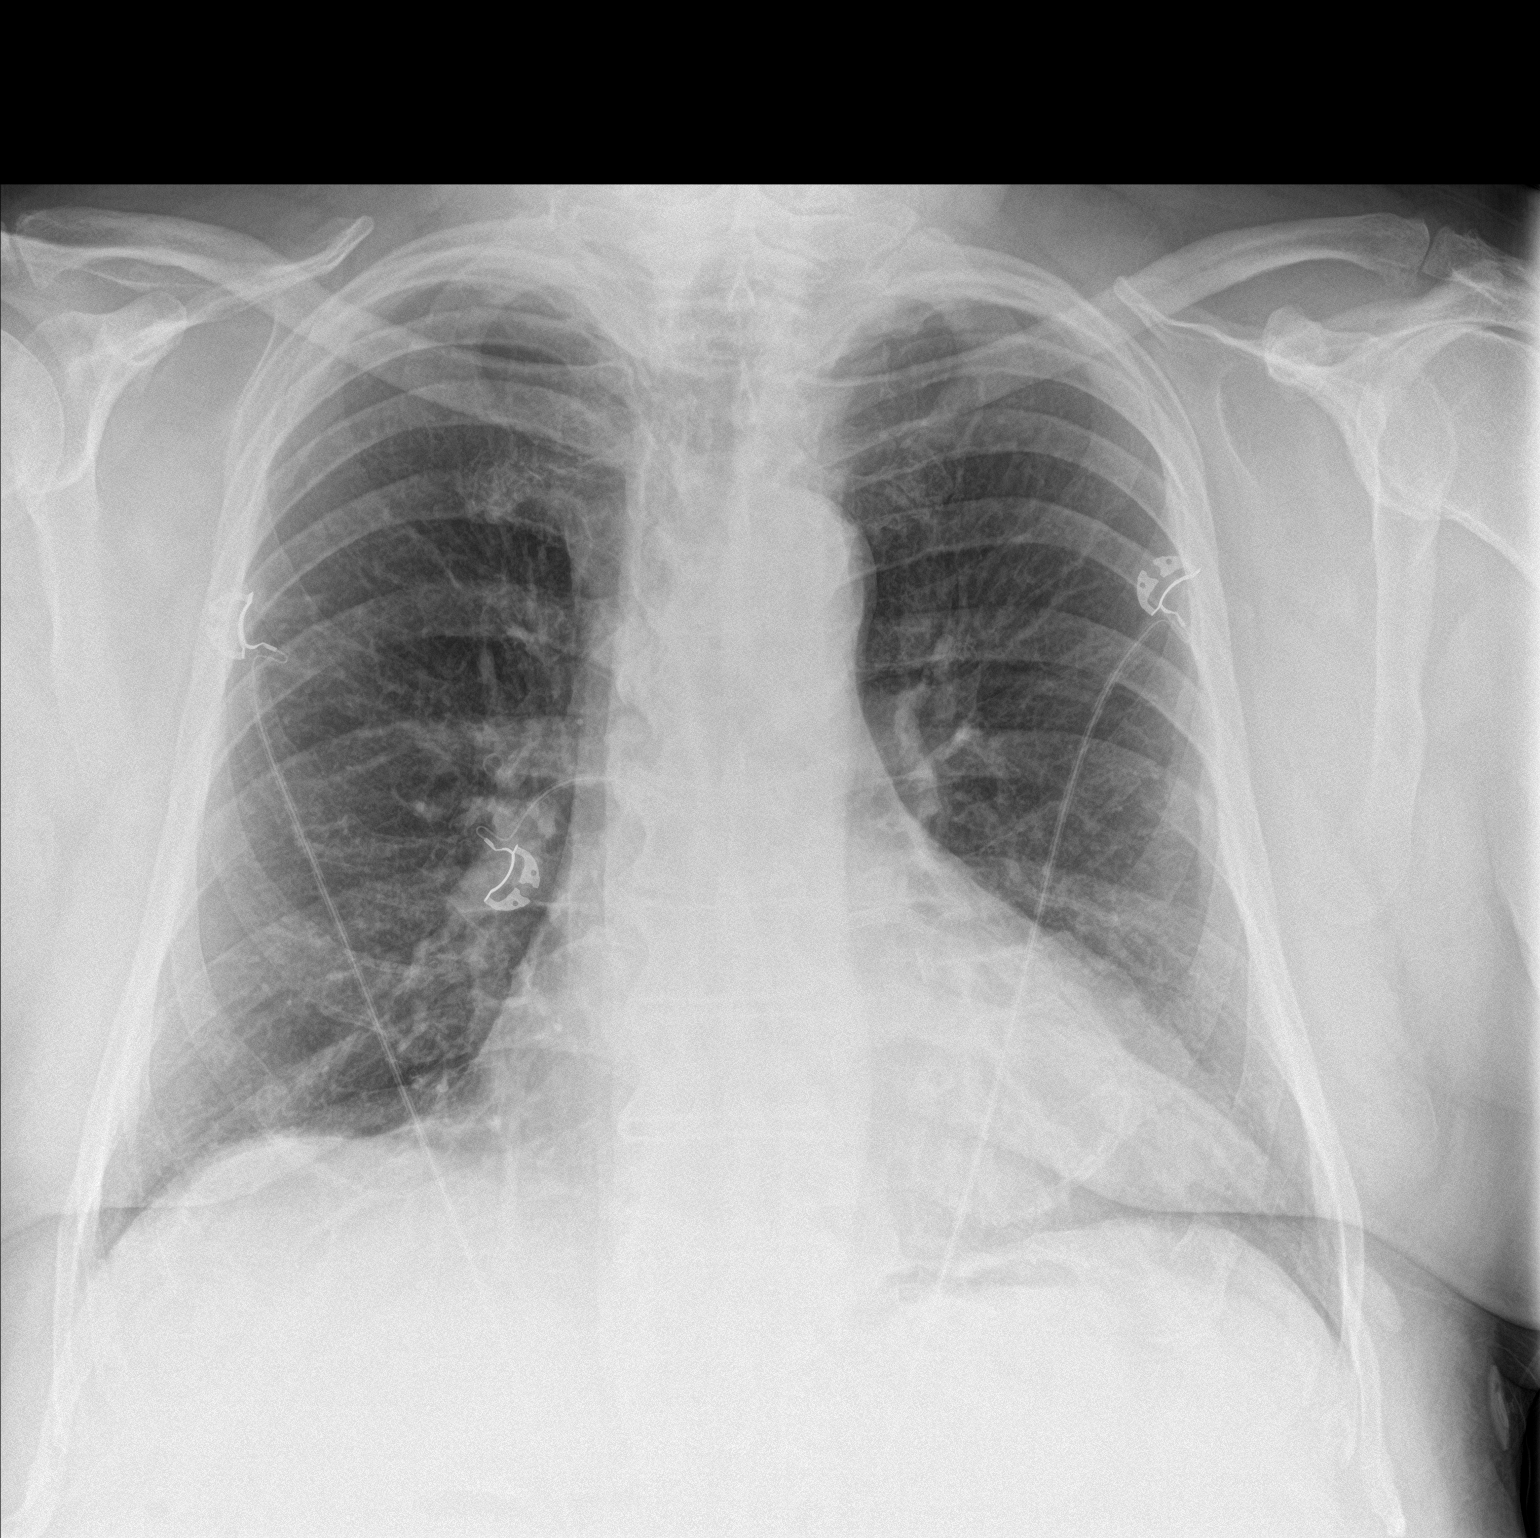

[chest lat]
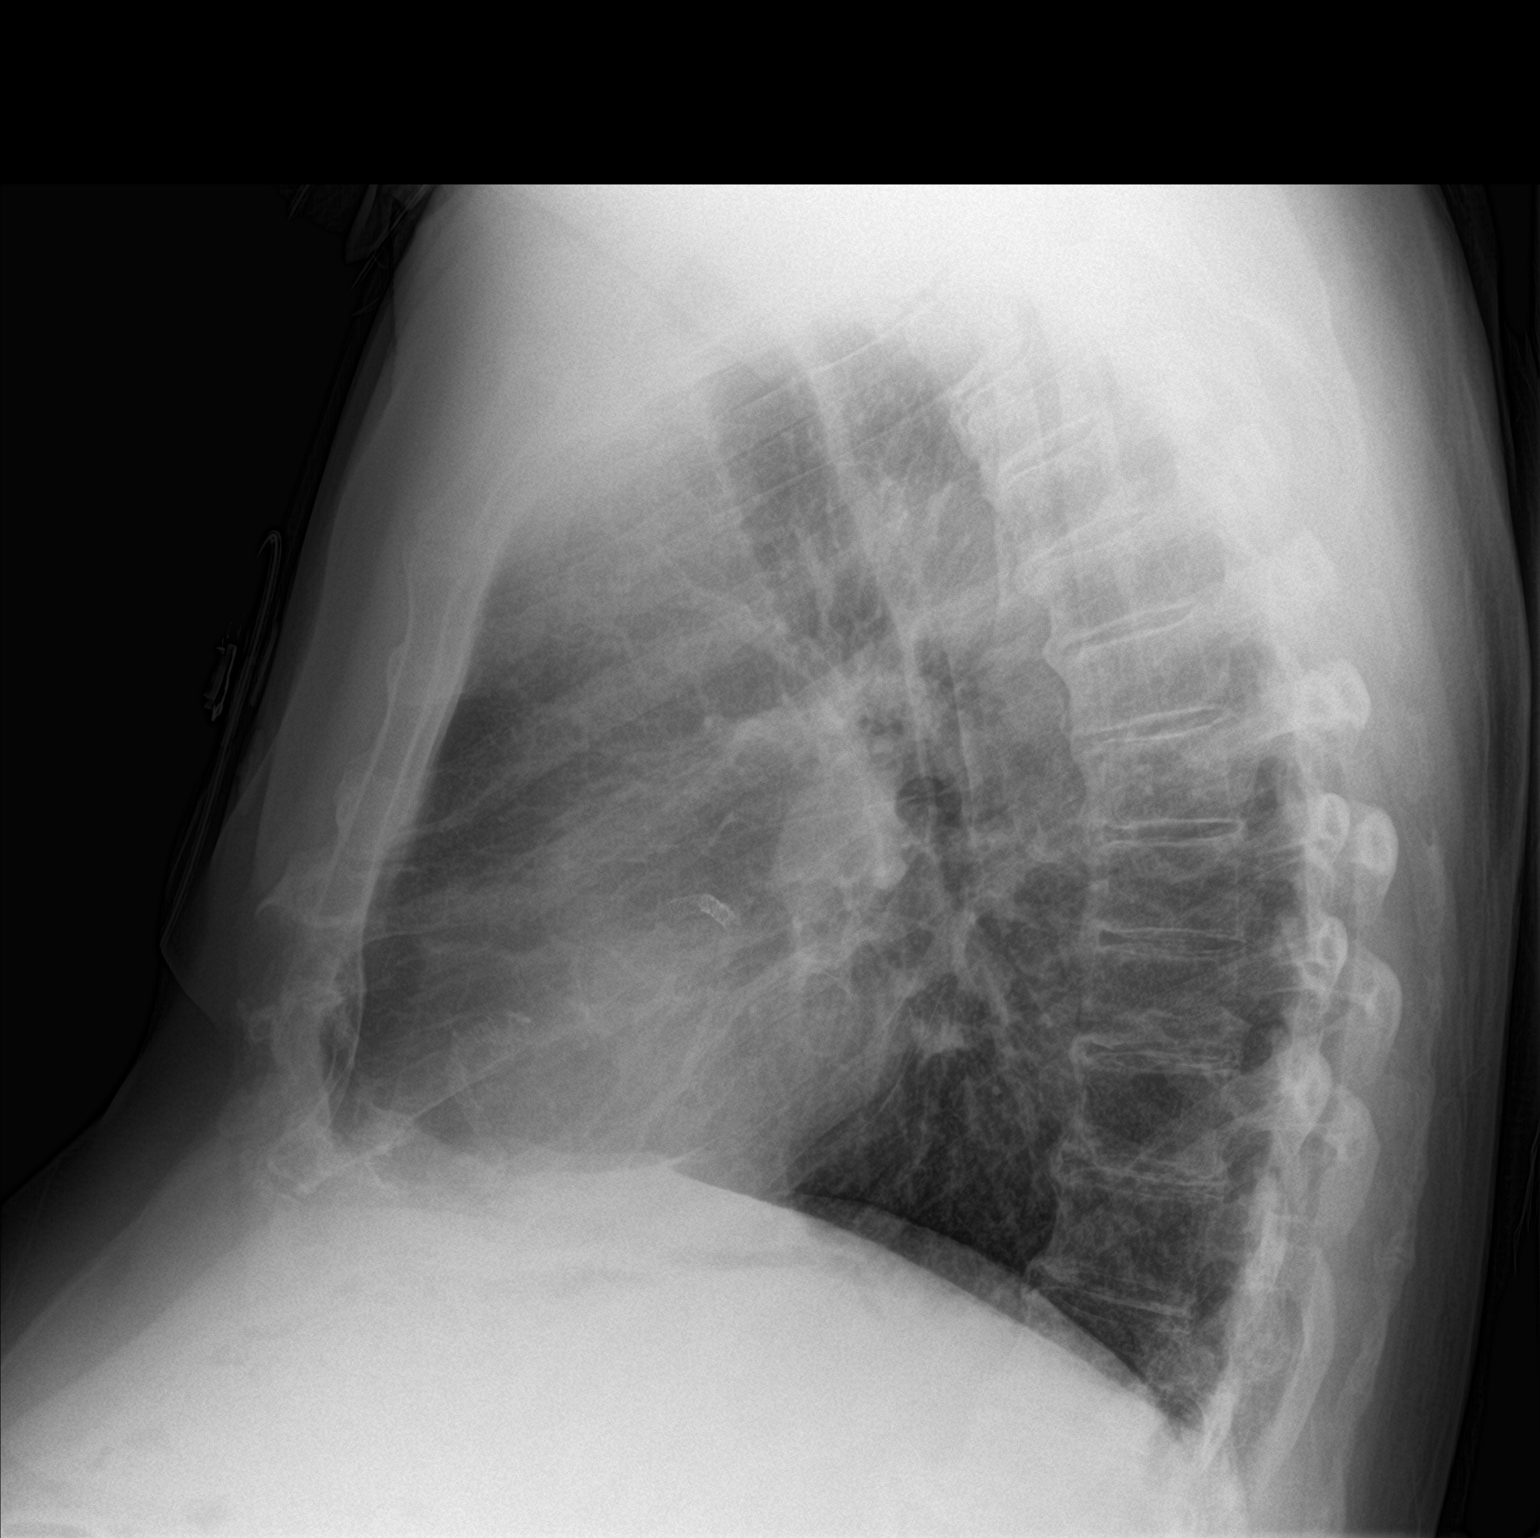

[2 of 2 positions shown; findings below may reference images not displayed]

FINDINGS: Heart is mildly enlarged. Lungs clear. No effusions or edema. No
acute bony abnormality.
IMPRESSION: Mild cardiomegaly.  No active disease.

## 2020-01-30 DIAGNOSIS — M4802 Spinal stenosis, cervical region: Secondary | ICD-10-CM | POA: Diagnosis not present

## 2020-02-28 ENCOUNTER — Other Ambulatory Visit: Payer: Self-pay

## 2020-02-28 MED ORDER — TRUE METRIX AIR GLUCOSE METER DEVI: 0 refills | Status: DC

## 2020-02-28 NOTE — Telephone Encounter (Signed)
error 

## 2020-03-01 DIAGNOSIS — M4802 Spinal stenosis, cervical region: Secondary | ICD-10-CM | POA: Diagnosis not present

## 2020-03-01 NOTE — Progress Notes (Signed)
HPI: FU CAD. Pt has had previous PCI of his LAD. Patient had admission October 2017 with NSTEMI. He had PCI of the circumflex and RCA with drug-eluting stents. Cardiac catheterization repeated May 2018because of dyspnea and chest pain. There was continued patency of stent segments in his RCA, left circumflex and LAD. Left ventricular end-diastolic pressure moderately elevated(23). Admitted with CHF and chest pain December 2019. Enzymes negative. Echocardiogram December 2019 showed normal LV function and mild left atrial enlargement. Nuclear study December 2019 showed no ischemia or infarction. Abdominal CT July 2020 showed 3.2 cm abdominal aortic aneurysm.  ABIs 3/21 normal. Since last seen,he has some dyspnea on exertion.  No orthopnea or PND.  No pedal edema.  Occasional nonexertional chest pain for 1-2 minutes unchanged but no exertional symptoms.  No syncope.  Current Outpatient Medications  Medication Sig Dispense Refill  . acetaminophen (TYLENOL) 500 MG tablet Take 500 mg by mouth 2 (two) times daily.     Marland Kitchen allopurinol (ZYLOPRIM) 300 MG tablet TAKE 1 TABLET EVERY DAY 90 tablet 1  . aspirin EC 81 MG tablet Take 81 mg by mouth daily.    Marland Kitchen atorvastatin (LIPITOR) 40 MG tablet TAKE 1 TABLET EVERY DAY 90 tablet 1  . B Complex Vitamins (B COMPLEX-B12 PO) Take 1 tablet by mouth daily.     . Blood Glucose Monitoring Suppl (TRUE METRIX AIR GLUCOSE METER) DEVI Use to check blood sugar. 1 each 0  . Chlorpheniramine Maleate (CHLOR-TRIMETON ALLERGY PO) Take 1 tablet by mouth every 6 (six) hours as needed (allergies).    . Cholecalciferol (VITAMIN D3) 25 MCG (1000 UT) CAPS Take 1,000 Units by mouth at bedtime.    Marland Kitchen dexamethasone (DECADRON) 1 MG tablet 2 tablets twice daily for 2 days, one tablet twice daily for 2 days, one tablet daily for 2 days. 15 tablet 0  . fenofibrate 160 MG tablet TAKE 1 TABLET (160 MG TOTAL) BY MOUTH AT BEDTIME. 90 tablet 1  . fluocinonide cream (LIDEX) [id number] % Apply 1  application topically daily as needed (for rashes).     . fluticasone (FLONASE) 50 MCG/ACT nasal spray Place 2 sprays into both nostrils daily. (Patient taking differently: Place 2 sprays into both nostrils daily as needed for allergies. ) 16 g 1  . furosemide (LASIX) 40 MG tablet Take 2 tablets (80 mg total) by mouth daily. 180 tablet 3  . HYDROcodone-acetaminophen (NORCO) 10-325 MG tablet Take 0.5 tablets by mouth 2 (two) times daily. 60 tablet 0  . insulin NPH Human (NOVOLIN N) 100 UNIT/ML injection Inject 0.5 mLs (50 Units total) into the skin 2 (two) times daily before a meal. 10 mL 11  . insulin regular (NOVOLIN R RELION) 100 units/mL injection Inject 25-30 units 3 times a day before meals. (Patient taking differently: Inject 25-30 Units into the skin 3 (three) times daily before meals. Inject 25-30 units 3 times a day before meals.) 60 mL 1  . Insulin Syringe-Needle U-100 (BD INSULIN SYRINGE U/F) 31G X 5/16" 0.3 ML MISC Use to inject insulin 5 times a day. (Patient taking differently: 1 each by Other route 5 (five) times daily. ) 500 each 11  . isosorbide mononitrate (IMDUR) 30 MG 24 hr tablet TAKE 1 TABLET (30 MG TOTAL) BY MOUTH DAILY. 90 tablet 3  . MAGNESIUM PO Take 1 tablet by mouth at bedtime.    . Melatonin 10 MG TABS Take 10 mg by mouth at bedtime.    . metoprolol tartrate (LOPRESSOR)  50 MG tablet TAKE 1 TABLET TWICE DAILY (Patient taking differently: Take 25 mg by mouth 2 (two) times daily. ) 180 tablet 1  . nitroGLYCERIN (NITROSTAT) 0.4 MG SL tablet Place 1 tablet (0.4 mg total) under the tongue every 5 (five) minutes as needed for chest pain. 100 tablet 3  . pantoprazole (PROTONIX) 20 MG tablet TAKE 1 TABLET (20 MG TOTAL) BY MOUTH DAILY. 90 tablet 1  . Probiotic Product (PROBIOTIC PO) Take 1 capsule by mouth at bedtime.     Marland Kitchen tiZANidine (ZANAFLEX) 4 MG tablet Take 4 mg by mouth 2 (two) times daily.  6  . TRUE METRIX BLOOD GLUCOSE TEST test strip TEST BLOOD SUGAR THREE TIMES DAILY AS  DIRECTED (Patient taking differently: 1 each by Other route in the morning, at noon, and at bedtime. ) 300 strip 11  . TRUEplus Lancets 33G MISC Use to check blood sugar 3 times a day (Patient taking differently: 1 each by Other route in the morning, at noon, and at bedtime. Use to check blood sugar 3 times a day) 300 each 11   No current facility-administered medications for this visit.     Past Medical History:  Diagnosis Date  . AAA (abdominal aortic aneurysm) ([place]) 12/15/2014   a. Mild aneurysmal dilatation of the infrarenal abdominal aorta 3.2 cm - f/u due by 2019.  . Arthritis    "all over" (07/30/2016)  . CAD (coronary artery disease)    a. stent to LAD 2000. b. possible spasm by cath 2001. c. IVUS/PTCA/DES to mLAD 05/2013. d. PTCA/DES of dRCA into ostial rPDA 07/2013. e. PTCA of OM2, DES to Cx, DES to [facility] 07/2016.  Marland Kitchen Chronic bronchitis ([place])   . Chronic diastolic CHF (congestive heart failure) ([place])   . Chronic lower back pain   . Chronic neck pain   . CKD (chronic kidney disease), stage III   . Diabetic peripheral neuropathy ([place])   . Ejection fraction    55%, 07/2010, mild inferior hypo  . GERD (gastroesophageal reflux disease)   . Gout   . H/O diverticulitis of colon 01/31/2015  . H/O hiatal hernia   . History of blood transfusion ~ 10/1940   "had pneumonia"  . History of kidney stones   . HTN (hypertension)   . Hyperlipidemia   . Melanoma of forearm, left ([place]) 02/2011   with wide excision   . Myocardial infarction ([place])   . Nephrolithiasis   . Obesity   . OSA on CPAP     [name] since 2000  . Peripheral neuropathy    both feet  . Positive D-dimer    a. significant elevation ,hospital 07/2010, etiology unclear. b. D Dimer chronically > 20.  Marland Kitchen Renal insufficiency   . Skin cancer   . Spinal stenosis    a. s/p surgical repair 2013  . Spinal stenosis of lumbar region   . Type II diabetes mellitus ([place])   . Ventral hernia     Past Surgical History:  Procedure  Laterality Date  . ANTERIOR LAT LUMBAR FUSION  10/22/2017   Procedure: Lumbar two-three Lumbar three-four Lumbar four-five Anteriolateral lumbar interbody fusion with percutaneous pedicle screw fixation and infuse;  Surgeon: [name], MD;  Location: [place];  Service: Neurosurgery;;  . APPLICATION OF ROBOTIC ASSISTANCE FOR SPINAL PROCEDURE  10/22/2017   Procedure: APPLICATION OF ROBOTIC ASSISTANCE FOR SPINAL PROCEDURE;  Surgeon: [name], MD;  Location: [place];  Service: Neurosurgery;;  . BACK SURGERY    . CARDIAC  CATHETERIZATION N/A 07/30/2016   Procedure: Left Heart Cath and Coronary Angiography;  Surgeon: Nelva Bush, MD;  Location: [place] CV LAB;  Service: Cardiovascular;  Laterality: N/A;  . CARDIAC CATHETERIZATION N/A 07/30/2016   Procedure: Coronary Stent Intervention;  Surgeon: Nelva Bush, MD;  Location: [place] CV LAB;  Service: Cardiovascular;  Laterality: N/A;  Mid CFX and MID RCA  . CARDIAC CATHETERIZATION N/A 07/30/2016   Procedure: Coronary Balloon Angioplasty;  Surgeon: Nelva Bush, MD;  Location: [place] CV LAB;  Service: Cardiovascular;  Laterality: N/A;  OM 1  . CARPAL TUNNEL RELEASE Left   . CATARACT EXTRACTION W/ INTRAOCULAR LENS  IMPLANT, BILATERAL Bilateral ~ 2014  . CERVICAL DISC SURGERY  1990s  . CORONARY ANGIOPLASTY WITH STENT PLACEMENT  05/2013  . CORONARY ANGIOPLASTY WITH STENT PLACEMENT  07/26/2013   DES to RCA extending to PDA    . CORONARY ANGIOPLASTY WITH STENT PLACEMENT  07/30/2016  . CORONARY ANGIOPLASTY WITH STENT PLACEMENT  2000   CAD  . DENTAL SURGERY  04/2016   "got infected; had to dig it out"  . EYE SURGERY    . KNEE ARTHROSCOPY Right 1990's   rt  . LEFT AND RIGHT HEART CATHETERIZATION WITH CORONARY ANGIOGRAM N/A 07/26/2013   Procedure: LEFT AND RIGHT HEART CATHETERIZATION WITH CORONARY ANGIOGRAM;  Surgeon: [name], MD;  Location: [place] CATH LAB;  Service: Cardiovascular;  Laterality: N/A;  . LEFT HEART CATH AND  CORONARY ANGIOGRAPHY N/A 02/26/2017   Procedure: Left Heart Cath and Coronary Angiography;  Surgeon: Sherren Mocha, MD;  Location: [place] CV LAB;  Service: Cardiovascular;  Laterality: N/A;  . LEFT HEART CATHETERIZATION WITH CORONARY ANGIOGRAM N/A 05/19/2013   Procedure: LEFT HEART CATHETERIZATION WITH CORONARY ANGIOGRAM;  Surgeon: [name], MD;  Location: [facility] CATH LAB;  Service: Cardiovascular;  Laterality: N/A;  . LUMBAR LAMINECTOMY/DECOMPRESSION MICRODISCECTOMY  08/30/2012   Procedure: LUMBAR LAMINECTOMY/DECOMPRESSION MICRODISCECTOMY 2 LEVELS;  Surgeon: [name], MD;  Location: [place] NEURO ORS;  Service: Neurosurgery;  Laterality: Bilateral;  Bilateral Lumbar three-four Lumbar four-five Laminotomies  . LUMBAR PERCUTANEOUS PEDICLE SCREW 1 LEVEL Bilateral 10/22/2017   Procedure: LUMBAR PERCUTANEOUS PEDICLE SCREW 1 LEVEL;  Surgeon: [name], MD;  Location: [place];  Service: Neurosurgery;  Laterality: Bilateral;  . LUMBAR SPINE SURGERY  04/07/2016   Dr. Ellene Route; "?ruptured disc"  . MELANOMA EXCISION Left 02/2011   forearm  . NASAL SINUS SURGERY  1970s   "cut windows in sinus pockets"  . PERCUTANEOUS CORONARY STENT INTERVENTION (PCI-S)  05/19/2013   Procedure: PERCUTANEOUS CORONARY STENT INTERVENTION (PCI-S);  Surgeon: [name], MD;  Location[facility] CATH LAB;  Service: Cardiovascular;;  . ULNAR TUNNEL RELEASE Left 04/07/2016   Dr. Ellene Route    Social History   Socioeconomic History  . Marital status: Married    Spouse name: Not on file  . Number of children: Not on file  . Years of education: Not on file  . Highest education level: Not on file  Occupational History  . Occupation: Painter  Tobacco Use  . Smoking status: Former Smoker    Packs/day: 3.00    Years: 30.00    Pack years: 90.00    Types: Cigarettes    Quit date: 09/17/1996    Years since quitting: 23.4  . Smokeless tobacco: Never Used  Substance and Sexual Activity  . Alcohol use: No  . Drug use: No  .  Sexual activity: Not on file  Other Topics Concern  . Not on file  Social History Narrative   Married and lives locally with his wife.  Sharyon Cable.   Social Determinants of Health   Financial Resource Strain:   . Difficulty of Paying Living Expenses:   Food Insecurity:   . Worried About Charity fundraiser in the Last Year:   . Arboriculturist in the Last Year:   Transportation Needs: No Transportation Needs  . Lack of Transportation (Medical): No  . Lack of Transportation (Non-Medical): No  Physical Activity:   . Days of Exercise per Week:   . Minutes of Exercise per Session:   Stress:   . Feeling of Stress :   Social Connections:   . Frequency of Communication with Friends and Family:   . Frequency of Social Gatherings with Friends and Family:   . Attends Religious Services:   . Active Member of Clubs or Organizations:   . Attends Archivist Meetings:   Marland Kitchen Marital Status:   Intimate Partner Violence:   . Fear of Current or Ex-Partner:   . Emotionally Abused:   Marland Kitchen Physically Abused:   . Sexually Abused:     Family History  Problem Relation Age of Onset  . Cancer Mother        intestinal   . Heart disease Mother   . Cancer Father        prostate  . Migraines Daughter   . Leukemia Sister   . Heart disease Sister   . Prostate cancer Other   . Kidney cancer Other   . Cancer Other        Bladder cancer  . Coronary artery disease Other   . Hypertension Sister   . Hypertension Brother   . Heart attack Neg Hx   . Stroke Neg Hx     ROS: Neck and back pain but no fevers or chills, productive cough, hemoptysis, dysphasia, odynophagia, melena, hematochezia, dysuria, hematuria, rash, seizure activity, orthopnea, PND, pedal edema, claudication. Remaining systems are negative.  Physical Exam: Well-developed well-nourished in no acute distress.  Skin is warm and dry.  HEENT is normal.  Neck is supple.  Chest is clear to auscultation with normal expansion.    Cardiovascular exam is regular rate and rhythm.  Abdominal exam nontender or distended. No masses palpated. Extremities show no edema. neuro grossly intact   A/P  1 CAD-no CP; continue ASA and statin.  2 Hypertension-BP controlled; continue present meds and follow.  3 Hyperlipidemia-continue statin.  4 AAA-FU ultrasound 7/21.  5 Chronic diastolic CHF-euvolemic on exam; continue lasix at present dose; continue low Na diet.   Kirk Ruths, MD

## 2020-03-05 ENCOUNTER — Other Ambulatory Visit: Payer: Self-pay | Admitting: Neurological Surgery

## 2020-03-05 ENCOUNTER — Other Ambulatory Visit (HOSPITAL_COMMUNITY): Payer: Self-pay | Admitting: Neurological Surgery

## 2020-03-05 DIAGNOSIS — M5416 Radiculopathy, lumbar region: Secondary | ICD-10-CM

## 2020-03-06 ENCOUNTER — Encounter: Payer: Self-pay | Admitting: Cardiology

## 2020-03-06 ENCOUNTER — Other Ambulatory Visit: Payer: Self-pay

## 2020-03-06 ENCOUNTER — Ambulatory Visit: Payer: Medicare HMO | Admitting: Cardiology

## 2020-03-06 VITALS — BP 122/56 | HR 74 | Ht 70.0 in | Wt 249.0 lb

## 2020-03-06 DIAGNOSIS — E7800 Pure hypercholesterolemia, unspecified: Secondary | ICD-10-CM | POA: Diagnosis not present

## 2020-03-06 DIAGNOSIS — I10 Essential (primary) hypertension: Secondary | ICD-10-CM

## 2020-03-06 DIAGNOSIS — I2510 Atherosclerotic heart disease of native coronary artery without angina pectoris: Secondary | ICD-10-CM

## 2020-03-06 DIAGNOSIS — I714 Abdominal aortic aneurysm, without rupture, unspecified: Secondary | ICD-10-CM

## 2020-03-06 NOTE — Patient Instructions (Signed)
Medication Instructions:  NO CHANGE *If you need a refill on your cardiac medications before your next appointment, please call your pharmacy*   Lab Work: If you have labs (blood work) drawn today and your tests are completely normal, you will receive your results only by: . MyChart Message (if you have MyChart) OR . A paper copy in the mail If you have any lab test that is abnormal or we need to change your treatment, we will call you to review the results.   Follow-Up: At [facility], you and your health needs are our priority.  As part of our continuing mission to provide you with exceptional heart care, we have created designated Provider Care Teams.  These Care Teams include your primary Cardiologist (physician) and Advanced Practice Providers (APPs -  Physician Assistants and Nurse Practitioners) who all work together to provide you with the care you need, when you need it.  We recommend signing up for the patient portal called "MyChart".  Sign up information is provided on this After Visit Summary.  MyChart is used to connect with patients for Virtual Visits (Telemedicine).  Patients are able to view lab/test results, encounter notes, upcoming appointments, etc.  Non-urgent messages can be sent to your provider as well.   To learn more about what you can do with MyChart, go to [email or web address].    Your next appointment:   6 month(s)  The format for your next appointment:   In Person  Provider:   Brian Crenshaw, MD    

## 2020-03-18 ENCOUNTER — Other Ambulatory Visit: Payer: Self-pay

## 2020-03-18 ENCOUNTER — Ambulatory Visit (HOSPITAL_COMMUNITY)
Admission: RE | Admit: 2020-03-18 | Discharge: 2020-03-18 | Disposition: A | Discharge status: Home / Self Care | Payer: Medicare HMO | Source: Ambulatory Visit | Attending: Neurological Surgery | Admitting: Neurological Surgery

## 2020-03-18 DIAGNOSIS — M5116 Intervertebral disc disorders with radiculopathy, lumbar region: Secondary | ICD-10-CM | POA: Diagnosis not present

## 2020-03-18 DIAGNOSIS — M48061 Spinal stenosis, lumbar region without neurogenic claudication: Secondary | ICD-10-CM | POA: Diagnosis not present

## 2020-03-18 DIAGNOSIS — M5117 Intervertebral disc disorders with radiculopathy, lumbosacral region: Secondary | ICD-10-CM | POA: Diagnosis not present

## 2020-03-18 DIAGNOSIS — I714 Abdominal aortic aneurysm, without rupture: Secondary | ICD-10-CM | POA: Insufficient documentation

## 2020-03-18 DIAGNOSIS — M5416 Radiculopathy, lumbar region: Secondary | ICD-10-CM | POA: Diagnosis not present

## 2020-03-18 DIAGNOSIS — I700 Atherosclerosis of aorta: Secondary | ICD-10-CM | POA: Diagnosis not present

## 2020-03-18 LAB — GLUCOSE, CAPILLARY: Glucose-Capillary: 109 mg/dL — ABNORMAL HIGH (ref 70–99)

## 2020-03-18 MED ORDER — DIAZEPAM 5 MG PO TABS
ORAL_TABLET | ORAL | Status: AC
  Filled 2020-03-18: qty 2

## 2020-03-18 MED ORDER — ONDANSETRON HCL 4 MG/2ML IJ SOLN: 4.0000 mg | Freq: Four times a day (QID) | INTRAMUSCULAR | Status: DC | PRN

## 2020-03-18 MED ORDER — LIDOCAINE HCL (PF) 1 % IJ SOLN
5.0000 mL | Freq: Once | INTRAMUSCULAR | Status: AC
  Administered 2020-03-18: 5 mL via INTRADERMAL

## 2020-03-18 MED ORDER — OXYCODONE-ACETAMINOPHEN 5-325 MG PO TABS
ORAL_TABLET | ORAL | Status: AC
  Administered 2020-03-18: 1 via ORAL
  Filled 2020-03-18: qty 1

## 2020-03-18 MED ORDER — OXYCODONE-ACETAMINOPHEN 5-325 MG PO TABS: 1.0000 | ORAL_TABLET | ORAL | Status: DC | PRN

## 2020-03-18 MED ORDER — DIAZEPAM 5 MG PO TABS
10.0000 mg | ORAL_TABLET | Freq: Once | ORAL | Status: AC
  Administered 2020-03-18: 10 mg via ORAL

## 2020-03-18 MED ORDER — INSULIN ASPART 100 UNIT/ML ~~LOC~~ SOLN: 0.0000 [IU] | SUBCUTANEOUS | Status: DC

## 2020-03-18 MED ORDER — IOHEXOL 180 MG/ML  SOLN
20.0000 mL | Freq: Once | INTRAMUSCULAR | Status: AC | PRN
  Administered 2020-03-18: 12 mL via INTRATHECAL

## 2020-03-18 NOTE — Procedures (Signed)
William Hendrix is a 79 year old individual who has had significant back issues he is undergone a previous decompression L2-3 L3-4 and L4-5.  Is undergone a fusion procedure there also he has significant problems with leg weakness he is diabetic and has diabetic neuropathy but a myelogram is been suggested to rule out possibility of any residual stenosis at levels in or around the areas of his back where he has been having problems.  Pre op Dx: Lumbar radiculopathy, chronic lumbar degenerative disc disease Post op Dx: Same Procedure: Lumbar myelogram Surgeon: Elonna Mcfarlane Puncture level: L2-3 Fluid color: Clear colorless Injection: Iohexol 180, 12 cc Findings: Mild stenosis L5-S1 in the lateral recesses, further evaluation with CT scan, L2-L5 well decompressed and stabilized.  No evidence of significant stenosis at the thoracolumbar junction based on myelogram further evaluation with CT scanning.

## 2020-03-18 NOTE — Discharge Instructions (Signed)
Myelogram  A myelogram is an imaging study of the spinal cord and the places where nerves attach to the spinal cord (nerve roots). A dye (contrast material) is injected into the spine before the X-ray. This provides a clearer image for your health care provider to see. You may need this study done if you have a spinal cord problem that cannot be diagnosed with other imaging studies, such as a CT scan or an MRI. You may also have this study to check your spine after surgery. Tell a health care provider about:  Any allergies you have, especially to iodine.  All medicines you are taking, including vitamins, herbs, eye drops, creams, and over-the-counter medicines.  Any problems you or family members have had with anesthetic medicines or contrast material.  Any blood disorders you have.  Any surgeries you have had.  Any medical conditions you have or have had, including asthma.  Whether you are pregnant or may be pregnant. What are the risks? Generally, this is a safe procedure. However, problems may occur, including:  Infection.  Bleeding.  Allergic reaction to medicines or dyes.  Damage to your spinal cord or nerves.  Loss or leaking of spinal fluid. This can lead to headaches.  Damage to kidneys.  Seizures. This is rare. What happens before the procedure?  Follow instructions from your health care provider about eating or drinking restrictions. You may be asked to drink more fluids.  Ask your health care provider about changing or stopping your regular medicines. This is especially important if you are taking diabetes medicines or blood thinners.  Plan to have someone take you home from the hospital or clinic.  If you will be going home right after the procedure, plan to have someone with you for 24 hours. What happens during the procedure?  You will lie face down on a table.  Your health care provider will locate the best injection site on your spine. This is most  often in the lower back.  The area of injection will be washed with soap.  You will be given a medicine to numb the area (local anesthetic).  Your health care provider will insert a long needle into the space around your spinal cord (subarachnoid space).  A sample of spinal fluid may be taken and sent to the lab for testing.  The contrast material will be injected into the subarachnoid space.  The exam table may be tilted to help the contrast material flow up or down your spine.  The X-ray will take images of your spinal cord for your health care provider to examine.  A bandage (dressing) may be placed over the injection site. The procedure may vary among health care providers and hospitals. What can I expect after this procedure?  Your blood pressure, heart rate, breathing rate, and blood oxygen level may be monitored until you leave the hospital or clinic.  You may have: ? Soreness on your injection site. ? A mild headache.  You will be asked to lie down with your head raised (elevated). This reduces the risk of a headache.  It is up to you to get the results of your procedure. Ask your health care provider, or the department that is doing the procedure, when your results will be ready. Follow these instructions at home:   Rest as told by your health care provider. Lie flat with your head slightly elevated to reduce the risk of a headache.  Do not bend, lift, or do hard work   for 24-48 hours, or as told by your health care provider.  Take over-the-counter and prescription medicines only as told by your health care provider.  Take care of your dressing as told by your health care provider.  Drink enough fluid to keep your urine pale yellow.  Bathe or shower as told by your health care provider. Contact a health care provider if:  You have a fever.  You have a headache that lasts longer than 24 hours.  You feel nauseous or vomit.  You have a stiff neck or numbness in  your legs.  You are unable to urinate or have a bowel movement.  You develop a rash, itching, or sneezing. Get help right away if:  You have new symptoms or your symptoms get worse.  You have a seizure.  You have trouble breathing. Summary  A myelogram is an imaging study of the spinal cord and the places where nerves attach to the spinal cord (nerve roots).  Before the procedure, follow instructions from your health care provider about changing or stopping your regular medicines, and eating and drinking restrictions.  After this procedure, you will be asked to lie down with your head raised (elevated). This reduces your risk of a headache.  Do not bend, lift, or do hard work for 24-48 hours, or as told by your health care provider.  Contact a health care provider if you have a stiff neck or numbness in your legs. Get help right away if symptoms get worse, or you have a seizure or trouble breathing. This information is not intended to replace advice given to you by your health care provider. Make sure you discuss any questions you have with your health care provider. Document Revised: 12/07/2018 Document Reviewed: 12/08/2018 Elsevier Patient Education  2020 [employer]  

## 2020-03-21 DIAGNOSIS — N401 Enlarged prostate with lower urinary tract symptoms: Secondary | ICD-10-CM | POA: Diagnosis not present

## 2020-03-21 DIAGNOSIS — Z87442 Personal history of urinary calculi: Secondary | ICD-10-CM | POA: Diagnosis not present

## 2020-03-21 DIAGNOSIS — R3912 Poor urinary stream: Secondary | ICD-10-CM | POA: Diagnosis not present

## 2020-04-12 ENCOUNTER — Other Ambulatory Visit: Payer: Self-pay | Admitting: Family Medicine

## 2020-04-17 ENCOUNTER — Other Ambulatory Visit: Payer: Self-pay | Admitting: *Deleted

## 2020-04-17 DIAGNOSIS — I714 Abdominal aortic aneurysm, without rupture, unspecified: Secondary | ICD-10-CM

## 2020-04-17 NOTE — Progress Notes (Signed)
vas 

## 2020-04-26 ENCOUNTER — Other Ambulatory Visit: Payer: Self-pay | Admitting: Family Medicine

## 2020-04-30 ENCOUNTER — Other Ambulatory Visit: Payer: Self-pay

## 2020-04-30 ENCOUNTER — Encounter: Payer: Self-pay | Admitting: Internal Medicine

## 2020-04-30 ENCOUNTER — Ambulatory Visit: Payer: Medicare HMO | Admitting: Internal Medicine

## 2020-04-30 VITALS — BP 118/60 | HR 77 | Ht 70.5 in | Wt 251.0 lb

## 2020-04-30 DIAGNOSIS — E785 Hyperlipidemia, unspecified: Secondary | ICD-10-CM | POA: Diagnosis not present

## 2020-04-30 DIAGNOSIS — Z6837 Body mass index (BMI) 37.0-37.9, adult: Secondary | ICD-10-CM

## 2020-04-30 DIAGNOSIS — E1159 Type 2 diabetes mellitus with other circulatory complications: Secondary | ICD-10-CM

## 2020-04-30 DIAGNOSIS — Z794 Long term (current) use of insulin: Secondary | ICD-10-CM

## 2020-04-30 LAB — POCT GLYCOSYLATED HEMOGLOBIN (HGB A1C): Hemoglobin A1C: 6.7 % — AB (ref 4.0–5.6)

## 2020-04-30 NOTE — Progress Notes (Signed)
Patient ID: William Hendrix, male   DOB: 08/02/1941, 79 y.o.   MRN: [id number]  This visit occurred during the SARS-CoV-2 public health emergency.  Safety protocols were in place, including screening questions prior to the visit, additional usage of staff PPE, and extensive cleaning of exam room while observing appropriate contact time as indicated for disinfecting solutions.   HPI: William Hendrix is a 79 y.o.-year-old male, returning for f/u for DM2, dx 2009, insulin-dependent, uncontrolled, with complications (CAD s/p AMI 2017, CKD stage 3). Last visit 4 months ago.  In 2018, he eliminated meat almost completely and his sugars improved significantly while he also lost weight.  He continues to eat many vegetables, but he reintroduced meat.  With this, his insulin requirements increased.  He lost more weight since last visit.   Reviewed HbA1c levels: Lab Results  Component Value Date   HGBA1C 8.0 (H) 11/21/2019   HGBA1C 7.2 (H) 08/14/2019   HGBA1C 6.7 (H) 05/11/2019   He is on: ReliOn insulin: Insulin Before breakfast Before lunch Before dinner  Regular 25-30 >> 30 25-30 >> 30 25-30 >> 30 (if sugars <100, take 15 units)  NPH 45 >> 50  45 >> 50  If you take dinnertime insulin after dinner, take 50% of the dose.  We cannot use Metformin 2/2 CKD. We stopped Januvia >> could not afford ($100/3 mo)  We cannot use Levemir >> developed a severe skin reaction We stopped Amaryl 4 mg.  He checks sugars 3 times a day per review of his log: - am: 148, 167-251, 305, 311 >> 81, 87 108-193, 202, 221 - Before lunch: 119-176, 193 >> 163-265 >> 97-142, 231 - before dinner: 95,103-210 >> 111, 195-326 >> 73, 88-209, 220 - Before bedtime: 57, 120-145 >> 180-201 >> n/c Lowest sugar was   51 >> 70 >> 88 >> 111 >> 73; it is unclear at which level he has hypoglycemia awareness Highest sugar was  212 >> 326 >> 231.  Meals: - Breakfast: oatmeal + toast + tomatoes - Lunch: beans  - Dinner:home cooked  veggies   + CKD, last BUN/creatinine was:  Lab Results  Component Value Date   BUN 25 (H) 01/09/2020   CREATININE 1.58 (H) 01/09/2020   ACR levels normal: Lab Results  Component Value Date   MICRALBCREAT 5.1 08/02/2014   MICRALBCREAT 0.7 02/23/2013   Reviewed GFR levels: Lab Results  Component Value Date   GFRAA 48 (L) 01/09/2020   GFRAA 49 (L) 12/13/2019   GFRAA 48 (L) 04/22/2019   GFRAA 44 (L) 12/29/2018   GFRAA 39 (L) 11/12/2018   GFRAA 32 (L) 11/11/2018   GFRAA 44 (L) 09/16/2018   GFRAA 43 (L) 09/15/2018   GFRAA 46 (L) 08/21/2018   GFRAA 45 (L) 07/25/2018   + HL; latest lipids: Lab Results  Component Value Date   CHOL 113 11/21/2019   HDL 21.50 (L) 11/21/2019   LDLCALC 53 05/11/2019   LDLDIRECT 57.0 11/21/2019   TRIG 250.0 (H) 11/21/2019   CHOLHDL 5 11/21/2019  On Lipitor, fenofibrate, flaxseed oil  Pt's last eye exam was 11/2019: No DR. Had cataract sx in 2011.  No numbness and tingling in his legs.  He had back surgery in 03/2016 and 10/2017.  He developed a kidney stone after his last surgery. He was admitted 2x in 2017 for: CP/SOB >> AMI.  He had 2 stents placed then. He had a cardiac cath 02/2017 >> no new blockages He was seen emergency room with  colitis 04/22/2019.    PMH: Pt also also has a history of CAD, HTN, CKD stage III, history of nephrolithiasis, hiatal hernia, OSA, obesity, melanoma, spinal stenosis.  ROS: Constitutional: no weight gain/+ weight loss, no fatigue, no subjective hyperthermia, no subjective hypothermia Eyes: no blurry vision, no xerophthalmia ENT: no sore throat, no nodules palpated in neck, no dysphagia, no odynophagia, no hoarseness Cardiovascular: no CP/no SOB/no palpitations/no leg swelling Respiratory: no cough/no SOB/no wheezing Gastrointestinal: no N/no V/no D/no C/no acid reflux Musculoskeletal: no muscle aches/+ joint aches Skin: no rashes, no hair loss Neurological: no tremors/no numbness/no tingling/no  dizziness  I reviewed pt's medications, allergies, PMH, social hx, family hx, and changes were documented in the history of present illness. Otherwise, unchanged from my initial visit note.  Past Medical History:  Diagnosis Date  . AAA (abdominal aortic aneurysm) ([place]) 12/15/2014   a. Mild aneurysmal dilatation of the infrarenal abdominal aorta 3.2 cm - f/u due by 2019.  . Arthritis    "all over" (07/30/2016)  . CAD (coronary artery disease)    a. stent to LAD 2000. b. possible spasm by cath 2001. c. IVUS/PTCA/DES to mLAD 05/2013. d. PTCA/DES of dRCA into ostial rPDA 07/2013. e. PTCA of OM2, DES to Cx, DES to [facility] 07/2016.  Marland Kitchen Chronic bronchitis ([place])   . Chronic diastolic CHF (congestive heart failure) ([place])   . Chronic lower back pain   . Chronic neck pain   . CKD (chronic kidney disease), stage III   . Diabetic peripheral neuropathy ([place])   . Ejection fraction    55%, 07/2010, mild inferior hypo  . GERD (gastroesophageal reflux disease)   . Gout   . H/O diverticulitis of colon 01/31/2015  . H/O hiatal hernia   . History of blood transfusion ~ 10/1940   "had pneumonia"  . History of kidney stones   . HTN (hypertension)   . Hyperlipidemia   . Melanoma of forearm, left ([place]) 02/2011   with wide excision   . Myocardial infarction ([place])   . Nephrolithiasis   . Obesity   . OSA on CPAP     [name] since 2000  . Peripheral neuropathy    both feet  . Positive D-dimer    a. significant elevation ,hospital 07/2010, etiology unclear. b. D Dimer chronically > 20.  Marland Kitchen Renal insufficiency   . Skin cancer   . Spinal stenosis    a. s/p surgical repair 2013  . Spinal stenosis of lumbar region   . Type II diabetes mellitus ([place])   . Ventral hernia    Past Surgical History:  Procedure Laterality Date  . ANTERIOR LAT LUMBAR FUSION  10/22/2017   Procedure: Lumbar two-three Lumbar three-four Lumbar four-five Anteriolateral lumbar interbody fusion with percutaneous pedicle screw fixation and  infuse;  Surgeon: [name], MD;  Location: [place];  Service: Neurosurgery;;  . APPLICATION OF ROBOTIC ASSISTANCE FOR SPINAL PROCEDURE  10/22/2017   Procedure: APPLICATION OF ROBOTIC ASSISTANCE FOR SPINAL PROCEDURE;  Surgeon: [name], MD;  Location: [place];  Service: Neurosurgery;;  . BACK SURGERY    . CARDIAC CATHETERIZATION N/A 07/30/2016   Procedure: Left Heart Cath and Coronary Angiography;  Surgeon: Nelva Bush, MD;  Location: [place] CV LAB;  Service: Cardiovascular;  Laterality: N/A;  . CARDIAC CATHETERIZATION N/A 07/30/2016   Procedure: Coronary Stent Intervention;  Surgeon: Nelva Bush, MD;  Location: [place] CV LAB;  Service: Cardiovascular;  Laterality: N/A;  Mid CFX and MID RCA  . CARDIAC CATHETERIZATION  N/A 07/30/2016   Procedure: Coronary Balloon Angioplasty;  Surgeon: Nelva Bush, MD;  Location: [place] CV LAB;  Service: Cardiovascular;  Laterality: N/A;  OM 1  . CARPAL TUNNEL RELEASE Left   . CATARACT EXTRACTION W/ INTRAOCULAR LENS  IMPLANT, BILATERAL Bilateral ~ 2014  . CERVICAL DISC SURGERY  1990s  . CORONARY ANGIOPLASTY WITH STENT PLACEMENT  05/2013  . CORONARY ANGIOPLASTY WITH STENT PLACEMENT  07/26/2013   DES to RCA extending to PDA    . CORONARY ANGIOPLASTY WITH STENT PLACEMENT  07/30/2016  . CORONARY ANGIOPLASTY WITH STENT PLACEMENT  2000   CAD  . DENTAL SURGERY  04/2016   "got infected; had to dig it out"  . EYE SURGERY    . KNEE ARTHROSCOPY Right 1990's   rt  . LEFT AND RIGHT HEART CATHETERIZATION WITH CORONARY ANGIOGRAM N/A 07/26/2013   Procedure: LEFT AND RIGHT HEART CATHETERIZATION WITH CORONARY ANGIOGRAM;  Surgeon: [name], MD;  Location: [place] CATH LAB;  Service: Cardiovascular;  Laterality: N/A;  . LEFT HEART CATH AND CORONARY ANGIOGRAPHY N/A 02/26/2017   Procedure: Left Heart Cath and Coronary Angiography;  Surgeon: Sherren Mocha, MD;  Location: [place] CV LAB;  Service: Cardiovascular;  Laterality: N/A;  . LEFT  HEART CATHETERIZATION WITH CORONARY ANGIOGRAM N/A 05/19/2013   Procedure: LEFT HEART CATHETERIZATION WITH CORONARY ANGIOGRAM;  Surgeon: [name], MD;  Location: [facility] CATH LAB;  Service: Cardiovascular;  Laterality: N/A;  . LUMBAR LAMINECTOMY/DECOMPRESSION MICRODISCECTOMY  08/30/2012   Procedure: LUMBAR LAMINECTOMY/DECOMPRESSION MICRODISCECTOMY 2 LEVELS;  Surgeon: [name], MD;  Location: [place] NEURO ORS;  Service: Neurosurgery;  Laterality: Bilateral;  Bilateral Lumbar three-four Lumbar four-five Laminotomies  . LUMBAR PERCUTANEOUS PEDICLE SCREW 1 LEVEL Bilateral 10/22/2017   Procedure: LUMBAR PERCUTANEOUS PEDICLE SCREW 1 LEVEL;  Surgeon: [name], MD;  Location: [place];  Service: Neurosurgery;  Laterality: Bilateral;  . LUMBAR SPINE SURGERY  04/07/2016   Dr. Ellene Route; "?ruptured disc"  . MELANOMA EXCISION Left 02/2011   forearm  . NASAL SINUS SURGERY  1970s   "cut windows in sinus pockets"  . PERCUTANEOUS CORONARY STENT INTERVENTION (PCI-S)  05/19/2013   Procedure: PERCUTANEOUS CORONARY STENT INTERVENTION (PCI-S);  Surgeon: [name], MD;  Location: [facility] CATH LAB;  Service: Cardiovascular;;  . ULNAR TUNNEL RELEASE Left 04/07/2016   Dr. Ellene Route   Social History   Socioeconomic History  . Marital status: Married    Spouse name: Not on file  . Number of children: Not on file  . Years of education: Not on file  . Highest education level: Not on file  Occupational History  . Occupation: Painter  Tobacco Use  . Smoking status: Former Smoker    Packs/day: 3.00    Years: 30.00    Pack years: 90.00    Types: Cigarettes    Quit date: 09/17/1996    Years since quitting: 23.6  . Smokeless tobacco: Never Used  Vaping Use  . Vaping Use: Never used  Substance and Sexual Activity  . Alcohol use: No  . Drug use: No  . Sexual activity: Not on file  Other Topics Concern  . Not on file  Social History Narrative   Married and lives locally with his wife.  Sharyon Cable.   Social  Determinants of Health   Financial Resource Strain:   . Difficulty of Paying Living Expenses:   Food Insecurity:   . Worried About Charity fundraiser in the Last Year:   . [place] in the Last  Year:   Transportation Needs: No Transportation Needs  . Lack of Transportation (Medical): No  . Lack of Transportation (Non-Medical): No  Physical Activity:   . Days of Exercise per Week:   . Minutes of Exercise per Session:   Stress:   . Feeling of Stress :   Social Connections:   . Frequency of Communication with Friends and Family:   . Frequency of Social Gatherings with Friends and Family:   . Attends Religious Services:   . Active Member of Clubs or Organizations:   . Attends Archivist Meetings:   Marland Kitchen Marital Status:   Intimate Partner Violence:   . Fear of Current or Ex-Partner:   . Emotionally Abused:   Marland Kitchen Physically Abused:   . Sexually Abused:    Current Outpatient Medications on File Prior to Visit  Medication Sig Dispense Refill  . acetaminophen (TYLENOL) 500 MG tablet Take 500 mg by mouth 2 (two) times daily.     Marland Kitchen allopurinol (ZYLOPRIM) 300 MG tablet TAKE 1 TABLET EVERY DAY 90 tablet 1  . aspirin EC 81 MG tablet Take 81 mg by mouth daily.    Marland Kitchen atorvastatin (LIPITOR) 40 MG tablet TAKE 1 TABLET EVERY DAY 90 tablet 1  . B Complex Vitamins (B COMPLEX-B12 PO) Take 1 tablet by mouth daily.     . Blood Glucose Monitoring Suppl (TRUE METRIX AIR GLUCOSE METER) DEVI Use to check blood sugar. 1 each 0  . Chlorpheniramine Maleate (CHLOR-TRIMETON ALLERGY PO) Take 1 tablet by mouth every 6 (six) hours as needed (allergies).    . Cholecalciferol (VITAMIN D3) 25 MCG (1000 UT) CAPS Take 1,000 Units by mouth at bedtime.    Marland Kitchen dexamethasone (DECADRON) 1 MG tablet 2 tablets twice daily for 2 days, one tablet twice daily for 2 days, one tablet daily for 2 days. 15 tablet 0  . fenofibrate 160 MG tablet TAKE 1 TABLET (160 MG TOTAL) BY MOUTH AT BEDTIME. 90 tablet 1  . fluocinonide  cream (LIDEX) 7.61 % Apply 1 application topically daily as needed (for rashes).     . fluticasone (FLONASE) 50 MCG/ACT nasal spray Place 2 sprays into both nostrils daily. (Patient taking differently: Place 2 sprays into both nostrils daily as needed for allergies. ) 16 g 1  . furosemide (LASIX) 40 MG tablet Take 2 tablets (80 mg total) by mouth daily. 180 tablet 3  . HYDROcodone-acetaminophen (NORCO) 10-325 MG tablet Take 0.5 tablets by mouth 2 (two) times daily. 60 tablet 0  . insulin NPH Human (NOVOLIN N) 100 UNIT/ML injection Inject 0.5 mLs (50 Units total) into the skin 2 (two) times daily before a meal. 10 mL 11  . insulin regular (NOVOLIN R RELION) 100 units/mL injection Inject 25-30 units 3 times a day before meals. (Patient taking differently: Inject 25-30 Units into the skin 3 (three) times daily before meals. Inject 25-30 units 3 times a day before meals.) 60 mL 1  . Insulin Syringe-Needle U-100 (BD INSULIN SYRINGE U/F) 31G X 5/16" 0.3 ML MISC Use to inject insulin 5 times a day. (Patient taking differently: 1 each by Other route 5 (five) times daily. ) 500 each 11  . isosorbide mononitrate (IMDUR) 30 MG 24 hr tablet TAKE 1 TABLET (30 MG TOTAL) BY MOUTH DAILY. 90 tablet 3  . MAGNESIUM PO Take 1 tablet by mouth at bedtime.    . Melatonin 10 MG TABS Take 10 mg by mouth at bedtime.    . metoprolol tartrate (LOPRESSOR) 50 MG  tablet TAKE 1 TABLET TWICE DAILY 180 tablet 1  . nitroGLYCERIN (NITROSTAT) 0.4 MG SL tablet Place 1 tablet (0.4 mg total) under the tongue every 5 (five) minutes as needed for chest pain. 100 tablet 3  . pantoprazole (PROTONIX) 20 MG tablet Take 1 tablet (20 mg total) by mouth daily. 90 tablet 3  . Probiotic Product (PROBIOTIC PO) Take 1 capsule by mouth at bedtime.     Marland Kitchen tiZANidine (ZANAFLEX) 4 MG tablet Take 4 mg by mouth 2 (two) times daily.  6  . TRUE METRIX BLOOD GLUCOSE TEST test strip TEST BLOOD SUGAR THREE TIMES DAILY AS DIRECTED (Patient taking differently: 1 each  by Other route in the morning, at noon, and at bedtime. ) 300 strip 11  . TRUEplus Lancets 33G MISC Use to check blood sugar 3 times a day (Patient taking differently: 1 each by Other route in the morning, at noon, and at bedtime. Use to check blood sugar 3 times a day) 300 each 11   No current facility-administered medications on file prior to visit.   Allergies  Allergen Reactions  . Brilinta [Ticagrelor] Shortness Of Breath  . Insulin Detemir Swelling, Other (See Comments) and Itching    Patient had redness and swelling and tenderness at injection site. Patient had redness and swelling and tenderness at injection site.  . Bee Venom   . Codeine Other (See Comments)    Makes him "shakey", is OK with hydrocodone GI UPSET & TREMORS Makes him "shakey", is OK with hydrocodone Makes him "shakey", is OK with hydrocodone GI UPSET & TREMORS  . Lipitor [Atorvastatin] Other (See Comments)    Muscle aches; affects liver function- Patient is currently taking  . Novolog Mix [Insulin Aspart Prot & Aspart] Other (See Comments)    Causes skin to swell/itch at injection site   Family History  Problem Relation Age of Onset  . Cancer Mother        intestinal   . Heart disease Mother   . Cancer Father        prostate  . Migraines Daughter   . Leukemia Sister   . Heart disease Sister   . Prostate cancer Other   . Kidney cancer Other   . Cancer Other        Bladder cancer  . Coronary artery disease Other   . Hypertension Sister   . Hypertension Brother   . Heart attack Neg Hx   . Stroke Neg Hx     PE: BP 118/60   Pulse 77   Ht 5' 10.5" (1.791 m)   Wt 251 lb (113.9 kg)   SpO2 95%   BMI 35.51 kg/m  Body mass index is 35.51 kg/m. Wt Readings from Last 3 Encounters:  04/30/20 251 lb (113.9 kg)  03/18/20 248 lb (112.5 kg)  03/06/20 249 lb (112.9 kg)   Constitutional: overweight, in NAD Eyes: PERRLA, EOMI, no exophthalmos ENT: moist mucous membranes, no thyromegaly, no cervical  lymphadenopathy Cardiovascular: RRR, No MRG Respiratory: CTA B Gastrointestinal: abdomen soft, NT, ND, BS+ Musculoskeletal: no deformities, strength intact in all 4 Skin: moist, warm, no rashes Neurological: + tremor with outstretched hands, DTR normal in all 4  ASSESSMENT: 1. DM2, insulin-dependent, uncontrolled, with complications - CAD, s/p stents - He had stenting of distal RCA/PDA in 07/2013. Prior stenting of LAD in 04/2013. 2 new stents: PCI to left circumflex and the mid RCA on 07/30/2016. - AAA - CKD stage 3  2. HL  3.  Obesity  class II BMI Classification:  < 18.5 underweight   18.5-24.9 normal weight   25.0-29.9 overweight   30.0-34.9 class I obesity   35.0-39.9 class II obesity   ? 40.0 class III obesity   PLAN:  1. Patient with history of uncontrolled type 2 diabetes, on basal-bolus insulin regimen with NPH and regular insulin due to price.  He had a significant improvement in his blood sugars after a change to a mostly plant-based diet, even though he relaxed this slightly afterwards.  At last visit sugars are higher especially with neck pain so we increased dose of his insulin but discussed about decreasing the doses back after his neck pain and his sugars start to improve. -Since last visit, however, he increased his NPH so is not taking 50 units twice a day.  He is also using the higher doses of regular insulin of the recommended interval.  We discussed at this visit that he may need to vary the doses of regular insulin based on the size of his meal and if he has dessert after the meal.  I gave him a new regular insulin regimen.  For example, his sugars started to increase around 4 July celebration and he went out of town and had more dietary indiscretions.   -We will keep the NPH the same for now.   -He occasionally has high blood sugars before meals and he mentions that this may be due to a snack between meals.  We discussed that he may need to cover the snack  with few units of regular insulin, but ideally skip the snacks. - I advised him to: Patient Instructions   Please continue: Insulin Before breakfast Before lunch Before dinner  Regular 25-35 25-35 25-35 (if sugars <100, take 15 units)  NPH 50  50   If you take dinnertime insulin after dinner, take 50% of the dose.  Please return in 4 months with your sugar log.   - we checked his HbA1c: 6.7% (improved) - advised to check sugars at different times of the day - 3x a day, rotating check times - advised for yearly eye exams >> he is UTD - return to clinic in 4 months  2. HL -Reviewed latest lipid 11/2019: LDL at goal, triglycerides high, HDL low: Lab Results  Component Value Date   CHOL 113 11/21/2019   HDL 21.50 (L) 11/21/2019   LDLCALC 53 05/11/2019   LDLDIRECT 57.0 11/21/2019   TRIG 250.0 (H) 11/21/2019   CHOLHDL 5 11/21/2019  -Continues Lipitor, fenofibrate, flaxseed oil, without side effects  3.  Obesity class 2 -He did very well in the past on a plant-based diet and I strongly advised him to continue.  However, he is not eating meat. -Before last visit, he lost 9 pounds, but since then, he lost 13 pounds!  [name], MD PhD [facility] Endocrinology

## 2020-04-30 NOTE — Addendum Note (Signed)
Addended by: Cardell Peach I on: 04/30/2020 03:29 PM   Modules accepted: Orders

## 2020-04-30 NOTE — Patient Instructions (Signed)
Please continue: Insulin Before breakfast Before lunch Before dinner  Regular 25-35 25-35 25-35 (if sugars <100, take 15 units)  NPH 50  50   If you take dinnertime insulin after dinner, take 50% of the dose.  Please return in 4 months with your sugar log.

## 2020-05-31 ENCOUNTER — Other Ambulatory Visit: Payer: Self-pay | Admitting: Family Medicine

## 2020-06-27 ENCOUNTER — Telehealth: Payer: Self-pay | Admitting: Family Medicine

## 2020-06-27 NOTE — Progress Notes (Signed)
  Chronic Care Management   Note  06/27/2020 Name: William Hendrix MRN: [id number] DOB: 1941/09/16  [name] is a 79 y.o. year old male who is a primary care patient of [name], MD. I reached out to [name] by phone today in response to a referral sent by William Hendrix's PCP, [name], MD.   William Hendrix was given information about Chronic Care Management services today including:  1. CCM service includes personalized support from designated clinical staff supervised by his physician, including individualized plan of care and coordination with other care providers 2. 24/7 contact phone numbers for assistance for urgent and routine care needs. 3. Service will only be billed when office clinical staff spend 20 minutes or more in a month to coordinate care. 4. Only one practitioner may furnish and bill the service in a calendar month. 5. The patient may stop CCM services at any time (effective at the end of the month) by phone call to the office staff.   Patient agreed to services and verbal consent obtained.   Follow up plan:   Carley Perdue [employer] Scheduler

## 2020-08-12 ENCOUNTER — Telehealth: Payer: Self-pay | Admitting: Pharmacist

## 2020-08-12 NOTE — Progress Notes (Addendum)
Chronic Care Management Pharmacy Assistant   Name: William Hendrix  MRN: [id number] DOB: Oct 29, 1940  Reason for Encounter: Initial Questions   PCP : [name], MD  Allergies:   Allergies  Allergen Reactions  . Brilinta [Ticagrelor] Shortness Of Breath  . Insulin Detemir Swelling, Other (See Comments) and Itching    Patient had redness and swelling and tenderness at injection site. Patient had redness and swelling and tenderness at injection site.  . Bee Venom   . Codeine Other (See Comments)    Makes him "shakey", is OK with hydrocodone GI UPSET & TREMORS Makes him "shakey", is OK with hydrocodone Makes him "shakey", is OK with hydrocodone GI UPSET & TREMORS  . Lipitor [Atorvastatin] Other (See Comments)    Muscle aches; affects liver function- Patient is currently taking  . Novolog Mix [Insulin Aspart Prot & Aspart] Other (See Comments)    Causes skin to swell/itch at injection site    Medications: Outpatient Encounter Medications as of 08/12/2020  Medication Sig Note  . acetaminophen (TYLENOL) 500 MG tablet Take 500 mg by mouth 2 (two) times daily.    Marland Kitchen allopurinol (ZYLOPRIM) 300 MG tablet TAKE 1 TABLET EVERY DAY   . aspirin EC 81 MG tablet Take 81 mg by mouth daily. 03/18/2020: Last dose was 03/14/20  . atorvastatin (LIPITOR) 40 MG tablet TAKE 1 TABLET EVERY DAY   . B Complex Vitamins (B COMPLEX-B12 PO) Take 1 tablet by mouth daily.    . Blood Glucose Monitoring Suppl (TRUE METRIX AIR GLUCOSE METER) DEVI Use to check blood sugar.   . Chlorpheniramine Maleate (CHLOR-TRIMETON ALLERGY PO) Take 1 tablet by mouth every 6 (six) hours as needed (allergies).   . Cholecalciferol (VITAMIN D3) 25 MCG (1000 UT) CAPS Take 1,000 Units by mouth at bedtime.   Marland Kitchen dexamethasone (DECADRON) 1 MG tablet 2 tablets twice daily for 2 days, one tablet twice daily for 2 days, one tablet daily for 2 days.   . fenofibrate 160 MG tablet TAKE 1 TABLET (160 MG TOTAL) BY MOUTH AT BEDTIME.   .  fluocinonide cream (LIDEX) 3.24 % Apply 1 application topically daily as needed (for rashes).    . fluticasone (FLONASE) 50 MCG/ACT nasal spray Place 2 sprays into both nostrils daily. (Patient taking differently: Place 2 sprays into both nostrils daily as needed for allergies. )   . furosemide (LASIX) 40 MG tablet Take 2 tablets (80 mg total) by mouth daily.   Marland Kitchen HYDROcodone-acetaminophen (NORCO) 10-325 MG tablet Take 0.5 tablets by mouth 2 (two) times daily.   . insulin NPH Human (NOVOLIN N) 100 UNIT/ML injection Inject 0.5 mLs (50 Units total) into the skin 2 (two) times daily before a meal.   . insulin regular (NOVOLIN R RELION) 100 units/mL injection Inject 25-30 units 3 times a day before meals. (Patient taking differently: Inject 25-30 Units into the skin 3 (three) times daily before meals. Inject 25-30 units 3 times a day before meals.)   . Insulin Syringe-Needle U-100 (BD INSULIN SYRINGE U/F) 31G X 5/16" 0.3 ML MISC Use to inject insulin 5 times a day. (Patient taking differently: 1 each by Other route 5 (five) times daily. )   . isosorbide mononitrate (IMDUR) 30 MG 24 hr tablet TAKE 1 TABLET (30 MG TOTAL) BY MOUTH DAILY.   Marland Kitchen MAGNESIUM PO Take 1 tablet by mouth at bedtime.   . Melatonin 10 MG TABS Take 10 mg by mouth at bedtime.   . metoprolol tartrate (LOPRESSOR)  50 MG tablet TAKE 1 TABLET TWICE DAILY   . nitroGLYCERIN (NITROSTAT) 0.4 MG SL tablet Place 1 tablet (0.4 mg total) under the tongue every 5 (five) minutes as needed for chest pain. 05/10/2019: Has on hand  . pantoprazole (PROTONIX) 20 MG tablet Take 1 tablet (20 mg total) by mouth daily.   . Probiotic Product (PROBIOTIC PO) Take 1 capsule by mouth at bedtime.    Marland Kitchen tiZANidine (ZANAFLEX) 4 MG tablet Take 4 mg by mouth 2 (two) times daily. 03/18/2020: Last dose was 03/14/20  . TRUE METRIX BLOOD GLUCOSE TEST test strip TEST BLOOD SUGAR THREE TIMES DAILY AS DIRECTED (Patient taking differently: 1 each by Other route in the morning, at noon,  and at bedtime. )   . TRUEplus Lancets 33G MISC Use to check blood sugar 3 times a day (Patient taking differently: 1 each by Other route in the morning, at noon, and at bedtime. Use to check blood sugar 3 times a day)    No facility-administered encounter medications on file as of 08/12/2020.    Current Diagnosis: Patient Active Problem List   Diagnosis Date Noted  . Dysphagia 01/10/2020  . Post-operative complication 54/00/8676  . Lyme disease 05/04/2019  . Chronic renal failure, stage 3 (moderate) ([place]) 11/12/2018  . Chronic diastolic CHF (congestive heart failure) ([place]) 11/12/2018  . Overactive bladder 10/31/2018  . Acute diastolic CHF (congestive heart failure) ([place])   . Chest pain 09/15/2018  . UTI (urinary tract infection) 08/01/2018  . Muscle cramps 06/13/2018  . Urinary incontinence 05/01/2018  . Anemia 11/07/2017  . Constipation 11/07/2017  . Lumbar stenosis with neurogenic claudication 10/22/2017  . Back pain 08/29/2017  . Right-sided low back pain with right-sided sciatica 11/22/2016  . Dyspnea 08/01/2016  . NSTEMI (non-ST elevated myocardial infarction) ([place]) 07/30/2016  . CAD in native artery   . Obesity   . Controlled diabetes mellitus type 2 with complications ([place]) 19/50/9326  . Lumbar radiculopathy 10/09/2015  . Neuropathy, diabetic ([place]) 05/22/2015  . H/O diverticulitis of colon 01/31/2015  . AAA (abdominal aortic aneurysm) ([place]) 01/01/2015  . Sinusitis 10/10/2014  . Right hip pain 07/05/2014  . Palpitations 02/27/2014  . Claudication of lower extremity ([place]) 10/25/2013  . BPH with obstruction/lower urinary tract symptoms 10/25/2013  . Hypertriglyceridemia 08/15/2012  . Melanoma ([place])   . Dizziness   . Renal insufficiency   . Coronary artery disease with exertional angina ([place])   . Hyperlipidemia   . Essential hypertension   . OSA (obstructive sleep apnea)   . Ejection fraction   . SOB (shortness of breath)   . PARESTHESIA 05/30/2009  . EDEMA  01/22/2009  . OTHER ABNORMAL BLOOD CHEMISTRY 04/25/2008  . Gout 10/21/2007  . Depression 10/21/2007  . VENTRAL HERNIA 10/21/2007  . HIATAL HERNIA 10/21/2007  . FATIGUE 10/21/2007  . SKIN CANCER, HX OF 10/21/2007  . NEPHROLITHIASIS, HX OF 10/21/2007    Goals Addressed   None     Have you seen any other providers since your last visit? No   Any changes in your medications or health? No   Any side effects from any medications? None   Do you have an symptoms or problems not managed by your medications? Patient reports he has "trouble with his back" but this is an ongoing issue. Recently Dr. Ellene Route has decreased his hydrocodone use because he is addicted. He now takes on half a tab at night for pain.   Any concerns about your health right now? Back pain disables  him from walking. Has tremendous hip pain.   Has your provider asked that you check blood pressure, blood sugar, or follow special diet at home? Yes, patient takes his BP daily and his BS three times a day.   Do you get any type of exercise on a regular basis? Patient states he is unable to walk but mows his grass on his ride on mower.   Can you think of a goal you would like to reach for your health? None   Do you have any problems getting your medications? Patient gets his medicine with [facility] mail order unless there is something he needs urgently, and then he will fill medication at [facility].   Is there anything that you would like to discuss during the appointment? Patient would like to know if Diclofenac would helpful. Would also like to make an appointment with PCP in office.  Reminded the patient to please have medications and supplements available at his appointment on Tuesday November 2nd at 11:00am over the telephone. CPP please call number [phone]   Follow-Up:  Pharmacist Review   Fanny Skates, [place] Pharmacist Assistant [phone]  Reviewed by: De Blanch, PharmD Clinical  Pharmacist [facility] [place] [facility] [phone]

## 2020-08-13 ENCOUNTER — Other Ambulatory Visit: Payer: Self-pay

## 2020-08-13 ENCOUNTER — Ambulatory Visit: Payer: Medicare HMO | Admitting: Pharmacist

## 2020-08-13 DIAGNOSIS — E118 Type 2 diabetes mellitus with unspecified complications: Secondary | ICD-10-CM

## 2020-08-13 DIAGNOSIS — E785 Hyperlipidemia, unspecified: Secondary | ICD-10-CM

## 2020-08-13 DIAGNOSIS — Z794 Long term (current) use of insulin: Secondary | ICD-10-CM

## 2020-08-13 DIAGNOSIS — M5416 Radiculopathy, lumbar region: Secondary | ICD-10-CM

## 2020-08-13 DIAGNOSIS — I10 Essential (primary) hypertension: Secondary | ICD-10-CM

## 2020-08-13 NOTE — Chronic Care Management (AMB) (Signed)
Chronic Care Management Pharmacy  Name: William Hendrix  MRN: [id number] DOB: 07-25-1941  Chief Complaint/ HPI  William Hendrix,  79 y.o. , male presents for their Initial CCM visit with the clinical pharmacist via telephone due to COVID-19 Pandemic.  PCP : [name], MD  Their chronic conditions include: Hypertension, Hyperlipidemia/CAD, Diabetes, GERD, Depression, Insomnia, BPH, Gout, Lumbar Radiculopathy  Office Visits: 11/24/19: Visit w/ Dr. Charlett Blake - No med changes noted.   Consult Visit: 04/12/20: Endo visit w/ Dr. Renne Crigler - Regular insulin 25-35 units before meals (for dinner use 15 units if BG <100), NPH 50 units before breakfast and 50 units before dinner.   03/21/20: Urology visit w/ Dr. Lovena Neighbours  03/06/20: Cardio visit w/ Dr. Stanford Breed - No med changes noted.  03/01/20: Neuro visit w/ Dr. Ellene Route  Medications: Outpatient Encounter Medications as of 08/13/2020  Medication Sig Note  . acetaminophen (TYLENOL) 500 MG tablet Take 500 mg by mouth 2 (two) times daily.    Marland Kitchen alfuzosin (UROXATRAL) 10 MG 24 hr tablet Take 10 mg by mouth daily with breakfast.   . allopurinol (ZYLOPRIM) 300 MG tablet TAKE 1 TABLET EVERY Mackay Hanauer   . aspirin EC 81 MG tablet Take 81 mg by mouth daily. 03/18/2020: Last dose was 03/14/20  . atorvastatin (LIPITOR) 40 MG tablet TAKE 1 TABLET EVERY Nolah Krenzer   . B Complex Vitamins (B COMPLEX-B12 PO) Take 1 tablet by mouth daily.    . Chlorpheniramine Maleate (CHLOR-TRIMETON ALLERGY PO) Take 1 tablet by mouth every 6 (six) hours as needed (allergies).   . Cholecalciferol (VITAMIN D3) 50 MCG (2000 UT) capsule Take 2,000 Units by mouth at bedtime.    . fenofibrate 160 MG tablet TAKE 1 TABLET (160 MG TOTAL) BY MOUTH AT BEDTIME.   . fluocinonide cream (LIDEX) 5.37 % Apply 1 application topically daily as needed (for rashes).    . fluticasone (FLONASE) 50 MCG/ACT nasal spray Place 2 sprays into both nostrils daily. (Patient taking differently: Place 2 sprays into both nostrils  daily as needed for allergies. ) 08/13/2020: Uses once every 2-3 months; symptoms from CPAP  . furosemide (LASIX) 40 MG tablet Take 2 tablets (80 mg total) by mouth daily.   . insulin NPH Human (NOVOLIN N) 100 UNIT/ML injection Inject 0.5 mLs (50 Units total) into the skin 2 (two) times daily before a meal.   . insulin regular (NOVOLIN R RELION) 100 units/mL injection Inject 25-30 units 3 times a Hilbert Briggs before meals. (Patient taking differently: Inject 25-30 Units into the skin 3 (three) times daily before meals. Inject 25-30 units 3 times a Elan Mcelvain before meals.)   . isosorbide mononitrate (IMDUR) 30 MG 24 hr tablet TAKE 1 TABLET (30 MG TOTAL) BY MOUTH DAILY.   Marland Kitchen MAGNESIUM PO Take 1 tablet by mouth at bedtime.   . Melatonin 10 MG TABS Take 10 mg by mouth at bedtime.   . metoprolol tartrate (LOPRESSOR) 50 MG tablet TAKE 1 TABLET TWICE DAILY 08/13/2020: 1/2 tab twice daily  . pantoprazole (PROTONIX) 20 MG tablet Take 1 tablet (20 mg total) by mouth daily.   . Probiotic Product (PROBIOTIC PO) Take 1 capsule by mouth at bedtime.    Marland Kitchen tiZANidine (ZANAFLEX) 4 MG tablet Take 4 mg by mouth 2 (two) times daily. 03/18/2020: Last dose was 03/14/20  . Blood Glucose Monitoring Suppl (TRUE METRIX AIR GLUCOSE METER) DEVI Use to check blood sugar.   Marland Kitchen dexamethasone (DECADRON) 1 MG tablet 2 tablets twice daily for 2 days,  one tablet twice daily for 2 days, one tablet daily for 2 days.   Marland Kitchen HYDROcodone-acetaminophen (NORCO) 10-325 MG tablet Take 0.5 tablets by mouth 2 (two) times daily. 08/13/2020: Out of refills and was informed he can't get refills  . Insulin Syringe-Needle U-100 (BD INSULIN SYRINGE U/F) 31G X 5/16" 0.3 ML MISC Use to inject insulin 5 times a Iona Stay. (Patient taking differently: 1 each by Other route 5 (five) times daily. )   . nitroGLYCERIN (NITROSTAT) 0.4 MG SL tablet Place 1 tablet (0.4 mg total) under the tongue every 5 (five) minutes as needed for chest pain. 05/10/2019: Has on hand  . TRUE METRIX BLOOD GLUCOSE  TEST test strip TEST BLOOD SUGAR THREE TIMES DAILY AS DIRECTED (Patient taking differently: 1 each by Other route in the morning, at noon, and at bedtime. )   . TRUEplus Lancets 33G MISC Use to check blood sugar 3 times a Arriyana Rodell (Patient taking differently: 1 each by Other route in the morning, at noon, and at bedtime. Use to check blood sugar 3 times a Aleesa Sweigert)    No facility-administered encounter medications on file as of 08/13/2020.     Current Diagnosis/Assessment:  Goals Addressed            This Visit's Progress   . Chronic Care Management Pharmacy Care Plan       CARE PLAN ENTRY (see longitudinal plan of care for additional care plan information)  Current Barriers:  . Chronic Disease Management support, education, and care coordination needs related to Hypertension, Hyperlipidemia/CAD, Diabetes, GERD, Depression, Insomnia, BPH, Gout, Lumbar Radiculopathy   Hypertension BP Readings from Last 3 Encounters:  04/30/20 118/60  03/18/20 140/70  03/06/20 (!) 122/56   . Pharmacist Clinical Goal(s): o Over the next 90 days, patient will work with PharmD and providers to maintain BP goal <130/80 . Current regimen:  o Metoprolol Tartrate 38m 1 tab twice daily (taking 1/2 tab twice daily) . Interventions: o Discussed BP goal o Collaboration with provider regarding medication management (updated script for metoprolol) . Patient self care activities - Over the next 90 days, patient will: o Check BP 3-5 times per week, document, and provide at future appointments o Ensure daily salt intake < 2300 mg/Reylynn Vanalstine  Hyperlipidemia Lab Results  Component Value Date/Time   LDLCALC 53 05/11/2019 08:43 AM   LDLDIRECT 57.0 11/21/2019 08:24 AM   . Pharmacist Clinical Goal(s): o Over the next 90 days, patient will work with PharmD and providers to maintain LDL goal < 70, and achieve Triglyceride goal < 150 . Current regimen:  . Atorvastatin 472mdaily . Fenofibrate 16042maily . Aspirin 21m32maily . Isosorbide Mono 30mg30mly . Interventions: o Discussed LDL and triglyceride goal . Patient self care activities - Over the next 90 days, patient will: o Maintain cholesterol medication regimen.   Diabetes Lab Results  Component Value Date/Time   HGBA1C 6.7 (A) 04/30/2020 03:29 PM   HGBA1C 8.0 (H) 11/21/2019 08:24 AM   HGBA1C 7.2 (H) 08/14/2019 08:12 AM   . Pharmacist Clinical Goal(s): o Over the next 90 days, patient will work with PharmD and providers to maintain A1c goal <7% . Current regimen:  . Novolin N 50 units AM, 50 units HS . Novolin R 30 units AM, 30 units Lunch, 30 units HS . Interventions: o Discussed DM goals . Patient self care activities - Over the next 90 days, patient will: o Check blood sugar 3-4 times daily, document, and provide at future appointments o  Contact provider with any episodes of hypoglycemia  Lumbar Radiculopathy . Pharmacist Clinical Goal(s) o Over the next 90 days, patient will work with PharmD and providers to reduce symptoms associated with lumbar radiculopathy . Current regimen:  o Hydrocodone/APAP 10/361m 1/2 tab twice daily . Interventions: o Collaboration with provider regarding medication management (pt to schedule visit with Dr. BCharlett Blake . Patient self care activities - Over the next 90 days, patient will: o Follow up with Dr. BCharlett Blaketo discuss pain medication options  Medication management . Pharmacist Clinical Goal(s): o Over the next 90 days, patient will work with PharmD and providers to maintain optimal medication adherence . Current pharmacy[facility]. Interventions o Comprehensive medication review performed. o Continue current medication management strategy . Patient self care activities - Over the next 90 days, patient will: o Focus on medication adherence by filling and taking medications appropriately  o Take medications as prescribed o Report any questions or concerns to PharmD and/or  provider(s)  Initial goal documentation       Social Hx:  Wife on call Previously could paint, climb ladders, etc.  Upset he can't walk and function as much as before. "ice picks poking in my hips" [place] sister is 8[place]Sister [phone] sister is 731Had 13 siblings but 5 remaining  Hypertension   BP goal is:  <130/80  Office blood pressures are  BP Readings from Last 3 Encounters:  04/30/20 118/60  03/18/20 140/70  03/06/20 (!) 122/56   Patient checks BP at home 3-5x per week Patient home BP readings are ranging:  '140 64 60 137 65 57 137 70 63 134 ' '68 60 129 71 59 140 69 54 132 67 ' 63 Avg 135.5 67.7 59.4   Patient has failed these meds in the past: None noted  Patient is currently controlled on the following medications:  .Marland KitchenMetoprolol Tartrate 561m1/2 tab twice daily  Needs updated script  We discussed BP goal  Plan -Continue current medications     Hyperlipidemia/CAD   LDL goal < 70  TRIG goal < 150   Last lipids Lab Results  Component Value Date   CHOL 113 11/21/2019   HDL 21.50 (L) 11/21/2019   LDLCALC 53 05/11/2019   LDLDIRECT 57.0 11/21/2019   TRIG 250.0 (H) 11/21/2019   CHOLHDL 5 11/21/2019   Hepatic Function Latest Ref Rng & Units 01/09/2020 11/21/2019 08/14/2019  Total Protein 6.5 - 8.1 g/dL 6.5 7.0 6.7  Albumin 3.5 - 5.0 g/dL 3.8 4.2 4.2  AST 15 - 41 U/L '23 22 19  ' ALT 0 - 44 U/L '19 23 18  ' Alk Phosphatase 38 - 126 U/L 51 61 58  Total Bilirubin 0.3 - 1.2 mg/dL 0.5 0.6 0.4  Bilirubin, Direct 0.0 - 0.3 mg/dL - - -     The ASCVD Risk score (GoOmro et al., 2013) failed to calculate for the following reasons:   The 2013 ASCVD risk score is only valid for ages 4067o 7950 The patient has a prior MI or stroke diagnosis   Patient has failed these meds in past: None noted  Patient is currently controlled, except for TRIG on the following medications:  . Atorvastatin 4074maily  AM . Fenofibrate 160m88mily HS . Aspirin 81mg86mly AM . Isosorbide Mono 30mg 55my AM  Diet Broiled fish he catches himself Steak twice a week and he isn't giving it up  We discussed:  LDL and TRIG goal  Plan -Continue current medications  Diabetes   Followed by Endo (Dr. Cruzita Lederer)  A1c goal <7%  FBG goal 80-130  Recent Relevant Labs: Lab Results  Component Value Date/Time   HGBA1C 6.7 (A) 04/30/2020 03:29 PM   HGBA1C 8.0 (H) 11/21/2019 08:24 AM   HGBA1C 7.2 (H) 08/14/2019 08:12 AM   GFR 43.72 (L) 11/21/2019 08:24 AM   GFR 48.83 (L) 08/14/2019 08:12 AM   MICROALBUR 0.7 08/02/2014 10:49 AM   MICROALBUR 0.5 02/23/2013 02:32 PM    Last diabetic Eye exam:  Lab Results  Component Value Date/Time   HMDIABEYEEXA No Retinopathy 09/30/2015 12:00 AM    Last diabetic Foot exam: No results found for: HMDIABFOOTEX   Checking BG: 3x per Hyland Mollenkopf  Patient has failed these meds in past: metformin (CKD), Januvia (cost), Levemir (skin reaction) Patient is currently controlled on the following medications: . Novolin N 50 units AM, 50 units HS . Novolin R 30 units AM, 30 units Lunch, 30 units HS  We discussed: DM goals  Plan -Continue current medications   GERD   Patient has failed these meds in past: None noted  Patient is currently controlled on the following medications:  . Pantoprazole 96m daily   Plan -Continue current medications   Depression   [facility] 08/13/2020 06/07/2019 08/29/2018  Decreased Interest 0 (No Data) 0  Down, Depressed, Hopeless 0 (No Data) 0  PHQ - 2 Score 0 - 0  Some recent data might be hidden    Patient has failed these meds in past: None noted  Patient is currently controlled on the following medications:  . None   Plan -Continue current management   Insomnia     Patient has failed these meds in past: None noted  Patient is currently controlled on the following medications: .Marland KitchenMelatonin 126m daily   Plan -Continue current medications   BPH   Followed by Urology (CHarrell Gaveinter)  PSA  Date Value Ref Range Status  11/02/2018 1.01 0.10 - 4.00 ng/mL Final    Comment:    Test performed using Access Hybritech PSA Assay, a parmagnetic partical, chemiluminecent immunoassay.     Patient has failed these meds in past: tamsulosin (change in therapy?) Patient is currently controlled on the following medications:  . Alfuzosin 1040maily AM    Plan -Continue current medications   Gout   Uric Acid, Serum  Date Value Ref Range Status  12/15/2018 4.3 4.0 - 7.8 mg/dL Final  05/13/2017 4.7 4.0 - 7.8 mg/dL Final  08/02/2014 4.0 4.0 - 7.8 mg/dL Final     Goal Uric Acid < 6 mg/dL   Medications that may increase uric acid levels: Loop Diuretics  and Aspirin  Last gout flare: Can't recall last flare  Patient has failed these meds in past: None noted  Patient is currently controlled on the following medications:  . Allopurinol 300m67mily  Plan -Continue current medications   Lumbar Radiculopathy   Patient has failed these meds in past: None noted  Patient is currently uncontrolled on the following medications: . Hydrocodone/APAP 10/325mg11m tab twice daily (no longer able to get refill from neurosurgeon)  Feels good other than his back Surgeon: Dr. ElsneEllene Route told he can't get more pain med due to being addicted) Was told there is nothing further that can be done for him surgically Can't even walk to mail box.  Last back surgery May 2021 Takes 1/2 tab at  night to sleep  Would not like to see more doctors. Would not like to see pain management No hx of seizures  Consider tramadol?  We discussed:  Med options for pain  Plan -Follow up with Dr. Charlett Blake to discuss pain med options   Vaccines   Reviewed and discussed patient's vaccination history.    Immunization History  Administered Date(s) Administered  . Fluad Quad(high Dose 65+) 06/15/2019  .  Influenza Split 08/08/2012  . Influenza Whole 08/08/2009, 07/12/2010  . Influenza, High Dose Seasonal PF 08/02/2014, 07/11/2015, 08/26/2017, 08/01/2018  . Influenza,inj,Quad PF,6+ Mos 08/05/2011, 07/27/2013, 08/04/2016  . Pneumococcal Conjugate-13 08/02/2014  . Pneumococcal Polysaccharide-23 07/28/2005   Received covid vaccines 11/23/19 and 12/18/19  Received flu shot 08/11/20  Plan -Update vaccine record -Recommended patient receive Shingles vaccine in pharmacy. - Pt declines -Had allergic reaction to pneumonia vaccine  Medication Management   [facility] medications  Miscellaneous Meds Acetaminophen   We discussed: Current pharmacy is preferred with insurance plan and patient is satisfied with pharmacy services  Plan  Continue current medication management strategy   Follow up: 3 month phone visit  De Blanch, PharmD Clinical Pharmacist [facility] [phone]

## 2020-08-19 ENCOUNTER — Other Ambulatory Visit: Payer: Self-pay

## 2020-08-19 DIAGNOSIS — E785 Hyperlipidemia, unspecified: Secondary | ICD-10-CM

## 2020-08-19 DIAGNOSIS — I10 Essential (primary) hypertension: Secondary | ICD-10-CM

## 2020-08-20 ENCOUNTER — Other Ambulatory Visit: Payer: Self-pay | Admitting: Family Medicine

## 2020-08-20 MED ORDER — METOPROLOL TARTRATE 25 MG PO TABS: 25.0000 mg | ORAL_TABLET | Freq: Two times a day (BID) | ORAL | 1 refills | Status: DC

## 2020-08-20 NOTE — Patient Instructions (Addendum)
Visit Information  Goals Addressed            This Visit's Progress   . Chronic Care Management Pharmacy Care Plan       CARE PLAN ENTRY (see longitudinal plan of care for additional care plan information)  Current Barriers:  . Chronic Disease Management support, education, and care coordination needs related to Hypertension, Hyperlipidemia/CAD, Diabetes, GERD, Depression, Insomnia, BPH, Gout, Lumbar Radiculopathy   Hypertension BP Readings from Last 3 Encounters:  04/30/20 118/60  03/18/20 140/70  03/06/20 (!) 122/56   . Pharmacist Clinical Goal(s): o Over the next 90 days, patient will work with PharmD and providers to maintain BP goal <130/80 . Current regimen:  o Metoprolol Tartrate 50mg  1 tab twice daily (taking 1/2 tab twice daily) . Interventions: o Discussed BP goal o Collaboration with provider regarding medication management (updated script for metoprolol) . Patient self care activities - Over the next 90 days, patient will: o Check BP 3-5 times per week, document, and provide at future appointments o Ensure daily salt intake < 2300 mg/William Hendrix  Hyperlipidemia Lab Results  Component Value Date/Time   LDLCALC 53 05/11/2019 08:43 AM   LDLDIRECT 57.0 11/21/2019 08:24 AM   . Pharmacist Clinical Goal(s): o Over the next 90 days, patient will work with PharmD and providers to maintain LDL goal < 70, and achieve Triglyceride goal < 150 . Current regimen:  . Atorvastatin 40mg  daily . Fenofibrate 160mg  daily . Aspirin 81mg  daily . Isosorbide Mono 30mg  daily . Interventions: o Discussed LDL and triglyceride goal . Patient self care activities - Over the next 90 days, patient will: o Maintain cholesterol medication regimen.   Diabetes Lab Results  Component Value Date/Time   HGBA1C 6.7 (A) 04/30/2020 03:29 PM   HGBA1C 8.0 (H) 11/21/2019 08:24 AM   HGBA1C 7.2 (H) 08/14/2019 08:12 AM   . Pharmacist Clinical Goal(s): o Over the next 90 days, patient will work with  PharmD and providers to maintain A1c goal <7% . Current regimen:  . Novolin N 50 units AM, 50 units HS . Novolin R 30 units AM, 30 units Lunch, 30 units HS . Interventions: o Discussed DM goals . Patient self care activities - Over the next 90 days, patient will: o Check blood sugar 3-4 times daily, document, and provide at future appointments o Contact provider with any episodes of hypoglycemia  Lumbar Radiculopathy . Pharmacist Clinical Goal(s) o Over the next 90 days, patient will work with PharmD and providers to reduce symptoms associated with lumbar radiculopathy . Current regimen:  o Hydrocodone/APAP 10/325mg  1/2 tab twice daily . Interventions: o Collaboration with provider regarding medication management (pt to schedule visit with Dr. Charlett Blake) . Patient self care activities - Over the next 90 days, patient will: o Follow up with Dr. Charlett Blake to discuss pain medication options  Medication management . Pharmacist Clinical Goal(s): o Over the next 90 days, patient will work with PharmD and providers to maintain optimal medication adherence . Current pharmacy: [facility] . Interventions o Comprehensive medication review performed. o Continue current medication management strategy . Patient self care activities - Over the next 90 days, patient will: o Focus on medication adherence by filling and taking medications appropriately  o Take medications as prescribed o Report any questions or concerns to PharmD and/or provider(s)  Initial goal documentation        William Hendrix was given information about Chronic Care Management services today including:  1. CCM service includes personalized support from  designated clinical staff supervised by his physician, including individualized plan of care and coordination with other care providers 2. 24/7 contact phone numbers for assistance for urgent and routine care needs. 3. Standard insurance, coinsurance, copays and deductibles  apply for chronic care management only during months in which we provide at least 20 minutes of these services. Most insurances cover these services at 100%, however patients may be responsible for any copay, coinsurance and/or deductible if applicable. This service may help you avoid the need for more expensive face-to-face services. 4. Only one practitioner may furnish and bill the service in a calendar month. 5. The patient may stop CCM services at any time (effective at the end of the month) by phone call to the office staff.  Patient agreed to services and verbal consent obtained.   The patient verbalized understanding of instructions provided today and agreed to receive a mailed copy of patient instruction and/or educational materials. Telephone follow up appointment with pharmacy team member scheduled for: 11/13/2020  De Blanch, PharmD Clinical Pharmacist [facility] [phone]

## 2020-08-21 NOTE — Progress Notes (Signed)
Addressed with pt he is okay with this appt scheduled for 09/24/20 at 320 pm

## 2020-08-21 NOTE — Progress Notes (Signed)
Pt contacted. Stated he was prescribed originally prescribed 10 mg Hydrocodone-acetaminophen.. Currently on a taper where he is ordered 5 mg but he uses 2.5 mg from cutting tabs in half. Is okay with the taper just needs a pain med to help him at night while asleep. Pt is okay to trying other medications for relief besides controls. Pt appointment scheduled for 09/24/20 at 320 pm. Soonest available with Dr Charlett Blake

## 2020-08-23 NOTE — Progress Notes (Signed)
HPI: FU CAD. Pt has had previous PCI of his LAD. Patient had admission October 2017 with NSTEMI. He had PCI of the circumflex and RCA with drug-eluting stents. Cardiac catheterization repeated May 2018because of dyspnea and chest pain. There was continued patency of stent segments in his RCA, left circumflex and LAD. Left ventricular end-diastolic pressure moderately elevated(23). Admitted with CHF and chest pain December 2019. Enzymes negative. Echocardiogram December 2019 showed normal LV function and mild left atrial enlargement. Nuclear study December 2019 showed no ischemia or infarction.Abdominal CT July 2020 showed 3.2 cm abdominal aortic aneurysm. ABIs 3/21 normal. Since last seen,there is no chest pain.  He has dyspnea with more vigorous activities.  No orthopnea, PND, syncope.  Minimal pedal edema.  Current Outpatient Medications  Medication Sig Dispense Refill   acetaminophen (TYLENOL) 500 MG tablet Take 500 mg by mouth 2 (two) times daily.      alfuzosin (UROXATRAL) 10 MG 24 hr tablet Take 10 mg by mouth daily with breakfast.     allopurinol (ZYLOPRIM) 300 MG tablet TAKE 1 TABLET EVERY DAY 90 tablet 1   aspirin EC 81 MG tablet Take 81 mg by mouth daily.     atorvastatin (LIPITOR) 40 MG tablet TAKE 1 TABLET EVERY DAY 90 tablet 1   B Complex Vitamins (B COMPLEX-B12 PO) Take 1 tablet by mouth daily.      Blood Glucose Monitoring Suppl (TRUE METRIX AIR GLUCOSE METER) DEVI Use to check blood sugar. 1 each 0   Chlorpheniramine Maleate (CHLOR-TRIMETON ALLERGY PO) Take 1 tablet by mouth every 6 (six) hours as needed (allergies).     Cholecalciferol (VITAMIN D3) 50 MCG (2000 UT) capsule Take 2,000 Units by mouth at bedtime.      fenofibrate 160 MG tablet TAKE 1 TABLET (160 MG TOTAL) BY MOUTH AT BEDTIME. 90 tablet 1   fluocinonide cream (LIDEX) 7.90 % Apply 1 application topically daily as needed (for rashes).      fluticasone (FLONASE) 50 MCG/ACT nasal spray Place 2  sprays into both nostrils daily. (Patient taking differently: Place 2 sprays into both nostrils daily as needed for allergies. ) 16 g 1   furosemide (LASIX) 40 MG tablet Take 2 tablets (80 mg total) by mouth daily. 180 tablet 3   HYDROcodone-acetaminophen (NORCO) 10-325 MG tablet Take 0.5 tablets by mouth 2 (two) times daily. 60 tablet 0   insulin NPH Human (NOVOLIN N) 100 UNIT/ML injection Inject 0.5 mLs (50 Units total) into the skin 2 (two) times daily before a meal. 10 mL 11   insulin regular (NOVOLIN R RELION) 100 units/mL injection Inject 25-30 units 3 times a day before meals. 60 mL 1   Insulin Syringe-Needle U-100 (BD INSULIN SYRINGE U/F) 31G X 5/16" 0.3 ML MISC Use to inject insulin 5 times a day. 500 each 11   isosorbide mononitrate (IMDUR) 30 MG 24 hr tablet TAKE 1 TABLET (30 MG TOTAL) BY MOUTH DAILY. 90 tablet 3   MAGNESIUM PO Take 1 tablet by mouth at bedtime.     Melatonin 10 MG TABS Take 10 mg by mouth at bedtime.     metoprolol tartrate (LOPRESSOR) 25 MG tablet Take 1 tablet (25 mg total) by mouth 2 (two) times daily. (Patient taking differently: Take 12.5 mg by mouth 2 (two) times daily. ) 180 tablet 1   nitroGLYCERIN (NITROSTAT) 0.4 MG SL tablet Place 1 tablet (0.4 mg total) under the tongue every 5 (five) minutes as needed for chest pain. 100  tablet 3   pantoprazole (PROTONIX) 20 MG tablet Take 1 tablet (20 mg total) by mouth daily. 90 tablet 3   Probiotic Product (PROBIOTIC PO) Take 1 capsule by mouth at bedtime.      tiZANidine (ZANAFLEX) 4 MG tablet Take 4 mg by mouth 2 (two) times daily.  6   TRUE METRIX BLOOD GLUCOSE TEST test strip TEST BLOOD SUGAR THREE TIMES DAILY AS DIRECTED (Patient taking differently: 1 each by Other route in the morning, at noon, and at bedtime. ) 300 strip 11   TRUEplus Lancets 33G MISC Use to check blood sugar 3 times a day (Patient taking differently: 1 each by Other route in the morning, at noon, and at bedtime. Use to check blood  sugar 3 times a day) 300 each 11   No current facility-administered medications for this visit.     Past Medical History:  Diagnosis Date   AAA (abdominal aortic aneurysm) ([place]) 12/15/2014   a. Mild aneurysmal dilatation of the infrarenal abdominal aorta 3.2 cm - f/u due by 2019.   Arthritis    "all over" (07/30/2016)   CAD (coronary artery disease)    a. stent to LAD 2000. b. possible spasm by cath 2001. c. IVUS/PTCA/DES to mLAD 05/2013. d. PTCA/DES of dRCA into ostial rPDA 07/2013. e. PTCA of OM2, DES to Cx, DES to [facility] 07/2016.   Chronic bronchitis (HCC)    Chronic diastolic CHF (congestive heart failure) (HCC)    Chronic lower back pain    Chronic neck pain    CKD (chronic kidney disease), stage III (HCC)    Diabetic peripheral neuropathy ([place])    Ejection fraction    55%, 07/2010, mild inferior hypo   GERD (gastroesophageal reflux disease)    Gout    H/O diverticulitis of colon 01/31/2015   H/O hiatal hernia    History of blood transfusion ~ 10/1940   "had pneumonia"   History of kidney stones    HTN (hypertension)    Hyperlipidemia    Melanoma of forearm, left ([place]) 02/2011   with wide excision    Myocardial infarction ([place])    Nephrolithiasis    Obesity    OSA on CPAP     [name] since 2000   Peripheral neuropathy    both feet   Positive D-dimer    a. significant elevation ,hospital 07/2010, etiology unclear. b. D Dimer chronically > 20.   Renal insufficiency    Skin cancer    Spinal stenosis    a. s/p surgical repair 2013   Spinal stenosis of lumbar region    Type II diabetes mellitus ([place])    Ventral hernia     Past Surgical History:  Procedure Laterality Date   ANTERIOR LAT LUMBAR FUSION  10/22/2017   Procedure: Lumbar two-three Lumbar three-four Lumbar four-five Anteriolateral lumbar interbody fusion with percutaneous pedicle screw fixation and infuse;  Surgeon: [name], MD;  Location: [place];  Service: Neurosurgery;;    APPLICATION OF ROBOTIC ASSISTANCE FOR SPINAL PROCEDURE  10/22/2017   Procedure: APPLICATION OF ROBOTIC ASSISTANCE FOR SPINAL PROCEDURE;  Surgeon: [name], MD;  Location: [place];  Service: Neurosurgery;;   BACK SURGERY     CARDIAC CATHETERIZATION N/A 07/30/2016   Procedure: Left Heart Cath and Coronary Angiography;  Surgeon: Nelva Bush, MD;  Location: [place] CV LAB;  Service: Cardiovascular;  Laterality: N/A;   CARDIAC CATHETERIZATION N/A 07/30/2016   Procedure: Coronary Stent Intervention;  Surgeon: Nelva Bush, MD;  Location: [place]  CV LAB;  Service: Cardiovascular;  Laterality: N/A;  Mid CFX and MID RCA   CARDIAC CATHETERIZATION N/A 07/30/2016   Procedure: Coronary Balloon Angioplasty;  Surgeon: Nelva Bush, MD;  Location: [place] CV LAB;  Service: Cardiovascular;  Laterality: N/A;  OM 1   CARPAL TUNNEL RELEASE Left    CATARACT EXTRACTION W/ INTRAOCULAR LENS  IMPLANT, BILATERAL Bilateral ~ 2014   CERVICAL DISC SURGERY  1990s   CORONARY ANGIOPLASTY WITH STENT PLACEMENT  05/2013   CORONARY ANGIOPLASTY WITH STENT PLACEMENT  07/26/2013   DES to RCA extending to PDA     CORONARY ANGIOPLASTY WITH STENT PLACEMENT  07/30/2016   CORONARY ANGIOPLASTY WITH STENT PLACEMENT  2000   CAD   DENTAL SURGERY  04/2016   "got infected; had to dig it out"   EYE SURGERY     KNEE ARTHROSCOPY Right 1990's   rt   LEFT AND RIGHT HEART CATHETERIZATION WITH CORONARY ANGIOGRAM N/A 07/26/2013   Procedure: LEFT AND RIGHT HEART CATHETERIZATION WITH CORONARY ANGIOGRAM;  Surgeon: [name], MD;  Location: [place] CATH LAB;  Service: Cardiovascular;  Laterality: N/A;   LEFT HEART CATH AND CORONARY ANGIOGRAPHY N/A 02/26/2017   Procedure: Left Heart Cath and Coronary Angiography;  Surgeon: Sherren Mocha, MD;  Location: [place] CV LAB;  Service: Cardiovascular;  Laterality: N/A;   LEFT HEART CATHETERIZATION WITH CORONARY ANGIOGRAM N/A 05/19/2013   Procedure: LEFT HEART  CATHETERIZATION WITH CORONARY ANGIOGRAM;  Surgeon: [name], MD;  Location: [facility] CATH LAB;  Service: Cardiovascular;  Laterality: N/A;   LUMBAR LAMINECTOMY/DECOMPRESSION MICRODISCECTOMY  08/30/2012   Procedure: LUMBAR LAMINECTOMY/DECOMPRESSION MICRODISCECTOMY 2 LEVELS;  Surgeon: [name], MD;  Location: [place] NEURO ORS;  Service: Neurosurgery;  Laterality: Bilateral;  Bilateral Lumbar three-four Lumbar four-five Laminotomies   LUMBAR PERCUTANEOUS PEDICLE SCREW 1 LEVEL Bilateral 10/22/2017   Procedure: LUMBAR PERCUTANEOUS PEDICLE SCREW 1 LEVEL;  Surgeon: [name], MD;  Location: [place];  Service: Neurosurgery;  Laterality: Bilateral;   LUMBAR SPINE SURGERY  04/07/2016   Dr. Ellene Route; "?ruptured disc"   MELANOMA EXCISION Left 02/2011   forearm   NASAL SINUS SURGERY  1970s   "cut windows in sinus pockets"   PERCUTANEOUS CORONARY STENT INTERVENTION (PCI-S)  05/19/2013   Procedure: PERCUTANEOUS CORONARY STENT INTERVENTION (PCI-S);  Surgeon: [name], MD;  Location: [facility] CATH LAB;  Service: Cardiovascular;;   ULNAR TUNNEL RELEASE Left 04/07/2016   Dr. Ellene Route    Social History   Socioeconomic History   Marital status: Married    Spouse name: Not on file   Number of children: Not on file   Years of education: Not on file   Highest education level: Not on file  Occupational History   Occupation: Curator  Tobacco Use   Smoking status: Former Smoker    Packs/day: 3.00    Years: 30.00    Pack years: 90.00    Types: Cigarettes    Quit date: 09/17/1996    Years since quitting: 23.9   Smokeless tobacco: Never Used  Vaping Use   Vaping Use: Never used  Substance and Sexual Activity   Alcohol use: No   Drug use: No   Sexual activity: Not on file  Other Topics Concern   Not on file  Social History Narrative   Married and lives locally with his wife.  Sharyon Cable.   Social Determinants of Health   Financial Resource Strain:    Difficulty of Paying Living  Expenses: Not on file  Food Insecurity:  Worried About Charity fundraiser in the Last Year: Not on file   [employer] of Food in the Last Year: Not on file  Transportation Needs:    Lack of Transportation (Medical): Not on file   Lack of Transportation (Non-Medical): Not on file  Physical Activity:    Days of Exercise per Week: Not on file   Minutes of Exercise per Session: Not on file  Stress:    Feeling of Stress : Not on file  Social Connections:    Frequency of Communication with Friends and Family: Not on file   Frequency of Social Gatherings with Friends and Family: Not on file   Attends Religious Services: Not on file   Active Member of Clubs or Organizations: Not on file   Attends Archivist Meetings: Not on file   Marital Status: Not on file  Intimate Partner Violence:    Fear of Current or Ex-Partner: Not on file   Emotionally Abused: Not on file   Physically Abused: Not on file   Sexually Abused: Not on file    Family History  Problem Relation Age of Onset   Cancer Mother        intestinal    Heart disease Mother    Cancer Father        prostate   Migraines Daughter    Leukemia Sister    Heart disease Sister    Prostate cancer Other    Kidney cancer Other    Cancer Other        Bladder cancer   Coronary artery disease Other    Hypertension Sister    Hypertension Brother    Heart attack Neg Hx    Stroke Neg Hx     ROS: Back pain but no fevers or chills, productive cough, hemoptysis, dysphasia, odynophagia, melena, hematochezia, dysuria, hematuria, rash, seizure activity, orthopnea, PND, pedal edema, claudication. Remaining systems are negative.  Physical Exam: Well-developed well-nourished in no acute distress.  Skin is warm and dry.  HEENT is normal.  Neck is supple.  Chest is clear to auscultation with normal expansion.  Cardiovascular exam is regular rate and rhythm.  Abdominal exam nontender or distended. No  masses palpated. Extremities show trace edema. neuro grossly intact  ECG-Normal sinus rhythm at a rate of 70, left anterior fascicular block.  personally reviewed  A/P  1 coronary artery disease-patient denies chest pain. Plan to continue medical therapy with aspirin and statin.  2 hypertension-blood pressure controlled. Continue present medical regimen.  3 hyperlipidemia-continue statin.  Check lipids and liver.    4 abdominal aortic aneurysm-we will arrange follow-up ultrasound.  5 chronic diastolic congestive heart failure-patient appears to be euvolemic today. Continue diuretic at present dose. Check potassium and renal function.  Needs fluid restriction and low-sodium diet.  Kirk Ruths, MD

## 2020-08-26 ENCOUNTER — Other Ambulatory Visit: Payer: Self-pay | Admitting: Cardiology

## 2020-08-26 DIAGNOSIS — R072 Precordial pain: Secondary | ICD-10-CM

## 2020-08-27 ENCOUNTER — Telehealth: Payer: Self-pay | Admitting: Family Medicine

## 2020-08-27 NOTE — Telephone Encounter (Signed)
metoprolol tartrate (LOPRESSOR) 25 MG tablet 180 tablet 1 08/20/2020   Take 1 tablet (25 mg total) by mouth 2 (two) times daily. - Oral

## 2020-08-27 NOTE — Telephone Encounter (Signed)
Pt spouse is call in reference to blood presusre medication sent to pharmacy and it's  Dosage. Per spouse medication directions are wrong

## 2020-08-27 NOTE — Telephone Encounter (Signed)
Called phone back no answer pt is taking 25mg  of BP med no changes were made

## 2020-08-28 ENCOUNTER — Encounter: Payer: Self-pay | Admitting: Cardiology

## 2020-08-28 ENCOUNTER — Other Ambulatory Visit: Payer: Self-pay

## 2020-08-28 ENCOUNTER — Ambulatory Visit: Payer: Medicare HMO | Admitting: Cardiology

## 2020-08-28 VITALS — BP 132/62 | HR 70 | Ht 70.5 in | Wt 246.7 lb

## 2020-08-28 DIAGNOSIS — I714 Abdominal aortic aneurysm, without rupture, unspecified: Secondary | ICD-10-CM

## 2020-08-28 DIAGNOSIS — I2510 Atherosclerotic heart disease of native coronary artery without angina pectoris: Secondary | ICD-10-CM

## 2020-08-28 DIAGNOSIS — E7800 Pure hypercholesterolemia, unspecified: Secondary | ICD-10-CM

## 2020-08-28 DIAGNOSIS — I10 Essential (primary) hypertension: Secondary | ICD-10-CM | POA: Diagnosis not present

## 2020-08-28 NOTE — Patient Instructions (Signed)
  Lab Work:  Your physician recommends that you HAVE LAB WORK TODAY  If you have labs (blood work) drawn today and your tests are completely normal, you will receive your results only by: Marland Kitchen MyChart Message (if you have MyChart) OR . A paper copy in the mail If you have any lab test that is abnormal or we need to change your treatment, we will call you to review the results.   Testing/Procedures:  Your physician has requested that you have an abdominal aorta duplex. During this test, an ultrasound is used to evaluate the aorta. Allow 30 minutes for this exam. Do not eat after midnight the day before and avoid carbonated beverages [place] [facility]   Follow-Up: At [facility], you and your health needs are our priority.  As part of our continuing mission to provide you with exceptional heart care, we have created designated Provider Care Teams.  These Care Teams include your primary Cardiologist (physician) and Advanced Practice Providers (APPs -  Physician Assistants and Nurse Practitioners) who all work together to provide you with the care you need, when you need it.  We recommend signing up for the patient portal called "MyChart".  Sign up information is provided on this After Visit Summary.  MyChart is used to connect with patients for Virtual Visits (Telemedicine).  Patients are able to view lab/test results, encounter notes, upcoming appointments, etc.  Non-urgent messages can be sent to your provider as well.   To learn more about what you can do with MyChart, go to [email or web address].    Your next appointment:   6 month(s)  The format for your next appointment:   In Person  Provider:   Kirk Ruths, MD

## 2020-08-29 ENCOUNTER — Encounter: Payer: Self-pay | Admitting: *Deleted

## 2020-08-29 LAB — LIPID PANEL
Chol/HDL Ratio: 5.1 ratio — ABNORMAL HIGH (ref 0.0–5.0)
Cholesterol, Total: 128 mg/dL (ref 100–199)
HDL: 25 mg/dL — ABNORMAL LOW (ref >39)
LDL Chol Calc (NIH): 64 mg/dL (ref 0–99)
Triglycerides: 240 mg/dL — ABNORMAL HIGH (ref 0–149)
VLDL Cholesterol Cal: 39 mg/dL (ref 5–40)

## 2020-08-29 LAB — COMPREHENSIVE METABOLIC PANEL
ALT: 19 IU/L (ref 0–44)
AST: 20 IU/L (ref 0–40)
Albumin/Globulin Ratio: 1.7 (ref 1.2–2.2)
Albumin: 4.5 g/dL (ref 3.7–4.7)
Alkaline Phosphatase: 62 IU/L (ref 44–121)
BUN/Creatinine Ratio: 22 (ref 10–24)
BUN: 33 mg/dL — ABNORMAL HIGH (ref 8–27)
Bilirubin Total: 0.4 mg/dL (ref 0.0–1.2)
CO2: 24 mmol/L (ref 20–29)
Calcium: 10.4 mg/dL — ABNORMAL HIGH (ref 8.6–10.2)
Chloride: 101 mmol/L (ref 96–106)
Creatinine, Ser: 1.51 mg/dL — ABNORMAL HIGH (ref 0.76–1.27)
GFR calc Af Amer: 50 mL/min/{1.73_m2} — ABNORMAL LOW (ref >59)
GFR calc non Af Amer: 43 mL/min/{1.73_m2} — ABNORMAL LOW (ref >59)
Globulin, Total: 2.6 g/dL (ref 1.5–4.5)
Glucose: 183 mg/dL — ABNORMAL HIGH (ref 65–99)
Potassium: 4.5 mmol/L (ref 3.5–5.2)
Sodium: 143 mmol/L (ref 134–144)
Total Protein: 7.1 g/dL (ref 6.0–8.5)

## 2020-09-10 ENCOUNTER — Other Ambulatory Visit: Payer: Self-pay

## 2020-09-10 ENCOUNTER — Ambulatory Visit: Payer: Medicare HMO | Admitting: Internal Medicine

## 2020-09-10 ENCOUNTER — Encounter: Payer: Self-pay | Admitting: Internal Medicine

## 2020-09-10 VITALS — BP 124/78 | HR 76 | Ht 70.0 in | Wt 246.3 lb

## 2020-09-10 DIAGNOSIS — Z6837 Body mass index (BMI) 37.0-37.9, adult: Secondary | ICD-10-CM | POA: Diagnosis not present

## 2020-09-10 DIAGNOSIS — E1159 Type 2 diabetes mellitus with other circulatory complications: Secondary | ICD-10-CM

## 2020-09-10 DIAGNOSIS — E785 Hyperlipidemia, unspecified: Secondary | ICD-10-CM

## 2020-09-10 DIAGNOSIS — Z794 Long term (current) use of insulin: Secondary | ICD-10-CM | POA: Diagnosis not present

## 2020-09-10 LAB — POCT GLYCOSYLATED HEMOGLOBIN (HGB A1C): Hemoglobin A1C: 6.2 % — AB (ref 4.0–5.6)

## 2020-09-10 NOTE — Patient Instructions (Addendum)
Please change: Insulin Before breakfast Before lunch Before dinner  Regular 25-32 25-32 25-32 (if sugars <100, take 15 units)  NPH 50  50   If you take dinnertime insulin after dinner, take 50% of the dose.  Please return in 4 months with your sugar log.

## 2020-09-10 NOTE — Progress Notes (Signed)
Patient ID: William Hendrix, male   DOB: 1941-01-31, 79 y.o.   MRN: [id number]  This visit occurred during the SARS-CoV-2 public health emergency.  Safety protocols were in place, including screening questions prior to the visit, additional usage of staff PPE, and extensive cleaning of exam room while observing appropriate contact time as indicated for disinfecting solutions.   HPI: William Hendrix is a 79 y.o.-year-old male, returning for f/u for DM2, dx 2009, insulin-dependent, uncontrolled, with complications (CAD s/p AMI 2017, CKD stage 3). Last visit 4 months ago.  In 2018, he eliminated meat almost completely and his sugars improved significantly while he also lost weight.  He continues to eat many vegetables, but he reintroduced meat.  With this, his insulin requirements increase, blood sugars remain controlled.  He continues to lose weight.  Reviewed HbA1c levels: Lab Results  Component Value Date   HGBA1C 6.7 (A) 04/30/2020   HGBA1C 8.0 (H) 11/21/2019   HGBA1C 7.2 (H) 08/14/2019   He is on: ReliOn insulin: Insulin Before breakfast Before lunch Before dinner  Regular 25-35 >> 30 25-35 >> 30 (occasionally skips) 25-35 >> 30 (if sugars <100, take 15 units)  NPH 50  50   If you take dinnertime insulin after dinner, take 50% of the dose.  We cannot use Metformin 2/2 CKD. We stopped Januvia >> could not afford ($100/3 mo)  We cannot use Levemir >> developed a severe skin reaction We stopped Amaryl 4 mg.  He checks sugars 3 times a day per review of his log: - am: 148, 167-251, 305, 311 >> 81, 87, 108-193, 202, 221 >> 64, 73, 81-146, 154 - Before lunch: 119-176, 193 >> 163-265 >> 97-142, 231 >> 110-133 - before dinner: 95,103-210 >> 111, 195-326 >> 73, 88-209, 220 >> 63, 74-194 - Before bedtime: 57, 120-145 >> 180-201 >> n/c >> 127-173, 207 Lowest sugar was   51 >> 70 >> 88 >> 111 >> 73 >> 63; it is unclear at which level he has hypoglycemia awareness. Highest sugar was  212 >>  326 >> 231 >> 207.  Meals: - Breakfast: oatmeal + toast + tomatoes - Lunch: beans  - Dinner:home cooked veggies   + CKD, last BUN/creatinine was:  Lab Results  Component Value Date   BUN 33 (H) 08/28/2020   CREATININE 1.51 (H) 08/28/2020   ACR levels were normal: Lab Results  Component Value Date   MICRALBCREAT 5.1 08/02/2014   MICRALBCREAT 0.7 02/23/2013   Reviewed GFR levels: Lab Results  Component Value Date   GFRAA 50 (L) 08/28/2020   GFRAA 48 (L) 01/09/2020   GFRAA 49 (L) 12/13/2019   GFRAA 48 (L) 04/22/2019   GFRAA 44 (L) 12/29/2018   GFRAA 39 (L) 11/12/2018   GFRAA 32 (L) 11/11/2018   GFRAA 44 (L) 09/16/2018   GFRAA 43 (L) 09/15/2018   GFRAA 46 (L) 08/21/2018   + HL; latest lipids: Lab Results  Component Value Date   CHOL 128 08/28/2020   HDL 25 (L) 08/28/2020   LDLCALC 64 08/28/2020   LDLDIRECT 57.0 11/21/2019   TRIG 240 (H) 08/28/2020   CHOLHDL 5.1 (H) 08/28/2020  On Lipitor, fenofibrate, flaxseed oil  Pt's last eye exam was 11/2019: No DR. Had cataract sx in 2011.  + numbness and tingling in his legs.  He had back surgery in 03/2016 and 10/2017.  He developed a kidney stone after his last surgery. He was admitted 2x in 2017 for: CP/SOB >> AMI.  He had 2  stents placed then. He had a cardiac cath 02/2017 >> no new blockages He was seen emergency room with colitis 04/22/2019.    PMH: Pt also also has a history of CAD, HTN, CKD stage III, history of nephrolithiasis, hiatal hernia, OSA, obesity, melanoma, spinal stenosis.  ROS: Constitutional: no weight gain/+ weight loss, no fatigue, no subjective hyperthermia, no subjective hypothermia Eyes: no blurry vision, no xerophthalmia ENT: no sore throat, no nodules palpated in neck, no dysphagia, no odynophagia, no hoarseness Cardiovascular: no CP/no SOB/no palpitations/no leg swelling Respiratory: no cough/no SOB/no wheezing Gastrointestinal: no N/no V/no D/no C/no acid reflux Musculoskeletal: + muscle  aches/+ joint aches (back) Skin: no rashes, no hair loss Neurological: + tremors/+ numbness/+ tingling/no dizziness  I reviewed pt's medications, allergies, PMH, social hx, family hx, and changes were documented in the history of present illness. Otherwise, unchanged from my initial visit note.  Past Medical History:  Diagnosis Date  . AAA (abdominal aortic aneurysm) ([place]) 12/15/2014   a. Mild aneurysmal dilatation of the infrarenal abdominal aorta 3.2 cm - f/u due by 2019.  . Arthritis    "all over" (07/30/2016)  . CAD (coronary artery disease)    a. stent to LAD 2000. b. possible spasm by cath 2001. c. IVUS/PTCA/DES to mLAD 05/2013. d. PTCA/DES of dRCA into ostial rPDA 07/2013. e. PTCA of OM2, DES to Cx, DES to [facility] 07/2016.  Marland Kitchen Chronic bronchitis ([place])   . Chronic diastolic CHF (congestive heart failure) ([place])   . Chronic lower back pain   . Chronic neck pain   . CKD (chronic kidney disease), stage III ([place])   . Diabetic peripheral neuropathy ([place])   . Ejection fraction    55%, 07/2010, mild inferior hypo  . GERD (gastroesophageal reflux disease)   . Gout   . H/O diverticulitis of colon 01/31/2015  . H/O hiatal hernia   . History of blood transfusion ~ 10/1940   "had pneumonia"  . History of kidney stones   . HTN (hypertension)   . Hyperlipidemia   . Melanoma of forearm, left ([place]) 02/2011   with wide excision   . Myocardial infarction ([place])   . Nephrolithiasis   . Obesity   . OSA on CPAP     [name] since 2000  . Peripheral neuropathy    both feet  . Positive D-dimer    a. significant elevation ,hospital 07/2010, etiology unclear. b. D Dimer chronically > 20.  Marland Kitchen Renal insufficiency   . Skin cancer   . Spinal stenosis    a. s/p surgical repair 2013  . Spinal stenosis of lumbar region   . Type II diabetes mellitus ([place])   . Ventral hernia    Past Surgical History:  Procedure Laterality Date  . ANTERIOR LAT LUMBAR FUSION  10/22/2017   Procedure: Lumbar two-three  Lumbar three-four Lumbar four-five Anteriolateral lumbar interbody fusion with percutaneous pedicle screw fixation and infuse;  Surgeon: [name], MD;  Location: [place];  Service: Neurosurgery;;  . APPLICATION OF ROBOTIC ASSISTANCE FOR SPINAL PROCEDURE  10/22/2017   Procedure: APPLICATION OF ROBOTIC ASSISTANCE FOR SPINAL PROCEDURE;  Surgeon: [name], MD;  Location: [place];  Service: Neurosurgery;;  . BACK SURGERY    . CARDIAC CATHETERIZATION N/A 07/30/2016   Procedure: Left Heart Cath and Coronary Angiography;  Surgeon: Nelva Bush, MD;  Location: [place] CV LAB;  Service: Cardiovascular;  Laterality: N/A;  . CARDIAC CATHETERIZATION N/A 07/30/2016   Procedure: Coronary Stent Intervention;  Surgeon: Nelva Bush, MD;  Location: [place] CV LAB;  Service: Cardiovascular;  Laterality: N/A;  Mid CFX and MID RCA  . CARDIAC CATHETERIZATION N/A 07/30/2016   Procedure: Coronary Balloon Angioplasty;  Surgeon: Nelva Bush, MD;  Location: [place] CV LAB;  Service: Cardiovascular;  Laterality: N/A;  OM 1  . CARPAL TUNNEL RELEASE Left   . CATARACT EXTRACTION W/ INTRAOCULAR LENS  IMPLANT, BILATERAL Bilateral ~ 2014  . CERVICAL DISC SURGERY  1990s  . CORONARY ANGIOPLASTY WITH STENT PLACEMENT  05/2013  . CORONARY ANGIOPLASTY WITH STENT PLACEMENT  07/26/2013   DES to RCA extending to PDA    . CORONARY ANGIOPLASTY WITH STENT PLACEMENT  07/30/2016  . CORONARY ANGIOPLASTY WITH STENT PLACEMENT  2000   CAD  . DENTAL SURGERY  04/2016   "got infected; had to dig it out"  . EYE SURGERY    . KNEE ARTHROSCOPY Right 1990's   rt  . LEFT AND RIGHT HEART CATHETERIZATION WITH CORONARY ANGIOGRAM N/A 07/26/2013   Procedure: LEFT AND RIGHT HEART CATHETERIZATION WITH CORONARY ANGIOGRAM;  Surgeon: [name], MD;  Location: [place] CATH LAB;  Service: Cardiovascular;  Laterality: N/A;  . LEFT HEART CATH AND CORONARY ANGIOGRAPHY N/A 02/26/2017   Procedure: Left Heart Cath and Coronary Angiography;   Surgeon: Sherren Mocha, MD;  Location: [place] CV LAB;  Service: Cardiovascular;  Laterality: N/A;  . LEFT HEART CATHETERIZATION WITH CORONARY ANGIOGRAM N/A 05/19/2013   Procedure: LEFT HEART CATHETERIZATION WITH CORONARY ANGIOGRAM;  Surgeon: [name], MD;  Location: [place] CATH LAB;  Service: Cardiovascular;  Laterality: N/A;  . LUMBAR LAMINECTOMY/DECOMPRESSION MICRODISCECTOMY  08/30/2012   Procedure: LUMBAR LAMINECTOMY/DECOMPRESSION MICRODISCECTOMY 2 LEVELS;  Surgeon: [name], MD;  Location: [place] NEURO ORS;  Service: Neurosurgery;  Laterality: Bilateral;  Bilateral Lumbar three-four Lumbar four-five Laminotomies  . LUMBAR PERCUTANEOUS PEDICLE SCREW 1 LEVEL Bilateral 10/22/2017   Procedure: LUMBAR PERCUTANEOUS PEDICLE SCREW 1 LEVEL;  Surgeon: [name], MD;  Location: [place];  Service: Neurosurgery;  Laterality: Bilateral;  . LUMBAR SPINE SURGERY  04/07/2016   Dr. Ellene Route; "?ruptured disc"  . MELANOMA EXCISION Left 02/2011   forearm  . NASAL SINUS SURGERY  1970s   "cut windows in sinus pockets"  . PERCUTANEOUS CORONARY STENT INTERVENTION (PCI-S)  05/19/2013   Procedure: PERCUTANEOUS CORONARY STENT INTERVENTION (PCI-S);  Surgeon: [name], MD;  [facility] CATH LAB;  Service: Cardiovascular;;  . ULNAR TUNNEL RELEASE Left 04/07/2016   Dr. Ellene Route   Social History   Socioeconomic History  . Marital status: Married    Spouse name: Not on file  . Number of children: Not on file  . Years of education: Not on file  . Highest education level: Not on file  Occupational History  . Occupation: Painter  Tobacco Use  . Smoking status: Former Smoker    Packs/day: 3.00    Years: 30.00    Pack years: 90.00    Types: Cigarettes    Quit date: 09/17/1996    Years since quitting: 23.9  . Smokeless tobacco: Never Used  Vaping Use  . Vaping Use: Never used  Substance and Sexual Activity  . Alcohol use: No  . Drug use: No  . Sexual activity: Not on file  Other Topics Concern  .  Not on file  Social History Narrative   Married and lives locally with his wife.  Sharyon Cable.   Social Determinants of Health   Financial Resource Strain:   . Difficulty of Paying Living Expenses: Not on file  Food Insecurity:   .  Worried About Charity fundraiser in the Last Year: Not on file  . Ran Out of Food in the Last Year: Not on file  Transportation Needs:   . Lack of Transportation (Medical): Not on file  . Lack of Transportation (Non-Medical): Not on file  Physical Activity:   . Days of Exercise per Week: Not on file  . Minutes of Exercise per Session: Not on file  Stress:   . Feeling of Stress : Not on file  Social Connections:   . Frequency of Communication with Friends and Family: Not on file  . Frequency of Social Gatherings with Friends and Family: Not on file  . Attends Religious Services: Not on file  . Active Member of Clubs or Organizations: Not on file  . Attends Archivist Meetings: Not on file  . Marital Status: Not on file  Intimate Partner Violence:   . Fear of Current or Ex-Partner: Not on file  . Emotionally Abused: Not on file  . Physically Abused: Not on file  . Sexually Abused: Not on file   Current Outpatient Medications on File Prior to Visit  Medication Sig Dispense Refill  . acetaminophen (TYLENOL) 500 MG tablet Take 500 mg by mouth 2 (two) times daily.     Marland Kitchen alfuzosin (UROXATRAL) 10 MG 24 hr tablet Take 10 mg by mouth daily with breakfast.    . allopurinol (ZYLOPRIM) 300 MG tablet TAKE 1 TABLET EVERY DAY 90 tablet 1  . aspirin EC 81 MG tablet Take 81 mg by mouth daily.    Marland Kitchen atorvastatin (LIPITOR) 40 MG tablet TAKE 1 TABLET EVERY DAY 90 tablet 1  . B Complex Vitamins (B COMPLEX-B12 PO) Take 1 tablet by mouth daily.     . Blood Glucose Monitoring Suppl (TRUE METRIX AIR GLUCOSE METER) DEVI Use to check blood sugar. 1 each 0  . Chlorpheniramine Maleate (CHLOR-TRIMETON ALLERGY PO) Take 1 tablet by mouth every 6 (six) hours as needed  (allergies).    . Cholecalciferol (VITAMIN D3) 50 MCG (2000 UT) capsule Take 2,000 Units by mouth at bedtime.     . fenofibrate 160 MG tablet TAKE 1 TABLET (160 MG TOTAL) BY MOUTH AT BEDTIME. 90 tablet 1  . fluocinonide cream (LIDEX) 2.40 % Apply 1 application topically daily as needed (for rashes).     . fluticasone (FLONASE) 50 MCG/ACT nasal spray Place 2 sprays into both nostrils daily. (Patient taking differently: Place 2 sprays into both nostrils daily as needed for allergies. ) 16 g 1  . furosemide (LASIX) 40 MG tablet Take 2 tablets (80 mg total) by mouth daily. 180 tablet 3  . HYDROcodone-acetaminophen (NORCO) 10-325 MG tablet Take 0.5 tablets by mouth 2 (two) times daily. 60 tablet 0  . insulin NPH Human (NOVOLIN N) 100 UNIT/ML injection Inject 0.5 mLs (50 Units total) into the skin 2 (two) times daily before a meal. 10 mL 11  . insulin regular (NOVOLIN R RELION) 100 units/mL injection Inject 25-30 units 3 times a day before meals. 60 mL 1  . Insulin Syringe-Needle U-100 (BD INSULIN SYRINGE U/F) 31G X 5/16" 0.3 ML MISC Use to inject insulin 5 times a day. 500 each 11  . isosorbide mononitrate (IMDUR) 30 MG 24 hr tablet TAKE 1 TABLET (30 MG TOTAL) BY MOUTH DAILY. 90 tablet 3  . MAGNESIUM PO Take 1 tablet by mouth at bedtime.    . Melatonin 10 MG TABS Take 10 mg by mouth at bedtime.    Marland Kitchen  metoprolol tartrate (LOPRESSOR) 25 MG tablet Take 1 tablet (25 mg total) by mouth 2 (two) times daily. (Patient taking differently: Take 12.5 mg by mouth 2 (two) times daily. ) 180 tablet 1  . nitroGLYCERIN (NITROSTAT) 0.4 MG SL tablet Place 1 tablet (0.4 mg total) under the tongue every 5 (five) minutes as needed for chest pain. 100 tablet 3  . pantoprazole (PROTONIX) 20 MG tablet Take 1 tablet (20 mg total) by mouth daily. 90 tablet 3  . Probiotic Product (PROBIOTIC PO) Take 1 capsule by mouth at bedtime.     Marland Kitchen tiZANidine (ZANAFLEX) 4 MG tablet Take 4 mg by mouth 2 (two) times daily.  6  . TRUE METRIX  BLOOD GLUCOSE TEST test strip TEST BLOOD SUGAR THREE TIMES DAILY AS DIRECTED (Patient taking differently: 1 each by Other route in the morning, at noon, and at bedtime. ) 300 strip 11  . TRUEplus Lancets 33G MISC Use to check blood sugar 3 times a day (Patient taking differently: 1 each by Other route in the morning, at noon, and at bedtime. Use to check blood sugar 3 times a day) 300 each 11   No current facility-administered medications on file prior to visit.   Allergies  Allergen Reactions  . Brilinta [Ticagrelor] Shortness Of Breath  . Insulin Detemir Swelling, Other (See Comments) and Itching    Patient had redness and swelling and tenderness at injection site. Patient had redness and swelling and tenderness at injection site.  . Bee Venom   . Codeine Other (See Comments)    Makes him "shakey", is OK with hydrocodone GI UPSET & TREMORS Makes him "shakey", is OK with hydrocodone Makes him "shakey", is OK with hydrocodone GI UPSET & TREMORS  . Lipitor [Atorvastatin] Other (See Comments)    Muscle aches; affects liver function- Patient is currently taking  . Novolog Mix [Insulin Aspart Prot & Aspart] Other (See Comments)    Causes skin to swell/itch at injection site   Family History  Problem Relation Age of Onset  . Cancer Mother        intestinal   . Heart disease Mother   . Cancer Father        prostate  . Migraines Daughter   . Leukemia Sister   . Heart disease Sister   . Prostate cancer Other   . Kidney cancer Other   . Cancer Other        Bladder cancer  . Coronary artery disease Other   . Hypertension Sister   . Hypertension Brother   . Heart attack Neg Hx   . Stroke Neg Hx     PE: BP 124/78   Pulse 76   Ht 5\' 10"  (1.778 m)   Wt 246 lb 4.8 oz (111.7 kg)   SpO2 97%   BMI 35.34 kg/m  Body mass index is 35.34 kg/m. Wt Readings from Last 3 Encounters:  09/10/20 246 lb 4.8 oz (111.7 kg)  08/28/20 246 lb 11.2 oz (111.9 kg)  04/30/20 251 lb (113.9 kg)    Constitutional: overweight, in NAD Eyes: PERRLA, EOMI, no exophthalmos ENT: moist mucous membranes, no thyromegaly, no cervical lymphadenopathy Cardiovascular: RRR, No MRG Respiratory: CTA B Gastrointestinal: abdomen soft, NT, ND, BS+ Musculoskeletal: no deformities, strength intact in all 4 Skin: moist, warm, no rashes Neurological: + tremor with outstretched hands, DTR normal in all 4  ASSESSMENT: 1. DM2, insulin-dependent, uncontrolled, with complications - CAD, s/p stents - He had stenting of distal RCA/PDA in 07/2013. Prior  stenting of LAD in 04/2013. 2 new stents: PCI to left circumflex and the mid RCA on 07/30/2016. - AAA - CKD stage 3  2. HL  3.  Obesity class II BMI Classification:  < 18.5 underweight   18.5-24.9 normal weight   25.0-29.9 overweight   30.0-34.9 class I obesity   35.0-39.9 class II obesity   ? 40.0 class III obesity   PLAN:  1. Patient with history of uncontrolled type 2 diabetes, with better control recently, on basal-bolus insulin regimen with NPH and regular insulin due to price.  He had a significant improvement in his blood sugars after changing to a mostly plant-based diet, even though she relaxed the diet afterwards.  At last visit, he was taking a higher dose of NPH, after his sugars started to increase, but sugars appear to be fairly well-controlled at that time so we did not change the regimen.  He did have some higher blood sugars before meals but these were due to snacks between meals.  I advised him to ideally skip the snacks but if he had to eat them, then cover them with few units of regular insulin -At today's visit, he tells me that he is not frequently varying the dose of regular insulin depending on his meals. As a consequence, he occasionally has lower blood sugars, few in the 60s, and also some blood sugars in the 170s to 190s especially before dinner. He had one blood sugar in the 200s in the last month and a half, after dinner.  Otherwise, sugars are mostly at goal, especially in the morning, when they have improved significantly since last visit. For now, I recommended to occasionally use a higher dose of regular insulin if he is eating a larger meal and the lower dose if he is eating a smaller meal, but otherwise, I did not suggest a change in the regimen. - I advised him to: Patient Instructions   Please change: Insulin Before breakfast Before lunch Before dinner  Regular 25-32 25-32 25-32 (if sugars <100, take 15 units)  NPH 50  50   If you take dinnertime insulin after dinner, take 50% of the dose.  Please return in 4 months with your sugar log.   - we checked his HbA1c: 6.2% (improved) - advised to check sugars at different times of the day - 3x a day, rotating check times - advised for yearly eye exams >> he is UTD - return to clinic in 4 months  2. HL -Reviewed latest lipid panel from earlier this month, LDL at goal, triglycerides high, HDL low: Lab Results  Component Value Date   CHOL 128 08/28/2020   HDL 25 (L) 08/28/2020   LDLCALC 64 08/28/2020   LDLDIRECT 57.0 11/21/2019   TRIG 240 (H) 08/28/2020   CHOLHDL 5.1 (H) 08/28/2020  -She continues on Lipitor 40, fenofibrate 160, flaxseed oil without side effects  3.  Obesity class 2 -He did well in the past and a plant-based diet.  He is eating some meat but mostly stays on the diet. -He lost 30 pounds before last visit and 5 more since then.  [name], MD PhD [facility] Endocrinology

## 2020-09-12 ENCOUNTER — Other Ambulatory Visit: Payer: Self-pay | Admitting: Internal Medicine

## 2020-09-20 ENCOUNTER — Other Ambulatory Visit: Payer: Self-pay

## 2020-09-20 ENCOUNTER — Ambulatory Visit (HOSPITAL_BASED_OUTPATIENT_CLINIC_OR_DEPARTMENT_OTHER)
Admission: RE | Admit: 2020-09-20 | Discharge: 2020-09-20 | Disposition: A | Discharge status: Home / Self Care | Payer: Medicare HMO | Source: Ambulatory Visit | Attending: Cardiology | Admitting: Cardiology

## 2020-09-20 DIAGNOSIS — I714 Abdominal aortic aneurysm, without rupture, unspecified: Secondary | ICD-10-CM

## 2020-09-24 ENCOUNTER — Encounter: Payer: Self-pay | Admitting: Family Medicine

## 2020-09-24 ENCOUNTER — Other Ambulatory Visit: Payer: Self-pay

## 2020-09-24 ENCOUNTER — Encounter: Payer: Self-pay | Admitting: *Deleted

## 2020-09-24 ENCOUNTER — Ambulatory Visit (INDEPENDENT_AMBULATORY_CARE_PROVIDER_SITE_OTHER): Payer: Medicare HMO | Admitting: Family Medicine

## 2020-09-24 DIAGNOSIS — Z6837 Body mass index (BMI) 37.0-37.9, adult: Secondary | ICD-10-CM | POA: Diagnosis not present

## 2020-09-24 DIAGNOSIS — G4733 Obstructive sleep apnea (adult) (pediatric): Secondary | ICD-10-CM

## 2020-09-24 DIAGNOSIS — I10 Essential (primary) hypertension: Secondary | ICD-10-CM | POA: Diagnosis not present

## 2020-09-24 DIAGNOSIS — E118 Type 2 diabetes mellitus with unspecified complications: Secondary | ICD-10-CM

## 2020-09-24 DIAGNOSIS — M48062 Spinal stenosis, lumbar region with neurogenic claudication: Secondary | ICD-10-CM | POA: Diagnosis not present

## 2020-09-24 DIAGNOSIS — E785 Hyperlipidemia, unspecified: Secondary | ICD-10-CM

## 2020-09-24 DIAGNOSIS — N289 Disorder of kidney and ureter, unspecified: Secondary | ICD-10-CM | POA: Diagnosis not present

## 2020-09-24 DIAGNOSIS — Z794 Long term (current) use of insulin: Secondary | ICD-10-CM

## 2020-09-24 DIAGNOSIS — R252 Cramp and spasm: Secondary | ICD-10-CM | POA: Diagnosis not present

## 2020-09-24 MED ORDER — HYDROCODONE-ACETAMINOPHEN 5-325 MG PO TABS: 0.5000 | ORAL_TABLET | Freq: Every evening | ORAL | 0 refills | Status: DC | PRN

## 2020-09-24 MED ORDER — TIZANIDINE HCL 4 MG PO TABS: 4.0000 mg | ORAL_TABLET | Freq: Every evening | ORAL | 5 refills | Status: DC | PRN

## 2020-09-24 NOTE — Patient Instructions (Signed)

## 2020-09-25 NOTE — Assessment & Plan Note (Signed)
Hydrate and monitor 

## 2020-09-25 NOTE — Assessment & Plan Note (Signed)
He has had numerous surgeries with Dr Roselee Culver his neurosurgeon and he is no longer a surgical candidate so he will no longer being seeing him. We will take over very low dose pain meds as he only uses 2.5 mg of hydrocodone to use qhs with his muscle relaxer.

## 2020-09-25 NOTE — Assessment & Plan Note (Signed)
Well controlled, no changes to meds. Encouraged heart healthy diet such as the DASH diet and exercise as tolerated.  °

## 2020-09-25 NOTE — Progress Notes (Signed)
Subjective:    Patient ID: William Hendrix, male    DOB: 03/25/1941, 79 y.o.   MRN: [id number]  Chief Complaint  Patient presents with  . Medication Refill    HPI Patient is in today for follow up on chronic medical concerns. No recent febrile illness or hospitalizations. He is struggling with daily pain in his neck, back, joints and myalgias. He is no longer a surgical candidate and so is no longer following with neurosurgery. Denies CP/palp/HA/congestion/fevers/GI or GU c/o. Taking meds as prescribed.  Past Medical History:  Diagnosis Date  . AAA (abdominal aortic aneurysm) ([place]) 12/15/2014   a. Mild aneurysmal dilatation of the infrarenal abdominal aorta 3.2 cm - f/u due by 2019.  . Arthritis    "all over" (07/30/2016)  . CAD (coronary artery disease)    a. stent to LAD 2000. b. possible spasm by cath 2001. c. IVUS/PTCA/DES to mLAD 05/2013. d. PTCA/DES of dRCA into ostial rPDA 07/2013. e. PTCA of OM2, DES to Cx, DES to [facility] 07/2016.  Marland Kitchen Chronic bronchitis ([place])   . Chronic diastolic CHF (congestive heart failure) ([place])   . Chronic lower back pain   . Chronic neck pain   . CKD (chronic kidney disease), stage III ([place])   . Diabetic peripheral neuropathy ([place])   . Ejection fraction    55%, 07/2010, mild inferior hypo  . GERD (gastroesophageal reflux disease)   . Gout   . H/O diverticulitis of colon 01/31/2015  . H/O hiatal hernia   . History of blood transfusion ~ 10/1941   "had pneumonia"  . History of kidney stones   . HTN (hypertension)   . Hyperlipidemia   . Melanoma of forearm, left ([place]) 02/2011   with wide excision   . Myocardial infarction ([place])   . Nephrolithiasis   . Obesity   . OSA on CPAP     [name] since 2000  . Peripheral neuropathy    both feet  . Positive D-dimer    a. significant elevation ,hospital 07/2010, etiology unclear. b. D Dimer chronically > 20.  Marland Kitchen Renal insufficiency   . Skin cancer   . Spinal stenosis    a. s/p surgical repair 2013  .  Spinal stenosis of lumbar region   . Type II diabetes mellitus ([place])   . Ventral hernia     Past Surgical History:  Procedure Laterality Date  . ANTERIOR LAT LUMBAR FUSION  10/22/2017   Procedure: Lumbar two-three Lumbar three-four Lumbar four-five Anteriolateral lumbar interbody fusion with percutaneous pedicle screw fixation and infuse;  Surgeon: [name], MD;  Location: [place];  Service: Neurosurgery;;  . APPLICATION OF ROBOTIC ASSISTANCE FOR SPINAL PROCEDURE  10/22/2017   Procedure: APPLICATION OF ROBOTIC ASSISTANCE FOR SPINAL PROCEDURE;  Surgeon: [name], MD;  Location: [place];  Service: Neurosurgery;;  . BACK SURGERY    . CARDIAC CATHETERIZATION N/A 07/30/2016   Procedure: Left Heart Cath and Coronary Angiography;  Surgeon: Nelva Bush, MD;  Location: [place] CV LAB;  Service: Cardiovascular;  Laterality: N/A;  . CARDIAC CATHETERIZATION N/A 07/30/2016   Procedure: Coronary Stent Intervention;  Surgeon: Nelva Bush, MD;  Location: [place] CV LAB;  Service: Cardiovascular;  Laterality: N/A;  Mid CFX and MID RCA  . CARDIAC CATHETERIZATION N/A 07/30/2016   Procedure: Coronary Balloon Angioplasty;  Surgeon: Nelva Bush, MD;  Location: [place] CV LAB;  Service: Cardiovascular;  Laterality: N/A;  OM 1  . CARPAL TUNNEL RELEASE Left   . CATARACT EXTRACTION  W/ INTRAOCULAR LENS  IMPLANT, BILATERAL Bilateral ~ 2014  . CERVICAL DISC SURGERY  1990s  . CORONARY ANGIOPLASTY WITH STENT PLACEMENT  05/2013  . CORONARY ANGIOPLASTY WITH STENT PLACEMENT  07/26/2013   DES to RCA extending to PDA    . CORONARY ANGIOPLASTY WITH STENT PLACEMENT  07/30/2016  . CORONARY ANGIOPLASTY WITH STENT PLACEMENT  2000   CAD  . DENTAL SURGERY  04/2016   "got infected; had to dig it out"  . EYE SURGERY    . KNEE ARTHROSCOPY Right 1990's   rt  . LEFT AND RIGHT HEART CATHETERIZATION WITH CORONARY ANGIOGRAM N/A 07/26/2013   Procedure: LEFT AND RIGHT HEART CATHETERIZATION WITH CORONARY  ANGIOGRAM;  Surgeon: [name], MD;  Location: [place] CATH LAB;  Service: Cardiovascular;  Laterality: N/A;  . LEFT HEART CATH AND CORONARY ANGIOGRAPHY N/A 02/26/2017   Procedure: Left Heart Cath and Coronary Angiography;  Surgeon: Sherren Mocha, MD;  Location: [place] CV LAB;  Service: Cardiovascular;  Laterality: N/A;  . LEFT HEART CATHETERIZATION WITH CORONARY ANGIOGRAM N/A 05/19/2013   Procedure: LEFT HEART CATHETERIZATION WITH CORONARY ANGIOGRAM;  Surgeon: [name], MD;  Location: [facility] CATH LAB;  Service: Cardiovascular;  Laterality: N/A;  . LUMBAR LAMINECTOMY/DECOMPRESSION MICRODISCECTOMY  08/30/2012   Procedure: LUMBAR LAMINECTOMY/DECOMPRESSION MICRODISCECTOMY 2 LEVELS;  Surgeon: [name], MD;  Location: [place] NEURO ORS;  Service: Neurosurgery;  Laterality: Bilateral;  Bilateral Lumbar three-four Lumbar four-five Laminotomies  . LUMBAR PERCUTANEOUS PEDICLE SCREW 1 LEVEL Bilateral 10/22/2017   Procedure: LUMBAR PERCUTANEOUS PEDICLE SCREW 1 LEVEL;  Surgeon: [name], MD;  Location: [place];  Service: Neurosurgery;  Laterality: Bilateral;  . LUMBAR SPINE SURGERY  04/07/2016   Dr. Ellene Route; "?ruptured disc"  . MELANOMA EXCISION Left 02/2011   forearm  . NASAL SINUS SURGERY  1970s   "cut windows in sinus pockets"  . PERCUTANEOUS CORONARY STENT INTERVENTION (PCI-S)  05/19/2013   Procedure: PERCUTANEOUS CORONARY STENT INTERVENTION (PCI-S);  Surgeon: [name], MD;  Location: [facility] CATH LAB;  Service: Cardiovascular;;  . ULNAR TUNNEL RELEASE Left 04/07/2016   Dr. Ellene Route    Family History  Problem Relation Age of Onset  . Cancer Mother        intestinal   . Heart disease Mother   . Cancer Father        prostate  . Migraines Daughter   . Leukemia Sister   . Heart disease Sister   . Prostate cancer Other   . Kidney cancer Other   . Cancer Other        Bladder cancer  . Coronary artery disease Other   . Hypertension Sister   . Hypertension Brother   . Heart attack Neg Hx    . Stroke Neg Hx     Social History   Socioeconomic History  . Marital status: Married    Spouse name: Not on file  . Number of children: Not on file  . Years of education: Not on file  . Highest education level: Not on file  Occupational History  . Occupation: Painter  Tobacco Use  . Smoking status: Former Smoker    Packs/day: 3.00    Years: 30.00    Pack years: 90.00    Types: Cigarettes    Quit date: 09/17/1996    Years since quitting: 24.0  . Smokeless tobacco: Never Used  Vaping Use  . Vaping Use: Never used  Substance and Sexual Activity  . Alcohol use: No  . Drug use: No  .  Sexual activity: Not on file  Other Topics Concern  . Not on file  Social History Narrative   Married and lives locally with his wife.  Sharyon Cable.   Social Determinants of Health   Financial Resource Strain: Not on file  Food Insecurity: Not on file  Transportation Needs: Not on file  Physical Activity: Not on file  Stress: Not on file  Social Connections: Not on file  Intimate Partner Violence: Not on file    Outpatient Medications Prior to Visit  Medication Sig Dispense Refill  . acetaminophen (TYLENOL) 500 MG tablet Take 500 mg by mouth 2 (two) times daily.     Marland Kitchen alfuzosin (UROXATRAL) 10 MG 24 hr tablet Take 10 mg by mouth daily with breakfast.    . allopurinol (ZYLOPRIM) 300 MG tablet TAKE 1 TABLET EVERY DAY 90 tablet 1  . aspirin EC 81 MG tablet Take 81 mg by mouth daily.    Marland Kitchen atorvastatin (LIPITOR) 40 MG tablet TAKE 1 TABLET EVERY DAY 90 tablet 1  . B Complex Vitamins (B COMPLEX-B12 PO) Take 1 tablet by mouth daily.     . Blood Glucose Monitoring Suppl (TRUE METRIX AIR GLUCOSE METER) DEVI Use to check blood sugar. 1 each 0  . Chlorpheniramine Maleate (CHLOR-TRIMETON ALLERGY PO) Take 1 tablet by mouth every 6 (six) hours as needed (allergies).    . Cholecalciferol (VITAMIN D3) 50 MCG (2000 UT) capsule Take 2,000 Units by mouth at bedtime.     . fenofibrate 160 MG tablet TAKE 1  TABLET (160 MG TOTAL) BY MOUTH AT BEDTIME. 90 tablet 1  . fluocinonide cream (LIDEX) 4.70 % Apply 1 application topically daily as needed (for rashes).     . fluticasone (FLONASE) 50 MCG/ACT nasal spray Place 2 sprays into both nostrils daily. (Patient taking differently: Place 2 sprays into both nostrils daily as needed for allergies.) 16 g 1  . furosemide (LASIX) 40 MG tablet Take 2 tablets (80 mg total) by mouth daily. 180 tablet 3  . insulin NPH Human (NOVOLIN N) 100 UNIT/ML injection Inject 0.5 mLs (50 Units total) into the skin 2 (two) times daily before a meal. 10 mL 11  . insulin regular (NOVOLIN R RELION) 100 units/mL injection Inject 25-30 units 3 times a day before meals. 60 mL 1  . Insulin Syringe-Needle U-100 (BD INSULIN SYRINGE U/F) 31G X 5/16" 0.3 ML MISC Use to inject insulin 5 times a day. 500 each 11  . isosorbide mononitrate (IMDUR) 30 MG 24 hr tablet TAKE 1 TABLET (30 MG TOTAL) BY MOUTH DAILY. 90 tablet 3  . MAGNESIUM PO Take 1 tablet by mouth at bedtime.    . Melatonin 10 MG TABS Take 10 mg by mouth at bedtime.    . metoprolol tartrate (LOPRESSOR) 25 MG tablet Take 1 tablet (25 mg total) by mouth 2 (two) times daily. (Patient taking differently: Take 12.5 mg by mouth 2 (two) times daily.) 180 tablet 1  . nitroGLYCERIN (NITROSTAT) 0.4 MG SL tablet Place 1 tablet (0.4 mg total) under the tongue every 5 (five) minutes as needed for chest pain. 100 tablet 3  . pantoprazole (PROTONIX) 20 MG tablet Take 1 tablet (20 mg total) by mouth daily. 90 tablet 3  . Probiotic Product (PROBIOTIC PO) Take 1 capsule by mouth at bedtime.     . TRUE METRIX BLOOD GLUCOSE TEST test strip TEST BLOOD SUGAR THREE TIMES DAILY AS DIRECTED 300 strip 11  . TRUEplus Lancets 33G MISC Use to check blood sugar  3 times a day (Patient taking differently: 1 each by Other route in the morning, at noon, and at bedtime. Use to check blood sugar 3 times a day) 300 each 11  . HYDROcodone-acetaminophen (NORCO) 10-325 MG  tablet Take 0.5 tablets by mouth 2 (two) times daily. 60 tablet 0  . tiZANidine (ZANAFLEX) 4 MG tablet Take 4 mg by mouth 2 (two) times daily.  6   No facility-administered medications prior to visit.    Allergies  Allergen Reactions  . Brilinta [Ticagrelor] Shortness Of Breath  . Insulin Detemir Swelling, Other (See Comments) and Itching    Patient had redness and swelling and tenderness at injection site. Patient had redness and swelling and tenderness at injection site.  . Bee Venom   . Codeine Other (See Comments)    Makes him "shakey", is OK with hydrocodone GI UPSET & TREMORS Makes him "shakey", is OK with hydrocodone Makes him "shakey", is OK with hydrocodone GI UPSET & TREMORS  . Lipitor [Atorvastatin] Other (See Comments)    Muscle aches; affects liver function- Patient is currently taking  . Novolog Mix [Insulin Aspart Prot & Aspart] Other (See Comments)    Causes skin to swell/itch at injection site    Review of Systems  Constitutional: Positive for malaise/fatigue. Negative for fever.  HENT: Negative for congestion.   Eyes: Negative for blurred vision.  Respiratory: Positive for shortness of breath.   Cardiovascular: Negative for chest pain, palpitations and leg swelling.  Gastrointestinal: Negative for abdominal pain, blood in stool and nausea.  Genitourinary: Negative for dysuria and frequency.  Musculoskeletal: Positive for back pain, joint pain, myalgias and neck pain. Negative for falls.  Skin: Negative for rash.  Neurological: Negative for dizziness, loss of consciousness and headaches.  Endo/Heme/Allergies: Negative for environmental allergies.  Psychiatric/Behavioral: Negative for depression. The patient is not nervous/anxious.        Objective:    Physical Exam Vitals and nursing note reviewed.  Constitutional:      General: He is not in acute distress.    Appearance: He is well-developed and well-nourished.  HENT:     Head: Normocephalic and  atraumatic.     Nose: Nose normal.  Eyes:     General:        Right eye: No discharge.        Left eye: No discharge.  Cardiovascular:     Rate and Rhythm: Normal rate and regular rhythm.     Heart sounds: No murmur heard.   Pulmonary:     Effort: Pulmonary effort is normal.     Breath sounds: Normal breath sounds.  Abdominal:     General: Bowel sounds are normal.     Palpations: Abdomen is soft.     Tenderness: There is no abdominal tenderness.  Musculoskeletal:        General: No edema.     Cervical back: Normal range of motion and neck supple.  Skin:    General: Skin is warm and dry.  Neurological:     Mental Status: He is alert and oriented to person, place, and time.  Psychiatric:        Mood and Affect: Mood and affect normal.     BP 122/80   Pulse 80   Temp 97.9 F (36.6 C) (Oral)   Resp 18   Wt 246 lb 11.2 oz (111.9 kg)   SpO2 95%   BMI 35.40 kg/m  Wt Readings from Last 3 Encounters:  09/24/20 246 lb 11.2  oz (111.9 kg)  09/10/20 246 lb 4.8 oz (111.7 kg)  08/28/20 246 lb 11.2 oz (111.9 kg)    Diabetic Foot Exam - Simple   No data filed    Lab Results  Component Value Date   WBC 8.2 01/09/2020   HGB 12.4 (L) 01/09/2020   HCT 38.1 (L) 01/09/2020   PLT 170 01/09/2020   GLUCOSE 183 (H) 08/28/2020   CHOL 128 08/28/2020   TRIG 240 (H) 08/28/2020   HDL 25 (L) 08/28/2020   LDLDIRECT 57.0 11/21/2019   LDLCALC 64 08/28/2020   ALT 19 08/28/2020   AST 20 08/28/2020   NA 143 08/28/2020   K 4.5 08/28/2020   CL 101 08/28/2020   CREATININE 1.51 (H) 08/28/2020   BUN 33 (H) 08/28/2020   CO2 24 08/28/2020   TSH 4.59 (H) 11/21/2019   PSA 1.01 11/02/2018   INR 1.1 02/24/2017   HGBA1C 6.2 (A) 09/10/2020   MICROALBUR 0.7 08/02/2014    Lab Results  Component Value Date   TSH 4.59 (H) 11/21/2019   Lab Results  Component Value Date   WBC 8.2 01/09/2020   HGB 12.4 (L) 01/09/2020   HCT 38.1 (L) 01/09/2020   MCV 93.2 01/09/2020   PLT 170 01/09/2020    Lab Results  Component Value Date   NA 143 08/28/2020   K 4.5 08/28/2020   CO2 24 08/28/2020   GLUCOSE 183 (H) 08/28/2020   BUN 33 (H) 08/28/2020   CREATININE 1.51 (H) 08/28/2020   BILITOT 0.4 08/28/2020   ALKPHOS 62 08/28/2020   AST 20 08/28/2020   ALT 19 08/28/2020   PROT 7.1 08/28/2020   ALBUMIN 4.5 08/28/2020   CALCIUM 10.4 (H) 08/28/2020   ANIONGAP 11 01/09/2020   GFR 43.72 (L) 11/21/2019   Lab Results  Component Value Date   CHOL 128 08/28/2020   Lab Results  Component Value Date   HDL 25 (L) 08/28/2020   Lab Results  Component Value Date   LDLCALC 64 08/28/2020   Lab Results  Component Value Date   TRIG 240 (H) 08/28/2020   Lab Results  Component Value Date   CHOLHDL 5.1 (H) 08/28/2020   Lab Results  Component Value Date   HGBA1C 6.2 (A) 09/10/2020       Assessment & Plan:   Problem List Items Addressed This Visit    Renal insufficiency    Hydrate and monitor      Hyperlipidemia    Encouraged heart healthy diet, increase exercise, avoid trans fats, consider a krill oil cap daily. Tolerating Atorvastatin and Fenofibrate. He tried to hold Atorvastatin to see if it helped his myalgias but it did not.       Essential hypertension    Well controlled, no changes to meds. Encouraged heart healthy diet such as the DASH diet and exercise as tolerated.       OSA (obstructive sleep apnea)    Using CPAP      Controlled diabetes mellitus type 2 with complications (HCC)    CHYI5O acceptable, minimize simple carbs. Increase exercise as tolerated. Continue current meds      Obesity    Encouraged DASH or MIND diet, decrease po intake and increase exercise as tolerated. Needs 7-8 hours of sleep nightly. Avoid trans fats, eat small, frequent meals every 4-5 hours with lean proteins, complex carbs and healthy fats. Minimize simple carbs, GMO foods.      Lumbar stenosis with neurogenic claudication    He has had numerous surgeries  with Dr Roselee Culver his  neurosurgeon and he is no longer a surgical candidate so he will no longer being seeing him. We will take over very low dose pain meds as he only uses 2.5 mg of hydrocodone to use qhs with his muscle relaxer.       Relevant Medications   tiZANidine (ZANAFLEX) 4 MG tablet   HYDROcodone-acetaminophen (NORCO) 5-325 MG tablet   Muscle cramps    Encouraged increased hydration, topical rubs and to stay as active as possible         I have discontinued Elyn Aquas. [place] "Bob"'s HYDROcodone-acetaminophen. I have also changed his tiZANidine. Additionally, I am having him start on HYDROcodone-acetaminophen. Lastly, I am having him maintain his fluocinonide cream, aspirin EC, acetaminophen, B Complex Vitamins (B COMPLEX-B12 PO), fluticasone, Probiotic Product (PROBIOTIC PO), nitroGLYCERIN, Vitamin D3, MAGNESIUM PO, TRUEplus Lancets 33G, Insulin Syringe-Needle U-100, furosemide, insulin regular, Chlorpheniramine Maleate (CHLOR-TRIMETON ALLERGY PO), Melatonin, insulin NPH Human, True Metrix Air Glucose Meter, pantoprazole, fenofibrate, atorvastatin, allopurinol, alfuzosin, metoprolol tartrate, isosorbide mononitrate, and True Metrix Blood Glucose Test.  Meds ordered this encounter  Medications  . tiZANidine (ZANAFLEX) 4 MG tablet    Sig: Take 1 tablet (4 mg total) by mouth at bedtime as needed for muscle spasms.    Dispense:  30 tablet    Refill:  5  . HYDROcodone-acetaminophen (NORCO) 5-325 MG tablet    Sig: Take 0.5-1 tablets by mouth at bedtime as needed for moderate pain.    Dispense:  30 tablet    Refill:  0     Penni Homans, MD

## 2020-09-25 NOTE — Assessment & Plan Note (Signed)
hgba1c acceptable, minimize simple carbs. Increase exercise as tolerated. Continue current meds 

## 2020-09-25 NOTE — Assessment & Plan Note (Signed)
Using CPAP 

## 2020-09-25 NOTE — Assessment & Plan Note (Signed)
Encouraged increased hydration, topical rubs and to stay as active as possible

## 2020-09-25 NOTE — Assessment & Plan Note (Addendum)
Encouraged heart healthy diet, increase exercise, avoid trans fats, consider a krill oil cap daily. Tolerating Atorvastatin and Fenofibrate. He tried to hold Atorvastatin to see if it helped his myalgias but it did not.

## 2020-09-25 NOTE — Assessment & Plan Note (Signed)
Encouraged DASH or MIND diet, decrease po intake and increase exercise as tolerated. Needs 7-8 hours of sleep nightly. Avoid trans fats, eat small, frequent meals every 4-5 hours with lean proteins, complex carbs and healthy fats. Minimize simple carbs, GMO foods. 

## 2020-10-02 ENCOUNTER — Other Ambulatory Visit: Payer: Self-pay | Admitting: Cardiology

## 2020-10-02 DIAGNOSIS — R0609 Other forms of dyspnea: Secondary | ICD-10-CM

## 2020-10-14 ENCOUNTER — Encounter: Payer: Self-pay | Admitting: Family

## 2020-10-14 ENCOUNTER — Telehealth (INDEPENDENT_AMBULATORY_CARE_PROVIDER_SITE_OTHER): Payer: Medicare HMO | Admitting: Family

## 2020-10-14 VITALS — BP 144/73 | HR 80 | Ht 71.0 in | Wt 247.0 lb

## 2020-10-14 DIAGNOSIS — M791 Myalgia, unspecified site: Secondary | ICD-10-CM

## 2020-10-14 DIAGNOSIS — R059 Cough, unspecified: Secondary | ICD-10-CM | POA: Diagnosis not present

## 2020-10-14 DIAGNOSIS — Z20828 Contact with and (suspected) exposure to other viral communicable diseases: Secondary | ICD-10-CM

## 2020-10-14 MED ORDER — BENZONATATE 200 MG PO CAPS: 200.0000 mg | ORAL_CAPSULE | Freq: Three times a day (TID) | ORAL | 0 refills | Status: DC | PRN

## 2020-10-14 MED ORDER — AZITHROMYCIN 250 MG PO TABS: 250.0000 mg | ORAL_TABLET | Freq: Every day | ORAL | 0 refills | Status: DC

## 2020-10-14 NOTE — Progress Notes (Signed)
Virtual Visit via Video   I connected with patient on 10/14/20 at  1:40 PM EST by a video enabled telemedicine application and verified that I am speaking with the correct person using two identifiers.  Location patient: Home Location provider: [employer], Office Persons participating in the virtual visit: Patient, Provider, CMA  I discussed the limitations of evaluation and management by telemedicine and the availability of in person appointments. The patient expressed understanding and agreed to proceed.  Subjective:   HPI:   80 year old male is in with concerns of cough chest congestion, green-yellow phlegm, body aches after being exposed to influenza by his wife and daughter. Symptoms have been ongoing x 3 days.  He has been taking Tylenol to help    ROS:   See pertinent positives and negatives per HPI.  Patient Active Problem List   Diagnosis Date Noted  . Dysphagia 01/10/2020  . Lyme disease 05/04/2019  . Chronic renal failure, stage 3 (moderate) (HCC) 11/12/2018  . Chronic diastolic CHF (congestive heart failure) (HCC) 11/12/2018  . Overactive bladder 10/31/2018  . Acute diastolic CHF (congestive heart failure) (HCC)   . Chest pain 09/15/2018  . UTI (urinary tract infection) 08/01/2018  . Muscle cramps 06/13/2018  . Urinary incontinence 05/01/2018  . Anemia 11/07/2017  . Constipation 11/07/2017  . Lumbar stenosis with neurogenic claudication 10/22/2017  . Back pain 08/29/2017  . Right-sided low back pain with right-sided sciatica 11/22/2016  . Dyspnea 08/01/2016  . NSTEMI (non-ST elevated myocardial infarction) (HCC) 07/30/2016  . CAD in native artery   . Obesity   . Controlled diabetes mellitus type 2 with complications (HCC) 11/28/2015  . Lumbar radiculopathy 10/09/2015  . Neuropathy, diabetic (HCC) 05/22/2015  . H/O diverticulitis of colon 01/31/2015  . AAA (abdominal aortic aneurysm) (HCC) 01/01/2015  . Sinusitis 10/10/2014  . Right hip  pain 07/05/2014  . Palpitations 02/27/2014  . Claudication of lower extremity (HCC) 10/25/2013  . BPH with obstruction/lower urinary tract symptoms 10/25/2013  . Hypertriglyceridemia 08/15/2012  . Melanoma (HCC)   . Dizziness   . Renal insufficiency   . Coronary artery disease with exertional angina (HCC)   . Hyperlipidemia   . Essential hypertension   . OSA (obstructive sleep apnea)   . Ejection fraction   . SOB (shortness of breath)   . PARESTHESIA 05/30/2009  . EDEMA 01/22/2009  . OTHER ABNORMAL BLOOD CHEMISTRY 04/25/2008  . Gout 10/21/2007  . Depression 10/21/2007  . VENTRAL HERNIA 10/21/2007  . HIATAL HERNIA 10/21/2007  . FATIGUE 10/21/2007  . SKIN CANCER, HX OF 10/21/2007  . NEPHROLITHIASIS, HX OF 10/21/2007    Social History   Tobacco Use  . Smoking status: Former Smoker    Packs/day: 3.00    Years: 30.00    Pack years: 90.00    Types: Cigarettes    Quit date: 09/17/1996    Years since quitting: 24.0  . Smokeless tobacco: Never Used  Substance Use Topics  . Alcohol use: No    Current Outpatient Medications:  .  acetaminophen (TYLENOL) 500 MG tablet, Take 500 mg by mouth 2 (two) times daily. , Disp: , Rfl:  .  alfuzosin (UROXATRAL) 10 MG 24 hr tablet, Take 10 mg by mouth daily with breakfast., Disp: , Rfl:  .  allopurinol (ZYLOPRIM) 300 MG tablet, TAKE 1 TABLET EVERY DAY, Disp: 90 tablet, Rfl: 1 .  aspirin EC 81 MG tablet, Take 81 mg by mouth daily., Disp: , Rfl:  .  atorvastatin (LIPITOR)  40 MG tablet, TAKE 1 TABLET EVERY DAY, Disp: 90 tablet, Rfl: 1 .  azithromycin (ZITHROMAX Z-PAK) 250 MG tablet, Take 1 tablet (250 mg total) by mouth daily. 2 tabs today, then 1 tab daily x 4 more days., Disp: 6 tablet, Rfl: 0 .  B Complex Vitamins (B COMPLEX-B12 PO), Take 1 tablet by mouth daily. , Disp: , Rfl:  .  benzonatate (TESSALON) 200 MG capsule, Take 1 capsule (200 mg total) by mouth 3 (three) times daily as needed for cough., Disp: 15 capsule, Rfl: 0 .  Blood Glucose  Monitoring Suppl (TRUE METRIX AIR GLUCOSE METER) DEVI, Use to check blood sugar., Disp: 1 each, Rfl: 0 .  Chlorpheniramine Maleate (CHLOR-TRIMETON ALLERGY PO), Take 1 tablet by mouth every 6 (six) hours as needed (allergies)., Disp: , Rfl:  .  Cholecalciferol (VITAMIN D3) 50 MCG (2000 UT) capsule, Take 2,000 Units by mouth at bedtime. , Disp: , Rfl:  .  fenofibrate 160 MG tablet, TAKE 1 TABLET (160 MG TOTAL) BY MOUTH AT BEDTIME., Disp: 90 tablet, Rfl: 1 .  fluocinonide cream (LIDEX) [id number] %, Apply 1 application topically daily as needed (for rashes). , Disp: , Rfl:  .  fluticasone (FLONASE) 50 MCG/ACT nasal spray, Place 2 sprays into both nostrils daily. (Patient taking differently: Place 2 sprays into both nostrils daily as needed for allergies.), Disp: 16 g, Rfl: 1 .  furosemide (LASIX) 40 MG tablet, TAKE 2 TABLETS (80 MG TOTAL) BY MOUTH DAILY., Disp: 180 tablet, Rfl: 3 .  HYDROcodone-acetaminophen (NORCO) 5-325 MG tablet, Take 0.5-1 tablets by mouth at bedtime as needed for moderate pain., Disp: 30 tablet, Rfl: 0 .  insulin NPH Human (NOVOLIN N) 100 UNIT/ML injection, Inject 0.5 mLs (50 Units total) into the skin 2 (two) times daily before a meal., Disp: 10 mL, Rfl: 11 .  insulin regular (NOVOLIN R RELION) 100 units/mL injection, Inject 25-30 units 3 times a day before meals., Disp: 60 mL, Rfl: 1 .  Insulin Syringe-Needle U-100 (BD INSULIN SYRINGE U/F) 31G X 5/16" 0.3 ML MISC, Use to inject insulin 5 times a day., Disp: 500 each, Rfl: 11 .  isosorbide mononitrate (IMDUR) 30 MG 24 hr tablet, TAKE 1 TABLET (30 MG TOTAL) BY MOUTH DAILY., Disp: 90 tablet, Rfl: 3 .  MAGNESIUM PO, Take 1 tablet by mouth at bedtime., Disp: , Rfl:  .  Melatonin 10 MG TABS, Take 10 mg by mouth at bedtime., Disp: , Rfl:  .  metoprolol tartrate (LOPRESSOR) 25 MG tablet, Take 1 tablet (25 mg total) by mouth 2 (two) times daily. (Patient taking differently: Take 12.5 mg by mouth 2 (two) times daily.), Disp: 180 tablet, Rfl: 1 .   nitroGLYCERIN (NITROSTAT) 0.4 MG SL tablet, Place 1 tablet (0.4 mg total) under the tongue every 5 (five) minutes as needed for chest pain., Disp: 100 tablet, Rfl: 3 .  pantoprazole (PROTONIX) 20 MG tablet, Take 1 tablet (20 mg total) by mouth daily., Disp: 90 tablet, Rfl: 3 .  tiZANidine (ZANAFLEX) 4 MG tablet, Take 1 tablet (4 mg total) by mouth at bedtime as needed for muscle spasms., Disp: 30 tablet, Rfl: 5 .  TRUE METRIX BLOOD GLUCOSE TEST test strip, TEST BLOOD SUGAR THREE TIMES DAILY AS DIRECTED, Disp: 300 strip, Rfl: 11 .  TRUEplus Lancets 33G MISC, Use to check blood sugar 3 times a day (Patient taking differently: 1 each by Other route in the morning, at noon, and at bedtime. Use to check blood sugar 3 times a day),  Disp: 300 each, Rfl: 11 .  Probiotic Product (PROBIOTIC PO), Take 1 capsule by mouth at bedtime. , Disp: , Rfl:   Allergies  Allergen Reactions  . Brilinta [Ticagrelor] Shortness Of Breath  . Insulin Detemir Swelling, Other (See Comments) and Itching    Patient had redness and swelling and tenderness at injection site. Patient had redness and swelling and tenderness at injection site.  . Bee Venom   . Codeine Other (See Comments)    Makes him "shakey", is OK with hydrocodone GI UPSET & TREMORS Makes him "shakey", is OK with hydrocodone Makes him "shakey", is OK with hydrocodone GI UPSET & TREMORS  . Lipitor [Atorvastatin] Other (See Comments)    Muscle aches; affects liver function- Patient is currently taking  . Novolog Mix [Insulin Aspart Prot & Aspart] Other (See Comments)    Causes skin to swell/itch at injection site    Objective:   BP (!) 144/73 (BP Location: Left Arm, Patient Position: Sitting, Cuff Size: Normal)   Pulse 80   Ht 5\' 11"  (1.803 m)   Wt 247 lb (112 kg)   SpO2 97%   BMI 34.45 kg/m   Patient is well-developed, well-nourished in no acute distress.  Resting comfortably at home.  Head is normocephalic, atraumatic.  No labored breathing.   Speech is clear and coherent with logical content.  Patient is alert and oriented at baseline.    Assessment and Plan:   Chrles was seen today for acute visit.  Diagnoses and all orders for this visit:  Cough  Exposure to influenza  Myalgia  Other orders -     benzonatate (TESSALON) 200 MG capsule; Take 1 capsule (200 mg total) by mouth 3 (three) times daily as needed for cough. -     azithromycin (ZITHROMAX Z-PAK) 250 MG tablet; Take 1 tablet (250 mg total) by mouth daily. 2 tabs today, then 1 tab daily x 4 more days.   Tamiflu not prescribed. Antibiotic prescribed empirically. Call the office with any questions or concerns.   Kennyth Arnold, FNP 10/14/2020

## 2020-10-15 ENCOUNTER — Telehealth: Payer: Medicare HMO | Admitting: Medical

## 2020-10-23 ENCOUNTER — Other Ambulatory Visit: Payer: Self-pay | Admitting: Family Medicine

## 2020-10-24 DIAGNOSIS — Z961 Presence of intraocular lens: Secondary | ICD-10-CM | POA: Diagnosis not present

## 2020-10-24 DIAGNOSIS — E119 Type 2 diabetes mellitus without complications: Secondary | ICD-10-CM | POA: Diagnosis not present

## 2020-10-24 DIAGNOSIS — Z794 Long term (current) use of insulin: Secondary | ICD-10-CM | POA: Diagnosis not present

## 2020-10-24 DIAGNOSIS — H524 Presbyopia: Secondary | ICD-10-CM | POA: Diagnosis not present

## 2020-11-05 ENCOUNTER — Other Ambulatory Visit: Payer: Self-pay | Admitting: Family Medicine

## 2020-11-05 ENCOUNTER — Telehealth: Payer: Self-pay | Admitting: Family Medicine

## 2020-11-05 MED ORDER — HYDROCODONE-ACETAMINOPHEN 5-325 MG PO TABS: 0.5000 | ORAL_TABLET | Freq: Every evening | ORAL | 0 refills | Status: DC | PRN

## 2020-11-05 NOTE — Telephone Encounter (Signed)
Medication: HYDROcodone-acetaminophen (NORCO) 5-325 MG tablet [[id number]    Has the patient contacted their pharmacy?  NO (If no, request that the patient contact the pharmacy for the refill.) (If yes, when and what did the pharmacy advise?)    Preferred Pharmacy (with phone number or street name) [place], [place] Phone:  [phone]  Fax:  [phone]        Agent: Please be advised that RX refills may take up to 3 business days. We ask that you follow-up with your pharmacy.

## 2020-11-05 NOTE — Telephone Encounter (Signed)
Patient's spouse states he only received 7 pills of the following medication in December 2021 : HYDROcodone-acetaminophen (NORCO) 5-325 MG tablet [[id number].   Patient's spouse states she will call the pharmacy.

## 2020-11-05 NOTE — Telephone Encounter (Signed)
Requesting: hydrocodone 5-325mg  Contract: None UDS: None Last Visit: 09/24/2020 Next Visit:  04/01/2021 Last Refill: 09/24/2020 #30 and 0RF  Please Advise

## 2020-11-08 ENCOUNTER — Encounter: Payer: Self-pay | Admitting: Physician Assistant

## 2020-11-08 ENCOUNTER — Other Ambulatory Visit: Payer: Self-pay

## 2020-11-08 ENCOUNTER — Telehealth (INDEPENDENT_AMBULATORY_CARE_PROVIDER_SITE_OTHER): Payer: Medicare HMO | Admitting: Physician Assistant

## 2020-11-08 DIAGNOSIS — R051 Acute cough: Secondary | ICD-10-CM | POA: Diagnosis not present

## 2020-11-08 DIAGNOSIS — Z87891 Personal history of nicotine dependence: Secondary | ICD-10-CM | POA: Diagnosis not present

## 2020-11-08 MED ORDER — CEFUROXIME AXETIL 250 MG PO TABS: 500.0000 mg | ORAL_TABLET | Freq: Two times a day (BID) | ORAL | Status: DC

## 2020-11-08 NOTE — Patient Instructions (Signed)
Patient is going to take an at home Covid test today that his daughter is bringing him and will call us back with results.  Monitor weight, blood pressure, heart rate, and oxygen saturation closely.  Go to the emergency department for any sudden changes.  Please call on Monday with an update for Dr. Charlett Blake.

## 2020-11-08 NOTE — Progress Notes (Signed)
Virtual Visit via Telephone Note  I connected with William Hendrix on 11/08/20 at  2:00 PM EST by telephone and verified that I am speaking with the correct person using two identifiers.  Location: Patient: home Provider: office   I discussed the limitations, risks, security and privacy concerns of performing an evaluation and management service by telephone and the availability of in person appointments. I also discussed with the patient that there may be a patient responsible charge related to this service. The patient expressed understanding and agreed to proceed.   History of Present Illness:  Pleasant 80 yo male presents today for acute cough since last night via telephone visit. Feels SOB, croupy cough into chest, started around 11 pm last night. States he does not normally have a cough at all. Former smoker, no history of COPD per patient. He is diabetic, but sugars have been stable for him. He also has a history of CHF and monitors his weight and takes his Lasix daily.   Taste and smell is not normal per patient, and less appetite than normal. He does have some sore throat. Very runny nose per patient. Denies any fever or any unusual fatigue.   He uses a CPAP at night. He is taking OTC Guaifenesin.   States he had the flu just a few weeks ago and was able to get over it.  He did have his flu vaccine. He had both [place] vaccines and a booster.    Observations/Objective:  He sounds very congested and has harsh coughing fits. He is able to speak in full sentences without pauses or gasping for air when he is not in these coughing spells. He is fully alert and oriented and able to provide his own history. His wife is present on the phone as well.  Vitals this morning per patient report: BP 151/75 HR 75 bpm SpO2% 98  Current vitals with patient while on the phone:  BP 135/72  HR 74 bpm SpO2% 96   Assessment and Plan:  1.  Acute cough with intermittent shortness of  breath during coughing fits.  Discussed with patient and his wife that he may have COVID-19 with the sudden onset of his symptoms.  His daughter is coming today with an at home test and they will call us back with the results of that test.  He has a complicated history.  His weight has been stable and he does not note any new swelling, therefore my thoughts of this being CHF exacerbation is lower than acute respiratory illness.  However, patient and his wife are going to closely monitor his oxygen saturation, blood pressure and heart rate, and weight.  If he were to have sudden changes in any of his vital signs he will go to the emergency department this weekend.  Again advised a very low threshold for him to seek urgent care should he worsen.  2.  History of smoking.  He has not smoked since December 1997 but has a 90-pack-year history.  Again this history still does put him at higher risk for respiratory illness.  I will start him on cefuroxime 500 mg twice daily.  Follow Up Instructions:   I discussed the assessment and treatment plan with the patient. The patient was provided an opportunity to ask questions and all were answered. The patient agreed with the plan and demonstrated an understanding of the instructions.   The patient was advised to call back or seek an in-person evaluation if the symptoms  worsen or if the condition fails to improve as anticipated.  I provided 25 minutes of non-face-to-face time during this encounter.   Alaynah Schutter M Masen Salvas, PA-C

## 2020-11-11 ENCOUNTER — Telehealth: Payer: Self-pay

## 2020-11-11 ENCOUNTER — Telehealth: Payer: Self-pay | Admitting: Family Medicine

## 2020-11-11 ENCOUNTER — Other Ambulatory Visit: Payer: Self-pay | Admitting: Family Medicine

## 2020-11-11 ENCOUNTER — Other Ambulatory Visit: Payer: Self-pay | Admitting: Family

## 2020-11-11 MED ORDER — PROMETHAZINE-CODEINE 6.25-10 MG/5ML PO SOLN: 5.0000 mL | Freq: Three times a day (TID) | ORAL | 0 refills | Status: DC | PRN

## 2020-11-11 MED ORDER — CEFUROXIME AXETIL 500 MG PO TABS: 500.0000 mg | ORAL_TABLET | Freq: Two times a day (BID) | ORAL | 0 refills | Status: DC

## 2020-11-11 NOTE — Telephone Encounter (Signed)
I called and talked with patient's wife. She was upset that when she calls the office there's always a lot of background noise and it's very hard for them to hear. She said that when she called the office on Friday it sounded like they were having a party in the reception area.  She was also upset that her husband's medication had not been called in over the weekend. He is feeling much better now and no longer has a temperature. His oxygen saturation was 99 while I was on the phone with them. They were offered post Covid clinic and declined.  Wife is also upset about a prescription that was called in for back pain. He was supposed to receive 30 pills and due to restrictions only received seven. Patient's wife said that they should've been told that to begin with and felt that as elderly patients they were "getting the run around".   Patient's wife did share with me that while the majority of his symptoms have resolved he still has a terrible cough. She had some old hydrocodone cough syrup that she gave him to help him get some rest. She would like to know if some more could be called in for him because she only had 2.5 ml on hand to give him. States she just gave it to him at night.

## 2020-11-11 NOTE — Telephone Encounter (Signed)
Patient seen virtually on 11/08/2020 by Allwardt, Alyassa PA-C. Per patient's spouse patient , his pharmacy never received the prescription prescribed to him . cefUROXime (CEFTIN) tablet 500 mg  [[id number]]   Please advise

## 2020-11-11 NOTE — Telephone Encounter (Signed)
Looks like patient has COVID and was directed to ED but did not go? See how he is doing and set him up with [facility] if he is still needing help but unwilling to go to ED. I have no ability to see him today. Sorry. Can talk to him later in the week if he is doing OK

## 2020-11-11 NOTE — Telephone Encounter (Signed)
I reviewed William Hendrix chart and William Hendrix's note from 11/11/20.  I see that she placed the order but it was entered as a clinic administered medication in error instead of electronic transmission to pharmacy. I have sent rx to William Hendrix's local pharmacy.   However, I see that he has low oxygen levels today and new temperature/cough. Given his age and other health issues, I think he should have an in person visit at an urgent care today.

## 2020-11-11 NOTE — Telephone Encounter (Signed)
Nurse Assessment Nurse: Ferdinand Lango, RN, Kenney Houseman Date/Time (Eastern Time): 11/08/2020 5:44:45 PM Confirm and document reason for call. If symptomatic, describe symptoms. ---Caller states pt s/s began last night with fever, sore throat, and cough is non-productive. Pt temperature 101 30 min ago orally. O2 90-91% on r/a. Denies any other symptoms at this time. Does the patient have any new or worsening symptoms? ---Yes Will a triage be completed? ---Yes Related visit to physician within the last 2 weeks? ---Yes Does the PT have any chronic conditions? (i.e. diabetes, asthma, this includes High risk factors for pregnancy, etc.) ---Yes List chronic conditions. ---DM, 5x Stents d/t cardiac disease, 3rd stage kidney disease, CPAP Is this a behavioral health or substance abuse call? ---No Guidelines Guideline Title Affirmed Question Affirmed Notes Nurse Date/Time ([place] Time) COVID-19 - Diagnosed or Suspected MODERATE difficulty breathing (e.g., speaks in phrases, SOB even at rest, pulse 100-120) Ferdinand Lango, RN, [facility] 11/08/2020 5:47:31 PM Disp. Time Eilene Ghazi Time) Disposition Final User 11/08/2020 5:52:44 PM Go to ED Now Yes Ferdinand Lango, RN, Kenney Houseman PLEASE NOTE: All timestamps contained within this report are represented as [place] Standard Time. CONFIDENTIALTY NOTICE: This fax transmission is intended only for the addressee. It contains information that is legally privileged, confidential or otherwise protected from use or disclosure. If you are not the intended recipient, you are strictly prohibited from reviewing, disclosing, copying using or disseminating any of this information or taking any action in reliance on or regarding this information. If you have received this fax in error, please notify us immediately by telephone so that we can arrange for its return to [place]. Phone: [phone], Toll-Free: [phone], Fax: [phone] Page: 2 of 2 Call Id: [id number] Domino Disagree/Comply Comply Caller  Understands Yes PreDisposition Call Doctor Care Advice Given Per Guideline GO TO ED NOW: * You need to be seen in the Emergency Department. * Go to the ED at ___________ [place] now. Drive carefully. ANOTHER ADULT SHOULD DRIVE: * It is better and safer if another adult drives instead of you. WEAR A MASK - COVER YOUR MOUTH AND NOSE: YOU SHOULD TELL HEALTHCARE PERSONNEL THAT YOU MIGHT HAVE COVID-19: * Tell the first healthcare worker you meet that you may have COVID-19. CALL EMS 911 IF: * Severe difficulty breathing occurs * Lips or face turns blue * Confusion occurs. CARE ADVICE given per COVID-19 - DIAGNOSED OR SUSPECTED (Adult) guideline. Comments User: Rayne Du, RN Date/Time Eilene Ghazi Time): 11/08/2020 5:49:36 PM Positive for COVID Referrals [facility] - ED

## 2020-11-11 NOTE — Telephone Encounter (Signed)
I have sent in some promethazine with codeine for [place] cough as the hydrocodone liquids are on back order. That should helps

## 2020-11-11 NOTE — Telephone Encounter (Signed)
Calls and spoke with the daughter who shared some frustrations. She said she was calling because when her mother asked to speak to the manager she was not able to get a message sent to anyone. When  the daughter called to try and get through to a supervisor or manager she was told we did not have a voicemail that she could leave a message with. She asked me to call her mom to get additional information.

## 2020-11-11 NOTE — Progress Notes (Unsigned)
cefu

## 2020-11-11 NOTE — Telephone Encounter (Signed)
Caller: William Hendrix ( Daughter) [facility] power of attorney Call back # [phone]  [name] would like to speak to someone in regards to how her parent was treated over the phone. (would not give me any other information)

## 2020-11-12 ENCOUNTER — Other Ambulatory Visit: Payer: Self-pay | Admitting: Family Medicine

## 2020-11-12 MED ORDER — PROMETHAZINE-DM 6.25-15 MG/5ML PO SYRP: 5.0000 mL | ORAL_SOLUTION | Freq: Four times a day (QID) | ORAL | 0 refills | Status: DC | PRN

## 2020-11-12 NOTE — Telephone Encounter (Signed)
Per Estill Bamberg she spoke with patient and it was taking care but she sent something to Dr. Charlett Blake.

## 2020-11-12 NOTE — Chronic Care Management (AMB) (Signed)
Chronic Care Management Pharmacy  Name: RYLYN RANGANATHAN  MRN: [id number] DOB: [birth year]  Chief Complaint/ HPI  William Hendrix,  80 y.o. , male presents for their Follow-Up CCM visit with the clinical pharmacist via telephone due to COVID-19 Pandemic.  PCP : [name], MD  Their chronic conditions include: Hypertension, Hyperlipidemia/CAD, Diabetes, GERD, Depression, Insomnia, BPH, Gout, Lumbar Radiculopathy  Office Visits: 09/24/20 Charlett Blake) - general follow up.  No med changes, previously tried to hold atorvastatin to see if this helped with myalgias, however, it did not so it was continued.  11/24/19: Visit w/ Dr. Charlett Blake - No med changes noted.   Consult Visit: 09/10/20 Jaclyn Prime, Gher[place]he) - No changed to insulin regimen  04/12/20: Endo visit w/ Dr. Renne Crigler - Regular insulin 25-35 units before meals (for dinner use 15 units if BG <100), NPH 50 units before breakfast and 50 units before dinner.   03/21/20: Urology visit w/ Dr. Lovena Neighbours  03/06/20: Cardio visit w/ Dr. Stanford Breed - No med changes noted.  03/01/20: Neuro visit w/ Dr. Ellene Route  Medications: Outpatient Encounter Medications as of 11/13/2020  Medication Sig Note   acetaminophen (TYLENOL) 500 MG tablet Take 500 mg by mouth 2 (two) times daily.     alfuzosin (UROXATRAL) 10 MG 24 hr tablet Take 10 mg by mouth daily with breakfast.    allopurinol (ZYLOPRIM) 300 MG tablet TAKE 1 TABLET EVERY DAY    aspirin EC 81 MG tablet Take 81 mg by mouth daily. 03/18/2020: Last dose was 03/14/20   atorvastatin (LIPITOR) 40 MG tablet TAKE 1 TABLET EVERY DAY    azithromycin (ZITHROMAX Z-PAK) 250 MG tablet Take 1 tablet (250 mg total) by mouth daily. 2 tabs today, then 1 tab daily x 4 more days.    B Complex Vitamins (B COMPLEX-B12 PO) Take 1 tablet by mouth daily.     benzonatate (TESSALON) 200 MG capsule Take 1 capsule (200 mg total) by mouth 3 (three) times daily as needed for cough.    Blood Glucose Monitoring Suppl (TRUE METRIX AIR  GLUCOSE METER) DEVI Use to check blood sugar.    cefUROXime (CEFTIN) 500 MG tablet Take 1 tablet (500 mg total) by mouth 2 (two) times daily with a meal.    Chlorpheniramine Maleate (CHLOR-TRIMETON ALLERGY PO) Take 1 tablet by mouth every 6 (six) hours as needed (allergies).    Cholecalciferol (VITAMIN D3) 50 MCG (2000 UT) capsule Take 2,000 Units by mouth at bedtime.     fenofibrate 160 MG tablet TAKE 1 TABLET (160 MG TOTAL) BY MOUTH AT BEDTIME.    fluocinonide cream (LIDEX) 4.26 % Apply 1 application topically daily as needed (for rashes).     fluticasone (FLONASE) 50 MCG/ACT nasal spray Place 2 sprays into both nostrils daily. (Patient taking differently: Place 2 sprays into both nostrils daily as needed for allergies.) 08/13/2020: Uses once every 2-3 months; symptoms from CPAP   furosemide (LASIX) 40 MG tablet TAKE 2 TABLETS (80 MG TOTAL) BY MOUTH DAILY.    HYDROcodone-acetaminophen (NORCO) 5-325 MG tablet Take 0.5-1 tablets by mouth at bedtime as needed for moderate pain.    insulin NPH Human (NOVOLIN N) 100 UNIT/ML injection Inject 0.5 mLs (50 Units total) into the skin 2 (two) times daily before a meal.    insulin regular (NOVOLIN R RELION) 100 units/mL injection Inject 25-30 units 3 times a day before meals.    Insulin Syringe-Needle U-100 (BD INSULIN SYRINGE U/F) 31G X 5/16" 0.3 ML MISC Use to inject insulin  5 times a day.    isosorbide mononitrate (IMDUR) 30 MG 24 hr tablet TAKE 1 TABLET (30 MG TOTAL) BY MOUTH DAILY.    MAGNESIUM PO Take 1 tablet by mouth at bedtime.    Melatonin 10 MG TABS Take 10 mg by mouth at bedtime.    metoprolol tartrate (LOPRESSOR) 25 MG tablet Take 1 tablet (25 mg total) by mouth 2 (two) times daily. (Patient taking differently: Take 12.5 mg by mouth 2 (two) times daily.)    nitroGLYCERIN (NITROSTAT) 0.4 MG SL tablet Place 1 tablet (0.4 mg total) under the tongue every 5 (five) minutes as needed for chest pain. 05/10/2019: Has on hand   pantoprazole  (PROTONIX) 20 MG tablet Take 1 tablet (20 mg total) by mouth daily.    Probiotic Product (PROBIOTIC PO) Take 1 capsule by mouth at bedtime.     promethazine-dextromethorphan (PROMETHAZINE-DM) 6.25-15 MG/5ML syrup Take 5 mLs by mouth 4 (four) times daily as needed for cough.    tiZANidine (ZANAFLEX) 4 MG tablet Take 1 tablet (4 mg total) by mouth at bedtime as needed for muscle spasms.    TRUE METRIX BLOOD GLUCOSE TEST test strip TEST BLOOD SUGAR THREE TIMES DAILY AS DIRECTED    TRUEplus Lancets 33G MISC Use to check blood sugar 3 times a day (Patient taking differently: 1 each by Other route in the morning, at noon, and at bedtime. Use to check blood sugar 3 times a day)    No facility-administered encounter medications on file as of 11/13/2020.     Current Diagnosis/Assessment:  Goals Addressed            This Visit's Progress    Chronic Care Management Pharmacy Care Plan       CARE PLAN ENTRY (see longitudinal plan of care for additional care plan information)  Current Barriers:   Chronic Disease Management support, education, and care coordination needs related to Hypertension, Hyperlipidemia/CAD, Diabetes, GERD, Depression, Insomnia, BPH, Gout, Lumbar Radiculopathy   Hypertension BP Readings from Last 3 Encounters:  10/14/20 (!) 144/73  09/24/20 122/80  09/10/20 124/78    Pharmacist Clinical Goal(s): o Over the next 90 days, patient will work with PharmD and providers to maintain BP goal <130/80  Current regimen:  o Metoprolol Tartrate 50mg  1 tab twice daily (taking 1/2 tab twice daily)  Interventions: o Discussed BP goal o Recommended continued monitoring  Patient self care activities - Over the next 90 days, patient will: o Check BP 3-5 times per week, document, and provide at future appointments o Ensure daily salt intake < 2300 mg/day  Hyperlipidemia Lab Results  Component Value Date/Time   LDLCALC 64 08/28/2020 11:23 AM   LDLDIRECT 57.0 11/21/2019  08:24 AM    Pharmacist Clinical Goal(s): o Over the next 90 days, patient will work with PharmD and providers to maintain LDL goal < 70, and achieve Triglyceride goal < 150  Current regimen:   Atorvastatin 40mg  daily  Fenofibrate 160mg  daily  Aspirin 81mg  daily  Isosorbide Mono 30mg  daily  Interventions: o Discussed LDL and triglyceride goal o Discussed dietary modifications to lower TG's  Patient self care activities - Over the next 90 days, patient will: o Maintain cholesterol medication regimen.  o Work on less saturated fats and fried foods.  Diabetes Lab Results  Component Value Date/Time   HGBA1C 6.2 (A) 09/10/2020 03:08 PM   HGBA1C 6.7 (A) 04/30/2020 03:29 PM   HGBA1C 8.0 (H) 11/21/2019 08:24 AM   HGBA1C 7.2 (H) 08/14/2019 08:12 AM  Pharmacist Clinical Goal(s): o Over the next 90 days, patient will work with PharmD and providers to maintain A1c goal <7%  Current regimen:   Novolin N 50 units AM, 50 units HS  Novolin R 30 units AM, 30 units Lunch, 30 units HS  Interventions: o Discussed DM goals o Reviewed fasting home glucose.  Patient self care activities - Over the next 90 days, patient will: o Check blood sugar 3-4 times daily, document, and provide at future appointments o Contact provider with any episodes of hypoglycemia  Lumbar Radiculopathy  Pharmacist Clinical Goal(s) o Over the next 90 days, patient will work with PharmD and providers to reduce symptoms associated with lumbar radiculopathy  Current regimen:  o Hydrocodone/APAP 10/353m 1/2 tab twice daily  Interventions: o Updated pain management regimen o Evaluated for pain control  Patient self care activities - Over the next 90 days, patient will: o Maintain pain medication regimen o Notify providers if symptoms change or worsen  Medication management  Pharmacist Clinical Goal(s): o Over the next 90 days, patient will work with PharmD and providers to maintain optimal  medication adherence  [facility]  Interventions o Comprehensive medication review performed. o Continue current medication management strategy  Patient self care activities - Over the next 90 days, patient will: o Focus on medication adherence by filling and taking medications appropriately  o Take medications as prescribed o Report any questions or concerns to PharmD and/or provider(s)  Please see past updates related to this goal by clicking on the "Past Updates" button in the selected goal        Social Hx:  Wife on call Previously could paint, climb ladders, etc.  Upset he can't walk and function as much as before. "ice picks poking in my hips" [phone] sister [place]Sister [phone] sister is [phone] 13 siblings but 5 remaining  Hypertension   BP goal is:  <130/80  Office blood pressures are  BP Readings from Last 3 Encounters:  10/14/20 (!) 144/73  09/24/20 122/80  09/10/20 124/78   Patient checks BP at home 3-5x per week Patient home BP readings are ranging:  140 64 60 137 65 57 137 70 63 134 68 60 129 71 59 140 69 54 132 67 63 Avg 135.5 67.7 59.4   Patient has failed these meds in the past: None noted  Patient is currently controlled on the following medications:   Metoprolol Tartrate 553m1/2 tab twice daily  Needs updated script  We discussed BP goal  Update 11/13/20 No specific readings to provide he was not at home Reports it has been "good" Denies any dizziness or headaches, reports adherence with medication  Plan -Continue current medications     Hyperlipidemia/CAD   LDL goal < 70  TRIG goal < 150   Last lipids Lab Results  Component Value Date   CHOL 128 08/28/2020   HDL 25 (L) 08/28/2020   LDLCALC 64 08/28/2020   LDLDIRECT 57.0 11/21/2019   TRIG 240 (H) 08/28/2020   CHOLHDL 5.1 (H) 08/28/2020   Hepatic Function Latest Ref Rng & Units 08/28/2020  01/09/2020 11/21/2019  Total Protein 6.0 - 8.5 g/dL 7.1 6.5 7.0  Albumin 3.7 - 4.7 g/dL 4.5 3.8 4.2  AST 0 - 40 IU/L _0 ALT 0 - 44 IU/L _1 Alk Phosphatase 44 - 121 IU/L 62 51 61  Total Bilirubin  0.0 - 1.2 mg/dL 0.4 0.5 0.6  Bilirubin, Direct 0.0 - 0.3 mg/dL - - -     The ASCVD Risk score [name], et al., 2013) failed to calculate for the following reasons:   The 2013 ASCVD risk score is only valid for ages 56 to 8   The patient has a prior MI or stroke diagnosis   Patient has failed these meds in past: None noted  Patient is currently controlled, except for TRIG on the following medications:   Atorvastatin 40mg  daily AM  Fenofibrate 160mg  daily HS  Aspirin 81mg  daily AM  Isosorbide Mono 30mg  daily AM  Diet Broiled fish he catches himself Steak twice a week and he isn't giving it up  We discussed:  LDL and TRIG goal  Update 11/13/20 Reviewed most recent lipid panel Discussed dietay mods to lower TGs Reinforced adherence with medication  Plan -Continue current medications  Diabetes   Followed by Endo (Dr. Cruzita Lederer)  A1c goal <7%  FBG goal 80-130  Recent Relevant Labs: Lab Results  Component Value Date/Time   HGBA1C 6.2 (A) 09/10/2020 03:08 PM   HGBA1C 6.7 (A) 04/30/2020 03:29 PM   HGBA1C 8.0 (H) 11/21/2019 08:24 AM   HGBA1C 7.2 (H) 08/14/2019 08:12 AM   GFR 43.72 (L) 11/21/2019 08:24 AM   GFR 48.83 (L) 08/14/2019 08:12 AM   MICROALBUR 0.7 08/02/2014 10:49 AM   MICROALBUR 0.5 02/23/2013 02:32 PM    Last diabetic Eye exam:  Lab Results  Component Value Date/Time   HMDIABEYEEXA No Retinopathy 09/30/2015 12:00 AM    Last diabetic Foot exam: No results found for: HMDIABFOOTEX   Checking BG: 3x per Day  Patient has failed these meds in past: metformin (CKD), Januvia (cost), Levemir (skin reaction) Patient is currently controlled on the following medications:  Novolin N 50 units AM, 50 units HS  Novolin R 30 units AM, 30 units Lunch, 30  units HS  We discussed: DM goals  Update 11/13/20 He denies any hypoglycemia Reports most of his fasting sugars are 95-130 No specific logs as he was not at home  Plan -Continue current medications      BPH   Followed by Urology Harrell Gave [facility])  PSA  Date Value Ref Range Status  11/02/2018 1.01 0.10 - 4.00 ng/mL Final    Comment:    Test performed using Access Hybritech PSA Assay, a parmagnetic partical, chemiluminecent immunoassay.     Patient has failed these meds in past: tamsulosin (change in therapy?) Patient is currently controlled on the following medications:   Alfuzosin 10mg  daily AM    Plan -Continue current medications   Gout   Uric Acid, Serum  Date Value Ref Range Status  12/15/2018 4.3 4.0 - 7.8 mg/dL Final  05/13/2017 4.7 4.0 - 7.8 mg/dL Final  08/02/2014 4.0 4.0 - 7.8 mg/dL Final     Goal Uric Acid < 6 mg/dL   Medications that may increase uric acid levels: Loop Diuretics  and Aspirin  Last gout flare: Can't recall last flare  Patient has failed these meds in past: None noted  Patient is currently controlled on the following medications:   Allopurinol 300mg  daily  Plan -Continue current medications   Lumbar Radiculopathy   Patient has failed these meds in past: None noted  Patient is currently uncontrolled on the following medications:  Hydrocodone/APAP 10/325mg  1/2 tab twice daily (no longer able to get refill from neurosurgeon)  Tizanidine 4mg  hs prn  Feels good other than his back  Surgeon: Dr. Ellene Route (was told he can't get more pain med due to being addicted) Was told there is nothing further that can be done for him surgically Can't even walk to mail box.  Last back surgery May 2021 Takes 1/2 tab at night to sleep  Would not like to see more doctors. Would not like to see pain management No hx of seizures  Consider tramadol?  We discussed:  Med options for pain  Update 11/13/20 Now taking tizanidine at night  also Dr. Charlett Blake is now prescribing his Norco Pain is manageable but he still hurts throughout the day  Plan -Follow up with Dr. Charlett Blake to discuss pain med options   Vaccines   Reviewed and discussed patient's vaccination history.    Immunization History  Administered Date(s) Administered   Fluad Quad(high Dose 65+) 06/15/2019   Influenza Split 08/08/2012   Influenza Whole 08/08/2009, 07/12/2010   Influenza, High Dose Seasonal PF 08/02/2014, 07/11/2015, 08/26/2017, 08/01/2018, 08/11/2020   Influenza,inj,Quad PF,6+ Mos 08/05/2011, 07/27/2013, 08/04/2016   Pneumococcal Conjugate-13 08/02/2014   Pneumococcal Polysaccharide-23 07/28/2005   Unspecified SARS-COV-2 Vaccination 11/23/2019, 12/18/2019   Received covid vaccines 11/23/19 and 12/18/19  Received flu shot 08/11/20 -Had allergic reaction to pneumonia vaccine  Plan Vaccine record up to date  Medication Management   Pt uses [facility] for all medications  Miscellaneous Meds Acetaminophen   We discussed: Current pharmacy is preferred with insurance plan and patient is satisfied with pharmacy services  Plan  Continue current medication management strategy   Follow up: 3 month phone visit  Beverly Milch, PharmD Clinical Pharmacist [place] [phone]

## 2020-11-12 NOTE — Telephone Encounter (Signed)
LVM letting pt know that a script was sent in for him.

## 2020-11-12 NOTE — Progress Notes (Unsigned)
Please advise on refill. Escribe failed first attempt.

## 2020-11-12 NOTE — Progress Notes (Signed)
Sent in Promethazine DM since the other did not go thru.

## 2020-11-13 ENCOUNTER — Ambulatory Visit (INDEPENDENT_AMBULATORY_CARE_PROVIDER_SITE_OTHER): Payer: Medicare HMO

## 2020-11-13 DIAGNOSIS — E781 Pure hyperglyceridemia: Secondary | ICD-10-CM

## 2020-11-13 DIAGNOSIS — I10 Essential (primary) hypertension: Secondary | ICD-10-CM

## 2020-11-13 DIAGNOSIS — Z794 Long term (current) use of insulin: Secondary | ICD-10-CM

## 2020-11-13 DIAGNOSIS — E118 Type 2 diabetes mellitus with unspecified complications: Secondary | ICD-10-CM | POA: Diagnosis not present

## 2020-11-13 DIAGNOSIS — M5416 Radiculopathy, lumbar region: Secondary | ICD-10-CM

## 2020-11-13 NOTE — Patient Instructions (Addendum)
Visit Information  Goals Addressed            This Visit's Progress   . Chronic Care Management Pharmacy Care Plan       CARE PLAN ENTRY (see longitudinal plan of care for additional care plan information)  Current Barriers:  . Chronic Disease Management support, education, and care coordination needs related to Hypertension, Hyperlipidemia/CAD, Diabetes, GERD, Depression, Insomnia, BPH, Gout, Lumbar Radiculopathy   Hypertension BP Readings from Last 3 Encounters:  10/14/20 (!) 144/73  09/24/20 122/80  09/10/20 124/78   . Pharmacist Clinical Goal(s): o Over the next 90 days, patient will work with PharmD and providers to maintain BP goal <130/80 . Current regimen:  o Metoprolol Tartrate 50mg  1 tab twice daily (taking 1/2 tab twice daily) . Interventions: o Discussed BP goal o Recommended continued monitoring . Patient self care activities - Over the next 90 days, patient will: o Check BP 3-5 times per week, document, and provide at future appointments o Ensure daily salt intake < 2300 mg/day  Hyperlipidemia Lab Results  Component Value Date/Time   LDLCALC 64 08/28/2020 11:23 AM   LDLDIRECT 57.0 11/21/2019 08:24 AM   . Pharmacist Clinical Goal(s): o Over the next 90 days, patient will work with PharmD and providers to maintain LDL goal < 70, and achieve Triglyceride goal < 150 . Current regimen:  . Atorvastatin 40mg  daily . Fenofibrate 160mg  daily . Aspirin 81mg  daily . Isosorbide Mono 30mg  daily . Interventions: o Discussed LDL and triglyceride goal o Discussed dietary modifications to lower TG's . Patient self care activities - Over the next 90 days, patient will: o Maintain cholesterol medication regimen.  o Work on less saturated fats and fried foods.  Diabetes Lab Results  Component Value Date/Time   HGBA1C 6.2 (A) 09/10/2020 03:08 PM   HGBA1C 6.7 (A) 04/30/2020 03:29 PM   HGBA1C 8.0 (H) 11/21/2019 08:24 AM   HGBA1C 7.2 (H) 08/14/2019 08:12 AM    . Pharmacist Clinical Goal(s): o Over the next 90 days, patient will work with PharmD and providers to maintain A1c goal <7% . Current regimen:  . Novolin N 50 units AM, 50 units HS . Novolin R 30 units AM, 30 units Lunch, 30 units HS . Interventions: o Discussed DM goals o Reviewed fasting home glucose. . Patient self care activities - Over the next 90 days, patient will: o Check blood sugar 3-4 times daily, document, and provide at future appointments o Contact provider with any episodes of hypoglycemia  Lumbar Radiculopathy . Pharmacist Clinical Goal(s) o Over the next 90 days, patient will work with PharmD and providers to reduce symptoms associated with lumbar radiculopathy . Current regimen:  o Hydrocodone/APAP 10/325mg  1/2 tab twice daily . Interventions: o Updated pain management regimen o Evaluated for pain control . Patient self care activities - Over the next 90 days, patient will: o Maintain pain medication regimen o Notify providers if symptoms change or worsen  Medication management . Pharmacist Clinical Goal(s): o Over the next 90 days, patient will work with PharmD and providers to maintain optimal medication adherence . Current pharmacy: [facility] . Interventions o Comprehensive medication review performed. o Continue current medication management strategy . Patient self care activities - Over the next 90 days, patient will: o Focus on medication adherence by filling and taking medications appropriately  o Take medications as prescribed o Report any questions or concerns to PharmD and/or provider(s)  Please see past updates related to this goal by clicking  on the "Past Updates" button in the selected goal         The patient verbalized understanding of instructions, educational materials, and care plan provided today and agreed to receive a mailed copy of patient instructions, educational materials, and care plan.   Telephone follow up  appointment with pharmacy team member scheduled for: 3 months  Edythe Clarity, [facility]  Managing Your Hypertension Hypertension, also called high blood pressure, is when the force of the blood pressing against the walls of the arteries is too strong. Arteries are blood vessels that carry blood from your heart throughout your body. Hypertension forces the heart to work harder to pump blood and may cause the arteries to become narrow or stiff. Understanding blood pressure readings Your personal target blood pressure may vary depending on your medical conditions, your age, and other factors. A blood pressure reading includes a higher number over a lower number. Ideally, your blood pressure should be below 120/80. You should know that:  The first, or top, number is called the systolic pressure. It is a measure of the pressure in your arteries as your heart beats.  The second, or bottom number, is called the diastolic pressure. It is a measure of the pressure in your arteries as the heart relaxes. Blood pressure is classified into four stages. Based on your blood pressure reading, your health care provider may use the following stages to determine what type of treatment you need, if any. Systolic pressure and diastolic pressure are measured in a unit called mmHg. Normal  Systolic pressure: below 123456.  Diastolic pressure: below 80. Elevated  Systolic pressure: [id number].  Diastolic pressure: below 80. Hypertension stage 1  Systolic pressure: 0000000.  Diastolic pressure: [id number]. Hypertension stage 2  Systolic pressure: [id number] or above.  Diastolic pressure: 90 or above. How can this condition affect me? Managing your hypertension is an important responsibility. Over time, hypertension can damage the arteries and decrease blood flow to important parts of the body, including the brain, heart, and kidneys. Having untreated or uncontrolled hypertension can lead to:  A heart attack.  A  stroke.  A weakened blood vessel (aneurysm).  Heart failure.  Kidney damage.  Eye damage.  Metabolic syndrome.  Memory and concentration problems.  Vascular dementia. What actions can I take to manage this condition? Hypertension can be managed by making lifestyle changes and possibly by taking medicines. Your health care provider will help you make a plan to bring your blood pressure within a normal range. Nutrition  Eat a diet that is high in fiber and potassium, and low in salt (sodium), added sugar, and fat. An example eating plan is called the Dietary Approaches to Stop Hypertension (DASH) diet. To eat this way: ? Eat plenty of fresh fruits and vegetables. Try to fill one-half of your plate at each meal with fruits and vegetables. ? Eat whole grains, such as whole-wheat pasta, brown rice, or whole-grain bread. Fill about one-fourth of your plate with whole grains. ? Eat low-fat dairy products. ? Avoid fatty cuts of meat, processed or cured meats, and poultry with skin. Fill about one-fourth of your plate with lean proteins such as fish, chicken without skin, beans, eggs, and tofu. ? Avoid pre-made and processed foods. These tend to be higher in sodium, added sugar, and fat.  Reduce your daily sodium intake. Most people with hypertension should eat less than 1,500 mg of sodium a day.   Lifestyle  Work with your health care provider to maintain  a healthy body weight or to lose weight. Ask what an ideal weight is for you.  Get at least 30 minutes of exercise that causes your heart to beat faster (aerobic exercise) most days of the week. Activities may include walking, swimming, or biking.  Include exercise to strengthen your muscles (resistance exercise), such as weight lifting, as part of your weekly exercise routine. Try to do these types of exercises for 30 minutes at least 3 days a week.  Do not use any products that contain nicotine or tobacco, such as cigarettes,  e-cigarettes, and chewing tobacco. If you need help quitting, ask your health care provider.  Control any long-term (chronic) conditions you have, such as high cholesterol or diabetes.  Identify your sources of stress and find ways to manage stress. This may include meditation, deep breathing, or making time for fun activities.   Alcohol use  Do not drink alcohol if: ? Your health care provider tells you not to drink. ? You are pregnant, may be pregnant, or are planning to become pregnant.  If you drink alcohol: ? Limit how much you use to:  0-1 drink a day for women.  0-2 drinks a day for men. ? Be aware of how much alcohol is in your drink. In the [place] one drink equals one 12 oz bottle of beer (355 mL), one 5 oz glass of wine (148 mL), or one 1 oz glass of hard liquor (44 mL). Medicines Your health care provider may prescribe medicine if lifestyle changes are not enough to get your blood pressure under control and if:  Your systolic blood pressure is 130 or higher.  Your diastolic blood pressure is 80 or higher. Take medicines only as told by your health care provider. Follow the directions carefully. Blood pressure medicines must be taken as told by your health care provider. The medicine does not work as well when you skip doses. Skipping doses also puts you at risk for problems. Monitoring Before you monitor your blood pressure:  Do not smoke, drink caffeinated beverages, or exercise within 30 minutes before taking a measurement.  Use the bathroom and empty your bladder (urinate).  Sit quietly for at least 5 minutes before taking measurements. Monitor your blood pressure at home as told by your health care provider. To do this:  Sit with your back straight and supported.  Place your feet flat on the floor. Do not cross your legs.  Support your arm on a flat surface, such as a table. Make sure your upper arm is at heart level.  Each time you measure, take two or three  readings one minute apart and record the results. You may also need to have your blood pressure checked regularly by your health care provider.   General information  Talk with your health care provider about your diet, exercise habits, and other lifestyle factors that may be contributing to hypertension.  Review all the medicines you take with your health care provider because there may be side effects or interactions.  Keep all visits as told by your health care provider. Your health care provider can help you create and adjust your plan for managing your high blood pressure. Where to find more information  National Heart, Lung, and Blood Institute: https://wilson-eaton.com/  American Heart Association: www.heart.org Contact a health care provider if:  You think you are having a reaction to medicines you have taken.  You have repeated (recurrent) headaches.  You feel dizzy.  You have swelling in  your ankles.  You have trouble with your vision. Get help right away if:  You develop a severe headache or confusion.  You have unusual weakness or numbness, or you feel faint.  You have severe pain in your chest or abdomen.  You vomit repeatedly.  You have trouble breathing. These symptoms may represent a serious problem that is an emergency. Do not wait to see if the symptoms will go away. Get medical help right away. Call your local emergency services (911 in the [place] Do not drive yourself to the hospital. Summary  Hypertension is when the force of blood pumping through your arteries is too strong. If this condition is not controlled, it may put you at risk for serious complications.  Your personal target blood pressure may vary depending on your medical conditions, your age, and other factors. For most people, a normal blood pressure is less than 120/80.  Hypertension is managed by lifestyle changes, medicines, or both.  Lifestyle changes to help manage hypertension include losing  weight, eating a healthy, low-sodium diet, exercising more, stopping smoking, and limiting alcohol. This information is not intended to replace advice given to you by your health care provider. Make sure you discuss any questions you have with your health care provider. Document Revised: 11/03/2019 Document Reviewed: 08/29/2019 Elsevier Patient Education  2021 [employer].

## 2020-12-03 ENCOUNTER — Other Ambulatory Visit: Payer: Self-pay | Admitting: Internal Medicine

## 2020-12-12 ENCOUNTER — Other Ambulatory Visit: Payer: Self-pay | Admitting: Internal Medicine

## 2020-12-12 NOTE — Telephone Encounter (Signed)
Do you want to prescribe? Not on current med list.

## 2020-12-17 ENCOUNTER — Telehealth: Payer: Self-pay | Admitting: Pharmacist

## 2020-12-17 NOTE — Progress Notes (Addendum)
Chronic Care Management Pharmacy Assistant   Name: William Hendrix  MRN: [id number] DOB: 04-13-1941  Reason for Encounter: General Disease State Call   Conditions to be addressed/monitored: Hypertension, Hyperlipidemia/CAD, Diabetes, GERD, Depression, Insomnia, BPH, Gout, Lumbar Radiculopathy  Recent office visits: None since 11/13/20  Recent consult visits:  None since 11/13/20  Hospital visits: None since 11/13/20  Medications: Outpatient Encounter Medications as of 12/17/2020  Medication Sig Note   acetaminophen (TYLENOL) 500 MG tablet Take 500 mg by mouth 2 (two) times daily.     alfuzosin (UROXATRAL) 10 MG 24 hr tablet Take 10 mg by mouth daily with breakfast.    allopurinol (ZYLOPRIM) 300 MG tablet TAKE 1 TABLET EVERY DAY    aspirin EC 81 MG tablet Take 81 mg by mouth daily. 03/18/2020: Last dose was 03/14/20   atorvastatin (LIPITOR) 40 MG tablet TAKE 1 TABLET EVERY DAY    azithromycin (ZITHROMAX Z-PAK) 250 MG tablet Take 1 tablet (250 mg total) by mouth daily. 2 tabs today, then 1 tab daily x 4 more days.    B Complex Vitamins (B COMPLEX-B12 PO) Take 1 tablet by mouth daily.     benzonatate (TESSALON) 200 MG capsule Take 1 capsule (200 mg total) by mouth 3 (three) times daily as needed for cough.    Blood Glucose Monitoring Suppl (TRUE METRIX AIR GLUCOSE METER) DEVI Use to check blood sugar.    cefUROXime (CEFTIN) 500 MG tablet Take 1 tablet (500 mg total) by mouth 2 (two) times daily with a meal.    Chlorpheniramine Maleate (CHLOR-TRIMETON ALLERGY PO) Take 1 tablet by mouth every 6 (six) hours as needed (allergies).    Cholecalciferol (VITAMIN D3) 50 MCG (2000 UT) capsule Take 2,000 Units by mouth at bedtime.     fenofibrate 160 MG tablet TAKE 1 TABLET (160 MG TOTAL) BY MOUTH AT BEDTIME.    fluocinonide cream (LIDEX) 5.32 % Apply 1 application topically daily as needed (for rashes).     fluticasone (FLONASE) 50 MCG/ACT nasal spray Place 2 sprays into both nostrils daily. (Patient  taking differently: Place 2 sprays into both nostrils daily as needed for allergies.) 08/13/2020: Uses once every 2-3 months; symptoms from CPAP   furosemide (LASIX) 40 MG tablet TAKE 2 TABLETS (80 MG TOTAL) BY MOUTH DAILY.    HYDROcodone-acetaminophen (NORCO) 5-325 MG tablet Take 0.5-1 tablets by mouth at bedtime as needed for moderate pain.    insulin NPH Human (NOVOLIN N) 100 UNIT/ML injection Inject 0.5 mLs (50 Units total) into the skin 2 (two) times daily before a meal.    insulin regular (NOVOLIN R RELION) 100 units/mL injection Inject 25-30 units 3 times a day before meals.    Insulin Syringe-Needle U-100 (BD INSULIN SYRINGE U/F) 31G X 5/16" 0.3 ML MISC Use to inject insulin 5 times a day.    isosorbide mononitrate (IMDUR) 30 MG 24 hr tablet TAKE 1 TABLET (30 MG TOTAL) BY MOUTH DAILY.    MAGNESIUM PO Take 1 tablet by mouth at bedtime.    Melatonin 10 MG TABS Take 10 mg by mouth at bedtime.    metoprolol tartrate (LOPRESSOR) 25 MG tablet Take 1 tablet (25 mg total) by mouth 2 (two) times daily. (Patient taking differently: Take 12.5 mg by mouth 2 (two) times daily.)    nitroGLYCERIN (NITROSTAT) 0.4 MG SL tablet Place 1 tablet (0.4 mg total) under the tongue every 5 (five) minutes as needed for chest pain. 05/10/2019: Has on hand   pantoprazole (PROTONIX) 20  MG tablet Take 1 tablet (20 mg total) by mouth daily.    Probiotic Product (PROBIOTIC PO) Take 1 capsule by mouth at bedtime.     promethazine-dextromethorphan (PROMETHAZINE-DM) 6.25-15 MG/5ML syrup Take 5 mLs by mouth 4 (four) times daily as needed for cough.    tiZANidine (ZANAFLEX) 4 MG tablet Take 1 tablet (4 mg total) by mouth at bedtime as needed for muscle spasms.    TRUE METRIX BLOOD GLUCOSE TEST test strip TEST BLOOD SUGAR THREE TIMES DAILY AS DIRECTED    TRUEplus Lancets 33G MISC USE TO CHECK BLOOD SUGAR 3 TIMES A DAY    No facility-administered encounter medications on file as of 12/17/2020.   GEN Call: Patient stated he is going  to see his neurosurgeon and they had planned on giving him shots into his spine around his hips. He stated he hurts so bad he cant walk to the mailbox. He's been taking hydrocodone a half of tablet with a muscle relaxer so he can sleep. He stated he would like to continue taking this regimen while they go through the process with neurosurgeon. He stated he takes his blood pressure daily and blood sugar, both have been running well. He stated he checks his blood sugar three times a day. He stated he does all of his cooking and he has a [place] come in every so often.   Star Rating Drugs:Atorvastatin 40 mg 10/28/20 90 DS   Follow-Up: Pharmacist Review  Charlann Lange, RMA Clinical Pharmacist Assistant [phone]  7 minutes spent in review, coordination, and documentation.  Reviewed by: Beverly Milch, PharmD Clinical Pharmacist [facility] [phone]

## 2020-12-19 DIAGNOSIS — M48061 Spinal stenosis, lumbar region without neurogenic claudication: Secondary | ICD-10-CM | POA: Diagnosis not present

## 2020-12-19 DIAGNOSIS — Z981 Arthrodesis status: Secondary | ICD-10-CM | POA: Diagnosis not present

## 2020-12-19 DIAGNOSIS — M47816 Spondylosis without myelopathy or radiculopathy, lumbar region: Secondary | ICD-10-CM | POA: Diagnosis not present

## 2020-12-19 NOTE — Telephone Encounter (Signed)
MEDICATION: Insulin Syringe-Needle U-100 (BD INSULIN SYRINGE U/F) 31G X 5/16" 0.3 ML MISC  PHARMACY:  [facility] CONTACTED THEIR PHARMACY?  yes  IS THIS A 90 DAY SUPPLY : unknown  IS PATIENT OUT OF MEDICATION: yes  IF NOT; HOW MUCH IS LEFT:   LAST APPOINTMENT DATE: @2 /22/2022  NEXT APPOINTMENT DATE:@4 /02/2021  DO WE HAVE YOUR PERMISSION TO LEAVE A DETAILED MESSAGE?:  Yes  OTHER COMMENTS:    **Let patient know to contact pharmacy at the end of the day to make sure medication is ready. **  ** Please notify patient to allow 48-72 hours to process**  **Encourage patient to contact the pharmacy for refills or they can request refills through [facility]**

## 2020-12-25 ENCOUNTER — Other Ambulatory Visit: Payer: Self-pay | Admitting: Internal Medicine

## 2021-01-09 ENCOUNTER — Other Ambulatory Visit: Payer: Self-pay | Admitting: Family Medicine

## 2021-01-09 ENCOUNTER — Telehealth: Payer: Self-pay | Admitting: Family Medicine

## 2021-01-09 MED ORDER — HYDROCODONE-ACETAMINOPHEN 5-325 MG PO TABS: 0.5000 | ORAL_TABLET | Freq: Every evening | ORAL | 0 refills | Status: DC | PRN

## 2021-01-09 NOTE — Telephone Encounter (Signed)
Medication: HYDROcodone-acetaminophen (NORCO) 5-325 MG tablet [[id number]]    Has the patient contacted their pharmacy? No. (If no, request that the patient contact the pharmacy for the refill.) (If yes, when and what did the pharmacy advise?)  Preferred Pharmacy (with phone number or street name):  [facility] [place], [address] [place]  [place], [place] [address]  Phone:  [phone] Fax:  [phone]  DEA #:  [id number]  [place] Reason: --    Agent: Please be advised that RX refills may take up to 3 business days. We ask that you follow-up with your pharmacy.

## 2021-01-09 NOTE — Telephone Encounter (Signed)
done

## 2021-01-09 NOTE — Telephone Encounter (Signed)
Requesting: hydrocodone Contract: 09/24/20 UDS: WILL GET AT NEXT VISIT Last Visit: 09/24/20 Next Visit: 04/01/21 Last Refill: 11/05/20  Please Advise

## 2021-01-14 ENCOUNTER — Ambulatory Visit: Payer: Medicare HMO | Admitting: Internal Medicine

## 2021-01-14 ENCOUNTER — Encounter: Payer: Self-pay | Admitting: Internal Medicine

## 2021-01-14 ENCOUNTER — Other Ambulatory Visit: Payer: Self-pay

## 2021-01-14 VITALS — BP 120/60 | HR 74 | Ht 71.0 in | Wt 243.3 lb

## 2021-01-14 DIAGNOSIS — Z6837 Body mass index (BMI) 37.0-37.9, adult: Secondary | ICD-10-CM | POA: Diagnosis not present

## 2021-01-14 DIAGNOSIS — E785 Hyperlipidemia, unspecified: Secondary | ICD-10-CM

## 2021-01-14 DIAGNOSIS — Z794 Long term (current) use of insulin: Secondary | ICD-10-CM | POA: Diagnosis not present

## 2021-01-14 DIAGNOSIS — E1159 Type 2 diabetes mellitus with other circulatory complications: Secondary | ICD-10-CM | POA: Diagnosis not present

## 2021-01-14 LAB — POCT GLYCOSYLATED HEMOGLOBIN (HGB A1C): Hemoglobin A1C: 6.7 % — AB (ref 4.0–5.6)

## 2021-01-14 NOTE — Patient Instructions (Addendum)
Please continue: Insulin Before breakfast Before lunch Before dinner  Regular 30 30 30   NPH 40-50  50   If the sugars before the meal are: <80: skip the R insulin before that meal 80-100: take only 10 units of R insulin 101-120: take only 15 units of R insulin  Otherwise, take the full 30 units dose.  If you take dinnertime insulin after dinner, take 50% of the dose.  Please return in 4 months with your sugar log.

## 2021-01-14 NOTE — Progress Notes (Signed)
Patient ID: William Hendrix, male   DOB: [birth year], 80 y.o.   MRN: [id number]  This visit occurred during the SARS-CoV-2 public health emergency.  Safety protocols were in place, including screening questions prior to the visit, additional usage of staff PPE, and extensive cleaning of exam room while observing appropriate contact time as indicated for disinfecting solutions.   HPI: William Hendrix is a 80 y.o.-year-old male, returning for f/u for DM2, dx 2009, insulin-dependent, uncontrolled, with complications (CAD s/p AMI 2017, CKD stage 3). Last visit 4 months ago.  Interim history: In 2018, he eliminated meat almost completely and his sugars improved significantly while he also lost weight.  He continues to eat many vegetables, but he reintroduced meat, bacon, eggs.  Sugars started to increase. He denies blurry vision, increased urination, GI symptoms.  Reviewed HbA1c levels: Lab Results  Component Value Date   HGBA1C 6.2 (A) 09/10/2020   HGBA1C 6.7 (A) 04/30/2020   HGBA1C 8.0 (H) 11/21/2019   He is on: ReliOn insulin: Insulin Before breakfast Before lunch Before dinner  Regular 25-32 >> 0, 30 25-32 >> 0, 30 25-32 >> 30 (if sugars <100, take 15 units)  NPH 40-50  50  If you take dinnertime insulin after dinner, take 50% of the dose.  We cannot use Metformin 2/2 CKD. We stopped Januvia >> could not afford ($100/3 mo)  We cannot use Levemir >> developed a severe skin reaction We stopped Amaryl 4 mg.  He checks sugars 3 times a day per review of his log: - am: 81, 87, 108-193, 202, 221 >> 64, 73, 81-146, 154 >> 53, 65-137, 149, 210 (steroid inj in back) - Before lunch: 119-176, 193 >> 163-265 >> 97-142, 231 >> 110-133 >> 96, 106-160 - before dinner: 111, 195-326 >> 73, 88-209, 220 >> 63, 74-194 >> 108-203, 220, 326 - Before bedtime: 57, 120-145 >> 180-201 >> n/c >> 127-173, 207 Lowest sugar was  73 >> 63 >> 53 (am); it is unclear at which level he has hypoglycemia  awareness. Highest sugar was  231 >> 207 >> 326.  Meals: - Breakfast: oatmeal + toast + tomatoes - Lunch: beans  - Dinner:home cooked veggies   + CKD, last BUN/creatinine was:  Lab Results  Component Value Date   BUN 33 (H) 08/28/2020   CREATININE 1.51 (H) 08/28/2020   ACR levels were normal: Lab Results  Component Value Date   MICRALBCREAT 5.1 08/02/2014   MICRALBCREAT 0.7 02/23/2013   Reviewed GFR levels: Lab Results  Component Value Date   GFRAA 50 (L) 08/28/2020   GFRAA 48 (L) 01/09/2020   GFRAA 49 (L) 12/13/2019   GFRAA 48 (L) 04/22/2019   GFRAA 44 (L) 12/29/2018   GFRAA 39 (L) 11/12/2018   GFRAA 32 (L) 11/11/2018   GFRAA 44 (L) 09/16/2018   GFRAA 43 (L) 09/15/2018   GFRAA 46 (L) 08/21/2018   + HL; latest lipids: Lab Results  Component Value Date   CHOL 128 08/28/2020   HDL 25 (L) 08/28/2020   LDLCALC 64 08/28/2020   LDLDIRECT 57.0 11/21/2019   TRIG 240 (H) 08/28/2020   CHOLHDL 5.1 (H) 08/28/2020  On Lipitor, fenofibrate, flaxseed oil  Pt's last eye exam was 11/2019: No DR. Had cataract sx in 2011.  + numbness and tingling in his legs.  He had back surgery in 03/2016 and 10/2017.  He developed a kidney stone after his last surgery. He was admitted 2x in 2017 for: CP/SOB >> AMI.  He had 2  stents placed then. He had a cardiac cath 02/2017 >> no new blockages He was seen emergency room with colitis 04/22/2019.    PMH: Pt also also has a history of CAD, HTN, CKD stage III, history of nephrolithiasis, hiatal hernia, OSA, obesity, melanoma, spinal stenosis.  ROS: Constitutional: no weight gain/no weight loss, no fatigue, no subjective hyperthermia, no subjective hypothermia Eyes: no blurry vision, no xerophthalmia ENT: no sore throat, no nodules palpated in neck, no dysphagia, no odynophagia, no hoarseness Cardiovascular: no CP/no SOB/no palpitations/no leg swelling Respiratory: no cough/no SOB/no wheezing Gastrointestinal: no N/no V/no D/no C/no acid  reflux Musculoskeletal: + muscle aches/+ joint aches (back) Skin: no rashes, no hair loss Neurological: + tremors/+ numbness/+ tingling/no dizziness  I reviewed pt's medications, allergies, PMH, social hx, family hx, and changes were documented in the history of present illness. Otherwise, unchanged from my initial visit note.  Past Medical History:  Diagnosis Date  . AAA (abdominal aortic aneurysm) ([place]) 12/15/2014   a. Mild aneurysmal dilatation of the infrarenal abdominal aorta 3.2 cm - f/u due by 2019.  . Arthritis    "all over" (07/30/2016)  . CAD (coronary artery disease)    a. stent to LAD 2000. b. possible spasm by cath 2001. c. IVUS/PTCA/DES to mLAD 05/2013. d. PTCA/DES of dRCA into ostial rPDA 07/2013. e. PTCA of OM2, DES to Cx, DES to [facility] 07/2016.  Marland Kitchen Chronic bronchitis ([place])   . Chronic diastolic CHF (congestive heart failure) ([place])   . Chronic lower back pain   . Chronic neck pain   . CKD (chronic kidney disease), stage III ([place])   . Diabetic peripheral neuropathy ([place])   . Ejection fraction    55%, 07/2010, mild inferior hypo  . GERD (gastroesophageal reflux disease)   . Gout   . H/O diverticulitis of colon 01/31/2015  . H/O hiatal hernia   . History of blood transfusion ~ 10/1940   "had pneumonia"  . History of kidney stones   . HTN (hypertension)   . Hyperlipidemia   . Melanoma of forearm, left ([place]) 02/2011   with wide excision   . Myocardial infarction ([place])   . Nephrolithiasis   . Obesity   . OSA on CPAP     [name] since 2000  . Peripheral neuropathy    both feet  . Positive D-dimer    a. significant elevation ,hospital 07/2010, etiology unclear. b. D Dimer chronically > 20.  Marland Kitchen Renal insufficiency   . Skin cancer   . Spinal stenosis    a. s/p surgical repair 2013  . Spinal stenosis of lumbar region   . Type II diabetes mellitus ([place])   . Ventral hernia    Past Surgical History:  Procedure Laterality Date  . ANTERIOR LAT LUMBAR FUSION  10/22/2017    Procedure: Lumbar two-three Lumbar three-four Lumbar four-five Anteriolateral lumbar interbody fusion with percutaneous pedicle screw fixation and infuse;  Surgeon: [name], MD;  Location: [place];  Service: Neurosurgery;;  . APPLICATION OF ROBOTIC ASSISTANCE FOR SPINAL PROCEDURE  10/22/2017   Procedure: APPLICATION OF ROBOTIC ASSISTANCE FOR SPINAL PROCEDURE;  Surgeon: [name], MD;  Location: [place];  Service: Neurosurgery;;  . BACK SURGERY    . CARDIAC CATHETERIZATION N/A 07/30/2016   Procedure: Left Heart Cath and Coronary Angiography;  Surgeon: Nelva Bush, MD;  Location: [place] CV LAB;  Service: Cardiovascular;  Laterality: N/A;  . CARDIAC CATHETERIZATION N/A 07/30/2016   Procedure: Coronary Stent Intervention;  Surgeon: Nelva Bush, MD;  Location: [place] CV LAB;  Service: Cardiovascular;  Laterality: N/A;  Mid CFX and MID RCA  . CARDIAC CATHETERIZATION N/A 07/30/2016   Procedure: Coronary Balloon Angioplasty;  Surgeon: Nelva Bush, MD;  Location: [place] CV LAB;  Service: Cardiovascular;  Laterality: N/A;  OM 1  . CARPAL TUNNEL RELEASE Left   . CATARACT EXTRACTION W/ INTRAOCULAR LENS  IMPLANT, BILATERAL Bilateral ~ 2014  . CERVICAL DISC SURGERY  1990s  . CORONARY ANGIOPLASTY WITH STENT PLACEMENT  05/2013  . CORONARY ANGIOPLASTY WITH STENT PLACEMENT  07/26/2013   DES to RCA extending to PDA    . CORONARY ANGIOPLASTY WITH STENT PLACEMENT  07/30/2016  . CORONARY ANGIOPLASTY WITH STENT PLACEMENT  2000   CAD  . DENTAL SURGERY  04/2016   "got infected; had to dig it out"  . EYE SURGERY    . KNEE ARTHROSCOPY Right 1990's   rt  . LEFT AND RIGHT HEART CATHETERIZATION WITH CORONARY ANGIOGRAM N/A 07/26/2013   Procedure: LEFT AND RIGHT HEART CATHETERIZATION WITH CORONARY ANGIOGRAM;  Surgeon: [name], MD;  Location: [place] CATH LAB;  Service: Cardiovascular;  Laterality: N/A;  . LEFT HEART CATH AND CORONARY ANGIOGRAPHY N/A 02/26/2017   Procedure: Left Heart  Cath and Coronary Angiography;  Surgeon: Sherren Mocha, MD;  Location: [place] CV LAB;  Service: Cardiovascular;  Laterality: N/A;  . LEFT HEART CATHETERIZATION WITH CORONARY ANGIOGRAM N/A 05/19/2013   Procedure: LEFT HEART CATHETERIZATION WITH CORONARY ANGIOGRAM;  Surgeon: [name], MD;  Location: [facility] CATH LAB;  Service: Cardiovascular;  Laterality: N/A;  . LUMBAR LAMINECTOMY/DECOMPRESSION MICRODISCECTOMY  08/30/2012   Procedure: LUMBAR LAMINECTOMY/DECOMPRESSION MICRODISCECTOMY 2 LEVELS;  Surgeon: [name], MD;  Location: [place] NEURO ORS;  Service: Neurosurgery;  Laterality: Bilateral;  Bilateral Lumbar three-four Lumbar four-five Laminotomies  . LUMBAR PERCUTANEOUS PEDICLE SCREW 1 LEVEL Bilateral 10/22/2017   Procedure: LUMBAR PERCUTANEOUS PEDICLE SCREW 1 LEVEL;  Surgeon: [name], MD;  Location: [place];  Service: Neurosurgery;  Laterality: Bilateral;  . LUMBAR SPINE SURGERY  04/07/2016   Dr. Ellene Route; "?ruptured disc"  . MELANOMA EXCISION Left 02/2011   forearm  . NASAL SINUS SURGERY  1970s   "cut windows in sinus pockets"  . PERCUTANEOUS CORONARY STENT INTERVENTION (PCI-S)  05/19/2013   Procedure: PERCUTANEOUS CORONARY STENT INTERVENTION (PCI-S);  Surgeon: [name], MD;  Location: [facility] CATH LAB;  Service: Cardiovascular;;  . ULNAR TUNNEL RELEASE Left 04/07/2016   Dr. Ellene Route   Social History   Socioeconomic History  . Marital status: Married    Spouse name: Not on file  . Number of children: Not on file  . Years of education: Not on file  . Highest education level: Not on file  Occupational History  . Occupation: Painter  Tobacco Use  . Smoking status: Former Smoker    Packs/day: 3.00    Years: 30.00    Pack years: 90.00    Types: Cigarettes    Quit date: 09/17/1996    Years since quitting: 24.3  . Smokeless tobacco: Never Used  Vaping Use  . Vaping Use: Never used  Substance and Sexual Activity  . Alcohol use: No  . Drug use: No  . Sexual activity: Not on  file  Other Topics Concern  . Not on file  Social History Narrative   Married and lives locally with his wife.  Sharyon Cable.   Social Determinants of Health   Financial Resource Strain: Not on file  Food Insecurity: Not on file  Transportation Needs: Not on  file  Physical Activity: Not on file  Stress: Not on file  Social Connections: Not on file  Intimate Partner Violence: Not on file   Current Outpatient Medications on File Prior to Visit  Medication Sig Dispense Refill  . acetaminophen (TYLENOL) 500 MG tablet Take 500 mg by mouth 2 (two) times daily.     Marland Kitchen alfuzosin (UROXATRAL) 10 MG 24 hr tablet Take 10 mg by mouth daily with breakfast.    . allopurinol (ZYLOPRIM) 300 MG tablet TAKE 1 TABLET EVERY DAY 90 tablet 1  . aspirin EC 81 MG tablet Take 81 mg by mouth daily.    Marland Kitchen atorvastatin (LIPITOR) 40 MG tablet TAKE 1 TABLET EVERY DAY 90 tablet 1  . azithromycin (ZITHROMAX Z-PAK) 250 MG tablet Take 1 tablet (250 mg total) by mouth daily. 2 tabs today, then 1 tab daily x 4 more days. 6 tablet 0  . B Complex Vitamins (B COMPLEX-B12 PO) Take 1 tablet by mouth daily.     . benzonatate (TESSALON) 200 MG capsule Take 1 capsule (200 mg total) by mouth 3 (three) times daily as needed for cough. 15 capsule 0  . Blood Glucose Monitoring Suppl (TRUE METRIX METER) w/Device KIT USE AS DIRECTED  TO TEST BLOOD GLUCOSE 1 kit 0  . cefUROXime (CEFTIN) 500 MG tablet Take 1 tablet (500 mg total) by mouth 2 (two) times daily with a meal. 20 tablet 0  . Chlorpheniramine Maleate (CHLOR-TRIMETON ALLERGY PO) Take 1 tablet by mouth every 6 (six) hours as needed (allergies).    . Cholecalciferol (VITAMIN D3) 50 MCG (2000 UT) capsule Take 2,000 Units by mouth at bedtime.     . DROPLET INSULIN SYRINGE 31G X 5/16" 1 ML MISC USE TO INJECT INSULIN 5 TIMES DAILY 500 each 0  . fenofibrate 160 MG tablet TAKE 1 TABLET (160 MG TOTAL) BY MOUTH AT BEDTIME. 90 tablet 1  . fluocinonide cream (LIDEX) 8.67 % Apply 1 application  topically daily as needed (for rashes).     . fluticasone (FLONASE) 50 MCG/ACT nasal spray Place 2 sprays into both nostrils daily. (Patient taking differently: Place 2 sprays into both nostrils daily as needed for allergies.) 16 g 1  . furosemide (LASIX) 40 MG tablet TAKE 2 TABLETS (80 MG TOTAL) BY MOUTH DAILY. 180 tablet 3  . HYDROcodone-acetaminophen (NORCO) 5-325 MG tablet Take 0.5-1 tablets by mouth at bedtime as needed for moderate pain. 30 tablet 0  . insulin NPH Human (NOVOLIN N) 100 UNIT/ML injection Inject 0.5 mLs (50 Units total) into the skin 2 (two) times daily before a meal. 10 mL 11  . insulin regular (NOVOLIN R RELION) 100 units/mL injection Inject 25-30 units 3 times a day before meals. 60 mL 1  . Insulin Syringe-Needle U-100 (BD INSULIN SYRINGE U/F) 31G X 5/16" 0.3 ML MISC Use to inject insulin 5 times a day. 500 each 11  . isosorbide mononitrate (IMDUR) 30 MG 24 hr tablet TAKE 1 TABLET (30 MG TOTAL) BY MOUTH DAILY. 90 tablet 3  . MAGNESIUM PO Take 1 tablet by mouth at bedtime.    . Melatonin 10 MG TABS Take 10 mg by mouth at bedtime.    . metoprolol tartrate (LOPRESSOR) 25 MG tablet Take 1 tablet (25 mg total) by mouth 2 (two) times daily. (Patient taking differently: Take 12.5 mg by mouth 2 (two) times daily.) 180 tablet 1  . nitroGLYCERIN (NITROSTAT) 0.4 MG SL tablet Place 1 tablet (0.4 mg total) under the tongue every 5 (five)  minutes as needed for chest pain. 100 tablet 3  . pantoprazole (PROTONIX) 20 MG tablet Take 1 tablet (20 mg total) by mouth daily. 90 tablet 3  . Probiotic Product (PROBIOTIC PO) Take 1 capsule by mouth at bedtime.     . promethazine-dextromethorphan (PROMETHAZINE-DM) 6.25-15 MG/5ML syrup Take 5 mLs by mouth 4 (four) times daily as needed for cough. 150 mL 0  . tiZANidine (ZANAFLEX) 4 MG tablet Take 1 tablet (4 mg total) by mouth at bedtime as needed for muscle spasms. 30 tablet 5  . TRUE METRIX BLOOD GLUCOSE TEST test strip TEST BLOOD SUGAR THREE TIMES  DAILY AS DIRECTED 300 strip 11  . TRUEplus Lancets 33G MISC USE TO CHECK BLOOD SUGAR 3 TIMES A DAY 300 each 3   No current facility-administered medications on file prior to visit.   Allergies  Allergen Reactions  . Brilinta [Ticagrelor] Shortness Of Breath  . Insulin Detemir Swelling, Other (See Comments) and Itching    Patient had redness and swelling and tenderness at injection site. Patient had redness and swelling and tenderness at injection site.  . Bee Venom   . Codeine Other (See Comments)    Makes him "shakey", is OK with hydrocodone GI UPSET & TREMORS Makes him "shakey", is OK with hydrocodone Makes him "shakey", is OK with hydrocodone GI UPSET & TREMORS  . Lipitor [Atorvastatin] Other (See Comments)    Muscle aches; affects liver function- Patient is currently taking  . Novolog Mix [Insulin Aspart Prot & Aspart] Other (See Comments)    Causes skin to swell/itch at injection site   Family History  Problem Relation Age of Onset  . Cancer Mother        intestinal   . Heart disease Mother   . Cancer Father        prostate  . Migraines Daughter   . Leukemia Sister   . Heart disease Sister   . Prostate cancer Other   . Kidney cancer Other   . Cancer Other        Bladder cancer  . Coronary artery disease Other   . Hypertension Sister   . Hypertension Brother   . Heart attack Neg Hx   . Stroke Neg Hx     PE: BP 120/60 (BP Location: Right Arm, Patient Position: Sitting, Cuff Size: Normal)   Pulse 74   Ht '5\' 11"'  (1.803 m)   Wt 243 lb 4.8 oz (110.4 kg)   SpO2 96%   BMI 33.93 kg/m  Body mass index is 33.93 kg/m. Wt Readings from Last 3 Encounters:  01/14/21 243 lb 4.8 oz (110.4 kg)  10/14/20 247 lb (112 kg)  09/24/20 246 lb 11.2 oz (111.9 kg)   Constitutional: overweight, in NAD Eyes: PERRLA, EOMI, no exophthalmos ENT: moist mucous membranes, no thyromegaly, no cervical lymphadenopathy Cardiovascular: RRR, No MRG Respiratory: CTA B Gastrointestinal:  abdomen soft, NT, ND, BS+ Musculoskeletal: no deformities, strength intact in all 4 Skin: moist, warm, no rashes Neurological: + tremor with outstretched hands, DTR normal in all 4  ASSESSMENT: 1. DM2, insulin-dependent, uncontrolled, with complications - CAD, s/p stents - He had stenting of distal RCA/PDA in 07/2013. Prior stenting of LAD in 04/2013. 2 new stents: PCI to left circumflex and the mid RCA on 07/30/2016. - AAA - CKD stage 3  2. HL  3.  Obesity class II BMI Classification:  < 18.5 underweight   18.5-24.9 normal weight   25.0-29.9 overweight   30.0-34.9 class I obesity  35.0-39.9 class II obesity   ? 40.0 class III obesity   PLAN:  1. Patient with history of uncontrolled type 2 diabetes, with better control recently, on basal-bolus insulin regimen with NPH and regular insulin due to price.  He had a significant improvement in his blood sugars after changing to a mostly plant-based diet, even though he relaxed the diet control afterwards.  At last visit, he was not varying the dose of regular insulin based on his meals so sugars are fluctuating, from 60s to 200s.  We discussed about using different doses of regular insulin for different meals and also based on the sugars before meals.  HbA1c was excellent at that time, at 6.2%, decreased. -At this visit, he tells me that he is now back to eating eggs, bacon, sausage in the morning and he feels that this may have increased his blood sugars, which are now higher.  In the morning, though, they remain controlled (had to lower blood sugars morning the 15 morning the 60s recently).  Throughout the day, sugars are higher than goal and upon questioning, she is not taking the regular insulin if the sugars are lower than 120.  At this visit, we discussed that dosing the regular insulin should not be based on an organizing concept and he can adjust the doses of insulin based on his blood sugars.  I advised him to only hold the regular  insulin if his sugars are lower than 80 before the meal.  Otherwise, we can continue with the same doses of NPH insulin. - I advised him to: Patient Instructions   Please continue: Insulin Before breakfast Before lunch Before dinner  Regular '30 30 30  ' NPH 40-50  50   If the sugars before the meal are: <80: skip the R insulin before that meal 80-100: take only 10 units of R insulin 101-120: take only 15 units of R insulin  Otherwise, take the full 30 units dose.  If you take dinnertime insulin after dinner, take 50% of the dose.  Please return in 4 months with your sugar log.   - we checked his HbA1c: 6.7% (higher) - advised to check sugars at different times of the day - 3-4x a day, rotating check times - advised for yearly eye exams >> he is UTD - return to clinic in 4 months  2. HL -Reviewed latest lipid panel from 08/2020: LDL at goal, triglycerides high, HDL low: Lab Results  Component Value Date   CHOL 128 08/28/2020   HDL 25 (L) 08/28/2020   LDLCALC 64 08/28/2020   LDLDIRECT 57.0 11/21/2019   TRIG 240 (H) 08/28/2020   CHOLHDL 5.1 (H) 08/28/2020  -He continues on Lipitor 40, fenofibrate 160, flaxseed oil-without side effects  3.  Obesity class 2 -He did well in the past and a plant-based diet.  He is eating some meat but mostly stays on the diet -He lost a significant amount of weight in the last 3 years, with 5 pounds loss before last visit.  Since last visit, he lost another 3 pounds.  [name], MD PhD [facility] Endocrinology

## 2021-01-22 DIAGNOSIS — H52221 Regular astigmatism, right eye: Secondary | ICD-10-CM | POA: Diagnosis not present

## 2021-01-22 DIAGNOSIS — H524 Presbyopia: Secondary | ICD-10-CM | POA: Diagnosis not present

## 2021-01-29 ENCOUNTER — Other Ambulatory Visit: Payer: Self-pay | Admitting: Family Medicine

## 2021-02-14 ENCOUNTER — Telehealth: Payer: Medicare HMO

## 2021-02-14 NOTE — Progress Notes (Signed)
This encounter was created in error - please disregard.

## 2021-02-17 NOTE — Progress Notes (Signed)
HPI: FU CAD. Pt has had previous PCI of his LAD. Patient had admission October 2017 with NSTEMI. He had PCI of the circumflex and RCA with drug-eluting stents. Cardiac catheterization repeated May 2018because of dyspnea and chest pain. There was continued patency of stent segments in his RCA, left circumflex and LAD. Left ventricular end-diastolic pressure moderately elevated(23). Admitted with CHF and chest pain December 2019. Enzymes negative. Echo December 2019 showed normal LV function and mild left atrial enlargement. Nuclear study December 2019 showed no ischemia or infarction.ABIs 3/21 normal.  Abdominal ultrasound December 2021 showed 3 cm abdominal aortic aneurysm.  Since last seen, he denies dyspnea, chest pain, palpitations or syncope.  Current Outpatient Medications  Medication Sig Dispense Refill  . acetaminophen (TYLENOL) 500 MG tablet Take 500 mg by mouth 2 (two) times daily.     Marland Kitchen alfuzosin (UROXATRAL) 10 MG 24 hr tablet Take 10 mg by mouth daily with breakfast.    . allopurinol (ZYLOPRIM) 300 MG tablet TAKE 1 TABLET EVERY DAY 90 tablet 1  . aspirin EC 81 MG tablet Take 81 mg by mouth daily.    Marland Kitchen atorvastatin (LIPITOR) 40 MG tablet TAKE 1 TABLET EVERY DAY 90 tablet 1  . B Complex Vitamins (B COMPLEX-B12 PO) Take 1 tablet by mouth daily.     . Blood Glucose Monitoring Suppl (TRUE METRIX METER) w/Device KIT USE AS DIRECTED  TO TEST BLOOD GLUCOSE 1 kit 0  . Chlorpheniramine Maleate (CHLOR-TRIMETON ALLERGY PO) Take 1 tablet by mouth 2 (two) times daily as needed (allergies).    . Cholecalciferol (VITAMIN D3) 50 MCG (2000 UT) capsule Take 2,000 Units by mouth at bedtime.     . DROPLET INSULIN SYRINGE 31G X 5/16" 1 ML MISC USE TO INJECT INSULIN 5 TIMES DAILY 500 each 0  . fenofibrate 160 MG tablet TAKE 1 TABLET (160 MG TOTAL) BY MOUTH AT BEDTIME. 90 tablet 1  . fluocinonide cream (LIDEX) 9.23 % Apply 1 application topically daily as needed (for rashes).     . fluticasone  (FLONASE) 50 MCG/ACT nasal spray Place 2 sprays into both nostrils daily. (Patient taking differently: Place 2 sprays into both nostrils daily as needed for allergies.) 16 g 1  . furosemide (LASIX) 40 MG tablet TAKE 2 TABLETS (80 MG TOTAL) BY MOUTH DAILY. (Patient taking differently: Take 80 mg by mouth. Every other morning) 180 tablet 3  . HYDROcodone-acetaminophen (NORCO) 5-325 MG tablet Take 0.5-1 tablets by mouth at bedtime as needed for moderate pain. 30 tablet 0  . insulin NPH Human (NOVOLIN N) 100 UNIT/ML injection Inject 0.5 mLs (50 Units total) into the skin 2 (two) times daily before a meal. (Patient taking differently: Inject 40 Units into the skin 2 (two) times daily before a meal.) 10 mL 11  . insulin regular (NOVOLIN R RELION) 100 units/mL injection Inject 25-30 units 3 times a day before meals. 60 mL 1  . Insulin Syringe-Needle U-100 (BD INSULIN SYRINGE U/F) 31G X 5/16" 0.3 ML MISC Use to inject insulin 5 times a day. 500 each 11  . isosorbide mononitrate (IMDUR) 30 MG 24 hr tablet TAKE 1 TABLET (30 MG TOTAL) BY MOUTH DAILY. 90 tablet 3  . MAGNESIUM PO Take 1 tablet by mouth at bedtime.    . Melatonin 10 MG TABS Take 10 mg by mouth at bedtime.    . metoprolol tartrate (LOPRESSOR) 25 MG tablet Take 1 tablet (25 mg total) by mouth 2 (two) times daily. (Patient taking  differently: Take 12.5 mg by mouth 2 (two) times daily.) 180 tablet 1  . nitroGLYCERIN (NITROSTAT) 0.4 MG SL tablet Place 1 tablet (0.4 mg total) under the tongue every 5 (five) minutes as needed for chest pain. 100 tablet 3  . pantoprazole (PROTONIX) 20 MG tablet TAKE 1 TABLET EVERY DAY 90 tablet 3  . Probiotic Product (PROBIOTIC PO) Take 1 capsule by mouth at bedtime.     Marland Kitchen tiZANidine (ZANAFLEX) 4 MG tablet Take 1 tablet (4 mg total) by mouth at bedtime as needed for muscle spasms. 30 tablet 5  . TRUE METRIX BLOOD GLUCOSE TEST test strip TEST BLOOD SUGAR THREE TIMES DAILY AS DIRECTED 300 strip 11  . TRUEplus Lancets 33G  MISC USE TO CHECK BLOOD SUGAR 3 TIMES A DAY 300 each 3   No current facility-administered medications for this visit.     Past Medical History:  Diagnosis Date  . AAA (abdominal aortic aneurysm) ([place]) 12/15/2014   a. Mild aneurysmal dilatation of the infrarenal abdominal aorta 3.2 cm - f/u due by 2019.  . Arthritis    "all over" (07/30/2016)  . CAD (coronary artery disease)    a. stent to LAD 2000. b. possible spasm by cath 2001. c. IVUS/PTCA/DES to mLAD 05/2013. d. PTCA/DES of dRCA into ostial rPDA 07/2013. e. PTCA of OM2, DES to Cx, DES to [facility] 07/2016.  Marland Kitchen Chronic bronchitis ([place])   . Chronic diastolic CHF (congestive heart failure) ([place])   . Chronic lower back pain   . Chronic neck pain   . CKD (chronic kidney disease), stage III ([place])   . Diabetic peripheral neuropathy ([place])   . Ejection fraction    55%, 07/2010, mild inferior hypo  . GERD (gastroesophageal reflux disease)   . Gout   . H/O diverticulitis of colon 01/31/2015  . H/O hiatal hernia   . History of blood transfusion ~ 10/1940   "had pneumonia"  . History of kidney stones   . HTN (hypertension)   . Hyperlipidemia   . Melanoma of forearm, left ([place]) 02/2011   with wide excision   . Myocardial infarction ([place])   . Nephrolithiasis   . Obesity   . OSA on CPAP     [name] since 2000  . Peripheral neuropathy    both feet  . Positive D-dimer    a. significant elevation ,hospital 07/2010, etiology unclear. b. D Dimer chronically > 20.  Marland Kitchen Renal insufficiency   . Skin cancer   . Spinal stenosis    a. s/p surgical repair 2013  . Spinal stenosis of lumbar region   . Type II diabetes mellitus ([place])   . Ventral hernia     Past Surgical History:  Procedure Laterality Date  . ANTERIOR LAT LUMBAR FUSION  10/22/2017   Procedure: Lumbar two-three Lumbar three-four Lumbar four-five Anteriolateral lumbar interbody fusion with percutaneous pedicle screw fixation and infuse;  Surgeon: [name], MD;  Location: [place];   Service: Neurosurgery;;  . APPLICATION OF ROBOTIC ASSISTANCE FOR SPINAL PROCEDURE  10/22/2017   Procedure: APPLICATION OF ROBOTIC ASSISTANCE FOR SPINAL PROCEDURE;  Surgeon: [name], MD;  Location: [place];  Service: Neurosurgery;;  . BACK SURGERY    . CARDIAC CATHETERIZATION N/A 07/30/2016   Procedure: Left Heart Cath and Coronary Angiography;  Surgeon: Nelva Bush, MD;  Location: [place] CV LAB;  Service: Cardiovascular;  Laterality: N/A;  . CARDIAC CATHETERIZATION N/A 07/30/2016   Procedure: Coronary Stent Intervention;  Surgeon: Nelva Bush, MD;  Location: [facility]  INVASIVE CV LAB;  Service: Cardiovascular;  Laterality: N/A;  Mid CFX and MID RCA  . CARDIAC CATHETERIZATION N/A 07/30/2016   Procedure: Coronary Balloon Angioplasty;  Surgeon: Nelva Bush, MD;  Location: [place] CV LAB;  Service: Cardiovascular;  Laterality: N/A;  OM 1  . CARPAL TUNNEL RELEASE Left   . CATARACT EXTRACTION W/ INTRAOCULAR LENS  IMPLANT, BILATERAL Bilateral ~ 2014  . CERVICAL DISC SURGERY  1990s  . CORONARY ANGIOPLASTY WITH STENT PLACEMENT  05/2013  . CORONARY ANGIOPLASTY WITH STENT PLACEMENT  07/26/2013   DES to RCA extending to PDA    . CORONARY ANGIOPLASTY WITH STENT PLACEMENT  07/30/2016  . CORONARY ANGIOPLASTY WITH STENT PLACEMENT  2000   CAD  . DENTAL SURGERY  04/2016   "got infected; had to dig it out"  . EYE SURGERY    . KNEE ARTHROSCOPY Right 1990's   rt  . LEFT AND RIGHT HEART CATHETERIZATION WITH CORONARY ANGIOGRAM N/A 07/26/2013   Procedure: LEFT AND RIGHT HEART CATHETERIZATION WITH CORONARY ANGIOGRAM;  Surgeon: [name], MD;  Location: [place] CATH LAB;  Service: Cardiovascular;  Laterality: N/A;  . LEFT HEART CATH AND CORONARY ANGIOGRAPHY N/A 02/26/2017   Procedure: Left Heart Cath and Coronary Angiography;  Surgeon: Sherren Mocha, MD;  Location: [place] CV LAB;  Service: Cardiovascular;  Laterality: N/A;  . LEFT HEART CATHETERIZATION WITH CORONARY ANGIOGRAM N/A 05/19/2013    Procedure: LEFT HEART CATHETERIZATION WITH CORONARY ANGIOGRAM;  Surgeon: [name], MD;  Location: [facility] CATH LAB;  Service: Cardiovascular;  Laterality: N/A;  . LUMBAR LAMINECTOMY/DECOMPRESSION MICRODISCECTOMY  08/30/2012   Procedure: LUMBAR LAMINECTOMY/DECOMPRESSION MICRODISCECTOMY 2 LEVELS;  Surgeon: [name], MD;  Location: [place] NEURO ORS;  Service: Neurosurgery;  Laterality: Bilateral;  Bilateral Lumbar three-four Lumbar four-five Laminotomies  . LUMBAR PERCUTANEOUS PEDICLE SCREW 1 LEVEL Bilateral 10/22/2017   Procedure: LUMBAR PERCUTANEOUS PEDICLE SCREW 1 LEVEL;  Surgeon: [name], MD;  Location: [place];  Service: Neurosurgery;  Laterality: Bilateral;  . LUMBAR SPINE SURGERY  04/07/2016   Dr. Ellene Route; "?ruptured disc"  . MELANOMA EXCISION Left 02/2011   forearm  . NASAL SINUS SURGERY  1970s   "cut windows in sinus pockets"  . PERCUTANEOUS CORONARY STENT INTERVENTION (PCI-S)  05/19/2013   Procedure: PERCUTANEOUS CORONARY STENT INTERVENTION (PCI-S);  Surgeon: [name], MD;  Location: [facility] CATH LAB;  Service: Cardiovascular;;  . ULNAR TUNNEL RELEASE Left 04/07/2016   Dr. Ellene Route    Social History   Socioeconomic History  . Marital status: Married    Spouse name: Not on file  . Number of children: Not on file  . Years of education: Not on file  . Highest education level: Not on file  Occupational History  . Occupation: Painter  Tobacco Use  . Smoking status: Former Smoker    Packs/day: 3.00    Years: 30.00    Pack years: 90.00    Types: Cigarettes    Quit date: 09/17/1996    Years since quitting: 24.4  . Smokeless tobacco: Never Used  Vaping Use  . Vaping Use: Never used  Substance and Sexual Activity  . Alcohol use: No  . Drug use: No  . Sexual activity: Not on file  Other Topics Concern  . Not on file  Social History Narrative   Married and lives locally with his wife.  Sharyon Cable.   Social Determinants of Health   Financial Resource Strain: Low Risk   .  Difficulty of Paying Living Expenses: Not very hard  Food  Insecurity: Not on file  Transportation Needs: Not on file  Physical Activity: Inactive  . Days of Exercise per Week: 0 days  . Minutes of Exercise per Session: 0 min  Stress: Not on file  Social Connections: Not on file  Intimate Partner Violence: Not on file    Family History  Problem Relation Age of Onset  . Cancer Mother        intestinal   . Heart disease Mother   . Cancer Father        prostate  . Migraines Daughter   . Leukemia Sister   . Heart disease Sister   . Prostate cancer Other   . Kidney cancer Other   . Cancer Other        Bladder cancer  . Coronary artery disease Other   . Hypertension Sister   . Hypertension Brother   . Heart attack Neg Hx   . Stroke Neg Hx     ROS: Back pain but no fevers or chills, productive cough, hemoptysis, dysphasia, odynophagia, melena, hematochezia, dysuria, hematuria, rash, seizure activity, orthopnea, PND, pedal edema, claudication. Remaining systems are negative.  Physical Exam: Well-developed well-nourished in no acute distress.  Skin is warm and dry.  HEENT is normal.  Neck is supple.  Chest is clear to auscultation with normal expansion.  Cardiovascular exam is regular rate and rhythm.  Abdominal exam nontender or distended. No masses palpated. Extremities show no edema. neuro grossly intact  A/P  1 coronary artery disease-patient doing well with no chest pain.  Continue aspirin and statin.  2 abdominal aortic aneurysm-plan follow-up ultrasound December 2022.  3 hypertension-blood pressure has been elevated at home.  Add amlodipine 5 mg daily and follow.  4 hyperlipidemia-continue statin.  5 chronic diastolic congestive heart failure-he is euvolemic today.  Continue diuretics.  Check potassium and renal function.  Kirk Ruths, MD

## 2021-02-19 ENCOUNTER — Telehealth: Payer: Medicare HMO

## 2021-02-20 ENCOUNTER — Other Ambulatory Visit: Payer: Self-pay

## 2021-02-20 ENCOUNTER — Ambulatory Visit: Payer: Medicare HMO | Admitting: Orthopaedic Surgery

## 2021-02-20 ENCOUNTER — Encounter: Payer: Self-pay | Admitting: Orthopaedic Surgery

## 2021-02-20 ENCOUNTER — Ambulatory Visit (INDEPENDENT_AMBULATORY_CARE_PROVIDER_SITE_OTHER): Payer: Medicare HMO

## 2021-02-20 ENCOUNTER — Telehealth: Payer: Self-pay | Admitting: Pharmacist

## 2021-02-20 DIAGNOSIS — M5416 Radiculopathy, lumbar region: Secondary | ICD-10-CM | POA: Diagnosis not present

## 2021-02-20 DIAGNOSIS — G8929 Other chronic pain: Secondary | ICD-10-CM

## 2021-02-20 DIAGNOSIS — M5441 Lumbago with sciatica, right side: Secondary | ICD-10-CM

## 2021-02-20 DIAGNOSIS — M5442 Lumbago with sciatica, left side: Secondary | ICD-10-CM | POA: Diagnosis not present

## 2021-02-20 DIAGNOSIS — M25552 Pain in left hip: Secondary | ICD-10-CM

## 2021-02-20 NOTE — Telephone Encounter (Signed)
Attempted outreach to patient x 3 for CCM follow up and medication management.  Unable to reach patient. LM on VM each time; no return call.   Will plan outreach again in 1 week.

## 2021-02-20 NOTE — Progress Notes (Signed)
Office Visit Note   Patient: William Hendrix           Date of Birth: 09-06-1941           MRN: [id number] Visit Date: 02/20/2021              Requested by: [name], MD [address] [place],  [place] [id number] PCP: [name], MD   Assessment & Plan: Visit Diagnoses:  1. Pain in left hip   2. Chronic left-sided low back pain with left-sided sciatica   3. Lumbar radiculopathy   4. Chronic right-sided low back pain with right-sided sciatica     Plan: William Hendrix has a chronic history of low back pain treated by Dr. Ellene Route.  He has had a prior fusion from L2-L5.  Films reveal excellent position of the fusion instrumentation.  He has developed significant degenerative change at L1-2 and L5-S1 that I suspect is causing him most of his present problem.  He has been experiencing diffuse pain along the lower lumbar spine and into his left buttock.  He has also on occasion had some pain along the lateral aspect of the left hip and left groin.  He did not have any significant degenerative changes of his left hip by plain film today.  He might have some localized IT band pain or greater trochanteric bursitis.  It was not that significant today.  Long discussion regarding the origin of his pain which I believe is related to his back.  He will return to see Dr. Ellene Route.  He did receive some type of an injection in his back within the last several months through his office.  He also relates its been several years since he had an MRI scan of his lumbar spine  Follow-Up Instructions: Return if symptoms worsen or fail to improve.   Orders:  Orders Placed This Encounter  Procedures  . XR HIP UNILAT W OR W/O PELVIS 2-3 VIEWS LEFT  . XR Lumbar Spine 2-3 Views   No orders of the defined types were placed in this encounter.     Procedures: No procedures performed   Clinical Data: No additional findings.   Subjective: Chief Complaint  Patient presents with  . Left  Hip - Pain  William Hendrix has had a chronic problem with his lumbar spine treated in the past by Dr. Ellene Route with L2-L5 fusion.  He has been experiencing some pain in his left hip and he came to the office today for further evaluation.  Majority of his pain appears to be in the lower lumbar spine with occasional referred pain to the lateral aspect of his left hip.  He has had some numbness and tingling but mostly just pain.  Not much pain distal to the right or left thigh.  No recent injury or trauma.  He is also a chronic problems with his neck and a year ago had localized fusion by Dr. Ellene Route in the lower cervical spine. Also has a history of neuropathy involving both upper and lower extremities  HPI  Review of Systems   Objective: Vital Signs: There were no vitals taken for this visit.  Physical Exam Constitutional:      Appearance: He is well-developed.  Eyes:     Pupils: Pupils are equal, round, and reactive to light.  Pulmonary:     Effort: Pulmonary effort is normal.  Skin:    General: Skin is warm and dry.  Neurological:  Mental Status: He is alert and oriented to person, place, and time.  Psychiatric:        Behavior: Behavior normal.     Ortho Exam awake alert and oriented x3.  Comfortable sitting.  Walks with a very stiff back.  Hamstrings are tight.  No pain with internal and external rotation of either hip and no loss of motion of right compared to the left hip.  Minimal discomfort over the lateral aspect of his left hip and none on the right.  Skin intact.  Specialty Comments:  No specialty comments available.  Imaging: XR HIP UNILAT W OR W/O PELVIS 2-3 VIEWS LEFT  Result Date: 02/20/2021 AP pelvis and lateral left hip were obtained demonstrating very minimal degenerative changes in the left hip.  Joint spaces relatively well-maintained comparing right to left.  No obvious pathology about the greater trochanter.  No ectopic calcification.  XR Lumbar Spine 2-3  Views  Result Date: 02/20/2021 Several views of the lumbar spine were obtained demonstrating prior fusion from L2-3, L3-4 and L4-5.  There are degenerative changes at L1-2 and L5-S1.  No listhesis.  The disc spaces are narrowed at L1-2 and L5 1 does show with facet arthritis.  At L1-2 there is anterior ligament calcification.  Diffuse calcification of the abdominal aorta    PMFS History: Patient Active Problem List   Diagnosis Date Noted  . Dysphagia 01/10/2020  . Lyme disease 05/04/2019  . Chronic renal failure, stage 3 (moderate) ([place]) 11/12/2018  . Chronic diastolic CHF (congestive heart failure) ([place]) 11/12/2018  . Overactive bladder 10/31/2018  . Acute diastolic CHF (congestive heart failure) ([place])   . Chest pain 09/15/2018  . UTI (urinary tract infection) 08/01/2018  . Muscle cramps 06/13/2018  . Urinary incontinence 05/01/2018  . Anemia 11/07/2017  . Constipation 11/07/2017  . Lumbar stenosis with neurogenic claudication 10/22/2017  . Back pain 08/29/2017  . Right-sided low back pain with right-sided sciatica 11/22/2016  . Dyspnea 08/01/2016  . NSTEMI (non-ST elevated myocardial infarction) ([place]) 07/30/2016  . CAD in native artery   . Obesity   . Controlled diabetes mellitus type 2 with complications ([place]) 40/07/2724  . Lumbar radiculopathy 10/09/2015  . Neuropathy, diabetic ([place]) 05/22/2015  . H/O diverticulitis of colon 01/31/2015  . AAA (abdominal aortic aneurysm) ([place]) 01/01/2015  . Sinusitis 10/10/2014  . Right hip pain 07/05/2014  . Palpitations 02/27/2014  . Claudication of lower extremity ([place]) 10/25/2013  . BPH with obstruction/lower urinary tract symptoms 10/25/2013  . Hypertriglyceridemia 08/15/2012  . Melanoma ([place])   . Dizziness   . Renal insufficiency   . Coronary artery disease with exertional angina ([place])   . Hyperlipidemia   . Essential hypertension   . OSA (obstructive sleep apnea)   . Ejection fraction   . SOB (shortness of breath)   .  PARESTHESIA 05/30/2009  . EDEMA 01/22/2009  . OTHER ABNORMAL BLOOD CHEMISTRY 04/25/2008  . Gout 10/21/2007  . Depression 10/21/2007  . VENTRAL HERNIA 10/21/2007  . HIATAL HERNIA 10/21/2007  . FATIGUE 10/21/2007  . SKIN CANCER, HX OF 10/21/2007  . NEPHROLITHIASIS, HX OF 10/21/2007   Past Medical History:  Diagnosis Date  . AAA (abdominal aortic aneurysm) ([place]) 12/15/2014   a. Mild aneurysmal dilatation of the infrarenal abdominal aorta 3.2 cm - f/u due by 2019.  . Arthritis    "all over" (07/30/2016)  . CAD (coronary artery disease)    a. stent to LAD 2000. b. possible spasm by cath 2001. c. IVUS/PTCA/DES to mLAD  05/2013. d. PTCA/DES of dRCA into ostial rPDA 07/2013. e. PTCA of OM2, DES to Cx, DES to [facility] 07/2016.  Marland Kitchen Chronic bronchitis ([place])   . Chronic diastolic CHF (congestive heart failure) ([place])   . Chronic lower back pain   . Chronic neck pain   . CKD (chronic kidney disease), stage III ([place])   . Diabetic peripheral neuropathy ([place])   . Ejection fraction    55%, 07/2010, mild inferior hypo  . GERD (gastroesophageal reflux disease)   . Gout   . H/O diverticulitis of colon 01/31/2015  . H/O hiatal hernia   . History of blood transfusion ~ 10/1940   "had pneumonia"  . History of kidney stones   . HTN (hypertension)   . Hyperlipidemia   . Melanoma of forearm, left ([place]) 02/2011   with wide excision   . Myocardial infarction ([place])   . Nephrolithiasis   . Obesity   . OSA on CPAP     [name] since 2000  . Peripheral neuropathy    both feet  . Positive D-dimer    a. significant elevation ,hospital 07/2010, etiology unclear. b. D Dimer chronically > 20.  Marland Kitchen Renal insufficiency   . Skin cancer   . Spinal stenosis    a. s/p surgical repair 2013  . Spinal stenosis of lumbar region   . Type II diabetes mellitus ([place])   . Ventral hernia     Family History  Problem Relation Age of Onset  . Cancer Mother        intestinal   . Heart disease Mother   . Cancer Father         prostate  . Migraines Daughter   . Leukemia Sister   . Heart disease Sister   . Prostate cancer Other   . Kidney cancer Other   . Cancer Other        Bladder cancer  . Coronary artery disease Other   . Hypertension Sister   . Hypertension Brother   . Heart attack Neg Hx   . Stroke Neg Hx     Past Surgical History:  Procedure Laterality Date  . ANTERIOR LAT LUMBAR FUSION  10/22/2017   Procedure: Lumbar two-three Lumbar three-four Lumbar four-five Anteriolateral lumbar interbody fusion with percutaneous pedicle screw fixation and infuse;  Surgeon: [name], MD;  Location: [facility];  Service: Neurosurgery;;  . APPLICATION OF ROBOTIC ASSISTANCE FOR SPINAL PROCEDURE  10/22/2017   Procedure: APPLICATION OF ROBOTIC ASSISTANCE FOR SPINAL PROCEDURE;  Surgeon: [name], MD;  Location: [facility];  Service: Neurosurgery;;  . BACK SURGERY    . CARDIAC CATHETERIZATION N/A 07/30/2016   Procedure: Left Heart Cath and Coronary Angiography;  Surgeon: Nelva Bush, MD;  Location: [place] CV LAB;  Service: Cardiovascular;  Laterality: N/A;  . CARDIAC CATHETERIZATION N/A 07/30/2016   Procedure: Coronary Stent Intervention;  Surgeon: Nelva Bush, MD;  Location: [place] [facility] CV LAB;  Service: Cardiovascular;  Laterality: N/A;  Mid CFX and MID RCA  . CARDIAC CATHETERIZATION N/A 07/30/2016   Procedure: Coronary Balloon Angioplasty;  Surgeon: Nelva Bush, MD;  Location: [facility] CV LAB;  Service: Cardiovascular;  Laterality: N/A;  OM 1  . CARPAL TUNNEL RELEASE Left   . CATARACT EXTRACTION W/ INTRAOCULAR LENS  IMPLANT, BILATERAL Bilateral ~ 2014  . CERVICAL DISC SURGERY  1990s  . CORONARY ANGIOPLASTY WITH STENT PLACEMENT  05/2013  . CORONARY ANGIOPLASTY WITH STENT PLACEMENT  07/26/2013   DES to RCA extending to PDA    .  CORONARY ANGIOPLASTY WITH STENT PLACEMENT  07/30/2016  . CORONARY ANGIOPLASTY WITH STENT PLACEMENT  2000   CAD  . DENTAL SURGERY  04/2016   "got infected; had to dig it  out"  . EYE SURGERY    . KNEE ARTHROSCOPY Right 1990's   rt  . LEFT AND RIGHT HEART CATHETERIZATION WITH CORONARY ANGIOGRAM N/A 07/26/2013   Procedure: LEFT AND RIGHT HEART CATHETERIZATION WITH CORONARY ANGIOGRAM;  Surgeon: [name], MD;  Location: [facility] CATH LAB;  Service: Cardiovascular;  Laterality: N/A;  . LEFT HEART CATH AND CORONARY ANGIOGRAPHY N/A 02/26/2017   Procedure: Left Heart Cath and Coronary Angiography;  Surgeon: Sherren Mocha, MD;  Location: [place] CV LAB;  Service: Cardiovascular;  Laterality: N/A;  . LEFT HEART CATHETERIZATION WITH CORONARY ANGIOGRAM N/A 05/19/2013   Procedure: LEFT HEART CATHETERIZATION WITH CORONARY ANGIOGRAM;  Surgeon: [name], MD;  Location: [facility] CATH LAB;  Service: Cardiovascular;  Laterality: N/A;  . LUMBAR LAMINECTOMY/DECOMPRESSION MICRODISCECTOMY  08/30/2012   Procedure: LUMBAR LAMINECTOMY/DECOMPRESSION MICRODISCECTOMY 2 LEVELS;  Surgeon: [name], MD;  Location: [place] NEURO ORS;  Service: Neurosurgery;  Laterality: Bilateral;  Bilateral Lumbar three-four Lumbar four-five Laminotomies  . LUMBAR PERCUTANEOUS PEDICLE SCREW 1 LEVEL Bilateral 10/22/2017   Procedure: LUMBAR PERCUTANEOUS PEDICLE SCREW 1 LEVEL;  Surgeon: [name], MD;  Location: [facility];  Service: Neurosurgery;  Laterality: Bilateral;  . LUMBAR SPINE SURGERY  04/07/2016   Dr. Ellene Route; "?ruptured disc"  . MELANOMA EXCISION Left 02/2011   forearm  . NASAL SINUS SURGERY  1970s   "cut windows in sinus pockets"  . PERCUTANEOUS CORONARY STENT INTERVENTION (PCI-S)  05/19/2013   Procedure: PERCUTANEOUS CORONARY STENT INTERVENTION (PCI-S);  Surgeon: [name], MD;  Location[facility] CATH LAB;  Service: Cardiovascular;;  . ULNAR TUNNEL RELEASE Left 04/07/2016   Dr. Ellene Route   Social History   Occupational History  . Occupation: Painter  Tobacco Use  . Smoking status: Former Smoker    Packs/day: 3.00    Years: 30.00    Pack years: 90.00    Types: Cigarettes    Quit date:  09/17/1996    Years since quitting: 24.4  . Smokeless tobacco: Never Used  Vaping Use  . Vaping Use: Never used  Substance and Sexual Activity  . Alcohol use: No  . Drug use: No  . Sexual activity: Not on file     [name], MD   Note - This record has been created using [employer].  Chart creation errors have been sought, but may not always  have been located. Such creation errors do not reflect on  the standard of medical care.

## 2021-02-21 ENCOUNTER — Ambulatory Visit (INDEPENDENT_AMBULATORY_CARE_PROVIDER_SITE_OTHER): Payer: Medicare HMO | Admitting: Pharmacist

## 2021-02-21 DIAGNOSIS — N401 Enlarged prostate with lower urinary tract symptoms: Secondary | ICD-10-CM

## 2021-02-21 DIAGNOSIS — E118 Type 2 diabetes mellitus with unspecified complications: Secondary | ICD-10-CM | POA: Diagnosis not present

## 2021-02-21 DIAGNOSIS — N289 Disorder of kidney and ureter, unspecified: Secondary | ICD-10-CM

## 2021-02-21 DIAGNOSIS — N138 Other obstructive and reflux uropathy: Secondary | ICD-10-CM

## 2021-02-21 DIAGNOSIS — I10 Essential (primary) hypertension: Secondary | ICD-10-CM

## 2021-02-21 DIAGNOSIS — E781 Pure hyperglyceridemia: Secondary | ICD-10-CM | POA: Diagnosis not present

## 2021-02-21 DIAGNOSIS — M5416 Radiculopathy, lumbar region: Secondary | ICD-10-CM

## 2021-02-21 DIAGNOSIS — Z794 Long term (current) use of insulin: Secondary | ICD-10-CM

## 2021-02-21 DIAGNOSIS — M1000 Idiopathic gout, unspecified site: Secondary | ICD-10-CM

## 2021-02-21 DIAGNOSIS — Z6837 Body mass index (BMI) 37.0-37.9, adult: Secondary | ICD-10-CM

## 2021-02-21 NOTE — Chronic Care Management (AMB) (Signed)
Chronic Care Management Pharmacy Note  02/21/2021 Name:  William Hendrix MRN:  [id number] DOB:  07/05/1941  Subjective: William Hendrix is an 80 y.o. year old male who is a primary patient of [name], MD.  The CCM team was consulted for assistance with disease management and care coordination needs.    Engaged with patient by telephone for follow up visit in response to provider referral for pharmacy case management and/or care coordination services.   Consent to Services:  The patient was given information about Chronic Care Management services, agreed to services, and gave verbal consent prior to initiation of services.  Please see initial visit note for detailed documentation.   Patient Care Team: [name], MD as PCP - General (Family Medicine) Stanford Breed Denice Bors, MD as PCP - Cardiology (Cardiology) [name], MD (Neurosurgery) Lelon Perla, MD as Consulting Physician (Cardiology) Rutherford Guys, MD as Consulting Physician (Ophthalmology) [name], MD as Consulting Physician (Internal Medicine) Cherre Robins, PharmD (Pharmacist)  Recent office visits: 09/24/2020 - PCP (Dr Charlett Blake); f/u chronic medical concerns. Decreased dose of hydrocodone/APAP from 10/373m to 5/3223m- take 1/2 to 1 tablet at bedtime as needed; changed tizanidine 61m22mrom bid to qhs prn.  Recent consult visits: 02/20/2021 - Ortho (Dr WhiDurward FortesSeen for pain in left hip and chronic low back pain. "Films reveal excellent position of the fusion instrumentation.  He has developed significant degenerative change at L1-2 and L5-S1 that I suspect is causing him most of his present problem. He did not have any significant degenerative changes of his left hip by plain film today.  He might have some localized IT band pain or greater trochanteric bursitis.  It was not that significant today.  Long discussion regarding the origin of his pain which I believe is related to his back.  He will  return to see Dr. ElsEllene Routeo medication changes made.   01/14/2021 - Endo (Dr GheCruzita LedererThroughout the day, sugars are higher than goal and upon questioning, he is not taking the regular insulin if the sugars are lower than 120.  At this visit, we discussed that dosing the regular insulin should not be based on an organizing  Hospital visits: None in previous 6 months  Objective:  Lab Results  Component Value Date   CREATININE 1.51 (H) 08/28/2020   CREATININE 1.58 (H) 01/09/2020   CREATININE 1.54 (H) 12/13/2019    Lab Results  Component Value Date   HGBA1C 6.7 (A) 01/14/2021   Last diabetic Eye exam:  Lab Results  Component Value Date/Time   HMDIABEYEEXA No Retinopathy 09/30/2015 12:00 AM    Last diabetic Foot exam: No results found for: HMDIABFOOTEX      Component Value Date/Time   CHOL 128 08/28/2020 1123   TRIG 240 (H) 08/28/2020 1123   TRIG 203 (HH) 09/17/2006 0812   HDL 25 (L) 08/28/2020 1123   CHOLHDL 5.1 (H) 08/28/2020 1123   CHOLHDL 5 11/21/2019 0824   VLDL 50.0 (H) 11/21/2019 0824   LDLCALC 64 08/28/2020 1123   LDLDIRECT 57.0 11/21/2019 0824    Hepatic Function Latest Ref Rng & Units 08/28/2020 01/09/2020 11/21/2019  Total Protein 6.0 - 8.5 g/dL 7.1 6.5 7.0  Albumin 3.7 - 4.7 g/dL 4.5 3.8 4.2  AST 0 - 40 IU/L '20 23 22  ' ALT 0 - 44 IU/L '19 19 23  ' Alk Phosphatase 44 - 121 IU/L 62 51 61  Total Bilirubin 0.0 - 1.2 mg/dL 0.4 0.5 0.6  Bilirubin, Direct 0.0 - 0.3 mg/dL - - -    Lab Results  Component Value Date/Time   TSH 4.59 (H) 11/21/2019 08:24 AM   TSH 3.83 08/14/2019 08:12 AM   FREET4 0.89 11/21/2019 03:54 PM    CBC Latest Ref Rng & Units 01/09/2020 11/21/2019 08/14/2019  WBC 4.0 - 10.5 K/uL 8.2 5.9 6.2  Hemoglobin 13.0 - 17.0 g/dL 12.4(L) 13.6 13.4  Hematocrit 39.0 - 52.0 % 38.1(L) 40.6 40.2  Platelets 150 - 400 K/uL 170 194.0 194.0    Lab Results  Component Value Date/Time   VD25OH 40.18 01/25/2018 04:24 PM   VD25OH 27 (L) 05/30/2009 08:19 PM     Clinical ASCVD: Yes  The ASCVD Risk score [name], et al., 2013) failed to calculate for the following reasons:   The 2013 ASCVD risk score is only valid for ages 11 to 86   The patient has a prior MI or stroke diagnosis    Other: (CHADS2VASc if Afib, PHQ9 if depression, MMRC or CAT for COPD, ACT, DEXA)  Social History   Tobacco Use  Smoking Status Former Smoker  . Packs/day: 3.00  . Years: 30.00  . Pack years: 90.00  . Types: Cigarettes  . Quit date: 09/17/1996  . Years since quitting: 24.4  Smokeless Tobacco Never Used   BP Readings from Last 3 Encounters:  01/14/21 120/60  10/14/20 (!) 144/73  09/24/20 122/80   Pulse Readings from Last 3 Encounters:  01/14/21 74  10/14/20 80  09/24/20 80   Wt Readings from Last 3 Encounters:  01/14/21 243 lb 4.8 oz (110.4 kg)  10/14/20 247 lb (112 kg)  09/24/20 246 lb 11.2 oz (111.9 kg)    Assessment: Review of patient past medical history, allergies, medications, health status, including review of consultants reports, laboratory and other test data, was performed as part of comprehensive evaluation and provision of chronic care management services.   SDOH:  (Social Determinants of Health) assessments and interventions performed:    CCM Care Plan  Allergies  Allergen Reactions  . Brilinta [Ticagrelor] Shortness Of Breath  . Insulin Detemir Swelling, Other (See Comments) and Itching    Patient had redness and swelling and tenderness at injection site. Patient had redness and swelling and tenderness at injection site.  . Bee Venom   . Codeine Other (See Comments)    Makes him "shakey", is OK with hydrocodone GI UPSET & TREMORS Makes him "shakey", is OK with hydrocodone Makes him "shakey", is OK with hydrocodone GI UPSET & TREMORS  . Lipitor [Atorvastatin] Other (See Comments)    Muscle aches; affects liver function- Patient is currently taking  . Novolog Mix [Insulin Aspart Prot & Aspart] Other (See Comments)     Causes skin to swell/itch at injection site    Medications Reviewed Today    Reviewed by [name], MD (Physician) on 02/20/21 at 1544  Med List Status: <None>  Medication Order Taking? Sig Documenting Provider Last Dose Status Informant  acetaminophen (TYLENOL) 500 MG tablet [id number] No Take 500 mg by mouth 2 (two) times daily.  [provider] Taking Active Self  alfuzosin (UROXATRAL) 10 MG 24 hr tablet [id number] No Take 10 mg by mouth daily with breakfast. [provider] Taking Active   allopurinol (ZYLOPRIM) 300 MG tablet [id number] No TAKE 1 TABLET EVERY DAY [name], MD Taking Active   aspirin EC 81 MG tablet [id number] No Take 81 mg by mouth daily. [name], MD Taking Active  Self           Med Note Andria Rhein Mar 18, 2020  9:40 AM) Last dose was 03/14/20  atorvastatin (LIPITOR) 40 MG tablet [id number] No TAKE 1 TABLET EVERY DAY [name], MD Taking Active   azithromycin (ZITHROMAX Z-PAK) 250 MG tablet [id number] No Take 1 tablet (250 mg total) by mouth daily. 2 tabs today, then 1 tab daily x 4 more days. Dutch Quint B, FNP Taking Active   B Complex Vitamins (B COMPLEX-B12 PO) [id number] No Take 1 tablet by mouth daily.  [provider] Taking Active Self           Med Note Luana Shu, MONCHELL R   Thu Jul 30, 2016  2:08 PM)    benzonatate (TESSALON) 200 MG capsule [id number] No Take 1 capsule (200 mg total) by mouth 3 (three) times daily as needed for cough. Kennyth Arnold, FNP Taking Active   Blood Glucose Monitoring Suppl (TRUE METRIX METER) w/Device KIT [id number] No USE AS DIRECTED  TO TEST BLOOD GLUCOSE [name], MD Taking Active   cefUROXime (CEFTIN) 500 MG tablet [id number] No Take 1 tablet (500 mg total) by mouth 2 (two) times daily with a meal. Debbrah Alar, NP Taking Active   Chlorpheniramine Maleate (CHLOR-TRIMETON ALLERGY PO) [id number] No Take 1 tablet by mouth every 6 (six) hours as needed (allergies).  [provider] Taking Active Self  Cholecalciferol (VITAMIN D3) 50 MCG (2000 UT) capsule [id number] No Take 2,000 Units by mouth at bedtime.  [provider] Taking Active Self  DROPLET INSULIN SYRINGE 31G X 5/16" 1 ML MISC [id number] No USE TO INJECT INSULIN 5 TIMES DAILY [name], MD Taking Active   fenofibrate 160 MG tablet [id number] No TAKE 1 TABLET (160 MG TOTAL) BY MOUTH AT BEDTIME. [name], MD Taking Active   fluocinonide cream (LIDEX) 0.05 % [id number] No Apply 1 application topically daily as needed (for rashes).  [provider] Taking Active Self  fluticasone (FLONASE) 50 MCG/ACT nasal spray [id number] No Place 2 sprays into both nostrils daily.  Patient taking differently: Place 2 sprays into both nostrils daily as needed for allergies.   Saguier, Percell Miller, PA-C Taking Active            Med Note [place]   Tue Aug 13, 2020 11:58 AM) Uses once every 2-3 months; symptoms from CPAP  furosemide (LASIX) 40 MG tablet [id number] No TAKE 2 TABLETS (80 MG TOTAL) BY MOUTH DAILY. Lelon Perla, MD Taking Active   HYDROcodone-acetaminophen [facility]) 5-325 MG tablet [id number] No Take 0.5-1 tablets by mouth at bedtime as needed for moderate pain. [name], MD Taking Active   insulin NPH Human (NOVOLIN N) 100 UNIT/ML injection [id number] No Inject 0.5 mLs (50 Units total) into the skin 2 (two) times daily before a meal. [name], MD Taking Active   insulin regular (NOVOLIN R RELION) 100 units/mL injection [id number] No Inject 25-30 units 3 times a day before meals. [name], MD Taking Active Self  Insulin Syringe-Needle U-100 (BD INSULIN SYRINGE U/F) 31G X 5/16" 0.3 ML MISC [id number] No Use to inject insulin 5 times a day. [name], MD Taking Active Self  isosorbide mononitrate (IMDUR) 30 MG 24 hr tablet [id number] No TAKE 1 TABLET (30 MG TOTAL) BY MOUTH DAILY. Lelon Perla, MD Taking Active   MAGNESIUM PO [id number] No Take 1  tablet by mouth at bedtime. [provider] Taking Active Self  Melatonin 10 MG TABS [id number] No Take 10 mg by mouth at bedtime. [provider] Taking Active Self  metoprolol tartrate (LOPRESSOR) 25 MG tablet [id number] No Take 1 tablet (25 mg total) by mouth 2 (two) times daily.  Patient taking differently: Take 12.5 mg by mouth 2 (two) times daily.   [name], MD Taking Active   nitroGLYCERIN (NITROSTAT) 0.4 MG SL tablet [id number] No Place 1 tablet (0.4 mg total) under the tongue every 5 (five) minutes as needed for chest pain. Lelon Perla, MD Taking Active Self           Med Note [facility]   Wed May 10, 2019 10:49 AM) Has on hand  pantoprazole (PROTONIX) 20 MG tablet [id number]  TAKE 1 TABLET EVERY DAY [name], MD  Active   Probiotic Product (PROBIOTIC PO) [id number] No Take 1 capsule by mouth at bedtime.  [provider] Taking Active Self  promethazine-dextromethorphan (PROMETHAZINE-DM) 6.25-15 MG/5ML syrup [id number] No Take 5 mLs by mouth 4 (four) times daily as needed for cough. [name], MD Taking Active   tiZANidine (ZANAFLEX) 4 MG tablet [id number] No Take 1 tablet (4 mg total) by mouth at bedtime as needed for muscle spasms. [name], MD Taking Active   TRUE METRIX BLOOD GLUCOSE TEST test strip [id number] No TEST BLOOD SUGAR THREE TIMES DAILY AS DIRECTED [name], MD Taking Active   TRUEplus Lancets [place] [id number] No USE TO CHECK BLOOD SUGAR 3 TIMES A Billie Ruddy, MD Taking Active           Patient Active Problem List   Diagnosis Date Noted  . Dysphagia 01/10/2020  . Lyme disease 05/04/2019  . Chronic renal failure, stage 3 (moderate) ([place]) 11/12/2018  . Chronic diastolic CHF (congestive heart failure) ([place]) 11/12/2018  . Overactive bladder 10/31/2018  . Acute diastolic CHF (congestive heart failure) ([place])   . Chest pain 09/15/2018  . UTI (urinary tract infection) 08/01/2018  . Muscle  cramps 06/13/2018  . Urinary incontinence 05/01/2018  . Anemia 11/07/2017  . Constipation 11/07/2017  . Lumbar stenosis with neurogenic claudication 10/22/2017  . Back pain 08/29/2017  . Right-sided low back pain with right-sided sciatica 11/22/2016  . Dyspnea 08/01/2016  . NSTEMI (non-ST elevated myocardial infarction) ([place]) 07/30/2016  . CAD in native artery   . Obesity   . Controlled diabetes mellitus type 2 with complications ([place]) 66/59/9357  . Lumbar radiculopathy 10/09/2015  . Neuropathy, diabetic ([place]) 05/22/2015  . H/O diverticulitis of colon 01/31/2015  . AAA (abdominal aortic aneurysm) ([place]) 01/01/2015  . Sinusitis 10/10/2014  . Right hip pain 07/05/2014  . Palpitations 02/27/2014  . Claudication of lower extremity ([place]) 10/25/2013  . BPH with obstruction/lower urinary tract symptoms 10/25/2013  . Hypertriglyceridemia 08/15/2012  . Melanoma ([place])   . Dizziness   . Renal insufficiency   . Coronary artery disease with exertional angina ([place])   . Hyperlipidemia   . Essential hypertension   . OSA (obstructive sleep apnea)   . Ejection fraction   . SOB (shortness of breath)   . PARESTHESIA 05/30/2009  . EDEMA 01/22/2009  . OTHER ABNORMAL BLOOD CHEMISTRY 04/25/2008  . Gout 10/21/2007  . Depression 10/21/2007  . VENTRAL HERNIA 10/21/2007  . HIATAL HERNIA 10/21/2007  . FATIGUE 10/21/2007  . SKIN CANCER, HX OF 10/21/2007  . NEPHROLITHIASIS, HX OF 10/21/2007    Immunization History  Administered Date(s) Administered  . Fluad Quad(high Dose 65+) 06/15/2019  . Influenza Split  08/08/2012  . Influenza Whole 08/08/2009, 07/12/2010  . Influenza, High Dose Seasonal PF 08/02/2014, 07/11/2015, 08/26/2017, 08/01/2018, 08/11/2020  . Influenza,inj,Quad PF,6+ Mos 08/05/2011, 07/27/2013, 08/04/2016  . Pneumococcal Conjugate-13 08/02/2014  . Pneumococcal Polysaccharide-23 07/28/2005  . Unspecified SARS-COV-2 Vaccination 11/23/2019, 12/18/2019    Conditions to be  addressed/monitored: CHF, CAD, HTN, HLD, DMII, CKD Stage 3a and gout, insomnia, chronic pain / neuropathy; AAA; OAB / BPH; OSA with CPAP; obesity; dysphagia  There are no care plans that you recently modified to display for this patient.   Medication Assistance: None required.  Patient affirms current coverage meets needs.  Patient's preferred pharmacy is:  [facility] [address], [place] [address] Phone: [phone] Fax: [facility] [place], [place] [address] Phone: [phone] Fax: [phone]  Follow Up:  Patient agrees to Care Plan and Follow-up.  Plan: Telephone follow up appointment with care management team member scheduled for:  3 months  Cherre Robins, PharmD Clinical Pharmacist [facility]

## 2021-02-25 NOTE — Patient Instructions (Addendum)
William Hendrix,  It was a pleasure speaking with you today. Please feel free to contact me if you have any questions or concerns. Below is information regarding you health goals.   Cherre Robins, PharmD Clinical Pharmacist [facility] [place] [facility] [phone]  Visit Information  PATIENT GOALS: Goals Addressed            This Visit's Progress   . Chronic Care Management Pharmacy Care Plan       CARE PLAN ENTRY (see longitudinal plan of care for additional care plan information)  Current Barriers:  . Chronic Disease Management support, education, and care coordination needs related to Hypertension, Hyperlipidemia/CAD, Diabetes, GERD, Depression, Insomnia, BPH, Gout, Lumbar Radiculopathy   Hypertension / Heart Failure: BP Readings from Last 3 Encounters:  01/14/21 120/60  10/14/20 (!) 144/73  09/24/20 122/80   . Pharmacist Clinical Goal(s): o Over the next 90 days, patient will work with PharmD and providers to maintain BP goal <130/80 . Current regimen:  o Metoprolol Tartrate 25mg  - take 0.5 tablet (= 12.5mg ) twice daily  . Interventions: o Discussed blood pressure goal o Recommended continued monitoring blood pressure 3 to 4 times per week . Patient self care activities - Over the next 90 days, patient will: o Check blood pressure 3 to 4 times per week, document, and provide at future appointments o Ensure daily salt intake < 2300 mg/day  Hyperlipidemia, mixed (elevated LDL and triglycerides) Lab Results  Component Value Date/Time   LDLCALC 64 08/28/2020 11:23 AM   LDLDIRECT 57.0 11/21/2019 08:24 AM   . Pharmacist Clinical Goal(s): o Over the next 90 days, patient will work with PharmD and providers to maintain LDL goal < 70, and achieve Triglyceride goal < 150 . Current regimen:  . Atorvastatin 40mg  daily . Fenofibrate 160mg  daily . Aspirin 81mg  daily . Isosorbide Mononitrate 30mg  daily . Nitriglycerine - place 1 tablet under tongue as needed  for chest pain, may repeat in 5 minutes if chest pain continues but if 2nd dose is needed, call 911 for assistance.  . Interventions: o Discussed LDL and triglyceride goal o Discussed dietary modifications to lower triglycerides . Patient self care activities - Over the next 90 days, patient will: o Maintain current cholesterol and heart medication regimen.  o Work eating less saturated fats and fried foods.  Diabetes Lab Results  Component Value Date/Time   HGBA1C 6.7 (A) 01/14/2021 02:56 PM   HGBA1C 6.2 (A) 09/10/2020 03:08 PM   HGBA1C 8.0 (H) 11/21/2019 08:24 AM   HGBA1C 7.2 (H) 08/14/2019 08:12 AM   . Pharmacist Clinical Goal(s): o Over the next 90 days, patient will work with PharmD and providers to maintain A1c goal <7% . Current regimen:  . Novolin N 40 units each morning and each evening . Novolin R 10 to 30 units prior to each meal based on blood glucose and sliding scale recommended by Dr Cruzita Lederer) . Interventions: o Discussed home blood glucose and A1c goal o Reviewed fasting home glucose o Reviewed how to identify and treat low blood glucose. . Patient self care activities - Over the next 90 days, patient will: o Check blood sugar 3 to 4 times daily, before meals; document, and provide at future appointments o Contact provider with any episodes of hypoglycemia  Lumbar Radiculopathy / chronic pain  . Pharmacist Clinical Goal(s) o Over the next 90 days, patient will work with PharmD and providers to reduce pain  . Current regimen:  o Hydrocodone/APAP 5/325mg  1/2  to 1 tablet at bedtime as needed . Interventions: o Evaluated for pain control o Discussed recent visit and recommendations from Dr Durward Fortes / ortho . Patient self care activities - Over the next 90 days, patient will: o Maintain pain medication regimen o Follow up with Dr Ellene Route / neurosurgeon as recommended by Dr Durward Fortes.   Medication management . Pharmacist Clinical Goal(s): o Over the next 90 days,  patient will work with PharmD and providers to maintain optimal medication adherence . Current pharmacy: [facility] . Interventions o Comprehensive medication review performed. o Continue current medication management strategy . Patient self care activities - Over the next 90 days, patient will: o Focus on medication adherence by filling and taking medications appropriately  o Take medications as prescribed o Report any questions or concerns to PharmD and/or provider(s)  Please see past updates related to this goal by clicking on the "Past Updates" button in the selected goal         The patient verbalized understanding of instructions, educational materials, and care plan provided today and agreed to receive a mailed copy of patient instructions, educational materials, and care plan.   Telephone follow up appointment with care management team member scheduled for: 3 months

## 2021-02-26 ENCOUNTER — Encounter: Payer: Self-pay | Admitting: Cardiology

## 2021-02-26 ENCOUNTER — Other Ambulatory Visit: Payer: Self-pay

## 2021-02-26 ENCOUNTER — Ambulatory Visit: Payer: Medicare HMO | Admitting: Cardiology

## 2021-02-26 VITALS — BP 130/58 | HR 66 | Ht 70.5 in | Wt 243.5 lb

## 2021-02-26 DIAGNOSIS — I2510 Atherosclerotic heart disease of native coronary artery without angina pectoris: Secondary | ICD-10-CM | POA: Diagnosis not present

## 2021-02-26 DIAGNOSIS — E7800 Pure hypercholesterolemia, unspecified: Secondary | ICD-10-CM

## 2021-02-26 DIAGNOSIS — I714 Abdominal aortic aneurysm, without rupture, unspecified: Secondary | ICD-10-CM

## 2021-02-26 DIAGNOSIS — I10 Essential (primary) hypertension: Secondary | ICD-10-CM

## 2021-02-26 DIAGNOSIS — I5032 Chronic diastolic (congestive) heart failure: Secondary | ICD-10-CM | POA: Diagnosis not present

## 2021-02-26 MED ORDER — AMLODIPINE BESYLATE 5 MG PO TABS: 5.0000 mg | ORAL_TABLET | Freq: Every day | ORAL | 3 refills | Status: DC

## 2021-02-26 MED ORDER — ATORVASTATIN CALCIUM 40 MG PO TABS: 1.0000 | ORAL_TABLET | Freq: Every day | ORAL | 3 refills | Status: DC

## 2021-02-26 MED ORDER — NITROGLYCERIN 0.4 MG SL SUBL: 0.4000 mg | SUBLINGUAL_TABLET | SUBLINGUAL | 3 refills | Status: DC | PRN

## 2021-02-26 MED ORDER — METOPROLOL TARTRATE 25 MG PO TABS: 25.0000 mg | ORAL_TABLET | Freq: Two times a day (BID) | ORAL | 1 refills | Status: DC

## 2021-02-26 NOTE — Progress Notes (Signed)
Pt states he only taking 1/2 tab at bedtime of hydrocodone. Pt also states he is taking a muscle relaxer during the day.

## 2021-02-26 NOTE — Patient Instructions (Signed)
Medication Instructions:   AMLODIPINE 5 MG ONCE DAILY  *If you need a refill on your cardiac medications before your next appointment, please call your pharmacy*   Lab Work:  Your physician recommends that you HAVE LAB WORK TODAY  If you have labs (blood work) drawn today and your tests are completely normal, you will receive your results only by: Marland Kitchen MyChart Message (if you have MyChart) OR . A paper copy in the mail If you have any lab test that is abnormal or we need to change your treatment, we will call you to review the results.   Follow-Up: At [facility], you and your health needs are our priority.  As part of our continuing mission to provide you with exceptional heart care, we have created designated Provider Care Teams.  These Care Teams include your primary Cardiologist (physician) and Advanced Practice Providers (APPs -  Physician Assistants and Nurse Practitioners) who all work together to provide you with the care you need, when you need it.  We recommend signing up for the patient portal called "MyChart".  Sign up information is provided on this After Visit Summary.  MyChart is used to connect with patients for Virtual Visits (Telemedicine).  Patients are able to view lab/test results, encounter notes, upcoming appointments, etc.  Non-urgent messages can be sent to your provider as well.   To learn more about what you can do with MyChart, go to [email or web address].    Your next appointment:   6 month(s)  The format for your next appointment:   In Person  Provider:   Kirk Ruths, MD

## 2021-02-27 LAB — BASIC METABOLIC PANEL
BUN/Creatinine Ratio: 16 (ref 10–24)
BUN: 23 mg/dL (ref 8–27)
CO2: 26 mmol/L (ref 20–29)
Calcium: 10.2 mg/dL (ref 8.6–10.2)
Chloride: 102 mmol/L (ref 96–106)
Creatinine, Ser: 1.46 mg/dL — ABNORMAL HIGH (ref 0.76–1.27)
Glucose: 174 mg/dL — ABNORMAL HIGH (ref 65–99)
Potassium: 4.5 mmol/L (ref 3.5–5.2)
Sodium: 144 mmol/L (ref 134–144)
eGFR: 48 mL/min/{1.73_m2} — ABNORMAL LOW (ref >59)

## 2021-02-28 ENCOUNTER — Telehealth: Payer: Medicare HMO

## 2021-02-28 ENCOUNTER — Encounter: Payer: Self-pay | Admitting: *Deleted

## 2021-03-03 ENCOUNTER — Other Ambulatory Visit: Payer: Self-pay | Admitting: Internal Medicine

## 2021-03-12 ENCOUNTER — Other Ambulatory Visit: Payer: Self-pay | Admitting: Family Medicine

## 2021-03-12 ENCOUNTER — Telehealth: Payer: Self-pay | Admitting: Pharmacist

## 2021-03-12 MED ORDER — HYDROCODONE-ACETAMINOPHEN 5-325 MG PO TABS: 0.5000 | ORAL_TABLET | Freq: Every evening | ORAL | 0 refills | Status: DC | PRN

## 2021-03-12 MED ORDER — TRAMADOL HCL 50 MG PO TABS: 50.0000 mg | ORAL_TABLET | Freq: Two times a day (BID) | ORAL | 0 refills | Status: DC | PRN

## 2021-03-12 NOTE — Telephone Encounter (Signed)
I have sent in both meds for patient to try the tramadol during the day to get through his therapy and save the Hydrocodone qhs

## 2021-03-12 NOTE — Telephone Encounter (Signed)
Patient called to request RF for hydrocodone / APAP 5/325mg  - last filled #30 on 01/10/2021; Current sig: 0.5 to 1 tablet qhs.   Also see note below from Dr Charlett Blake.  I spoke with patient today and he would like to try tramadol during day.  He will be seeing Dr Ellene Route 04/09/2021 for evaluation for next steps regarding pain.   From CCM visit 02/25/2021: William Lukes, MD  Cherre Robins, PharmD; Chism, Duard Brady, CMA Shaakira or sheketia please reach out to the patient and see if he wants to add Tramadol to try during the day twice and then reserve hydrocodone for during the night. If he is let me know and I will sent it in. Thanks Dr Jacinto Reap   Cherre Robins, PharmD Clinical Pharmacist [facility]

## 2021-03-14 NOTE — Telephone Encounter (Signed)
Patient notified of note. He only takes hydrocodone as a half tablet if needed.  Also he will take tramadol only as need.

## 2021-03-17 ENCOUNTER — Other Ambulatory Visit: Payer: Self-pay | Admitting: Family Medicine

## 2021-03-31 ENCOUNTER — Telehealth: Payer: Self-pay

## 2021-03-31 NOTE — Telephone Encounter (Signed)
Appt cancelled

## 2021-03-31 NOTE — Telephone Encounter (Signed)
Pt has had a sudden death in the family and will not make the CPE scheduled for tomorrow, wife says they will need to r/s this.

## 2021-04-01 ENCOUNTER — Encounter: Payer: Medicare HMO | Admitting: Family Medicine

## 2021-04-09 DIAGNOSIS — M5416 Radiculopathy, lumbar region: Secondary | ICD-10-CM | POA: Diagnosis not present

## 2021-04-09 DIAGNOSIS — Z6835 Body mass index (BMI) 35.0-35.9, adult: Secondary | ICD-10-CM | POA: Diagnosis not present

## 2021-04-09 DIAGNOSIS — I10 Essential (primary) hypertension: Secondary | ICD-10-CM | POA: Diagnosis not present

## 2021-04-10 DIAGNOSIS — N401 Enlarged prostate with lower urinary tract symptoms: Secondary | ICD-10-CM | POA: Diagnosis not present

## 2021-04-10 DIAGNOSIS — R3911 Hesitancy of micturition: Secondary | ICD-10-CM | POA: Diagnosis not present

## 2021-04-10 DIAGNOSIS — R3912 Poor urinary stream: Secondary | ICD-10-CM | POA: Diagnosis not present

## 2021-04-15 ENCOUNTER — Telehealth: Payer: Self-pay

## 2021-04-15 ENCOUNTER — Telehealth: Payer: Self-pay | Admitting: *Deleted

## 2021-04-15 NOTE — Telephone Encounter (Signed)
   [facility] Pre-operative Risk Assessment    Patient Name: William Hendrix  DOB: 05/13/1941  MRN: [id number]   HEARTCARE STAFF: - Please ensure there is not already an duplicate clearance open for this procedure. - Under Visit Info/Reason for Call, type in Other and utilize the format Clearance MM/DD/YY or Clearance TBD. Do not use dashes or single digits. - If request is for dental extraction, please clarify the # of teeth to be extracted. - If the patient is currently at the dentist's office, call Pre-Op APP to address. If the patient is not currently in the dentist office, please route to the Pre-Op pool  Request for surgical clearance:  What type of surgery is being performed? L5-S1 POSTERIOR LUMBAR INTERBODY FUSION   When is this surgery scheduled? TBD   What type of clearance is required (medical clearance vs. Pharmacy clearance to hold med vs. Both)? MEDICAL  Are there any medications that need to be held prior to surgery and how long? ASA   Practice name and name of physician performing surgery? [facility]; DR. [name]   What is the office phone number? [phone]   7.   What is the office fax number? [place]: JESSICA  8.   Anesthesia type (None, local, MAC, general) ? GENERAL   Julaine Hua 04/15/2021, 12:49 PM  _________________________________________________________________   (provider comments below)
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
## 2021-04-15 NOTE — Telephone Encounter (Signed)
William Hendrix 81 year old male is requesting preoperative cardiac evaluation for L5-S1 POSTERIOR LUMBAR INTERBODY FUSION.  He was last seen in the clinic on 02/26/2021.  He continued to do well at that time.  He denied dyspnea, chest pain, palpitations and syncope.  He was euvolemic and has diuretics were continued.  His lab work showed stable creatinine, glucose of 174.  His medication regimen was continued.  His PMH includes previous PCI to his LAD.  Admission 10/17 with NSTEMI.  PCI of the circumflex and RCA with DES.  Repeat cardiac catheterization 5/18 due to dyspnea and chest pain which showed patency of previously placed stents in his RCA and left circumflex and LAD.  LVEDP moderately elevated at 23.  He was admitted with CHF and chest pain 12/19.  His cardiac enzymes were negative.  His echocardiogram 12/19 showed normal LVEF, mild left atrial enlargement.  Stress test 12/19 showed no ischemia or infarction.  PMH also includes hypercholesterolemia and hypertension.   May his aspirin be held prior to his procedure?  Thank you for your help.  Please direct response to CV DIV preop pool.  William Hendrix. William Hershberger NP-C    04/15/2021, 1:11 PM [facility] [address] Office [phone] Fax [phone]

## 2021-04-16 ENCOUNTER — Other Ambulatory Visit: Payer: Self-pay | Admitting: Neurological Surgery

## 2021-04-16 DIAGNOSIS — M5416 Radiculopathy, lumbar region: Secondary | ICD-10-CM

## 2021-04-16 NOTE — Telephone Encounter (Signed)
I told him to call in Monday when he gets home.

## 2021-04-16 NOTE — Telephone Encounter (Signed)
   Name: William Hendrix  DOB: [birth year]  MRN: [id number]   Primary Cardiologist: Kirk Ruths, MD  Chart reviewed as part of pre-operative protocol coverage. Patient was contacted 04/16/2021 in reference to pre-operative risk assessment for pending surgery as outlined below.  William Hendrix was last seen on 02/2021 by Dr. Stanford Breed with history of CAD s/p prior MI/PCIs (last intervention 5465), diastolic CHF, AAA, arthritis, CKD III, DM, HTN, HLD amongst other diagnoses outlined. Stress test 2019 was normal. Doing well at last OV. Amlodipine was added for blood pressure. I reached out to patient for update on how he is doing. The patient affirms he has been doing well without any new cardiac symptoms. He reports his blood pressure has improved (I.e. example reading 129/63.) Therefore, based on ACC/AHA guidelines, the patient would be at acceptable risk for the planned procedure without further cardiovascular testing.   The patient was advised that if he develops new symptoms prior to surgery to contact our office to arrange for a follow-up visit, and he verbalized understanding.  Per Dr. Stanford Breed, [facility] to hold ASA prior to surgery   I will route this recommendation to the requesting party via Epic fax function and remove from pre-op pool. Please call with questions.  Charlie Pitter, PA-C 04/16/2021, 4:35 PM

## 2021-04-17 ENCOUNTER — Other Ambulatory Visit: Payer: Self-pay

## 2021-04-17 ENCOUNTER — Telehealth: Payer: Self-pay | Admitting: Pharmacist

## 2021-04-17 ENCOUNTER — Emergency Department (HOSPITAL_BASED_OUTPATIENT_CLINIC_OR_DEPARTMENT_OTHER)
Admission: EM | Admit: 2021-04-17 | Discharge: 2021-04-18 | Disposition: A | Discharge status: Home / Self Care | Payer: Medicare HMO | Attending: Emergency Medicine | Admitting: Emergency Medicine

## 2021-04-17 ENCOUNTER — Encounter (HOSPITAL_BASED_OUTPATIENT_CLINIC_OR_DEPARTMENT_OTHER): Payer: Self-pay | Admitting: *Deleted

## 2021-04-17 DIAGNOSIS — Z79899 Other long term (current) drug therapy: Secondary | ICD-10-CM | POA: Insufficient documentation

## 2021-04-17 DIAGNOSIS — T24201A Burn of second degree of unspecified site of right lower limb, except ankle and foot, initial encounter: Secondary | ICD-10-CM | POA: Diagnosis not present

## 2021-04-17 DIAGNOSIS — Z794 Long term (current) use of insulin: Secondary | ICD-10-CM | POA: Diagnosis not present

## 2021-04-17 DIAGNOSIS — I5032 Chronic diastolic (congestive) heart failure: Secondary | ICD-10-CM | POA: Diagnosis not present

## 2021-04-17 DIAGNOSIS — T310 Burns involving less than 10% of body surface: Secondary | ICD-10-CM | POA: Diagnosis not present

## 2021-04-17 DIAGNOSIS — Z85828 Personal history of other malignant neoplasm of skin: Secondary | ICD-10-CM | POA: Diagnosis not present

## 2021-04-17 DIAGNOSIS — L089 Local infection of the skin and subcutaneous tissue, unspecified: Secondary | ICD-10-CM | POA: Insufficient documentation

## 2021-04-17 DIAGNOSIS — I130 Hypertensive heart and chronic kidney disease with heart failure and stage 1 through stage 4 chronic kidney disease, or unspecified chronic kidney disease: Secondary | ICD-10-CM | POA: Insufficient documentation

## 2021-04-17 DIAGNOSIS — Z7982 Long term (current) use of aspirin: Secondary | ICD-10-CM | POA: Diagnosis not present

## 2021-04-17 DIAGNOSIS — T24232A Burn of second degree of left lower leg, initial encounter: Secondary | ICD-10-CM | POA: Diagnosis not present

## 2021-04-17 DIAGNOSIS — Y929 Unspecified place or not applicable: Secondary | ICD-10-CM | POA: Diagnosis not present

## 2021-04-17 DIAGNOSIS — X12XXXA Contact with other hot fluids, initial encounter: Secondary | ICD-10-CM | POA: Diagnosis not present

## 2021-04-17 DIAGNOSIS — E1142 Type 2 diabetes mellitus with diabetic polyneuropathy: Secondary | ICD-10-CM | POA: Insufficient documentation

## 2021-04-17 DIAGNOSIS — T24202A Burn of second degree of unspecified site of left lower limb, except ankle and foot, initial encounter: Secondary | ICD-10-CM | POA: Diagnosis not present

## 2021-04-17 DIAGNOSIS — N183 Chronic kidney disease, stage 3 unspecified: Secondary | ICD-10-CM | POA: Diagnosis not present

## 2021-04-17 DIAGNOSIS — Z87891 Personal history of nicotine dependence: Secondary | ICD-10-CM | POA: Insufficient documentation

## 2021-04-17 DIAGNOSIS — I2510 Atherosclerotic heart disease of native coronary artery without angina pectoris: Secondary | ICD-10-CM | POA: Insufficient documentation

## 2021-04-17 DIAGNOSIS — E1122 Type 2 diabetes mellitus with diabetic chronic kidney disease: Secondary | ICD-10-CM | POA: Diagnosis not present

## 2021-04-17 DIAGNOSIS — T148XXA Other injury of unspecified body region, initial encounter: Secondary | ICD-10-CM | POA: Diagnosis not present

## 2021-04-17 DIAGNOSIS — T300 Burn of unspecified body region, unspecified degree: Secondary | ICD-10-CM

## 2021-04-17 DIAGNOSIS — T24231A Burn of second degree of right lower leg, initial encounter: Secondary | ICD-10-CM | POA: Diagnosis not present

## 2021-04-17 DIAGNOSIS — T24031A Burn of unspecified degree of right lower leg, initial encounter: Secondary | ICD-10-CM | POA: Diagnosis present

## 2021-04-17 DIAGNOSIS — Z23 Encounter for immunization: Secondary | ICD-10-CM | POA: Insufficient documentation

## 2021-04-17 MED ORDER — TETANUS-DIPHTH-ACELL PERTUSSIS 5-2.5-18.5 LF-MCG/0.5 IM SUSY
0.5000 mL | PREFILLED_SYRINGE | Freq: Once | INTRAMUSCULAR | Status: AC
  Administered 2021-04-17: 0.5 mL via INTRAMUSCULAR
  Filled 2021-04-17: qty 0.5

## 2021-04-17 MED ORDER — FENTANYL CITRATE (PF) 100 MCG/2ML IJ SOLN
100.0000 ug | Freq: Once | INTRAMUSCULAR | Status: AC
Start: 2021-04-17 — End: 2021-04-17
  Administered 2021-04-17: 100 ug via INTRAVENOUS
  Filled 2021-04-17: qty 2

## 2021-04-17 MED ORDER — ONDANSETRON HCL 4 MG/2ML IJ SOLN
4.0000 mg | Freq: Once | INTRAMUSCULAR | Status: AC
  Administered 2021-04-17: 4 mg via INTRAVENOUS
  Filled 2021-04-17: qty 2

## 2021-04-17 MED ORDER — CEFAZOLIN SODIUM-DEXTROSE 1-4 GM/50ML-% IV SOLN
1.0000 g | Freq: Once | INTRAVENOUS | Status: AC
  Administered 2021-04-17: 1 g via INTRAVENOUS
  Filled 2021-04-17: qty 50
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
## 2021-04-18 ENCOUNTER — Telehealth: Payer: Self-pay | Admitting: *Deleted

## 2021-04-18 LAB — CBC WITH DIFFERENTIAL/PLATELET
Abs Immature Granulocytes: 0.05 10*3/uL (ref 0.00–0.07)
Basophils Absolute: 0 10*3/uL (ref 0.0–0.1)
Basophils Relative: 0 %
Eosinophils Absolute: 0.1 10*3/uL (ref 0.0–0.5)
Eosinophils Relative: 1 %
HCT: 39.4 % (ref 39.0–52.0)
Hemoglobin: 13.3 g/dL (ref 13.0–17.0)
Immature Granulocytes: 1 %
Lymphocytes Relative: 17 %
Lymphs Abs: 1.6 10*3/uL (ref 0.7–4.0)
MCH: 31.7 pg (ref 26.0–34.0)
MCHC: 33.8 g/dL (ref 30.0–36.0)
MCV: 93.8 fL (ref 80.0–100.0)
Monocytes Absolute: 0.7 10*3/uL (ref 0.1–1.0)
Monocytes Relative: 7 %
Neutro Abs: 7.1 10*3/uL (ref 1.7–7.7)
Neutrophils Relative %: 74 %
Platelets: 218 10*3/uL (ref 150–400)
RBC: 4.2 MIL/uL — ABNORMAL LOW (ref 4.22–5.81)
RDW: 14.7 % (ref 11.5–15.5)
WBC: 9.6 10*3/uL (ref 4.0–10.5)
nRBC: 0 % (ref 0.0–0.2)

## 2021-04-18 LAB — BASIC METABOLIC PANEL
Anion gap: 9 (ref 5–15)
BUN: 26 mg/dL — ABNORMAL HIGH (ref 8–23)
CO2: 30 mmol/L (ref 22–32)
Calcium: 9.6 mg/dL (ref 8.9–10.3)
Chloride: 97 mmol/L — ABNORMAL LOW (ref 98–111)
Creatinine, Ser: 1.75 mg/dL — ABNORMAL HIGH (ref 0.61–1.24)
GFR, Estimated: 39 mL/min — ABNORMAL LOW (ref >60)
Glucose, Bld: 219 mg/dL — ABNORMAL HIGH (ref 70–99)
Potassium: 3.5 mmol/L (ref 3.5–5.1)
Sodium: 136 mmol/L (ref 135–145)

## 2021-04-18 MED ORDER — FENTANYL CITRATE (PF) 100 MCG/2ML IJ SOLN
100.0000 ug | Freq: Once | INTRAMUSCULAR | Status: AC
  Administered 2021-04-18: 100 ug via INTRAVENOUS
  Filled 2021-04-18: qty 2

## 2021-04-18 MED ORDER — BACITRACIN ZINC 500 UNIT/GM EX OINT: TOPICAL_OINTMENT | Freq: Two times a day (BID) | CUTANEOUS | Status: DC

## 2021-04-18 MED ORDER — CEPHALEXIN 500 MG PO CAPS: 500.0000 mg | ORAL_CAPSULE | Freq: Four times a day (QID) | ORAL | 0 refills | Status: DC
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
## 2021-04-18 NOTE — Telephone Encounter (Signed)
Patient is down for 4pm on Thursday.    He would like to see if he can get a new prescription for the hydrocdone 5/325 to take one during the day.  His script now says to take one only at night.  With whats going on with his leg he has been hurting a little more.  Send to [facility] [employer] [employer].

## 2021-04-20 ENCOUNTER — Other Ambulatory Visit: Payer: Self-pay | Admitting: Family Medicine

## 2021-04-20 MED ORDER — HYDROCODONE-ACETAMINOPHEN 5-325 MG PO TABS: 0.5000 | ORAL_TABLET | Freq: Two times a day (BID) | ORAL | 0 refills | Status: DC | PRN

## 2021-04-21 ENCOUNTER — Other Ambulatory Visit: Payer: Self-pay | Admitting: Family Medicine

## 2021-04-21 ENCOUNTER — Telehealth: Payer: Self-pay | Admitting: *Deleted

## 2021-04-21 DIAGNOSIS — T300 Burn of unspecified body region, unspecified degree: Secondary | ICD-10-CM

## 2021-04-21 NOTE — Progress Notes (Unsigned)
Amb re 

## 2021-04-21 NOTE — Telephone Encounter (Signed)
Wife notified.

## 2021-04-21 NOTE — Telephone Encounter (Signed)
Wife wanted to see if we can order home health for patient. She has had back surgery and it is hard for her to dress his wounds and take care of him.  They have and appt on Thursday with you but just wanted to see if you can get the ball rolling.

## 2021-04-22 NOTE — Telephone Encounter (Signed)
Patient notified of referrals. He stated that he did not need referral to plastic surgeon and will discuss with Dr. Charlett Blake at visit tomorrow.

## 2021-04-24 ENCOUNTER — Other Ambulatory Visit: Payer: Self-pay

## 2021-04-24 ENCOUNTER — Encounter: Payer: Self-pay | Admitting: Family Medicine

## 2021-04-24 ENCOUNTER — Telehealth: Payer: Self-pay

## 2021-04-24 ENCOUNTER — Ambulatory Visit (INDEPENDENT_AMBULATORY_CARE_PROVIDER_SITE_OTHER): Payer: Medicare HMO | Admitting: Family Medicine

## 2021-04-24 DIAGNOSIS — I10 Essential (primary) hypertension: Secondary | ICD-10-CM

## 2021-04-24 DIAGNOSIS — X12XXXA Contact with other hot fluids, initial encounter: Secondary | ICD-10-CM

## 2021-04-24 DIAGNOSIS — T300 Burn of unspecified body region, unspecified degree: Secondary | ICD-10-CM

## 2021-04-24 DIAGNOSIS — E785 Hyperlipidemia, unspecified: Secondary | ICD-10-CM

## 2021-04-24 DIAGNOSIS — N183 Chronic kidney disease, stage 3 unspecified: Secondary | ICD-10-CM

## 2021-04-24 NOTE — Patient Instructions (Signed)
[employer] textbook of surgery (20th ed., pp. [phone]). [place], [place]: Elsevier."> Current surgical therapy (12th ed., pp. [phone]). [place], [place]: Elsevier."> Ferri's Clinical Advisor 2018 (pp. [phone]). [place], [place]: Elsevier."> Rosen's emergency medicine: Concepts and clinical practice (9th ed., pp. [phone]). [place]: Elsevier.">  Second-Degree Burn, Adult  A second-degree burn, also called a partial thickness wound, is a serious injury that affects the first two layers of skin (epidermis and dermis). A second-degree burn may be minor or major, depending on the size and partsof the skin that are burned. What are the causes? This condition may be caused by: Heat, such as from a flame or hot liquid. Radiation, such as from sunlight or radiation treatments. Electricity. This can happen when electricity passes through the body, such as from lightning, electrical outlets, or power lines. Certain chemicals, such as acids that come into contact with the skin or eyes. Some chemicals can go through clothing. What increases the risk? The following factors may make you more likely to develop this condition: Being exposed to high-risk environments, such as those with open flames, chemicals, or electricity. Having cancer and being treated with radiation. What are the signs or symptoms? Symptoms of this condition include: Severe pain. Changes in the skin. The skin can be deep red, blistered, tender, swollen, blotchy, or shiny. How is this diagnosed? This condition is usually diagnosed with a physical exam. Your health careprovider may remove any blistered skin during the exam. It may take several days to diagnose the condition because this kind of burn can take time to develop. Watch the wound for changes at home. Visit a health care provider often to have your wound checked. If the wound is large, you maystay in the hospital so a health care team can examine the wound for a few  days. How is this treated? Treatment depends on the severity and cause of the burn. Some second-degree burns, including major burns, electrical burns, and chemical burns, may need to be treated in a hospital. Treatment may include: Cooling the burn with cool, germ-free (sterile) water. Taking or applying medicines, such as: Medicines to relieve pain or itching. Ointments to treat or prevent infection. Antibiotic medicine to treat or prevent infection. Getting a tetanus shot. Covering the burn with a bandage (dressing). Applying pressure dressings to prevent scarring and to keep mobility in the burned part of the body. Removing dead skin. This is done by a health care provider. Do not try to remove dead skin yourself. In deep and large wounds, treatment involves: Surgery to remove scabs. Being given fluids and nutrition. Close monitoring of blood flow near the wound. Oxygen given through a mask or a machine (ventilator). Follow these instructions at home: Medicines Take and apply over-the-counter and prescription medicines and ointments only as told by your health care provider. If you were prescribed an antibiotic medicine, take or apply it as told by your health care provider. Do not stop using the antibiotic even if you start to feel better. Eating and drinking  Drink enough fluid to keep your urine pale yellow. Eat a nutritious diet that is high in protein. This will help your wound heal.  Wound care  Follow instructions from your health care provider about how to take care of your wound. Make sure you: Wash your hands with soap and water for at least 20 seconds before and after you change your dressing. If soap and water are not available, use hand sanitizer. Change your dressing as told by your health care  provider. If you have a compression dressing, wear it as told by your health care provider. Clean your wound 2 times a day, or as often as told by your health care  provider. Wash the wound with mild soap and water. Rinse the wound with water to remove all soap. Pat the wound dry with a clean towel. Do not rub. Check your wound every day for signs of infection. Check for: More redness, swelling, or pain. Fluid or blood. Warmth. Pus or a bad smell. Do not scratch or pick at the wound. Do not break any blisters or peel any skin. Avoid exposing your wound to the sun.  General instructions If possible, raise (elevate) the injured area above the level of your heart while you are sitting or lying down. Rest as told by your health care provider. Do not exercise until your health care provider approves. Do not take baths, swim, use a hot tub, or do anything that would put your burn underwater until your health care provider approves. Ask your health care provider if you may take showers. You may only be allowed to take sponge baths. Do not put ice on your burn. This can cause more damage. Try cooling the burn with: Cool water. A cold, wet cloth (cold compress). Do range-of-motion movements if told by your health care provider. Do not use any products that contain nicotine or tobacco, such as cigarettes, e-cigarettes, and chewing tobacco. These can delay healing. If you need help quitting, ask your health care provider. Keep all follow-up visits as told by your health care provider. This is important. How is this prevented? Make sure your water heaters are set to 120F (49C) or lower. Make sure you know how to get out of your home in case of a fire. Install smoke alarms in your home. Check them regularly to make sure they are working. Contact a health care provider if: Your symptoms do not improve with treatment. Your pain is not relieved with medicine. You have more redness, swelling, or pain around your wound. You have fluid, blood, pus, or a bad smell coming from the wound. Your wound feels warm to the touch. You have a fever or chills. Get help  right away if: You develop red streaks near the wound. You develop severe pain. Summary A second-degree burn is a serious injury that affects the first two layers of skin. Clean your wound 2 times a day or as often as told. Check your wound every day for signs of infection. Do not scratch or pick at your wound, break blisters, peel skin, or put ice on your burn. This information is not intended to replace advice given to you by your health care provider. Make sure you discuss any questions you have with your healthcare provider. Document Revised: 08/02/2019 Document Reviewed: 08/02/2019 Elsevier Patient Education  2022 [employer].
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
## 2021-04-26 DIAGNOSIS — T300 Burn of unspecified body region, unspecified degree: Secondary | ICD-10-CM | POA: Insufficient documentation

## 2021-04-26 NOTE — Assessment & Plan Note (Signed)
Hydrate and monitor 

## 2021-04-26 NOTE — Assessment & Plan Note (Signed)
Well controlled, no changes to meds. Encouraged heart healthy diet such as the DASH diet and exercise as tolerated.  °

## 2021-04-26 NOTE — Assessment & Plan Note (Signed)
Encourage heart healthy diet such as MIND or DASH diet, increase exercise, avoid trans fats, simple carbohydrates and processed foods, consider a krill or fish or flaxseed oil cap daily. Tolerating Atorvastatin 
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
## 2021-04-27 DIAGNOSIS — I5032 Chronic diastolic (congestive) heart failure: Secondary | ICD-10-CM | POA: Diagnosis not present

## 2021-04-27 DIAGNOSIS — E1142 Type 2 diabetes mellitus with diabetic polyneuropathy: Secondary | ICD-10-CM | POA: Diagnosis not present

## 2021-04-27 DIAGNOSIS — X12XXXA Contact with other hot fluids, initial encounter: Secondary | ICD-10-CM | POA: Diagnosis not present

## 2021-04-27 DIAGNOSIS — I130 Hypertensive heart and chronic kidney disease with heart failure and stage 1 through stage 4 chronic kidney disease, or unspecified chronic kidney disease: Secondary | ICD-10-CM | POA: Diagnosis not present

## 2021-04-27 DIAGNOSIS — T24292D Burn of second degree of multiple sites of left lower limb, except ankle and foot, subsequent encounter: Secondary | ICD-10-CM | POA: Diagnosis not present

## 2021-04-27 DIAGNOSIS — L089 Local infection of the skin and subcutaneous tissue, unspecified: Secondary | ICD-10-CM | POA: Diagnosis not present

## 2021-04-27 DIAGNOSIS — E1122 Type 2 diabetes mellitus with diabetic chronic kidney disease: Secondary | ICD-10-CM | POA: Diagnosis not present

## 2021-04-27 DIAGNOSIS — T24291D Burn of second degree of multiple sites of right lower limb, except ankle and foot, subsequent encounter: Secondary | ICD-10-CM | POA: Diagnosis not present

## 2021-04-27 DIAGNOSIS — N183 Chronic kidney disease, stage 3 unspecified: Secondary | ICD-10-CM | POA: Diagnosis not present

## 2021-04-28 ENCOUNTER — Telehealth: Payer: Self-pay | Admitting: *Deleted

## 2021-04-28 NOTE — Telephone Encounter (Signed)
Aggie Hacker D, CMA; Chism, Duard Brady, CMA [facility] needs to know how often the pt bandages need to be changed. Thanks         Previous Messages    ----- Message -----  From: Candi Leash  Sent: 04/25/2021   8:23 AM EDT  To: Carolann Littler,   The wife says she cannot help with the dressing changes for the patient as she recently had back surgery and cannot bend over. Can you please ask the provider how often the bandages need to be changed? Thanks   Hoyle Sauer   ----- Message -----  From: Trenda Moots  Sent: 04/22/2021  10:58 AM EDT  To: Blair Heys are you able to assist with this pt? Thanks

## 2021-04-28 NOTE — Telephone Encounter (Signed)
Spoke with [facility] rep. And she is aware that they need to go out 2-3 days a week to change wound.

## 2021-04-28 NOTE — Telephone Encounter (Signed)
error 

## 2021-04-29 DIAGNOSIS — S71101D Unspecified open wound, right thigh, subsequent encounter: Secondary | ICD-10-CM | POA: Diagnosis not present

## 2021-04-29 DIAGNOSIS — S81801D Unspecified open wound, right lower leg, subsequent encounter: Secondary | ICD-10-CM | POA: Diagnosis not present

## 2021-05-01 DIAGNOSIS — I5032 Chronic diastolic (congestive) heart failure: Secondary | ICD-10-CM | POA: Diagnosis not present

## 2021-05-01 DIAGNOSIS — T24292D Burn of second degree of multiple sites of left lower limb, except ankle and foot, subsequent encounter: Secondary | ICD-10-CM | POA: Diagnosis not present

## 2021-05-01 DIAGNOSIS — E1142 Type 2 diabetes mellitus with diabetic polyneuropathy: Secondary | ICD-10-CM | POA: Diagnosis not present

## 2021-05-01 DIAGNOSIS — X12XXXA Contact with other hot fluids, initial encounter: Secondary | ICD-10-CM | POA: Diagnosis not present

## 2021-05-01 DIAGNOSIS — N183 Chronic kidney disease, stage 3 unspecified: Secondary | ICD-10-CM | POA: Diagnosis not present

## 2021-05-01 DIAGNOSIS — T24291D Burn of second degree of multiple sites of right lower limb, except ankle and foot, subsequent encounter: Secondary | ICD-10-CM | POA: Diagnosis not present

## 2021-05-01 DIAGNOSIS — L089 Local infection of the skin and subcutaneous tissue, unspecified: Secondary | ICD-10-CM | POA: Diagnosis not present

## 2021-05-01 DIAGNOSIS — E1122 Type 2 diabetes mellitus with diabetic chronic kidney disease: Secondary | ICD-10-CM | POA: Diagnosis not present

## 2021-05-01 DIAGNOSIS — I130 Hypertensive heart and chronic kidney disease with heart failure and stage 1 through stage 4 chronic kidney disease, or unspecified chronic kidney disease: Secondary | ICD-10-CM | POA: Diagnosis not present

## 2021-05-05 ENCOUNTER — Other Ambulatory Visit: Payer: Self-pay | Admitting: Family Medicine

## 2021-05-05 ENCOUNTER — Telehealth: Payer: Self-pay

## 2021-05-05 DIAGNOSIS — I130 Hypertensive heart and chronic kidney disease with heart failure and stage 1 through stage 4 chronic kidney disease, or unspecified chronic kidney disease: Secondary | ICD-10-CM | POA: Diagnosis not present

## 2021-05-05 DIAGNOSIS — I5032 Chronic diastolic (congestive) heart failure: Secondary | ICD-10-CM | POA: Diagnosis not present

## 2021-05-05 DIAGNOSIS — N183 Chronic kidney disease, stage 3 unspecified: Secondary | ICD-10-CM | POA: Diagnosis not present

## 2021-05-05 DIAGNOSIS — T24291D Burn of second degree of multiple sites of right lower limb, except ankle and foot, subsequent encounter: Secondary | ICD-10-CM | POA: Diagnosis not present

## 2021-05-05 DIAGNOSIS — E1142 Type 2 diabetes mellitus with diabetic polyneuropathy: Secondary | ICD-10-CM | POA: Diagnosis not present

## 2021-05-05 DIAGNOSIS — T24292D Burn of second degree of multiple sites of left lower limb, except ankle and foot, subsequent encounter: Secondary | ICD-10-CM | POA: Diagnosis not present

## 2021-05-05 DIAGNOSIS — L089 Local infection of the skin and subcutaneous tissue, unspecified: Secondary | ICD-10-CM | POA: Diagnosis not present

## 2021-05-05 DIAGNOSIS — E1122 Type 2 diabetes mellitus with diabetic chronic kidney disease: Secondary | ICD-10-CM | POA: Diagnosis not present

## 2021-05-05 DIAGNOSIS — X12XXXA Contact with other hot fluids, initial encounter: Secondary | ICD-10-CM | POA: Diagnosis not present

## 2021-05-05 MED ORDER — CEFDINIR 300 MG PO CAPS: 300.0000 mg | ORAL_CAPSULE | Freq: Two times a day (BID) | ORAL | 0 refills | Status: AC

## 2021-05-05 NOTE — Telephone Encounter (Signed)
Pts wife called and asked that she have a rtn call about some questions she has:  Has she been wrapping the wounds too much? The lower leg is a bright red, and the upper leg is crusting over, pt is in extreme pain.  Doe he need another round of antibiotic? He is very irritable, Nurse is coming today around noon and will probably rewrap it again.    Pharmacy: [facility] Number: [phone]

## 2021-05-06 ENCOUNTER — Other Ambulatory Visit: Payer: Self-pay | Admitting: Family Medicine

## 2021-05-06 MED ORDER — HYDROCODONE-ACETAMINOPHEN 5-325 MG PO TABS: 0.5000 | ORAL_TABLET | Freq: Two times a day (BID) | ORAL | 0 refills | Status: DC | PRN

## 2021-05-07 ENCOUNTER — Ambulatory Visit
Admission: RE | Admit: 2021-05-07 | Discharge: 2021-05-07 | Disposition: A | Discharge status: Home / Self Care | Payer: Medicare HMO | Source: Ambulatory Visit | Attending: Neurological Surgery | Admitting: Neurological Surgery

## 2021-05-07 ENCOUNTER — Other Ambulatory Visit: Payer: Self-pay

## 2021-05-07 DIAGNOSIS — I5032 Chronic diastolic (congestive) heart failure: Secondary | ICD-10-CM | POA: Diagnosis not present

## 2021-05-07 DIAGNOSIS — M5416 Radiculopathy, lumbar region: Secondary | ICD-10-CM

## 2021-05-07 DIAGNOSIS — T24292D Burn of second degree of multiple sites of left lower limb, except ankle and foot, subsequent encounter: Secondary | ICD-10-CM | POA: Diagnosis not present

## 2021-05-07 DIAGNOSIS — M545 Low back pain, unspecified: Secondary | ICD-10-CM | POA: Diagnosis not present

## 2021-05-07 DIAGNOSIS — T24291D Burn of second degree of multiple sites of right lower limb, except ankle and foot, subsequent encounter: Secondary | ICD-10-CM | POA: Diagnosis not present

## 2021-05-07 DIAGNOSIS — X12XXXA Contact with other hot fluids, initial encounter: Secondary | ICD-10-CM | POA: Diagnosis not present

## 2021-05-07 DIAGNOSIS — E1122 Type 2 diabetes mellitus with diabetic chronic kidney disease: Secondary | ICD-10-CM | POA: Diagnosis not present

## 2021-05-07 DIAGNOSIS — E1142 Type 2 diabetes mellitus with diabetic polyneuropathy: Secondary | ICD-10-CM | POA: Diagnosis not present

## 2021-05-07 DIAGNOSIS — I130 Hypertensive heart and chronic kidney disease with heart failure and stage 1 through stage 4 chronic kidney disease, or unspecified chronic kidney disease: Secondary | ICD-10-CM | POA: Diagnosis not present

## 2021-05-07 DIAGNOSIS — L089 Local infection of the skin and subcutaneous tissue, unspecified: Secondary | ICD-10-CM | POA: Diagnosis not present

## 2021-05-07 DIAGNOSIS — N183 Chronic kidney disease, stage 3 unspecified: Secondary | ICD-10-CM | POA: Diagnosis not present

## 2021-05-07 NOTE — Telephone Encounter (Signed)
LVM to call back.

## 2021-05-09 NOTE — Telephone Encounter (Signed)
Left detailed message on machine.

## 2021-05-09 NOTE — Telephone Encounter (Signed)
Patient called back and was notified.

## 2021-05-13 DIAGNOSIS — I5032 Chronic diastolic (congestive) heart failure: Secondary | ICD-10-CM | POA: Diagnosis not present

## 2021-05-13 DIAGNOSIS — E1142 Type 2 diabetes mellitus with diabetic polyneuropathy: Secondary | ICD-10-CM | POA: Diagnosis not present

## 2021-05-13 DIAGNOSIS — I130 Hypertensive heart and chronic kidney disease with heart failure and stage 1 through stage 4 chronic kidney disease, or unspecified chronic kidney disease: Secondary | ICD-10-CM | POA: Diagnosis not present

## 2021-05-13 DIAGNOSIS — N183 Chronic kidney disease, stage 3 unspecified: Secondary | ICD-10-CM | POA: Diagnosis not present

## 2021-05-13 DIAGNOSIS — X12XXXA Contact with other hot fluids, initial encounter: Secondary | ICD-10-CM | POA: Diagnosis not present

## 2021-05-13 DIAGNOSIS — M5416 Radiculopathy, lumbar region: Secondary | ICD-10-CM | POA: Diagnosis not present

## 2021-05-13 DIAGNOSIS — T24292D Burn of second degree of multiple sites of left lower limb, except ankle and foot, subsequent encounter: Secondary | ICD-10-CM | POA: Diagnosis not present

## 2021-05-13 DIAGNOSIS — L089 Local infection of the skin and subcutaneous tissue, unspecified: Secondary | ICD-10-CM | POA: Diagnosis not present

## 2021-05-13 DIAGNOSIS — E1122 Type 2 diabetes mellitus with diabetic chronic kidney disease: Secondary | ICD-10-CM | POA: Diagnosis not present

## 2021-05-13 DIAGNOSIS — T24291D Burn of second degree of multiple sites of right lower limb, except ankle and foot, subsequent encounter: Secondary | ICD-10-CM | POA: Diagnosis not present

## 2021-05-20 DIAGNOSIS — I5032 Chronic diastolic (congestive) heart failure: Secondary | ICD-10-CM | POA: Diagnosis not present

## 2021-05-20 DIAGNOSIS — I130 Hypertensive heart and chronic kidney disease with heart failure and stage 1 through stage 4 chronic kidney disease, or unspecified chronic kidney disease: Secondary | ICD-10-CM | POA: Diagnosis not present

## 2021-05-20 DIAGNOSIS — T24292D Burn of second degree of multiple sites of left lower limb, except ankle and foot, subsequent encounter: Secondary | ICD-10-CM | POA: Diagnosis not present

## 2021-05-20 DIAGNOSIS — L089 Local infection of the skin and subcutaneous tissue, unspecified: Secondary | ICD-10-CM | POA: Diagnosis not present

## 2021-05-20 DIAGNOSIS — N183 Chronic kidney disease, stage 3 unspecified: Secondary | ICD-10-CM | POA: Diagnosis not present

## 2021-05-20 DIAGNOSIS — E1142 Type 2 diabetes mellitus with diabetic polyneuropathy: Secondary | ICD-10-CM | POA: Diagnosis not present

## 2021-05-20 DIAGNOSIS — T24291D Burn of second degree of multiple sites of right lower limb, except ankle and foot, subsequent encounter: Secondary | ICD-10-CM | POA: Diagnosis not present

## 2021-05-20 DIAGNOSIS — E1122 Type 2 diabetes mellitus with diabetic chronic kidney disease: Secondary | ICD-10-CM | POA: Diagnosis not present

## 2021-05-20 DIAGNOSIS — X12XXXA Contact with other hot fluids, initial encounter: Secondary | ICD-10-CM | POA: Diagnosis not present

## 2021-05-27 ENCOUNTER — Ambulatory Visit (INDEPENDENT_AMBULATORY_CARE_PROVIDER_SITE_OTHER): Payer: Medicare HMO | Admitting: Internal Medicine

## 2021-05-27 ENCOUNTER — Other Ambulatory Visit: Payer: Self-pay

## 2021-05-27 ENCOUNTER — Encounter: Payer: Self-pay | Admitting: Internal Medicine

## 2021-05-27 VITALS — BP 128/82 | HR 67 | Ht 70.0 in

## 2021-05-27 DIAGNOSIS — Z794 Long term (current) use of insulin: Secondary | ICD-10-CM | POA: Diagnosis not present

## 2021-05-27 DIAGNOSIS — E1159 Type 2 diabetes mellitus with other circulatory complications: Secondary | ICD-10-CM

## 2021-05-27 DIAGNOSIS — E785 Hyperlipidemia, unspecified: Secondary | ICD-10-CM

## 2021-05-27 DIAGNOSIS — Z6837 Body mass index (BMI) 37.0-37.9, adult: Secondary | ICD-10-CM | POA: Diagnosis not present

## 2021-05-27 LAB — POCT GLYCOSYLATED HEMOGLOBIN (HGB A1C): Hemoglobin A1C: 6 % — AB (ref 4.0–5.6)

## 2021-05-27 NOTE — Patient Instructions (Addendum)
Please continue: Insulin Before breakfast Before lunch Before dinner  Regular '30 30 30  '$ NPH 40  40   If the sugars before the meal are: <80: skip the R insulin before that meal 80-100: take only 10 units of R insulin 101-120: take only 15 units of R insulin  Otherwise, take the full 30 units dose.  If you take dinnertime insulin after dinner, take 50% of the dose.  Please return in 4 months with your sugar log.

## 2021-05-27 NOTE — Progress Notes (Signed)
Patient ID: William Hendrix, male   DOB: Jul 27, 1941, 80 y.o.   MRN: [id number]  This visit occurred during the SARS-CoV-2 public health emergency.  Safety protocols were in place, including screening questions prior to the visit, additional usage of staff PPE, and extensive cleaning of exam room while observing appropriate contact time as indicated for disinfecting solutions.   HPI: William Hendrix is a 80 y.o.-year-old male, returning for f/u for DM2, dx 2009, insulin-dependent, uncontrolled, with complications (CAD s/p AMI 2017, CKD stage 3). Last visit 4.5 months ago.  Interim history: In 2018, he eliminated meat almost completely and his sugars improved significantly while he also lost weight.  He continues to eat many vegetables, but he reintroduced meat, bacon, eggs and sugars started to increase. No increased urination, blurry vision, nausea, chest pain. He burned himself with grease after he dropped a pot on his entire R leg at the beginning of last month.  This is healing up, but he still has pain in the leg. He continues to have back pain>> will have back surgery after the wounds on his leg heal.  Reviewed HbA1c levels: Lab Results  Component Value Date   HGBA1C 6.7 (A) 01/14/2021   HGBA1C 6.2 (A) 09/10/2020   HGBA1C 6.7 (A) 04/30/2020   HGBA1C 8.0 (H) 11/21/2019   HGBA1C 7.2 (H) 08/14/2019   HGBA1C 6.7 (H) 05/11/2019   HGBA1C 6.3 (A) 04/27/2019   HGBA1C 6.8 (H) 12/15/2018   HGBA1C 6.1 (A) 05/10/2018   HGBA1C 5.7 01/06/2018   HGBA1C 6.3 09/09/2017   HGBA1C 7.3 06/09/2017   HGBA1C 7.5 03/02/2017   HGBA1C 7.7 11/26/2016   HGBA1C 6.5 08/28/2016   HGBA1C 7.0 05/28/2016   HGBA1C 7.8 02/26/2016   HGBA1C 7.9 11/28/2015   HGBA1C 7.8 (H) 07/04/2015   HGBA1C 8.3 (H) 03/28/2015   He is on: ReliOn insulin: Insulin Before breakfast Before lunch Before dinner  Regular 25-32 >> 30 25-32 >> 30 25-32 >> 30  NPH 40-50 >> 40  50 >> 40   If the sugars before the meal are: <80: skip  the R insulin before that meal 80-100: take only 10 units of R insulin 101-120: take only 15 units of R insulin  Otherwise, take the full 30 units dose.  We cannot use Metformin 2/2 CKD. We stopped Januvia >> could not afford ($100/3 mo)  -We cannot use Levemir >> developed a severe skin reaction We stopped Amaryl 4 mg. - He checks sugars 3 times a day per review of his log: - am: 64, 73, 81-146, 154 >> 53, 65-137, 149, 210 (steroid inj) >> 87-154, 172 - Before lunch: 97-142, 231 >> 110-133 >> 96, 106-160 >> 76-143 - before dinner: 63, 74-194 >> 108-203, 220, 326 >> 97-189, 201, 238 - Before bedtime: 57, 120-145 >> 180-201 >> n/c >> 127-173, 207 >> n/c Lowest sugar was  73 >> 63 >> 53 (am) >> 87; it is unclear at which level he has hypoglycemia awareness. Highest sugar was  231 >> 207 >> 326 >> 238.  Meals: - Breakfast: oatmeal + toast + tomatoes - Lunch: beans  - Dinner:home cooked veggies   + CKD, last BUN/creatinine was:  Lab Results  Component Value Date   BUN 26 (H) 04/17/2021   CREATININE 1.75 (H) 04/17/2021   ACR levels were normal: Lab Results  Component Value Date   MICRALBCREAT 5.1 08/02/2014   MICRALBCREAT 0.7 02/23/2013   Reviewed GFR levels: Lab Results  Component Value Date   GFRAA  50 (L) 08/28/2020   GFRAA 48 (L) 01/09/2020   GFRAA 49 (L) 12/13/2019   GFRAA 48 (L) 04/22/2019   GFRAA 44 (L) 12/29/2018   GFRAA 39 (L) 11/12/2018   GFRAA 32 (L) 11/11/2018   GFRAA 44 (L) 09/16/2018   GFRAA 43 (L) 09/15/2018   GFRAA 46 (L) 08/21/2018   + HL; latest lipids: Lab Results  Component Value Date   CHOL 128 08/28/2020   HDL 25 (L) 08/28/2020   LDLCALC 64 08/28/2020   LDLDIRECT 57.0 11/21/2019   TRIG 240 (H) 08/28/2020   CHOLHDL 5.1 (H) 08/28/2020  On Lipitor, fenofibrate, flaxseed oil  Pt's last eye exam was 10/2020: No DR reportedly. Had cataract sx in 2011.  + numbness and tingling in his legs.  He had back surgery in 03/2016 and 10/2017.  He  developed a kidney stone after his last surgery. He was admitted 2x in 2017 for: CP/SOB >> AMI.  He had 2 stents placed then. He had a cardiac cath 02/2017 >> no new blockages He was seen emergency room with colitis 04/22/2019.    PMH: Pt also also has a history of CAD, HTN, CKD stage III, history of nephrolithiasis, hiatal hernia, OSA, obesity, melanoma, spinal stenosis.  ROS: Constitutional: no weight gain/no weight loss, no fatigue, no subjective hyperthermia, no subjective hypothermia Eyes: no blurry vision, no xerophthalmia ENT: no sore throat, no nodules palpated in neck, no dysphagia, no odynophagia, no hoarseness Cardiovascular: no CP/no SOB/no palpitations/no leg swelling Respiratory: no cough/no SOB/no wheezing Gastrointestinal: no N/no V/no D/no C/no acid reflux Musculoskeletal: + muscle aches/+ joint aches (back) Skin: + rashes, no hair loss Neurological: + tremors/+ numbness/+ tingling/no dizziness  I reviewed pt's medications, allergies, PMH, social hx, family hx, and changes were documented in the history of present illness. Otherwise, unchanged from my initial visit note.  Past Medical History:  Diagnosis Date   AAA (abdominal aortic aneurysm) ([place]) 12/15/2014   a. Mild aneurysmal dilatation of the infrarenal abdominal aorta 3.2 cm - f/u due by 2019.   Arthritis    "all over" (07/30/2016)   CAD (coronary artery disease)    a. stent to LAD 2000. b. possible spasm by cath 2001. c. IVUS/PTCA/DES to mLAD 05/2013. d. PTCA/DES of dRCA into ostial rPDA 07/2013. e. PTCA of OM2, DES to Cx, DES to [facility] 07/2016.   Chronic bronchitis (HCC)    Chronic diastolic CHF (congestive heart failure) (HCC)    Chronic lower back pain    Chronic neck pain    CKD (chronic kidney disease), stage III (HCC)    Diabetic peripheral neuropathy ([place])    Ejection fraction    55%, 07/2010, mild inferior hypo   GERD (gastroesophageal reflux disease)    Gout    H/O diverticulitis of colon 01/31/2015    H/O hiatal hernia    History of blood transfusion ~ 10/1940   "had pneumonia"   History of kidney stones    HTN (hypertension)    Hyperlipidemia    Melanoma of forearm, left ([place]) 02/2011   with wide excision    Myocardial infarction ([place])    Nephrolithiasis    Obesity    OSA on CPAP     [name] since 2000   Peripheral neuropathy    both feet   Positive D-dimer    a. significant elevation ,hospital 07/2010, etiology unclear. b. D Dimer chronically > 20.   Renal insufficiency    Skin cancer    Spinal stenosis  a. s/p surgical repair 2013   Spinal stenosis of lumbar region    Type II diabetes mellitus ([place])    Ventral hernia    Past Surgical History:  Procedure Laterality Date   ANTERIOR LAT LUMBAR FUSION  10/22/2017   Procedure: Lumbar two-three Lumbar three-four Lumbar four-five Anteriolateral lumbar interbody fusion with percutaneous pedicle screw fixation and infuse;  Surgeon: [name], MD;  Location: [place];  Service: Neurosurgery;;   APPLICATION OF ROBOTIC ASSISTANCE FOR SPINAL PROCEDURE  10/22/2017   Procedure: APPLICATION OF ROBOTIC ASSISTANCE FOR SPINAL PROCEDURE;  Surgeon: [name], MD;  Location: [place];  Service: Neurosurgery;;   BACK SURGERY     CARDIAC CATHETERIZATION N/A 07/30/2016   Procedure: Left Heart Cath and Coronary Angiography;  Surgeon: Nelva Bush, MD;  Location: [place] CV LAB;  Service: Cardiovascular;  Laterality: N/A;   CARDIAC CATHETERIZATION N/A 07/30/2016   Procedure: Coronary Stent Intervention;  Surgeon: Nelva Bush, MD;  Location: [place] CV LAB;  Service: Cardiovascular;  Laterality: N/A;  Mid CFX and MID RCA   CARDIAC CATHETERIZATION N/A 07/30/2016   Procedure: Coronary Balloon Angioplasty;  Surgeon: Nelva Bush, MD;  Location: [place] [facility];  Service: Cardiovascular;  Laterality: N/A;  OM 1   CARPAL TUNNEL RELEASE Left    CATARACT EXTRACTION W/ INTRAOCULAR LENS  IMPLANT, BILATERAL Bilateral ~ 2014    CERVICAL DISC SURGERY  1990s   CORONARY ANGIOPLASTY WITH STENT PLACEMENT  05/2013   CORONARY ANGIOPLASTY WITH STENT PLACEMENT  07/26/2013   DES to RCA extending to PDA     CORONARY ANGIOPLASTY WITH STENT PLACEMENT  07/30/2016   CORONARY ANGIOPLASTY WITH STENT PLACEMENT  2000   CAD   DENTAL SURGERY  04/2016   "got infected; had to dig it out"   EYE SURGERY     KNEE ARTHROSCOPY Right 1990's   rt   LEFT AND RIGHT HEART CATHETERIZATION WITH CORONARY ANGIOGRAM N/A 07/26/2013   Procedure: LEFT AND RIGHT HEART CATHETERIZATION WITH CORONARY ANGIOGRAM;  Surgeon: [name], MD;  Location: [place] CATH LAB;  Service: Cardiovascular;  Laterality: N/A;   LEFT HEART CATH AND CORONARY ANGIOGRAPHY N/A 02/26/2017   Procedure: Left Heart Cath and Coronary Angiography;  Surgeon: Sherren Mocha, MD;  Location: [place] [facility];  Service: Cardiovascular;  Laterality: N/A;   LEFT HEART CATHETERIZATION WITH CORONARY ANGIOGRAM N/A 05/19/2013   Procedure: LEFT HEART CATHETERIZATION WITH CORONARY ANGIOGRAM;  Surgeon: [name], MD;  Location: [facility] CATH LAB;  Service: Cardiovascular;  Laterality: N/A;   LUMBAR LAMINECTOMY/DECOMPRESSION MICRODISCECTOMY  08/30/2012   Procedure: LUMBAR LAMINECTOMY/DECOMPRESSION MICRODISCECTOMY 2 LEVELS;  Surgeon: [name], MD;  Location: [place] NEUR[facility] ORS;  Service: Neurosurgery;  Laterality: Bilateral;  Bilateral Lumbar three-four Lumbar four-five Laminotomies   LUMBAR PERCUTANEOUS PEDICLE SCREW 1 LEVEL Bilateral 10/22/2017   Procedure: LUMBAR PERCUTANEOUS PEDICLE SCREW 1 LEVEL;  Surgeon: [name], MD;  Location: [place];  Service: Neurosurgery;  Laterality: Bilateral;   LUMBAR SPINE SURGERY  04/07/2016   Dr. Ellene Route; "?ruptured disc"   MELANOMA EXCISION Left 02/2011   forearm   NASAL SINUS SURGERY  1970s   "cut windows in sinus pockets"   PERCUTANEOUS CORONARY STENT INTERVENTION (PCI-S)  05/19/2013   Procedure: PERCUTANEOUS CORONARY STENT INTERVENTION (PCI-S);  Surgeon: [name], MD;  Location: [facility] CATH LAB;  Service: Cardiovascular;;   ULNAR TUNNEL RELEASE Left 04/07/2016   Dr. Ellene Route   Social History   Socioeconomic History   Marital status: Married    Spouse  name: Not on file   Number of children: Not on file   Years of education: Not on file   Highest education level: Not on file  Occupational History   Occupation: Painter  Tobacco Use   Smoking status: Former    Packs/day: 3.00    Years: 30.00    Pack years: 90.00    Types: Cigarettes    Quit date: 09/17/1996    Years since quitting: 24.7   Smokeless tobacco: Never  Vaping Use   Vaping Use: Never used  Substance and Sexual Activity   Alcohol use: No   Drug use: No   Sexual activity: Not on file  Other Topics Concern   Not on file  Social History Narrative   Married and lives locally with his wife.  Sharyon Cable.   Social Determinants of Health   Financial Resource Strain: Low Risk    Difficulty of Paying Living Expenses: Not very hard  Food Insecurity: Not on file  Transportation Needs: Not on file  Physical Activity: Inactive   Days of Exercise per Week: 0 days   Minutes of Exercise per Session: 0 min  Stress: Not on file  Social Connections: Not on file  Intimate Partner Violence: Not on file   Current Outpatient Medications on File Prior to Visit  Medication Sig Dispense Refill   acetaminophen (TYLENOL) 500 MG tablet Take 500 mg by mouth 2 (two) times daily.      alfuzosin (UROXATRAL) 10 MG 24 hr tablet Take 10 mg by mouth daily with breakfast.     allopurinol (ZYLOPRIM) 300 MG tablet TAKE 1 TABLET EVERY DAY 90 tablet 1   amLODipine (NORVASC) 5 MG tablet Take 1 tablet (5 mg total) by mouth daily. 90 tablet 3   aspirin EC 81 MG tablet Take 81 mg by mouth daily.     atorvastatin (LIPITOR) 40 MG tablet Take 1 tablet (40 mg total) by mouth daily. 90 tablet 3   B Complex Vitamins (B COMPLEX-B12 PO) Take 1 tablet by mouth daily.      Blood Glucose Monitoring Suppl (TRUE METRIX  METER) w/Device KIT USE AS DIRECTED  TO TEST BLOOD GLUCOSE 1 kit 0   cephALEXin (KEFLEX) 500 MG capsule Take 1 capsule (500 mg total) by mouth 4 (four) times daily. 28 capsule 0   Chlorpheniramine Maleate (CHLOR-TRIMETON ALLERGY PO) Take 1 tablet by mouth 2 (two) times daily as needed (allergies).     Cholecalciferol (VITAMIN D3) 50 MCG (2000 UT) capsule Take 2,000 Units by mouth at bedtime.      DROPLET INSULIN SYRINGE 31G X 5/16" 1 ML MISC USE TO INJECT INSULIN 5 TIMES DAILY 500 each 0   fenofibrate 160 MG tablet TAKE 1 TABLET AT BEDTIME 90 tablet 1   fluocinonide cream (LIDEX) 2.02 % Apply 1 application topically daily as needed (for rashes).      fluticasone (FLONASE) 50 MCG/ACT nasal spray Place 2 sprays into both nostrils daily. (Patient taking differently: Place 2 sprays into both nostrils daily as needed for allergies.) 16 g 1   furosemide (LASIX) 40 MG tablet TAKE 2 TABLETS (80 MG TOTAL) BY MOUTH DAILY. (Patient taking differently: Take 80 mg by mouth. Every other morning) 180 tablet 3   HYDROcodone-acetaminophen (NORCO) 5-325 MG tablet Take 0.5-1 tablets by mouth 2 (two) times daily as needed for moderate pain. 60 tablet 0   insulin NPH Human (NOVOLIN N) 100 UNIT/ML injection Inject 0.5 mLs (50 Units total) into the skin 2 (two) times daily  before a meal. (Patient taking differently: Inject 40 Units into the skin 2 (two) times daily before a meal.) 10 mL 11   insulin regular (NOVOLIN R RELION) 100 units/mL injection Inject 25-30 units 3 times a day before meals. 60 mL 1   Insulin Syringe-Needle U-100 (BD INSULIN SYRINGE U/F) 31G X 5/16" 0.3 ML MISC Use to inject insulin 5 times a day. 500 each 11   isosorbide mononitrate (IMDUR) 30 MG 24 hr tablet TAKE 1 TABLET (30 MG TOTAL) BY MOUTH DAILY. 90 tablet 3   MAGNESIUM PO Take 1 tablet by mouth at bedtime.     Melatonin 10 MG TABS Take 10 mg by mouth at bedtime.     metoprolol tartrate (LOPRESSOR) 25 MG tablet Take 1 tablet (25 mg total) by  mouth 2 (two) times daily. 180 tablet 1   nitroGLYCERIN (NITROSTAT) 0.4 MG SL tablet Place 1 tablet (0.4 mg total) under the tongue every 5 (five) minutes as needed for chest pain. 100 tablet 3   pantoprazole (PROTONIX) 20 MG tablet TAKE 1 TABLET EVERY DAY 90 tablet 3   Probiotic Product (PROBIOTIC PO) Take 1 capsule by mouth at bedtime.      tiZANidine (ZANAFLEX) 4 MG tablet Take 1 tablet (4 mg total) by mouth at bedtime as needed for muscle spasms. 30 tablet 5   traMADol (ULTRAM) 50 MG tablet Take 1 tablet (50 mg total) by mouth 2 (two) times daily as needed for moderate pain or severe pain. 60 tablet 0   TRUE METRIX BLOOD GLUCOSE TEST test strip TEST BLOOD SUGAR THREE TIMES DAILY AS DIRECTED 300 strip 11   TRUEplus Lancets 33G MISC USE TO CHECK BLOOD SUGAR 3 TIMES A DAY 300 each 3   No current facility-administered medications on file prior to visit.   Allergies  Allergen Reactions   Brilinta [Ticagrelor] Shortness Of Breath   Insulin Detemir Swelling, Other (See Comments) and Itching    Patient had redness and swelling and tenderness at injection site. Patient had redness and swelling and tenderness at injection site.   Bee Venom    Codeine Other (See Comments)    Makes him "shakey", is OK with hydrocodone GI UPSET & TREMORS Makes him "shakey", is OK with hydrocodone Makes him "shakey", is OK with hydrocodone GI UPSET & TREMORS Other reaction(s): GI issues   Gadobenate     Other reaction(s): fever, sweats and nausea   Insulin Aspart    Lipitor [Atorvastatin] Other (See Comments)    Muscle aches; affects liver function- Patient is currently taking   Novolog Mix [Insulin Aspart Prot & Aspart] Other (See Comments)    Causes skin to swell/itch at injection site   Family History  Problem Relation Age of Onset   Cancer Mother        intestinal    Heart disease Mother    Cancer Father        prostate   Migraines Daughter    Leukemia Sister    Heart disease Sister    Prostate  cancer Other    Kidney cancer Other    Cancer Other        Bladder cancer   Coronary artery disease Other    Hypertension Sister    Hypertension Brother    Heart attack Neg Hx    Stroke Neg Hx     PE: BP 128/82 (BP Location: Right Arm, Patient Position: Sitting, Cuff Size: Normal)   Pulse 67   Ht _0  (1.778 m)  SpO2 97%   BMI 35.24 kg/m  Body mass index is 35.24 kg/m. 05/27/2021: Weight 223 pounds per his reports-refused being weighed Wt Readings from Last 3 Encounters:  04/24/21 245 lb 9.6 oz (111.4 kg)  04/17/21 243 lb 9.7 oz (110.5 kg)  02/26/21 243 lb 8 oz (110.5 kg)   Constitutional: overweight, in NAD Eyes: PERRLA, EOMI, no exophthalmos ENT: moist mucous membranes, no thyromegaly, no cervical lymphadenopathy Cardiovascular: RRR, No MRG Respiratory: CTA B Gastrointestinal: abdomen soft, NT, ND, BS+ Musculoskeletal: no deformities, strength intact in all 4 Skin: moist, warm, no rashes Neurological: + tremor with outstretched hands, DTR normal in all 4  ASSESSMENT: 1. DM2, insulin-dependent, uncontrolled, with complications - CAD, s/p stents - He had stenting of distal RCA/PDA in 07/2013. Prior stenting of LAD in 04/2013. 2 new stents: PCI to left circumflex and the mid RCA on 07/30/2016. - AAA - CKD stage 3  2. HL  3.  Obesity class II BMI Classification: < 18.5 underweight  18.5-24.9 normal weight  25.0-29.9 overweight  30.0-34.9 class I obesity  35.0-39.9 class II obesity  ? 40.0 class III obesity   PLAN:  1. Patient with history of uncontrolled type 2 diabetes, on basal-bolus insulin regimen, with fairly good control in the last several years, especially after switching to a more plant-based diet in the past. However, since then, he reintroduced eggs, bacon, sausage in the morning and sugars did increase afterwards.  At last visit, sugars were higher than goal throughout the day but upon questioning he was not taking his regular insulin if sugars were  lower than 120.  We discussed about taking this but adjusting the dose down if sugars are lower.  We did not change his NPH dose.  HbA1c was 6.7%, slightly higher. -At today's visit, sugars appear to be less fluctuating, with no lows and with only occasional blood sugars higher than 170s. -He is using the adjustment of doses of regular insulin recommended at last visit.  He feels that this is working well for him.  I agree and would not suggest to change the regimen at this point. - I advised him to: Patient Instructions  Please continue: Insulin Before breakfast Before lunch Before dinner  Regular _0 NPH 40  40   If the sugars before the meal are: <80: skip the R insulin before that meal 80-100: take only 10 units of R insulin 101-120: take only 15 units of R insulin  Otherwise, take the full 30 units dose.  If you take dinnertime insulin after dinner, take 50% of the dose.  Please return in 4 months with your sugar log.   - we checked his HbA1c: 6.0% (improved) - advised to check sugars at different times of the day - 4x a day, rotating check times - advised for yearly eye exams >> he is UTD - return to clinic in 4 months  2. HL - reviewed latest lipid panel from 08/2020: LDL at goal, triglycerides high, HDL low: Lab Results  Component Value Date   CHOL 128 08/28/2020   HDL 25 (L) 08/28/2020   LDLCALC 64 08/28/2020   LDLDIRECT 57.0 11/21/2019   TRIG 240 (H) 08/28/2020   CHOLHDL 5.1 (H) 08/28/2020  -He continues on Lipitor 40 mg daily, fenofibrate 160 mg daily, flaxseed oil-without side effects  3.  Obesity class 2 -He did well in the past and a plant-based diet.  He is eating some meat now but continues to eat plenty of vegetables  and fruit. -He lost a significant amount of weight in the last 3 years.  Weight apparently lower by 22 pounds since last visit per her report, but he refused being weighed today.  [name], MD PhD [facility] Endocrinology

## 2021-05-28 ENCOUNTER — Telehealth: Payer: Self-pay

## 2021-05-28 NOTE — Telephone Encounter (Signed)
Pt and his wife Mardene Celeste called. Pt reports he picked up his nitroglycerin and the prescription was for 100 tablets. Nurse confirmed with patient that prescription instructions were correct. Pt wanted to clarify with Dr. Stanford Breed why 100 tablets were sent in, as he feels this is too many to use. Pt reports for his next refill he would like fewer tablets. Advised pt he should call pharmacy about changing number for next refill. Pt confirmed he has enough medication at this time. Pt verbalized understanding of instruction.

## 2021-06-11 ENCOUNTER — Ambulatory Visit (INDEPENDENT_AMBULATORY_CARE_PROVIDER_SITE_OTHER): Payer: Medicare HMO | Admitting: Pharmacist

## 2021-06-11 ENCOUNTER — Telehealth: Payer: Medicare HMO

## 2021-06-11 DIAGNOSIS — Z794 Long term (current) use of insulin: Secondary | ICD-10-CM | POA: Diagnosis not present

## 2021-06-11 DIAGNOSIS — E785 Hyperlipidemia, unspecified: Secondary | ICD-10-CM | POA: Diagnosis not present

## 2021-06-11 DIAGNOSIS — I10 Essential (primary) hypertension: Secondary | ICD-10-CM | POA: Diagnosis not present

## 2021-06-11 DIAGNOSIS — N138 Other obstructive and reflux uropathy: Secondary | ICD-10-CM | POA: Diagnosis not present

## 2021-06-11 DIAGNOSIS — E118 Type 2 diabetes mellitus with unspecified complications: Secondary | ICD-10-CM | POA: Diagnosis not present

## 2021-06-11 DIAGNOSIS — N401 Enlarged prostate with lower urinary tract symptoms: Secondary | ICD-10-CM

## 2021-06-11 DIAGNOSIS — X12XXXA Contact with other hot fluids, initial encounter: Secondary | ICD-10-CM

## 2021-06-11 DIAGNOSIS — T300 Burn of unspecified body region, unspecified degree: Secondary | ICD-10-CM

## 2021-06-11 DIAGNOSIS — M48062 Spinal stenosis, lumbar region with neurogenic claudication: Secondary | ICD-10-CM
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
## 2021-06-11 NOTE — Patient Instructions (Signed)
Visit Information  PATIENT GOALS:  Goals Addressed             This Visit's Progress    Chronic Care Management Pharmacy Care Plan   On track    [place] (see longitudinal plan of care for additional care plan information)  Current Barriers:  Chronic Disease Management support, education, and care coordination needs related to Hypertension, Hyperlipidemia/CAD, Diabetes, GERD, Depression, Insomnia, BPH, Gout, Lumbar Radiculopathy   Hypertension / Heart Failure: BP Readings from Last 3 Encounters:  05/27/21 128/82  04/24/21 124/64  04/18/21 137/64  Pharmacist Clinical Goal(s): Over the next 90 days, patient will work with PharmD and providers to maintain BP goal <130/80 Current regimen:  Metoprolol Tartrate '25mg'$  - take 0.5 tablet (= 12.'5mg'$ ) twice daily  Amlodipine '5mg'$  daily  Interventions: Discussed blood pressure goal Recommended continued monitoring blood pressure 3 to 4 times per week Patient self care activities - Over the next 90 days, patient will: Check blood pressure 3 to 4 times per week, document, and provide at future appointments Ensure daily salt intake < 2300 mg/day Continue current medications for lowering blood pressure  Hyperlipidemia, mixed (elevated LDL and triglycerides) Lab Results  Component Value Date/Time   LDLCALC 64 08/28/2020 11:23 AM   LDLDIRECT 57.0 11/21/2019 08:24 AM  Pharmacist Clinical Goal(s): Over the next 90 days, patient will work with PharmD and providers to maintain LDL goal < 70, and achieve Triglyceride goal < 150 Current regimen:  Atorvastatin '40mg'$  daily Fenofibrate '160mg'$  daily Aspirin '81mg'$  daily Isosorbide Mononitrate '30mg'$  daily Nitriglycerine - place 1 tablet under tongue as needed for chest pain, may repeat in 5 minutes if chest pain continues but if 2nd dose is needed, call 911 for assistance.  Interventions: Discussed LDL and triglyceride goal Discussed dietary modifications to lower triglycerides Patient self care  activities - Over the next 90 days, patient will: Maintain current cholesterol and heart medication regimen.  Work at eating less saturated fats and fried foods.  Diabetes Lab Results  Component Value Date/Time   HGBA1C 6.0 (A) 05/27/2021 03:31 PM   HGBA1C 6.7 (A) 01/14/2021 02:56 PM   HGBA1C 8.0 (H) 11/21/2019 08:24 AM   HGBA1C 7.2 (H) 08/14/2019 08:12 AM  Pharmacist Clinical Goal(s): Over the next 90 days, patient will work with PharmD and providers to maintain A1c goal <7% Current regimen:  Novolin N 40 units each morning and each evening Novolin R 10 to 30 units prior to each meal based on blood glucose and sliding scale recommended by Dr Cruzita Lederer) Interventions: Discussed home blood glucose and A1c goal Reviewed home blood glucose readings and reviewed goals  Fasting blood glucose goal (before meals) = 80 to 130 Blood glucose goal after a meal = less than 180  Reviewed how to identify and treat low blood glucose. Patient self care activities - Over the next 90 days, patient will: Check blood sugar 3 to 4 times daily, before meals; document, and provide at future appointments Contact provider with any episodes of hypoglycemia  Lumbar Radiculopathy / chronic pain / burn wounds Pharmacist Clinical Goal(s) Over the next 90 days, patient will work with PharmD and providers to reduce pain  Current regimen:  Hydrocodone/APAP 5/'325mg'$  1/2  to 1 tablet at bedtime as needed Interventions: Evaluated for pain control Message sent to PCP regarding patient's concern with blisters around new skin and if antibiotic / office visit is needed.  Patient self care activities - Over the next 90 days, patient will: Maintain pain medication regimen  Follow up with Dr Ellene Route / neurosurgeon  Medication management Pharmacist Clinical Goal(s): Over the next 90 days, patient will work with PharmD and providers to maintain optimal medication adherence Current pharmacy: [facility]  Order Interventions Comprehensive medication review performed. Continue current medication management strategy Patient self care activities - Over the next 90 days, patient will: Focus on medication adherence by filling and taking medications appropriately  Take medications as prescribed Report any questions or concerns to PharmD and/or provider(s)  Please see past updates related to this goal by clicking on the "Past Updates" button in the selected goal          The patient verbalized understanding of instructions, educational materials, and care plan provided today and declined offer to receive copy of patient instructions, educational materials, and care plan.   Telephone follow up appointment with care management team member scheduled for: 3 months  Cherre Robins, PharmD Clinical Pharmacist [place] [facility] [place]

## 2021-06-26 ENCOUNTER — Encounter: Payer: Self-pay | Admitting: Family Medicine

## 2021-06-26 ENCOUNTER — Ambulatory Visit (INDEPENDENT_AMBULATORY_CARE_PROVIDER_SITE_OTHER): Payer: Medicare HMO | Admitting: Family Medicine

## 2021-06-26 ENCOUNTER — Other Ambulatory Visit: Payer: Self-pay

## 2021-06-26 VITALS — BP 142/47 | HR 68 | Temp 97.9°F | Resp 16

## 2021-06-26 DIAGNOSIS — Z23 Encounter for immunization: Secondary | ICD-10-CM | POA: Diagnosis not present

## 2021-06-26 DIAGNOSIS — M5416 Radiculopathy, lumbar region: Secondary | ICD-10-CM

## 2021-06-26 DIAGNOSIS — X12XXXA Contact with other hot fluids, initial encounter: Secondary | ICD-10-CM

## 2021-06-26 DIAGNOSIS — S99922A Unspecified injury of left foot, initial encounter: Secondary | ICD-10-CM

## 2021-06-26 DIAGNOSIS — N289 Disorder of kidney and ureter, unspecified: Secondary | ICD-10-CM | POA: Diagnosis not present

## 2021-06-26 DIAGNOSIS — E785 Hyperlipidemia, unspecified: Secondary | ICD-10-CM | POA: Diagnosis not present

## 2021-06-26 DIAGNOSIS — R0609 Other forms of dyspnea: Secondary | ICD-10-CM

## 2021-06-26 DIAGNOSIS — G6289 Other specified polyneuropathies: Secondary | ICD-10-CM

## 2021-06-26 DIAGNOSIS — I10 Essential (primary) hypertension: Secondary | ICD-10-CM | POA: Diagnosis not present

## 2021-06-26 DIAGNOSIS — M1000 Idiopathic gout, unspecified site: Secondary | ICD-10-CM | POA: Diagnosis not present

## 2021-06-26 DIAGNOSIS — N183 Chronic kidney disease, stage 3 unspecified: Secondary | ICD-10-CM

## 2021-06-26 DIAGNOSIS — T300 Burn of unspecified body region, unspecified degree: Secondary | ICD-10-CM | POA: Diagnosis not present

## 2021-06-26 LAB — COMPREHENSIVE METABOLIC PANEL
ALT: 18 U/L (ref 0–53)
AST: 19 U/L (ref 0–37)
Albumin: 3.9 g/dL (ref 3.5–5.2)
Alkaline Phosphatase: 53 U/L (ref 39–117)
BUN: 26 mg/dL — ABNORMAL HIGH (ref 6–23)
CO2: 32 mEq/L (ref 19–32)
Calcium: 9.9 mg/dL (ref 8.4–10.5)
Chloride: 102 mEq/L (ref 96–112)
Creatinine, Ser: 1.44 mg/dL (ref 0.40–1.50)
GFR: 45.64 mL/min — ABNORMAL LOW (ref >60.00)
Glucose, Bld: 182 mg/dL — ABNORMAL HIGH (ref 70–99)
Potassium: 4.3 mEq/L (ref 3.5–5.1)
Sodium: 140 mEq/L (ref 135–145)
Total Bilirubin: 0.4 mg/dL (ref 0.2–1.2)
Total Protein: 6.7 g/dL (ref 6.0–8.3)

## 2021-06-26 LAB — TSH: TSH: 2.82 u[IU]/mL (ref 0.35–5.50)

## 2021-06-26 LAB — LIPID PANEL
Cholesterol: 123 mg/dL (ref 0–200)
HDL: 27 mg/dL — ABNORMAL LOW (ref >39.00)
LDL Cholesterol: 58 mg/dL (ref 0–99)
NonHDL: 95.64
Total CHOL/HDL Ratio: 5
Triglycerides: 186 mg/dL — ABNORMAL HIGH (ref 0.0–149.0)
VLDL: 37.2 mg/dL (ref 0.0–40.0)

## 2021-06-26 LAB — CBC
HCT: 37.3 % — ABNORMAL LOW (ref 39.0–52.0)
Hemoglobin: 12.2 g/dL — ABNORMAL LOW (ref 13.0–17.0)
MCHC: 32.7 g/dL (ref 30.0–36.0)
MCV: 93.1 fl (ref 78.0–100.0)
Platelets: 196 10*3/uL (ref 150.0–400.0)
RBC: 4.01 Mil/uL — ABNORMAL LOW (ref 4.22–5.81)
RDW: 14.9 % (ref 11.5–15.5)
WBC: 6 10*3/uL (ref 4.0–10.5)

## 2021-06-26 LAB — URIC ACID: Uric Acid, Serum: 4.3 mg/dL (ref 4.0–7.8)

## 2021-06-26 MED ORDER — BACLOFEN 10 MG PO TABS: 10.0000 mg | ORAL_TABLET | Freq: Every evening | ORAL | 1 refills | Status: DC | PRN

## 2021-06-26 MED ORDER — HYDROCODONE-ACETAMINOPHEN 5-325 MG PO TABS: 0.5000 | ORAL_TABLET | Freq: Two times a day (BID) | ORAL | 0 refills | Status: DC | PRN

## 2021-06-26 NOTE — Progress Notes (Signed)
Subjective:   By signing my name below, I, Burnett Corrente, attest that this documentation has been prepared under the direction and in the presence of Penni Homans 06/26/2021   Patient ID: William Hendrix, male    DOB: 1941/03/23, 80 y.o.   MRN: [id number]  Chief Complaint  Patient presents with   Follow-up    HPI Patient is in today for office visit and other chronic medical concerns. He reports feeling well today. No recent febrile illness or hospitalizations. No c/o polyuria or polydipsia. He injured his left first and second toe recently but not sure exactly when as he has neuropathy and does not feel his feet well. His great toenail is loose. His low back pain continues to be a daily struggle with radicular pain into both legs but he has to put off surgery until his wife undergoes another surgery.   Shingles, vision exam, covid, flu vaccinations     Patient denies chest pains, shortness of breath, palpitations, chest tightness, blood in stool, dizziness, headaches, syncope, GU or GI c/o.  Past Medical History:  Diagnosis Date   AAA (abdominal aortic aneurysm) ([place]) 12/15/2014   a. Mild aneurysmal dilatation of the infrarenal abdominal aorta 3.2 cm - f/u due by 2019.   Arthritis    "all over" (07/30/2016)   CAD (coronary artery disease)    a. stent to LAD 2000. b. possible spasm by cath 2001. c. IVUS/PTCA/DES to mLAD 05/2013. d. PTCA/DES of dRCA into ostial rPDA 07/2013. e. PTCA of OM2, DES to Cx, DES to [facility] 07/2016.   Chronic bronchitis (HCC)    Chronic diastolic CHF (congestive heart failure) (HCC)    Chronic lower back pain    Chronic neck pain    CKD (chronic kidney disease), stage III (HCC)    Diabetic peripheral neuropathy ([place])    Ejection fraction    55%, 07/2010, mild inferior hypo   GERD (gastroesophageal reflux disease)    Gout    H/O diverticulitis of colon 01/31/2015   H/O hiatal hernia    History of blood transfusion ~ 10/1940   "had pneumonia"   History  of kidney stones    HTN (hypertension)    Hyperlipidemia    Melanoma of forearm, left ([place]) 02/2011   with wide excision    Myocardial infarction ([place])    Nephrolithiasis    Obesity    OSA on CPAP     [name] since 2000   Peripheral neuropathy    both feet   Positive D-dimer    a. significant elevation ,hospital 07/2010, etiology unclear. b. D Dimer chronically > 20.   Renal insufficiency    Skin cancer    Spinal stenosis    a. s/p surgical repair 2013   Spinal stenosis of lumbar region    Type II diabetes mellitus ([place])    Ventral hernia     Past Surgical History:  Procedure Laterality Date   ANTERIOR LAT LUMBAR FUSION  10/22/2017   Procedure: Lumbar two-three Lumbar three-four Lumbar four-five Anteriolateral lumbar interbody fusion with percutaneous pedicle screw fixation and infuse;  Surgeon: [name], MD;  Location: [place];  Service: Neurosurgery;;   APPLICATION OF ROBOTIC ASSISTANCE FOR SPINAL PROCEDURE  10/22/2017   Procedure: APPLICATION OF ROBOTIC ASSISTANCE FOR SPINAL PROCEDURE;  Surgeon: [name], MD;  Location: [place];  Service: Neurosurgery;;   BACK SURGERY     CARDIAC CATHETERIZATION N/A 07/30/2016   Procedure: Left Heart Cath and Coronary Angiography;  Surgeon: Harrell Gave  End, MD;  Location: [place] CV LAB;  Service: Cardiovascular;  Laterality: N/A;   CARDIAC CATHETERIZATION N/A 07/30/2016   Procedure: Coronary Stent Intervention;  Surgeon: Nelva Bush, MD;  Location: [place] CV LAB;  Service: Cardiovascular;  Laterality: N/A;  Mid CFX and MID RCA   CARDIAC CATHETERIZATION N/A 07/30/2016   Procedure: Coronary Balloon Angioplasty;  Surgeon: Nelva Bush, MD;  Location: [place] CV LAB;  Service: Cardiovascular;  Laterality: N/A;  OM 1   CARPAL TUNNEL RELEASE Left    CATARACT EXTRACTION W/ INTRAOCULAR LENS  IMPLANT, BILATERAL Bilateral ~ 2014   CERVICAL DISC SURGERY  1990s   CORONARY ANGIOPLASTY WITH STENT PLACEMENT  05/2013   CORONARY  ANGIOPLASTY WITH STENT PLACEMENT  07/26/2013   DES to RCA extending to PDA     CORONARY ANGIOPLASTY WITH STENT PLACEMENT  07/30/2016   CORONARY ANGIOPLASTY WITH STENT PLACEMENT  2000   CAD   DENTAL SURGERY  04/2016   "got infected; had to dig it out"   EYE SURGERY     KNEE ARTHROSCOPY Right 1990's   rt   LEFT AND RIGHT HEART CATHETERIZATION WITH CORONARY ANGIOGRAM N/A 07/26/2013   Procedure: LEFT AND RIGHT HEART CATHETERIZATION WITH CORONARY ANGIOGRAM;  Surgeon: [name], MD;  Location: [place] CATH LAB;  Service: Cardiovascular;  Laterality: N/A;   LEFT HEART CATH AND CORONARY ANGIOGRAPHY N/A 02/26/2017   Procedure: Left Heart Cath and Coronary Angiography;  Surgeon: Sherren Mocha, MD;  Location: [place] [facility];  Service: Cardiovascular;  Laterality: N/A;   LEFT HEART CATHETERIZATION WITH CORONARY ANGIOGRAM N/A 05/19/2013   Procedure: LEFT HEART CATHETERIZATION WITH CORONARY ANGIOGRAM;  Surgeon: [name], MD;  Location: [facility] CATH LAB;  Service: Cardiovascular;  Laterality: N/A;   LUMBAR LAMINECTOMY/DECOMPRESSION MICRODISCECTOMY  08/30/2012   Procedure: LUMBAR LAMINECTOMY/DECOMPRESSION MICRODISCECTOMY 2 LEVELS;  Surgeon: [name], MD;  Location: [place] [facility];  Service: Neurosurgery;  Laterality: Bilateral;  Bilateral Lumbar three-four Lumbar four-five Laminotomies   LUMBAR PERCUTANEOUS PEDICLE SCREW 1 LEVEL Bilateral 10/22/2017   Procedure: LUMBAR PERCUTANEOUS PEDICLE SCREW 1 LEVEL;  Surgeon: [name], MD;  Location: [place];  Service: Neurosurgery;  Laterality: Bilateral;   LUMBAR SPINE SURGERY  04/07/2016   Dr. Ellene Route; "?ruptured disc"   MELANOMA EXCISION Left 02/2011   forearm   NASAL SINUS SURGERY  1970s   "cut windows in sinus pockets"   PERCUTANEOUS CORONARY STENT INTERVENTION (PCI-S)  05/19/2013   Procedure: PERCUTANEOUS CORONARY STENT INTERVENTION (PCI-S);  Surgeon: [name], MD;  Location: [facility] CATH LAB;  Service: Cardiovascular;;   ULNAR TUNNEL RELEASE Left  04/07/2016   Dr. Ellene Route    Family History  Problem Relation Age of Onset   Cancer Mother        intestinal    Heart disease Mother    Cancer Father        prostate   Migraines Daughter    Leukemia Sister    Heart disease Sister    Prostate cancer Other    Kidney cancer Other    Cancer Other        Bladder cancer   Coronary artery disease Other    Hypertension Sister    Hypertension Brother    Heart attack Neg Hx    Stroke Neg Hx     Social History   Socioeconomic History   Marital status: Married    Spouse name: Not on file   Number of children: Not on file   Years of education: Not  on file   Highest education level: Not on file  Occupational History   Occupation: Painter  Tobacco Use   Smoking status: Former    Packs/day: 3.00    Years: 30.00    Pack years: 90.00    Types: Cigarettes    Quit date: 09/17/1996    Years since quitting: 24.7   Smokeless tobacco: Never  Vaping Use   Vaping Use: Never used  Substance and Sexual Activity   Alcohol use: No   Drug use: No   Sexual activity: Not on file  Other Topics Concern   Not on file  Social History Narrative   Married and lives locally with his wife.  Sharyon Cable.   Social Determinants of Health   Financial Resource Strain: Low Risk    Difficulty of Paying Living Expenses: Not very hard  Food Insecurity: Not on file  Transportation Needs: No Transportation Needs   Lack of Transportation (Medical): No   Lack of Transportation (Non-Medical): No  Physical Activity: Inactive   Days of Exercise per Week: 0 days   Minutes of Exercise per Session: 0 min  Stress: Not on file  Social Connections: Not on file  Intimate Partner Violence: Not on file    Outpatient Medications Prior to Visit  Medication Sig Dispense Refill   acetaminophen (TYLENOL) 500 MG tablet Take 500 mg by mouth 2 (two) times daily.      alfuzosin (UROXATRAL) 10 MG 24 hr tablet Take 10 mg by mouth in the morning and at bedtime.      allopurinol (ZYLOPRIM) 300 MG tablet TAKE 1 TABLET EVERY DAY 90 tablet 1   amLODipine (NORVASC) 5 MG tablet Take 1 tablet (5 mg total) by mouth daily. 90 tablet 3   aspirin EC 81 MG tablet Take 81 mg by mouth daily.     atorvastatin (LIPITOR) 40 MG tablet Take 1 tablet (40 mg total) by mouth daily. 90 tablet 3   B Complex Vitamins (B COMPLEX-B12 PO) Take 1 tablet by mouth daily.      Blood Glucose Monitoring Suppl (TRUE METRIX METER) w/Device KIT USE AS DIRECTED  TO TEST BLOOD GLUCOSE 1 kit 0   Chlorpheniramine Maleate (CHLOR-TRIMETON ALLERGY PO) Take 1 tablet by mouth 2 (two) times daily as needed (allergies).     Cholecalciferol (VITAMIN D3) 50 MCG (2000 UT) capsule Take 2,000 Units by mouth at bedtime.      DROPLET INSULIN SYRINGE 31G X 5/16" 1 ML MISC USE TO INJECT INSULIN 5 TIMES DAILY 500 each 0   fenofibrate 160 MG tablet TAKE 1 TABLET AT BEDTIME 90 tablet 1   fluocinonide cream (LIDEX) 5.28 % Apply 1 application topically daily as needed (for rashes).      fluticasone (FLONASE) 50 MCG/ACT nasal spray Place 2 sprays into both nostrils daily. (Patient taking differently: Place 2 sprays into both nostrils daily as needed for allergies.) 16 g 1   furosemide (LASIX) 40 MG tablet TAKE 2 TABLETS (80 MG TOTAL) BY MOUTH DAILY. (Patient taking differently: Take 80 mg by mouth. Every other morning) 180 tablet 3   insulin NPH Human (NOVOLIN N) 100 UNIT/ML injection Inject 0.5 mLs (50 Units total) into the skin 2 (two) times daily before a meal. (Patient taking differently: Inject 40 Units into the skin 2 (two) times daily before a meal.) 10 mL 11   insulin regular (NOVOLIN R RELION) 100 units/mL injection Inject 25-30 units 3 times a day before meals. 60 mL 1   Insulin Syringe-Needle U-100 (  BD INSULIN SYRINGE U/F) 31G X 5/16" 0.3 ML MISC Use to inject insulin 5 times a day. 500 each 11   MAGNESIUM PO Take 1 tablet by mouth at bedtime.     Melatonin 10 MG TABS Take 10 mg by mouth at bedtime.      metoprolol tartrate (LOPRESSOR) 25 MG tablet Take 1 tablet (25 mg total) by mouth 2 (two) times daily. 180 tablet 1   nitroGLYCERIN (NITROSTAT) 0.4 MG SL tablet Place 1 tablet (0.4 mg total) under the tongue every 5 (five) minutes as needed for chest pain. 100 tablet 3   pantoprazole (PROTONIX) 20 MG tablet TAKE 1 TABLET EVERY DAY 90 tablet 3   Probiotic Product (PROBIOTIC PO) Take 1 capsule by mouth at bedtime.      traMADol (ULTRAM) 50 MG tablet Take 1 tablet (50 mg total) by mouth 2 (two) times daily as needed for moderate pain or severe pain. 60 tablet 0   TRUE METRIX BLOOD GLUCOSE TEST test strip TEST BLOOD SUGAR THREE TIMES DAILY AS DIRECTED 300 strip 11   TRUEplus Lancets 33G MISC USE TO CHECK BLOOD SUGAR 3 TIMES A DAY 300 each 3   HYDROcodone-acetaminophen (NORCO) 5-325 MG tablet Take 0.5-1 tablets by mouth 2 (two) times daily as needed for moderate pain. 60 tablet 0   tiZANidine (ZANAFLEX) 4 MG tablet Take 1 tablet (4 mg total) by mouth at bedtime as needed for muscle spasms. 30 tablet 5   isosorbide mononitrate (IMDUR) 30 MG 24 hr tablet TAKE 1 TABLET (30 MG TOTAL) BY MOUTH DAILY. 90 tablet 3   cephALEXin (KEFLEX) 500 MG capsule Take 1 capsule (500 mg total) by mouth 4 (four) times daily. (Patient not taking: Reported on 06/11/2021) 28 capsule 0   No facility-administered medications prior to visit.    Allergies  Allergen Reactions   Brilinta [Ticagrelor] Shortness Of Breath   Insulin Detemir Swelling, Other (See Comments) and Itching    Patient had redness and swelling and tenderness at injection site. Patient had redness and swelling and tenderness at injection site.   Bee Venom    Codeine Other (See Comments)    Makes him "shakey", is OK with hydrocodone GI UPSET & TREMORS Makes him "shakey", is OK with hydrocodone Makes him "shakey", is OK with hydrocodone GI UPSET & TREMORS Other reaction(s): GI issues   Gadobenate     Other reaction(s): fever, sweats and nausea   Insulin  Aspart    Lipitor [Atorvastatin] Other (See Comments)    Muscle aches; affects liver function- Patient is currently taking   Novolog Mix [Insulin Aspart Prot & Aspart] Other (See Comments)    Causes skin to swell/itch at injection site    Review of Systems  Constitutional:  Negative for chills, fever and malaise/fatigue.  HENT:  Negative for congestion, hearing loss and sore throat.   Eyes:  Negative for blurred vision, pain and redness.  Respiratory:  Negative for cough, sputum production and shortness of breath.   Cardiovascular:  Negative for chest pain, palpitations and leg swelling.  Gastrointestinal:  Negative for blood in stool, diarrhea and nausea.  Genitourinary:  Negative for dysuria, frequency and urgency.  Musculoskeletal:  Positive for back pain, joint pain and myalgias. Negative for falls.  Neurological:  Positive for sensory change. Negative for dizziness, weakness and headaches.  Psychiatric/Behavioral:  Negative for depression. The patient is not nervous/anxious and does not have insomnia.       Objective:    Physical Exam Constitutional:  General: He is not in acute distress.    Appearance: Normal appearance. He is not ill-appearing.  HENT:     Head: Normocephalic and atraumatic.     Right Ear: Tympanic membrane and external ear normal.     Left Ear: Tympanic membrane and external ear normal.     Nose: Nose normal. No congestion.  Eyes:     General: No scleral icterus.    Extraocular Movements: Extraocular movements intact.     Pupils: Pupils are equal, round, and reactive to light.  Cardiovascular:     Rate and Rhythm: Normal rate and regular rhythm.     Pulses: Normal pulses.     Heart sounds: Normal heart sounds. No murmur heard. Pulmonary:     Effort: Pulmonary effort is normal.     Breath sounds: Normal breath sounds.  Abdominal:     General: Abdomen is flat.     Palpations: Abdomen is soft.     Tenderness: There is no right CVA tenderness or  left CVA tenderness.  Musculoskeletal:        General: Normal range of motion.     Cervical back: Normal range of motion and neck supple.     Right lower leg: No edema.     Left lower leg: No edema.     Comments: Left great toenail loose and bruised, second toenail bruised  Skin:    General: Skin is warm and dry.  Neurological:     General: No focal deficit present.     Mental Status: He is alert and oriented to person, place, and time.     Deep Tendon Reflexes: Reflexes normal.  Psychiatric:        Mood and Affect: Mood normal.        Behavior: Behavior normal.    BP (!) 142/47   Pulse 68   Temp 97.9 F (36.6 C)   Resp 16   SpO2 99%  Wt Readings from Last 3 Encounters:  04/24/21 245 lb 9.6 oz (111.4 kg)  04/17/21 243 lb 9.7 oz (110.5 kg)  02/26/21 243 lb 8 oz (110.5 kg)    Diabetic Foot Exam - Simple   No data filed    Lab Results  Component Value Date   WBC 6.0 06/26/2021   HGB 12.2 (L) 06/26/2021   HCT 37.3 (L) 06/26/2021   PLT 196.0 06/26/2021   GLUCOSE 182 (H) 06/26/2021   CHOL 123 06/26/2021   TRIG 186.0 (H) 06/26/2021   HDL 27.00 (L) 06/26/2021   LDLDIRECT 57.0 11/21/2019   LDLCALC 58 06/26/2021   ALT 18 06/26/2021   AST 19 06/26/2021   NA 140 06/26/2021   K 4.3 06/26/2021   CL 102 06/26/2021   CREATININE 1.44 06/26/2021   BUN 26 (H) 06/26/2021   CO2 32 06/26/2021   TSH 2.82 06/26/2021   PSA 1.01 11/02/2018   INR 1.1 02/24/2017   HGBA1C 6.0 (A) 05/27/2021   MICROALBUR 0.7 08/02/2014    Lab Results  Component Value Date   TSH 2.82 06/26/2021   Lab Results  Component Value Date   WBC 6.0 06/26/2021   HGB 12.2 (L) 06/26/2021   HCT 37.3 (L) 06/26/2021   MCV 93.1 06/26/2021   PLT 196.0 06/26/2021   Lab Results  Component Value Date   NA 140 06/26/2021   K 4.3 06/26/2021   CO2 32 06/26/2021   GLUCOSE 182 (H) 06/26/2021   BUN 26 (H) 06/26/2021   CREATININE 1.44 06/26/2021   BILITOT 0.4  06/26/2021   ALKPHOS 53 06/26/2021   AST 19  06/26/2021   ALT 18 06/26/2021   PROT 6.7 06/26/2021   ALBUMIN 3.9 06/26/2021   CALCIUM 9.9 06/26/2021   ANIONGAP 9 04/17/2021   EGFR 48 (L) 02/26/2021   GFR 45.64 (L) 06/26/2021   Lab Results  Component Value Date   CHOL 123 06/26/2021   Lab Results  Component Value Date   HDL 27.00 (L) 06/26/2021   Lab Results  Component Value Date   LDLCALC 58 06/26/2021   Lab Results  Component Value Date   TRIG 186.0 (H) 06/26/2021   Lab Results  Component Value Date   CHOLHDL 5 06/26/2021   Lab Results  Component Value Date   HGBA1C 6.0 (A) 05/27/2021       Assessment & Plan:   Problem List Items Addressed This Visit     Gout - Primary   Relevant Orders   Uric acid (Completed)   Renal insufficiency    Hydrate and monitor      Hyperlipidemia    Encourage heart healthy diet such as MIND or DASH diet, increase exercise, avoid trans fats, simple carbohydrates and processed foods, consider a krill or fish or flaxseed oil cap daily. Tolerating Atorvastatin      Relevant Orders   Lipid panel (Completed)   Essential hypertension    Well controlled, no changes to meds. Encouraged heart healthy diet such as the DASH diet and exercise as tolerated.       Relevant Orders   CBC (Completed)   Comprehensive metabolic panel (Completed)   TSH (Completed)   Lumbar radiculopathy    He struggles with daily pain and he has surgery planned with Dr Roselee Culver of neurosurgery but now the patient's wife's back has gotten worse so he will have to put off his surgery while hers gets redone. Will try switching his muscle relaxer to Baclofen and see if that helps. Continue Hydrocodone prn. Encouraged moist heat and gentle stretching as tolerated. May try NSAIDs and prescription meds as directed and report if symptoms worsen or seek immediate care      Relevant Medications   baclofen (LIORESAL) 10 MG tablet   Dyspnea   Chronic renal failure, stage 3 (moderate) (HCC)    Hydrate and monitor       Burn by hot liquid    Healing up well. Did not ultimately need to be seen by wound clinic. He did injure the sore on his anterior plateau yesterday by hitting it and breaking open the skin but it does not appear infected. He will keep it clean and dry, open to air as much as he can keep it safe. Report change      Injury of left great toe    Due to neuropathy does not recall when he injured it but he has bruising of both first and second toenails and a loose first toenail. Is referred to podiatry for diabetic foot care and consideration of removal of great toenail      Relevant Orders   Ambulatory referral to Podiatry   Other Visit Diagnoses     Other polyneuropathy       Relevant Medications   baclofen (LIORESAL) 10 MG tablet   Other Relevant Orders   Ambulatory referral to Podiatry   Needs flu shot       Relevant Orders   Flu Vaccine QUAD High Dose(Fluad) (Completed)      F/U in 4 months  Meds ordered this encounter  Medications   baclofen (LIORESAL) 10 MG tablet    Sig: Take 1-2 tablets (10-20 mg total) by mouth at bedtime as needed for muscle spasms.    Dispense:  60 each    Refill:  1   HYDROcodone-acetaminophen (NORCO) 5-325 MG tablet    Sig: Take 0.5-1 tablets by mouth 2 (two) times daily as needed for moderate pain.    Dispense:  60 tablet    Refill:  0    I, Penni Homans, MD, personally preformed the services described in this documentation.  All medical record entries made by the scribe were at my direction and in my presence.  I have reviewed the chart and discharge instructions (if applicable) and agree that the record reflects my personal performance and is accurate and complete. Penni Homans 06/26/2021   I,Jada Bradford,acting as a scribe for Penni Homans, MD.,have documented all relevant documentation on the behalf of Penni Homans, MD,as directed by  Penni Homans, MD while in the presence of Penni Homans, MD.  I, [name], MD personally performed  the services described in this documentation. All medical record entries made by the scribe were at my direction and in my presence. I have reviewed the chart and agree that the record reflects my personal performance and is accurate and complete     Penni Homans, MD

## 2021-06-26 NOTE — Patient Instructions (Addendum)
Paxlovid is the new COVID medication we can give you if you get COVID so make sure you test if you have symptoms because we have to treat by day 5 of symptoms for it to be effective. If you are positive let us know so we can treat. If a home test is negative and your symptoms are persistent get a PCR test. Can check testing locations at [email or web address] If you are positive we will make an appointment with [place] and we will send in Paxlovid if you would like it. Check with your pharmacy before we meet to confirm they have it in stock, if they do not then we can get the prescription at the [facility]    Acute Back Pain, Adult Acute back pain is sudden and usually short-lived. It is often caused by an injury to the muscles and tissues in the back. The injury may result from: A muscle, tendon, or ligament getting overstretched or torn. Ligaments are tissues that connect bones to each other. Lifting something improperly can cause a back strain. Wear and tear (degeneration) of the spinal disks. Spinal disks are circular tissue that provide cushioning between the bones of the spine (vertebrae). Twisting motions, such as while playing sports or doing yard work. A hit to the back. Arthritis. You may have a physical exam, lab tests, and imaging tests to find the cause of your pain. Acute back pain usually goes away with rest and home care. Follow these instructions at home: Managing pain, stiffness, and swelling Take over-the-counter and prescription medicines only as told by your health care provider. Treatment may include medicines for pain and inflammation that are taken by mouth or applied to the skin, or muscle relaxants. Your health care provider may recommend applying ice during the first 24-48 hours after your pain starts. To do this: Put ice in a plastic bag. Place a towel between your skin and the bag. Leave the ice on for 20 minutes, 2-3 times a day. Remove the ice if your skin  turns bright red. This is very important. If you cannot feel pain, heat, or cold, you have a greater risk of damage to the area. If directed, apply heat to the affected area as often as told by your health care provider. Use the heat source that your health care provider recommends, such as a moist heat pack or a heating pad. Place a towel between your skin and the heat source. Leave the heat on for 20-30 minutes. Remove the heat if your skin turns bright red. This is especially important if you are unable to feel pain, heat, or cold. You have a greater risk of getting burned. Activity  Do not stay in bed. Staying in bed for more than 1-2 days can delay your recovery. Sit up and stand up straight. Avoid leaning forward when you sit or hunching over when you stand. If you work at a desk, sit close to it so you do not need to lean over. Keep your chin tucked in. Keep your neck drawn back, and keep your elbows bent at a 90-degree angle (right angle). Sit high and close to the steering wheel when you drive. Add lower back (lumbar) support to your car seat, if needed. Take short walks on even surfaces as soon as you are able. Try to increase the length of time you walk each day. Do not sit, drive, or stand in one place for more than 30 minutes at a time. Sitting or standing  for long periods of time can put stress on your back. Do not drive or use heavy machinery while taking prescription pain medicine. Use proper lifting techniques. When you bend and lift, use positions that put less stress on your back: Hadar your knees. Keep the load close to your body. Avoid twisting. Exercise regularly as told by your health care provider. Exercising helps your back heal faster and helps prevent back injuries by keeping muscles strong and flexible. Work with a physical therapist to make a safe exercise program, as recommended by your health care provider. Do any exercises as told by your physical  therapist. Lifestyle Maintain a healthy weight. Extra weight puts stress on your back and makes it difficult to have good posture. Avoid activities or situations that make you feel anxious or stressed. Stress and anxiety increase muscle tension and can make back pain worse. Learn ways to manage anxiety and stress, such as through exercise. General instructions Sleep on a firm mattress in a comfortable position. Try lying on your side with your knees slightly bent. If you lie on your back, put a pillow under your knees. Keep your head and neck in a straight line with your spine (neutral position) when using electronic equipment like smartphones or pads. To do this: Raise your smartphone or pad to look at it instead of bending your head or neck to look down. Put the smartphone or pad at the level of your face while looking at the screen. Follow your treatment plan as told by your health care provider. This may include: Cognitive or behavioral therapy. Acupuncture or massage therapy. Meditation or yoga. Contact a health care provider if: You have pain that is not relieved with rest or medicine. You have increasing pain going down into your legs or buttocks. Your pain does not improve after 2 weeks. You have pain at night. You lose weight without trying. You have a fever or chills. You develop nausea or vomiting. You develop abdominal pain. Get help right away if: You develop new bowel or bladder control problems. You have unusual weakness or numbness in your arms or legs. You feel faint. These symptoms may represent a serious problem that is an emergency. Do not wait to see if the symptoms will go away. Get medical help right away. Call your local emergency services (911 in the [place] Do not drive yourself to the hospital. Summary Acute back pain is sudden and usually short-lived. Use proper lifting techniques. When you bend and lift, use positions that put less stress on your back. Take  over-the-counter and prescription medicines only as told by your health care provider, and apply heat or ice as told. This information is not intended to replace advice given to you by your health care provider. Make sure you discuss any questions you have with your health care provider. Document Revised: 12/20/2020 Document Reviewed: 12/20/2020 Elsevier Patient Education  [employer].

## 2021-06-27 DIAGNOSIS — S99922A Unspecified injury of left foot, initial encounter: Secondary | ICD-10-CM | POA: Insufficient documentation

## 2021-06-27 NOTE — Assessment & Plan Note (Signed)
Healing up well. Did not ultimately need to be seen by wound clinic. He did injure the sore on his anterior plateau yesterday by hitting it and breaking open the skin but it does not appear infected. He will keep it clean and dry, open to air as much as he can keep it safe. Report change

## 2021-06-27 NOTE — Assessment & Plan Note (Signed)
Hydrate and monitor 

## 2021-06-27 NOTE — Assessment & Plan Note (Signed)
Encourage heart healthy diet such as MIND or DASH diet, increase exercise, avoid trans fats, simple carbohydrates and processed foods, consider a krill or fish or flaxseed oil cap daily. Tolerating Atorvastatin 

## 2021-06-27 NOTE — Assessment & Plan Note (Signed)
He struggles with daily pain and he has surgery planned with Dr Roselee Culver of neurosurgery but now the patient's wife's back has gotten worse so he will have to put off his surgery while hers gets redone. Will try switching his muscle relaxer to Baclofen and see if that helps. Continue Hydrocodone prn. Encouraged moist heat and gentle stretching as tolerated. May try NSAIDs and prescription meds as directed and report if symptoms worsen or seek immediate care

## 2021-06-27 NOTE — Assessment & Plan Note (Signed)
Due to neuropathy does not recall when he injured it but he has bruising of both first and second toenails and a loose first toenail. Is referred to podiatry for diabetic foot care and consideration of removal of great toenail

## 2021-06-27 NOTE — Assessment & Plan Note (Signed)
Well controlled, no changes to meds. Encouraged heart healthy diet such as the DASH diet and exercise as tolerated.  °

## 2021-07-02 ENCOUNTER — Ambulatory Visit: Payer: Medicare HMO | Admitting: Podiatry

## 2021-07-02 ENCOUNTER — Other Ambulatory Visit: Payer: Self-pay

## 2021-07-02 DIAGNOSIS — E0843 Diabetes mellitus due to underlying condition with diabetic autonomic (poly)neuropathy: Secondary | ICD-10-CM | POA: Diagnosis not present

## 2021-07-02 DIAGNOSIS — M79674 Pain in right toe(s): Secondary | ICD-10-CM

## 2021-07-02 DIAGNOSIS — M79675 Pain in left toe(s): Secondary | ICD-10-CM | POA: Diagnosis not present

## 2021-07-02 DIAGNOSIS — S91202A Unspecified open wound of left great toe with damage to nail, initial encounter: Secondary | ICD-10-CM | POA: Diagnosis not present

## 2021-07-02 DIAGNOSIS — B351 Tinea unguium: Secondary | ICD-10-CM

## 2021-07-02 NOTE — Progress Notes (Signed)
SUBJECTIVE Patient with a history of diabetes mellitus presents to office today complaining of elongated, thickened nails that cause pain while ambulating in shoes.  Patient is unable to trim their own nails.   Patient also states that he has balance issues and constantly trips and stubs his toe.  He has noticed some bleeding to the left hallux nail plate.  He says that it is loose.  He has not done anything for treatment.  He is completely neuropathic and does not feel any pain associated to the area patient is here for further evaluation and treatment.   Past Medical History:  Diagnosis Date   AAA (abdominal aortic aneurysm) ([place]) 12/15/2014   a. Mild aneurysmal dilatation of the infrarenal abdominal aorta 3.2 cm - f/u due by 2019.   Arthritis    "all over" (07/30/2016)   CAD (coronary artery disease)    a. stent to LAD 2000. b. possible spasm by cath 2001. c. IVUS/PTCA/DES to mLAD 05/2013. d. PTCA/DES of dRCA into ostial rPDA 07/2013. e. PTCA of OM2, DES to Cx, DES to [facility] 07/2016.   Chronic bronchitis (HCC)    Chronic diastolic CHF (congestive heart failure) (HCC)    Chronic lower back pain    Chronic neck pain    CKD (chronic kidney disease), stage III (HCC)    Diabetic peripheral neuropathy ([place])    Ejection fraction    55%, 07/2010, mild inferior hypo   GERD (gastroesophageal reflux disease)    Gout    H/O diverticulitis of colon 01/31/2015   H/O hiatal hernia    History of blood transfusion ~ 10/1940   "had pneumonia"   History of kidney stones    HTN (hypertension)    Hyperlipidemia    Melanoma of forearm, left ([place]) 02/2011   with wide excision    Myocardial infarction ([place])    Nephrolithiasis    Obesity    OSA on CPAP     [name] since 2000   Peripheral neuropathy    both feet   Positive D-dimer    a. significant elevation ,hospital 07/2010, etiology unclear. b. D Dimer chronically > 20.   Renal insufficiency    Skin cancer    Spinal stenosis    a. s/p  surgical repair 2013   Spinal stenosis of lumbar region    Type II diabetes mellitus ([place])    Ventral hernia     OBJECTIVE General Patient is awake, alert, and oriented x 3 and in no acute distress. Derm Skin is dry and supple bilateral. Negative open lesions or macerations. Remaining integument unremarkable. Nails are tender, long, thickened and dystrophic with subungual debris, consistent with onychomycosis, 1-5 bilateral. No signs of infection noted. Loosely adhered nail plate with subungual bleeding noted to the left hallux nail plate.  The wound base appears healthy and stable. Vasc  DP and PT pedal pulses palpable bilaterally. Temperature gradient within normal limits.  Neuro Epicritic and protective threshold sensation diminished bilaterally.  Musculoskeletal Exam No symptomatic pedal deformities noted bilateral. Muscular strength within normal limits.  ASSESSMENT 1. Diabetes Mellitus w/ peripheral neuropathy 2.  Pain due to onychomycosis of toenails bilateral 3.  Loosely adhered traumatic injury of the left hallux nail plate  PLAN OF CARE 1. Patient evaluated today. 2. Instructed to maintain good pedal hygiene and foot care. Stressed importance of controlling blood sugar.  3. Mechanical debridement of nails 1-5 bilaterally performed using a nail nipper. Filed with dremel without incident.  4.  Total temporary  nail avulsion was performed to the left hallux nail plate.  No local anesthesia was utilized since the patient is completely neuropathic.  The nail plate was removed in toto.  Light dressing applied with post care instructions provided  5.  Silvadene cream provided for the patient to apply daily  6.  Return to clinic in 3 mos.     Edrick Kins, DPM [facility]  Dr. Edrick Kins, DPM    [address],  [address]                Office [phone]  Fax [phone]

## 2021-07-28 ENCOUNTER — Other Ambulatory Visit: Payer: Self-pay | Admitting: Internal Medicine

## 2021-07-31 ENCOUNTER — Telehealth: Payer: Self-pay
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
## 2021-08-01 ENCOUNTER — Telehealth (INDEPENDENT_AMBULATORY_CARE_PROVIDER_SITE_OTHER): Payer: Medicare HMO | Admitting: Medical

## 2021-08-01 ENCOUNTER — Telehealth: Payer: Self-pay | Admitting: Pharmacist

## 2021-08-01 ENCOUNTER — Other Ambulatory Visit: Payer: Self-pay

## 2021-08-01 ENCOUNTER — Encounter: Payer: Self-pay | Admitting: Medical

## 2021-08-01 VITALS — BP 137/70 | HR 59 | Temp 98.1°F | Ht 70.0 in | Wt 240.6 lb

## 2021-08-01 DIAGNOSIS — J029 Acute pharyngitis, unspecified: Secondary | ICD-10-CM

## 2021-08-01 LAB — POCT RAPID STREP A (OFFICE): Rapid Strep A Screen: NEGATIVE

## 2021-08-01 MED ORDER — AZITHROMYCIN 250 MG PO TABS: ORAL_TABLET | ORAL | 0 refills | Status: AC

## 2021-08-01 NOTE — Patient Instructions (Signed)
Onset of early moderate st with tonsil hypertrophy. We did rapid strep and send out pcr covid test.  Rx azithromycin antibiotic in event strep. Rapid strep negative. No send out culture available.  Will follow your pcr covid test. If positive then rx antiviral. Hopefully will get test over the weekend or latest by Monday.   Follow up as 7 days or sooner if needed.

## 2021-08-01 NOTE — Progress Notes (Signed)
   Subjective:    Patient ID: William Hendrix, male    DOB: 1941/04/07, 80 y.o.   MRN: [id number]  HPI   Stared phone call but then got pt to come in person.   In person visit. Pt does not have rapid covid test and indicates never done test before. Want to go ahead and do pcr test in event +. If results back over weekend then can start antiviral.  Location: Patient: home Provider: office   I discussed the limitations, risks, security and privacy concerns of performing an evaluation and management service by telephone and the availability of in person appointments. I also discussed with the patient that there may be a patient responsible charge related to this service. The patient expressed understanding and agreed to proceed.   History of Present Illness:   Pt states he has sore throat since yesterday. Today it is worse. No fever, no chills or sweats. Pt does not have covid test.   Pt has been vaccinated 3 times against covid.    Observations/Objective:  General-no acute distress, pleasant, oriented. Lungs- on inspection lungs appear unlabored. Neck- no tracheal deviation or jvd on inspection. Neuro- gross motor function appears intact.  Heent- posterior pharynx. Moderate large tonsils and uvula mild swollen.    Assessment and Plan:   Patient Instructions  Onset of early moderate st with tonsil hypertrophy. We did rapid strep and send out pcr covid test.  Rx azithromycin antibiotic in event strep. Rapid strep negative. No send out culture available.  Will follow your pcr covid test. If positive then rx antiviral. Hopefully will get test over the weekend or latest by Monday.   Follow up as 7 days or sooner if needed.    Mackie Pai, PA-C     Time spent with patient today was 25  minutes which consisted of chart revdiew, discussing diagnosis, work up treatment and documentation.  Follow Up Instructions:    I discussed the assessment and treatment plan with  the patient. The patient was provided an opportunity to ask questions and all were answered. The patient agreed with the plan and demonstrated an understanding of the instructions.   The patient was advised to call back or seek an in-person evaluation if the symptoms worsen or if the condition fails to improve as anticipated.     Mackie Pai, PA-C   Review of Systems  Constitutional:  Negative for chills, fatigue and fever.  HENT:  Positive for sore throat. Negative for congestion, ear discharge, ear pain and tinnitus.   Respiratory:  Negative for cough, choking and wheezing.   Cardiovascular:  Negative for chest pain and palpitations.  Gastrointestinal:  Negative for abdominal pain.  Musculoskeletal:  Negative for back pain, neck pain and neck stiffness.  Neurological:  Negative for dizziness, numbness and headaches.  Hematological:  Positive for adenopathy. Does not bruise/bleed easily.      Objective:   Physical Exam        Assessment & Plan:

## 2021-08-01 NOTE — Telephone Encounter (Signed)
Patient called requesting appointment to be seen for sore throat and congestion. Denies cough or fever.  Appt made for William Hendrix for 3pm today.  Spoke with office manager, Trixie Rude. Patient will need negative COVID test prior to appt. She will call patient with instructions about testing.

## 2021-08-02 LAB — SARS-COV-2, NAA 2 DAY TAT

## 2021-08-02 LAB — NOVEL CORONAVIRUS, NAA: SARS-CoV-2, NAA: NOT DETECTED

## 2021-08-11 ENCOUNTER — Other Ambulatory Visit: Payer: Self-pay | Admitting: Cardiology

## 2021-08-11 DIAGNOSIS — R0609 Other forms of dyspnea: Secondary | ICD-10-CM

## 2021-08-13 ENCOUNTER — Other Ambulatory Visit: Payer: Self-pay | Admitting: Cardiology

## 2021-08-14 ENCOUNTER — Other Ambulatory Visit: Payer: Self-pay | Admitting: Neurological Surgery

## 2021-08-29 ENCOUNTER — Other Ambulatory Visit: Payer: Self-pay | Admitting: Cardiology

## 2021-08-29 ENCOUNTER — Ambulatory Visit (INDEPENDENT_AMBULATORY_CARE_PROVIDER_SITE_OTHER): Payer: Medicare HMO | Admitting: Pharmacist

## 2021-08-29 DIAGNOSIS — M5416 Radiculopathy, lumbar region: Secondary | ICD-10-CM

## 2021-08-29 DIAGNOSIS — I10 Essential (primary) hypertension: Secondary | ICD-10-CM

## 2021-08-29 DIAGNOSIS — R072 Precordial pain: Secondary | ICD-10-CM

## 2021-08-29 DIAGNOSIS — Z794 Long term (current) use of insulin: Secondary | ICD-10-CM

## 2021-08-29 DIAGNOSIS — E785 Hyperlipidemia, unspecified: Secondary | ICD-10-CM

## 2021-08-29 DIAGNOSIS — E118 Type 2 diabetes mellitus with unspecified complications: Secondary | ICD-10-CM

## 2021-08-29 NOTE — Patient Instructions (Signed)
Mr. Hinzman It was a pleasure speaking with you today.  I have attached a summary of our visit today and information about your health goals.  If you have any questions or concerns, please feel free to contact me either at the phone number below or with a MyChart message.   I will touch base with you again by phone on Monday, February 20th, 2023 at 11am If you need to cancel or re-schedule our visit, please call [phone] and our care guide team will be happy to assist you.   Keep up the good work!  Cherre Robins, PharmD Clinical Pharmacist [facility] [phone] (direct line)  [phone] (main office number)  Hypertension / Heart Failure: BP Readings from Last 3 Encounters:  08/01/21 137/70  06/26/21 (!) 142/47  05/27/21 128/82   Pharmacist Clinical Goal(s): Over the next 90 days, patient will work with PharmD and providers to maintain BP goal <130/80 Current regimen:  Metoprolol Tartrate 25mg  - take 1 tablet twice daily  Amlodipine 5mg  daily  Interventions: Discussed blood pressure goal Recommended continued monitoring blood pressure 3 to 4 times per week Patient self care activities - Over the next 90 days, patient will: Check blood pressure 3 to 4 times per week, document, and provide at future appointments Ensure daily salt intake < 2300 mg/day Continue current medications for lowering blood pressure  Hyperlipidemia, mixed (elevated LDL and triglycerides) Lab Results  Component Value Date/Time   LDLCALC 58 06/26/2021 12:39 PM   [place] 64 08/28/2020 11:23 AM   LDLDIRECT 57.0 11/21/2019 08:24 AM   Pharmacist Clinical Goal(s): Over the next 90 days, patient will work with PharmD and providers to maintain LDL goal < 70, and achieve Triglyceride goal < 150 Current regimen:  Atorvastatin 40mg  daily Fenofibrate 160mg  daily Aspirin 81mg  daily Isosorbide Mononitrate 30mg  daily Nitriglycerine - place 1 tablet under tongue as  needed for chest pain, may repeat in 5 minutes if chest pain continues but if 2nd dose is needed, call 911 for assistance.  Interventions: Discussed LDL and triglyceride goal Patient self care activities - Over the next 90 days, patient will: Maintain current cholesterol and heart medication regimen.  Work at eating less saturated fats and fried foods. Limit intake of sugar and carbohydrate containing foods.   Diabetes Lab Results  Component Value Date/Time   HGBA1C 6.0 (A) 05/27/2021 03:31 PM   HGBA1C 6.7 (A) 01/14/2021 02:56 PM   HGBA1C 8.0 (H) 11/21/2019 08:24 AM   HGBA1C 7.2 (H) 08/14/2019 08:12 AM   Pharmacist Clinical Goal(s): Over the next 90 days, patient will work with PharmD and providers to maintain A1c goal <7% Current regimen:  Novolin N 40 units each morning and each evening Novolin R 10 to 30 units prior to each meal based on blood glucose and sliding scale recommended by Dr Cruzita Lederer) Interventions: Discussed home blood glucose and A1c goal Reviewed home blood glucose readings and reviewed goals  Fasting blood glucose goal (before meals) = 80 to 130 Blood glucose goal after a meal = less than 180  Reviewed how to identify and treat low blood glucose. Patient self care activities - Over the next 90 days, patient will: Check blood sugar 3 to 4 times daily, before meals; document, and provide at future appointments Contact provider with any episodes of hypoglycemia  Lumbar Radiculopathy / chronic pain / burn wounds Pharmacist Clinical Goal(s) Over the next 90 days, patient will work with PharmD and providers to reduce pain  Current regimen:  Acetaminophen 500 to 1000mg  as needed for pain up to every 8 hours Baclofen 10mg  - take 1 or 2 tablets at bedtime as needed. Patient self care activities - Over the next 90 days, patient will: Maintain pain medication regimen Follow up with Dr Ellene Route / neurosurgeon - surgery planned for 09/11/2021  Gout:  Current regimen:   Allopurinol 300mg  daily  Interventions:  Continue current mediation regimen for  gout.   Health Maintenance:  Reviewed vaccination history and discussed benefits of COVID booster and Shingrix vaccinations Patient declined COVID bivalent booster at this time.  Considering Shingrix - will wait until 2023 to check cost.  Medication management Pharmacist Clinical Goal(s): Over the next 90 days, patient will work with PharmD and providers to maintain optimal medication adherence Current pharmacy: [facility] Order Interventions Comprehensive medication review performed. Continue current medication management strategy Patient self care activities - Over the next 90 days, patient will: Focus on medication adherence by filling and taking medications appropriately  Take medications as prescribed Report any questions or concerns to PharmD and/or provider(s)    Patient verbalizes understanding of instructions provided today and agrees to view in [place].
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
## 2021-09-08 NOTE — Progress Notes (Signed)
Surgical Instructions    Your procedure is scheduled on Thursday, December 1st.  Report to [facility] Main Entrance "A" at 5:30 A.M., then check in with the Admitting office.  Call this number if you have problems the morning of surgery:  [phone]   If you have any questions prior to your surgery date call [phone]: Open Monday-Friday 8am-4pm    Remember:  Do not eat after midnight the night before your surgery  You may drink clear liquids until 4:30 AM the morning of your surgery.   Clear liquids allowed are: Water, Non-Citrus Juices (without pulp), Carbonated Beverages, Clear Tea, Black Coffee ONLY (NO MILK, CREAM OR POWDERED CREAMER of any kind), and Gatorade    Take these medicines the morning of surgery with A SIP OF WATER  Alfuzosin (Uroxatral)  Allopurinol (Zyloprim)  Atorvastatin (Lipitor)'  Metoprolol  Pantoprazole (Protonix)  If needed: Tylenol   Flonase Nasal Spray  Nitroglycerin tablet  Follow your surgeon's instructions on when to stop Aspirin.  If no instructions were given by your surgeon then you will need to call the office to get those instructions.     As of today, STOP taking any Aspirin (unless otherwise instructed by your surgeon) Aleve, Naproxen, Ibuprofen, Motrin, Advil, Goody's, BC's, all herbal medications, fish oil, and all vitamins.  WHAT DO I DO ABOUT MY DIABETES MEDICATION?   Do not take oral diabetes medicines (pills) the morning of surgery.  THE NIGHT BEFORE SURGERY, do NOT take bedtime dose of Novolin R Relion THE NIGHT BEFORE SURGERY, take 20 units (1/2 of normal dose) of Novolin N      THE MORNING OF SURGERY, take 20 units (1/2 of normal dose) of Novolin N  The day of surgery, do not take other diabetes injectables, including Byetta (exenatide), Bydureon (exenatide ER), Victoza (liraglutide), or Trulicity (dulaglutide).  If your CBG is greater than 220 mg/dL, you may take  of your sliding scale (correction) dose of  insulin.   HOW TO MANAGE YOUR DIABETES BEFORE AND AFTER SURGERY  Why is it important to control my blood sugar before and after surgery? Improving blood sugar levels before and after surgery helps healing and can limit problems. A way of improving blood sugar control is eating a healthy diet by:  Eating less sugar and carbohydrates  Increasing activity/exercise  Talking with your doctor about reaching your blood sugar goals High blood sugars (greater than 180 mg/dL) can raise your risk of infections and slow your recovery, so you will need to focus on controlling your diabetes during the weeks before surgery. Make sure that the doctor who takes care of your diabetes knows about your planned surgery including the date and location.  How do I manage my blood sugar before surgery? Check your blood sugar at least 4 times a day, starting 2 days before surgery, to make sure that the level is not too high or low.  Check your blood sugar the morning of your surgery when you wake up and every 2 hours until you get to the Short Stay unit.  If your blood sugar is less than 70 mg/dL, you will need to treat for low blood sugar: Do not take insulin. Treat a low blood sugar (less than 70 mg/dL) with  cup of clear juice (cranberry or apple), 4 glucose tablets, OR glucose gel. Recheck blood sugar in 15 minutes after treatment (to make sure it is greater than 70 mg/dL). If your blood sugar is not greater than 70 mg/dL on  recheck, call [phone] for further instructions. Report your blood sugar to the short stay nurse when you get to Short Stay.  If you are admitted to the hospital after surgery: Your blood sugar will be checked by the staff and you will probably be given insulin after surgery (instead of oral diabetes medicines) to make sure you have good blood sugar levels. The goal for blood sugar control after surgery is 80-180 mg/dL.   After your COVID test   You are not required to quarantine  however you are required to wear a well-fitting mask when you are out and around people not in your household.  If your mask becomes wet or soiled, replace with a new one.  Wash your hands often with soap and water for 20 seconds or clean your hands with an alcohol-based hand sanitizer that contains at least 60% alcohol.  Do not share personal items.  Notify your provider: if you are in close contact with someone who has COVID  or if you develop a fever of 100.4 or greater, sneezing, cough, sore throat, shortness of breath or body aches.   DAY OF SURGERY         Do not wear jewelry or makeup Do not wear lotions, powders, colognes, or deodorant. Men may shave face and neck. Do not bring valuables to the hospital.             [facility] is not responsible for any belongings or valuables.  Do NOT Smoke (Tobacco/Vaping)  24 hours prior to your procedure  If you use a CPAP at night, you may bring your mask for your overnight stay.   Contacts, glasses, hearing aids, dentures or partials may not be worn into surgery, please bring cases for these belongings   For patients admitted to the hospital, discharge time will be determined by your treatment team.   Patients discharged the day of surgery will not be allowed to drive home, and someone needs to stay with them for 24 hours.  NO VISITORS WILL BE ALLOWED IN PRE-OP WHERE PATIENTS ARE PREPPED FOR SURGERY.  ONLY 1 SUPPORT PERSON MAY BE PRESENT IN THE WAITING ROOM WHILE YOU ARE IN SURGERY.  IF YOU ARE TO BE ADMITTED, ONCE YOU ARE IN YOUR ROOM YOU WILL BE ALLOWED TWO (2) VISITORS. 1 (ONE) VISITOR MAY STAY OVERNIGHT BUT MUST ARRIVE TO THE ROOM BY 8pm.  Minor children may have two parents present. Special consideration for safety and communication needs will be reviewed on a case by case basis.  Special instructions:    Oral Hygiene is also important to reduce your risk of infection.  Remember - BRUSH YOUR TEETH THE MORNING OF SURGERY WITH YOUR  REGULAR TOOTHPASTE   [facility]- Preparing For Surgery  Before surgery, you can play an important role. Because skin is not sterile, your skin needs to be as free of germs as possible. You can reduce the number of germs on your skin by washing with CHG (chlorahexidine gluconate) Soap before surgery.  CHG is an antiseptic cleaner which kills germs and bonds with the skin to continue killing germs even after washing.     Please do not use if you have an allergy to CHG or antibacterial soaps. If your skin becomes reddened/irritated stop using the CHG.  Do not shave (including legs and underarms) for at least 48 hours prior to first CHG shower. It is OK to shave your face.  Please follow these instructions carefully.  Shower the NIGHT BEFORE SURGERY and the MORNING OF SURGERY with CHG Soap.   If you chose to wash your hair, wash your hair first as usual with your normal shampoo. After you shampoo, rinse your hair and body thoroughly to remove the shampoo.  Then [employer] and genitals (private parts) with your normal soap and rinse thoroughly to remove soap.  After that Use CHG Soap as you would any other liquid soap. You can apply CHG directly to the skin and wash gently with a scrungie or a clean washcloth.   Apply the CHG Soap to your body ONLY FROM THE NECK DOWN.  Do not use on open wounds or open sores. Avoid contact with your eyes, ears, mouth and genitals (private parts). Wash Face and genitals (private parts)  with your normal soap.   Wash thoroughly, paying special attention to the area where your surgery will be performed.  Thoroughly rinse your body with warm water from the neck down.  DO NOT shower/wash with your normal soap after using and rinsing off the CHG Soap.  Pat yourself dry with a CLEAN TOWEL.  Wear CLEAN PAJAMAS to bed the night before surgery  Place CLEAN SHEETS on your bed the night before your surgery  DO NOT SLEEP WITH PETS.   Day of Surgery:  Take a  shower with CHG soap. Wear Clean/Comfortable clothing the morning of surgery Do not apply any deodorants/lotions.   Remember to brush your teeth WITH YOUR REGULAR TOOTHPASTE.   Please read over the following fact sheets that you were given.

## 2021-09-09 ENCOUNTER — Encounter (HOSPITAL_COMMUNITY)
Admission: RE | Admit: 2021-09-09 | Discharge: 2021-09-09 | Disposition: A | Discharge status: Home / Self Care | Payer: Medicare HMO | Source: Ambulatory Visit | Attending: Neurological Surgery | Admitting: Neurological Surgery

## 2021-09-09 ENCOUNTER — Other Ambulatory Visit: Payer: Self-pay

## 2021-09-09 ENCOUNTER — Encounter (HOSPITAL_COMMUNITY): Payer: Self-pay

## 2021-09-09 VITALS — BP 117/66 | HR 66 | Temp 97.8°F | Resp 19 | Ht 70.5 in | Wt 243.0 lb

## 2021-09-09 DIAGNOSIS — G4733 Obstructive sleep apnea (adult) (pediatric): Secondary | ICD-10-CM | POA: Insufficient documentation

## 2021-09-09 DIAGNOSIS — K449 Diaphragmatic hernia without obstruction or gangrene: Secondary | ICD-10-CM | POA: Insufficient documentation

## 2021-09-09 DIAGNOSIS — I7143 Infrarenal abdominal aortic aneurysm, without rupture: Secondary | ICD-10-CM | POA: Diagnosis present

## 2021-09-09 DIAGNOSIS — I2510 Atherosclerotic heart disease of native coronary artery without angina pectoris: Secondary | ICD-10-CM | POA: Diagnosis present

## 2021-09-09 DIAGNOSIS — I130 Hypertensive heart and chronic kidney disease with heart failure and stage 1 through stage 4 chronic kidney disease, or unspecified chronic kidney disease: Secondary | ICD-10-CM | POA: Insufficient documentation

## 2021-09-09 DIAGNOSIS — I10 Essential (primary) hypertension: Secondary | ICD-10-CM | POA: Diagnosis not present

## 2021-09-09 DIAGNOSIS — Z981 Arthrodesis status: Secondary | ICD-10-CM | POA: Diagnosis not present

## 2021-09-09 DIAGNOSIS — Z9989 Dependence on other enabling machines and devices: Secondary | ICD-10-CM | POA: Insufficient documentation

## 2021-09-09 DIAGNOSIS — M4727 Other spondylosis with radiculopathy, lumbosacral region: Secondary | ICD-10-CM | POA: Diagnosis not present

## 2021-09-09 DIAGNOSIS — Z01818 Encounter for other preprocedural examination: Secondary | ICD-10-CM | POA: Insufficient documentation

## 2021-09-09 DIAGNOSIS — M1990 Unspecified osteoarthritis, unspecified site: Secondary | ICD-10-CM | POA: Insufficient documentation

## 2021-09-09 DIAGNOSIS — Z79899 Other long term (current) drug therapy: Secondary | ICD-10-CM | POA: Insufficient documentation

## 2021-09-09 DIAGNOSIS — Z87891 Personal history of nicotine dependence: Secondary | ICD-10-CM | POA: Diagnosis not present

## 2021-09-09 DIAGNOSIS — K219 Gastro-esophageal reflux disease without esophagitis: Secondary | ICD-10-CM | POA: Diagnosis present

## 2021-09-09 DIAGNOSIS — E1122 Type 2 diabetes mellitus with diabetic chronic kidney disease: Secondary | ICD-10-CM | POA: Diagnosis present

## 2021-09-09 DIAGNOSIS — Z7982 Long term (current) use of aspirin: Secondary | ICD-10-CM | POA: Diagnosis not present

## 2021-09-09 DIAGNOSIS — M4807 Spinal stenosis, lumbosacral region: Secondary | ICD-10-CM | POA: Diagnosis present

## 2021-09-09 DIAGNOSIS — I5032 Chronic diastolic (congestive) heart failure: Secondary | ICD-10-CM | POA: Diagnosis present

## 2021-09-09 DIAGNOSIS — M5416 Radiculopathy, lumbar region: Secondary | ICD-10-CM | POA: Diagnosis not present

## 2021-09-09 DIAGNOSIS — M48062 Spinal stenosis, lumbar region with neurogenic claudication: Secondary | ICD-10-CM | POA: Diagnosis present

## 2021-09-09 DIAGNOSIS — Z955 Presence of coronary angioplasty implant and graft: Secondary | ICD-10-CM | POA: Insufficient documentation

## 2021-09-09 DIAGNOSIS — I252 Old myocardial infarction: Secondary | ICD-10-CM | POA: Diagnosis not present

## 2021-09-09 DIAGNOSIS — Z20822 Contact with and (suspected) exposure to covid-19: Secondary | ICD-10-CM | POA: Diagnosis present

## 2021-09-09 DIAGNOSIS — M4327 Fusion of spine, lumbosacral region: Secondary | ICD-10-CM | POA: Diagnosis not present

## 2021-09-09 DIAGNOSIS — E785 Hyperlipidemia, unspecified: Secondary | ICD-10-CM | POA: Diagnosis present

## 2021-09-09 DIAGNOSIS — Z79890 Hormone replacement therapy (postmenopausal): Secondary | ICD-10-CM | POA: Diagnosis not present

## 2021-09-09 DIAGNOSIS — E118 Type 2 diabetes mellitus with unspecified complications: Secondary | ICD-10-CM | POA: Diagnosis not present

## 2021-09-09 DIAGNOSIS — N183 Chronic kidney disease, stage 3 unspecified: Secondary | ICD-10-CM | POA: Insufficient documentation

## 2021-09-09 DIAGNOSIS — Z794 Long term (current) use of insulin: Secondary | ICD-10-CM | POA: Diagnosis not present

## 2021-09-09 DIAGNOSIS — I5030 Unspecified diastolic (congestive) heart failure: Secondary | ICD-10-CM | POA: Insufficient documentation

## 2021-09-09 DIAGNOSIS — M109 Gout, unspecified: Secondary | ICD-10-CM | POA: Diagnosis present

## 2021-09-09 DIAGNOSIS — J42 Unspecified chronic bronchitis: Secondary | ICD-10-CM | POA: Diagnosis present

## 2021-09-09 DIAGNOSIS — M4726 Other spondylosis with radiculopathy, lumbar region: Secondary | ICD-10-CM | POA: Diagnosis present

## 2021-09-09 DIAGNOSIS — E1142 Type 2 diabetes mellitus with diabetic polyneuropathy: Secondary | ICD-10-CM | POA: Diagnosis present

## 2021-09-09 LAB — TYPE AND SCREEN
ABO/RH(D): AB POS
Antibody Screen: NEGATIVE

## 2021-09-09 LAB — BASIC METABOLIC PANEL
Anion gap: 8 (ref 5–15)
BUN: 39 mg/dL — ABNORMAL HIGH (ref 8–23)
CO2: 28 mmol/L (ref 22–32)
Calcium: 9.9 mg/dL (ref 8.9–10.3)
Chloride: 104 mmol/L (ref 98–111)
Creatinine, Ser: 1.94 mg/dL — ABNORMAL HIGH (ref 0.61–1.24)
GFR, Estimated: 34 mL/min — ABNORMAL LOW (ref >60)
Glucose, Bld: 174 mg/dL — ABNORMAL HIGH (ref 70–99)
Potassium: 3.7 mmol/L (ref 3.5–5.1)
Sodium: 140 mmol/L (ref 135–145)

## 2021-09-09 LAB — CBC
HCT: 39 % (ref 39.0–52.0)
Hemoglobin: 13.1 g/dL (ref 13.0–17.0)
MCH: 30.7 pg (ref 26.0–34.0)
MCHC: 33.6 g/dL (ref 30.0–36.0)
MCV: 91.3 fL (ref 80.0–100.0)
Platelets: 195 10*3/uL (ref 150–400)
RBC: 4.27 MIL/uL (ref 4.22–5.81)
RDW: 14.4 % (ref 11.5–15.5)
WBC: 7 10*3/uL (ref 4.0–10.5)
nRBC: 0 % (ref 0.0–0.2)

## 2021-09-09 LAB — GLUCOSE, CAPILLARY: Glucose-Capillary: 223 mg/dL — ABNORMAL HIGH (ref 70–99)

## 2021-09-09 LAB — SURGICAL PCR SCREEN
MRSA, PCR: NEGATIVE
Staphylococcus aureus: NEGATIVE

## 2021-09-09 NOTE — Progress Notes (Signed)
PCP: Penni Homans, MD Cardiologist: Kirk Ruths, MD  EKG: 09/09/21 CXR: na ECHO: 2019 Stress Test: denies Cardiac Cath: 07/30/16 and 02/26/17  Fasting Blood Sugar- 115-200 Checks Blood Sugar_3-4__ times a day  OSA/CPAP: Yes, wears cpap nightly  ASA:  Last dose 09/07/21 Blood Thinner: No  Covid test 09/09/21 at PAT  Anesthesia Review: Yes, cardiac history  Patient denies shortness of breath, fever, cough, and chest pain at PAT appointment.  Patient verbalized understanding of instructions provided today at the PAT appointment.  Patient asked to review instructions at home and day of surgery.

## 2021-09-10 DIAGNOSIS — E785 Hyperlipidemia, unspecified: Secondary | ICD-10-CM

## 2021-09-10 DIAGNOSIS — E118 Type 2 diabetes mellitus with unspecified complications: Secondary | ICD-10-CM

## 2021-09-10 DIAGNOSIS — Z794 Long term (current) use of insulin: Secondary | ICD-10-CM | POA: Diagnosis not present

## 2021-09-10 DIAGNOSIS — I10 Essential (primary) hypertension: Secondary | ICD-10-CM

## 2021-09-10 DIAGNOSIS — Z87891 Personal history of nicotine dependence: Secondary | ICD-10-CM

## 2021-09-10 LAB — SARS CORONAVIRUS 2 (TAT 6-24 HRS): SARS Coronavirus 2: NEGATIVE

## 2021-09-10 LAB — HEMOGLOBIN A1C
Hgb A1c MFr Bld: 7.1 % — ABNORMAL HIGH (ref 4.8–5.6)
Mean Plasma Glucose: 157 mg/dL

## 2021-09-10 NOTE — Anesthesia Preprocedure Evaluation (Addendum)
Anesthesia Evaluation  Patient identified by MRN, date of birth, ID band Patient awake    Reviewed: Allergy & Precautions, NPO status , Patient's Chart, lab work & pertinent test results, reviewed documented beta blocker date and time   Airway Mallampati: III  TM Distance: >3 FB Neck ROM: Full    Dental  (+) Dental Advisory Given   Pulmonary shortness of breath, sleep apnea , former smoker,    breath sounds clear to auscultation       Cardiovascular hypertension, Pt. on medications and Pt. on home beta blockers + CAD and + Cardiac Stents   Rhythm:Regular Rate:Normal     Neuro/Psych  Neuromuscular disease    GI/Hepatic Neg liver ROS, hiatal hernia, GERD  ,  Endo/Other  diabetes  Renal/GU CRFRenal disease     Musculoskeletal  (+) Arthritis ,   Abdominal   Peds  Hematology negative hematology ROS (+)   Anesthesia Other Findings   Reproductive/Obstetrics                            Lab Results  Component Value Date   WBC 7.0 09/09/2021   HGB 13.1 09/09/2021   HCT 39.0 09/09/2021   MCV 91.3 09/09/2021   PLT 195 09/09/2021   Lab Results  Component Value Date   CREATININE 1.94 (H) 09/09/2021   BUN 39 (H) 09/09/2021   NA 140 09/09/2021   K 3.7 09/09/2021   CL 104 09/09/2021   CO2 28 09/09/2021    Anesthesia Physical Anesthesia Plan  ASA: 3  Anesthesia Plan: General   Post-op Pain Management: Ofirmev IV (intra-op)   Induction:   PONV Risk Score and Plan: 2 and Dexamethasone, Ondansetron and Treatment may vary due to age or medical condition  Airway Management Planned: Oral ETT  Additional Equipment:   Intra-op Plan:   Post-operative Plan: Extubation in OR  Informed Consent: I have reviewed the patients History and Physical, chart, labs and discussed the procedure including the risks, benefits and alternatives for the proposed anesthesia with the patient or authorized  representative who has indicated his/her understanding and acceptance.     Dental advisory given  Plan Discussed with: CRNA  Anesthesia Plan Comments: (PAT note by Karoline Caldwell, PA-C: Follows with cardiology for history of HTN, HLD, HFpEF, CAD s/p multiple PCI, most recently October 2017 NSTEMI treated with DES to circumflex and RCA.  He had prior PCI of his LAD.  Repeat cath May 2018 showed continued patency of stents.  Echo December 2019 showed normal LV function and mild LAE.  Nuclear study December 2019 showed no ischemia or infarction.  Cardiac clearance per telephone encounter 04/16/2021 (surgery was subsequently delayed as patient's wife needed to undergo surgery), "Chart reviewed as part of pre-operative protocol coverage. Patient was contacted7/6/2022in reference to pre-operative risk assessment for pending surgery as outlined below. Zyler Hyson Wilbornewas last seen on 02/2021 by Dr. Stanford Breed with history of CAD s/p prior MI/PCIs (last intervention 2694), diastolic CHF, AAA, arthritis, CKD III, DM, HTN, HLD amongst other diagnoses outlined. Stress test 2019 was normal. Doing well at last OV. Amlodipine was added for blood pressure.I reached out to patient for update on howheis doing. The patient affirmshehas been doing well without any new cardiac symptoms. He reports his blood pressure has improved (I.e. example reading 129/63.)Therefore, based on ACC/AHA guidelines, the patient would be at acceptable risk for the planned procedure without further cardiovascular testing.The patient was advised that ifhedevelops new  symptoms prior to surgery to contact our office to arrange for a follow-up visit, and heverbalized understanding."  OSA on CPAP.  History of C3-5 ACDF.  History of small hiatal hernia noted remotely on CT scan.  Labs reviewed, creatinine elevated at 1.94 c/w hx of CKD baseline over past year ~1.5-1.6.  DM2 controlled, A1c 7.1.  Remainder of labs unremarkable  Nuclear  stress 09/16/2018: . There was no ST segment deviation noted during stress. . The study is normal. . This is a low risk study. . The left ventricular ejection fraction is normal (55-65%).  Norma pharmacologic nuclear stress test with no evidence for a prior infarct or ischemia.   Echo 09/16/2018: - Left ventricle: The cavity size was normal. Wall thickness was  increased in a pattern of moderate LVH. Systolic function was  normal. The estimated ejection fraction was in the range of 55%  to 60%. Doppler parameters are consistent with both elevated  ventricular end-diastolic filling pressure and elevated left  atrial filling pressure.  - Aortic valve: Cannot tell if valve is tri leaflet or not.  Sclerosis without stenosis. There was trivial regurgitation.  - Left atrium: The atrium was mildly dilated.  - Atrial septum: No defect or patent foramen ovale was identified.  - Pericardium, extracardiac: A trivial pericardial effusion was  identified.   Cath 02/26/2017: 1. Three-vessel coronary artery disease with continued patency of the stented segments in the right coronary artery, left circumflex, and LAD 2. Moderately elevated LVEDP  Recommendations: The patient's angiogram as compared to his prior cath films from last year when he underwent 2 vessel PCI of the right coronary artery and left circumflex. His stents are widely patent and there is no change in coronary anatomy. I do not appreciate any obstructive disease. He should continue with medical therapy and work on lifestyle modification.  )       Anesthesia Quick Evaluation

## 2021-09-10 NOTE — Progress Notes (Signed)
Anesthesia Chart Review:  Follows with cardiology for history of HTN, HLD, HFpEF, CAD s/p multiple PCI, most recently October 2017 NSTEMI treated with DES to circumflex and RCA.  He had prior PCI of his LAD.  Repeat cath May 2018 showed continued patency of stents.  Echo December 2019 showed normal LV function and mild LAE.  Nuclear study December 2019 showed no ischemia or infarction.  Cardiac clearance per telephone encounter 04/16/2021 (surgery was subsequently delayed as patient's wife needed to undergo surgery), "Chart reviewed as part of pre-operative protocol coverage. Patient was contacted 04/16/2021 in reference to pre-operative risk assessment for pending surgery as outlined below.  William Hendrix was last seen on 02/2021 by Dr. Stanford Breed with history of CAD s/p prior MI/PCIs (last intervention 3568), diastolic CHF, AAA, arthritis, CKD III, DM, HTN, HLD amongst other diagnoses outlined. Stress test 2019 was normal. Doing well at last OV. Amlodipine was added for blood pressure. I reached out to patient for update on how he is doing. The patient affirms he has been doing well without any new cardiac symptoms. He reports his blood pressure has improved (I.e. example reading 129/63.) Therefore, based on ACC/AHA guidelines, the patient would be at acceptable risk for the planned procedure without further cardiovascular testing. The patient was advised that if he develops new symptoms prior to surgery to contact our office to arrange for a follow-up visit, and he verbalized understanding."  OSA on CPAP.  History of C3-5 ACDF.  History of small hiatal hernia noted remotely on CT scan.  Labs reviewed, creatinine elevated at 1.94 c/w hx of CKD baseline over past year ~1.5-1.6.  DM2 controlled, A1c 7.1.  Remainder of labs unremarkable  Nuclear stress 09/16/2018: There was no ST segment deviation noted during stress. The study is normal. This is a low risk study. The left ventricular ejection fraction is  normal (55-65%).   Norma pharmacologic nuclear stress test with no evidence for a prior infarct or ischemia.   Echo 09/16/2018: - Left ventricle: The cavity size was normal. Wall thickness was    increased in a pattern of moderate LVH. Systolic function was    normal. The estimated ejection fraction was in the range of 55%    to 60%. Doppler parameters are consistent with both elevated    ventricular end-diastolic filling pressure and elevated left    atrial filling pressure.  - Aortic valve: Cannot tell if valve is tri leaflet or not.    Sclerosis without stenosis. There was trivial regurgitation.  - Left atrium: The atrium was mildly dilated.  - Atrial septum: No defect or patent foramen ovale was identified.  - Pericardium, extracardiac: A trivial pericardial effusion was    identified.   Cath 02/26/2017: 1. Three-vessel coronary artery disease with continued patency of the stented segments in the right coronary artery, left circumflex, and LAD 2. Moderately elevated LVEDP   Recommendations: The patient's angiogram as compared to his prior cath films from last year when he underwent 2 vessel PCI of the right coronary artery and left circumflex. His stents are widely patent and there is no change in coronary anatomy. I do not appreciate any obstructive disease. He should continue with medical therapy and work on lifestyle modification.    [place], [facility] [facility] Short Stay Center/Anesthesiology Phone [phone] 09/10/2021 10:22 AM

## 2021-09-11 ENCOUNTER — Encounter (HOSPITAL_COMMUNITY): Payer: Self-pay | Admitting: Neurological Surgery

## 2021-09-11 ENCOUNTER — Inpatient Hospital Stay (HOSPITAL_COMMUNITY): Payer: Medicare HMO | Admitting: Physician Assistant

## 2021-09-11 ENCOUNTER — Inpatient Hospital Stay (HOSPITAL_COMMUNITY): Payer: Medicare HMO

## 2021-09-11 ENCOUNTER — Encounter (HOSPITAL_COMMUNITY)
Admission: RE | Disposition: A | Discharge status: Home / Self Care | Payer: Self-pay | Source: Ambulatory Visit | Attending: Neurological Surgery

## 2021-09-11 ENCOUNTER — Inpatient Hospital Stay (HOSPITAL_COMMUNITY)
Admission: RE | Admit: 2021-09-11 | Discharge: 2021-09-12 | DRG: 454 | Disposition: A | Discharge status: Home Health | Payer: Medicare HMO | Source: Ambulatory Visit | Attending: Neurological Surgery | Admitting: Neurological Surgery

## 2021-09-11 ENCOUNTER — Inpatient Hospital Stay (HOSPITAL_COMMUNITY): Payer: Medicare HMO | Admitting: Anesthesiology

## 2021-09-11 ENCOUNTER — Other Ambulatory Visit: Payer: Self-pay

## 2021-09-11 DIAGNOSIS — M48062 Spinal stenosis, lumbar region with neurogenic claudication: Secondary | ICD-10-CM | POA: Diagnosis present

## 2021-09-11 DIAGNOSIS — I130 Hypertensive heart and chronic kidney disease with heart failure and stage 1 through stage 4 chronic kidney disease, or unspecified chronic kidney disease: Secondary | ICD-10-CM | POA: Diagnosis present

## 2021-09-11 DIAGNOSIS — M1990 Unspecified osteoarthritis, unspecified site: Secondary | ICD-10-CM | POA: Diagnosis present

## 2021-09-11 DIAGNOSIS — M5416 Radiculopathy, lumbar region: Secondary | ICD-10-CM | POA: Diagnosis not present

## 2021-09-11 DIAGNOSIS — K219 Gastro-esophageal reflux disease without esophagitis: Secondary | ICD-10-CM | POA: Diagnosis present

## 2021-09-11 DIAGNOSIS — Z85820 Personal history of malignant melanoma of skin: Secondary | ICD-10-CM

## 2021-09-11 DIAGNOSIS — M4327 Fusion of spine, lumbosacral region: Secondary | ICD-10-CM | POA: Diagnosis not present

## 2021-09-11 DIAGNOSIS — G4733 Obstructive sleep apnea (adult) (pediatric): Secondary | ICD-10-CM | POA: Diagnosis present

## 2021-09-11 DIAGNOSIS — I7143 Infrarenal abdominal aortic aneurysm, without rupture: Secondary | ICD-10-CM | POA: Diagnosis present

## 2021-09-11 DIAGNOSIS — Z79899 Other long term (current) drug therapy: Secondary | ICD-10-CM

## 2021-09-11 DIAGNOSIS — Z955 Presence of coronary angioplasty implant and graft: Secondary | ICD-10-CM

## 2021-09-11 DIAGNOSIS — Z981 Arthrodesis status: Secondary | ICD-10-CM

## 2021-09-11 DIAGNOSIS — Z8701 Personal history of pneumonia (recurrent): Secondary | ICD-10-CM

## 2021-09-11 DIAGNOSIS — Z7982 Long term (current) use of aspirin: Secondary | ICD-10-CM | POA: Diagnosis not present

## 2021-09-11 DIAGNOSIS — Z794 Long term (current) use of insulin: Secondary | ICD-10-CM

## 2021-09-11 DIAGNOSIS — Z885 Allergy status to narcotic agent status: Secondary | ICD-10-CM

## 2021-09-11 DIAGNOSIS — E785 Hyperlipidemia, unspecified: Secondary | ICD-10-CM | POA: Diagnosis present

## 2021-09-11 DIAGNOSIS — M4726 Other spondylosis with radiculopathy, lumbar region: Secondary | ICD-10-CM | POA: Diagnosis present

## 2021-09-11 DIAGNOSIS — Z91030 Bee allergy status: Secondary | ICD-10-CM

## 2021-09-11 DIAGNOSIS — K449 Diaphragmatic hernia without obstruction or gangrene: Secondary | ICD-10-CM | POA: Diagnosis not present

## 2021-09-11 DIAGNOSIS — I2510 Atherosclerotic heart disease of native coronary artery without angina pectoris: Secondary | ICD-10-CM | POA: Diagnosis not present

## 2021-09-11 DIAGNOSIS — E1142 Type 2 diabetes mellitus with diabetic polyneuropathy: Secondary | ICD-10-CM | POA: Diagnosis not present

## 2021-09-11 DIAGNOSIS — I252 Old myocardial infarction: Secondary | ICD-10-CM | POA: Diagnosis not present

## 2021-09-11 DIAGNOSIS — E1122 Type 2 diabetes mellitus with diabetic chronic kidney disease: Secondary | ICD-10-CM | POA: Diagnosis not present

## 2021-09-11 DIAGNOSIS — Z419 Encounter for procedure for purposes other than remedying health state, unspecified: Secondary | ICD-10-CM

## 2021-09-11 DIAGNOSIS — Z20822 Contact with and (suspected) exposure to covid-19: Secondary | ICD-10-CM | POA: Diagnosis not present

## 2021-09-11 DIAGNOSIS — N183 Chronic kidney disease, stage 3 unspecified: Secondary | ICD-10-CM | POA: Diagnosis not present

## 2021-09-11 DIAGNOSIS — M109 Gout, unspecified: Secondary | ICD-10-CM | POA: Diagnosis present

## 2021-09-11 DIAGNOSIS — M4807 Spinal stenosis, lumbosacral region: Secondary | ICD-10-CM | POA: Diagnosis not present

## 2021-09-11 DIAGNOSIS — Z87891 Personal history of nicotine dependence: Secondary | ICD-10-CM

## 2021-09-11 DIAGNOSIS — I5032 Chronic diastolic (congestive) heart failure: Secondary | ICD-10-CM | POA: Diagnosis not present

## 2021-09-11 DIAGNOSIS — Z87442 Personal history of urinary calculi: Secondary | ICD-10-CM

## 2021-09-11 DIAGNOSIS — J42 Unspecified chronic bronchitis: Secondary | ICD-10-CM | POA: Diagnosis not present

## 2021-09-11 DIAGNOSIS — Z8249 Family history of ischemic heart disease and other diseases of the circulatory system: Secondary | ICD-10-CM

## 2021-09-11 DIAGNOSIS — Z888 Allergy status to other drugs, medicaments and biological substances status: Secondary | ICD-10-CM

## 2021-09-11 DIAGNOSIS — Z79890 Hormone replacement therapy (postmenopausal): Secondary | ICD-10-CM | POA: Diagnosis not present

## 2021-09-11 LAB — GLUCOSE, CAPILLARY
Glucose-Capillary: 132 mg/dL — ABNORMAL HIGH (ref 70–99)
Glucose-Capillary: 175 mg/dL — ABNORMAL HIGH (ref 70–99)
Glucose-Capillary: 261 mg/dL — ABNORMAL HIGH (ref 70–99)
Glucose-Capillary: 223 mg/dL — ABNORMAL HIGH (ref 70–99)

## 2021-09-11 SURGERY — POSTERIOR LUMBAR FUSION 1 LEVEL
Anesthesia: General | Site: Spine Lumbar

## 2021-09-11 MED ORDER — PHENYLEPHRINE 40 MCG/ML (10ML) SYRINGE FOR IV PUSH (FOR BLOOD PRESSURE SUPPORT)
PREFILLED_SYRINGE | INTRAVENOUS | Status: DC | PRN
  Administered 2021-09-11: 120 ug via INTRAVENOUS

## 2021-09-11 MED ORDER — THROMBIN 20000 UNITS EX SOLR
CUTANEOUS | Status: AC
  Filled 2021-09-11: qty 20000

## 2021-09-11 MED ORDER — ROCURONIUM BROMIDE 10 MG/ML (PF) SYRINGE
PREFILLED_SYRINGE | INTRAVENOUS | Status: AC
  Filled 2021-09-11: qty 10

## 2021-09-11 MED ORDER — CEFAZOLIN SODIUM-DEXTROSE 2-4 GM/100ML-% IV SOLN
2.0000 g | Freq: Three times a day (TID) | INTRAVENOUS | Status: AC
  Administered 2021-09-11 (×2): 2 g via INTRAVENOUS
  Filled 2021-09-11 (×2): qty 100

## 2021-09-11 MED ORDER — SUGAMMADEX SODIUM 200 MG/2ML IV SOLN
INTRAVENOUS | Status: DC | PRN
  Administered 2021-09-11: 400 mg via INTRAVENOUS

## 2021-09-11 MED ORDER — INSULIN ASPART 100 UNIT/ML IJ SOLN
0.0000 [IU] | Freq: Every day | INTRAMUSCULAR | Status: DC
  Administered 2021-09-11: 2 [IU] via SUBCUTANEOUS

## 2021-09-11 MED ORDER — ONDANSETRON HCL 4 MG/2ML IJ SOLN
INTRAMUSCULAR | Status: AC
  Filled 2021-09-11: qty 2

## 2021-09-11 MED ORDER — PROBIOTIC 250 MG PO CAPS: ORAL_CAPSULE | Freq: Every day | ORAL | Status: DC

## 2021-09-11 MED ORDER — MENTHOL 3 MG MT LOZG: 1.0000 | LOZENGE | OROMUCOSAL | Status: DC | PRN

## 2021-09-11 MED ORDER — PROPOFOL 10 MG/ML IV BOLUS
INTRAVENOUS | Status: DC | PRN
  Administered 2021-09-11: 150 mg via INTRAVENOUS

## 2021-09-11 MED ORDER — INSULIN NPH (HUMAN) (ISOPHANE) 100 UNIT/ML ~~LOC~~ SUSP
50.0000 [IU] | Freq: Two times a day (BID) | SUBCUTANEOUS | Status: DC
  Administered 2021-09-11 – 2021-09-12 (×2): 50 [IU] via SUBCUTANEOUS
  Filled 2021-09-11: qty 10

## 2021-09-11 MED ORDER — THROMBIN 5000 UNITS EX SOLR: OROMUCOSAL | Status: DC | PRN

## 2021-09-11 MED ORDER — LACTATED RINGERS IV SOLN: INTRAVENOUS | Status: DC

## 2021-09-11 MED ORDER — BACLOFEN 10 MG PO TABS
10.0000 mg | ORAL_TABLET | Freq: Every evening | ORAL | Status: DC | PRN
Start: 2021-09-11 — End: 2021-09-12
  Administered 2021-09-11: 10 mg via ORAL
  Filled 2021-09-11: qty 1

## 2021-09-11 MED ORDER — THROMBIN 20000 UNITS EX SOLR: CUTANEOUS | Status: DC | PRN

## 2021-09-11 MED ORDER — INSULIN ASPART 100 UNIT/ML IJ SOLN
0.0000 [IU] | Freq: Three times a day (TID) | INTRAMUSCULAR | Status: DC
  Administered 2021-09-11: 11 [IU] via SUBCUTANEOUS

## 2021-09-11 MED ORDER — ISOSORBIDE MONONITRATE ER 30 MG PO TB24
30.0000 mg | ORAL_TABLET | Freq: Every day | ORAL | Status: DC
  Administered 2021-09-11 – 2021-09-12 (×2): 30 mg via ORAL
  Filled 2021-09-11 (×2): qty 1

## 2021-09-11 MED ORDER — FUROSEMIDE 40 MG PO TABS
80.0000 mg | ORAL_TABLET | Freq: Every day | ORAL | Status: DC
  Administered 2021-09-12: 80 mg via ORAL
  Filled 2021-09-11: qty 2

## 2021-09-11 MED ORDER — FENOFIBRATE 160 MG PO TABS
160.0000 mg | ORAL_TABLET | Freq: Every day | ORAL | Status: DC
  Administered 2021-09-11: 160 mg via ORAL
  Filled 2021-09-11: qty 1

## 2021-09-11 MED ORDER — CHLORHEXIDINE GLUCONATE CLOTH 2 % EX PADS: 6.0000 | MEDICATED_PAD | Freq: Once | CUTANEOUS | Status: DC

## 2021-09-11 MED ORDER — ACETAMINOPHEN 325 MG PO TABS: 650.0000 mg | ORAL_TABLET | ORAL | Status: DC | PRN

## 2021-09-11 MED ORDER — AMISULPRIDE (ANTIEMETIC) 5 MG/2ML IV SOLN: 10.0000 mg | Freq: Once | INTRAVENOUS | Status: DC | PRN

## 2021-09-11 MED ORDER — METHOCARBAMOL 500 MG PO TABS
500.0000 mg | ORAL_TABLET | Freq: Four times a day (QID) | ORAL | Status: DC | PRN
  Administered 2021-09-11 – 2021-09-12 (×2): 500 mg via ORAL
  Filled 2021-09-11 (×2): qty 1

## 2021-09-11 MED ORDER — SODIUM CHLORIDE 0.9 % IV SOLN: 250.0000 mL | INTRAVENOUS | Status: DC

## 2021-09-11 MED ORDER — BUPIVACAINE HCL (PF) 0.5 % IJ SOLN
INTRAMUSCULAR | Status: AC
  Filled 2021-09-11: qty 30

## 2021-09-11 MED ORDER — BISACODYL 10 MG RE SUPP: 10.0000 mg | Freq: Every day | RECTAL | Status: DC | PRN

## 2021-09-11 MED ORDER — FENTANYL CITRATE (PF) 250 MCG/5ML IJ SOLN
INTRAMUSCULAR | Status: DC | PRN
  Administered 2021-09-11: 50 ug via INTRAVENOUS
  Administered 2021-09-11: 25 ug via INTRAVENOUS
  Administered 2021-09-11: 50 ug via INTRAVENOUS
  Administered 2021-09-11: 25 ug via INTRAVENOUS
  Administered 2021-09-11: 100 ug via INTRAVENOUS

## 2021-09-11 MED ORDER — CEFAZOLIN SODIUM-DEXTROSE 2-4 GM/100ML-% IV SOLN
2.0000 g | INTRAVENOUS | Status: AC
  Administered 2021-09-11: 2 g via INTRAVENOUS
  Filled 2021-09-11: qty 100

## 2021-09-11 MED ORDER — ORAL CARE MOUTH RINSE: 15.0000 mL | Freq: Once | OROMUCOSAL | Status: AC

## 2021-09-11 MED ORDER — ACETAMINOPHEN 10 MG/ML IV SOLN
INTRAVENOUS | Status: AC
  Filled 2021-09-11: qty 100

## 2021-09-11 MED ORDER — AMISULPRIDE (ANTIEMETIC) 5 MG/2ML IV SOLN
INTRAVENOUS | Status: AC
  Filled 2021-09-11: qty 4

## 2021-09-11 MED ORDER — ONDANSETRON HCL 4 MG/2ML IJ SOLN
4.0000 mg | Freq: Four times a day (QID) | INTRAMUSCULAR | Status: DC | PRN
  Administered 2021-09-12: 4 mg via INTRAVENOUS
  Filled 2021-09-11: qty 2

## 2021-09-11 MED ORDER — METOPROLOL TARTRATE 25 MG PO TABS: 25.0000 mg | ORAL_TABLET | Freq: Two times a day (BID) | ORAL | Status: DC

## 2021-09-11 MED ORDER — RISAQUAD PO CAPS
1.0000 | ORAL_CAPSULE | Freq: Every day | ORAL | Status: DC
  Administered 2021-09-11: 1 via ORAL
  Filled 2021-09-11 (×2): qty 1

## 2021-09-11 MED ORDER — ALFUZOSIN HCL ER 10 MG PO TB24
10.0000 mg | ORAL_TABLET | Freq: Every day | ORAL | Status: DC
  Administered 2021-09-12: 10 mg via ORAL
  Filled 2021-09-11: qty 1

## 2021-09-11 MED ORDER — DOCUSATE SODIUM 100 MG PO CAPS
100.0000 mg | ORAL_CAPSULE | Freq: Two times a day (BID) | ORAL | Status: DC
  Administered 2021-09-11 – 2021-09-12 (×2): 100 mg via ORAL
  Filled 2021-09-11 (×2): qty 1

## 2021-09-11 MED ORDER — INSULIN ASPART 100 UNIT/ML IJ SOLN
0.0000 [IU] | Freq: Three times a day (TID) | INTRAMUSCULAR | Status: DC
  Administered 2021-09-12: 4 [IU] via SUBCUTANEOUS

## 2021-09-11 MED ORDER — AMLODIPINE BESYLATE 5 MG PO TABS
5.0000 mg | ORAL_TABLET | Freq: Every day | ORAL | Status: DC
  Administered 2021-09-11 – 2021-09-12 (×2): 5 mg via ORAL
  Filled 2021-09-11 (×2): qty 1

## 2021-09-11 MED ORDER — ACETAMINOPHEN 650 MG RE SUPP: 650.0000 mg | RECTAL | Status: DC | PRN

## 2021-09-11 MED ORDER — ALLOPURINOL 300 MG PO TABS
300.0000 mg | ORAL_TABLET | Freq: Every day | ORAL | Status: DC
  Administered 2021-09-12: 300 mg via ORAL
  Filled 2021-09-11: qty 1

## 2021-09-11 MED ORDER — METHOCARBAMOL 1000 MG/10ML IJ SOLN
500.0000 mg | Freq: Four times a day (QID) | INTRAVENOUS | Status: DC | PRN
  Filled 2021-09-11: qty 5

## 2021-09-11 MED ORDER — SODIUM CHLORIDE 0.9% FLUSH
3.0000 mL | Freq: Two times a day (BID) | INTRAVENOUS | Status: DC
  Administered 2021-09-11 (×2): 3 mL via INTRAVENOUS

## 2021-09-11 MED ORDER — ONDANSETRON HCL 4 MG/2ML IJ SOLN
INTRAMUSCULAR | Status: DC | PRN
  Administered 2021-09-11: 4 mg via INTRAVENOUS

## 2021-09-11 MED ORDER — CHLORHEXIDINE GLUCONATE 0.12 % MT SOLN
15.0000 mL | Freq: Once | OROMUCOSAL | Status: AC
  Administered 2021-09-11: 15 mL via OROMUCOSAL
  Filled 2021-09-11: qty 15

## 2021-09-11 MED ORDER — SENNA 8.6 MG PO TABS
1.0000 | ORAL_TABLET | Freq: Two times a day (BID) | ORAL | Status: DC
  Administered 2021-09-11 – 2021-09-12 (×2): 8.6 mg via ORAL
  Filled 2021-09-11 (×2): qty 1

## 2021-09-11 MED ORDER — SODIUM CHLORIDE 0.9% FLUSH: 3.0000 mL | INTRAVENOUS | Status: DC | PRN

## 2021-09-11 MED ORDER — TRAMADOL HCL 50 MG PO TABS: 50.0000 mg | ORAL_TABLET | Freq: Two times a day (BID) | ORAL | Status: DC | PRN

## 2021-09-11 MED ORDER — MAGNESIUM 200 MG PO TABS: ORAL_TABLET | Freq: Every day | ORAL | Status: DC

## 2021-09-11 MED ORDER — PHENOL 1.4 % MT LIQD: 1.0000 | OROMUCOSAL | Status: DC | PRN

## 2021-09-11 MED ORDER — ONDANSETRON HCL 4 MG PO TABS: 4.0000 mg | ORAL_TABLET | Freq: Four times a day (QID) | ORAL | Status: DC | PRN

## 2021-09-11 MED ORDER — LIDOCAINE-EPINEPHRINE 1 %-1:100000 IJ SOLN
INTRAMUSCULAR | Status: AC
  Filled 2021-09-11: qty 1

## 2021-09-11 MED ORDER — PROPOFOL 10 MG/ML IV BOLUS
INTRAVENOUS | Status: AC
  Filled 2021-09-11: qty 20

## 2021-09-11 MED ORDER — POLYETHYLENE GLYCOL 3350 17 G PO PACK: 17.0000 g | PACK | Freq: Every day | ORAL | Status: DC | PRN

## 2021-09-11 MED ORDER — ALUM & MAG HYDROXIDE-SIMETH 200-200-20 MG/5ML PO SUSP: 30.0000 mL | Freq: Four times a day (QID) | ORAL | Status: DC | PRN

## 2021-09-11 MED ORDER — HYDROCODONE-ACETAMINOPHEN 5-325 MG PO TABS
0.5000 | ORAL_TABLET | Freq: Two times a day (BID) | ORAL | Status: DC | PRN
  Filled 2021-09-11: qty 1

## 2021-09-11 MED ORDER — MELATONIN 5 MG PO TABS
10.0000 mg | ORAL_TABLET | Freq: Every day | ORAL | Status: DC
  Administered 2021-09-11: 10 mg via ORAL
  Filled 2021-09-11: qty 2

## 2021-09-11 MED ORDER — FLEET ENEMA 7-19 GM/118ML RE ENEM: 1.0000 | ENEMA | Freq: Once | RECTAL | Status: DC | PRN

## 2021-09-11 MED ORDER — THROMBIN 5000 UNITS EX SOLR
CUTANEOUS | Status: AC
  Filled 2021-09-11: qty 5000

## 2021-09-11 MED ORDER — HYDROMORPHONE HCL 1 MG/ML IJ SOLN
INTRAMUSCULAR | Status: AC
  Filled 2021-09-11: qty 2

## 2021-09-11 MED ORDER — ACETAMINOPHEN 10 MG/ML IV SOLN
INTRAVENOUS | Status: DC | PRN
  Administered 2021-09-11: 1000 mg via INTRAVENOUS

## 2021-09-11 MED ORDER — LIDOCAINE-EPINEPHRINE 1 %-1:100000 IJ SOLN
INTRAMUSCULAR | Status: DC | PRN
  Administered 2021-09-11: 5 mL

## 2021-09-11 MED ORDER — BUPIVACAINE HCL (PF) 0.5 % IJ SOLN
INTRAMUSCULAR | Status: DC | PRN
  Administered 2021-09-11: 30 mL

## 2021-09-11 MED ORDER — PHENYLEPHRINE 40 MCG/ML (10ML) SYRINGE FOR IV PUSH (FOR BLOOD PRESSURE SUPPORT)
PREFILLED_SYRINGE | INTRAVENOUS | Status: AC
  Filled 2021-09-11: qty 10

## 2021-09-11 MED ORDER — PANTOPRAZOLE SODIUM 20 MG PO TBEC
20.0000 mg | DELAYED_RELEASE_TABLET | Freq: Every day | ORAL | Status: DC
  Administered 2021-09-12: 20 mg via ORAL
  Filled 2021-09-11: qty 1

## 2021-09-11 MED ORDER — PHENYLEPHRINE HCL-NACL 20-0.9 MG/250ML-% IV SOLN
INTRAVENOUS | Status: DC | PRN
  Administered 2021-09-11: 20 ug/min via INTRAVENOUS

## 2021-09-11 MED ORDER — 0.9 % SODIUM CHLORIDE (POUR BTL) OPTIME
TOPICAL | Status: DC | PRN
  Administered 2021-09-11: 1000 mL

## 2021-09-11 MED ORDER — METOPROLOL TARTRATE 25 MG PO TABS
25.0000 mg | ORAL_TABLET | Freq: Two times a day (BID) | ORAL | Status: DC
  Administered 2021-09-11 – 2021-09-12 (×2): 25 mg via ORAL
  Filled 2021-09-11 (×2): qty 1

## 2021-09-11 MED ORDER — NITROGLYCERIN 0.4 MG SL SUBL: 0.4000 mg | SUBLINGUAL_TABLET | SUBLINGUAL | Status: DC | PRN

## 2021-09-11 MED ORDER — FENTANYL CITRATE (PF) 250 MCG/5ML IJ SOLN
INTRAMUSCULAR | Status: AC
  Filled 2021-09-11: qty 5

## 2021-09-11 MED ORDER — HYDROMORPHONE HCL 1 MG/ML IJ SOLN
0.2500 mg | INTRAMUSCULAR | Status: DC | PRN
Start: 2021-09-11 — End: 2021-09-11
  Administered 2021-09-11 (×4): 0.5 mg via INTRAVENOUS

## 2021-09-11 MED ORDER — ROCURONIUM BROMIDE 10 MG/ML (PF) SYRINGE
PREFILLED_SYRINGE | INTRAVENOUS | Status: DC | PRN
  Administered 2021-09-11 (×3): 20 mg via INTRAVENOUS
  Administered 2021-09-11: 30 mg via INTRAVENOUS
  Administered 2021-09-11: 50 mg via INTRAVENOUS

## 2021-09-11 MED ORDER — OXYCODONE-ACETAMINOPHEN 5-325 MG PO TABS
1.0000 | ORAL_TABLET | ORAL | Status: DC | PRN
Start: 2021-09-11 — End: 2021-09-12
  Administered 2021-09-11 – 2021-09-12 (×3): 2 via ORAL
  Filled 2021-09-11 (×3): qty 2

## 2021-09-11 MED ORDER — ATORVASTATIN CALCIUM 40 MG PO TABS
40.0000 mg | ORAL_TABLET | Freq: Every day | ORAL | Status: DC
  Administered 2021-09-12: 40 mg via ORAL
  Filled 2021-09-11: qty 1

## 2021-09-11 MED ORDER — MORPHINE SULFATE (PF) 2 MG/ML IV SOLN
2.0000 mg | INTRAVENOUS | Status: DC | PRN
  Administered 2021-09-11: 2 mg via INTRAVENOUS
  Administered 2021-09-12: 4 mg via INTRAVENOUS
  Filled 2021-09-11: qty 2
  Filled 2021-09-11: qty 1

## 2021-09-11 MED ORDER — LIDOCAINE 2% (20 MG/ML) 5 ML SYRINGE
INTRAMUSCULAR | Status: DC | PRN
  Administered 2021-09-11: 60 mg via INTRAVENOUS

## 2021-09-11 SURGICAL SUPPLY — 68 items
ADH SKN CLS APL DERMABOND .7 (GAUZE/BANDAGES/DRESSINGS) ×1
APL SRG 60D 8 XTD TIP BNDBL (TIP)
BAG COUNTER SPONGE SURGICOUNT (BAG) ×2 IMPLANT
BAG SPNG CNTER NS LX DISP (BAG) ×1
BASKET BONE COLLECTION (BASKET) ×2 IMPLANT
BLADE CLIPPER SURG (BLADE) IMPLANT
BONE CANC CHIPS 20CC PCAN1/4 (Bone Implant) ×2 IMPLANT
BUR MATCHSTICK NEURO 3.0 LAGG (BURR) ×2 IMPLANT
CAGE PLIF 8X9X23-12 LUMBAR (Cage) ×4 IMPLANT
CANISTER SUCT 3000ML PPV (MISCELLANEOUS) ×2 IMPLANT
CHIPS CANC BONE 20CC PCAN1/4 (Bone Implant) ×1 IMPLANT
CNTNR URN SCR LID CUP LEK RST (MISCELLANEOUS) ×1 IMPLANT
CONT SPEC 4OZ STRL OR WHT (MISCELLANEOUS) ×2
COVER BACK TABLE 60X90IN (DRAPES) ×2 IMPLANT
DECANTER SPIKE VIAL GLASS SM (MISCELLANEOUS) IMPLANT
DERMABOND ADVANCED (GAUZE/BANDAGES/DRESSINGS) ×1
DERMABOND ADVANCED .7 DNX12 (GAUZE/BANDAGES/DRESSINGS) ×1 IMPLANT
DEVICE DISSECT PLASMABLAD 3.0S (MISCELLANEOUS) ×1 IMPLANT
DRAPE C-ARM 42X72 X-RAY (DRAPES) ×4 IMPLANT
DRAPE HALF SHEET 40X57 (DRAPES) IMPLANT
DRAPE LAPAROTOMY 100X72X124 (DRAPES) ×2 IMPLANT
DRSG OPSITE POSTOP 4X8 (GAUZE/BANDAGES/DRESSINGS) ×2 IMPLANT
DURAPREP 26ML APPLICATOR (WOUND CARE) ×2 IMPLANT
DURASEAL APPLICATOR TIP (TIP) IMPLANT
DURASEAL SPINE SEALANT 3ML (MISCELLANEOUS) IMPLANT
ELECT REM PT RETURN 9FT ADLT (ELECTROSURGICAL) ×2
ELECTRODE REM PT RTRN 9FT ADLT (ELECTROSURGICAL) ×1 IMPLANT
GAUZE 4X4 16PLY ~~LOC~~+RFID DBL (SPONGE) IMPLANT
GAUZE SPONGE 4X4 12PLY STRL (GAUZE/BANDAGES/DRESSINGS) ×2 IMPLANT
GLOVE SURG LTX SZ8.5 (GLOVE) ×4 IMPLANT
GLOVE SURG UNDER POLY LF SZ7 (GLOVE) ×10 IMPLANT
GLOVE SURG UNDER POLY LF SZ8.5 (GLOVE) ×4 IMPLANT
GOWN STRL REUS W/ TWL LRG LVL3 (GOWN DISPOSABLE) IMPLANT
GOWN STRL REUS W/ TWL XL LVL3 (GOWN DISPOSABLE) IMPLANT
GOWN STRL REUS W/TWL 2XL LVL3 (GOWN DISPOSABLE) ×4 IMPLANT
GOWN STRL REUS W/TWL LRG LVL3 (GOWN DISPOSABLE)
GOWN STRL REUS W/TWL XL LVL3 (GOWN DISPOSABLE)
GRAFT BONE PROTEIOS LRG 5CC (Orthopedic Implant) ×2 IMPLANT
HEMOSTAT POWDER KIT SURGIFOAM (HEMOSTASIS) ×2 IMPLANT
KIT BASIN OR (CUSTOM PROCEDURE TRAY) ×2 IMPLANT
KIT GRAFTMAG DEL NEURO DISP (NEUROSURGERY SUPPLIES) ×4 IMPLANT
KIT TURNOVER KIT B (KITS) ×2 IMPLANT
MILL MEDIUM DISP (BLADE) ×2 IMPLANT
NEEDLE HYPO 22GX1.5 SAFETY (NEEDLE) ×2 IMPLANT
NS IRRIG 1000ML POUR BTL (IV SOLUTION) ×2 IMPLANT
PACK LAMINECTOMY NEURO (CUSTOM PROCEDURE TRAY) ×2 IMPLANT
PAD ARMBOARD 7.5X6 YLW CONV (MISCELLANEOUS) ×12 IMPLANT
PATTIES SURGICAL .5 X1 (DISPOSABLE) ×2 IMPLANT
PLASMABLADE 3.0S (MISCELLANEOUS) ×2
ROD ARM15T 70MM (Rod) ×2 IMPLANT
ROD ARMADA TITANIUM 5.5X75 (Rod) ×2 IMPLANT
ROD RELINE 0-0 CON M 5.0/6.0MM (Rod) ×4 IMPLANT
SCREW LOCK RELINE 5.5 TULIP (Screw) ×12 IMPLANT
SCREW RELINE-O POLY 6.5X45 (Screw) ×2 IMPLANT
SCREW RELINE-O POLY 6.5X50MM (Screw) ×2 IMPLANT
SPONGE SURGIFOAM ABS GEL 100 (HEMOSTASIS) ×2 IMPLANT
SPONGE T-LAP 4X18 ~~LOC~~+RFID (SPONGE) IMPLANT
SUT PROLENE 6 0 BV (SUTURE) IMPLANT
SUT VIC AB 1 CT1 18XBRD ANBCTR (SUTURE) ×2 IMPLANT
SUT VIC AB 1 CT1 8-18 (SUTURE) ×4
SUT VIC AB 2-0 CP2 18 (SUTURE) ×2 IMPLANT
SUT VIC AB 3-0 SH 8-18 (SUTURE) ×2 IMPLANT
SUT VIC AB 4-0 RB1 18 (SUTURE) ×2 IMPLANT
SYR 3ML LL SCALE MARK (SYRINGE) ×8 IMPLANT
TOWEL GREEN STERILE (TOWEL DISPOSABLE) ×2 IMPLANT
TOWEL GREEN STERILE FF (TOWEL DISPOSABLE) ×2 IMPLANT
TRAY FOLEY MTR SLVR 16FR STAT (SET/KITS/TRAYS/PACK) ×2 IMPLANT
WATER STERILE IRR 1000ML POUR (IV SOLUTION) ×2 IMPLANT

## 2021-09-11 NOTE — H&P (Signed)
William Hendrix is an 80 y.o. male.   Chief Complaint: Back and left greater than right leg pain HPI: Patient is a 80 year old individuals had a previous decompression fusion from L2-L5.  He has had persistent and worsening back pain with radiation mostly on the left side.  He has advanced spondylitic stenosis at the L5-S1 level and after careful consideration of his options I advised surgical decompression and fusion of L5-S1.  The patient has previously had spondylitic disease at L3-4 and L4-5 and subsequently developed adjacent level disease at L2-3's had a number of surgeries over the years but now is requiring stabilization at the lowest level L5-S1.  Discussed the possibility that he may need fixation to the pelvis.  Past Medical History:  Diagnosis Date   AAA (abdominal aortic aneurysm) 12/15/2014   a. Mild aneurysmal dilatation of the infrarenal abdominal aorta 3.2 cm - f/u due by 2019.   Arthritis    "all over" (07/30/2016)   CAD (coronary artery disease)    a. stent to LAD 2000. b. possible spasm by cath 2001. c. IVUS/PTCA/DES to mLAD 05/2013. d. PTCA/DES of dRCA into ostial rPDA 07/2013. e. PTCA of OM2, DES to Cx, DES to [facility] 07/2016.   Chronic bronchitis (HCC)    Chronic diastolic CHF (congestive heart failure) (HCC)    Chronic lower back pain    Chronic neck pain    CKD (chronic kidney disease), stage III (HCC)    Diabetic peripheral neuropathy ([place])    Ejection fraction    55%, 07/2010, mild inferior hypo   GERD (gastroesophageal reflux disease)    Gout    H/O diverticulitis of colon 01/31/2015   H/O hiatal hernia    History of blood transfusion ~ 10/1940   "had pneumonia"   History of kidney stones    HTN (hypertension)    Hyperlipidemia    Melanoma of forearm, left ([place]) 02/2011   with wide excision    Myocardial infarction ([place])    Nephrolithiasis    Obesity    OSA on CPAP     [name] since 2000   Peripheral neuropathy    both feet   Positive D-dimer    a.  significant elevation ,hospital 07/2010, etiology unclear. b. D Dimer chronically > 20.   Renal insufficiency    Skin cancer    Spinal stenosis    a. s/p surgical repair 2013   Spinal stenosis of lumbar region    Type II diabetes mellitus ([place])    Ventral hernia     Past Surgical History:  Procedure Laterality Date   ANTERIOR LAT LUMBAR FUSION  10/22/2017   Procedure: Lumbar two-three Lumbar three-four Lumbar four-five Anteriolateral lumbar interbody fusion with percutaneous pedicle screw fixation and infuse;  Surgeon: [name], MD;  Location: [place];  Service: Neurosurgery;;   APPLICATION OF ROBOTIC ASSISTANCE FOR SPINAL PROCEDURE  10/22/2017   Procedure: APPLICATION OF ROBOTIC ASSISTANCE FOR SPINAL PROCEDURE;  Surgeon: [name], MD;  Location: [place];  Service: Neurosurgery;;   BACK SURGERY     CARDIAC CATHETERIZATION N/A 07/30/2016   Procedure: Left Heart Cath and Coronary Angiography;  Surgeon: Nelva Bush, MD;  Location: [place] CV LAB;  Service: Cardiovascular;  Laterality: N/A;   CARDIAC CATHETERIZATION N/A 07/30/2016   Procedure: Coronary Stent Intervention;  Surgeon: Nelva Bush, MD;  Location: [place] CV LAB;  Service: Cardiovascular;  Laterality: N/A;  Mid CFX and MID RCA   CARDIAC CATHETERIZATION N/A 07/30/2016   Procedure: Coronary Balloon  Angioplasty;  Surgeon: Nelva Bush, MD;  Location: [place] CV LAB;  Service: Cardiovascular;  Laterality: N/A;  OM 1   CARPAL TUNNEL RELEASE Left    CATARACT EXTRACTION W/ INTRAOCULAR LENS  IMPLANT, BILATERAL Bilateral ~ 2014   CERVICAL DISC SURGERY  1990s   CORONARY ANGIOPLASTY WITH STENT PLACEMENT  05/2013   CORONARY ANGIOPLASTY WITH STENT PLACEMENT  07/26/2013   DES to RCA extending to PDA     CORONARY ANGIOPLASTY WITH STENT PLACEMENT  07/30/2016   CORONARY ANGIOPLASTY WITH STENT PLACEMENT  2000   CAD   DENTAL SURGERY  04/2016   "got infected; had to dig it out"   EYE SURGERY     KNEE ARTHROSCOPY  Right 1990's   rt   LEFT AND RIGHT HEART CATHETERIZATION WITH CORONARY ANGIOGRAM N/A 07/26/2013   Procedure: LEFT AND RIGHT HEART CATHETERIZATION WITH CORONARY ANGIOGRAM;  Surgeon: [name], MD;  Location: [place] CATH LAB;  Service: Cardiovascular;  Laterality: N/A;   LEFT HEART CATH AND CORONARY ANGIOGRAPHY N/A 02/26/2017   Procedure: Left Heart Cath and Coronary Angiography;  Surgeon: Sherren Mocha, MD;  Location: [place] CV LAB;  Service: Cardiovascular;  Laterality: N/A;   LEFT HEART CATHETERIZATION WITH CORONARY ANGIOGRAM N/A 05/19/2013   Procedure: LEFT HEART CATHETERIZATION WITH CORONARY ANGIOGRAM;  Surgeon: [name], MD;  Location: [facility] CATH LAB;  Service: Cardiovascular;  Laterality: N/A;   LUMBAR LAMINECTOMY/DECOMPRESSION MICRODISCECTOMY  08/30/2012   Procedure: LUMBAR LAMINECTOMY/DECOMPRESSION MICRODISCECTOMY 2 LEVELS;  Surgeon: [name], MD;  Location: [place] NEURO ORS;  Service: Neurosurgery;  Laterality: Bilateral;  Bilateral Lumbar three-four Lumbar four-five Laminotomies   LUMBAR PERCUTANEOUS PEDICLE SCREW 1 LEVEL Bilateral 10/22/2017   Procedure: LUMBAR PERCUTANEOUS PEDICLE SCREW 1 LEVEL;  Surgeon: [name], MD;  Location: [place];  Service: Neurosurgery;  Laterality: Bilateral;   LUMBAR SPINE SURGERY  04/07/2016   Dr. Ellene Route; "?ruptured disc"   MELANOMA EXCISION Left 02/2011   forearm   NASAL SINUS SURGERY  1970s   "cut windows in sinus pockets"   PERCUTANEOUS CORONARY STENT INTERVENTION (PCI-S)  05/19/2013   Procedure: PERCUTANEOUS CORONARY STENT INTERVENTION (PCI-S);  Surgeon: [name], MD;  Location: [facility] CATH LAB;  Service: Cardiovascular;;   ULNAR TUNNEL RELEASE Left 04/07/2016   Dr. Ellene Route    Family History  Problem Relation Age of Onset   Cancer Mother        intestinal    Heart disease Mother    Cancer Father        prostate   Migraines Daughter    Leukemia Sister    Heart disease Sister    Prostate cancer Other    Kidney cancer Other     Cancer Other        Bladder cancer   Coronary artery disease Other    Hypertension Sister    Hypertension Brother    Heart attack Neg Hx    Stroke Neg Hx    Social History:  reports that he quit smoking about 25 years ago. His smoking use included cigarettes. He has a 90.00 pack-year smoking history. He has never used smokeless tobacco. He reports that he does not drink alcohol and does not use drugs.  Allergies:  Allergies  Allergen Reactions   Brilinta [Ticagrelor] Shortness Of Breath   Insulin Detemir Swelling, Other (See Comments) and Itching    Patient had redness and swelling and tenderness at injection site. Patient had redness and swelling and tenderness at injection site.   Bee Venom  Codeine Other (See Comments)    Makes him "shakey", is OK with hydrocodone GI UPSET & TREMORS Makes him "shakey", is OK with hydrocodone Makes him "shakey", is OK with hydrocodone GI UPSET & TREMORS Other reaction(s): GI issues   Gadobenate     Other reaction(s): fever, sweats and nausea   Insulin Aspart    Lipitor [Atorvastatin] Other (See Comments)    Muscle aches; affects liver function- Patient is currently taking   Novolog Mix [Insulin Aspart Prot & Aspart] Other (See Comments)    Causes skin to swell/itch at injection site    Medications Prior to Admission  Medication Sig Dispense Refill   acetaminophen (TYLENOL) 500 MG tablet Take 500 mg by mouth 2 (two) times daily.      alfuzosin (UROXATRAL) 10 MG 24 hr tablet Take 10 mg by mouth in the morning and at bedtime.     allopurinol (ZYLOPRIM) 300 MG tablet TAKE 1 TABLET EVERY DAY 90 tablet 1   amLODipine (NORVASC) 5 MG tablet Take 1 tablet (5 mg total) by mouth daily. (Patient taking differently: Take 5 mg by mouth at bedtime.) 90 tablet 3   aspirin EC 81 MG tablet Take 81 mg by mouth daily.     atorvastatin (LIPITOR) 40 MG tablet Take 1 tablet (40 mg total) by mouth daily. 90 tablet 3   B Complex Vitamins (B COMPLEX-B12 PO) Take  1 tablet by mouth daily.      baclofen (LIORESAL) 10 MG tablet Take 1-2 tablets (10-20 mg total) by mouth at bedtime as needed for muscle spasms. (Patient taking differently: Take 10 mg by mouth at bedtime.) 60 each 1   Chlorpheniramine Maleate (CHLOR-TRIMETON ALLERGY PO) Take 1 tablet by mouth in the morning and at bedtime.     Cholecalciferol (VITAMIN D3) 50 MCG (2000 UT) capsule Take 2,000 Units by mouth at bedtime.      fenofibrate 160 MG tablet TAKE 1 TABLET AT BEDTIME 90 tablet 1   fluocinonide cream (LIDEX) 1.93 % Apply 1 application topically daily as needed (for rashes).      furosemide (LASIX) 40 MG tablet TAKE 2 TABLETS (80 MG) DAILY. 180 tablet 3   HYDROcodone-acetaminophen (NORCO) 5-325 MG tablet Take 0.5-1 tablets by mouth 2 (two) times daily as needed for moderate pain. (Patient taking differently: Take 0.5 tablets by mouth at bedtime.) 60 tablet 0   insulin NPH Human (NOVOLIN N) 100 UNIT/ML injection Inject 0.5 mLs (50 Units total) into the skin 2 (two) times daily before a meal. (Patient taking differently: Inject 40 Units into the skin 2 (two) times daily before a meal.) 10 mL 11   insulin regular (NOVOLIN R RELION) 100 units/mL injection Inject 25-30 units 3 times a day before meals. (Patient taking differently: Inject 30 Units into the skin 3 (three) times daily before meals.) 60 mL 1   isosorbide mononitrate (IMDUR) 30 MG 24 hr tablet TAKE 1 TABLET (30 MG TOTAL) BY MOUTH DAILY. 90 tablet 0   MAGNESIUM PO Take 1 tablet by mouth at bedtime.     Melatonin 10 MG TABS Take 10 mg by mouth at bedtime.     metoprolol tartrate (LOPRESSOR) 50 MG tablet Take 25 mg by mouth 2 (two) times daily.     pantoprazole (PROTONIX) 20 MG tablet TAKE 1 TABLET EVERY DAY 90 tablet 3   Probiotic Product (PROBIOTIC PO) Take 1 capsule by mouth at bedtime.      Blood Glucose Monitoring Suppl (TRUE METRIX METER) w/Device KIT USE  AS DIRECTED  TO TEST BLOOD GLUCOSE 1 kit 0   DROPLET INSULIN SYRINGE 31G X 5/16"  1 ML MISC USE TO INJECT INSULIN 5 TIMES DAILY 500 each 0   fluticasone (FLONASE) 50 MCG/ACT nasal spray Place 2 sprays into both nostrils daily. (Patient taking differently: Place 2 sprays into both nostrils daily as needed for allergies.) 16 g 1   Insulin Syringe-Needle U-100 (BD INSULIN SYRINGE U/F) 31G X 5/16" 0.3 ML MISC Use to inject insulin 5 times a day. 500 each 11   metoprolol tartrate (LOPRESSOR) 25 MG tablet TAKE 1 TABLET TWICE DAILY (Patient not taking: Reported on 09/02/2021) 180 tablet 1   nitroGLYCERIN (NITROSTAT) 0.4 MG SL tablet Place 1 tablet (0.4 mg total) under the tongue every 5 (five) minutes as needed for chest pain. 100 tablet 3   traMADol (ULTRAM) 50 MG tablet Take 1 tablet (50 mg total) by mouth 2 (two) times daily as needed for moderate pain or severe pain. (Patient not taking: Reported on 08/29/2021) 60 tablet 0   TRUE METRIX BLOOD GLUCOSE TEST test strip TEST BLOOD SUGAR THREE TIMES DAILY AS DIRECTED 300 strip 11   TRUEplus Lancets 33G MISC USE TO CHECK BLOOD SUGAR 3 TIMES A DAY 300 each 3    Results for orders placed or performed during the hospital encounter of 09/11/21 (from the past 48 hour(s))  Glucose, capillary     Status: Abnormal   Collection Time: 09/11/21  6:07 AM  Result Value Ref Range   Glucose-Capillary 132 (H) 70 - 99 mg/dL    Comment: Glucose reference range applies only to samples taken after fasting for at least 8 hours.   No results found.  Review of Systems  Constitutional:  Positive for activity change.  Musculoskeletal:  Positive for back pain, gait problem and myalgias.  Hematological: Negative.   Psychiatric/Behavioral: Negative.    All other systems reviewed and are negative.  Blood pressure 128/69, pulse (!) 58, temperature 97.7 F (36.5 C), temperature source Oral, resp. rate 20, height '5\' 10"'  (1.778 m), weight 110.7 kg, SpO2 98 %. Physical Exam Constitutional:      Appearance: Normal appearance. He is obese.  HENT:     Head:  Normocephalic and atraumatic.     Nose: Nose normal.     Mouth/Throat:     Mouth: Mucous membranes are moist.  Eyes:     Extraocular Movements: Extraocular movements intact.     Pupils: Pupils are equal, round, and reactive to light.  Cardiovascular:     Rate and Rhythm: Normal rate and regular rhythm.     Pulses: Normal pulses.     Heart sounds: Normal heart sounds.  Pulmonary:     Effort: Pulmonary effort is normal.     Breath sounds: Normal breath sounds.  Abdominal:     General: Abdomen is flat. Bowel sounds are normal.     Palpations: Abdomen is soft.  Musculoskeletal:     Cervical back: Normal range of motion.     Comments: See neuro  Skin:    General: Skin is warm and dry.  Neurological:     Mental Status: He is alert.     Comments: Moderate weakness in iliopsoas quadricep tibialis anterior and gastrocs all rated at 4 out of 5.  Absent reflexes in the patella and the Achilles both.  Positive straight leg raising at 15 degrees in either lower extremity.  Patrick's maneuver is negative bilaterally.  Cranial nerve examination is normal.  Upper extremity strength  and reflexes are intact.  Psychiatric:        Mood and Affect: Mood normal.        Behavior: Behavior normal.        Thought Content: Thought content normal.        Judgment: Judgment normal.     Assessment/Plan Lumbar spondylosis and stenosis with radiculopathy L5-S1.  Plan: Decompression fusion L5-S1 with posterior lumbar interbody technique.  Possible fixation to the pelvis.  Earleen Newport, MD 09/11/2021, 7:51 AM

## 2021-09-11 NOTE — Op Note (Signed)
Date of surgery: 09/11/2021 Preoperative diagnosis: Lumbar spondylosis and radiculopathy L5-S1 history of fusion L2-L5. Postoperative diagnosis: Same Procedure: Bilateral laminotomies and foraminotomies L5-S1 with decompression of the L5 and S1 nerve roots.  Posterior lumbar interbody arthrodesis with peek spacers local autograft allograft and Proteus.  Pedicle screw fixation S1 to previous arthrodesis from L2-L5.  Posterior lateral arthrodesis with local autograft allograft and Proteus. Surgeon: [name] Anesthesia: General endotracheal Indications: William Hendrix is an 80 year old individual whose had extensive lumbar spine surgery in the past having had multiple decompressions and fusions and now fused from L2-L5.  L5-S1 had always been fairly stable but the patient has developed severe L5 radicular symptoms and was noted to have advanced degenerative changes at the L5-S1 level.  He was advised regarding surgery to decompress and stabilize L5-S1.  Procedure: Patient was brought to the operating room supine on a stretcher.  After the smooth induction of general endotracheal anesthesia, he was carefully turned prone.  Back was prepped with alcohol DuraPrep and draped in a sterile fashion.  Midline incision was created and carried down to the lumbodorsal fascia which was opened on either side of midline to expose the old hardware at the L5 level.  The sacrum was identified.  The trajectory into the disc space was identified and verified radiographically.  Then bilateral laminotomies were created removing the inferior margin lamina of L5 out to and including the facet joint at the L5-S1 level.  This allowed access to the lateral aspect of the dural tube so that the L5 nerve root could be decompressed in its travel through the foramen.  The nerve root was noted be quite tight in the extraforaminal zone and this was largely due to an overgrowth of the superior articular process of S1.  This was carefully  resected on both sides.  Once the decompression the L5 nerve root was obtained the disc base was isolated.  Then by carefully protecting the dura medially the disc space was entered and a total discectomy was performed at the L5-S1 level the space was noted to be quite tight and did not distract easily.  A total of 1 mm of distraction was able to be obtained and with this after decorticating the endplates adequately were able to size the interspace for an appropriate size spacer which measured 8 x 9 x 23 mm in size with 12 degrees of lordosis.  To the spacers were then filled with the autograft allograft mixture along with the Proteus and a total of 15 cc of bone graft was packed into the interspace at the L5-S1 level along with the 2 spacers.  Then pedicle entry sites were chosen in the sacrum and here 6.5 x 45 mm screws were placed on the left side and a 6.5 x 50 mm screw was placed on the right side after checking and verifying fluoroscopically.  Then side connectors were placed between the L4 and L5 screws on each side of the previously placed hardware.  On the right side a straight rod was used measuring 75 mm in length on the left side the rod required slight contouring to fit in the saddles comfortably securing the sacrum to the previous construct L2-L5.  Lateral gutters which had previously been decorticated were packed with approximately 3 cc of bone graft in each lateral gutter.  The system was tightened in a neutral construct and final radiographic appearance verified good alignment of L2 to the sacrum now with fixation in place.  Hemostasis in the soft  tissues was obtained meticulously care was taken to make sure that the L5 and the S1 nerve roots and the common dural tube were well decompressed and then 25 cc of half percent Marcaine was injected into the paraspinous muscles and fascia and the fascia was closed with #1 Vicryl interrupted fashion 2-0 Vicryl was used in subcutaneous tissues 3-0 Vicryl  subcuticularly and 4-0 Vicryl in the final subcuticular closure Dermabond was placed on the skin the patient tolerated procedure well blood loss was estimated at 100 cc.

## 2021-09-11 NOTE — Progress Notes (Signed)
Orthopedic Tech Progress Note Patient Details:  William Hendrix Jun 09, 1941 [id number]  Patient ID: [name], male   DOB: Jul 10, 1941, 80 y.o.   MRN: [id number] Spoke with RN on 3C, patient already has brace.  [place] A Amarachukwu Lakatos 09/11/2021, 9:00 PM

## 2021-09-11 NOTE — Transfer of Care (Signed)
Immediate Anesthesia Transfer of Care Note  Patient: William Hendrix  Procedure(s) Performed: Lumbar Five-Sacral one Postrior Lumbar Interbody Fusion (Spine Lumbar)  Patient Location: PACU  Anesthesia Type:General  Level of Consciousness: awake, alert  and patient cooperative  Airway & Oxygen Therapy: Patient Spontanous Breathing  Post-op Assessment: Report given to RN and Post -op Vital signs reviewed and stable  Post vital signs: Reviewed and stable  Last Vitals:  Vitals Value Taken Time  BP 128/71 09/11/21 1303  Temp    Pulse 73 09/11/21 1307  Resp 17 09/11/21 1307  SpO2 99 % 09/11/21 1307  Vitals shown include unvalidated device data.  Last Pain:  Vitals:   09/11/21 0612  TempSrc: Oral  PainSc: 7       Patients Stated Pain Goal: 2 (20/23/34 3568)  Complications: No notable events documented.

## 2021-09-11 NOTE — Progress Notes (Signed)
Orthopedic Tech Progress Note Patient Details:  MAVRYK PINO February 03, 1941 [id number]  Spoke to PACU RN, Fanny Skates, who called re: lumbar quick draw brace order.  Pt reports he has a brace at home and will get someone to bring it to the hospital.   Please call ortho tech if he doesn't end up getting his brace from home.   Thanks,  Verdene Lennert, PT, DPT  Acute Rehabilitation Ortho Tech Supervisor [phone] pager #[phone] office     Wells Guiles B Zebadiah Willert 09/11/2021, 2:00 PM

## 2021-09-11 NOTE — Anesthesia Procedure Notes (Signed)
Procedure Name: Intubation Date/Time: 09/11/2021 8:15 AM Performed by: Thelma Comp, CRNA Pre-anesthesia Checklist: Patient identified, Emergency Drugs available, Suction available and Patient being monitored Patient Re-evaluated:Patient Re-evaluated prior to induction Oxygen Delivery Method: Circle System Utilized Preoxygenation: Pre-oxygenation with 100% oxygen Induction Type: IV induction Ventilation: Mask ventilation without difficulty Laryngoscope Size: Mac and 4 Grade View: Grade I Tube type: Oral Tube size: 7.5 mm Number of attempts: 1 Airway Equipment and Method: Stylet Placement Confirmation: ETT inserted through vocal cords under direct vision, positive ETCO2 and breath sounds checked- equal and bilateral Secured at: 23 cm Tube secured with: Tape Dental Injury: Teeth and Oropharynx as per pre-operative assessment

## 2021-09-11 NOTE — Anesthesia Postprocedure Evaluation (Signed)
Anesthesia Post Note  Patient: William Hendrix  Procedure(s) Performed: Lumbar Five-Sacral one Postrior Lumbar Interbody Fusion (Spine Lumbar)     Patient location during evaluation: PACU Anesthesia Type: General Level of consciousness: awake and alert Pain management: pain level controlled Vital Signs Assessment: post-procedure vital signs reviewed and stable Respiratory status: spontaneous breathing, nonlabored ventilation, respiratory function stable and patient connected to nasal cannula oxygen Cardiovascular status: blood pressure returned to baseline and stable Postop Assessment: no apparent nausea or vomiting Anesthetic complications: no   No notable events documented.  Last Vitals:  Vitals:   09/11/21 1448 09/11/21 1630  BP: (!) 146/55 (!) 158/53  Pulse: 71 80  Resp: 18 16  Temp:  36.4 C  SpO2: 95% 100%    Last Pain:  Vitals:   09/11/21 1448  TempSrc:   PainSc: 2                  Tiajuana Amass

## 2021-09-12 LAB — GLUCOSE, CAPILLARY: Glucose-Capillary: 198 mg/dL — ABNORMAL HIGH (ref 70–99)

## 2021-09-12 MED ORDER — OXYCODONE-ACETAMINOPHEN 5-325 MG PO TABS: 1.0000 | ORAL_TABLET | ORAL | 0 refills | Status: DC | PRN

## 2021-09-12 MED ORDER — METHOCARBAMOL 500 MG PO TABS: 500.0000 mg | ORAL_TABLET | Freq: Four times a day (QID) | ORAL | 3 refills | Status: DC | PRN

## 2021-09-12 NOTE — Discharge Summary (Signed)
Physician Discharge Summary  Patient ID: William Hendrix MRN: [id number] DOB/AGE: Nov 18, 1940 80 y.o.  Admit date: 09/11/2021 Discharge date: 09/12/2021  Admission Diagnoses: Lumbar stenosis L5-S1 with lumbar radiculopathy, neurogenic claudication.  History of fusion L2-L5.  Diabetes mellitus.  Morbid obesity.  Discharge Diagnoses: Lumbar stenosis L5-S1 with lumbar radiculopathy.  Neurogenic claudication.  History of fusion L2-L5.  Diabetes mellitus.  Morbid obesity. Principal Problem:   Lumbar stenosis with neurogenic claudication   Discharged Condition: good  Hospital Course: Patient was admitted to undergo surgical decompression at the level of L5-S1 with stabilization from L2 to the sacrum.  Tolerated surgery well.  He is ambulatory.  Incision is clean and dry.  Discharged home with home health occupational therapy and physical therapy.  Consults: None  Significant Diagnostic Studies: None  Treatments: surgery: See op note  Discharge Exam: Blood pressure 128/66, pulse 79, temperature 98.9 F (37.2 C), temperature source Oral, resp. rate 18, height '5\' 10"'  (1.778 m), weight 110.7 kg, SpO2 97 %. Incision is clean and dry Station and gait are intact with use of walker.  Disposition: Discharge disposition: 01-Home or Self Care       Discharge Instructions     Call MD for:  redness, tenderness, or signs of infection (pain, swelling, redness, odor or green/yellow discharge around incision site)   Complete by: As directed    Call MD for:  severe uncontrolled pain   Complete by: As directed    Call MD for:  temperature >100.4   Complete by: As directed    Diet - low sodium heart healthy   Complete by: As directed    Discharge wound care:   Complete by: As directed    Okay to shower. Do not apply salves or appointments to incision. No heavy lifting with the upper extremities greater than 10 pounds. May resume driving when not requiring pain medication and patient feels  comfortable with doing so.   Incentive spirometry RT   Complete by: As directed    Increase activity slowly   Complete by: As directed       Allergies as of 09/12/2021       Reactions   Brilinta [ticagrelor] Shortness Of Breath   Insulin Detemir Swelling, Other (See Comments), Itching   Patient had redness and swelling and tenderness at injection site. Patient had redness and swelling and tenderness at injection site.   Bee Venom    Codeine Other (See Comments)   Makes him "shakey", is OK with hydrocodone GI UPSET & TREMORS Makes him "shakey", is OK with hydrocodone Makes him "shakey", is OK with hydrocodone GI UPSET & TREMORS Other reaction(s): GI issues   Gadobenate    Other reaction(s): fever, sweats and nausea   Insulin Aspart    Lipitor [atorvastatin] Other (See Comments)   Muscle aches; affects liver function- Patient is currently taking   Novolog Mix [insulin Aspart Prot & Aspart] Other (See Comments)   Causes skin to swell/itch at injection site        Medication List     TAKE these medications    acetaminophen 500 MG tablet Commonly known as: TYLENOL Take 500 mg by mouth 2 (two) times daily.   alfuzosin 10 MG 24 hr tablet Commonly known as: UROXATRAL Take 10 mg by mouth in the morning and at bedtime.   allopurinol 300 MG tablet Commonly known as: ZYLOPRIM TAKE 1 TABLET EVERY DAY   amLODipine 5 MG tablet Commonly known as: NORVASC Take 1 tablet (5 mg  total) by mouth daily. What changed: when to take this   aspirin EC 81 MG tablet Take 81 mg by mouth daily.   atorvastatin 40 MG tablet Commonly known as: LIPITOR Take 1 tablet (40 mg total) by mouth daily.   B COMPLEX-B12 PO Take 1 tablet by mouth daily.   baclofen 10 MG tablet Commonly known as: LIORESAL Take 1-2 tablets (10-20 mg total) by mouth at bedtime as needed for muscle spasms. What changed:  how much to take when to take this   CHLOR-TRIMETON ALLERGY PO Take 1 tablet by mouth in  the morning and at bedtime.   fenofibrate 160 MG tablet TAKE 1 TABLET AT BEDTIME   fluocinonide cream 0.05 % Commonly known as: LIDEX Apply 1 application topically daily as needed (for rashes).   fluticasone 50 MCG/ACT nasal spray Commonly known as: FLONASE Place 2 sprays into both nostrils daily. What changed:  when to take this reasons to take this   furosemide 40 MG tablet Commonly known as: LASIX TAKE 2 TABLETS (80 MG) DAILY.   HYDROcodone-acetaminophen 5-325 MG tablet Commonly known as: Norco Take 0.5-1 tablets by mouth 2 (two) times daily as needed for moderate pain. What changed:  how much to take when to take this   insulin NPH Human 100 UNIT/ML injection Commonly known as: NovoLIN N Inject 0.5 mLs (50 Units total) into the skin 2 (two) times daily before a meal. What changed: how much to take   insulin regular 100 units/mL injection Commonly known as: NovoLIN R ReliOn Inject 25-30 units 3 times a day before meals. What changed:  how much to take how to take this when to take this additional instructions   Insulin Syringe-Needle U-100 31G X 5/16" 0.3 ML Misc Commonly known as: BD Insulin Syringe U/F Use to inject insulin 5 times a day.   Droplet Insulin Syringe 31G X 5/16" 1 ML Misc Generic drug: Insulin Syringe-Needle U-100 USE TO INJECT INSULIN 5 TIMES DAILY   isosorbide mononitrate 30 MG 24 hr tablet Commonly known as: IMDUR TAKE 1 TABLET (30 MG TOTAL) BY MOUTH DAILY.   MAGNESIUM PO Take 1 tablet by mouth at bedtime.   Melatonin 10 MG Tabs Take 10 mg by mouth at bedtime.   methocarbamol 500 MG tablet Commonly known as: ROBAXIN Take 1 tablet (500 mg total) by mouth every 6 (six) hours as needed for muscle spasms.   metoprolol tartrate 50 MG tablet Commonly known as: LOPRESSOR Take 25 mg by mouth 2 (two) times daily.   metoprolol tartrate 25 MG tablet Commonly known as: LOPRESSOR TAKE 1 TABLET TWICE DAILY   nitroGLYCERIN 0.4 MG SL  tablet Commonly known as: Nitrostat Place 1 tablet (0.4 mg total) under the tongue every 5 (five) minutes as needed for chest pain.   oxyCODONE-acetaminophen 5-325 MG tablet Commonly known as: PERCOCET/ROXICET Take 1-2 tablets by mouth every 4 (four) hours as needed for moderate pain or severe pain.   pantoprazole 20 MG tablet Commonly known as: PROTONIX TAKE 1 TABLET EVERY DAY   PROBIOTIC PO Take 1 capsule by mouth at bedtime.   traMADol 50 MG tablet Commonly known as: ULTRAM Take 1 tablet (50 mg total) by mouth 2 (two) times daily as needed for moderate pain or severe pain.   True Metrix Blood Glucose Test test strip Generic drug: glucose blood TEST BLOOD SUGAR THREE TIMES DAILY AS DIRECTED   True Metrix Meter w/Device Kit USE AS DIRECTED  TO TEST BLOOD GLUCOSE   TRUEplus  Lancets 33G Misc USE TO CHECK BLOOD SUGAR 3 TIMES A DAY   Vitamin D3 50 MCG (2000 UT) capsule Take 2,000 Units by mouth at bedtime.               Discharge Care Instructions  (From admission, onward)           Start     Ordered   09/12/21 0000  Discharge wound care:       Comments: Okay to shower. Do not apply salves or appointments to incision. No heavy lifting with the upper extremities greater than 10 pounds. May resume driving when not requiring pain medication and patient feels comfortable with doing so.   09/12/21 0931             Signed: Blanchie Dessert Hayward Rylander 09/12/2021, 9:32 AM

## 2021-09-12 NOTE — Plan of Care (Signed)
  Problem: Safety: Goal: Ability to remain free from injury will improve Outcome: Completed/Met   Problem: Education: Goal: Ability to verbalize activity precautions or restrictions will improve Outcome: Completed/Met Goal: Knowledge of the prescribed therapeutic regimen will improve Outcome: Completed/Met Goal: Understanding of discharge needs will improve Outcome: Completed/Met   Problem: Activity: Goal: Ability to avoid complications of mobility impairment will improve Outcome: Completed/Met Goal: Ability to tolerate increased activity will improve Outcome: Completed/Met Goal: Will remain free from falls Outcome: Completed/Met   Problem: Bowel/Gastric: Goal: Gastrointestinal status for postoperative course will improve Outcome: Completed/Met   Problem: Clinical Measurements: Goal: Ability to maintain clinical measurements within normal limits will improve Outcome: Completed/Met Goal: Postoperative complications will be avoided or minimized Outcome: Completed/Met Goal: Diagnostic test results will improve Outcome: Completed/Met   Problem: Pain Management: Goal: Pain level will decrease Outcome: Completed/Met   Problem: Skin Integrity: Goal: Will show signs of wound healing Outcome: Completed/Met   Problem: Health Behavior/Discharge Planning: Goal: Identification of resources available to assist in meeting health care needs will improve Outcome: Completed/Met   Problem: Bladder/Genitourinary: Goal: Urinary functional status for postoperative course will improve Outcome: Completed/Met

## 2021-09-12 NOTE — Evaluation (Signed)
Physical Therapy Evaluation Patient Details Name: William Hendrix MRN: [id number] DOB: 1941/09/11 Today's Date: 09/12/2021  History of Present Illness  Pt is an 80 y/o male that presented w/ back and leg pain, L>R. PMHx positive for lumbar spine decompression and fusion from L2-L5. Developed severe L5 radicular symptoms and noted advanced degenerative changes at L5-S1 level. Elected to undergo surgery for bilat laminotomies and foraminotomies of L5-S1 w/ decompression of L5 and S1 nerve roots on 09/11/2021.  Clinical Impression  Pt tolerated treatment well. Indicates that he has had back surgery in the past and is familiar with the process, educated the pt and family member on spinal precautions. Pt reports that he's independent with most ADLs at baseline, limitations secondary to pain; however, his daughter has been staying with him and his wife as his wife has also been recovering from surgery. Pt would do well with continued PT services. Recommend HHPT to progress mobility, ADL independence, and decrease caregiver burden.      Recommendations for follow up therapy are one component of a multi-disciplinary discharge planning process, led by the attending physician.  Recommendations may be updated based on patient status, additional functional criteria and insurance authorization.  Follow Up Recommendations Home health PT    Assistance Recommended at Discharge Intermittent Supervision/Assistance  Functional Status Assessment Patient has had a recent decline in their functional status and demonstrates the ability to make significant improvements in function in a reasonable and predictable amount of time.  Equipment Recommendations  None recommended by PT    Recommendations for Other Services       Precautions / Restrictions Precautions Precautions: Back;Fall Precaution Booklet Issued: Yes (comment) Precaution Comments: Educated pt on spinal precautions Required Braces or Orthoses: Spinal  Brace Spinal Brace: Applied in supine position Restrictions Weight Bearing Restrictions: No      Mobility  Bed Mobility Overal bed mobility: Modified Independent             General bed mobility comments: Pt reports having an adaptable bed at home that can adjust HOB and foot of bed. Pt was received sitting up EOB.    Transfers Overall transfer level: Needs assistance Equipment used: Rolling walker (2 wheels) Transfers: Sit to/from Stand Sit to Stand: Modified independent (Device/Increase time)           General transfer comment: Requires RW and increased time to power up. Required VC for hand placement on the RW and bed to power up into standing.    Ambulation/Gait Ambulation/Gait assistance: Modified independent (Device/Increase time) Gait Distance (Feet): 300 Feet Assistive device: Rolling walker (2 wheels) Gait Pattern/deviations: Decreased step length - right;Decreased step length - left;Decreased stride length;Trunk flexed;Wide base of support;Step-through pattern;Shuffle Gait velocity: decreased Gait velocity interpretation: <1.31 ft/sec, indicative of household ambulator   General Gait Details: Requires VC to maintain proximity to RW and RW safety. Presents w/ wide and external rotation at the foot/ankle. Fatigues during longer distance and requires frequent breaks for rest. Reported that he went on a longer walk before PT and that he needed a WC to return to his room as he felt too fatigued to continue walking.  Stairs            Wheelchair Mobility    Modified Rankin (Stroke Patients Only)       Balance Overall balance assessment: Needs assistance Sitting-balance support: Bilateral upper extremity supported;Feet supported Sitting balance-Leahy Scale: Fair     Standing balance support: Bilateral upper extremity supported;During functional activity;Reliant on assistive device  for balance Standing balance-Leahy Scale: Poor Standing balance  comment: Requires RW for BUE support to maintain static and dynamic standing balance                             Pertinent Vitals/Pain Pain Assessment: No/denies pain Faces Pain Scale: Hurts a little bit Pain Location: Back and ribs Pain Descriptors / Indicators: Discomfort;Grimacing Pain Intervention(s): Monitored during session;Repositioned    Home Living Family/patient expects to be discharged to:: Private residence Living Arrangements: Spouse/significant other Available Help at Discharge: Family;Friend(s);Available 24 hours/day Type of Home: House Home Access: Stairs to enter   [employer] of Steps: 1   Home Layout: One level Home Equipment: Conservation officer, nature (2 wheels);Shower seat;Grab bars - toilet;Grab bars - tub/shower;Hand held shower head Additional Comments: Pt also states that he has been the primary caretaker of his wife as she has not been in great shape and is recovering from her own surgery that took place in July.    Prior Function Prior Level of Function : Independent/Modified Independent;Driving             Mobility Comments: Using a RW       Hand Dominance   Dominant Hand: Right    Extremity/Trunk Assessment   Upper Extremity Assessment Upper Extremity Assessment: Overall WFL for tasks assessed    Lower Extremity Assessment Lower Extremity Assessment: Generalized weakness (Consistent with pre-op diagnosis)    Cervical / Trunk Assessment Cervical / Trunk Assessment: Kyphotic;Back Surgery  Communication   Communication: No difficulties  Cognition Arousal/Alertness: Awake/alert Behavior During Therapy: WFL for tasks assessed/performed Overall Cognitive Status: Within Functional Limits for tasks assessed                                          General Comments General comments (skin integrity, edema, etc.): Educated on compensatory strategies for all ADL's    Exercises     Assessment/Plan    PT  Assessment Patient needs continued PT services  PT Problem List Decreased strength;Decreased activity tolerance;Decreased balance;Decreased mobility;Decreased knowledge of use of DME;Decreased safety awareness;Decreased knowledge of precautions;Pain       PT Treatment Interventions Gait training;DME instruction;Functional mobility training;Therapeutic activities;Therapeutic exercise;Neuromuscular re-education;Patient/family education    PT Goals (Current goals can be found in the Care Plan section)  Acute Rehab PT Goals Patient Stated Goal: Home today PT Goal Formulation: With patient/family Time For Goal Achievement: 09/19/21 Potential to Achieve Goals: Good    Frequency Min 5X/week   Barriers to discharge        Co-evaluation               AM-PAC PT "6 Clicks" Mobility  Outcome Measure Help needed turning from your back to your side while in a flat bed without using bedrails?: A Little Help needed moving from lying on your back to sitting on the side of a flat bed without using bedrails?: A Little Help needed moving to and from a bed to a chair (including a wheelchair)?: A Little Help needed standing up from a chair using your arms (e.g., wheelchair or bedside chair)?: A Little Help needed to walk in hospital room?: A Little Help needed climbing 3-5 steps with a railing? : A Little 6 Click Score: 18    End of Session Equipment Utilized During Treatment: Back brace Activity Tolerance: Patient tolerated treatment  well Patient left: in bed;with call bell/phone within reach;with family/visitor present Nurse Communication: Mobility status PT Visit Diagnosis: Unsteadiness on feet (R26.81);Pain Pain - part of body:  (back)    Time: 3491-7915 PT Time Calculation (min) (ACUTE ONLY): 26 min   Charges:   PT Evaluation $PT Eval Low Complexity: 1 Low PT Treatments $Gait Training: 8-22 mins        Dana Pager:  [phone] Office: [place] 09/12/2021, 1:31 PM

## 2021-09-12 NOTE — Evaluation (Signed)
Occupational Therapy Evaluation Patient Details Name: William Hendrix MRN: [id number] DOB: 1941/06/10 Today's Date: 09/12/2021   History of Present Illness Pt is an 80 y/o male that presented w/ back and leg pain, L>R. PMHx positive for lumbar spine decompression and fusion from L2-L5. Developed severe L5 radicular symptoms and noted advanced degenerative changes at L5-S1 level. Elected to undergo surgery for bilat laminotomies and foraminotomies of L5-S1 w/ decompression of L5 and S1 nerve roots on 09/11/2021.   Clinical Impression   Pt admitted for procedure listed above. PTA pt reported that he was independent with all ADL's and IADL's, including being a caregiver for his wife. At this time, pt presents with increased pain and weakness, requiring set up to min A for all ADL's and functional mobility. Pt educated on compensatory strategies, following his spinal precautions, which he was able to recall independently. Recommending HHOT on discharge to assist pt in returning to independence. He has no further acute OT needs.       Recommendations for follow up therapy are one component of a multi-disciplinary discharge planning process, led by the attending physician.  Recommendations may be updated based on patient status, additional functional criteria and insurance authorization.   Follow Up Recommendations  Home health OT    Assistance Recommended at Discharge Set up Supervision/Assistance  Functional Status Assessment  Patient has had a recent decline in their functional status and demonstrates the ability to make significant improvements in function in a reasonable and predictable amount of time.  Equipment Recommendations  None recommended by OT    Recommendations for Other Services       Precautions / Restrictions Precautions Precautions: Back;Fall Precaution Booklet Issued: Yes (comment) Precaution Comments: Educated pt on spinal precautions Required Braces or Orthoses: Spinal  Brace Spinal Brace: Applied in sitting position Restrictions Weight Bearing Restrictions: No      Mobility Bed Mobility Overal bed mobility: Modified Independent             General bed mobility comments: Pt reports having an adaptable bed at home that can adjust HOB and foot of bed    Transfers Overall transfer level: Needs assistance Equipment used: Rolling walker (2 wheels) Transfers: Sit to/from Stand Sit to Stand: Min guard           General transfer comment: Requires RW and increased time to power up. Required VC for hand placement on the RW and bed to power up into standing.      Balance Overall balance assessment: Needs assistance Sitting-balance support: Bilateral upper extremity supported;Feet supported Sitting balance-Leahy Scale: Fair     Standing balance support: Bilateral upper extremity supported;During functional activity;Reliant on assistive device for balance Standing balance-Leahy Scale: Poor Standing balance comment: Requires RW for BUE support to maintain static and dynamic standing balance                           ADL either performed or assessed with clinical judgement   ADL Overall ADL's : Needs assistance/impaired Eating/Feeding: Set up;Sitting   Grooming: Set up;Sitting   Upper Body Bathing: Set up;Sitting   Lower Body Bathing: Minimal assistance;Sitting/lateral leans;Sit to/from stand   Upper Body Dressing : Set up;Sitting   Lower Body Dressing: Minimal assistance;Sitting/lateral leans;Sit to/from stand   Toilet Transfer: Min guard;Ambulation   Toileting- Clothing Manipulation and Hygiene: Minimal assistance;Sitting/lateral lean;Sit to/from stand       Functional mobility during ADLs: Min guard;Rolling walker (2 wheels) General ADL  Comments: Pt weak and having difficulty completing LB ADL's while following precautions. At times needs min A to safely complete tasks.     Vision Baseline Vision/History: 1 Wears  glasses Ability to See in Adequate Light: 0 Adequate Patient Visual Report: No change from baseline Vision Assessment?: No apparent visual deficits     Perception     Praxis      Pertinent Vitals/Pain Pain Assessment: Faces Faces Pain Scale: Hurts a little bit Pain Location: Back and ribs Pain Descriptors / Indicators: Discomfort;Grimacing Pain Intervention(s): Monitored during session;Repositioned     Hand Dominance Right   Extremity/Trunk Assessment Upper Extremity Assessment Upper Extremity Assessment: Overall WFL for tasks assessed   Lower Extremity Assessment Lower Extremity Assessment: Defer to PT evaluation   Cervical / Trunk Assessment Cervical / Trunk Assessment: Kyphotic;Back Surgery   Communication Communication Communication: No difficulties   Cognition Arousal/Alertness: Awake/alert Behavior During Therapy: WFL for tasks assessed/performed Overall Cognitive Status: Within Functional Limits for tasks assessed                                       General Comments  Educated on compensatory strategies for all ADL's    Exercises     Shoulder Instructions      Home Living Family/patient expects to be discharged to:: Private residence Living Arrangements: Spouse/significant other Available Help at Discharge: Family;Friend(s);Available 24 hours/day Type of Home: House Home Access: Stairs to enter [employer] of Steps: 1   Home Layout: One level     Bathroom Shower/Tub: Occupational psychologist: Handicapped height Bathroom Accessibility: Yes   Home Equipment: Conservation officer, nature (2 wheels);Shower seat;Grab bars - toilet;Grab bars - tub/shower;Hand held shower head   Additional Comments: Pt also states that he has been the primary caretaker of his wife as she has not been in great shape and is recovering from her own surgery that took place in July.      Prior Functioning/Environment Prior Level of Function :  Independent/Modified Independent;Driving                        OT Problem List: Decreased strength;Decreased activity tolerance;Impaired balance (sitting and/or standing);Pain      OT Treatment/Interventions:      OT Goals(Current goals can be found in the care plan section) Acute Rehab OT Goals Patient Stated Goal: To get to where he can help his wife again OT Goal Formulation: With patient Time For Goal Achievement: 09/12/21 Potential to Achieve Goals: Good  OT Frequency:     Barriers to D/C:            Co-evaluation              AM-PAC OT "6 Clicks" Daily Activity     Outcome Measure Help from another person eating meals?: A Little Help from another person taking care of personal grooming?: A Little Help from another person toileting, which includes using toliet, bedpan, or urinal?: A Little Help from another person bathing (including washing, rinsing, drying)?: A Little Help from another person to put on and taking off regular upper body clothing?: A Little Help from another person to put on and taking off regular lower body clothing?: A Little 6 Click Score: 18   End of Session Equipment Utilized During Treatment: Rolling walker (2 wheels) Nurse Communication: Mobility status  Activity Tolerance: Patient tolerated treatment well  Patient left: in bed;with call bell/phone within reach;with family/visitor present  OT Visit Diagnosis: Unsteadiness on feet (R26.81);Muscle weakness (generalized) (M62.81)                Time: 4944-9675 OT Time Calculation (min): 42 min Charges:  OT General Charges $OT Visit: 1 Visit OT Evaluation $OT Eval Low Complexity: 1 Low OT Treatments $Self Care/Home Management : 23-37 mins  Alessandria Henken H., OTR/L Acute Rehabilitation  Gianlucca Szymborski Elane Kareli Hossain 09/12/2021, 12:00 PM

## 2021-09-12 NOTE — Progress Notes (Signed)
Patient awaiting transport via wheelchair by volunteer for discharge home; in no acute distress nor complaints of pain nor discomfort; incision on his back with honeycomb dressing and is clean, dry and intact; room was checked and accounted for all his belongings; discharge instructions concerning his medications, incision care, follow up appointment and when to call the doctor as needed were all discussed with patient and his son by RN and both verbalized understanding on the instructions given.

## 2021-09-12 NOTE — TOC Progression Note (Signed)
Transition of Care [facility]) - Progression Note    Patient Details  Name: William Hendrix MRN: [id number] Date of Birth: 1941/04/10  Transition of Care [facility]) CM/SW [place], RN Phone Number:[phone]  09/12/2021, 10:59 AM  Clinical Narrative:    [facility] consulted for patient discharging home with [facility] needs. CM at bedside to offer choice. Patient requesting [place] with [facility]. [place] referral called to [facility] with [place]. Alvis Lemmings has accepted and will provide [facility] services.    Expected Discharge Plan: [place] Barriers to Discharge: No Barriers Identified  Expected Discharge Plan and Services Expected Discharge Plan: [place] In-house Referral: NA Discharge Planning Services: CM Consult Post Acute Care Choice: [place] arrangements for the past 2 months: Single Family Home Expected Discharge Date: 09/12/21                                     Social Determinants of Health (SDOH) Interventions    Readmission Risk Interventions No flowsheet data found.

## 2021-09-12 NOTE — Discharge Instructions (Signed)
Wound Care Leave incision open to air. You may shower. Do not scrub directly on incision.  Do not put any creams, lotions, or ointments on incision.  Activity Walk each and every day, increasing distance each day. No lifting greater than 5 lbs.  Avoid bending, arching, and twisting. No driving for 2 weeks; may ride as a passenger locally. If provided with back brace, wear when out of bed.  It is not necessary to wear in bed.  Diet Resume your normal diet.   Return to Work Will be discussed at you follow up appointment.  Call Your Doctor If Any of These Occur Redness, drainage, or swelling at the wound.  Temperature greater than 101 degrees. Severe pain not relieved by pain medication. Incision starts to come apart.  Follow Up Appt Call today for appointment in 3-4 weeks ([phone]) or for problems.

## 2021-09-15 ENCOUNTER — Other Ambulatory Visit: Payer: Self-pay | Admitting: *Deleted

## 2021-09-15 DIAGNOSIS — I7143 Infrarenal abdominal aortic aneurysm, without rupture: Secondary | ICD-10-CM

## 2021-09-15 NOTE — Progress Notes (Signed)
VAS 

## 2021-09-24 ENCOUNTER — Ambulatory Visit (HOSPITAL_COMMUNITY): Admission: RE | Admit: 2021-09-24 | Payer: Medicare HMO | Source: Ambulatory Visit

## 2021-09-24 ENCOUNTER — Telehealth: Payer: Self-pay | Admitting: *Deleted

## 2021-09-24 NOTE — Telephone Encounter (Signed)
Transition Care Management Unsuccessful Follow-up Telephone Call  Date of discharge and from where: 09/12/21  [facility]  Attempts:  1st Attempt  Reason for unsuccessful TCM follow-up call:  No answer/busy  Jacqlyn Larsen [facility], BSN RN Case Manager [phone]

## 2021-09-26 ENCOUNTER — Telehealth: Payer: Self-pay

## 2021-09-26 NOTE — Telephone Encounter (Signed)
Transition Care Management Follow-up Telephone Call Date of discharge and from where: 09/12/2021 How have you been since you were released from the hospital? Doing well Any questions or concerns? No  Items Reviewed: Did the pt receive and understand the discharge instructions provided? Yes  Medications obtained and verified? Yes  Other? No  Any new allergies since your discharge? No  Dietary orders reviewed? No Do you have support at home? Yes   Home Care and Equipment/Supplies: Were home health services ordered? yes If so, what is the name of the agency? Yes  Has the agency set up a time to come to the patient's home? yes Were any new equipment or medical supplies ordered?  No What is the name of the medical supply agency?  Were you able to get the supplies/equipment? not applicable Do you have any questions related to the use of the equipment or supplies? No  Functional Questionnaire: (I = Independent and D = Dependent) ADLs: I  Bathing/Dressing- I  Meal Prep- I  Eating- I  Maintaining continence- I  Transferring/Ambulation- I  Managing Meds- I  Follow up appointments reviewed:  PCP Hospital f/u appt confirmed? No   Specialist Hospital f/u appt confirmed? Yes  Scheduled to see  SURGEON 10/01/2021  Are transportation arrangements needed? No  If their condition worsens, is the pt aware to call PCP or go to the Emergency Dept.? Yes Was the patient provided with contact information for the PCP's office or ED? Yes Was to pt encouraged to call back with questions or concerns? Yes  Tomasa Rand, RN, BSN, CEN [facility] [employer] [phone]

## 2021-09-30 ENCOUNTER — Ambulatory Visit: Payer: Medicare HMO | Admitting: Internal Medicine

## 2021-10-01 DIAGNOSIS — M5416 Radiculopathy, lumbar region: Secondary | ICD-10-CM | POA: Diagnosis not present

## 2021-10-04 ENCOUNTER — Other Ambulatory Visit: Payer: Self-pay | Admitting: Family Medicine

## 2021-10-07 NOTE — Telephone Encounter (Signed)
Requesting:  tramadol 50mg   Contract: 09/24/2020 UDS: None Last Visit: 08/01/2021 w/ Percell Miller  Next Visit: 10/27/2021 Last Refill: 03/12/2021 #60 and 0RF  Please Advise

## 2021-10-16 ENCOUNTER — Encounter: Payer: Self-pay | Admitting: *Deleted

## 2021-10-16 ENCOUNTER — Other Ambulatory Visit: Payer: Self-pay

## 2021-10-16 ENCOUNTER — Ambulatory Visit (HOSPITAL_COMMUNITY)
Admission: RE | Admit: 2021-10-16 | Discharge: 2021-10-16 | Disposition: A | Discharge status: Home / Self Care | Payer: Medicare HMO | Source: Ambulatory Visit | Attending: Cardiology | Admitting: Cardiology

## 2021-10-16 DIAGNOSIS — I7143 Infrarenal abdominal aortic aneurysm, without rupture: Secondary | ICD-10-CM | POA: Diagnosis not present

## 2021-10-17 ENCOUNTER — Encounter: Payer: Self-pay | Admitting: Internal Medicine

## 2021-10-17 ENCOUNTER — Ambulatory Visit (INDEPENDENT_AMBULATORY_CARE_PROVIDER_SITE_OTHER): Payer: Medicare HMO | Admitting: Internal Medicine

## 2021-10-17 VITALS — BP 120/70 | HR 80 | Ht 70.0 in | Wt 253.0 lb

## 2021-10-17 DIAGNOSIS — E1159 Type 2 diabetes mellitus with other circulatory complications: Secondary | ICD-10-CM | POA: Diagnosis not present

## 2021-10-17 DIAGNOSIS — Z794 Long term (current) use of insulin: Secondary | ICD-10-CM

## 2021-10-17 DIAGNOSIS — Z6837 Body mass index (BMI) 37.0-37.9, adult: Secondary | ICD-10-CM | POA: Diagnosis not present

## 2021-10-17 DIAGNOSIS — E785 Hyperlipidemia, unspecified: Secondary | ICD-10-CM

## 2021-10-17 DIAGNOSIS — N183 Chronic kidney disease, stage 3 unspecified: Secondary | ICD-10-CM | POA: Diagnosis not present

## 2021-10-17 DIAGNOSIS — I509 Heart failure, unspecified: Secondary | ICD-10-CM | POA: Diagnosis not present

## 2021-10-17 NOTE — Progress Notes (Signed)
Patient ID: William Hendrix, male   DOB: [birth year], 81 y.o.   MRN: [id number]  This visit occurred during the SARS-CoV-2 public health emergency.  Safety protocols were in place, including screening questions prior to the visit, additional usage of staff PPE, and extensive cleaning of exam room while observing appropriate contact time as indicated for disinfecting solutions.   HPI: William Hendrix is a 82 y.o.-year-old male, returning for f/u for DM2, dx 2009, insulin-dependent, uncontrolled, with complications (CAD s/p AMI 2017, CKD stage 3). Last visit 4.5 months ago.  Interim history: In 2018, he eliminated meat almost completely and his sugars improved significantly while he also lost weight.  He continues to eat many vegetables, but he reintroduced meat, bacon, eggs and sugars started to increase. No increased urination, blurry vision, nausea, chest pain.   He had lumbar fusion surgery on 09/11/2021. He feels better now. He had a blood transfusion then. Before his surgery, his wife was hospitalized for 2 back surgeries and was in house for 6 weeks. He was eating mostly fast foods.  He was then in the hospital himself for his back surgery but now they are both home and started to get back to the previous routine.  Sugars started to improve in the last week.  Reviewed HbA1c levels: Lab Results  Component Value Date   HGBA1C 7.1 (H) 09/09/2021   HGBA1C 6.0 (A) 05/27/2021   HGBA1C 6.7 (A) 01/14/2021   HGBA1C 6.2 (A) 09/10/2020   HGBA1C 6.7 (A) 04/30/2020   HGBA1C 8.0 (H) 11/21/2019   HGBA1C 7.2 (H) 08/14/2019   HGBA1C 6.7 (H) 05/11/2019   HGBA1C 6.3 (A) 04/27/2019   HGBA1C 6.8 (H) 12/15/2018   HGBA1C 6.1 (A) 05/10/2018   HGBA1C 5.7 01/06/2018   HGBA1C 6.3 09/09/2017   HGBA1C 7.3 06/09/2017   HGBA1C 7.5 03/02/2017   HGBA1C 7.7 11/26/2016   HGBA1C 6.5 08/28/2016   HGBA1C 7.0 05/28/2016   HGBA1C 7.8 02/26/2016   HGBA1C 7.9 11/28/2015   He is on: ReliOn insulin:  Insulin Before  breakfast Before lunch Before dinner  Regular '30 30 30  ' NPH 40  40   If the sugars before the meal are: <80: skip the R insulin before that meal 80-100: take only 10 units of R insulin 101-120: take only 15 units of R insulin  Otherwise, take the full 30 units dose.  We cannot use Metformin 2/2 CKD. We stopped Januvia >> could not afford ($100/3 mo)  -We cannot use Levemir >> developed a severe skin reaction We stopped Amaryl 4 mg. - He checks sugars 3 times a day per review of his log: - am: 53, 65-137, 149, 210 (steroid inj) >> 87-154, 172 >> 62, 72-149, 163, 295 - Before lunch: 110-133 >> 96, 106-160 >> 76-143 >> 122-146, 174 - before dinner: 108-203, 220, 326 >> 97-189, 201, 238 >> 69-218, 244 - Before bedtime: 180-201 >> n/c >> 127-173, 207 >> n/c  - nighttime: 44 x1  Lowest sugar was 53 (am) >> 87 >> 44 at night; it is unclear at which level he has hypoglycemia awareness. Highest sugar was 326 >> 238 >> 295.  Meals: - Breakfast: oatmeal + toast + tomatoes - Lunch: beans  - Dinner:home cooked veggies   + CKD, last BUN/creatinine was:  Lab Results  Component Value Date   BUN 39 (H) 09/09/2021   CREATININE 1.94 (H) 09/09/2021   ACR levels were normal: Lab Results  Component Value Date   MICRALBCREAT 5.1 08/02/2014  MICRALBCREAT 0.7 02/23/2013   Reviewed GFR levels: Lab Results  Component Value Date   GFRAA 50 (L) 08/28/2020   GFRAA 48 (L) 01/09/2020   GFRAA 49 (L) 12/13/2019   GFRAA 48 (L) 04/22/2019   GFRAA 44 (L) 12/29/2018   GFRAA 39 (L) 11/12/2018   GFRAA 32 (L) 11/11/2018   GFRAA 44 (L) 09/16/2018   GFRAA 43 (L) 09/15/2018   GFRAA 46 (L) 08/21/2018   + HL; latest lipids: Lab Results  Component Value Date   CHOL 123 06/26/2021   HDL 27.00 (L) 06/26/2021   LDLCALC 58 06/26/2021   LDLDIRECT 57.0 11/21/2019   TRIG 186.0 (H) 06/26/2021   CHOLHDL 5 06/26/2021  On Lipitor, fenofibrate, flaxseed oil.  Pt's last eye exam was 10/2020: No DR reportedly.  Had cataract sx in 2011.  + numbness and tingling in his legs.  He had back surgery in 03/2016 and 10/2017.  He developed a kidney stone after his last surgery. He was admitted 2x in 2017 for: CP/SOB >> AMI.  He had 2 stents placed then. He had a cardiac cath 02/2017 >> no new blockages He was seen emergency room with colitis 04/22/2019.    PMH: Pt also also has a history of CAD, HTN, CKD stage III, history of nephrolithiasis, hiatal hernia, OSA, obesity, melanoma, spinal stenosis.  ROS: + see HPI Musculoskeletal: + muscle aches/+ joint aches (back) Neurological: + tremors/+ numbness/+ tingling/no dizziness  I reviewed pt's medications, allergies, PMH, social hx, family hx, and changes were documented in the history of present illness. Otherwise, unchanged from my initial visit note.  Past Medical History:  Diagnosis Date   AAA (abdominal aortic aneurysm) 12/15/2014   a. Mild aneurysmal dilatation of the infrarenal abdominal aorta 3.2 cm - f/u due by 2019.   Arthritis    "all over" (07/30/2016)   CAD (coronary artery disease)    a. stent to LAD 2000. b. possible spasm by cath 2001. c. IVUS/PTCA/DES to mLAD 05/2013. d. PTCA/DES of dRCA into ostial rPDA 07/2013. e. PTCA of OM2, DES to Cx, DES to [facility] 07/2016.   Chronic bronchitis (HCC)    Chronic diastolic CHF (congestive heart failure) (HCC)    Chronic lower back pain    Chronic neck pain    CKD (chronic kidney disease), stage III (HCC)    Diabetic peripheral neuropathy ([place])    Ejection fraction    55%, 07/2010, mild inferior hypo   GERD (gastroesophageal reflux disease)    Gout    H/O diverticulitis of colon 01/31/2015   H/O hiatal hernia    History of blood transfusion ~ 10/1940   "had pneumonia"   History of kidney stones    HTN (hypertension)    Hyperlipidemia    Melanoma of forearm, left ([place]) 02/2011   with wide excision    Myocardial infarction ([place])    Nephrolithiasis    Obesity    OSA on CPAP     [name] since  2000   Peripheral neuropathy    both feet   Positive D-dimer    a. significant elevation ,hospital 07/2010, etiology unclear. b. D Dimer chronically > 20.   Renal insufficiency    Skin cancer    Spinal stenosis    a. s/p surgical repair 2013   Spinal stenosis of lumbar region    Type II diabetes mellitus ([place])    Ventral hernia    Past Surgical History:  Procedure Laterality Date   ANTERIOR LAT LUMBAR FUSION  10/22/2017  Procedure: Lumbar two-three Lumbar three-four Lumbar four-five Anteriolateral lumbar interbody fusion with percutaneous pedicle screw fixation and infuse;  Surgeon: [name], MD;  Location: [place];  Service: Neurosurgery;;   APPLICATION OF ROBOTIC ASSISTANCE FOR SPINAL PROCEDURE  10/22/2017   Procedure: APPLICATION OF ROBOTIC ASSISTANCE FOR SPINAL PROCEDURE;  Surgeon: [name], MD;  Location: [place];  Service: Neurosurgery;;   BACK SURGERY     CARDIAC CATHETERIZATION N/A 07/30/2016   Procedure: Left Heart Cath and Coronary Angiography;  Surgeon: Nelva Bush, MD;  Location: [place] CV LAB;  Service: Cardiovascular;  Laterality: N/A;   CARDIAC CATHETERIZATION N/A 07/30/2016   Procedure: Coronary Stent Intervention;  Surgeon: Nelva Bush, MD;  Location: [place] CV LAB;  Service: Cardiovascular;  Laterality: N/A;  Mid CFX and MID RCA   CARDIAC CATHETERIZATION N/A 07/30/2016   Procedure: Coronary Balloon Angioplasty;  Surgeon: Nelva Bush, MD;  Location: [place] CV LAB;  Service: Cardiovascular;  Laterality: N/A;  OM 1   CARPAL TUNNEL RELEASE Left    CATARACT EXTRACTION W/ INTRAOCULAR LENS  IMPLANT, BILATERAL Bilateral ~ 2014   CERVICAL DISC SURGERY  1990s   CORONARY ANGIOPLASTY WITH STENT PLACEMENT  05/2013   CORONARY ANGIOPLASTY WITH STENT PLACEMENT  07/26/2013   DES to RCA extending to PDA     CORONARY ANGIOPLASTY WITH STENT PLACEMENT  07/30/2016   CORONARY ANGIOPLASTY WITH STENT PLACEMENT  2000   CAD   DENTAL SURGERY  04/2016   "got  infected; had to dig it out"   EYE SURGERY     KNEE ARTHROSCOPY Right 1990's   rt   LEFT AND RIGHT HEART CATHETERIZATION WITH CORONARY ANGIOGRAM N/A 07/26/2013   Procedure: LEFT AND RIGHT HEART CATHETERIZATION WITH CORONARY ANGIOGRAM;  Surgeon: [name], MD;  Location: [place] CATH LAB;  Service: Cardiovascular;  Laterality: N/A;   LEFT HEART CATH AND CORONARY ANGIOGRAPHY N/A 02/26/2017   Procedure: Left Heart Cath and Coronary Angiography;  Surgeon: Sherren Mocha, MD;  Location: [place] CV LAB;  Service: Cardiovascular;  Laterality: N/A;   LEFT HEART CATHETERIZATION WITH CORONARY ANGIOGRAM N/A 05/19/2013   Procedure: LEFT HEART CATHETERIZATION WITH CORONARY ANGIOGRAM;  Surgeon: [name], MD;  Location: [facility] CATH LAB;  Service: Cardiovascular;  Laterality: N/A;   LUMBAR LAMINECTOMY/DECOMPRESSION MICRODISCECTOMY  08/30/2012   Procedure: LUMBAR LAMINECTOMY/DECOMPRESSION MICRODISCECTOMY 2 LEVELS;  Surgeon: [name], MD;  Location: [place] NEURO ORS;  Service: Neurosurgery;  Laterality: Bilateral;  Bilateral Lumbar three-four Lumbar four-five Laminotomies   LUMBAR PERCUTANEOUS PEDICLE SCREW 1 LEVEL Bilateral 10/22/2017   Procedure: LUMBAR PERCUTANEOUS PEDICLE SCREW 1 LEVEL;  Surgeon: [name], MD;  Location: [facility];  Service: Neurosurgery;  Laterality: Bilateral;   LUMBAR SPINE SURGERY  04/07/2016   Dr. Ellene Route; "?ruptured disc"   MELANOMA EXCISION Left 02/2011   forearm   NASAL SINUS SURGERY  1970s   "cut windows in sinus pockets"   PERCUTANEOUS CORONARY STENT INTERVENTION (PCI-S)  05/19/2013   Procedure: PERCUTANEOUS CORONARY STENT INTERVENTION (PCI-S);  Surgeon: [name], MD;  Location: [facility] CATH LAB;  Service: Cardiovascular;;   ULNAR TUNNEL RELEASE Left 04/07/2016   Dr. Ellene Route   Social History   Socioeconomic History   Marital status: Married    Spouse name: Not on file   Number of children: Not on file   Years of education: Not on file   Highest education level: Not  on file  Occupational History   Occupation: Painter  Tobacco Use   Smoking status: Former  Packs/day: 3.00    Years: 30.00    Pack years: 90.00    Types: Cigarettes    Quit date: 09/17/1996    Years since quitting: 25.0   Smokeless tobacco: Never  Vaping Use   Vaping Use: Never used  Substance and Sexual Activity   Alcohol use: No   Drug use: No   Sexual activity: Not on file  Other Topics Concern   Not on file  Social History Narrative   Married and lives locally with his wife.  Sharyon Cable.   Social Determinants of Health   Financial Resource Strain: Low Risk    Difficulty of Paying Living Expenses: Not hard at all  Food Insecurity: Not on file  Transportation Needs: No Transportation Needs   Lack of Transportation (Medical): No   Lack of Transportation (Non-Medical): No  Physical Activity: Inactive   Days of Exercise per Week: 0 days   Minutes of Exercise per Session: 0 min  Stress: Not on file  Social Connections: Not on file  Intimate Partner Violence: Not on file   Current Outpatient Medications on File Prior to Visit  Medication Sig Dispense Refill   acetaminophen (TYLENOL) 500 MG tablet Take 500 mg by mouth 2 (two) times daily.      alfuzosin (UROXATRAL) 10 MG 24 hr tablet Take 10 mg by mouth in the morning and at bedtime.     allopurinol (ZYLOPRIM) 300 MG tablet TAKE 1 TABLET EVERY DAY 90 tablet 1   amLODipine (NORVASC) 5 MG tablet Take 1 tablet (5 mg total) by mouth daily. (Patient taking differently: Take 5 mg by mouth at bedtime.) 90 tablet 3   aspirin EC 81 MG tablet Take 81 mg by mouth daily.     atorvastatin (LIPITOR) 40 MG tablet Take 1 tablet (40 mg total) by mouth daily. 90 tablet 3   B Complex Vitamins (B COMPLEX-B12 PO) Take 1 tablet by mouth daily.      baclofen (LIORESAL) 10 MG tablet Take 1-2 tablets (10-20 mg total) by mouth at bedtime as needed for muscle spasms. (Patient taking differently: Take 10 mg by mouth at bedtime.) 60 each 1   Blood  Glucose Monitoring Suppl (TRUE METRIX METER) w/Device KIT USE AS DIRECTED  TO TEST BLOOD GLUCOSE 1 kit 0   Chlorpheniramine Maleate (CHLOR-TRIMETON ALLERGY PO) Take 1 tablet by mouth in the morning and at bedtime.     Cholecalciferol (VITAMIN D3) 50 MCG (2000 UT) capsule Take 2,000 Units by mouth at bedtime.      DROPLET INSULIN SYRINGE 31G X 5/16" 1 ML MISC USE TO INJECT INSULIN 5 TIMES DAILY 500 each 0   fenofibrate 160 MG tablet TAKE 1 TABLET AT BEDTIME 90 tablet 1   fluocinonide cream (LIDEX) 9.76 % Apply 1 application topically daily as needed (for rashes).      fluticasone (FLONASE) 50 MCG/ACT nasal spray Place 2 sprays into both nostrils daily. (Patient taking differently: Place 2 sprays into both nostrils daily as needed for allergies.) 16 g 1   furosemide (LASIX) 40 MG tablet TAKE 2 TABLETS (80 MG) DAILY. 180 tablet 3   HYDROcodone-acetaminophen (NORCO) 5-325 MG tablet Take 0.5-1 tablets by mouth 2 (two) times daily as needed for moderate pain. (Patient taking differently: Take 0.5 tablets by mouth at bedtime.) 60 tablet 0   insulin NPH Human (NOVOLIN N) 100 UNIT/ML injection Inject 0.5 mLs (50 Units total) into the skin 2 (two) times daily before a meal. (Patient taking differently: Inject 40 Units into  the skin 2 (two) times daily before a meal.) 10 mL 11   insulin regular (NOVOLIN R RELION) 100 units/mL injection Inject 25-30 units 3 times a day before meals. (Patient taking differently: Inject 30 Units into the skin 3 (three) times daily before meals.) 60 mL 1   Insulin Syringe-Needle U-100 (BD INSULIN SYRINGE U/F) 31G X 5/16" 0.3 ML MISC Use to inject insulin 5 times a day. 500 each 11   isosorbide mononitrate (IMDUR) 30 MG 24 hr tablet TAKE 1 TABLET (30 MG TOTAL) BY MOUTH DAILY. 90 tablet 0   MAGNESIUM PO Take 1 tablet by mouth at bedtime.     Melatonin 10 MG TABS Take 10 mg by mouth at bedtime.     methocarbamol (ROBAXIN) 500 MG tablet Take 1 tablet (500 mg total) by mouth every 6  (six) hours as needed for muscle spasms. 30 tablet 3   metoprolol tartrate (LOPRESSOR) 25 MG tablet TAKE 1 TABLET TWICE DAILY (Patient not taking: Reported on 09/02/2021) 180 tablet 1   metoprolol tartrate (LOPRESSOR) 50 MG tablet Take 25 mg by mouth 2 (two) times daily.     nitroGLYCERIN (NITROSTAT) 0.4 MG SL tablet Place 1 tablet (0.4 mg total) under the tongue every 5 (five) minutes as needed for chest pain. 100 tablet 3   oxyCODONE-acetaminophen (PERCOCET/ROXICET) 5-325 MG tablet Take 1-2 tablets by mouth every 4 (four) hours as needed for moderate pain or severe pain. 60 tablet 0   pantoprazole (PROTONIX) 20 MG tablet TAKE 1 TABLET EVERY DAY 90 tablet 3   Probiotic Product (PROBIOTIC PO) Take 1 capsule by mouth at bedtime.      traMADol (ULTRAM) 50 MG tablet TAKE 1 TABLET BY MOUTH TWICE DAILY AS NEEDED FOR  MODERATE  OR  SEVERE  PAIN. 60 tablet 0   TRUE METRIX BLOOD GLUCOSE TEST test strip TEST BLOOD SUGAR THREE TIMES DAILY AS DIRECTED 300 strip 11   TRUEplus Lancets 33G MISC USE TO CHECK BLOOD SUGAR 3 TIMES A DAY 300 each 3   No current facility-administered medications on file prior to visit.   Allergies  Allergen Reactions   Brilinta [Ticagrelor] Shortness Of Breath   Insulin Detemir Swelling, Other (See Comments) and Itching    Patient had redness and swelling and tenderness at injection site. Patient had redness and swelling and tenderness at injection site.   Bee Venom    Codeine Other (See Comments)    Makes him "shakey", is OK with hydrocodone GI UPSET & TREMORS Makes him "shakey", is OK with hydrocodone Makes him "shakey", is OK with hydrocodone GI UPSET & TREMORS Other reaction(s): GI issues   Gadobenate     Other reaction(s): fever, sweats and nausea   Insulin Aspart    Lipitor [Atorvastatin] Other (See Comments)    Muscle aches; affects liver function- Patient is currently taking   Novolog Mix [Insulin Aspart Prot & Aspart] Other (See Comments)    Causes skin to  swell/itch at injection site   Family History  Problem Relation Age of Onset   Cancer Mother        intestinal    Heart disease Mother    Cancer Father        prostate   Migraines Daughter    Leukemia Sister    Heart disease Sister    Prostate cancer Other    Kidney cancer Other    Cancer Other        Bladder cancer   Coronary artery disease Other  Hypertension Sister    Hypertension Brother    Heart attack Neg Hx    Stroke Neg Hx     PE: BP 120/70 (BP Location: Right Arm, Patient Position: Sitting, Cuff Size: Normal)    Pulse 80    Ht '5\' 10"'  (1.778 m)    Wt 253 lb (114.8 kg)    SpO2 99%    BMI 36.30 kg/m   Wt Readings from Last 3 Encounters:  10/17/21 253 lb (114.8 kg)  09/11/21 244 lb (110.7 kg)  09/09/21 243 lb (110.2 kg)   Constitutional: overweight, in NAD Eyes: PERRLA, EOMI, no exophthalmos ENT: moist mucous membranes, no thyromegaly, no cervical lymphadenopathy Cardiovascular: RRR, No MRG Respiratory: CTA B Musculoskeletal: no deformities, strength intact in all 4 Skin: moist, warm, no rashes Neurological: + tremor with outstretched hands, DTR normal in all 4  ASSESSMENT: 1. DM2, insulin-dependent, uncontrolled, with complications - CAD, s/p stents - He had stenting of distal RCA/PDA in 07/2013. Prior stenting of LAD in 04/2013. 2 new stents: PCI to left circumflex and the mid RCA on 07/30/2016. - AAA - CKD stage 3  2. HL  3.  Obesity class II BMI Classification: < 18.5 underweight  18.5-24.9 normal weight  25.0-29.9 overweight  30.0-34.9 class I obesity  35.0-39.9 class II obesity  ? 40.0 class III obesity   PLAN:  1. Patient with history of uncontrolled type 2 diabetes, on basal-bolus insulin regimen, with improved control in the last 2 years after improving his diet.  At last visit, HbA1c was lower, at 6.0% and we did not change his regimen at that time, however, he had another HbA1c 1.5 months ago and this was higher, at 7.1%. -At today's visit,  we reviewed his blood sugars at home and they appear to be worse around the time of his surgery and then while on antibiotics for an infection.  However, after he came home, sugar now started to improve.  He had a low blood sugar overnight, and 44, after he took the full dose of regular insulin and ate a smaller meal.  We discussed that for each of his meals, if he eats smaller meals, he needs to decrease the dose of regular insulin.  For now, I would suggest to continue the same dose of NPH but I did advise him to let me know if he has more low blood sugars before next visit, in which case, we may need to decrease the dose of this insulin, to - I advised him to: Patient Instructions  Please change: Insulin Before breakfast Before lunch Before dinner  Regular 25-30 25-30 25-30  NPH 40  40   If the sugars before the meal are: <80: skip the R insulin before that meal 80-100: take only 10 units of R insulin 101-120: take only 15 units of R insulin  Otherwise, take the full 30 units dose.  If you take dinnertime insulin after dinner, take 50% of the dose.  Please return in 4 months with your sugar log.   - advised to check sugars at different times of the day - 4x a day, rotating check times - advised for yearly eye exams >> he is UTD - return to clinic in 4 months  2. HL -Reviewed latest lipid panel from 06/2021: Triglycerides high, HDL low, LDL at goal: Lab Results  Component Value Date   CHOL 123 06/26/2021   HDL 27.00 (L) 06/26/2021   LDLCALC 58 06/26/2021   LDLDIRECT 57.0 11/21/2019   TRIG  186.0 (H) 06/26/2021   CHOLHDL 5 06/26/2021  -He continues on Lipitor 40 mg daily, fenofibrate 160 mg daily, flaxseed oil-without side effects  3.  Obesity class 2 -His weight started to increase after he relaxed his diet and introduced meat to his more plant-based diet.  He still continues to eat plenty of vegetables and fruit -He gained approximately10 pounds since last visit...  [name], MD PhD [facility] Endocrinology

## 2021-10-17 NOTE — Patient Instructions (Addendum)
Please change: Insulin Before breakfast Before lunch Before dinner  Regular 25-30 25-30 25-30  NPH 40  40   If the sugars before the meal are: <80: skip the R insulin before that meal 80-100: take only 10 units of R insulin 101-120: take only 15 units of R insulin  Otherwise, take the full 30 units dose.  If you take dinnertime insulin after dinner, take 50% of the dose.  Please return in 4 months with your sugar log.

## 2021-10-27 ENCOUNTER — Ambulatory Visit (INDEPENDENT_AMBULATORY_CARE_PROVIDER_SITE_OTHER): Payer: Medicare HMO | Admitting: Family Medicine

## 2021-10-27 ENCOUNTER — Other Ambulatory Visit: Payer: Self-pay

## 2021-10-27 ENCOUNTER — Encounter: Payer: Self-pay | Admitting: Family Medicine

## 2021-10-27 ENCOUNTER — Ambulatory Visit (HOSPITAL_BASED_OUTPATIENT_CLINIC_OR_DEPARTMENT_OTHER)
Admission: RE | Admit: 2021-10-27 | Discharge: 2021-10-27 | Disposition: A | Discharge status: Home / Self Care | Payer: Medicare HMO | Source: Ambulatory Visit | Attending: Family Medicine | Admitting: Family Medicine

## 2021-10-27 VITALS — BP 132/80 | HR 70 | Temp 97.9°F | Resp 16 | Ht 70.0 in | Wt 233.5 lb

## 2021-10-27 DIAGNOSIS — I10 Essential (primary) hypertension: Secondary | ICD-10-CM | POA: Diagnosis not present

## 2021-10-27 DIAGNOSIS — E1165 Type 2 diabetes mellitus with hyperglycemia: Secondary | ICD-10-CM

## 2021-10-27 DIAGNOSIS — T300 Burn of unspecified body region, unspecified degree: Secondary | ICD-10-CM | POA: Diagnosis not present

## 2021-10-27 DIAGNOSIS — R059 Cough, unspecified: Secondary | ICD-10-CM

## 2021-10-27 DIAGNOSIS — N138 Other obstructive and reflux uropathy: Secondary | ICD-10-CM

## 2021-10-27 DIAGNOSIS — E785 Hyperlipidemia, unspecified: Secondary | ICD-10-CM | POA: Diagnosis not present

## 2021-10-27 DIAGNOSIS — Z794 Long term (current) use of insulin: Secondary | ICD-10-CM

## 2021-10-27 DIAGNOSIS — N401 Enlarged prostate with lower urinary tract symptoms: Secondary | ICD-10-CM | POA: Diagnosis not present

## 2021-10-27 DIAGNOSIS — N289 Disorder of kidney and ureter, unspecified: Secondary | ICD-10-CM | POA: Diagnosis not present

## 2021-10-27 DIAGNOSIS — E118 Type 2 diabetes mellitus with unspecified complications: Secondary | ICD-10-CM

## 2021-10-27 DIAGNOSIS — M5416 Radiculopathy, lumbar region: Secondary | ICD-10-CM

## 2021-10-27 DIAGNOSIS — X12XXXA Contact with other hot fluids, initial encounter: Secondary | ICD-10-CM

## 2021-10-27 DIAGNOSIS — M1000 Idiopathic gout, unspecified site: Secondary | ICD-10-CM

## 2021-10-27 DIAGNOSIS — E781 Pure hyperglyceridemia: Secondary | ICD-10-CM

## 2021-10-27 DIAGNOSIS — M48062 Spinal stenosis, lumbar region with neurogenic claudication: Secondary | ICD-10-CM

## 2021-10-27 LAB — CBC
HCT: 33.9 % — ABNORMAL LOW (ref 39.0–52.0)
Hemoglobin: 10.9 g/dL — ABNORMAL LOW (ref 13.0–17.0)
MCHC: 32.1 g/dL (ref 30.0–36.0)
MCV: 90.5 fl (ref 78.0–100.0)
Platelets: 221 10*3/uL (ref 150.0–400.0)
RBC: 3.75 Mil/uL — ABNORMAL LOW (ref 4.22–5.81)
RDW: 16.6 % — ABNORMAL HIGH (ref 11.5–15.5)
WBC: 6.3 10*3/uL (ref 4.0–10.5)

## 2021-10-27 LAB — LIPID PANEL
Cholesterol: 108 mg/dL (ref 0–200)
HDL: 23.3 mg/dL — ABNORMAL LOW (ref >39.00)
LDL Cholesterol: 45 mg/dL (ref 0–99)
NonHDL: 84.55
Total CHOL/HDL Ratio: 5
Triglycerides: 199 mg/dL — ABNORMAL HIGH (ref 0.0–149.0)
VLDL: 39.8 mg/dL (ref 0.0–40.0)

## 2021-10-27 LAB — COMPREHENSIVE METABOLIC PANEL
ALT: 12 U/L (ref 0–53)
AST: 16 U/L (ref 0–37)
Albumin: 4 g/dL (ref 3.5–5.2)
Alkaline Phosphatase: 74 U/L (ref 39–117)
BUN: 20 mg/dL (ref 6–23)
CO2: 29 mEq/L (ref 19–32)
Calcium: 10 mg/dL (ref 8.4–10.5)
Chloride: 102 mEq/L (ref 96–112)
Creatinine, Ser: 1.43 mg/dL (ref 0.40–1.50)
GFR: 45.91 mL/min — ABNORMAL LOW (ref >60.00)
Glucose, Bld: 194 mg/dL — ABNORMAL HIGH (ref 70–99)
Potassium: 4.5 mEq/L (ref 3.5–5.1)
Sodium: 140 mEq/L (ref 135–145)
Total Bilirubin: 0.4 mg/dL (ref 0.2–1.2)
Total Protein: 7.1 g/dL (ref 6.0–8.3)

## 2021-10-27 LAB — MICROALBUMIN / CREATININE URINE RATIO
Creatinine,U: 39.9 mg/dL
Microalb Creat Ratio: 1.8 mg/g (ref 0.0–30.0)
Microalb, Ur: <0.7 mg/dL (ref 0.0–1.9)

## 2021-10-27 LAB — TSH: TSH: 3.43 u[IU]/mL (ref 0.35–5.50)

## 2021-10-27 LAB — URIC ACID: Uric Acid, Serum: 3.6 mg/dL — ABNORMAL LOW (ref 4.0–7.8)

## 2021-10-27 NOTE — Patient Instructions (Addendum)
Try roll on Lidocaine from [employer] for the hip pain    Hypertension, Adult High blood pressure (hypertension) is when the force of blood pumping through the arteries is too strong. The arteries are the blood vessels that carry blood from the heart throughout the body. Hypertension forces the heart to work harder to pump blood and may cause arteries to become narrow or stiff. Untreated or uncontrolled hypertension can cause a heart attack, heart failure, a stroke, kidney disease, and other problems. A blood pressure reading consists of a higher number over a lower number. Ideally, your blood pressure should be below 120/80. The first ("top") number is called the systolic pressure. It is a measure of the pressure in your arteries as your heart beats. The second ("bottom") number is called the diastolic pressure. It is a measure of the pressure in your arteries as the heart relaxes. What are the causes? The exact cause of this condition is not known. There are some conditions that result in or are related to high blood pressure. What increases the risk? Some risk factors for high blood pressure are under your control. The following factors may make you more likely to develop this condition: Smoking. Having type 2 diabetes mellitus, high cholesterol, or both. Not getting enough exercise or physical activity. Being overweight. Having too much fat, sugar, calories, or salt (sodium) in your diet. Drinking too much alcohol. Some risk factors for high blood pressure may be difficult or impossible to change. Some of these factors include: Having chronic kidney disease. Having a family history of high blood pressure. Age. Risk increases with age. Race. You may be at higher risk if you are African American. Gender. Men are at higher risk than women before age 32. After age 76, women are at higher risk than men. Having obstructive sleep apnea. Stress. What are the signs or symptoms? High blood  pressure may not cause symptoms. Very high blood pressure (hypertensive crisis) may cause: Headache. Anxiety. Shortness of breath. Nosebleed. Nausea and vomiting. Vision changes. Severe chest pain. Seizures. How is this diagnosed? This condition is diagnosed by measuring your blood pressure while you are seated, with your arm resting on a flat surface, your legs uncrossed, and your feet flat on the floor. The cuff of the blood pressure monitor will be placed directly against the skin of your upper arm at the level of your heart. It should be measured at least twice using the same arm. Certain conditions can cause a difference in blood pressure between your right and left arms. Certain factors can cause blood pressure readings to be lower or higher than normal for a short period of time: When your blood pressure is higher when you are in a health care provider's office than when you are at home, this is called white coat hypertension. Most people with this condition do not need medicines. When your blood pressure is higher at home than when you are in a health care provider's office, this is called masked hypertension. Most people with this condition may need medicines to control blood pressure. If you have a high blood pressure reading during one visit or you have normal blood pressure with other risk factors, you may be asked to: Return on a different day to have your blood pressure checked again. Monitor your blood pressure at home for 1 week or longer. If you are diagnosed with hypertension, you may have other blood or imaging tests to help your health care provider understand your overall risk  for other conditions. How is this treated? This condition is treated by making healthy lifestyle changes, such as eating healthy foods, exercising more, and reducing your alcohol intake. Your health care provider may prescribe medicine if lifestyle changes are not enough to get your blood pressure under  control, and if: Your systolic blood pressure is above 130. Your diastolic blood pressure is above 80. Your personal target blood pressure may vary depending on your medical conditions, your age, and other factors. Follow these instructions at home: Eating and drinking  Eat a diet that is high in fiber and potassium, and low in sodium, added sugar, and fat. An example eating plan is called the DASH (Dietary Approaches to Stop Hypertension) diet. To eat this way: Eat plenty of fresh fruits and vegetables. Try to fill one half of your plate at each meal with fruits and vegetables. Eat whole grains, such as whole-wheat pasta, brown rice, or whole-grain bread. Fill about one fourth of your plate with whole grains. Eat or drink low-fat dairy products, such as skim milk or low-fat yogurt. Avoid fatty cuts of meat, processed or cured meats, and poultry with skin. Fill about one fourth of your plate with lean proteins, such as fish, chicken without skin, beans, eggs, or tofu. Avoid pre-made and processed foods. These tend to be higher in sodium, added sugar, and fat. Reduce your daily sodium intake. Most people with hypertension should eat less than 1,500 mg of sodium a day. Do not drink alcohol if: Your health care provider tells you not to drink. You are pregnant, may be pregnant, or are planning to become pregnant. If you drink alcohol: Limit how much you use to: 0-1 drink a day for women. 0-2 drinks a day for men. Be aware of how much alcohol is in your drink. In the [place] one drink equals one 12 oz bottle of beer (355 mL), one 5 oz glass of wine (148 mL), or one 1 oz glass of hard liquor (44 mL). Lifestyle  Work with your health care provider to maintain a healthy body weight or to lose weight. Ask what an ideal weight is for you. Get at least 30 minutes of exercise most days of the week. Activities may include walking, swimming, or biking. Include exercise to strengthen your muscles  (resistance exercise), such as Pilates or lifting weights, as part of your weekly exercise routine. Try to do these types of exercises for 30 minutes at least 3 days a week. Do not use any products that contain nicotine or tobacco, such as cigarettes, e-cigarettes, and chewing tobacco. If you need help quitting, ask your health care provider. Monitor your blood pressure at home as told by your health care provider. Keep all follow-up visits as told by your health care provider. This is important. Medicines Take over-the-counter and prescription medicines only as told by your health care provider. Follow directions carefully. Blood pressure medicines must be taken as prescribed. Do not skip doses of blood pressure medicine. Doing this puts you at risk for problems and can make the medicine less effective. Ask your health care provider about side effects or reactions to medicines that you should watch for. Contact a health care provider if you: Think you are having a reaction to a medicine you are taking. Have headaches that keep coming back (recurring). Feel dizzy. Have swelling in your ankles. Have trouble with your vision. Get help right away if you: Develop a severe headache or confusion. Have unusual weakness or numbness. Feel  faint. Have severe pain in your chest or abdomen. Vomit repeatedly. Have trouble breathing. Summary Hypertension is when the force of blood pumping through your arteries is too strong. If this condition is not controlled, it may put you at risk for serious complications. Your personal target blood pressure may vary depending on your medical conditions, your age, and other factors. For most people, a normal blood pressure is less than 120/80. Hypertension is treated with lifestyle changes, medicines, or a combination of both. Lifestyle changes include losing weight, eating a healthy, low-sodium diet, exercising more, and limiting alcohol. This information is not  intended to replace advice given to you by your health care provider. Make sure you discuss any questions you have with your health care provider. Document Revised: 06/08/2018 Document Reviewed: 06/08/2018 [employer] Patient Education  [employer].

## 2021-10-27 NOTE — Assessment & Plan Note (Signed)
Still has a spot that can open up with any minor trauma encouraged to apply neosporin and bandage when he is out.

## 2021-10-27 NOTE — Assessment & Plan Note (Signed)
Well controlled, no changes to meds. Encouraged heart healthy diet such as the DASH diet and exercise as tolerated.  °

## 2021-10-27 NOTE — Assessment & Plan Note (Signed)
Hydrate and monitor 

## 2021-10-27 NOTE — Progress Notes (Signed)
Subjective:    Patient ID: William Hendrix, male    DOB: 1941/08/04, 81 y.o.   MRN: [id number]  Chief Complaint  Patient presents with   4 months follow up    HPI Patient is in today for follow upon chronic medical concerns. No recent febrile illness or hospitalizations. He had surgery on 09/11/2022 at L5S1 with significant hardware left in place. He feels much better. Less pain and more function noted. He no longer needs a walker. He has noted some recent sinuc congestion and cough. No fevers, chills or malaise. Denies CP/palp/HAfevers/GI or GU c/o. Taking meds as prescribed   Past Medical History:  Diagnosis Date   AAA (abdominal aortic aneurysm) 12/15/2014   a. Mild aneurysmal dilatation of the infrarenal abdominal aorta 3.2 cm - f/u due by 2019.   Arthritis    "all over" (07/30/2016)   CAD (coronary artery disease)    a. stent to LAD 2000. b. possible spasm by cath 2001. c. IVUS/PTCA/DES to mLAD 05/2013. d. PTCA/DES of dRCA into ostial rPDA 07/2013. e. PTCA of OM2, DES to Cx, DES to [facility] 07/2016.   Chronic bronchitis (HCC)    Chronic diastolic CHF (congestive heart failure) (HCC)    Chronic lower back pain    Chronic neck pain    CKD (chronic kidney disease), stage III (HCC)    Diabetic peripheral neuropathy ([place])    Ejection fraction    55%, 07/2010, mild inferior hypo   GERD (gastroesophageal reflux disease)    Gout    H/O diverticulitis of colon 01/31/2015   H/O hiatal hernia    History of blood transfusion ~ 10/1940   "had pneumonia"   History of kidney stones    HTN (hypertension)    Hyperlipidemia    Melanoma of forearm, left ([place]) 02/2011   with wide excision    Myocardial infarction ([place])    Nephrolithiasis    Obesity    OSA on CPAP     [name] since 2000   Peripheral neuropathy    both feet   Positive D-dimer    a. significant elevation ,hospital 07/2010, etiology unclear. b. D Dimer chronically > 20.   Renal insufficiency    Skin cancer    Spinal stenosis     a. s/p surgical repair 2013   Spinal stenosis of lumbar region    Type II diabetes mellitus ([place])    Ventral hernia     Past Surgical History:  Procedure Laterality Date   ANTERIOR LAT LUMBAR FUSION  10/22/2017   Procedure: Lumbar two-three Lumbar three-four Lumbar four-five Anteriolateral lumbar interbody fusion with percutaneous pedicle screw fixation and infuse;  Surgeon: [name], MD;  Location: [place];  Service: Neurosurgery;;   APPLICATION OF ROBOTIC ASSISTANCE FOR SPINAL PROCEDURE  10/22/2017   Procedure: APPLICATION OF ROBOTIC ASSISTANCE FOR SPINAL PROCEDURE;  Surgeon: [name], MD;  Location: [place];  Service: Neurosurgery;;   BACK SURGERY     CARDIAC CATHETERIZATION N/A 07/30/2016   Procedure: Left Heart Cath and Coronary Angiography;  Surgeon: Nelva Bush, MD;  Location: [place] CV LAB;  Service: Cardiovascular;  Laterality: N/A;   CARDIAC CATHETERIZATION N/A 07/30/2016   Procedure: Coronary Stent Intervention;  Surgeon: Nelva Bush, MD;  Location: [place] CV LAB;  Service: Cardiovascular;  Laterality: N/A;  Mid CFX and MID RCA   CARDIAC CATHETERIZATION N/A 07/30/2016   Procedure: Coronary Balloon Angioplasty;  Surgeon: Nelva Bush, MD;  Location: [place] CV LAB;  Service: Cardiovascular;  Laterality: N/A;  OM 1   CARPAL TUNNEL RELEASE Left    CATARACT EXTRACTION W/ INTRAOCULAR LENS  IMPLANT, BILATERAL Bilateral ~ 2014   CERVICAL DISC SURGERY  1990s   CORONARY ANGIOPLASTY WITH STENT PLACEMENT  05/2013   CORONARY ANGIOPLASTY WITH STENT PLACEMENT  07/26/2013   DES to RCA extending to PDA     CORONARY ANGIOPLASTY WITH STENT PLACEMENT  07/30/2016   CORONARY ANGIOPLASTY WITH STENT PLACEMENT  2000   CAD   DENTAL SURGERY  04/2016   "got infected; had to dig it out"   EYE SURGERY     KNEE ARTHROSCOPY Right 1990's   rt   LEFT AND RIGHT HEART CATHETERIZATION WITH CORONARY ANGIOGRAM N/A 07/26/2013   Procedure: LEFT AND RIGHT HEART CATHETERIZATION  WITH CORONARY ANGIOGRAM;  Surgeon: [name], MD;  Location: [place] CATH LAB;  Service: Cardiovascular;  Laterality: N/A;   LEFT HEART CATH AND CORONARY ANGIOGRAPHY N/A 02/26/2017   Procedure: Left Heart Cath and Coronary Angiography;  Surgeon: Sherren Mocha, MD;  Location: [place] CV LAB;  Service: Cardiovascular;  Laterality: N/A;   LEFT HEART CATHETERIZATION WITH CORONARY ANGIOGRAM N/A 05/19/2013   Procedure: LEFT HEART CATHETERIZATION WITH CORONARY ANGIOGRAM;  Surgeon: [name], MD;  Location: [facility] CATH LAB;  Service: Cardiovascular;  Laterality: N/A;   LUMBAR LAMINECTOMY/DECOMPRESSION MICRODISCECTOMY  08/30/2012   Procedure: LUMBAR LAMINECTOMY/DECOMPRESSION MICRODISCECTOMY 2 LEVELS;  Surgeon: [name], MD;  Location: [place] NEURO ORS;  Service: Neurosurgery;  Laterality: Bilateral;  Bilateral Lumbar three-four Lumbar four-five Laminotomies   LUMBAR PERCUTANEOUS PEDICLE SCREW 1 LEVEL Bilateral 10/22/2017   Procedure: LUMBAR PERCUTANEOUS PEDICLE SCREW 1 LEVEL;  Surgeon: [name], MD;  Location: [place];  Service: Neurosurgery;  Laterality: Bilateral;   LUMBAR SPINE SURGERY  04/07/2016   Dr. Ellene Route; "?ruptured disc"   MELANOMA EXCISION Left 02/2011   forearm   NASAL SINUS SURGERY  1970s   "cut windows in sinus pockets"   PERCUTANEOUS CORONARY STENT INTERVENTION (PCI-S)  05/19/2013   Procedure: PERCUTANEOUS CORONARY STENT INTERVENTION (PCI-S);  Surgeon: [name], MD;  Location: [facility] CATH LAB;  Service: Cardiovascular;;   ULNAR TUNNEL RELEASE Left 04/07/2016   Dr. Ellene Route    Family History  Problem Relation Age of Onset   Cancer Mother        intestinal    Heart disease Mother    Cancer Father        prostate   Migraines Daughter    Leukemia Sister    Heart disease Sister    Prostate cancer Other    Kidney cancer Other    Cancer Other        Bladder cancer   Coronary artery disease Other    Hypertension Sister    Hypertension Brother    Heart attack Neg Hx     Stroke Neg Hx     Social History   Socioeconomic History   Marital status: Married    Spouse name: Not on file   Number of children: Not on file   Years of education: Not on file   Highest education level: Not on file  Occupational History   Occupation: Curator  Tobacco Use   Smoking status: Former    Packs/day: 3.00    Years: 30.00    Pack years: 90.00    Types: Cigarettes    Quit date: 09/17/1996    Years since quitting: 25.1   Smokeless tobacco: Never  Vaping Use   Vaping Use: Never used  Substance and  Sexual Activity   Alcohol use: No   Drug use: No   Sexual activity: Not on file  Other Topics Concern   Not on file  Social History Narrative   Married and lives locally with his wife.  Sharyon Cable.   Social Determinants of Health   Financial Resource Strain: Low Risk    Difficulty of Paying Living Expenses: Not hard at all  Food Insecurity: Not on file  Transportation Needs: No Transportation Needs   Lack of Transportation (Medical): No   Lack of Transportation (Non-Medical): No  Physical Activity: Inactive   Days of Exercise per Week: 0 days   Minutes of Exercise per Session: 0 min  Stress: Not on file  Social Connections: Not on file  Intimate Partner Violence: Not on file    Outpatient Medications Prior to Visit  Medication Sig Dispense Refill   acetaminophen (TYLENOL) 500 MG tablet Take 500 mg by mouth 2 (two) times daily.      alfuzosin (UROXATRAL) 10 MG 24 hr tablet Take 10 mg by mouth in the morning and at bedtime.     allopurinol (ZYLOPRIM) 300 MG tablet TAKE 1 TABLET EVERY DAY 90 tablet 1   amLODipine (NORVASC) 5 MG tablet Take 1 tablet (5 mg total) by mouth daily. (Patient taking differently: Take 5 mg by mouth at bedtime.) 90 tablet 3   aspirin EC 81 MG tablet Take 81 mg by mouth daily.     B Complex Vitamins (B COMPLEX-B12 PO) Take 1 tablet by mouth daily.      baclofen (LIORESAL) 10 MG tablet Take 1-2 tablets (10-20 mg total) by mouth at bedtime as  needed for muscle spasms. (Patient taking differently: Take 10 mg by mouth at bedtime.) 60 each 1   Blood Glucose Monitoring Suppl (TRUE METRIX METER) w/Device KIT USE AS DIRECTED  TO TEST BLOOD GLUCOSE 1 kit 0   Chlorpheniramine Maleate (CHLOR-TRIMETON ALLERGY PO) Take 1 tablet by mouth in the morning and at bedtime.     Cholecalciferol (VITAMIN D3) 50 MCG (2000 UT) capsule Take 2,000 Units by mouth at bedtime.      DROPLET INSULIN SYRINGE 31G X 5/16" 1 ML MISC USE TO INJECT INSULIN 5 TIMES DAILY 500 each 0   fenofibrate 160 MG tablet TAKE 1 TABLET AT BEDTIME 90 tablet 1   fluocinonide cream (LIDEX) 0.17 % Apply 1 application topically daily as needed (for rashes).      fluticasone (FLONASE) 50 MCG/ACT nasal spray Place 2 sprays into both nostrils daily. (Patient taking differently: Place 2 sprays into both nostrils daily as needed for allergies.) 16 g 1   furosemide (LASIX) 40 MG tablet TAKE 2 TABLETS (80 MG) DAILY. 180 tablet 3   HYDROcodone-acetaminophen (NORCO) 5-325 MG tablet Take 0.5-1 tablets by mouth 2 (two) times daily as needed for moderate pain. (Patient taking differently: Take 0.5 tablets by mouth at bedtime.) 60 tablet 0   insulin NPH Human (NOVOLIN N) 100 UNIT/ML injection Inject 0.5 mLs (50 Units total) into the skin 2 (two) times daily before a meal. (Patient taking differently: Inject 40 Units into the skin 2 (two) times daily before a meal.) 10 mL 11   insulin regular (NOVOLIN R RELION) 100 units/mL injection Inject 25-30 units 3 times a day before meals. (Patient taking differently: Inject 30 Units into the skin 3 (three) times daily before meals.) 60 mL 1   Insulin Syringe-Needle U-100 (BD INSULIN SYRINGE U/F) 31G X 5/16" 0.3 ML MISC Use to inject insulin 5  times a day. 500 each 11   isosorbide mononitrate (IMDUR) 30 MG 24 hr tablet TAKE 1 TABLET (30 MG TOTAL) BY MOUTH DAILY. 90 tablet 0   MAGNESIUM PO Take 1 tablet by mouth at bedtime.     Melatonin 10 MG TABS Take 10 mg by  mouth at bedtime.     methocarbamol (ROBAXIN) 500 MG tablet Take 1 tablet (500 mg total) by mouth every 6 (six) hours as needed for muscle spasms. 30 tablet 3   metoprolol tartrate (LOPRESSOR) 25 MG tablet TAKE 1 TABLET TWICE DAILY 180 tablet 1   nitroGLYCERIN (NITROSTAT) 0.4 MG SL tablet Place 1 tablet (0.4 mg total) under the tongue every 5 (five) minutes as needed for chest pain. 100 tablet 3   pantoprazole (PROTONIX) 20 MG tablet TAKE 1 TABLET EVERY DAY 90 tablet 3   Probiotic Product (PROBIOTIC PO) Take 1 capsule by mouth at bedtime.      traMADol (ULTRAM) 50 MG tablet TAKE 1 TABLET BY MOUTH TWICE DAILY AS NEEDED FOR  MODERATE  OR  SEVERE  PAIN. 60 tablet 0   TRUE METRIX BLOOD GLUCOSE TEST test strip TEST BLOOD SUGAR THREE TIMES DAILY AS DIRECTED 300 strip 11   TRUEplus Lancets 33G MISC USE TO CHECK BLOOD SUGAR 3 TIMES A DAY 300 each 3   atorvastatin (LIPITOR) 40 MG tablet Take 1 tablet (40 mg total) by mouth daily. 90 tablet 3   No facility-administered medications prior to visit.    Allergies  Allergen Reactions   Brilinta [Ticagrelor] Shortness Of Breath   Insulin Detemir Swelling, Other (See Comments) and Itching    Patient had redness and swelling and tenderness at injection site. Patient had redness and swelling and tenderness at injection site.   Bee Venom    Codeine Other (See Comments)    Makes him "shakey", is OK with hydrocodone GI UPSET & TREMORS Makes him "shakey", is OK with hydrocodone Makes him "shakey", is OK with hydrocodone GI UPSET & TREMORS Other reaction(s): GI issues   Gadobenate     Other reaction(s): fever, sweats and nausea   Insulin Aspart    Lipitor [Atorvastatin] Other (See Comments)    Muscle aches; affects liver function- Patient is currently taking   Novolog Mix [Insulin Aspart Prot & Aspart] Other (See Comments)    Causes skin to swell/itch at injection site    Review of Systems  Constitutional:  Positive for malaise/fatigue. Negative for  fever.  HENT:  Negative for congestion.   Eyes:  Negative for blurred vision.  Respiratory:  Negative for cough.   Cardiovascular:  Negative for chest pain and palpitations.  Gastrointestinal:  Negative for vomiting.  Musculoskeletal:  Positive for back pain.  Skin:  Negative for rash.  Neurological:  Negative for loss of consciousness and headaches.      Objective:    Physical Exam Constitutional:      General: He is not in acute distress.    Appearance: Normal appearance. He is not ill-appearing or toxic-appearing.  HENT:     Head: Normocephalic and atraumatic.     Right Ear: External ear normal.     Left Ear: External ear normal.     Nose: Nose normal.  Eyes:     General:        Right eye: No discharge.        Left eye: No discharge.  Cardiovascular:     Rate and Rhythm: Normal rate and regular rhythm.  Pulmonary:  Effort: Pulmonary effort is normal.  Musculoskeletal:        General: No swelling.  Lymphadenopathy:     Cervical: No cervical adenopathy.  Skin:    Findings: No rash.  Neurological:     Mental Status: He is alert and oriented to person, place, and time.  Psychiatric:        Behavior: Behavior normal.    BP 132/80    Pulse 70    Temp 97.9 F (36.6 C)    Resp 16    Ht '5\' 10"'  (1.778 m)    Wt 233 lb 8 oz (105.9 kg)    SpO2 92%    BMI 33.50 kg/m  Wt Readings from Last 3 Encounters:  10/27/21 233 lb 8 oz (105.9 kg)  10/17/21 253 lb (114.8 kg)  09/11/21 244 lb (110.7 kg)    Diabetic Foot Exam - Simple   No data filed    Lab Results  Component Value Date   WBC 7.0 09/09/2021   HGB 13.1 09/09/2021   HCT 39.0 09/09/2021   PLT 195 09/09/2021   GLUCOSE 174 (H) 09/09/2021   CHOL 123 06/26/2021   TRIG 186.0 (H) 06/26/2021   HDL 27.00 (L) 06/26/2021   LDLDIRECT 57.0 11/21/2019   LDLCALC 58 06/26/2021   ALT 18 06/26/2021   AST 19 06/26/2021   NA 140 09/09/2021   K 3.7 09/09/2021   CL 104 09/09/2021   CREATININE 1.94 (H) 09/09/2021   BUN 39  (H) 09/09/2021   CO2 28 09/09/2021   TSH 2.82 06/26/2021   PSA 1.01 11/02/2018   INR 1.1 02/24/2017   HGBA1C 7.1 (H) 09/09/2021   MICROALBUR 0.7 08/02/2014    Lab Results  Component Value Date   TSH 2.82 06/26/2021   Lab Results  Component Value Date   WBC 7.0 09/09/2021   HGB 13.1 09/09/2021   HCT 39.0 09/09/2021   MCV 91.3 09/09/2021   PLT 195 09/09/2021   Lab Results  Component Value Date   NA 140 09/09/2021   K 3.7 09/09/2021   CO2 28 09/09/2021   GLUCOSE 174 (H) 09/09/2021   BUN 39 (H) 09/09/2021   CREATININE 1.94 (H) 09/09/2021   BILITOT 0.4 06/26/2021   ALKPHOS 53 06/26/2021   AST 19 06/26/2021   ALT 18 06/26/2021   PROT 6.7 06/26/2021   ALBUMIN 3.9 06/26/2021   CALCIUM 9.9 09/09/2021   ANIONGAP 8 09/09/2021   EGFR 48 (L) 02/26/2021   GFR 45.64 (L) 06/26/2021   Lab Results  Component Value Date   CHOL 123 06/26/2021   Lab Results  Component Value Date   HDL 27.00 (L) 06/26/2021   Lab Results  Component Value Date   LDLCALC 58 06/26/2021   Lab Results  Component Value Date   TRIG 186.0 (H) 06/26/2021   Lab Results  Component Value Date   CHOLHDL 5 06/26/2021   Lab Results  Component Value Date   HGBA1C 7.1 (H) 09/09/2021       Assessment & Plan:   Problem List Items Addressed This Visit     Gout - Primary    Hydrate and monitor      Relevant Orders   Uric acid   Renal insufficiency    Hydrate and monitor      Hyperlipidemia    Encourage heart healthy diet such as MIND or DASH diet, increase exercise, avoid trans fats, simple carbohydrates and processed foods, consider a krill or fish or flaxseed oil cap daily.  Relevant Orders   Lipid panel   TSH   Essential hypertension    Well controlled, no changes to meds. Encouraged heart healthy diet such as the DASH diet and exercise as tolerated.       Relevant Orders   CBC   Comprehensive metabolic panel   Microalbumin / creatinine urine ratio   Hypertriglyceridemia     Encourage heart healthy diet such as MIND or DASH diet, increase exercise, avoid trans fats, simple carbohydrates and processed foods, consider a krill or fish or flaxseed oil cap daily.       BPH with obstruction/lower urinary tract symptoms   Lumbar radiculopathy    Had to have decompression/fusion at L5-S1 with Dr Ellene Route on 09/11/2021. He is now walking without a cane now but he is now struggling with pain over right sacroiliac joint, he will follow up with Dr Ellene Route.       Controlled diabetes mellitus type 2 with complications (HCC)    [id number] acceptable, minimize simple carbs. Increase exercise as tolerated. Continue current meds      Lumbar stenosis with neurogenic claudication    Had surgery on 12/1 with Dr Ellene Route, repair at L5 S1 and he reports he is doing much better less pain and no longer needs a cane.      Burn by hot liquid    Still has a spot that can open up with any minor trauma encouraged to apply neosporin and bandage when he is out.       Cough    RLL rhonchi, check CXR. Has Hydromet which helps him rest and he is encouraged to use plain mucinex bid for now      Relevant Orders   DG Chest 2 View   Other Visit Diagnoses     Type 2 diabetes mellitus with hyperglycemia, unspecified whether long term insulin use ([place])       Relevant Orders   Microalbumin / creatinine urine ratio       I am having Elyn Aquas. Lorain Childes "[name]" maintain his fluocinonide cream, aspirin EC, acetaminophen, B Complex Vitamins (B COMPLEX-B12 PO), fluticasone, Probiotic Product (PROBIOTIC PO), Vitamin D3, MAGNESIUM PO, Insulin Syringe-Needle U-100, insulin regular, Chlorpheniramine Maleate (CHLOR-TRIMETON ALLERGY PO), Melatonin, insulin NPH Human, alfuzosin, True Metrix Blood Glucose Test, TRUEplus Lancets 33G, True Metrix Meter, pantoprazole, atorvastatin, nitroGLYCERIN, amLODipine, fenofibrate, allopurinol, baclofen, HYDROcodone-acetaminophen, Droplet Insulin Syringe, furosemide, metoprolol  tartrate, isosorbide mononitrate, methocarbamol, and traMADol.  No orders of the defined types were placed in this encounter.    Penni Homans, MD

## 2021-10-27 NOTE — Assessment & Plan Note (Signed)
Encourage heart healthy diet such as MIND or DASH diet, increase exercise, avoid trans fats, simple carbohydrates and processed foods, consider a krill or fish or flaxseed oil cap daily.  °

## 2021-10-27 NOTE — Assessment & Plan Note (Signed)
RLL rhonchi, check CXR. Has Hydromet which helps him rest and he is encouraged to use plain mucinex bid for now

## 2021-10-27 NOTE — Assessment & Plan Note (Signed)
hgba1c acceptable, minimize simple carbs. Increase exercise as tolerated. Continue current meds 

## 2021-10-27 NOTE — Assessment & Plan Note (Signed)
Had surgery on 12/1 with Dr Ellene Route, repair at L5 S1 and he reports he is doing much better less pain and no longer needs a cane.

## 2021-10-27 NOTE — Assessment & Plan Note (Signed)
Had to have decompression/fusion at L5-S1 with Dr Ellene Route on 09/11/2021. He is now walking without a cane now but he is now struggling with pain over right sacroiliac joint, he will follow up with Dr Ellene Route.

## 2021-10-28 DIAGNOSIS — E119 Type 2 diabetes mellitus without complications: Secondary | ICD-10-CM | POA: Diagnosis not present

## 2021-10-28 DIAGNOSIS — Z794 Long term (current) use of insulin: Secondary | ICD-10-CM | POA: Diagnosis not present

## 2021-10-28 DIAGNOSIS — Z961 Presence of intraocular lens: Secondary | ICD-10-CM | POA: Diagnosis not present

## 2021-10-28 DIAGNOSIS — H524 Presbyopia: Secondary | ICD-10-CM | POA: Diagnosis not present

## 2021-10-28 LAB — HM DIABETES EYE EXAM

## 2021-10-31 ENCOUNTER — Telehealth: Payer: Self-pay | Admitting: Cardiology

## 2021-10-31 NOTE — Telephone Encounter (Signed)
Returned call to wife she states that pt's weight is up 8#in the last day or 2. She states that pt has no swelling and no SOB. After discussing this with pt/wife found out that about a couple weeks ago pt "got tired of going to the bathroom all the time" and stopped lasix " for a couple days(doesn't know how long but he states that he is sure that he has been taking the lasix this week. But he also told me that he was only taking 1 pill(40mg ) and he is supposed to take 2(80mg ).  Wife will make sure that pt takes and logs his weight daily and take the correct dose (80mg ) of his lasix daily. She will call back on Monday if this does not take off the 8# or close to that, Hopefully. Informed wife that if the weight does not go down or SOB to take pt to the ER.

## 2021-10-31 NOTE — Telephone Encounter (Signed)
Pt c/o swelling: STAT is pt has developed SOB within 24 hours  If swelling, where is the swelling located? No swelling  How much weight have you gained and in what time span? 8 lbs in a day  Have you gained 3 pounds in a day or 5 pounds in a week? 8 lbs in a day  Do you have a log of your daily weights (if so, list)? yes  Are you currently taking a fluid pill? yes  Are you currently SOB?  Yes, off and on  Have you traveled recently? No- patient's wife  wants patient to be seen asap- I made an appointment 2-3-23t

## 2021-11-03 ENCOUNTER — Other Ambulatory Visit (INDEPENDENT_AMBULATORY_CARE_PROVIDER_SITE_OTHER): Payer: Medicare HMO

## 2021-11-03 DIAGNOSIS — D649 Anemia, unspecified: Secondary | ICD-10-CM

## 2021-11-03 NOTE — Progress Notes (Deleted)
HPI: FU CAD. Pt has had previous PCI of his LAD. Patient had admission October 2017 with NSTEMI. He had PCI of the circumflex and RCA with drug-eluting stents. Cardiac catheterization repeated May 2018 because of dyspnea and chest pain. There was continued patency of stent segments in his RCA, left circumflex and LAD. Left ventricular end-diastolic pressure moderately elevated (23). Admitted with CHF and chest pain December 2019.  Enzymes negative. Echo December 2019 showed normal LV function and mild left atrial enlargement. Nuclear study December 2019 showed no ischemia or infarction. ABIs 3/21 normal.  Abdominal ultrasound January 2023 showed 3.1 cm abdominal aortic aneurysm and near ectasia of bilateral common iliac arteries..  Patient contacted the office recently with increased volume.  Since last seen,   Current Outpatient Medications  Medication Sig Dispense Refill   acetaminophen (TYLENOL) 500 MG tablet Take 500 mg by mouth 2 (two) times daily.      alfuzosin (UROXATRAL) 10 MG 24 hr tablet Take 10 mg by mouth in the morning and at bedtime.     allopurinol (ZYLOPRIM) 300 MG tablet TAKE 1 TABLET EVERY DAY 90 tablet 1   amLODipine (NORVASC) 5 MG tablet Take 1 tablet (5 mg total) by mouth daily. (Patient taking differently: Take 5 mg by mouth at bedtime.) 90 tablet 3   aspirin EC 81 MG tablet Take 81 mg by mouth daily.     atorvastatin (LIPITOR) 40 MG tablet Take 1 tablet (40 mg total) by mouth daily. 90 tablet 3   B Complex Vitamins (B COMPLEX-B12 PO) Take 1 tablet by mouth daily.      baclofen (LIORESAL) 10 MG tablet Take 1-2 tablets (10-20 mg total) by mouth at bedtime as needed for muscle spasms. (Patient taking differently: Take 10 mg by mouth at bedtime.) 60 each 1   Blood Glucose Monitoring Suppl (TRUE METRIX METER) w/Device KIT USE AS DIRECTED  TO TEST BLOOD GLUCOSE 1 kit 0   Chlorpheniramine Maleate (CHLOR-TRIMETON ALLERGY PO) Take 1 tablet by mouth in the morning and at bedtime.      Cholecalciferol (VITAMIN D3) 50 MCG (2000 UT) capsule Take 2,000 Units by mouth at bedtime.      DROPLET INSULIN SYRINGE 31G X 5/16" 1 ML MISC USE TO INJECT INSULIN 5 TIMES DAILY 500 each 0   fenofibrate 160 MG tablet TAKE 1 TABLET AT BEDTIME 90 tablet 1   fluocinonide cream (LIDEX) 7.32 % Apply 1 application topically daily as needed (for rashes).      fluticasone (FLONASE) 50 MCG/ACT nasal spray Place 2 sprays into both nostrils daily. (Patient taking differently: Place 2 sprays into both nostrils daily as needed for allergies.) 16 g 1   furosemide (LASIX) 40 MG tablet TAKE 2 TABLETS (80 MG) DAILY. 180 tablet 3   HYDROcodone-acetaminophen (NORCO) 5-325 MG tablet Take 0.5-1 tablets by mouth 2 (two) times daily as needed for moderate pain. (Patient taking differently: Take 0.5 tablets by mouth at bedtime.) 60 tablet 0   insulin NPH Human (NOVOLIN N) 100 UNIT/ML injection Inject 0.5 mLs (50 Units total) into the skin 2 (two) times daily before a meal. (Patient taking differently: Inject 40 Units into the skin 2 (two) times daily before a meal.) 10 mL 11   insulin regular (NOVOLIN R RELION) 100 units/mL injection Inject 25-30 units 3 times a day before meals. (Patient taking differently: Inject 30 Units into the skin 3 (three) times daily before meals.) 60 mL 1   Insulin Syringe-Needle U-100 (BD  INSULIN SYRINGE U/F) 31G X 5/16" 0.3 ML MISC Use to inject insulin 5 times a day. 500 each 11   isosorbide mononitrate (IMDUR) 30 MG 24 hr tablet TAKE 1 TABLET (30 MG TOTAL) BY MOUTH DAILY. 90 tablet 0   MAGNESIUM PO Take 1 tablet by mouth at bedtime.     Melatonin 10 MG TABS Take 10 mg by mouth at bedtime.     methocarbamol (ROBAXIN) 500 MG tablet Take 1 tablet (500 mg total) by mouth every 6 (six) hours as needed for muscle spasms. 30 tablet 3   metoprolol tartrate (LOPRESSOR) 25 MG tablet TAKE 1 TABLET TWICE DAILY 180 tablet 1   nitroGLYCERIN (NITROSTAT) 0.4 MG SL tablet Place 1 tablet (0.4 mg total)  under the tongue every 5 (five) minutes as needed for chest pain. 100 tablet 3   pantoprazole (PROTONIX) 20 MG tablet TAKE 1 TABLET EVERY DAY 90 tablet 3   Probiotic Product (PROBIOTIC PO) Take 1 capsule by mouth at bedtime.      traMADol (ULTRAM) 50 MG tablet TAKE 1 TABLET BY MOUTH TWICE DAILY AS NEEDED FOR  MODERATE  OR  SEVERE  PAIN. 60 tablet 0   TRUE METRIX BLOOD GLUCOSE TEST test strip TEST BLOOD SUGAR THREE TIMES DAILY AS DIRECTED 300 strip 11   TRUEplus Lancets 33G MISC USE TO CHECK BLOOD SUGAR 3 TIMES A DAY 300 each 3   No current facility-administered medications for this visit.     Past Medical History:  Diagnosis Date   AAA (abdominal aortic aneurysm) 12/15/2014   a. Mild aneurysmal dilatation of the infrarenal abdominal aorta 3.2 cm - f/u due by 2019.   Arthritis    "all over" (07/30/2016)   CAD (coronary artery disease)    a. stent to LAD 2000. b. possible spasm by cath 2001. c. IVUS/PTCA/DES to mLAD 05/2013. d. PTCA/DES of dRCA into ostial rPDA 07/2013. e. PTCA of OM2, DES to Cx, DES to [facility] 07/2016.   Chronic bronchitis (HCC)    Chronic diastolic CHF (congestive heart failure) (HCC)    Chronic lower back pain    Chronic neck pain    CKD (chronic kidney disease), stage III (HCC)    Diabetic peripheral neuropathy ([place])    Ejection fraction    55%, 07/2010, mild inferior hypo   GERD (gastroesophageal reflux disease)    Gout    H/O diverticulitis of colon 01/31/2015   H/O hiatal hernia    History of blood transfusion ~ 10/1940   "had pneumonia"   History of kidney stones    HTN (hypertension)    Hyperlipidemia    Melanoma of forearm, left ([place]) 02/2011   with wide excision    Myocardial infarction ([place])    Nephrolithiasis    Obesity    OSA on CPAP     [name] since 2000   Peripheral neuropathy    both feet   Positive D-dimer    a. significant elevation ,hospital 07/2010, etiology unclear. b. D Dimer chronically > 20.   Renal insufficiency    Skin cancer     Spinal stenosis    a. s/p surgical repair 2013   Spinal stenosis of lumbar region    Type II diabetes mellitus ([place])    Ventral hernia     Past Surgical History:  Procedure Laterality Date   ANTERIOR LAT LUMBAR FUSION  10/22/2017   Procedure: Lumbar two-three Lumbar three-four Lumbar four-five Anteriolateral lumbar interbody fusion with percutaneous pedicle screw fixation and infuse;  Surgeon: [name], MD;  Location: [place];  Service: Neurosurgery;;   APPLICATION OF ROBOTIC ASSISTANCE FOR SPINAL PROCEDURE  10/22/2017   Procedure: APPLICATION OF ROBOTIC ASSISTANCE FOR SPINAL PROCEDURE;  Surgeon: [name], MD;  Location: [place];  Service: Neurosurgery;;   BACK SURGERY     CARDIAC CATHETERIZATION N/A 07/30/2016   Procedure: Left Heart Cath and Coronary Angiography;  Surgeon: Nelva Bush, MD;  Location: [place] CV LAB;  Service: Cardiovascular;  Laterality: N/A;   CARDIAC CATHETERIZATION N/A 07/30/2016   Procedure: Coronary Stent Intervention;  Surgeon: Nelva Bush, MD;  Location: [place] [facility];  Service: Cardiovascular;  Laterality: N/A;  Mid CFX and MID RCA   CARDIAC CATHETERIZATION N/A 07/30/2016   Procedure: Coronary Balloon Angioplasty;  Surgeon: Nelva Bush, MD;  Location: [place] [facility];  Service: Cardiovascular;  Laterality: N/A;  OM 1   CARPAL TUNNEL RELEASE Left    CATARACT EXTRACTION W/ INTRAOCULAR LENS  IMPLANT, BILATERAL Bilateral ~ 2014   CERVICAL DISC SURGERY  1990s   CORONARY ANGIOPLASTY WITH STENT PLACEMENT  05/2013   CORONARY ANGIOPLASTY WITH STENT PLACEMENT  07/26/2013   DES to RCA extending to PDA     CORONARY ANGIOPLASTY WITH STENT PLACEMENT  07/30/2016   CORONARY ANGIOPLASTY WITH STENT PLACEMENT  2000   CAD   DENTAL SURGERY  04/2016   "got infected; had to dig it out"   EYE SURGERY     KNEE ARTHROSCOPY Right 1990's   rt   LEFT AND RIGHT HEART CATHETERIZATION WITH CORONARY ANGIOGRAM N/A 07/26/2013   Procedure: LEFT AND RIGHT HEART  CATHETERIZATION WITH CORONARY ANGIOGRAM;  Surgeon: [name], MD;  Location: [place] CATH LAB;  Service: Cardiovascular;  Laterality: N/A;   LEFT HEART CATH AND CORONARY ANGIOGRAPHY N/A 02/26/2017   Procedure: Left Heart Cath and Coronary Angiography;  Surgeon: Sherren Mocha, MD;  Location: [place] [facility] LAB;  Service: Cardiovascular;  Laterality: N/A;   LEFT HEART CATHETERIZATION WITH CORONARY ANGIOGRAM N/A 05/19/2013   Procedure: LEFT HEART CATHETERIZATION WITH CORONARY ANGIOGRAM;  Surgeon: [name], MD;  Location: [facility] C[facility] LAB;  Service: Cardiovascular;  Laterality: N/A;   LUMBAR LAMINECTOMY/DECOMPRESSION MICRODISCECTOMY  08/30/2012   Procedure: LUMBAR LAMINECTOMY/DECOMPRESSION MICRODISCECTOMY 2 LEVELS;  Surgeon: [name], MD;  Location: [place] [facility];  Service: Neurosurgery;  Laterality: Bilateral;  Bilateral Lumbar three-four Lumbar four-five Laminotomies   LUMBAR PERCUTANEOUS PEDICLE SCREW 1 LEVEL Bilateral 10/22/2017   Procedure: LUMBAR PERCUTANEOUS PEDICLE SCREW 1 LEVEL;  Surgeon: [name], MD;  Location: [place];  Service: Neurosurgery;  Laterality: Bilateral;   LUMBAR SPINE SURGERY  04/07/2016   Dr. Ellene Route; "?ruptured disc"   MELANOMA EXCISION Left 02/2011   forearm   NASAL SINUS SURGERY  1970s   "cut windows in sinus pockets"   PERCUTANEOUS CORONARY STENT INTERVENTION (PCI-S)  05/19/2013   Procedure: PERCUTANEOUS CORONARY STENT INTERVENTION (PCI-S);  Surgeon: [name], MD;  Location[facility] CATH LAB;  Service: Cardiovascular;;   ULNAR TUNNEL RELEASE Left 04/07/2016   Dr. Ellene Route    Social History   Socioeconomic History   Marital status: Married    Spouse name: Not on file   Number of children: Not on file   Years of education: Not on file   Highest education level: Not on file  Occupational History   Occupation: Painter  Tobacco Use   Smoking status: Former    Packs/day: 3.00    Years: 30.00    Pack years: 90.00    Types: Cigarettes  Quit date:  09/17/1996    Years since quitting: 25.1   Smokeless tobacco: Never  Vaping Use   Vaping Use: Never used  Substance and Sexual Activity   Alcohol use: No   Drug use: No   Sexual activity: Not on file  Other Topics Concern   Not on file  Social History Narrative   Married and lives locally with his wife.  Sharyon Cable.   Social Determinants of Health   Financial Resource Strain: Low Risk    Difficulty of Paying Living Expenses: Not hard at all  Food Insecurity: Not on file  Transportation Needs: No Transportation Needs   Lack of Transportation (Medical): No   Lack of Transportation (Non-Medical): No  Physical Activity: Inactive   Days of Exercise per Week: 0 days   Minutes of Exercise per Session: 0 min  Stress: Not on file  Social Connections: Not on file  Intimate Partner Violence: Not on file    Family History  Problem Relation Age of Onset   Cancer Mother        intestinal    Heart disease Mother    Cancer Father        prostate   Migraines Daughter    Leukemia Sister    Heart disease Sister    Prostate cancer Other    Kidney cancer Other    Cancer Other        Bladder cancer   Coronary artery disease Other    Hypertension Sister    Hypertension Brother    Heart attack Neg Hx    Stroke Neg Hx     ROS: no fevers or chills, productive cough, hemoptysis, dysphasia, odynophagia, melena, hematochezia, dysuria, hematuria, rash, seizure activity, orthopnea, PND, pedal edema, claudication. Remaining systems are negative.  Physical Exam: Well-developed well-nourished in no acute distress.  Skin is warm and dry.  HEENT is normal.  Neck is supple.  Chest is clear to auscultation with normal expansion.  Cardiovascular exam is regular rate and rhythm.  Abdominal exam nontender or distended. No masses palpated. Extremities show no edema. neuro grossly intact  ECG- personally reviewed  A/P  1 coronary artery disease-patient denies chest pain.  Plan to continue  medical therapy with aspirin and statin.  2 chronic diastolic congestive heart failure-  3 hypertension-patient's blood pressure is controlled.  However he is complaining of increased pedal edema which may be secondary to amlodipine.  4 hyperlipidemia-continue statin.  5 abdominal aortic aneurysm-plan follow-up ultrasound January 2024.  Kirk Ruths, MD

## 2021-11-03 NOTE — Telephone Encounter (Signed)
Spoke with pt who states that he is not SOB, no chest pain, and no swelling. Current weights are as follows Friday 226 lbs, Saturday 230.1 lbs, Sunday 228.4 lbs, and today 229. He reports that he is now taking Lasix 80 mg daily. Please advise.

## 2021-11-03 NOTE — Telephone Encounter (Signed)
Follow Up:    Patient's wife said Sharyn Lull told her to call back today.

## 2021-11-04 ENCOUNTER — Other Ambulatory Visit: Payer: Self-pay | Admitting: *Deleted

## 2021-11-04 DIAGNOSIS — D649 Anemia, unspecified: Secondary | ICD-10-CM

## 2021-11-04 NOTE — Progress Notes (Signed)
ifob

## 2021-11-05 ENCOUNTER — Other Ambulatory Visit: Payer: Self-pay

## 2021-11-05 ENCOUNTER — Telehealth: Payer: Self-pay | Admitting: *Deleted

## 2021-11-05 DIAGNOSIS — D649 Anemia, unspecified: Secondary | ICD-10-CM

## 2021-11-05 LAB — FECAL OCCULT BLOOD, IMMUNOCHEMICAL: Fecal Occult Bld: POSITIVE — AB

## 2021-11-05 MED ORDER — FERROUS FUMARATE 324 (106 FE) MG PO TABS: 1.0000 | ORAL_TABLET | Freq: Every day | ORAL | 2 refills | Status: DC

## 2021-11-05 MED ORDER — FAMOTIDINE 40 MG PO TABS: 40.0000 mg | ORAL_TABLET | Freq: Every day | ORAL | 2 refills | Status: DC

## 2021-11-05 NOTE — Telephone Encounter (Signed)
Just William Hendrix from [facility] called and stated patient had a positive ifob. Results are in.

## 2021-11-06 NOTE — Telephone Encounter (Signed)
Pt notified that he is to continue Lasix 80 mg Daily.

## 2021-11-10 ENCOUNTER — Telehealth: Payer: Self-pay | Admitting: Internal Medicine

## 2021-11-10 DIAGNOSIS — E1159 Type 2 diabetes mellitus with other circulatory complications: Secondary | ICD-10-CM

## 2021-11-10 DIAGNOSIS — Z794 Long term (current) use of insulin: Secondary | ICD-10-CM

## 2021-11-10 MED ORDER — INSULIN REGULAR HUMAN 100 UNIT/ML IJ SOLN: 30.0000 [IU] | Freq: Three times a day (TID) | INTRAMUSCULAR | 2 refills | Status: DC

## 2021-11-10 MED ORDER — INSULIN NPH (HUMAN) (ISOPHANE) 100 UNIT/ML ~~LOC~~ SUSP: 40.0000 [IU] | Freq: Two times a day (BID) | SUBCUTANEOUS | 2 refills | Status: DC

## 2021-11-10 NOTE — Telephone Encounter (Signed)
Rx sent to preferred pharmacy.

## 2021-11-10 NOTE — Telephone Encounter (Signed)
MEDICATION: insulin NPH Human (NOVOLIN N) 100 UNIT/ML injection insulin regular (NOVOLIN R RELION) 100 units/mL injection  PHARMACY:   [facility] [place] [facility], [place] Phone:  [phone]  Fax:  [phone]      HAS THE PATIENT CONTACTED THEIR PHARMACY?  YES  IS THIS A 90 DAY SUPPLY : YES  IS PATIENT OUT OF MEDICATION: YES  IF NOT; HOW MUCH IS LEFT:   LAST APPOINTMENT DATE: @1 /03/2022  NEXT APPOINTMENT DATE:@5 /08/2022  DO WE HAVE YOUR PERMISSION TO LEAVE A DETAILED MESSAGE?:  OTHER COMMENTS: [facility] mail order is requesting new prescription on the above. [facility] is requesting a call concerning this medication.   **Let patient know to contact pharmacy at the end of the day to make sure medication is ready. **  ** Please notify patient to allow 48-72 hours to process**  **Encourage patient to contact the pharmacy for refills or they can request refills through [facility]**

## 2021-11-11 ENCOUNTER — Other Ambulatory Visit: Payer: Self-pay | Admitting: Family Medicine

## 2021-11-12 DIAGNOSIS — M5416 Radiculopathy, lumbar region: Secondary | ICD-10-CM | POA: Diagnosis not present

## 2021-11-14 ENCOUNTER — Ambulatory Visit: Payer: Medicare HMO | Admitting: Cardiology

## 2021-11-17 DIAGNOSIS — J449 Chronic obstructive pulmonary disease, unspecified: Secondary | ICD-10-CM | POA: Diagnosis not present

## 2021-11-17 DIAGNOSIS — R3912 Poor urinary stream: Secondary | ICD-10-CM | POA: Diagnosis not present

## 2021-11-17 DIAGNOSIS — N401 Enlarged prostate with lower urinary tract symptoms: Secondary | ICD-10-CM | POA: Diagnosis not present

## 2021-11-17 DIAGNOSIS — I509 Heart failure, unspecified: Secondary | ICD-10-CM | POA: Diagnosis not present

## 2021-11-17 DIAGNOSIS — R3911 Hesitancy of micturition: Secondary | ICD-10-CM | POA: Diagnosis not present

## 2021-11-19 ENCOUNTER — Other Ambulatory Visit: Payer: Medicare HMO

## 2021-11-26 MED ORDER — INSULIN NPH (HUMAN) (ISOPHANE) 100 UNIT/ML ~~LOC~~ SUSP: 40.0000 [IU] | Freq: Two times a day (BID) | SUBCUTANEOUS | 2 refills | Status: DC

## 2021-11-26 NOTE — Telephone Encounter (Addendum)
Pt called advising pharmacy requested more information.  Called pharmacy and gave verbal rx for Novolin R New rx for Novolin N e-scribed

## 2021-11-26 NOTE — Addendum Note (Signed)
Addended by: Lauralyn Primes on: 11/26/2021 03:23 PM   Modules accepted: Orders

## 2021-12-01 ENCOUNTER — Ambulatory Visit (INDEPENDENT_AMBULATORY_CARE_PROVIDER_SITE_OTHER): Payer: Medicare HMO | Admitting: Pharmacist

## 2021-12-01 ENCOUNTER — Telehealth: Payer: Self-pay | Admitting: Pharmacist

## 2021-12-01 DIAGNOSIS — T300 Burn of unspecified body region, unspecified degree: Secondary | ICD-10-CM

## 2021-12-01 DIAGNOSIS — D649 Anemia, unspecified: Secondary | ICD-10-CM

## 2021-12-01 DIAGNOSIS — E785 Hyperlipidemia, unspecified: Secondary | ICD-10-CM

## 2021-12-01 DIAGNOSIS — E1165 Type 2 diabetes mellitus with hyperglycemia: Secondary | ICD-10-CM
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
## 2021-12-01 NOTE — Patient Instructions (Addendum)
Mr. Cumbie It was a pleasure speaking with you today.  I have attached a summary of our visit today and information about your health goals.   Patient Goals/Self-Care Activities Over the next 90 days, patient will:  take medications as prescribed check glucose 3 to 4 times a day, document, and provide at future appointments, and Forwarding message to Dr Charlett Blake regarding non healing wound on shin.  If you have any questions or concerns, please feel free to contact me either at the phone number below or with a MyChart message.   Keep up the good work!  Cherre Robins, PharmD Clinical Pharmacist [facility] [phone] (direct line)  [phone] (main office number)  CARE PLAN ENTRY (see longitudinal plan of care for additional care plan information)  Current Barriers:  Chronic Disease Management support, education, and care coordination needs related to Hypertension, Hyperlipidemia/CAD, Diabetes, GERD, Depression, Insomnia, BPH, Gout, Lumbar Radiculopathy   Hypertension / Heart Failure: BP Readings from Last 3 Encounters:  10/27/21 132/80  10/17/21 120/70  09/12/21 128/66   Pharmacist Clinical Goal(s): Over the next 90 days, patient will work with PharmD and providers to maintain BP goal <130/80 Current regimen:  Metoprolol Tartrate 25mg  - take 1 tablet twice daily  Amlodipine 5mg  daily  Interventions: Discussed blood pressure goal Recommended continued monitoring blood pressure 3 to 4 times per week Patient self care activities - Over the next 90 days, patient will: Check blood pressure 3 to 4 times per week, document, and provide at future appointments Ensure daily salt intake < 2300 mg/day Continue current medications for lowering blood pressure  Hyperlipidemia, mixed (elevated LDL and triglycerides) Lab Results  Component Value Date/Time   LDLCALC 45 10/27/2021 12:34 PM   [place] 64 08/28/2020 11:23 AM   LDLDIRECT 57.0 11/21/2019  08:24 AM   Pharmacist Clinical Goal(s): Over the next 90 days, patient will work with PharmD and providers to maintain LDL goal < 70, and achieve Triglyceride goal < 150 Current regimen:  Atorvastatin 40mg  daily Fenofibrate 160mg  daily Aspirin 81mg  daily Isosorbide Mononitrate 30mg  daily Nitriglycerine - place 1 tablet under tongue as needed for chest pain, may repeat in 5 minutes if chest pain continues but if 2nd dose is needed, call 911 for assistance.  Interventions: Discussed LDL and triglyceride goal Patient self care activities - Over the next 90 days, patient will: Maintain current cholesterol and heart medication regimen.  Work at eating less saturated fats and fried foods. Limit intake of sugar and carbohydrate containing foods.   Diabetes Lab Results  Component Value Date/Time   HGBA1C 7.1 (H) 09/09/2021 02:57 PM   HGBA1C 6.0 (A) 05/27/2021 03:31 PM   HGBA1C 6.7 (A) 01/14/2021 02:56 PM   HGBA1C 8.0 (H) 11/21/2019 08:24 AM   Pharmacist Clinical Goal(s): Over the next 90 days, patient will work with PharmD and providers to maintain A1c goal <7% Current regimen:  Novolin N 40 units each morning and each evening Novolin R 10 to 30 units prior to each meal based on blood glucose and sliding scale recommended by Dr Cruzita Lederer) Interventions: Discussed home blood glucose and A1c goal Reviewed home blood glucose readings and reviewed goals  Fasting blood glucose goal (before meals) = 80 to 130 Blood glucose goal after a meal = less than 180  Reviewed how to identify and treat low blood glucose. Patient self care activities - Over the next 90 days, patient will: Check blood sugar 3 to 4 times daily, before meals; document, and  provide at future appointments Contact provider with any episodes of hypoglycemia  Lumbar Radiculopathy / chronic pain / burn wounds Pharmacist Clinical Goal(s) Over the next 90 days, patient will work with PharmD and providers to reduce pain  Current  regimen:  Acetaminophen 500 to 1000mg  as needed for pain up to every 8 hours Baclofen 10mg  - take 1 or 2 tablets at bedtime as needed. Patient self care activities - Over the next 90 days, patient will: Maintain pain medication regimen Follow up with Dr Ellene Route / neurosurgeon - surgery planned for 09/11/2021  Gout:  Current regimen:  Allopurinol 300mg  daily  Interventions:  Continue current mediation regimen for  gout.   Health Maintenance:  Reviewed vaccination history and discussed benefits of COVID booster and Shingrix vaccinations Patient declined COVID bivalent booster at this time.  Considering Shingrix - will wait until 2023 to check cost.  Medication management Pharmacist Clinical Goal(s): Over the next 90 days, patient will work with PharmD and providers to maintain optimal medication adherence Current pharmacy: [facility] Order Interventions Comprehensive medication review performed. Continue current medication management strategy Patient self care activities - Over the next 90 days, patient will: Focus on medication adherence by filling and taking medications appropriately  Take medications as prescribed Report any questions or concerns to PharmD and/or provider(s)  Patient verbalizes understanding of instructions and care plan provided today and agrees to view in [place]. Active MyChart status confirmed with patient.
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
## 2021-12-03 NOTE — Telephone Encounter (Signed)
LM requesting call back to schedule appointment for assessment/referral.

## 2021-12-03 NOTE — Telephone Encounter (Signed)
Appointment made with William Hendrix on 02/24.

## 2021-12-05 ENCOUNTER — Ambulatory Visit (INDEPENDENT_AMBULATORY_CARE_PROVIDER_SITE_OTHER): Payer: Medicare HMO | Admitting: Medical

## 2021-12-05 VITALS — BP 120/48 | HR 83 | Resp 18 | Ht 70.0 in | Wt 231.2 lb

## 2021-12-05 DIAGNOSIS — L089 Local infection of the skin and subcutaneous tissue, unspecified: Secondary | ICD-10-CM | POA: Diagnosis not present

## 2021-12-05 DIAGNOSIS — M62830 Muscle spasm of back: Secondary | ICD-10-CM | POA: Diagnosis not present

## 2021-12-05 MED ORDER — DOXYCYCLINE HYCLATE 100 MG PO TABS: 100.0000 mg | ORAL_TABLET | Freq: Two times a day (BID) | ORAL | 0 refills | Status: DC

## 2021-12-05 MED ORDER — BACLOFEN 10 MG PO TABS: ORAL_TABLET | ORAL | 0 refills | Status: DC
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
## 2021-12-05 NOTE — Patient Instructions (Addendum)
Delayed wound healing for month with concern for rt lower ext skin infection. Rx doxycycline antibiotic. Wound culture sent out. On follow determined if need referral to wound center/possible unaboot.  For back spasm will refill your baclofen.(Pcp rxd prior)  Follow up in 10 days or sooner if needed.

## 2021-12-09 DIAGNOSIS — E1165 Type 2 diabetes mellitus with hyperglycemia: Secondary | ICD-10-CM | POA: Diagnosis not present

## 2021-12-09 DIAGNOSIS — D649 Anemia, unspecified: Secondary | ICD-10-CM

## 2021-12-09 DIAGNOSIS — E785 Hyperlipidemia, unspecified: Secondary | ICD-10-CM | POA: Diagnosis not present

## 2021-12-09 LAB — WOUND CULTURE
MICRO NUMBER:: 13054558
SPECIMEN QUALITY:: ADEQUATE

## 2021-12-15 ENCOUNTER — Ambulatory Visit (INDEPENDENT_AMBULATORY_CARE_PROVIDER_SITE_OTHER): Payer: Medicare HMO | Admitting: Medical

## 2021-12-15 VITALS — BP 140/60 | HR 73 | Resp 18 | Ht 70.0 in | Wt 229.7 lb

## 2021-12-15 DIAGNOSIS — L089 Local infection of the skin and subcutaneous tissue, unspecified: Secondary | ICD-10-CM

## 2021-12-15 MED ORDER — SILVER SULFADIAZINE 1 % EX CREA: 1.0000 "application " | TOPICAL_CREAM | Freq: Every day | CUTANEOUS | 1 refills | Status: DC

## 2021-12-15 MED ORDER — DOXYCYCLINE HYCLATE 100 MG PO TABS: 100.0000 mg | ORAL_TABLET | Freq: Two times a day (BID) | ORAL | 0 refills | Status: DC
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
## 2021-12-19 DIAGNOSIS — M25561 Pain in right knee: Secondary | ICD-10-CM | POA: Diagnosis not present

## 2021-12-22 DIAGNOSIS — M25512 Pain in left shoulder: Secondary | ICD-10-CM | POA: Diagnosis not present

## 2021-12-30 ENCOUNTER — Other Ambulatory Visit (INDEPENDENT_AMBULATORY_CARE_PROVIDER_SITE_OTHER): Payer: Medicare HMO

## 2021-12-30 DIAGNOSIS — D649 Anemia, unspecified: Secondary | ICD-10-CM

## 2022-01-01 ENCOUNTER — Telehealth: Payer: Self-pay

## 2022-01-01 ENCOUNTER — Other Ambulatory Visit: Payer: Self-pay | Admitting: Internal Medicine

## 2022-01-01 LAB — FECAL OCCULT BLOOD, IMMUNOCHEMICAL: Fecal Occult Bld: POSITIVE — AB

## 2022-01-01 NOTE — Telephone Encounter (Signed)
Positive Ifob  ? ?Caller: Cristal Ford  ? ?Receiver: Manuela Schwartz  ? ?Date and time:  01/01/22 at 7:45 am ?

## 2022-01-01 NOTE — Addendum Note (Signed)
Addended by: Lynnea Ferrier R on: 01/01/2022 02:40 PM ? ? Modules accepted: Orders ? ?

## 2022-01-05 DIAGNOSIS — M25561 Pain in right knee: Secondary | ICD-10-CM | POA: Diagnosis not present

## 2022-01-13 DIAGNOSIS — M25561 Pain in right knee: Secondary | ICD-10-CM | POA: Diagnosis not present

## 2022-01-14 DIAGNOSIS — M25561 Pain in right knee: Secondary | ICD-10-CM | POA: Diagnosis not present

## 2022-01-21 DIAGNOSIS — M5416 Radiculopathy, lumbar region: Secondary | ICD-10-CM | POA: Diagnosis not present

## 2022-01-21 DIAGNOSIS — Z6832 Body mass index (BMI) 32.0-32.9, adult: Secondary | ICD-10-CM | POA: Diagnosis not present

## 2022-01-28 ENCOUNTER — Other Ambulatory Visit: Payer: Self-pay | Admitting: Internal Medicine

## 2022-01-28 ENCOUNTER — Other Ambulatory Visit: Payer: Self-pay | Admitting: Cardiology

## 2022-02-04 DIAGNOSIS — M25561 Pain in right knee: Secondary | ICD-10-CM | POA: Diagnosis not present

## 2022-02-05 ENCOUNTER — Telehealth: Payer: Self-pay

## 2022-02-05 NOTE — Telephone Encounter (Signed)
? ?  Pre-operative Risk Assessment  ?  ?Patient Name: William Hendrix  ?DOB: November 11, 1940 ?MRN: [id number]  ? ?  ? ?Request for Surgical Clearance   ? ?Procedure:   RT KNEE CYST EXCISION ? ?Date of Surgery:  Clearance TBD                              ?   ?Surgeon:  DR. Edmonia Lynch ?Surgeon's Group or Practice Name:  [facility] ?Phone number:  [phone] ?Fax number:  [phone] ?  ?Type of Clearance Requested:   ?- Medical  ?- Pharmacy:  Hold Aspirin NEEDS INSTRUCTIONS ?  ?Type of Anesthesia:   CHOICE ?  ?Additional requests/questions:   ? ?Signed, ?Jacinta Shoe   ?02/05/2022, 3:54 PM   ?

## 2022-02-05 NOTE — Progress Notes (Signed)
? ? ? ? ?HPI: FU CAD. Pt has had previous PCI of his LAD. Patient had admission October 2017 with NSTEMI. He had PCI of the circumflex and RCA with drug-eluting stents. Cardiac catheterization repeated May 2018 because of dyspnea and chest pain. There was continued patency of stent segments in his RCA, left circumflex and LAD. Left ventricular end-diastolic pressure moderately elevated (23). Admitted with CHF and chest pain December 2019.  Enzymes negative. Echo December 2019 showed normal LV function and mild left atrial enlargement. Nuclear study December 2019 showed no ischemia or infarction. ABIs 3/21 normal.  Abdominal ultrasound January 2023 showed 3.1 cm abdominal aortic aneurysm.  Since last seen, patient denies chest pain.  He has dyspnea with more vigorous activities but much improved since he had his back surgery.  No orthopnea, PND, increased pedal edema or syncope. ? ?Current Outpatient Medications  ?Medication Sig Dispense Refill  ? acetaminophen (TYLENOL) 500 MG tablet Take 500 mg by mouth 2 (two) times daily.     ? alfuzosin (UROXATRAL) 10 MG 24 hr tablet Take 10 mg by mouth in the morning and at bedtime.    ? allopurinol (ZYLOPRIM) 300 MG tablet TAKE 1 TABLET EVERY DAY 90 tablet 1  ? amLODipine (NORVASC) 5 MG tablet TAKE 1 TABLET EVERY DAY 90 tablet 3  ? aspirin EC 81 MG tablet Take 81 mg by mouth daily.    ? atorvastatin (LIPITOR) 40 MG tablet TAKE 1 TABLET EVERY DAY 90 tablet 3  ? B Complex Vitamins (B COMPLEX-B12 PO) Take 1 tablet by mouth daily.     ? baclofen (LIORESAL) 10 MG tablet 1 tab po q hs prn  back spasms 60 each 0  ? Blood Glucose Monitoring Suppl (TRUE METRIX METER) w/Device KIT USE AS DIRECTED  TO TEST BLOOD GLUCOSE 1 kit 0  ? Chlorpheniramine Maleate (CHLOR-TRIMETON ALLERGY PO) Take 1 tablet by mouth in the morning and at bedtime.    ? Cholecalciferol (VITAMIN D3) 50 MCG (2000 UT) capsule Take 2,000 Units by mouth at bedtime.     ? doxycycline (VIBRA-TABS) 100 MG tablet Take 1  tablet (100 mg total) by mouth 2 (two) times daily. 20 tablet 0  ? doxycycline (VIBRA-TABS) 100 MG tablet Take 1 tablet (100 mg total) by mouth 2 (two) times daily. 6 tablet 0  ? DROPLET INSULIN SYRINGE 31G X 5/16" 1 ML MISC USE TO INJECT INSULIN 5 TIMES DAILY 500 each 0  ? famotidine (PEPCID) 40 MG tablet Take 1 tablet (40 mg total) by mouth at bedtime. 30 tablet 2  ? fenofibrate 160 MG tablet TAKE 1 TABLET AT BEDTIME 90 tablet 1  ? Ferrous Fumarate (HEMOCYTE) 324 (106 Fe) MG TABS tablet Take 1 tablet (106 mg of iron total) by mouth daily. 30 tablet 2  ? fluocinonide cream (LIDEX) 0.21 % Apply 1 application topically daily as needed (for rashes).     ? fluticasone (FLONASE) 50 MCG/ACT nasal spray Place 2 sprays into both nostrils daily. (Patient taking differently: Place 2 sprays into both nostrils daily as needed for allergies.) 16 g 1  ? furosemide (LASIX) 40 MG tablet TAKE 2 TABLETS (80 MG) DAILY. 180 tablet 3  ? HYDROcodone-acetaminophen (NORCO) 5-325 MG tablet Take 0.5-1 tablets by mouth 2 (two) times daily as needed for moderate pain. 60 tablet 0  ? insulin NPH Human (NOVOLIN N) 100 UNIT/ML injection Inject 0.4 mLs (40 Units total) into the skin 2 (two) times daily before a meal. 70 mL 2  ?  insulin regular (NOVOLIN R RELION) 100 units/mL injection Inject 0.3 mLs (30 Units total) into the skin 3 (three) times daily before meals. 90 mL 2  ? Insulin Syringe-Needle U-100 (BD INSULIN SYRINGE U/F) 31G X 5/16" 0.3 ML MISC Use to inject insulin 5 times a day. 500 each 11  ? isosorbide mononitrate (IMDUR) 30 MG 24 hr tablet Take 1 tablet (30 mg total) by mouth daily. Patient must keep appointment for future refill 30 tablet 0  ? MAGNESIUM PO Take 1 tablet by mouth at bedtime.    ? Melatonin 10 MG TABS Take 10 mg by mouth at bedtime.    ? methocarbamol (ROBAXIN) 500 MG tablet Take 1 tablet (500 mg total) by mouth every 6 (six) hours as needed for muscle spasms. 30 tablet 3  ? metoprolol tartrate (LOPRESSOR) 25 MG  tablet TAKE 1 TABLET TWICE DAILY 180 tablet 1  ? nitroGLYCERIN (NITROSTAT) 0.4 MG SL tablet Place 1 tablet (0.4 mg total) under the tongue every 5 (five) minutes as needed for chest pain. 100 tablet 3  ? pantoprazole (PROTONIX) 20 MG tablet TAKE 1 TABLET EVERY DAY 90 tablet 3  ? Probiotic Product (PROBIOTIC PO) Take 1 capsule by mouth at bedtime.     ? silver sulfADIAZINE (SILVADENE) 1 % cream Apply 1 application. topically daily. 25 g 1  ? traMADol (ULTRAM) 50 MG tablet TAKE 1 TABLET BY MOUTH TWICE DAILY AS NEEDED FOR  MODERATE  OR  SEVERE  PAIN. 60 tablet 0  ? TRUE METRIX BLOOD GLUCOSE TEST test strip TEST BLOOD SUGAR THREE TIMES DAILY AS DIRECTED 300 strip 4  ? TRUEplus Lancets 33G MISC USE TO CHECK BLOOD SUGAR 3 TIMES A DAY 300 each 3  ? ?No current facility-administered medications for this visit.  ? ? ? ?Past Medical History:  ?Diagnosis Date  ? AAA (abdominal aortic aneurysm) ([place]) 12/15/2014  ? a. Mild aneurysmal dilatation of the infrarenal abdominal aorta 3.2 cm - f/u due by 2019.  ? Arthritis   ? "all over" (07/30/2016)  ? CAD (coronary artery disease)   ? a. stent to LAD 2000. b. possible spasm by cath 2001. c. IVUS/PTCA/DES to mLAD 05/2013. d. PTCA/DES of dRCA into ostial rPDA 07/2013. e. PTCA of OM2, DES to Cx, DES to [facility] 07/2016.  ? Chronic bronchitis ([place])   ? Chronic diastolic CHF (congestive heart failure) ([place])   ? Chronic lower back pain   ? Chronic neck pain   ? CKD (chronic kidney disease), stage III ([place])   ? Diabetic peripheral neuropathy ([place])   ? Ejection fraction   ? 55%, 07/2010, mild inferior hypo  ? GERD (gastroesophageal reflux disease)   ? Gout   ? H/O diverticulitis of colon 01/31/2015  ? H/O hiatal hernia   ? History of blood transfusion ~ 10/1940  ? "had pneumonia"  ? History of kidney stones   ? HTN (hypertension)   ? Hyperlipidemia   ? Melanoma of forearm, left ([place]) 02/2011  ? with wide excision   ? Myocardial infarction [facility])   ? Nephrolithiasis   ? Obesity   ? OSA on CPAP   ?   [name] since 2000  ? Peripheral neuropathy   ? both feet  ? Positive D-dimer   ? a. significant elevation ,hospital 07/2010, etiology unclear. b. D Dimer chronically > 20.  ? Renal insufficiency   ? Skin cancer   ? Spinal stenosis   ? a. s/p surgical repair 2013  ? Spinal stenosis  of lumbar region   ? Type II diabetes mellitus ([place])   ? Ventral hernia   ? ? ?Past Surgical History:  ?Procedure Laterality Date  ? ANTERIOR LAT LUMBAR FUSION  10/22/2017  ? Procedure: Lumbar two-three Lumbar three-four Lumbar four-five Anteriolateral lumbar interbody fusion with percutaneous pedicle screw fixation and infuse;  Surgeon: [name], MD;  Location: [place];  Service: Neurosurgery;;  ? APPLICATION OF ROBOTIC ASSISTANCE FOR SPINAL PROCEDURE  10/22/2017  ? Procedure: APPLICATION OF ROBOTIC ASSISTANCE FOR SPINAL PROCEDURE;  Surgeon: [name], MD;  Location: [place];  Service: Neurosurgery;;  ? BACK SURGERY    ? CARDIAC CATHETERIZATION N/A 07/30/2016  ? Procedure: Left Heart Cath and Coronary Angiography;  Surgeon: Nelva Bush, MD;  Location: [place] CV LAB;  Service: Cardiovascular;  Laterality: N/A;  ? CARDIAC CATHETERIZATION N/A 07/30/2016  ? Procedure: Coronary Stent Intervention;  Surgeon: Nelva Bush, MD;  Location: [place] CV LAB;  Service: Cardiovascular;  Laterality: N/A;  Mid CFX and MID RCA  ? CARDIAC CATHETERIZATION N/A 07/30/2016  ? Procedure: Coronary Balloon Angioplasty;  Surgeon: Nelva Bush, MD;  Location: [place] CV LAB;  Service: Cardiovascular;  Laterality: N/A;  OM 1  ? CARPAL TUNNEL RELEASE Left   ? CATARACT EXTRACTION W/ INTRAOCULAR LENS  IMPLANT, BILATERAL Bilateral ~ 2014  ? [place] SURGERY  1990s  ? CORONARY ANGIOPLASTY WITH STENT PLACEMENT  05/2013  ? CORONARY ANGIOPLASTY WITH STENT PLACEMENT  07/26/2013  ? DES to RCA extending to PDA    ? CORONARY ANGIOPLASTY WITH STENT PLACEMENT  07/30/2016  ? CORONARY ANGIOPLASTY WITH STENT PLACEMENT  2000  ? CAD  ? DENTAL SURGERY   04/2016  ? "got infected; had to dig it out"  ? EYE SURGERY    ? KNEE ARTHROSCOPY Right 1990's  ? rt  ? LEFT AND RIGHT HEART CATHETERIZATION WITH CORONARY ANGIOGRAM N/A 07/26/2013  ? Procedure: LEFT AND RIGHT HE

## 2022-02-06 NOTE — Telephone Encounter (Signed)
? ?  Name: William Hendrix  ?DOB: 1940-11-25  ?MRN: [id number] ? ?Primary Cardiologist: Kirk Ruths, MD ? ?Chart reviewed as part of pre-operative protocol coverage. Because of William Hendrix's past medical history and time since last visit, he will require a follow-up in-office visit in order to better assess preoperative cardiovascular risk. ? ?Pre-op covering staff: ?- Please schedule appointment and call patient to inform them. If patient already had an upcoming appointment within acceptable timeframe, please add "pre-op clearance" to the appointment notes so provider is aware. ?- Please contact requesting surgeon's office via preferred method (i.e, phone, fax) to inform them of need for appointment prior to surgery. ? ? ? ?Christell Faith, PA-C  ?02/06/2022, 8:26 AM  ? ?

## 2022-02-06 NOTE — Telephone Encounter (Signed)
Patient has an appointment scheduled to see Dr Stanford Breed on 02/18/2022 in the [facility] office. I spoke with patient and confirmed that his procedure has not been scheduled yet and he is okay with keeping this appointment as it is scheduled. Pre-op clearance added to appointment notes. Will fax back to requesting surgeons office to make them aware. ?

## 2022-02-07 ENCOUNTER — Other Ambulatory Visit: Payer: Self-pay | Admitting: Cardiology

## 2022-02-10 ENCOUNTER — Other Ambulatory Visit: Payer: Self-pay | Admitting: Cardiology

## 2022-02-10 ENCOUNTER — Other Ambulatory Visit: Payer: Self-pay | Admitting: Family Medicine

## 2022-02-10 DIAGNOSIS — R072 Precordial pain: Secondary | ICD-10-CM

## 2022-02-18 ENCOUNTER — Other Ambulatory Visit (HOSPITAL_BASED_OUTPATIENT_CLINIC_OR_DEPARTMENT_OTHER): Payer: Self-pay | Admitting: Neurological Surgery

## 2022-02-18 ENCOUNTER — Encounter: Payer: Self-pay | Admitting: Cardiology

## 2022-02-18 ENCOUNTER — Ambulatory Visit: Payer: Medicare HMO | Admitting: Cardiology

## 2022-02-18 VITALS — BP 124/60 | HR 63 | Ht 70.0 in | Wt 231.4 lb

## 2022-02-18 DIAGNOSIS — Z01810 Encounter for preprocedural cardiovascular examination: Secondary | ICD-10-CM | POA: Diagnosis not present

## 2022-02-18 DIAGNOSIS — I714 Abdominal aortic aneurysm, without rupture, unspecified: Secondary | ICD-10-CM

## 2022-02-18 DIAGNOSIS — E7800 Pure hypercholesterolemia, unspecified: Secondary | ICD-10-CM | POA: Diagnosis not present

## 2022-02-18 DIAGNOSIS — I10 Essential (primary) hypertension: Secondary | ICD-10-CM

## 2022-02-18 DIAGNOSIS — I2510 Atherosclerotic heart disease of native coronary artery without angina pectoris: Secondary | ICD-10-CM | POA: Diagnosis not present

## 2022-02-18 DIAGNOSIS — M5416 Radiculopathy, lumbar region: Secondary | ICD-10-CM

## 2022-02-18 NOTE — Patient Instructions (Signed)

## 2022-02-19 ENCOUNTER — Encounter: Payer: Self-pay | Admitting: Internal Medicine

## 2022-02-19 ENCOUNTER — Telehealth (HOSPITAL_BASED_OUTPATIENT_CLINIC_OR_DEPARTMENT_OTHER): Payer: Self-pay

## 2022-02-19 ENCOUNTER — Ambulatory Visit: Payer: Medicare HMO | Admitting: Internal Medicine

## 2022-02-19 VITALS — BP 130/64 | HR 73 | Ht 70.0 in | Wt 235.0 lb

## 2022-02-19 DIAGNOSIS — E1159 Type 2 diabetes mellitus with other circulatory complications: Secondary | ICD-10-CM

## 2022-02-19 DIAGNOSIS — Z794 Long term (current) use of insulin: Secondary | ICD-10-CM | POA: Diagnosis not present

## 2022-02-19 DIAGNOSIS — Z6837 Body mass index (BMI) 37.0-37.9, adult: Secondary | ICD-10-CM | POA: Diagnosis not present

## 2022-02-19 DIAGNOSIS — E785 Hyperlipidemia, unspecified: Secondary | ICD-10-CM | POA: Diagnosis not present

## 2022-02-19 LAB — POCT GLYCOSYLATED HEMOGLOBIN (HGB A1C): Hemoglobin A1C: 6.5 % — AB (ref 4.0–5.6)

## 2022-02-19 NOTE — Progress Notes (Signed)
Patient ID: William Hendrix, male   DOB: 1941-09-10, 81 y.o.   MRN: [id number] ? ?This visit occurred during the SARS-CoV-2 public health emergency.  Safety protocols were in place, including screening questions prior to the visit, additional usage of staff PPE, and extensive cleaning of exam room while observing appropriate contact time as indicated for disinfecting solutions.  ? ?HPI: ?William Hendrix is a 81 y.o.-year-old male, returning for f/u for DM2, dx 2009, insulin-dependent, uncontrolled, with complications (CAD s/p AMI 2017, CKD stage 3). Last visit 4 months ago. ? ?Interim history: ?No increased urination, blurry vision, nausea, chest pain.   ?He had lumbar fusion surgery on 09/11/2021.  His sugars are higher after the surgery, before last visit but they started to improve afterwards. ?He needs clearance for right knee for cyst excision.  I kind the form today and we will fax it over to Dr. Edmonia Lynch. ? ?Reviewed HbA1c levels: ?Lab Results  ?Component Value Date  ? HGBA1C 7.1 (H) 09/09/2021  ? HGBA1C 6.0 (A) 05/27/2021  ? HGBA1C 6.7 (A) 01/14/2021  ? HGBA1C 6.2 (A) 09/10/2020  ? HGBA1C 6.7 (A) 04/30/2020  ? HGBA1C 8.0 (H) 11/21/2019  ? HGBA1C 7.2 (H) 08/14/2019  ? HGBA1C 6.7 (H) 05/11/2019  ? HGBA1C 6.3 (A) 04/27/2019  ? HGBA1C 6.8 (H) 12/15/2018  ? HGBA1C 6.1 (A) 05/10/2018  ? HGBA1C 5.7 01/06/2018  ? HGBA1C 6.3 09/09/2017  ? HGBA1C 7.3 06/09/2017  ? HGBA1C 7.5 03/02/2017  ? HGBA1C 7.7 11/26/2016  ? HGBA1C 6.5 08/28/2016  ? HGBA1C 7.0 05/28/2016  ? HGBA1C 7.8 02/26/2016  ? HGBA1C 7.9 11/28/2015  ? ?He is on: ?ReliOn insulin: ? ?Insulin Before breakfast Before lunch Before dinner  ?Regular 30 >> 25-30 30 >> 25-30  ?NPH 40  40  ? ?If the sugars before the meal are: ?<80: skip the R insulin before that meal ?80-100: take only 10 units of R insulin ?101-120: take only 15 units of R insulin  ?Otherwise, take the full 30 units dose. ? ?We cannot use Metformin 2/2 CKD. ?We stopped Januvia >> could not  afford ($100/3 mo)  ?-We cannot use Levemir >> developed a severe skin reaction ?We stopped Amaryl 4 mg. ?- ?He checks sugars 3 times a day per review of his log: ?- am: 53, 65-137, 149, 210  >> 87-154, 172 >> 62, 72-149, 163, 295 >> 57-167 ?- Before lunch:  96, 106-160 >> 76-143 >> 122-146, 174 >>  after brunch: 69-177, 198 ?- before dinner: 108-203, 220, 326 >> 97-189, 201, 238 >> 69-218, 244 >> 48, 69, 92-186, 214 ?- Before bedtime: 180-201 >> n/c >> 127-173, 207 >> n/c  >> 101 ?- nighttime: 44 x1  ?Lowest sugar was 53 (am) >> 87 >> 44 at night >> 48; it is unclear at which level he has hypoglycemia awareness. ?Highest sugar was 326 >> 238 >> 295 >> 214. ? ?In 2018, he eliminated meat almost completely and his sugars improved significantly while he also lost weight.  He continues to eat many vegetables, but he reintroduced meat, bacon, eggs and sugars started to increase. ? ?+ CKD, last BUN/creatinine was:  ?Lab Results  ?Component Value Date  ? BUN 20 10/27/2021  ? CREATININE 1.43 10/27/2021  ? ?ACR levels were normal: ?Lab Results  ?Component Value Date  ? MICRALBCREAT 1.8 10/27/2021  ? MICRALBCREAT 5.1 08/02/2014  ? MICRALBCREAT 0.7 02/23/2013  ? ?Reviewed GFR levels: ?Lab Results  ?Component Value Date  ? GFRAA 50 (L) 08/28/2020  ?  GFRAA 48 (L) 01/09/2020  ? GFRAA 49 (L) 12/13/2019  ? GFRAA 48 (L) 04/22/2019  ? GFRAA 44 (L) 12/29/2018  ? GFRAA 39 (L) 11/12/2018  ? GFRAA 32 (L) 11/11/2018  ? GFRAA 44 (L) 09/16/2018  ? GFRAA 43 (L) 09/15/2018  ? GFRAA 46 (L) 08/21/2018  ? ?+ HL; latest lipids: ?Lab Results  ?Component Value Date  ? CHOL 108 10/27/2021  ? HDL 23.30 (L) 10/27/2021  ? LDLCALC 45 10/27/2021  ? LDLDIRECT 57.0 11/21/2019  ? TRIG 199.0 (H) 10/27/2021  ? CHOLHDL 5 10/27/2021  ?On Lipitor, fenofibrate, flaxseed oil. ? ?Pt's last eye exam was 11/2021: No DR reportedly. Had cataract sx in 2011. ? ?+ numbness and tingling in his legs. ? ?He had back surgery in 03/2016 and 10/2017.  He developed a kidney  stone after his last surgery. ?He was admitted 2x in 2017 for: CP/SOB >> AMI.  He had 2 stents placed then. ?He had a cardiac cath 02/2017 >> no new blockages ?He was seen emergency room with colitis 04/22/2019.   ? ?PMH: ?Pt also also has a history of CAD, HTN, CKD stage III, history of nephrolithiasis, hiatal hernia, OSA, obesity, melanoma, spinal stenosis. ? ?ROS: ?+ see HPI ?Musculoskeletal: + muscle aches/+ joint aches (back) ?Neurological: + tremors/+ numbness/+ tingling/no dizziness ? ?I reviewed pt's medications, allergies, PMH, social hx, family hx, and changes were documented in the history of present illness. Otherwise, unchanged from my initial visit note. ? ?Past Medical History:  ?Diagnosis Date  ? AAA (abdominal aortic aneurysm) ([place]) 12/15/2014  ? a. Mild aneurysmal dilatation of the infrarenal abdominal aorta 3.2 cm - f/u due by 2019.  ? Arthritis   ? "all over" (07/30/2016)  ? CAD (coronary artery disease)   ? a. stent to LAD 2000. b. possible spasm by cath 2001. c. IVUS/PTCA/DES to mLAD 05/2013. d. PTCA/DES of dRCA into ostial rPDA 07/2013. e. PTCA of OM2, DES to Cx, DES to [facility] 07/2016.  ? Chronic bronchitis ([place])   ? Chronic diastolic CHF (congestive heart failure) ([place])   ? Chronic lower back pain   ? Chronic neck pain   ? CKD (chronic kidney disease), stage III ([place])   ? Diabetic peripheral neuropathy ([place])   ? Ejection fraction   ? 55%, 07/2010, mild inferior hypo  ? GERD (gastroesophageal reflux disease)   ? Gout   ? H/O diverticulitis of colon 01/31/2015  ? H/O hiatal hernia   ? History of blood transfusion ~ 10/1940  ? "had pneumonia"  ? History of kidney stones   ? HTN (hypertension)   ? Hyperlipidemia   ? Melanoma of forearm, left ([place]) 02/2011  ? with wide excision   ? Myocardial infarction [facility])   ? Nephrolithiasis   ? Obesity   ? OSA on CPAP   ?  [name] since 2000  ? Peripheral neuropathy   ? both feet  ? Positive D-dimer   ? a. significant elevation ,hospital 07/2010, etiology unclear.  b. D Dimer chronically > 20.  ? Renal insufficiency   ? Skin cancer   ? Spinal stenosis   ? a. s/p surgical repair 2013  ? Spinal stenosis of lumbar region   ? Type II diabetes mellitus ([place])   ? Ventral hernia   ? ?Past Surgical History:  ?Procedure Laterality Date  ? ANTERIOR LAT LUMBAR FUSION  10/22/2017  ? Procedure: Lumbar two-three Lumbar three-four Lumbar four-five Anteriolateral lumbar interbody fusion with percutaneous pedicle screw fixation and infuse;  Surgeon: [name], MD;  Location: [place];  Service: Neurosurgery;;  ? APPLICATION OF ROBOTIC ASSISTANCE FOR SPINAL PROCEDURE  10/22/2017  ? Procedure: APPLICATION OF ROBOTIC ASSISTANCE FOR SPINAL PROCEDURE;  Surgeon: [name], MD;  Location: [place];  Service: Neurosurgery;;  ? BACK SURGERY    ? CARDIAC CATHETERIZATION N/A 07/30/2016  ? Procedure: Left Heart Cath and Coronary Angiography;  Surgeon: Nelva Bush, MD;  Location: [place] CV LAB;  Service: Cardiovascular;  Laterality: N/A;  ? CARDIAC CATHETERIZATION N/A 07/30/2016  ? Procedure: Coronary Stent Intervention;  Surgeon: Nelva Bush, MD;  Location: [place] CV LAB;  Service: Cardiovascular;  Laterality: N/A;  Mid CFX and MID RCA  ? CARDIAC CATHETERIZATION N/A 07/30/2016  ? Procedure: Coronary Balloon Angioplasty;  Surgeon: Nelva Bush, MD;  Location: [place] CV LAB;  Service: Cardiovascular;  Laterality: N/A;  OM 1  ? CARPAL TUNNEL RELEASE Left   ? CATARACT EXTRACTION W/ INTRAOCULAR LENS  IMPLANT, BILATERAL Bilateral ~ 2014  ? [place] [facility]  1990s  ? CORONARY ANGIOPLASTY WITH STENT PLACEMENT  05/2013  ? CORONARY ANGIOPLASTY WITH STENT PLACEMENT  07/26/2013  ? DES to RCA extending to PDA    ? CORONARY ANGIOPLASTY WITH STENT PLACEMENT  07/30/2016  ? CORONARY ANGIOPLASTY WITH STENT PLACEMENT  2000  ? CAD  ? DENTAL SURGERY  04/2016  ? "got infected; had to dig it out"  ? EYE SURGERY    ? KNEE ARTHROSCOPY Right 1990's  ? rt  ? LEFT AND RIGHT HEART CATHETERIZATION WITH  CORONARY ANGIOGRAM N/A 07/26/2013  ? Procedure: LEFT AND RIGHT HEART CATHETERIZATION WITH CORONARY ANGIOGRAM;  Surgeon: [name], MD;  Location: [place] CATH LAB;  Service: Cardiovascular;  Laterality: N/A

## 2022-02-19 NOTE — Patient Instructions (Addendum)
Please change: ?Insulin Before breakfast Before lunch Before dinner  ?Regular 25-30 25-30 25-30  ?NPH 40 >> 34  40 >> 34  ? ?If the sugars before the meal are: ?<80: skip the R insulin before that meal ?80-100: take only 10 units of R insulin ?101-120: take only 15 units of R insulin  ?Otherwise, take the full 30 units dose. ? ?If you take dinnertime insulin after dinner, take 50% of the dose. ? ?The night before the surgery, take: ?- 25 units of NPH insulin ?- 15 units of R insulin ?Skip insulin the am of the surgery. ? ?Please return in 4 months with your sugar log.  ?

## 2022-02-20 ENCOUNTER — Telehealth (HOSPITAL_BASED_OUTPATIENT_CLINIC_OR_DEPARTMENT_OTHER): Payer: Self-pay

## 2022-02-23 ENCOUNTER — Ambulatory Visit (HOSPITAL_BASED_OUTPATIENT_CLINIC_OR_DEPARTMENT_OTHER)
Admission: RE | Admit: 2022-02-23 | Discharge: 2022-02-23 | Disposition: A | Discharge status: Home / Self Care | Payer: Medicare HMO | Source: Ambulatory Visit | Attending: Neurological Surgery | Admitting: Neurological Surgery

## 2022-02-23 DIAGNOSIS — M545 Low back pain, unspecified: Secondary | ICD-10-CM | POA: Diagnosis not present

## 2022-02-23 DIAGNOSIS — M5416 Radiculopathy, lumbar region: Secondary | ICD-10-CM | POA: Diagnosis not present

## 2022-03-05 ENCOUNTER — Other Ambulatory Visit: Payer: Self-pay | Admitting: Cardiology

## 2022-03-05 ENCOUNTER — Encounter: Payer: Self-pay | Admitting: Internal Medicine

## 2022-03-10 ENCOUNTER — Telehealth: Payer: Medicare HMO

## 2022-03-12 DIAGNOSIS — M66251 Spontaneous rupture of extensor tendons, right thigh: Secondary | ICD-10-CM | POA: Diagnosis not present

## 2022-03-12 DIAGNOSIS — M7121 Synovial cyst of popliteal space [Baker], right knee: Secondary | ICD-10-CM | POA: Diagnosis not present

## 2022-03-12 DIAGNOSIS — M67461 Ganglion, right knee: Secondary | ICD-10-CM | POA: Diagnosis not present

## 2022-03-25 DIAGNOSIS — Z6834 Body mass index (BMI) 34.0-34.9, adult: Secondary | ICD-10-CM | POA: Diagnosis not present

## 2022-03-25 DIAGNOSIS — M5416 Radiculopathy, lumbar region: Secondary | ICD-10-CM | POA: Diagnosis not present

## 2022-04-15 ENCOUNTER — Encounter: Payer: Self-pay | Admitting: Family Medicine

## 2022-04-23 ENCOUNTER — Other Ambulatory Visit: Payer: Self-pay | Admitting: Cardiology

## 2022-04-23 DIAGNOSIS — R072 Precordial pain: Secondary | ICD-10-CM

## 2022-05-15 ENCOUNTER — Telehealth: Payer: Self-pay

## 2022-05-15 NOTE — Patient Outreach (Signed)
  Care Coordination   05/15/2022 Name: William Hendrix MRN: [id number] DOB: 09/20/1941   Care Coordination Outreach Attempts:  An unsuccessful telephone outreach was attempted today to offer the patient information about available care coordination services as a benefit of their health plan.   Follow Up Plan:  Additional outreach attempts will be made to offer the patient care coordination information and services.   Encounter Outcome:  No Answer  Care Coordination Interventions Activated:  No   Care Coordination Interventions:  No, not indicated    Thea Silversmith, RN, MSN, BSN, CCM Care Coordinator [phone]

## 2022-06-30 ENCOUNTER — Ambulatory Visit: Payer: Medicare HMO | Admitting: Internal Medicine

## 2022-06-30 ENCOUNTER — Encounter: Payer: Self-pay | Admitting: Internal Medicine

## 2022-06-30 VITALS — BP 118/60 | HR 65 | Ht 70.0 in | Wt 233.8 lb

## 2022-06-30 DIAGNOSIS — Z794 Long term (current) use of insulin: Secondary | ICD-10-CM | POA: Diagnosis not present

## 2022-06-30 DIAGNOSIS — Z6837 Body mass index (BMI) 37.0-37.9, adult: Secondary | ICD-10-CM | POA: Diagnosis not present

## 2022-06-30 DIAGNOSIS — E1159 Type 2 diabetes mellitus with other circulatory complications: Secondary | ICD-10-CM

## 2022-06-30 DIAGNOSIS — E785 Hyperlipidemia, unspecified: Secondary | ICD-10-CM

## 2022-06-30 LAB — POCT GLYCOSYLATED HEMOGLOBIN (HGB A1C): Hemoglobin A1C: 6.1 % — AB (ref 4.0–5.6)

## 2022-06-30 NOTE — Patient Instructions (Signed)
Please use the following regimen: Insulin Before breakfast Before lunch Before dinner  Regular 25-30 25-30 25-30  NPH 34  34   If the sugars before the meal are: <80: skip the R insulin before that meal 80-100: take only 10 units of R insulin 101-120: take only 15 units of R insulin  Otherwise, take the full 30 units dose.  If you take dinnertime insulin after dinner, take 50% of the dose.  Please return in 4 months with your sugar log.

## 2022-06-30 NOTE — Progress Notes (Signed)
Patient ID: William Hendrix, male   DOB: [birth year], 81 y.o.   MRN: [id number] .  HPI: William Hendrix is a 81 y.o.-year-old male, returning for f/u for DM2, dx 2009, insulin-dependent, uncontrolled, with complications (CAD s/p AMI 2017, CKD stage 3). Last visit 4 months ago.  Interim history: No increased urination, blurry vision, nausea, chest pain.  She continues to have back pain and has difficulty walking. He was up all all night at the ED with his wife >> kidney stones and UTI. She has dementia. He had a R knee cyst surgery in 03/2022.  Reviewed HbA1c levels: Lab Results  Component Value Date   HGBA1C 6.5 (A) 02/19/2022   HGBA1C 7.1 (H) 09/09/2021   HGBA1C 6.0 (A) 05/27/2021   HGBA1C 6.7 (A) 01/14/2021   HGBA1C 6.2 (A) 09/10/2020   HGBA1C 6.7 (A) 04/30/2020   HGBA1C 8.0 (H) 11/21/2019   HGBA1C 7.2 (H) 08/14/2019   HGBA1C 6.7 (H) 05/11/2019   HGBA1C 6.3 (A) 04/27/2019   HGBA1C 6.8 (H) 12/15/2018   HGBA1C 6.1 (A) 05/10/2018   HGBA1C 5.7 01/06/2018   HGBA1C 6.3 09/09/2017   HGBA1C 7.3 06/09/2017   HGBA1C 7.5 03/02/2017   HGBA1C 7.7 11/26/2016   HGBA1C 6.5 08/28/2016   HGBA1C 7.0 05/28/2016   HGBA1C 7.8 02/26/2016   He is on: ReliOn insulin: Insulin Before breakfast Before lunch Before dinner  Regular 25-30 25-30 25-30  NPH 40 >> 34  40 >> 34   If the sugars before the meal are: <80: skip the R insulin before that meal 80-100: take only 10 units of R insulin 101-120: take only 15 units of R insulin  Otherwise, take the full 30 units dose.  We cannot use Metformin 2/2 CKD. We stopped Januvia >> could not afford ($100/3 mo)  -We cannot use Levemir >> developed a severe skin reaction We stopped Amaryl 4 mg. - He checks sugars 3 times a day per review of his log: - am:  87-154, 172 >> 62, 72-149, 163, 295 >> 57-167 >> 74-154, 161 - Before lunch: 122-146, 174 >>  after brunch: 69-177, 198 >> 115-139, 146 - before dinner: 69-218, 244 >> 48, 69, 92-186, 214 >> 97-189,  210 - Before bedtime: 180-201 >> n/c >> 127-173, 207 >> n/c  >> 101 >> n/c - nighttime: 44 x1  Lowest sugar was 44 at night >> 48 >> 74; it is unclear at which level he has hypoglycemia awareness. Highest sugar was 326 >> .Marland Kitchen.214 >> 210.  In 2018, he eliminated meat almost completely and his sugars improved significantly while he also lost weight.  He continues to eat many vegetables, but he reintroduced meat, bacon, eggs and sugars started to increase.  + CKD, last BUN/creatinine was:  Lab Results  Component Value Date   BUN 20 10/27/2021   CREATININE 1.43 10/27/2021   ACR levels were normal: Lab Results  Component Value Date   MICRALBCREAT 1.8 10/27/2021   MICRALBCREAT 5.1 08/02/2014   MICRALBCREAT 0.7 02/23/2013   Reviewed GFR levels: Lab Results  Component Value Date   GFRAA 50 (L) 08/28/2020   GFRAA 48 (L) 01/09/2020   GFRAA 49 (L) 12/13/2019   GFRAA 48 (L) 04/22/2019   GFRAA 44 (L) 12/29/2018   GFRAA 39 (L) 11/12/2018   GFRAA 32 (L) 11/11/2018   GFRAA 44 (L) 09/16/2018   GFRAA 43 (L) 09/15/2018   GFRAA 46 (L) 08/21/2018   + HL; latest lipids: Lab Results  Component Value Date  CHOL 108 10/27/2021   HDL 23.30 (L) 10/27/2021   LDLCALC 45 10/27/2021   LDLDIRECT 57.0 11/21/2019   TRIG 199.0 (H) 10/27/2021   CHOLHDL 5 10/27/2021  On Lipitor, fenofibrate, flaxseed oil.  Pt's last eye exam was 10/2021: No DR reportedly. Had cataract sx in 2011.  + numbness and tingling in his legs.  He had back surgery in 03/2016 and 10/2017.  He had lumbar fusion surgery on 09/11/2021. Total: 9 back surgeries. He was admitted 2x in 2017 for: CP/SOB >> AMI.  He had 2 stents placed then. He had a cardiac cath 02/2017 >> no new blockages He was seen emergency room with colitis 04/22/2019.    PMH: Pt also also has a history of CAD, HTN, CKD stage III, history of nephrolithiasis, hiatal hernia, OSA, obesity, melanoma, spinal stenosis.   ROS: + see HPI Musculoskeletal: + muscle  aches/+ joint aches (back) Neurological: + tremors/+ numbness/+ tingling/no dizziness  I reviewed pt's medications, allergies, PMH, social hx, family hx, and changes were documented in the history of present illness. Otherwise, unchanged from my initial visit note.  Past Medical History:  Diagnosis Date   AAA (abdominal aortic aneurysm) ([place]) 12/15/2014   a. Mild aneurysmal dilatation of the infrarenal abdominal aorta 3.2 cm - f/u due by 2019.   Arthritis    "all over" (07/30/2016)   CAD (coronary artery disease)    a. stent to LAD 2000. b. possible spasm by cath 2001. c. IVUS/PTCA/DES to mLAD 05/2013. d. PTCA/DES of dRCA into ostial rPDA 07/2013. e. PTCA of OM2, DES to Cx, DES to [facility] 07/2016.   Chronic bronchitis (HCC)    Chronic diastolic CHF (congestive heart failure) (HCC)    Chronic lower back pain    Chronic neck pain    CKD (chronic kidney disease), stage III (HCC)    Diabetic peripheral neuropathy ([place])    Ejection fraction    55%, 07/2010, mild inferior hypo   GERD (gastroesophageal reflux disease)    Gout    H/O diverticulitis of colon 01/31/2015   H/O hiatal hernia    History of blood transfusion ~ 10/1940   "had pneumonia"   History of kidney stones    HTN (hypertension)    Hyperlipidemia    Melanoma of forearm, left ([place]) 02/2011   with wide excision    Myocardial infarction ([place])    Nephrolithiasis    Obesity    OSA on CPAP     [name] since 2000   Peripheral neuropathy    both feet   Positive D-dimer    a. significant elevation ,hospital 07/2010, etiology unclear. b. D Dimer chronically > 20.   Renal insufficiency    Skin cancer    Spinal stenosis    a. s/p surgical repair 2013   Spinal stenosis of lumbar region    Type II diabetes mellitus ([place])    Ventral hernia    Past Surgical History:  Procedure Laterality Date   ANTERIOR LAT LUMBAR FUSION  10/22/2017   Procedure: Lumbar two-three Lumbar three-four Lumbar four-five Anteriolateral lumbar interbody  fusion with percutaneous pedicle screw fixation and infuse;  Surgeon: [name], MD;  Location: [place];  Service: Neurosurgery;;   APPLICATION OF ROBOTIC ASSISTANCE FOR SPINAL PROCEDURE  10/22/2017   Procedure: APPLICATION OF ROBOTIC ASSISTANCE FOR SPINAL PROCEDURE;  Surgeon: [name], MD;  Location: [place];  Service: Neurosurgery;;   BACK SURGERY     CARDIAC CATHETERIZATION N/A 07/30/2016   Procedure: Left Heart Cath and  Coronary Angiography;  Surgeon: Nelva Bush, MD;  Location: [place] CV LAB;  Service: Cardiovascular;  Laterality: N/A;   CARDIAC CATHETERIZATION N/A 07/30/2016   Procedure: Coronary Stent Intervention;  Surgeon: Nelva Bush, MD;  Location: [place] CV LAB;  Service: Cardiovascular;  Laterality: N/A;  Mid CFX and MID RCA   CARDIAC CATHETERIZATION N/A 07/30/2016   Procedure: Coronary Balloon Angioplasty;  Surgeon: Nelva Bush, MD;  Location: [place] CV LAB;  Service: Cardiovascular;  Laterality: N/A;  OM 1   CARPAL TUNNEL RELEASE Left    CATARACT EXTRACTION W/ INTRAOCULAR LENS  IMPLANT, BILATERAL Bilateral ~ 2014   CERVICAL DISC SURGERY  1990s   CORONARY ANGIOPLASTY WITH STENT PLACEMENT  05/2013   CORONARY ANGIOPLASTY WITH STENT PLACEMENT  07/26/2013   DES to RCA extending to PDA     CORONARY ANGIOPLASTY WITH STENT PLACEMENT  07/30/2016   CORONARY ANGIOPLASTY WITH STENT PLACEMENT  2000   CAD   DENTAL SURGERY  04/2016   "got infected; had to dig it out"   EYE SURGERY     KNEE ARTHROSCOPY Right 1990's   rt   LEFT AND RIGHT HEART CATHETERIZATION WITH CORONARY ANGIOGRAM N/A 07/26/2013   Procedure: LEFT AND RIGHT HEART CATHETERIZATION WITH CORONARY ANGIOGRAM;  Surgeon: [name], MD;  Location: [place] CATH LAB;  Service: Cardiovascular;  Laterality: N/A;   LEFT HEART CATH AND CORONARY ANGIOGRAPHY N/A 02/26/2017   Procedure: Left Heart Cath and Coronary Angiography;  Surgeon: Sherren Mocha, MD;  Location: [place] CV LAB;  Service:  Cardiovascular;  Laterality: N/A;   LEFT HEART CATHETERIZATION WITH CORONARY ANGIOGRAM N/A 05/19/2013   Procedure: LEFT HEART CATHETERIZATION WITH CORONARY ANGIOGRAM;  Surgeon: [name], MD;  Location: [facility] CATH LAB;  Service: Cardiovascular;  Laterality: N/A;   LUMBAR LAMINECTOMY/DECOMPRESSION MICRODISCECTOMY  08/30/2012   Procedure: LUMBAR LAMINECTOMY/DECOMPRESSION MICRODISCECTOMY 2 LEVELS;  Surgeon: [name], MD;  Location: [place] NEURO ORS;  Service: Neurosurgery;  Laterality: Bilateral;  Bilateral Lumbar three-four Lumbar four-five Laminotomies   LUMBAR PERCUTANEOUS PEDICLE SCREW 1 LEVEL Bilateral 10/22/2017   Procedure: LUMBAR PERCUTANEOUS PEDICLE SCREW 1 LEVEL;  Surgeon: [name], MD;  Location: [place];  Service: Neurosurgery;  Laterality: Bilateral;   LUMBAR SPINE SURGERY  04/07/2016   Dr. Ellene Route; "?ruptured disc"   MELANOMA EXCISION Left 02/2011   forearm   NASAL SINUS SURGERY  1970s   "cut windows in sinus pockets"   PERCUTANEOUS CORONARY STENT INTERVENTION (PCI-S)  05/19/2013   Procedure: PERCUTANEOUS CORONARY STENT INTERVENTION (PCI-S);  Surgeon: [name], MD;  Location: [facility] CATH LAB;  Service: Cardiovascular;;   ULNAR TUNNEL RELEASE Left 04/07/2016   Dr. Ellene Route   Social History   Socioeconomic History   Marital status: Married    Spouse name: Not on file   Number of children: Not on file   Years of education: Not on file   Highest education level: Not on file  Occupational History   Occupation: Curator  Tobacco Use   Smoking status: Former    Packs/day: 3.00    Years: 30.00    Total pack years: 90.00    Types: Cigarettes    Quit date: 09/17/1996    Years since quitting: 25.8   Smokeless tobacco: Never  Vaping Use   Vaping Use: Never used  Substance and Sexual Activity   Alcohol use: No   Drug use: No   Sexual activity: Not on file  Other Topics Concern   Not on file  Social History Narrative  Married and lives locally with his wife.  Sharyon Cable.    Social Determinants of Health   Financial Resource Strain: Low Risk  (08/29/2021)   Overall Financial Resource Strain (CARDIA)    Difficulty of Paying Living Expenses: Not hard at all  Food Insecurity: Not on file  Transportation Needs: No Transportation Needs (06/11/2021)   PRAPARE - Hydrologist (Medical): No    Lack of Transportation (Non-Medical): No  Physical Activity: Inactive (12/01/2021)   Exercise Vital Sign    Days of Exercise per Week: 0 days    Minutes of Exercise per Session: 0 min  Stress: Not on file  Social Connections: Not on file  Intimate Partner Violence: Not on file   Current Outpatient Medications on File Prior to Visit  Medication Sig Dispense Refill   acetaminophen (TYLENOL) 500 MG tablet Take 500 mg by mouth 2 (two) times daily.      alfuzosin (UROXATRAL) 10 MG 24 hr tablet Take 10 mg by mouth in the morning and at bedtime.     allopurinol (ZYLOPRIM) 300 MG tablet TAKE 1 TABLET EVERY DAY 90 tablet 1   amLODipine (NORVASC) 5 MG tablet TAKE 1 TABLET EVERY DAY 90 tablet 3   aspirin EC 81 MG tablet Take 81 mg by mouth daily.     atorvastatin (LIPITOR) 40 MG tablet TAKE 1 TABLET EVERY DAY 90 tablet 3   B Complex Vitamins (B COMPLEX-B12 PO) Take 1 tablet by mouth daily.      baclofen (LIORESAL) 10 MG tablet 1 tab po q hs prn  back spasms 60 each 0   Blood Glucose Monitoring Suppl (TRUE METRIX METER) w/Device KIT USE AS DIRECTED  TO TEST BLOOD GLUCOSE 1 kit 0   Chlorpheniramine Maleate (CHLOR-TRIMETON ALLERGY PO) Take 1 tablet by mouth in the morning and at bedtime.     Cholecalciferol (VITAMIN D3) 50 MCG (2000 UT) capsule Take 2,000 Units by mouth at bedtime.      doxycycline (VIBRA-TABS) 100 MG tablet Take 1 tablet (100 mg total) by mouth 2 (two) times daily. 20 tablet 0   doxycycline (VIBRA-TABS) 100 MG tablet Take 1 tablet (100 mg total) by mouth 2 (two) times daily. 6 tablet 0   DROPLET INSULIN SYRINGE 31G X 5/16" 1 ML MISC USE TO  INJECT INSULIN 5 TIMES DAILY 500 each 0   famotidine (PEPCID) 40 MG tablet Take 1 tablet (40 mg total) by mouth at bedtime. 30 tablet 2   fenofibrate 160 MG tablet TAKE 1 TABLET AT BEDTIME 90 tablet 1   Ferrous Fumarate (HEMOCYTE) 324 (106 Fe) MG TABS tablet Take 1 tablet (106 mg of iron total) by mouth daily. 30 tablet 2   fluocinonide cream (LIDEX) 0.08 % Apply 1 application topically daily as needed (for rashes).      fluticasone (FLONASE) 50 MCG/ACT nasal spray Place 2 sprays into both nostrils daily. (Patient taking differently: Place 2 sprays into both nostrils daily as needed for allergies.) 16 g 1   furosemide (LASIX) 40 MG tablet TAKE 2 TABLETS (80 MG) DAILY. 180 tablet 3   HYDROcodone-acetaminophen (NORCO) 5-325 MG tablet Take 0.5-1 tablets by mouth 2 (two) times daily as needed for moderate pain. 60 tablet 0   insulin NPH Human (NOVOLIN N) 100 UNIT/ML injection Inject 0.4 mLs (40 Units total) into the skin 2 (two) times daily before a meal. 70 mL 2   insulin regular (NOVOLIN R RELION) 100 units/mL injection Inject 0.3 mLs (30 Units  total) into the skin 3 (three) times daily before meals. 90 mL 2   Insulin Syringe-Needle U-100 (BD INSULIN SYRINGE U/F) 31G X 5/16" 0.3 ML MISC Use to inject insulin 5 times a day. 500 each 11   isosorbide mononitrate (IMDUR) 30 MG 24 hr tablet Take 1 tablet (30 mg total) by mouth daily. 60 tablet 1   MAGNESIUM PO Take 1 tablet by mouth at bedtime.     Melatonin 10 MG TABS Take 10 mg by mouth at bedtime.     methocarbamol (ROBAXIN) 500 MG tablet Take 1 tablet (500 mg total) by mouth every 6 (six) hours as needed for muscle spasms. 30 tablet 3   metoprolol tartrate (LOPRESSOR) 25 MG tablet TAKE 1 TABLET TWICE DAILY 180 tablet 2   nitroGLYCERIN (NITROSTAT) 0.4 MG SL tablet Place 1 tablet (0.4 mg total) under the tongue every 5 (five) minutes as needed for chest pain. 100 tablet 3   pantoprazole (PROTONIX) 20 MG tablet TAKE 1 TABLET EVERY DAY 90 tablet 3    Probiotic Product (PROBIOTIC PO) Take 1 capsule by mouth at bedtime.      silver sulfADIAZINE (SILVADENE) 1 % cream Apply 1 application. topically daily. 25 g 1   traMADol (ULTRAM) 50 MG tablet TAKE 1 TABLET BY MOUTH TWICE DAILY AS NEEDED FOR  MODERATE  OR  SEVERE  PAIN. 60 tablet 0   TRUE METRIX BLOOD GLUCOSE TEST test strip TEST BLOOD SUGAR THREE TIMES DAILY AS DIRECTED 300 strip 4   TRUEplus Lancets 33G MISC USE TO CHECK BLOOD SUGAR 3 TIMES A DAY 300 each 3   No current facility-administered medications on file prior to visit.   Allergies  Allergen Reactions   Brilinta [Ticagrelor] Shortness Of Breath   Insulin Detemir Swelling, Other (See Comments) and Itching    Patient had redness and swelling and tenderness at injection site. Patient had redness and swelling and tenderness at injection site.   Bee Venom    Codeine Other (See Comments)    Makes him "shakey", is OK with hydrocodone GI UPSET & TREMORS Makes him "shakey", is OK with hydrocodone Makes him "shakey", is OK with hydrocodone GI UPSET & TREMORS Other reaction(s): GI issues   Gadobenate     Other reaction(s): fever, sweats and nausea   Insulin Aspart    Lipitor [Atorvastatin] Other (See Comments)    Muscle aches; affects liver function- Patient is currently taking   Novolog Mix [Insulin Aspart Prot & Aspart] Other (See Comments)    Causes skin to swell/itch at injection site   Family History  Problem Relation Age of Onset   Cancer Mother        intestinal    Heart disease Mother    Cancer Father        prostate   Migraines Daughter    Leukemia Sister    Heart disease Sister    Prostate cancer Other    Kidney cancer Other    Cancer Other        Bladder cancer   Coronary artery disease Other    Hypertension Sister    Hypertension Brother    Heart attack Neg Hx    Stroke Neg Hx     PE: BP 118/60 (BP Location: Left Arm, Patient Position: Sitting, Cuff Size: Normal)   Pulse 65   Ht _0  (1.778 m)   Wt  233 lb 12.8 oz (106.1 kg)   SpO2 98%   BMI 33.55 kg/m   Wt Readings  from Last 3 Encounters:  06/30/22 233 lb 12.8 oz (106.1 kg)  02/19/22 235 lb (106.6 kg)  02/18/22 231 lb 6.4 oz (105 kg)   Constitutional: overweight, in NAD Eyes:  EOMI, no exophthalmos ENT: no neck masses, no cervical lymphadenopathy Cardiovascular: RRR, No MRG Respiratory: CTA B Musculoskeletal: no deformities Skin:+ rash R leg where he dropped scalding water last year -thin skin, no bleeding Neurological: + tremor with outstretched hands  Diabetic Foot Exam - Simple   Simple Foot Form Diabetic Foot exam was performed with the following findings: Yes 06/30/2022  2:54 PM  Visual Inspection See comments: Yes Sensation Testing See comments: Yes Pulse Check Posterior Tibialis and Dorsalis pulse intact bilaterally: Yes Comments No sensation to monofilament. Onychodystrophy in all toes. Thick skin.    ASSESSMENT: 1. DM2, insulin-dependent, uncontrolled, with complications - CAD, s/p stents - He had stenting of distal RCA/PDA in 07/2013. Prior stenting of LAD in 04/2013. 2 new stents: PCI to left circumflex and the mid RCA on 07/30/2016. - AAA - CKD stage 3  2. HL  3.  Obesity class II BMI Classification: < 18.5 underweight  18.5-24.9 normal weight  25.0-29.9 overweight  30.0-34.9 class I obesity  35.0-39.9 class II obesity  ? 40.0 class III obesity   PLAN:  1. Patient with history of uncontrolled type 2 diabetes, on basal/bolus insulin regimen with NPH and regular insulin due to price, lower blood sugars at last visit, even dropping to the 40s and 50s in the morning.  I did advise him to decrease both of his NPH doses at that time, but did not change the regular insulin doses.  He was varying the dose of regular insulin based on the size of his meals and also blood sugars before the meals based on the recommended correction.  He continues to do a good job with this.  HbA1c at last visit was 6.5%,  improved. -At today's visit, sugars remain mainly at goal.  No significant lows or significant hyperglycemic spikes.  He does have few values in the low 200s, but the vast majority of the blood sugars are at goal.  For now, we discussed about continuing the current regimen.  I advised her to try to use the higher doses of regular insulin before larger meals, especially with the holidays coming up. - I advised him to: Patient Instructions  Please use the following regimen: Insulin Before breakfast Before lunch Before dinner  Regular 25-30 25-30 25-30  NPH 34  34   If the sugars before the meal are: <80: skip the R insulin before that meal 80-100: take only 10 units of R insulin 101-120: take only 15 units of R insulin  Otherwise, take the full 30 units dose.  If you take dinnertime insulin after dinner, take 50% of the dose.  Please return in 4 months with your sugar log.   - we checked his HbA1c: 6.1% (improved) - advised to check sugars at different times of the day - 3x a day, rotating check times - advised for yearly eye exams >> he is UTD - return to clinic in 4 months  2. HL -We will reviewed latest lipid panel from 10/2021: LDL at goal, triglycerides high, HDL low: Lab Results  Component Value Date   CHOL 108 10/27/2021   HDL 23.30 (L) 10/27/2021   LDLCALC 45 10/27/2021   LDLDIRECT 57.0 11/21/2019   TRIG 199.0 (H) 10/27/2021   CHOLHDL 5 10/27/2021  -He is on Lipitor 40 mg daily  and fenofibrate 160 mg daily along with flaxseed oil.  These are tolerated well.  3.  Obesity class 2 -He continues to eat plenty of vegetables and fruit. -At the devious visit, he lost 10 pounds -Before last visit, he lost 18 pounds, lost 2 lbs more since last OV  [name], MD PhD [facility] Endocrinology

## 2022-07-01 ENCOUNTER — Other Ambulatory Visit: Payer: Self-pay | Admitting: Internal Medicine

## 2022-07-01 DIAGNOSIS — Z794 Long term (current) use of insulin: Secondary | ICD-10-CM

## 2022-07-08 DIAGNOSIS — Z6833 Body mass index (BMI) 33.0-33.9, adult: Secondary | ICD-10-CM | POA: Diagnosis not present

## 2022-07-08 DIAGNOSIS — M5416 Radiculopathy, lumbar region: Secondary | ICD-10-CM | POA: Diagnosis not present

## 2022-07-16 DIAGNOSIS — H903 Sensorineural hearing loss, bilateral: Secondary | ICD-10-CM | POA: Diagnosis not present

## 2022-07-16 DIAGNOSIS — C4441 Basal cell carcinoma of skin of scalp and neck: Secondary | ICD-10-CM | POA: Diagnosis not present

## 2022-07-16 DIAGNOSIS — L720 Epidermal cyst: Secondary | ICD-10-CM | POA: Diagnosis not present

## 2022-07-16 DIAGNOSIS — L821 Other seborrheic keratosis: Secondary | ICD-10-CM | POA: Diagnosis not present

## 2022-07-16 DIAGNOSIS — L570 Actinic keratosis: Secondary | ICD-10-CM | POA: Diagnosis not present

## 2022-07-16 DIAGNOSIS — H6123 Impacted cerumen, bilateral: Secondary | ICD-10-CM | POA: Diagnosis not present

## 2022-07-29 NOTE — Progress Notes (Deleted)
HPI: FU CAD. Pt has had previous PCI of his LAD. Patient had admission October 2017 with NSTEMI. He had PCI of the circumflex and RCA with drug-eluting stents. Cardiac catheterization repeated May 2018 because of dyspnea and chest pain. There was continued patency of stent segments in his RCA, left circumflex and LAD. Left ventricular end-diastolic pressure moderately elevated (23). Admitted with CHF and chest pain December 2019.  Enzymes negative. Echo December 2019 showed normal LV function and mild left atrial enlargement. Nuclear study December 2019 showed no ischemia or infarction. ABIs 3/21 normal.  Abdominal ultrasound January 2023 showed 3.1 cm abdominal aortic aneurysm.  Since last seen,   Current Outpatient Medications  Medication Sig Dispense Refill   acetaminophen (TYLENOL) 500 MG tablet Take 500 mg by mouth 2 (two) times daily.      alfuzosin (UROXATRAL) 10 MG 24 hr tablet Take 10 mg by mouth in the morning and at bedtime.     allopurinol (ZYLOPRIM) 300 MG tablet TAKE 1 TABLET EVERY DAY 90 tablet 1   amLODipine (NORVASC) 5 MG tablet TAKE 1 TABLET EVERY DAY 90 tablet 3   aspirin EC 81 MG tablet Take 81 mg by mouth daily.     atorvastatin (LIPITOR) 40 MG tablet TAKE 1 TABLET EVERY DAY 90 tablet 3   B Complex Vitamins (B COMPLEX-B12 PO) Take 1 tablet by mouth daily.      baclofen (LIORESAL) 10 MG tablet 1 tab po q hs prn  back spasms 60 each 0   Blood Glucose Monitoring Suppl (TRUE METRIX METER) w/Device KIT USE AS DIRECTED  TO TEST BLOOD GLUCOSE 1 kit 0   Chlorpheniramine Maleate (CHLOR-TRIMETON ALLERGY PO) Take 1 tablet by mouth in the morning and at bedtime.     Cholecalciferol (VITAMIN D3) 50 MCG (2000 UT) capsule Take 2,000 Units by mouth at bedtime.      doxycycline (VIBRA-TABS) 100 MG tablet Take 1 tablet (100 mg total) by mouth 2 (two) times daily. 20 tablet 0   doxycycline (VIBRA-TABS) 100 MG tablet Take 1 tablet (100 mg total) by mouth 2 (two) times daily. 6 tablet 0    DROPLET INSULIN SYRINGE 31G X 5/16" 1 ML MISC USE TO INJECT INSULIN 5 TIMES DAILY 500 each 0   famotidine (PEPCID) 40 MG tablet Take 1 tablet (40 mg total) by mouth at bedtime. 30 tablet 2   fenofibrate 160 MG tablet TAKE 1 TABLET AT BEDTIME 90 tablet 1   Ferrous Fumarate (HEMOCYTE) 324 (106 Fe) MG TABS tablet Take 1 tablet (106 mg of iron total) by mouth daily. 30 tablet 2   fluocinonide cream (LIDEX) 1.03 % Apply 1 application topically daily as needed (for rashes).      fluticasone (FLONASE) 50 MCG/ACT nasal spray Place 2 sprays into both nostrils daily. (Patient taking differently: Place 2 sprays into both nostrils daily as needed for allergies.) 16 g 1   furosemide (LASIX) 40 MG tablet TAKE 2 TABLETS (80 MG) DAILY. 180 tablet 3   HYDROcodone-acetaminophen (NORCO) 5-325 MG tablet Take 0.5-1 tablets by mouth 2 (two) times daily as needed for moderate pain. 60 tablet 0   Insulin Syringe-Needle U-100 (BD INSULIN SYRINGE U/F) 31G X 5/16" 0.3 ML MISC Use to inject insulin 5 times a day. 500 each 11   isosorbide mononitrate (IMDUR) 30 MG 24 hr tablet Take 1 tablet (30 mg total) by mouth daily. 60 tablet 1   MAGNESIUM PO Take 1 tablet by mouth at bedtime.  Melatonin 10 MG TABS Take 10 mg by mouth at bedtime.     methocarbamol (ROBAXIN) 500 MG tablet Take 1 tablet (500 mg total) by mouth every 6 (six) hours as needed for muscle spasms. 30 tablet 3   metoprolol tartrate (LOPRESSOR) 25 MG tablet TAKE 1 TABLET TWICE DAILY 180 tablet 2   nitroGLYCERIN (NITROSTAT) 0.4 MG SL tablet Place 1 tablet (0.4 mg total) under the tongue every 5 (five) minutes as needed for chest pain. 100 tablet 3   NOVOLIN N 100 UNIT/ML injection INJECT 40 UNITS UNDER THE SKIN TWICE DAILY BEFORE MEALS 70 mL 2   NOVOLIN R 100 UNIT/ML injection INJECT 30 UNITS UNDER THE SKIN THREE TIMES DAILY BEFORE MEALS 80 mL 2   pantoprazole (PROTONIX) 20 MG tablet TAKE 1 TABLET EVERY DAY 90 tablet 3   Probiotic Product (PROBIOTIC PO) Take 1  capsule by mouth at bedtime.      silver sulfADIAZINE (SILVADENE) 1 % cream Apply 1 application. topically daily. 25 g 1   traMADol (ULTRAM) 50 MG tablet TAKE 1 TABLET BY MOUTH TWICE DAILY AS NEEDED FOR  MODERATE  OR  SEVERE  PAIN. 60 tablet 0   TRUE METRIX BLOOD GLUCOSE TEST test strip TEST BLOOD SUGAR THREE TIMES DAILY AS DIRECTED 300 strip 4   TRUEplus Lancets 33G MISC USE TO CHECK BLOOD SUGAR 3 TIMES A DAY 300 each 3   No current facility-administered medications for this visit.     Past Medical History:  Diagnosis Date   AAA (abdominal aortic aneurysm) ([place]) 12/15/2014   a. Mild aneurysmal dilatation of the infrarenal abdominal aorta 3.2 cm - f/u due by 2019.   Arthritis    "all over" (07/30/2016)   CAD (coronary artery disease)    a. stent to LAD 2000. b. possible spasm by cath 2001. c. IVUS/PTCA/DES to mLAD 05/2013. d. PTCA/DES of dRCA into ostial rPDA 07/2013. e. PTCA of OM2, DES to Cx, DES to [facility] 07/2016.   Chronic bronchitis (HCC)    Chronic diastolic CHF (congestive heart failure) (HCC)    Chronic lower back pain    Chronic neck pain    CKD (chronic kidney disease), stage III (HCC)    Diabetic peripheral neuropathy ([place])    Ejection fraction    55%, 07/2010, mild inferior hypo   GERD (gastroesophageal reflux disease)    Gout    H/O diverticulitis of colon 01/31/2015   H/O hiatal hernia    History of blood transfusion ~ 10/1940   "had pneumonia"   History of kidney stones    HTN (hypertension)    Hyperlipidemia    Melanoma of forearm, left ([place]) 02/2011   with wide excision    Myocardial infarction ([place])    Nephrolithiasis    Obesity    OSA on CPAP     [name] since 2000   Peripheral neuropathy    both feet   Positive D-dimer    a. significant elevation ,hospital 07/2010, etiology unclear. b. D Dimer chronically > 20.   Renal insufficiency    Skin cancer    Spinal stenosis    a. s/p surgical repair 2013   Spinal stenosis of lumbar region    Type II diabetes  mellitus ([place])    Ventral hernia     Past Surgical History:  Procedure Laterality Date   ANTERIOR LAT LUMBAR FUSION  10/22/2017   Procedure: Lumbar two-three Lumbar three-four Lumbar four-five Anteriolateral lumbar interbody fusion with percutaneous pedicle screw fixation and infuse;  Surgeon: [name], MD;  Location: [facility];  Service: Neurosurgery;;   APPLICATION OF ROBOTIC ASSISTANCE FOR SPINAL PROCEDURE  10/22/2017   Procedure: APPLICATION OF ROBOTIC ASSISTANCE FOR SPINAL PROCEDURE;  Surgeon: [name], MD;  Location: [facility];  Service: Neurosurgery;;   BACK SURGERY     CARDIAC CATHETERIZATION N/A 07/30/2016   Procedure: Left Heart Cath and Coronary Angiography;  Surgeon: Nelva Bush, MD;  Location: [facility] CV LAB;  Service: Cardiovascular;  Laterality: N/A;   CARDIAC CATHETERIZATION N/A 07/30/2016   Procedure: Coronary Stent Intervention;  Surgeon: Nelva Bush, MD;  Location: [facility] CV LAB;  Service: Cardiovascular;  Laterality: N/A;  Mid CFX and MID RCA   CARDIAC CATHETERIZATION N/A 07/30/2016   Procedure: Coronary Balloon Angioplasty;  Surgeon: Nelva Bush, MD;  Location: [facility] CV LAB;  Service: Cardiovascular;  Laterality: N/A;  OM 1   CARPAL TUNNEL RELEASE Left    CATARACT EXTRACTION W/ INTRAOCULAR LENS  IMPLANT, BILATERAL Bilateral ~ 2014   CERVICAL DISC SURGERY  1990s   CORONARY ANGIOPLASTY WITH STENT PLACEMENT  05/2013   CORONARY ANGIOPLASTY WITH STENT PLACEMENT  07/26/2013   DES to RCA extending to PDA     CORONARY ANGIOPLASTY WITH STENT PLACEMENT  07/30/2016   CORONARY ANGIOPLASTY WITH STENT PLACEMENT  2000   CAD   DENTAL SURGERY  04/2016   "got infected; had to dig it out"   EYE SURGERY     KNEE ARTHROSCOPY Right 1990's   rt   LEFT AND RIGHT HEART CATHETERIZATION WITH CORONARY ANGIOGRAM N/A 07/26/2013   Procedure: LEFT AND RIGHT HEART CATHETERIZATION WITH CORONARY ANGIOGRAM;  Surgeon: [name], MD;  Location: [facility] CATH LAB;  Service:  Cardiovascular;  Laterality: N/A;   LEFT HEART CATH AND CORONARY ANGIOGRAPHY N/A 02/26/2017   Procedure: Left Heart Cath and Coronary Angiography;  Surgeon: Sherren Mocha, MD;  Location: [facility] CV LAB;  Service: Cardiovascular;  Laterality: N/A;   LEFT HEART CATHETERIZATION WITH CORONARY ANGIOGRAM N/A 05/19/2013   Procedure: LEFT HEART CATHETERIZATION WITH CORONARY ANGIOGRAM;  Surgeon: [name], MD;  Location: [facility] CATH LAB;  Service: Cardiovascular;  Laterality: N/A;   LUMBAR LAMINECTOMY/DECOMPRESSION MICRODISCECTOMY  08/30/2012   Procedure: LUMBAR LAMINECTOMY/DECOMPRESSION MICRODISCECTOMY 2 LEVELS;  Surgeon: [name], MD;  Location: [facility];  Service: Neurosurgery;  Laterality: Bilateral;  Bilateral Lumbar three-four Lumbar four-five Laminotomies   LUMBAR PERCUTANEOUS PEDICLE SCREW 1 LEVEL Bilateral 10/22/2017   Procedure: LUMBAR PERCUTANEOUS PEDICLE SCREW 1 LEVEL;  Surgeon: [name], MD;  Location: [facility];  Service: Neurosurgery;  Laterality: Bilateral;   LUMBAR SPINE SURGERY  04/07/2016   Dr. Ellene Route; "?ruptured disc"   MELANOMA EXCISION Left 02/2011   forearm   NASAL SINUS SURGERY  1970s   "cut windows in sinus pockets"   PERCUTANEOUS CORONARY STENT INTERVENTION (PCI-S)  05/19/2013   Procedure: PERCUTANEOUS CORONARY STENT INTERVENTION (PCI-S);  Surgeon: [name], MD;  Location: [facility] CATH LAB;  Service: Cardiovascular;;   ULNAR TUNNEL RELEASE Left 04/07/2016   Dr. Ellene Route    Social History   Socioeconomic History   Marital status: Married    Spouse name: Not on file   Number of children: Not on file   Years of education: Not on file   Highest education level: Not on file  Occupational History   Occupation: Painter  Tobacco Use   Smoking status: Former    Packs/day: 3.00    Years: 30.00    Total pack years: 90.00    Types:  Cigarettes    Quit date: 09/17/1996    Years since quitting: 25.8   Smokeless tobacco: Never  Vaping Use   Vaping Use: Never used   Substance and Sexual Activity   Alcohol use: No   Drug use: No   Sexual activity: Not on file  Other Topics Concern   Not on file  Social History Narrative   Married and lives locally with his wife.  Sharyon Cable.   Social Determinants of Health   Financial Resource Strain: Low Risk  (08/29/2021)   Overall Financial Resource Strain (CARDIA)    Difficulty of Paying Living Expenses: Not hard at all  Food Insecurity: Not on file  Transportation Needs: No Transportation Needs (06/11/2021)   PRAPARE - Hydrologist (Medical): No    Lack of Transportation (Non-Medical): No  Physical Activity: Inactive (12/01/2021)   Exercise Vital Sign    Days of Exercise per Week: 0 days    Minutes of Exercise per Session: 0 min  Stress: Not on file  Social Connections: Not on file  Intimate Partner Violence: Not on file    Family History  Problem Relation Age of Onset   Cancer Mother        intestinal    Heart disease Mother    Cancer Father        prostate   Migraines Daughter    Leukemia Sister    Heart disease Sister    Prostate cancer Other    Kidney cancer Other    Cancer Other        Bladder cancer   Coronary artery disease Other    Hypertension Sister    Hypertension Brother    Heart attack Neg Hx    Stroke Neg Hx     ROS: no fevers or chills, productive cough, hemoptysis, dysphasia, odynophagia, melena, hematochezia, dysuria, hematuria, rash, seizure activity, orthopnea, PND, pedal edema, claudication. Remaining systems are negative.  Physical Exam: Well-developed well-nourished in no acute distress.  Skin is warm and dry.  HEENT is normal.  Neck is supple.  Chest is clear to auscultation with normal expansion.  Cardiovascular exam is regular rate and rhythm.  Abdominal exam nontender or distended. No masses palpated. Extremities show no edema. neuro grossly intact  ECG- personally reviewed  A/P  1 coronary artery disease-patient denies chest  pain.  Plan to continue medical therapy with aspirin and statin.  2 hypertension-blood pressure controlled.  Continue present medical regimen.  3 hyperlipidemia-continue statin.  4 abdominal aortic aneurysm-patient is due for follow-up ultrasound January 2024.  5 chronic diastolic congestive heart failure-patient remains euvolemic on examination.  Continue diuretic.  Kirk Ruths, MD

## 2022-08-04 ENCOUNTER — Other Ambulatory Visit (HOSPITAL_COMMUNITY): Payer: Self-pay | Admitting: Neurological Surgery

## 2022-08-04 ENCOUNTER — Other Ambulatory Visit: Payer: Self-pay | Admitting: Neurological Surgery

## 2022-08-04 DIAGNOSIS — M5416 Radiculopathy, lumbar region: Secondary | ICD-10-CM

## 2022-08-12 ENCOUNTER — Ambulatory Visit (HOSPITAL_COMMUNITY)
Admission: RE | Admit: 2022-08-12 | Discharge: 2022-08-12 | Disposition: A | Discharge status: Home / Self Care | Payer: Medicare HMO | Source: Ambulatory Visit | Attending: Neurological Surgery | Admitting: Neurological Surgery

## 2022-08-12 ENCOUNTER — Other Ambulatory Visit: Payer: Self-pay

## 2022-08-12 ENCOUNTER — Ambulatory Visit: Payer: Medicare HMO | Admitting: Cardiology

## 2022-08-12 DIAGNOSIS — Z981 Arthrodesis status: Secondary | ICD-10-CM | POA: Diagnosis not present

## 2022-08-12 DIAGNOSIS — M545 Low back pain, unspecified: Secondary | ICD-10-CM | POA: Diagnosis not present

## 2022-08-12 DIAGNOSIS — M5416 Radiculopathy, lumbar region: Secondary | ICD-10-CM | POA: Insufficient documentation

## 2022-08-12 DIAGNOSIS — M47816 Spondylosis without myelopathy or radiculopathy, lumbar region: Secondary | ICD-10-CM | POA: Diagnosis not present

## 2022-08-12 DIAGNOSIS — M4726 Other spondylosis with radiculopathy, lumbar region: Secondary | ICD-10-CM | POA: Insufficient documentation

## 2022-08-12 DIAGNOSIS — M48061 Spinal stenosis, lumbar region without neurogenic claudication: Secondary | ICD-10-CM | POA: Insufficient documentation

## 2022-08-12 LAB — GLUCOSE, CAPILLARY: Glucose-Capillary: 183 mg/dL — ABNORMAL HIGH (ref 70–99)

## 2022-08-12 MED ORDER — OXYCODONE-ACETAMINOPHEN 5-325 MG PO TABS
ORAL_TABLET | ORAL | Status: AC
  Filled 2022-08-12: qty 2

## 2022-08-12 MED ORDER — OXYCODONE-ACETAMINOPHEN 5-325 MG PO TABS
1.0000 | ORAL_TABLET | ORAL | Status: DC | PRN
  Administered 2022-08-12: 2 via ORAL

## 2022-08-12 MED ORDER — OXYCODONE-ACETAMINOPHEN 5-325 MG PO TABS: 1.0000 | ORAL_TABLET | ORAL | Status: DC | PRN

## 2022-08-12 MED ORDER — IOHEXOL 180 MG/ML  SOLN
20.0000 mL | Freq: Once | INTRAMUSCULAR | Status: AC | PRN
  Administered 2022-08-12: 12 mL via INTRATHECAL

## 2022-08-12 MED ORDER — LIDOCAINE HCL (PF) 1 % IJ SOLN
5.0000 mL | Freq: Once | INTRAMUSCULAR | Status: AC
  Administered 2022-08-12: 1 mL via INTRADERMAL

## 2022-08-12 MED ORDER — DIAZEPAM 5 MG PO TABS
ORAL_TABLET | ORAL | Status: AC
  Administered 2022-08-12: 10 mg via ORAL
  Filled 2022-08-12: qty 2

## 2022-08-12 MED ORDER — DIAZEPAM 5 MG PO TABS
10.0000 mg | ORAL_TABLET | Freq: Once | ORAL | Status: AC
  Filled 2022-08-12: qty 2

## 2022-08-12 MED ORDER — ONDANSETRON HCL 4 MG/2ML IJ SOLN: 4.0000 mg | Freq: Four times a day (QID) | INTRAMUSCULAR | Status: DC | PRN

## 2022-08-12 NOTE — Procedures (Signed)
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  He has had worsening back pain and continues to have significant leg weakness.  Concern is that he has some adjacent level disease above his fusion and or at the thoracolumbar junction.  Because of the presence of all the metal in his back I advised a myelogram and postmyelogram CAT scan to better visualize the spine he is now to undergo a lumbar myelogram.  Pre op Dx: Lumbar stenosis with radiculopathy status post arthrodesis L2 to sacrum. Post op Dx: Same Procedure: Lumbar myelogram Surgeon: Albertus Chiarelli Puncture level: L1-2 Fluid color: Clear colorless Injection: Iohexol 180, 12 mL Findings: No blockage to the flow of dye across the thoracolumbar junction further evaluation with CT scanning in all fused areas.

## 2022-08-14 DIAGNOSIS — M5416 Radiculopathy, lumbar region: Secondary | ICD-10-CM | POA: Diagnosis not present

## 2022-08-14 DIAGNOSIS — Z6833 Body mass index (BMI) 33.0-33.9, adult: Secondary | ICD-10-CM | POA: Diagnosis not present

## 2022-08-18 DIAGNOSIS — D044 Carcinoma in situ of skin of scalp and neck: Secondary | ICD-10-CM | POA: Diagnosis not present

## 2022-08-18 DIAGNOSIS — L720 Epidermal cyst: Secondary | ICD-10-CM | POA: Diagnosis not present

## 2022-08-18 DIAGNOSIS — L570 Actinic keratosis: Secondary | ICD-10-CM | POA: Diagnosis not present

## 2022-08-19 ENCOUNTER — Other Ambulatory Visit: Payer: Self-pay | Admitting: Family Medicine

## 2022-09-10 ENCOUNTER — Encounter: Payer: Self-pay | Admitting: Nurse Practitioner

## 2022-09-15 DIAGNOSIS — L821 Other seborrheic keratosis: Secondary | ICD-10-CM | POA: Diagnosis not present

## 2022-09-15 DIAGNOSIS — L570 Actinic keratosis: Secondary | ICD-10-CM | POA: Diagnosis not present

## 2022-09-16 DIAGNOSIS — M5416 Radiculopathy, lumbar region: Secondary | ICD-10-CM | POA: Diagnosis not present

## 2022-09-28 ENCOUNTER — Other Ambulatory Visit: Payer: Self-pay | Admitting: *Deleted

## 2022-09-28 DIAGNOSIS — I714 Abdominal aortic aneurysm, without rupture, unspecified: Secondary | ICD-10-CM

## 2022-10-10 ENCOUNTER — Other Ambulatory Visit: Payer: Self-pay | Admitting: Cardiology

## 2022-10-10 DIAGNOSIS — R0609 Other forms of dyspnea: Secondary | ICD-10-CM

## 2022-10-16 ENCOUNTER — Ambulatory Visit (HOSPITAL_COMMUNITY)
Admission: RE | Admit: 2022-10-16 | Discharge: 2022-10-16 | Disposition: A | Discharge status: Home / Self Care | Payer: Medicare HMO | Source: Ambulatory Visit | Attending: Cardiology | Admitting: Cardiology

## 2022-10-16 DIAGNOSIS — I7143 Infrarenal abdominal aortic aneurysm, without rupture: Secondary | ICD-10-CM | POA: Insufficient documentation

## 2022-10-16 DIAGNOSIS — I714 Abdominal aortic aneurysm, without rupture, unspecified: Secondary | ICD-10-CM | POA: Diagnosis not present

## 2022-10-19 ENCOUNTER — Encounter: Payer: Self-pay | Admitting: *Deleted

## 2022-10-21 ENCOUNTER — Encounter: Payer: Medicare HMO | Admitting: Gastroenterology

## 2022-10-21 ENCOUNTER — Encounter: Payer: Self-pay | Admitting: Nurse Practitioner

## 2022-10-21 ENCOUNTER — Ambulatory Visit (INDEPENDENT_AMBULATORY_CARE_PROVIDER_SITE_OTHER): Payer: Medicare HMO | Admitting: Nurse Practitioner

## 2022-10-21 ENCOUNTER — Other Ambulatory Visit (INDEPENDENT_AMBULATORY_CARE_PROVIDER_SITE_OTHER): Payer: Medicare HMO

## 2022-10-21 VITALS — BP 130/68 | HR 65 | Ht 70.0 in | Wt 236.4 lb

## 2022-10-21 DIAGNOSIS — R195 Other fecal abnormalities: Secondary | ICD-10-CM | POA: Diagnosis not present

## 2022-10-21 DIAGNOSIS — K635 Polyp of colon: Secondary | ICD-10-CM

## 2022-10-21 LAB — IBC + FERRITIN
Ferritin: 47.1 ng/mL (ref 22.0–322.0)
Iron: 56 ug/dL (ref 42–165)
Saturation Ratios: 11.8 % — ABNORMAL LOW (ref 20.0–50.0)
TIBC: 474.6 ug/dL — ABNORMAL HIGH (ref 250.0–450.0)
Transferrin: 339 mg/dL (ref 212.0–360.0)

## 2022-10-21 LAB — COMPREHENSIVE METABOLIC PANEL
ALT: 17 U/L (ref 0–53)
AST: 21 U/L (ref 0–37)
Albumin: 4.2 g/dL (ref 3.5–5.2)
Alkaline Phosphatase: 57 U/L (ref 39–117)
BUN: 37 mg/dL — ABNORMAL HIGH (ref 6–23)
CO2: 29 mEq/L (ref 19–32)
Calcium: 9.8 mg/dL (ref 8.4–10.5)
Chloride: 106 mEq/L (ref 96–112)
Creatinine, Ser: 2.2 mg/dL — ABNORMAL HIGH (ref 0.40–1.50)
GFR: 27.19 mL/min — ABNORMAL LOW (ref >60.00)
Glucose, Bld: 119 mg/dL — ABNORMAL HIGH (ref 70–99)
Potassium: 4 mEq/L (ref 3.5–5.1)
Sodium: 144 mEq/L (ref 135–145)
Total Bilirubin: 0.3 mg/dL (ref 0.2–1.2)
Total Protein: 7.1 g/dL (ref 6.0–8.3)

## 2022-10-21 LAB — CBC
HCT: 36.5 % — ABNORMAL LOW (ref 39.0–52.0)
Hemoglobin: 12.4 g/dL — ABNORMAL LOW (ref 13.0–17.0)
MCHC: 34.1 g/dL (ref 30.0–36.0)
MCV: 93.4 fl (ref 78.0–100.0)
Platelets: 134 10*3/uL — ABNORMAL LOW (ref 150.0–400.0)
RBC: 3.9 Mil/uL — ABNORMAL LOW (ref 4.22–5.81)
RDW: 15.5 % (ref 11.5–15.5)
WBC: 4.9 10*3/uL (ref 4.0–10.5)

## 2022-10-21 LAB — B12 AND FOLATE PANEL
Folate: 20.6 ng/mL (ref >5.9)
Vitamin B-12: 330 pg/mL (ref 211–911)

## 2022-10-21 NOTE — Progress Notes (Signed)
Attending Physician's Attestation   I have reviewed the chart.   I agree with the Advanced Practitioner's note, impression, and recommendations with any updates as below. Agree, makes most sense to evaluate and see what the iron levels turn out to be based on the patient's desires and current status.  If significant iron deficiency is found, then if patient wants to minimize endoscopic evaluation first then aggressive oral and IV iron repletion and monitoring of blood counts and iron studies can be pursued.  If patient had persistent iron deficiency found after these maneuvers/treatments and then endoscopic evaluation from above and below would need to be considered.  Justice Britain, MD [facility] Advanced Endoscopy Office # [phone]

## 2022-10-21 NOTE — Patient Instructions (Addendum)
Your provider has requested that you go to the basement level for lab work before leaving today. Press "B" on the elevator. The lab is located at the first door on the left as you exit the elevator.   Due to recent changes in healthcare laws, you may see the results of your imaging and laboratory studies on MyChart before your provider has had a chance to review them.  We understand that in some cases there may be results that are confusing or concerning to you. Not all laboratory results come back in the same time frame and the provider may be waiting for multiple results in order to interpret others.  Please give [place] 48 hours in order for your provider to thoroughly review all the results before contacting the office for clarification of your results.    Thank you for trusting me with your gastrointestinal care!   Carl Best, CRNP
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
## 2022-10-28 DIAGNOSIS — Z6833 Body mass index (BMI) 33.0-33.9, adult: Secondary | ICD-10-CM | POA: Diagnosis not present

## 2022-10-28 DIAGNOSIS — M5416 Radiculopathy, lumbar region: Secondary | ICD-10-CM | POA: Diagnosis not present

## 2022-10-30 ENCOUNTER — Other Ambulatory Visit: Payer: Self-pay

## 2022-10-30 DIAGNOSIS — R195 Other fecal abnormalities: Secondary | ICD-10-CM

## 2022-10-30 DIAGNOSIS — D509 Iron deficiency anemia, unspecified: Secondary | ICD-10-CM

## 2022-10-30 MED ORDER — FERROUS SULFATE 325 (65 FE) MG PO TBEC: 325.0000 mg | DELAYED_RELEASE_TABLET | Freq: Every day | ORAL | 0 refills | Status: DC

## 2022-11-03 ENCOUNTER — Encounter: Payer: Self-pay | Admitting: Internal Medicine

## 2022-11-03 ENCOUNTER — Ambulatory Visit (INDEPENDENT_AMBULATORY_CARE_PROVIDER_SITE_OTHER): Payer: Medicare HMO | Admitting: Internal Medicine

## 2022-11-03 VITALS — BP 118/62 | HR 64 | Ht 70.0 in | Wt 236.0 lb

## 2022-11-03 DIAGNOSIS — E1159 Type 2 diabetes mellitus with other circulatory complications: Secondary | ICD-10-CM

## 2022-11-03 DIAGNOSIS — Z794 Long term (current) use of insulin: Secondary | ICD-10-CM | POA: Diagnosis not present

## 2022-11-03 DIAGNOSIS — E785 Hyperlipidemia, unspecified: Secondary | ICD-10-CM | POA: Diagnosis not present

## 2022-11-03 DIAGNOSIS — Z6837 Body mass index (BMI) 37.0-37.9, adult: Secondary | ICD-10-CM

## 2022-11-03 LAB — POCT GLYCOSYLATED HEMOGLOBIN (HGB A1C): Hemoglobin A1C: 6.4 % — AB (ref 4.0–5.6)

## 2022-11-03 NOTE — Progress Notes (Signed)
Patient ID: William Hendrix, male   DOB: 06-Jan-1941, 82 y.o.   MRN: [id number] .  HPI: William Hendrix is a 82 y.o.-year-old male, returning for f/u for DM2, dx 2009, insulin-dependent, uncontrolled, with complications (CAD s/p AMI 2017, CKD stage 3). Last visit 4 months ago.  Interim history: No increased urination, blurry vision, nausea, chest pain.  He continues to have back pain and has difficulty walking. He is busy with his wife who had PNA + PE, also a TIA. He had dietary indiscretions since last OV He was found to have IDA >> started Fe pills.  Reviewed HbA1c levels: Lab Results  Component Value Date   HGBA1C 6.1 (A) 06/30/2022   HGBA1C 6.5 (A) 02/19/2022   HGBA1C 7.1 (H) 09/09/2021   HGBA1C 6.0 (A) 05/27/2021   HGBA1C 6.7 (A) 01/14/2021   HGBA1C 6.2 (A) 09/10/2020   HGBA1C 6.7 (A) 04/30/2020   HGBA1C 8.0 (H) 11/21/2019   HGBA1C 7.2 (H) 08/14/2019   HGBA1C 6.7 (H) 05/11/2019   HGBA1C 6.3 (A) 04/27/2019   HGBA1C 6.8 (H) 12/15/2018   HGBA1C 6.1 (A) 05/10/2018   HGBA1C 5.7 01/06/2018   HGBA1C 6.3 09/09/2017   HGBA1C 7.3 06/09/2017   HGBA1C 7.5 03/02/2017   HGBA1C 7.7 11/26/2016   HGBA1C 6.5 08/28/2016   HGBA1C 7.0 05/28/2016   He is on: ReliOn insulin: Insulin Before breakfast Before lunch Before dinner  Regular 25-30 25-30 25-30  NPH 40 >> 34  40 >> 34  If the sugars before the meal are: <80: skip the R insulin before that meal 80-100: take only 10 units of R insulin 101-120: take only 15 units of R insulin  Otherwise, take the full 30 units dose.  We cannot use Metformin 2/2 CKD. We stopped Januvia >> could not afford ($100/3 mo)  -We cannot use Levemir >> developed a severe skin reaction We stopped Amaryl 4 mg. - He checks sugars 3 times a day per review of his log: - am:  62, 72-149, 163, 295 >> 57-167 >> 74-154, 161 >> 55, 80-130 - Before lunch: 69-177, 198 >> 115-139, 146 >> n/c - before dinner: 48, 69, 92-186, 214 >> 97-189, 210  >> 130-160 - Before  bedtime: 127-173, 207 >> n/c  >> 101 >> n/c >> up to 190 - nighttime: 44 x1  Lowest sugar was 44 at night >> 48 >> 74 >> 55 x1 (? Reason); it is unclear at which level he has hypoglycemia awareness. Highest sugar was 326 >> .Marland Kitchen.214 >> 210 >> 210.  In 2018, he eliminated meat almost completely and his sugars improved significantly while he also lost weight.  He continues to eat many vegetables, but he reintroduced meat, bacon, eggs and sugars started to increase.  + CKD, last BUN/creatinine was:  Lab Results  Component Value Date   BUN 37 (H) 10/21/2022   CREATININE 2.20 (H) 10/21/2022   ACR levels were normal: Lab Results  Component Value Date   MICRALBCREAT 1.8 10/27/2021   MICRALBCREAT 5.1 08/02/2014   MICRALBCREAT 0.7 02/23/2013   Reviewed GFR levels: Lab Results  Component Value Date   GFRAA 50 (L) 08/28/2020   GFRAA 48 (L) 01/09/2020   GFRAA 49 (L) 12/13/2019   GFRAA 48 (L) 04/22/2019   GFRAA 44 (L) 12/29/2018   GFRAA 39 (L) 11/12/2018   GFRAA 32 (L) 11/11/2018   GFRAA 44 (L) 09/16/2018   GFRAA 43 (L) 09/15/2018   GFRAA 46 (L) 08/21/2018   + HL; latest lipids: Lab  Results  Component Value Date   CHOL 108 10/27/2021   HDL 23.30 (L) 10/27/2021   LDLCALC 45 10/27/2021   LDLDIRECT 57.0 11/21/2019   TRIG 199.0 (H) 10/27/2021   CHOLHDL 5 10/27/2021  On Lipitor, fenofibrate, flaxseed oil.  Pt's last eye exam was 10/2021: No DR reportedly. Had cataract sx in 2011. Coming up next month.  + numbness and tingling in his legs.  Last foot exam 06/30/2022.  He had back surgery in 03/2016 and 10/2017.  He had lumbar fusion surgery on 09/11/2021. Total: 9 back surgeries. He was admitted 2x in 2017 for: CP/SOB >> AMI.  He had 2 stents placed then. He had a cardiac cath 02/2017 >> no new blockages He was seen emergency room with colitis 04/22/2019.    PMH: Pt also also has a history of CAD, HTN, CKD stage III, history of nephrolithiasis, hiatal hernia, OSA, obesity, melanoma,  spinal stenosis.   ROS: + see HPI Musculoskeletal: + muscle aches/+ joint aches (back)  I reviewed pt's medications, allergies, PMH, social hx, family hx, and changes were documented in the history of present illness. Otherwise, unchanged from my initial visit note.  Past Medical History:  Diagnosis Date   AAA (abdominal aortic aneurysm) ([place]) 12/15/2014   a. Mild aneurysmal dilatation of the infrarenal abdominal aorta 3.2 cm - f/u due by 2019.   Arthritis    "all over" (07/30/2016)   CAD (coronary artery disease)    a. stent to LAD 2000. b. possible spasm by cath 2001. c. IVUS/PTCA/DES to mLAD 05/2013. d. PTCA/DES of dRCA into ostial rPDA 07/2013. e. PTCA of OM2, DES to Cx, DES to [facility] 07/2016.   Chronic bronchitis (HCC)    Chronic diastolic CHF (congestive heart failure) (HCC)    Chronic lower back pain    Chronic neck pain    CKD (chronic kidney disease), stage III (HCC)    Diabetic peripheral neuropathy ([place])    Ejection fraction    55%, 07/2010, mild inferior hypo   GERD (gastroesophageal reflux disease)    Gout    H/O diverticulitis of colon 01/31/2015   H/O hiatal hernia    History of blood transfusion ~ 10/1940   "had pneumonia"   History of kidney stones    HTN (hypertension)    Hyperlipidemia    Melanoma of forearm, left ([place]) 02/2011   with wide excision    Myocardial infarction ([place])    Nephrolithiasis    Obesity    OSA on CPAP     [name] since 2000   Peripheral neuropathy    both feet   Positive D-dimer    a. significant elevation ,hospital 07/2010, etiology unclear. b. D Dimer chronically > 20.   Renal insufficiency    Skin cancer    Spinal stenosis    a. s/p surgical repair 2013   Spinal stenosis of lumbar region    Type II diabetes mellitus ([place])    Ventral hernia    Past Surgical History:  Procedure Laterality Date   ANTERIOR LAT LUMBAR FUSION  10/22/2017   Procedure: Lumbar two-three Lumbar three-four Lumbar four-five Anteriolateral lumbar  interbody fusion with percutaneous pedicle screw fixation and infuse;  Surgeon: [name], MD;  Location: [facility];  Service: Neurosurgery;;   APPLICATION OF ROBOTIC ASSISTANCE FOR SPINAL PROCEDURE  10/22/2017   Procedure: APPLICATION OF ROBOTIC ASSISTANCE FOR SPINAL PROCEDURE;  Surgeon: [name], MD;  Location: [facility];  Service: Neurosurgery;;   [place]  CATHETERIZATION N/A 07/30/2016   Procedure: Left Heart Cath and Coronary Angiography;  Surgeon: Nelva Bush, MD;  Location: [place] CV LAB;  Service: Cardiovascular;  Laterality: N/A;   CARDIAC CATHETERIZATION N/A 07/30/2016   Procedure: Coronary Stent Intervention;  Surgeon: Nelva Bush, MD;  Location: [place] CV LAB;  Service: Cardiovascular;  Laterality: N/A;  Mid CFX and MID RCA   CARDIAC CATHETERIZATION N/A 07/30/2016   Procedure: Coronary Balloon Angioplasty;  Surgeon: Nelva Bush, MD;  Location: [facility] CV LAB;  Service: Cardiovascular;  Laterality: N/A;  OM 1   CARPAL TUNNEL RELEASE Left    CATARACT EXTRACTION W/ INTRAOCULAR LENS  IMPLANT, BILATERAL Bilateral ~ 2014   CERVICAL DISC SURGERY  1990s   CORONARY ANGIOPLASTY WITH STENT PLACEMENT  05/2013   CORONARY ANGIOPLASTY WITH STENT PLACEMENT  07/26/2013   DES to RCA extending to PDA     CORONARY ANGIOPLASTY WITH STENT PLACEMENT  07/30/2016   CORONARY ANGIOPLASTY WITH STENT PLACEMENT  2000   CAD   DENTAL SURGERY  04/2016   "got infected; had to dig it out"   EYE SURGERY     KNEE ARTHROSCOPY Right 1990's   rt   LEFT AND RIGHT HEART CATHETERIZATION WITH CORONARY ANGIOGRAM N/A 07/26/2013   Procedure: LEFT AND RIGHT HEART CATHETERIZATION WITH CORONARY ANGIOGRAM;  Surgeon: [name], MD;  Location: [facility] CATH LAB;  Service: Cardiovascular;  Laterality: N/A;   LEFT HEART CATH AND CORONARY ANGIOGRAPHY N/A 02/26/2017   Procedure: Left Heart Cath and Coronary Angiography;  Surgeon: Sherren Mocha, MD;  Location: [facility] CV LAB;  Service:  Cardiovascular;  Laterality: N/A;   LEFT HEART CATHETERIZATION WITH CORONARY ANGIOGRAM N/A 05/19/2013   Procedure: LEFT HEART CATHETERIZATION WITH CORONARY ANGIOGRAM;  Surgeon: [name], MD;  Location: [facility] CATH LAB;  Service: Cardiovascular;  Laterality: N/A;   LUMBAR LAMINECTOMY/DECOMPRESSION MICRODISCECTOMY  08/30/2012   Procedure: LUMBAR LAMINECTOMY/DECOMPRESSION MICRODISCECTOMY 2 LEVELS;  Surgeon: [name], MD;  Location: [place] NEURO ORS;  Service: Neurosurgery;  Laterality: Bilateral;  Bilateral Lumbar three-four Lumbar four-five Laminotomies   LUMBAR PERCUTANEOUS PEDICLE SCREW 1 LEVEL Bilateral 10/22/2017   Procedure: LUMBAR PERCUTANEOUS PEDICLE SCREW 1 LEVEL;  Surgeon: [name], MD;  Location: [facility];  Service: Neurosurgery;  Laterality: Bilateral;   LUMBAR SPINE SURGERY  04/07/2016   Dr. Ellene Route; "?ruptured disc"   MELANOMA EXCISION Left 02/2011   forearm   NASAL SINUS SURGERY  1970s   "cut windows in sinus pockets"   PERCUTANEOUS CORONARY STENT INTERVENTION (PCI-S)  05/19/2013   Procedure: PERCUTANEOUS CORONARY STENT INTERVENTION (PCI-S);  Surgeon: [name], MD;  Location: [facility] CATH LAB;  Service: Cardiovascular;;   ULNAR TUNNEL RELEASE Left 04/07/2016   Dr. Ellene Route   Social History   Socioeconomic History   Marital status: Married    Spouse name: Not on file   Number of children: Not on file   Years of education: Not on file   Highest education level: Not on file  Occupational History   Occupation: Curator  Tobacco Use   Smoking status: Former    Packs/day: 3.00    Years: 30.00    Total pack years: 90.00    Types: Cigarettes    Quit date: 09/17/1996    Years since quitting: 26.1   Smokeless tobacco: Never  Vaping Use   Vaping Use: Never used  Substance and Sexual Activity   Alcohol use: No   Drug use: No   Sexual activity: Not on file  Other Topics Concern  Not on file  Social History Narrative   Married and lives locally with his wife.  Sharyon Cable.    Social Determinants of Health   Financial Resource Strain: Low Risk  (08/29/2021)   Overall Financial Resource Strain (CARDIA)    Difficulty of Paying Living Expenses: Not hard at all  Food Insecurity: Not on file  Transportation Needs: No Transportation Needs (06/11/2021)   PRAPARE - Hydrologist (Medical): No    Lack of Transportation (Non-Medical): No  Physical Activity: Inactive (12/01/2021)   Exercise Vital Sign    Days of Exercise per Week: 0 days    Minutes of Exercise per Session: 0 min  Stress: Not on file  Social Connections: Not on file  Intimate Partner Violence: Not on file   Current Outpatient Medications on File Prior to Visit  Medication Sig Dispense Refill   acetaminophen (TYLENOL) 500 MG tablet Take 500 mg by mouth 2 (two) times daily.      alfuzosin (UROXATRAL) 10 MG 24 hr tablet Take 10 mg by mouth in the morning and at bedtime.     allopurinol (ZYLOPRIM) 300 MG tablet TAKE 1 TABLET EVERY DAY 90 tablet 10   amLODipine (NORVASC) 5 MG tablet TAKE 1 TABLET EVERY DAY 90 tablet 3   aspirin EC 81 MG tablet Take 81 mg by mouth daily.     atorvastatin (LIPITOR) 40 MG tablet TAKE 1 TABLET EVERY DAY 90 tablet 3   B Complex Vitamins (B COMPLEX-B12 PO) Take 1 tablet by mouth daily.      Blood Glucose Monitoring Suppl (TRUE METRIX METER) w/Device KIT USE AS DIRECTED  TO TEST BLOOD GLUCOSE 1 kit 0   Chlorpheniramine Maleate (CHLOR-TRIMETON ALLERGY PO) Take 1 tablet by mouth in the morning and at bedtime.     Cholecalciferol (VITAMIN D3) 50 MCG (2000 UT) capsule Take 2,000 Units by mouth at bedtime.      DROPLET INSULIN SYRINGE 31G X 5/16" 1 ML MISC USE TO INJECT INSULIN 5 TIMES DAILY 500 each 0   fenofibrate 160 MG tablet TAKE 1 TABLET AT BEDTIME 90 tablet 10   ferrous sulfate 325 (65 FE) MG EC tablet Take 1 tablet (325 mg total) by mouth daily with breakfast. 90 tablet 0   fluocinonide cream (LIDEX) 8.26 % Apply 1 application topically daily as  needed (for rashes).      furosemide (LASIX) 40 MG tablet TAKE 2 TABLETS (80 MG) DAILY. 180 tablet 3   Insulin Syringe-Needle U-100 (BD INSULIN SYRINGE U/F) 31G X 5/16" 0.3 ML MISC Use to inject insulin 5 times a day. 500 each 11   isosorbide mononitrate (IMDUR) 30 MG 24 hr tablet Take 1 tablet (30 mg total) by mouth daily. 60 tablet 1   MAGNESIUM PO Take 1 tablet by mouth at bedtime.     Melatonin 10 MG TABS Take 10 mg by mouth at bedtime.     methocarbamol (ROBAXIN) 500 MG tablet Take 1 tablet (500 mg total) by mouth every 6 (six) hours as needed for muscle spasms. 30 tablet 3   metoprolol tartrate (LOPRESSOR) 25 MG tablet TAKE 1 TABLET TWICE DAILY 180 tablet 2   nitroGLYCERIN (NITROSTAT) 0.4 MG SL tablet Place 1 tablet (0.4 mg total) under the tongue every 5 (five) minutes as needed for chest pain. 100 tablet 3   NOVOLIN N 100 UNIT/ML injection INJECT 40 UNITS UNDER THE SKIN TWICE DAILY BEFORE MEALS (Patient taking differently: Inject 34 Units into the skin 2 (  two) times daily.) 70 mL 2   NOVOLIN R 100 UNIT/ML injection INJECT 30 UNITS UNDER THE SKIN THREE TIMES DAILY BEFORE MEALS (Patient taking differently: Inject 15-30 Units into the skin 3 (three) times daily before meals. Per sliding scale) 80 mL 2   pantoprazole (PROTONIX) 20 MG tablet TAKE 1 TABLET EVERY DAY 90 tablet 3   Probiotic Product (PROBIOTIC PO) Take 1 capsule by mouth at bedtime.      traMADol (ULTRAM) 50 MG tablet TAKE 1 TABLET BY MOUTH TWICE DAILY AS NEEDED FOR  MODERATE  OR  SEVERE  PAIN. 60 tablet 0   TRUE METRIX BLOOD GLUCOSE TEST test strip TEST BLOOD SUGAR THREE TIMES DAILY AS DIRECTED 300 strip 4   TRUEplus Lancets 33G MISC USE TO CHECK BLOOD SUGAR 3 TIMES A DAY 300 each 3   No current facility-administered medications on file prior to visit.   Allergies  Allergen Reactions   Bee Venom Anaphylaxis   Brilinta [Ticagrelor] Shortness Of Breath   Levemir [Insulin Detemir] Itching, Swelling and Other (See Comments)     Patient had redness and swelling and tenderness at injection site.    Codeine Other (See Comments)    Makes him "shakey", is OK with hydrocodone GI UPSET & TREMORS     Gadobenate     Other reaction(s): fever, sweats and nausea   Insulin Aspart Itching and Swelling    Novolog    Lipitor [Atorvastatin] Other (See Comments)    Muscle aches; affects liver function- Patient is currently taking 40 mg    Novolog Mix [Insulin Aspart Prot & Aspart] Other (See Comments)    Causes skin to swell/itch at injection site   Family History  Problem Relation Age of Onset   Cancer Mother        intestinal    Heart disease Mother    Cancer Father        prostate   Migraines Daughter    Leukemia Sister    Heart disease Sister    Prostate cancer Other    Kidney cancer Other    Cancer Other        Bladder cancer   Coronary artery disease Other    Hypertension Sister    Hypertension Brother    Heart attack Neg Hx    Stroke Neg Hx    PE: BP 118/62 (BP Location: Left Arm, Patient Position: Sitting, Cuff Size: Normal)   Pulse 64   Ht '5\' 10"'$  (1.778 m)   Wt 236 lb (107 kg)   SpO2 94%   BMI 33.86 kg/m   Wt Readings from Last 3 Encounters:  11/03/22 236 lb (107 kg)  10/21/22 236 lb 7 oz (107.2 kg)  08/12/22 233 lb (105.7 kg)   Constitutional: overweight, in NAD Eyes:  EOMI, no exophthalmos ENT: no neck masses, no cervical lymphadenopathy Cardiovascular: RRR, No MRG Respiratory: CTA B Musculoskeletal: no deformities Skin:+ rash R leg where he dropped scalding water last year -thin skin, no bleeding Neurological: + tremor with outstretched hands  ASSESSMENT: 1. DM2, insulin-dependent, uncontrolled, with complications - CAD, s/p stents - He had stenting of distal RCA/PDA in 07/2013. Prior stenting of LAD in 04/2013. 2 new stents: PCI to left circumflex and the mid RCA on 07/30/2016. - AAA - CKD stage 3  2. HL  3.  Obesity class II BMI Classification: < 18.5 underweight  18.5-24.9  normal weight  25.0-29.9 overweight  30.0-34.9 class I obesity  35.0-39.9 class II obesity  ? 40.0  class III obesity   PLAN:  1. Patient with history of uncontrolled type 2 diabetes, on basal/bolus insulin regimen with NPH and regular insulin due to price.  At last visit, sugars were mainly at goal without significant lows or hyperglycemic spikes.  He did have a few values in the low 200s but the vast majority of the blood sugars were at goal.  We did not change his regimen.  I did advise him to try to use the higher doses of regular insulin before larger meals, especially with the holidays coming up.  HbA1c at that time was at goal, at 6.1%, improved. -At today's visit, reviewing his blood sugars at home, they are mostly at goal.  They appear to be slightly higher before dinner, likely due to the proximity of the previous meals/snacking.  He had 1 low blood sugar in the morning, I cannot explain why this happened.  Since this was just 1 incident, I did not recommend a change in insulin dosing.  However, he also describes episodes in which she starts shaking (did not check blood sugars) after he took his insulin in the morning and drove to a restaurant to have breakfast.  He mentions that when he reaches there, he starts shaking and asks the server for issues.  We discussed to never take the insulin before leaving home, but take the insulin with him and injecting it only after he reaches the restaurant.  He agrees with this plan. - I advised him to: Patient Instructions  Please use the following regimen: Insulin Before breakfast Before lunch Before dinner  Regular 25-30 25-30 25-30  NPH 34  34   If the sugars before the meal are: <80: skip the R insulin before that meal 80-100: take only 10 units of R insulin 101-120: take only 15 units of R insulin  Otherwise, take the full 30 units dose.  If you take dinnertime insulin after dinner, take 50% of the dose.  Please return in 4 months with your  sugar log.   - we checked his HbA1c: 6.4% (slightly higher) - advised to check sugars at different times of the day - 3x a day, rotating check times - advised for yearly eye exams >> he is UTD - return to clinic in 4 months  2. HL -Reviewed latest lipid panel from 10/2021: LDL at goal, electrolytes high, HDL low: Lab Results  Component Value Date   CHOL 108 10/27/2021   HDL 23.30 (L) 10/27/2021   LDLCALC 45 10/27/2021   LDLDIRECT 57.0 11/21/2019   TRIG 199.0 (H) 10/27/2021   CHOLHDL 5 10/27/2021  -He is on Lipitor 40 mg daily and fenofibrate 160 mg daily along with flaxseed oil.  He tolerates these well. -He is due for another lipid panel -appointment with PCP coming up next month  3.  Obesity class 2 -He continues to eat plenty of vegetables and fruit -Before the last 2 visits combined he lost 20 pounds -He gained 3 pounds since last visit  [name], MD PhD [facility] Endocrinology

## 2022-11-03 NOTE — Patient Instructions (Addendum)
Please use the following regimen: Insulin Before breakfast Before lunch Before dinner  Regular 25-30 25-30 25-30  NPH 34  34   If the sugars before the meal are: <80: skip the R insulin before that meal 80-100: take only 10 units of R insulin 101-120: take only 15 units of R insulin  Otherwise, take the full 30 units dose.  If you take dinnertime insulin after dinner, take 50% of the dose.  Take the insulin with you and only inject it when you are in the restaurant.  Please return in 4 months with your sugar log.

## 2022-11-05 DIAGNOSIS — R3911 Hesitancy of micturition: Secondary | ICD-10-CM | POA: Diagnosis not present

## 2022-11-05 DIAGNOSIS — N401 Enlarged prostate with lower urinary tract symptoms: Secondary | ICD-10-CM | POA: Diagnosis not present

## 2022-11-05 DIAGNOSIS — R3912 Poor urinary stream: Secondary | ICD-10-CM | POA: Diagnosis not present

## 2022-11-24 ENCOUNTER — Telehealth: Payer: Self-pay

## 2022-11-24 NOTE — Patient Outreach (Signed)
  Care Coordination   11/24/2022 Name: William Hendrix MRN: [id number] DOB: July 05, 1941   Care Coordination Outreach Attempts:  A second unsuccessful outreach was attempted today to offer the patient with information about available care coordination services as a benefit of their health plan.     Follow Up Plan:  Additional outreach attempts will be made to offer the patient care coordination information and services.   Encounter Outcome:  No Answer   Care Coordination Interventions:  No, not indicated    Thea Silversmith, RN, MSN, BSN, [place] Coordinator [phone]

## 2022-12-01 ENCOUNTER — Other Ambulatory Visit: Payer: Self-pay | Admitting: Cardiology

## 2022-12-01 DIAGNOSIS — R072 Precordial pain: Secondary | ICD-10-CM

## 2022-12-08 ENCOUNTER — Ambulatory Visit (INDEPENDENT_AMBULATORY_CARE_PROVIDER_SITE_OTHER): Payer: Medicare HMO | Admitting: Family Medicine

## 2022-12-08 VITALS — BP 122/60 | HR 72 | Temp 97.5°F | Resp 16 | Ht 70.0 in | Wt 234.5 lb

## 2022-12-08 DIAGNOSIS — N183 Chronic kidney disease, stage 3 unspecified: Secondary | ICD-10-CM

## 2022-12-08 DIAGNOSIS — I10 Essential (primary) hypertension: Secondary | ICD-10-CM

## 2022-12-08 DIAGNOSIS — T300 Burn of unspecified body region, unspecified degree: Secondary | ICD-10-CM | POA: Diagnosis not present

## 2022-12-08 DIAGNOSIS — R252 Cramp and spasm: Secondary | ICD-10-CM | POA: Diagnosis not present

## 2022-12-08 DIAGNOSIS — E118 Type 2 diabetes mellitus with unspecified complications: Secondary | ICD-10-CM

## 2022-12-08 DIAGNOSIS — E785 Hyperlipidemia, unspecified: Secondary | ICD-10-CM

## 2022-12-08 DIAGNOSIS — X12XXXA Contact with other hot fluids, initial encounter: Secondary | ICD-10-CM

## 2022-12-08 DIAGNOSIS — I5032 Chronic diastolic (congestive) heart failure: Secondary | ICD-10-CM

## 2022-12-08 DIAGNOSIS — Z23 Encounter for immunization: Secondary | ICD-10-CM

## 2022-12-08 DIAGNOSIS — M1000 Idiopathic gout, unspecified site: Secondary | ICD-10-CM

## 2022-12-08 DIAGNOSIS — Z794 Long term (current) use of insulin: Secondary | ICD-10-CM

## 2022-12-08 LAB — CBC WITH DIFFERENTIAL/PLATELET
Basophils Absolute: 0 10*3/uL (ref 0.0–0.1)
Basophils Relative: 0.5 % (ref 0.0–3.0)
Eosinophils Absolute: 0.1 10*3/uL (ref 0.0–0.7)
Eosinophils Relative: 1.8 % (ref 0.0–5.0)
HCT: 36.1 % — ABNORMAL LOW (ref 39.0–52.0)
Hemoglobin: 12 g/dL — ABNORMAL LOW (ref 13.0–17.0)
Lymphocytes Relative: 29.2 % (ref 12.0–46.0)
Lymphs Abs: 1.7 10*3/uL (ref 0.7–4.0)
MCHC: 33.4 g/dL (ref 30.0–36.0)
MCV: 93.2 fl (ref 78.0–100.0)
Monocytes Absolute: 0.4 10*3/uL (ref 0.1–1.0)
Monocytes Relative: 6.2 % (ref 3.0–12.0)
Neutro Abs: 3.5 10*3/uL (ref 1.4–7.7)
Neutrophils Relative %: 62.3 % (ref 43.0–77.0)
Platelets: 155 10*3/uL (ref 150.0–400.0)
RBC: 3.87 Mil/uL — ABNORMAL LOW (ref 4.22–5.81)
RDW: 14.9 % (ref 11.5–15.5)
WBC: 5.7 10*3/uL (ref 4.0–10.5)

## 2022-12-08 LAB — COMPREHENSIVE METABOLIC PANEL
ALT: 14 U/L (ref 0–53)
AST: 18 U/L (ref 0–37)
Albumin: 3.9 g/dL (ref 3.5–5.2)
Alkaline Phosphatase: 55 U/L (ref 39–117)
BUN: 38 mg/dL — ABNORMAL HIGH (ref 6–23)
CO2: 29 mEq/L (ref 19–32)
Calcium: 10.1 mg/dL (ref 8.4–10.5)
Chloride: 104 mEq/L (ref 96–112)
Creatinine, Ser: 1.96 mg/dL — ABNORMAL HIGH (ref 0.40–1.50)
GFR: 31.21 mL/min — ABNORMAL LOW (ref >60.00)
Glucose, Bld: 142 mg/dL — ABNORMAL HIGH (ref 70–99)
Potassium: 4.1 mEq/L (ref 3.5–5.1)
Sodium: 141 mEq/L (ref 135–145)
Total Bilirubin: 0.4 mg/dL (ref 0.2–1.2)
Total Protein: 6.8 g/dL (ref 6.0–8.3)

## 2022-12-08 LAB — LIPID PANEL
Cholesterol: 107 mg/dL (ref 0–200)
HDL: 22.4 mg/dL — ABNORMAL LOW (ref >39.00)
NonHDL: 84.69
Total CHOL/HDL Ratio: 5
Triglycerides: 220 mg/dL — ABNORMAL HIGH (ref 0.0–149.0)
VLDL: 44 mg/dL — ABNORMAL HIGH (ref 0.0–40.0)

## 2022-12-08 LAB — LDL CHOLESTEROL, DIRECT: Direct LDL: 58 mg/dL

## 2022-12-08 LAB — MAGNESIUM: Magnesium: 1.8 mg/dL (ref 1.5–2.5)

## 2022-12-08 LAB — TSH: TSH: 3.7 u[IU]/mL (ref 0.35–5.50)

## 2022-12-08 LAB — URIC ACID: Uric Acid, Serum: 4.7 mg/dL (ref 4.0–7.8)

## 2022-12-08 NOTE — Progress Notes (Signed)
Subjective:   By signing my name below, I, William Hendrix, attest that this documentation has been prepared under the direction and in the presence of [name], MD., 12/08/2022.   Patient ID: William Hendrix, male    DOB: 25-Feb-1941, 82 y.o.   MRN: [id number]  No chief complaint on file.  HPI Patient is in today for an office visit. He denies CP/palpitations/SOB/HA/congestion/ fever/chills/GI or GU symptoms.  Depression/Stress Patient states that his wife has pulmonary fibrosis and is concerned about her health. He has been married 31 years and is afraid his wife might not make it to their next anniversary. He also cares for his daughter.  Immunizations He is interested in receiving a Flu vaccination today.  Radiculopathy of Lumbar Region Patient continues seeing neurosurgeon Dr. Ellene Route to manage his radiculopathy of the lumbar region. Dr. Ellene Route has determined that surgery will not resolve the pain, but it can be managed with pain medications. He currently takes Tramadol 50 mg bid.  Past Medical History:  Diagnosis Date   AAA (abdominal aortic aneurysm) ([place]) 12/15/2014   a. Mild aneurysmal dilatation of the infrarenal abdominal aorta 3.2 cm - f/u due by 2019.   Arthritis    "all over" (07/30/2016)   CAD (coronary artery disease)    a. stent to LAD 2000. b. possible spasm by cath 2001. c. IVUS/PTCA/DES to mLAD 05/2013. d. PTCA/DES of dRCA into ostial rPDA 07/2013. e. PTCA of OM2, DES to Cx, DES to [facility] 07/2016.   Chronic bronchitis (HCC)    Chronic diastolic CHF (congestive heart failure) (HCC)    Chronic lower back pain    Chronic neck pain    CKD (chronic kidney disease), stage III (HCC)    Diabetic peripheral neuropathy ([place])    Ejection fraction    55%, 07/2010, mild inferior hypo   GERD (gastroesophageal reflux disease)    Gout    H/O diverticulitis of colon 01/31/2015   H/O hiatal hernia    History of blood transfusion ~ 10/1940   "had pneumonia"   History of  kidney stones    HTN (hypertension)    Hyperlipidemia    Melanoma of forearm, left ([place]) 02/2011   with wide excision    Myocardial infarction ([place])    Nephrolithiasis    Obesity    OSA on CPAP     [name] since 2000   Peripheral neuropathy    both feet   Positive D-dimer    a. significant elevation ,hospital 07/2010, etiology unclear. b. D Dimer chronically > 20.   Renal insufficiency    Skin cancer    Spinal stenosis    a. s/p surgical repair 2013   Spinal stenosis of lumbar region    Type II diabetes mellitus ([place])    Ventral hernia     Past Surgical History:  Procedure Laterality Date   ANTERIOR LAT LUMBAR FUSION  10/22/2017   Procedure: Lumbar two-three Lumbar three-four Lumbar four-five Anteriolateral lumbar interbody fusion with percutaneous pedicle screw fixation and infuse;  Surgeon: [name], MD;  Location: [place];  Service: Neurosurgery;;   APPLICATION OF ROBOTIC ASSISTANCE FOR SPINAL PROCEDURE  10/22/2017   Procedure: APPLICATION OF ROBOTIC ASSISTANCE FOR SPINAL PROCEDURE;  Surgeon: [name], MD;  Location: [place];  Service: Neurosurgery;;   BACK SURGERY     CARDIAC CATHETERIZATION N/A 07/30/2016   Procedure: Left Heart Cath and Coronary Angiography;  Surgeon: Nelva Bush, MD;  Location: [place] CV LAB;  Service: Cardiovascular;  Laterality: N/A;   CARDIAC CATHETERIZATION N/A 07/30/2016   Procedure: Coronary Stent Intervention;  Surgeon: Nelva Bush, MD;  Location: [place] CV LAB;  Service: Cardiovascular;  Laterality: N/A;  Mid CFX and MID RCA   CARDIAC CATHETERIZATION N/A 07/30/2016   Procedure: Coronary Balloon Angioplasty;  Surgeon: Nelva Bush, MD;  Location: [place] CV LAB;  Service: Cardiovascular;  Laterality: N/A;  OM 1   CARPAL TUNNEL RELEASE Left    CATARACT EXTRACTION W/ INTRAOCULAR LENS  IMPLANT, BILATERAL Bilateral ~ 2014   CERVICAL DISC SURGERY  1990s   CORONARY ANGIOPLASTY WITH STENT PLACEMENT  05/2013   CORONARY  ANGIOPLASTY WITH STENT PLACEMENT  07/26/2013   DES to RCA extending to PDA     CORONARY ANGIOPLASTY WITH STENT PLACEMENT  07/30/2016   CORONARY ANGIOPLASTY WITH STENT PLACEMENT  2000   CAD   DENTAL SURGERY  04/2016   "got infected; had to dig it out"   EYE SURGERY     KNEE ARTHROSCOPY Right 1990's   rt   LEFT AND RIGHT HEART CATHETERIZATION WITH CORONARY ANGIOGRAM N/A 07/26/2013   Procedure: LEFT AND RIGHT HEART CATHETERIZATION WITH CORONARY ANGIOGRAM;  Surgeon: [name], MD;  Location: [place] CATH LAB;  Service: Cardiovascular;  Laterality: N/A;   LEFT HEART CATH AND CORONARY ANGIOGRAPHY N/A 02/26/2017   Procedure: Left Heart Cath and Coronary Angiography;  Surgeon: Sherren Mocha, MD;  Location: [place] CV LAB;  Service: Cardiovascular;  Laterality: N/A;   LEFT HEART CATHETERIZATION WITH CORONARY ANGIOGRAM N/A 05/19/2013   Procedure: LEFT HEART CATHETERIZATION WITH CORONARY ANGIOGRAM;  Surgeon: [name], MD;  Location: [facility] CATH LAB;  Service: Cardiovascular;  Laterality: N/A;   LUMBAR LAMINECTOMY/DECOMPRESSION MICRODISCECTOMY  08/30/2012   Procedure: LUMBAR LAMINECTOMY/DECOMPRESSION MICRODISCECTOMY 2 LEVELS;  Surgeon: [name], MD;  Location: [place] NEURO ORS;  Service: Neurosurgery;  Laterality: Bilateral;  Bilateral Lumbar three-four Lumbar four-five Laminotomies   LUMBAR PERCUTANEOUS PEDICLE SCREW 1 LEVEL Bilateral 10/22/2017   Procedure: LUMBAR PERCUTANEOUS PEDICLE SCREW 1 LEVEL;  Surgeon: [name], MD;  Location: [facility];  Service: Neurosurgery;  Laterality: Bilateral;   LUMBAR SPINE SURGERY  04/07/2016   Dr. Ellene Route; "?ruptured disc"   MELANOMA EXCISION Left 02/2011   forearm   NASAL SINUS SURGERY  1970s   "cut windows in sinus pockets"   PERCUTANEOUS CORONARY STENT INTERVENTION (PCI-S)  05/19/2013   Procedure: PERCUTANEOUS CORONARY STENT INTERVENTION (PCI-S);  Surgeon: [name], MD;  Location: [facility] CATH LAB;  Service: Cardiovascular;;   ULNAR TUNNEL RELEASE Left  04/07/2016   Dr. Ellene Route    Family History  Problem Relation Age of Onset   Cancer Mother        intestinal    Heart disease Mother    Cancer Father        prostate   Migraines Daughter    Leukemia Sister    Heart disease Sister    Prostate cancer Other    Kidney cancer Other    Cancer Other        Bladder cancer   Coronary artery disease Other    Hypertension Sister    Hypertension Brother    Heart attack Neg Hx    Stroke Neg Hx     Social History   Socioeconomic History   Marital status: Married    Spouse name: Not on file   Number of children: Not on file   Years of education: Not on file   Highest education level: Not on file  Occupational  History   Occupation: Curator  Tobacco Use   Smoking status: Former    Packs/day: 3.00    Years: 30.00    Total pack years: 90.00    Types: Cigarettes    Quit date: 09/17/1996    Years since quitting: 26.2   Smokeless tobacco: Never  Vaping Use   Vaping Use: Never used  Substance and Sexual Activity   Alcohol use: No   Drug use: No   Sexual activity: Not on file  Other Topics Concern   Not on file  Social History Narrative   Married and lives locally with his wife.  Sharyon Cable.   Social Determinants of Health   Financial Resource Strain: Low Risk  (08/29/2021)   Overall Financial Resource Strain (CARDIA)    Difficulty of Paying Living Expenses: Not hard at all  Food Insecurity: Not on file  Transportation Needs: No Transportation Needs (06/11/2021)   PRAPARE - Hydrologist (Medical): No    Lack of Transportation (Non-Medical): No  Physical Activity: Inactive (12/01/2021)   Exercise Vital Sign    Days of Exercise per Week: 0 days    Minutes of Exercise per Session: 0 min  Stress: Not on file  Social Connections: Not on file  Intimate Partner Violence: Not on file    Outpatient Medications Prior to Visit  Medication Sig Dispense Refill   acetaminophen (TYLENOL) 500 MG tablet Take 500  mg by mouth 2 (two) times daily.      alfuzosin (UROXATRAL) 10 MG 24 hr tablet Take 10 mg by mouth in the morning and at bedtime.     allopurinol (ZYLOPRIM) 300 MG tablet TAKE 1 TABLET EVERY DAY 90 tablet 10   amLODipine (NORVASC) 5 MG tablet TAKE 1 TABLET EVERY DAY 90 tablet 3   aspirin EC 81 MG tablet Take 81 mg by mouth daily.     atorvastatin (LIPITOR) 40 MG tablet TAKE 1 TABLET EVERY DAY 90 tablet 3   B Complex Vitamins (B COMPLEX-B12 PO) Take 1 tablet by mouth daily.      Blood Glucose Monitoring Suppl (TRUE METRIX METER) w/Device KIT USE AS DIRECTED  TO TEST BLOOD GLUCOSE 1 kit 0   Chlorpheniramine Maleate (CHLOR-TRIMETON ALLERGY PO) Take 1 tablet by mouth in the morning and at bedtime.     Cholecalciferol (VITAMIN D3) 50 MCG (2000 UT) capsule Take 2,000 Units by mouth at bedtime.      DROPLET INSULIN SYRINGE 31G X 5/16" 1 ML MISC USE TO INJECT INSULIN 5 TIMES DAILY 500 each 0   fenofibrate 160 MG tablet TAKE 1 TABLET AT BEDTIME 90 tablet 10   ferrous sulfate 325 (65 FE) MG EC tablet Take 1 tablet (325 mg total) by mouth daily with breakfast. 90 tablet 0   fluocinonide cream (LIDEX) [id number] % Apply 1 application topically daily as needed (for rashes).      furosemide (LASIX) 40 MG tablet TAKE 2 TABLETS (80 MG) DAILY. 180 tablet 3   Insulin Syringe-Needle U-100 (BD INSULIN SYRINGE U/F) 31G X 5/16" 0.3 ML MISC Use to inject insulin 5 times a day. 500 each 11   isosorbide mononitrate (IMDUR) 30 MG 24 hr tablet TAKE 1 TABLET EVERY DAY 90 tablet 0   MAGNESIUM PO Take 1 tablet by mouth at bedtime.     Melatonin 10 MG TABS Take 10 mg by mouth at bedtime.     methocarbamol (ROBAXIN) 500 MG tablet Take 1 tablet (500 mg total) by mouth every  6 (six) hours as needed for muscle spasms. 30 tablet 3   metoprolol tartrate (LOPRESSOR) 25 MG tablet TAKE 1 TABLET TWICE DAILY 180 tablet 2   nitroGLYCERIN (NITROSTAT) 0.4 MG SL tablet Place 1 tablet (0.4 mg total) under the tongue every 5 (five) minutes as  needed for chest pain. 100 tablet 3   NOVOLIN N 100 UNIT/ML injection INJECT 40 UNITS UNDER THE SKIN TWICE DAILY BEFORE MEALS (Patient taking differently: Inject 34 Units into the skin 2 (two) times daily.) 70 mL 2   NOVOLIN R 100 UNIT/ML injection INJECT 30 UNITS UNDER THE SKIN THREE TIMES DAILY BEFORE MEALS (Patient taking differently: Inject 15-30 Units into the skin 3 (three) times daily before meals. Per sliding scale) 80 mL 2   pantoprazole (PROTONIX) 20 MG tablet TAKE 1 TABLET EVERY DAY 90 tablet 3   Probiotic Product (PROBIOTIC PO) Take 1 capsule by mouth at bedtime.      traMADol (ULTRAM) 50 MG tablet TAKE 1 TABLET BY MOUTH TWICE DAILY AS NEEDED FOR  MODERATE  OR  SEVERE  PAIN. 60 tablet 0   TRUE METRIX BLOOD GLUCOSE TEST test strip TEST BLOOD SUGAR THREE TIMES DAILY AS DIRECTED 300 strip 4   TRUEplus Lancets 33G MISC USE TO CHECK BLOOD SUGAR 3 TIMES A DAY 300 each 3   No facility-administered medications prior to visit.    Allergies  Allergen Reactions   Bee Venom Anaphylaxis   Brilinta [Ticagrelor] Shortness Of Breath   Levemir [Insulin Detemir] Itching, Swelling and Other (See Comments)    Patient had redness and swelling and tenderness at injection site.    Codeine Other (See Comments)    Makes him "shakey", is OK with hydrocodone GI UPSET & TREMORS     Gadobenate     Other reaction(s): fever, sweats and nausea   Insulin Aspart Itching and Swelling    Novolog    Lipitor [Atorvastatin] Other (See Comments)    Muscle aches; affects liver function- Patient is currently taking 40 mg    Novolog Mix [Insulin Aspart Prot & Aspart] Other (See Comments)    Causes skin to swell/itch at injection site    Review of Systems  Constitutional:  Negative for chills and fever.  HENT:  Negative for congestion.   Respiratory:  Negative for shortness of breath.   Cardiovascular:  Negative for chest pain and palpitations.  Gastrointestinal:  Negative for abdominal pain, blood in stool,  constipation, diarrhea, nausea and vomiting.  Genitourinary:  Negative for dysuria, frequency, hematuria and urgency.  Skin:           Neurological:  Negative for headaches.       Objective:    Physical Exam Constitutional:      General: He is not in acute distress.    Appearance: Normal appearance. He is normal weight. He is not ill-appearing.  HENT:     Head: Normocephalic and atraumatic.     Right Ear: External ear normal.     Left Ear: External ear normal.     Nose: Nose normal.     Mouth/Throat:     Mouth: Mucous membranes are moist.     Pharynx: Oropharynx is clear.  Eyes:     General:        Right eye: No discharge.        Left eye: No discharge.     Extraocular Movements: Extraocular movements intact.     Conjunctiva/sclera: Conjunctivae normal.     Pupils: Pupils are equal,  round, and reactive to light.  Cardiovascular:     Rate and Rhythm: Normal rate and regular rhythm.     Pulses: Normal pulses.     Heart sounds: Normal heart sounds. No murmur heard.    No gallop.  Pulmonary:     Effort: Pulmonary effort is normal. No respiratory distress.     Breath sounds: Normal breath sounds. No wheezing or rales.  Abdominal:     General: Bowel sounds are normal.     Palpations: Abdomen is soft.     Tenderness: There is no abdominal tenderness. There is no guarding.  Musculoskeletal:        General: Normal range of motion.     Cervical back: Normal range of motion.     Right lower leg: No edema.     Left lower leg: No edema.  Skin:    General: Skin is warm and dry.  Neurological:     Mental Status: He is alert and oriented to person, place, and time.  Psychiatric:        Mood and Affect: Mood normal.        Behavior: Behavior normal.        Judgment: Judgment normal.     There were no vitals taken for this visit. Wt Readings from Last 3 Encounters:  11/03/22 236 lb (107 kg)  10/21/22 236 lb 7 oz (107.2 kg)  08/12/22 233 lb (105.7 kg)    Diabetic Foot  Exam - Simple   No data filed    Lab Results  Component Value Date   WBC 4.9 10/21/2022   HGB 12.4 (L) 10/21/2022   HCT 36.5 (L) 10/21/2022   PLT 134.0 (L) 10/21/2022   GLUCOSE 119 (H) 10/21/2022   CHOL 108 10/27/2021   TRIG 199.0 (H) 10/27/2021   HDL 23.30 (L) 10/27/2021   LDLDIRECT 57.0 11/21/2019   LDLCALC 45 10/27/2021   ALT 17 10/21/2022   AST 21 10/21/2022   NA 144 10/21/2022   K 4.0 10/21/2022   CL 106 10/21/2022   CREATININE 2.20 (H) 10/21/2022   BUN 37 (H) 10/21/2022   CO2 29 10/21/2022   TSH 3.43 10/27/2021   PSA 1.01 11/02/2018   INR 1.1 02/24/2017   HGBA1C 6.4 (A) 11/03/2022   MICROALBUR <0.7 10/27/2021    Lab Results  Component Value Date   TSH 3.43 10/27/2021   Lab Results  Component Value Date   WBC 4.9 10/21/2022   HGB 12.4 (L) 10/21/2022   HCT 36.5 (L) 10/21/2022   MCV 93.4 10/21/2022   PLT 134.0 (L) 10/21/2022   Lab Results  Component Value Date   NA 144 10/21/2022   K 4.0 10/21/2022   CO2 29 10/21/2022   GLUCOSE 119 (H) 10/21/2022   BUN 37 (H) 10/21/2022   CREATININE 2.20 (H) 10/21/2022   BILITOT 0.3 10/21/2022   ALKPHOS 57 10/21/2022   AST 21 10/21/2022   ALT 17 10/21/2022   PROT 7.1 10/21/2022   ALBUMIN 4.2 10/21/2022   CALCIUM 9.8 10/21/2022   ANIONGAP 8 09/09/2021   EGFR 48 (L) 02/26/2021   GFR 27.19 (L) 10/21/2022   Lab Results  Component Value Date   CHOL 108 10/27/2021   Lab Results  Component Value Date   HDL 23.30 (L) 10/27/2021   Lab Results  Component Value Date   LDLCALC 45 10/27/2021   Lab Results  Component Value Date   TRIG 199.0 (H) 10/27/2021   Lab Results  Component Value Date  CHOLHDL 5 10/27/2021   Lab Results  Component Value Date   HGBA1C 6.4 (A) 11/03/2022      Assessment & Plan:  Immunizations: Reviewed patient's immunization history and encouraged RSV vaccination. Flu vaccination administered today. Problem List Items Addressed This Visit   None  No orders of the defined types were  placed in this encounter.  I, William Hendrix, personally preformed the services described in this documentation.  All medical record entries made by the scribe were at my direction and in my presence.  I have reviewed the chart and discharge instructions (if applicable) and agree that the record reflects my personal performance and is accurate and complete. 12/08/2022  I,Mohammed Iqbal,acting as a scribe for Penni Homans, MD.,have documented all relevant documentation on the behalf of Penni Homans, MD,as directed by  Penni Homans, MD while in the presence of Penni Homans, MD.  William Hendrix

## 2022-12-09 NOTE — Assessment & Plan Note (Signed)
Hydrate and monitor 

## 2022-12-09 NOTE — Assessment & Plan Note (Signed)
Well controlled, no changes to meds. Encouraged heart healthy diet such as the DASH diet and exercise as tolerated.  °

## 2022-12-09 NOTE — Assessment & Plan Note (Signed)
No recent exacerbation 

## 2022-12-09 NOTE — Assessment & Plan Note (Signed)
hgba1c acceptable, minimize simple carbs. Increase exercise as tolerated. Continue current meds 

## 2022-12-09 NOTE — Assessment & Plan Note (Signed)
Tolerating statin, encouraged heart healthy diet, avoid trans fats, minimize simple carbs and saturated fats. Increase exercise as tolerated 

## 2022-12-16 ENCOUNTER — Encounter: Payer: Self-pay | Admitting: Cardiology

## 2022-12-16 ENCOUNTER — Ambulatory Visit: Payer: Medicare HMO | Attending: Cardiology | Admitting: Cardiology

## 2022-12-16 ENCOUNTER — Telehealth: Payer: Self-pay

## 2022-12-16 VITALS — BP 114/52 | HR 66 | Ht 70.0 in | Wt 236.3 lb

## 2022-12-16 DIAGNOSIS — I2510 Atherosclerotic heart disease of native coronary artery without angina pectoris: Secondary | ICD-10-CM | POA: Diagnosis not present

## 2022-12-16 DIAGNOSIS — I714 Abdominal aortic aneurysm, without rupture, unspecified: Secondary | ICD-10-CM

## 2022-12-16 DIAGNOSIS — I10 Essential (primary) hypertension: Secondary | ICD-10-CM

## 2022-12-16 DIAGNOSIS — E7800 Pure hypercholesterolemia, unspecified: Secondary | ICD-10-CM | POA: Diagnosis not present

## 2022-12-16 MED ORDER — AMLODIPINE BESYLATE 2.5 MG PO TABS: 2.5000 mg | ORAL_TABLET | Freq: Every day | ORAL | 3 refills | Status: DC

## 2022-12-16 NOTE — Patient Outreach (Signed)
  Care Coordination   12/16/2022 Name: William Hendrix MRN: [id number] DOB: 1941/09/17   Care Coordination Outreach Attempts:  An unsuccessful telephone outreach was attempted today to offer the patient information about available care coordination services as a benefit of their health plan. [name] reports this is not a good time to talk (taking care of an emergency situation for his wife).  Follow Up Plan:  Additional outreach attempts will be made to offer the patient care coordination information and services.   Encounter Outcome:  Pt. Request to Call Back   Care Coordination Interventions:  No, not indicated    Thea Silversmith, RN, MSN, BSN, [place] Coordinator [phone]

## 2022-12-16 NOTE — Progress Notes (Signed)
HPI: FU CAD. Pt has had previous PCI of his LAD. Patient had admission October 2017 with NSTEMI. He had PCI of the circumflex and RCA with drug-eluting stents. Cardiac catheterization repeated May 2018 because of dyspnea and chest pain. There was continued patency of stent segments in his RCA, left circumflex and LAD. Left ventricular end-diastolic pressure moderately elevated (23). Echo December 2019 showed normal LV function and mild left atrial enlargement. Nuclear study December 2019 showed no ischemia or infarction. ABIs 3/21 normal.  Abdominal ultrasound January 2024 showed abnormal dilatation of the left common iliac artery and right common iliac artery and 3.2 cm abdominal aortic aneurysm.  Since last seen, he has some dyspnea on exertion.  No orthopnea, PND, chest pain or syncope.  Some dizziness with standing at times.  Current Outpatient Medications  Medication Sig Dispense Refill   acetaminophen (TYLENOL) 500 MG tablet Take 500 mg by mouth 2 (two) times daily.      alfuzosin (UROXATRAL) 10 MG 24 hr tablet Take 10 mg by mouth in the morning and at bedtime.     allopurinol (ZYLOPRIM) 300 MG tablet TAKE 1 TABLET EVERY DAY 90 tablet 10   amLODipine (NORVASC) 5 MG tablet TAKE 1 TABLET EVERY DAY 90 tablet 3   aspirin EC 81 MG tablet Take 81 mg by mouth daily.     atorvastatin (LIPITOR) 40 MG tablet TAKE 1 TABLET EVERY DAY 90 tablet 3   B Complex Vitamins (B COMPLEX-B12 PO) Take 1 tablet by mouth daily.      Blood Glucose Monitoring Suppl (TRUE METRIX METER) w/Device KIT USE AS DIRECTED  TO TEST BLOOD GLUCOSE 1 kit 0   Chlorpheniramine Maleate (CHLOR-TRIMETON ALLERGY PO) Take 1 tablet by mouth in the morning and at bedtime.     Cholecalciferol (VITAMIN D3) 50 MCG (2000 UT) capsule Take 2,000 Units by mouth at bedtime.      DROPLET INSULIN SYRINGE 31G X 5/16" 1 ML MISC USE TO INJECT INSULIN 5 TIMES DAILY 500 each 0   fenofibrate 160 MG tablet TAKE 1 TABLET AT BEDTIME 90 tablet 10    ferrous sulfate 325 (65 FE) MG EC tablet Take 1 tablet (325 mg total) by mouth daily with breakfast. 90 tablet 0   fluocinonide cream (LIDEX) [id number] % Apply 1 application topically daily as needed (for rashes).      furosemide (LASIX) 40 MG tablet TAKE 2 TABLETS (80 MG) DAILY. 180 tablet 3   gabapentin (NEURONTIN) 100 MG capsule Take 100 mg by mouth 3 (three) times daily.     insulin NPH Human (NOVOLIN N) 100 UNIT/ML injection Inject 34 Units into the skin 2 (two) times daily before a meal.     insulin regular (NOVOLIN R) 100 units/mL injection Inject into the skin 2 (two) times daily before a meal. If morning is under 100 takes 10 units if in 100 to 120 takes 15 units and takes 30 units at night     Insulin Syringe-Needle U-100 (BD INSULIN SYRINGE U/F) 31G X 5/16" 0.3 ML MISC Use to inject insulin 5 times a day. 500 each 11   isosorbide mononitrate (IMDUR) 30 MG 24 hr tablet TAKE 1 TABLET EVERY DAY 90 tablet 0   MAGNESIUM PO Take 1 tablet by mouth at bedtime.     Melatonin 10 MG TABS Take 10 mg by mouth at bedtime.     meloxicam (MOBIC) 15 MG tablet Take 15 mg by mouth daily.     methocarbamol (ROBAXIN)  500 MG tablet Take 1 tablet (500 mg total) by mouth every 6 (six) hours as needed for muscle spasms. 30 tablet 3   metoprolol tartrate (LOPRESSOR) 25 MG tablet TAKE 1 TABLET TWICE DAILY 180 tablet 2   nitroGLYCERIN (NITROSTAT) 0.4 MG SL tablet Place 1 tablet (0.4 mg total) under the tongue every 5 (five) minutes as needed for chest pain. 100 tablet 3   pantoprazole (PROTONIX) 20 MG tablet TAKE 1 TABLET EVERY DAY 90 tablet 3   Probiotic Product (PROBIOTIC PO) Take 1 capsule by mouth at bedtime.      traMADol (ULTRAM) 50 MG tablet TAKE 1 TABLET BY MOUTH TWICE DAILY AS NEEDED FOR  MODERATE  OR  SEVERE  PAIN. 60 tablet 0   TRUE METRIX BLOOD GLUCOSE TEST test strip TEST BLOOD SUGAR THREE TIMES DAILY AS DIRECTED 300 strip 4   TRUEplus Lancets 33G MISC USE TO CHECK BLOOD SUGAR 3 TIMES A DAY 300 each 3    No current facility-administered medications for this visit.     Past Medical History:  Diagnosis Date   AAA (abdominal aortic aneurysm) ([place]) 12/15/2014   a. Mild aneurysmal dilatation of the infrarenal abdominal aorta 3.2 cm - f/u due by 2019.   Arthritis    "all over" (07/30/2016)   CAD (coronary artery disease)    a. stent to LAD 2000. b. possible spasm by cath 2001. c. IVUS/PTCA/DES to mLAD 05/2013. d. PTCA/DES of dRCA into ostial rPDA 07/2013. e. PTCA of OM2, DES to Cx, DES to [facility] 07/2016.   Chronic bronchitis (HCC)    Chronic diastolic CHF (congestive heart failure) (HCC)    Chronic lower back pain    Chronic neck pain    CKD (chronic kidney disease), stage III (HCC)    Diabetic peripheral neuropathy ([place])    Ejection fraction    55%, 07/2010, mild inferior hypo   GERD (gastroesophageal reflux disease)    Gout    H/O diverticulitis of colon 01/31/2015   H/O hiatal hernia    History of blood transfusion ~ 10/1940   "had pneumonia"   History of kidney stones    HTN (hypertension)    Hyperlipidemia    Melanoma of forearm, left ([place]) 02/2011   with wide excision    Myocardial infarction ([place])    Nephrolithiasis    Obesity    OSA on CPAP     [name] since 2000   Peripheral neuropathy    both feet   Positive D-dimer    a. significant elevation ,hospital 07/2010, etiology unclear. b. D Dimer chronically > 20.   Renal insufficiency    Skin cancer    Spinal stenosis    a. s/p surgical repair 2013   Spinal stenosis of lumbar region    Type II diabetes mellitus ([place])    Ventral hernia     Past Surgical History:  Procedure Laterality Date   ANTERIOR LAT LUMBAR FUSION  10/22/2017   Procedure: Lumbar two-three Lumbar three-four Lumbar four-five Anteriolateral lumbar interbody fusion with percutaneous pedicle screw fixation and infuse;  Surgeon: [name], MD;  Location: [place];  Service: Neurosurgery;;   APPLICATION OF ROBOTIC ASSISTANCE FOR SPINAL PROCEDURE   10/22/2017   Procedure: APPLICATION OF ROBOTIC ASSISTANCE FOR SPINAL PROCEDURE;  Surgeon: [name], MD;  Location: [place];  Service: Neurosurgery;;   BACK SURGERY     CARDIAC CATHETERIZATION N/A 07/30/2016   Procedure: Left Heart Cath and Coronary Angiography;  Surgeon: Nelva Bush, MD;  Location: [facility]  INVASIVE CV LAB;  Service: Cardiovascular;  Laterality: N/A;   CARDIAC CATHETERIZATION N/A 07/30/2016   Procedure: Coronary Stent Intervention;  Surgeon: Nelva Bush, MD;  Location: [place] CV LAB;  Service: Cardiovascular;  Laterality: N/A;  Mid CFX and MID RCA   CARDIAC CATHETERIZATION N/A 07/30/2016   Procedure: Coronary Balloon Angioplasty;  Surgeon: Nelva Bush, MD;  Location: [place] CV LAB;  Service: Cardiovascular;  Laterality: N/A;  OM 1   CARPAL TUNNEL RELEASE Left    CATARACT EXTRACTION W/ INTRAOCULAR LENS  IMPLANT, BILATERAL Bilateral ~ 2014   CERVICAL DISC SURGERY  1990s   CORONARY ANGIOPLASTY WITH STENT PLACEMENT  05/2013   CORONARY ANGIOPLASTY WITH STENT PLACEMENT  07/26/2013   DES to RCA extending to PDA     CORONARY ANGIOPLASTY WITH STENT PLACEMENT  07/30/2016   CORONARY ANGIOPLASTY WITH STENT PLACEMENT  2000   CAD   DENTAL SURGERY  04/2016   "got infected; had to dig it out"   EYE SURGERY     KNEE ARTHROSCOPY Right 1990's   rt   LEFT AND RIGHT HEART CATHETERIZATION WITH CORONARY ANGIOGRAM N/A 07/26/2013   Procedure: LEFT AND RIGHT HEART CATHETERIZATION WITH CORONARY ANGIOGRAM;  Surgeon: [name], MD;  Location: [place] CATH LAB;  Service: Cardiovascular;  Laterality: N/A;   LEFT HEART CATH AND CORONARY ANGIOGRAPHY N/A 02/26/2017   Procedure: Left Heart Cath and Coronary Angiography;  Surgeon: Sherren Mocha, MD;  Location: [place] CV LAB;  Service: Cardiovascular;  Laterality: N/A;   LEFT HEART CATHETERIZATION WITH CORONARY ANGIOGRAM N/A 05/19/2013   Procedure: LEFT HEART CATHETERIZATION WITH CORONARY ANGIOGRAM;  Surgeon: [name], MD;   Location[facility] CATH LAB;  Service: Cardiovascular;  Laterality: N/A;   LUMBAR LAMINECTOMY/DECOMPRESSION MICRODISCECTOMY  08/30/2012   Procedure: LUMBAR LAMINECTOMY/DECOMPRESSION MICRODISCECTOMY 2 LEVELS;  Surgeon: [name], MD;  Location: [place] NEURO ORS;  Service: Neurosurgery;  Laterality: Bilateral;  Bilateral Lumbar three-four Lumbar four-five Laminotomies   LUMBAR PERCUTANEOUS PEDICLE SCREW 1 LEVEL Bilateral 10/22/2017   Procedure: LUMBAR PERCUTANEOUS PEDICLE SCREW 1 LEVEL;  Surgeon: [name], MD;  Location: [place];  Service: Neurosurgery;  Laterality: Bilateral;   LUMBAR SPINE SURGERY  04/07/2016   Dr. Ellene Route; "?ruptured disc"   MELANOMA EXCISION Left 02/2011   forearm   NASAL SINUS SURGERY  1970s   "cut windows in sinus pockets"   PERCUTANEOUS CORONARY STENT INTERVENTION (PCI-S)  05/19/2013   Procedure: PERCUTANEOUS CORONARY STENT INTERVENTION (PCI-S);  Surgeon: [name], MD;  Location[facility] CATH LAB;  Service: Cardiovascular;;   ULNAR TUNNEL RELEASE Left 04/07/2016   Dr. Ellene Route    Social History   Socioeconomic History   Marital status: Married    Spouse name: Not on file   Number of children: Not on file   Years of education: Not on file   Highest education level: Not on file  Occupational History   Occupation: Curator  Tobacco Use   Smoking status: Former    Packs/day: 3.00    Years: 30.00    Total pack years: 90.00    Types: Cigarettes    Quit date: 09/17/1996    Years since quitting: 26.2   Smokeless tobacco: Never  Vaping Use   Vaping Use: Never used  Substance and Sexual Activity   Alcohol use: No   Drug use: No   Sexual activity: Not on file  Other Topics Concern   Not on file  Social History Narrative   Married and lives locally with his wife.  Sharyon Cable.   Social Determinants of Health   Financial Resource Strain: Low Risk  (08/29/2021)   Overall Financial Resource Strain (CARDIA)    Difficulty of Paying Living Expenses: Not hard at all  Food  Insecurity: Not on file  Transportation Needs: No Transportation Needs (06/11/2021)   PRAPARE - Hydrologist (Medical): No    Lack of Transportation (Non-Medical): No  Physical Activity: Inactive (12/01/2021)   Exercise Vital Sign    Days of Exercise per Week: 0 days    Minutes of Exercise per Session: 0 min  Stress: Not on file  Social Connections: Not on file  Intimate Partner Violence: Not on file    Family History  Problem Relation Age of Onset   Cancer Mother        intestinal    Heart disease Mother    Cancer Father        prostate   Migraines Daughter    Leukemia Sister    Heart disease Sister    Prostate cancer Other    Kidney cancer Other    Cancer Other        Bladder cancer   Coronary artery disease Other    Hypertension Sister    Hypertension Brother    Heart attack Neg Hx    Stroke Neg Hx     ROS: no fevers or chills, productive cough, hemoptysis, dysphasia, odynophagia, melena, hematochezia, dysuria, hematuria, rash, seizure activity, orthopnea, PND, pedal edema, claudication. Remaining systems are negative.  Physical Exam: Well-developed well-nourished in no acute distress.  Skin is warm and dry.  HEENT is normal.  Neck is supple.  Chest is clear to auscultation with normal expansion.  Cardiovascular exam is regular rate and rhythm.  Abdominal exam nontender or distended. No masses palpated. Extremities show no edema. neuro grossly intact  ECG-normal sinus rhythm at a rate of 66, left anterior fascicular block.  Personally reviewed  A/P  1 coronary artery disease-patient denies chest pain.  Continue medical therapy with aspirin and statin.  2 abdominal aortic aneurysm-plan follow-up ultrasound January 2025.  3 hypertension-blood pressure is borderline and he is having some dizziness with standing.  Decrease amlodipine to 2.5 mg daily and follow.  4 hyperlipidemia-continue statin.  5 chronic diastolic congestive heart  failure-patient remains euvolemic on examination.  Continue diuretic at present dose.  Kirk Ruths, MD

## 2022-12-16 NOTE — Patient Instructions (Signed)
Medication Instructions:   DECREASE AMLODIPINE TO 2.5 MG ONCE DAILY=1/2 OF THE 5 MG TABLET ONCE DAILY  *If you need a refill on your cardiac medications before your next appointment, please call your pharmacy*  Follow-Up: At [facility], you and your health needs are our priority.  As part of our continuing mission to provide you with exceptional heart care, we have created designated Provider Care Teams.  These Care Teams include your primary Cardiologist (physician) and Advanced Practice Providers (APPs -  Physician Assistants and Nurse Practitioners) who all work together to provide you with the care you need, when you need it.  We recommend signing up for the patient portal called "MyChart".  Sign up information is provided on this After Visit Summary.  MyChart is used to connect with patients for Virtual Visits (Telemedicine).  Patients are able to view lab/test results, encounter notes, upcoming appointments, etc.  Non-urgent messages can be sent to your provider as well.   To learn more about what you can do with MyChart, go to [email or web address].    Your next appointment:   6 month(s)  Provider:   Kirk Ruths, MD

## 2023-01-27 ENCOUNTER — Telehealth: Payer: Self-pay

## 2023-01-27 NOTE — Telephone Encounter (Signed)
done

## 2023-02-10 ENCOUNTER — Ambulatory Visit: Payer: Medicare HMO | Admitting: Cardiology

## 2023-02-10 DEATH — deceased

## 2023-03-04 ENCOUNTER — Ambulatory Visit: Payer: Medicare HMO | Admitting: Internal Medicine

## 2023-04-06 ENCOUNTER — Ambulatory Visit: Payer: Medicare HMO | Admitting: Family Medicine

## 2023-06-16 ENCOUNTER — Ambulatory Visit: Payer: Medicare HMO | Admitting: Cardiology
